# Patient Record
Sex: Male | Born: 1955
Health system: Southern US, Community
[De-identification: ages and names within clinical notes are randomized; demographics above are authoritative.]

## PROBLEM LIST (undated history)

## (undated) DIAGNOSIS — I480 Paroxysmal atrial fibrillation: Secondary | ICD-10-CM

## (undated) DIAGNOSIS — I428 Other cardiomyopathies: Secondary | ICD-10-CM

## (undated) DIAGNOSIS — I4892 Unspecified atrial flutter: Secondary | ICD-10-CM

## (undated) DIAGNOSIS — IMO0002 Reserved for concepts with insufficient information to code with codable children: Secondary | ICD-10-CM

## (undated) DIAGNOSIS — E039 Hypothyroidism, unspecified: Secondary | ICD-10-CM

## (undated) DIAGNOSIS — C099 Malignant neoplasm of tonsil, unspecified: Secondary | ICD-10-CM

## (undated) DIAGNOSIS — Z9221 Personal history of antineoplastic chemotherapy: Secondary | ICD-10-CM

## (undated) DIAGNOSIS — IMO0001 Reserved for inherently not codable concepts without codable children: Secondary | ICD-10-CM

## (undated) DIAGNOSIS — I2510 Atherosclerotic heart disease of native coronary artery without angina pectoris: Secondary | ICD-10-CM

## (undated) DIAGNOSIS — K5720 Diverticulitis of large intestine with perforation and abscess without bleeding: Secondary | ICD-10-CM

## (undated) DIAGNOSIS — I6529 Occlusion and stenosis of unspecified carotid artery: Secondary | ICD-10-CM

## (undated) DIAGNOSIS — K5792 Diverticulitis of intestine, part unspecified, without perforation or abscess without bleeding: Secondary | ICD-10-CM

## (undated) HISTORY — DX: Diverticulitis of intestine, part unspecified, without perforation or abscess without bleeding: K57.92

## (undated) HISTORY — DX: Occlusion and stenosis of unspecified carotid artery: I65.29

## (undated) HISTORY — DX: Personal history of antineoplastic chemotherapy: Z92.21

## (undated) HISTORY — DX: Hypothyroidism, unspecified: E03.9

## (undated) HISTORY — DX: Malignant neoplasm of tonsil, unspecified: C09.9

## (undated) HISTORY — DX: Reserved for inherently not codable concepts without codable children: IMO0001

## (undated) HISTORY — DX: Unspecified atrial flutter: I48.92

## (undated) HISTORY — DX: Atherosclerotic heart disease of native coronary artery without angina pectoris: I25.10

## (undated) HISTORY — DX: Reserved for concepts with insufficient information to code with codable children: IMO0002

## (undated) HISTORY — PX: LAMINECTOMY: SHX219

## (undated) HISTORY — DX: Other cardiomyopathies: I42.8

---
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
## 1999-04-04 ENCOUNTER — Encounter: Payer: Self-pay | Admitting: Neurosurgery

## 1999-04-06 ENCOUNTER — Observation Stay (HOSPITAL_COMMUNITY): Admission: RE | Admit: 1999-04-06 | Discharge: 1999-04-07 | Payer: Self-pay | Admitting: Neurosurgery

## 1999-04-06 ENCOUNTER — Encounter: Payer: Self-pay | Admitting: Neurosurgery

## 1999-04-28 ENCOUNTER — Encounter: Payer: Self-pay | Admitting: Neurosurgery

## 1999-04-28 ENCOUNTER — Ambulatory Visit (HOSPITAL_COMMUNITY): Admission: RE | Admit: 1999-04-28 | Discharge: 1999-04-28 | Payer: Self-pay | Admitting: Neurosurgery

## 1999-05-31 ENCOUNTER — Encounter: Payer: Self-pay | Admitting: Neurosurgery

## 1999-05-31 ENCOUNTER — Encounter: Admission: RE | Admit: 1999-05-31 | Discharge: 1999-05-31 | Payer: Self-pay | Admitting: Neurosurgery

## 1999-12-16 ENCOUNTER — Encounter: Payer: Self-pay | Admitting: Neurosurgery

## 1999-12-16 ENCOUNTER — Encounter: Admission: RE | Admit: 1999-12-16 | Discharge: 1999-12-16 | Payer: Self-pay | Admitting: Neurosurgery

## 2001-08-07 HISTORY — PX: NECK SURGERY: SHX720

## 2003-08-08 DIAGNOSIS — Z9221 Personal history of antineoplastic chemotherapy: Secondary | ICD-10-CM

## 2003-08-08 DIAGNOSIS — C099 Malignant neoplasm of tonsil, unspecified: Secondary | ICD-10-CM | POA: Diagnosis present

## 2003-08-08 HISTORY — DX: Personal history of antineoplastic chemotherapy: Z92.21

## 2003-08-08 HISTORY — DX: Malignant neoplasm of tonsil, unspecified: C09.9

## 2004-04-26 ENCOUNTER — Other Ambulatory Visit: Admission: RE | Admit: 2004-04-26 | Discharge: 2004-04-26 | Payer: Self-pay | Admitting: Otolaryngology

## 2004-05-02 ENCOUNTER — Ambulatory Visit (HOSPITAL_COMMUNITY): Admission: RE | Admit: 2004-05-02 | Discharge: 2004-05-02 | Payer: Self-pay | Admitting: Otolaryngology

## 2004-05-02 ENCOUNTER — Ambulatory Visit (HOSPITAL_BASED_OUTPATIENT_CLINIC_OR_DEPARTMENT_OTHER): Admission: RE | Admit: 2004-05-02 | Discharge: 2004-05-02 | Payer: Self-pay | Admitting: Otolaryngology

## 2004-05-02 ENCOUNTER — Encounter (INDEPENDENT_AMBULATORY_CARE_PROVIDER_SITE_OTHER): Payer: Self-pay | Admitting: *Deleted

## 2004-05-09 ENCOUNTER — Ambulatory Visit: Admission: RE | Admit: 2004-05-09 | Discharge: 2004-08-07 | Payer: Self-pay | Admitting: Radiation Oncology

## 2004-05-11 ENCOUNTER — Ambulatory Visit: Payer: Self-pay | Admitting: Dentistry

## 2004-05-11 ENCOUNTER — Encounter: Admission: RE | Admit: 2004-05-11 | Discharge: 2004-05-11 | Payer: Self-pay | Admitting: Dentistry

## 2004-05-16 ENCOUNTER — Ambulatory Visit: Payer: Self-pay | Admitting: Dentistry

## 2004-05-20 ENCOUNTER — Ambulatory Visit (HOSPITAL_COMMUNITY): Admission: RE | Admit: 2004-05-20 | Discharge: 2004-05-20 | Payer: Self-pay | Admitting: Radiation Oncology

## 2004-06-02 ENCOUNTER — Ambulatory Visit (HOSPITAL_COMMUNITY): Admission: RE | Admit: 2004-06-02 | Discharge: 2004-06-02 | Payer: Self-pay | Admitting: Internal Medicine

## 2004-06-08 ENCOUNTER — Ambulatory Visit: Payer: Self-pay | Admitting: Internal Medicine

## 2004-06-12 ENCOUNTER — Ambulatory Visit: Payer: Self-pay | Admitting: Internal Medicine

## 2004-07-04 ENCOUNTER — Ambulatory Visit (HOSPITAL_COMMUNITY): Admission: RE | Admit: 2004-07-04 | Discharge: 2004-07-04 | Payer: Self-pay | Admitting: Radiation Oncology

## 2004-07-04 HISTORY — PX: GASTROSTOMY TUBE PLACEMENT: SHX655

## 2004-07-19 ENCOUNTER — Ambulatory Visit: Payer: Self-pay | Admitting: Internal Medicine

## 2004-07-19 ENCOUNTER — Inpatient Hospital Stay (HOSPITAL_COMMUNITY): Admission: EM | Admit: 2004-07-19 | Discharge: 2004-07-20 | Payer: Self-pay | Admitting: Internal Medicine

## 2004-08-19 ENCOUNTER — Ambulatory Visit (HOSPITAL_COMMUNITY): Admission: RE | Admit: 2004-08-19 | Discharge: 2004-08-19 | Payer: Self-pay | Admitting: Internal Medicine

## 2004-08-22 ENCOUNTER — Ambulatory Visit: Payer: Self-pay | Admitting: Internal Medicine

## 2004-09-06 ENCOUNTER — Ambulatory Visit: Payer: Self-pay | Admitting: Dentistry

## 2004-09-13 ENCOUNTER — Ambulatory Visit (HOSPITAL_COMMUNITY): Admission: RE | Admit: 2004-09-13 | Discharge: 2004-09-13 | Payer: Self-pay | Admitting: Radiation Oncology

## 2004-09-14 ENCOUNTER — Ambulatory Visit (HOSPITAL_COMMUNITY): Admission: RE | Admit: 2004-09-14 | Discharge: 2004-09-14 | Payer: Self-pay | Admitting: Radiation Oncology

## 2004-09-20 ENCOUNTER — Ambulatory Visit: Admission: RE | Admit: 2004-09-20 | Discharge: 2004-09-20 | Payer: Self-pay | Admitting: Radiation Oncology

## 2004-09-28 ENCOUNTER — Ambulatory Visit (HOSPITAL_COMMUNITY): Admission: RE | Admit: 2004-09-28 | Discharge: 2004-09-28 | Payer: Self-pay | Admitting: Internal Medicine

## 2004-10-03 ENCOUNTER — Ambulatory Visit (HOSPITAL_COMMUNITY): Admission: RE | Admit: 2004-10-03 | Discharge: 2004-10-03 | Payer: Self-pay | Admitting: Otolaryngology

## 2004-10-03 ENCOUNTER — Ambulatory Visit (HOSPITAL_BASED_OUTPATIENT_CLINIC_OR_DEPARTMENT_OTHER): Admission: RE | Admit: 2004-10-03 | Discharge: 2004-10-03 | Payer: Self-pay | Admitting: Otolaryngology

## 2004-10-03 ENCOUNTER — Encounter (INDEPENDENT_AMBULATORY_CARE_PROVIDER_SITE_OTHER): Payer: Self-pay | Admitting: *Deleted

## 2004-10-18 ENCOUNTER — Ambulatory Visit: Admission: RE | Admit: 2004-10-18 | Discharge: 2004-10-18 | Payer: Self-pay | Admitting: Radiation Oncology

## 2004-10-21 ENCOUNTER — Ambulatory Visit (HOSPITAL_COMMUNITY): Admission: RE | Admit: 2004-10-21 | Discharge: 2004-10-21 | Payer: Self-pay | Admitting: Radiation Oncology

## 2004-10-26 ENCOUNTER — Ambulatory Visit: Admission: RE | Admit: 2004-10-26 | Discharge: 2004-10-26 | Payer: Self-pay | Admitting: Radiation Oncology

## 2004-11-30 ENCOUNTER — Ambulatory Visit: Admission: RE | Admit: 2004-11-30 | Discharge: 2004-11-30 | Payer: Self-pay | Admitting: Radiation Oncology

## 2004-12-21 ENCOUNTER — Ambulatory Visit: Payer: Self-pay | Admitting: Internal Medicine

## 2004-12-27 ENCOUNTER — Ambulatory Visit (HOSPITAL_COMMUNITY): Admission: RE | Admit: 2004-12-27 | Discharge: 2004-12-27 | Payer: Self-pay | Admitting: Internal Medicine

## 2004-12-28 ENCOUNTER — Ambulatory Visit: Admission: RE | Admit: 2004-12-28 | Discharge: 2004-12-28 | Payer: Self-pay | Admitting: Radiation Oncology

## 2004-12-29 ENCOUNTER — Ambulatory Visit (HOSPITAL_COMMUNITY): Admission: RE | Admit: 2004-12-29 | Discharge: 2004-12-29 | Payer: Self-pay | Admitting: Internal Medicine

## 2005-01-17 ENCOUNTER — Ambulatory Visit: Admission: RE | Admit: 2005-01-17 | Discharge: 2005-01-17 | Payer: Self-pay | Admitting: Radiation Oncology

## 2005-01-24 ENCOUNTER — Ambulatory Visit: Admission: RE | Admit: 2005-01-24 | Discharge: 2005-01-24 | Payer: Self-pay | Admitting: Radiation Oncology

## 2005-03-28 ENCOUNTER — Ambulatory Visit: Payer: Self-pay | Admitting: Internal Medicine

## 2005-03-29 ENCOUNTER — Ambulatory Visit (HOSPITAL_COMMUNITY): Admission: RE | Admit: 2005-03-29 | Discharge: 2005-03-29 | Payer: Self-pay | Admitting: Internal Medicine

## 2005-07-11 ENCOUNTER — Ambulatory Visit: Payer: Self-pay | Admitting: Internal Medicine

## 2005-07-12 ENCOUNTER — Ambulatory Visit (HOSPITAL_COMMUNITY): Admission: RE | Admit: 2005-07-12 | Discharge: 2005-07-12 | Payer: Self-pay | Admitting: Internal Medicine

## 2005-12-28 ENCOUNTER — Ambulatory Visit: Payer: Self-pay | Admitting: Internal Medicine

## 2006-01-03 ENCOUNTER — Ambulatory Visit (HOSPITAL_COMMUNITY): Admission: RE | Admit: 2006-01-03 | Discharge: 2006-01-03 | Payer: Self-pay | Admitting: Internal Medicine

## 2006-01-03 LAB — CBC WITH DIFFERENTIAL/PLATELET
Basophils Absolute: 0 10*3/uL (ref 0.0–0.1)
Eosinophils Absolute: 0.2 10*3/uL (ref 0.0–0.5)
HGB: 14.1 g/dL (ref 13.0–17.1)
MCV: 97.8 fL (ref 81.6–98.0)
MONO%: 12 % (ref 0.0–13.0)
NEUT#: 3 10*3/uL (ref 1.5–6.5)
RDW: 13.2 % (ref 11.2–14.6)

## 2006-01-03 LAB — COMPREHENSIVE METABOLIC PANEL
Albumin: 4.2 g/dL (ref 3.5–5.2)
Alkaline Phosphatase: 47 U/L (ref 39–117)
BUN: 14 mg/dL (ref 6–23)
CO2: 26 mEq/L (ref 19–32)
Calcium: 9 mg/dL (ref 8.4–10.5)
Chloride: 105 mEq/L (ref 96–112)
Glucose, Bld: 106 mg/dL — ABNORMAL HIGH (ref 70–99)
Potassium: 3.8 mEq/L (ref 3.5–5.3)

## 2006-06-29 ENCOUNTER — Ambulatory Visit: Payer: Self-pay | Admitting: Internal Medicine

## 2006-07-04 ENCOUNTER — Ambulatory Visit (HOSPITAL_COMMUNITY): Admission: RE | Admit: 2006-07-04 | Discharge: 2006-07-04 | Payer: Self-pay | Admitting: Internal Medicine

## 2006-07-04 LAB — CBC WITH DIFFERENTIAL/PLATELET
Basophils Absolute: 0 10*3/uL (ref 0.0–0.1)
EOS%: 5.2 % (ref 0.0–7.0)
HCT: 43.6 % (ref 38.7–49.9)
HGB: 15.3 g/dL (ref 13.0–17.1)
LYMPH%: 22.7 % (ref 14.0–48.0)
MCH: 34.4 pg — ABNORMAL HIGH (ref 28.0–33.4)
MCV: 97.8 fL (ref 81.6–98.0)
MONO%: 15.2 % — ABNORMAL HIGH (ref 0.0–13.0)
NEUT%: 56.3 % (ref 40.0–75.0)

## 2006-07-04 LAB — COMPREHENSIVE METABOLIC PANEL
AST: 22 U/L (ref 0–37)
Alkaline Phosphatase: 47 U/L (ref 39–117)
BUN: 21 mg/dL (ref 6–23)
Calcium: 9.4 mg/dL (ref 8.4–10.5)
Chloride: 105 mEq/L (ref 96–112)
Creatinine, Ser: 1.08 mg/dL (ref 0.40–1.50)

## 2006-12-28 ENCOUNTER — Ambulatory Visit: Payer: Self-pay | Admitting: Internal Medicine

## 2007-01-02 ENCOUNTER — Ambulatory Visit (HOSPITAL_COMMUNITY): Admission: RE | Admit: 2007-01-02 | Discharge: 2007-01-02 | Payer: Self-pay | Admitting: Internal Medicine

## 2007-01-02 LAB — CBC WITH DIFFERENTIAL/PLATELET
Basophils Absolute: 0 10*3/uL (ref 0.0–0.1)
Eosinophils Absolute: 0.1 10*3/uL (ref 0.0–0.5)
HGB: 14.5 g/dL (ref 13.0–17.1)
MONO#: 0.5 10*3/uL (ref 0.1–0.9)
NEUT#: 2.9 10*3/uL (ref 1.5–6.5)
RDW: 13 % (ref 11.2–14.6)
WBC: 4.5 10*3/uL (ref 4.0–10.0)
lymph#: 0.9 10*3/uL (ref 0.9–3.3)

## 2007-01-02 LAB — COMPREHENSIVE METABOLIC PANEL
Albumin: 4 g/dL (ref 3.5–5.2)
BUN: 15 mg/dL (ref 6–23)
Calcium: 9 mg/dL (ref 8.4–10.5)
Chloride: 107 mEq/L (ref 96–112)
Glucose, Bld: 93 mg/dL (ref 70–99)
Potassium: 3.9 mEq/L (ref 3.5–5.3)
Sodium: 138 mEq/L (ref 135–145)
Total Protein: 6.7 g/dL (ref 6.0–8.3)

## 2007-03-26 ENCOUNTER — Ambulatory Visit: Admission: RE | Admit: 2007-03-26 | Discharge: 2007-05-09 | Payer: Self-pay | Admitting: Radiation Oncology

## 2007-03-26 LAB — CBC WITH DIFFERENTIAL/PLATELET
Basophils Absolute: 0 10*3/uL (ref 0.0–0.1)
EOS%: 1.9 % (ref 0.0–7.0)
HGB: 15 g/dL (ref 13.0–17.1)
MCH: 34.6 pg — ABNORMAL HIGH (ref 28.0–33.4)
MONO%: 11 % (ref 0.0–13.0)
NEUT#: 3.3 10*3/uL (ref 1.5–6.5)
RBC: 4.34 10*6/uL (ref 4.20–5.71)
RDW: 13.3 % (ref 11.2–14.6)
lymph#: 1 10*3/uL (ref 0.9–3.3)

## 2007-03-26 LAB — THYROID PANEL WITH TSH - CHCC
T4, Total: 4.2 ug/dL — ABNORMAL LOW (ref 5.0–12.5)
TSH: 14.308 u[IU]/mL — ABNORMAL HIGH (ref 0.350–5.500)

## 2007-03-26 LAB — CMP AND LIVER
ALT: 17 U/L (ref 0–53)
AST: 18 U/L (ref 0–37)
Albumin: 4.4 g/dL (ref 3.5–5.2)
Alkaline Phosphatase: 50 U/L (ref 39–117)
BUN: 22 mg/dL (ref 6–23)
Calcium: 9.7 mg/dL (ref 8.4–10.5)
Chloride: 106 mEq/L (ref 96–112)
Potassium: 4.3 mEq/L (ref 3.5–5.3)
Sodium: 139 mEq/L (ref 135–145)
Total Protein: 7 g/dL (ref 6.0–8.3)

## 2007-06-06 ENCOUNTER — Ambulatory Visit: Payer: Self-pay | Admitting: Internal Medicine

## 2007-06-10 ENCOUNTER — Ambulatory Visit: Admission: RE | Admit: 2007-06-10 | Discharge: 2007-06-10 | Payer: Self-pay | Admitting: Radiation Oncology

## 2007-06-26 ENCOUNTER — Ambulatory Visit (HOSPITAL_COMMUNITY): Admission: RE | Admit: 2007-06-26 | Discharge: 2007-06-26 | Payer: Self-pay | Admitting: Internal Medicine

## 2007-06-26 LAB — CBC WITH DIFFERENTIAL/PLATELET
BASO%: 0.8 % (ref 0.0–2.0)
HCT: 43 % (ref 38.7–49.9)
HGB: 15.7 g/dL (ref 13.0–17.1)
LYMPH%: 26.6 % (ref 14.0–48.0)
MCH: 34.8 pg — ABNORMAL HIGH (ref 28.0–33.4)
MCV: 95.4 fL (ref 81.6–98.0)
NEUT#: 2.4 10*3/uL (ref 1.5–6.5)
Platelets: 304 10*3/uL (ref 145–400)
RDW: 13.1 % (ref 11.2–14.6)

## 2007-06-26 LAB — COMPREHENSIVE METABOLIC PANEL
ALT: 23 U/L (ref 0–53)
CO2: 30 mEq/L (ref 19–32)
Sodium: 140 mEq/L (ref 135–145)
Total Bilirubin: 1 mg/dL (ref 0.3–1.2)
Total Protein: 7 g/dL (ref 6.0–8.3)

## 2007-08-15 ENCOUNTER — Ambulatory Visit: Admission: RE | Admit: 2007-08-15 | Discharge: 2007-09-19 | Payer: Self-pay | Admitting: Radiation Oncology

## 2007-08-15 LAB — TSH: TSH: 12.684 u[IU]/mL — ABNORMAL HIGH (ref 0.350–5.500)

## 2007-12-12 ENCOUNTER — Ambulatory Visit: Admission: RE | Admit: 2007-12-12 | Discharge: 2007-12-12 | Payer: Self-pay | Admitting: Radiation Oncology

## 2008-04-27 ENCOUNTER — Ambulatory Visit: Admission: RE | Admit: 2008-04-27 | Discharge: 2008-04-27 | Payer: Self-pay | Admitting: Radiation Oncology

## 2008-06-29 ENCOUNTER — Ambulatory Visit: Payer: Self-pay | Admitting: Internal Medicine

## 2008-07-06 ENCOUNTER — Ambulatory Visit (HOSPITAL_COMMUNITY): Admission: RE | Admit: 2008-07-06 | Discharge: 2008-07-06 | Payer: Self-pay | Admitting: Internal Medicine

## 2008-07-06 LAB — COMPREHENSIVE METABOLIC PANEL
ALT: 25 U/L (ref 0–53)
AST: 24 U/L (ref 0–37)
Albumin: 4.1 g/dL (ref 3.5–5.2)
BUN: 21 mg/dL (ref 6–23)
Calcium: 9.3 mg/dL (ref 8.4–10.5)
Chloride: 104 mEq/L (ref 96–112)
Potassium: 4.1 mEq/L (ref 3.5–5.3)

## 2008-07-06 LAB — CBC WITH DIFFERENTIAL/PLATELET
BASO%: 0.6 % (ref 0.0–2.0)
Basophils Absolute: 0 10*3/uL (ref 0.0–0.1)
EOS%: 2.3 % (ref 0.0–7.0)
HGB: 15.8 g/dL (ref 13.0–17.1)
MCH: 34.2 pg — ABNORMAL HIGH (ref 28.0–33.4)
MONO#: 0.6 10*3/uL (ref 0.1–0.9)
RDW: 12.9 % (ref 11.2–14.6)
WBC: 3.7 10*3/uL — ABNORMAL LOW (ref 4.0–10.0)
lymph#: 1.3 10*3/uL (ref 0.9–3.3)

## 2008-08-31 ENCOUNTER — Ambulatory Visit: Admission: RE | Admit: 2008-08-31 | Discharge: 2008-08-31 | Payer: Self-pay | Admitting: Radiation Oncology

## 2008-08-31 LAB — TSH: TSH: 3.136 u[IU]/mL (ref 0.350–4.500)

## 2008-08-31 LAB — T4: T4, Total: 6.3 ug/dL (ref 5.0–12.5)

## 2009-06-29 ENCOUNTER — Ambulatory Visit: Payer: Self-pay | Admitting: Internal Medicine

## 2009-07-05 LAB — COMPREHENSIVE METABOLIC PANEL
Albumin: 3.6 g/dL (ref 3.5–5.2)
Alkaline Phosphatase: 43 U/L (ref 39–117)
BUN: 16 mg/dL (ref 6–23)
Creatinine, Ser: 0.94 mg/dL (ref 0.40–1.50)
Glucose, Bld: 91 mg/dL (ref 70–99)
Potassium: 4.1 mEq/L (ref 3.5–5.3)
Total Bilirubin: 0.7 mg/dL (ref 0.3–1.2)

## 2009-07-05 LAB — CBC WITH DIFFERENTIAL/PLATELET
Basophils Absolute: 0 10*3/uL (ref 0.0–0.1)
EOS%: 2.8 % (ref 0.0–7.0)
Eosinophils Absolute: 0.1 10*3/uL (ref 0.0–0.5)
HGB: 14.4 g/dL (ref 13.0–17.1)
LYMPH%: 24.5 % (ref 14.0–49.0)
MCH: 34.5 pg — ABNORMAL HIGH (ref 27.2–33.4)
MCV: 100.2 fL — ABNORMAL HIGH (ref 79.3–98.0)
MONO%: 12.2 % (ref 0.0–14.0)
NEUT#: 2.3 10*3/uL (ref 1.5–6.5)
NEUT%: 59.9 % (ref 39.0–75.0)
Platelets: 223 10*3/uL (ref 140–400)

## 2009-07-27 ENCOUNTER — Ambulatory Visit (HOSPITAL_COMMUNITY): Admission: RE | Admit: 2009-07-27 | Discharge: 2009-07-27 | Payer: Self-pay | Admitting: Internal Medicine

## 2009-08-05 ENCOUNTER — Ambulatory Visit: Payer: Self-pay | Admitting: Internal Medicine

## 2009-11-22 ENCOUNTER — Ambulatory Visit: Admission: RE | Admit: 2009-11-22 | Discharge: 2009-11-22 | Payer: Self-pay | Admitting: Radiation Oncology

## 2009-11-22 LAB — TSH: TSH: 1.58 u[IU]/mL (ref 0.350–4.500)

## 2010-08-27 ENCOUNTER — Encounter: Payer: Self-pay | Admitting: Internal Medicine

## 2010-08-28 ENCOUNTER — Encounter: Payer: Self-pay | Admitting: Internal Medicine

## 2010-10-24 ENCOUNTER — Ambulatory Visit: Payer: BC Managed Care – PPO | Attending: Radiation Oncology | Admitting: Radiation Oncology

## 2010-10-24 DIAGNOSIS — F17200 Nicotine dependence, unspecified, uncomplicated: Secondary | ICD-10-CM | POA: Insufficient documentation

## 2010-10-24 DIAGNOSIS — C099 Malignant neoplasm of tonsil, unspecified: Secondary | ICD-10-CM | POA: Insufficient documentation

## 2010-10-24 LAB — CBC WITH DIFFERENTIAL/PLATELET
BASO%: 0.1 % (ref 0.0–2.0)
Basophils Absolute: 0 10*3/uL (ref 0.0–0.1)
EOS%: 2.8 % (ref 0.0–7.0)
Eosinophils Absolute: 0.1 10*3/uL (ref 0.0–0.5)
HCT: 43.9 % (ref 38.4–49.9)
HGB: 15.4 g/dL (ref 13.0–17.1)
LYMPH%: 20.5 % (ref 14.0–49.0)
MCH: 35 pg — ABNORMAL HIGH (ref 27.2–33.4)
MCHC: 35.1 g/dL (ref 32.0–36.0)
MCV: 99.7 fL — ABNORMAL HIGH (ref 79.3–98.0)
MONO#: 0.5 10*3/uL (ref 0.1–0.9)
MONO%: 10.8 % (ref 0.0–14.0)
NEUT#: 3.3 10*3/uL (ref 1.5–6.5)
NEUT%: 65.8 % (ref 39.0–75.0)
Platelets: 236 10*3/uL (ref 140–400)
RBC: 4.41 10*6/uL (ref 4.20–5.82)
RDW: 12.8 % (ref 11.0–14.6)
WBC: 5 10*3/uL (ref 4.0–10.3)
lymph#: 1 10*3/uL (ref 0.9–3.3)

## 2010-10-24 LAB — COMPREHENSIVE METABOLIC PANEL
AST: 25 U/L (ref 0–37)
Alkaline Phosphatase: 54 U/L (ref 39–117)
BUN: 15 mg/dL (ref 6–23)
Glucose, Bld: 80 mg/dL (ref 70–99)
Potassium: 4.2 mEq/L (ref 3.5–5.3)
Sodium: 139 mEq/L (ref 135–145)
Total Bilirubin: 0.6 mg/dL (ref 0.3–1.2)

## 2010-10-26 ENCOUNTER — Ambulatory Visit (HOSPITAL_COMMUNITY)
Admission: RE | Admit: 2010-10-26 | Discharge: 2010-10-26 | Disposition: A | Discharge status: Home / Self Care | Payer: BC Managed Care – PPO | Source: Ambulatory Visit | Attending: Radiation Oncology | Admitting: Radiation Oncology

## 2010-10-26 ENCOUNTER — Other Ambulatory Visit: Payer: Self-pay | Admitting: Radiation Oncology

## 2010-10-26 DIAGNOSIS — C099 Malignant neoplasm of tonsil, unspecified: Secondary | ICD-10-CM | POA: Insufficient documentation

## 2010-12-23 NOTE — Op Note (Signed)
Adrian [name], SCHNICK                  ACCOUNT NO.:  [id number]   MEDICAL RECORD NO.:  [id number]          PATIENT TYPE:  AMB   LOCATION:  DSC                          FACILITY:  [facility]   PHYSICIAN:  Jefry H. Pollyann Kennedy, MD     DATE OF BIRTH:  Nov 23, 1955   DATE OF PROCEDURE:  10/03/2004  DATE OF DISCHARGE:                                 OPERATIVE REPORT   PREOPERATIVE DIAGNOSIS:  History of left tonsil carcinoma, status post  radiation therapy.   POSTOPERATIVE DIAGNOSIS:  History of left tonsil carcinoma, status post  radiation therapy.   PROCEDURE:  Direct laryngoscopy with biopsy of left tonsil.   SURGEON:  Jefry H. Pollyann Kennedy, M.D.   General endotracheal anesthesia was used, no complications.   FINDINGS:  Slight asymmetry of the tonsils, no superficial ulceration.  Slight firmness of the left tonsil.   SPECIMENS:  Multiple biopsies of left tonsil were sent for pathologic  evaluation.   No complications.   BLOOD LOSS:  None.   HISTORY:  A 55 year old gentleman who was diagnosed last year with a  carcinoma of the left tonsil.  He is approximately two months status post  completion of radiation therapy.  There is some residual activity on the PET  scan in the left tonsillar region.  There is also some asymmetry of the  tonsils on visual inspection.  The risks, benefits, alternatives,  complications of procedure were explained to the patient, who seemed to  understand and agreed to surgery.   PROCEDURE:  The patient was taken to the operating room and placed on the  operating table in supine position.  Following induction of general endotracheal anesthesia, the table was turned  and the patient was draped in a standard fashion.  A maxillary tooth  protector was used.  The anterior commissure scope was then used to view the  laryngeal, hypopharyngeal and oropharyngeal structures.  Palpation was also  used to palpate the left tonsil area.  The above-mentioned findings were  noted.   Multiple small biopsies were taken from deep within the left tonsil.  Topical adrenalin-soaked pledgets were then used for hemostasis.  The the  patient was then awakened, extubated and transferred to recovery in stable  condition.     JHR/MEDQ  D:  10/03/2004  T:  10/03/2004  Job:  [id number]

## 2010-12-23 NOTE — H&P (Signed)
Adrian [name], STATZER                  ACCOUNT NO.:  [id number]   MEDICAL RECORD NO.:  [id number]          PATIENT TYPE:  INP   LOCATION:  0253                         FACILITY:  [facility]   PHYSICIAN:  Lajuana Matte, MD  DATE OF BIRTH:  03-03-1956   DATE OF ADMISSION:  07/19/2004  DATE OF DISCHARGE:                                HISTORY & PHYSICAL   REASON FOR ADMISSION:  Neutropenic fever.   HISTORY:  Mr. Kean is a 55 year old white male diagnosed in September 2005  with a stage IVA (T2, N2b, MX) left tonsillar squamous cell carcinoma.  The  patient was started and he is currently on concurrent chemoradiation,  received a total of four weekly doses of cisplatin at 40 mg/sqm concurrent  with radiation treatment.  The patient missed the last two doses of his  chemotherapy because of significant pancytopenia.  Over the last 2 weeks, he  started to have gradual decrease in his white blood count and yesterday he  was seen in the clinic with a total white blood count of 700 and ANC of 200.  He was started on antibiotics with Levaquin, but today when he presented for  his radiation treatment, he was found to have low-grade fever of 100.1 and  significant weakness.  The patient also complained of some exudate and a  questionable pus coming around the exit site of his gastric feeding tube.  Otherwise, he denied having any significant complaint and he was admitted  for further evaluation and management.   REVIEW OF SYSTEMS:  Today, positive for fever, mild chills.  No headache,  blurring of vision or double vision.  He had no nausea, vomiting, abdominal  pain, diarrhea, constipation, melena, or hematochezia.  No dysuria,  hematuria, urgency, or increased frequency.  No chest pain, shortness of  breath, cough, or palpitations.  No musculoskeletal or neurological  abnormalities.   PAST MEDICAL HISTORY:  Significant for anterior cervical fusion at the C5-6  level.  The patient denied having  any history of diabetes mellitus,  hypertension, coronary artery disease, or stroke.   FAMILY HISTORY:  Mother diagnosed with skin cancer and emphysema.  Father  died with prostate cancer.   SOCIAL HISTORY:  He is married, has two children.  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  He has a history of smoking one pack per day  for over 20 years and the patient still sneaks a cigarette every now and  then.  He also has a history of heavy alcohol abuse in the past but now it  is occasional.  No history of drug abuse.   ALLERGIES:  No known drug allergies.   HOME MEDICATIONS:  1.  Hydrocodone p.r.n.  2.  Levaquin 500 mg p.o. daily - started only today.  3.  Compazine for nausea.   PHYSICAL EXAMINATION TODAY:  VITAL SIGNS:  His blood pressure was 95/57,  pulse 70, respiratory rate 20, temperature on admission was 98.0, oxygen  saturation 99% room air.  GENERAL:  Showed a very pleasant 55 year old white male awake, alert,  in no  acute distress.  HEENT:  Normocephalic, atraumatic.  Clear oropharynx.  NECK:  Supple.  No JVD, no thyromegaly or lymphadenopathy.  CHEST:  Clear to auscultation.  No wheeze, crackle, dullness to percussion.  CARDIOVASCULAR:  Normal S1, S2.  Regular rhythm and rate.  No murmur,  gallops, or rub.  ABDOMEN:  Soft, nontender, nondistended.  I did not notice any exudate or  pus around the feeding tube exit site.  EXTREMITIES:  Show no edema, 2+ pulses.  No cyanosis or clubbing.  NEUROLOGIC:  Shows no focal, sensory, or motor deficit.   LABORATORY DATA:  Pending.   ASSESSMENT:  This is a 55 year old white male diagnosed with stage IVA left  tonsillar carcinoma, presenting today with neutropenic fever.   PLAN:  1.  For neutropenic fever, will check blood culture x2, check urinalysis and      culture.  Will check culture of any exudate coming around the big tube      exit site.  I will check chest x-ray today.  I will continue the patient      on  Neupogen 300 mcg subcu daily.  He will be started on cefepime 2 g IV      q.12h.  The patient will be on neutropenic diet and precautions.  2.  For stage IVa left tonsillar carcinoma, will continue radiation therapy      as scheduled.  3.  For pain control, the patient will be on oxycodone p.r.n.  4.  For nutrition, he will continue on clear liquid and I will consult      nutrition for evaluation of his feeding tube supplement.     Moha   MKM/MEDQ  D:  07/19/2004  T:  07/19/2004  Job:  [id number]   cc:   Jeannett Senior. Pollyann Kennedy, MD  [phone] [place]  [place] [address]  Fax: [phone]   Billie Lade, M.D.  [address] - [facility]  [place]  [place] [address]  Fax: [phone]

## 2010-12-23 NOTE — Op Note (Signed)
NAMEDERRIEN, ANSCHUTZ                  ACCOUNT NO.:  [id number]   MEDICAL RECORD NO.:  [id number]          PATIENT TYPE:  AMB   LOCATION:  DSC                          FACILITY:  [facility]   PHYSICIAN:  Jefry H. Pollyann Kennedy, M.D.   DATE OF BIRTH:  1956-05-26   DATE OF PROCEDURE:  05/02/2004  DATE OF DISCHARGE:                                 OPERATIVE REPORT   PREOPERATIVE DIAGNOSIS:  Left tonsillar fossa and left neck mass.   POSTOPERATIVE DIAGNOSIS:  Left tonsillar fossa and left neck mass.   OPERATION PERFORMED:  1.  Direct laryngoscopy and biopsy.  2.  Esophagoscopy.   SURGEON:  Reinaldo Raddle. Lance Bosch, M.D.   ANESTHESIA:  General.   INDICATIONS FOR PROCEDURE:  The patient is a 55 year old with a history of  approximately two years of a slowly enlarging mass on the left side of the  neck.  Needle aspiration biopsy was suspicious for but not diagnostic of  squamous cell carcinoma.  Preoperative CT scan revealed a possible tonsillar  mass as did the office examination.  The risks, benefits, alternatives and  complications of the procedure were explained to the patient, who seemed to  understand and agreed to surgery.   DESCRIPTION OF PROCEDURE:  The patient was taken to the operating room and  placed on the operating table in supine position.  Following induction of  general endotracheal anesthesia, the table was turned and the patient was  draped in the standard fashion.   1 - Esophagoscopy.  A rigid cervical esophagoscope was entered into the oral  cavity through the cricopharyngeus and into the upper esophagus.  It was  slowly withdrawn while carefully inspecting the circumferential mucosa of  the esophagus.  There were no lesions noted.  2 - Direct laryngoscopy with biopsy.  Direct laryngoscopy was performed  using an anterior commissure laryngoscope.  The larynx and hypopharynx were  completely unremarkable.  The pharynx was significant for a large exophytic  mass involving the right  tonsillar fossa.  It did not appear to extend into  the base of tongue, nasopharynx  or posterior pharyngeal wall.  There was no involvement of the soft palate  either.  Multiple biopsies were taken and sent for pathologic evaluation.  This was consistent with squamous cell carcinoma on frozen section.  The  patient was then awakened, extubated and transferred to recovery in stable  condition.       JHR/MEDQ  D:  05/02/2004  T:  05/02/2004  Job:  [id number]   cc:   Reinaldo Raddle. Lance Bosch, M.D.  [facility]  [address]  [place]  [place] [address]  Fax: [phone]

## 2011-05-29 ENCOUNTER — Ambulatory Visit
Admission: RE | Admit: 2011-05-29 | Discharge: 2011-05-29 | Disposition: A | Discharge status: Home / Self Care | Payer: BC Managed Care – PPO | Source: Ambulatory Visit | Attending: Radiation Oncology | Admitting: Radiation Oncology

## 2011-05-29 DIAGNOSIS — F17200 Nicotine dependence, unspecified, uncomplicated: Secondary | ICD-10-CM | POA: Insufficient documentation

## 2011-05-29 DIAGNOSIS — C099 Malignant neoplasm of tonsil, unspecified: Secondary | ICD-10-CM | POA: Insufficient documentation

## 2011-05-29 DIAGNOSIS — E039 Hypothyroidism, unspecified: Secondary | ICD-10-CM | POA: Insufficient documentation

## 2011-11-24 ENCOUNTER — Encounter: Payer: Self-pay | Admitting: Radiation Oncology

## 2011-11-27 ENCOUNTER — Encounter: Payer: Self-pay | Admitting: Radiation Oncology

## 2011-11-27 ENCOUNTER — Ambulatory Visit (HOSPITAL_BASED_OUTPATIENT_CLINIC_OR_DEPARTMENT_OTHER)
Admission: RE | Admit: 2011-11-27 | Discharge: 2011-11-27 | Disposition: A | Discharge status: Home / Self Care | Payer: BC Managed Care – PPO | Source: Ambulatory Visit | Attending: Radiation Oncology | Admitting: Radiation Oncology

## 2011-11-27 ENCOUNTER — Ambulatory Visit
Admission: RE | Admit: 2011-11-27 | Discharge: 2011-11-27 | Disposition: A | Discharge status: Home / Self Care | Payer: BC Managed Care – PPO | Source: Ambulatory Visit | Attending: Radiation Oncology | Admitting: Radiation Oncology

## 2011-11-27 VITALS — BP 125/83 | HR 77 | Temp 97.8°F | Wt 153.5 lb

## 2011-11-27 DIAGNOSIS — C140 Malignant neoplasm of pharynx, unspecified: Secondary | ICD-10-CM

## 2011-11-27 DIAGNOSIS — C090 Malignant neoplasm of tonsillar fossa: Secondary | ICD-10-CM | POA: Insufficient documentation

## 2011-11-27 NOTE — Progress Notes (Signed)
Patient here for routine follow up post tonsillar ca. Completed treatment in 2005. Denies pain. Increased wt. Gain.No problems/concerns voiced. Takes synthroid, not sure of dosage.

## 2011-11-27 NOTE — Progress Notes (Signed)
CC:   Adrian H. Pollyann Kennedy, MD Lajuana Matte, M.D. Cindra Eves, D.D.S.  DIAGNOSIS:  Tonsillar carcinoma.  INTERVAL SINCE RADIATION THERAPY:  Seven years and 4 months.  NARRATIVE:  Adrian [name] comes in today for routine followup.  He completed definitive radiosensitizing chemoradiation therapy back in December of 2005.  The patient continues to follow up in radiation oncology for his hypothyroidism.  The patient is on Synthroid 125 mcg daily.  The patient overall seems to have improvement in his strength and stamina.  He continues to work 2 jobs.  The patient denies any odynophagia, dysphagia.  His taste is good.  The patient denies any cough or breathing problems.  The patient has any headaches, dizziness or blurred vision.  PHYSICAL EXAMINATION:  General:  This is a healthy appearing 56 year old gentleman in no acute distress.  Vital signs:  Temperature 97.8, pulse 77, respirations 20, blood pressure is 125/83.  Weight is 153 pounds which is up 3 pounds since his weighing in October of last year.  Lungs: Examination of the lungs reveals them to be clear.  Heart:  The heart has a regular rhythm and rate.  Examination of the neck and supraclavicular region reveals no evidence of adenopathy.  The axillary areas are free of adenopathy.  Examination of the oral cavity and posterior oropharynx reveals the mucosa to be moist without secondary infection or mucosal lesion.  The patient proceeded to undergo indirect mirror examination.  There were no lesions noted along the tonsillar or base of tongue region.  The patient's vocal cords moved well on exam.  Palpation along the base of tongue and tonsillar area revealed no suspicious induration.  IMPRESSION AND PLAN:  Clinically NED (no evidence of disease).  The patient will undergo TSH blood work today and follow up in 6 months.    ______________________________ Billie Lade, Ph.D., M.D. JDK/MEDQ  D:  11/27/2011  T:  11/27/2011   Job:  [id number]

## 2012-05-17 ENCOUNTER — Other Ambulatory Visit: Payer: Self-pay | Admitting: Radiation Oncology

## 2012-05-29 ENCOUNTER — Encounter: Payer: Self-pay | Admitting: Radiation Oncology

## 2012-05-30 ENCOUNTER — Ambulatory Visit
Admission: RE | Admit: 2012-05-30 | Discharge: 2012-05-30 | Disposition: A | Discharge status: Home / Self Care | Payer: BC Managed Care – PPO | Source: Ambulatory Visit | Attending: Radiation Oncology | Admitting: Radiation Oncology

## 2012-05-30 ENCOUNTER — Telehealth: Payer: Self-pay | Admitting: *Deleted

## 2012-05-30 ENCOUNTER — Encounter: Payer: Self-pay | Admitting: Radiation Oncology

## 2012-05-30 VITALS — BP 124/74 | HR 69 | Temp 97.9°F | Resp 20 | Ht 68.0 in | Wt 150.6 lb

## 2012-05-30 DIAGNOSIS — F17200 Nicotine dependence, unspecified, uncomplicated: Secondary | ICD-10-CM

## 2012-05-30 DIAGNOSIS — C090 Malignant neoplasm of tonsillar fossa: Secondary | ICD-10-CM

## 2012-05-30 DIAGNOSIS — Z923 Personal history of irradiation: Secondary | ICD-10-CM

## 2012-05-30 LAB — TSH: TSH: 2.742 u[IU]/mL (ref 0.350–4.500)

## 2012-05-30 NOTE — Progress Notes (Signed)
Patient here for follow up post completion of radiation on July 21, 2004 for tonsillar cancer.Has been doing well, no changes in health status.Continues to take synthroid 125 mcg and will need refill today.TSH drawn this morning prior to this appointment.

## 2012-05-30 NOTE — Progress Notes (Signed)
  Radiation Oncology         [phone] ________________________________  Name: Adrian [name] MRN: [id number]  Date: 05/30/2012  DOB: 1955-12-25  Follow-Up Visit Note  CC: No primary provider on file.  Serena Colonel, MD  Diagnosis:   Stage IV A squamous cell carcinoma of the left tonsil  Interval Since Last Radiation:  7 years and 10 months  Narrative:  The patient returns today for routine follow-up.  He clinically seems to be doing well at this time. He voices no new complaints. He has good energy level. He continues to work 2 jobs and is quite busy.  He denies any swallowing problems or pain with swallowing. Patient denies the ear pain.  Patient unfortunately continues to smoke tobacco products. I have cautioned him on this issue and offered smoking cessation classes.   I also discussed with him the importance of obtaining a primary care physician and to proceed with general screening studies such as colonoscopy. He does see more willing to  obtain a primary  care physician and will make referral to Dr. Edwin Cap.  He prior to this time has sought out urgent medical care issues with one of  the local urgent care centers.                         ALLERGIES:   has no known allergies.  Meds: Current Outpatient Prescriptions  Medication Sig Dispense Refill  . levothyroxine (SYNTHROID, LEVOTHROID) 125 MCG tablet Take 125 mcg by mouth daily.        Physical Findings: The patient is in no acute distress. Patient is alert and oriented.  height is 5\' 8"  (1.727 m) and weight is 150 lb 9.6 oz (68.312 kg). His oral temperature is 97.9 F (36.6 C). His blood pressure is 124/74 and his pulse is 69. His respiration is 20. .  The lungs are clear to auscultation. The heart has a regular rhythm and rate. There is no palpable cervical supraclavicular or axillary adenopathy.  The oral cavity reveals no mucosal lesion or secondary infection. Examination of the pharynx/tonsillar area and base of tongue  area shows no  lesions or suspicious induration with palpation. Patient proceeded to undergo a indirect mirror examination. The vocal cords moved well on examination. There were no mucosal lesions noted in the laryngeal area.  Lab Findings: Lab Results  Component Value Date   WBC 5.0 10/24/2010   HGB 15.4 10/24/2010   HCT 43.9 10/24/2010   MCV 99.7* 10/24/2010   PLT 236 10/24/2010    @LASTCHEM @  Radiographic Findings: No results found.  Impression:  No evidence of recurrence on clinical exam today.  Plan:  Referral to Dr. Edwin Cap for primary care issues. Patient returns for routine followup in 6 months for thyroid function studies and clinical exam. If the patient does establish a primary care physician,  I will likely defer further followup since he is almost 8 years out from his diagnosis and radiation treatments.  _____________________________________   Billie Lade, PhD, MD

## 2012-05-30 NOTE — Progress Notes (Signed)
                        [id number]           

## 2012-05-30 NOTE — Telephone Encounter (Signed)
CALLED PATIENT TO INFORM OF APPT. WITH DR. Caryl Never ON NOV. 14- ARRIVAL TIME - 10:30 AM, SPOKE WITH PATIENT AND HE IS AWARE OF THIS APPT.

## 2012-05-31 ENCOUNTER — Telehealth: Payer: Self-pay

## 2012-05-31 NOTE — Telephone Encounter (Signed)
Patient informed of TSH results of 2.742 and to continue with same dosage of synthroid as before.

## 2012-06-20 ENCOUNTER — Ambulatory Visit (INDEPENDENT_AMBULATORY_CARE_PROVIDER_SITE_OTHER): Payer: BC Managed Care – PPO | Admitting: Family Medicine

## 2012-06-20 ENCOUNTER — Encounter: Payer: Self-pay | Admitting: Family Medicine

## 2012-06-20 VITALS — BP 120/70 | HR 72 | Temp 97.6°F | Resp 12 | Ht 68.5 in | Wt 153.0 lb

## 2012-06-20 DIAGNOSIS — Z23 Encounter for immunization: Secondary | ICD-10-CM

## 2012-06-20 DIAGNOSIS — Z0000 Encounter for general adult medical examination without abnormal findings: Secondary | ICD-10-CM

## 2012-06-20 LAB — POCT URINALYSIS DIPSTICK
Bilirubin, UA: NEGATIVE
Glucose, UA: NEGATIVE
Ketones, UA: NEGATIVE
Leukocytes, UA: NEGATIVE
pH, UA: 7

## 2012-06-20 LAB — LIPID PANEL
HDL: 59.1 mg/dL (ref >39.00)
Total CHOL/HDL Ratio: 4
Triglycerides: 119 mg/dL (ref 0.0–149.0)
VLDL: 23.8 mg/dL (ref 0.0–40.0)

## 2012-06-20 LAB — HEPATIC FUNCTION PANEL
ALT: 21 U/L (ref 0–53)
AST: 22 U/L (ref 0–37)
Bilirubin, Direct: 0.1 mg/dL (ref 0.0–0.3)
Total Bilirubin: 0.7 mg/dL (ref 0.3–1.2)

## 2012-06-20 LAB — BASIC METABOLIC PANEL
CO2: 27 mEq/L (ref 19–32)
Chloride: 103 mEq/L (ref 96–112)
Creatinine, Ser: 0.8 mg/dL (ref 0.4–1.5)
Potassium: 4 mEq/L (ref 3.5–5.1)
Sodium: 138 mEq/L (ref 135–145)

## 2012-06-20 LAB — CBC WITH DIFFERENTIAL/PLATELET
Eosinophils Relative: 1.3 % (ref 0.0–5.0)
Lymphocytes Relative: 23 % (ref 12.0–46.0)
Monocytes Relative: 9.1 % (ref 3.0–12.0)
Neutrophils Relative %: 66.1 % (ref 43.0–77.0)
Platelets: 248 10*3/uL (ref 150.0–400.0)
WBC: 6 10*3/uL (ref 4.5–10.5)

## 2012-06-20 MED ORDER — TETANUS-DIPHTH-ACELL PERTUSSIS 5-2.5-18.5 LF-MCG/0.5 IM SUSP: 0.5000 mL | Freq: Once | INTRAMUSCULAR | Status: DC

## 2012-06-20 NOTE — Progress Notes (Signed)
  Subjective:    Patient ID: Adrian [name], male    DOB: [birth year], 56 y.o.   MRN: [id number]  HPI  Patient here to establish care and for complete physical. Past medical history is that he had squamous cell tonsillar cancer back in 2005 treated with radiation and chemotherapy. He still sees radiation oncologist. No evidence recurrence. He states this was stage IV cancer. He has not had any recent lymphadenopathy. He had neck surgery 2003 for cervical disc herniation. He smokes about half a pack of cigarettes per day. Is trying to transition to electronic cigarettes to quit on his own. He's never had screening colonoscopy. Has never had a complete physical in many years.  Last tetanus unknown. Declines flu vaccine. No history of Pneumovax. Patient is divorced. 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 He has 2 children.  Family history significant for father with prostate cancer.    Review of Systems  Constitutional: Negative for fever, activity change, appetite change, fatigue and unexpected weight change.  HENT: Negative for ear pain, congestion and trouble swallowing.   Eyes: Negative for pain and visual disturbance.  Respiratory: Negative for cough, shortness of breath and wheezing.   Cardiovascular: Negative for chest pain and palpitations.  Gastrointestinal: Negative for nausea, vomiting, abdominal pain, diarrhea, constipation, blood in stool, abdominal distention and rectal pain.  Genitourinary: Negative for dysuria, hematuria and testicular pain.  Musculoskeletal: Negative for joint swelling and arthralgias.  Skin: Negative for rash.  Neurological: Negative for dizziness, syncope and headaches.  Hematological: Negative for adenopathy.  Psychiatric/Behavioral: Negative for confusion and dysphoric mood.       Objective:   Physical Exam  Constitutional: He is oriented to person, place, and time. He appears well-developed and  well-nourished. No distress.  HENT:  Head: Normocephalic and atraumatic.  Right Ear: External ear normal.  Left Ear: External ear normal.  Mouth/Throat: Oropharynx is clear and moist.  Eyes: Conjunctivae normal and EOM are normal. Pupils are equal, round, and reactive to light.  Neck: Normal range of motion. Neck supple. No thyromegaly present.  Cardiovascular: Normal rate, regular rhythm and normal heart sounds.   No murmur heard. Pulmonary/Chest: No respiratory distress. He has no wheezes. He has no rales.  Abdominal: Soft. Bowel sounds are normal. He exhibits no distension and no mass. There is no tenderness. There is no rebound and no guarding.       Patient has umbilication and scar from remote history of feeding tube  Genitourinary: Rectum normal and prostate normal.  Musculoskeletal: He exhibits no edema.  Lymphadenopathy:    He has no cervical adenopathy.  Neurological: He is alert and oriented to person, place, and time. He displays normal reflexes. No cranial nerve deficit.  Skin: No rash noted.  Psychiatric: He has a normal mood and affect.          Assessment & Plan:  Complete physical. Remote history of left tonsillar cancer. This was over 8 years ago. Ongoing nicotine use. Discussed smoking cessation. Tetanus booster given. Pneumovax given. Patient declines flu vaccine. Schedule screening colonoscopy. Set up screening lab work. We'll not check TSH as this was done recently.

## 2012-06-20 NOTE — Patient Instructions (Signed)
Smoking Cessation Quitting smoking is important to your health and has many advantages. However, it is not always easy to quit since nicotine is a very addictive drug. Often times, people try 3 times or more before being able to quit. This document explains the best ways for you to prepare to quit smoking. Quitting takes hard work and a lot of effort, but you can do it. ADVANTAGES OF QUITTING SMOKING  You will live longer, feel better, and live better.  Your body will feel the impact of quitting smoking almost immediately.  Within 20 minutes, blood pressure decreases. Your pulse returns to its normal level.  After 8 hours, carbon monoxide levels in the blood return to normal. Your oxygen level increases.  After 24 hours, the chance of having a heart attack starts to decrease. Your breath, hair, and body stop smelling like smoke.  After 48 hours, damaged nerve endings begin to recover. Your sense of taste and smell improve.  After 72 hours, the body is virtually free of nicotine. Your bronchial tubes relax and breathing becomes easier.  After 2 to 12 weeks, lungs can hold more air. Exercise becomes easier and circulation improves.  The risk of having a heart attack, stroke, cancer, or lung disease is greatly reduced.  After 1 year, the risk of coronary heart disease is cut in half.  After 5 years, the risk of stroke falls to the same as a nonsmoker.  After 10 years, the risk of lung cancer is cut in half and the risk of other cancers decreases significantly.  After 15 years, the risk of coronary heart disease drops, usually to the level of a nonsmoker.  If you are pregnant, quitting smoking will improve your chances of having a healthy baby.  The people you live with, especially any children, will be healthier.  You will have extra money to spend on things other than cigarettes. QUESTIONS TO THINK ABOUT BEFORE ATTEMPTING TO QUIT You may want to talk about your answers with your  caregiver.  Why do you want to quit?  If you tried to quit in the past, what helped and what did not?  What will be the most difficult situations for you after you quit? How will you plan to handle them?  Who can help you through the tough times? Your family? Friends? A caregiver?  What pleasures do you get from smoking? What ways can you still get pleasure if you quit? Here are some questions to ask your caregiver:  How can you help me to be successful at quitting?  What medicine do you think would be best for me and how should I take it?  What should I do if I need more help?  What is smoking withdrawal like? How can I get information on withdrawal? GET READY  Set a quit date.  Change your environment by getting rid of all cigarettes, ashtrays, matches, and lighters in your home, car, or work. Do not let people smoke in your home.  Review your past attempts to quit. Think about what worked and what did not. GET SUPPORT AND ENCOURAGEMENT You have a better chance of being successful if you have help. You can get support in many ways.  Tell your family, friends, and co-workers that you are going to quit and need their support. Ask them not to smoke around you.  Get individual, group, or telephone counseling and support. Programs are available at local hospitals and health centers. Call your local health department for   information about programs in your area.  Spiritual beliefs and practices may help some smokers quit.  Download a "quit meter" on your computer to keep track of quit statistics, such as how long you have gone without smoking, cigarettes not smoked, and money saved.  Get a self-help book about quitting smoking and staying off of tobacco. LEARN NEW SKILLS AND BEHAVIORS  Distract yourself from urges to smoke. Talk to someone, go for a walk, or occupy your time with a task.  Change your normal routine. Take a different route to work. Drink tea instead of coffee.  Eat breakfast in a different place.  Reduce your stress. Take a hot bath, exercise, or read a book.  Plan something enjoyable to do every day. Reward yourself for not smoking.  Explore interactive web-based programs that specialize in helping you quit. GET MEDICINE AND USE IT CORRECTLY Medicines can help you stop smoking and decrease the urge to smoke. Combining medicine with the above behavioral methods and support can greatly increase your chances of successfully quitting smoking.  Nicotine replacement therapy helps deliver nicotine to your body without the negative effects and risks of smoking. Nicotine replacement therapy includes nicotine gum, lozenges, inhalers, nasal sprays, and skin patches. Some may be available over-the-counter and others require a prescription.  Antidepressant medicine helps people abstain from smoking, but how this works is unknown. This medicine is available by prescription.  Nicotinic receptor partial agonist medicine simulates the effect of nicotine in your brain. This medicine is available by prescription. Ask your caregiver for advice about which medicines to use and how to use them based on your health history. Your caregiver will tell you what side effects to look out for if you choose to be on a medicine or therapy. Carefully read the information on the package. Do not use any other product containing nicotine while using a nicotine replacement product.  RELAPSE OR DIFFICULT SITUATIONS Most relapses occur within the first 3 months after quitting. Do not be discouraged if you start smoking again. Remember, most people try several times before finally quitting. You may have symptoms of withdrawal because your body is used to nicotine. You may crave cigarettes, be irritable, feel very hungry, cough often, get headaches, or have difficulty concentrating. The withdrawal symptoms are only temporary. They are strongest when you first quit, but they will go away within  10 14 days. To reduce the chances of relapse, try to:  Avoid drinking alcohol. Drinking lowers your chances of successfully quitting.  Reduce the amount of caffeine you consume. Once you quit smoking, the amount of caffeine in your body increases and can give you symptoms, such as a rapid heartbeat, sweating, and anxiety.  Avoid smokers because they can make you want to smoke.  Do not let weight gain distract you. Many smokers will gain weight when they quit, usually less than 10 pounds. Eat a healthy diet and stay active. You can always lose the weight gained after you quit.  Find ways to improve your mood other than smoking. FOR MORE INFORMATION  www.smokefree.gov  Document Released: 07/18/2001 Document Revised: 01/23/2012 Document Reviewed: 11/02/2011 [employer] Patient Information 2013 [employer].  

## 2012-06-21 ENCOUNTER — Encounter: Payer: Self-pay | Admitting: Family Medicine

## 2012-06-21 ENCOUNTER — Encounter: Payer: Self-pay | Admitting: Internal Medicine

## 2012-06-21 NOTE — Progress Notes (Signed)
Quick Note:  Pt informed on personally identified VM ______ 

## 2012-08-16 ENCOUNTER — Ambulatory Visit (AMBULATORY_SURGERY_CENTER): Payer: BC Managed Care – PPO | Admitting: *Deleted

## 2012-08-16 ENCOUNTER — Encounter: Payer: Self-pay | Admitting: Internal Medicine

## 2012-08-16 VITALS — Ht 68.0 in | Wt 150.0 lb

## 2012-08-16 DIAGNOSIS — Z1211 Encounter for screening for malignant neoplasm of colon: Secondary | ICD-10-CM

## 2012-08-16 MED ORDER — MOVIPREP 100 G PO SOLR: ORAL | Status: DC

## 2012-08-26 ENCOUNTER — Encounter: Payer: Self-pay | Admitting: Internal Medicine

## 2012-08-26 ENCOUNTER — Ambulatory Visit (AMBULATORY_SURGERY_CENTER): Payer: BC Managed Care – PPO | Admitting: Internal Medicine

## 2012-08-26 VITALS — BP 131/77 | HR 65 | Temp 97.8°F | Resp 20 | Ht 68.0 in | Wt 150.0 lb

## 2012-08-26 DIAGNOSIS — Z1211 Encounter for screening for malignant neoplasm of colon: Secondary | ICD-10-CM

## 2012-08-26 DIAGNOSIS — D126 Benign neoplasm of colon, unspecified: Secondary | ICD-10-CM

## 2012-08-26 MED ORDER — SODIUM CHLORIDE 0.9 % IV SOLN: 500.0000 mL | INTRAVENOUS | Status: DC

## 2012-08-26 NOTE — Op Note (Signed)
[facility] [address] [place], [address]   COLONOSCOPY PROCEDURE REPORT  PATIENT: Adrian, [name]  MR#: [id number] BIRTHDATE: 1956/06/14 , 56  yrs. old GENDER: Male ENDOSCOPIST: Roxy Cedar, MD REFERRED MW:NUUVO Burchette, M.D. PROCEDURE DATE:  08/26/2012 PROCEDURE:   Colonoscopy with snare polypectomy    x 2 ASA CLASS:   Class II INDICATIONS:average risk screening. MEDICATIONS: MAC sedation, administered by CRNA and propofol (Diprivan) 230mg  IV  DESCRIPTION OF PROCEDURE:   After the risks benefits and alternatives of the procedure were thoroughly explained, informed consent was obtained.  A digital rectal exam revealed no abnormalities of the rectum.   The LB CF-[id number]  endoscope was introduced through the anus and advanced to the cecum, which was identified by both the appendix and ileocecal valve. No adverse events experienced.   The quality of the prep was excellent, using MoviPrep  The instrument was then slowly withdrawn as the colon was fully examined.      COLON FINDINGS: Two polyps ranging between 3-73mm in size were found in the sigmoid colon and rectum.  A polypectomy was performed with a cold snare.  The resection was complete and the polyp tissue was completely retrieved.   Moderate diverticulosis was noted The finding was in the left colon.   The colon mucosa was otherwise normal.  Retroflexed views revealed internal hemorrhoids. The time to cecum=3 minutes 46 seconds.  Withdrawal time=12 minutes 12 seconds.  The scope was withdrawn and the procedure completed. COMPLICATIONS: There were no complications.  ENDOSCOPIC IMPRESSION: 1.   Two polyps ranging between 3-54mm in size were found in the sigmoid colon and rectum; polypectomy was performed with a cold snare 2.   Moderate diverticulosis was noted in the left colon 3.   The colon mucosa was otherwise normal  RECOMMENDATIONS: 1. Repeat colonoscopy in 5 years if polyp  adenomatous; otherwise 10 years   eSigned:  Roxy Cedar, MD 08/26/2012 9:07 AM  cc: Evelena Peat, MD and The Patient   PATIENT NAME:  Adrian, [name] MR#: [id number]

## 2012-08-26 NOTE — Progress Notes (Signed)
Patient did not experience any of the following events: a burn prior to discharge; a fall within the facility; wrong site/side/patient/procedure/implant event; or a hospital transfer or hospital admission upon discharge from the facility. (G8907) Patient did not have preoperative order for IV antibiotic SSI prophylaxis. (G8918)  

## 2012-08-26 NOTE — Patient Instructions (Signed)
YOU HAD AN ENDOSCOPIC PROCEDURE TODAY AT THE [facility]: Refer to the procedure report that was given to you for any specific questions about what was found during the examination.  If the procedure report does not answer your questions, please call your gastroenterologist to clarify.  If you requested that your care partner not be given the details of your procedure findings, then the procedure report has been included in a sealed envelope for you to review at your convenience later.  YOU SHOULD EXPECT: Some feelings of bloating in the abdomen. Passage of more gas than usual.  Walking can help get rid of the air that was put into your GI tract during the procedure and reduce the bloating. If you had a lower endoscopy (such as a colonoscopy or flexible sigmoidoscopy) you may notice spotting of blood in your stool or on the toilet paper. If you underwent a bowel prep for your procedure, then you may not have a normal bowel movement for a few days.  DIET: Your first meal following the procedure should be a light meal and then it is ok to progress to your normal diet.  A half-sandwich or bowl of soup is an example of a good first meal.  Heavy or fried foods are harder to digest and may make you feel nauseous or bloated.  Likewise meals heavy in dairy and vegetables can cause extra gas to form and this can also increase the bloating.  Drink plenty of fluids but you should avoid alcoholic beverages for 24 hours.  ACTIVITY: Your care partner should take you home directly after the procedure.  You should plan to take it easy, moving slowly for the rest of the day.  You can resume normal activity the day after the procedure however you should NOT DRIVE or use heavy machinery for 24 hours (because of the sedation medicines used during the test).    SYMPTOMS TO REPORT IMMEDIATELY: A gastroenterologist can be reached at any hour.  During normal business hours, 8:30 AM to 5:00 PM Monday through Friday,  call [phone].  After hours and on weekends, please call the GI answering service at [phone] who will take a message and have the physician on call contact you.   Following lower endoscopy (colonoscopy or flexible sigmoidoscopy):  Excessive amounts of blood in the stool  Significant tenderness or worsening of abdominal pains  Swelling of the abdomen that is new, acute  Fever of 100F or higher    FOLLOW UP: If any biopsies were taken you will be contacted by phone or by letter within the next 1-3 weeks.  Call your gastroenterologist if you have not heard about the biopsies in 3 weeks.  Our staff will call the home number listed on your records the next business day following your procedure to check on you and address any questions or concerns that you may have at that time regarding the information given to you following your procedure. This is a courtesy call and so if there is no answer at the home number and we have not heard from you through the emergency physician on call, we will assume that you have returned to your regular daily activities without incident.  SIGNATURES/CONFIDENTIALITY: You and/or your care partner have signed paperwork which will be entered into your electronic medical record.  These signatures attest to the fact that that the information above on your After Visit Summary has been reviewed and is understood.  Full responsibility of the confidentiality   of this discharge information lies with you and/or your care-partner.   Information on polyps ,diverticulosis ,& high fiber diet given to you today 

## 2012-08-26 NOTE — Progress Notes (Signed)
Called to room to assist during endoscopic procedure.  Patient ID and intended procedure confirmed with present staff. Received instructions for my participation in the procedure from the performing physician.  

## 2012-08-27 ENCOUNTER — Telehealth: Payer: Self-pay | Admitting: *Deleted

## 2012-08-27 NOTE — Telephone Encounter (Signed)
  Follow up Call-  Call back number 08/26/2012  Post procedure Call Back phone  # [phone]  Permission to leave phone message Yes     Patient questions:  Do you have a fever, pain , or abdominal swelling? no Pain Score  0 *  Have you tolerated food without any problems? yes  Have you been able to return to your normal activities? yes  Do you have any questions about your discharge instructions: Diet   no Medications  no Follow up visit  no  Do you have questions or concerns about your Care? no  Actions: * If pain score is 4 or above: No action needed, pain <4.

## 2012-08-29 ENCOUNTER — Encounter: Payer: Self-pay | Admitting: Internal Medicine

## 2012-11-28 ENCOUNTER — Ambulatory Visit: Payer: BC Managed Care – PPO | Admitting: Radiation Oncology

## 2012-12-25 ENCOUNTER — Telehealth: Payer: Self-pay | Admitting: *Deleted

## 2012-12-25 NOTE — Telephone Encounter (Signed)
Adrian [name] called stating that he needs a refill on Synthroid now because he dropped some of his tablets in the sink.  He received a prescription for 90 tabs with 2 refills on 19/24/14, but only has 4 tabs left until his 02/27/13 visit.  This 7/14 visit is the date in which he would normally receive his refill.  Dr. Roselind Messier notified.

## 2012-12-26 ENCOUNTER — Telehealth: Payer: Self-pay | Admitting: *Deleted

## 2012-12-26 NOTE — Telephone Encounter (Signed)
Per Dr Roselind Messier, okay to refill Levothyroxine thru [facility]. Pt states he wants script to be sent to current address [facility] has on file. Called [phone], refilled x1 through automated process. Confirmation # [id number], cost $2.00, should be received by 01/01/13. Prescription # [id number].

## 2013-02-27 ENCOUNTER — Ambulatory Visit
Admission: RE | Admit: 2013-02-27 | Discharge: 2013-02-27 | Disposition: A | Discharge status: Home / Self Care | Payer: BC Managed Care – PPO | Source: Ambulatory Visit | Attending: Radiation Oncology | Admitting: Radiation Oncology

## 2013-02-27 ENCOUNTER — Encounter: Payer: Self-pay | Admitting: Radiation Oncology

## 2013-02-27 VITALS — BP 135/76 | HR 77 | Temp 97.9°F | Resp 20 | Wt 150.0 lb

## 2013-02-27 DIAGNOSIS — C090 Malignant neoplasm of tonsillar fossa: Secondary | ICD-10-CM

## 2013-02-27 NOTE — Progress Notes (Signed)
  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  He is doing well and without complaints. He did established contact with primary care physician. The patient will followup with Dr. Caryl Never in the fall.  The patient would like to defer her blood work until that appointment.  He denies any ear pain,  swallowing difficulties or pain with swallowing. Patient denies any breathing problems or significant cough. He denies any hemoptysis. The patient continues to smoke approximately a half a pack of cigarettes per day and I cautioned him on this issue. Patient has recently obtained a nicotine patch to help with smoking cessation.                              ALLERGIES:  has No Known Allergies.  Meds: Current Outpatient Prescriptions  Medication Sig Dispense Refill  . levothyroxine (SYNTHROID, LEVOTHROID) 125 MCG tablet TAKE 1 TABLET DAILY  90 tablet  2   No current facility-administered medications for this encounter.    Physical Findings: The patient is in no acute distress. Patient is alert and oriented.  weight is 150 lb (68.04 kg). His oral temperature is 97.9 F (36.6 C). His blood pressure is 135/76 and his pulse is 77. His respiration is 20. Marland Kitchen  No palpable cervical supraclavicular or axillary adenopathy. The lungs are clear to auscultation. The heart has a regular rhythm and rate. Summation oral cavity reveals mucosa to be moist without secondary infection or mucosal lesion. The tonsillar areas are free of mucosal lesion. Patient proceeded to undergo an indirect your examination. There were no lesions noted along the base of tongue or laryngeal  area. Patient's vocal cords moved well on examination. Palpation along the base of tongue and tonsillar region reveals no suspicious induration.  Lab Findings: Lab Results  Component Value Date   WBC 6.0 06/20/2012   HGB 15.2 06/20/2012   HCT 45.1 06/20/2012   MCV 101.1* 06/20/2012   PLT 248.0 06/20/2012      Radiographic Findings: No results found.  Impression:  No evidence of recurrence on clinical exam today  Plan:  When necessary followup in radiation oncology. As above the patient will continue to followup  with his primary care physician at this point.  _____________________________________  -----------------------------------  Billie Lade, PhD, MD

## 2013-02-27 NOTE — Progress Notes (Addendum)
Pt denies pain, fatigue, loss of appetite, swallowing/eating difficulties, dry mouth. Pt's last TSH Oct 2013. Working part time for [employer]. Has not see ENT in last year. Established PCP w/Dr Caryl Never Nov 2013. Pt continues to smoke 1/2 PPD.

## 2013-03-28 ENCOUNTER — Telehealth: Payer: Self-pay | Admitting: Family Medicine

## 2013-03-28 MED ORDER — LEVOTHYROXINE SODIUM 125 MCG PO TABS: ORAL_TABLET | ORAL | Status: DC

## 2013-03-28 NOTE — Telephone Encounter (Signed)
Pt is calling to request a refill of his levothyroxine (SYNTHROID, LEVOTHROID) 125 MCG tablet. He states that the pharmacy could not fax [place] a request, as Dr. Caryl Never has not filled this since becoming this pt's PCP. He would like a 3 month supply faxed to [facility]. Please assist.

## 2013-03-28 NOTE — Telephone Encounter (Signed)
[facility]

## 2013-06-23 ENCOUNTER — Other Ambulatory Visit: Payer: Self-pay | Admitting: Family Medicine

## 2013-07-29 ENCOUNTER — Other Ambulatory Visit: Payer: Self-pay | Admitting: Family Medicine

## 2013-08-04 ENCOUNTER — Telehealth: Payer: Self-pay | Admitting: Family Medicine

## 2013-08-04 MED ORDER — LEVOTHYROXINE SODIUM 125 MCG PO TABS: ORAL_TABLET | ORAL | Status: DC

## 2013-08-04 NOTE — Telephone Encounter (Signed)
RX sent to pharmacy  

## 2013-08-04 NOTE — Telephone Encounter (Signed)
Pt has a med check scheduled for next tueday but will be out of his levothyroxine (SYNTHROID, LEVOTHROID) 125 MCG tablet prior to then. He would like this sent to the [facility] on [address]. Please assist.

## 2013-08-12 ENCOUNTER — Telehealth: Payer: Self-pay | Admitting: Family Medicine

## 2013-08-12 ENCOUNTER — Ambulatory Visit (INDEPENDENT_AMBULATORY_CARE_PROVIDER_SITE_OTHER): Payer: BC Managed Care – PPO | Admitting: Family Medicine

## 2013-08-12 ENCOUNTER — Encounter: Payer: Self-pay | Admitting: Family Medicine

## 2013-08-12 VITALS — BP 128/80 | HR 110 | Temp 98.4°F | Wt 153.0 lb

## 2013-08-12 DIAGNOSIS — F17200 Nicotine dependence, unspecified, uncomplicated: Secondary | ICD-10-CM

## 2013-08-12 DIAGNOSIS — I739 Peripheral vascular disease, unspecified: Secondary | ICD-10-CM

## 2013-08-12 DIAGNOSIS — E039 Hypothyroidism, unspecified: Secondary | ICD-10-CM

## 2013-08-12 LAB — TSH: TSH: 2.39 u[IU]/mL (ref 0.35–5.50)

## 2013-08-12 MED ORDER — LEVOTHYROXINE SODIUM 125 MCG PO TABS: ORAL_TABLET | ORAL | Status: DC

## 2013-08-12 NOTE — Progress Notes (Signed)
   Subjective:    Patient ID: Adrian [name], male    DOB: [birth year], 58 y.o.   MRN: [id number]  HPI Patient here for items as below  Hypothyroidism. Treated with levothyroxin. Needs followup levels. Compliant with therapy. Denies any fatigue issues. No constipation or cold intolerance.  Patient has noted asymptomatic roundish" spot" thoracic back region. No drainage. No pain. This was noted incidentally several months ago. No recent changes.  Patient describes 3 prior episodes of discoloration left second toe with associated pain. Most recent episode started couple weeks ago is gradually improving. His color is improving and his pain is resolving. He does not describe any claudication-type symptoms. He is a smoker or has history of mild hyperlipidemia. Does not take any aspirin. No history of any known peripheral vascular disease. No recent chest pains.  Past Medical History  Diagnosis Date  . Radiation NOv.3,2005-Dec. 15, 2005    6810 cGy in 30 fractions  . Tonsil cancer 2005  . History of chemotherapy 2005    Cisplatin  . Thyroid disease     Hypothyroid   Past Surgical History  Procedure Laterality Date  . Laminectomy      C5/placement of steel plate  . Neck surgery  2003    replaced disk    reports that he has been smoking Cigarettes.  He has been smoking about 0.00 packs per day for the past 20 years. He has never used smokeless tobacco. He reports that he drinks about 7.2 ounces of alcohol per week. He reports that he does not use illicit drugs. family history includes Cancer in his brother; Colon cancer in his father; Prostate cancer in his father; Skin cancer in his mother. No Known Allergies    Review of Systems  Constitutional: Negative for appetite change, fatigue and unexpected weight change.  Eyes: Negative for visual disturbance.  Respiratory: Negative for cough, chest tightness and shortness of breath.   Cardiovascular: Negative for chest pain, palpitations and  leg swelling.  Neurological: Negative for dizziness, syncope, weakness, light-headedness and headaches.       Objective:   Physical Exam  Constitutional: He appears well-developed and well-nourished.  Neck: Neck supple. No thyromegaly present.  Cardiovascular: Regular rhythm.   Slightly tachycardic with rate around 100 the patient states he had substantial caffeine  Pulmonary/Chest: Effort normal and breath sounds normal. No respiratory distress. He has no wheezes. He has no rales.  Musculoskeletal: He exhibits no edema.  Unable to palpate dorsalis pedis pulses bilaterally. 1+ posterior tibial pulses bilaterally. Slightly delayed capillary refill in both feet. Left second toe reveals some minimal purplish discoloration distally. Nontender. No necrosis.  Skin:  Patient has epidermal cyst with slightly brownish to dark in discoloration and dimple near the center. Nontender. No signs of inflammation such as erythema or tenderness          Assessment & Plan:  #1 hypothyroidism. Recheck TSH #2 benign sebaceous cyst mid thoracic back. Reassurance #3 smoking history. Counseling provided. Low motivation to quit. Handout given #4 left second toe discoloration. Suspect he has some component of peripheral vascular disease. He is describing recurrent episodes of discoloration left second toe. He does not describe any classic claudication symptoms. Set up arterial Dopplers to further assess

## 2013-08-12 NOTE — Patient Instructions (Signed)
Epidermal Cyst An epidermal cyst is sometimes called a sebaceous cyst, epidermal inclusion cyst, or infundibular cyst. These cysts usually contain a substance that looks "pasty" or "cheesy" and may have a bad smell. This substance is a protein called keratin. Epidermal cysts are usually found on the face, neck, or trunk. They may also occur in the vaginal area or other parts of the genitalia of both men and women. Epidermal cysts are usually small, painless, slow-growing bumps or lumps that move freely under the skin. It is important not to try to pop them. This may cause an infection and lead to tenderness and swelling. CAUSES  Epidermal cysts may be caused by a deep penetrating injury to the skin or a plugged hair follicle, often associated with acne. SYMPTOMS  Epidermal cysts can become inflamed and cause:  Redness.  Tenderness.  Increased temperature of the skin over the bumps or lumps.  Grayish-white, bad smelling material that drains from the bump or lump. DIAGNOSIS  Epidermal cysts are easily diagnosed by your caregiver during an exam. Rarely, a tissue sample (biopsy) may be taken to rule out other conditions that may resemble epidermal cysts. TREATMENT   Epidermal cysts often get better and disappear on their own. They are rarely ever cancerous.  If a cyst becomes infected, it may become inflamed and tender. This may require opening and draining the cyst. Treatment with antibiotics may be necessary. When the infection is gone, the cyst may be removed with minor surgery.  Small, inflamed cysts can often be treated with antibiotics or by injecting steroid medicines.  Sometimes, epidermal cysts become large and bothersome. If this happens, surgical removal in your caregiver's office may be necessary. HOME CARE INSTRUCTIONS  Only take over-the-counter or prescription medicines as directed by your caregiver.  Take your antibiotics as directed. Finish them even if you start to feel  better. SEEK MEDICAL CARE IF:   Your cyst becomes tender, red, or swollen.  Your condition is not improving or is getting worse.  You have any other questions or concerns. MAKE SURE YOU:  Understand these instructions.  Will watch your condition.  Will get help right away if you are not doing well or get worse. Document Released: 06/24/2004 Document Revised: 10/16/2011 Document Reviewed: 01/30/2011 [facility] Patient Information 2014 [place], [place]. Smoking Cessation Quitting smoking is important to your health and has many advantages. However, it is not always easy to quit since nicotine is a very addictive drug. Often times, people try 3 times or more before being able to quit. This document explains the best ways for you to prepare to quit smoking. Quitting takes hard work and a lot of effort, but you can do it. ADVANTAGES OF QUITTING SMOKING  You will live longer, feel better, and live better.  Your body will feel the impact of quitting smoking almost immediately.  Within 20 minutes, blood pressure decreases. Your pulse returns to its normal level.  After 8 hours, carbon monoxide levels in the blood return to normal. Your oxygen level increases.  After 24 hours, the chance of having a heart attack starts to decrease. Your breath, hair, and body stop smelling like smoke.  After 48 hours, damaged nerve endings begin to recover. Your sense of taste and smell improve.  After 72 hours, the body is virtually free of nicotine. Your bronchial tubes relax and breathing becomes easier.  After 2 to 12 weeks, lungs can hold more air. Exercise becomes easier and circulation improves.  The risk of having  a heart attack, stroke, cancer, or lung disease is greatly reduced.  After 1 year, the risk of coronary heart disease is cut in half.  After 5 years, the risk of stroke falls to the same as a nonsmoker.  After 10 years, the risk of lung cancer is cut in half and the risk of other  cancers decreases significantly.  After 15 years, the risk of coronary heart disease drops, usually to the level of a nonsmoker.  If you are pregnant, quitting smoking will improve your chances of having a healthy baby.  The people you live with, especially any children, will be healthier.  You will have extra money to spend on things other than cigarettes. QUESTIONS TO THINK ABOUT BEFORE ATTEMPTING TO QUIT You may want to talk about your answers with your caregiver.  Why do you want to quit?  If you tried to quit in the past, what helped and what did not?  What will be the most difficult situations for you after you quit? How will you plan to handle them?  Who can help you through the tough times? Your family? Friends? A caregiver?  What pleasures do you get from smoking? What ways can you still get pleasure if you quit? Here are some questions to ask your caregiver:  How can you help me to be successful at quitting?  What medicine do you think would be best for me and how should I take it?  What should I do if I need more help?  What is smoking withdrawal like? How can I get information on withdrawal? GET READY  Set a quit date.  Change your environment by getting rid of all cigarettes, ashtrays, matches, and lighters in your home, car, or work. Do not let people smoke in your home.  Review your past attempts to quit. Think about what worked and what did not. GET SUPPORT AND ENCOURAGEMENT You have a better chance of being successful if you have help. You can get support in many ways.  Tell your family, friends, and co-workers that you are going to quit and need their support. Ask them not to smoke around you.  Get individual, group, or telephone counseling and support. Programs are available at [employer] and health centers. Call your local health department for information about programs in your area.  Spiritual beliefs and practices may help some smokers  quit.  Download a "quit meter" on your computer to keep track of quit statistics, such as how long you have gone without smoking, cigarettes not smoked, and money saved.  Get a self-help book about quitting smoking and staying off of tobacco. [place] yourself from urges to smoke. Talk to someone, go for a walk, or occupy your time with a task.  Change your normal routine. Take a different route to work. Drink tea instead of coffee. Eat breakfast in a different place.  Reduce your stress. Take a hot bath, exercise, or read a book.  Plan something enjoyable to do every day. Reward yourself for not smoking.  Explore interactive web-based programs that specialize in helping you quit. GET MEDICINE AND USE IT CORRECTLY Medicines can help you stop smoking and decrease the urge to smoke. Combining medicine with the above behavioral methods and support can greatly increase your chances of successfully quitting smoking.  Nicotine replacement therapy helps deliver nicotine to your body without the negative effects and risks of smoking. Nicotine replacement therapy includes nicotine gum, lozenges, inhalers,  nasal sprays, and skin patches. Some may be available over-the-counter and others require a prescription.  Antidepressant medicine helps people abstain from smoking, but how this works is unknown. This medicine is available by prescription.  Nicotinic receptor partial agonist medicine simulates the effect of nicotine in your brain. This medicine is available by prescription. Ask your caregiver for advice about which medicines to use and how to use them based on your health history. Your caregiver will tell you what side effects to look out for if you choose to be on a medicine or therapy. Carefully read the information on the package. Do not use any other product containing nicotine while using a nicotine replacement product.  RELAPSE OR DIFFICULT SITUATIONS Most  relapses occur within the first 3 months after quitting. Do not be discouraged if you start smoking again. Remember, most people try several times before finally quitting. You may have symptoms of withdrawal because your body is used to nicotine. You may crave cigarettes, be irritable, feel very hungry, cough often, get headaches, or have difficulty concentrating. The withdrawal symptoms are only temporary. They are strongest when you first quit, but they will go away within 10 14 days. To reduce the chances of relapse, try to:  Avoid drinking alcohol. Drinking lowers your chances of successfully quitting.  Reduce the amount of caffeine you consume. Once you quit smoking, the amount of caffeine in your body increases and can give you symptoms, such as a rapid heartbeat, sweating, and anxiety.  Avoid smokers because they can make you want to smoke.  Do not let weight gain distract you. Many smokers will gain weight when they quit, usually less than 10 pounds. Eat a healthy diet and stay active. You can always lose the weight gained after you quit.  Find ways to improve your mood other than smoking. FOR MORE INFORMATION  www.smokefree.gov  Document Released: 07/18/2001 Document Revised: 01/23/2012 Document Reviewed: 11/02/2011 [facility] Patient Information 2014 [place], [place].  Consider baby aspirin one daily

## 2013-08-12 NOTE — Telephone Encounter (Signed)
Pt has been scheduled for [place] on 08/14/13-Thursday at 4:30 pm.  I called pt and left him a message with the appointment information.

## 2013-08-12 NOTE — Progress Notes (Signed)
Pre visit review using our clinic review tool, if applicable. No additional management support is needed unless otherwise documented below in the visit note. 

## 2013-08-13 ENCOUNTER — Telehealth: Payer: Self-pay

## 2013-08-13 NOTE — Telephone Encounter (Signed)
Pt stated that you set up appointment for him to see a provider on [address]. He would like for you to give him a call back he wants to reschedule the appt.

## 2013-08-14 ENCOUNTER — Encounter (HOSPITAL_COMMUNITY): Payer: BC Managed Care – PPO

## 2013-08-14 DIAGNOSIS — R0989 Other specified symptoms and signs involving the circulatory and respiratory systems: Secondary | ICD-10-CM

## 2013-09-05 ENCOUNTER — Other Ambulatory Visit: Payer: Self-pay | Admitting: Family Medicine

## 2013-11-14 ENCOUNTER — Ambulatory Visit (HOSPITAL_COMMUNITY): Payer: BC Managed Care – PPO | Attending: Cardiology | Admitting: Cardiology

## 2013-11-14 DIAGNOSIS — L819 Disorder of pigmentation, unspecified: Secondary | ICD-10-CM

## 2013-11-14 DIAGNOSIS — R0989 Other specified symptoms and signs involving the circulatory and respiratory systems: Secondary | ICD-10-CM | POA: Insufficient documentation

## 2013-11-14 DIAGNOSIS — I739 Peripheral vascular disease, unspecified: Secondary | ICD-10-CM | POA: Insufficient documentation

## 2013-11-14 NOTE — Progress Notes (Signed)
Bilateral lower extremity doppler completed 

## 2014-06-19 ENCOUNTER — Encounter: Payer: Self-pay | Admitting: Family Medicine

## 2014-06-19 ENCOUNTER — Ambulatory Visit (INDEPENDENT_AMBULATORY_CARE_PROVIDER_SITE_OTHER): Payer: BC Managed Care – PPO | Admitting: Family Medicine

## 2014-06-19 VITALS — BP 100/64 | HR 96 | Temp 97.7°F | Ht 68.0 in | Wt 150.0 lb

## 2014-06-19 DIAGNOSIS — R319 Hematuria, unspecified: Secondary | ICD-10-CM

## 2014-06-19 DIAGNOSIS — N410 Acute prostatitis: Secondary | ICD-10-CM

## 2014-06-19 LAB — POCT URINALYSIS DIPSTICK
Glucose, UA: NEGATIVE
LEUKOCYTES UA: NEGATIVE
Nitrite, UA: POSITIVE
PH UA: 6.5
Spec Grav, UA: 1.02
UROBILINOGEN UA: 4

## 2014-06-19 MED ORDER — CIPROFLOXACIN HCL 500 MG PO TABS: 500.0000 mg | ORAL_TABLET | Freq: Two times a day (BID) | ORAL | Status: DC

## 2014-06-19 MED ORDER — TAMSULOSIN HCL 0.4 MG PO CAPS: 0.4000 mg | ORAL_CAPSULE | Freq: Two times a day (BID) | ORAL | Status: DC

## 2014-06-19 NOTE — Progress Notes (Signed)
   Subjective:    Patient ID: Adrian [name], male    DOB: January 07, 1956, 58 y.o.   MRN: [id number]  HPI Here for the onset yesterday of urgency and frequency of urination, also a lot of lower abdominal pressure. No fever or nausea. He has never had a UTI before.    Review of Systems  Constitutional: Negative.   Gastrointestinal: Positive for abdominal pain and abdominal distention. Negative for nausea and vomiting.  Genitourinary: Positive for urgency, frequency and difficulty urinating. Negative for dysuria and flank pain.       Objective:   Physical Exam  Constitutional: He appears well-developed and well-nourished.  In pain   Abdominal: Bowel sounds are normal. He exhibits no mass. There is no rebound and no guarding.  Mildly distended and tender of the pubis   Genitourinary:  Prostate is mildly swollen and tender           Assessment & Plan:  Prostatitis with an element of bladder outlet obstruction. Treat with Cipro and Flomax to help open the urinary tract. Drink water. Culture the sample.

## 2014-06-19 NOTE — Progress Notes (Signed)
Pre visit review using our clinic review tool, if applicable. No additional management support is needed unless otherwise documented below in the visit note. 

## 2014-06-21 LAB — URINE CULTURE
COLONY COUNT: NO GROWTH
Organism ID, Bacteria: NO GROWTH

## 2014-08-31 ENCOUNTER — Other Ambulatory Visit: Payer: Self-pay | Admitting: Family Medicine

## 2014-09-01 ENCOUNTER — Other Ambulatory Visit: Payer: Self-pay | Admitting: Family Medicine

## 2014-09-15 ENCOUNTER — Ambulatory Visit (INDEPENDENT_AMBULATORY_CARE_PROVIDER_SITE_OTHER): Payer: BLUE CROSS/BLUE SHIELD

## 2014-09-15 ENCOUNTER — Encounter: Payer: Self-pay | Admitting: Podiatry

## 2014-09-15 ENCOUNTER — Ambulatory Visit (INDEPENDENT_AMBULATORY_CARE_PROVIDER_SITE_OTHER): Payer: BLUE CROSS/BLUE SHIELD | Admitting: Podiatry

## 2014-09-15 VITALS — BP 121/67 | HR 139 | Resp 16 | Ht 68.0 in | Wt 150.0 lb

## 2014-09-15 DIAGNOSIS — T691XXA Chilblains, initial encounter: Secondary | ICD-10-CM

## 2014-09-15 DIAGNOSIS — M6740 Ganglion, unspecified site: Secondary | ICD-10-CM

## 2014-09-15 DIAGNOSIS — M85671 Other cyst of bone, right ankle and foot: Secondary | ICD-10-CM

## 2014-09-15 NOTE — Progress Notes (Signed)
   Subjective:    Patient ID: Adrian [name], male    DOB: 1956-06-07, 59 y.o.   MRN: [id number]  HPI Comments: Right ball of foot has a knot , that is red, swollen , gets to be real tender and painful. It has been there for years.   Foot Pain      Review of Systems  All other systems reviewed and are negative.      Objective:   Physical Exam: I have reviewed his past medical history medications allergies surgeries social history and review of systems. Pulses are palpable bilateral. Neurologic sensorium is intact per Semmes-Weinstein monofilament. Deep tendon reflexes are intact muscle strength is 5 over 5 dorsiflexion plantar flexors and inverters everters on physical musculatures intact. Orthopedic evaluation demonstrates a large nonpulsatile fluctuant mass sub-first metatarsophalangeal joint of the right foot. Findings are consistent with a bursitis or a ganglion cyst that he has had a history of throat cancer we cannot rule out cancer at this point. He also demonstrates either chilblains or Buerger's disease to the distal aspects of the toes. Radiographic evaluation does not demonstrate any osseous abnormalities in the areas of question.        Assessment & Plan:  Assessment: Soft tissue mass sub-first metatarsophalangeal joint of the right foot most consistent with ganglion or very large bursa however secondary to history of cancer we would be wrong not to MRI this lesion.  Plan: MRI of the right forefoot with and without contrast.

## 2014-09-30 ENCOUNTER — Ambulatory Visit
Admission: RE | Admit: 2014-09-30 | Discharge: 2014-09-30 | Disposition: A | Discharge status: Home / Self Care | Payer: BLUE CROSS/BLUE SHIELD | Source: Ambulatory Visit | Attending: Podiatry | Admitting: Podiatry

## 2014-09-30 DIAGNOSIS — M6740 Ganglion, unspecified site: Secondary | ICD-10-CM

## 2014-09-30 MED ORDER — GADOBENATE DIMEGLUMINE 529 MG/ML IV SOLN
7.0000 mL | Freq: Once | INTRAVENOUS | Status: AC | PRN
  Administered 2014-09-30: 7 mL via INTRAVENOUS

## 2014-10-01 ENCOUNTER — Telehealth: Payer: Self-pay | Admitting: *Deleted

## 2014-10-01 NOTE — Telephone Encounter (Signed)
I called and requested MRI disk be sent to [place].  Need to send it to [facility] for a re-read per Dr. Milinda Pointer.  I left patient a message that Dr. Milinda Pointer received the MRI results.  He wants to have it re-read so we are sending it to [facility].  He wanted to make you aware of the delay.  Please call if you have any questions.

## 2014-10-06 NOTE — Telephone Encounter (Signed)
MRI disk was sent for a re-read by Dr. Judee Clara.

## 2014-10-07 ENCOUNTER — Telehealth: Payer: Self-pay

## 2014-10-07 NOTE — Telephone Encounter (Signed)
Left message informing pt that the MRI disk was sent out on 10/01/14 to [facility] for over-read and that this process could take 2 weeks, advised to call with questions or concerns

## 2014-10-20 ENCOUNTER — Encounter: Payer: Self-pay | Admitting: Podiatry

## 2014-10-20 ENCOUNTER — Ambulatory Visit (INDEPENDENT_AMBULATORY_CARE_PROVIDER_SITE_OTHER): Payer: BLUE CROSS/BLUE SHIELD | Admitting: Podiatry

## 2014-10-20 VITALS — BP 121/89 | HR 138 | Resp 16

## 2014-10-20 DIAGNOSIS — M6740 Ganglion, unspecified site: Secondary | ICD-10-CM

## 2014-10-20 NOTE — Patient Instructions (Signed)
Pre-Operative Instructions  Congratulations, you have decided to take an important step to improving your quality of life.  You can be assured that the doctors of [facility] will be with you every step of the way.  1. Plan to be at the surgery center/hospital at least 1 (one) hour prior to your scheduled time unless otherwise directed by the surgical center/hospital staff.  You must have a responsible adult accompany you, remain during the surgery and drive you home.  Make sure you have directions to the surgical center/hospital and know how to get there on time. 2. For hospital based surgery you will need to obtain a history and physical form from your family physician within 1 month prior to the date of surgery- we will give you a form for you primary physician.  3. We make every effort to accommodate the date you request for surgery.  There are however, times where surgery dates or times have to be moved.  We will contact you as soon as possible if a change in schedule is required.   4. No Aspirin/Ibuprofen for one week before surgery.  If you are on aspirin, any non-steroidal anti-inflammatory medications (Mobic, Aleve, Ibuprofen) you should stop taking it 7 days prior to your surgery.  You make take Tylenol  For pain prior to surgery.  5. Medications- If you are taking daily heart and blood pressure medications, seizure, reflux, allergy, asthma, anxiety, pain or diabetes medications, make sure the surgery center/hospital is aware before the day of surgery so they may notify you which medications to take or avoid the day of surgery. 6. No food or drink after midnight the night before surgery unless directed otherwise by surgical center/hospital staff. 7. No alcoholic beverages 24 hours prior to surgery.  No smoking 24 hours prior to or 24 hours after surgery. 8. Wear loose pants or shorts- loose enough to fit over bandages, boots, and casts. 9. No slip on shoes, sneakers are best. 10. Bring  your boot with you to the surgery center/hospital.  Also bring crutches or a walker if your physician has prescribed it for you.  If you do not have this equipment, it will be provided for you after surgery. 11. If you have not been contracted by the surgery center/hospital by the day before your surgery, call to confirm the date and time of your surgery. 12. Leave-time from work may vary depending on the type of surgery you have.  Appropriate arrangements should be made prior to surgery with your employer. 13. Prescriptions will be provided immediately following surgery by your doctor.  Have these filled as soon as possible after surgery and take the medication as directed. 14. Remove nail polish on the operative foot. 15. Wash the night before surgery.  The night before surgery wash the foot and leg well with the antibacterial soap provided and water paying special attention to beneath the toenails and in between the toes.  Rinse thoroughly with water and dry well with a towel.  Perform this wash unless told not to do so by your physician.  Enclosed: 1 Ice pack (please put in freezer the night before surgery)   1 Hibiclens skin cleaner   Pre-op Instructions  If you have any questions regarding the instructions, do not hesitate to call our office.  [place]: [address] [place], [place] [address] [phone]  [place]: [address] [place], [place] [address] [phone]  [place]: [address]  [place], [place] [address] [phone]  Dr. Richard   Tuchman DPM, Dr. Norman Regal DPM Dr. Richard Sikora DPM, Dr. M. Todd Hyatt DPM, Dr. Kathryn Egerton DPM 

## 2014-10-20 NOTE — Progress Notes (Signed)
He presents today for surgical consult and evaluation of his MRI regarding the plantar aspect of the first metatarsophalangeal joint of the right foot. He states that he seems to have reduced in size to some degree but is still painful.  Objective: Vital signs are stable alert and oriented 3. Pulses are strongly palpable. Still has pain on palpation of the soft tissue mass sub-first metatarsophalangeal joint right foot. MRI does suggest a granuloma associated as with a foreign body reaction. There is no erythema edema cellulitis drainage or odor and no open wounds at this point in time. There does appear to be some early skin breakdown which we discussed today.  Assessment: Soft tissue mass sub-first metatarsophalangeal joint right foot.  Plan: I encouraged him to go ahead and have surgical excision of this lesion performed area he would like to wait until late spring or early summer. We went over a consent form today line by line number by another giving him ample time to ask questions  he saw fit regarding excision soft tissue mass sub-first metatarsophalangeal joint of the right foot. I answered all questions to the best of my ability in layman's terms regarding this procedure. He understood it was amenable to it and sono 3 page of the consent form. I will follow-up with him in the near future for surgical intervention. He was dispensed a cam walker today for postop.

## 2014-10-22 ENCOUNTER — Encounter: Payer: Self-pay | Admitting: Podiatry

## 2014-11-11 ENCOUNTER — Encounter: Payer: Self-pay | Admitting: Podiatry

## 2014-11-23 ENCOUNTER — Ambulatory Visit (INDEPENDENT_AMBULATORY_CARE_PROVIDER_SITE_OTHER): Payer: BLUE CROSS/BLUE SHIELD

## 2014-11-23 ENCOUNTER — Ambulatory Visit (INDEPENDENT_AMBULATORY_CARE_PROVIDER_SITE_OTHER): Payer: BLUE CROSS/BLUE SHIELD | Admitting: Family Medicine

## 2014-11-23 VITALS — BP 120/80 | HR 79 | Temp 97.9°F | Resp 18 | Ht 68.0 in | Wt 150.0 lb

## 2014-11-23 DIAGNOSIS — S61412A Laceration without foreign body of left hand, initial encounter: Secondary | ICD-10-CM | POA: Diagnosis not present

## 2014-11-23 DIAGNOSIS — M79642 Pain in left hand: Secondary | ICD-10-CM

## 2014-11-23 NOTE — Progress Notes (Signed)
[facility] [address] [place] [address] [phone]  Date:  11/23/2014   Name:  Adrian [name]   DOB:  [birth year]   MRN:  [id number]  PCP:  [name], MD    Chief Complaint: Hand Injury   History of Present Illness:  Adrian [name] is a 59 y.o. very pleasant male patient who presents with the following:  This right handed male is here today as a new patient.  He was working with a nail gun earlier today and shot a nail into his left index finger. He is not quite sure what happened, but it seems that the nail entered the soft tissue of the distal finger and then pulled out from the side.  He thought the injury looked bad enough to come in to be seen.  He is otherwise in good health, and his tetanus is UTD  He is otherwise unhurt and has no other complaints today  Patient Active Problem List   Diagnosis Date Noted  . Unspecified hypothyroidism 08/12/2013  . History of radiation therapy 05/30/2012  . Smokes tobacco daily 05/30/2012  . Cancer of tonsillar fossa 11/27/2011    Past Medical History  Diagnosis Date  . Radiation NOv.3,2005-Dec. 15, 2005    6810 cGy in 30 fractions  . Tonsil cancer 2005  . History of chemotherapy 2005    Cisplatin  . Thyroid disease     Hypothyroid    Past Surgical History  Procedure Laterality Date  . Laminectomy      C5/placement of steel plate  . Neck surgery  2003    replaced disk    History  Substance Use Topics  . Smoking status: Current Every Day Smoker -- 20 years    Types: Cigarettes  . Smokeless tobacco: Never Used     Comment: 1/2 pack per day  . Alcohol Use: 7.2 oz/week    12 Cans of beer per week     Comment: couple of beers each day    Family History  Problem Relation Age of Onset  . Prostate cancer Father   . Colon cancer Father   . Skin cancer Mother   . Cancer Brother     No Known Allergies  Medication list has been reviewed and updated.  Current Outpatient Prescriptions on  File Prior to Visit  Medication Sig Dispense Refill  . levothyroxine (SYNTHROID, LEVOTHROID) 125 MCG tablet TAKE 1 TABLET DAILY 30 tablet 11  . tamsulosin (FLOMAX) 0.4 MG CAPS capsule Take 1 capsule (0.4 mg total) by mouth 2 (two) times daily. (Patient not taking: Reported on 09/15/2014) 20 capsule 0   No current facility-administered medications on file prior to visit.    Review of Systems:  As per HPI- otherwise negative.   Physical Examination: Filed Vitals:   11/23/14 1458  BP: 120/80  Pulse: 79  Temp: 97.9 F (36.6 C)  Resp: 18   Filed Vitals:   11/23/14 1458  Height: 5\' 8"  (1.727 m)  Weight: 150 lb (68.04 kg)   Body mass index is 22.81 kg/(m^2). Ideal Body Weight: Weight in (lb) to have BMI = 25: 164.1  GEN: WDWN, NAD, Non-toxic, A & O x 3, looks well HEENT: Atraumatic, Normocephalic. Neck supple. No masses, No LAD. Ears and Nose: No external deformity. CV: RRR, No M/G/R. No JVD. No thrill. No extra heart sounds. PULM: CTA B, no wheezes, crackles, rhonchi. No retractions. No resp. distress. No accessory muscle use. EXTR: No c/c/e NEURO  Normal gait.  PSYCH: Normally interactive. Conversant. Not depressed or anxious appearing.  Calm demeanor.  Left hand: the left index finger, radial aspect shows a J shaped laceration over the distal phalnx.  Suspect that the nail entered the soft tissue and then "ripped" out laterally leaving this laceration.  He has a nodule at the PIP joint which he states is baseline.  Normal strength to flexion and extension of the finger.  Normal sensation of the finger except he notes dulled sensation of the lateral finger distal to the wound. Sensation of the pad of the finger is normal  [facility] reading (PRIMARY) by  Dr. Lorelei Pont. Left hand: no fracture noted.  Bandage index finger.    LEFT HAND - COMPLETE 3+ VIEW  COMPARISON: None.  FINDINGS: There is no evidence of fracture or dislocation. There is no evidence of arthropathy or other focal  bone abnormality. No radiopaque foreign bodies are noted.  IMPRESSION: No fracture or dislocation is noted. No radiopaque foreign body is noted.  Assessment and Plan: Laceration of left hand, initial encounter - Plan: DG Hand Complete Left  Pain of left hand  Repaired as per note by Araceli Bouche, PA-C.  Wound care instructions discussed, he will RTC for SR and sooner if any concerns  Signed Lamar Blinks, MD

## 2014-11-23 NOTE — Patient Instructions (Signed)

## 2014-11-23 NOTE — Progress Notes (Signed)
Verbal consent obtained from patient.  Digital block anesthesia with 4cc 1% lido eithout epi  Wound scrubbed with soap and water and rinsed.  Wound closed with #5 5-0 ethilon sutures, 4 simple interrupted and one corner stitch.  Wound cleansed and dressed.

## 2014-12-03 ENCOUNTER — Ambulatory Visit (INDEPENDENT_AMBULATORY_CARE_PROVIDER_SITE_OTHER): Payer: BLUE CROSS/BLUE SHIELD | Admitting: Physician Assistant

## 2014-12-03 ENCOUNTER — Encounter: Payer: Self-pay | Admitting: Physician Assistant

## 2014-12-03 VITALS — BP 110/62 | HR 138 | Temp 97.6°F | Resp 17 | Ht 68.0 in | Wt 152.4 lb

## 2014-12-03 DIAGNOSIS — Z5189 Encounter for other specified aftercare: Secondary | ICD-10-CM

## 2014-12-03 NOTE — Progress Notes (Signed)
   Subjective:    Patient ID: Adrian [name], male    DOB: 11/21/1955, 59 y.o.   MRN: [id number]  Chief Complaint  Patient presents with  . Suture / Staple Removal   HPI  RTC 10 days post suture placement left index finger. Denies fevers, chills. Denies pus draining. Pain has significantly improved.   Review of Systems See HPI.     Objective:   Physical Exam  Constitutional: He appears well-developed and well-nourished.  Non-toxic appearance. He does not have a sickly appearance. He does not appear ill. No distress.  BP 110/62 mmHg  Pulse 138  Temp(Src) 97.6 F (36.4 C) (Oral)  Resp 17  Ht 5\' 8"  (1.727 m)  Wt 152 lb 6.4 oz (69.128 kg)  BMI 23.18 kg/m2  SpO2 98%   Skin:  Incision well healed. Five sutures removed. No surrounding erythema or induration. When most medial suture removed small amount drainage expressed. No pain over area.       Assessment & Plan:   RTC 10 days post suture placement left index finger  Encounter for wound care --sutures removed, small amount drainage from wound, likely inflammatory response vs possible purulence --one steri strip place --pt instructed to rtc with fevers, chills, expanding redness, induration, pus draining  Julieta Gutting, PA-C Physician Assistant-Certified [facility]  12/03/2014 12:31 PM

## 2015-08-31 ENCOUNTER — Other Ambulatory Visit: Payer: Self-pay | Admitting: Family Medicine

## 2015-09-05 ENCOUNTER — Other Ambulatory Visit: Payer: Self-pay | Admitting: Family Medicine

## 2015-10-12 ENCOUNTER — Encounter (HOSPITAL_COMMUNITY): Payer: Self-pay | Admitting: Emergency Medicine

## 2015-10-12 ENCOUNTER — Inpatient Hospital Stay (HOSPITAL_COMMUNITY)
Admission: EM | Admit: 2015-10-12 | Discharge: 2015-10-20 | DRG: 287 | Disposition: A | Discharge status: Home / Self Care | Payer: BLUE CROSS/BLUE SHIELD | Attending: Internal Medicine | Admitting: Internal Medicine

## 2015-10-12 ENCOUNTER — Ambulatory Visit (INDEPENDENT_AMBULATORY_CARE_PROVIDER_SITE_OTHER): Payer: BLUE CROSS/BLUE SHIELD | Admitting: Emergency Medicine

## 2015-10-12 ENCOUNTER — Emergency Department (HOSPITAL_COMMUNITY): Payer: BLUE CROSS/BLUE SHIELD

## 2015-10-12 ENCOUNTER — Other Ambulatory Visit: Payer: Self-pay | Admitting: Physician Assistant

## 2015-10-12 VITALS — BP 98/65 | HR 128 | Temp 98.5°F | Resp 16 | Ht 68.0 in | Wt 145.8 lb

## 2015-10-12 DIAGNOSIS — F17210 Nicotine dependence, cigarettes, uncomplicated: Secondary | ICD-10-CM | POA: Diagnosis present

## 2015-10-12 DIAGNOSIS — I2510 Atherosclerotic heart disease of native coronary artery without angina pectoris: Secondary | ICD-10-CM | POA: Diagnosis present

## 2015-10-12 DIAGNOSIS — Z9221 Personal history of antineoplastic chemotherapy: Secondary | ICD-10-CM | POA: Diagnosis not present

## 2015-10-12 DIAGNOSIS — Z85818 Personal history of malignant neoplasm of other sites of lip, oral cavity, and pharynx: Secondary | ICD-10-CM | POA: Diagnosis not present

## 2015-10-12 DIAGNOSIS — I420 Dilated cardiomyopathy: Secondary | ICD-10-CM | POA: Diagnosis present

## 2015-10-12 DIAGNOSIS — Z79899 Other long term (current) drug therapy: Secondary | ICD-10-CM

## 2015-10-12 DIAGNOSIS — I429 Cardiomyopathy, unspecified: Secondary | ICD-10-CM

## 2015-10-12 DIAGNOSIS — I5022 Chronic systolic (congestive) heart failure: Secondary | ICD-10-CM | POA: Diagnosis present

## 2015-10-12 DIAGNOSIS — Z8042 Family history of malignant neoplasm of prostate: Secondary | ICD-10-CM | POA: Diagnosis not present

## 2015-10-12 DIAGNOSIS — K5900 Constipation, unspecified: Secondary | ICD-10-CM | POA: Diagnosis present

## 2015-10-12 DIAGNOSIS — D72829 Elevated white blood cell count, unspecified: Secondary | ICD-10-CM | POA: Diagnosis present

## 2015-10-12 DIAGNOSIS — K5792 Diverticulitis of intestine, part unspecified, without perforation or abscess without bleeding: Secondary | ICD-10-CM | POA: Diagnosis not present

## 2015-10-12 DIAGNOSIS — I428 Other cardiomyopathies: Secondary | ICD-10-CM | POA: Insufficient documentation

## 2015-10-12 DIAGNOSIS — R1084 Generalized abdominal pain: Secondary | ICD-10-CM | POA: Diagnosis not present

## 2015-10-12 DIAGNOSIS — I483 Typical atrial flutter: Secondary | ICD-10-CM | POA: Diagnosis not present

## 2015-10-12 DIAGNOSIS — I5020 Unspecified systolic (congestive) heart failure: Secondary | ICD-10-CM | POA: Diagnosis not present

## 2015-10-12 DIAGNOSIS — R911 Solitary pulmonary nodule: Secondary | ICD-10-CM | POA: Diagnosis present

## 2015-10-12 DIAGNOSIS — F1010 Alcohol abuse, uncomplicated: Secondary | ICD-10-CM | POA: Diagnosis present

## 2015-10-12 DIAGNOSIS — I513 Intracardiac thrombosis, not elsewhere classified: Secondary | ICD-10-CM | POA: Diagnosis present

## 2015-10-12 DIAGNOSIS — I4892 Unspecified atrial flutter: Secondary | ICD-10-CM

## 2015-10-12 DIAGNOSIS — I4891 Unspecified atrial fibrillation: Secondary | ICD-10-CM | POA: Diagnosis not present

## 2015-10-12 DIAGNOSIS — Z808 Family history of malignant neoplasm of other organs or systems: Secondary | ICD-10-CM

## 2015-10-12 DIAGNOSIS — I442 Atrioventricular block, complete: Secondary | ICD-10-CM | POA: Diagnosis not present

## 2015-10-12 DIAGNOSIS — E039 Hypothyroidism, unspecified: Secondary | ICD-10-CM | POA: Diagnosis present

## 2015-10-12 DIAGNOSIS — K5732 Diverticulitis of large intestine without perforation or abscess without bleeding: Secondary | ICD-10-CM | POA: Diagnosis not present

## 2015-10-12 DIAGNOSIS — R000 Tachycardia, unspecified: Secondary | ICD-10-CM | POA: Diagnosis present

## 2015-10-12 DIAGNOSIS — Z800 Family history of malignant neoplasm of digestive organs: Secondary | ICD-10-CM | POA: Diagnosis not present

## 2015-10-12 DIAGNOSIS — R0602 Shortness of breath: Secondary | ICD-10-CM | POA: Insufficient documentation

## 2015-10-12 DIAGNOSIS — R931 Abnormal findings on diagnostic imaging of heart and coronary circulation: Secondary | ICD-10-CM | POA: Diagnosis not present

## 2015-10-12 HISTORY — DX: Unspecified atrial flutter: I48.92

## 2015-10-12 LAB — HEPATIC FUNCTION PANEL
ALK PHOS: 66 U/L (ref 38–126)
ALT: 21 U/L (ref 17–63)
AST: 20 U/L (ref 15–41)
Albumin: 3.4 g/dL — ABNORMAL LOW (ref 3.5–5.0)
BILIRUBIN INDIRECT: 0.7 mg/dL (ref 0.3–0.9)
BILIRUBIN TOTAL: 0.9 mg/dL (ref 0.3–1.2)
Bilirubin, Direct: 0.2 mg/dL (ref 0.1–0.5)
Total Protein: 6.9 g/dL (ref 6.5–8.1)

## 2015-10-12 LAB — POCT URINALYSIS DIP (MANUAL ENTRY)
BILIRUBIN UA: NEGATIVE
Glucose, UA: NEGATIVE
LEUKOCYTES UA: NEGATIVE
Nitrite, UA: NEGATIVE
PH UA: 6.5
SPEC GRAV UA: 1.015
UROBILINOGEN UA: 0.2

## 2015-10-12 LAB — POC MICROSCOPIC URINALYSIS (UMFC)

## 2015-10-12 LAB — BASIC METABOLIC PANEL
ANION GAP: 9 (ref 5–15)
BUN: 12 mg/dL (ref 6–20)
CHLORIDE: 106 mmol/L (ref 101–111)
CO2: 24 mmol/L (ref 22–32)
Calcium: 9 mg/dL (ref 8.9–10.3)
Creatinine, Ser: 0.99 mg/dL (ref 0.61–1.24)
GFR calc Af Amer: >60 mL/min (ref >60)
GFR calc non Af Amer: >60 mL/min (ref >60)
GLUCOSE: 102 mg/dL — AB (ref 65–99)
POTASSIUM: 4.1 mmol/L (ref 3.5–5.1)
Sodium: 139 mmol/L (ref 135–145)

## 2015-10-12 LAB — CBC
HEMATOCRIT: 46.6 % (ref 39.0–52.0)
HEMOGLOBIN: 15.9 g/dL (ref 13.0–17.0)
MCH: 34.2 pg — AB (ref 26.0–34.0)
MCHC: 34.1 g/dL (ref 30.0–36.0)
MCV: 100.2 fL — AB (ref 78.0–100.0)
Platelets: 181 10*3/uL (ref 150–400)
RBC: 4.65 MIL/uL (ref 4.22–5.81)
RDW: 13 % (ref 11.5–15.5)
WBC: 11 10*3/uL — ABNORMAL HIGH (ref 4.0–10.5)

## 2015-10-12 LAB — TSH: TSH: 2.18 u[IU]/mL (ref 0.350–4.500)

## 2015-10-12 LAB — LIPASE, BLOOD: LIPASE: 21 U/L (ref 11–51)

## 2015-10-12 LAB — HEPARIN LEVEL (UNFRACTIONATED): Heparin Unfractionated: 0.26 IU/mL — ABNORMAL LOW (ref 0.30–0.70)

## 2015-10-12 LAB — PHOSPHORUS: PHOSPHORUS: 3.1 mg/dL (ref 2.5–4.6)

## 2015-10-12 LAB — BRAIN NATRIURETIC PEPTIDE: B Natriuretic Peptide: 115.8 pg/mL — ABNORMAL HIGH (ref 0.0–100.0)

## 2015-10-12 LAB — TROPONIN I: Troponin I: 0.03 ng/mL (ref <0.031)

## 2015-10-12 MED ORDER — HEPARIN (PORCINE) IN NACL 100-0.45 UNIT/ML-% IJ SOLN
1350.0000 [IU]/h | INTRAMUSCULAR | Status: DC
  Administered 2015-10-12 (×2): 1000 [IU]/h via INTRAVENOUS
  Administered 2015-10-13: 1150 [IU]/h via INTRAVENOUS
  Administered 2015-10-14 – 2015-10-17 (×4): 1350 [IU]/h via INTRAVENOUS
  Filled 2015-10-12 (×8): qty 250

## 2015-10-12 MED ORDER — METRONIDAZOLE IN NACL 5-0.79 MG/ML-% IV SOLN: 500.0000 mg | Freq: Three times a day (TID) | INTRAVENOUS | Status: DC

## 2015-10-12 MED ORDER — AMIODARONE HCL IN DEXTROSE 360-4.14 MG/200ML-% IV SOLN
30.0000 mg/h | INTRAVENOUS | Status: DC
  Administered 2015-10-12 – 2015-10-16 (×9): 30 mg/h via INTRAVENOUS
  Filled 2015-10-12 (×9): qty 200

## 2015-10-12 MED ORDER — TAMSULOSIN HCL 0.4 MG PO CAPS
0.4000 mg | ORAL_CAPSULE | Freq: Two times a day (BID) | ORAL | Status: DC
  Administered 2015-10-12 – 2015-10-20 (×11): 0.4 mg via ORAL
  Filled 2015-10-12 (×13): qty 1

## 2015-10-12 MED ORDER — MORPHINE SULFATE (PF) 2 MG/ML IV SOLN: 1.0000 mg | INTRAVENOUS | Status: DC | PRN

## 2015-10-12 MED ORDER — AMIODARONE HCL IN DEXTROSE 360-4.14 MG/200ML-% IV SOLN
60.0000 mg/h | INTRAVENOUS | Status: DC
  Administered 2015-10-12: 150 mg/h via INTRAVENOUS
  Administered 2015-10-12: 60 mg/h via INTRAVENOUS
  Filled 2015-10-12 (×3): qty 200

## 2015-10-12 MED ORDER — DILTIAZEM LOAD VIA INFUSION
10.0000 mg | Freq: Once | INTRAVENOUS | Status: AC
  Administered 2015-10-12: 10 mg via INTRAVENOUS
  Filled 2015-10-12: qty 10

## 2015-10-12 MED ORDER — METRONIDAZOLE IN NACL 5-0.79 MG/ML-% IV SOLN
500.0000 mg | Freq: Once | INTRAVENOUS | Status: AC
  Administered 2015-10-12: 500 mg via INTRAVENOUS
  Filled 2015-10-12: qty 100

## 2015-10-12 MED ORDER — LEVOTHYROXINE SODIUM 125 MCG PO TABS
125.0000 ug | ORAL_TABLET | Freq: Every day | ORAL | Status: DC
  Administered 2015-10-12 – 2015-10-20 (×8): 125 ug via ORAL
  Filled 2015-10-12 (×9): qty 1

## 2015-10-12 MED ORDER — SODIUM CHLORIDE 0.9 % IV SOLN
3.0000 g | Freq: Four times a day (QID) | INTRAVENOUS | Status: AC
  Administered 2015-10-12 – 2015-10-15 (×12): 3 g via INTRAVENOUS
  Filled 2015-10-12 (×13): qty 3

## 2015-10-12 MED ORDER — SODIUM CHLORIDE 0.9 % IV BOLUS (SEPSIS)
1000.0000 mL | Freq: Once | INTRAVENOUS | Status: AC
Start: 2015-10-12 — End: 2015-10-12
  Administered 2015-10-12: 1000 mL via INTRAVENOUS

## 2015-10-12 MED ORDER — METOPROLOL TARTRATE 1 MG/ML IV SOLN
2.5000 mg | INTRAVENOUS | Status: DC | PRN
  Filled 2015-10-12: qty 5

## 2015-10-12 MED ORDER — AMIODARONE LOAD VIA INFUSION
150.0000 mg | INTRAVENOUS | Status: AC | PRN
  Administered 2015-10-12 – 2015-10-13 (×3): 150 mg via INTRAVENOUS
  Filled 2015-10-12: qty 83.34

## 2015-10-12 MED ORDER — HEPARIN BOLUS VIA INFUSION
4000.0000 [IU] | Freq: Once | INTRAVENOUS | Status: AC
  Administered 2015-10-12: 4000 [IU] via INTRAVENOUS
  Filled 2015-10-12: qty 4000

## 2015-10-12 MED ORDER — SODIUM CHLORIDE 0.9% FLUSH
3.0000 mL | Freq: Two times a day (BID) | INTRAVENOUS | Status: DC
  Administered 2015-10-12 – 2015-10-19 (×8): 3 mL via INTRAVENOUS

## 2015-10-12 MED ORDER — ADENOSINE 6 MG/2ML IV SOLN
6.0000 mg | Freq: Once | INTRAVENOUS | Status: AC
  Administered 2015-10-12: 6 mg via INTRAVENOUS
  Filled 2015-10-12: qty 2

## 2015-10-12 MED ORDER — ADENOSINE 6 MG/2ML IV SOLN: 12.0000 mg | Freq: Once | INTRAVENOUS | Status: DC

## 2015-10-12 MED ORDER — AMIODARONE LOAD VIA INFUSION
150.0000 mg | Freq: Once | INTRAVENOUS | Status: AC
  Administered 2015-10-12: 150 mg via INTRAVENOUS
  Filled 2015-10-12: qty 83.34

## 2015-10-12 MED ORDER — AMIODARONE HCL 150 MG/3ML IV SOLN
150.0000 mg | INTRAVENOUS | Status: DC | PRN
Start: 2015-10-12 — End: 2015-10-12

## 2015-10-12 MED ORDER — IOHEXOL 300 MG/ML  SOLN
80.0000 mL | Freq: Once | INTRAMUSCULAR | Status: AC | PRN
  Administered 2015-10-12: 80 mL via INTRAVENOUS

## 2015-10-12 MED ORDER — CIPROFLOXACIN IN D5W 400 MG/200ML IV SOLN
400.0000 mg | Freq: Once | INTRAVENOUS | Status: AC
  Administered 2015-10-12: 400 mg via INTRAVENOUS
  Filled 2015-10-12: qty 200

## 2015-10-12 MED ORDER — OXYCODONE HCL 5 MG PO TABS
5.0000 mg | ORAL_TABLET | Freq: Four times a day (QID) | ORAL | Status: DC | PRN
  Administered 2015-10-18: 5 mg via ORAL
  Filled 2015-10-12: qty 1

## 2015-10-12 MED ORDER — DEXTROSE 5 % IV SOLN
5.0000 mg/h | INTRAVENOUS | Status: DC
  Administered 2015-10-12: 5 mg/h via INTRAVENOUS
  Filled 2015-10-12: qty 100

## 2015-10-12 MED ORDER — SODIUM CHLORIDE 0.9 % IV SOLN
Freq: Once | INTRAVENOUS | Status: AC
  Administered 2015-10-12: 100 mL/h via INTRAVENOUS

## 2015-10-12 MED ORDER — SODIUM CHLORIDE 0.9 % IV BOLUS (SEPSIS)
1000.0000 mL | Freq: Once | INTRAVENOUS | Status: AC
  Administered 2015-10-12: 1000 mL via INTRAVENOUS

## 2015-10-12 NOTE — Progress Notes (Signed)
PharmD paged because Cipro is ordered for intra abd infection but pt's Qtc is over 500. Cipro d/c'd and Unasyn ordered. KJKG, NP [facility]

## 2015-10-12 NOTE — Progress Notes (Signed)
Patient ID: Adrian [name], male   DOB: 1956/05/13, 60 y.o.   MRN: [id number]     By signing my name below, I, Zola Button, attest that this documentation has been prepared under the direction and in the presence of Arlyss Queen, MD.  Electronically Signed: Zola Button, Medical Scribe. 10/12/2015. 9:34 AM.   Chief Complaint:  Chief Complaint  Patient presents with  . Abdominal Pain  . Constipation    HPI: Adrian [name] is a 60 y.o. male who reports to [facility] today complaining of constipation that started 4 days ago. He has only had small bowel movements of diarrhea described as "dirty water" since then. The following day, he woke up with abdominal distension and abdominal cramping. Patient reports having associated chills and generalized weakness. He tried Dulcolax but without relief. Patient denies urinary symptoms. He also denies recent travel and recent antibiotics. The only new food he ate was [place] eggs 4 nights ago. He does not feel his rectum is impacted. No prior abdominal surgeries, but he did have a feeding tube placed when he was going through tonsil cancer over 10 years ago. Patient notes he has had some issues with his feet; some of his toes are discolored and he has had a bump to his left second toe intermittently for the past 4 years.  Patient works at [employer].  Past Medical History  Diagnosis Date  . Radiation NOv.3,2005-Dec. 15, 2005    6810 cGy in 30 fractions  . Tonsil cancer ([place]) 2005  . History of chemotherapy 2005    Cisplatin  . Thyroid disease     Hypothyroid   Past Surgical History  Procedure Laterality Date  . Laminectomy      C5/placement of steel plate  . Neck surgery  2003    replaced disk   Social History   Social History  . Marital Status: Married    Spouse Name: Adrian [name]  . Number of Children: Adrian [name]  . Years of Education: Adrian [name]   Social History Main Topics  . Smoking status: Current Every Day Smoker -- 20 years    Types: Cigarettes  .  Smokeless tobacco: Never Used     Comment: 1/2 pack per day  . Alcohol Use: 7.2 oz/week    12 Cans of beer per week     Comment: couple of beers each day  . Drug Use: No  . Sexual Activity: Not Asked   Other Topics Concern  . None   Social History Narrative   Family History  Problem Relation Age of Onset  . Prostate cancer Father   . Colon cancer Father   . Skin cancer Mother   . Cancer Brother    No Known Allergies Prior to Admission medications   Medication Sig Start Date End Date Taking? Authorizing Provider  docusate sodium (COLACE) 100 MG capsule Take 100 mg by mouth 2 (two) times daily.   Yes Historical Provider, MD  levothyroxine (SYNTHROID, LEVOTHROID) 125 MCG tablet TAKE 1 TABLET DAILY 08/12/13  Yes [name], MD  tamsulosin (FLOMAX) 0.4 MG CAPS capsule Take 1 capsule (0.4 mg total) by mouth 2 (two) times daily. Patient not taking: Reported on 09/15/2014 06/19/14   Laurey Morale, MD     ROS: The patient denies night sweats, unintentional weight loss, chest pain, wheezing, dyspnea on exertion, nausea, vomiting, dysuria, hematuria, melena, numbness, or tingling.   All other systems have been reviewed and were otherwise negative with the exception of those  mentioned in the HPI and as above.    PHYSICAL EXAM: Filed Vitals:   10/12/15 0924  BP: 98/65  Pulse: 128  Temp: 98.5 F (36.9 C)  Resp: 16   Body mass index is 22.17 kg/(m^2).   General: Alert, no acute distress HEENT:  Normocephalic, atraumatic, oropharynx patent. Eye: Juliette Mangle [facility] Cardiovascular: Irregular rhythm. Tachycardia present. No murmur. Respiratory: Clear to auscultation bilaterally.  No wheezes, rales, or rhonchi.  No cyanosis, no use of accessory musculature Abdominal: There appears to be some abdominal distension. Some lower abdominal tenderness, right and left.  Musculoskeletal: Gait intact. No edema, tenderness Skin: Hands and feet are cold. Bluish discoloration to the tip of the  right great toe, left great toe, and left second toe. Neurologic: Facial musculature symmetric. Psychiatric: Patient acts appropriately throughout our interaction. Lymphatic: No cervical or submandibular lymphadenopathy Genitourinary: Rectal exam reveals no impaction.   LABS:    EKG/XRAY:   Primary read interpreted by Dr. Everlene Farrier at [facility].   ASSESSMENT/PLAN: Patient presents with severe low abdominal pain. He was found to be in atrial flutter. This raises the concern of emboli to the abdomen or lower extremities. Patient put on a monitor EMS called will transport to [facility] for further evaluation.I personally performed the services described in this documentation, which was scribed in my presence. The recorded information has been reviewed and is accurate.    Gross sideeffects, risk and benefits, and alternatives of medications d/w patient. Patient is aware that all medications have potential sideeffects and we are unable to predict every sideeffect or drug-drug interaction that may occur.  Arlyss Queen MD 10/12/2015 9:34 AM

## 2015-10-12 NOTE — ED Notes (Signed)
Spoke with Cardiology, told them the pt is still having persistent HR in 130's with Amio gtt. Cardiology will place new orders.

## 2015-10-12 NOTE — Progress Notes (Addendum)
Pt HR not controlled, on amio at 60 mg/hr. Will order boluses of amio prn x 3 as needed to control HR plus PRN IV metoprolol  Rosaria Ferries, PA-C 10/12/2015 5:34 PM Beeper [phone]

## 2015-10-12 NOTE — ED Notes (Signed)
Attempted to call report to 3S 

## 2015-10-12 NOTE — Consult Note (Signed)
[facility] CARDIOLOGY CONSULT NOTE  Referring Physician: Walker Primary Cardiologist: none Reason for Consultation: Tachycardia   HPI:  Adrian [name] is a 60 y/o male with h/o tonsilar cancer (s/pe chemo and resection 2005 with brief requirements of a feeding tube), tobacco and ETOH use who is being admitted through the ER for acute diverticulitis.   Denies any h/o known heart disease. Has never had any cardiac testing. Presented to Urgent Care this am complaining of fluctuating ab pain and constipation for several days that started 4 days ago. +chills and weakness but no frank fever. No blood in stool. Pain got worse this am so went to be evaluated. At [facility] found to be in AFL and transferred to [facility] ER over concern for possible embolic phenomenon to gut. In ER CT with diffuse diverticulitis involving the mid/distal aspect of the sigmoid colon. WBC 11.0.  HR on ECG was 140s. Given diltiazem without much effect. 6mg  of adenosine given with only transient improvement. I was walking by and asked by Dr. Wyvonnia Dusky to evaluate. I performed a carotid massage and he had about 3 seconds of complete AV block with clear flutter waves. Denies CP or SOB or palpitations. He has no idea how long his heart has been out of rhythm.  Patient works at [employer]. Drinks 5 beers every night after work and smoke 1/5 ppd of cigarettes.    Review of Systems:     Cardiac Review of Systems: {Y] = yes [ ]  = no  Chest Pain [    ]  Resting SOB [   ] Exertional SOB  [  ]  Orthopnea [  ]   Pedal Edema [   ]    Palpitations [  ] Syncope  [  ]   Presyncope [   ]  General Review of Systems: [Y] = yes [  ]=no Constitional: recent weight change [  ]; anorexia [  ]; fatigue Blue.Reese  ]; nausea [  ]; night sweats Blue.Reese  ]; fever [  ]; or chills Blue.Reese  ];                                                                     Eyes : blurred vision [  ]; diplopia [   ]; vision changes [  ];  Amaurosis fugax[  ]; Resp: cough [  ];  wheezing[  ];   hemoptysis[  ];  PND [  ];  GI:  Ab pain [y]gallstones[  ], vomiting[  ];  dysphagia[  ]; melena[  ];  hematochezia [  ]; heartburn[  ];  GU: kidney stones [  ]; hematuria[  ];   dysuria [  ];  nocturia[  ]; incontinence [  ];             Skin: rash, swelling[  ];, hair loss[  ];  peripheral edema[  ];  or itching[  ]; Musculosketetal: myalgias[  ];  joint swelling[  ];  joint erythema[  ];  joint pain[ y ];  back pain[  ];  Heme/Lymph: bruising[  ];  bleeding[  ];  anemia[  ];  Neuro: TIA[  ];  headaches[  ];  stroke[  ];  vertigo[  ];  seizures[  ];  paresthesias[  ];  difficulty walking[  ];  Psych:depression[  ]; anxiety[  ];  Endocrine: diabetes[  ];  thyroid dysfunction[  ];  Other:  Past Medical History  Diagnosis Date  . Radiation NOv.3,2005-Dec. 15, 2005    6810 cGy in 30 fractions  . Tonsil cancer ([place]) 2005  . History of chemotherapy 2005    Cisplatin  . Thyroid disease     Hypothyroid     (Not in a hospital admission)   . adenosine (ADENOCARD) IV  12 mg Intravenous Once    Infusions: . ciprofloxacin 400 mg (10/12/15 1326)  . diltiazem (CARDIZEM) infusion 10 mg/hr (10/12/15 1259)  . metronidazole 500 mg (10/12/15 1325)  . sodium chloride Stopped (10/12/15 1517)    No Known Allergies  Social History   Social History  . Marital Status: Married    Spouse Name: N/A  . Number of Children: N/A  . Years of Education: N/A   Occupational History  . Not on file.   Social History Main Topics  . Smoking status: Current Every Day Smoker -- 0.50 packs/day for 20 years    Types: Cigarettes  . Smokeless tobacco: Never Used     Comment: 1/2 pack per day  . Alcohol Use: 7.2 oz/week    12 Cans of beer per week     Comment: couple of beers each day  . Drug Use: No  . Sexual Activity: Not on file   Other Topics Concern  . Not on file   Social History Narrative    Family History  Problem Relation Age of Onset  . Prostate cancer Father   . Colon cancer  Father   . Skin cancer Mother   . Cancer Brother     PHYSICAL EXAM: Filed Vitals:   10/12/15 1315 10/12/15 1330  BP: 123/87 130/89  Pulse:    Temp:    Resp:  19     Intake/Output Summary (Last 24 hours) at 10/12/15 1402 Last data filed at 10/12/15 1152  Gross per 24 hour  Intake   1000 ml  Output      0 ml  Net   1000 ml    General:  Lying in bed No respiratory difficulty HEENT: normal Neck: supple. jvp 8 Carotids 2+ bilat; no bruits. No lymphadenopathy or thryomegaly appreciated. Cor: PMI nondisplaced. Tachy regular No rubs, gallops or murmurs. Lungs: clear Abdomen: soft, tender bilateral lower quadrants. No R/G. No hepatosplenomegaly. No bruits or masses. Good bowel sounds. Extremities: no cyanosis, clubbing, rash, edema Neuro: alert & oriented x 3, cranial nerves grossly intact. moves all 4 extremities w/o difficulty. Affect pleasant.  ECG: AFL 143 with mild ST depression   Results for orders placed or performed during the hospital encounter of 10/12/15 (from the past 24 hour(s))  Basic metabolic panel     Status: Abnormal   Collection Time: 10/12/15 10:41 AM  Result Value Ref Range   Sodium 139 135 - 145 mmol/L   Potassium 4.1 3.5 - 5.1 mmol/L   Chloride 106 101 - 111 mmol/L   CO2 24 22 - 32 mmol/L   Glucose, Bld 102 (H) 65 - 99 mg/dL   BUN 12 6 - 20 mg/dL   Creatinine, Ser 0.99 0.61 - 1.24 mg/dL   Calcium 9.0 8.9 - 10.3 mg/dL   GFR calc non Af Amer >60 >60 mL/min   GFR calc Af Amer >60 >60 mL/min   Anion gap 9 5 - 15  CBC  Status: Abnormal   Collection Time: 10/12/15 10:41 AM  Result Value Ref Range   WBC 11.0 (H) 4.0 - 10.5 K/uL   RBC 4.65 4.22 - 5.81 MIL/uL   Hemoglobin 15.9 13.0 - 17.0 g/dL   HCT 46.6 39.0 - 52.0 %   MCV 100.2 (H) 78.0 - 100.0 fL   MCH 34.2 (H) 26.0 - 34.0 pg   MCHC 34.1 30.0 - 36.0 g/dL   RDW 13.0 11.5 - 15.5 %   Platelets 181 150 - 400 K/uL  Hepatic function panel     Status: Abnormal   Collection Time: 10/12/15 10:41 AM    Result Value Ref Range   Total Protein 6.9 6.5 - 8.1 g/dL   Albumin 3.4 (L) 3.5 - 5.0 g/dL   AST 20 15 - 41 U/L   ALT 21 17 - 63 U/L   Alkaline Phosphatase 66 38 - 126 U/L   Total Bilirubin 0.9 0.3 - 1.2 mg/dL   Bilirubin, Direct 0.2 0.1 - 0.5 mg/dL   Indirect Bilirubin 0.7 0.3 - 0.9 mg/dL  Lipase, blood     Status: None   Collection Time: 10/12/15 10:41 AM  Result Value Ref Range   Lipase 21 11 - 51 U/L  Troponin I     Status: None   Collection Time: 10/12/15 10:41 AM  Result Value Ref Range   Troponin I 0.03 <0.031 ng/mL  Brain natriuretic peptide     Status: Abnormal   Collection Time: 10/12/15 11:51 AM  Result Value Ref Range   B Natriuretic Peptide 115.8 (H) 0.0 - 100.0 pg/mL   Ct Abdomen Pelvis W Contrast  10/12/2015  CLINICAL DATA:  Onset of lower abdominal / upper pelvic pain for the past 4 days. History of constipation and diarrhea. History of tonsillar cancer. EXAM: CT ABDOMEN AND PELVIS WITH CONTRAST TECHNIQUE: Multidetector CT imaging of the abdomen and pelvis was performed using the standard protocol following bolus administration of intravenous contrast. CONTRAST:  32mL OMNIPAQUE IOHEXOL 300 MG/ML  SOLN COMPARISON:  PET-CT - 12/28/2014 FINDINGS: Extensive diverticulosis involving the sigmoid colon with geographic area of wall thickening, adjacent mesenteric stranding and vascular congestion within the mid/distal aspect of the sigmoid colon (representative axial image 68, series 2), compatible with an area of acute uncomplicated diverticulitis. No evidence of perforation or definable/drainable fluid collection. No evidence of enteric obstruction. No pneumoperitoneum, pneumatosis or portal venous gas. The bowel is otherwise normal in course and caliber without discrete area of wall thickening. Normal appearance of the terminal ileum and appendix. Normal hepatic contour. Punctate (approximately 0.5 cm) hypoattenuating lesion within the right lobe of the liver (image 21, series 2)  is too small to accurately characterize though favored to represent a hepatic cyst. Normal appearance of the gallbladder given degree distention. No radiopaque gallstones. No intra extrahepatic biliary duct dilatation. No ascites. There is symmetric enhancement and excretion of the bilateral kidneys. No definite renal stones in this postcontrast examination. No discrete renal lesions. No urine obstruction a perinephric stranding. Normal appearance of the bilateral adrenal glands, pancreas and spleen. Scattered minimal amount of calcified atherosclerotic plaque within a normal caliber abdominal aorta. The major branch vessels of the abdominal aorta appear patent on this non CTA examination. Scattered retroperitoneal lymph nodes are individually not enlarged by size criteria. No retroperitoneal, mesenteric, pelvic or inguinal lymphadenopathy. Limited visualization of the lower thorax demonstrates a punctate (approximately 0.5 cm) nodule within the left lower lobe (image 7, series 3) grossly unchanged since PET-CT performed 12/2004 and  thus of benign etiology. Minimal dependent subpleural ground-glass atelectasis. No focal airspace opacities. No pleural effusion. Normal heart size. Trace amount of pericardial fluid, presumably physiologic. No acute or aggressive osseous abnormalities. No acute or aggressive osseous abnormalities. Note is made of partial lumbarization of the S1 vertebral body. There is moderate multilevel lumbar spine DDD, worse at L5-S1 with disc space height loss, endplate irregularity and sclerosis. Regional soft tissues are normal. IMPRESSION: 1. Acute uncomplicated diverticulitis involving the mid/distal aspect of the sigmoid colon. No evidence of perforation or definable/drainable fluid collection. No evidence of enteric obstruction. If not recently performed, further evaluation with colonoscopy after the resolution of acute symptoms is recommended to exclude the presence of an underlying colonic  malignancy. 2. Punctate (approximately 5 mm) left lower lobe pulmonary nodule is unchanged since the 12/2004 examination and thus of benign etiology. Electronically Signed   By: Sandi Mariscal M.D.   On: 10/12/2015 13:01   Dg Chest Portable 1 View  10/12/2015  CLINICAL DATA:  Abdominal pain, no bowel movement for 4 days, abnormal EKG, tachycardia, history tonsillar cancer post chemotherapy and radiation therapy, smoker EXAM: PORTABLE CHEST 1 VIEW COMPARISON:  Portable exam 1145 hours compared to 10/26/2010 FINDINGS: Lordotic positioning. Normal heart size, mediastinal contours, and pulmonary vascularity. Prominent first costochondral junctions bilaterally. Lungs clear. No pleural effusion or pneumothorax. Bones unremarkable. IMPRESSION: No acute abnormalities. Electronically Signed   By: Lavonia Dana M.D.   On: 10/12/2015 12:11     ASSESSMENT: 1. Atrial flutter with RVR    This patients CHA2DS2-VASc Score and unadjusted Ischemic Stroke Rate (% per year) is equal to 0.2 % stroke rate/year from a score of 0  Above score calculated as 1 point each if present [CHF, HTN, DM, Vascular=MI/PAD/Aortic Plaque, Age if 65-74, or Male] Above score calculated as 2 points each if present [Age > 75, or Stroke/TIA/TE] 2. Acute diverticulitis 3. ETOH abuse ~ 5 beers per day 4. Ongoing tobacco abuse 5. H/o tonsial CA  PLAN/DISCUSSION:  He has new onset AFL (of unclear duration) in setting of acute diverticulitis. Suspect AFL related to underlying illness but also at higher risk for atrial dysrhythmias due to heavy ETOH use. He has not responded to IV diltiazem so will start IV amiodarone for rate control. Will also heparinize for now. Will order echo and TSH. Hopefully he will convert with treatment of his underlying illness. CHADSVASC = 0 so not candidate for anticoagulation as outpatient. Counseled on need to limit ETOH watch closely for DTs. We will follow along.   Shari Natt,MD 2:06 PM

## 2015-10-12 NOTE — ED Notes (Signed)
Pt in via [facility], from Dr. Perfecto Kingdom office with c/o of abd pain. HR currently in AFlutter 170's. Denies CP or dizziness, no cardiac hx.

## 2015-10-12 NOTE — Progress Notes (Signed)
  ANTICOAGULATION CONSULT NOTE - Initial Consult  Pharmacy Consult for Heparin Indication: atrial fibrillation  No Known Allergies  Patient Measurements: Height: 5\' 8"  (172.7 cm) Weight: 145 lb (65.772 kg) IBW/kg (Calculated) : 68.4  Vital Signs: Temp: 98.7 F (37.1 C) (03/07 1029) Temp Source: Oral (03/07 0924) BP: 130/89 mmHg (03/07 1330) Pulse Rate: 139 (03/07 1200)  Labs:  Recent Labs  10/12/15 1041  HGB 15.9  HCT 46.6  PLT 181  CREATININE 0.99  TROPONINI 0.03    Estimated Creatinine Clearance: 74.8 mL/min (by C-G formula based on Cr of 0.99).   Medical History: Past Medical History  Diagnosis Date  . Radiation NOv.3,2005-Dec. 15, 2005    6810 cGy in 30 fractions  . Tonsil cancer ([place]) 2005  . History of chemotherapy 2005    Cisplatin  . Thyroid disease     Hypothyroid    Assessment: 33 YOM admitted 10/12/2015  with acute diverticulitis and found to be in AF.   PMH: tonsilar cancer (s/pe chemo and resection 2005 with brief requirements of a feeding tube), tobacco and ETOH use.  No AC PTA. Pharmacy consulted to dose heparin for new AF. H/H stable, PLT 181. No bleeding noted.   Plan:   Goal of Therapy:  Heparin level 0.3-0.7 units/ml Monitor platelets by anticoagulation protocol: Yes   Plan:  Heparin 4,000 unit bolus Heparin 1,000 units/hr 6hr HL  Monitor for s/s of bleeding   Tekla Malachowski C. Lennox Grumbles, PharmD Pharmacy Resident  Pager: [phone] 10/12/2015 2:42 PM

## 2015-10-12 NOTE — ED Provider Notes (Signed)
CSN: [id number]     Arrival date & time 10/12/15  1031 History   First MD Initiated Contact with Patient 10/12/15 1056     Chief Complaint  Patient presents with  . Tachycardia     (Consider location/radiation/quality/duration/timing/severity/associated sxs/prior Treatment) HPI Comments: Patient presents from urgent care with lower abdominal pain for the past 4 days. States he is constipated. He's had nausea but no vomiting. Normal by mouth intake and urine output. Sent from urgent care with tachycardic rhythm. He does not feel any palpitations. No chest pain or shortness of breath. No fevers. No history of heart or lung problems. States he took some laxatives since then he's only had a few watery bowel movements. Last normal bowel movement was 4 days ago. Nausea but no vomiting. No previous abdominal surgeries. No history of long term constipation. No heart or lung history.  The history is provided by the patient and a relative.    Past Medical History  Diagnosis Date  . Radiation NOv.3,2005-Dec. 15, 2005    6810 cGy in 30 fractions  . 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 History of chemotherapy 2005    Cisplatin  . Thyroid disease     Hypothyroid   Past Surgical History  Procedure Laterality Date  . Laminectomy      C5/placement of steel plate  . Neck surgery  2003    replaced disk   Family History  Problem Relation Age of Onset  . Prostate cancer Father   . Colon cancer Father   . Skin cancer Mother   . Cancer Brother    Social History  Substance Use Topics  . Smoking status: Current Every Day Smoker -- 0.50 packs/day for 20 years    Types: Cigarettes  . Smokeless tobacco: Never Used     Comment: 1/2 pack per day  . Alcohol Use: 7.2 oz/week    12 Cans of beer per week     Comment: couple of beers each day    Review of Systems  Constitutional: Negative for fever, activity change and appetite change.  Eyes: Negative for visual disturbance.  Respiratory: Negative for  cough, choking, chest tightness and shortness of breath.   Cardiovascular: Negative for chest pain.  Gastrointestinal: Positive for nausea, abdominal pain and constipation. Negative for vomiting.  Genitourinary: Negative for dysuria, hematuria and testicular pain.  Musculoskeletal: Negative for myalgias and arthralgias.  Skin: Negative for pallor and rash.  A complete 10 system review of systems was obtained and all systems are negative except as noted in the HPI and PMH.      Allergies  Review of patient's allergies indicates no known allergies.  Home Medications   Prior to Admission medications   Medication Sig Start Date End Date Taking? Authorizing Provider  docusate sodium (COLACE) 100 MG capsule Take 100 mg by mouth 2 (two) times daily.   Yes Historical Provider, MD  levothyroxine (SYNTHROID, LEVOTHROID) 125 MCG tablet TAKE 1 TABLET DAILY 08/12/13  Yes [name], MD  tamsulosin (FLOMAX) 0.4 MG CAPS capsule Take 1 capsule (0.4 mg total) by mouth 2 (two) times daily. Patient not taking: Reported on 09/15/2014 06/19/14   Laurey Morale, MD   BP 117/92 mmHg  Pulse 133  Temp(Src) 98.7 F (37.1 C)  Resp 25  Ht 5\' 8"  (1.727 m)  Wt 145 lb (65.772 kg)  BMI 22.05 kg/m2  SpO2 99% Physical Exam  Constitutional: He is oriented to person, place, and time. He appears well-developed and well-nourished.  No distress.  HENT:  Head: Normocephalic and atraumatic.  Mouth/Throat: Oropharynx is clear and moist. No oropharyngeal exudate.  Eyes: Conjunctivae and EOM are normal. Pupils are equal, round, and reactive to light.  Neck: Normal range of motion. Neck supple.  No meningismus.  Cardiovascular: Normal rate, normal heart sounds and intact distal pulses.   No murmur heard. Irregular rhythm, tachycardic  Pulmonary/Chest: Effort normal and breath sounds normal. No respiratory distress.  Abdominal: Soft. There is tenderness. There is no rebound and no guarding.  Diffuse lower abdominal  tenderness  Musculoskeletal: Normal range of motion. He exhibits no edema or tenderness.  Neurological: He is alert and oriented to person, place, and time. No cranial nerve deficit. He exhibits normal muscle tone. Coordination normal.  No ataxia on finger to nose bilaterally. No pronator drift. 5/5 strength throughout. CN 2-12 intact.Equal grip strength. Sensation intact.   Skin: Skin is warm.  Psychiatric: He has a normal mood and affect. His behavior is normal.  Nursing note and vitals reviewed.   ED Course  .Cardioversion Date/Time: 10/12/2015 1:43 PM Performed by: Ezequiel Essex Authorized by: Ezequiel Essex Consent: Verbal consent obtained. Risks and benefits: risks, benefits and alternatives were discussed Consent given by: patient Patient understanding: patient states understanding of the procedure being performed Patient consent: the patient's understanding of the procedure matches consent given Procedure consent: procedure consent matches procedure scheduled Patient identity confirmed: verbally with patient and provided demographic data Time out: Immediately prior to procedure a "time out" was called to verify the correct patient, procedure, equipment, support staff and site/side marked as required. Patient sedated: no Cardioversion basis: elective Indications: failure of anti-arrhythmic medications Pre-procedure rhythm: atrial flutter Patient position: patient was placed in a supine position Chest area: chest area exposed Electrodes placed: anterior-posterior Post-procedure rhythm: atrial flutter Complications: no complications Patient tolerance: Patient tolerated the procedure well with no immediate complications Comments: Chemical conversion with adenosine, back to Afib.   (including critical care time) Labs Review Labs Reviewed  BASIC METABOLIC PANEL - Abnormal; Notable for the following:    Glucose, Bld 102 (*)    All other components within normal limits  CBC  - Abnormal; Notable for the following:    WBC 11.0 (*)    MCV 100.2 (*)    MCH 34.2 (*)    All other components within normal limits  HEPATIC FUNCTION PANEL - Abnormal; Notable for the following:    Albumin 3.4 (*)    All other components within normal limits  BRAIN NATRIURETIC PEPTIDE - Abnormal; Notable for the following:    B Natriuretic Peptide 115.8 (*)    All other components within normal limits  LIPASE, BLOOD  TROPONIN I  HEPARIN LEVEL (UNFRACTIONATED)  TSH    Imaging Review Ct Abdomen Pelvis W Contrast  10/12/2015  CLINICAL DATA:  Onset of lower abdominal / upper pelvic pain for the past 4 days. History of constipation and diarrhea. History of tonsillar cancer. EXAM: CT ABDOMEN AND PELVIS WITH CONTRAST TECHNIQUE: Multidetector CT imaging of the abdomen and pelvis was performed using the standard protocol following bolus administration of intravenous contrast. CONTRAST:  21mL OMNIPAQUE IOHEXOL 300 MG/ML  SOLN COMPARISON:  PET-CT - 12/28/2014 FINDINGS: Extensive diverticulosis involving the sigmoid colon with geographic area of wall thickening, adjacent mesenteric stranding and vascular congestion within the mid/distal aspect of the sigmoid colon (representative axial image 68, series 2), compatible with an area of acute uncomplicated diverticulitis. No evidence of perforation or definable/drainable fluid collection. No evidence of enteric  obstruction. No pneumoperitoneum, pneumatosis or portal venous gas. The bowel is otherwise normal in course and caliber without discrete area of wall thickening. Normal appearance of the terminal ileum and appendix. Normal hepatic contour. Punctate (approximately 0.5 cm) hypoattenuating lesion within the right lobe of the liver (image 21, series 2) is too small to accurately characterize though favored to represent a hepatic cyst. Normal appearance of the gallbladder given degree distention. No radiopaque gallstones. No intra extrahepatic biliary duct  dilatation. No ascites. There is symmetric enhancement and excretion of the bilateral kidneys. No definite renal stones in this postcontrast examination. No discrete renal lesions. No urine obstruction a perinephric stranding. Normal appearance of the bilateral adrenal glands, pancreas and spleen. Scattered minimal amount of calcified atherosclerotic plaque within a normal caliber abdominal aorta. The major branch vessels of the abdominal aorta appear patent on this non CTA examination. Scattered retroperitoneal lymph nodes are individually not enlarged by size criteria. No retroperitoneal, mesenteric, pelvic or inguinal lymphadenopathy. Limited visualization of the lower thorax demonstrates a punctate (approximately 0.5 cm) nodule within the left lower lobe (image 7, series 3) grossly unchanged since PET-CT performed 12/2004 and thus of benign etiology. Minimal dependent subpleural ground-glass atelectasis. No focal airspace opacities. No pleural effusion. Normal heart size. Trace amount of pericardial fluid, presumably physiologic. No acute or aggressive osseous abnormalities. No acute or aggressive osseous abnormalities. Note is made of partial lumbarization of the S1 vertebral body. There is moderate multilevel lumbar spine DDD, worse at L5-S1 with disc space height loss, endplate irregularity and sclerosis. Regional soft tissues are normal. IMPRESSION: 1. Acute uncomplicated diverticulitis involving the mid/distal aspect of the sigmoid colon. No evidence of perforation or definable/drainable fluid collection. No evidence of enteric obstruction. If not recently performed, further evaluation with colonoscopy after the resolution of acute symptoms is recommended to exclude the presence of an underlying colonic malignancy. 2. Punctate (approximately 5 mm) left lower lobe pulmonary nodule is unchanged since the 12/2004 examination and thus of benign etiology. Electronically Signed   By: Sandi Mariscal M.D.   On:  10/12/2015 13:01   Dg Chest Portable 1 View  10/12/2015  CLINICAL DATA:  Abdominal pain, no bowel movement for 4 days, abnormal EKG, tachycardia, history tonsillar cancer post chemotherapy and radiation therapy, smoker EXAM: PORTABLE CHEST 1 VIEW COMPARISON:  Portable exam 1145 hours compared to 10/26/2010 FINDINGS: Lordotic positioning. Normal heart size, mediastinal contours, and pulmonary vascularity. Prominent first costochondral junctions bilaterally. Lungs clear. No pleural effusion or pneumothorax. Bones unremarkable. IMPRESSION: No acute abnormalities. Electronically Signed   By: Lavonia Dana M.D.   On: 10/12/2015 12:11   I have personally reviewed and evaluated these images and lab results as part of my medical decision-making.   EKG Interpretation   Date/Time:  Tuesday October 12 2015 11:06:09 EST Ventricular Rate:  143 PR Interval:  97 QRS Duration: 99 QT Interval:  378 QTC Calculation: 583 R Axis:   83 Text Interpretation:  Atrial flutter with predominant 2:1 AV block Paired  ventricular premature complexes Anteroseptal infarct, age indeterminate ST  elevation, consider inferior injury Prolonged QT interval No significant  change was found Confirmed by Wyvonnia Dusky  MD, Annslee Tercero [phone]) on 10/12/2015  11:44:49 AM      MDM   Final diagnoses:  Atrial flutter with rapid ventricular response (HCC)  Diverticulitis of intestine without perforation or abscess without bleeding   Patient from urgent care with four-day history of abdominal pain. Found to have new onset narrow complex tachycardia. No chest pain,  shortness of breath.  Suspect atrial flutter. Patient started on Cardizem without much effect. He had adenosine given which shows underlying rhythm is atrial flutter. ST elevation in AvR and V1 d/w Dr. Tamala Julian who agrees not STEMI and likely rate related. Rate quickly increased again to 130s.  D/w Dr. Haroldine Laws.  He has seen patient and recommends starting amiodarone and heparin gtt.  He agrees that rate is atrial flutter. However patient already received cipro for diverticulitis which has potential to prolong QT with amiodarone.  CT shows uncomplicated diverticulitis. Cipro and flagyl started.  Admission d/w Dr. Wendee Beavers. He is aware of potential of QT prolongation with amiodarone and cipro together.  HR remains in 130s.  Patient with no chest pain, SOB, palpitations.  Unable to cardiovert as unknown how long patient has been in this rhythm.   CRITICAL CARE Performed by: Ezequiel Essex Total critical care time: 60 minutes Critical care time was exclusive of separately billable procedures and treating other patients. Critical care was necessary to treat or prevent imminent or life-threatening deterioration. Critical care was time spent personally by me on the following activities: development of treatment plan with patient and/or surrogate as well as nursing, discussions with consultants, evaluation of patient's response to treatment, examination of patient, obtaining history from patient or surrogate, ordering and performing treatments and interventions, ordering and review of laboratory studies, ordering and review of radiographic studies, pulse oximetry and re-evaluation of patient's condition.   Ezequiel Essex, MD 10/12/15 [phone]

## 2015-10-12 NOTE — Progress Notes (Signed)
Pharmacy Antibiotic Note  Adrian [name] is a 60 y.o. male admitted on 10/12/2015 with acute uncomplicated diverticulitis. The patient is afebrile however WBC is slightly elevated at 11. Pharmacy was originally consulted to start Ciprofloxacin for intra-abdominal coverage in addition to Flagyl ordered by the MD however given the patient's prolonged QTc of 583 earlier today, it was discussed with Tylene Fantasia, NP and the patient will be started on Unasyn instead.   The patient received a dose of Cipro 400 mg IV x 1 at 1330 in the [facility].   Plan: 1. Start Unasyn 3g IV every 6 hours 2. Will continue to follow renal function, culture results, LOT, and antibiotic de-escalation plans   Height: 5\' 8"  (172.7 cm) Weight: 145 lb (65.772 kg) IBW/kg (Calculated) : 68.4  Temp (24hrs), Avg:98.5 F (36.9 C), Min:98.2 F (36.8 C), Max:98.7 F (37.1 C)   Recent Labs Lab 10/12/15 1041  WBC 11.0*  CREATININE 0.99    Estimated Creatinine Clearance: 74.8 mL/min (by C-G formula based on Cr of 0.99).    No Known Allergies  Alycia Rossetti, PharmD, BCPS Clinical Pharmacist Pager: [phone] 10/12/2015 8:08 PM

## 2015-10-12 NOTE — Progress Notes (Signed)
ANTICOAGULATION CONSULT NOTE - Follow Up Consult  Pharmacy Consult for heparin Indication: atrial fibrillation  No Known Allergies  Patient Measurements: Height: 5\' 8"  (172.7 cm) Weight: 145 lb (65.772 kg) IBW/kg (Calculated) : 68.4  Vital Signs: Temp: 98.2 F (36.8 C) (03/07 1930) Temp Source: Oral (03/07 1930) BP: 118/89 mmHg (03/07 2000) Pulse Rate: 129 (03/07 2000)  Labs:  Recent Labs  10/12/15 1041 10/12/15 2041  HGB 15.9  --   HCT 46.6  --   PLT 181  --   HEPARINUNFRC  --  0.26*  CREATININE 0.99  --   TROPONINI 0.03  --     Estimated Creatinine Clearance: 74.8 mL/min (by C-G formula based on Cr of 0.99).   Medications:  Infusions:  . amiodarone 30 mg/hr (10/12/15 2037)  . heparin 1,000 Units/hr (10/12/15 1505)    Assessment: 60 y/o male admitted with abdominal pain, now on IV heparin for new onset Afib. Heparin level is subtherapeutic at 0.26 on 1000 units/hr. No bleeding noted.  Goal of Therapy:  Heparin level 0.3-0.7 units/ml Monitor platelets by anticoagulation protocol: Yes   Plan:  - Increase heparin drip to 1150 units/hr - 6 hr HL - Daily HL and [place], Pharm.D., BCPS Clinical Pharmacist Pager: [phone] 10/12/2015 9:45 PM

## 2015-10-12 NOTE — ED Notes (Signed)
Repaged Stemi - Dr Tamala Julian to Dr. Wyvonnia Dusky.

## 2015-10-12 NOTE — ED Notes (Signed)
Dr. Tamala Julian - Stemi Dr, paged to Dr. Wyvonnia Dusky by Fortino Sic @ 11:10am.

## 2015-10-12 NOTE — ED Notes (Signed)
HR resumed to SR momentarily, but pt went back up to high 130's. 2nd dose of Adenosine discontinued by Cardiologist at bedside.

## 2015-10-12 NOTE — H&P (Signed)
[facility] History and Physical  CLARON CAPPELL [id number] DOB: [birth year] DOA: 10/12/2015  Referring physician: Dr. Wyvonnia Dusky PCP: [name], MD   Chief Complaint: Abdominal discomfort  HPI: Adrian [name] is a 60 y.o. male  Occasion male who presented to the ED complaining of abdominal discomfort. Upon further evaluation patient was found to have new onset atrial flutter of which cardiology was consulted. Patient's main complaint is abdominal discomfort which is worse at his lower quadrants. Nothing he is aware of makes it better. The problem has been persistent since onset. The problem has been present for the last several days.  Upon further evaluation in the ED patient found to have elevated white blood cell count and was found to have acute uncomplicated diverticulitis.   Review of Systems:  Constitutional:  No weight loss, night sweats, Fevers, chills, fatigue.  HEENT:  No headaches, Difficulty swallowing,Tooth/dental problems,Sore throat,  No sneezing, itching, ear ache, nasal congestion, post nasal drip,  Cardio-vascular:  No chest pain, Orthopnea, PND, swelling in lower extremities, anasarca, dizziness, + palpitations  GI:  No heartburn, indigestion, + abdominal pain, nausea, vomiting, diarrhea, change in bowel habits, + loss of appetite  Resp:  No shortness of breath with exertion or at rest. No excess mucus, no productive cough, No non-productive cough, No coughing up of blood.No change in color of mucus.No wheezing.No chest wall deformity  Skin:  no rash or lesions.  GU:  no dysuria, change in color of urine, no urgency or frequency. No flank pain.  Musculoskeletal:  No joint pain or swelling. No decreased range of motion. No back pain.  Psych:  No change in mood or affect. No depression or anxiety. No memory loss.   Past Medical History  Diagnosis Date  . Radiation NOv.3,2005-Dec. 15, 2005    6810 cGy in 30 fractions  . 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  History of chemotherapy 2005    Cisplatin  . Thyroid disease     Hypothyroid   Past Surgical History  Procedure Laterality Date  . Laminectomy      C5/placement of steel plate  . Neck surgery  2003    replaced disk   Social History:  reports that he has been smoking Cigarettes.  He has a 10 pack-year smoking history. He has never used smokeless tobacco. He reports that he drinks about 7.2 oz of alcohol per week. He reports that he does not use illicit drugs.  No Known Allergies  Family History  Problem Relation Age of Onset  . Prostate cancer Father   . Colon cancer Father   . Skin cancer Mother   . Cancer Brother     Prior to Admission medications   Medication Sig Start Date End Date Taking? Authorizing Provider  docusate sodium (COLACE) 100 MG capsule Take 100 mg by mouth 2 (two) times daily.   Yes Historical Provider, MD  levothyroxine (SYNTHROID, LEVOTHROID) 125 MCG tablet TAKE 1 TABLET DAILY 08/12/13  Yes [name], MD  tamsulosin (FLOMAX) 0.4 MG CAPS capsule Take 1 capsule (0.4 mg total) by mouth 2 (two) times daily. Patient not taking: Reported on 09/15/2014 06/19/14   Laurey Morale, MD   Physical Exam: Filed Vitals:   10/12/15 1430 10/12/15 1500 10/12/15 1530 10/12/15 1545  BP:      Pulse: 138 137 137 137  Temp:      Resp: 20 19 19 17   Height:      Weight:      SpO2:  96% 95% 96% 97%    Wt Readings from Last 3 Encounters:  10/12/15 65.772 kg (145 lb)  10/12/15 66.134 kg (145 lb 12.8 oz)  12/03/14 69.128 kg (152 lb 6.4 oz)    General:  Appears calm and comfortable Eyes: PERRL, normal lids, irises & conjunctiva ENT: grossly normal hearing, lips & tongue Neck: no LAD, masses or thyromegaly Cardiovascular: irregularly irregular, no rubs Telemetry: atrial flutter Respiratory: CTA bilaterally, no w/r/r. Normal respiratory effort. Abdomen: soft, tender at LLQ on deep palpation, nd Skin: no rash or induration seen on limited exam Musculoskeletal: grossly  normal tone BUE/BLE Psychiatric: grossly normal mood and affect, speech fluent and appropriate Neurologic: grossly non-focal.          Labs on Admission:  Basic Metabolic Panel:  Recent Labs Lab 10/12/15 1041  NA 139  K 4.1  CL 106  CO2 24  GLUCOSE 102*  BUN 12  CREATININE 0.99  CALCIUM 9.0   Liver Function Tests:  Recent Labs Lab 10/12/15 1041  AST 20  ALT 21  ALKPHOS 66  BILITOT 0.9  PROT 6.9  ALBUMIN 3.4*    Recent Labs Lab 10/12/15 1041  LIPASE 21   No results for input(s): AMMONIA in the last 168 hours. CBC:  Recent Labs Lab 10/12/15 1041  WBC 11.0*  HGB 15.9  HCT 46.6  MCV 100.2*  PLT 181   Cardiac Enzymes:  Recent Labs Lab 10/12/15 1041  TROPONINI 0.03    BNP (last 3 results)  Recent Labs  10/12/15 1151  BNP 115.8*    ProBNP (last 3 results) No results for input(s): PROBNP in the last 8760 hours.  CBG: No results for input(s): GLUCAP in the last 168 hours.  Radiological Exams on Admission: Ct Abdomen Pelvis W Contrast  10/12/2015  CLINICAL DATA:  Onset of lower abdominal / upper pelvic pain for the past 4 days. History of constipation and diarrhea. History of tonsillar cancer. EXAM: CT ABDOMEN AND PELVIS WITH CONTRAST TECHNIQUE: Multidetector CT imaging of the abdomen and pelvis was performed using the standard protocol following bolus administration of intravenous contrast. CONTRAST:  15mL OMNIPAQUE IOHEXOL 300 MG/ML  SOLN COMPARISON:  PET-CT - 12/28/2014 FINDINGS: Extensive diverticulosis involving the sigmoid colon with geographic area of wall thickening, adjacent mesenteric stranding and vascular congestion within the mid/distal aspect of the sigmoid colon (representative axial image 68, series 2), compatible with an area of acute uncomplicated diverticulitis. No evidence of perforation or definable/drainable fluid collection. No evidence of enteric obstruction. No pneumoperitoneum, pneumatosis or portal venous gas. The bowel is  otherwise normal in course and caliber without discrete area of wall thickening. Normal appearance of the terminal ileum and appendix. Normal hepatic contour. Punctate (approximately 0.5 cm) hypoattenuating lesion within the right lobe of the liver (image 21, series 2) is too small to accurately characterize though favored to represent a hepatic cyst. Normal appearance of the gallbladder given degree distention. No radiopaque gallstones. No intra extrahepatic biliary duct dilatation. No ascites. There is symmetric enhancement and excretion of the bilateral kidneys. No definite renal stones in this postcontrast examination. No discrete renal lesions. No urine obstruction a perinephric stranding. Normal appearance of the bilateral adrenal glands, pancreas and spleen. Scattered minimal amount of calcified atherosclerotic plaque within a normal caliber abdominal aorta. The major branch vessels of the abdominal aorta appear patent on this non CTA examination. Scattered retroperitoneal lymph nodes are individually not enlarged by size criteria. No retroperitoneal, mesenteric, pelvic or inguinal lymphadenopathy. Limited visualization of the  lower thorax demonstrates a punctate (approximately 0.5 cm) nodule within the left lower lobe (image 7, series 3) grossly unchanged since PET-CT performed 12/2004 and thus of benign etiology. Minimal dependent subpleural ground-glass atelectasis. No focal airspace opacities. No pleural effusion. Normal heart size. Trace amount of pericardial fluid, presumably physiologic. No acute or aggressive osseous abnormalities. No acute or aggressive osseous abnormalities. Note is made of partial lumbarization of the S1 vertebral body. There is moderate multilevel lumbar spine DDD, worse at L5-S1 with disc space height loss, endplate irregularity and sclerosis. Regional soft tissues are normal. IMPRESSION: 1. Acute uncomplicated diverticulitis involving the mid/distal aspect of the sigmoid colon.  No evidence of perforation or definable/drainable fluid collection. No evidence of enteric obstruction. If not recently performed, further evaluation with colonoscopy after the resolution of acute symptoms is recommended to exclude the presence of an underlying colonic malignancy. 2. Punctate (approximately 5 mm) left lower lobe pulmonary nodule is unchanged since the 12/2004 examination and thus of benign etiology. Electronically Signed   By: Sandi Mariscal M.D.   On: 10/12/2015 13:01   Dg Chest Portable 1 View  10/12/2015  CLINICAL DATA:  Abdominal pain, no bowel movement for 4 days, abnormal EKG, tachycardia, history tonsillar cancer post chemotherapy and radiation therapy, smoker EXAM: PORTABLE CHEST 1 VIEW COMPARISON:  Portable exam 1145 hours compared to 10/26/2010 FINDINGS: Lordotic positioning. Normal heart size, mediastinal contours, and pulmonary vascularity. Prominent first costochondral junctions bilaterally. Lungs clear. No pleural effusion or pneumothorax. Bones unremarkable. IMPRESSION: No acute abnormalities. Electronically Signed   By: Lavonia Dana M.D.   On: 10/12/2015 12:11    EKG: Independently reviewed. Atrial flutter  Assessment/Plan Active Problems:   Atrial flutter with rapid ventricular response ([place]) - Cardiology on board and managing - Agree with Heparin gtt - Agree with obtaining TSH levels    Diverticulitis - cipro and flagyl - supportive therapy with opiods   Code Status: full DVT Prophylaxis: On Heparin gtt Family Communication: d/c patient directly Disposition Plan: Pending improvement in condition  Time spent: > 45 minutes  Velvet Bathe [facility] Pager [phone]

## 2015-10-13 ENCOUNTER — Inpatient Hospital Stay (HOSPITAL_COMMUNITY): Payer: BLUE CROSS/BLUE SHIELD

## 2015-10-13 ENCOUNTER — Telehealth: Payer: Self-pay

## 2015-10-13 DIAGNOSIS — I4892 Unspecified atrial flutter: Principal | ICD-10-CM

## 2015-10-13 DIAGNOSIS — K5792 Diverticulitis of intestine, part unspecified, without perforation or abscess without bleeding: Secondary | ICD-10-CM | POA: Insufficient documentation

## 2015-10-13 DIAGNOSIS — E039 Hypothyroidism, unspecified: Secondary | ICD-10-CM | POA: Insufficient documentation

## 2015-10-13 LAB — CBC
HCT: 40.4 % (ref 39.0–52.0)
HEMOGLOBIN: 13.8 g/dL (ref 13.0–17.0)
MCH: 33.9 pg (ref 26.0–34.0)
MCHC: 34.2 g/dL (ref 30.0–36.0)
MCV: 99.3 fL (ref 78.0–100.0)
PLATELETS: 167 10*3/uL (ref 150–400)
RBC: 4.07 MIL/uL — AB (ref 4.22–5.81)
RDW: 13.1 % (ref 11.5–15.5)
WBC: 7.6 10*3/uL (ref 4.0–10.5)

## 2015-10-13 LAB — BASIC METABOLIC PANEL
ANION GAP: 8 (ref 5–15)
BUN: 8 mg/dL (ref 6–20)
CALCIUM: 8.5 mg/dL — AB (ref 8.9–10.3)
CO2: 22 mmol/L (ref 22–32)
CREATININE: 0.84 mg/dL (ref 0.61–1.24)
Chloride: 109 mmol/L (ref 101–111)
Glucose, Bld: 106 mg/dL — ABNORMAL HIGH (ref 65–99)
Potassium: 3.8 mmol/L (ref 3.5–5.1)
SODIUM: 139 mmol/L (ref 135–145)

## 2015-10-13 LAB — MRSA PCR SCREENING: MRSA BY PCR: NEGATIVE

## 2015-10-13 LAB — MAGNESIUM: MAGNESIUM: 1.9 mg/dL (ref 1.7–2.4)

## 2015-10-13 LAB — HEPARIN LEVEL (UNFRACTIONATED)
HEPARIN UNFRACTIONATED: 0.22 [IU]/mL — AB (ref 0.30–0.70)
HEPARIN UNFRACTIONATED: 0.35 [IU]/mL (ref 0.30–0.70)

## 2015-10-13 MED ORDER — SODIUM CHLORIDE 0.9 % IV BOLUS (SEPSIS)
1000.0000 mL | Freq: Once | INTRAVENOUS | Status: AC
  Administered 2015-10-13: 1000 mL via INTRAVENOUS

## 2015-10-13 MED ORDER — FOLIC ACID 1 MG PO TABS
1.0000 mg | ORAL_TABLET | Freq: Every day | ORAL | Status: DC
Start: 2015-10-13 — End: 2015-10-20
  Administered 2015-10-13 – 2015-10-20 (×8): 1 mg via ORAL
  Filled 2015-10-13 (×8): qty 1

## 2015-10-13 MED ORDER — THIAMINE HCL 100 MG/ML IJ SOLN
100.0000 mg | Freq: Every day | INTRAMUSCULAR | Status: DC
  Filled 2015-10-13 (×2): qty 1
  Filled 2015-10-13 (×2): qty 2

## 2015-10-13 MED ORDER — VITAMIN B-1 100 MG PO TABS
100.0000 mg | ORAL_TABLET | Freq: Every day | ORAL | Status: DC
  Administered 2015-10-13 – 2015-10-20 (×8): 100 mg via ORAL
  Filled 2015-10-13 (×8): qty 1

## 2015-10-13 MED ORDER — SODIUM CHLORIDE 0.9 % IV SOLN
INTRAVENOUS | Status: DC
  Administered 2015-10-13 – 2015-10-15 (×3): via INTRAVENOUS

## 2015-10-13 MED ORDER — LORAZEPAM 2 MG/ML IJ SOLN: 1.0000 mg | Freq: Four times a day (QID) | INTRAMUSCULAR | Status: AC | PRN

## 2015-10-13 MED ORDER — ADULT MULTIVITAMIN W/MINERALS CH
1.0000 | ORAL_TABLET | Freq: Every day | ORAL | Status: DC
  Administered 2015-10-13 – 2015-10-20 (×8): 1 via ORAL
  Filled 2015-10-13 (×8): qty 1

## 2015-10-13 MED ORDER — LORAZEPAM 1 MG PO TABS: 1.0000 mg | ORAL_TABLET | Freq: Four times a day (QID) | ORAL | Status: AC | PRN

## 2015-10-13 NOTE — Telephone Encounter (Signed)
Should the hospital be doing this?

## 2015-10-13 NOTE — Progress Notes (Signed)
SUBJECTIVE:  No complaints  OBJECTIVE:   Vitals:   Filed Vitals:   10/13/15 0615 10/13/15 0630 10/13/15 0750 10/13/15 0800  BP: 104/68 83/56  94/63  Pulse: 122 114  121  Temp:   98.2 F (36.8 C)   TempSrc:   Oral   Resp: 17 16  17   Height:      Weight:      SpO2: 94% 96%  93%   I&O's:   Intake/Output Summary (Last 24 hours) at 10/13/15 1120 Last data filed at 10/13/15 0000  Gross per 24 hour  Intake 1195.38 ml  Output      0 ml  Net 1195.38 ml   TELEMETRY: Reviewed telemetry pt in atrial flutter with RVR:     PHYSICAL EXAM General: Well developed, well nourished, in no acute distress Head: Eyes PERRLA, No xanthomas.   Normal cephalic and atramatic  Lungs:   Clear bilaterally to auscultation and percussion. Heart:   Regular and tachy S1 S2 Pulses are 2+ & equal. Abdomen: Bowel sounds are positive, abdomen soft and non-tender without masses  Extremities:   No clubbing, cyanosis or edema.  DP +1 Neuro: Alert and oriented X 3. Psych:  Good affect, responds appropriately   LABS: Basic Metabolic Panel:  Recent Labs  10/12/15 1041 10/12/15 2041 10/13/15 0350  NA 139  --  139  K 4.1  --  3.8  CL 106  --  109  CO2 24  --  22  GLUCOSE 102*  --  106*  BUN 12  --  8  CREATININE 0.99  --  0.84  CALCIUM 9.0  --  8.5*  MG  --   --  1.9  PHOS  --  3.1  --    Liver Function Tests:  Recent Labs  10/12/15 1041  AST 20  ALT 21  ALKPHOS 66  BILITOT 0.9  PROT 6.9  ALBUMIN 3.4*    Recent Labs  10/12/15 1041  LIPASE 21   CBC:  Recent Labs  10/12/15 1041 10/13/15 0350  WBC 11.0* 7.6  HGB 15.9 13.8  HCT 46.6 40.4  MCV 100.2* 99.3  PLT 181 167   Cardiac Enzymes:  Recent Labs  10/12/15 1041  TROPONINI 0.03   BNP: Invalid input(s): POCBNP D-Dimer: No results for input(s): DDIMER in the last 72 hours. Hemoglobin A1C: No results for input(s): HGBA1C in the last 72 hours. Fasting Lipid Panel: No results for input(s): CHOL, HDL, LDLCALC, TRIG,  CHOLHDL, LDLDIRECT in the last 72 hours. Thyroid Function Tests:  Recent Labs  10/12/15 1829  TSH 2.180   Anemia Panel: No results for input(s): VITAMINB12, FOLATE, FERRITIN, TIBC, IRON, RETICCTPCT in the last 72 hours. Coag Panel:   No results found for: INR, PROTIME  RADIOLOGY: Ct Abdomen Pelvis W Contrast  10/12/2015  CLINICAL DATA:  Onset of lower abdominal / upper pelvic pain for the past 4 days. History of constipation and diarrhea. History of tonsillar cancer. EXAM: CT ABDOMEN AND PELVIS WITH CONTRAST TECHNIQUE: Multidetector CT imaging of the abdomen and pelvis was performed using the standard protocol following bolus administration of intravenous contrast. CONTRAST:  82mL OMNIPAQUE IOHEXOL 300 MG/ML  SOLN COMPARISON:  PET-CT - 12/28/2014 FINDINGS: Extensive diverticulosis involving the sigmoid colon with geographic area of wall thickening, adjacent mesenteric stranding and vascular congestion within the mid/distal aspect of the sigmoid colon (representative axial image 68, series 2), compatible with an area of acute uncomplicated diverticulitis. No evidence of perforation or definable/drainable fluid collection.  No evidence of enteric obstruction. No pneumoperitoneum, pneumatosis or portal venous gas. The bowel is otherwise normal in course and caliber without discrete area of wall thickening. Normal appearance of the terminal ileum and appendix. Normal hepatic contour. Punctate (approximately 0.5 cm) hypoattenuating lesion within the right lobe of the liver (image 21, series 2) is too small to accurately characterize though favored to represent a hepatic cyst. Normal appearance of the gallbladder given degree distention. No radiopaque gallstones. No intra extrahepatic biliary duct dilatation. No ascites. There is symmetric enhancement and excretion of the bilateral kidneys. No definite renal stones in this postcontrast examination. No discrete renal lesions. No urine obstruction a perinephric  stranding. Normal appearance of the bilateral adrenal glands, pancreas and spleen. Scattered minimal amount of calcified atherosclerotic plaque within a normal caliber abdominal aorta. The major branch vessels of the abdominal aorta appear patent on this non CTA examination. Scattered retroperitoneal lymph nodes are individually not enlarged by size criteria. No retroperitoneal, mesenteric, pelvic or inguinal lymphadenopathy. Limited visualization of the lower thorax demonstrates a punctate (approximately 0.5 cm) nodule within the left lower lobe (image 7, series 3) grossly unchanged since PET-CT performed 12/2004 and thus of benign etiology. Minimal dependent subpleural ground-glass atelectasis. No focal airspace opacities. No pleural effusion. Normal heart size. Trace amount of pericardial fluid, presumably physiologic. No acute or aggressive osseous abnormalities. No acute or aggressive osseous abnormalities. Note is made of partial lumbarization of the S1 vertebral body. There is moderate multilevel lumbar spine DDD, worse at L5-S1 with disc space height loss, endplate irregularity and sclerosis. Regional soft tissues are normal. IMPRESSION: 1. Acute uncomplicated diverticulitis involving the mid/distal aspect of the sigmoid colon. No evidence of perforation or definable/drainable fluid collection. No evidence of enteric obstruction. If not recently performed, further evaluation with colonoscopy after the resolution of acute symptoms is recommended to exclude the presence of an underlying colonic malignancy. 2. Punctate (approximately 5 mm) left lower lobe pulmonary nodule is unchanged since the 12/2004 examination and thus of benign etiology. Electronically Signed   By: Sandi Mariscal M.D.   On: 10/12/2015 13:01   Dg Chest Portable 1 View  10/12/2015  CLINICAL DATA:  Abdominal pain, no bowel movement for 4 days, abnormal EKG, tachycardia, history tonsillar cancer post chemotherapy and radiation therapy, smoker  EXAM: PORTABLE CHEST 1 VIEW COMPARISON:  Portable exam 1145 hours compared to 10/26/2010 FINDINGS: Lordotic positioning. Normal heart size, mediastinal contours, and pulmonary vascularity. Prominent first costochondral junctions bilaterally. Lungs clear. No pleural effusion or pneumothorax. Bones unremarkable. IMPRESSION: No acute abnormalities. Electronically Signed   By: Lavonia Dana M.D.   On: 10/12/2015 12:11    ASSESSMENT/PLAN: 1. New onset Atrial flutter with RVR of unknown duration.  Suspect AFL related to underlying illness but also at higher risk for atrial dysrhythmias due to heavy ETOH use.  He did not respond to IV Cardizem so now on IV Amio gtt.   This patients CHA2DS2-VASc Score and unadjusted Ischemic Stroke Rate (% per year) is equal to 0.2 % stroke rate/year from a score of 0.  Above score calculated as 1 point each if present [CHF, HTN, DM, Vascular=MI/PAD/Aortic Plaque, Age if 65-74, or Male].  Above score calculated as 2 points each if present [Age > 75, or Stroke/TIA/TE].  Not candidate for longterm anticoagulation once back in NSR. Continue on IV Heparin gtt for now.  Check 2D echo to assess LA size, LVF and valvular heart disease.  If no valvluar heart disease then convert to Xarelto  20mg  daily.    2. Acute diverticulitis per IM 3. ETOH abuse ~ 5 beers per day - counseled on need to quit and cardiac effects of alcohol abuse 4. Ongoing tobacco abuse 5. H/o Curlene Labrum, MD  10/13/2015  11:20 AM

## 2015-10-13 NOTE — Progress Notes (Signed)
0600 Pt bp is still low 80's/50's. Np notified and gave orders. Will continue to monitor

## 2015-10-13 NOTE — Telephone Encounter (Signed)
Patient wife is calling because patient needs a note for his work stating he's in the hospital right now. Please fax! Fax: [phone] Attn: [employer]  Wife's Cell: [phone]

## 2015-10-13 NOTE — Telephone Encounter (Signed)
Yes, please provide with work note. Patient was initially seen by Dr. Everlene Farrier yesterday, 10/12/2015, and sent to the hospital via EMS.

## 2015-10-13 NOTE — Progress Notes (Signed)
ANTICOAGULATION CONSULT NOTE - Follow Up Consult  Pharmacy Consult for Heparin Indication: atrial fibrillation  No Known Allergies  Patient Measurements: Height: 5\' 8"  (172.7 cm) Weight: 145 lb (65.772 kg) IBW/kg (Calculated) : 68.4 Heparin Dosing Weight: 65.8 kg  Vital Signs: Temp: 97.5 F (36.4 C) (03/08 2045) Temp Source: Oral (03/08 2045) BP: 101/70 mmHg (03/08 2045) Pulse Rate: 107 (03/08 2045)  Labs:  Recent Labs  10/12/15 1041 10/12/15 2041 10/13/15 0350 10/13/15 1340 10/13/15 2150  HGB 15.9  --  13.8  --   --   HCT 46.6  --  40.4  --   --   PLT 181  --  167  --   --   HEPARINUNFRC  --  0.26*  --  0.22* 0.35  CREATININE 0.99  --  0.84  --   --   TROPONINI 0.03  --   --   --   --     Estimated Creatinine Clearance: 88.1 mL/min (by C-G formula based on Cr of 0.84).  Assessment: 1 YOM admitted 10/12/2015  with acute diverticulitis and found to be in AF.   Repeat HL is therapeutic at 0.35 on heparin 1350 units/hr. Nurse reported that infusion had been off for close to 45 minutes prior to level being drawn but has since been restarted. No issues with bleeding noted.  Goal of Therapy:  Heparin level 0.3-0.7 units/ml Monitor platelets by anticoagulation protocol: Yes   Plan:  Continue heparin 1350 units/hr Daily HL/CBC Monitor s/sx of bleeding  Andrey Cota. Diona Foley, PharmD, [place] Clinical Pharmacist Pager [phone]  10/13/2015,10:28 PM

## 2015-10-13 NOTE — Telephone Encounter (Signed)
Ok. Note written and faxed to employer. Wife notified.

## 2015-10-13 NOTE — Progress Notes (Signed)
[facility] PROGRESS NOTE  Adrian [name] [id number] DOB: 02/18/1956 DOA: 10/12/2015  PCP: [name], MD  Brief HPI: 60 year old Caucasian male with history of hypothyroidism who does drink significant amounts of alcohol in a daily basis, presented to the emergency department with complaints of abdominal pain and discomfort. He was seen previously at the urgent care center. Patient was found to be in atrial flutter with RVR. CT scan showed acute diverticulitis. He was hospitalized for further management.  Past medical history:  Past Medical History  Diagnosis Date  . Radiation NOv.3,2005-Dec. 15, 2005    6810 cGy in 30 fractions  . 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 History of chemotherapy 2005    Cisplatin  . Thyroid disease     Hypothyroid    Consultants: Cardiology  Procedures:  Echocardiogram is pending  Antibiotics: Unasyn  Subjective: Patient denies any dizziness. No chest pain. No shortness of breath, nausea, vomiting. Continues to have some lower abdominal discomfort at times.  Objective: Vital Signs  Filed Vitals:   10/13/15 0515 10/13/15 0600 10/13/15 0615 10/13/15 0630  BP: 90/61 90/61 104/68 83/56  Pulse: 121 121 122 114  Temp:      TempSrc:      Resp: 17 13 17 16   Height:      Weight:      SpO2: 94% 94% 94% 96%    Intake/Output Summary (Last 24 hours) at 10/13/15 0743 Last data filed at 10/13/15 0000  Gross per 24 hour  Intake 1195.38 ml  Output      0 ml  Net 1195.38 ml   Filed Weights   10/12/15 1029  Weight: 65.772 kg (145 lb)    General appearance: alert, cooperative, appears stated age and no distress Resp: clear to auscultation bilaterally Cardio: S1, S2 is tachycardic. Regular. No S3, S4. No rubs, murmurs, or bruit. No pedal edema. GI: Abdomen is soft. Tenderness in the lower quadrants without any rebound, rigidity or guarding. No masses or organomegaly. Bowel sounds are present. Extremities: extremities normal, atraumatic,  no cyanosis or edema Neurologic: Awake and alert. Oriented 3. No focal neurological deficits.  Lab Results:  Basic Metabolic Panel:  Recent Labs Lab 10/12/15 1041 10/12/15 2041 10/13/15 0350  NA 139  --  139  K 4.1  --  3.8  CL 106  --  109  CO2 24  --  22  GLUCOSE 102*  --  106*  BUN 12  --  8  CREATININE 0.99  --  0.84  CALCIUM 9.0  --  8.5*  MG  --   --  1.9  PHOS  --  3.1  --    Liver Function Tests:  Recent Labs Lab 10/12/15 1041  AST 20  ALT 21  ALKPHOS 66  BILITOT 0.9  PROT 6.9  ALBUMIN 3.4*    Recent Labs Lab 10/12/15 1041  LIPASE 21   CBC:  Recent Labs Lab 10/12/15 1041 10/13/15 0350  WBC 11.0* 7.6  HGB 15.9 13.8  HCT 46.6 40.4  MCV 100.2* 99.3  PLT 181 167   Cardiac Enzymes:  Recent Labs Lab 10/12/15 1041  TROPONINI 0.03   BNP (last 3 results)  Recent Labs  10/12/15 1151  BNP 115.8*     Recent Results (from the past 240 hour(s))  MRSA PCR Screening     Status: None   Collection Time: 10/13/15  2:37 AM  Result Value Ref Range Status   MRSA by PCR NEGATIVE NEGATIVE Final  Comment:        The GeneXpert MRSA Assay (FDA approved for NASAL specimens only), is one component of a comprehensive MRSA colonization surveillance program. It is not intended to diagnose MRSA infection nor to guide or monitor treatment for MRSA infections.       Studies/Results: Ct Abdomen Pelvis W Contrast  10/12/2015  CLINICAL DATA:  Onset of lower abdominal / upper pelvic pain for the past 4 days. History of constipation and diarrhea. History of tonsillar cancer. EXAM: CT ABDOMEN AND PELVIS WITH CONTRAST TECHNIQUE: Multidetector CT imaging of the abdomen and pelvis was performed using the standard protocol following bolus administration of intravenous contrast. CONTRAST:  64mL OMNIPAQUE IOHEXOL 300 MG/ML  SOLN COMPARISON:  PET-CT - 12/28/2014 FINDINGS: Extensive diverticulosis involving the sigmoid colon with geographic area of wall thickening,  adjacent mesenteric stranding and vascular congestion within the mid/distal aspect of the sigmoid colon (representative axial image 68, series 2), compatible with an area of acute uncomplicated diverticulitis. No evidence of perforation or definable/drainable fluid collection. No evidence of enteric obstruction. No pneumoperitoneum, pneumatosis or portal venous gas. The bowel is otherwise normal in course and caliber without discrete area of wall thickening. Normal appearance of the terminal ileum and appendix. Normal hepatic contour. Punctate (approximately 0.5 cm) hypoattenuating lesion within the right lobe of the liver (image 21, series 2) is too small to accurately characterize though favored to represent a hepatic cyst. Normal appearance of the gallbladder given degree distention. No radiopaque gallstones. No intra extrahepatic biliary duct dilatation. No ascites. There is symmetric enhancement and excretion of the bilateral kidneys. No definite renal stones in this postcontrast examination. No discrete renal lesions. No urine obstruction a perinephric stranding. Normal appearance of the bilateral adrenal glands, pancreas and spleen. Scattered minimal amount of calcified atherosclerotic plaque within a normal caliber abdominal aorta. The major branch vessels of the abdominal aorta appear patent on this non CTA examination. Scattered retroperitoneal lymph nodes are individually not enlarged by size criteria. No retroperitoneal, mesenteric, pelvic or inguinal lymphadenopathy. Limited visualization of the lower thorax demonstrates a punctate (approximately 0.5 cm) nodule within the left lower lobe (image 7, series 3) grossly unchanged since PET-CT performed 12/2004 and thus of benign etiology. Minimal dependent subpleural ground-glass atelectasis. No focal airspace opacities. No pleural effusion. Normal heart size. Trace amount of pericardial fluid, presumably physiologic. No acute or aggressive osseous  abnormalities. No acute or aggressive osseous abnormalities. Note is made of partial lumbarization of the S1 vertebral body. There is moderate multilevel lumbar spine DDD, worse at L5-S1 with disc space height loss, endplate irregularity and sclerosis. Regional soft tissues are normal. IMPRESSION: 1. Acute uncomplicated diverticulitis involving the mid/distal aspect of the sigmoid colon. No evidence of perforation or definable/drainable fluid collection. No evidence of enteric obstruction. If not recently performed, further evaluation with colonoscopy after the resolution of acute symptoms is recommended to exclude the presence of an underlying colonic malignancy. 2. Punctate (approximately 5 mm) left lower lobe pulmonary nodule is unchanged since the 12/2004 examination and thus of benign etiology. Electronically Signed   By: Sandi Mariscal M.D.   On: 10/12/2015 13:01   Dg Chest Portable 1 View  10/12/2015  CLINICAL DATA:  Abdominal pain, no bowel movement for 4 days, abnormal EKG, tachycardia, history tonsillar cancer post chemotherapy and radiation therapy, smoker EXAM: PORTABLE CHEST 1 VIEW COMPARISON:  Portable exam 1145 hours compared to 10/26/2010 FINDINGS: Lordotic positioning. Normal heart size, mediastinal contours, and pulmonary vascularity. Prominent first costochondral junctions bilaterally.  Lungs clear. No pleural effusion or pneumothorax. Bones unremarkable. IMPRESSION: No acute abnormalities. Electronically Signed   By: Lavonia Dana M.D.   On: 10/12/2015 12:11    Medications:  Scheduled: . ampicillin-sulbactam (UNASYN) IV  3 g Intravenous [id number]  . folic acid  1 mg Oral Daily  . levothyroxine  125 mcg Oral QAC breakfast  . multivitamin with minerals  1 tablet Oral Daily  . sodium chloride flush  3 mL Intravenous Q12H  . tamsulosin  0.4 mg Oral BID  . thiamine  100 mg Oral Daily   Or  . thiamine  100 mg Intravenous Daily   Continuous: . sodium chloride 100 mL/hr at 10/13/15 0609  .  amiodarone 30 mg/hr (10/13/15 1019)  . heparin 1,150 Units/hr (10/13/15 0815)   [id number] **OR** LORazepam, metoprolol, morphine injection, oxyCODONE  Assessment/Plan:  Active Problems:   Atrial flutter with rapid ventricular response ([place])   Diverticulitis    New onset Atrial flutter with RVR Cardiology has been consulted. Patient is on amiodarone infusion. He was also started on IV heparin. Blood pressure noted to be low. Patient is asymptomatic. Echocardiogram is pending. TSH is normal. Long-term anticoagulation per cardiology.  Acute diverticulitis Continue Unasyn. Downgrade diet to liquids. Symptomatic treatment. Patient reports having had a colonoscopy 2 years ago which apparently was unremarkable. He can follow-up with his primary care provider to discuss this further.  Left lower lobe pulmonary nodule. Appears to be stable compared to imaging studies from more than 10 years ago and hence thought to be benign. Outpatient follow-up.  Hypothyroidism. Continue with Synthroid. He will need close monitoring of his thyroid function tests if amiodarone is used long-term.   DVT Prophylaxis: Currently on IV heparin    Code Status: Full code  Family Communication: Discussed with the patient  Disposition Plan: Await improvement in heart rate, and abdominal symptoms    LOS: 1 day   [facility] Pager [phone] 10/13/2015, 7:43 AM  If 7PM-7AM, please contact night-coverage at [email or web address], password [facility]

## 2015-10-13 NOTE — Progress Notes (Signed)
ANTICOAGULATION CONSULT NOTE - Follow Up Consult  Pharmacy Consult for Heparin Indication: atrial fibrillation  No Known Allergies  Patient Measurements: Height: 5\' 8"  (172.7 cm) Weight: 145 lb (65.772 kg) IBW/kg (Calculated) : 68.4 Heparin Dosing Weight: 65.8 kg  Vital Signs: Temp: 98.2 F (36.8 C) (03/08 0750) Temp Source: Oral (03/08 0750) BP: 94/63 mmHg (03/08 0800) Pulse Rate: 121 (03/08 0800)  Labs:  Recent Labs  10/12/15 1041 10/12/15 2041 10/13/15 0350 10/13/15 1340  HGB 15.9  --  13.8  --   HCT 46.6  --  40.4  --   PLT 181  --  167  --   HEPARINUNFRC  --  0.26*  --  0.22*  CREATININE 0.99  --  0.84  --   TROPONINI 0.03  --   --   --     Estimated Creatinine Clearance: 88.1 mL/min (by C-G formula based on Cr of 0.84).  Assessment: 35 YOM admitted 10/12/2015  with acute diverticulitis and found to be in AF.   PMH: tonsilar cancer (s/pe chemo and resection 2005 with brief requirements of a feeding tube), tobacco and ETOH use.  Anticoag: Aflutter. Heparin level  0.22 down. Hgb 15.9>13.8. - Not candidate for longterm anticoagulation once back in NSR per cards note.   Goal of Therapy:  Heparin level 0.3-0.7 units/ml Monitor platelets by anticoagulation protocol: Yes   Plan:  Increase IV heparin to 1350 units/hr Recheck HL in 6-8 hrs.   Kahlen Morais S. Alford Highland, PharmD, BCPS Clinical Staff Pharmacist Pager [phone]  Eilene Ghazi Stillinger 10/13/2015,2:24 PM

## 2015-10-13 NOTE — Progress Notes (Signed)
*  PRELIMINARY RESULTS* Echocardiogram 2D Echocardiogram has been performed.  Adrian [name] 10/13/2015, 3:53 PM

## 2015-10-13 NOTE — Progress Notes (Signed)
Pt Bp is trending low. BP is 90's/60's. Pt is also having dark concentrated urine. NP notified and gave orders. Will continue to monitor pt.

## 2015-10-14 ENCOUNTER — Encounter (HOSPITAL_COMMUNITY): Payer: Self-pay | Admitting: Cardiology

## 2015-10-14 ENCOUNTER — Other Ambulatory Visit (HOSPITAL_COMMUNITY): Payer: BLUE CROSS/BLUE SHIELD

## 2015-10-14 ENCOUNTER — Telehealth: Payer: Self-pay

## 2015-10-14 DIAGNOSIS — I420 Dilated cardiomyopathy: Secondary | ICD-10-CM

## 2015-10-14 DIAGNOSIS — I428 Other cardiomyopathies: Secondary | ICD-10-CM

## 2015-10-14 DIAGNOSIS — I5020 Unspecified systolic (congestive) heart failure: Secondary | ICD-10-CM

## 2015-10-14 HISTORY — DX: Other cardiomyopathies: I42.8

## 2015-10-14 LAB — BASIC METABOLIC PANEL
ANION GAP: 8 (ref 5–15)
BUN: 7 mg/dL (ref 6–20)
CO2: 22 mmol/L (ref 22–32)
Calcium: 8.3 mg/dL — ABNORMAL LOW (ref 8.9–10.3)
Chloride: 110 mmol/L (ref 101–111)
Creatinine, Ser: 0.84 mg/dL (ref 0.61–1.24)
GLUCOSE: 118 mg/dL — AB (ref 65–99)
POTASSIUM: 3.6 mmol/L (ref 3.5–5.1)
Sodium: 140 mmol/L (ref 135–145)

## 2015-10-14 LAB — CBC
HCT: 36.3 % — ABNORMAL LOW (ref 39.0–52.0)
HEMOGLOBIN: 13 g/dL (ref 13.0–17.0)
MCH: 35.8 pg — ABNORMAL HIGH (ref 26.0–34.0)
MCHC: 35.8 g/dL (ref 30.0–36.0)
MCV: 100 fL (ref 78.0–100.0)
PLATELETS: 156 10*3/uL (ref 150–400)
RBC: 3.63 MIL/uL — AB (ref 4.22–5.81)
RDW: 13.1 % (ref 11.5–15.5)
WBC: 5.1 10*3/uL (ref 4.0–10.5)

## 2015-10-14 LAB — ECHOCARDIOGRAM COMPLETE
Height: 68 in
WEIGHTICAEL: 2320 [oz_av]

## 2015-10-14 LAB — HEPARIN LEVEL (UNFRACTIONATED)
HEPARIN UNFRACTIONATED: 0.65 [IU]/mL (ref 0.30–0.70)
Heparin Unfractionated: 0.71 IU/mL — ABNORMAL HIGH (ref 0.30–0.70)

## 2015-10-14 MED ORDER — SODIUM CHLORIDE 0.9% FLUSH: 3.0000 mL | INTRAVENOUS | Status: DC | PRN

## 2015-10-14 MED ORDER — CARVEDILOL 3.125 MG PO TABS
3.1250 mg | ORAL_TABLET | Freq: Two times a day (BID) | ORAL | Status: DC
  Administered 2015-10-14 – 2015-10-16 (×4): 3.125 mg via ORAL
  Filled 2015-10-14 (×4): qty 1

## 2015-10-14 MED ORDER — SODIUM CHLORIDE 0.9% FLUSH
3.0000 mL | Freq: Two times a day (BID) | INTRAVENOUS | Status: DC
  Administered 2015-10-14 – 2015-10-19 (×6): 3 mL via INTRAVENOUS

## 2015-10-14 MED ORDER — SODIUM CHLORIDE 0.9 % IV SOLN: 250.0000 mL | INTRAVENOUS | Status: DC

## 2015-10-14 MED ORDER — SODIUM CHLORIDE 0.9 % IV SOLN
INTRAVENOUS | Status: DC
  Administered 2015-10-15: 500 mL via INTRAVENOUS

## 2015-10-14 NOTE — Progress Notes (Signed)
ANTICOAGULATION CONSULT NOTE - Follow Up Consult  Pharmacy Consult for Heparin Indication: atrial fibrillation  No Known Allergies  Patient Measurements: Height: 5\' 8"  (172.7 cm) Weight: 145 lb (65.772 kg) IBW/kg (Calculated) : 68.4 Heparin Dosing Weight: 66 kg  Vital Signs: Temp: 98.2 F (36.8 C) (03/09 0428) Temp Source: Oral (03/09 0428) BP: 117/81 mmHg (03/09 0428) Pulse Rate: 113 (03/09 0428)  Labs:  Recent Labs  10/12/15 1041  10/13/15 0350 10/13/15 1340 10/13/15 2150 10/14/15 0450  HGB 15.9  --  13.8  --   --  13.0  HCT 46.6  --  40.4  --   --  36.3*  PLT 181  --  167  --   --  156  HEPARINUNFRC  --   < >  --  0.22* 0.35 0.71*  CREATININE 0.99  --  0.84  --   --  0.84  TROPONINI 0.03  --   --   --   --   --   < > = values in this interval not displayed.  Estimated Creatinine Clearance: 88.1 mL/min (by C-G formula based on Cr of 0.84).  Assessment: 59 YOM on heparin for afib. Heparin level 0.71 (slightly supratherapeutic). CBC stable. No bleeding noted.   Goal of Therapy:  Heparin level 0.3-0.7 units/ml Monitor platelets by anticoagulation protocol: Yes   Plan:  Continue heparin 1350 units/hr F/u 6 hr confirmatory level to make sure not trending up further  Sherlon Handing, PharmD, BCPS Clinical pharmacist, pager [phone] 10/14/2015,6:09 AM

## 2015-10-14 NOTE — Progress Notes (Signed)
SUBJECTIVE:  No complaints  OBJECTIVE:   Vitals:   Filed Vitals:   10/14/15 0428 10/14/15 0757 10/14/15 0800 10/14/15 1119  BP: 117/81 116/80 120/83 113/81  Pulse: 113 123 123 121  Temp: 98.2 F (36.8 C) 98.4 F (36.9 C)  97.4 F (36.3 C)  TempSrc: Oral Oral  Oral  Resp: 17 15 12 17   Height:      Weight:      SpO2: 97% 95% 96% 97%   I&O's:   Intake/Output Summary (Last 24 hours) at 10/14/15 1403 Last data filed at 10/14/15 1300  Gross per 24 hour  Intake   3055 ml  Output   1000 ml  Net   2055 ml   TELEMETRY: Reviewed telemetry pt in atrial fibrillation with RVR     PHYSICAL EXAM General: Well developed, well nourished, in no acute distress Head: Eyes PERRLA, No xanthomas.   Normal cephalic and atramatic  Lungs:   Clear bilaterally to auscultation and percussion. Heart:   irreguarly irregular and tachy S1 S2 Pulses are 2+ & equal. Abdomen: Bowel sounds are positive, abdomen soft and non-tender without masses Extremities:   No clubbing, cyanosis or edema.  DP +1 Neuro: Alert and oriented X 3. Psych:  Good affect, responds appropriately   LABS: Basic Metabolic Panel:  Recent Labs  10/12/15 2041 10/13/15 0350 10/14/15 0450  NA  --  139 140  K  --  3.8 3.6  CL  --  109 110  CO2  --  22 22  GLUCOSE  --  106* 118*  BUN  --  8 7  CREATININE  --  0.84 0.84  CALCIUM  --  8.5* 8.3*  MG  --  1.9  --   PHOS 3.1  --   --    Liver Function Tests:  Recent Labs  10/12/15 1041  AST 20  ALT 21  ALKPHOS 66  BILITOT 0.9  PROT 6.9  ALBUMIN 3.4*    Recent Labs  10/12/15 1041  LIPASE 21   CBC:  Recent Labs  10/13/15 0350 10/14/15 0450  WBC 7.6 5.1  HGB 13.8 13.0  HCT 40.4 36.3*  MCV 99.3 100.0  PLT 167 156   Cardiac Enzymes:  Recent Labs  10/12/15 1041  TROPONINI 0.03   BNP: Invalid input(s): POCBNP D-Dimer: No results for input(s): DDIMER in the last 72 hours. Hemoglobin A1C: No results for input(s): HGBA1C in the last 72  hours. Fasting Lipid Panel: No results for input(s): CHOL, HDL, LDLCALC, TRIG, CHOLHDL, LDLDIRECT in the last 72 hours. Thyroid Function Tests:  Recent Labs  10/12/15 1829  TSH 2.180   Anemia Panel: No results for input(s): VITAMINB12, FOLATE, FERRITIN, TIBC, IRON, RETICCTPCT in the last 72 hours. Coag Panel:   No results found for: INR, PROTIME  RADIOLOGY: Ct Abdomen Pelvis W Contrast  10/12/2015  CLINICAL DATA:  Onset of lower abdominal / upper pelvic pain for the past 4 days. History of constipation and diarrhea. History of tonsillar cancer. EXAM: CT ABDOMEN AND PELVIS WITH CONTRAST TECHNIQUE: Multidetector CT imaging of the abdomen and pelvis was performed using the standard protocol following bolus administration of intravenous contrast. CONTRAST:  76mL OMNIPAQUE IOHEXOL 300 MG/ML  SOLN COMPARISON:  PET-CT - 12/28/2014 FINDINGS: Extensive diverticulosis involving the sigmoid colon with geographic area of wall thickening, adjacent mesenteric stranding and vascular congestion within the mid/distal aspect of the sigmoid colon (representative axial image 68, series 2), compatible with an area of acute uncomplicated diverticulitis. No  evidence of perforation or definable/drainable fluid collection. No evidence of enteric obstruction. No pneumoperitoneum, pneumatosis or portal venous gas. The bowel is otherwise normal in course and caliber without discrete area of wall thickening. Normal appearance of the terminal ileum and appendix. Normal hepatic contour. Punctate (approximately 0.5 cm) hypoattenuating lesion within the right lobe of the liver (image 21, series 2) is too small to accurately characterize though favored to represent a hepatic cyst. Normal appearance of the gallbladder given degree distention. No radiopaque gallstones. No intra extrahepatic biliary duct dilatation. No ascites. There is symmetric enhancement and excretion of the bilateral kidneys. No definite renal stones in this  postcontrast examination. No discrete renal lesions. No urine obstruction a perinephric stranding. Normal appearance of the bilateral adrenal glands, pancreas and spleen. Scattered minimal amount of calcified atherosclerotic plaque within a normal caliber abdominal aorta. The major branch vessels of the abdominal aorta appear patent on this non CTA examination. Scattered retroperitoneal lymph nodes are individually not enlarged by size criteria. No retroperitoneal, mesenteric, pelvic or inguinal lymphadenopathy. Limited visualization of the lower thorax demonstrates a punctate (approximately 0.5 cm) nodule within the left lower lobe (image 7, series 3) grossly unchanged since PET-CT performed 12/2004 and thus of benign etiology. Minimal dependent subpleural ground-glass atelectasis. No focal airspace opacities. No pleural effusion. Normal heart size. Trace amount of pericardial fluid, presumably physiologic. No acute or aggressive osseous abnormalities. No acute or aggressive osseous abnormalities. Note is made of partial lumbarization of the S1 vertebral body. There is moderate multilevel lumbar spine DDD, worse at L5-S1 with disc space height loss, endplate irregularity and sclerosis. Regional soft tissues are normal. IMPRESSION: 1. Acute uncomplicated diverticulitis involving the mid/distal aspect of the sigmoid colon. No evidence of perforation or definable/drainable fluid collection. No evidence of enteric obstruction. If not recently performed, further evaluation with colonoscopy after the resolution of acute symptoms is recommended to exclude the presence of an underlying colonic malignancy. 2. Punctate (approximately 5 mm) left lower lobe pulmonary nodule is unchanged since the 12/2004 examination and thus of benign etiology. Electronically Signed   By: Sandi Mariscal M.D.   On: 10/12/2015 13:01   Dg Chest Portable 1 View  10/12/2015  CLINICAL DATA:  Abdominal pain, no bowel movement for 4 days, abnormal EKG,  tachycardia, history tonsillar cancer post chemotherapy and radiation therapy, smoker EXAM: PORTABLE CHEST 1 VIEW COMPARISON:  Portable exam 1145 hours compared to 10/26/2010 FINDINGS: Lordotic positioning. Normal heart size, mediastinal contours, and pulmonary vascularity. Prominent first costochondral junctions bilaterally. Lungs clear. No pleural effusion or pneumothorax. Bones unremarkable. IMPRESSION: No acute abnormalities. Electronically Signed   By: Lavonia Dana M.D.   On: 10/12/2015 12:11   ASSESSMENT/PLAN: 1. New onset Atrial flutter with RVR of unknown duration. Suspect AFL related to underlying illness but also at higher risk for atrial dysrhythmias due to heavy ETOH use. He did not respond to IV Cardizem so now on IV Amio gtt.  This patients CHA2DS2-VASc Score and unadjusted Ischemic Stroke Rate (% per year) is equal to 0.2 % stroke rate/year from a score of 1. Above score calculated as 1 point each if present [CHF, HTN, DM, Vascular=MI/PAD/Aortic Plaque, Age if 65-74, or Male]. Above score calculated as 2 points each if present [Age > 75, or Stroke/TIA/TE]. . Continue on IV Heparin gtt for now.HR not adequately controlled despite IV Amio and boluses and still around 135bpm.  Will plan TEE/DCCV tomorrow.  Risks of procedure explained to patient including, esophageal perforation, bleeding, aspiration, risks of  anesthesia.  Patient understands and wishes to proceed.  Will plan to convert to Xarelto once back in NSR if no ischemia on nuclear stress test and no further invasive testing is needed.  Continue IV Amio for now for rate control and add Coreg 3.125mg  BID.  Will convert to PO Amio after DCCV. 2.  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  EF 30-35% with mild LVH and severe HK of the basal and mid anteroseptal/anterior/anterolateral and  apical anterior and lateral walls.  RV moderately dilated and moderately reduced RVF.  Add Coreg 3.125mg  BID and if BP tolerates will add on ACE I.  Will make NPO after MN for Lexiscan myoview in am to rule out ischemia.   3. Acute diverticulitis per IM 4. ETOH abuse ~ 5 beers per day - counseled on need to quit and cardiac effects of alcohol abuse 5. Ongoing tobacco abuse 6. H/o Curlene Labrum, MD  10/14/2015  2:03 PM

## 2015-10-14 NOTE — Progress Notes (Addendum)
[facility] PROGRESS NOTE  PRAISE ELSMORE [id number] DOB: 01/31/1956 DOA: 10/12/2015  PCP: [name], MD  Brief HPI: 60 year old Caucasian male with history of hypothyroidism who does drink significant amounts of alcohol in a daily basis, presented to the emergency department with complaints of abdominal pain and discomfort. He was seen previously at the urgent care center. Patient was found to be in atrial flutter with RVR. CT scan showed acute diverticulitis. He was hospitalized for further management.  Past medical history:  Past Medical History  Diagnosis Date  . Radiation NOv.3,2005-Dec. 15, 2005    6810 cGy in 30 fractions  . 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 History of chemotherapy 2005    Cisplatin  . Thyroid disease     Hypothyroid    Consultants: Cardiology  Procedures:  Echocardiogram Study Conclusions - Left ventricle: The cavity size was normal. There was mild concentric hypertrophy. Systolic function was moderately to severely reduced. The estimated ejection fraction was in the range of 30% to 35%. There is severe hypokinesis in the basal and mid anteroseptal, anterior, anterolateral and apical anterior and lateral walls. The study was not technically sufficient to allow evaluation of LV diastolic dysfunction due to atrial fibrillation. - Aortic valve: Trileaflet; normal thickness leaflets. There was no regurgitation. - Mitral valve: There was trivial regurgitation. - Right ventricle: The cavity size was normal. Wall thickness was normal. Systolic function was mildly to moderately reduced. - Right atrium: The atrium was normal in size. - Inferior vena cava: The vessel was normal in size. - Pericardium, extracardiac: There was no pericardial effusion.  Antibiotics: Unasyn  Subjective: Patient feels better. Denies any nausea, vomiting. His abdominal pain is improved. Hasn't had a bowel movement yet. He is passing flatus. Denies any chest pain or shortness  of breath.   Objective: Vital Signs  Filed Vitals:   10/14/15 0000 10/14/15 0056 10/14/15 0100 10/14/15 0428  BP: 123/94 123/94 105/81 117/81  Pulse: 119 116 119 113  Temp:  98.4 F (36.9 C)  98.2 F (36.8 C)  TempSrc:  Oral  Oral  Resp: 20 15 16 17   Height:      Weight:      SpO2: 94% 96% 94% 97%    Intake/Output Summary (Last 24 hours) at 10/14/15 0746 Last data filed at 10/14/15 0429  Gross per 24 hour  Intake 1011.4 ml  Output   1350 ml  Net -338.6 ml   Filed Weights   10/12/15 1029  Weight: 65.772 kg (145 lb)    General appearance: alert, cooperative, appears stated age and no distress Resp: clear to auscultation bilaterally Cardio: S1, S2 is tachycardic. Regular. No S3, S4. No rubs, murmurs, or bruit. No pedal edema. GI: Abdomen is soft. Less tender compared to yesterday. No masses or organomegaly. Bowel sounds are present. Extremities: extremities normal, atraumatic, no cyanosis or edema Neurologic: Awake and alert. Oriented 3. No focal neurological deficits.  Lab Results:  Basic Metabolic Panel:  Recent Labs Lab 10/12/15 1041 10/12/15 2041 10/13/15 0350 10/14/15 0450  NA 139  --  139 140  K 4.1  --  3.8 3.6  CL 106  --  109 110  CO2 24  --  22 22  GLUCOSE 102*  --  106* 118*  BUN 12  --  8 7  CREATININE 0.99  --  0.84 0.84  CALCIUM 9.0  --  8.5* 8.3*  MG  --   --  1.9  --   PHOS  --  3.1  --   --    Liver Function Tests:  Recent Labs Lab 10/12/15 1041  AST 20  ALT 21  ALKPHOS 66  BILITOT 0.9  PROT 6.9  ALBUMIN 3.4*    Recent Labs Lab 10/12/15 1041  LIPASE 21   CBC:  Recent Labs Lab 10/12/15 1041 10/13/15 0350 10/14/15 0450  WBC 11.0* 7.6 5.1  HGB 15.9 13.8 13.0  HCT 46.6 40.4 36.3*  MCV 100.2* 99.3 100.0  PLT 181 167 156   Cardiac Enzymes:  Recent Labs Lab 10/12/15 1041  TROPONINI 0.03   BNP (last 3 results)  Recent Labs  10/12/15 1151  BNP 115.8*     Recent Results (from the past 240 hour(s))  MRSA  PCR Screening     Status: None   Collection Time: 10/13/15  2:37 AM  Result Value Ref Range Status   MRSA by PCR NEGATIVE NEGATIVE Final    Comment:        The GeneXpert MRSA Assay (FDA approved for NASAL specimens only), is one component of a comprehensive MRSA colonization surveillance program. It is not intended to diagnose MRSA infection nor to guide or monitor treatment for MRSA infections.       Studies/Results: Ct Abdomen Pelvis W Contrast  10/12/2015  CLINICAL DATA:  Onset of lower abdominal / upper pelvic pain for the past 4 days. History of constipation and diarrhea. History of tonsillar cancer. EXAM: CT ABDOMEN AND PELVIS WITH CONTRAST TECHNIQUE: Multidetector CT imaging of the abdomen and pelvis was performed using the standard protocol following bolus administration of intravenous contrast. CONTRAST:  108mL OMNIPAQUE IOHEXOL 300 MG/ML  SOLN COMPARISON:  PET-CT - 12/28/2014 FINDINGS: Extensive diverticulosis involving the sigmoid colon with geographic area of wall thickening, adjacent mesenteric stranding and vascular congestion within the mid/distal aspect of the sigmoid colon (representative axial image 68, series 2), compatible with an area of acute uncomplicated diverticulitis. No evidence of perforation or definable/drainable fluid collection. No evidence of enteric obstruction. No pneumoperitoneum, pneumatosis or portal venous gas. The bowel is otherwise normal in course and caliber without discrete area of wall thickening. Normal appearance of the terminal ileum and appendix. Normal hepatic contour. Punctate (approximately 0.5 cm) hypoattenuating lesion within the right lobe of the liver (image 21, series 2) is too small to accurately characterize though favored to represent a hepatic cyst. Normal appearance of the gallbladder given degree distention. No radiopaque gallstones. No intra extrahepatic biliary duct dilatation. No ascites. There is symmetric enhancement and excretion  of the bilateral kidneys. No definite renal stones in this postcontrast examination. No discrete renal lesions. No urine obstruction a perinephric stranding. Normal appearance of the bilateral adrenal glands, pancreas and spleen. Scattered minimal amount of calcified atherosclerotic plaque within a normal caliber abdominal aorta. The major branch vessels of the abdominal aorta appear patent on this non CTA examination. Scattered retroperitoneal lymph nodes are individually not enlarged by size criteria. No retroperitoneal, mesenteric, pelvic or inguinal lymphadenopathy. Limited visualization of the lower thorax demonstrates a punctate (approximately 0.5 cm) nodule within the left lower lobe (image 7, series 3) grossly unchanged since PET-CT performed 12/2004 and thus of benign etiology. Minimal dependent subpleural ground-glass atelectasis. No focal airspace opacities. No pleural effusion. Normal heart size. Trace amount of pericardial fluid, presumably physiologic. No acute or aggressive osseous abnormalities. No acute or aggressive osseous abnormalities. Note is made of partial lumbarization of the S1 vertebral body. There is moderate multilevel lumbar spine DDD, worse at L5-S1 with disc space height  loss, endplate irregularity and sclerosis. Regional soft tissues are normal. IMPRESSION: 1. Acute uncomplicated diverticulitis involving the mid/distal aspect of the sigmoid colon. No evidence of perforation or definable/drainable fluid collection. No evidence of enteric obstruction. If not recently performed, further evaluation with colonoscopy after the resolution of acute symptoms is recommended to exclude the presence of an underlying colonic malignancy. 2. Punctate (approximately 5 mm) left lower lobe pulmonary nodule is unchanged since the 12/2004 examination and thus of benign etiology. Electronically Signed   By: Sandi Mariscal M.D.   On: 10/12/2015 13:01   Dg Chest Portable 1 View  10/12/2015  CLINICAL DATA:   Abdominal pain, no bowel movement for 4 days, abnormal EKG, tachycardia, history tonsillar cancer post chemotherapy and radiation therapy, smoker EXAM: PORTABLE CHEST 1 VIEW COMPARISON:  Portable exam 1145 hours compared to 10/26/2010 FINDINGS: Lordotic positioning. Normal heart size, mediastinal contours, and pulmonary vascularity. Prominent first costochondral junctions bilaterally. Lungs clear. No pleural effusion or pneumothorax. Bones unremarkable. IMPRESSION: No acute abnormalities. Electronically Signed   By: Lavonia Dana M.D.   On: 10/12/2015 12:11    Medications:  Scheduled: . ampicillin-sulbactam (UNASYN) IV  3 g Intravenous [id number]  . folic acid  1 mg Oral Daily  . levothyroxine  125 mcg Oral QAC breakfast  . multivitamin with minerals  1 tablet Oral Daily  . sodium chloride flush  3 mL Intravenous Q12H  . tamsulosin  0.4 mg Oral BID  . thiamine  100 mg Oral Daily   Or  . thiamine  100 mg Intravenous Daily   Continuous: . sodium chloride 100 mL/hr at 10/14/15 0132  . amiodarone 30 mg/hr (10/14/15 0431)  . heparin 1,350 Units/hr (10/14/15 0133)   [id number] **OR** LORazepam, metoprolol, morphine injection, oxyCODONE  Assessment/Plan:  Active Problems:   Atrial flutter with rapid ventricular response (HCC)   Diverticulitis   Diverticulitis of intestine without perforation or abscess without bleeding   Thyroid activity decreased    New onset Atrial flutter with RVR Cardiology is following. Patient remains on amiodarone infusion. Patient is also on IV heparin. Echocardiogram report reviewed. Patient is noted to have reduced systolic function. Cardiology to weigh in. TSH is normal. Long-term anticoagulation per cardiology.   Acute diverticulitis Patient is slowly improving. Continue Unasyn. Patient tolerating clear liquids. Okay to advance to soft diet. Patient reports having had a colonoscopy 2 years ago which apparently was unremarkable. He can follow-up with his primary  care provider to discuss this further.  Systolic CHF, likely chronic Appears to be well compensated. Cutback on IV fluids. Management per cardiology. Consider ACE inhibitor.  Left lower lobe pulmonary nodule. Appears to be stable compared to imaging studies from more than 10 years ago and hence thought to be benign. Outpatient follow-up.  Hypothyroidism. Continue with Synthroid. He will need close monitoring of his thyroid function tests if amiodarone is used long-term.   Alcohol abuse Patient was counseled. Continue CIWA protocol.  DVT Prophylaxis: Currently on IV heparin    Code Status: Full code  Family Communication: Discussed with the patient  Disposition Plan: Await improvement in heart rate, and abdominal symptoms. He'll remain in step down for now.    LOS: 2 days   [facility] Pager [phone] 10/14/2015, 7:46 AM  If 7PM-7AM, please contact night-coverage at [email or web address], password [facility]

## 2015-10-14 NOTE — Progress Notes (Signed)
ANTICOAGULATION CONSULT NOTE - Follow Up Consult  Pharmacy Consult for Heparin Indication: atrial fibrillation  No Known Allergies  Patient Measurements: Height: 5\' 8"  (172.7 cm) Weight: 145 lb (65.772 kg) IBW/kg (Calculated) : 68.4 Heparin Dosing Weight: 66kg  Vital Signs: Temp: 97.4 F (36.3 C) (03/09 1119) Temp Source: Oral (03/09 1119) BP: 113/81 mmHg (03/09 1119) Pulse Rate: 121 (03/09 1119)  Labs:  Recent Labs  10/12/15 1041  10/13/15 0350  10/13/15 2150 10/14/15 0450 10/14/15 1251  HGB 15.9  --  13.8  --   --  13.0  --   HCT 46.6  --  40.4  --   --  36.3*  --   PLT 181  --  167  --   --  156  --   HEPARINUNFRC  --   < >  --   < > 0.35 0.71* 0.65  CREATININE 0.99  --  0.84  --   --  0.84  --   TROPONINI 0.03  --   --   --   --   --   --   < > = values in this interval not displayed.  Estimated Creatinine Clearance: 88.1 mL/min (by C-G formula based on Cr of 0.84).   Assessment: 31 YOM admitted 10/12/2015  with acute diverticulitis and found to be in AF.   Anticoag: Aflutter. Heparin level  0.22 >0.35>0.71>0.65. Hgb 15.9>13. - Not candidate for longterm anticoagulation once back in NSR per cards note.  Goal of Therapy:  Heparin level 0.3-0.7 units/ml Monitor platelets by anticoagulation protocol: Yes   Plan:  Continue IV heparin at 1350 units/hr Daily HL and CBC    Adrian [name], PharmD, BCPS Clinical Staff Pharmacist Pager [phone]  Adrian [name] 10/14/2015,1:27 PM

## 2015-10-14 NOTE — Telephone Encounter (Signed)
Patient needs Short Term Disability completed for his employer due to being out of work 5 days straight, I have completed what I could from [place] notes and highlighted the areas that need to be completed, please fill out and return to the [facility] box at the 102 checkout desk within 5-7 business days. I will place in your box on 10/14/15 in a purple FMLA folder.Thank you!

## 2015-10-15 ENCOUNTER — Inpatient Hospital Stay (HOSPITAL_COMMUNITY): Payer: BLUE CROSS/BLUE SHIELD | Admitting: Anesthesiology

## 2015-10-15 ENCOUNTER — Encounter (HOSPITAL_COMMUNITY): Payer: Self-pay | Admitting: Certified Registered Nurse Anesthetist

## 2015-10-15 ENCOUNTER — Ambulatory Visit (HOSPITAL_COMMUNITY): Payer: BLUE CROSS/BLUE SHIELD

## 2015-10-15 ENCOUNTER — Encounter (HOSPITAL_COMMUNITY)
Admission: EM | Disposition: A | Discharge status: Home / Self Care | Payer: Self-pay | Source: Home / Self Care | Attending: Internal Medicine

## 2015-10-15 DIAGNOSIS — I4891 Unspecified atrial fibrillation: Secondary | ICD-10-CM

## 2015-10-15 HISTORY — PX: TEE WITHOUT CARDIOVERSION: SHX5443

## 2015-10-15 LAB — BASIC METABOLIC PANEL
Anion gap: 8 (ref 5–15)
BUN: 8 mg/dL (ref 6–20)
CALCIUM: 8.6 mg/dL — AB (ref 8.9–10.3)
CO2: 23 mmol/L (ref 22–32)
CREATININE: 0.89 mg/dL (ref 0.61–1.24)
Chloride: 111 mmol/L (ref 101–111)
Glucose, Bld: 108 mg/dL — ABNORMAL HIGH (ref 65–99)
Potassium: 3.8 mmol/L (ref 3.5–5.1)
SODIUM: 142 mmol/L (ref 135–145)

## 2015-10-15 LAB — CBC
HCT: 36.5 % — ABNORMAL LOW (ref 39.0–52.0)
Hemoglobin: 12.2 g/dL — ABNORMAL LOW (ref 13.0–17.0)
MCH: 33.4 pg (ref 26.0–34.0)
MCHC: 33.4 g/dL (ref 30.0–36.0)
MCV: 100 fL (ref 78.0–100.0)
Platelets: 158 10*3/uL (ref 150–400)
RBC: 3.65 MIL/uL — ABNORMAL LOW (ref 4.22–5.81)
RDW: 13.2 % (ref 11.5–15.5)
WBC: 5.1 10*3/uL (ref 4.0–10.5)

## 2015-10-15 LAB — HEPARIN LEVEL (UNFRACTIONATED): HEPARIN UNFRACTIONATED: 0.58 [IU]/mL (ref 0.30–0.70)

## 2015-10-15 SURGERY — ECHOCARDIOGRAM, TRANSESOPHAGEAL
Anesthesia: Monitor Anesthesia Care

## 2015-10-15 MED ORDER — LIDOCAINE HCL (CARDIAC) 20 MG/ML IV SOLN
INTRAVENOUS | Status: DC | PRN
  Administered 2015-10-15: 20 mg via INTRATRACHEAL
  Administered 2015-10-15: 60 mg via INTRATRACHEAL

## 2015-10-15 MED ORDER — PHENYLEPHRINE HCL 10 MG/ML IJ SOLN
INTRAMUSCULAR | Status: DC | PRN
  Administered 2015-10-15 (×2): 40 ug via INTRAVENOUS

## 2015-10-15 MED ORDER — AMOXICILLIN-POT CLAVULANATE 875-125 MG PO TABS
1.0000 | ORAL_TABLET | Freq: Two times a day (BID) | ORAL | Status: DC
  Administered 2015-10-15 – 2015-10-20 (×10): 1 via ORAL
  Filled 2015-10-15 (×14): qty 1

## 2015-10-15 MED ORDER — PROPOFOL 10 MG/ML IV BOLUS
INTRAVENOUS | Status: DC | PRN
  Administered 2015-10-15: 20 mg via INTRAVENOUS
  Administered 2015-10-15 (×2): 10 mg via INTRAVENOUS
  Administered 2015-10-15 (×3): 20 mg via INTRAVENOUS
  Administered 2015-10-15: 30 mg via INTRAVENOUS
  Administered 2015-10-15: 20 mg via INTRAVENOUS

## 2015-10-15 MED ORDER — BUTAMBEN-TETRACAINE-BENZOCAINE 2-2-14 % EX AERO
INHALATION_SPRAY | CUTANEOUS | Status: DC | PRN
  Administered 2015-10-15: 2 via TOPICAL

## 2015-10-15 NOTE — H&P (View-Only) (Signed)
SUBJECTIVE:  No complaints  OBJECTIVE:   Vitals:   Filed Vitals:   10/14/15 2200 10/14/15 2300 10/15/15 0000 10/15/15 0400  BP: 97/64 107/66 106/86 115/70  Pulse: 79 78 109 80  Temp:   97.4 F (36.3 C) 98.3 F (36.8 C)  TempSrc:   Oral Oral  Resp: 19 19 19 15   Height:      Weight:      SpO2: 96% 92% 99% 95%   I&O's:   Intake/Output Summary (Last 24 hours) at 10/15/15 1016 Last data filed at 10/15/15 0600  Gross per 24 hour  Intake 1265.7 ml  Output      2 ml  Net 1263.7 ml   TELEMETRY: Reviewed telemetry pt in Atrial flutter at 90-110bpm     PHYSICAL EXAM General: Well developed, well nourished, in no acute distress Head: Eyes PERRLA, No xanthomas.   Normal cephalic and atramatic  Lungs:   Clear bilaterally to auscultation and percussion. Heart:   HRRR tachy S1 S2 Pulses are 2+ & equal. Abdomen: Bowel sounds are positive, abdomen soft and non-tender without masses Extremities:   No clubbing, cyanosis or edema.  DP +1 Neuro: Alert and oriented X 3. Psych:  Good affect, responds appropriately   LABS: Basic Metabolic Panel:  Recent Labs  10/12/15 2041 10/13/15 0350 10/14/15 0450 10/15/15 0520  NA  --  139 140 142  K  --  3.8 3.6 3.8  CL  --  109 110 111  CO2  --  22 22 23   GLUCOSE  --  106* 118* 108*  BUN  --  8 7 8   CREATININE  --  0.84 0.84 0.89  CALCIUM  --  8.5* 8.3* 8.6*  MG  --  1.9  --   --   PHOS 3.1  --   --   --    Liver Function Tests:  Recent Labs  10/12/15 1041  AST 20  ALT 21  ALKPHOS 66  BILITOT 0.9  PROT 6.9  ALBUMIN 3.4*    Recent Labs  10/12/15 1041  LIPASE 21   CBC:  Recent Labs  10/14/15 0450 10/15/15 0520  WBC 5.1 5.1  HGB 13.0 12.2*  HCT 36.3* 36.5*  MCV 100.0 100.0  PLT 156 158   Cardiac Enzymes:  Recent Labs  10/12/15 1041  TROPONINI 0.03   BNP: Invalid input(s): POCBNP D-Dimer: No results for input(s): DDIMER in the last 72 hours. Hemoglobin A1C: No results for input(s): HGBA1C in the last  72 hours. Fasting Lipid Panel: No results for input(s): CHOL, HDL, LDLCALC, TRIG, CHOLHDL, LDLDIRECT in the last 72 hours. Thyroid Function Tests:  Recent Labs  10/12/15 1829  TSH 2.180   Anemia Panel: No results for input(s): VITAMINB12, FOLATE, FERRITIN, TIBC, IRON, RETICCTPCT in the last 72 hours. Coag Panel:   No results found for: INR, PROTIME  RADIOLOGY: Ct Abdomen Pelvis W Contrast  10/12/2015  CLINICAL DATA:  Onset of lower abdominal / upper pelvic pain for the past 4 days. History of constipation and diarrhea. History of tonsillar cancer. EXAM: CT ABDOMEN AND PELVIS WITH CONTRAST TECHNIQUE: Multidetector CT imaging of the abdomen and pelvis was performed using the standard protocol following bolus administration of intravenous contrast. CONTRAST:  31mL OMNIPAQUE IOHEXOL 300 MG/ML  SOLN COMPARISON:  PET-CT - 12/28/2014 FINDINGS: Extensive diverticulosis involving the sigmoid colon with geographic area of wall thickening, adjacent mesenteric stranding and vascular congestion within the mid/distal aspect of the sigmoid colon (representative axial image 68, series  2), compatible with an area of acute uncomplicated diverticulitis. No evidence of perforation or definable/drainable fluid collection. No evidence of enteric obstruction. No pneumoperitoneum, pneumatosis or portal venous gas. The bowel is otherwise normal in course and caliber without discrete area of wall thickening. Normal appearance of the terminal ileum and appendix. Normal hepatic contour. Punctate (approximately 0.5 cm) hypoattenuating lesion within the right lobe of the liver (image 21, series 2) is too small to accurately characterize though favored to represent a hepatic cyst. Normal appearance of the gallbladder given degree distention. No radiopaque gallstones. No intra extrahepatic biliary duct dilatation. No ascites. There is symmetric enhancement and excretion of the bilateral kidneys. No definite renal stones in this  postcontrast examination. No discrete renal lesions. No urine obstruction a perinephric stranding. Normal appearance of the bilateral adrenal glands, pancreas and spleen. Scattered minimal amount of calcified atherosclerotic plaque within a normal caliber abdominal aorta. The major branch vessels of the abdominal aorta appear patent on this non CTA examination. Scattered retroperitoneal lymph nodes are individually not enlarged by size criteria. No retroperitoneal, mesenteric, pelvic or inguinal lymphadenopathy. Limited visualization of the lower thorax demonstrates a punctate (approximately 0.5 cm) nodule within the left lower lobe (image 7, series 3) grossly unchanged since PET-CT performed 12/2004 and thus of benign etiology. Minimal dependent subpleural ground-glass atelectasis. No focal airspace opacities. No pleural effusion. Normal heart size. Trace amount of pericardial fluid, presumably physiologic. No acute or aggressive osseous abnormalities. No acute or aggressive osseous abnormalities. Note is made of partial lumbarization of the S1 vertebral body. There is moderate multilevel lumbar spine DDD, worse at L5-S1 with disc space height loss, endplate irregularity and sclerosis. Regional soft tissues are normal. IMPRESSION: 1. Acute uncomplicated diverticulitis involving the mid/distal aspect of the sigmoid colon. No evidence of perforation or definable/drainable fluid collection. No evidence of enteric obstruction. If not recently performed, further evaluation with colonoscopy after the resolution of acute symptoms is recommended to exclude the presence of an underlying colonic malignancy. 2. Punctate (approximately 5 mm) left lower lobe pulmonary nodule is unchanged since the 12/2004 examination and thus of benign etiology. Electronically Signed   By: Sandi Mariscal M.D.   On: 10/12/2015 13:01   Dg Chest Portable 1 View  10/12/2015  CLINICAL DATA:  Abdominal pain, no bowel movement for 4 days, abnormal EKG,  tachycardia, history tonsillar cancer post chemotherapy and radiation therapy, smoker EXAM: PORTABLE CHEST 1 VIEW COMPARISON:  Portable exam 1145 hours compared to 10/26/2010 FINDINGS: Lordotic positioning. Normal heart size, mediastinal contours, and pulmonary vascularity. Prominent first costochondral junctions bilaterally. Lungs clear. No pleural effusion or pneumothorax. Bones unremarkable. IMPRESSION: No acute abnormalities. Electronically Signed   By: Lavonia Dana M.D.   On: 10/12/2015 12:11    ASSESSMENT/PLAN: 1. New onset Atrial flutter with RVR of unknown duration. Suspect AFL related to underlying illness but also at higher risk for atrial dysrhythmias due to heavy ETOH use. He did not respond to IV Cardizem so now on IV Amio gtt.  This patients CHA2DS2-VASc Score and unadjusted Ischemic Stroke Rate (% per year) is equal to 0.2 % stroke rate/year from a score of 1. Above score calculated as 1 point each if present [CHF, HTN, DM, Vascular=MI/PAD/Aortic Plaque, Age if 65-74, or Male]. Above score calculated as 2 points each if present [Age > 75, or Stroke/TIA/TE]. . Continue on IV Heparin gtt for now.HR improved on BB but not adequately controlled when sitting up moving around despite IV Amio. Will plan TEE/DCCV today.  Will plan to convert to Xarelto once back in NSR.  Continue IV Amio for now for rate control and Coreg 3.125mg  BID. Will convert to PO Amio after DCCV.  2. 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 EF 30-35% with mild LVH and severe HK of the basal and mid anteroseptal/anterior/anterolateral and apical anterior and lateral walls. RV moderately dilated and moderately reduced RVF. Continue Coreg 3.125mg  BID and if BP tolerates will add on ACE I. Will get outpt nuclear stress test once he has recovered from his  diverticulitis.   3. Acute diverticulitis per IM 4. ETOH abuse ~ 5 beers per day - counseled on need to quit and cardiac effects of alcohol abuse 5. Ongoing tobacco abuse 6. H/o Curlene Labrum, MD  10/15/2015  10:16 AM

## 2015-10-15 NOTE — Care Management Note (Signed)
Case Management Note  Patient Details  Name: Adrian [name] MRN: [id number] Date of Birth: Feb 09, 1956  Subjective/Objective:     Patient is from home alone, pta indep, patient for cardioversion and TEE.  NCM will cont to follow for dc needs.               Action/Plan:   Expected Discharge Date:                  Expected Discharge Plan:  Home/Self Care  In-House Referral:     Discharge planning Services  CM Consult  Post Acute Care Choice:    Choice offered to:     DME Arranged:    DME Agency:     HH Arranged:    [place] Agency:     Status of Service:  Completed, signed off  Medicare Important Message Given:    Date Medicare IM Given:    Medicare IM give by:    Date Additional Medicare IM Given:    Additional Medicare Important Message give by:     If discussed at [place] of Stay Meetings, dates discussed:    Additional Comments:  Zenon Mayo, RN 10/15/2015, 3:19 PM

## 2015-10-15 NOTE — Progress Notes (Signed)
ANTICOAGULATION CONSULT NOTE - Follow Up Consult  Pharmacy Consult for Heparin Indication: atrial fibrillation  No Known Allergies  Patient Measurements: Height: 5\' 8"  (172.7 cm) Weight: 145 lb (65.772 kg) IBW/kg (Calculated) : 68.4 Heparin Dosing Weight: 66kg  Vital Signs: Temp: 98.3 F (36.8 C) (03/10 0400) Temp Source: Oral (03/10 0400) BP: 115/70 mmHg (03/10 0400) Pulse Rate: 80 (03/10 0400)  Labs:  Recent Labs  10/12/15 1041  10/13/15 0350  10/14/15 0450 10/14/15 1251 10/15/15 0520  HGB 15.9  --  13.8  --  13.0  --  12.2*  HCT 46.6  --  40.4  --  36.3*  --  36.5*  PLT 181  --  167  --  156  --  158  HEPARINUNFRC  --   < >  --   < > 0.71* 0.65 0.58  CREATININE 0.99  --  0.84  --  0.84  --  0.89  TROPONINI 0.03  --   --   --   --   --   --   < > = values in this interval not displayed.  Estimated Creatinine Clearance: 83.2 mL/min (by C-G formula based on Cr of 0.89).   Assessment: 60 yo M admitted 10/12/2015  with acute diverticulitis and found to be in AF.   Heparin level remains therapeutic on 1350 units/hr.  Noted plans for TEE/DCCV today.  Goal of Therapy:  Heparin level 0.3-0.7 units/ml Monitor platelets by anticoagulation protocol: Yes   Plan:  Continue IV heparin at 1350 units/hr Follow up plans for ocal anticoag with Xarelto? Daily heparin level and CBC  Breanna Shorkey, Pharm.D., BCPS Clinical Pharmacist Pager [phone] 10/15/2015 10:34 AM

## 2015-10-15 NOTE — Progress Notes (Signed)
[facility] PROGRESS NOTE  KALVEN ARGENT [id number] DOB: 06-26-1956 DOA: 10/12/2015  PCP: [name], MD  Brief HPI: 60 year old Caucasian male with history of hypothyroidism who does drink significant amounts of alcohol in a daily basis, presented to the emergency department with complaints of abdominal pain and discomfort. He was seen previously at the urgent care center. Patient was found to be in atrial flutter with RVR. CT scan showed acute diverticulitis. He was hospitalized for further management.  Past medical history:  Past Medical History  Diagnosis Date  . Radiation NOv.3,2005-Dec. 15, 2005    6810 cGy in 30 fractions  . 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 History of chemotherapy 2005    Cisplatin  . Thyroid disease     Hypothyroid  . DCM (dilated cardiomyopathy) ([place]) 10/14/2015    Consultants: Cardiology  Procedures:  Echocardiogram Study Conclusions - Left ventricle: The cavity size was normal. There was mild concentric hypertrophy. Systolic function was moderately to severely reduced. The estimated ejection fraction was in the range of 30% to 35%. There is severe hypokinesis in the basal and mid anteroseptal, anterior, anterolateral and apical anterior and lateral walls. The study was not technically sufficient to allow evaluation of LV diastolic dysfunction due to atrial fibrillation. - Aortic valve: Trileaflet; normal thickness leaflets. There was no regurgitation. - Mitral valve: There was trivial regurgitation. - Right ventricle: The cavity size was normal. Wall thickness was normal. Systolic function was mildly to moderately reduced. - Right atrium: The atrium was normal in size. - Inferior vena cava: The vessel was normal in size. - Pericardium, extracardiac: There was no pericardial effusion.  Antibiotics: Unasyn  Subjective: Patient denies any chest pain or shortness of breath. Abdominal pain is improved. Denies any nausea or vomiting. Having some  loose stools at times.   Objective: Vital Signs  Filed Vitals:   10/14/15 2200 10/14/15 2300 10/15/15 0000 10/15/15 0400  BP: 97/64 107/66 106/86 115/70  Pulse: 79 78 109 80  Temp:   97.4 F (36.3 C) 98.3 F (36.8 C)  TempSrc:   Oral Oral  Resp: 19 19 19 15   Height:      Weight:      SpO2: 96% 92% 99% 95%    Intake/Output Summary (Last 24 hours) at 10/15/15 0804 Last data filed at 10/15/15 0600  Gross per 24 hour  Intake 1365.7 ml  Output      2 ml  Net 1363.7 ml   Filed Weights   10/12/15 1029  Weight: 65.772 kg (145 lb)    General appearance: alert, cooperative, appears stated age and no distress Resp: clear to auscultation bilaterally Cardio: S1, S2 is tachycardic. Regular. No S3, S4. No rubs, murmurs, or bruit. No pedal edema. GI: Abdomen is soft. Less tender. No masses or organomegaly. Bowel sounds are present. Extremities: extremities normal, atraumatic, no cyanosis or edema Neurologic: Awake and alert. Oriented 3. No focal neurological deficits.  Lab Results:  Basic Metabolic Panel:  Recent Labs Lab 10/12/15 1041 10/12/15 2041 10/13/15 0350 10/14/15 0450 10/15/15 0520  NA 139  --  139 140 142  K 4.1  --  3.8 3.6 3.8  CL 106  --  109 110 111  CO2 24  --  22 22 23   GLUCOSE 102*  --  106* 118* 108*  BUN 12  --  8 7 8   CREATININE 0.99  --  0.84 0.84 0.89  CALCIUM 9.0  --  8.5* 8.3* 8.6*  MG  --   --  1.9  --   --   PHOS  --  3.1  --   --   --    Liver Function Tests:  Recent Labs Lab 10/12/15 1041  AST 20  ALT 21  ALKPHOS 66  BILITOT 0.9  PROT 6.9  ALBUMIN 3.4*    Recent Labs Lab 10/12/15 1041  LIPASE 21   CBC:  Recent Labs Lab 10/12/15 1041 10/13/15 0350 10/14/15 0450 10/15/15 0520  WBC 11.0* 7.6 5.1 5.1  HGB 15.9 13.8 13.0 12.2*  HCT 46.6 40.4 36.3* 36.5*  MCV 100.2* 99.3 100.0 100.0  PLT 181 167 156 158   Cardiac Enzymes:  Recent Labs Lab 10/12/15 1041  TROPONINI 0.03   BNP (last 3 results)  Recent Labs   10/12/15 1151  BNP 115.8*     Recent Results (from the past 240 hour(s))  MRSA PCR Screening     Status: None   Collection Time: 10/13/15  2:37 AM  Result Value Ref Range Status   MRSA by PCR NEGATIVE NEGATIVE Final    Comment:        The GeneXpert MRSA Assay (FDA approved for NASAL specimens only), is one component of a comprehensive MRSA colonization surveillance program. It is not intended to diagnose MRSA infection nor to guide or monitor treatment for MRSA infections.       Studies/Results: No results found.  Medications:  Scheduled: . ampicillin-sulbactam (UNASYN) IV  3 g Intravenous Q6H  . carvedilol  3.125 mg Oral BID WC  . folic acid  1 mg Oral Daily  . levothyroxine  125 mcg Oral QAC breakfast  . multivitamin with minerals  1 tablet Oral Daily  . sodium chloride flush  3 mL Intravenous Q12H  . sodium chloride flush  3 mL Intravenous Q12H  . tamsulosin  0.4 mg Oral BID  . thiamine  100 mg Oral Daily   Or  . thiamine  100 mg Intravenous Daily   Continuous: . sodium chloride 10 mL/hr at 10/14/15 0756  . sodium chloride    . sodium chloride    . amiodarone 30 mg/hr (10/15/15 0600)  . heparin 1,350 Units/hr (10/15/15 0600)   [id number] **OR** LORazepam, metoprolol, morphine injection, oxyCODONE, sodium chloride flush  Assessment/Plan:  Active Problems:   Atrial flutter with rapid ventricular response (HCC)   Diverticulitis   Diverticulitis of intestine without perforation or abscess without bleeding   Thyroid activity decreased   DCM (dilated cardiomyopathy) ([place])    New onset Atrial flutter with RVR Patient remains in atrial flutter. Cardiology is following. Patient remains on amiodarone infusion. Patient is also on IV heparin. Echocardiogram report reviewed. Patient is noted to have reduced systolic function. Cardiology to do ischemic workup as outpatient. TSH is normal. Long-term anticoagulation per cardiology.   Acute  diverticulitis Patient is slowly improving. He is tolerating his diet. Change to oral antibiotics tonight. Patient reports having had a colonoscopy 2 years ago which apparently was unremarkable. He can follow-up with his primary care provider to discuss this further.  Systolic CHF, likely chronic Appears to be well compensated. Cutback on IV fluids. Management per cardiology. Has been started on Coreg. ACE inhibitor to be considered if the patient's blood pressure tolerates.   Left lower lobe pulmonary nodule. Appears to be stable compared to imaging studies from more than 10 years ago and hence thought to be benign. Outpatient follow-up.  Hypothyroidism. Continue with Synthroid. He will need close monitoring of his thyroid function tests if amiodarone is used  long-term.   Alcohol abuse Patient was counseled. Continue CIWA protocol.  DVT Prophylaxis: Currently on IV heparin    Code Status: Full code  Family Communication: Discussed with the patient  Disposition Plan: Await improvement in heart rate, and abdominal symptoms. He'll remain in step down for now.    LOS: 3 days   [facility] Pager [phone] 10/15/2015, 8:04 AM  If 7PM-7AM, please contact night-coverage at [email or web address], password [facility]

## 2015-10-15 NOTE — Anesthesia Postprocedure Evaluation (Signed)
Anesthesia Post Note  Patient: Adrian [name]  Procedure(s) Performed: Procedure(s) (LRB): TRANSESOPHAGEAL ECHOCARDIOGRAM (TEE) (N/A)  Patient location during evaluation: PACU Anesthesia Type: MAC Level of consciousness: awake and alert Pain management: pain level controlled Vital Signs Assessment: post-procedure vital signs reviewed and stable Respiratory status: spontaneous breathing, nonlabored ventilation, respiratory function stable and patient connected to nasal cannula oxygen Cardiovascular status: stable and blood pressure returned to baseline Anesthetic complications: no    Last Vitals:  Filed Vitals:   10/15/15 1113 10/15/15 1227  BP: 145/107 110/80  Pulse: 89 87  Temp: 36.7 C 36.4 C  Resp: 20 17    Last Pain:  Filed Vitals:   10/15/15 1237  PainSc: 0-No pain                 Effie Berkshire

## 2015-10-15 NOTE — CV Procedure (Signed)
Procedure: TEE  Indication: Atrial flutter.  Sedation: Per anesthesiology.  Findings: Please see echo section for full report.  Normal LV size with diffuse hypokinesis, EF 30-35%.  Normal RV size with mildly decreased systolic function.  Trivial TR, trivial MR, trivial PI.  Trileaflet aortic valve with no stenosis or significant regurgitation.  Mild left atrial enlargement.  There was "sludge" in the left atrial appendage => more formed than smoke.  Normal right atrium.  Normal caliber aorta with minimal plaque.   Impression: 1. LV EF 30-35%, diffuse hypokinesis.  2. "Sludge" in LA appendage.  More formed than smoke.  Discussed images with Dr [name], will hold off cardioversion.  Would re-attempt after 4 weeks of therapeutic anticoagulation.   Loralie Champagne 10/15/2015

## 2015-10-15 NOTE — Progress Notes (Signed)
SUBJECTIVE:  No complaints  OBJECTIVE:   Vitals:   Filed Vitals:   10/14/15 2200 10/14/15 2300 10/15/15 0000 10/15/15 0400  BP: 97/64 107/66 106/86 115/70  Pulse: 79 78 109 80  Temp:   97.4 F (36.3 C) 98.3 F (36.8 C)  TempSrc:   Oral Oral  Resp: 19 19 19 15   Height:      Weight:      SpO2: 96% 92% 99% 95%   I&O's:   Intake/Output Summary (Last 24 hours) at 10/15/15 1016 Last data filed at 10/15/15 0600  Gross per 24 hour  Intake 1265.7 ml  Output      2 ml  Net 1263.7 ml   TELEMETRY: Reviewed telemetry pt in Atrial flutter at 90-110bpm     PHYSICAL EXAM General: Well developed, well nourished, in no acute distress Head: Eyes PERRLA, No xanthomas.   Normal cephalic and atramatic  Lungs:   Clear bilaterally to auscultation and percussion. Heart:   HRRR tachy S1 S2 Pulses are 2+ & equal. Abdomen: Bowel sounds are positive, abdomen soft and non-tender without masses Extremities:   No clubbing, cyanosis or edema.  DP +1 Neuro: Alert and oriented X 3. Psych:  Good affect, responds appropriately   LABS: Basic Metabolic Panel:  Recent Labs  10/12/15 2041 10/13/15 0350 10/14/15 0450 10/15/15 0520  NA  --  139 140 142  K  --  3.8 3.6 3.8  CL  --  109 110 111  CO2  --  22 22 23   GLUCOSE  --  106* 118* 108*  BUN  --  8 7 8   CREATININE  --  0.84 0.84 0.89  CALCIUM  --  8.5* 8.3* 8.6*  MG  --  1.9  --   --   PHOS 3.1  --   --   --    Liver Function Tests:  Recent Labs  10/12/15 1041  AST 20  ALT 21  ALKPHOS 66  BILITOT 0.9  PROT 6.9  ALBUMIN 3.4*    Recent Labs  10/12/15 1041  LIPASE 21   CBC:  Recent Labs  10/14/15 0450 10/15/15 0520  WBC 5.1 5.1  HGB 13.0 12.2*  HCT 36.3* 36.5*  MCV 100.0 100.0  PLT 156 158   Cardiac Enzymes:  Recent Labs  10/12/15 1041  TROPONINI 0.03   BNP: Invalid input(s): POCBNP D-Dimer: No results for input(s): DDIMER in the last 72 hours. Hemoglobin A1C: No results for input(s): HGBA1C in the last  72 hours. Fasting Lipid Panel: No results for input(s): CHOL, HDL, LDLCALC, TRIG, CHOLHDL, LDLDIRECT in the last 72 hours. Thyroid Function Tests:  Recent Labs  10/12/15 1829  TSH 2.180   Anemia Panel: No results for input(s): VITAMINB12, FOLATE, FERRITIN, TIBC, IRON, RETICCTPCT in the last 72 hours. Coag Panel:   No results found for: INR, PROTIME  RADIOLOGY: Ct Abdomen Pelvis W Contrast  10/12/2015  CLINICAL DATA:  Onset of lower abdominal / upper pelvic pain for the past 4 days. History of constipation and diarrhea. History of tonsillar cancer. EXAM: CT ABDOMEN AND PELVIS WITH CONTRAST TECHNIQUE: Multidetector CT imaging of the abdomen and pelvis was performed using the standard protocol following bolus administration of intravenous contrast. CONTRAST:  56mL OMNIPAQUE IOHEXOL 300 MG/ML  SOLN COMPARISON:  PET-CT - 12/28/2014 FINDINGS: Extensive diverticulosis involving the sigmoid colon with geographic area of wall thickening, adjacent mesenteric stranding and vascular congestion within the mid/distal aspect of the sigmoid colon (representative axial image 68, series  2), compatible with an area of acute uncomplicated diverticulitis. No evidence of perforation or definable/drainable fluid collection. No evidence of enteric obstruction. No pneumoperitoneum, pneumatosis or portal venous gas. The bowel is otherwise normal in course and caliber without discrete area of wall thickening. Normal appearance of the terminal ileum and appendix. Normal hepatic contour. Punctate (approximately 0.5 cm) hypoattenuating lesion within the right lobe of the liver (image 21, series 2) is too small to accurately characterize though favored to represent a hepatic cyst. Normal appearance of the gallbladder given degree distention. No radiopaque gallstones. No intra extrahepatic biliary duct dilatation. No ascites. There is symmetric enhancement and excretion of the bilateral kidneys. No definite renal stones in this  postcontrast examination. No discrete renal lesions. No urine obstruction a perinephric stranding. Normal appearance of the bilateral adrenal glands, pancreas and spleen. Scattered minimal amount of calcified atherosclerotic plaque within a normal caliber abdominal aorta. The major branch vessels of the abdominal aorta appear patent on this non CTA examination. Scattered retroperitoneal lymph nodes are individually not enlarged by size criteria. No retroperitoneal, mesenteric, pelvic or inguinal lymphadenopathy. Limited visualization of the lower thorax demonstrates a punctate (approximately 0.5 cm) nodule within the left lower lobe (image 7, series 3) grossly unchanged since PET-CT performed 12/2004 and thus of benign etiology. Minimal dependent subpleural ground-glass atelectasis. No focal airspace opacities. No pleural effusion. Normal heart size. Trace amount of pericardial fluid, presumably physiologic. No acute or aggressive osseous abnormalities. No acute or aggressive osseous abnormalities. Note is made of partial lumbarization of the S1 vertebral body. There is moderate multilevel lumbar spine DDD, worse at L5-S1 with disc space height loss, endplate irregularity and sclerosis. Regional soft tissues are normal. IMPRESSION: 1. Acute uncomplicated diverticulitis involving the mid/distal aspect of the sigmoid colon. No evidence of perforation or definable/drainable fluid collection. No evidence of enteric obstruction. If not recently performed, further evaluation with colonoscopy after the resolution of acute symptoms is recommended to exclude the presence of an underlying colonic malignancy. 2. Punctate (approximately 5 mm) left lower lobe pulmonary nodule is unchanged since the 12/2004 examination and thus of benign etiology. Electronically Signed   By: Sandi Mariscal M.D.   On: 10/12/2015 13:01   Dg Chest Portable 1 View  10/12/2015  CLINICAL DATA:  Abdominal pain, no bowel movement for 4 days, abnormal EKG,  tachycardia, history tonsillar cancer post chemotherapy and radiation therapy, smoker EXAM: PORTABLE CHEST 1 VIEW COMPARISON:  Portable exam 1145 hours compared to 10/26/2010 FINDINGS: Lordotic positioning. Normal heart size, mediastinal contours, and pulmonary vascularity. Prominent first costochondral junctions bilaterally. Lungs clear. No pleural effusion or pneumothorax. Bones unremarkable. IMPRESSION: No acute abnormalities. Electronically Signed   By: Lavonia Dana M.D.   On: 10/12/2015 12:11    ASSESSMENT/PLAN: 1. New onset Atrial flutter with RVR of unknown duration. Suspect AFL related to underlying illness but also at higher risk for atrial dysrhythmias due to heavy ETOH use. He did not respond to IV Cardizem so now on IV Amio gtt.  This patients CHA2DS2-VASc Score and unadjusted Ischemic Stroke Rate (% per year) is equal to 0.2 % stroke rate/year from a score of 1. Above score calculated as 1 point each if present [CHF, HTN, DM, Vascular=MI/PAD/Aortic Plaque, Age if 65-74, or Male]. Above score calculated as 2 points each if present [Age > 75, or Stroke/TIA/TE]. . Continue on IV Heparin gtt for now.HR improved on BB but not adequately controlled when sitting up moving around despite IV Amio. Will plan TEE/DCCV today.  Will plan to convert to Xarelto once back in NSR.  Continue IV Amio for now for rate control and Coreg 3.125mg  BID. Will convert to PO Amio after DCCV.  2. 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 EF 30-35% with mild LVH and severe HK of the basal and mid anteroseptal/anterior/anterolateral and apical anterior and lateral walls. RV moderately dilated and moderately reduced RVF. Continue Coreg 3.125mg  BID and if BP tolerates will add on ACE I. Will get outpt nuclear stress test once he has recovered from his  diverticulitis.   3. Acute diverticulitis per IM 4. ETOH abuse ~ 5 beers per day - counseled on need to quit and cardiac effects of alcohol abuse 5. Ongoing tobacco abuse 6. H/o Curlene Labrum, MD  10/15/2015  10:16 AM

## 2015-10-15 NOTE — Anesthesia Preprocedure Evaluation (Addendum)
Anesthesia Evaluation  Patient identified by MRN, date of birth, ID band Patient awake    Reviewed: Allergy & Precautions, NPO status , Patient's Chart, lab work & pertinent test results  Airway Mallampati: II  TM Distance: >3 FB Neck ROM: Full    Dental  (+) Teeth Intact, Dental Advisory Given   Pulmonary Current Smoker,    breath sounds clear to auscultation       Cardiovascular negative cardio ROS   Rhythm:Irregular Rate:Abnormal     Neuro/Psych negative neurological ROS  negative psych ROS   GI/Hepatic negative GI ROS, Neg liver ROS,   Endo/Other  Hypothyroidism   Renal/GU negative Renal ROS  negative genitourinary   Musculoskeletal negative musculoskeletal ROS (+)   Abdominal   Peds negative pediatric ROS (+)  Hematology negative hematology ROS (+)   Anesthesia Other Findings   Reproductive/Obstetrics negative OB ROS                           Lab Results  Component Value Date   WBC 5.1 10/15/2015   HGB 12.2* 10/15/2015   HCT 36.5* 10/15/2015   MCV 100.0 10/15/2015   PLT 158 10/15/2015   Lab Results  Component Value Date   CREATININE 0.89 10/15/2015   BUN 8 10/15/2015   NA 142 10/15/2015   K 3.8 10/15/2015   CL 111 10/15/2015   CO2 23 10/15/2015   No results found for: INR, PROTIME  10/2015 EKG: atrial fibrillation.  10/2015 Echo - Left ventricle: The cavity size was normal. There was mild concentric hypertrophy. Systolic function was moderately to severely reduced. The estimated ejection fraction was in the range of 30% to 35%. There is severe hypokinesis in the basal and mid anteroseptal, anterior, anterolateral and apical anterior and lateral walls. The study was not technically sufficient to allow evaluation of LV diastolic dysfunction due to atrial fibrillation. - Aortic valve: Trileaflet; normal thickness leaflets. There was no regurgitation. - Mitral valve:  There was trivial regurgitation. - Right ventricle: The cavity size was normal. Wall thickness was normal. Systolic function was mildly to moderately reduced. - Right atrium: The atrium was normal in size. - Inferior vena cava: The vessel was normal in size. - Pericardium, extracardiac: There was no pericardial effusion.     Anesthesia Physical Anesthesia Plan  ASA: III  Anesthesia Plan: MAC   Post-op Pain Management:    Induction: Intravenous  Airway Management Planned: Natural Airway and Nasal Cannula  Additional Equipment:   Intra-op Plan:   Post-operative Plan:   Informed Consent: I have reviewed the patients History and Physical, chart, labs and discussed the procedure including the risks, benefits and alternatives for the proposed anesthesia with the patient or authorized representative who has indicated his/her understanding and acceptance.     Plan Discussed with: CRNA  Anesthesia Plan Comments:         Anesthesia Quick Evaluation

## 2015-10-15 NOTE — Transfer of Care (Signed)
Immediate Anesthesia Transfer of Care Note  Patient: Adrian [name]  Procedure(s) Performed: Procedure(s): TRANSESOPHAGEAL ECHOCARDIOGRAM (TEE) (N/A)  Patient Location: Endoscopy Unit  Anesthesia Type:MAC  Level of Consciousness: awake, alert  and oriented  Airway & Oxygen Therapy: Patient Spontanous Breathing and Patient connected to nasal cannula oxygen  Post-op Assessment: Report given to RN and Post -op Vital signs reviewed and stable  Post vital signs: Reviewed and stable  Last Vitals:  Filed Vitals:   10/15/15 1100 10/15/15 1113  BP: 138/110 145/107  Pulse: 127 89  Temp:  36.7 C  Resp: 27 20    Complications: No apparent anesthesia complications

## 2015-10-15 NOTE — Progress Notes (Signed)
Echocardiogram Echocardiogram Transesophageal has been performed.  Adrian [name] 10/15/2015, 12:37 PM

## 2015-10-15 NOTE — Interval H&P Note (Signed)
History and Physical Interval Note:  10/15/2015 12:00 PM  Adrian [name]  has presented today for surgery, with the diagnosis of AFIB  The various methods of treatment have been discussed with the patient and family. After consideration of risks, benefits and other options for treatment, the patient has consented to  Procedure(s): TRANSESOPHAGEAL ECHOCARDIOGRAM (TEE) (N/A) CARDIOVERSION (N/A) as a surgical intervention .  The patient's history has been reviewed, patient examined, no change in status, stable for surgery.  I have reviewed the patient's chart and labs.  Questions were answered to the patient's satisfaction.     Sandara Tyree [employer]

## 2015-10-16 ENCOUNTER — Encounter (HOSPITAL_COMMUNITY): Payer: Self-pay | Admitting: Cardiology

## 2015-10-16 LAB — CBC
HCT: 38.4 % — ABNORMAL LOW (ref 39.0–52.0)
Hemoglobin: 12.7 g/dL — ABNORMAL LOW (ref 13.0–17.0)
MCH: 33.5 pg (ref 26.0–34.0)
MCHC: 33.1 g/dL (ref 30.0–36.0)
MCV: 101.3 fL — AB (ref 78.0–100.0)
PLATELETS: 163 10*3/uL (ref 150–400)
RBC: 3.79 MIL/uL — AB (ref 4.22–5.81)
RDW: 13.1 % (ref 11.5–15.5)
WBC: 4.9 10*3/uL (ref 4.0–10.5)

## 2015-10-16 LAB — BASIC METABOLIC PANEL
Anion gap: 8 (ref 5–15)
BUN: 9 mg/dL (ref 6–20)
CHLORIDE: 108 mmol/L (ref 101–111)
CO2: 26 mmol/L (ref 22–32)
CREATININE: 0.94 mg/dL (ref 0.61–1.24)
Calcium: 8.6 mg/dL — ABNORMAL LOW (ref 8.9–10.3)
GFR calc Af Amer: >60 mL/min (ref >60)
GLUCOSE: 100 mg/dL — AB (ref 65–99)
POTASSIUM: 3.7 mmol/L (ref 3.5–5.1)
Sodium: 142 mmol/L (ref 135–145)

## 2015-10-16 LAB — HEPARIN LEVEL (UNFRACTIONATED): Heparin Unfractionated: 0.62 IU/mL (ref 0.30–0.70)

## 2015-10-16 MED ORDER — POTASSIUM CHLORIDE CRYS ER 20 MEQ PO TBCR
40.0000 meq | EXTENDED_RELEASE_TABLET | Freq: Once | ORAL | Status: AC
  Administered 2015-10-16: 40 meq via ORAL
  Filled 2015-10-16: qty 2

## 2015-10-16 MED ORDER — CARVEDILOL 6.25 MG PO TABS
6.2500 mg | ORAL_TABLET | Freq: Two times a day (BID) | ORAL | Status: DC
  Administered 2015-10-16 – 2015-10-17 (×3): 6.25 mg via ORAL
  Filled 2015-10-16 (×3): qty 1

## 2015-10-16 NOTE — Progress Notes (Signed)
ANTICOAGULATION CONSULT NOTE - Follow Up Consult  Pharmacy Consult for Heparin Indication: atrial fibrillation  Not on File  Patient Measurements: Height: 5\' 8"  (172.7 cm) Weight: 145 lb (65.772 kg) IBW/kg (Calculated) : 68.4 Heparin Dosing Weight: 66kg  Vital Signs: Temp: 98 F (36.7 C) (03/11 0800) Temp Source: Oral (03/11 0800) BP: 129/74 mmHg (03/11 0800) Pulse Rate: 77 (03/11 0800)  Labs:  Recent Labs  10/14/15 0450 10/14/15 1251 10/15/15 0520 10/16/15 0459  HGB 13.0  --  12.2* 12.7*  HCT 36.3*  --  36.5* 38.4*  PLT 156  --  158 163  HEPARINUNFRC 0.71* 0.65 0.58 0.62  CREATININE 0.84  --  0.89 0.94    Estimated Creatinine Clearance: 78.8 mL/min (by C-G formula based on Cr of 0.94).   Assessment: 60 yo M admitted 10/12/2015 with acute diverticulitis and found to be in AF. Planned TEE/DCCV yesterday, however TEE showed sludge in LAA and cardioversion aborted.  Heparin level therapeutic at 0.62 on 1350 units/hr. hgb stable, plt wnl. No noted bleeding.   Goal of Therapy:  Heparin level 0.3-0.7 units/ml Monitor platelets by anticoagulation protocol: Yes   Plan:  Continue heparin 1350 units/hr Daily heparin level and CBC Monitor s/sx bleeding F/u transition to oral anticoagulation    Heloise Ochoa, Pharm.D., BCPS PGY2 Cardiology Pharmacy Resident Pager: [phone]  10/16/2015 9:28 AM

## 2015-10-16 NOTE — Progress Notes (Signed)
[facility] PROGRESS NOTE  KLAYTEN YOSHIMOTO [id number] DOB: 07-03-1956 DOA: 10/12/2015  PCP: [name], MD  Brief HPI: 60 year old Caucasian male with history of hypothyroidism who does drink significant amounts of alcohol in a daily basis, presented to the emergency department with complaints of abdominal pain and discomfort. He was seen previously at the urgent care center. Patient was found to be in atrial flutter with RVR. CT scan showed acute diverticulitis. He was hospitalized for further management.  Past medical history:  Past Medical History  Diagnosis Date  . Radiation NOv.3,2005-Dec. 15, 2005    6810 cGy in 30 fractions  . 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 History of chemotherapy 2005    Cisplatin  . Thyroid disease     Hypothyroid  . DCM (dilated cardiomyopathy) ([place]) 10/14/2015    Consultants: Cardiology  Procedures:  Echocardiogram Study Conclusions - Left ventricle: The cavity size was normal. There was mild concentric hypertrophy. Systolic function was moderately to severely reduced. The estimated ejection fraction was in the range of 30% to 35%. There is severe hypokinesis in the basal and mid anteroseptal, anterior, anterolateral and apical anterior and lateral walls. The study was not technically sufficient to allow evaluation of LV diastolic dysfunction due to atrial fibrillation. - Aortic valve: Trileaflet; normal thickness leaflets. There was no regurgitation. - Mitral valve: There was trivial regurgitation. - Right ventricle: The cavity size was normal. Wall thickness was normal. Systolic function was mildly to moderately reduced. - Right atrium: The atrium was normal in size. - Inferior vena cava: The vessel was normal in size. - Pericardium, extracardiac: There was no pericardial effusion.  Antibiotics: Unasyn was changed to Augmentin on 3/10  Subjective: Patient feels well. Occasional loose stools. Denies any chest pain or shortness of breath.  Abdominal pain is improved. Denies any nausea or vomiting.   Objective: Vital Signs  Filed Vitals:   10/15/15 1800 10/15/15 1900 10/15/15 2349 10/16/15 0425  BP: 138/87  113/76 116/68  Pulse: 91  79 76  Temp:  98.8 F (37.1 C) 97.9 F (36.6 C) 98 F (36.7 C)  TempSrc:  Oral Oral Oral  Resp: 20  17 15   Height:      Weight:      SpO2: 100%  94% 96%    Intake/Output Summary (Last 24 hours) at 10/16/15 0758 Last data filed at 10/16/15 0600  Gross per 24 hour  Intake 1148.47 ml  Output      0 ml  Net 1148.47 ml   Filed Weights   10/12/15 1029 10/15/15 1113  Weight: 65.772 kg (145 lb) 65.772 kg (145 lb)    General appearance: alert, cooperative, appears stated age and no distress Resp: clear to auscultation bilaterally Cardio: S1, S2 is tachycardic. Regular. No S3, S4. No rubs, murmurs, or bruit. No pedal edema. GI: Abdomen remains soft. Less tender. No masses or organomegaly. Bowel sounds are present. Extremities: extremities normal, atraumatic, no cyanosis or edema Neurologic: Awake and alert. Oriented 3. No focal neurological deficits.  Lab Results:  Basic Metabolic Panel:  Recent Labs Lab 10/12/15 1041 10/12/15 2041 10/13/15 0350 10/14/15 0450 10/15/15 0520 10/16/15 0459  NA 139  --  139 140 142 142  K 4.1  --  3.8 3.6 3.8 3.7  CL 106  --  109 110 111 108  CO2 24  --  22 22 23 26   GLUCOSE 102*  --  106* 118* 108* 100*  BUN 12  --  8 7 8  9  CREATININE 0.99  --  0.84 0.84 0.89 0.94  CALCIUM 9.0  --  8.5* 8.3* 8.6* 8.6*  MG  --   --  1.9  --   --   --   PHOS  --  3.1  --   --   --   --    Liver Function Tests:  Recent Labs Lab 10/12/15 1041  AST 20  ALT 21  ALKPHOS 66  BILITOT 0.9  PROT 6.9  ALBUMIN 3.4*    Recent Labs Lab 10/12/15 1041  LIPASE 21   CBC:  Recent Labs Lab 10/12/15 1041 10/13/15 0350 10/14/15 0450 10/15/15 0520 10/16/15 0459  WBC 11.0* 7.6 5.1 5.1 4.9  HGB 15.9 13.8 13.0 12.2* 12.7*  HCT 46.6 40.4 36.3* 36.5* 38.4*    MCV 100.2* 99.3 100.0 100.0 101.3*  PLT 181 167 156 158 163   Cardiac Enzymes:  Recent Labs Lab 10/12/15 1041  TROPONINI 0.03   BNP (last 3 results)  Recent Labs  10/12/15 1151  BNP 115.8*     Recent Results (from the past 240 hour(s))  MRSA PCR Screening     Status: None   Collection Time: 10/13/15  2:37 AM  Result Value Ref Range Status   MRSA by PCR NEGATIVE NEGATIVE Final    Comment:        The GeneXpert MRSA Assay (FDA approved for NASAL specimens only), is one component of a comprehensive MRSA colonization surveillance program. It is not intended to diagnose MRSA infection nor to guide or monitor treatment for MRSA infections.       Studies/Results: No results found.  Medications:  Scheduled: . amoxicillin-clavulanate  1 tablet Oral Q12H  . carvedilol  3.125 mg Oral BID WC  . folic acid  1 mg Oral Daily  . levothyroxine  125 mcg Oral QAC breakfast  . multivitamin with minerals  1 tablet Oral Daily  . potassium chloride  40 mEq Oral Once  . sodium chloride flush  3 mL Intravenous Q12H  . sodium chloride flush  3 mL Intravenous Q12H  . tamsulosin  0.4 mg Oral BID  . thiamine  100 mg Oral Daily   Or  . thiamine  100 mg Intravenous Daily   Continuous: . sodium chloride 10 mL/hr at 10/15/15 1512  . sodium chloride    . amiodarone 30 mg/hr (10/16/15 0600)  . heparin 1,350 Units/hr (10/16/15 0600)   [id number], morphine injection, oxyCODONE, sodium chloride flush  Assessment/Plan:  Active Problems:   Atrial flutter with rapid ventricular response (HCC)   Diverticulitis   Diverticulitis of intestine without perforation or abscess without bleeding   Thyroid activity decreased   DCM (dilated cardiomyopathy) ([place])    New onset Atrial flutter with RVR Patient remains in atrial flutter. Cardiology is following. Patient remains on amiodarone infusion. Patient is also on IV heparin. Echocardiogram report reviewed. TEE was done 3/10 revealing EF  of 30-35% and "sludge" in LA appendage. So cardioversion was not attempted. This will be tried after 4 weeks of anticoagulation. Patient is noted to have reduced systolic function. Cardiology to do ischemic workup as outpatient. TSH is normal. Long-term anticoagulation and rate/rhythm control per cardiology.   Acute diverticulitis Patient is improving. He is tolerating his diet. Changed to oral antibiotics. Patient reports having had a colonoscopy 2 years ago which apparently was unremarkable. He can follow-up with his primary care provider to discuss this further.  Systolic CHF, likely chronic Appears to be well compensated. Cutback on IV fluids.  Management per cardiology. Has been started on Coreg. ACE inhibitor to be considered if the patient's blood pressure tolerates.   Left lower lobe pulmonary nodule. Appears to be stable compared to imaging studies from more than 10 years ago and hence thought to be benign. Outpatient follow-up.  Hypothyroidism. Continue with Synthroid. He will need close monitoring of his thyroid function tests if amiodarone is used long-term.   Alcohol abuse Patient was counseled. Continue CIWA protocol.  DVT Prophylaxis: Currently on IV heparin    Code Status: Full code  Family Communication: Discussed with the patient  Disposition Plan: Await improvement in heart rate, and abdominal symptoms. He'll remain in step down for now.    LOS: 4 days   [facility] Pager [phone] 10/16/2015, 7:58 AM  If 7PM-7AM, please contact night-coverage at [email or web address], password [facility]

## 2015-10-16 NOTE — Progress Notes (Signed)
SUBJECTIVE:  No complaints  OBJECTIVE:   Vitals:   Filed Vitals:   10/15/15 2349 10/16/15 0425 10/16/15 0800 10/16/15 1128  BP: 113/76 116/68 129/74 117/90  Pulse: 79 76 77 77  Temp: 97.9 F (36.6 C) 98 F (36.7 C) 98 F (36.7 C) 98 F (36.7 C)  TempSrc: Oral Oral Oral Oral  Resp: 17 15 17 21   Height:      Weight:      SpO2: 94% 96% 93% 100%   I&O's:   Intake/Output Summary (Last 24 hours) at 10/16/15 1316 Last data filed at 10/16/15 1200  Gross per 24 hour  Intake 783.24 ml  Output      0 ml  Net 783.24 ml   TELEMETRY: Reviewed telemetry pt in atrial flutter with CVR:     PHYSICAL EXAM General: Well developed, well nourished, in no acute distress Head: Eyes PERRLA, No xanthomas.   Normal cephalic and atramatic  Lungs:   Clear bilaterally to auscultation and percussion. Heart:   HRRR S1 S2 Pulses are 2+ & equal. Abdomen: Bowel sounds are positive, abdomen soft and non-tender without masses Neuro: Alert and oriented X 3. Psych:  Good affect, responds appropriately   LABS: Basic Metabolic Panel:  Recent Labs  10/15/15 0520 10/16/15 0459  NA 142 142  K 3.8 3.7  CL 111 108  CO2 23 26  GLUCOSE 108* 100*  BUN 8 9  CREATININE 0.89 0.94  CALCIUM 8.6* 8.6*   Liver Function Tests: No results for input(s): AST, ALT, ALKPHOS, BILITOT, PROT, ALBUMIN in the last 72 hours. No results for input(s): LIPASE, AMYLASE in the last 72 hours. CBC:  Recent Labs  10/15/15 0520 10/16/15 0459  WBC 5.1 4.9  HGB 12.2* 12.7*  HCT 36.5* 38.4*  MCV 100.0 101.3*  PLT 158 163   Cardiac Enzymes: No results for input(s): CKTOTAL, CKMB, CKMBINDEX, TROPONINI in the last 72 hours. BNP: Invalid input(s): POCBNP D-Dimer: No results for input(s): DDIMER in the last 72 hours. Hemoglobin A1C: No results for input(s): HGBA1C in the last 72 hours. Fasting Lipid Panel: No results for input(s): CHOL, HDL, LDLCALC, TRIG, CHOLHDL, LDLDIRECT in the last 72 hours. Thyroid Function  Tests: No results for input(s): TSH, T4TOTAL, T3FREE, THYROIDAB in the last 72 hours.  Invalid input(s): FREET3 Anemia Panel: No results for input(s): VITAMINB12, FOLATE, FERRITIN, TIBC, IRON, RETICCTPCT in the last 72 hours. Coag Panel:   No results found for: INR, PROTIME  RADIOLOGY: Ct Abdomen Pelvis W Contrast  10/12/2015  CLINICAL DATA:  Onset of lower abdominal / upper pelvic pain for the past 4 days. History of constipation and diarrhea. History of tonsillar cancer. EXAM: CT ABDOMEN AND PELVIS WITH CONTRAST TECHNIQUE: Multidetector CT imaging of the abdomen and pelvis was performed using the standard protocol following bolus administration of intravenous contrast. CONTRAST:  30mL OMNIPAQUE IOHEXOL 300 MG/ML  SOLN COMPARISON:  PET-CT - 12/28/2014 FINDINGS: Extensive diverticulosis involving the sigmoid colon with geographic area of wall thickening, adjacent mesenteric stranding and vascular congestion within the mid/distal aspect of the sigmoid colon (representative axial image 68, series 2), compatible with an area of acute uncomplicated diverticulitis. No evidence of perforation or definable/drainable fluid collection. No evidence of enteric obstruction. No pneumoperitoneum, pneumatosis or portal venous gas. The bowel is otherwise normal in course and caliber without discrete area of wall thickening. Normal appearance of the terminal ileum and appendix. Normal hepatic contour. Punctate (approximately 0.5 cm) hypoattenuating lesion within the right lobe of the liver (image  21, series 2) is too small to accurately characterize though favored to represent a hepatic cyst. Normal appearance of the gallbladder given degree distention. No radiopaque gallstones. No intra extrahepatic biliary duct dilatation. No ascites. There is symmetric enhancement and excretion of the bilateral kidneys. No definite renal stones in this postcontrast examination. No discrete renal lesions. No urine obstruction a  perinephric stranding. Normal appearance of the bilateral adrenal glands, pancreas and spleen. Scattered minimal amount of calcified atherosclerotic plaque within a normal caliber abdominal aorta. The major branch vessels of the abdominal aorta appear patent on this non CTA examination. Scattered retroperitoneal lymph nodes are individually not enlarged by size criteria. No retroperitoneal, mesenteric, pelvic or inguinal lymphadenopathy. Limited visualization of the lower thorax demonstrates a punctate (approximately 0.5 cm) nodule within the left lower lobe (image 7, series 3) grossly unchanged since PET-CT performed 12/2004 and thus of benign etiology. Minimal dependent subpleural ground-glass atelectasis. No focal airspace opacities. No pleural effusion. Normal heart size. Trace amount of pericardial fluid, presumably physiologic. No acute or aggressive osseous abnormalities. No acute or aggressive osseous abnormalities. Note is made of partial lumbarization of the S1 vertebral body. There is moderate multilevel lumbar spine DDD, worse at L5-S1 with disc space height loss, endplate irregularity and sclerosis. Regional soft tissues are normal. IMPRESSION: 1. Acute uncomplicated diverticulitis involving the mid/distal aspect of the sigmoid colon. No evidence of perforation or definable/drainable fluid collection. No evidence of enteric obstruction. If not recently performed, further evaluation with colonoscopy after the resolution of acute symptoms is recommended to exclude the presence of an underlying colonic malignancy. 2. Punctate (approximately 5 mm) left lower lobe pulmonary nodule is unchanged since the 12/2004 examination and thus of benign etiology. Electronically Signed   By: Sandi Mariscal M.D.   On: 10/12/2015 13:01   Dg Chest Portable 1 View  10/12/2015  CLINICAL DATA:  Abdominal pain, no bowel movement for 4 days, abnormal EKG, tachycardia, history tonsillar cancer post chemotherapy and radiation  therapy, smoker EXAM: PORTABLE CHEST 1 VIEW COMPARISON:  Portable exam 1145 hours compared to 10/26/2010 FINDINGS: Lordotic positioning. Normal heart size, mediastinal contours, and pulmonary vascularity. Prominent first costochondral junctions bilaterally. Lungs clear. No pleural effusion or pneumothorax. Bones unremarkable. IMPRESSION: No acute abnormalities. Electronically Signed   By: Lavonia Dana M.D.   On: 10/12/2015 12:11    ASSESSMENT/PLAN: 1. New onset Atrial flutter with RVR of unknown duration. Suspect AFL related to underlying illness but also at higher risk for atrial dysrhythmias due to heavy ETOH use. He did not respond to IV Cardizem so now on IV Amio gtt.  This patients CHA2DS2-VASc Score and unadjusted Ischemic Stroke Rate (% per year) is equal to 0.2 % stroke rate/year from a score of 1. Above score calculated as 1 point each if present [CHF, HTN, DM, Vascular=MI/PAD/Aortic Plaque, Age if 65-74, or Male]. Above score calculated as 2 points each if present [Age > 75, or Stroke/TIA/TE]. TEE yesterday showed probable LA thrombus.  No cardioversion done.  Remains rate controlled on BB and IV Amio.  Will stop IV Amio to avoid conversion to NSR.  Will increase coreg to 6.25mg  BID for rate control since stopping Amio.  If no ischemia on nuclear stress test tomorrow will change IV Heparin to Xarelto 20mg  daily.  Will need 3-4 weeks of anticoagulation and then outpt DCCV.    2. Dilated Cardiomyopathy most likely secondary to alcoholic cardiomyopathy and possible tachycardia induced CM but given multiple wall motion abnormalities, need to consider ischemic  in origin. EF 30-35% with mild LVH and severe HK of the basal and mid anteroseptal/anterior/anterolateral and apical anterior and lateral walls. RV moderately dilated and moderately reduced RVF. Will make NPO after MN for Lexiscan myoview in am to rule out ischemia.   3. Acute diverticulitis per IM 4. ETOH abuse ~ 5 beers per day -  counseled on need to quit and cardiac effects of alcohol abuse 5. Ongoing tobacco abuse 6. H/o tonsial CA   Sueanne Margarita, MD  10/16/2015  1:16 PM

## 2015-10-17 ENCOUNTER — Inpatient Hospital Stay (HOSPITAL_COMMUNITY): Payer: BLUE CROSS/BLUE SHIELD

## 2015-10-17 DIAGNOSIS — I429 Cardiomyopathy, unspecified: Secondary | ICD-10-CM

## 2015-10-17 LAB — NM MYOCAR MULTI W/SPECT W/WALL MOTION / EF
CHL CUP MPHR: 161 {beats}/min
CSEPHR: 77 %
CSEPPHR: 125 {beats}/min
Estimated workload: 1 METS
Exercise duration (min): 7 min
Exercise duration (sec): 22 s
Rest HR: 106 {beats}/min

## 2015-10-17 LAB — BASIC METABOLIC PANEL
ANION GAP: 9 (ref 5–15)
BUN: 8 mg/dL (ref 6–20)
CALCIUM: 8.4 mg/dL — AB (ref 8.9–10.3)
CO2: 25 mmol/L (ref 22–32)
CREATININE: 0.79 mg/dL (ref 0.61–1.24)
Chloride: 106 mmol/L (ref 101–111)
Glucose, Bld: 100 mg/dL — ABNORMAL HIGH (ref 65–99)
Potassium: 4 mmol/L (ref 3.5–5.1)
Sodium: 140 mmol/L (ref 135–145)

## 2015-10-17 LAB — CBC
HEMATOCRIT: 36.2 % — AB (ref 39.0–52.0)
Hemoglobin: 12 g/dL — ABNORMAL LOW (ref 13.0–17.0)
MCH: 33.5 pg (ref 26.0–34.0)
MCHC: 33.1 g/dL (ref 30.0–36.0)
MCV: 101.1 fL — ABNORMAL HIGH (ref 78.0–100.0)
PLATELETS: 171 10*3/uL (ref 150–400)
RBC: 3.58 MIL/uL — ABNORMAL LOW (ref 4.22–5.81)
RDW: 13.2 % (ref 11.5–15.5)
WBC: 4.7 10*3/uL (ref 4.0–10.5)

## 2015-10-17 LAB — HEPARIN LEVEL (UNFRACTIONATED)
HEPARIN UNFRACTIONATED: 0.7 [IU]/mL (ref 0.30–0.70)
Heparin Unfractionated: 0.56 IU/mL (ref 0.30–0.70)

## 2015-10-17 MED ORDER — REGADENOSON 0.4 MG/5ML IV SOLN
INTRAVENOUS | Status: AC
  Administered 2015-10-17: 0.4 mg via INTRAVENOUS
  Filled 2015-10-17: qty 5

## 2015-10-17 MED ORDER — SODIUM CHLORIDE 0.9% FLUSH: 3.0000 mL | INTRAVENOUS | Status: DC | PRN

## 2015-10-17 MED ORDER — SODIUM CHLORIDE 0.9 % IV SOLN: 250.0000 mL | INTRAVENOUS | Status: DC | PRN

## 2015-10-17 MED ORDER — TECHNETIUM TC 99M SESTAMIBI GENERIC - CARDIOLITE
30.0000 | Freq: Once | INTRAVENOUS | Status: AC | PRN
  Administered 2015-10-17: 30 via INTRAVENOUS

## 2015-10-17 MED ORDER — TECHNETIUM TC 99M SESTAMIBI GENERIC - CARDIOLITE
10.0000 | Freq: Once | INTRAVENOUS | Status: AC | PRN
  Administered 2015-10-17: 10 via INTRAVENOUS

## 2015-10-17 MED ORDER — HEPARIN (PORCINE) IN NACL 100-0.45 UNIT/ML-% IJ SOLN
1250.0000 [IU]/h | INTRAMUSCULAR | Status: DC
  Filled 2015-10-17: qty 250

## 2015-10-17 MED ORDER — REGADENOSON 0.4 MG/5ML IV SOLN
0.4000 mg | Freq: Once | INTRAVENOUS | Status: AC
  Administered 2015-10-17: 0.4 mg via INTRAVENOUS
  Filled 2015-10-17: qty 5

## 2015-10-17 MED ORDER — ASPIRIN 81 MG PO CHEW
81.0000 mg | CHEWABLE_TABLET | ORAL | Status: AC
  Administered 2015-10-18: 81 mg via ORAL
  Filled 2015-10-17: qty 1

## 2015-10-17 MED ORDER — LISINOPRIL 5 MG PO TABS
2.5000 mg | ORAL_TABLET | Freq: Every day | ORAL | Status: DC
  Administered 2015-10-17 – 2015-10-20 (×4): 2.5 mg via ORAL
  Filled 2015-10-17 (×4): qty 1

## 2015-10-17 MED ORDER — SODIUM CHLORIDE 0.9% FLUSH
3.0000 mL | Freq: Two times a day (BID) | INTRAVENOUS | Status: DC
  Administered 2015-10-18: 3 mL via INTRAVENOUS

## 2015-10-17 MED ORDER — SODIUM CHLORIDE 0.9 % IV SOLN: INTRAVENOUS | Status: DC

## 2015-10-17 NOTE — Progress Notes (Signed)
This note also relates to the following rows which could not be included: Pulse Rate - Cannot attach notes to unvalidated device data ECG Heart Rate - Cannot attach notes to unvalidated device data Resp - Cannot attach notes to unvalidated device data SpO2 - Cannot attach notes to unvalidated device data   5 mins, Rhonda PA at bedside, pt continues to deny SOB/CP.

## 2015-10-17 NOTE — Progress Notes (Signed)
[facility] PROGRESS NOTE  Adrian [name] [id number] DOB: 1956-07-11 DOA: 10/12/2015  PCP: [name], MD  Brief HPI: 60 year old Caucasian male with history of hypothyroidism who does drink significant amounts of alcohol in a daily basis, presented to the emergency department with complaints of abdominal pain and discomfort. He was seen previously at the urgent care center. Patient was found to be in atrial flutter with RVR. CT scan showed acute diverticulitis. He was hospitalized for further management.  Past medical history:  Past Medical History  Diagnosis Date  . Radiation NOv.3,2005-Dec. 15, 2005    6810 cGy in 30 fractions  . 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 History of chemotherapy 2005    Cisplatin  . Thyroid disease     Hypothyroid  . DCM (dilated cardiomyopathy) ([place]) 10/14/2015    Consultants: Cardiology  Procedures:  Echocardiogram Study Conclusions - Left ventricle: The cavity size was normal. There was mild concentric hypertrophy. Systolic function was moderately to severely reduced. The estimated ejection fraction was in the range of 30% to 35%. There is severe hypokinesis in the basal and mid anteroseptal, anterior, anterolateral and apical anterior and lateral walls. The study was not technically sufficient to allow evaluation of LV diastolic dysfunction due to atrial fibrillation. - Aortic valve: Trileaflet; normal thickness leaflets. There was no regurgitation. - Mitral valve: There was trivial regurgitation. - Right ventricle: The cavity size was normal. Wall thickness was normal. Systolic function was mildly to moderately reduced. - Right atrium: The atrium was normal in size. - Inferior vena cava: The vessel was normal in size. - Pericardium, extracardiac: There was no pericardial effusion.  Nuclear stress test Results pending  Antibiotics: Unasyn was changed to Augmentin on 3/10  Subjective: Patient continues to feel well. Had a regular bowel  movement yesterday. Denies any chest pain or shortness of breath. Abdominal pain has almost resolved. Denies any nausea or vomiting.   Objective: Vital Signs  Filed Vitals:   10/16/15 2342 10/17/15 0000 10/17/15 0339 10/17/15 0732  BP: 105/74 99/72 117/77 126/79  Pulse: 72 71 71 99  Temp: 98.1 F (36.7 C)  98.3 F (36.8 C) 98 F (36.7 C)  TempSrc: Oral  Oral Oral  Resp: 18 16 18 18   Height:      Weight:      SpO2: 95% 93% 95% 95%    Intake/Output Summary (Last 24 hours) at 10/17/15 0753 Last data filed at 10/17/15 0600  Gross per 24 hour  Intake 744.98 ml  Output    200 ml  Net 544.98 ml   Filed Weights   10/12/15 1029 10/15/15 1113  Weight: 65.772 kg (145 lb) 65.772 kg (145 lb)    General appearance: alert, cooperative, appears stated age and no distress Resp: clear to auscultation bilaterally Cardio: S1, S2 is tachycardic. Regular. No S3, S4. No rubs, murmurs, or bruit. No pedal edema. GI: Abdomen remains soft. Non-tender. No masses or organomegaly. Bowel sounds are present. Extremities: extremities normal, atraumatic, no cyanosis or edema Neurologic: Awake and alert. Oriented 3. No focal neurological deficits.  Lab Results:  Basic Metabolic Panel:  Recent Labs Lab 10/12/15 2041 10/13/15 0350 10/14/15 0450 10/15/15 0520 10/16/15 0459 10/17/15 0533  NA  --  139 140 142 142 140  K  --  3.8 3.6 3.8 3.7 4.0  CL  --  109 110 111 108 106  CO2  --  22 22 23 26 25   GLUCOSE  --  106* 118* 108* 100* 100*  BUN  --  8 7 8 9 8   CREATININE  --  0.84 0.84 0.89 0.94 0.79  CALCIUM  --  8.5* 8.3* 8.6* 8.6* 8.4*  MG  --  1.9  --   --   --   --   PHOS 3.1  --   --   --   --   --    Liver Function Tests:  Recent Labs Lab 10/12/15 1041  AST 20  ALT 21  ALKPHOS 66  BILITOT 0.9  PROT 6.9  ALBUMIN 3.4*    Recent Labs Lab 10/12/15 1041  LIPASE 21   CBC:  Recent Labs Lab 10/13/15 0350 10/14/15 0450 10/15/15 0520 10/16/15 0459 10/17/15 0533  WBC 7.6 5.1  5.1 4.9 4.7  HGB 13.8 13.0 12.2* 12.7* 12.0*  HCT 40.4 36.3* 36.5* 38.4* 36.2*  MCV 99.3 100.0 100.0 101.3* 101.1*  PLT 167 156 158 163 171   Cardiac Enzymes:  Recent Labs Lab 10/12/15 1041  TROPONINI 0.03   BNP (last 3 results)  Recent Labs  10/12/15 1151  BNP 115.8*     Recent Results (from the past 240 hour(s))  MRSA PCR Screening     Status: None   Collection Time: 10/13/15  2:37 AM  Result Value Ref Range Status   MRSA by PCR NEGATIVE NEGATIVE Final    Comment:        The GeneXpert MRSA Assay (FDA approved for NASAL specimens only), is one component of a comprehensive MRSA colonization surveillance program. It is not intended to diagnose MRSA infection nor to guide or monitor treatment for MRSA infections.       Studies/Results: No results found.  Medications:  Scheduled: . amoxicillin-clavulanate  1 tablet Oral Q12H  . carvedilol  6.25 mg Oral BID WC  . folic acid  1 mg Oral Daily  . levothyroxine  125 mcg Oral QAC breakfast  . multivitamin with minerals  1 tablet Oral Daily  . sodium chloride flush  3 mL Intravenous Q12H  . sodium chloride flush  3 mL Intravenous Q12H  . tamsulosin  0.4 mg Oral BID  . thiamine  100 mg Oral Daily   Or  . thiamine  100 mg Intravenous Daily   Continuous: . sodium chloride 10 mL/hr at 10/17/15 0400  . sodium chloride    . heparin 1,350 Units/hr (10/17/15 0731)   [id number], morphine injection, oxyCODONE, sodium chloride flush  Assessment/Plan:  Active Problems:   Atrial flutter with rapid ventricular response (HCC)   Diverticulitis   Diverticulitis of intestine without perforation or abscess without bleeding   Thyroid activity decreased   DCM (dilated cardiomyopathy) ([place])    New onset Atrial flutter with RVR Patient remains in atrial flutter. He was taken off of amiodarone yesterday. Heart rate remains between 90-110 and increases rapidly with minimal exertion. Patient is currently on carvedilol. He  is also on intravenous heparin. Cardiology is following and to consider further titration of carvedilol or additional agents to control heart rate. Echocardiogram report reviewed. TEE was done 3/10 revealing EF of 30-35% and "sludge" in LA appendage. So cardioversion was not attempted. This will be tried after 4 weeks of anticoagulation. Patient is noted to have reduced systolic function. Stress test to be done today. TSH is normal. Long-term anticoagulation and rate/rhythm control per cardiology.   Acute diverticulitis Patient has improved. Diverticulitis is responding to current treatment. Continue Augmentin. Patient reports having had a colonoscopy 2 years ago which apparently was unremarkable. He can follow-up  with his primary care provider to discuss this further.  Systolic CHF, likely chronic Appears to be well compensated. Management per cardiology. Has been started on Coreg. ACE inhibitor to be considered if the patient's blood pressure tolerates.   Left lower lobe pulmonary nodule. Appears to be stable compared to imaging studies from more than 10 years ago and hence thought to be benign. Outpatient follow-up.  Hypothyroidism. Continue with Synthroid. He will need close monitoring of his thyroid function tests if amiodarone is used long-term.   Alcohol abuse Patient was counseled. Continue CIWA protocol.  DVT Prophylaxis: Currently on IV heparin    Code Status: Full code  Family Communication: Discussed with the patient  Disposition Plan: Await improvement in heart rate. Anticipate discharge in 1-2 days.    LOS: 5 days   [facility] Pager [phone] 10/17/2015, 7:53 AM  If 7PM-7AM, please contact night-coverage at [email or web address], password [facility]

## 2015-10-17 NOTE — Progress Notes (Signed)
ANTICOAGULATION CONSULT NOTE - Follow Up Consult  Pharmacy Consult for Heparin Indication: atrial fibrillation  Not on File  Patient Measurements: Height: 5\' 8"  (172.7 cm) Weight: 145 lb (65.772 kg) IBW/kg (Calculated) : 68.4 Heparin Dosing Weight: 66kg  Vital Signs: Temp: 98 F (36.7 C) (03/12 0732) Temp Source: Oral (03/12 0732) BP: 126/79 mmHg (03/12 0732) Pulse Rate: 99 (03/12 0732)  Labs:  Recent Labs  10/15/15 0520 10/16/15 0459 10/17/15 0533  HGB 12.2* 12.7* 12.0*  HCT 36.5* 38.4* 36.2*  PLT 158 163 171  HEPARINUNFRC 0.58 0.62 0.70  CREATININE 0.89 0.94 0.79    Estimated Creatinine Clearance: 92.5 mL/min (by C-G formula based on Cr of 0.79).   Assessment: 60 yo M admitted 10/12/2015 with acute diverticulitis and found to be in AF. Planned TEE/DCCV on 3/10, however TEE showed sludging in LAA and cardioversion aborted.  Heparin level therapeutic at 0.7 on 1350 units/hr. However at high end of therapeutic range. Hgb stable, plt wnl. No noted bleeding. Will adjust slightly to maintain therapeutic range. Heparin to be transitioned to oral anticoagulation pending stress test results.  Goal of Therapy:  Heparin level 0.3-0.7 units/ml Monitor platelets by anticoagulation protocol: Yes   Plan:  Decrease to heparin 1250 units/hr 1430 HL Daily heparin level and CBC Monitor s/sx bleeding F/u stress test results and transition to rivaroxaban     Heloise Ochoa, Pharm.D., BCPS PGY2 Cardiology Pharmacy Resident Pager: [phone]  10/17/2015 8:05 AM

## 2015-10-17 NOTE — Progress Notes (Signed)
Patient Name: Adrian [name] Date of Encounter: 10/17/2015  Active Problems:   Atrial flutter with rapid ventricular response ([place])   Diverticulitis   Diverticulitis of intestine without perforation or abscess without bleeding   Thyroid activity decreased   DCM (dilated cardiomyopathy) [facility])   Primary Cardiologist: Dr [name]  Patient Profile: 60 y/o male with h/o tonsilar cancer (s/pe chemo and resection 2005 with brief requirements of a feeding tube), tobacco and ETOH use who is being admitted through the ER for acute diverticulitis. Seen by cards for rapid aflutter, s/p TEE w/ ?clot, no DCCV.   SUBJECTIVE: No chest pain, breathing OK, GI issues a little better.  OBJECTIVE Filed Vitals:   10/16/15 2342 10/17/15 0000 10/17/15 0339 10/17/15 0732  BP: 105/74 99/72 117/77 126/79  Pulse: 72 71 71 99  Temp: 98.1 F (36.7 C)  98.3 F (36.8 C) 98 F (36.7 C)  TempSrc: Oral  Oral Oral  Resp: 18 16 18 18   Height:      Weight:      SpO2: 95% 93% 95% 95%    Intake/Output Summary (Last 24 hours) at 10/17/15 0956 Last data filed at 10/17/15 0600  Gross per 24 hour  Intake  683.6 ml  Output    200 ml  Net  483.6 ml   Filed Weights   10/12/15 1029 10/15/15 1113  Weight: 145 lb (65.772 kg) 145 lb (65.772 kg)    PHYSICAL EXAM General: Well developed, well nourished, male in no acute distress. Head: Normocephalic, atraumatic.  Neck: Supple without bruits, JVD not elevated. Lungs:  Resp regular and unlabored, few rales bases. Heart: Rapid, slightly irregular R&R, S1, S2, no S3, S4, or murmur; no rub. Abdomen: Soft, non-tender, non-distended, BS + x 4.  Extremities: No clubbing, cyanosis, edema.  Neuro: Alert and oriented X 3. Moves all extremities spontaneously. Psych: Normal affect.  LABS: CBC: Recent Labs  10/16/15 0459 10/17/15 0533  WBC 4.9 4.7  HGB 12.7* 12.0*  HCT 38.4* 36.2*  MCV 101.3* 101.1*  PLT 163 [id number]   Basic Metabolic Panel: Recent Labs   10/16/15 0459 10/17/15 0533  NA 142 140  K 3.7 4.0  CL 108 106  CO2 26 25  GLUCOSE 100* 100*  BUN 9 8  CREATININE 0.94 0.79  CALCIUM 8.6* 8.4*   Lab Results  Component Value Date   ALT 21 10/12/2015   AST 20 10/12/2015   ALKPHOS 66 10/12/2015   BILITOT 0.9 10/12/2015   BNP:  B NATRIURETIC PEPTIDE  Date/Time Value Ref Range Status  10/12/2015 11:51 AM 115.8* 0.0 - 100.0 pg/mL Final   Lab Results  Component Value Date   TSH 2.180 10/12/2015    TELE:   Atrial flutter, rapid VR     Current Medications:  . amoxicillin-clavulanate  1 tablet Oral Q12H  . carvedilol  6.25 mg Oral BID WC  . folic acid  1 mg Oral Daily  . levothyroxine  125 mcg Oral QAC breakfast  . multivitamin with minerals  1 tablet Oral Daily  . regadenoson      . regadenoson  0.4 mg Intravenous Once  . sodium chloride flush  3 mL Intravenous Q12H  . sodium chloride flush  3 mL Intravenous Q12H  . tamsulosin  0.4 mg Oral BID  . thiamine  100 mg Oral Daily   Or  . thiamine  100 mg Intravenous Daily   . sodium chloride 10 mL/hr at 10/17/15 0400  . sodium chloride    .  heparin      ASSESSMENT AND PLAN: Active Problems:   Atrial flutter with rapid ventricular response (HCC) - continue Coreg, it was increased to 6.25 mg bid 03/12 - may need to increase further in am if still fast w/ minimal activity - MD advise on adding low-dose Verapamil (no real response to Cardizem)    Anticoagulation - CHADS2VASC=1 (HTN) - currently on heparin, can change to Xarelto if MV OK    DCM, EF 30-35% by echo, +WMA - For MV today to assess for ischemia - not on ASA due to anticoag need - on BB - BUN/Cr OK, will add low-dose lisinopril  Otherwise, per IM   Diverticulitis   Diverticulitis of intestine without perforation or abscess without bleeding   Thyroid activity decreased   DCM (dilated cardiomyopathy) ([place])   Signed, Rosaria Ferries , PA-C 9:56 AM 10/17/2015

## 2015-10-17 NOTE — Progress Notes (Signed)
Lexiscan MV performed, pt tol well, 1 day study, [place] to read.  Adrian [name], Hershal Coria 10/17/2015 10:15 AM Beeper [phone]

## 2015-10-17 NOTE — Progress Notes (Signed)
ANTICOAGULATION CONSULT NOTE - Follow Up Consult  Pharmacy Consult for Heparin Indication: atrial fibrillation  Not on File  Patient Measurements: Height: 5\' 8"  (172.7 cm) Weight: 145 lb (65.772 kg) IBW/kg (Calculated) : 68.4 Heparin Dosing Weight: 66kg  Vital Signs: Temp: 97.9 F (36.6 C) (03/12 1634) Temp Source: Oral (03/12 0732) BP: 132/91 mmHg (03/12 1137) Pulse Rate: 122 (03/12 1005)  Labs:  Recent Labs  10/15/15 0520 10/16/15 0459 10/17/15 0533 10/17/15 1509  HGB 12.2* 12.7* 12.0*  --   HCT 36.5* 38.4* 36.2*  --   PLT 158 163 171  --   HEPARINUNFRC 0.58 0.62 0.70 0.56  CREATININE 0.89 0.94 0.79  --     Estimated Creatinine Clearance: 92.5 mL/min (by C-G formula based on Cr of 0.79).   Assessment: 60 yo M admitted 10/12/2015 with acute diverticulitis and found to be in AF. Planned TEE/DCCV on 3/10, however TEE showed sludging in LAA and cardioversion aborted.  Heparin to be transitioned to oral anticoagulation pending stress test results.  Evening Heparin level therapeutic at 0.56 after rate decreased to 1250 units/hr.  No bleeding documented.   Goal of Therapy:  Heparin level 0.3-0.7 units/ml Monitor platelets by anticoagulation protocol: Yes   Plan:  - continue heparin at 1250 units/hr - Daily heparin level and CBC - Monitor s/sx bleeding - F/u stress test results and transition to oral [facility]   Dia Sitter, PharmD, BCPS 10/17/2015 4:43 PM

## 2015-10-17 NOTE — Progress Notes (Addendum)
This note also relates to the following rows which could not be included: Pulse Rate - Cannot attach notes to unvalidated device data ECG Heart Rate - Cannot attach notes to unvalidated device data Resp - Cannot attach notes to unvalidated device data SpO2 - Cannot attach notes to unvalidated device data   7 mins, Rhonda PA at bedside, pt continues to deny SOB/CP. Test ended.

## 2015-10-17 NOTE — Progress Notes (Signed)
This note also relates to the following rows which could not be included: Pulse Rate - Cannot attach notes to unvalidated device data ECG Heart Rate - Cannot attach notes to unvalidated device data Resp - Cannot attach notes to unvalidated device data SpO2 - Cannot attach notes to unvalidated device data   3 mins, Rhonda PA at bedside, pt reports SOB has subsided, continues to deny CP

## 2015-10-17 NOTE — Progress Notes (Signed)
This note also relates to the following rows which could not be included: Pulse Rate - Cannot attach notes to unvalidated device data ECG Heart Rate - Cannot attach notes to unvalidated device data Resp - Cannot attach notes to unvalidated device data SpO2 - Cannot attach notes to unvalidated device data   1 min, Rhonda PA at bedside, pt reports SOB, pt denies CP.

## 2015-10-18 ENCOUNTER — Encounter (HOSPITAL_COMMUNITY)
Admission: EM | Disposition: A | Discharge status: Home / Self Care | Payer: Self-pay | Source: Home / Self Care | Attending: Internal Medicine

## 2015-10-18 DIAGNOSIS — R931 Abnormal findings on diagnostic imaging of heart and coronary circulation: Secondary | ICD-10-CM

## 2015-10-18 DIAGNOSIS — I429 Cardiomyopathy, unspecified: Secondary | ICD-10-CM

## 2015-10-18 DIAGNOSIS — I428 Other cardiomyopathies: Secondary | ICD-10-CM | POA: Insufficient documentation

## 2015-10-18 HISTORY — PX: CARDIAC CATHETERIZATION: SHX172

## 2015-10-18 LAB — CBC
HCT: 37.8 % — ABNORMAL LOW (ref 39.0–52.0)
Hemoglobin: 12.7 g/dL — ABNORMAL LOW (ref 13.0–17.0)
MCH: 33.8 pg (ref 26.0–34.0)
MCHC: 33.6 g/dL (ref 30.0–36.0)
MCV: 100.5 fL — AB (ref 78.0–100.0)
PLATELETS: 218 10*3/uL (ref 150–400)
RBC: 3.76 MIL/uL — AB (ref 4.22–5.81)
RDW: 12.9 % (ref 11.5–15.5)
WBC: 4.7 10*3/uL (ref 4.0–10.5)

## 2015-10-18 LAB — PROTIME-INR
INR: 1.03 (ref 0.00–1.49)
PROTHROMBIN TIME: 13.7 s (ref 11.6–15.2)

## 2015-10-18 LAB — HEPARIN LEVEL (UNFRACTIONATED): Heparin Unfractionated: 0.47 IU/mL (ref 0.30–0.70)

## 2015-10-18 LAB — POCT ACTIVATED CLOTTING TIME: Activated Clotting Time: 126 seconds

## 2015-10-18 SURGERY — LEFT HEART CATH AND CORONARY ANGIOGRAPHY
Anesthesia: LOCAL

## 2015-10-18 MED ORDER — FENTANYL CITRATE (PF) 100 MCG/2ML IJ SOLN
INTRAMUSCULAR | Status: AC
  Filled 2015-10-18: qty 2

## 2015-10-18 MED ORDER — METOPROLOL TARTRATE 1 MG/ML IV SOLN
INTRAVENOUS | Status: AC
  Filled 2015-10-18: qty 5

## 2015-10-18 MED ORDER — CARVEDILOL 12.5 MG PO TABS
12.5000 mg | ORAL_TABLET | Freq: Two times a day (BID) | ORAL | Status: DC
  Administered 2015-10-18 – 2015-10-19 (×2): 12.5 mg via ORAL
  Filled 2015-10-18 (×2): qty 1

## 2015-10-18 MED ORDER — FENTANYL CITRATE (PF) 100 MCG/2ML IJ SOLN
INTRAMUSCULAR | Status: DC | PRN
  Administered 2015-10-18: 25 ug via INTRAVENOUS

## 2015-10-18 MED ORDER — HEPARIN (PORCINE) IN NACL 2-0.9 UNIT/ML-% IJ SOLN
INTRAMUSCULAR | Status: AC
  Filled 2015-10-18: qty 1500

## 2015-10-18 MED ORDER — METOPROLOL TARTRATE 1 MG/ML IV SOLN
INTRAVENOUS | Status: DC | PRN
  Administered 2015-10-18 (×2): 5 mg via INTRAVENOUS

## 2015-10-18 MED ORDER — SODIUM CHLORIDE 0.9% FLUSH: 3.0000 mL | INTRAVENOUS | Status: DC | PRN

## 2015-10-18 MED ORDER — LIDOCAINE HCL (PF) 1 % IJ SOLN
INTRAMUSCULAR | Status: AC
  Filled 2015-10-18: qty 30

## 2015-10-18 MED ORDER — SODIUM CHLORIDE 0.9% FLUSH: 3.0000 mL | Freq: Two times a day (BID) | INTRAVENOUS | Status: DC

## 2015-10-18 MED ORDER — IOHEXOL 350 MG/ML SOLN
INTRAVENOUS | Status: DC | PRN
  Administered 2015-10-18: 50 mL via INTRA_ARTERIAL

## 2015-10-18 MED ORDER — HYDRALAZINE HCL 20 MG/ML IJ SOLN
20.0000 mg | Freq: Once | INTRAMUSCULAR | Status: AC
  Administered 2015-10-18: 10 mg via INTRAVENOUS

## 2015-10-18 MED ORDER — NITROGLYCERIN 1 MG/10 ML FOR IR/CATH LAB
INTRA_ARTERIAL | Status: DC | PRN
  Administered 2015-10-18: 15:00:00

## 2015-10-18 MED ORDER — SODIUM CHLORIDE 0.9 % IV SOLN: 250.0000 mL | INTRAVENOUS | Status: DC | PRN

## 2015-10-18 MED ORDER — HEPARIN (PORCINE) IN NACL 100-0.45 UNIT/ML-% IJ SOLN
1250.0000 [IU]/h | INTRAMUSCULAR | Status: DC
  Administered 2015-10-18: 23:00:00 1250 [IU]/h via INTRAVENOUS
  Filled 2015-10-18: qty 250

## 2015-10-18 MED ORDER — MIDAZOLAM HCL 2 MG/2ML IJ SOLN
INTRAMUSCULAR | Status: AC
  Filled 2015-10-18: qty 2

## 2015-10-18 MED ORDER — VERAPAMIL HCL 2.5 MG/ML IV SOLN
INTRAVENOUS | Status: AC
  Filled 2015-10-18: qty 2

## 2015-10-18 MED ORDER — SODIUM CHLORIDE 0.9 % IV SOLN
INTRAVENOUS | Status: AC
Start: 2015-10-18 — End: 2015-10-18

## 2015-10-18 MED ORDER — MIDAZOLAM HCL 2 MG/2ML IJ SOLN
INTRAMUSCULAR | Status: DC | PRN
  Administered 2015-10-18: 1 mg via INTRAVENOUS

## 2015-10-18 MED ORDER — HYDRALAZINE HCL 20 MG/ML IJ SOLN
INTRAMUSCULAR | Status: AC
  Filled 2015-10-18: qty 1

## 2015-10-18 MED ORDER — ATORVASTATIN CALCIUM 80 MG PO TABS
80.0000 mg | ORAL_TABLET | Freq: Every day | ORAL | Status: DC
  Administered 2015-10-18 – 2015-10-19 (×2): 80 mg via ORAL
  Filled 2015-10-18 (×2): qty 1

## 2015-10-18 MED ORDER — LIDOCAINE HCL (PF) 1 % IJ SOLN
INTRAMUSCULAR | Status: DC | PRN
  Administered 2015-10-18: 18 mL

## 2015-10-18 SURGICAL SUPPLY — 9 items
CATH INFINITI 5FR MULTPACK ANG (CATHETERS) ×1 IMPLANT
GLIDESHEATH SLEND SS 6F .021 (SHEATH) IMPLANT
KIT HEART LEFT (KITS) ×2 IMPLANT
PACK CARDIAC CATHETERIZATION (CUSTOM PROCEDURE TRAY) ×2 IMPLANT
SHEATH PINNACLE 5F 10CM (SHEATH) ×1 IMPLANT
TRANSDUCER W/STOPCOCK (MISCELLANEOUS) ×2 IMPLANT
TUBING CIL FLEX 10 FLL-RA (TUBING) ×1 IMPLANT
WIRE EMERALD 3MM-J .035X150CM (WIRE) ×1 IMPLANT
WIRE SAFE-T 1.5MM-J .035X260CM (WIRE) ×1 IMPLANT

## 2015-10-18 NOTE — Telephone Encounter (Signed)
Paperwork completed and faxed on 10/18/15 to patients wife.

## 2015-10-18 NOTE — Progress Notes (Signed)
1630:  HR 110   BP  116/79   97%  16RR

## 2015-10-18 NOTE — Progress Notes (Signed)
1545: HR 114  140/101  15 RR   98%                        1600:   HR 109   122/84   RR 15   97%

## 2015-10-18 NOTE — Progress Notes (Signed)
Site area: rt groin Site Prior to Removal:  Level 0 Pressure Applied For: 20 minutes Manual:   yes Patient Status During Pull:  stable Post Pull Site:  Level  0 Post Pull Instructions Given:  yes Post Pull Pulses Present:  Yes   Dressing Applied:  tegaderm Bedrest begins @ [id number] Comments:

## 2015-10-18 NOTE — Progress Notes (Signed)
[facility] PROGRESS NOTE  Adrian [name] [id number] DOB: [birth year] DOA: 10/12/2015  PCP: [name], MD  Brief HPI: 60 year old Caucasian male with history of hypothyroidism who does drink significant amounts of alcohol in a daily basis, presented to the emergency department with complaints of abdominal pain and discomfort. He was seen previously at the urgent care center. Patient was found to be in atrial flutter with RVR. CT scan showed acute diverticulitis. He was hospitalized for further management. Patient was placed on an amiodarone infusion. He was seen by cardiology. He was started on IV heparin. He underwent a stress test which revealed reversible defect in the inferior apex area. Plan is for cardiac catheterization.  Past medical history:  Past Medical History  Diagnosis Date  . Radiation NOv.3,2005-Dec. 15, 2005    6810 cGy in 30 fractions  . Tonsil cancer ([place]) 2005  . History of chemotherapy 2005    Cisplatin  . Thyroid disease     Hypothyroid  . DCM (dilated cardiomyopathy) ([place]) 10/14/2015    Consultants: Cardiology  Procedures:  Echocardiogram Study Conclusions - Left ventricle: The cavity size was normal. There was mild concentric hypertrophy. Systolic function was moderately to severely reduced. The estimated ejection fraction was in the range of 30% to 35%. There is severe hypokinesis in the basal and mid anteroseptal, anterior, anterolateral and apical anterior and lateral walls. The study was not technically sufficient to allow evaluation of LV diastolic dysfunction due to atrial fibrillation. - Aortic valve: Trileaflet; normal thickness leaflets. There was no regurgitation. - Mitral valve: There was trivial regurgitation. - Right ventricle: The cavity size was normal. Wall thickness was normal. Systolic function was mildly to moderately reduced. - Right atrium: The atrium was normal in size. - Inferior vena cava: The vessel was normal in size. -  Pericardium, extracardiac: There was no pericardial effusion.  Nuclear stress test IMPRESSION: 1. Moderate sized, moderate severity reversible defect along the inferior apex. 2. Normal left ventricular wall motion. 3. Left ventricular ejection fraction 37% 4. Intermediate-risk stress test findings.  Antibiotics: Unasyn was changed to Augmentin on 3/10  Subjective: Patient denies any complaints. He is feeling a little apprehensive regarding his cardiac catheterization. No chest pain, shortness of breath. He is having regular bowel movements. Denies any nausea, vomiting or abdominal pain.   Objective: Vital Signs  Filed Vitals:   10/17/15 2340 10/18/15 0332 10/18/15 0335 10/18/15 0600  BP: 123/75  111/73   Pulse: 114  117   Temp:  97.9 F (36.6 C)    TempSrc:  Oral    Resp: 12  15   Height:      Weight:    70.761 kg (156 lb)  SpO2: 97%  94%     Intake/Output Summary (Last 24 hours) at 10/18/15 0731 Last data filed at 10/18/15 0600  Gross per 24 hour  Intake 660.66 ml  Output   1626 ml  Net -965.34 ml   Filed Weights   10/12/15 1029 10/15/15 1113 10/18/15 0600  Weight: 65.772 kg (145 lb) 65.772 kg (145 lb) 70.761 kg (156 lb)    General appearance: alert, cooperative, appears stated age and no distress Resp: clear to auscultation bilaterally Cardio: S1, S2 is tachycardic. Regular. No S3, S4. No rubs, murmurs, or bruit. No pedal edema. GI: Abdomen remains soft. Non-tender. No masses or organomegaly. Bowel sounds are present. Neurologic: Awake and alert. Oriented 3. No focal neurological deficits.  Lab Results:  Basic Metabolic Panel:  Recent Labs Lab 10/12/15  2041 10/13/15 0350 10/14/15 0450 10/15/15 0520 10/16/15 0459 10/17/15 0533  NA  --  139 140 142 142 140  K  --  3.8 3.6 3.8 3.7 4.0  CL  --  109 110 111 108 106  CO2  --  22 22 23 26 25   GLUCOSE  --  106* 118* 108* 100* 100*  BUN  --  8 7 8 9 8   CREATININE  --  0.84 0.84 0.89 0.94 0.79  CALCIUM  --   8.5* 8.3* 8.6* 8.6* 8.4*  MG  --  1.9  --   --   --   --   PHOS 3.1  --   --   --   --   --    Liver Function Tests:  Recent Labs Lab 10/12/15 1041  AST 20  ALT 21  ALKPHOS 66  BILITOT 0.9  PROT 6.9  ALBUMIN 3.4*    Recent Labs Lab 10/12/15 1041  LIPASE 21   CBC:  Recent Labs Lab 10/14/15 0450 10/15/15 0520 10/16/15 0459 10/17/15 0533 10/18/15 0049  WBC 5.1 5.1 4.9 4.7 4.7  HGB 13.0 12.2* 12.7* 12.0* 12.7*  HCT 36.3* 36.5* 38.4* 36.2* 37.8*  MCV 100.0 100.0 101.3* 101.1* 100.5*  PLT 156 158 163 171 218   Cardiac Enzymes:  Recent Labs Lab 10/12/15 1041  TROPONINI 0.03   BNP (last 3 results)  Recent Labs  10/12/15 1151  BNP 115.8*     Recent Results (from the past 240 hour(s))  MRSA PCR Screening     Status: None   Collection Time: 10/13/15  2:37 AM  Result Value Ref Range Status   MRSA by PCR NEGATIVE NEGATIVE Final    Comment:        The GeneXpert MRSA Assay (FDA approved for NASAL specimens only), is one component of a comprehensive MRSA colonization surveillance program. It is not intended to diagnose MRSA infection nor to guide or monitor treatment for MRSA infections.       Studies/Results: Nm Myocar Multi W/spect W/wall Motion / Ef  10/17/2015  CLINICAL DATA:  Atrial fibrillation, tachycardia, cardiomyopathy EXAM: MYOCARDIAL IMAGING WITH SPECT (REST AND PHARMACOLOGIC-STRESS) GATED LEFT VENTRICULAR WALL MOTION STUDY LEFT VENTRICULAR EJECTION FRACTION TECHNIQUE: Standard myocardial SPECT imaging was performed after resting intravenous injection of 10 mCi Tc-22m sestamibi. Subsequently, intravenous infusion of Lexiscan was performed under the supervision of the Cardiology staff. At peak effect of the drug, 30 mCi Tc-67m sestamibi was injected intravenously and standard myocardial SPECT imaging was performed. Quantitative gated imaging was also performed to evaluate left ventricular wall motion, and estimate left ventricular ejection  fraction. COMPARISON:  None. FINDINGS: Perfusion: Moderate sized, moderate severity reversible defect along the inferior apex. Wall Motion: Normal left ventricular wall motion. No left ventricular dilation. Left Ventricular Ejection Fraction: 37 % End diastolic volume 95 ml End systolic volume 59 ml IMPRESSION: 1. Moderate sized, moderate severity reversible defect along the inferior apex. 2. Normal left ventricular wall motion. 3. Left ventricular ejection fraction 37% 4. Intermediate-risk stress test findings*. *2012 Appropriate Use Criteria for Coronary Revascularization Focused Update: J Am Coll Cardiol. [id number]. http://content.airportbarriers.com.aspx?articleid=1201161 These results will be called to the ordering clinician or representative by the Radiologist Assistant, and communication documented in the PACS or zVision Dashboard. Electronically Signed   By: Julian Hy M.D.   On: 10/17/2015 12:19    Medications:  Scheduled: . amoxicillin-clavulanate  1 tablet Oral Q12H  . carvedilol  12.5 mg Oral BID WC  . folic  acid  1 mg Oral Daily  . levothyroxine  125 mcg Oral QAC breakfast  . lisinopril  2.5 mg Oral Daily  . multivitamin with minerals  1 tablet Oral Daily  . sodium chloride flush  3 mL Intravenous Q12H  . sodium chloride flush  3 mL Intravenous Q12H  . sodium chloride flush  3 mL Intravenous Q12H  . tamsulosin  0.4 mg Oral BID  . thiamine  100 mg Oral Daily   Or  . thiamine  100 mg Intravenous Daily   Continuous: . sodium chloride 10 mL/hr at 10/18/15 0600  . sodium chloride    . sodium chloride    . heparin 1,250 Units/hr (10/18/15 0600)   [id number] chloride, metoprolol, morphine injection, oxyCODONE, sodium chloride flush, sodium chloride flush  Assessment/Plan:  Active Problems:   Atrial flutter with rapid ventricular response (HCC)   Diverticulitis   Diverticulitis of intestine without perforation or abscess without bleeding   Thyroid activity  decreased   DCM (dilated cardiomyopathy) ([place])    New onset Atrial flutter with RVR Patient continues to remain in atrial flutter. Amiodarone was discontinued 3/11. Dose of carvedilol has been increased. Heart rate remains between 100 to 1:30. Patient is asymptomatic. He also remains on intravenous heparin. Cardiology is following closely. Cardioversion cannot be done due to presence of "sludge" in the left atrial appendage. Plan is for continuing anticoagulation for 4 weeks and reattempting cardioversion at that time. Echocardiogram shows reduced systolic function. TSH is normal. Long-term anticoagulation and rate/rhythm control per cardiology.   Systolic CHF, likely chronic Appears to be well compensated. Management per cardiology. Has been started on Coreg and ACE inhibitor. Patient underwent stress test yesterday which are showing reversible defect in the inferior apex. Plan is for cardiac catheterization today.  Acute diverticulitis Patient has improved. Diverticulitis is responding to current treatment. Continue Augmentin. Patient reports having had a colonoscopy 2 years ago which apparently was unremarkable. He can follow-up with his primary care provider to discuss this further.  Left lower lobe pulmonary nodule. Appears to be stable compared to imaging studies from more than 10 years ago and hence thought to be benign. Outpatient follow-up.  Hypothyroidism. Continue with Synthroid. He will need close monitoring of his thyroid function tests if amiodarone is used long-term.   Alcohol abuse Patient was counseled. He was placed on CIWA protocol. No signs or symptoms of withdrawal.  DVT Prophylaxis: Currently on IV heparin    Code Status: Full code  Family Communication: Discussed with the patient  Disposition Plan: Await improvement in heart rate.     LOS: 6 days   [facility] Pager [phone] 10/18/2015, 7:31 AM  If 7PM-7AM, please contact  night-coverage at [email or web address], password [facility]

## 2015-10-18 NOTE — Progress Notes (Signed)
ANTICOAGULATION CONSULT NOTE - Follow Up Consult  Pharmacy Consult for Heparin Indication: atrial fibrillation  Not on File  Patient Measurements: Height: 5\' 8"  (172.7 cm) Weight: 156 lb (70.761 kg) IBW/kg (Calculated) : 68.4 Heparin Dosing Weight: 66kg  Vital Signs: Temp: 97.6 F (36.4 C) (03/13 1206) Temp Source: Oral (03/13 1206) BP: 144/99 mmHg (03/13 1530) Pulse Rate: 114 (03/13 1530)  Labs:  Recent Labs  10/16/15 0459 10/17/15 0533 10/17/15 1509 10/18/15 0049  HGB 12.7* 12.0*  --  12.7*  HCT 38.4* 36.2*  --  37.8*  PLT 163 171  --  218  LABPROT  --   --   --  13.7  INR  --   --   --  1.03  HEPARINUNFRC 0.62 0.70 0.56 0.47  CREATININE 0.94 0.79  --   --     Estimated Creatinine Clearance: 96.2 mL/min (by C-G formula based on Cr of 0.79).   Assessment: 60 yo M admitted 10/12/2015 with acute diverticulitis and found to be in AF. Planned TEE/DCCV on 3/10, however TEE showed sludging in LAA and cardioversion aborted.  S/p LHC today. Pharmacy consulted to resume IV heparin 8 hours post sheath removal for Afib. Not candidate for longterm anticoagulation once back in NSR per cards note.   Goal of Therapy:  Heparin level 0.3-0.7 units/ml Monitor platelets by anticoagulation protocol: Yes   Plan:  Resume heparin gtt at 1250 units/hr to start at 2300 today. No bolus  Monitor daily HL, CBC, s/s of bleed Follow up transition to [place], PharmD., BCPS Clinical Pharmacist Pager [phone]

## 2015-10-18 NOTE — H&P (View-Only) (Signed)
Patient Name: Adrian [name] Date of Encounter: 10/17/2015  Active Problems:   Atrial flutter with rapid ventricular response ([place])   Diverticulitis   Diverticulitis of intestine without perforation or abscess without bleeding   Thyroid activity decreased   DCM (dilated cardiomyopathy) [facility])   Primary Cardiologist: Dr [name]  Patient Profile: 60 y/o male with h/o tonsilar cancer (s/pe chemo and resection 2005 with brief requirements of a feeding tube), tobacco and ETOH use who is being admitted through the ER for acute diverticulitis. Seen by cards for rapid aflutter, s/p TEE w/ ?clot, no DCCV.   SUBJECTIVE: No chest pain, breathing OK, GI issues a little better.  OBJECTIVE Filed Vitals:   10/16/15 2342 10/17/15 0000 10/17/15 0339 10/17/15 0732  BP: 105/74 99/72 117/77 126/79  Pulse: 72 71 71 99  Temp: 98.1 F (36.7 C)  98.3 F (36.8 C) 98 F (36.7 C)  TempSrc: Oral  Oral Oral  Resp: 18 16 18 18   Height:      Weight:      SpO2: 95% 93% 95% 95%    Intake/Output Summary (Last 24 hours) at 10/17/15 0956 Last data filed at 10/17/15 0600  Gross per 24 hour  Intake  683.6 ml  Output    200 ml  Net  483.6 ml   Filed Weights   10/12/15 1029 10/15/15 1113  Weight: 145 lb (65.772 kg) 145 lb (65.772 kg)    PHYSICAL EXAM General: Well developed, well nourished, male in no acute distress. Head: Normocephalic, atraumatic.  Neck: Supple without bruits, JVD not elevated. Lungs:  Resp regular and unlabored, few rales bases. Heart: Rapid, slightly irregular R&R, S1, S2, no S3, S4, or murmur; no rub. Abdomen: Soft, non-tender, non-distended, BS + x 4.  Extremities: No clubbing, cyanosis, edema.  Neuro: Alert and oriented X 3. Moves all extremities spontaneously. Psych: Normal affect.  LABS: CBC: Recent Labs  10/16/15 0459 10/17/15 0533  WBC 4.9 4.7  HGB 12.7* 12.0*  HCT 38.4* 36.2*  MCV 101.3* 101.1*  PLT 163 [id number]   Basic Metabolic Panel: Recent Labs   10/16/15 0459 10/17/15 0533  NA 142 140  K 3.7 4.0  CL 108 106  CO2 26 25  GLUCOSE 100* 100*  BUN 9 8  CREATININE 0.94 0.79  CALCIUM 8.6* 8.4*   Lab Results  Component Value Date   ALT 21 10/12/2015   AST 20 10/12/2015   ALKPHOS 66 10/12/2015   BILITOT 0.9 10/12/2015   BNP:  B NATRIURETIC PEPTIDE  Date/Time Value Ref Range Status  10/12/2015 11:51 AM 115.8* 0.0 - 100.0 pg/mL Final   Lab Results  Component Value Date   TSH 2.180 10/12/2015    TELE:   Atrial flutter, rapid VR     Current Medications:  . amoxicillin-clavulanate  1 tablet Oral Q12H  . carvedilol  6.25 mg Oral BID WC  . folic acid  1 mg Oral Daily  . levothyroxine  125 mcg Oral QAC breakfast  . multivitamin with minerals  1 tablet Oral Daily  . regadenoson      . regadenoson  0.4 mg Intravenous Once  . sodium chloride flush  3 mL Intravenous Q12H  . sodium chloride flush  3 mL Intravenous Q12H  . tamsulosin  0.4 mg Oral BID  . thiamine  100 mg Oral Daily   Or  . thiamine  100 mg Intravenous Daily   . sodium chloride 10 mL/hr at 10/17/15 0400  . sodium chloride    .  heparin      ASSESSMENT AND PLAN: Active Problems:   Atrial flutter with rapid ventricular response (HCC) - continue Coreg, it was increased to 6.25 mg bid 03/12 - may need to increase further in am if still fast w/ minimal activity - MD advise on adding low-dose Verapamil (no real response to Cardizem)    Anticoagulation - CHADS2VASC=1 (HTN) - currently on heparin, can change to Xarelto if MV OK    DCM, EF 30-35% by echo, +WMA - For MV today to assess for ischemia - not on ASA due to anticoag need - on BB - BUN/Cr OK, will add low-dose lisinopril  Otherwise, per IM   Diverticulitis   Diverticulitis of intestine without perforation or abscess without bleeding   Thyroid activity decreased   DCM (dilated cardiomyopathy) ([place])   Signed, Rosaria Ferries , PA-C 9:56 AM 10/17/2015

## 2015-10-18 NOTE — Interval H&P Note (Signed)
History and Physical Interval Note:  10/18/2015 2:16 PM  Adrian [name]  has presented today for cardiac cath with the diagnosis of cardiomyopathy/abnormal stress test. The various methods of treatment have been discussed with the patient and family. After consideration of risks, benefits and other options for treatment, the patient has consented to  Procedure(s): Left Heart Cath and Coronary Angiography (N/A) as a surgical intervention .  The patient's history has been reviewed, patient examined, no change in status, stable for surgery.  I have reviewed the patient's chart and labs.  Questions were answered to the patient's satisfaction.     MCALHANY,CHRISTOPHER

## 2015-10-18 NOTE — Progress Notes (Signed)
ANTICOAGULATION CONSULT NOTE - Follow Up Consult  Pharmacy Consult for Heparin Indication: atrial fibrillation  Not on File  Patient Measurements: Height: 5\' 8"  (172.7 cm) Weight: 156 lb (70.761 kg) IBW/kg (Calculated) : 68.4 Heparin Dosing Weight: 66kg  Vital Signs: Temp: 97.9 F (36.6 C) (03/13 0332) Temp Source: Oral (03/13 0332) BP: 111/73 mmHg (03/13 0335) Pulse Rate: 117 (03/13 0335)  Labs:  Recent Labs  10/16/15 0459 10/17/15 0533 10/17/15 1509 10/18/15 0049  HGB 12.7* 12.0*  --  12.7*  HCT 38.4* 36.2*  --  37.8*  PLT 163 171  --  218  LABPROT  --   --   --  13.7  INR  --   --   --  1.03  HEPARINUNFRC 0.62 0.70 0.56 0.47  CREATININE 0.94 0.79  --   --     Estimated Creatinine Clearance: 96.2 mL/min (by C-G formula based on Cr of 0.79).   Assessment: 60 yo M admitted 10/12/2015 with acute diverticulitis and found to be in AF. Planned TEE/DCCV on 3/10, however TEE showed sludging in LAA and cardioversion aborted.  On heparin gtt for Aflutter. Last HL remains therapeutic at 0.47. Hgb stable at 12.7, plts wnl. No s/s of bleed. Not candidate for longterm anticoagulation once back in NSR per cards note, but still in Afib. Will plan to start Xarelto pending stress test results.  Goal of Therapy:  Heparin level 0.3-0.7 units/ml Monitor platelets by anticoagulation protocol: Yes   Plan:  Continue heparin gtt at 1250 units/hr Monitor daily HL, CBC, s/s of bleed Follow up transition to [place], PharmD, [place] Pharmacist Pager [phone] 10/18/2015 7:46 AM

## 2015-10-19 ENCOUNTER — Inpatient Hospital Stay (HOSPITAL_COMMUNITY): Payer: BLUE CROSS/BLUE SHIELD

## 2015-10-19 ENCOUNTER — Encounter (HOSPITAL_COMMUNITY): Payer: Self-pay | Admitting: Cardiovascular Disease

## 2015-10-19 LAB — BASIC METABOLIC PANEL
ANION GAP: 11 (ref 5–15)
BUN: 7 mg/dL (ref 6–20)
CALCIUM: 8.9 mg/dL (ref 8.9–10.3)
CO2: 27 mmol/L (ref 22–32)
CREATININE: 0.83 mg/dL (ref 0.61–1.24)
Chloride: 104 mmol/L (ref 101–111)
GFR calc non Af Amer: >60 mL/min (ref >60)
Glucose, Bld: 93 mg/dL (ref 65–99)
Potassium: 3.9 mmol/L (ref 3.5–5.1)
SODIUM: 142 mmol/L (ref 135–145)

## 2015-10-19 LAB — CBC
HEMATOCRIT: 39.9 % (ref 39.0–52.0)
HEMOGLOBIN: 13.3 g/dL (ref 13.0–17.0)
MCH: 33.4 pg (ref 26.0–34.0)
MCHC: 33.3 g/dL (ref 30.0–36.0)
MCV: 100.3 fL — ABNORMAL HIGH (ref 78.0–100.0)
Platelets: 269 10*3/uL (ref 150–400)
RBC: 3.98 MIL/uL — ABNORMAL LOW (ref 4.22–5.81)
RDW: 12.9 % (ref 11.5–15.5)
WBC: 6.2 10*3/uL (ref 4.0–10.5)

## 2015-10-19 LAB — HEPARIN LEVEL (UNFRACTIONATED): HEPARIN UNFRACTIONATED: 0.25 [IU]/mL — AB (ref 0.30–0.70)

## 2015-10-19 MED ORDER — METOPROLOL SUCCINATE ER 50 MG PO TB24
50.0000 mg | ORAL_TABLET | Freq: Two times a day (BID) | ORAL | Status: DC
  Administered 2015-10-19 – 2015-10-20 (×3): 50 mg via ORAL
  Filled 2015-10-19 (×3): qty 1

## 2015-10-19 MED ORDER — APIXABAN 5 MG PO TABS: 5.0000 mg | ORAL_TABLET | Freq: Two times a day (BID) | ORAL | Status: DC

## 2015-10-19 MED ORDER — APIXABAN 5 MG PO TABS
10.0000 mg | ORAL_TABLET | Freq: Two times a day (BID) | ORAL | Status: DC
  Administered 2015-10-19 – 2015-10-20 (×3): 10 mg via ORAL
  Filled 2015-10-19 (×2): qty 2

## 2015-10-19 MED ORDER — OFF THE BEAT BOOK
Freq: Once | Status: DC
  Filled 2015-10-19: qty 1

## 2015-10-19 MED ORDER — APIXABAN 5 MG PO TABS
5.0000 mg | ORAL_TABLET | Freq: Two times a day (BID) | ORAL | Status: DC
  Filled 2015-10-19 (×2): qty 1

## 2015-10-19 MED FILL — Verapamil HCl IV Soln 2.5 MG/ML: INTRAVENOUS | Qty: 2 | Status: AC

## 2015-10-19 NOTE — Progress Notes (Signed)
ANTICOAGULATION CONSULT NOTE - Follow Up Consult  Pharmacy Consult for apixaban Indication: atrial fibrillation and LAA thrombus  Not on File  Patient Measurements: Height: 5\' 8"  (172.7 cm) Weight: 153 lb 10.6 oz (69.7 kg) IBW/kg (Calculated) : 68.4 Heparin Dosing Weight: 66kg  Vital Signs: Temp: 98 F (36.7 C) (03/14 0750) Temp Source: Oral (03/14 0750) BP: 127/88 mmHg (03/14 0750) Pulse Rate: 124 (03/14 0750)  Labs:  Recent Labs  10/17/15 0533 10/17/15 1509 10/18/15 0049 10/19/15 0403 10/19/15 0538  HGB 12.0*  --  12.7* 13.3  --   HCT 36.2*  --  37.8* 39.9  --   PLT 171  --  218 269  --   LABPROT  --   --  13.7  --   --   INR  --   --  1.03  --   --   HEPARINUNFRC 0.70 0.56 0.47  --  0.25*  CREATININE 0.79  --   --  0.83  --     Estimated Creatinine Clearance: 92.7 mL/min (by C-G formula based on Cr of 0.83).   Assessment: 57 yom s/p cardiac cath with aflutter with LAA thrombus initially started on heparin but now transitioning to apixaban. CBC is WNL and pt has good renal function.   Goal of Therapy:  Heparin level 0.3-0.7 units/ml Monitor platelets by anticoagulation protocol: Yes   Plan:  - Apixaban 10mg  PO BID x 7 days then 5mg  PO BID - F/u renal fxn, S&S of bleeding - Will plan to educate pt prior to discharge  * Pharmacy will sign-off and only follow peripherally as no dose adjustments are anticipated. Thank you for the consult!  Salome Arnt, PharmD, BCPS Pager # [phone] 10/19/2015 10:41 AM

## 2015-10-19 NOTE — Discharge Instructions (Addendum)
Information on my medicine - ELIQUIS (apixaban)  This medication education was reviewed with me or my healthcare representative as part of my discharge preparation.  The pharmacist that spoke with me during my hospital stay was:  Rumbarger, Rande Lawman, [facility]  Why was Eliquis prescribed for you? Eliquis was prescribed to treat atrial fibrillation and blood clots that have been found in your heart and to reduce the risk of them occurring again.  What do You need to know about Eliquis ? The starting dose is 10 mg (two 5 mg tablets) taken TWICE daily for the FIRST SEVEN (7) DAYS, then on (enter date)  10/26/15  the dose is reduced to ONE 5 mg tablet taken TWICE daily.  Eliquis may be taken with or without food.   Try to take the dose about the same time in the morning and in the evening. If you have difficulty swallowing the tablet whole please discuss with your pharmacist how to take the medication safely.  Take Eliquis exactly as prescribed and DO NOT stop taking Eliquis without talking to the doctor who prescribed the medication.  Stopping may increase your risk of developing a new blood clot.  Refill your prescription before you run out.  After discharge, you should have regular check-up appointments with your healthcare provider that is prescribing your Eliquis.    What do you do if you miss a dose? If a dose of ELIQUIS is not taken at the scheduled time, take it as soon as possible on the same day and twice-daily administration should be resumed. The dose should not be doubled to make up for a missed dose.  Important Safety Information A possible side effect of Eliquis is bleeding. You should call your healthcare provider right away if you experience any of the following: ? Bleeding from an injury or your nose that does not stop. ? Unusual colored urine (red or dark brown) or unusual colored stools (red or black). ? Unusual bruising for unknown reasons. ? A serious fall or if you  hit your head (even if there is no bleeding).  Some medicines may interact with Eliquis and might increase your risk of bleeding or clotting while on Eliquis. To help avoid this, consult your healthcare provider or pharmacist prior to using any new prescription or non-prescription medications, including herbals, vitamins, non-steroidal anti-inflammatory drugs (NSAIDs) and supplements.  This website has more information on Eliquis (apixaban): http://www.eliquis.com/eliquis/home

## 2015-10-19 NOTE — Progress Notes (Signed)
[facility] PROGRESS NOTE  Adrian [name] [id number] DOB: Jun 22, 1956 DOA: 10/12/2015  PCP: [name], MD  Brief HPI: 60 year old Caucasian male with history of hypothyroidism who does drink significant amounts of alcohol in a daily basis, presented to the emergency department with complaints of abdominal pain and discomfort. He was seen previously at the urgent care center. Patient was found to be in atrial flutter with RVR. CT scan showed acute diverticulitis. He was hospitalized for further management. Patient was placed on an amiodarone infusion. He was seen by cardiology. He was started on IV heparin. He underwent a stress test which revealed reversible defect in the inferior apex area. Cardiac catheterization did not show any significant obstructive disease.  Past medical history:  Past Medical History  Diagnosis Date  . Radiation NOv.3,2005-Dec. 15, 2005    6810 cGy in 30 fractions  . 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 History of chemotherapy 2005    Cisplatin  . Thyroid disease     Hypothyroid  . DCM (dilated cardiomyopathy) ([place]) 10/14/2015    Consultants: Cardiology  Procedures:  Echocardiogram Study Conclusions - Left ventricle: The cavity size was normal. There was mild concentric hypertrophy. Systolic function was moderately to severely reduced. The estimated ejection fraction was in the range of 30% to 35%. There is severe hypokinesis in the basal and mid anteroseptal, anterior, anterolateral and apical anterior and lateral walls. The study was not technically sufficient to allow evaluation of LV diastolic dysfunction due to atrial fibrillation. - Aortic valve: Trileaflet; normal thickness leaflets. There was no regurgitation. - Mitral valve: There was trivial regurgitation. - Right ventricle: The cavity size was normal. Wall thickness was normal. Systolic function was mildly to moderately reduced. - Right atrium: The atrium was normal in size. - Inferior vena  cava: The vessel was normal in size. - Pericardium, extracardiac: There was no pericardial effusion.  Nuclear stress test IMPRESSION: 1. Moderate sized, moderate severity reversible defect along the inferior apex. 2. Normal left ventricular wall motion. 3. Left ventricular ejection fraction 37% 4. Intermediate-risk stress test findings.  Cardiac catheterization on March 13 Mild nonobstructive CAD noted.   Antibiotics: Unasyn was changed to Augmentin on 3/10  Subjective: Patient denies any complaints. He is frustrated by lack of improvement in his heart rate. Denies any shortness of breath or chest pain. Having regular bowel movements. Denies any abdominal pain.   Objective: Vital Signs  Filed Vitals:   10/18/15 2016 10/19/15 0357 10/19/15 0750 10/19/15 1113  BP: 115/86 127/86 127/88 113/84  Pulse: 112 123 124 120  Temp: 98 F (36.7 C) 98.8 F (37.1 C) 98 F (36.7 C) 98.3 F (36.8 C)  TempSrc: Oral Oral Oral Oral  Resp: 17 24 19 17   Height:      Weight:  69.7 kg (153 lb 10.6 oz)    SpO2: 100% 93% 98% 95%    Intake/Output Summary (Last 24 hours) at 10/19/15 1307 Last data filed at 10/19/15 0949  Gross per 24 hour  Intake 741.25 ml  Output   1750 ml  Net -1008.75 ml   Filed Weights   10/15/15 1113 10/18/15 0600 10/19/15 0357  Weight: 65.772 kg (145 lb) 70.761 kg (156 lb) 69.7 kg (153 lb 10.6 oz)    General appearance: alert, cooperative, appears stated age and no distress Resp: clear to auscultation bilaterally Cardio: S1, S2 remains tachycardic. Regular. No S3, S4. No rubs, murmurs, or bruit. No pedal edema. GI: Abdomen remains soft. Non-tender. No masses or organomegaly.  Bowel sounds are present. Neurologic: Awake and alert. Oriented 3. No focal neurological deficits.  Lab Results:  Basic Metabolic Panel:  Recent Labs Lab 10/12/15 2041  10/13/15 0350 10/14/15 0450 10/15/15 0520 10/16/15 0459 10/17/15 0533 10/19/15 0403  NA  --   < > 139 140 142  142 140 142  K  --   < > 3.8 3.6 3.8 3.7 4.0 3.9  CL  --   < > 109 110 111 108 106 104  CO2  --   < > 22 22 23 26 25 27   GLUCOSE  --   < > 106* 118* 108* 100* 100* 93  BUN  --   < > 8 7 8 9 8 7   CREATININE  --   < > 0.84 0.84 0.89 0.94 0.79 0.83  CALCIUM  --   < > 8.5* 8.3* 8.6* 8.6* 8.4* 8.9  MG  --   --  1.9  --   --   --   --   --   PHOS 3.1  --   --   --   --   --   --   --   < > = values in this interval not displayed. CBC:  Recent Labs Lab 10/15/15 0520 10/16/15 0459 10/17/15 0533 10/18/15 0049 10/19/15 0403  WBC 5.1 4.9 4.7 4.7 6.2  HGB 12.2* 12.7* 12.0* 12.7* 13.3  HCT 36.5* 38.4* 36.2* 37.8* 39.9  MCV 100.0 101.3* 101.1* 100.5* 100.3*  PLT 158 163 171 218 269   BNP (last 3 results)  Recent Labs  10/12/15 1151  BNP 115.8*     Recent Results (from the past 240 hour(s))  MRSA PCR Screening     Status: None   Collection Time: 10/13/15  2:37 AM  Result Value Ref Range Status   MRSA by PCR NEGATIVE NEGATIVE Final    Comment:        The GeneXpert MRSA Assay (FDA approved for NASAL specimens only), is one component of a comprehensive MRSA colonization surveillance program. It is not intended to diagnose MRSA infection nor to guide or monitor treatment for MRSA infections.       Studies/Results: Dg Chest 2 View  10/19/2015  CLINICAL DATA:  Shortness of breath. Tachycardia. Atrial fibrillation. EXAM: CHEST  2 VIEW COMPARISON:  10/12/2015 FINDINGS: Heart size is within normal limits. New linear opacity in right perihilar region is consistent with subsegmental atelectasis. Tiny bilateral pleural effusions noted. No evidence of pulmonary consolidation or edema. IMPRESSION: New mild right perihilar subsegmental atelectasis and tiny bilateral pleural effusions. Electronically Signed   By: Earle Gell M.D.   On: 10/19/2015 11:43    Medications:  Scheduled: . amoxicillin-clavulanate  1 tablet Oral Q12H  . apixaban  10 mg Oral BID   Followed by  . [START ON  10/26/2015] apixaban  5 mg Oral BID  . atorvastatin  80 mg Oral q1800  . folic acid  1 mg Oral Daily  . levothyroxine  125 mcg Oral QAC breakfast  . lisinopril  2.5 mg Oral Daily  . metoprolol succinate  50 mg Oral BID  . multivitamin with minerals  1 tablet Oral Daily  . off the beat book   Does not apply Once  . sodium chloride flush  3 mL Intravenous Q12H  . sodium chloride flush  3 mL Intravenous Q12H  . sodium chloride flush  3 mL Intravenous Q12H  . tamsulosin  0.4 mg Oral BID  . thiamine  100  mg Oral Daily   Continuous: . sodium chloride 10 mL/hr at 10/18/15 0600  . sodium chloride     [id number] chloride, metoprolol, morphine injection, oxyCODONE, sodium chloride flush, sodium chloride flush  Assessment/Plan:  Active Problems:   Atrial flutter with rapid ventricular response (HCC)   Diverticulitis   Diverticulitis of intestine without perforation or abscess without bleeding   Thyroid activity decreased   DCM (dilated cardiomyopathy) ([place])   Cardiomyopathy ([place])    New onset Atrial flutter with RVR Patient continues to remain in atrial flutter. Heart rate is poorly controlled. Amiodarone was discontinued 3/11. Dose of carvedilol was increased. Heart rate remains between 120 to 130. Patient is asymptomatic. He also remains on intravenous heparin. Cardiology is following and managing. Cardioversion could not be done due to presence of "sludge" in the left atrial appendage. Plan is for continuing anticoagulation for 4 weeks and reattempting cardioversion at that time. Echocardiogram shows reduced systolic function. TSH is normal. Long-term anticoagulation and rate/rhythm control per cardiology.   Systolic CHF, likely chronic Appears to be well compensated. Management per cardiology. Has been started on Coreg and ACE inhibitor. Patient underwent stress test which showed reversible defect in the inferior apex. Patient underwent cardiac catheterization which did not show any  significant CAD. This is nonischemic cardiomyopathy. Further management per cardiology.  Acute diverticulitis Patient has improved. Diverticulitis is responding to current treatment. Continue Augmentin. Patient reports having had a colonoscopy 2 years ago which apparently was unremarkable. He can follow-up with his primary care provider to discuss this further.  Left lower lobe pulmonary nodule. Appears to be stable compared to imaging studies from more than 10 years ago and hence thought to be benign. Outpatient follow-up.  Hypothyroidism. Continue with Synthroid. TSH was normal.  Alcohol abuse Patient was counseled. He was placed on CIWA protocol. No signs or symptoms of withdrawal.  DVT Prophylaxis: Currently on IV heparin    Code Status: Full code  Family Communication: Discussed with the patient  Disposition Plan: Await improvement in heart rate.     LOS: 7 days   [place] [facility] Pager [phone] 10/19/2015, 1:07 PM  If 7PM-7AM, please contact night-coverage at [email or web address], password [facility]

## 2015-10-19 NOTE — Progress Notes (Signed)
ANTICOAGULATION CONSULT NOTE - Follow Up Consult  Pharmacy Consult for heparin Indication: atrial fibrillation  Labs:  Recent Labs  10/17/15 0533 10/17/15 1509 10/18/15 0049 10/19/15 0403 10/19/15 0538  HGB 12.0*  --  12.7* 13.3  --   HCT 36.2*  --  37.8* 39.9  --   PLT 171  --  218 269  --   LABPROT  --   --  13.7  --   --   INR  --   --  1.03  --   --   HEPARINUNFRC 0.70 0.56 0.47  --  0.25*  CREATININE 0.79  --   --  0.83  --      Assessment/Plan: 59yo male subtherapeutic on heparin after resumed post-cath though had previously been therapeutic at this rate and may need more time to accumulate.  Will continue gtt at current rate and check additional level vs transition back to home PO anticoag.   Wynona Neat, PharmD, BCPS  10/19/2015,6:47 AM

## 2015-10-19 NOTE — Care Management Note (Signed)
Case Management Note  Patient Details  Name: MOUSTAPHA PALKO MRN: [id number] Date of Birth: [birth year]  Subjective/Objective:   Patient is indep, NCM gave patient 30 day savings card and $10 co pay savings card.  Patient will go to [facility] on [address] to get 30 day free trial.                   Action/Plan:   Expected Discharge Date:                  Expected Discharge Plan:  Home/Self Care  In-House Referral:     Discharge planning Services  CM Consult  Post Acute Care Choice:    Choice offered to:     DME Arranged:    DME Agency:     HH Arranged:    [place] Agency:     Status of Service:  Completed, signed off  Medicare Important Message Given:    Date Medicare IM Given:    Medicare IM give by:    Date Additional Medicare IM Given:    Additional Medicare Important Message give by:     If discussed at [place] of Stay Meetings, dates discussed:    Additional Comments:  Zenon Mayo, RN 10/19/2015, 3:48 PM

## 2015-10-19 NOTE — Progress Notes (Addendum)
Patient Name: Adrian [name] Date of Encounter: 10/19/2015  Active Problems:   Atrial flutter with rapid ventricular response ([place])   Diverticulitis   Diverticulitis of intestine without perforation or abscess without bleeding   Thyroid activity decreased   DCM (dilated cardiomyopathy) ([place])   Cardiomyopathy [facility])  Primary Cardiologist: Dr [name]  Patient Profile: 60 y/o male with h/o tonsilar cancer (s/pe chemo and resection 2005 with brief requirements of a feeding tube), tobacco and ETOH admitted 03/07 for acute diverticulitis. Seen by cards for rapid aflutter, s/p TEE w/ ?clot, no DCCV. Amio d/c'd so pt would not convert. DCM by echo>>MV abnl>>cath w/ minimal dz, NICM.  SUBJECTIVE: No chest pain, no SOB, not really aware of the atrial flutter. Wants to know if he can exercise  OBJECTIVE Filed Vitals:   10/18/15 1900 10/18/15 2016 10/19/15 0357 10/19/15 0750  BP: 126/97 115/86 127/86 127/88  Pulse: 120 112 123 124  Temp:  98 F (36.7 C) 98.8 F (37.1 C) 98 F (36.7 C)  TempSrc:  Oral Oral Oral  Resp: 18 17 24 19   Height:      Weight:   153 lb 10.6 oz (69.7 kg)   SpO2: 96% 100% 93% 98%    Intake/Output Summary (Last 24 hours) at 10/19/15 0754 Last data filed at 10/19/15 0401  Gross per 24 hour  Intake 541.88 ml  Output   3050 ml  Net -2508.12 ml   Filed Weights   10/15/15 1113 10/18/15 0600 10/19/15 0357  Weight: 145 lb (65.772 kg) 156 lb (70.761 kg) 153 lb 10.6 oz (69.7 kg)    PHYSICAL EXAM General: Well developed, well nourished, male in no acute distress. Head: Normocephalic, atraumatic.  Neck: Supple without bruits, JVD not elevated. Lungs:  Resp regular and unlabored, decreased BS R base. Heart: RRR, S1, S2, no S3, S4, or murmur; no rub. Abdomen: Soft, non-tender, non-distended, BS + x 4.  Extremities: No clubbing, cyanosis, edema.  Neuro: Alert and oriented X 3. Moves all extremities spontaneously. Psych: Normal affect.  LABS: CBC: Recent Labs  10/18/15 0049 10/19/15 0403  WBC 4.7 6.2  HGB 12.7* 13.3  HCT 37.8* 39.9  MCV 100.5* 100.3*  PLT 218 269   INR: Recent Labs  10/18/15 0049  INR [id number]   Basic Metabolic Panel: Recent Labs  10/17/15 0533 10/19/15 0403  NA 140 142  K 4.0 3.9  CL 106 104  CO2 25 27  GLUCOSE 100* 93  BUN 8 7  CREATININE 0.79 0.83  CALCIUM 8.4* 8.9   BNP:  B NATRIURETIC PEPTIDE  Date/Time Value Ref Range Status  10/12/2015 11:51 AM 115.8* 0.0 - 100.0 pg/mL Final   TELE: Atrial flutter, generally RVR    Radiology/Studies: Nm Myocar Multi W/spect W/wall Motion / Ef 10/17/2015  CLINICAL DATA:  Atrial fibrillation, tachycardia, cardiomyopathy EXAM: MYOCARDIAL IMAGING WITH SPECT (REST AND PHARMACOLOGIC-STRESS) GATED LEFT VENTRICULAR WALL MOTION STUDY LEFT VENTRICULAR EJECTION FRACTION TECHNIQUE: Standard myocardial SPECT imaging was performed after resting intravenous injection of 10 mCi Tc-110m sestamibi. Subsequently, intravenous infusion of Lexiscan was performed under the supervision of the Cardiology staff. At peak effect of the drug, 30 mCi Tc-60m sestamibi was injected intravenously and standard myocardial SPECT imaging was performed. Quantitative gated imaging was also performed to evaluate left ventricular wall motion, and estimate left ventricular ejection fraction. COMPARISON:  None. FINDINGS: Perfusion: Moderate sized, moderate severity reversible defect along the inferior apex. Wall Motion: Normal left ventricular wall motion. No left ventricular dilation. Left  Ventricular Ejection Fraction: 37 % End diastolic volume 95 ml End systolic volume 59 ml IMPRESSION: 1. Moderate sized, moderate severity reversible defect along the inferior apex. 2. Normal left ventricular wall motion. 3. Left ventricular ejection fraction 37% 4. Intermediate-risk stress test findings*. *2012 Appropriate Use Criteria for Coronary Revascularization Focused Update: J Am Coll Cardiol. [id number].  http://content.airportbarriers.com.aspx?articleid=1201161 These results will be called to the ordering clinician or representative by the Radiologist Assistant, and communication documented in the PACS or zVision Dashboard. Electronically Signed   By: Julian Hy M.D.   On: 10/17/2015 12:19     Current Medications:  . amoxicillin-clavulanate  1 tablet Oral Q12H  . atorvastatin  80 mg Oral q1800  . carvedilol  12.5 mg Oral BID WC  . folic acid  1 mg Oral Daily  . levothyroxine  125 mcg Oral QAC breakfast  . lisinopril  2.5 mg Oral Daily  . multivitamin with minerals  1 tablet Oral Daily  . off the beat book   Does not apply Once  . sodium chloride flush  3 mL Intravenous Q12H  . sodium chloride flush  3 mL Intravenous Q12H  . sodium chloride flush  3 mL Intravenous Q12H  . tamsulosin  0.4 mg Oral BID  . thiamine  100 mg Oral Daily   Or  . thiamine  100 mg Intravenous Daily   . sodium chloride 10 mL/hr at 10/18/15 0600  . sodium chloride    . heparin 1,250 Units/hr (10/18/15 2300)    ASSESSMENT AND PLAN: Active Problems:   Atrial flutter with rapid ventricular response (HCC) - possible LAA thrombus so no DCCV, amio d/c'd to prevent cardioversion - rate control with Coreg 12.5 mg bid is poor.  - MD advise on changing to Toprol XL 50 mg bid    Anticoagulation - currently on heparin for procedure - would be a candidate for NOAC.  - MD advise on Xarelto vs Eliquis - CHADS2VASC=1    Decreased BS R base - recheck CXR  Otherwise, per IM, pt OK for d/c from a cardiac standpoint once above issues finalized.   Diverticulitis   Diverticulitis of intestine without perforation or abscess without bleeding   Thyroid activity decreased   DCM (dilated cardiomyopathy) ([place])   Cardiomyopathy (HCC)   Signed, Barrett, Suanne Marker , PA-C 7:54 AM 10/19/2015  The patient was seen, examined and discussed with Rosaria Ferries, PA-C and I agree with the above.   60 year old male with  non-ischemic CMP, etoh abuse, mild non-obstructive CAD on a cath yesterday, a-flutter with RVR --> LAA thrombus, we will start on Eliquis and aim for rate control. Switch carvedilol to metoprolol 50 mg po BID.  Anticipated discharge tomorrow if HR is controlled.   Dorothy Spark 10/19/2015

## 2015-10-20 ENCOUNTER — Telehealth: Payer: Self-pay | Admitting: Physician Assistant

## 2015-10-20 DIAGNOSIS — E039 Hypothyroidism, unspecified: Secondary | ICD-10-CM

## 2015-10-20 DIAGNOSIS — K5792 Diverticulitis of intestine, part unspecified, without perforation or abscess without bleeding: Secondary | ICD-10-CM

## 2015-10-20 DIAGNOSIS — R0602 Shortness of breath: Secondary | ICD-10-CM

## 2015-10-20 LAB — CBC
HEMATOCRIT: 38.8 % — AB (ref 39.0–52.0)
HEMOGLOBIN: 13 g/dL (ref 13.0–17.0)
MCH: 33.6 pg (ref 26.0–34.0)
MCHC: 33.5 g/dL (ref 30.0–36.0)
MCV: 100.3 fL — AB (ref 78.0–100.0)
PLATELETS: 277 10*3/uL (ref 150–400)
RBC: 3.87 MIL/uL — AB (ref 4.22–5.81)
RDW: 13.2 % (ref 11.5–15.5)
WBC: 5.9 10*3/uL (ref 4.0–10.5)

## 2015-10-20 MED ORDER — DIGOXIN 125 MCG PO TABS: 0.1250 mg | ORAL_TABLET | Freq: Every day | ORAL | Status: DC

## 2015-10-20 MED ORDER — APIXABAN 5 MG PO TABS: ORAL_TABLET | ORAL | Status: DC

## 2015-10-20 MED ORDER — METOPROLOL SUCCINATE ER 50 MG PO TB24: 75.0000 mg | ORAL_TABLET | Freq: Two times a day (BID) | ORAL | Status: DC

## 2015-10-20 MED ORDER — LISINOPRIL 2.5 MG PO TABS: 2.5000 mg | ORAL_TABLET | Freq: Every day | ORAL | Status: DC

## 2015-10-20 MED ORDER — DIGOXIN 125 MCG PO TABS
0.1250 mg | ORAL_TABLET | Freq: Every day | ORAL | Status: DC
  Administered 2015-10-20: 11:00:00 0.125 mg via ORAL
  Filled 2015-10-20: qty 1

## 2015-10-20 MED ORDER — APIXABAN 5 MG PO TABS: 5.0000 mg | ORAL_TABLET | Freq: Two times a day (BID) | ORAL | Status: DC

## 2015-10-20 MED ORDER — METOPROLOL SUCCINATE ER 25 MG PO TB24: 75.0000 mg | ORAL_TABLET | Freq: Two times a day (BID) | ORAL | Status: DC

## 2015-10-20 NOTE — Telephone Encounter (Signed)
TCM phone call .Marland Kitchen Appt is on 11/01/15 at 12:10pm w/ Richardson Dopp at the [facility]

## 2015-10-20 NOTE — Care Management Note (Signed)
Case Management Note  Patient Details  Name: Adrian [name] MRN: [id number] Date of Birth: 01-09-1956  Subjective/Objective:      Atrial flutter with rapid ventricular response              Action/Plan: NCM spoke to pt. Has 30 day free trial and copay card for Eliquis. States his ex-wife will be at home to assist. He can afford his medication as at home. Elilquis-pt's copay will be $5. Pt can pick up at [facility] on [facility] [phone]. They have 5 mg in stock- prior auth not required.  PCP Adrian Post MD  Expected Discharge Date:  10/20/2015               Expected Discharge Plan:  Home/Self Care  In-House Referral:  NA  Discharge planning Services  CM Consult  [name] Acute Care Choice:  NA Choice offered to:  NA  DME Arranged:  N/A DME Agency:  NA  HH Arranged:  NA HH Agency:  NA  Status of Service:  Completed, signed off  Medicare Important Message Given:    Date Medicare IM Given:    Medicare IM give by:    Date Additional Medicare IM Given:    Additional Medicare Important Message give by:     If discussed at [place] of Stay Meetings, dates discussed:    Additional Comments:  Erenest Rasher, RN 10/20/2015, 1:30 PM

## 2015-10-20 NOTE — Progress Notes (Signed)
Patient Name: Adrian [name] Date of Encounter: 10/20/2015   Primary Cardiologist: Dr [name]  Patient Profile: 60 y/o male with h/o tonsilar cancer (s/pe chemo and resection 2005 with brief requirements of a feeding tube), tobacco and ETOH admitted 03/07 for acute diverticulitis. Seen by cards for rapid aflutter, s/p TEE w/ ?clot, no DCCV. Amio d/c'd so pt would not convert. DCM by echo>>MV abnl>>cath w/ minimal dz, NICM.  SUBJECTIVE  Feeling well. No chest pain, sob or palpitations. Wants to go home.   CURRENT MEDS . amoxicillin-clavulanate  1 tablet Oral Q12H  . apixaban  10 mg Oral BID   Followed by  . [START ON 10/26/2015] apixaban  5 mg Oral BID  . atorvastatin  80 mg Oral q1800  . folic acid  1 mg Oral Daily  . levothyroxine  125 mcg Oral QAC breakfast  . lisinopril  2.5 mg Oral Daily  . metoprolol succinate  50 mg Oral BID  . multivitamin with minerals  1 tablet Oral Daily  . off the beat book   Does not apply Once  . sodium chloride flush  3 mL Intravenous Q12H  . sodium chloride flush  3 mL Intravenous Q12H  . sodium chloride flush  3 mL Intravenous Q12H  . tamsulosin  0.4 mg Oral BID  . thiamine  100 mg Oral Daily    OBJECTIVE  Filed Vitals:   10/19/15 2317 10/20/15 0000 10/20/15 0435 10/20/15 0520  BP:   115/79   Pulse: 108  121 96  Temp:   98.8 F (37.1 C)   TempSrc:   Oral   Resp:  15 14   Height:      Weight:   153 lb (69.4 kg)   SpO2:   96%     Intake/Output Summary (Last 24 hours) at 10/20/15 0839 Last data filed at 10/19/15 2230  Gross per 24 hour  Intake    960 ml  Output      0 ml  Net    960 ml   Filed Weights   10/18/15 0600 10/19/15 0357 10/20/15 0435  Weight: 156 lb (70.761 kg) 153 lb 10.6 oz (69.7 kg) 153 lb (69.4 kg)    PHYSICAL EXAM  General: Pleasant, NAD. Neuro: Alert and oriented X 3. Moves all extremities spontaneously. Psych: Normal affect. HEENT:  Normal  Neck: Supple without bruits or JVD. Lungs:  Resp regular and  unlabored, CTA. Heart: RRR no s3, s4, or murmurs. Abdomen: Soft, non-tender, non-distended, BS + x 4.  Extremities: No clubbing, cyanosis or edema. DP/PT/Radials 2+ and equal bilaterally.  Accessory Clinical Findings  CBC  Recent Labs  10/19/15 0403 10/20/15 0330  WBC 6.2 5.9  HGB 13.3 13.0  HCT 39.9 38.8*  MCV 100.3* 100.3*  PLT 269 [id number]   Basic Metabolic Panel  Recent Labs  10/19/15 0403  NA 142  K 3.9  CL 104  CO2 27  GLUCOSE 93  BUN 7  CREATININE 0.83  CALCIUM 8.9    TELE  Aflutter with rate of 110-120s  Radiology/Studies  Dg Chest 2 View  10/19/2015  CLINICAL DATA:  Shortness of breath. Tachycardia. Atrial fibrillation. EXAM: CHEST  2 VIEW COMPARISON:  10/12/2015 FINDINGS: Heart size is within normal limits. New linear opacity in right perihilar region is consistent with subsegmental atelectasis. Tiny bilateral pleural effusions noted. No evidence of pulmonary consolidation or edema. IMPRESSION: New mild right perihilar subsegmental atelectasis and tiny bilateral pleural effusions. Electronically Signed   By: Jenny Reichmann  Kris Hartmann M.D.   On: 10/19/2015 11:43   Ct Abdomen Pelvis W Contrast  10/12/2015  CLINICAL DATA:  Onset of lower abdominal / upper pelvic pain for the past 4 days. History of constipation and diarrhea. History of tonsillar cancer. EXAM: CT ABDOMEN AND PELVIS WITH CONTRAST TECHNIQUE: Multidetector CT imaging of the abdomen and pelvis was performed using the standard protocol following bolus administration of intravenous contrast. CONTRAST:  48mL OMNIPAQUE IOHEXOL 300 MG/ML  SOLN COMPARISON:  PET-CT - 12/28/2014 FINDINGS: Extensive diverticulosis involving the sigmoid colon with geographic area of wall thickening, adjacent mesenteric stranding and vascular congestion within the mid/distal aspect of the sigmoid colon (representative axial image 68, series 2), compatible with an area of acute uncomplicated diverticulitis. No evidence of perforation or  definable/drainable fluid collection. No evidence of enteric obstruction. No pneumoperitoneum, pneumatosis or portal venous gas. The bowel is otherwise normal in course and caliber without discrete area of wall thickening. Normal appearance of the terminal ileum and appendix. Normal hepatic contour. Punctate (approximately 0.5 cm) hypoattenuating lesion within the right lobe of the liver (image 21, series 2) is too small to accurately characterize though favored to represent a hepatic cyst. Normal appearance of the gallbladder given degree distention. No radiopaque gallstones. No intra extrahepatic biliary duct dilatation. No ascites. There is symmetric enhancement and excretion of the bilateral kidneys. No definite renal stones in this postcontrast examination. No discrete renal lesions. No urine obstruction a perinephric stranding. Normal appearance of the bilateral adrenal glands, pancreas and spleen. Scattered minimal amount of calcified atherosclerotic plaque within a normal caliber abdominal aorta. The major branch vessels of the abdominal aorta appear patent on this non CTA examination. Scattered retroperitoneal lymph nodes are individually not enlarged by size criteria. No retroperitoneal, mesenteric, pelvic or inguinal lymphadenopathy. Limited visualization of the lower thorax demonstrates a punctate (approximately 0.5 cm) nodule within the left lower lobe (image 7, series 3) grossly unchanged since PET-CT performed 12/2004 and thus of benign etiology. Minimal dependent subpleural ground-glass atelectasis. No focal airspace opacities. No pleural effusion. Normal heart size. Trace amount of pericardial fluid, presumably physiologic. No acute or aggressive osseous abnormalities. No acute or aggressive osseous abnormalities. Note is made of partial lumbarization of the S1 vertebral body. There is moderate multilevel lumbar spine DDD, worse at L5-S1 with disc space height loss, endplate irregularity and  sclerosis. Regional soft tissues are normal. IMPRESSION: 1. Acute uncomplicated diverticulitis involving the mid/distal aspect of the sigmoid colon. No evidence of perforation or definable/drainable fluid collection. No evidence of enteric obstruction. If not recently performed, further evaluation with colonoscopy after the resolution of acute symptoms is recommended to exclude the presence of an underlying colonic malignancy. 2. Punctate (approximately 5 mm) left lower lobe pulmonary nodule is unchanged since the 12/2004 examination and thus of benign etiology. Electronically Signed   By: Sandi Mariscal M.D.   On: 10/12/2015 13:01   Nm Myocar Multi W/spect W/wall Motion / Ef  10/17/2015  CLINICAL DATA:  Atrial fibrillation, tachycardia, cardiomyopathy EXAM: MYOCARDIAL IMAGING WITH SPECT (REST AND PHARMACOLOGIC-STRESS) GATED LEFT VENTRICULAR WALL MOTION STUDY LEFT VENTRICULAR EJECTION FRACTION TECHNIQUE: Standard myocardial SPECT imaging was performed after resting intravenous injection of 10 mCi Tc-53m sestamibi. Subsequently, intravenous infusion of Lexiscan was performed under the supervision of the Cardiology staff. At peak effect of the drug, 30 mCi Tc-37m sestamibi was injected intravenously and standard myocardial SPECT imaging was performed. Quantitative gated imaging was also performed to evaluate left ventricular wall motion, and estimate left ventricular ejection fraction.  COMPARISON:  None. FINDINGS: Perfusion: Moderate sized, moderate severity reversible defect along the inferior apex. Wall Motion: Normal left ventricular wall motion. No left ventricular dilation. Left Ventricular Ejection Fraction: 37 % End diastolic volume 95 ml End systolic volume 59 ml IMPRESSION: 1. Moderate sized, moderate severity reversible defect along the inferior apex. 2. Normal left ventricular wall motion. 3. Left ventricular ejection fraction 37% 4. Intermediate-risk stress test findings*. *2012 Appropriate Use Criteria  for Coronary Revascularization Focused Update: J Am Coll Cardiol. [id number]. http://content.[email or web address].com.aspx?articleid=[email or web address] These results will be called to the ordering clinician or representative by the Radiologist Assistant, and communication documented in the PACS or zVision Dashboard. Electronically Signed   By: Julian Hy M.D.   On: 10/17/2015 12:19   Dg Chest Portable 1 View  10/12/2015  CLINICAL DATA:  Abdominal pain, no bowel movement for 4 days, abnormal EKG, tachycardia, history tonsillar cancer post chemotherapy and radiation therapy, smoker EXAM: PORTABLE CHEST 1 VIEW COMPARISON:  Portable exam 1145 hours compared to 10/26/2010 FINDINGS: Lordotic positioning. Normal heart size, mediastinal contours, and pulmonary vascularity. Prominent first costochondral junctions bilaterally. Lungs clear. No pleural effusion or pneumothorax. Bones unremarkable. IMPRESSION: No acute abnormalities. Electronically Signed   By: Lavonia Dana M.D.   On: 10/12/2015 12:11    ASSESSMENT AND PLAN Active Problems:   Atrial flutter with rapid ventricular response (HCC)   Diverticulitis   Diverticulitis of intestine without perforation or abscess without bleeding   Thyroid activity decreased   DCM (dilated cardiomyopathy) (HCC)   Cardiomyopathy (HCC)   Plan: TEE showed LAA thrombus so no DCCV, amio d/c'd to prevent cardioversion. Coreg switched to Toprol XL 50mg  BID. Rate still in 110s. Started Eliquis 10mg  BID x 7 days then 5mg  BID. Will need repeat TEE in 4-6 weeks. Lungs clear. CXR showed mild pleural effusion. Euvolemic. Ambulate later today to monitor HR response. If stable, likely discharge.   Signed, Leanor Kail PA-C Pager [phone]   The patient was seen, examined and discussed with Bhagat,Bhavinkumar PA-C and I agree with the above.   60 year old male with non-ischemic CMP, etoh abuse, mild non-obstructive CAD on a cath yesterday, a-flutter with RVR --> LAA  thrombus, we will start on Eliquis and aim for rate control. Switched carvedilol to metoprolol 50 mg po BID, HR still elevated, we will increase to 75 mg po BID.  Anticipated discharge later today if HR around 100 BPM, BP is soft. If additional HR control is required we would consider adding digoxin 0.125 mg po daily.  Dorothy Spark 10/20/2015

## 2015-10-20 NOTE — Discharge Summary (Addendum)
Physician Discharge Summary  Adrian [name] [id number] DOB: [birth year] DOA: 10/12/2015  PCP: [name], MD  Admit date: 10/12/2015 Discharge date: 10/20/2015  Time spent: 40 minutes  Recommendations for Outpatient Follow-up:  1. Follow-up with cardiology on 11/01/2015   Discharge Diagnoses:  Active Problems:   Atrial flutter with rapid ventricular response (HCC)   Diverticulitis   Diverticulitis of intestine without perforation or abscess without bleeding   Thyroid activity decreased   DCM (dilated cardiomyopathy) ([place])   Cardiomyopathy ([place])   Discharge Condition: Stable  Diet recommendation: Heart healthy  Filed Weights   10/18/15 0600 10/19/15 0357 10/20/15 0435  Weight: 70.761 kg (156 lb) 69.7 kg (153 lb 10.6 oz) 69.4 kg (153 lb)    History of present illness:  Adrian [name] is a 60 y.o. male  Occasion male who presented to the ED complaining of abdominal discomfort. Upon further evaluation patient was found to have new onset atrial flutter of which cardiology was consulted. Patient's main complaint is abdominal discomfort which is worse at his lower quadrants. Nothing he is aware of makes it better. The problem has been persistent since onset. The problem has been present for the last several days.  Upon further evaluation in the ED patient found to have elevated white blood cell count and was found to have acute uncomplicated diverticulitis.   Hospital Course:   New onset Atrial flutter with RVR Patient continues to remain in atrial flutter. Heart rate is not well controlled. Amiodarone was discontinued 3/11 because of LAA sludge, does not want to convert to sinus. Cardioversion could not be done due to presence of "sludge" in the left atrial appendage.  Plan is for continuing anticoagulation for 4 weeks and reattempt cardioversion. Echocardiogram shows reduced systolic function. TSH is normal.  CHA2DS2-VAScof 2 for CHF, discharged on Eliquis. Per cardiology  Toprol-XL 75 mg twice a day, digoxin 0.125 daily, heart rate is still around 110s.  LAA sludge Per TEE done on 10/15/2015 by Dr. Aundra Dubin, sludge in the LAA more formed than smoke. Cardiology recommended Eliquis 10 mg twice a day for 7 days (and on 3/21) then 5 mg twice a day.  Systolic CHF, likely chronic Appears to be well compensated. Management per cardiology. Has been started on Toprol-XL and lisinopril Patient underwent stress test which showed reversible defect in the inferior apex.  Patient underwent cardiac catheterization which did not show any significant CAD.  This is nonischemic cardiomyopathy. Discharge and Toprol-XL, lisinopril and digoxin.  Acute diverticulitis Patient has improved. Diverticulitis seems to be resolved after treated with antibiotics for over a week. Patient reports having had a colonoscopy 2 years ago which apparently was unremarkable.  Treated with antibiotics for one week (Augmentin), on discharge no antibiotics prescribed.  Left lower lobe pulmonary nodule. Appears to be stable compared to imaging studies from more than 10 years ago and hence thought to be benign. Outpatient follow-up.  Hypothyroidism. Continue with Synthroid. TSH was normal at 2.180.  Alcohol abuse Patient was counseled. He was placed on CIWA protocol. No signs or symptoms of withdrawal.   Procedures: Echocardiogram Study Conclusions - Left ventricle: The cavity size was normal. There was mild concentric hypertrophy. Systolic function was moderately to severely reduced. The estimated ejection fraction was in the range of 30% to 35%. There is severe hypokinesis in the basal and mid anteroseptal, anterior, anterolateral and apical anterior and lateral walls. The study was not technically sufficient to allow evaluation of LV diastolic dysfunction due to atrial fibrillation. -  Aortic valve: Trileaflet; normal thickness leaflets. There was no regurgitation. - Mitral valve: There was  trivial regurgitation. - Right ventricle: The cavity size was normal. Wall thickness was normal. Systolic function was mildly to moderately reduced. - Right atrium: The atrium was normal in size. - Inferior vena cava: The vessel was normal in size. - Pericardium, extracardiac: There was no pericardial effusion.  Nuclear stress test IMPRESSION: 1. Moderate sized, moderate severity reversible defect along the inferior apex. 2. Normal left ventricular wall motion. 3. Left ventricular ejection fraction 37% 4. Intermediate-risk stress test findings.  Cardiac catheterization on March 13 Mild nonobstructive CAD noted.  Consultations:  Cardiology  Discharge Exam: Filed Vitals:   10/20/15 0520 10/20/15 0808  BP:  116/84  Pulse: 96 112  Temp:  98.4 F (36.9 C)  Resp:  16   General: Alert and awake, oriented x3, not in any acute distress. HEENT: anicteric sclera, pupils reactive to light and accommodation, EOMI CVS: S1-S2 clear, no murmur rubs or gallops Chest: clear to auscultation bilaterally, no wheezing, rales or rhonchi Abdomen: soft nontender, nondistended, normal bowel sounds, no organomegaly Extremities: no cyanosis, clubbing or edema noted bilaterally Neuro: Cranial nerves II-XII intact, no focal neurological deficits  Discharge Instructions   Discharge Instructions    Diet - low sodium heart healthy    Complete by:  As directed      Increase activity slowly    Complete by:  As directed           Current Discharge Medication List    START taking these medications   Details  apixaban (ELIQUIS) 5 MG TABS tablet Take 1 tablet (5 mg total) by mouth 2 (two) times daily. Qty: 60 tablet, Refills: 0    digoxin (LANOXIN) 0.125 MG tablet Take 1 tablet (0.125 mg total) by mouth daily. Qty: 30 tablet, Refills: 0    lisinopril (PRINIVIL,ZESTRIL) 2.5 MG tablet Take 1 tablet (2.5 mg total) by mouth daily. Qty: 30 tablet, Refills: 0    metoprolol succinate (TOPROL-XL) 25 MG  24 hr tablet Take 3 tablets (75 mg total) by mouth 2 (two) times daily. Qty: 360 tablet, Refills: 0      CONTINUE these medications which have NOT CHANGED   Details  docusate sodium (COLACE) 100 MG capsule Take 100 mg by mouth 2 (two) times daily.    levothyroxine (SYNTHROID, LEVOTHROID) 125 MCG tablet TAKE 1 TABLET DAILY Qty: 30 tablet, Refills: 11    tamsulosin (FLOMAX) 0.4 MG CAPS capsule Take 1 capsule (0.4 mg total) by mouth 2 (two) times daily. Qty: 20 capsule, Refills: 0       Not on File Follow-up Information    Follow up with Richardson Dopp, PA-C. Go on 11/01/2015.   Specialties:  Physician Assistant, Radiology, Interventional Cardiology   Why:  @12 :10 for TCM   Contact information:   [address] [place] [address] [phone]        The results of significant diagnostics from this hospitalization (including imaging, microbiology, ancillary and laboratory) are listed below for reference.    Significant Diagnostic Studies: Dg Chest 2 View  10/19/2015  CLINICAL DATA:  Shortness of breath. Tachycardia. Atrial fibrillation. EXAM: CHEST  2 VIEW COMPARISON:  10/12/2015 FINDINGS: Heart size is within normal limits. New linear opacity in right perihilar region is consistent with subsegmental atelectasis. Tiny bilateral pleural effusions noted. No evidence of pulmonary consolidation or edema. IMPRESSION: New mild right perihilar subsegmental atelectasis and tiny bilateral pleural effusions. Electronically Signed  By: Earle Gell M.D.   On: 10/19/2015 11:43   Ct Abdomen Pelvis W Contrast  10/12/2015  CLINICAL DATA:  Onset of lower abdominal / upper pelvic pain for the past 4 days. History of constipation and diarrhea. History of tonsillar cancer. EXAM: CT ABDOMEN AND PELVIS WITH CONTRAST TECHNIQUE: Multidetector CT imaging of the abdomen and pelvis was performed using the standard protocol following bolus administration of intravenous contrast. CONTRAST:  42mL  OMNIPAQUE IOHEXOL 300 MG/ML  SOLN COMPARISON:  PET-CT - 12/28/2014 FINDINGS: Extensive diverticulosis involving the sigmoid colon with geographic area of wall thickening, adjacent mesenteric stranding and vascular congestion within the mid/distal aspect of the sigmoid colon (representative axial image 68, series 2), compatible with an area of acute uncomplicated diverticulitis. No evidence of perforation or definable/drainable fluid collection. No evidence of enteric obstruction. No pneumoperitoneum, pneumatosis or portal venous gas. The bowel is otherwise normal in course and caliber without discrete area of wall thickening. Normal appearance of the terminal ileum and appendix. Normal hepatic contour. Punctate (approximately 0.5 cm) hypoattenuating lesion within the right lobe of the liver (image 21, series 2) is too small to accurately characterize though favored to represent a hepatic cyst. Normal appearance of the gallbladder given degree distention. No radiopaque gallstones. No intra extrahepatic biliary duct dilatation. No ascites. There is symmetric enhancement and excretion of the bilateral kidneys. No definite renal stones in this postcontrast examination. No discrete renal lesions. No urine obstruction a perinephric stranding. Normal appearance of the bilateral adrenal glands, pancreas and spleen. Scattered minimal amount of calcified atherosclerotic plaque within a normal caliber abdominal aorta. The major branch vessels of the abdominal aorta appear patent on this non CTA examination. Scattered retroperitoneal lymph nodes are individually not enlarged by size criteria. No retroperitoneal, mesenteric, pelvic or inguinal lymphadenopathy. Limited visualization of the lower thorax demonstrates a punctate (approximately 0.5 cm) nodule within the left lower lobe (image 7, series 3) grossly unchanged since PET-CT performed 12/2004 and thus of benign etiology. Minimal dependent subpleural ground-glass  atelectasis. No focal airspace opacities. No pleural effusion. Normal heart size. Trace amount of pericardial fluid, presumably physiologic. No acute or aggressive osseous abnormalities. No acute or aggressive osseous abnormalities. Note is made of partial lumbarization of the S1 vertebral body. There is moderate multilevel lumbar spine DDD, worse at L5-S1 with disc space height loss, endplate irregularity and sclerosis. Regional soft tissues are normal. IMPRESSION: 1. Acute uncomplicated diverticulitis involving the mid/distal aspect of the sigmoid colon. No evidence of perforation or definable/drainable fluid collection. No evidence of enteric obstruction. If not recently performed, further evaluation with colonoscopy after the resolution of acute symptoms is recommended to exclude the presence of an underlying colonic malignancy. 2. Punctate (approximately 5 mm) left lower lobe pulmonary nodule is unchanged since the 12/2004 examination and thus of benign etiology. Electronically Signed   By: Sandi Mariscal M.D.   On: 10/12/2015 13:01   Nm Myocar Multi W/spect W/wall Motion / Ef  10/17/2015  CLINICAL DATA:  Atrial fibrillation, tachycardia, cardiomyopathy EXAM: MYOCARDIAL IMAGING WITH SPECT (REST AND PHARMACOLOGIC-STRESS) GATED LEFT VENTRICULAR WALL MOTION STUDY LEFT VENTRICULAR EJECTION FRACTION TECHNIQUE: Standard myocardial SPECT imaging was performed after resting intravenous injection of 10 mCi Tc-12m sestamibi. Subsequently, intravenous infusion of Lexiscan was performed under the supervision of the Cardiology staff. At peak effect of the drug, 30 mCi Tc-49m sestamibi was injected intravenously and standard myocardial SPECT imaging was performed. Quantitative gated imaging was also performed to evaluate left ventricular wall motion, and estimate left  ventricular ejection fraction. COMPARISON:  None. FINDINGS: Perfusion: Moderate sized, moderate severity reversible defect along the inferior apex. Wall  Motion: Normal left ventricular wall motion. No left ventricular dilation. Left Ventricular Ejection Fraction: 37 % End diastolic volume 95 ml End systolic volume 59 ml IMPRESSION: 1. Moderate sized, moderate severity reversible defect along the inferior apex. 2. Normal left ventricular wall motion. 3. Left ventricular ejection fraction 37% 4. Intermediate-risk stress test findings*. *2012 Appropriate Use Criteria for Coronary Revascularization Focused Update: J Am Coll Cardiol. [id number]. http://content.[email or web address].aspx?articleid=[email or web address] These results will be called to the ordering clinician or representative by the Radiologist Assistant, and communication documented in the PACS or zVision Dashboard. Electronically Signed   By: Julian Hy M.D.   On: 10/17/2015 12:19   Dg Chest Portable 1 View  10/12/2015  CLINICAL DATA:  Abdominal pain, no bowel movement for 4 days, abnormal EKG, tachycardia, history tonsillar cancer post chemotherapy and radiation therapy, smoker EXAM: PORTABLE CHEST 1 VIEW COMPARISON:  Portable exam 1145 hours compared to 10/26/2010 FINDINGS: Lordotic positioning. Normal heart size, mediastinal contours, and pulmonary vascularity. Prominent first costochondral junctions bilaterally. Lungs clear. No pleural effusion or pneumothorax. Bones unremarkable. IMPRESSION: No acute abnormalities. Electronically Signed   By: Lavonia Dana M.D.   On: 10/12/2015 12:11    Microbiology: Recent Results (from the past 240 hour(s))  MRSA PCR Screening     Status: None   Collection Time: 10/13/15  2:37 AM  Result Value Ref Range Status   MRSA by PCR NEGATIVE NEGATIVE Final    Comment:        The GeneXpert MRSA Assay (FDA approved for NASAL specimens only), is one component of a comprehensive MRSA colonization surveillance program. It is not intended to diagnose MRSA infection nor to guide or monitor treatment for MRSA infections.      Labs: Basic Metabolic  Panel:  Recent Labs Lab 10/14/15 0450 10/15/15 0520 10/16/15 0459 10/17/15 0533 10/19/15 0403  NA 140 142 142 140 142  K 3.6 3.8 3.7 4.0 3.9  CL 110 111 108 106 104  CO2 22 23 26 25 27   GLUCOSE 118* 108* 100* 100* 93  BUN 7 8 9 8 7   CREATININE 0.84 0.89 0.94 0.79 0.83  CALCIUM 8.3* 8.6* 8.6* 8.4* 8.9   Liver Function Tests: No results for input(s): AST, ALT, ALKPHOS, BILITOT, PROT, ALBUMIN in the last 168 hours. No results for input(s): LIPASE, AMYLASE in the last 168 hours. No results for input(s): AMMONIA in the last 168 hours. CBC:  Recent Labs Lab 10/16/15 0459 10/17/15 0533 10/18/15 0049 10/19/15 0403 10/20/15 0330  WBC 4.9 4.7 4.7 6.2 5.9  HGB 12.7* 12.0* 12.7* 13.3 13.0  HCT 38.4* 36.2* 37.8* 39.9 38.8*  MCV 101.3* 101.1* 100.5* 100.3* 100.3*  PLT 163 171 218 269 277   Cardiac Enzymes: No results for input(s): CKTOTAL, CKMB, CKMBINDEX, TROPONINI in the last 168 hours. BNP: BNP (last 3 results)  Recent Labs  10/12/15 1151  BNP 115.8*    ProBNP (last 3 results) No results for input(s): PROBNP in the last 8760 hours.  CBG: No results for input(s): GLUCAP in the last 168 hours.     Signed:  Birdie Hopes MD.  [facility] 10/20/2015, 11:02 AM

## 2015-10-21 NOTE — Telephone Encounter (Signed)
Patient contacted regarding discharge from [facility] on October 20, 2015.  Patient understands to follow up with provider Richardson Dopp, PA-C on November 01, 2015 at 12:10pm at [facility]. Patient understands discharge instructions? yes Patient understands medications and regiment? yes Patient understands to bring all medications to this visit? yes

## 2015-10-31 NOTE — Progress Notes (Signed)
Cardiology Office Note:    Date:  11/01/2015   ID:  SEN HARPENAU, DOB [birth year], MRN [id number]  PCP:  Kristian Covey, MD  Cardiologist:  Dr. Armanda Magic   Electrophysiologist:  n/a  Chief Complaint  Patient presents with  . Hospitalization Follow-up    AFlutter; Systolic CHF    History of Present Illness:     Adrian [name] is a 60 y.o. male with a hx of Tonsillar CA status post chemotherapy and resection in 2005, tobacco and alcohol abuse. He was admitted 3/7-3/15 with new onset atrial flutter in the setting of acute diverticulitis. He was placed on heparin for anticoagulation and amiodarone for rate control. His heart rate was difficult to control. Echocardiogram demonstrated reduced LV function with an EF of 30-35%. Therefore, he was set up for TEE guided cardioversion. TEE demonstrated probable left atrial appendage clot. Plan was for 3-4 weeks of anticoagulation before reattempting cardioversion. Inpatient Myoview was performed and demonstrated inferior ischemia. LHC demonstrated minimal plaque.  He was discharged on apixaban, metoprolol succinate 75 mg twice a day, digoxin 0.125 mg daily.  CHADS2-VASc=1 (CHF).     He returns for follow-up.  Here alone.  Overall doing well.  He was s/w tired but now feels ok. He did have some R scapular pain at DC that was positional and worse with coughing. This improved and is now resolved.  He denies chest pain.  He notes DOE with mild to mod activities.  He denies orthopnea, PND, edema. No syncope.     Past Medical History  Diagnosis Date  . Radiation NOv.3,2005-Dec. 15, 2005    6810 cGy in 30 fractions  . Tonsil cancer (HCC) 2005  . History of chemotherapy 2005    Cisplatin  . Thyroid disease     Hypothyroid  . NICM (nonischemic cardiomyopathy) (HCC) 10/14/2015    Tachy mediated? a. Echo 3/17 - Mild concentric LVH, EF 30-35%, anteroseptal, anterior, anterolateral, apical anterior, lateral hypokinesis, trivial MR, mild to moderately  reduced RVSF; b. LHC 3/17 - mRCA 20%  . Atrial flutter (HCC)     a. TEE 3/17 with ? LAA clot  . History of nuclear stress test     a. Myoview 3/17 - EF 37%, reversible defect inferior apex, intermediate risk findings     Past Surgical History  Procedure Laterality Date  . Laminectomy      C5/placement of steel plate  . Neck surgery  2003    replaced disk  . Tee without cardioversion N/A 10/15/2015    Procedure: TRANSESOPHAGEAL ECHOCARDIOGRAM (TEE);  Surgeon: Laurey Morale, MD;  Location: [facility] ENDOSCOPY;  Service: Cardiovascular;  Laterality: N/A;  . Cardiac catheterization N/A 10/18/2015    Procedure: Left Heart Cath and Coronary Angiography;  Surgeon: Kathleene Hazel, MD;  Location: [facility] INVASIVE CV LAB;  Service: Cardiovascular;  Laterality: N/A;    Current Medications: Outpatient Prescriptions Prior to Visit  Medication Sig Dispense Refill  . digoxin (LANOXIN) 0.125 MG tablet Take 1 tablet (0.125 mg total) by mouth daily. 30 tablet 0  . docusate sodium (COLACE) 100 MG capsule Take 100 mg by mouth 2 (two) times daily.    Marland Kitchen levothyroxine (SYNTHROID, LEVOTHROID) 125 MCG tablet TAKE 1 TABLET DAILY 30 tablet 11  . lisinopril (PRINIVIL,ZESTRIL) 2.5 MG tablet Take 1 tablet (2.5 mg total) by mouth daily. 30 tablet 0  . apixaban (ELIQUIS) 5 MG TABS tablet Take 2 tablets 2 mg for 6 more days till 10/26/2015, then take 1 tablet twice  a day. 60 tablet 0  . metoprolol succinate (TOPROL-XL) 25 MG 24 hr tablet Take 3 tablets (75 mg total) by mouth 2 (two) times daily. 360 tablet 0  . tamsulosin (FLOMAX) 0.4 MG CAPS capsule Take 1 capsule (0.4 mg total) by mouth 2 (two) times daily. (Patient not taking: Reported on 09/15/2014) 20 capsule 0   No facility-administered medications prior to visit.     Allergies:   Review of patient's allergies indicates not on file.   Social History   Social History  . Marital Status: Married    Spouse Name: N/A  . Number of Children: N/A  . Years of  Education: N/A   Social History Main Topics  . Smoking status: Current Every Day Smoker -- 0.50 packs/day for 20 years    Types: Cigarettes  . Smokeless tobacco: Never Used     Comment: 1/2 pack per day  . Alcohol Use: 7.2 oz/week    12 Cans of beer per week     Comment: couple of beers each day  . Drug Use: No  . Sexual Activity: Not Asked   Other Topics Concern  . None   Social History Narrative     Family History:  The patient's family history includes Cancer in his brother; Colon cancer in his father; Prostate cancer in his father; Skin cancer in his mother.   ROS:   Please see the history of present illness.    Review of Systems  Cardiovascular: Positive for irregular heartbeat.  All other systems reviewed and are negative.   Physical Exam:    VS:  BP 96/62 mmHg  Pulse 102  Ht 5\' 8"  (1.727 m)  Wt 146 lb 12.8 oz (66.588 kg)  BMI 22.33 kg/m2   GEN: Well nourished, well developed, in no acute distress HEENT: normal Neck: no JVD, no masses Cardiac: Normal S1/S2, irreg irreg rhythm; no murmurs, rubs, or gallops, no edema;  R groin without hematoma or bruit  Respiratory:  clear to auscultation bilaterally; no wheezing, rhonchi or rales GI: soft, nontender, nondistended MS: no deformity or atrophy Skin: warm and dry Neuro: No focal deficits  Psych: Alert and oriented x 3, normal affect  Wt Readings from Last 3 Encounters:  11/01/15 146 lb 12.8 oz (66.588 kg)  10/20/15 153 lb (69.4 kg)  10/12/15 145 lb 12.8 oz (66.134 kg)      Studies/Labs Reviewed:     EKG:  EKG is  ordered today.  The ekg ordered today demonstrates Atrial flutter with variable AV block, HR 103, no significant change when compared to prior tracings  Recent Labs: 10/12/2015: ALT 21; B Natriuretic Peptide 115.8*; TSH 2.180 10/13/2015: Magnesium 1.9 10/19/2015: BUN 7; Creatinine, Ser 0.83; Potassium 3.9; Sodium 142 10/20/2015: Hemoglobin 13.0; Platelets 277   Recent Lipid Panel    Component Value  Date/Time   CHOL 221* 06/20/2012 1148   TRIG 119.0 06/20/2012 1148   HDL 59.10 06/20/2012 1148   CHOLHDL 4 06/20/2012 1148   VLDL 23.8 06/20/2012 1148   LDLDIRECT 143.3 06/20/2012 1148    Additional studies/ records that were reviewed today include:   LHC 10/18/15 LAD okay RI okay LCx okay RCA mid 20% 1. Mild non-obstructive CAD 2. Atrial flutter with RVR 3. Cardiomyopathy is non-ischemic, likely rate related.  Recommendations: Start statin for mild CAD. Titrate beta blocker for better HR control. Resume heparin drip 8 hours post sheath pull. Further plans for rate control, anti-coagulation per primary team.   Myoview 10/17/15 IMPRESSION: 1. Moderate  sized, moderate severity reversible defect along the inferior apex. 2. Normal left ventricular wall motion. 3. Left ventricular ejection fraction 37% 4. Intermediate-risk stress test findings*.  TEE 10/15/15 EF 30-35%, diffuse HK, mild LAE, sludge noted in LAA (? Clot), mildly reduced RVSF  Echo 10/13/15 Mild concentric LVH, EF 30-35%, anteroseptal, anterior, anterolateral, apical anterior, lateral hypokinesis, trivial MR, mild to moderately reduced RVSF    ASSESSMENT:     1. Atrial flutter, unspecified type (HCC)   2. NICM (nonischemic cardiomyopathy) (HCC)   3. Chronic systolic CHF (congestive heart failure) (HCC)   4. Smokes tobacco daily     PLAN:     In order of problems listed above:  1. AFlutter - He remains in AFlutter.  HR is still > 100.  He has been on Apixaban without interruption since DC. He will need repeat TEE +/- DCCV after 4 weeks of uninterrupted anticoagulation.    -  Increase Toprol-XL to 100 mg in A and 75 mg in P.  -  Continue Apixaban, Digoxin  -  FU in 3-4 weeks.  Schedule TEE if still in AFlutter at that time.     -  Check BMET, CBC, Dig level at FU   2. NICM - No CAD at [facility].  Continue rate control for AFlutter.  Avoid ETOH.  Plan repeat Echo after he is back in NSR.  If EF remains < 35%, refer to  EP for ?ICD.   3. Chronic Systolic CHF - NYHA 2b.  He works at [employer]. He job is labor intensive.  I have completed FMLA paperwork to keep him out at least until his DCCV can be done.  I have also given him a note to allow him to return sooner if he can be given sedentary/light duty work.  Continue beta-blocker, ACE inhibitor.  Hold ACE if his BP runs too low with the increase in beta-blocker.   4. Tobacco abuse - He is trying to quit.    Medication Adjustments/Labs and Tests Ordered: Current medicines are reviewed at length with the patient today.  Concerns regarding medicines are outlined above.  Medication changes, Labs and Tests ordered today are outlined in the Patient Instructions noted below. Patient Instructions  Medication Instructions:  Your physician has recommended you make the following change in your medication: 1. INCREASE TORPOL TO 100 MG IN THE MORNING AND 75 MG IN THE PM   Labwork: BMET, CBC W/DIFF, DIGOXIN LEVEL (HOLD DIGOXIN THE MORNING OF LAB) THE SAME DAY AS YOUR FOLLOW UP APPT;   Testing/Procedures: NONE  Follow-Up: 11/22/15 @ 10:30 WITH Floraine Buechler, PAC  Any Other Special Instructions Will Be Listed Below (If Applicable).  If you need a refill on your cardiac medications before your next appointment, please call your pharmacy.    Signed, Tereso Newcomer, PA-C  11/01/2015 1:15 PM    [facility] [address] [place], [place], [place]  [address] Phone: [phone]; Fax: [phone]

## 2015-11-01 ENCOUNTER — Ambulatory Visit (INDEPENDENT_AMBULATORY_CARE_PROVIDER_SITE_OTHER): Payer: BLUE CROSS/BLUE SHIELD | Admitting: Physician Assistant

## 2015-11-01 ENCOUNTER — Encounter: Payer: Self-pay | Admitting: Physician Assistant

## 2015-11-01 VITALS — BP 96/62 | HR 102 | Ht 68.0 in | Wt 146.8 lb

## 2015-11-01 DIAGNOSIS — Z720 Tobacco use: Secondary | ICD-10-CM | POA: Diagnosis not present

## 2015-11-01 DIAGNOSIS — F17200 Nicotine dependence, unspecified, uncomplicated: Secondary | ICD-10-CM

## 2015-11-01 DIAGNOSIS — I5022 Chronic systolic (congestive) heart failure: Secondary | ICD-10-CM | POA: Insufficient documentation

## 2015-11-01 DIAGNOSIS — I4892 Unspecified atrial flutter: Secondary | ICD-10-CM

## 2015-11-01 DIAGNOSIS — I428 Other cardiomyopathies: Secondary | ICD-10-CM

## 2015-11-01 DIAGNOSIS — I429 Cardiomyopathy, unspecified: Secondary | ICD-10-CM | POA: Diagnosis not present

## 2015-11-01 MED ORDER — APIXABAN 5 MG PO TABS: 5.0000 mg | ORAL_TABLET | Freq: Two times a day (BID) | ORAL | Status: DC

## 2015-11-01 MED ORDER — METOPROLOL SUCCINATE ER 25 MG PO TB24: ORAL_TABLET | ORAL | Status: DC

## 2015-11-01 NOTE — Patient Instructions (Addendum)
Medication Instructions:  Your physician has recommended you make the following change in your medication: 1. INCREASE TORPOL TO 100 MG IN THE MORNING AND 75 MG IN THE PM   Labwork: BMET, CBC W/DIFF, DIGOXIN LEVEL (HOLD DIGOXIN THE MORNING OF LAB) THE SAME DAY AS YOUR FOLLOW UP APPT;   Testing/Procedures: NONE  Follow-Up: 11/22/15 @ 10:30 WITH SCOTT WEAVER, PAC  Any Other Special Instructions Will Be Listed Below (If Applicable).  If you need a refill on your cardiac medications before your next appointment, please call your pharmacy.

## 2015-11-09 ENCOUNTER — Telehealth: Payer: Self-pay | Admitting: Family Medicine

## 2015-11-09 MED ORDER — LEVOTHYROXINE SODIUM 125 MCG PO TABS: ORAL_TABLET | ORAL | Status: DC

## 2015-11-09 NOTE — Telephone Encounter (Signed)
Medication sent in for patient. 

## 2015-11-09 NOTE — Telephone Encounter (Signed)
Adrian [name] from [place] [phone]) [name] [id number] The patient is requesting a refill for levothyroxine (SYNTHROID, LEVOTHROID) 125 MCG tablet at [facility] but they don't have this medication on their record and [facility] can't transfer it because it is inactive.

## 2015-11-10 NOTE — Telephone Encounter (Signed)
Pharmacy did receive medication

## 2015-11-17 ENCOUNTER — Other Ambulatory Visit: Payer: Self-pay | Admitting: *Deleted

## 2015-11-17 MED ORDER — LISINOPRIL 2.5 MG PO TABS: 2.5000 mg | ORAL_TABLET | Freq: Every day | ORAL | Status: DC

## 2015-11-17 MED ORDER — DIGOXIN 125 MCG PO TABS: 0.1250 mg | ORAL_TABLET | Freq: Every day | ORAL | Status: DC

## 2015-11-21 NOTE — Progress Notes (Signed)
Cardiology Office Note:    Date:  11/22/2015   ID:  Adrian [name], DOB [birth year], MRN [id number]  PCP:  [name], MD  Cardiologist:  Dr. Fransico Him   Electrophysiologist:  n/a  Chief Complaint  Patient presents with  . Atrial Flutter    follow up - poss arrange TEE-DCCV     History of Present Illness:     Adrian [name] is a 60 y.o. male with a hx of Tonsillar CA status post chemotherapy and resection in 2005, tobacco and alcohol abuse. He was admitted 3/17 with new onset atrial flutter in the setting of acute diverticulitis. He was placed on heparin for anticoagulation and amiodarone for rate control. His heart rate was difficult to control. Echocardiogram demonstrated reduced LV function with an EF of 30-35%. Therefore, he was set up for TEE guided cardioversion. TEE demonstrated probable left atrial appendage clot. Plan was for 3-4 weeks of uninterrupted anticoagulation before reattempting cardioversion. Inpatient Myoview was performed and demonstrated inferior ischemia. LHC demonstrated minimal plaque.  He was discharged on apixaban, metoprolol succinate 75 mg twice a day, digoxin 0.125 mg daily.  CHADS2-VASc=1 (CHF).     Last seen 11/01/15. Heart rate remained elevated. His beta blocker was further adjusted. He returns for follow-up with an eye towards TEE guided cardioversion if he remains in atrial flutter.  He is overall stable.  Feels dizzy at times.  Also notes DOE at times.  He denies syncope, orthopnea, PND, edema. No cough or wheezing.  No bleeding issues.    Past Medical History  Diagnosis Date  . Radiation NOv.3,2005-Dec. 15, 2005    6810 cGy in 30 fractions  . Tonsil cancer ([place]) 2005  . History of chemotherapy 2005    Cisplatin  . Thyroid disease     Hypothyroid  . NICM (nonischemic cardiomyopathy) ([place]) 10/14/2015    Tachy mediated? a. Echo 3/17 - Mild concentric LVH, EF 30-35%, anteroseptal, anterior, anterolateral, apical anterior, lateral hypokinesis,  trivial MR, mild to moderately reduced RVSF; b. LHC 3/17 - mRCA 20%  . Atrial flutter ([place])     a. TEE 3/17 with ? LAA clot  . History of nuclear stress test     a. Myoview 3/17 - EF 37%, reversible defect inferior apex, intermediate risk findings     Past Surgical History  Procedure Laterality Date  . Laminectomy      C5/placement of steel plate  . Neck surgery  2003    replaced disk  . Tee without cardioversion N/A 10/15/2015    Procedure: TRANSESOPHAGEAL ECHOCARDIOGRAM (TEE);  Surgeon: Larey Dresser, MD;  Location: [facility];  Service: Cardiovascular;  Laterality: N/A;  . Cardiac catheterization N/A 10/18/2015    Procedure: Left Heart Cath and Coronary Angiography;  Surgeon: Burnell Blanks, MD;  Location: [facility] CV LAB;  Service: Cardiovascular;  Laterality: N/A;    Current Medications: Outpatient Prescriptions Prior to Visit  Medication Sig Dispense Refill  . apixaban (ELIQUIS) 5 MG TABS tablet Take 1 tablet (5 mg total) by mouth 2 (two) times daily. 60 tablet 11  . digoxin (LANOXIN) 0.125 MG tablet Take 1 tablet (0.125 mg total) by mouth daily. 90 tablet 3  . docusate sodium (COLACE) 100 MG capsule Take 100 mg by mouth 2 (two) times daily.    Marland Kitchen levothyroxine (SYNTHROID, LEVOTHROID) 125 MCG tablet TAKE 1 TABLET DAILY 30 tablet 11  . lisinopril (PRINIVIL,ZESTRIL) 2.5 MG tablet Take 1 tablet (2.5 mg total) by mouth daily. 90 tablet 3  .  metoprolol succinate (TOPROL-XL) 25 MG 24 hr tablet Take 4 tablets (100 mg) in the morning and 3 tablets (75 mg) in the evening. (Patient taking differently: Take 3 tablets (75 mg) in the morning and 3 tablets (75 mg) in the evening.) 210 tablet 11   No facility-administered medications prior to visit.     Allergies:   Review of patient's allergies indicates not on file.   Social History   Social History  . Marital Status: Married    Spouse Name: N/A  . Number of Children: N/A  . Years of Education: N/A   Social History Main  Topics  . Smoking status: Current Every Day Smoker -- 0.50 packs/day for 20 years    Types: Cigarettes  . Smokeless tobacco: Never Used     Comment: 1/2 pack per day  . Alcohol Use: 7.2 oz/week    12 Cans of beer per week     Comment: couple of beers each day  . Drug Use: No  . Sexual Activity: Not Asked   Other Topics Concern  . None   Social History Narrative     Family History:  The patient's family history includes Cancer in his brother; Colon cancer in his father; Prostate cancer in his father; Skin cancer in his mother.   ROS:   Please see the history of present illness.    Review of Systems  Neurological: Positive for dizziness.  All other systems reviewed and are negative.   Physical Exam:    VS:  BP 96/62 mmHg  Pulse 130  Ht 5\' 8"  (1.727 m)  Wt 145 lb 6.4 oz (65.953 kg)  BMI 22.11 kg/m2   GEN: Well nourished, well developed, in no acute distress HEENT: normal Neck: no JVD, no masses Cardiac: Normal S1/S2, irreg irreg rhythm; no murmurs, rubs, or gallops, no edema;  Respiratory:  clear to auscultation bilaterally; no wheezing, rhonchi or rales GI: soft, nontender, nondistended MS: no deformity or atrophy Skin: warm and dry Neuro: No focal deficits  Psych: Alert and oriented x 3, normal affect  Wt Readings from Last 3 Encounters:  11/22/15 145 lb 6.4 oz (65.953 kg)  11/01/15 146 lb 12.8 oz (66.588 kg)  10/20/15 153 lb (69.4 kg)      Studies/Labs Reviewed:     EKG:  EKG is  ordered today.  The ekg ordered today demonstrates atypical Atrial flutter, HR 130  Recent Labs: 10/12/2015: ALT 21; B Natriuretic Peptide 115.8*; TSH 2.180 10/13/2015: Magnesium 1.9 10/19/2015: BUN 7; Creatinine, Ser 0.83; Potassium 3.9; Sodium 142 10/20/2015: Hemoglobin 13.0; Platelets 277   Recent Lipid Panel    Component Value Date/Time   CHOL 221* 06/20/2012 1148   TRIG 119.0 06/20/2012 1148   HDL 59.10 06/20/2012 1148   CHOLHDL 4 06/20/2012 1148   VLDL 23.8 06/20/2012 1148     LDLDIRECT 143.3 06/20/2012 1148    Additional studies/ records that were reviewed today include:   LHC 10/18/15 LAD okay RI okay LCx okay RCA mid 20% 1. Mild non-obstructive CAD 2. Atrial flutter with RVR 3. Cardiomyopathy is non-ischemic, likely rate related.  Recommendations: Start statin for mild CAD. Titrate beta blocker for better HR control. Resume heparin drip 8 hours post sheath pull. Further plans for rate control, anti-coagulation per primary team.   Myoview 10/17/15 IMPRESSION: 1. Moderate sized, moderate severity reversible defect along the inferior apex. 2. Normal left ventricular wall motion. 3. Left ventricular ejection fraction 37% 4. Intermediate-risk stress test findings*.  TEE 10/15/15 EF 30-35%,  diffuse HK, mild LAE, sludge noted in LAA (? Clot), mildly reduced RVSF  Echo 10/13/15 Mild concentric LVH, EF 30-35%, anteroseptal, anterior, anterolateral, apical anterior, lateral hypokinesis, trivial MR, mild to moderately reduced RVSF    ASSESSMENT:     1. Atrial flutter, unspecified type ([place])   2. NICM (nonischemic cardiomyopathy) ([place])   3. Chronic systolic CHF (congestive heart failure) (HCC)   4. Smokes tobacco daily     PLAN:     In order of problems listed above:  1. AFlutter - He remains in AFlutter.  HR is uncontrolled.  He has been on Apixaban without interruption since DC (> 4 weeks).  BP is running low and he is somewhat symptomatic.   -  Arrange TEE-DCCV this week  -  Continue current regimen except hold Lisinopril.  Hold Digoxin on day of DCCV.  -  Consider Amiodarone if he does not return to NSR with DCCV.  -  Check CBC, BMET today.   2. NICM - No CAD at [facility].   Avoid ETOH.  Plan repeat Echo after he is back in NSR.  If EF remains < 35%, refer to EP for ?ICD.   3. Chronic Systolic CHF - NYHA 2b.  He works at [employer]. His job is labor intensive.  I have completed FMLA paperwork previously to keep him out at least until his DCCV could be done.  I  have provided him with a letter again today to cover him until he can FU after his DCCV.   His BP is running low as noted.  Hold Lisinopril for now.  Continue beta blocker.   4. Tobacco abuse - He is trying to quit.    Medication Adjustments/Labs and Tests Ordered: Current medicines are reviewed at length with the patient today.  Concerns regarding medicines are outlined above.  Medication changes, Labs and Tests ordered today are outlined in the Patient Instructions noted below. Patient Instructions  Medication Instructions:  Your physician has recommended you make the following change in your medication: HOLD LISINOPRIL UNTIL FURTHER ADVISED BY CARDIOLOGY Labwork: TODAY BMET, CBC W/DIFF Testing/Procedures: Your physician has requested that you have a TEE/Cardioversion. During a TEE, sound waves are used to create images of your heart. It provides your doctor with information about the size and shape of your heart and how well your heart's chambers and valves are working. In this test, a transducer is attached to the end of a flexible tube that is guided down you throat and into your esophagus (the tube leading from your mouth to your stomach) to get a more detailed image of your heart. Once the TEE has determined that a blood clot is not present, the cardioversion begins. Electrical Cardioversion uses a jolt of electricity to your heart either through paddles or wired patches attached to your chest. This is a controlled, usually prescheduled, procedure. This procedure is done at the hospital and you are not awake during the procedure. You usually go home the day of the procedure. Please see the instruction sheet given to you today for more information. Follow-Up: DR. [name] 12/06/15 @ 10:45   Any Other Special Instructions Will Be Listed Below (If Applicable). If you need a refill on your cardiac medications before your next appointment, please call your pharmacy.     Signed, Richardson Dopp,  PA-C  11/22/2015 11:50 AM    [facility], [place], [place]  [address] Phone: [phone]; Fax: [phone]

## 2015-11-22 ENCOUNTER — Ambulatory Visit (INDEPENDENT_AMBULATORY_CARE_PROVIDER_SITE_OTHER): Payer: BLUE CROSS/BLUE SHIELD | Admitting: Physician Assistant

## 2015-11-22 ENCOUNTER — Encounter: Payer: Self-pay | Admitting: Physician Assistant

## 2015-11-22 ENCOUNTER — Telehealth: Payer: Self-pay | Admitting: *Deleted

## 2015-11-22 ENCOUNTER — Encounter: Payer: Self-pay | Admitting: *Deleted

## 2015-11-22 VITALS — BP 96/62 | HR 130 | Ht 68.0 in | Wt 145.4 lb

## 2015-11-22 DIAGNOSIS — I429 Cardiomyopathy, unspecified: Secondary | ICD-10-CM

## 2015-11-22 DIAGNOSIS — I4892 Unspecified atrial flutter: Secondary | ICD-10-CM

## 2015-11-22 DIAGNOSIS — I5022 Chronic systolic (congestive) heart failure: Secondary | ICD-10-CM

## 2015-11-22 DIAGNOSIS — Z720 Tobacco use: Secondary | ICD-10-CM | POA: Diagnosis not present

## 2015-11-22 DIAGNOSIS — I428 Other cardiomyopathies: Secondary | ICD-10-CM

## 2015-11-22 DIAGNOSIS — F17200 Nicotine dependence, unspecified, uncomplicated: Secondary | ICD-10-CM

## 2015-11-22 LAB — CBC WITH DIFFERENTIAL/PLATELET
BASOS PCT: 1 %
Basophils Absolute: 65 cells/uL (ref 0–200)
Eosinophils Absolute: 130 cells/uL (ref 15–500)
Eosinophils Relative: 2 %
HEMATOCRIT: 43.5 % (ref 38.5–50.0)
HEMOGLOBIN: 15 g/dL (ref 13.2–17.1)
LYMPHS ABS: 1885 {cells}/uL (ref 850–3900)
Lymphocytes Relative: 29 %
MCH: 33.6 pg — ABNORMAL HIGH (ref 27.0–33.0)
MCHC: 34.5 g/dL (ref 32.0–36.0)
MCV: 97.5 fL (ref 80.0–100.0)
MONO ABS: 780 {cells}/uL (ref 200–950)
MPV: 10.1 fL (ref 7.5–12.5)
Monocytes Relative: 12 %
NEUTROS ABS: 3640 {cells}/uL (ref 1500–7800)
Neutrophils Relative %: 56 %
Platelets: 228 10*3/uL (ref 140–400)
RBC: 4.46 MIL/uL (ref 4.20–5.80)
RDW: 13.4 % (ref 11.0–15.0)
WBC: 6.5 10*3/uL (ref 3.8–10.8)

## 2015-11-22 LAB — BASIC METABOLIC PANEL
BUN: 12 mg/dL (ref 7–25)
CO2: 28 mmol/L (ref 20–31)
Calcium: 9 mg/dL (ref 8.6–10.3)
Chloride: 103 mmol/L (ref 98–110)
Creat: 0.88 mg/dL (ref 0.70–1.33)
Glucose, Bld: 87 mg/dL (ref 65–99)
POTASSIUM: 4.6 mmol/L (ref 3.5–5.3)
SODIUM: 137 mmol/L (ref 135–146)

## 2015-11-22 NOTE — Telephone Encounter (Signed)
Phone rings once then goes to busy signal. I will try again tomorrow.

## 2015-11-22 NOTE — Patient Instructions (Addendum)
Medication Instructions:  Your physician has recommended you make the following change in your medication: HOLD LISINOPRIL UNTIL FURTHER ADVISED BY CARDIOLOGY Labwork: TODAY BMET, CBC W/DIFF Testing/Procedures: Your physician has requested that you have a TEE/Cardioversion. During a TEE, sound waves are used to create images of your heart. It provides your doctor with information about the size and shape of your heart and how well your heart's chambers and valves are working. In this test, a transducer is attached to the end of a flexible tube that is guided down you throat and into your esophagus (the tube leading from your mouth to your stomach) to get a more detailed image of your heart. Once the TEE has determined that a blood clot is not present, the cardioversion begins. Electrical Cardioversion uses a jolt of electricity to your heart either through paddles or wired patches attached to your chest. This is a controlled, usually prescheduled, procedure. This procedure is done at the hospital and you are not awake during the procedure. You usually go home the day of the procedure. Please see the instruction sheet given to you today for more information. Follow-Up: DR. [name] 12/06/15 @ 10:45   Any Other Special Instructions Will Be Listed Below (If Applicable). If you need a refill on your cardiac medications before your next appointment, please call your pharmacy.

## 2015-11-23 NOTE — Telephone Encounter (Signed)
Pt notified lab work good. Pt verified his arrival time for his procedure 4/19 of 7:30 am for procedure 9 am.

## 2015-11-24 ENCOUNTER — Ambulatory Visit (HOSPITAL_COMMUNITY): Payer: BLUE CROSS/BLUE SHIELD | Admitting: Certified Registered Nurse Anesthetist

## 2015-11-24 ENCOUNTER — Ambulatory Visit (HOSPITAL_BASED_OUTPATIENT_CLINIC_OR_DEPARTMENT_OTHER): Payer: BLUE CROSS/BLUE SHIELD

## 2015-11-24 ENCOUNTER — Encounter (HOSPITAL_COMMUNITY): Payer: Self-pay | Admitting: *Deleted

## 2015-11-24 ENCOUNTER — Ambulatory Visit (HOSPITAL_COMMUNITY)
Admission: RE | Admit: 2015-11-24 | Discharge: 2015-11-24 | Disposition: A | Discharge status: Home / Self Care | Payer: BLUE CROSS/BLUE SHIELD | Source: Ambulatory Visit | Attending: Cardiovascular Disease | Admitting: Cardiovascular Disease

## 2015-11-24 ENCOUNTER — Encounter (HOSPITAL_COMMUNITY)
Admission: RE | Disposition: A | Discharge status: Home / Self Care | Payer: Self-pay | Source: Ambulatory Visit | Attending: Cardiovascular Disease

## 2015-11-24 DIAGNOSIS — I4892 Unspecified atrial flutter: Secondary | ICD-10-CM

## 2015-11-24 DIAGNOSIS — Z9221 Personal history of antineoplastic chemotherapy: Secondary | ICD-10-CM | POA: Insufficient documentation

## 2015-11-24 DIAGNOSIS — Z7901 Long term (current) use of anticoagulants: Secondary | ICD-10-CM | POA: Diagnosis not present

## 2015-11-24 DIAGNOSIS — E039 Hypothyroidism, unspecified: Secondary | ICD-10-CM | POA: Diagnosis not present

## 2015-11-24 DIAGNOSIS — F17210 Nicotine dependence, cigarettes, uncomplicated: Secondary | ICD-10-CM | POA: Diagnosis not present

## 2015-11-24 DIAGNOSIS — I4891 Unspecified atrial fibrillation: Secondary | ICD-10-CM | POA: Insufficient documentation

## 2015-11-24 DIAGNOSIS — I429 Cardiomyopathy, unspecified: Secondary | ICD-10-CM | POA: Diagnosis not present

## 2015-11-24 DIAGNOSIS — Z85818 Personal history of malignant neoplasm of other sites of lip, oral cavity, and pharynx: Secondary | ICD-10-CM | POA: Insufficient documentation

## 2015-11-24 DIAGNOSIS — I5022 Chronic systolic (congestive) heart failure: Secondary | ICD-10-CM | POA: Diagnosis not present

## 2015-11-24 HISTORY — PX: TEE WITHOUT CARDIOVERSION: SHX5443

## 2015-11-24 HISTORY — PX: CARDIOVERSION: SHX1299

## 2015-11-24 SURGERY — ECHOCARDIOGRAM, TRANSESOPHAGEAL
Anesthesia: Monitor Anesthesia Care

## 2015-11-24 MED ORDER — SODIUM CHLORIDE 0.9 % IV SOLN
INTRAVENOUS | Status: DC
  Administered 2015-11-24: 500 mL via INTRAVENOUS

## 2015-11-24 MED ORDER — LIDOCAINE HCL (CARDIAC) 20 MG/ML IV SOLN
INTRAVENOUS | Status: DC | PRN
  Administered 2015-11-24: 40 mg via INTRATRACHEAL

## 2015-11-24 MED ORDER — ONDANSETRON HCL 4 MG/2ML IJ SOLN: 4.0000 mg | Freq: Once | INTRAMUSCULAR | Status: DC | PRN

## 2015-11-24 MED ORDER — BUTAMBEN-TETRACAINE-BENZOCAINE 2-2-14 % EX AERO
INHALATION_SPRAY | CUTANEOUS | Status: DC | PRN
  Administered 2015-11-24: 2 via TOPICAL

## 2015-11-24 MED ORDER — SODIUM CHLORIDE 0.9% FLUSH: 3.0000 mL | Freq: Two times a day (BID) | INTRAVENOUS | Status: DC

## 2015-11-24 MED ORDER — PROPOFOL 500 MG/50ML IV EMUL
INTRAVENOUS | Status: DC | PRN
  Administered 2015-11-24: 75 ug/kg/min via INTRAVENOUS

## 2015-11-24 MED ORDER — SODIUM CHLORIDE 0.9% FLUSH: 3.0000 mL | INTRAVENOUS | Status: DC | PRN

## 2015-11-24 MED ORDER — SODIUM CHLORIDE 0.9 % IV SOLN: 250.0000 mL | INTRAVENOUS | Status: DC

## 2015-11-24 MED ORDER — HYDROMORPHONE HCL 1 MG/ML IJ SOLN: 0.5000 mg | INTRAMUSCULAR | Status: DC | PRN

## 2015-11-24 NOTE — CV Procedure (Signed)
See full note in Epic TEE/DCC: Anesthesia Propofol No LAA thrombus DCC x 1 120 J converted from atrial flutter rate 130 to NSR rate 80 On Rx Eliquis over 4 weeks No immediate neurologic sequelae  During this procedure the patient is administered propofol drip to achieve and maintain moderate conscious sedation.  The patient's heart rate, blood pressure, and oxygen saturation are monitored continuously during the procedure. The period of conscious sedation is 25 minutes, of which I was present face-to-face 100% of this time.

## 2015-11-24 NOTE — Anesthesia Postprocedure Evaluation (Signed)
Anesthesia Post Note  Patient: Adrian [name]  Procedure(s) Performed: Procedure(s) (LRB): TRANSESOPHAGEAL ECHOCARDIOGRAM (TEE) (N/A) CARDIOVERSION (N/A)  Patient location during evaluation: Endoscopy Anesthesia Type: MAC and General Level of consciousness: awake and alert and oriented Pain management: pain level controlled Vital Signs Assessment: post-procedure vital signs reviewed and stable Respiratory status: spontaneous breathing Cardiovascular status: blood pressure returned to baseline and stable Postop Assessment: no headache, no backache and no signs of nausea or vomiting Anesthetic complications: no    Last Vitals:  Filed Vitals:   11/24/15 0805 11/24/15 0910  BP: 105/68 82/51  Pulse: 128 62  Temp: 36.4 C   Resp: 15 19    Last Pain: There were no vitals filed for this visit.               [place] Pink

## 2015-11-24 NOTE — Anesthesia Preprocedure Evaluation (Signed)
Anesthesia Evaluation  Patient identified by MRN, date of birth, ID band Patient awake    Reviewed: Allergy & Precautions, NPO status , Patient's Chart, lab work & pertinent test results  Airway Mallampati: I  TM Distance: >3 FB     Dental   Pulmonary Current Smoker,     + decreased breath sounds      Cardiovascular +CHF  + dysrhythmias Atrial Fibrillation  Rhythm:Irregular Rate:Abnormal     Neuro/Psych    GI/Hepatic   Endo/Other  Hypothyroidism   Renal/GU      Musculoskeletal   Abdominal   Peds  Hematology   Anesthesia Other Findings   Reproductive/Obstetrics                             Anesthesia Physical Anesthesia Plan  ASA: III  Anesthesia Plan: General and MAC   Post-op Pain Management:    Induction: Intravenous  Airway Management Planned: Mask  Additional Equipment:   Intra-op Plan:   Post-operative Plan:   Informed Consent: I have reviewed the patients History and Physical, chart, labs and discussed the procedure including the risks, benefits and alternatives for the proposed anesthesia with the patient or authorized representative who has indicated his/her understanding and acceptance.     Plan Discussed with: CRNA, Anesthesiologist and Surgeon  Anesthesia Plan Comments:         Anesthesia Quick Evaluation

## 2015-11-24 NOTE — Transfer of Care (Signed)
Immediate Anesthesia Transfer of Care Note  Patient: Adrian [name]  Procedure(s) Performed: Procedure(s): TRANSESOPHAGEAL ECHOCARDIOGRAM (TEE) (N/A) CARDIOVERSION (N/A)  Patient Location: PACU  Anesthesia Type:MAC and General  Level of Consciousness: awake, alert  and oriented  Airway & Oxygen Therapy: Patient Spontanous Breathing  Post-op Assessment: Report given to RN and Post -op Vital signs reviewed and stable  Post vital signs: Reviewed and stable  Last Vitals:  Filed Vitals:   11/24/15 0805 11/24/15 0910  BP: 105/68 82/51  Pulse: 128 62  Temp: 36.4 C   Resp: 15 19    Complications: No apparent anesthesia complications

## 2015-11-24 NOTE — H&P (View-Only) (Signed)
Cardiology Office Note:    Date:  11/22/2015   ID:  Adrian [name], DOB 25-Aug-1955, MRN [id number]  PCP:  [name], MD  Cardiologist:  Dr. Fransico Him   Electrophysiologist:  n/a  Chief Complaint  Patient presents with  . Atrial Flutter    follow up - poss arrange TEE-DCCV     History of Present Illness:     Adrian [name] is a 60 y.o. male with a hx of Tonsillar CA status post chemotherapy and resection in 2005, tobacco and alcohol abuse. He was admitted 3/17 with new onset atrial flutter in the setting of acute diverticulitis. He was placed on heparin for anticoagulation and amiodarone for rate control. His heart rate was difficult to control. Echocardiogram demonstrated reduced LV function with an EF of 30-35%. Therefore, he was set up for TEE guided cardioversion. TEE demonstrated probable left atrial appendage clot. Plan was for 3-4 weeks of uninterrupted anticoagulation before reattempting cardioversion. Inpatient Myoview was performed and demonstrated inferior ischemia. LHC demonstrated minimal plaque.  He was discharged on apixaban, metoprolol succinate 75 mg twice a day, digoxin 0.125 mg daily.  CHADS2-VASc=1 (CHF).     Last seen 11/01/15. Heart rate remained elevated. His beta blocker was further adjusted. He returns for follow-up with an eye towards TEE guided cardioversion if he remains in atrial flutter.  He is overall stable.  Feels dizzy at times.  Also notes DOE at times.  He denies syncope, orthopnea, PND, edema. No cough or wheezing.  No bleeding issues.    Past Medical History  Diagnosis Date  . Radiation NOv.3,2005-Dec. 15, 2005    6810 cGy in 30 fractions  . Tonsil cancer ([place]) 2005  . History of chemotherapy 2005    Cisplatin  . Thyroid disease     Hypothyroid  . NICM (nonischemic cardiomyopathy) ([place]) 10/14/2015    Tachy mediated? a. Echo 3/17 - Mild concentric LVH, EF 30-35%, anteroseptal, anterior, anterolateral, apical anterior, lateral hypokinesis,  trivial MR, mild to moderately reduced RVSF; b. LHC 3/17 - mRCA 20%  . Atrial flutter ([place])     a. TEE 3/17 with ? LAA clot  . History of nuclear stress test     a. Myoview 3/17 - EF 37%, reversible defect inferior apex, intermediate risk findings     Past Surgical History  Procedure Laterality Date  . Laminectomy      C5/placement of steel plate  . Neck surgery  2003    replaced disk  . Tee without cardioversion N/A 10/15/2015    Procedure: TRANSESOPHAGEAL ECHOCARDIOGRAM (TEE);  Surgeon: Larey Dresser, MD;  Location: [facility];  Service: Cardiovascular;  Laterality: N/A;  . Cardiac catheterization N/A 10/18/2015    Procedure: Left Heart Cath and Coronary Angiography;  Surgeon: Burnell Blanks, MD;  Location: [facility];  Service: Cardiovascular;  Laterality: N/A;    Current Medications: Outpatient Prescriptions Prior to Visit  Medication Sig Dispense Refill  . apixaban (ELIQUIS) 5 MG TABS tablet Take 1 tablet (5 mg total) by mouth 2 (two) times daily. 60 tablet 11  . digoxin (LANOXIN) 0.125 MG tablet Take 1 tablet (0.125 mg total) by mouth daily. 90 tablet 3  . docusate sodium (COLACE) 100 MG capsule Take 100 mg by mouth 2 (two) times daily.    Marland Kitchen levothyroxine (SYNTHROID, LEVOTHROID) 125 MCG tablet TAKE 1 TABLET DAILY 30 tablet 11  . lisinopril (PRINIVIL,ZESTRIL) 2.5 MG tablet Take 1 tablet (2.5 mg total) by mouth daily. 90 tablet 3  .  metoprolol succinate (TOPROL-XL) 25 MG 24 hr tablet Take 4 tablets (100 mg) in the morning and 3 tablets (75 mg) in the evening. (Patient taking differently: Take 3 tablets (75 mg) in the morning and 3 tablets (75 mg) in the evening.) 210 tablet 11   No facility-administered medications prior to visit.     Allergies:   Review of patient's allergies indicates not on file.   Social History   Social History  . Marital Status: Married    Spouse Name: N/A  . Number of Children: N/A  . Years of Education: N/A   Social History Main  Topics  . Smoking status: Current Every Day Smoker -- 0.50 packs/day for 20 years    Types: Cigarettes  . Smokeless tobacco: Never Used     Comment: 1/2 pack per day  . Alcohol Use: 7.2 oz/week    12 Cans of beer per week     Comment: couple of beers each day  . Drug Use: No  . Sexual Activity: Not Asked   Other Topics Concern  . None   Social History Narrative     Family History:  The patient's family history includes Cancer in his brother; Colon cancer in his father; Prostate cancer in his father; Skin cancer in his mother.   ROS:   Please see the history of present illness.    Review of Systems  Neurological: Positive for dizziness.  All other systems reviewed and are negative.   Physical Exam:    VS:  BP 96/62 mmHg  Pulse 130  Ht 5\' 8"  (1.727 m)  Wt 145 lb 6.4 oz (65.953 kg)  BMI 22.11 kg/m2   GEN: Well nourished, well developed, in no acute distress HEENT: normal Neck: no JVD, no masses Cardiac: Normal S1/S2, irreg irreg rhythm; no murmurs, rubs, or gallops, no edema;  Respiratory:  clear to auscultation bilaterally; no wheezing, rhonchi or rales GI: soft, nontender, nondistended MS: no deformity or atrophy Skin: warm and dry Neuro: No focal deficits  Psych: Alert and oriented x 3, normal affect  Wt Readings from Last 3 Encounters:  11/22/15 145 lb 6.4 oz (65.953 kg)  11/01/15 146 lb 12.8 oz (66.588 kg)  10/20/15 153 lb (69.4 kg)      Studies/Labs Reviewed:     EKG:  EKG is  ordered today.  The ekg ordered today demonstrates atypical Atrial flutter, HR 130  Recent Labs: 10/12/2015: ALT 21; B Natriuretic Peptide 115.8*; TSH 2.180 10/13/2015: Magnesium 1.9 10/19/2015: BUN 7; Creatinine, Ser 0.83; Potassium 3.9; Sodium 142 10/20/2015: Hemoglobin 13.0; Platelets 277   Recent Lipid Panel    Component Value Date/Time   CHOL 221* 06/20/2012 1148   TRIG 119.0 06/20/2012 1148   HDL 59.10 06/20/2012 1148   CHOLHDL 4 06/20/2012 1148   VLDL 23.8 06/20/2012 1148     LDLDIRECT 143.3 06/20/2012 1148    Additional studies/ records that were reviewed today include:   LHC 10/18/15 LAD okay RI okay LCx okay RCA mid 20% 1. Mild non-obstructive CAD 2. Atrial flutter with RVR 3. Cardiomyopathy is non-ischemic, likely rate related.  Recommendations: Start statin for mild CAD. Titrate beta blocker for better HR control. Resume heparin drip 8 hours post sheath pull. Further plans for rate control, anti-coagulation per primary team.   Myoview 10/17/15 IMPRESSION: 1. Moderate sized, moderate severity reversible defect along the inferior apex. 2. Normal left ventricular wall motion. 3. Left ventricular ejection fraction 37% 4. Intermediate-risk stress test findings*.  TEE 10/15/15 EF 30-35%,  diffuse HK, mild LAE, sludge noted in LAA (? Clot), mildly reduced RVSF  Echo 10/13/15 Mild concentric LVH, EF 30-35%, anteroseptal, anterior, anterolateral, apical anterior, lateral hypokinesis, trivial MR, mild to moderately reduced RVSF    ASSESSMENT:     1. Atrial flutter, unspecified type ([place])   2. NICM (nonischemic cardiomyopathy) ([place])   3. Chronic systolic CHF (congestive heart failure) ([facility])   4. Smokes tobacco daily     PLAN:     In order of problems listed above:  1. AFlutter - He remains in AFlutter.  HR is uncontrolled.  He has been on Apixaban without interruption since DC (> 4 weeks).  BP is running low and he is somewhat symptomatic.   -  Arrange TEE-DCCV this week  -  Continue current regimen except hold Lisinopril.  Hold Digoxin on day of DCCV.  -  Consider Amiodarone if he does not return to NSR with DCCV.  -  Check CBC, BMET today.   2. NICM - No CAD at [facility].   Avoid ETOH.  Plan repeat Echo after he is back in NSR.  If EF remains < 35%, refer to EP for ?ICD.   3. Chronic Systolic CHF - NYHA 2b.  He works at [employer]. His job is labor intensive.  I have completed FMLA paperwork previously to keep him out at least until his DCCV could be done.  I  have provided him with a letter again today to cover him until he can FU after his DCCV.   His BP is running low as noted.  Hold Lisinopril for now.  Continue beta blocker.   4. Tobacco abuse - He is trying to quit.    Medication Adjustments/Labs and Tests Ordered: Current medicines are reviewed at length with the patient today.  Concerns regarding medicines are outlined above.  Medication changes, Labs and Tests ordered today are outlined in the Patient Instructions noted below. Patient Instructions  Medication Instructions:  Your physician has recommended you make the following change in your medication: HOLD LISINOPRIL UNTIL FURTHER ADVISED BY CARDIOLOGY Labwork: TODAY BMET, CBC W/DIFF Testing/Procedures: Your physician has requested that you have a TEE/Cardioversion. During a TEE, sound waves are used to create images of your heart. It provides your doctor with information about the size and shape of your heart and how well your heart's chambers and valves are working. In this test, a transducer is attached to the end of a flexible tube that is guided down you throat and into your esophagus (the tube leading from your mouth to your stomach) to get a more detailed image of your heart. Once the TEE has determined that a blood clot is not present, the cardioversion begins. Electrical Cardioversion uses a jolt of electricity to your heart either through paddles or wired patches attached to your chest. This is a controlled, usually prescheduled, procedure. This procedure is done at the hospital and you are not awake during the procedure. You usually go home the day of the procedure. Please see the instruction sheet given to you today for more information. Follow-Up: DR. [name] 12/06/15 @ 10:45   Any Other Special Instructions Will Be Listed Below (If Applicable). If you need a refill on your cardiac medications before your next appointment, please call your pharmacy.     Signed, Richardson Dopp,  PA-C  11/22/2015 11:50 AM    [address], [place], [place]  [address] Phone: [phone]; Fax: [phone]

## 2015-11-24 NOTE — Discharge Instructions (Signed)
Electrical Cardioversion, Care After °Refer to this sheet in the next few weeks. These instructions provide you with information on caring for yourself after your procedure. Your health care provider may also give you more specific instructions. Your treatment has been planned according to current medical practices, but problems sometimes occur. Call your health care provider if you have any problems or questions after your procedure. °WHAT TO EXPECT AFTER THE PROCEDURE °After your procedure, it is typical to have the following sensations: °· Some redness on the skin where the shocks were delivered. If this is tender, a sunburn lotion or hydrocortisone cream may help. °· Possible return of an abnormal heart rhythm within hours or days after the procedure. °HOME CARE INSTRUCTIONS °· Take medicines only as directed by your health care provider. Be sure you understand how and when to take your medicine. °· Learn how to feel your pulse and check it often. °· Limit your activity for 48 hours after the procedure or as directed by your health care provider. °· Avoid or minimize caffeine and other stimulants as directed by your health care provider. °SEEK MEDICAL CARE IF: °· You feel like your heart is beating too fast or your pulse is not regular. °· You have any questions about your medicines. °· You have bleeding that will not stop. °SEEK IMMEDIATE MEDICAL CARE IF: °· You are dizzy or feel faint. °· It is hard to breathe or you feel short of breath. °· There is a change in discomfort in your chest. °· Your speech is slurred or you have trouble moving an arm or leg on one side of your body. °· You get a serious muscle cramp that does not go away. °· Your fingers or toes turn cold or blue. °  °This information is not intended to replace advice given to you by your health care provider. Make sure you discuss any questions you have with your health care provider. °  °Document Released: 05/14/2013 Document Revised: 08/14/2014  Document Reviewed: 05/14/2013 °Elsevier Interactive Patient Education ©2016 [employer] °Transesophageal Echocardiogram °Transesophageal echocardiography (TEE) is a special type of test that produces images of the heart by using sound waves (echocardiogram). This type of echocardiography can obtain better images of the heart than standard echocardiography. TEE is done by passing a flexible tube down the esophagus. The heart is located in front of the esophagus. Because the heart and esophagus are close to one another, your health care provider can take very clear, detailed pictures of the heart via ultrasound waves. °TEE may be done: °· If your health care provider needs more information based on standard echocardiography findings. °· If you had a stroke. This might have happened because a clot formed in your heart. TEE can visualize different areas of the heart and check for clots. °· To check valve anatomy and function. °· To check for infection on the inside of your heart (endocarditis). °· To evaluate the dividing wall (septum) of the heart and presence of a hole that did not close after birth (patent foramen ovale or atrial septal defect). °· To help diagnose a tear in the wall of the aorta (aortic dissection). °· During cardiac valve surgery. This allows the surgeon to assess the valve repair before closing the chest. °· During a variety of other cardiac procedures to guide positioning of catheters. °· Sometimes before a cardioversion, which is a shock to convert heart rhythm back to normal. °LET YOUR HEALTH CARE PROVIDER KNOW ABOUT:  °· Any allergies you   have. °· All medicines you are taking, including vitamins, herbs, eye drops, creams, and over-the-counter medicines. °· Previous problems you or members of your family have had with the use of anesthetics. °· Any blood disorders you have. °· Previous surgeries you have had. °· Medical conditions you have. °· Swallowing difficulties. °· An esophageal  obstruction. °RISKS AND COMPLICATIONS  °Generally, TEE is a safe procedure. However, as with any procedure, complications can occur. Possible complications include an esophageal tear (rupture). °BEFORE THE PROCEDURE  °· Do not eat or drink for 6 hours before the procedure or as directed by your health care provider. °· Arrange for someone to drive you home after the procedure. Do not drive yourself home. During the procedure, you will be given medicines that can continue to make you feel drowsy and can impair your reflexes. °· An IV access tube will be started in the arm. °PROCEDURE  °· A medicine to help you relax (sedative) will be given through the IV access tube. °· A medicine may be sprayed or gargled to numb the back of the throat. °· Your blood pressure, heart rate, and breathing (vital signs) will be monitored during the procedure. °· The TEE probe is a long, flexible tube. The tip of the probe is placed into the back of the mouth, and you will be asked to swallow. This helps to pass the tip of the probe into the esophagus. Once the tip of the probe is in the correct area, your health care provider can take pictures of the heart. °· TEE is usually not a painful procedure. You may feel the probe press against the back of the throat. The probe does not enter the trachea and does not affect your breathing. °AFTER THE PROCEDURE  °· You will be in bed, resting, until you have fully returned to consciousness. °· When you first awaken, your throat may feel slightly sore and will probably still feel numb. This will improve slowly over time. °· You will not be allowed to eat or drink until it is clear that the numbness has improved. °· Once you have been able to drink, urinate, and sit on the edge of the bed without feeling sick to your stomach (nausea) or dizzy, you may be cleared to go home. °· You should have a friend or family member with you for the next 24 hours after your procedure. °  °This information is not  intended to replace advice given to you by your health care provider. Make sure you discuss any questions you have with your health care provider. °  °Document Released: 10/14/2002 Document Revised: 07/29/2013 Document Reviewed: 01/23/2013 °Elsevier Interactive Patient Education ©2016 Elsevier Inc. ° °

## 2015-11-24 NOTE — Interval H&P Note (Signed)
History and Physical Interval Note:  11/24/2015 7:49 AM  Adrian [name]  has presented today for surgery, with the diagnosis of A FLUTTER   The various methods of treatment have been discussed with the patient and family. After consideration of risks, benefits and other options for treatment, the patient has consented to  Procedure(s): TRANSESOPHAGEAL ECHOCARDIOGRAM (TEE) (N/A) CARDIOVERSION (N/A) as a surgical intervention .  The patient's history has been reviewed, patient examined, no change in status, stable for surgery.  I have reviewed the patient's chart and labs.  Questions were answered to the patient's satisfaction.     Jenkins Rouge

## 2015-11-24 NOTE — Progress Notes (Signed)
  Echocardiogram Echocardiogram Transesophageal has been performed.  Bobbye Charleston 11/24/2015, 9:05 AM

## 2015-11-25 ENCOUNTER — Encounter (HOSPITAL_COMMUNITY): Payer: Self-pay | Admitting: Cardiovascular Disease

## 2015-12-06 ENCOUNTER — Ambulatory Visit (INDEPENDENT_AMBULATORY_CARE_PROVIDER_SITE_OTHER): Payer: BLUE CROSS/BLUE SHIELD | Admitting: Cardiology

## 2015-12-06 ENCOUNTER — Encounter: Payer: Self-pay | Admitting: Cardiology

## 2015-12-06 VITALS — BP 122/62 | HR 72 | Ht 68.0 in | Wt 144.4 lb

## 2015-12-06 DIAGNOSIS — I5022 Chronic systolic (congestive) heart failure: Secondary | ICD-10-CM | POA: Diagnosis not present

## 2015-12-06 DIAGNOSIS — I428 Other cardiomyopathies: Secondary | ICD-10-CM

## 2015-12-06 DIAGNOSIS — I4892 Unspecified atrial flutter: Secondary | ICD-10-CM | POA: Diagnosis not present

## 2015-12-06 DIAGNOSIS — I429 Cardiomyopathy, unspecified: Secondary | ICD-10-CM | POA: Diagnosis not present

## 2015-12-06 DIAGNOSIS — I2510 Atherosclerotic heart disease of native coronary artery without angina pectoris: Secondary | ICD-10-CM

## 2015-12-06 HISTORY — DX: Atherosclerotic heart disease of native coronary artery without angina pectoris: I25.10

## 2015-12-06 MED ORDER — NEBIVOLOL HCL 10 MG PO TABS: 10.0000 mg | ORAL_TABLET | Freq: Every day | ORAL | Status: DC

## 2015-12-06 NOTE — Patient Instructions (Signed)
Medication Instructions:  1) STOP DIGOXIN 2) STOP TOPROL 3) START BYSTOLIC 10 mg daily  Labwork: None  Testing/Procedures: Your physician has requested that you have an echocardiogram in 2 months. Echocardiography is a painless test that uses sound waves to create images of your heart. It provides your doctor with information about the size and shape of your heart and how well your heart's chambers and valves are working. This procedure takes approximately one hour. There are no restrictions for this procedure.   Follow-Up: Your physician recommends that you schedule a follow-up appointment in 2 weeks with a PA or NP.  Your physician wants you to follow-up in: 6 months with Dr. [name]. You will receive a reminder letter in the mail two months in advance. If you don't receive a letter, please call our office to schedule the follow-up appointment.   Any Other Special Instructions Will Be Listed Below (If Applicable).     If you need a refill on your cardiac medications before your next appointment, please call your pharmacy.

## 2015-12-06 NOTE — Progress Notes (Signed)
Cardiology Office Note    Date:  12/06/2015   ID:  [name], DOB [birth year], MRN [id number]  PCP:  Adrian Post, MD  Cardiologist:  Sueanne Margarita, MD   Chief Complaint  Patient presents with  . Congestive Heart Failure  . Cardiomyopathy  . Atrial Flutter    History of Present Illness:  Adrian [name] is a 60 y.o. male with a hx of Tonsillar CA status [name] chemotherapy and resection in 2005, tobacco and alcohol abuse. He was admitted 3/17 with new onset atrial flutter in the setting of acute diverticulitis. He was placed on heparin for anticoagulation and amiodarone for rate control. His heart rate was difficult to control. Echocardiogram demonstrated reduced LV function with an EF of 30-35%. Therefore, he was set up for TEE guided cardioversion. TEE demonstrated probable left atrial appendage clot. Plan was for 3-4 weeks of uninterrupted anticoagulation before reattempting cardioversion. Inpatient Myoview was performed and demonstrated inferior ischemia. LHC demonstrated minimal plaque. He was discharged on apixaban, metoprolol succinate 75 mg twice a day, digoxin 0.125 mg daily. CHADS2-VASc=2 (CHF/CAD).   Seen 11/01/15. Heart rate remained elevated. His beta blocker was further adjusted. He returned for follow-up with extender 4/17 with an eye towards TEE guided cardioversion if he remained in atrial flutter. He underwent TEE/DCCV on 4/19 and is now back for followup.  He denies chest pain, LE edema, dizziness, palpitations or syncope.  He is feeling better since the DCCV but still has some fatigue and DOE when mowing the yard.      Past Medical History  Diagnosis Date  . Radiation NOv.3,2005-Dec. 15, 2005    6810 cGy in 30 fractions  . Tonsil cancer (Adrian [name]) 2005  . History of chemotherapy 2005    Cisplatin  . Thyroid disease     Hypothyroid  . NICM (nonischemic cardiomyopathy) ([place]) 10/14/2015    Tachy mediated? a. Echo 3/17 - Mild concentric LVH, EF 30-35%, anteroseptal,  anterior, anterolateral, apical anterior, lateral hypokinesis, trivial MR, mild to moderately reduced RVSF; b. LHC 3/17 - mRCA 20%  . Atrial flutter (Adrian [name])     a. TEE 3/17 with ? LAA clot, s/p TEE/DCCV 11/24/2015  . History of nuclear stress test     a. Myoview 3/17 - EF 37%, reversible defect inferior apex, intermediate risk findings   . CAD (coronary artery disease), native coronary artery 12/06/2015    20% mid RCA by cath 10/2015    Past Surgical History  Procedure Laterality Date  . Laminectomy      C5/placement of steel plate  . Neck surgery  2003    replaced disk  . Tee without cardioversion N/A 10/15/2015    Procedure: TRANSESOPHAGEAL ECHOCARDIOGRAM (TEE);  Surgeon: Larey Dresser, MD;  Location: [place];  Service: Cardiovascular;  Laterality: N/A;  . Cardiac catheterization N/A 10/18/2015    Procedure: Left Heart Cath and Coronary Angiography;  Surgeon: Burnell Blanks, MD;  Location: [place] [facility];  Service: Cardiovascular;  Laterality: N/A;  . Tee without cardioversion N/A 11/24/2015    Procedure: TRANSESOPHAGEAL ECHOCARDIOGRAM (TEE);  Surgeon: Josue Hector, MD;  Location: [facility] ENDOSCOPY;  Service: Cardiovascular;  Laterality: N/A;  . Cardioversion N/A 11/24/2015    Procedure: CARDIOVERSION;  Surgeon: Josue Hector, MD;  Location: [facility] ENDOSCOPY;  Service: Cardiovascular;  Laterality: N/A;    Current Medications: Outpatient Prescriptions Prior to Visit  Medication Sig Dispense Refill  . apixaban (ELIQUIS) 5 MG TABS tablet Take 1 tablet (5 mg total) by  mouth 2 (two) times daily. 60 tablet 11  . digoxin (LANOXIN) 0.125 MG tablet Take 1 tablet (0.125 mg total) by mouth daily. 90 tablet 3  . docusate sodium (COLACE) 100 MG capsule Take 100 mg by mouth 2 (two) times daily.    Marland Kitchen lisinopril (PRINIVIL,ZESTRIL) 2.5 MG tablet Take 1 tablet (2.5 mg total) by mouth daily. 90 tablet 3  . levothyroxine (SYNTHROID, LEVOTHROID) 125 MCG tablet TAKE 1 TABLET DAILY 30 tablet 11  .  metoprolol succinate (TOPROL-XL) 25 MG 24 hr tablet Take 4 tablets (100 mg) in the morning and 3 tablets (75 mg) in the evening. (Patient taking differently: Take 3 tablets (75 mg) in the morning and 3 tablets (75 mg) in the evening.) 210 tablet 11   No facility-administered medications prior to visit.     Allergies:   Review of patient's allergies indicates not on file.   Social History   Social History  . Marital Status: Married    Spouse Name: N/A  . Number of Children: N/A  . Years of Education: N/A   Social History Main Topics  . Smoking status: Current Every Day Smoker -- 0.50 packs/day for 20 years    Types: Cigarettes  . Smokeless tobacco: Never Used     Comment: 1/2 pack per day  . Alcohol Use: 7.2 oz/week    12 Cans of beer per week     Comment: couple of beers each day  . Drug Use: No  . Sexual Activity: Not Asked   Other Topics Concern  . None   Social History Narrative     Family History:  The patient's family history includes Cancer in his brother; Colon cancer in his father; Prostate cancer in his father; Skin cancer in his mother.   ROS:   Please see the history of present illness.    ROS All other systems reviewed and are negative.   PHYSICAL EXAM:   VS:  BP 122/62 mmHg  Pulse 72  Ht 5\' 8"  (1.727 m)  Wt 144 lb 6.4 oz (65.499 kg)  BMI 21.96 kg/m2   GEN: Well nourished, well developed, in no acute distress HEENT: normal Neck: no JVD, carotid bruits, or masses Cardiac: RRR; no murmurs, rubs, or gallops,no edema.  Intact distal pulses bilaterally.  Respiratory:  clear to auscultation bilaterally, normal work of breathing GI: soft, nontender, nondistended, + BS MS: no deformity or atrophy Skin: warm and dry, no rash Neuro:  Alert and Oriented x 3, Strength and sensation are intact Psych: euthymic mood, full affect  Wt Readings from Last 3 Encounters:  12/06/15 144 lb 6.4 oz (65.499 kg)  11/22/15 145 lb 6.4 oz (65.953 kg)  11/01/15 146 lb 12.8 oz  (66.588 kg)      Studies/Labs Reviewed:   EKG:  EKG is ordered today showing NSR at 62bpm with no ST changes and rSR' in V1   Recent Labs: 10/12/2015: ALT 21; B Natriuretic Peptide 115.8*; TSH 2.180 10/13/2015: Magnesium 1.9 11/22/2015: BUN 12; Creat 0.88; Hemoglobin 15.0; Platelets 228; Potassium 4.6; Sodium 137   Lipid Panel    Component Value Date/Time   CHOL 221* 06/20/2012 1148   TRIG 119.0 06/20/2012 1148   HDL 59.10 06/20/2012 1148   CHOLHDL 4 06/20/2012 1148   VLDL 23.8 06/20/2012 1148   LDLDIRECT 143.3 06/20/2012 1148    Additional studies/ records that were reviewed today include:  Hospital notes and procedure for TEE/DCCV    ASSESSMENT:    1. Chronic systolic CHF (congestive  heart failure) ([place])   2. NICM (nonischemic cardiomyopathy) ([place])   3. Atrial flutter, unspecified type ([place])   4. Coronary artery disease involving native coronary artery of native heart without angina pectoris      PLAN:  In order of problems listed above:  1. Chronic systolic CHF - appears euvolemic on exam.  Continue Toprol and ACE. 2. NIDCM with EF 30-35% by recent TEE- presumed tachycardia mediated with cath showing nonobstructive CAD.  I will check a 2D echo in 2 months to reassess LVF after resuming NSR. 3. Paroxysmal atrial flutter with LA thrombus s/p recent TEE/DCCV to NSR.  Continue Apixaban.  He is having some erectile dysfunction problems on Toprol so I will change his Toprol to Bystolic 10mg  daily to see if this helps.  Would avoid CCB in setting of LV dysfunction but if his EF returns to normal in 2 months then we can change to a CCB.   I instructed him to stop his digoxin.  I will have him followup with PA in 2 weeks to make sure he is tolerating the med changes.    Medication Adjustments/Labs and Tests Ordered: Current medicines are reviewed at length with the patient today.  Concerns regarding medicines are outlined above.  Medication changes, Labs and Tests ordered today  are listed in the Patient Instructions below.   Lurena Nida, MD  12/06/2015 11:30 AM    [facility] [address], [place], [place]  [address] Phone: [phone]; Fax: [phone]

## 2015-12-16 ENCOUNTER — Ambulatory Visit (INDEPENDENT_AMBULATORY_CARE_PROVIDER_SITE_OTHER): Payer: BLUE CROSS/BLUE SHIELD | Admitting: Nurse Practitioner

## 2015-12-16 ENCOUNTER — Encounter: Payer: Self-pay | Admitting: Nurse Practitioner

## 2015-12-16 VITALS — BP 120/72 | HR 60 | Ht 68.0 in | Wt 147.0 lb

## 2015-12-16 DIAGNOSIS — I429 Cardiomyopathy, unspecified: Secondary | ICD-10-CM | POA: Diagnosis not present

## 2015-12-16 DIAGNOSIS — I4892 Unspecified atrial flutter: Secondary | ICD-10-CM

## 2015-12-16 DIAGNOSIS — I428 Other cardiomyopathies: Secondary | ICD-10-CM

## 2015-12-16 NOTE — Patient Instructions (Signed)
Medication Instructions:  Your physician recommends that you continue on your current medications as directed. Please refer to the Current Medication list given to you today.   Labwork: None ordered  Testing/Procedures: None ordered  Follow-Up: Your physician recommends that you schedule a follow-up appointment in: [place]  Any Other Special Instructions Will Be Listed Below (If Applicable).   If you need a refill on your cardiac medications before your next appointment, please call your pharmacy.

## 2015-12-16 NOTE — Progress Notes (Signed)
Office Visit    Patient Name: Adrian [name] Date of Encounter: 12/16/2015  Primary Care Provider:  [name], MD Primary Cardiologist:  T. [name], MD  Chief Complaint     60 year old male with a history of paroxysmal atrial flutter and nonischemic myopathy who presents for follow-up.  Past Medical History    Past Medical History  Diagnosis Date  . Radiation NOv.3,2005-Dec. 15, 2005    6810 cGy in 30 fractions  . Tonsil cancer ([place]) 2005  . History of chemotherapy 2005    Cisplatin  . Hypothyroidism   . NICM (nonischemic cardiomyopathy) ([place]) 10/14/2015    a. Tachy mediated?;  b. Echo 3/17 - Mild concentric LVH, EF 30-35%, anteroseptal, anterior, anterolateral, apical anterior, lateral hypokinesis, trivial MR, mild to moderately reduced RVSF; c. LHC 3/17 - mRCA 20%  . Paroxysmal atrial flutter ([place])     a. TEE 3/17 with ? LAA clot-->s/p TEE/DCCV 11/24/2015  . CAD (coronary artery disease), native coronary artery 12/06/2015    a. 10/2015 MV: EF  37%, reversible defect inferior apex, intermediate risk findings; b. 10/2015 Cath: 20% mid RCA.   Past Surgical History  Procedure Laterality Date  . Laminectomy      C5/placement of steel plate  . Neck surgery  2003    replaced disk  . Tee without cardioversion N/A 10/15/2015    Procedure: TRANSESOPHAGEAL ECHOCARDIOGRAM (TEE);  Surgeon: Larey Dresser, MD;  Location: [place];  Service: Cardiovascular;  Laterality: N/A;  . Cardiac catheterization N/A 10/18/2015    Procedure: Left Heart Cath and Coronary Angiography;  Surgeon: Burnell Blanks, MD;  Location: [place] [facility];  Service: Cardiovascular;  Laterality: N/A;  . Tee without cardioversion N/A 11/24/2015    Procedure: TRANSESOPHAGEAL ECHOCARDIOGRAM (TEE);  Surgeon: Josue Hector, MD;  Location: [place];  Service: Cardiovascular;  Laterality: N/A;  . Cardioversion N/A 11/24/2015    Procedure: CARDIOVERSION;  Surgeon: Josue Hector, MD;  Location: [facility]  ENDOSCOPY;  Service: Cardiovascular;  Laterality: N/A;    Allergies  Not on File  History of Present Illness     60 year old male with history of tonsillar cancer status post chemotherapy and resection 2005 along with tobacco and alcohol abuse who was admitted to [facility] in March 2017 with new onset atrial flutter in the setting of acute diverticulitis. He was found to have an EF of 30-35%. He underwent TEE which showed probable left atrial appendage clot. He  Also underwent stress testing, which was abnormal, leading to a catheterization which showed minimal nonobstructive RCA disease. In the setting of left atrial appendage clot, he was placed on eliquis and beta blocker therapy. He subsequently followed up in late March and again in mid April and then underwent TEE and cardioversion on April 19. He has since been maintaining sinus rhythm. He has been feeling very well without chest pain, palpitations , PND, orthopnea, dizziness, seen to be, edema, or early satiety. He is eager to get back to work. When he was last seen on May 1, he complained of erectile dysfunction , which was attributed to Toprol therapy. He was switched by systolic and since then has been feeling better. He intermittently notes lightheadedness when he goes from a stooping to a standing position but says overall this is tolerable and controllable by getting up more slowly.  Home Medications    Prior to Admission medications   Medication Sig Start Date End Date Taking? Authorizing Provider  apixaban (ELIQUIS) 5 MG  TABS tablet Take 1 tablet (5 mg total) by mouth 2 (two) times daily. 11/01/15  Yes Scott T Kathlen Mody, PA-C  levothyroxine (SYNTHROID, LEVOTHROID) 125 MCG tablet Take 125 mcg by mouth daily before breakfast.   Yes Historical Provider, MD  nebivolol (BYSTOLIC) 10 MG tablet Take 1 tablet (10 mg total) by mouth daily. 12/06/15  Yes Sueanne Margarita, MD    Review of Systems     as above, overall doing well. Occasional  orthostatic lightheadedness.  He denies chest pain, palpitations, dyspnea, pnd, orthopnea, n, v, syncope, edema, weight gain, or early satiety.   All other systems reviewed and are otherwise negative except as noted above.  Physical Exam    VS:  BP 120/72 mmHg  Pulse 60  Ht 5\' 8"  (1.727 m)  Wt 147 lb (66.679 kg)  BMI 22.36 kg/m2 , BMI Body mass index is 22.36 kg/(m^2). GEN: Well nourished, well developed, in no acute distress. HEENT: normal. Neck: Supple, no JVD, carotid bruits, or masses. Cardiac: RRR, no murmurs, rubs, or gallops. No clubbing, cyanosis, edema.  Radials/DP/PT 2+ and equal bilaterally.  Respiratory:  Respirations regular and unlabored, clear to auscultation bilaterally. GI: Soft, nontender, nondistended, BS + x 4. MS: no deformity or atrophy. Skin: warm and dry, no rash. Neuro:  Strength and sensation are intact. Psych: Normal affect.  Accessory Clinical Findings    ECG -  Regular sinus rhythm, 60, no acute ST or T changes.  Assessment & Plan    1.   Paroxysmal atrial flutter: patient is maintaining sinus rhythm and now tolerating by systolic therapy. He has had no further erectile dysfunction. He occasionally has mild orthostasis but says it is rare and he can control it by getting up more slowly. His blood pressure and heart rate are stable today. He remains anticoagulated on eliquis therapy.   2. Nonischemic cardiopathy: EF 30-35% , presumed to be tachycardia mediated. He is scheduled for follow-up echocardiogram in July to assess for recovery of LV function in sinus rhythm. If EF remains below 35%, he will require EP referral for discussion regarding ICD candidacy. He remains on beta blocker therapy. He was previously on lisinopril but with orthostatic lightheadedness, did not tolerate.   3. Disposition: Follow-up echocardiogram in July as scheduled. Follow-up with Dr. [name] in 6 months as scheduled.   Murray Hodgkins, NP 12/16/2015, 8:59 AM

## 2016-02-14 ENCOUNTER — Other Ambulatory Visit: Payer: Self-pay | Admitting: Cardiology

## 2016-02-14 ENCOUNTER — Other Ambulatory Visit: Payer: Self-pay

## 2016-02-14 ENCOUNTER — Ambulatory Visit (HOSPITAL_COMMUNITY): Payer: BLUE CROSS/BLUE SHIELD | Attending: Cardiology

## 2016-02-14 DIAGNOSIS — Z720 Tobacco use: Secondary | ICD-10-CM | POA: Diagnosis not present

## 2016-02-14 DIAGNOSIS — I428 Other cardiomyopathies: Secondary | ICD-10-CM | POA: Insufficient documentation

## 2016-02-14 DIAGNOSIS — I429 Cardiomyopathy, unspecified: Secondary | ICD-10-CM

## 2016-02-14 DIAGNOSIS — I5022 Chronic systolic (congestive) heart failure: Secondary | ICD-10-CM | POA: Diagnosis not present

## 2016-02-14 DIAGNOSIS — I2510 Atherosclerotic heart disease of native coronary artery without angina pectoris: Secondary | ICD-10-CM | POA: Insufficient documentation

## 2016-02-18 ENCOUNTER — Telehealth: Payer: Self-pay | Admitting: Cardiology

## 2016-02-18 NOTE — Telephone Encounter (Signed)
F/u  Pt returning RN phone call- Echo results. Please call back and discuss.

## 2016-02-18 NOTE — Telephone Encounter (Signed)
Received automated message that person is not available.  Will try again later.

## 2016-02-22 NOTE — Telephone Encounter (Signed)
-----   Message from Sueanne Margarita, MD sent at 02/14/2016  4:59 PM EDT ----- Normal echo

## 2016-02-22 NOTE — Telephone Encounter (Signed)
Informed patient of results and verbal understanding expressed.   Bystolic samples and coupon card placed at the front desk for patient pick-up per request. He was grateful for assistance.

## 2016-04-24 ENCOUNTER — Encounter: Payer: Self-pay | Admitting: Family Medicine

## 2016-04-24 ENCOUNTER — Ambulatory Visit (INDEPENDENT_AMBULATORY_CARE_PROVIDER_SITE_OTHER): Payer: BLUE CROSS/BLUE SHIELD | Admitting: Family Medicine

## 2016-04-24 VITALS — BP 110/80 | HR 65 | Temp 98.1°F | Ht 68.0 in | Wt 151.0 lb

## 2016-04-24 DIAGNOSIS — Z113 Encounter for screening for infections with a predominantly sexual mode of transmission: Secondary | ICD-10-CM

## 2016-04-24 LAB — HEPATITIS B SURFACE ANTIGEN: Hepatitis B Surface Ag: NEGATIVE

## 2016-04-24 MED ORDER — IMIQUIMOD 5 % EX CREA
TOPICAL_CREAM | CUTANEOUS | 0 refills | Status: DC
Start: 2016-04-24 — End: 2017-01-23

## 2016-04-24 NOTE — Progress Notes (Signed)
Subjective:     Patient ID: Adrian [name], male   DOB: 1956-01-09, 60 y.o.   MRN: [id number]  HPI Patient here requesting STD screening. He is no history of STD and has no symptoms whatsoever currently. He has had 3 different sexual partners in recent months and basically wants to have reassurance that he has nothing asymptomatic. He has not had any dysuria. He does have a long-standing history of condylomatous type lesion left shaft of penis which has been present for several years. He does not have any known history of HPV. No history of herpes. He has not had any vesicular lesions. No adenopathy. He is not sure of his been aiming eyes for hepatitis B in the past but is doubtful  Past Medical History:  Diagnosis Date  . CAD (coronary artery disease), native coronary artery 12/06/2015   a. 10/2015 MV: EF  37%, reversible defect inferior apex, intermediate risk findings; b. 10/2015 Cath: 20% mid RCA.  Marland Kitchen History of chemotherapy 2005   Cisplatin  . Hypothyroidism   . NICM (nonischemic cardiomyopathy) ([place]) 10/14/2015   a. Tachy mediated?;  b. Echo 3/17 - Mild concentric LVH, EF 30-35%, anteroseptal, anterior, anterolateral, apical anterior, lateral hypokinesis, trivial MR, mild to moderately reduced RVSF; c. LHC 3/17 - mRCA 20%  . Paroxysmal atrial flutter ([place])    a. TEE 3/17 with ? LAA clot-->s/p TEE/DCCV 11/24/2015  . Radiation NOv.3,2005-Dec. 15, 2005   6810 cGy in 30 fractions  . Tonsil cancer ([place]) 2005   Past Surgical History:  Procedure Laterality Date  . CARDIAC CATHETERIZATION N/A 10/18/2015   Procedure: Left Heart Cath and Coronary Angiography;  Surgeon: Burnell Blanks, MD;  Location: [place] [facility];  Service: Cardiovascular;  Laterality: N/A;  . CARDIOVERSION N/A 11/24/2015   Procedure: CARDIOVERSION;  Surgeon: Josue Hector, MD;  Location: [facility] ENDOSCOPY;  Service: Cardiovascular;  Laterality: N/A;  . LAMINECTOMY     C5/placement of steel plate  . NECK SURGERY  2003   replaced disk  . TEE WITHOUT CARDIOVERSION N/A 10/15/2015   Procedure: TRANSESOPHAGEAL ECHOCARDIOGRAM (TEE);  Surgeon: Larey Dresser, MD;  Location: [place];  Service: Cardiovascular;  Laterality: N/A;  . TEE WITHOUT CARDIOVERSION N/A 11/24/2015   Procedure: TRANSESOPHAGEAL ECHOCARDIOGRAM (TEE);  Surgeon: Josue Hector, MD;  Location: [facility] ENDOSCOPY;  Service: Cardiovascular;  Laterality: N/A;    reports that he has been smoking Cigarettes.  He has a 10.00 pack-year smoking history. He has never used smokeless tobacco. He reports that he drinks about 7.2 oz of alcohol per week . He reports that he does not use drugs. family history includes Cancer in his brother; Colon cancer in his father; Prostate cancer in his father; Skin cancer in his mother. Not on File   Review of Systems  Constitutional: Negative for chills and fatigue.  Skin: Negative for rash.  Hematological: Negative for adenopathy.       Objective:   Physical Exam  Constitutional: He appears well-developed and well-nourished.  Cardiovascular: Normal rate and regular rhythm.   Pulmonary/Chest: Effort normal and breath sounds normal. No respiratory distress. He has no wheezes. He has no rales.  Skin:  Patient solitary condylomatous type lesion left shaft of penis no other skin lesions noted       Assessment:     STD screening    Plan:     -Check further labs with HIV, RPR, hepatitis B surface antigen, GC and Chlamydia screen -Discussed possible trial of Aldara cream to  penile lesions 3 times weekly with prescription written and reviewed potential side effects -STD prevention discussed-with barrior protection  [name] MD [facility]

## 2016-04-24 NOTE — Progress Notes (Signed)
Pre visit review using our clinic review tool, if applicable. No additional management support is needed unless otherwise documented below in the visit note. 

## 2016-04-25 LAB — GC/CHLAMYDIA PROBE AMP
CT PROBE, AMP APTIMA: NOT DETECTED
GC PROBE AMP APTIMA: NOT DETECTED

## 2016-04-25 LAB — HIV ANTIBODY (ROUTINE TESTING W REFLEX): HIV: NONREACTIVE

## 2016-04-25 LAB — RPR

## 2016-06-14 ENCOUNTER — Telehealth: Payer: Self-pay | Admitting: Cardiology

## 2016-06-14 NOTE — Telephone Encounter (Signed)
Called pt and left a message informing pt that I sent in a 90 day supply of his medication for Eliquis 5 mg tablet and Bystolic 10 mg tablet to his pharmacy as requested and informed him that he would have to get his levothyroxine 0.125 mg tablet refill with PCP and if he has any other problems, questions or concerns to call our office.

## 2016-06-14 NOTE — Telephone Encounter (Signed)
°*  STAT* If patient is at the pharmacy, call can be transferred to refill team.   1. Which medications need to be refilled? (please list name of each medication and dose if known) Eliquis 5mg  , Bystolic 10mg  and Levothyaroxine .125mg s(56mcg's)  2. Which pharmacy/location (including street and city if local pharmacy) is medication to be sent to?[facility] on [address]   3. Do they need a 30 day or 90 day supply? [place]

## 2016-11-08 ENCOUNTER — Other Ambulatory Visit: Payer: Self-pay | Admitting: Family Medicine

## 2016-12-05 ENCOUNTER — Other Ambulatory Visit: Payer: Self-pay | Admitting: Cardiology

## 2017-01-03 ENCOUNTER — Other Ambulatory Visit: Payer: Self-pay | Admitting: Cardiology

## 2017-01-15 ENCOUNTER — Telehealth: Payer: Self-pay | Admitting: Cardiology

## 2017-01-15 MED ORDER — NEBIVOLOL HCL 10 MG PO TABS: 10.0000 mg | ORAL_TABLET | Freq: Every day | ORAL | 0 refills | Status: DC

## 2017-01-15 NOTE — Telephone Encounter (Signed)
New message     *STAT* If patient is at the pharmacy, call can be transferred to refill team.   1. Which medications need to be refilled? (please list name of each medication and dose if known) bystolic 10 mg  2. Which pharmacy/location (including street and city if local pharmacy) is medication to be sent to? [place] and [facility]  3. Do they need a 30 day or 90 day supply? [place]

## 2017-01-15 NOTE — Telephone Encounter (Signed)
Patient has appointment 6/19. 30 day Rx called in. Patient will get new Rx at [place].

## 2017-01-23 ENCOUNTER — Ambulatory Visit (INDEPENDENT_AMBULATORY_CARE_PROVIDER_SITE_OTHER): Payer: BLUE CROSS/BLUE SHIELD | Admitting: Physician Assistant

## 2017-01-23 ENCOUNTER — Encounter: Payer: Self-pay | Admitting: Physician Assistant

## 2017-01-23 ENCOUNTER — Encounter (INDEPENDENT_AMBULATORY_CARE_PROVIDER_SITE_OTHER): Payer: Self-pay

## 2017-01-23 VITALS — BP 120/60 | HR 64 | Ht 68.0 in | Wt 145.0 lb

## 2017-01-23 DIAGNOSIS — I2510 Atherosclerotic heart disease of native coronary artery without angina pectoris: Secondary | ICD-10-CM | POA: Diagnosis not present

## 2017-01-23 DIAGNOSIS — E039 Hypothyroidism, unspecified: Secondary | ICD-10-CM | POA: Diagnosis not present

## 2017-01-23 DIAGNOSIS — I428 Other cardiomyopathies: Secondary | ICD-10-CM

## 2017-01-23 DIAGNOSIS — I4892 Unspecified atrial flutter: Secondary | ICD-10-CM

## 2017-01-23 DIAGNOSIS — F17200 Nicotine dependence, unspecified, uncomplicated: Secondary | ICD-10-CM

## 2017-01-23 DIAGNOSIS — Z720 Tobacco use: Secondary | ICD-10-CM | POA: Diagnosis not present

## 2017-01-23 MED ORDER — NEBIVOLOL HCL 10 MG PO TABS: 10.0000 mg | ORAL_TABLET | Freq: Every day | ORAL | 3 refills | Status: DC

## 2017-01-23 MED ORDER — APIXABAN 5 MG PO TABS: 5.0000 mg | ORAL_TABLET | Freq: Two times a day (BID) | ORAL | 3 refills | Status: DC

## 2017-01-23 NOTE — Patient Instructions (Signed)
Medication Instructions:  Your physician recommends that you continue on your current medications as directed. Please refer to the Current Medication list given to you today.  Labwork: Please have fasting lab work TODAY (CMET, CBC, TSH, Lipids)  Testing/Procedures: NONE  Follow-Up: Your physician wants you to follow-up in: 1 year with Dr. [name]. You will receive a reminder letter in the mail two months in advance. If you don't receive a letter, please call our office to schedule the follow-up appointment.   Any Other Special Instructions Will Be Listed Below (If Applicable).     If you need a refill on your cardiac medications before your next appointment, please call your pharmacy.

## 2017-01-23 NOTE — Progress Notes (Signed)
Cardiology Office Note    Date:  01/23/2017   ID:  Adrian [name], DOB 1956-02-09, MRN [id number]  PCP:  [name], MD  Cardiologist: Dr. [name]  Chief Complaint  Patient presents with  . Follow-up    History of Present Illness:  Adrian [name] is a 61 y.o. male with history of atrial flutter in the setting of acute diverticulitis 10/2015. Also found to have a nonischemic cardiomyopathy LVEF 30-35%. TEE showed probable left atrial appendage clot. He had an abnormal stress test followed by cardiac catheterization which showed minimal nonobstructive RCA disease. He was placed on Eliquis and beta blocker. He underwent TEE guided cardioversion 11/24/2015. Patient was last seen by Jorja Loa, [facility] 12/2015 and was doing well. He was having a mild orthostasis but patient controlled. Patient was supposed to have follow-up 2-D echo in July 2017 Which showed normal LV EF 55-60% with no wall motion abnormalities.  Patient comes in today for yearly follow-up. He denies any chest pain, palpitations, dyspnea, dyspnea on exertion, dizziness, or presyncope. He never felt when he was in atrial flutter but doesn't think he had. He does not get any regular exercise outside of work lifting boxes at [employer]. He does know cardio. He continues to smoke at least a half a pack of cigarettes daily. He hasn't had blood work in over a year.  Past Medical History:  Diagnosis Date  . CAD (coronary artery disease), native coronary artery 12/06/2015   a. 10/2015 MV: EF  37%, reversible defect inferior apex, intermediate risk findings; b. 10/2015 Cath: 20% mid RCA.  Marland Kitchen History of chemotherapy 2005   Cisplatin  . Hypothyroidism   . NICM (nonischemic cardiomyopathy) ([place]) 10/14/2015   a. Tachy mediated?;  b. Echo 3/17 - Mild concentric LVH, EF 30-35%, anteroseptal, anterior, anterolateral, apical anterior, lateral hypokinesis, trivial MR, mild to moderately reduced RVSF; c. LHC 3/17 - mRCA 20%  . Paroxysmal atrial flutter ([place])     a. TEE 3/17 with ? LAA clot-->s/p TEE/DCCV 11/24/2015  . Radiation NOv.3,2005-Dec. 15, 2005   6810 cGy in 30 fractions  . 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 CARDIAC CATHETERIZATION N/A 10/18/2015   Procedure: Left Heart Cath and Coronary Angiography;  Surgeon: Burnell Blanks, MD;  Location: [place] CV LAB;  Service: Cardiovascular;  Laterality: N/A;  . CARDIOVERSION N/A 11/24/2015   Procedure: CARDIOVERSION;  Surgeon: Josue Hector, MD;  Location: [facility] ENDOSCOPY;  Service: Cardiovascular;  Laterality: N/A;  . LAMINECTOMY     C5/placement of steel plate  . NECK SURGERY  2003   replaced disk  . TEE WITHOUT CARDIOVERSION N/A 10/15/2015   Procedure: TRANSESOPHAGEAL ECHOCARDIOGRAM (TEE);  Surgeon: Larey Dresser, MD;  Location: [place];  Service: Cardiovascular;  Laterality: N/A;  . TEE WITHOUT CARDIOVERSION N/A 11/24/2015   Procedure: TRANSESOPHAGEAL ECHOCARDIOGRAM (TEE);  Surgeon: Josue Hector, MD;  Location: [facility] ENDOSCOPY;  Service: Cardiovascular;  Laterality: N/A;    Current Medications: Outpatient Medications Prior to Visit  Medication Sig Dispense Refill  . levothyroxine (SYNTHROID, LEVOTHROID) 125 MCG tablet Take 125 mcg by mouth daily before breakfast.    . apixaban (ELIQUIS) 5 MG TABS tablet Take 1 tablet (5 mg total) by mouth 2 (two) times daily. 60 tablet 11  . nebivolol (BYSTOLIC) 10 MG tablet Take 1 tablet (10 mg total) by mouth daily. 30 tablet 0  . imiquimod (ALDARA) 5 % cream Apply topically 3 (three) times  a week. (Patient not taking: Reported on 01/23/2017) 12 each 0  . levothyroxine (SYNTHROID, LEVOTHROID) 125 MCG tablet TAKE 1 TABLET BY MOUTH DAILY (Patient not taking: Reported on 01/23/2017) 90 tablet 0   No facility-administered medications prior to visit.      Allergies:   Patient has no allergy information on record.   Social History   Social History  . Marital status: Married    Spouse name: N/A    . Number of children: N/A  . Years of education: N/A   Social History Main Topics  . Smoking status: Current Every Day Smoker    Packs/day: 0.50    Years: 20.00    Types: Cigarettes  . Smokeless tobacco: Never Used     Comment: 1/2 pack per day  . Alcohol use 7.2 oz/week    12 Cans of beer per week     Comment: couple of beers each day  . Drug use: No  . Sexual activity: Not Asked   Other Topics Concern  . None   Social History Narrative  . None     Family History:  The patient's family history includes Cancer in his brother; Colon cancer in his father; Prostate cancer in his father; Skin cancer in his mother.   ROS:   Please see the history of present illness.    Review of Systems  Constitution: Negative.  HENT: Negative.   Cardiovascular: Negative.   Respiratory: Negative.   Endocrine: Negative.   Hematologic/Lymphatic: Negative.   Musculoskeletal: Negative.   Gastrointestinal: Negative.   Genitourinary: Negative.   Neurological: Negative.    All other systems reviewed and are negative.   PHYSICAL EXAM:   VS:  BP 120/60   Pulse 64   Ht 5\' 8"  (1.727 m)   Wt 145 lb (65.8 kg)   SpO2 98%   BMI 22.05 kg/m   Physical Exam  GEN: Thin, in no acute distress  Neck: no JVD, carotid bruits, or masses Cardiac:RRR; no murmurs, rubs, or gallops  Respiratory:  Decreased breath sounds with inspiratory and expiratory wheezing GI: soft, nontender, nondistended, + BS Ext: without cyanosis, clubbing, or edema, Good distal pulses bilaterally Neuro:  Alert and Oriented x 3 Psych: euthymic mood, full affect  Wt Readings from Last 3 Encounters:  01/23/17 145 lb (65.8 kg)  04/24/16 151 lb (68.5 kg)  12/16/15 147 lb (66.7 kg)      Studies/Labs Reviewed:   EKG:  EKG is ordered today.  The ekg ordered today demonstrates normal sinus rhythm, normal EKG  Recent Labs: No results found for requested labs within last 8760 hours.   Lipid Panel    Component Value Date/Time    CHOL 221 (H) 06/20/2012 1148   TRIG 119.0 06/20/2012 1148   HDL 59.10 06/20/2012 1148   CHOLHDL 4 06/20/2012 1148   VLDL 23.8 06/20/2012 1148   LDLDIRECT 143.3 06/20/2012 1148    Additional studies/ records that were reviewed today include:   Limited 2-D echo 02/14/16  Study Conclusions   - Left ventricle: The cavity size was normal. Systolic function was   normal. The estimated ejection fraction was in the range of 55%   to 60%. Wall motion was normal; there were no regional wall   motion abnormalities.     ASSESSMENT:    1. Paroxysmal atrial flutter ([place])   2. Coronary artery disease involving native coronary artery of native heart without angina pectoris   3. NICM (nonischemic cardiomyopathy) ([place])   4. Hypothyroidism, unspecified  type   5. Smokes tobacco daily      PLAN:  In order of problems listed above:  PAF in the setting of diverticulitis with left atrial clot 10/2015 underwent TEE guided cardioversion 11/2015. No recurrence. Maintained on Eliquis and bystolic. Follow-up with Dr. [name] in one year.  CAD with nonobstructive disease on cardiac cath 2017. Has not had labs checked in over a year. Fasting lipid panel was in 2013. We'll check today.  Nonischemic cardiomyopathy ejection fraction 55-60% with no wall motion abnormalities on limited echo 02/2016. No symptoms of heart failure. Recommend regular exercise.  Hypothyroidism made by primary care. We'll check TSH as he hasn't had it done a long time.  Tobacco abuse smoking cessation discussed with patient. He is wheezing on exam today.    Medication Adjustments/Labs and Tests Ordered: Current medicines are reviewed at length with the patient today.  Concerns regarding medicines are outlined above.  Medication changes, Labs and Tests ordered today are listed in the Patient Instructions below. Patient Instructions  Medication Instructions:  Your physician recommends that you continue on your current medications  as directed. Please refer to the Current Medication list given to you today.  Labwork: Please have fasting lab work TODAY (CMET, CBC, TSH, Lipids)  Testing/Procedures: NONE  Follow-Up: Your physician wants you to follow-up in: 1 year with Dr. [name]. You will receive a reminder letter in the mail two months in advance. If you don't receive a letter, please call our office to schedule the follow-up appointment.   Any Other Special Instructions Will Be Listed Below (If Applicable).     If you need a refill on your cardiac medications before your next appointment, please call your pharmacy.      Signed, Ermalinda Barrios, PA-C  01/23/2017 8:25 AM    [facility] [place], [place], [place]  [address] Phone: [phone]; Fax: [phone]

## 2017-01-24 ENCOUNTER — Telehealth: Payer: Self-pay | Admitting: *Deleted

## 2017-01-24 DIAGNOSIS — Z79899 Other long term (current) drug therapy: Secondary | ICD-10-CM

## 2017-01-24 LAB — CBC
HEMATOCRIT: 45.9 % (ref 37.5–51.0)
Hemoglobin: 15.4 g/dL (ref 13.0–17.7)
MCH: 31.6 pg (ref 26.6–33.0)
MCHC: 33.6 g/dL (ref 31.5–35.7)
MCV: 94 fL (ref 79–97)
Platelets: 214 10*3/uL (ref 150–379)
RBC: 4.88 x10E6/uL (ref 4.14–5.80)
RDW: 14.5 % (ref 12.3–15.4)
WBC: 4.1 10*3/uL (ref 3.4–10.8)

## 2017-01-24 LAB — LIPID PANEL
Chol/HDL Ratio: 4.4 ratio (ref 0.0–5.0)
Cholesterol, Total: 228 mg/dL — ABNORMAL HIGH (ref 100–199)
HDL: 52 mg/dL (ref >39)
LDL Calculated: 128 mg/dL — ABNORMAL HIGH (ref 0–99)
TRIGLYCERIDES: 241 mg/dL — AB (ref 0–149)
VLDL CHOLESTEROL CAL: 48 mg/dL — AB (ref 5–40)

## 2017-01-24 LAB — COMPREHENSIVE METABOLIC PANEL
A/G RATIO: 1.6 (ref 1.2–2.2)
ALBUMIN: 4.3 g/dL (ref 3.6–4.8)
ALT: 18 IU/L (ref 0–44)
AST: 26 IU/L (ref 0–40)
Alkaline Phosphatase: 72 IU/L (ref 39–117)
BUN / CREAT RATIO: 7 — AB (ref 10–24)
BUN: 6 mg/dL — ABNORMAL LOW (ref 8–27)
Bilirubin Total: 0.4 mg/dL (ref 0.0–1.2)
CALCIUM: 9.4 mg/dL (ref 8.6–10.2)
CO2: 21 mmol/L (ref 20–29)
Chloride: 96 mmol/L (ref 96–106)
Creatinine, Ser: 0.86 mg/dL (ref 0.76–1.27)
GFR, EST AFRICAN AMERICAN: 108 mL/min/{1.73_m2} (ref >59)
GFR, EST NON AFRICAN AMERICAN: 94 mL/min/{1.73_m2} (ref >59)
GLOBULIN, TOTAL: 2.7 g/dL (ref 1.5–4.5)
Glucose: 91 mg/dL (ref 65–99)
POTASSIUM: 4.3 mmol/L (ref 3.5–5.2)
SODIUM: 134 mmol/L (ref 134–144)
TOTAL PROTEIN: 7 g/dL (ref 6.0–8.5)

## 2017-01-24 LAB — TSH: TSH: 2.5 u[IU]/mL (ref 0.450–4.500)

## 2017-01-24 MED ORDER — ATORVASTATIN CALCIUM 40 MG PO TABS: 40.0000 mg | ORAL_TABLET | Freq: Every day | ORAL | 3 refills | Status: DC

## 2017-01-24 NOTE — Telephone Encounter (Signed)
-----   Message from Imogene Burn, PA-C sent at 01/24/2017  7:53 AM EDT ----- Cholesterol triglycerides and LDL all high. Recommend Lipitor 20 mg once daily. Recheck lipids and lft's in 6 weeks. All other blood works stable but seems a little dehydrated. Drink more water especially in the heat.

## 2017-02-05 ENCOUNTER — Other Ambulatory Visit: Payer: Self-pay | Admitting: Family Medicine

## 2017-03-09 ENCOUNTER — Other Ambulatory Visit: Payer: BLUE CROSS/BLUE SHIELD | Admitting: *Deleted

## 2017-03-09 DIAGNOSIS — Z79899 Other long term (current) drug therapy: Secondary | ICD-10-CM

## 2017-03-09 LAB — LIPID PANEL
CHOL/HDL RATIO: 2.6 ratio (ref 0.0–5.0)
Cholesterol, Total: 159 mg/dL (ref 100–199)
HDL: 61 mg/dL (ref >39)
LDL CALC: 66 mg/dL (ref 0–99)
Triglycerides: 160 mg/dL — ABNORMAL HIGH (ref 0–149)
VLDL Cholesterol Cal: 32 mg/dL (ref 5–40)

## 2017-03-09 LAB — HEPATIC FUNCTION PANEL
ALT: 19 IU/L (ref 0–44)
AST: 23 IU/L (ref 0–40)
Albumin: 4.4 g/dL (ref 3.6–4.8)
Alkaline Phosphatase: 84 IU/L (ref 39–117)
BILIRUBIN TOTAL: 0.4 mg/dL (ref 0.0–1.2)
BILIRUBIN, DIRECT: 0.17 mg/dL (ref 0.00–0.40)
TOTAL PROTEIN: 7.1 g/dL (ref 6.0–8.5)

## 2017-05-11 ENCOUNTER — Other Ambulatory Visit: Payer: Self-pay | Admitting: Family Medicine

## 2017-10-03 ENCOUNTER — Encounter: Payer: Self-pay | Admitting: Internal Medicine

## 2017-11-26 ENCOUNTER — Telehealth: Payer: Self-pay | Admitting: Family Medicine

## 2017-11-27 ENCOUNTER — Other Ambulatory Visit: Payer: Self-pay

## 2017-11-27 MED ORDER — LEVOTHYROXINE SODIUM 125 MCG PO TABS: 125.0000 ug | ORAL_TABLET | Freq: Every day | ORAL | 0 refills | Status: DC

## 2017-11-27 NOTE — Telephone Encounter (Signed)
Called patient and left a message on voice machine to call us back as he stated to not leave any messages on his answering machine. Wanted to let him know that 15 tablets of the Levothyroxine 125 mcg have been sent to [place] until his visit with [place].

## 2017-11-27 NOTE — Telephone Encounter (Signed)
Yes.  Refill until then.  Thanks.

## 2017-11-27 NOTE — Telephone Encounter (Signed)
Please see message.  Please advise. 

## 2017-11-27 NOTE — Telephone Encounter (Signed)
Pt has appt on 12/05/17, he is asking if this cn be filled until then? [facility] on [address]

## 2017-12-05 ENCOUNTER — Encounter: Payer: Self-pay | Admitting: Family Medicine

## 2017-12-05 ENCOUNTER — Ambulatory Visit: Payer: BLUE CROSS/BLUE SHIELD | Admitting: Family Medicine

## 2017-12-05 ENCOUNTER — Other Ambulatory Visit: Payer: BLUE CROSS/BLUE SHIELD

## 2017-12-05 VITALS — BP 98/64 | HR 106 | Temp 97.8°F | Wt 145.6 lb

## 2017-12-05 DIAGNOSIS — E785 Hyperlipidemia, unspecified: Secondary | ICD-10-CM | POA: Diagnosis not present

## 2017-12-05 DIAGNOSIS — I4892 Unspecified atrial flutter: Secondary | ICD-10-CM | POA: Diagnosis not present

## 2017-12-05 DIAGNOSIS — D729 Disorder of white blood cells, unspecified: Secondary | ICD-10-CM

## 2017-12-05 DIAGNOSIS — I5022 Chronic systolic (congestive) heart failure: Secondary | ICD-10-CM | POA: Diagnosis not present

## 2017-12-05 DIAGNOSIS — E039 Hypothyroidism, unspecified: Secondary | ICD-10-CM | POA: Diagnosis not present

## 2017-12-05 LAB — CBC WITH DIFFERENTIAL/PLATELET
Basophils Absolute: 0 10*3/uL (ref 0.0–0.1)
Basophils Relative: 0.5 % (ref 0.0–3.0)
EOS ABS: 0 10*3/uL (ref 0.0–0.7)
Eosinophils Relative: 0.8 % (ref 0.0–5.0)
HEMATOCRIT: 43.4 % (ref 39.0–52.0)
Hemoglobin: 14.9 g/dL (ref 13.0–17.0)
LYMPHS ABS: 3.6 10*3/uL (ref 0.7–4.0)
LYMPHS PCT: 82.5 % — AB (ref 12.0–46.0)
MCHC: 34.3 g/dL (ref 30.0–36.0)
MCV: 96.1 fl (ref 78.0–100.0)
MONO ABS: 0.6 10*3/uL (ref 0.1–1.0)
Monocytes Relative: 13.8 % — ABNORMAL HIGH (ref 3.0–12.0)
NEUTROS ABS: 0.1 10*3/uL — AB (ref 1.4–7.7)
NEUTROS PCT: 2.4 % — AB (ref 43.0–77.0)
PLATELETS: 170 10*3/uL (ref 150.0–400.0)
RBC: 4.51 Mil/uL (ref 4.22–5.81)
RDW: 13.9 % (ref 11.5–15.5)
WBC: 4.4 10*3/uL (ref 4.0–10.5)

## 2017-12-05 LAB — LIPID PANEL
CHOLESTEROL: 192 mg/dL (ref 0–200)
HDL: 50.9 mg/dL (ref >39.00)
LDL Cholesterol: 115 mg/dL — ABNORMAL HIGH (ref 0–99)
NonHDL: 140.93
Total CHOL/HDL Ratio: 4
Triglycerides: 130 mg/dL (ref 0.0–149.0)
VLDL: 26 mg/dL (ref 0.0–40.0)

## 2017-12-05 LAB — COMPREHENSIVE METABOLIC PANEL
ALBUMIN: 4.1 g/dL (ref 3.5–5.2)
ALT: 23 U/L (ref 0–53)
AST: 32 U/L (ref 0–37)
Alkaline Phosphatase: 68 U/L (ref 39–117)
BILIRUBIN TOTAL: 0.5 mg/dL (ref 0.2–1.2)
BUN: 8 mg/dL (ref 6–23)
CALCIUM: 9 mg/dL (ref 8.4–10.5)
CO2: 27 mEq/L (ref 19–32)
CREATININE: 0.82 mg/dL (ref 0.40–1.50)
Chloride: 100 mEq/L (ref 96–112)
GFR: 101.17 mL/min (ref >60.00)
Glucose, Bld: 74 mg/dL (ref 70–99)
Potassium: 4 mEq/L (ref 3.5–5.1)
Sodium: 137 mEq/L (ref 135–145)
TOTAL PROTEIN: 7.1 g/dL (ref 6.0–8.3)

## 2017-12-05 LAB — TSH: TSH: 2.82 u[IU]/mL (ref 0.35–4.50)

## 2017-12-05 MED ORDER — LEVOTHYROXINE SODIUM 125 MCG PO TABS: 125.0000 ug | ORAL_TABLET | Freq: Every day | ORAL | 3 refills | Status: DC

## 2017-12-05 NOTE — Patient Instructions (Signed)
Try to quit smoking  Really need to quit alcohol- with binging can weaken the heart.

## 2017-12-05 NOTE — Progress Notes (Signed)
Subjective:     Patient ID: Adrian [name], male   DOB: 09-01-1955, 62 y.o.   MRN: [id number]  HPI Patient seen for medical follow-up. Poor compliance with follow-up. He has hypothyroidism treated with levothyroxine. He had thyroid test done last August which was stable. Compliant with therapy.  He has history of atrial fibrillation. He is currently on eliquis and also takes [employer].  He's had previous echocardiogram with ejection fraction 30-35%. He had cardiac cath which showed 20% RCA lesion. He unfortunately still smokes half pack cigarettes per day. Also frequently drinks up to 6 beers per day.  He was advised by cardiology previously discontinue and he does realize this could worsen his cardiomyopathy.  Works at [employer] and job is fairly physical involving lots of lifting he states he's had no recent dyspnea whatsoever. No performed edema issues. No chest pain  Past Medical History:  Diagnosis Date  . CAD (coronary artery disease), native coronary artery 12/06/2015   a. 10/2015 MV: EF  37%, reversible defect inferior apex, intermediate risk findings; b. 10/2015 Cath: 20% mid RCA.  Marland Kitchen History of chemotherapy 2005   Cisplatin  . Hypothyroidism   . NICM (nonischemic cardiomyopathy) ([place]) 10/14/2015   a. Tachy mediated?;  b. Echo 3/17 - Mild concentric LVH, EF 30-35%, anteroseptal, anterior, anterolateral, apical anterior, lateral hypokinesis, trivial MR, mild to moderately reduced RVSF; c. LHC 3/17 - mRCA 20%  . Paroxysmal atrial flutter ([place])    a. TEE 3/17 with ? LAA clot-->s/p TEE/DCCV 11/24/2015  . Radiation NOv.3,2005-Dec. 15, 2005   6810 cGy in 30 fractions  . 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 CARDIAC CATHETERIZATION N/A 10/18/2015   Procedure: Left Heart Cath and Coronary Angiography;  Surgeon: Burnell Blanks, MD;  Location: [place] CV LAB;  Service: Cardiovascular;  Laterality: N/A;  . CARDIOVERSION N/A 11/24/2015   Procedure:  CARDIOVERSION;  Surgeon: Josue Hector, MD;  Location: [facility] ENDOSCOPY;  Service: Cardiovascular;  Laterality: N/A;  . LAMINECTOMY     C5/placement of steel plate  . NECK SURGERY  2003   replaced disk  . TEE WITHOUT CARDIOVERSION N/A 10/15/2015   Procedure: TRANSESOPHAGEAL ECHOCARDIOGRAM (TEE);  Surgeon: Larey Dresser, MD;  Location: [place];  Service: Cardiovascular;  Laterality: N/A;  . TEE WITHOUT CARDIOVERSION N/A 11/24/2015   Procedure: TRANSESOPHAGEAL ECHOCARDIOGRAM (TEE);  Surgeon: Josue Hector, MD;  Location: [facility] ENDOSCOPY;  Service: Cardiovascular;  Laterality: N/A;    reports that he has been smoking cigarettes.  He has a 10.00 pack-year smoking history. He has never used smokeless tobacco. He reports that he drinks about 7.2 oz of alcohol per week. He reports that he does not use drugs. family history includes Cancer in his brother; Colon cancer in his father; Prostate cancer in his father; Skin cancer in his mother. Not on File   Review of Systems  Constitutional: Negative for fatigue.  Eyes: Negative for visual disturbance.  Respiratory: Negative for cough, chest tightness and shortness of breath.   Cardiovascular: Negative for chest pain, palpitations and leg swelling.  Neurological: Negative for dizziness, syncope, weakness, light-headedness and headaches.       Objective:   Physical Exam  Constitutional: He is oriented to person, place, and time. He appears well-developed and well-nourished.  HENT:  Right Ear: External ear normal.  Left Ear: External ear normal.  Mouth/Throat: Oropharynx is clear and moist.  Eyes: Pupils are equal, round, and reactive to light.  Neck: Neck supple. No thyromegaly present.  Cardiovascular: Normal rate and regular rhythm.  Pulmonary/Chest: Effort normal and breath sounds normal. No respiratory distress. He has no wheezes. He has no rales.  Musculoskeletal: He exhibits no edema.  Neurological: He is alert and oriented to person,  place, and time.       Assessment:     #1 hypothyroidism  #2 history of atrial fibrillation. Appears to be in sinus rhythm today. Past history of alcohol binging which may be contributing to increased risk  #3 history of minimal CAD by previous cardiac catheterization  #4 hyperlipidemia with initiation of Lipitor last summer and no follow-up since then  #5 systolic dysfunction- currently asymptomatic.    Plan:     -Strongly advised to stop smoking. Current motivation low -Also advised that he quit drinking completely though motivation is questionable -Obtain further labs with comprehensive metabolic panel, CBC, TSH, lipid panel -He is advised to schedule follow-up with cardiology soon  [name] MD [facility]

## 2017-12-06 LAB — PATHOLOGIST SMEAR REVIEW

## 2018-01-30 ENCOUNTER — Other Ambulatory Visit: Payer: Self-pay | Admitting: Physician Assistant

## 2018-01-30 NOTE — Telephone Encounter (Addendum)
Eliquis 5mg  refill request received; pt is 62 yrs old, wt-66kg, Crea-0.82 on 12/05/17, last seen by Estella Husk on 01/23/17 & has a recall in the system but no appt scheduled.  LMOVM for the pt to callback and schedule an appt for Cardiology as pt is overdue and was due 6 months after being seen in 01/2017.  02/18/18-refill was sent in on 02/08/18 by PharmD Georgina Peer for 3 month supply & a refill will deny this refill. Pt does not have an appt set at this time & has not called back to schedule.

## 2018-01-31 ENCOUNTER — Other Ambulatory Visit: Payer: Self-pay | Admitting: Physician Assistant

## 2018-02-08 ENCOUNTER — Other Ambulatory Visit: Payer: Self-pay | Admitting: *Deleted

## 2018-02-08 MED ORDER — APIXABAN 5 MG PO TABS: 5.0000 mg | ORAL_TABLET | Freq: Two times a day (BID) | ORAL | 1 refills | Status: DC

## 2018-02-22 ENCOUNTER — Other Ambulatory Visit: Payer: Self-pay | Admitting: Cardiology

## 2018-02-22 MED ORDER — NEBIVOLOL HCL 10 MG PO TABS: 10.0000 mg | ORAL_TABLET | Freq: Every day | ORAL | 0 refills | Status: DC

## 2018-02-22 NOTE — Telephone Encounter (Signed)
Pt's medication was sent to pt's pharmacy as requested. Confirmation received.  °

## 2018-02-22 NOTE — Telephone Encounter (Signed)
New Message    *STAT* If patient is at the pharmacy, call can be transferred to refill team.   1. Which medications need to be refilled? (please list name of each medication and dose if known) nebivolol (BYSTOLIC) 10 MG tablet  2. Which pharmacy/location (including street and city if local pharmacy) is medication to be sent to? [id number] - [place], [place].  3. Do they need a 30 day or 90 day supply? [place]

## 2018-03-01 ENCOUNTER — Other Ambulatory Visit: Payer: Self-pay | Admitting: Physician Assistant

## 2018-05-03 ENCOUNTER — Ambulatory Visit: Payer: BLUE CROSS/BLUE SHIELD | Admitting: Cardiology

## 2018-05-06 ENCOUNTER — Encounter: Payer: Self-pay | Admitting: Cardiology

## 2018-05-06 ENCOUNTER — Ambulatory Visit (INDEPENDENT_AMBULATORY_CARE_PROVIDER_SITE_OTHER): Payer: BLUE CROSS/BLUE SHIELD | Admitting: Cardiology

## 2018-05-06 ENCOUNTER — Encounter (INDEPENDENT_AMBULATORY_CARE_PROVIDER_SITE_OTHER): Payer: Self-pay

## 2018-05-06 VITALS — BP 134/84 | HR 58 | Ht 68.0 in | Wt 134.4 lb

## 2018-05-06 DIAGNOSIS — I2510 Atherosclerotic heart disease of native coronary artery without angina pectoris: Secondary | ICD-10-CM | POA: Diagnosis not present

## 2018-05-06 DIAGNOSIS — I4892 Unspecified atrial flutter: Secondary | ICD-10-CM | POA: Diagnosis not present

## 2018-05-06 DIAGNOSIS — I428 Other cardiomyopathies: Secondary | ICD-10-CM | POA: Diagnosis not present

## 2018-05-06 NOTE — Progress Notes (Signed)
Cardiology Office Note:    Date:  05/06/2018   ID:  Adrian [name], DOB 14-Sep-1955, MRN [id number]  PCP:  [name], MD  Cardiologist:  No primary care provider on file.    Referring MD: [name], MD   Chief Complaint  Patient presents with  . Atrial Flutter  . Cardiomyopathy    History of Present Illness:    Adrian [name] is a 62 y.o. male with a hx of atrial flutter in the setting of acute diverticulitis 10/2015. Also found to have a nonischemic cardiomyopathy LVEF 30-35%. TEE showed probable left atrial appendage clot. He had an abnormal stress test followed by cardiac catheterization which showed minimal nonobstructive RCA disease. He was placed on Eliquis and beta blocker. He underwent TEE guided cardioversion 11/24/2015. Patient was last seen by Jorja Loa, [facility] 12/2015 and was doing well. He was having a mild orthostasis but patient controlled. Patient was supposed to have follow-up 2-D echo in July 2017 Which showed normal LV EF 55-60% with no wall motion abnormalities.  Past Medical History:  Diagnosis Date  . CAD (coronary artery disease), native coronary artery 12/06/2015   a. 10/2015 MV: EF  37%, reversible defect inferior apex, intermediate risk findings; b. 10/2015 Cath: 20% mid RCA.  Marland Kitchen History of chemotherapy 2005   Cisplatin  . Hypothyroidism   . NICM (nonischemic cardiomyopathy) ([place]) 10/14/2015   a. Tachy mediated?;  b. Echo 3/17 - Mild concentric LVH, EF 30-35%, anteroseptal, anterior, anterolateral, apical anterior, lateral hypokinesis, trivial MR, mild to moderately reduced RVSF; c. LHC 3/17 - mRCA 20%  . Paroxysmal atrial flutter ([employer])    a. TEE 3/17 with ? LAA clot-->s/p TEE/DCCV 11/24/2015  . Radiation NOv.3,2005-Dec. 15, 2005   6810 cGy in 30 fractions  . 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 CARDIAC CATHETERIZATION N/A 10/18/2015   Procedure: Left Heart Cath and Coronary Angiography;  Surgeon:  Burnell Blanks, MD;  Location: [place] [facility];  Service: Cardiovascular;  Laterality: N/A;  . CARDIOVERSION N/A 11/24/2015   Procedure: CARDIOVERSION;  Surgeon: Josue Hector, MD;  Location: [facility] ENDOSCOPY;  Service: Cardiovascular;  Laterality: N/A;  . LAMINECTOMY     C5/placement of steel plate  . NECK SURGERY  2003   replaced disk  . TEE WITHOUT CARDIOVERSION N/A 10/15/2015   Procedure: TRANSESOPHAGEAL ECHOCARDIOGRAM (TEE);  Surgeon: Larey Dresser, MD;  Location: [place];  Service: Cardiovascular;  Laterality: N/A;  . TEE WITHOUT CARDIOVERSION N/A 11/24/2015   Procedure: TRANSESOPHAGEAL ECHOCARDIOGRAM (TEE);  Surgeon: Josue Hector, MD;  Location: [facility] ENDOSCOPY;  Service: Cardiovascular;  Laterality: N/A;    Current Medications: Current Meds  Medication Sig  . apixaban (ELIQUIS) 5 MG TABS tablet Take 1 tablet (5 mg total) by mouth 2 (two) times daily.  Marland Kitchen atorvastatin (LIPITOR) 40 MG tablet Take 1 tablet (40 mg total) by mouth daily. Please keep upcoming appt in September for future refills. Thank you  . levothyroxine (SYNTHROID, LEVOTHROID) 125 MCG tablet Take 1 tablet (125 mcg total) by mouth daily.  . nebivolol (BYSTOLIC) 10 MG tablet Take 1 tablet (10 mg total) by mouth daily. Please keep upcoming appt in September before anymore refills. Thank you     Allergies:   Patient has no allergy information on record.   Social History   Socioeconomic History  . Marital status: Married    Spouse name: Not on file  . Number of children:  Not on file  . Years of education: Not on file  . Highest education level: Not on file  Occupational History  . Not on file  Social Needs  . Financial resource strain: Not on file  . Food insecurity:    Worry: Not on file    Inability: Not on file  . Transportation needs:    Medical: Not on file    Non-medical: Not on file  Tobacco Use  . Smoking status: Current Every Day Smoker    Packs/day: 0.50    Years: 20.00    Pack years:  10.00    Types: Cigarettes  . Smokeless tobacco: Never Used  . Tobacco comment: 1/2 pack per day  Substance and Sexual Activity  . Alcohol use: Yes    Alcohol/week: 12.0 standard drinks    Types: 12 Cans of beer per week    Comment: couple of beers each day  . Drug use: No  . Sexual activity: Not on file  Lifestyle  . Physical activity:    Days per week: Not on file    Minutes per session: Not on file  . Stress: Not on file  Relationships  . Social connections:    Talks on phone: Not on file    Gets together: Not on file    Attends religious service: Not on file    Active member of club or organization: Not on file    Attends meetings of clubs or organizations: Not on file    Relationship status: Not on file  Other Topics Concern  . Not on file  Social History Narrative  . Not on file     Family History: The patient's family history includes Cancer in his brother; Colon cancer in his father; Prostate cancer in his father; Skin cancer in his mother. none ROS:   Please see the history of present illness.    ROS  All other systems reviewed and negative.   EKGs/Labs/Other Studies Reviewed:    The following studies were reviewed today:   EKG:  EKG is  ordered today.  The ekg ordered today demonstrates sinus bradycardia 58 bpm with no ST changes.  Recent Labs: 12/05/2017: ALT 23; BUN 8; Creatinine, Ser 0.82; Hemoglobin 14.9; Platelets 170.0; Potassium 4.0; Sodium 137; TSH 2.82   Recent Lipid Panel    Component Value Date/Time   CHOL 192 12/05/2017 0931   CHOL 159 03/09/2017 1032   TRIG 130.0 12/05/2017 0931   HDL 50.90 12/05/2017 0931   HDL 61 03/09/2017 1032   CHOLHDL 4 12/05/2017 0931   VLDL 26.0 12/05/2017 0931   LDLCALC 115 (H) 12/05/2017 0931   LDLCALC 66 03/09/2017 1032   LDLDIRECT 143.3 06/20/2012 1148    Physical Exam:    VS:  BP 134/84   Pulse (!) 58   Ht 5\' 8"  (1.727 m)   Wt 134 lb 6.4 oz (61 kg)   SpO2 97%   BMI 20.44 kg/m     Wt Readings  from Last 3 Encounters:  05/06/18 134 lb 6.4 oz (61 kg)  12/05/17 145 lb 9.6 oz (66 kg)  01/23/17 145 lb (65.8 kg)     GEN:  Well nourished, well developed in no acute distress HEENT: Normal NECK: No JVD; No carotid bruits LYMPHATICS: No lymphadenopathy CARDIAC: RRR, no murmurs, rubs, gallops RESPIRATORY:  Clear to auscultation without rales, wheezing or rhonchi  ABDOMEN: Soft, non-tender, non-distended MUSCULOSKELETAL:  No edema; No deformity  SKIN: Warm and dry NEUROLOGIC:  Alert and oriented  x 3 PSYCHIATRIC:  Normal affect   ASSESSMENT:    1. Atrial flutter, unspecified type ([place])   2. NICM (nonischemic cardiomyopathy) ([place])   3. Coronary artery disease involving native coronary artery of native heart without angina pectoris    PLAN:    In order of problems listed above:  1.  Paroxysmal atrial flutter -this was in the setting of acute diverticulitis with left atrial clot in March 2017 status post TEE cardioversion in April 2017.  He has not had any further episodes of atrial flutter.  He will continue on Eliquis 5 mg twice daily and Bystolic 10 mg daily.  His creatinine was 0.82 potassium 4 on 12/05/2017.  Hemoglobin was stable at 14.9.  2.  Nonischemic dilated cardiomyopathy -EF normalized to 55 to 60% on echo 02/2016.  3.  Nonobstructive ASCAD by cardiac cath 2017.  He denies any anginal symptoms.  He will continue on statin and Bystolic.  He is not on aspirin due to DOAC.   Medication Adjustments/Labs and Tests Ordered: Current medicines are reviewed at length with the patient today.  Concerns regarding medicines are outlined above.  No orders of the defined types were placed in this encounter.  No orders of the defined types were placed in this encounter.   Signed, Fransico Him, MD  05/06/2018 11:07 AM    [place]

## 2018-05-06 NOTE — Patient Instructions (Signed)
Medication Instructions:  Your physician recommends that you continue on your current medications as directed. Please refer to the Current Medication list given to you today.  Follow-Up: Your physician wants you to follow-up in: 6 month with PA. You will receive a reminder letter in the mail two months in advance. If you don't receive a letter, please call our office to schedule the follow-up appointment.  Your physician wants you to follow-up in: 1 year with Dr. [name]. You will receive a reminder letter in the mail two months in advance. If you don't receive a letter, please call our office to schedule the follow-up appointment.  If you need a refill on your cardiac medications before your next appointment, please call your pharmacy.

## 2018-05-21 ENCOUNTER — Other Ambulatory Visit: Payer: Self-pay | Admitting: Cardiology

## 2018-09-23 ENCOUNTER — Other Ambulatory Visit: Payer: Self-pay

## 2018-09-23 ENCOUNTER — Ambulatory Visit: Payer: Self-pay

## 2018-09-23 ENCOUNTER — Inpatient Hospital Stay (HOSPITAL_COMMUNITY)
Admission: EM | Admit: 2018-09-23 | Discharge: 2018-09-26 | DRG: 392 | Disposition: A | Discharge status: Home / Self Care | Payer: BLUE CROSS/BLUE SHIELD | Attending: Family Medicine | Admitting: Family Medicine

## 2018-09-23 ENCOUNTER — Emergency Department (HOSPITAL_COMMUNITY): Payer: BLUE CROSS/BLUE SHIELD

## 2018-09-23 ENCOUNTER — Encounter (HOSPITAL_COMMUNITY): Payer: Self-pay | Admitting: Radiology

## 2018-09-23 DIAGNOSIS — I428 Other cardiomyopathies: Secondary | ICD-10-CM | POA: Diagnosis present

## 2018-09-23 DIAGNOSIS — Z85818 Personal history of malignant neoplasm of other sites of lip, oral cavity, and pharynx: Secondary | ICD-10-CM

## 2018-09-23 DIAGNOSIS — K5792 Diverticulitis of intestine, part unspecified, without perforation or abscess without bleeding: Secondary | ICD-10-CM | POA: Diagnosis present

## 2018-09-23 DIAGNOSIS — K5900 Constipation, unspecified: Secondary | ICD-10-CM | POA: Diagnosis present

## 2018-09-23 DIAGNOSIS — I4892 Unspecified atrial flutter: Secondary | ICD-10-CM | POA: Diagnosis present

## 2018-09-23 DIAGNOSIS — Z808 Family history of malignant neoplasm of other organs or systems: Secondary | ICD-10-CM

## 2018-09-23 DIAGNOSIS — I2510 Atherosclerotic heart disease of native coronary artery without angina pectoris: Secondary | ICD-10-CM | POA: Diagnosis present

## 2018-09-23 DIAGNOSIS — I4891 Unspecified atrial fibrillation: Secondary | ICD-10-CM | POA: Diagnosis not present

## 2018-09-23 DIAGNOSIS — Z8042 Family history of malignant neoplasm of prostate: Secondary | ICD-10-CM | POA: Diagnosis not present

## 2018-09-23 DIAGNOSIS — Z800 Family history of malignant neoplasm of digestive organs: Secondary | ICD-10-CM

## 2018-09-23 DIAGNOSIS — Z8719 Personal history of other diseases of the digestive system: Secondary | ICD-10-CM | POA: Diagnosis not present

## 2018-09-23 DIAGNOSIS — Z7901 Long term (current) use of anticoagulants: Secondary | ICD-10-CM

## 2018-09-23 DIAGNOSIS — Z923 Personal history of irradiation: Secondary | ICD-10-CM | POA: Diagnosis not present

## 2018-09-23 DIAGNOSIS — K5720 Diverticulitis of large intestine with perforation and abscess without bleeding: Secondary | ICD-10-CM | POA: Diagnosis not present

## 2018-09-23 DIAGNOSIS — E039 Hypothyroidism, unspecified: Secondary | ICD-10-CM | POA: Diagnosis present

## 2018-09-23 DIAGNOSIS — I480 Paroxysmal atrial fibrillation: Secondary | ICD-10-CM | POA: Diagnosis present

## 2018-09-23 DIAGNOSIS — F17210 Nicotine dependence, cigarettes, uncomplicated: Secondary | ICD-10-CM | POA: Diagnosis present

## 2018-09-23 DIAGNOSIS — Z9221 Personal history of antineoplastic chemotherapy: Secondary | ICD-10-CM | POA: Diagnosis not present

## 2018-09-23 LAB — CBC
HCT: 46.1 % (ref 39.0–52.0)
HCT: 42 % (ref 39.0–52.0)
Hemoglobin: 15.2 g/dL (ref 13.0–17.0)
Hemoglobin: 14.2 g/dL (ref 13.0–17.0)
MCH: 31.1 pg (ref 26.0–34.0)
MCH: 31.2 pg (ref 26.0–34.0)
MCHC: 33 g/dL (ref 30.0–36.0)
MCHC: 33.8 g/dL (ref 30.0–36.0)
MCV: 94.3 fL (ref 80.0–100.0)
MCV: 92.3 fL (ref 80.0–100.0)
Platelets: 166 10*3/uL (ref 150–400)
Platelets: 158 10*3/uL (ref 150–400)
RBC: 4.89 MIL/uL (ref 4.22–5.81)
RBC: 4.55 MIL/uL (ref 4.22–5.81)
RDW: 13.2 % (ref 11.5–15.5)
RDW: 13.1 % (ref 11.5–15.5)
WBC: 5.4 10*3/uL (ref 4.0–10.5)
WBC: 4.9 10*3/uL (ref 4.0–10.5)
nRBC: 0 % (ref 0.0–0.2)
nRBC: 0 % (ref 0.0–0.2)

## 2018-09-23 LAB — COMPREHENSIVE METABOLIC PANEL
ALK PHOS: 61 U/L (ref 38–126)
ALT: 12 U/L (ref 0–44)
AST: 18 U/L (ref 15–41)
Albumin: 3.3 g/dL — ABNORMAL LOW (ref 3.5–5.0)
Anion gap: 11 (ref 5–15)
BUN: 10 mg/dL (ref 8–23)
CALCIUM: 8.7 mg/dL — AB (ref 8.9–10.3)
CO2: 29 mmol/L (ref 22–32)
Chloride: 95 mmol/L — ABNORMAL LOW (ref 98–111)
Creatinine, Ser: 0.99 mg/dL (ref 0.61–1.24)
GFR calc Af Amer: >60 mL/min (ref >60)
GFR calc non Af Amer: >60 mL/min (ref >60)
Glucose, Bld: 110 mg/dL — ABNORMAL HIGH (ref 70–99)
Potassium: 3.8 mmol/L (ref 3.5–5.1)
Sodium: 135 mmol/L (ref 135–145)
Total Bilirubin: 0.7 mg/dL (ref 0.3–1.2)
Total Protein: 7.3 g/dL (ref 6.5–8.1)

## 2018-09-23 LAB — CREATININE, SERUM
Creatinine, Ser: 0.86 mg/dL (ref 0.61–1.24)
GFR calc Af Amer: >60 mL/min (ref >60)
GFR calc non Af Amer: >60 mL/min (ref >60)

## 2018-09-23 LAB — URINALYSIS, ROUTINE W REFLEX MICROSCOPIC
Bacteria, UA: NONE SEEN
Bilirubin Urine: NEGATIVE
Glucose, UA: NEGATIVE mg/dL
Ketones, ur: 80 mg/dL — AB
Leukocytes,Ua: NEGATIVE
Nitrite: NEGATIVE
Protein, ur: 30 mg/dL — AB
SPECIFIC GRAVITY, URINE: 1.034 — AB (ref 1.005–1.030)
pH: 8 (ref 5.0–8.0)

## 2018-09-23 LAB — LIPASE, BLOOD: Lipase: 19 U/L (ref 11–51)

## 2018-09-23 MED ORDER — PIPERACILLIN-TAZOBACTAM 3.375 G IVPB 30 MIN
3.3750 g | Freq: Once | INTRAVENOUS | Status: AC
  Administered 2018-09-23: 3.375 g via INTRAVENOUS
  Filled 2018-09-23: qty 50

## 2018-09-23 MED ORDER — ONDANSETRON HCL 4 MG/2ML IJ SOLN: 4.0000 mg | Freq: Four times a day (QID) | INTRAMUSCULAR | Status: DC | PRN

## 2018-09-23 MED ORDER — SODIUM CHLORIDE 0.9% FLUSH: 3.0000 mL | Freq: Once | INTRAVENOUS | Status: DC

## 2018-09-23 MED ORDER — ONDANSETRON HCL 4 MG PO TABS: 4.0000 mg | ORAL_TABLET | Freq: Four times a day (QID) | ORAL | Status: DC | PRN

## 2018-09-23 MED ORDER — PIPERACILLIN-TAZOBACTAM 3.375 G IVPB
3.3750 g | Freq: Three times a day (TID) | INTRAVENOUS | Status: DC
  Administered 2018-09-23 – 2018-09-26 (×8): 3.375 g via INTRAVENOUS
  Filled 2018-09-23 (×8): qty 50

## 2018-09-23 MED ORDER — LEVOTHYROXINE SODIUM 25 MCG PO TABS
125.0000 ug | ORAL_TABLET | Freq: Every day | ORAL | Status: DC
  Administered 2018-09-24 – 2018-09-26 (×3): 125 ug via ORAL
  Filled 2018-09-23 (×4): qty 1

## 2018-09-23 MED ORDER — MORPHINE SULFATE (PF) 2 MG/ML IV SOLN
2.0000 mg | INTRAVENOUS | Status: DC | PRN
  Administered 2018-09-23: 2 mg via INTRAVENOUS
  Administered 2018-09-23: 4 mg via INTRAVENOUS
  Filled 2018-09-23: qty 2
  Filled 2018-09-23: qty 1

## 2018-09-23 MED ORDER — ONDANSETRON HCL 4 MG/2ML IJ SOLN
4.0000 mg | Freq: Four times a day (QID) | INTRAMUSCULAR | Status: DC | PRN
  Administered 2018-09-23: 4 mg via INTRAVENOUS
  Filled 2018-09-23: qty 2

## 2018-09-23 MED ORDER — MORPHINE SULFATE (PF) 2 MG/ML IV SOLN
2.0000 mg | INTRAVENOUS | Status: DC | PRN
  Administered 2018-09-23 – 2018-09-24 (×2): 2 mg via INTRAVENOUS
  Filled 2018-09-23 (×2): qty 1

## 2018-09-23 MED ORDER — ENOXAPARIN SODIUM 40 MG/0.4ML ~~LOC~~ SOLN
40.0000 mg | SUBCUTANEOUS | Status: DC
  Administered 2018-09-23: 40 mg via SUBCUTANEOUS
  Filled 2018-09-23: qty 0.4

## 2018-09-23 MED ORDER — NEBIVOLOL HCL 10 MG PO TABS
10.0000 mg | ORAL_TABLET | Freq: Every day | ORAL | Status: DC
  Administered 2018-09-24 – 2018-09-26 (×2): 10 mg via ORAL
  Filled 2018-09-23 (×3): qty 1

## 2018-09-23 MED ORDER — SODIUM CHLORIDE 0.9 % IV BOLUS
1000.0000 mL | Freq: Once | INTRAVENOUS | Status: AC
  Administered 2018-09-23: 1000 mL via INTRAVENOUS

## 2018-09-23 MED ORDER — POTASSIUM CHLORIDE IN NACL 20-0.9 MEQ/L-% IV SOLN
INTRAVENOUS | Status: DC
  Administered 2018-09-23 – 2018-09-26 (×7): via INTRAVENOUS
  Filled 2018-09-23 (×9): qty 1000

## 2018-09-23 MED ORDER — IOHEXOL 300 MG/ML  SOLN
100.0000 mL | Freq: Once | INTRAMUSCULAR | Status: AC | PRN
  Administered 2018-09-23: 100 mL via INTRAVENOUS

## 2018-09-23 NOTE — Telephone Encounter (Signed)
Incoming  Call from  Patient  With  Complaint of  Lower central  Abdominal  Pain.  Occasionally  Radiating to  The  Back.  Onset was  Tuesday  Orf  Wednesday  Last  Week.  Rates  The  Pain  As  Severe that  Comes  And  Goes.  Has tried  Antacids with  Some  Relief.  Patient  States  He  Is  Constipated  And  Urine  Out  Has  Decreased with  Less  Power stream.  Per  Protocol  Recommended that  Patient  Got to  ED  For  Evaluation.  Patient  Did not  Wish  To  Go to ED .  Recommended Urgent  Care.  Patient  States that  He  Will go to  Urgent  Care  On  [employer].    Reason for Disposition . [1] SEVERE pain AND [2] age > 46  Answer Assessment - Initial Assessment Questions 1. LOCATION: "Where does it hurt?"      Lower abd pain  Central  liked 2. RADIATION: "Does the pain shoot anywhere else?" (e.g., chest, back)     Lower  back 3. ONSET: "When did the pain begin?" (Minutes, hours or days ago)      Tuesday  or Wednesday  Last  week 4. SUDDEN: "Gradual or sudden onset?"       gradual 5. PATTERN "Does the pain come and go, or is it constant?"    - If constant: "Is it getting better, staying the same, or worsening?"      (Note: Constant means the pain never goes away completely; most serious pain is constant and it progresses)     - If intermittent: "How long does it last?" "Do you have pain now?"     (Note: Intermittent means the pain goes away completely between bouts)     Severe pain  Comes  And  goes 6. SEVERITY: "How bad is the pain?"  (e.g., Scale 1-10; mild, moderate, or severe)    - MILD (1-3): doesn't interfere with normal activities, abdomen soft and not tender to touch     - MODERATE (4-7): interferes with normal activities or awakens from sleep, tender to touch     - SEVERE (8-10): excruciating pain, doubled over, unable to do any normal activities       severe 7. RECURRENT SYMPTOM: "Have you ever had this type of abdominal pain before?" If so, ask: "When was the last time?" and  "What happened that time?"     no 8. CAUSE: "What do you think is causing the abdominal pain?"      no 9. RELIEVING/AGGRAVATING FACTORS: "What makes it better or worse?" (e.g., movement, antacids, bowel movement)     antacids10. OTHER SYMPTOMS: "Has there been any vomiting, diarrhea, constipation, or urine problems?"       Constipation unable to have  A  stonge n stream  Protocols used: ABDOMINAL PAIN - MALE-A-AH

## 2018-09-23 NOTE — ED Triage Notes (Signed)
Pt reports lower abdominal pain and constipation with minimal urine output. Pt also reports chills. Pt reports symptoms for several days.

## 2018-09-23 NOTE — Telephone Encounter (Signed)
It sounds from note that he decided to go to Urgent Care?  He definitely needs to be evaluated urgently based on symptoms.

## 2018-09-23 NOTE — H&P (Signed)
History and Physical:    Adrian [name]   NMM:[id number] DOB: [birth year] DOA: 09/23/2018  Referring MD/provider: Dr Billy Fischer PCP: [name], MD   Patient coming from: Home  Chief Complaint: Abdominal pain  History of Present Illness:   Adrian [name] is an 63 y.o. male with past medical history significant for atrial fibrillation, coronary artery disease and previous history of diverticulitis who was in his usual state of good health until 5-6 days prior to admission when he noted onset of lower abdominal pain. Patient also had nonbloody diarrhea during this time and attributed the pain to the diarrhea. He continued to go to work. Patient did note worsening abdominal pain over the past 5 days and although he did work yesterday he said he was unable to sleep for the past 2 nights secondary to pain. Patient now presents for evaluation.  Patient denies frank fevers however does admit to feeling "cold all day today I don't get it". She does admit to nausea but denies any vomiting. He does have diarrhea 2-3 times a day as noted previously. No chest pain shortness of breath or cough. No dizziness syncope or presyncope.   ED Course:  The patient underwent a CT scan which showed acute sigmoid diverticulitis with an intramural abscess of 2.6 cm without free air. Patient was seen by general surgery and started on Zosyn. He is now admitted [facility] for medical management.  ROS:   ROS   Review of Systems: General: No fever, chills, weight changes Skin: No rashes, lesions, wounds Eyes: no discharge, redness, pain HENT: no ear pain, hearing loss, drainage, tinnitus Endocrine: no heat/cold intolerance, no polyuria Respiratory: no cough, shortness of breath, hemoptysis Cardiovascular: No palpitations, chest pain GU: No dysuria, increased frequency CNS: No numbness, dizziness, headache Musculoskeletal: No back pain, joint pain Blood/lymphatics: No easy bruising,  bleeding Mood/affect: No anxiety/depression    Past Medical History:   Past Medical History:  Diagnosis Date  . CAD (coronary artery disease), native coronary artery 12/06/2015   a. 10/2015 MV: EF  37%, reversible defect inferior apex, intermediate risk findings; b. 10/2015 Cath: 20% mid RCA.  Marland Kitchen History of chemotherapy 2005   Cisplatin  . Hypothyroidism   . NICM (nonischemic cardiomyopathy) ([place]) 10/14/2015   a. Tachy mediated?;  b. Echo 3/17 - Mild concentric LVH, EF 30-35%, anteroseptal, anterior, anterolateral, apical anterior, lateral hypokinesis, trivial MR, mild to moderately reduced RVSF; c. LHC 3/17 - mRCA 20%  . Paroxysmal atrial flutter ([place])    a. TEE 3/17 with ? LAA clot-->s/p TEE/DCCV 11/24/2015  . Radiation NOv.3,2005-Dec. 15, 2005   6810 cGy in 30 fractions  . Tonsil cancer ([place]) 2005    Past Surgical History:   Past Surgical History:  Procedure Laterality Date  . CARDIAC CATHETERIZATION N/A 10/18/2015   Procedure: Left Heart Cath and Coronary Angiography;  Surgeon: Burnell Blanks, MD;  Location: [facility];  Service: Cardiovascular;  Laterality: N/A;  . CARDIOVERSION N/A 11/24/2015   Procedure: CARDIOVERSION;  Surgeon: Josue Hector, MD;  Location: [facility] ENDOSCOPY;  Service: Cardiovascular;  Laterality: N/A;  . LAMINECTOMY     C5/placement of steel plate  . NECK SURGERY  2003   replaced disk  . TEE WITHOUT CARDIOVERSION N/A 10/15/2015   Procedure: TRANSESOPHAGEAL ECHOCARDIOGRAM (TEE);  Surgeon: Larey Dresser, MD;  Location: [facility];  Service: Cardiovascular;  Laterality: N/A;  . TEE WITHOUT CARDIOVERSION N/A 11/24/2015   Procedure: TRANSESOPHAGEAL ECHOCARDIOGRAM (TEE);  Surgeon:  Josue Hector, MD;  Location[facility];  Service: Cardiovascular;  Laterality: N/A;    Social History:   Social History   Socioeconomic History  . Marital status: Married    Spouse name: Not on file  . Number of children: Not on file  . Years of education: Not on  file  . Highest education level: Not on file  Occupational History  . Not on file  Social Needs  . Financial resource strain: Not on file  . Food insecurity:    Worry: Not on file    Inability: Not on file  . Transportation needs:    Medical: Not on file    Non-medical: Not on file  Tobacco Use  . Smoking status: Current Every Day Smoker    Packs/day: 0.50    Years: 20.00    Pack years: 10.00    Types: Cigarettes  . Smokeless tobacco: Never Used  . Tobacco comment: 1/2 pack per day  Substance and Sexual Activity  . Alcohol use: Yes    Alcohol/week: 12.0 standard drinks    Types: 12 Cans of beer per week    Comment: couple of beers each day  . Drug use: No  . Sexual activity: Not on file  Lifestyle  . Physical activity:    Days per week: Not on file    Minutes per session: Not on file  . Stress: Not on file  Relationships  . Social connections:    Talks on phone: Not on file    Gets together: Not on file    Attends religious service: Not on file    Active member of club or organization: Not on file    Attends meetings of clubs or organizations: Not on file    Relationship status: Not on file  . Intimate partner violence:    Fear of current or ex partner: Not on file    Emotionally abused: Not on file    Physically abused: Not on file    Forced sexual activity: Not on file  Other Topics Concern  . Not on file  Social History Narrative  . Not on file    Allergies   Patient has no known allergies.  Family history:   Family History  Problem Relation Age of Onset  . Prostate cancer Father   . Colon cancer Father   . Skin cancer Mother   . Cancer Brother     Current Medications:   Prior to Admission medications   Medication Sig Start Date End Date Taking? Authorizing Provider  apixaban (ELIQUIS) 5 MG TABS tablet Take 1 tablet (5 mg total) by mouth 2 (two) times daily. 02/08/18  Yes Turner, Eber Hong, MD  atorvastatin (LIPITOR) 40 MG tablet Take 1 tablet (40  mg total) by mouth daily. Please keep upcoming appt in September for future refills. Thank you Patient taking differently: Take 40 mg by mouth daily.  03/01/18  Yes Imogene Burn, PA-C  levothyroxine (SYNTHROID, LEVOTHROID) 125 MCG tablet Take 1 tablet (125 mcg total) by mouth daily. 12/05/17  Yes Burchette, Alinda Sierras, MD  nebivolol (BYSTOLIC) 10 MG tablet Take 1 tablet (10 mg total) by mouth daily. 05/21/18  Yes Turner, Eber Hong, MD  OVER THE COUNTER MEDICATION Take 1 tablet by mouth as needed (for cold/pain). Alka seltzer   Yes [provider]    Physical Exam:   Vitals:   09/23/18 1332 09/23/18 1345 09/23/18 1400 09/23/18 1430  BP: (!) 158/72 (!) 150/72 (!) 164/88 Marland Kitchen)  143/106  Pulse: 93 89 91 88  Resp:      Temp:      TempSrc:      SpO2: 97% 97% 97% 95%     Physical Exam: Blood pressure (!) 143/106, pulse 88, temperature (!) 97.5 F (36.4 C), temperature source Oral, resp. rate 18, SpO2 95 %. Gen: relatively well-appearing man with ready complexion lying flat in bed in no acute distress. Eyes: Sclerae anicteric. Conjunctiva mildly injected. Neck: Supple, no jugular venous distention. Chest: Moderately good air entry bilaterally with no adventitious sounds.  CV: Distant, regular, no audible murmurs. Abdomen:patient does have bowel sounds which are normoactive. His abdomen is soft and nondistended. He does have moderate to severe tenderness to light palpation in his suprapubic and left lower quadrant area.he does have some focal rebound at that area as well. Extremities: No edema.  Skin: Warm and dry. No rashes, lesions or wounds. Neuro: Alert and oriented times 3; grossly nonfocal. Psych: Patient is cooperative, logical and coherent with appropriate mood and affect.  Data Review:    Labs: Basic Metabolic Panel: Recent Labs  Lab 09/23/18 0948  NA 135  K 3.8  CL 95*  CO2 29  GLUCOSE 110*  BUN 10  CREATININE 0.99  CALCIUM 8.7*   Liver Function Tests: Recent  Labs  Lab 09/23/18 0948  AST 18  ALT 12  ALKPHOS 61  BILITOT 0.7  PROT 7.3  ALBUMIN 3.3*   Recent Labs  Lab 09/23/18 0948  LIPASE 19   No results for input(s): AMMONIA in the last 168 hours. CBC: Recent Labs  Lab 09/23/18 0948  WBC 5.4  HGB 15.2  HCT 46.1  MCV 94.3  PLT 166   Cardiac Enzymes: No results for input(s): CKTOTAL, CKMB, CKMBINDEX, TROPONINI in the last 168 hours.  BNP (last 3 results) No results for input(s): PROBNP in the last 8760 hours. CBG: No results for input(s): GLUCAP in the last 168 hours.  Urinalysis    Component Value Date/Time   COLORURINE YELLOW 09/23/2018 1324   APPEARANCEUR CLEAR 09/23/2018 1324   LABSPEC 1.034 (H) 09/23/2018 1324   PHURINE 8.0 09/23/2018 1324   GLUCOSEU NEGATIVE 09/23/2018 1324   HGBUR SMALL (A) 09/23/2018 1324   BILIRUBINUR NEGATIVE 09/23/2018 1324   BILIRUBINUR small (A) 10/12/2015 1003   BILIRUBINUR 2+ 06/19/2014 0949   KETONESUR 80 (A) 09/23/2018 1324   PROTEINUR 30 (A) 09/23/2018 1324   UROBILINOGEN 0.2 10/12/2015 1003   NITRITE NEGATIVE 09/23/2018 1324   LEUKOCYTESUR NEGATIVE 09/23/2018 1324      Radiographic Studies: Ct Abdomen Pelvis W Contrast  Result Date: 09/23/2018 CLINICAL DATA:  Pt c/o extreme pain in lower abdomin and not being able to urinate x 1 week EXAM: CT ABDOMEN AND PELVIS WITH CONTRAST TECHNIQUE: Multidetector CT imaging of the abdomen and pelvis was performed using the standard protocol following bolus administration of intravenous contrast. CONTRAST:  157mL OMNIPAQUE IOHEXOL 300 MG/ML  SOLN COMPARISON:  10/12/2015. FINDINGS: Lower chest: Stable benign 4 mm left lower lobe nodule, unchanged from prior CT. No acute findings. No new lung base nodules. Hepatobiliary: Subcentimeter low-density lesion, segment 2 tiny low-density segment 6. Low-density 4-5 mm lesion seen in segment 7 on the prior CT is not visualized. Current visualized lesions are too small to characterize but cysts are likely.  No other liver abnormality. Normal gallbladder. No bile duct dilation. Pancreas: Unremarkable. No pancreatic ductal dilatation or surrounding inflammatory changes. Spleen: Normal in size without focal abnormality. Adrenals/Urinary Tract: No adrenal masses.  Kidneys normal in size, orientation and position. No convincing renal stones. No masses. No hydronephrosis. Normal ureters. Bladder is unremarkable. Stomach/Bowel: There is irregular thickening and adjacent inflammation along the mid to lower sigmoid colon, with there are several diverticula, consistent with acute diverticulitis. There is an apparent intramural fluid collection containing nondependent air, measuring 2.6 x 1.5 x 2.1 cm. No extraluminal air. Inflammatory changes abut the posterosuperior bladder. Remainder of the colon is normal in caliber. No additional wall thickening or inflammation. Stomach is unremarkable. Small bowel is normal in caliber with no wall thickening or inflammation. Normal appendix visualized. Vascular/Lymphatic: Mild aortic atherosclerosis. No enlarged lymph nodes. Reproductive: Mild prostate enlargement, 4.3 cm in greatest transverse dimension, stable from the prior CT. Other: No ascites. No free intraperitoneal air. No abdominal wall hernia. Musculoskeletal: No fracture or acute finding. No osteoblastic or osteolytic lesions. IMPRESSION: 1. Acute sigmoid diverticulitis associated with an intramural abscess, 2.6 cm in greatest dimension. No extraluminal or free air. This portion of the colon was involved by diverticulitis on the prior CT. 2. No other acute abnormality within the abdomen or pelvis. 3. Mild aortic atherosclerosis. Electronically Signed   By: Lajean Manes M.D.   On: 09/23/2018 13:21    EKG: Independently reviewed. Sinus rhythm at 80. Normal intervals. Left axis deviation at -40. Increased voltage in V3 V4 of uncertain significance.   Assessment/Plan:   Principal Problem:   Diverticulitis Active  Problems:   Hypothyroidism   Atrial flutter (HCC)   CAD (coronary artery disease), native coronary artery   Diverticulitis of large intestine with abscess   DIVERTICULITIS Patient with 2.3 cm abscess and sigmoid diverticulitis without free air Patient is hemodynamically stable Will treat conservatively with Zosyn and IV fluids and pain management per surgery recommendations No indication for IR drainage at present per surgery  ATRIAL FIBRILLATION/FLUTTER Continue Bystolic or rate control if patient were to revert to A. Fib Will hold Eliquis for now without heparin bridge given perforation and low risk of CVA in the next couple of days Can start heparin bridge if oriented in the morning  CAD Hold aspirin as patient may need surgery Continue beta blocker eye stomach, hold atorvastatin for now  HYPOTHYROIDISM Continue Synthroid    Other information:   DVT prophylaxis: Lovenox ordered. Code Status: Full code. Family Communication:  Disposition Plan: home Consults called: general surgery Admission status: inpatient  The medical decision making is of moderate complexity, therefore this is a level 2 visit.  Dewaine Oats Tublu Chatterjee [facility]  If 7PM-7AM, please contact night-coverage [email or web address] Password [facility] 09/23/2018, 2:36 PM

## 2018-09-23 NOTE — Progress Notes (Signed)
Pharmacy Antibiotic Note  Adrian [name] is a 63 y.o. male admitted on 09/23/2018 with intra-abdominal infection.  Pharmacy has been consulted for Zosyn dosing.  Plan: Start Zosyn 3.375 gm IV q8h (4 hour infusion) Monitor clinical picture, renal function F/U C&S, abx deescalation / LOT     Temp (24hrs), Avg:97.5 F (36.4 C), Min:97.5 F (36.4 C), Max:97.5 F (36.4 C)  Recent Labs  Lab 09/23/18 0948  WBC 5.4  CREATININE 0.99    CrCl cannot be calculated (Unknown ideal weight.).    No Known Allergies  Thank you for allowing pharmacy to be a part of this patient's care.  Adrian [name] 09/23/2018 3:07 PM

## 2018-09-23 NOTE — ED Provider Notes (Signed)
[facility] EMERGENCY DEPARTMENT Provider Note   CSN: [id number] Arrival date & time: 09/23/18  8786     History   Chief Complaint Chief Complaint  Patient presents with  . Abdominal Pain    HPI Adrian [name] is a 63 y.o. male.  HPI   Presents with lower abdominal pain, constipation, difficulty urinating, Started Tuesday-Wednesday last week, and has been worsening, don't want to eat, cannot sleep, then got up and tried to urinate and had BM Feels like cramps, bloating, soreness, but sometimes will have episodes of more severe pain Chills, fatigue Mild nausea, no vomiting Cold all winter, coughing up white mucus, comes and goes, then it went away, no current cough  Diarrhea Tues or Wednesday then has had very little Passing some gas but little  Reports urinating but not able to empty, feels like still needs to urinate  Past Medical History:  Diagnosis Date  . CAD (coronary artery disease), native coronary artery 12/06/2015   a. 10/2015 MV: EF  37%, reversible defect inferior apex, intermediate risk findings; b. 10/2015 Cath: 20% mid RCA.  Marland Kitchen History of chemotherapy 2005   Cisplatin  . Hypothyroidism   . NICM (nonischemic cardiomyopathy) ([place]) 10/14/2015   a. Tachy mediated?;  b. Echo 3/17 - Mild concentric LVH, EF 30-35%, anteroseptal, anterior, anterolateral, apical anterior, lateral hypokinesis, trivial MR, mild to moderately reduced RVSF; c. LHC 3/17 - mRCA 20%  . Paroxysmal atrial flutter ([place])    a. TEE 3/17 with ? LAA clot-->s/p TEE/DCCV 11/24/2015  . Radiation NOv.3,2005-Dec. 15, 2005   6810 cGy in 30 fractions  . Tonsil cancer [facility]) 2005    Patient Active Problem List   Diagnosis Date Noted  . Diverticulitis of large intestine with abscess 09/23/2018  . Dyslipidemia 12/05/2017  . CAD (coronary artery disease), native coronary artery 12/06/2015  . SOB (shortness of breath)   . NICM (nonischemic cardiomyopathy) ([place])   . Diverticulitis of intestine  without perforation or abscess without bleeding   . Thyroid activity decreased   . Atrial flutter ([place]) 10/12/2015  . Diverticulitis 10/12/2015  . Hypothyroidism 08/12/2013  . History of radiation therapy 05/30/2012  . Smokes tobacco daily 05/30/2012  . Cancer of tonsillar fossa ([place]) 11/27/2011    Past Surgical History:  Procedure Laterality Date  . CARDIAC CATHETERIZATION N/A 10/18/2015   Procedure: Left Heart Cath and Coronary Angiography;  Surgeon: Burnell Blanks, MD;  Location: [place] CV LAB;  Service: Cardiovascular;  Laterality: N/A;  . CARDIOVERSION N/A 11/24/2015   Procedure: CARDIOVERSION;  Surgeon: Josue Hector, MD;  Location: [facility] ENDOSCOPY;  Service: Cardiovascular;  Laterality: N/A;  . LAMINECTOMY     C5/placement of steel plate  . NECK SURGERY  2003   replaced disk  . TEE WITHOUT CARDIOVERSION N/A 10/15/2015   Procedure: TRANSESOPHAGEAL ECHOCARDIOGRAM (TEE);  Surgeon: Larey Dresser, MD;  Location: [facility];  Service: Cardiovascular;  Laterality: N/A;  . TEE WITHOUT CARDIOVERSION N/A 11/24/2015   Procedure: TRANSESOPHAGEAL ECHOCARDIOGRAM (TEE);  Surgeon: Josue Hector, MD;  Location: [facility] ENDOSCOPY;  Service: Cardiovascular;  Laterality: N/A;        Home Medications    Prior to Admission medications   Medication Sig Start Date End Date Taking? Authorizing Provider  apixaban (ELIQUIS) 5 MG TABS tablet Take 1 tablet (5 mg total) by mouth 2 (two) times daily. 02/08/18  Yes Turner, Eber Hong, MD  atorvastatin (LIPITOR) 40 MG tablet Take 1 tablet (40 mg total) by mouth daily.  Please keep upcoming appt in September for future refills. Thank you Patient taking differently: Take 40 mg by mouth daily.  03/01/18  Yes Imogene Burn, PA-C  levothyroxine (SYNTHROID, LEVOTHROID) 125 MCG tablet Take 1 tablet (125 mcg total) by mouth daily. 12/05/17  Yes Burchette, Alinda Sierras, MD  nebivolol (BYSTOLIC) 10 MG tablet Take 1 tablet (10 mg total) by mouth daily. 05/21/18  Yes  Turner, Eber Hong, MD  OVER THE COUNTER MEDICATION Take 1 tablet by mouth as needed (for cold/pain). Alka seltzer   Yes [provider]    Family History Family History  Problem Relation Age of Onset  . Prostate cancer Father   . Colon cancer Father   . Skin cancer Mother   . Cancer Brother     Social History Social History   Tobacco Use  . Smoking status: Current Every Day Smoker    Packs/day: 0.50    Years: 20.00    Pack years: 10.00    Types: Cigarettes  . Smokeless tobacco: Never Used  . Tobacco comment: 1/2 pack per day  Substance Use Topics  . Alcohol use: Yes    Alcohol/week: 12.0 standard drinks    Types: 12 Cans of beer per week    Comment: couple of beers each day  . Drug use: No     Allergies   Patient has no known allergies.   Review of Systems Review of Systems  Constitutional: Negative for fever.  HENT: Negative for sore throat.   Eyes: Negative for visual disturbance.  Respiratory: Negative for shortness of breath.   Cardiovascular: Negative for chest pain.  Gastrointestinal: Positive for abdominal pain, constipation and nausea. Negative for diarrhea and vomiting.  Genitourinary: Positive for difficulty urinating and dysuria.  Musculoskeletal: Negative for back pain and neck stiffness.  Skin: Negative for rash.  Neurological: Negative for syncope and headaches.     Physical Exam Updated Vital Signs BP 117/62 (BP Location: Left Arm)   Pulse 80   Temp 99.9 F (37.7 C) (Oral)   Resp 16   SpO2 93%   Physical Exam Vitals signs and nursing note reviewed.  Constitutional:      General: He is not in acute distress.    Appearance: He is well-developed. He is not diaphoretic.  HENT:     Head: Normocephalic and atraumatic.  Eyes:     Conjunctiva/sclera: Conjunctivae normal.  Neck:     Musculoskeletal: Normal range of motion.  Cardiovascular:     Rate and Rhythm: Normal rate and regular rhythm.     Heart sounds: Normal heart sounds.  No murmur. No friction rub. No gallop.   Pulmonary:     Effort: Pulmonary effort is normal. No respiratory distress.     Breath sounds: Normal breath sounds. No wheezing or rales.  Abdominal:     General: There is no distension.     Palpations: Abdomen is soft.     Tenderness: There is abdominal tenderness in the suprapubic area and left lower quadrant. There is no guarding.  Skin:    General: Skin is warm and dry.  Neurological:     Mental Status: He is alert and oriented to person, place, and time.      ED Treatments / Results  Labs (all labs ordered are listed, but only abnormal results are displayed) Labs Reviewed  COMPREHENSIVE METABOLIC PANEL - Abnormal; Notable for the following components:      Result Value   Chloride 95 (*)    Glucose,  Bld 110 (*)    Calcium 8.7 (*)    Albumin 3.3 (*)    All other components within normal limits  URINALYSIS, ROUTINE W REFLEX MICROSCOPIC - Abnormal; Notable for the following components:   Specific Gravity, Urine 1.034 (*)    Hgb urine dipstick SMALL (*)    Ketones, ur 80 (*)    Protein, ur 30 (*)    All other components within normal limits  CULTURE, BLOOD (ROUTINE X 2)  CULTURE, BLOOD (ROUTINE X 2)  LIPASE, BLOOD  CBC  CBC  CREATININE, SERUM  HIV ANTIBODY (ROUTINE TESTING W REFLEX)  COMPREHENSIVE METABOLIC PANEL  CBC    EKG None  Radiology Ct Abdomen Pelvis W Contrast  Result Date: 09/23/2018 CLINICAL DATA:  Pt c/o extreme pain in lower abdomin and not being able to urinate x 1 week EXAM: CT ABDOMEN AND PELVIS WITH CONTRAST TECHNIQUE: Multidetector CT imaging of the abdomen and pelvis was performed using the standard protocol following bolus administration of intravenous contrast. CONTRAST:  124mL OMNIPAQUE IOHEXOL 300 MG/ML  SOLN COMPARISON:  10/12/2015. FINDINGS: Lower chest: Stable benign 4 mm left lower lobe nodule, unchanged from prior CT. No acute findings. No new lung base nodules. Hepatobiliary: Subcentimeter  low-density lesion, segment 2 tiny low-density segment 6. Low-density 4-5 mm lesion seen in segment 7 on the prior CT is not visualized. Current visualized lesions are too small to characterize but cysts are likely. No other liver abnormality. Normal gallbladder. No bile duct dilation. Pancreas: Unremarkable. No pancreatic ductal dilatation or surrounding inflammatory changes. Spleen: Normal in size without focal abnormality. Adrenals/Urinary Tract: No adrenal masses. Kidneys normal in size, orientation and position. No convincing renal stones. No masses. No hydronephrosis. Normal ureters. Bladder is unremarkable. Stomach/Bowel: There is irregular thickening and adjacent inflammation along the mid to lower sigmoid colon, with there are several diverticula, consistent with acute diverticulitis. There is an apparent intramural fluid collection containing nondependent air, measuring 2.6 x 1.5 x 2.1 cm. No extraluminal air. Inflammatory changes abut the posterosuperior bladder. Remainder of the colon is normal in caliber. No additional wall thickening or inflammation. Stomach is unremarkable. Small bowel is normal in caliber with no wall thickening or inflammation. Normal appendix visualized. Vascular/Lymphatic: Mild aortic atherosclerosis. No enlarged lymph nodes. Reproductive: Mild prostate enlargement, 4.3 cm in greatest transverse dimension, stable from the prior CT. Other: No ascites. No free intraperitoneal air. No abdominal wall hernia. Musculoskeletal: No fracture or acute finding. No osteoblastic or osteolytic lesions. IMPRESSION: 1. Acute sigmoid diverticulitis associated with an intramural abscess, 2.6 cm in greatest dimension. No extraluminal or free air. This portion of the colon was involved by diverticulitis on the prior CT. 2. No other acute abnormality within the abdomen or pelvis. 3. Mild aortic atherosclerosis. Electronically Signed   By: Lajean Manes M.D.   On: 09/23/2018 13:21     Procedures Procedures (including critical care time)  Medications Ordered in ED Medications  sodium chloride flush (NS) 0.9 % injection 3 mL (3 mLs Intravenous Not Given 09/23/18 1101)  morphine 2 MG/ML injection 2-4 mg (4 mg Intravenous Given 09/23/18 1411)  ondansetron (ZOFRAN) injection 4 mg (4 mg Intravenous Given 09/23/18 1411)  nebivolol (BYSTOLIC) tablet 10 mg (10 mg Oral Not Given 09/23/18 1542)  levothyroxine (SYNTHROID, LEVOTHROID) tablet 125 mcg (125 mcg Oral Not Given 09/23/18 1542)  enoxaparin (LOVENOX) injection 40 mg (has no administration in time range)  0.9 % NaCl with KCl 20 mEq/ L  infusion ( Intravenous New Bag/Given 09/23/18 1538)  ondansetron (ZOFRAN) tablet 4 mg (has no administration in time range)    Or  ondansetron (ZOFRAN) injection 4 mg (has no administration in time range)  morphine 2 MG/ML injection 2 mg (2 mg Intravenous Given 09/23/18 1855)  piperacillin-tazobactam (ZOSYN) IVPB 3.375 g (has no administration in time range)  sodium chloride 0.9 % bolus 1,000 mL (0 mLs Intravenous Stopped 09/23/18 1335)  iohexol (OMNIPAQUE) 300 MG/ML solution 100 mL (100 mLs Intravenous Contrast Given 09/23/18 1249)  piperacillin-tazobactam (ZOSYN) IVPB 3.375 g (0 g Intravenous Stopped 09/23/18 1435)     Initial Impression / Assessment and Plan / ED Course  I have reviewed the triage vital signs and the nursing notes.  Pertinent labs & imaging results that were available during my care of the patient were reviewed by me and considered in my medical decision making (see chart for details).     63yo male with history of CAD, nonischemic cardiomyopathy, paroxysmal atrial flutter, tonsil cancer, who presents with concern for lower abdominal pain, difficulty urinating and constipation. DDx includes diverticulitis, prostatitis, UTI, constipation, SBO.   CT abdomen pelvis shows diverticulitis with abscess.  Given zosyn.  General Surgery consulted. Hospitalist admitting patient.    Final Clinical [name](s) / ED Diagnoses   Final diagnoses:  Diverticulitis of large intestine with abscess without bleeding    ED Discharge Orders    None       Gareth Morgan, MD 09/23/18 2107

## 2018-09-23 NOTE — Telephone Encounter (Signed)
Please see messages.

## 2018-09-23 NOTE — ED Notes (Signed)
173mL post void residual

## 2018-09-23 NOTE — Progress Notes (Signed)
Received pt from ED. Patient alert and oriented x4. Patient self ambulated from wheelchair to bed. Oriented to call bell/bed controls. Will continue to monitor.

## 2018-09-23 NOTE — ED Notes (Signed)
Pt back from CT

## 2018-09-23 NOTE — Telephone Encounter (Signed)
Will send to Dr Elease Hashimoto for any further rec's

## 2018-09-23 NOTE — Consult Note (Signed)
[facility] Surgery Consult/Admission Note  Adrian [name] 1956-05-29  [id number].    Requesting MD: Dr. Billy Fischer Chief Complaint/Reason for Consult: diverticulitis   HPI:   Pt is a 63 yo male with a hx of tonsil cancer s/p chemo and radiation, a fib on Eliquis, CAD, smoker who presented to the ED with abdominal pain. Pt states pain started 6 days ago. Five days ago pt had diarrhea but no real BM's since. Pain progressively worsened to severe, constant, non radiating in the LLQ/suprapubic region, worse with passing flatus and associated nausea and chills. No fevers or vomiting. No blood in stools. Pt has a hx of G tube but no other abdominal surgeries. Last colonoscopy was 2014 where some polyps were removed.   CT scan showed Acute sigmoid diverticulitis associated with an intramural abscess, 2.6 cm in greatest dimension. No extraluminal or free air. This portion of the colon was involved by diverticulitis on the prior CT in 2017 Labs: WBC 5.4  ROS:  Review of Systems  Constitutional: Positive for chills. Negative for diaphoresis and fever.  HENT: Negative for sore throat.   Respiratory: Negative for cough and shortness of breath.   Cardiovascular: Negative for chest pain.  Gastrointestinal: Positive for abdominal pain and nausea. Negative for blood in stool, constipation, diarrhea and vomiting.  Genitourinary: Negative for dysuria.  Skin: Negative for rash.  Neurological: Negative for dizziness and loss of consciousness.  All other systems reviewed and are negative.    Family History  Problem Relation Age of Onset  . Prostate cancer Father   . Colon cancer Father   . Skin cancer Mother   . Cancer Brother     Past Medical History:  Diagnosis Date  . CAD (coronary artery disease), native coronary artery 12/06/2015   a. 10/2015 MV: EF  37%, reversible defect inferior apex, intermediate risk findings; b. 10/2015 Cath: 20% mid RCA.  Marland Kitchen History of chemotherapy 2005   Cisplatin   . Hypothyroidism   . NICM (nonischemic cardiomyopathy) ([place]) 10/14/2015   a. Tachy mediated?;  b. Echo 3/17 - Mild concentric LVH, EF 30-35%, anteroseptal, anterior, anterolateral, apical anterior, lateral hypokinesis, trivial MR, mild to moderately reduced RVSF; c. LHC 3/17 - mRCA 20%  . Paroxysmal atrial flutter ([place])    a. TEE 3/17 with ? LAA clot-->s/p TEE/DCCV 11/24/2015  . Radiation NOv.3,2005-Dec. 15, 2005   6810 cGy in 30 fractions  . Tonsil cancer ([place]) 2005    Past Surgical History:  Procedure Laterality Date  . CARDIAC CATHETERIZATION N/A 10/18/2015   Procedure: Left Heart Cath and Coronary Angiography;  Surgeon: Burnell Blanks, MD;  Location: [place] CV LAB;  Service: Cardiovascular;  Laterality: N/A;  . CARDIOVERSION N/A 11/24/2015   Procedure: CARDIOVERSION;  Surgeon: Josue Hector, MD;  Location: [facility] ENDOSCOPY;  Service: Cardiovascular;  Laterality: N/A;  . LAMINECTOMY     C5/placement of steel plate  . NECK SURGERY  2003   replaced disk  . TEE WITHOUT CARDIOVERSION N/A 10/15/2015   Procedure: TRANSESOPHAGEAL ECHOCARDIOGRAM (TEE);  Surgeon: Larey Dresser, MD;  Location: [place];  Service: Cardiovascular;  Laterality: N/A;  . TEE WITHOUT CARDIOVERSION N/A 11/24/2015   Procedure: TRANSESOPHAGEAL ECHOCARDIOGRAM (TEE);  Surgeon: Josue Hector, MD;  Location: [facility] ENDOSCOPY;  Service: Cardiovascular;  Laterality: N/A;    Social History:  reports that he has been smoking cigarettes. He has a 10.00 pack-year smoking history. He has never used smokeless tobacco. He reports current alcohol use of about 12.0 standard  drinks of alcohol per week. He reports that he does not use drugs.  Allergies: No Known Allergies  (Not in a hospital admission)   Blood pressure (!) 150/72, pulse 89, temperature (!) 97.5 F (36.4 C), temperature source Oral, resp. rate 18, SpO2 97 %.  Physical Exam Vitals signs and nursing note reviewed.  Constitutional:      General: He is not  in acute distress.    Appearance: Normal appearance. He is not diaphoretic.  HENT:     Head: Normocephalic and atraumatic.     Nose: Nose normal.     Mouth/Throat:     Lips: Pink.     Mouth: Mucous membranes are moist.     Pharynx: Oropharynx is clear.  Eyes:     General: No scleral icterus.       Right eye: No discharge.        Left eye: No discharge.     Conjunctiva/sclera: Conjunctivae normal.     Comments: Pupils equal and round  Neck:     Musculoskeletal: Normal range of motion and neck supple.     Thyroid: No thyromegaly.  Cardiovascular:     Rate and Rhythm: Normal rate and regular rhythm.     Pulses:          Radial pulses are 2+ on the right side and 2+ on the left side.       Posterior tibial pulses are 2+ on the right side and 2+ on the left side.     Heart sounds: Normal heart sounds. No murmur.  Pulmonary:     Effort: Pulmonary effort is normal. No respiratory distress.     Breath sounds: Normal breath sounds. No wheezing, rhonchi or rales.  Abdominal:     General: Bowel sounds are normal. There is no distension.     Palpations: Abdomen is soft. Abdomen is not rigid.     Tenderness: There is abdominal tenderness in the suprapubic area and left lower quadrant. There is no guarding or rebound.  Musculoskeletal: Normal range of motion.        General: No tenderness or deformity.  Skin:    General: Skin is warm and dry.     Findings: No rash.  Neurological:     Mental Status: He is alert and oriented to person, place, and time.  Psychiatric:        Mood and Affect: Mood normal.        Behavior: Behavior normal.     Results for orders placed or performed during the hospital encounter of 09/23/18 (from the past 48 hour(s))  Lipase, blood     Status: None   Collection Time: 09/23/18  9:48 AM  Result Value Ref Range   Lipase 19 11 - 51 U/L    Comment: Performed at [facility], [address] [place], [place] [address]  Comprehensive metabolic panel      Status: Abnormal   Collection Time: 09/23/18  9:48 AM  Result Value Ref Range   Sodium 135 135 - 145 mmol/L   Potassium 3.8 3.5 - 5.1 mmol/L   Chloride 95 (L) 98 - 111 mmol/L   CO2 29 22 - 32 mmol/L   Glucose, Bld 110 (H) 70 - 99 mg/dL   BUN 10 8 - 23 mg/dL   Creatinine, Ser 0.99 0.61 - 1.24 mg/dL   Calcium 8.7 (L) 8.9 - 10.3 mg/dL   Total Protein 7.3 6.5 - 8.1 g/dL   Albumin 3.3 (L) 3.5 -  5.0 g/dL   AST 18 15 - 41 U/L   ALT 12 0 - 44 U/L   Alkaline Phosphatase 61 38 - 126 U/L   Total Bilirubin 0.7 0.3 - 1.2 mg/dL   GFR calc non Af Amer >60 >60 mL/min   GFR calc Af Amer >60 >60 mL/min   Anion gap 11 5 - 15    Comment: Performed at [facility] [address], [place], [place] [address]  CBC     Status: None   Collection Time: 09/23/18  9:48 AM  Result Value Ref Range   WBC 5.4 4.0 - 10.5 K/uL   RBC 4.89 4.22 - 5.81 MIL/uL   Hemoglobin 15.2 13.0 - 17.0 g/dL   HCT 46.1 39.0 - 52.0 %   MCV 94.3 80.0 - 100.0 fL   MCH 31.1 26.0 - 34.0 pg   MCHC 33.0 30.0 - 36.0 g/dL   RDW 13.2 11.5 - 15.5 %   Platelets 166 150 - 400 K/uL   nRBC 0.0 0.0 - 0.2 %    Comment: Performed at [facility], [place] [address], [place], [place] [address]  Urinalysis, Routine w reflex microscopic     Status: Abnormal   Collection Time: 09/23/18  1:24 PM  Result Value Ref Range   Color, Urine YELLOW YELLOW   APPearance CLEAR CLEAR   Specific Gravity, Urine 1.034 (H) 1.005 - 1.030   pH 8.0 5.0 - 8.0   Glucose, UA NEGATIVE NEGATIVE mg/dL   Hgb urine dipstick SMALL (A) NEGATIVE   Bilirubin Urine NEGATIVE NEGATIVE   Ketones, ur 80 (A) NEGATIVE mg/dL   Protein, ur 30 (A) NEGATIVE mg/dL   Nitrite NEGATIVE NEGATIVE   Leukocytes,Ua NEGATIVE NEGATIVE   RBC / HPF 21-50 0 - 5 RBC/hpf   WBC, UA 0-5 0 - 5 WBC/hpf   Bacteria, UA NONE SEEN NONE SEEN    Comment: Performed at [facility] [address] [place], [place] [address]   Ct Abdomen Pelvis W Contrast  Result Date: 09/23/2018 CLINICAL  DATA:  Pt c/o extreme pain in lower abdomin and not being able to urinate x 1 week EXAM: CT ABDOMEN AND PELVIS WITH CONTRAST TECHNIQUE: Multidetector CT imaging of the abdomen and pelvis was performed using the standard protocol following bolus administration of intravenous contrast. CONTRAST:  185mL OMNIPAQUE IOHEXOL 300 MG/ML  SOLN COMPARISON:  10/12/2015. FINDINGS: Lower chest: Stable benign 4 mm left lower lobe nodule, unchanged from prior CT. No acute findings. No new lung base nodules. Hepatobiliary: Subcentimeter low-density lesion, segment 2 tiny low-density segment 6. Low-density 4-5 mm lesion seen in segment 7 on the prior CT is not visualized. Current visualized lesions are too small to characterize but cysts are likely. No other liver abnormality. Normal gallbladder. No bile duct dilation. Pancreas: Unremarkable. No pancreatic ductal dilatation or surrounding inflammatory changes. Spleen: Normal in size without focal abnormality. Adrenals/Urinary Tract: No adrenal masses. Kidneys normal in size, orientation and position. No convincing renal stones. No masses. No hydronephrosis. Normal ureters. Bladder is unremarkable. Stomach/Bowel: There is irregular thickening and adjacent inflammation along the mid to lower sigmoid colon, with there are several diverticula, consistent with acute diverticulitis. There is an apparent intramural fluid collection containing nondependent air, measuring 2.6 x 1.5 x 2.1 cm. No extraluminal air. Inflammatory changes abut the posterosuperior bladder. Remainder of the colon is normal in caliber. No additional wall thickening or inflammation. Stomach is unremarkable. Small bowel is normal in caliber with no  wall thickening or inflammation. Normal appendix visualized. Vascular/Lymphatic: Mild aortic atherosclerosis. No enlarged lymph nodes. Reproductive: Mild prostate enlargement, 4.3 cm in greatest transverse dimension, stable from the prior CT. Other: No ascites. No free  intraperitoneal air. No abdominal wall hernia. Musculoskeletal: No fracture or acute finding. No osteoblastic or osteolytic lesions. IMPRESSION: 1. Acute sigmoid diverticulitis associated with an intramural abscess, 2.6 cm in greatest dimension. No extraluminal or free air. This portion of the colon was involved by diverticulitis on the prior CT. 2. No other acute abnormality within the abdomen or pelvis. 3. Mild aortic atherosclerosis. Electronically Signed   By: Lajean Manes M.D.   On: 09/23/2018 13:21      Assessment/Plan Active Problems:   * No active hospital problems. *  Hx of tonsil cancer S/P radiation and chemo Hx of G tube Hx of colon polyps A fib on Eliquis Tobacco abuse - discussed cessation   Diverticulitis with intramural abscess - 2nd episode, 1st in 2017 - admit for IV abx, bowel rest, IVF - hopefully this will resolve without need for surgical intervention  FEN: NPO, ice chips, IVF VTE: SCD's, heparin okay please hold Eliquis in event pt needs surgery ID: Zosyn Foley: none Follow up: TBD, will need colonoscopy in 6-8 weeks  Thank you for the consult. We will follow.   Kalman Drape, [facility] : [facility] Surgery 09/23/2018, 2:13 PM Pager: [phone] Consults: [phone] Mon-Fri 7:00 am-4:30 pm Sat-Sun 7:00 am-11:30 am

## 2018-09-23 NOTE — ED Notes (Signed)
ED Provider at bedside. 

## 2018-09-24 DIAGNOSIS — K5792 Diverticulitis of intestine, part unspecified, without perforation or abscess without bleeding: Secondary | ICD-10-CM

## 2018-09-24 LAB — COMPREHENSIVE METABOLIC PANEL
ALT: 10 U/L (ref 0–44)
AST: 14 U/L — AB (ref 15–41)
Albumin: 2.5 g/dL — ABNORMAL LOW (ref 3.5–5.0)
Alkaline Phosphatase: 51 U/L (ref 38–126)
Anion gap: 8 (ref 5–15)
BUN: 10 mg/dL (ref 8–23)
CO2: 27 mmol/L (ref 22–32)
Calcium: 8.2 mg/dL — ABNORMAL LOW (ref 8.9–10.3)
Chloride: 104 mmol/L (ref 98–111)
Creatinine, Ser: 1.07 mg/dL (ref 0.61–1.24)
GFR calc Af Amer: >60 mL/min (ref >60)
GFR calc non Af Amer: >60 mL/min (ref >60)
Glucose, Bld: 87 mg/dL (ref 70–99)
Potassium: 4 mmol/L (ref 3.5–5.1)
Sodium: 139 mmol/L (ref 135–145)
Total Bilirubin: 1 mg/dL (ref 0.3–1.2)
Total Protein: 5.9 g/dL — ABNORMAL LOW (ref 6.5–8.1)

## 2018-09-24 LAB — CBC
HCT: 38.1 % — ABNORMAL LOW (ref 39.0–52.0)
Hemoglobin: 13.1 g/dL (ref 13.0–17.0)
MCH: 32.2 pg (ref 26.0–34.0)
MCHC: 34.4 g/dL (ref 30.0–36.0)
MCV: 93.6 fL (ref 80.0–100.0)
NRBC: 0 % (ref 0.0–0.2)
Platelets: 142 10*3/uL — ABNORMAL LOW (ref 150–400)
RBC: 4.07 MIL/uL — ABNORMAL LOW (ref 4.22–5.81)
RDW: 13.3 % (ref 11.5–15.5)
WBC: 6.9 10*3/uL (ref 4.0–10.5)

## 2018-09-24 LAB — HIV ANTIBODY (ROUTINE TESTING W REFLEX): HIV Screen 4th Generation wRfx: NONREACTIVE

## 2018-09-24 LAB — APTT: aPTT: 47 seconds — ABNORMAL HIGH (ref 24–36)

## 2018-09-24 LAB — HEPARIN LEVEL (UNFRACTIONATED): Heparin Unfractionated: 0.27 IU/mL — ABNORMAL LOW (ref 0.30–0.70)

## 2018-09-24 MED ORDER — HEPARIN (PORCINE) 25000 UT/250ML-% IV SOLN
1150.0000 [IU]/h | INTRAVENOUS | Status: DC
  Administered 2018-09-24: 850 [IU]/h via INTRAVENOUS
  Administered 2018-09-25: 1150 [IU]/h via INTRAVENOUS
  Filled 2018-09-24 (×3): qty 250

## 2018-09-24 MED ORDER — HEPARIN BOLUS VIA INFUSION
1000.0000 [IU] | Freq: Once | INTRAVENOUS | Status: AC
  Administered 2018-09-24: 1000 [IU] via INTRAVENOUS
  Filled 2018-09-24: qty 1000

## 2018-09-24 MED ORDER — ACETAMINOPHEN 325 MG PO TABS
650.0000 mg | ORAL_TABLET | Freq: Four times a day (QID) | ORAL | Status: DC | PRN
  Administered 2018-09-24: 650 mg via ORAL
  Filled 2018-09-24: qty 2

## 2018-09-24 MED ORDER — ACETAMINOPHEN 325 MG PO TABS: 650.0000 mg | ORAL_TABLET | Freq: Three times a day (TID) | ORAL | Status: DC | PRN

## 2018-09-24 NOTE — Progress Notes (Signed)
ANTICOAGULATION CONSULT NOTE - Initial Consult  Pharmacy Consult:  Heparin Indication: atrial fibrillation  No Known Allergies  Patient Measurements: Weight: 134 lb (60.8 kg) Heparin Dosing Weight: 60 kg  Vital Signs: Temp: 100.4 F (38 C) (02/18 1615) Temp Source: Oral (02/18 1615) BP: 143/72 (02/18 1615) Pulse Rate: 70 (02/18 1615)  Labs: Recent Labs    09/23/18 0948 09/23/18 1531 09/24/18 0153 09/24/18 1907  HGB 15.2 14.2 13.1  --   HCT 46.1 42.0 38.1*  --   PLT 166 158 142*  --   APTT  --   --   --  47*  HEPARINUNFRC  --   --   --  0.27*  CREATININE 0.99 0.86 1.07  --     Estimated Creatinine Clearance: 61.6 mL/min (by C-G formula based on SCr of 1.07 mg/dL).   Medical History: Past Medical History:  Diagnosis Date  . CAD (coronary artery disease), native coronary artery 12/06/2015   a. 10/2015 MV: EF  37%, reversible defect inferior apex, intermediate risk findings; b. 10/2015 Cath: 20% mid RCA.  Marland Kitchen History of chemotherapy 2005   Cisplatin  . Hypothyroidism   . NICM (nonischemic cardiomyopathy) ([place]) 10/14/2015   a. Tachy mediated?;  b. Echo 3/17 - Mild concentric LVH, EF 30-35%, anteroseptal, anterior, anterolateral, apical anterior, lateral hypokinesis, trivial MR, mild to moderately reduced RVSF; c. LHC 3/17 - mRCA 20%  . Paroxysmal atrial flutter ([place])    a. TEE 3/17 with ? LAA clot-->s/p TEE/DCCV 11/24/2015  . Radiation NOv.3,2005-Dec. 15, 2005   6810 cGy in 30 fractions  . Tonsil cancer ([place]) 2005     Assessment: 30 YOM presented with abdominal pain and may require surgery.  Pharmacy has been consulted to dose IV heparin for history of Afib while Eliquis is on hold (last dose 2/16 PM).  No bleeding reported.  Initial heparin level 0.27 and APTT 47 sec which seems to correlate  Goal of Therapy:  Heparin level 0.3-0.7 units/ml Monitor platelets by anticoagulation protocol: Yes   Plan:  Bolus with 1000 units IV x 1 Increase Heparin gtt to 1000  units/hr Check 6 hr HL Daily heparin level and CBC  Eman Rynders A. Levada Dy, PharmD, [place] Please utilize Amion for appropriate phone number to reach the unit pharmacist ([place])    09/24/2018, 8:00 PM

## 2018-09-24 NOTE — Progress Notes (Signed)
ANTICOAGULATION CONSULT NOTE - Initial Consult  Pharmacy Consult:  Heparin Indication: atrial fibrillation  No Known Allergies  Patient Measurements: Weight: 134 lb (60.8 kg) Heparin Dosing Weight: 60 kg  Vital Signs: Temp: 99.5 F (37.5 C) (02/18 0422) Temp Source: Oral (02/18 0422) BP: 133/69 (02/18 0422) Pulse Rate: 72 (02/18 0422)  Labs: Recent Labs    09/23/18 0948 09/23/18 1531 09/24/18 0153  HGB 15.2 14.2 13.1  HCT 46.1 42.0 38.1*  PLT 166 158 142*  CREATININE 0.99 0.86 1.07    Estimated Creatinine Clearance: 61.6 mL/min (by C-G formula based on SCr of 1.07 mg/dL).   Medical History: Past Medical History:  Diagnosis Date  . CAD (coronary artery disease), native coronary artery 12/06/2015   a. 10/2015 MV: EF  37%, reversible defect inferior apex, intermediate risk findings; b. 10/2015 Cath: 20% mid RCA.  Marland Kitchen History of chemotherapy 2005   Cisplatin  . Hypothyroidism   . NICM (nonischemic cardiomyopathy) ([place]) 10/14/2015   a. Tachy mediated?;  b. Echo 3/17 - Mild concentric LVH, EF 30-35%, anteroseptal, anterior, anterolateral, apical anterior, lateral hypokinesis, trivial MR, mild to moderately reduced RVSF; c. LHC 3/17 - mRCA 20%  . Paroxysmal atrial flutter ([place])    a. TEE 3/17 with ? LAA clot-->s/p TEE/DCCV 11/24/2015  . Radiation NOv.3,2005-Dec. 15, 2005   6810 cGy in 30 fractions  . Tonsil cancer ([place]) 2005     Assessment: 21 YOM presented with abdominal pain and may require surgery.  Pharmacy has been consulted to dose IV heparin for history of Afib while Eliquis is on hold (last dose 2/16 PM).  No bleeding reported.  Goal of Therapy:  Heparin level 0.3-0.7 units/ml Monitor platelets by anticoagulation protocol: Yes   Plan:  Heparin gtt at 850 units/hr Check 6 hr aPTT Daily heparin level, aPTT and CBC   Shirlyn Savin D. Mina Marble, PharmD, BCPS, [place] 09/24/2018, 12:25 PM

## 2018-09-24 NOTE — Progress Notes (Signed)
PROGRESS NOTE    Adrian [name]  UXL:[id number] DOB: 01/17/1956 DOA: 09/23/2018 PCP: Kristian Covey, MD   Brief Narrative:  63 year old with history of atrial fibrillation, coronary artery disease previous history of diverticulitis came to the hospital due to complaints of lower abdominal pain.  He was found to have acute sigmoid diverticulitis with intramural abscess about 2.5 cm.  General surgery was consulted who recommended medical management.  Patient was started on IV antibiotics.   Assessment & Plan:   Principal Problem:   Diverticulitis Active Problems:   Hypothyroidism   Atrial flutter (HCC)   CAD (coronary artery disease), native coronary artery   Diverticulitis of large intestine with abscess  Acute sigmoid diverticulitis with abscess, 2.3 cm - No free air noted.  Currently hemodynamically stable.  We will continue IV Zosyn at this time - Diet as tolerated, antiemetics, pain control, IV fluids -Monitor urine output -General surgery following. -Last colonoscopy 2014 which showed polyps and diverticulosis.  He is due for another colonoscopy, would benefit from one in 6-8 weeks after this episode has resolved  History of paroxysmal atrial fibrillation -Currently rate controlled.  On home he is on Eliquis.  Will use heparin drip for now in case if he ends up requiring surgery.  History of coronary artery disease -Currently patient is chest pain-free.  Continue home regimen at this time.  Hypothyroidism -Continue Synthroid  DVT prophylaxis: Heparin drip Code Status: Full code Family Communication: None at bedside Disposition Plan: To be determined  Consultants:   General surgery  Procedures:   None  Antimicrobials:   IV Zosyn day 2   Subjective: States he still has lower abdominal discomfort but slightly improved from yesterday.  Off-and-on still feels nauseous.  Review of Systems Otherwise negative except as per HPI, including: General: Denies  fever, chills, night sweats or unintended weight loss. Resp: Denies cough, wheezing, shortness of breath. Cardiac: Denies chest pain, palpitations, orthopnea, paroxysmal nocturnal dyspnea. GI: Denies  vomiting, diarrhea or constipation GU: Denies dysuria, frequency, hesitancy or incontinence MS: Denies muscle aches, joint pain or swelling Neuro: Denies headache, neurologic deficits (focal weakness, numbness, tingling), abnormal gait Psych: Denies anxiety, depression, SI/HI/AVH Skin: Denies new rashes or lesions ID: Denies sick contacts, exotic exposures, travel  Objective: Vitals:   09/23/18 1445 09/23/18 1530 09/23/18 2204 09/24/18 0422  BP: 133/71 117/62 129/64 133/69  Pulse: 84 80 75 72  Resp: (!) 23 16 16 17   Temp:  99.9 F (37.7 C) 99.7 F (37.6 C) 99.5 F (37.5 C)  TempSrc:  Oral Oral Oral  SpO2: 93% 93% 96% 94%    Intake/Output Summary (Last 24 hours) at 09/24/2018 1123 Last data filed at 09/24/2018 0656 Gross per 24 hour  Intake 1957.97 ml  Output 825 ml  Net 1132.97 ml   There were no vitals filed for this visit.  Examination:  General exam: Appears calm and comfortable  Respiratory system: Clear to auscultation. Respiratory effort normal. Cardiovascular system: S1 & S2 heard, RRR. No JVD, murmurs, rubs, gallops or clicks. No pedal edema. Gastrointestinal system: Abdomen is nondistended, soft and nontender. No organomegaly or masses felt. Normal bowel sounds heard. Central nervous system: Alert and oriented. No focal neurological deficits. Extremities: Symmetric 5 x 5 power. Skin: No rashes, lesions or ulcers Psychiatry: Judgement and insight appear normal. Mood & affect appropriate.     Data Reviewed:   CBC: Recent Labs  Lab 09/23/18 0948 09/23/18 1531 09/24/18 0153  WBC 5.4 4.9 6.9  HGB 15.2 14.2  13.1  HCT 46.1 42.0 38.1*  MCV 94.3 92.3 93.6  PLT 166 158 142*   Basic Metabolic Panel: Recent Labs  Lab 09/23/18 0948 09/23/18 1531 09/24/18 0153    NA 135  --  139  K 3.8  --  4.0  CL 95*  --  104  CO2 29  --  27  GLUCOSE 110*  --  87  BUN 10  --  10  CREATININE 0.99 0.86 1.07  CALCIUM 8.7*  --  8.2*   GFR: CrCl cannot be calculated (Unknown ideal weight.). Liver Function Tests: Recent Labs  Lab 09/23/18 0948 09/24/18 0153  AST 18 14*  ALT 12 10  ALKPHOS 61 51  BILITOT 0.7 1.0  PROT 7.3 5.9*  ALBUMIN 3.3* 2.5*   Recent Labs  Lab 09/23/18 0948  LIPASE 19   No results for input(s): AMMONIA in the last 168 hours. Coagulation Profile: No results for input(s): INR, PROTIME in the last 168 hours. Cardiac Enzymes: No results for input(s): CKTOTAL, CKMB, CKMBINDEX, TROPONINI in the last 168 hours. BNP (last 3 results) No results for input(s): PROBNP in the last 8760 hours. HbA1C: No results for input(s): HGBA1C in the last 72 hours. CBG: No results for input(s): GLUCAP in the last 168 hours. Lipid Profile: No results for input(s): CHOL, HDL, LDLCALC, TRIG, CHOLHDL, LDLDIRECT in the last 72 hours. Thyroid Function Tests: No results for input(s): TSH, T4TOTAL, FREET4, T3FREE, THYROIDAB in the last 72 hours. Anemia Panel: No results for input(s): VITAMINB12, FOLATE, FERRITIN, TIBC, IRON, RETICCTPCT in the last 72 hours. Sepsis Labs: No results for input(s): PROCALCITON, LATICACIDVEN in the last 168 hours.  Recent Results (from the past 240 hour(s))  Blood culture (routine x 2)     Status: None (Preliminary result)   Collection Time: 09/23/18  1:54 PM  Result Value Ref Range Status   Specimen Description BLOOD RIGHT FOREARM  Final   Special Requests   Final    BOTTLES DRAWN AEROBIC AND ANAEROBIC Blood Culture adequate volume   Culture   Final    NO GROWTH < 24 HOURS Performed at [facility], [address], [place], [place] [address]    Report Status PENDING  Incomplete  Blood culture (routine x 2)     Status: None (Preliminary result)   Collection Time: 09/23/18  2:03 PM  Result Value Ref Range Status    Specimen Description BLOOD LEFT FOREARM  Final   Special Requests   Final    BOTTLES DRAWN AEROBIC AND ANAEROBIC Blood Culture results may not be optimal due to an excessive volume of blood received in culture bottles   Culture   Final    NO GROWTH < 24 HOURS Performed at [facility], [address], [place], [place] [address]    Report Status PENDING  Incomplete         Radiology Studies: Ct Abdomen Pelvis W Contrast  Result Date: 09/23/2018 CLINICAL DATA:  Pt c/o extreme pain in lower abdomin and not being able to urinate x 1 week EXAM: CT ABDOMEN AND PELVIS WITH CONTRAST TECHNIQUE: Multidetector CT imaging of the abdomen and pelvis was performed using the standard protocol following bolus administration of intravenous contrast. CONTRAST:  OMNIPAQUE IOHEXOL 300 MG/ML  SOLN COMPARISON:  10/12/2015. FINDINGS: Lower chest: Stable benign 4 mm left lower lobe nodule, unchanged from prior CT. No acute findings. No new lung base nodules. Hepatobiliary: Subcentimeter low-density lesion, segment 2 tiny low-density segment 6. Low-density  4-5 mm lesion seen in segment 7 on the prior CT is not visualized. Current visualized lesions are too small to characterize but cysts are likely. No other liver abnormality. Normal gallbladder. No bile duct dilation. Pancreas: Unremarkable. No pancreatic ductal dilatation or surrounding inflammatory changes. Spleen: Normal in size without focal abnormality. Adrenals/Urinary Tract: No adrenal masses. Kidneys normal in size, orientation and position. No convincing renal stones. No masses. No hydronephrosis. Normal ureters. Bladder is unremarkable. Stomach/Bowel: There is irregular thickening and adjacent inflammation along the mid to lower sigmoid colon, with there are several diverticula, consistent with acute diverticulitis. There is an apparent intramural fluid collection containing nondependent air, measuring 2.6 x 1.5 x 2.1 cm. No extraluminal air.  Inflammatory changes abut the posterosuperior bladder. Remainder of the colon is normal in caliber. No additional wall thickening or inflammation. Stomach is unremarkable. Small bowel is normal in caliber with no wall thickening or inflammation. Normal appendix visualized. Vascular/Lymphatic: Mild aortic atherosclerosis. No enlarged lymph nodes. Reproductive: Mild prostate enlargement, 4.3 cm in greatest transverse dimension, stable from the prior CT. Other: No ascites. No free intraperitoneal air. No abdominal wall hernia. Musculoskeletal: No fracture or acute finding. No osteoblastic or osteolytic lesions. IMPRESSION: 1. Acute sigmoid diverticulitis associated with an intramural abscess, 2.6 cm in greatest dimension. No extraluminal or free air. This portion of the colon was involved by diverticulitis on the prior CT. 2. No other acute abnormality within the abdomen or pelvis. 3. Mild aortic atherosclerosis. Electronically Signed   By: Amie Portland M.D.   On: 09/23/2018 13:21        Scheduled Meds: . enoxaparin (LOVENOX) injection  40 mg Subcutaneous Q24H  . levothyroxine  125 mcg Oral Daily  . nebivolol  10 mg Oral Daily  . sodium chloride flush  3 mL Intravenous Once   Continuous Infusions: . 0.9 % NaCl with KCl 20 mEq / L 125 mL/hr at 09/23/18 2304  . piperacillin-tazobactam (ZOSYN)  IV 3.375 g (09/24/18 0551)     LOS: 1 day   Time spent= 35 mins    Nekesha Font Joline Maxcy, MD [facility]  If 7PM-7AM, please contact night-coverage [email or web address] 09/24/2018, 11:23 AM

## 2018-09-24 NOTE — Progress Notes (Signed)
[facility] [place] [facility]/Trauma Progress Note      Assessment/Plan Hx of tonsil cancer S/P radiation and chemo Hx of G tube Hx of colon polyps A fib on Eliquis Tobacco abuse - discussed cessation   Diverticulitis with intramural abscess - 2nd episode, 1st in 2017 - hopefully this will resolve without need for surgical intervention - pain improved, PO pain medicine   FEN: CLD VTE: SCD's, heparin okay please hold Eliquis in event pt needs surgery ID: Zosyn Foley: none Follow up: TBD, will need colonoscopy in 6-8 weeks   LOS: 1 day    Subjective: CC: diverticulitis   Pt is feeling better today. Less pain. No issues overnight. Urinating better.   Objective: Vital signs in last 24 hours: Temp:  [97.5 F (36.4 C)-99.9 F (37.7 C)] 99.5 F (37.5 C) (02/18 0422) Pulse Rate:  [72-94] 72 (02/18 0422) Resp:  [16-23] 17 (02/18 0422) BP: (101-164)/(62-106) 133/69 (02/18 0422) SpO2:  [93 %-100 %] 94 % (02/18 0422) Last BM Date: 09/18/18  Intake/Output from previous day: 02/17 0701 - 02/18 0700 In: 1958 [I.V.:913.1; IV Piggyback:1044.9] Out: 825 [Urine:825] Intake/Output this shift: No intake/output data recorded.  PE: Gen:  Alert, NAD, pleasant, cooperative Pulm:  Rate and effort normal Abd: Soft, ND, +BS, mild TTP in LLQ and suprapubic region, no guarding, no peritonitis  Skin: no rashes noted, warm and dry   Anti-infectives: Anti-infectives (From admission, onward)   Start     Dose/Rate Route Frequency Ordered Stop   09/23/18 2200  piperacillin-tazobactam (ZOSYN) IVPB 3.375 g     3.375 g 12.5 mL/hr over 240 Minutes Intravenous Every 8 hours 09/23/18 1507     09/23/18 1345  piperacillin-tazobactam (ZOSYN) IVPB 3.375 g     3.375 g 100 mL/hr over 30 Minutes Intravenous  Once 09/23/18 1334 09/23/18 1435      Lab Results:  Recent Labs    09/23/18 1531 09/24/18 0153  WBC 4.9 6.9  HGB 14.2 13.1  HCT 42.0 38.1*  PLT 158 142*   BMET Recent Labs     09/23/18 0948 09/23/18 1531 09/24/18 0153  NA 135  --  139  K 3.8  --  4.0  CL 95*  --  104  CO2 29  --  27  GLUCOSE 110*  --  87  BUN 10  --  10  CREATININE 0.99 0.86 1.07  CALCIUM 8.7*  --  8.2*   PT/INR No results for input(s): LABPROT, INR in the last 72 hours. CMP     Component Value Date/Time   NA 139 09/24/2018 0153   NA 134 01/23/2017 0826   K 4.0 09/24/2018 0153   CL 104 09/24/2018 0153   CO2 27 09/24/2018 0153   GLUCOSE 87 09/24/2018 0153   BUN 10 09/24/2018 0153   BUN 6 (L) 01/23/2017 0826   CREATININE 1.07 09/24/2018 0153   CREATININE 0.88 11/22/2015 1137   CALCIUM 8.2 (L) 09/24/2018 0153   PROT 5.9 (L) 09/24/2018 0153   PROT 7.1 03/09/2017 1032   ALBUMIN 2.5 (L) 09/24/2018 0153   ALBUMIN 4.4 03/09/2017 1032   AST 14 (L) 09/24/2018 0153   ALT 10 09/24/2018 0153   ALKPHOS 51 09/24/2018 0153   BILITOT 1.0 09/24/2018 0153   BILITOT 0.4 03/09/2017 1032   GFRNONAA >60 09/24/2018 0153   GFRAA >60 09/24/2018 0153   Lipase     Component Value Date/Time   LIPASE 19 09/23/2018 0948    Studies/Results: Ct Abdomen Pelvis W Contrast  Result Date:  09/23/2018 CLINICAL DATA:  Pt c/o extreme pain in lower abdomin and not being able to urinate x 1 week EXAM: CT ABDOMEN AND PELVIS WITH CONTRAST TECHNIQUE: Multidetector CT imaging of the abdomen and pelvis was performed using the standard protocol following bolus administration of intravenous contrast. CONTRAST:  143mL OMNIPAQUE IOHEXOL 300 MG/ML  SOLN COMPARISON:  10/12/2015. FINDINGS: Lower chest: Stable benign 4 mm left lower lobe nodule, unchanged from prior CT. No acute findings. No new lung base nodules. Hepatobiliary: Subcentimeter low-density lesion, segment 2 tiny low-density segment 6. Low-density 4-5 mm lesion seen in segment 7 on the prior CT is not visualized. Current visualized lesions are too small to characterize but cysts are likely. No other liver abnormality. Normal gallbladder. No bile duct dilation.  Pancreas: Unremarkable. No pancreatic ductal dilatation or surrounding inflammatory changes. Spleen: Normal in size without focal abnormality. Adrenals/Urinary Tract: No adrenal masses. Kidneys normal in size, orientation and position. No convincing renal stones. No masses. No hydronephrosis. Normal ureters. Bladder is unremarkable. Stomach/Bowel: There is irregular thickening and adjacent inflammation along the mid to lower sigmoid colon, with there are several diverticula, consistent with acute diverticulitis. There is an apparent intramural fluid collection containing nondependent air, measuring 2.6 x 1.5 x 2.1 cm. No extraluminal air. Inflammatory changes abut the posterosuperior bladder. Remainder of the colon is normal in caliber. No additional wall thickening or inflammation. Stomach is unremarkable. Small bowel is normal in caliber with no wall thickening or inflammation. Normal appendix visualized. Vascular/Lymphatic: Mild aortic atherosclerosis. No enlarged lymph nodes. Reproductive: Mild prostate enlargement, 4.3 cm in greatest transverse dimension, stable from the prior CT. Other: No ascites. No free intraperitoneal air. No abdominal wall hernia. Musculoskeletal: No fracture or acute finding. No osteoblastic or osteolytic lesions. IMPRESSION: 1. Acute sigmoid diverticulitis associated with an intramural abscess, 2.6 cm in greatest dimension. No extraluminal or free air. This portion of the colon was involved by diverticulitis on the prior CT. 2. No other acute abnormality within the abdomen or pelvis. 3. Mild aortic atherosclerosis. Electronically Signed   By: Lajean Manes M.D.   On: 09/23/2018 13:21      Kalman Drape , [facility] 09/24/2018, 9:17 AM  Pager: [phone] Mon-Wed, Friday 7:00am-4:30pm Thurs 7am-11:30am  Consults: [phone]

## 2018-09-25 ENCOUNTER — Encounter (HOSPITAL_COMMUNITY): Payer: Self-pay

## 2018-09-25 ENCOUNTER — Other Ambulatory Visit: Payer: Self-pay

## 2018-09-25 LAB — CBC
HCT: 34.5 % — ABNORMAL LOW (ref 39.0–52.0)
Hemoglobin: 11.7 g/dL — ABNORMAL LOW (ref 13.0–17.0)
MCH: 31.6 pg (ref 26.0–34.0)
MCHC: 33.9 g/dL (ref 30.0–36.0)
MCV: 93.2 fL (ref 80.0–100.0)
Platelets: 143 10*3/uL — ABNORMAL LOW (ref 150–400)
RBC: 3.7 MIL/uL — ABNORMAL LOW (ref 4.22–5.81)
RDW: 13.3 % (ref 11.5–15.5)
WBC: 6 10*3/uL (ref 4.0–10.5)
nRBC: 0 % (ref 0.0–0.2)

## 2018-09-25 LAB — HEPARIN LEVEL (UNFRACTIONATED)
Heparin Unfractionated: 0.33 IU/mL (ref 0.30–0.70)
Heparin Unfractionated: 0.2 IU/mL — ABNORMAL LOW (ref 0.30–0.70)
Heparin Unfractionated: 0.37 IU/mL (ref 0.30–0.70)

## 2018-09-25 NOTE — Progress Notes (Signed)
[facility]/Trauma Progress Note      Assessment/Plan Hx of tonsil cancer S/P radiation and chemo Hx of G tube Hx of colon polyps A fib on Eliquis Tobacco abuse - discussed cessation   Diverticulitis with intramural abscess - 2nd episode, 1st in 2017 - hopefully this will resolve without need for surgical intervention - pain improved, PO pain medicine  - Advance diet to fulls  FEN:FLD VTE: SCD's,heparin okay please hold Eliquis in event pt needs surgery HE:NIDPO Foley:none Follow up:TBD, will need colonoscopy in 6-8 weeks with Dr. Henrene Pastor   LOS: 2 days    Subjective: CC: abdominal pain  Pain is improving. He is tolerating clears. No BM. Having flatus. Urinating better. No nausea or vomiting.   Objective: Vital signs in last 24 hours: Temp:  [98.6 F (37 C)-100.4 F (38 C)] 98.9 F (37.2 C) (02/19 0539) Pulse Rate:  [57-70] 57 (02/19 0539) Resp:  [16-18] 16 (02/19 0539) BP: (117-143)/(71-75) 140/75 (02/19 0539) SpO2:  [97 %-100 %] 97 % (02/19 0539) Weight:  [60.8 kg] 60.8 kg (02/18 1100) Last BM Date: 09/18/18  Intake/Output from previous day: 02/18 0701 - 02/19 0700 In: 3287.3 [I.V.:3102.1; IV Piggyback:185.2] Out: 800 [Urine:800] Intake/Output this shift: No intake/output data recorded.  PE: Gen:  Alert, NAD, pleasant, cooperative Pulm:  Rate and effort normal Abd: Soft, ND, +BS, mild TTP in suprapubic region, no guarding, no peritonitis  Skin: no rashes noted, warm and dry  Anti-infectives: Anti-infectives (From admission, onward)   Start     Dose/Rate Route Frequency Ordered Stop   09/23/18 2200  piperacillin-tazobactam (ZOSYN) IVPB 3.375 g     3.375 g 12.5 mL/hr over 240 Minutes Intravenous Every 8 hours 09/23/18 1507     09/23/18 1345  piperacillin-tazobactam (ZOSYN) IVPB 3.375 g     3.375 g 100 mL/hr over 30 Minutes Intravenous  Once 09/23/18 1334 09/23/18 1435      Lab Results:  Recent Labs    09/24/18 0153  09/25/18 0142  WBC 6.9 6.0  HGB 13.1 11.7*  HCT 38.1* 34.5*  PLT 142* 143*   BMET Recent Labs    09/23/18 0948 09/23/18 1531 09/24/18 0153  NA 135  --  139  K 3.8  --  4.0  CL 95*  --  104  CO2 29  --  27  GLUCOSE 110*  --  87  BUN 10  --  10  CREATININE 0.99 0.86 1.07  CALCIUM 8.7*  --  8.2*   PT/INR No results for input(s): LABPROT, INR in the last 72 hours. CMP     Component Value Date/Time   NA 139 09/24/2018 0153   NA 134 01/23/2017 0826   K 4.0 09/24/2018 0153   CL 104 09/24/2018 0153   CO2 27 09/24/2018 0153   GLUCOSE 87 09/24/2018 0153   BUN 10 09/24/2018 0153   BUN 6 (L) 01/23/2017 0826   CREATININE 1.07 09/24/2018 0153   CREATININE 0.88 11/22/2015 1137   CALCIUM 8.2 (L) 09/24/2018 0153   PROT 5.9 (L) 09/24/2018 0153   PROT 7.1 03/09/2017 1032   ALBUMIN 2.5 (L) 09/24/2018 0153   ALBUMIN 4.4 03/09/2017 1032   AST 14 (L) 09/24/2018 0153   ALT 10 09/24/2018 0153   ALKPHOS 51 09/24/2018 0153   BILITOT 1.0 09/24/2018 0153   BILITOT 0.4 03/09/2017 1032   GFRNONAA >60 09/24/2018 0153   GFRAA >60 09/24/2018 0153   Lipase     Component Value Date/Time   LIPASE 19 09/23/2018  0948    Studies/Results: Ct Abdomen Pelvis W Contrast  Result Date: 09/23/2018 CLINICAL DATA:  Pt c/o extreme pain in lower abdomin and not being able to urinate x 1 week EXAM: CT ABDOMEN AND PELVIS WITH CONTRAST TECHNIQUE: Multidetector CT imaging of the abdomen and pelvis was performed using the standard protocol following bolus administration of intravenous contrast. CONTRAST:  148mL OMNIPAQUE IOHEXOL 300 MG/ML  SOLN COMPARISON:  10/12/2015. FINDINGS: Lower chest: Stable benign 4 mm left lower lobe nodule, unchanged from prior CT. No acute findings. No new lung base nodules. Hepatobiliary: Subcentimeter low-density lesion, segment 2 tiny low-density segment 6. Low-density 4-5 mm lesion seen in segment 7 on the prior CT is not visualized. Current visualized lesions are too small to  characterize but cysts are likely. No other liver abnormality. Normal gallbladder. No bile duct dilation. Pancreas: Unremarkable. No pancreatic ductal dilatation or surrounding inflammatory changes. Spleen: Normal in size without focal abnormality. Adrenals/Urinary Tract: No adrenal masses. Kidneys normal in size, orientation and position. No convincing renal stones. No masses. No hydronephrosis. Normal ureters. Bladder is unremarkable. Stomach/Bowel: There is irregular thickening and adjacent inflammation along the mid to lower sigmoid colon, with there are several diverticula, consistent with acute diverticulitis. There is an apparent intramural fluid collection containing nondependent air, measuring 2.6 x 1.5 x 2.1 cm. No extraluminal air. Inflammatory changes abut the posterosuperior bladder. Remainder of the colon is normal in caliber. No additional wall thickening or inflammation. Stomach is unremarkable. Small bowel is normal in caliber with no wall thickening or inflammation. Normal appendix visualized. Vascular/Lymphatic: Mild aortic atherosclerosis. No enlarged lymph nodes. Reproductive: Mild prostate enlargement, 4.3 cm in greatest transverse dimension, stable from the prior CT. Other: No ascites. No free intraperitoneal air. No abdominal wall hernia. Musculoskeletal: No fracture or acute finding. No osteoblastic or osteolytic lesions. IMPRESSION: 1. Acute sigmoid diverticulitis associated with an intramural abscess, 2.6 cm in greatest dimension. No extraluminal or free air. This portion of the colon was involved by diverticulitis on the prior CT. 2. No other acute abnormality within the abdomen or pelvis. 3. Mild aortic atherosclerosis. Electronically Signed   By: Lajean Manes M.D.   On: 09/23/2018 13:21      Kalman Drape , [facility] 09/25/2018, 9:18 AM  Pager: [phone] Mon-Wed, Friday 7:00am-4:30pm Thurs 7am-11:30am  Consults: [phone]

## 2018-09-25 NOTE — Progress Notes (Signed)
ANTICOAGULATION CONSULT NOTE   Pharmacy Consult:  Heparin Indication: atrial fibrillation  No Known Allergies  Patient Measurements: Height: 5\' 8"  (172.7 cm) Weight: 134 lb (60.8 kg) IBW/kg (Calculated) : 68.4 Heparin Dosing Weight: 61 kg  Vital Signs: Temp: 98.5 F (36.9 C) (02/19 1331) Temp Source: Oral (02/19 1331) BP: 124/72 (02/19 1331) Pulse Rate: 58 (02/19 1331)  Labs: Recent Labs    09/23/18 0948 09/23/18 1531 09/24/18 0153 09/24/18 1907 09/25/18 0142 09/25/18 1319  HGB 15.2 14.2 13.1  --  11.7*  --   HCT 46.1 42.0 38.1*  --  34.5*  --   PLT 166 158 142*  --  143*  --   APTT  --   --   --  47*  --   --   HEPARINUNFRC  --   --   --  0.27* 0.33 0.20*  CREATININE 0.99 0.86 1.07  --   --   --     Estimated Creatinine Clearance: 61.6 mL/min (by C-G formula based on SCr of 1.07 mg/dL).   Assessment: 75 YOM presented with abdominal pain and may require surgery. Pharmacy has been consulted to dose IV heparin for history of Afib while Eliquis is on hold (last dose 2/16 PM).  Heparin level this afternoon is subtherapeutic at 0.2. No infusion issues or s/sx of bleeding noted per RN.  Goal of Therapy:  Heparin level 0.3-0.7 units/ml Monitor platelets by anticoagulation protocol: Yes   Plan:  Increase Heparin gtt to 1150 units/hr Check 6 hour heparin level Daily heparin level and CBC  Jackson Latino, PharmD PGY1 Pharmacy Resident Phone [phone] 09/25/2018     2:39 PM

## 2018-09-25 NOTE — Progress Notes (Signed)
PROGRESS NOTE    BURR SOFFER  PRF:[id number] DOB: [birth year] DOA: 09/23/2018 PCP: [name], MD   Brief Narrative: Adrian [name] is a 63 y.o. male with history of atrial fibrillation, coronary artery disease previous history of diverticulitis. Patient presented with abdominal pain and found to have diverticulitis with evidence of an abscess. Started on IV antibiotics.   Assessment & Plan:   Principal Problem:   Diverticulitis Active Problems:   Hypothyroidism   Atrial flutter (HCC)   CAD (coronary artery disease), native coronary artery   Diverticulitis of large intestine with abscess   Diverticulitis with abscess Acute sigmoid diverticulitis. Started on IV Zosyn. General surgery consulted -General surgery recommendations: advancing diet  History of paroxysmal atrial fibrillation -Continue heparin drip while inpatient  CAD Stable  Paroxysmal atrial fibrillation On Eliquis as an outpatient -Continue bisoprolol and Heparin for now  Hypothyroidism -Continue Synthroid   DVT prophylaxis: Heparin drip Code Status:   Code Status: Full Code Family Communication: None at bedside Disposition Plan: Discharge possibly in 24 hours pending general surgery recommendations   Consultants:   General surgery  Procedures:   None  Antimicrobials:  Zosyn    Subjective: Pain improved but still present.   Objective: Vitals:   09/24/18 2127 09/25/18 0244 09/25/18 0539 09/25/18 1331  BP: 117/71  140/75 124/72  Pulse: (!) 58  (!) 57 (!) 58  Resp: 18  16   Temp: 98.6 F (37 C)  98.9 F (37.2 C) 98.5 F (36.9 C)  TempSrc: Oral  Oral Oral  SpO2: 100%  97% 100%  Weight:      Height:  5\' 8"  (1.727 m)      Intake/Output Summary (Last 24 hours) at 09/25/2018 1436 Last data filed at 09/25/2018 0542 Gross per 24 hour  Intake 3287.29 ml  Output 800 ml  Net 2487.29 ml   Filed Weights   09/24/18 1100  Weight: 60.8 kg    Examination:  General exam: Appears  calm and comfortable Respiratory system: Clear to auscultation. Respiratory effort normal. Cardiovascular system: S1 & S2 heard, RRR. No murmurs, rubs, gallops or clicks. Gastrointestinal system: Abdomen is nondistended, soft and with mostly suprapubic tenderness. No organomegaly or masses felt. Normal bowel sounds heard. Central nervous system: Alert and oriented. No focal neurological deficits. Extremities: No edema. No calf tenderness Skin: No cyanosis. No rashes Psychiatry: Judgement and insight appear normal. Mood & affect appropriate.     Data Reviewed: I have personally reviewed following labs and imaging studies  CBC: Recent Labs  Lab 09/23/18 0948 09/23/18 1531 09/24/18 0153 09/25/18 0142  WBC 5.4 4.9 6.9 6.0  HGB 15.2 14.2 13.1 11.7*  HCT 46.1 42.0 38.1* 34.5*  MCV 94.3 92.3 93.6 93.2  PLT 166 158 142* 935*   Basic Metabolic Panel: Recent Labs  Lab 09/23/18 0948 09/23/18 1531 09/24/18 0153  NA 135  --  139  K 3.8  --  4.0  CL 95*  --  104  CO2 29  --  27  GLUCOSE 110*  --  87  BUN 10  --  10  CREATININE 0.99 0.86 1.07  CALCIUM 8.7*  --  8.2*   GFR: Estimated Creatinine Clearance: 61.6 mL/min (by C-G formula based on SCr of 1.07 mg/dL). Liver Function Tests: Recent Labs  Lab 09/23/18 0948 09/24/18 0153  AST 18 14*  ALT 12 10  ALKPHOS 61 51  BILITOT 0.7 1.0  PROT 7.3 5.9*  ALBUMIN 3.3* 2.5*   Recent Labs  Lab 09/23/18 0948  LIPASE 19    Recent Results (from the past 240 hour(s))  Blood culture (routine x 2)     Status: None (Preliminary result)   Collection Time: 09/23/18  1:54 PM  Result Value Ref Range Status   Specimen Description BLOOD RIGHT FOREARM  Final   Special Requests   Final    BOTTLES DRAWN AEROBIC AND ANAEROBIC Blood Culture adequate volume   Culture   Final    NO GROWTH 2 DAYS Performed at [facility], [place] [address] [place], [place] [address]    Report Status PENDING  Incomplete  Blood culture (routine x 2)      Status: None (Preliminary result)   Collection Time: 09/23/18  2:03 PM  Result Value Ref Range Status   Specimen Description BLOOD LEFT FOREARM  Final   Special Requests   Final    BOTTLES DRAWN AEROBIC AND ANAEROBIC Blood Culture results may not be optimal due to an excessive volume of blood received in culture bottles   Culture   Final    NO GROWTH 2 DAYS Performed at [facility], [place] [address] [place], [place] [address]    Report Status PENDING  Incomplete         Radiology Studies: No results found.      Scheduled Meds: . levothyroxine  125 mcg Oral Daily  . nebivolol  10 mg Oral Daily  . sodium chloride flush  3 mL Intravenous Once   Continuous Infusions: . 0.9 % NaCl with KCl 20 mEq / L 125 mL/hr at 09/25/18 1311  . heparin 1,000 Units/hr (09/25/18 0400)  . piperacillin-tazobactam (ZOSYN)  IV 3.375 g (09/25/18 1310)     LOS: 2 days     Cordelia Poche, MD [facility] 09/25/2018, 2:36 PM  If 7PM-7AM, please contact night-coverage [email or web address]

## 2018-09-25 NOTE — Progress Notes (Signed)
ANTICOAGULATION CONSULT NOTE   Pharmacy Consult:  Heparin Indication: atrial fibrillation  No Known Allergies  Patient Measurements: Height: 5\' 8"  (172.7 cm) Weight: 134 lb (60.8 kg) IBW/kg (Calculated) : 68.4 Heparin Dosing Weight: 60 kg  Vital Signs: Temp: 98.6 F (37 C) (02/19 2054) Temp Source: Oral (02/19 2054) BP: 138/79 (02/19 2054) Pulse Rate: 57 (02/19 2054)  Labs: Recent Labs    09/23/18 0948 09/23/18 1531 09/24/18 0153  09/24/18 1907 09/25/18 0142 09/25/18 1319 09/25/18 2151  HGB 15.2 14.2 13.1  --   --  11.7*  --   --   HCT 46.1 42.0 38.1*  --   --  34.5*  --   --   PLT 166 158 142*  --   --  143*  --   --   APTT  --   --   --   --  47*  --   --   --   HEPARINUNFRC  --   --   --    < > 0.27* 0.33 0.20* 0.37  CREATININE 0.99 0.86 1.07  --   --   --   --   --    < > = values in this interval not displayed.    Estimated Creatinine Clearance: 61.6 mL/min (by C-G formula based on SCr of 1.07 mg/dL).   Assessment: 83 YOM presented with abdominal pain and may require surgery.  Pharmacy has been consulted to dose IV heparin for history of Afib while Eliquis is on hold (last dose 2/16 PM).  No bleeding reported  Heparin came back therapeutic again tonight. We will f/u with level in AM.   Goal of Therapy:  Heparin level 0.3-0.7 units/ml Monitor platelets by anticoagulation protocol: Yes   Plan:  Continue Heparin gtt at 1150 units/hr Daily heparin level and CBC  Onnie Boer, PharmD, BCIDP, AAHIVP, CPP Infectious Disease Pharmacist 09/25/2018 10:20 PM

## 2018-09-25 NOTE — Progress Notes (Signed)
ANTICOAGULATION CONSULT NOTE   Pharmacy Consult:  Heparin Indication: atrial fibrillation  No Known Allergies  Patient Measurements: Weight: 134 lb (60.8 kg) Heparin Dosing Weight: 60 kg  Vital Signs: Temp: 98.6 F (37 C) (02/18 2127) Temp Source: Oral (02/18 2127) BP: 117/71 (02/18 2127) Pulse Rate: 58 (02/18 2127)  Labs: Recent Labs    09/23/18 0948 09/23/18 1531 09/24/18 0153 09/24/18 1907 09/25/18 0142  HGB 15.2 14.2 13.1  --  11.7*  HCT 46.1 42.0 38.1*  --  34.5*  PLT 166 158 142*  --  143*  APTT  --   --   --  47*  --   HEPARINUNFRC  --   --   --  0.27* 0.33  CREATININE 0.99 0.86 1.07  --   --     Estimated Creatinine Clearance: 61.6 mL/min (by C-G formula based on SCr of 1.07 mg/dL).   Assessment: 79 YOM presented with abdominal pain and may require surgery.  Pharmacy has been consulted to dose IV heparin for history of Afib while Eliquis is on hold (last dose 2/16 PM).  No bleeding reported Heparin level 0.33 units/ml  Goal of Therapy:  Heparin level 0.3-0.7 units/ml Monitor platelets by anticoagulation protocol: Yes   Plan:  Continue Heparin gtt at 1000 units/hr Daily heparin level and CBC  Excell Seltzer, PharmD Clinical Pharmacist  09/25/2018, 2:33 AM

## 2018-09-26 DIAGNOSIS — I4891 Unspecified atrial fibrillation: Secondary | ICD-10-CM

## 2018-09-26 LAB — CBC
HCT: 36.7 % — ABNORMAL LOW (ref 39.0–52.0)
HEMOGLOBIN: 12.2 g/dL — AB (ref 13.0–17.0)
MCH: 31.3 pg (ref 26.0–34.0)
MCHC: 33.2 g/dL (ref 30.0–36.0)
MCV: 94.1 fL (ref 80.0–100.0)
Platelets: 151 10*3/uL (ref 150–400)
RBC: 3.9 MIL/uL — ABNORMAL LOW (ref 4.22–5.81)
RDW: 13.4 % (ref 11.5–15.5)
WBC: 5.2 10*3/uL (ref 4.0–10.5)
nRBC: 0 % (ref 0.0–0.2)

## 2018-09-26 LAB — HEPARIN LEVEL (UNFRACTIONATED): Heparin Unfractionated: 0.31 IU/mL (ref 0.30–0.70)

## 2018-09-26 MED ORDER — APIXABAN 5 MG PO TABS
5.0000 mg | ORAL_TABLET | Freq: Two times a day (BID) | ORAL | Status: DC
  Administered 2018-09-26: 5 mg via ORAL
  Filled 2018-09-26: qty 1

## 2018-09-26 MED ORDER — AMOXICILLIN-POT CLAVULANATE 875-125 MG PO TABS: 1.0000 | ORAL_TABLET | Freq: Three times a day (TID) | ORAL | 0 refills | Status: AC

## 2018-09-26 MED ORDER — AMOXICILLIN-POT CLAVULANATE 875-125 MG PO TABS
1.0000 | ORAL_TABLET | Freq: Three times a day (TID) | ORAL | Status: DC
  Administered 2018-09-26: 1 via ORAL
  Filled 2018-09-26: qty 1

## 2018-09-26 NOTE — Discharge Instructions (Addendum)
Diverticulitis  Diverticulitis is infection or inflammation of small pouches (diverticula) in the colon that form due to a condition called diverticulosis. Diverticula can trap stool (feces) and bacteria, causing infection and inflammation. Diverticulitis may cause severe stomach pain and diarrhea. It may lead to tissue damage in the colon that causes bleeding. The diverticula may also burst (rupture) and cause infected stool to enter other areas of the abdomen. Complications of diverticulitis can include:  Bleeding.  Severe infection.  Severe pain.  Rupture (perforation) of the colon.  Blockage (obstruction) of the colon. What are the causes? This condition is caused by stool becoming trapped in the diverticula, which allows bacteria to grow in the diverticula. This leads to inflammation and infection. What increases the risk? You are more likely to develop this condition if:  You have diverticulosis. The risk for diverticulosis increases if: ? You are overweight or obese. ? You use tobacco products. ? You do not get enough exercise.  You eat a diet that does not include enough fiber. High-fiber foods include fruits, vegetables, beans, nuts, and whole grains. What are the signs or symptoms? Symptoms of this condition may include:  Pain and tenderness in the abdomen. The pain is normally located on the left side of the abdomen, but it may occur in other areas.  Fever and chills.  Bloating.  Cramping.  Nausea.  Vomiting.  Changes in bowel routines.  Blood in your stool. How is this diagnosed? This condition is diagnosed based on:  Your medical history.  A physical exam.  Tests to make sure there is nothing else causing your condition. These tests may include: ? Blood tests. ? Urine tests. ? Imaging tests of the abdomen, including X-rays, ultrasounds, MRIs, or CT scans. How is this treated? Most cases of this condition are mild and can be treated at home.  Treatment may include:  Taking over-the-counter pain medicines.  Following a clear liquid diet.  Taking antibiotic medicines by mouth.  Rest. More severe cases may need to be treated at a hospital. Treatment may include:  Not eating or drinking.  Taking prescription pain medicine.  Receiving antibiotic medicines through an IV tube.  Receiving fluids and nutrition through an IV tube.  Surgery. When your condition is under control, your health care provider may recommend that you have a colonoscopy. This is an exam to look at the entire large intestine. During the exam, a lubricated, bendable tube is inserted into the anus and then passed into the rectum, colon, and other parts of the large intestine. A colonoscopy can show how severe your diverticula are and whether something else may be causing your symptoms. Follow these instructions at home: Medicines  Take over-the-counter and prescription medicines only as told by your health care provider. These include fiber supplements, probiotics, and stool softeners.  If you were prescribed an antibiotic medicine, take it as told by your health care provider. Do not stop taking the antibiotic even if you start to feel better.  Do not drive or use heavy machinery while taking prescription pain medicine. General instructions   Follow a full liquid diet or another diet as directed by your health care provider. After your symptoms improve, your health care provider may tell you to change your diet. He or she may recommend that you eat a diet that contains at least 25 g (25 grams) of fiber daily. Fiber makes it easier to pass stool. Healthy sources of fiber include: ? Berries. One cup contains 4-8 grams of  fiber. ? Beans or lentils. One half cup contains 5-8 grams of fiber. ? Green vegetables. One cup contains 4 grams of fiber.  Exercise for at least 30 minutes, 3 times each week. You should exercise hard enough to raise your heart rate and  break a sweat.  Keep all follow-up visits as told by your health care provider. This is important. You may need a colonoscopy. Contact a health care provider if:  Your pain does not improve.  You have a hard time drinking or eating food.  Your bowel movements do not return to normal. Get help right away if:  Your pain gets worse.  Your symptoms do not get better with treatment.  Your symptoms suddenly get worse.  You have a fever.  You vomit more than one time.  You have stools that are bloody, black, or tarry. Summary  Diverticulitis is infection or inflammation of small pouches (diverticula) in the colon that form due to a condition called diverticulosis. Diverticula can trap stool (feces) and bacteria, causing infection and inflammation.  You are at higher risk for this condition if you have diverticulosis and you eat a diet that does not include enough fiber.  Most cases of this condition are mild and can be treated at home. More severe cases may need to be treated at a hospital.  When your condition is under control, your health care provider may recommend that you have an exam called a colonoscopy. This exam can show how severe your diverticula are and whether something else may be causing your symptoms. This information is not intended to replace advice given to you by your health care provider. Make sure you discuss any questions you have with your health care provider. Document Released: 05/03/2005 Document Revised: 08/26/2016 Document Reviewed: 08/26/2016 Elsevier Interactive Patient Education  2019 [employer] Follow for 3-4 weeks after discharge then eat a high fiber diet Fiber is found in fruits, vegetables, whole grains, and beans. Eating a diet low in fiber helps to reduce how often you have bowel movements and how much you produce during a bowel movement. A low-fiber eating plan may help your digestive system heal if:  You have  certain conditions, such as Crohn's disease or diverticulitis.  You recently had radiation therapy on your pelvis or bowel.  You recently had intestinal surgery.  You have a new surgical opening in your abdomen (colostomy or ileostomy).  Your intestine is narrowed (stricture). Your health care provider will determine how long you need to stay on this diet. Your health care provider may recommend that you work with a diet and nutrition specialist (dietitian). What are tips for following this plan? General guidelines  Follow recommendations from your dietitian about how much fiber you should have each day.  Most people on this eating plan should try to eat less than 10 grams (g) of fiber each day. Your daily fiber goal is _________________ g.  Take vitamin and mineral supplements as told by your health care provider or dietitian. Chewable or liquid forms are best when on this eating plan. Reading food labels  Check food labels for the amount of dietary fiber.  Choose foods that have less than 2 grams of fiber in one serving. Cooking  Use white flour and other allowed grains for baking and cooking.  Cook meat using methods that keep it tender, such as braising or poaching.  Cook eggs until the yolk is completely solid.  Cook with healthy  oils, such as olive oil or canola oil. Meal planning   Eat 5-6 small meals throughout the day instead of 3 large meals.  If you are lactose intolerant: ? Choose low-lactose dairy foods. ? Do not eat dairy foods, if told by your dietitian.  Limit fat and oils to less than 8 teaspoons a day.  Eat small portions of desserts. What foods are allowed? The items listed below may not be a complete list. Talk with your dietitian about what dietary choices are best for you. Grains All bread and crackers made with white flour. Waffles, pancakes, and [place] toast. Bagels. Pretzels. Melba toast, zwieback, and matzoh. Cooked and dried cereals that do  not contain whole grains, added fiber, seeds, or dried fruit. CornmealDomenick Gong. Hot and cold cereals made with refined corn, wheat, rice, or oats. Plain pasta and noodles. White rice. Vegetables Well-cooked or canned vegetables without skin, seeds, or stems. Cooked potatoes without skins. Vegetable juice. Fruits Soft-cooked or canned fruits without skin and seeds. Peeled ripe banana. Applesauce. Fruit juice without pulp. Meats and other protein foods Ground meat. Tender cuts of meat or poultry. Eggs. Fish, seafood, and shellfish. Smooth nut butters. Tofu. Dairy All milk products and drinks. Lactose-free milks, including rice, soy, and almond milks. Yogurt without fruit, nuts, chocolate, or granola mix-ins. Sour cream. Cottage cheese. Cheese. Beverages Decaf coffee. Fruit and vegetable juices or smoothies (in small amounts, with no pulp or skins, and with fruits from allowed list). Sports drinks. Herbal tea. Fats and oils Olive oil, canola oil, sunflower oil, flaxseed oil, and grapeseed oil. Mayonnaise. Cream cheese. Margarine. Butter. Sweets and desserts Plain cakes and cookies. Cream pies and pies made with allowed fruits. Pudding. Custard. Fruit gelatin. Sherbet. Popsicles. Ice cream without nuts. Plain hard candy. Honey. Jelly. Molasses. Syrups, including chocolate syrup. Chocolate. Marshmallows. Gumdrops. Seasoning and other foods Bouillon. Broth. Cream soups made from allowed foods. Strained soup. Casseroles made with allowed foods. Ketchup. Mild mustard. Mild salad dressings. Plain gravies. Vinegar. Spices in moderation. Salt. Sugar. What foods are not allowed? The items listed below may not be a complete list. Talk with your dietitian about what dietary choices are best for you. Grains Whole wheat and whole grain breads and crackers. Multigrain breads and crackers. Rye bread. Whole grain or multigrain cereals. Cereals with nuts, raisins, or coconut. Bran. Coarse wheat cereals. Granola.  High-fiber cereals. Cornmeal or corn bread. Whole grain pasta. Wild or brown rice. Quinoa. Popcorn. Buckwheat. Wheat germ. Vegetables Potato skins. Raw or undercooked vegetables. All beans and bean sprouts. Cooked greens. Corn. Peas. Cabbage. Beets. Broccoli. Brussels sprouts. Cauliflower. Mushrooms. Onions. Peppers. Parsnips. Okra. Sauerkraut. Fruit Raw or dried fruit. Berries. Fruit juice with pulp. Prune juice. Meats and other protein foods Tough, fibrous meats with gristle. Fatty meat. Poultry with skin. Fried meat, Sales executive, or fish. Deli or lunch meats. Sausage, bacon, and hot dogs. Nuts and chunky nut butter. Dried peas, beans, and lentils. Dairy Yogurt with fruit, nuts, chocolate, or granola mix-ins. Beverages Caffeinated coffee and teas. Fats and oils Avocado. Coconut. Sweets and desserts Desserts, cookies, or candies that contain nuts or coconut. Dried fruit. Jams and preserves with seeds. Marmalade. Any dessert made with fruits or grains that are not allowed. Seasoning and other foods Corn tortilla chips. Soups made with vegetables or grains that are not allowed. Relish. Horseradish. Angie Fava. Olives. Summary  Most people on a low-fiber eating plan should eat less than 10 grams of fiber a day. Follow recommendations from your dietitian about how  much fiber you should have each day.  Always check food labels to see the dietary fiber content of packaged foods. In general, a low-fiber food will have fewer than 2 grams of fiber per serving.  In general, try to avoid whole grains, raw fruits and vegetables, dried fruit, tough cuts of meat, nuts, and seeds.  Take a vitamin and mineral supplement as told by your health care provider or dietitian. This information is not intended to replace advice given to you by your health care provider. Make sure you discuss any questions you have with your health care provider. Document Released: 01/13/2002 Document Revised: 09/26/2016 Document  Reviewed: 09/26/2016 Elsevier Interactive Patient Education  2019 [place] on my medicine - ELIQUIS (apixaban)  This medication education was reviewed with me or my healthcare representative as part of my discharge preparation.    Why was Eliquis prescribed for you? Eliquis was prescribed for you to reduce the risk of a blood clot forming that can cause a stroke if you have a medical condition called atrial fibrillation (a type of irregular heartbeat).  What do You need to know about Eliquis ? Take your Eliquis TWICE DAILY - one tablet in the morning and one tablet in the evening with or without food. If you have difficulty swallowing the tablet whole please discuss with your pharmacist how to take the medication safely.  Take Eliquis exactly as prescribed by your doctor and DO NOT stop taking Eliquis without talking to the doctor who prescribed the medication.  Stopping may increase your risk of developing a stroke.  Refill your prescription before you run out.  After discharge, you should have regular check-up appointments with your healthcare provider that is prescribing your Eliquis.  In the future your dose may need to be changed if your kidney function or weight changes by a significant amount or as you get older.  What do you do if you miss a dose? If you miss a dose, take it as soon as you remember on the same day and resume taking twice daily.  Do not take more than one dose of ELIQUIS at the same time to make up a missed dose.  Important Safety Information A possible side effect of Eliquis is bleeding. You should call your healthcare provider right away if you experience any of the following: ? Bleeding from an injury or your nose that does not stop. ? Unusual colored urine (red or dark brown) or unusual colored stools (red or black). ? Unusual bruising for unknown reasons. ? A serious fall or if you hit your head (even if there is no  bleeding).  Some medicines may interact with Eliquis and might increase your risk of bleeding or clotting while on Eliquis. To help avoid this, consult your healthcare provider or pharmacist prior to using any new prescription or non-prescription medications, including herbals, vitamins, non-steroidal anti-inflammatory drugs (NSAIDs) and supplements.  This website has more information on Eliquis (apixaban): http://[email or web address].[email or web address]

## 2018-09-26 NOTE — Discharge Summary (Signed)
Physician Discharge Summary  TAWFIQ FAVILA GNO:[id number] DOB: [birth year] DOA: 09/23/2018  PCP: [name], MD  Admit date: 09/23/2018 Discharge date: 09/26/2018  Admitted From: Home Disposition: Home  Recommendations for Outpatient Follow-up:  1. Follow up with PCP in 1 week 2. Please obtain BMP/CBC in one week 3. Follow up with GI in 6 weeks 4. Follow-up with General surgery in 8 weeks 5. Please follow up on the following pending results: Blood cultures (final result)  Home Health: None Equipment/Devices: None  Discharge Condition: Stable CODE STATUS: Full code Diet recommendation: Heart healthy   Brief/Interim Summary:  Admission HPI written by Vashti Hey, MD   History of Present Illness:  Adrian [name] is an 63 y.o. male with past medical history significant for atrial fibrillation, coronary artery disease and previous history of diverticulitis who was in his usual state of good health until 5-6 days prior to admission when he noted onset of lower abdominal pain. Patient also had nonbloody diarrhea during this time and attributed the pain to the diarrhea. He continued to go to work. Patient did note worsening abdominal pain over the past 5 days and although he did work yesterday he said he was unable to sleep for the past 2 nights secondary to pain. Patient now presents for evaluation.  Patient denies frank fevers however does admit to feeling "cold all day today I don't get it". She does admit to nausea but denies any vomiting. He does have diarrhea 2-3 times a day as noted previously. No chest pain shortness of breath or cough. No dizziness syncope or presyncope.   ED Course:  The patient underwent a CT scan which showed acute sigmoid diverticulitis with an intramural abscess of 2.6 cm without free air. Patient was seen by general surgery and started on Zosyn. He is now admitted [facility] for medical management.   Hospital  course:  Diverticulitis with abscess Acute sigmoid diverticulitis. Started on IV Zosyn. General surgery consulted on admission. Recommendations for continued antibiotics and conservative management. patient improved and tolerated advancement of diet. Patient to follow-up with GI and general surgery as an outpatient. Discharge on Augmentin for total of 14 day treatment  History of paroxysmal atrial fibrillation Continue heparin drip while inpatient  CAD Stable  Paroxysmal atrial fibrillation On Eliquis as an outpatient. Heparin while inpatient. Continue bisoprolol and Eliquis.  Hypothyroidism Continue Synthroid  Discharge Diagnoses:  Principal Problem:   Diverticulitis Active Problems:   Hypothyroidism   Atrial flutter (HCC)   CAD (coronary artery disease), native coronary artery   Diverticulitis of large intestine with abscess    Discharge Instructions   Allergies as of 09/26/2018   No Known Allergies     Medication List    TAKE these medications   amoxicillin-clavulanate 875-125 MG tablet Commonly known as:  AUGMENTIN Take 1 tablet by mouth every 8 (eight) hours for 11 days.   apixaban 5 MG Tabs tablet Commonly known as:  ELIQUIS Take 1 tablet (5 mg total) by mouth 2 (two) times daily.   atorvastatin 40 MG tablet Commonly known as:  LIPITOR Take 1 tablet (40 mg total) by mouth daily. Please keep upcoming appt in September for future refills. Thank you What changed:  additional instructions   levothyroxine 125 MCG tablet Commonly known as:  SYNTHROID, LEVOTHROID Take 1 tablet (125 mcg total) by mouth daily.   nebivolol 10 MG tablet Commonly known as:  BYSTOLIC Take 1 tablet (10 mg total) by mouth daily.  OVER THE COUNTER MEDICATION Take 1 tablet by mouth as needed (for cold/pain). Alka seltzer      Follow-up Information    Irene Shipper, MD. Schedule an appointment as soon as possible for a visit in 6 week(s).   Specialty:  Gastroenterology Why:   you need a colonoscopy in 6-8 weeks after discharge Contact information: [address] [address] [phone]        Ralene Ok, MD. Schedule an appointment as soon as possible for a visit in 8 week(s).   Specialty:  General Surgery Why:  after colonoscopy to discuss possible elective colon resection  Contact information: [address] [place] [address] [phone]        [name], MD. Schedule an appointment as soon as possible for a visit in 1 week(s).   Specialty:  Family Medicine Contact information: [place] [address] [phone]          No Known Allergies  Consultations:  General surgery   Procedures/Studies: Ct Abdomen Pelvis W Contrast  Result Date: 09/23/2018 CLINICAL DATA:  Pt c/o extreme pain in lower abdomin and not being able to urinate x 1 week EXAM: CT ABDOMEN AND PELVIS WITH CONTRAST TECHNIQUE: Multidetector CT imaging of the abdomen and pelvis was performed using the standard protocol following bolus administration of intravenous contrast. CONTRAST:  124mL OMNIPAQUE IOHEXOL 300 MG/ML  SOLN COMPARISON:  10/12/2015. FINDINGS: Lower chest: Stable benign 4 mm left lower lobe nodule, unchanged from prior CT. No acute findings. No new lung base nodules. Hepatobiliary: Subcentimeter low-density lesion, segment 2 tiny low-density segment 6. Low-density 4-5 mm lesion seen in segment 7 on the prior CT is not visualized. Current visualized lesions are too small to characterize but cysts are likely. No other liver abnormality. Normal gallbladder. No bile duct dilation. Pancreas: Unremarkable. No pancreatic ductal dilatation or surrounding inflammatory changes. Spleen: Normal in size without focal abnormality. Adrenals/Urinary Tract: No adrenal masses. Kidneys normal in size, orientation and position. No convincing renal stones. No masses. No hydronephrosis. Normal ureters. Bladder is unremarkable.  Stomach/Bowel: There is irregular thickening and adjacent inflammation along the mid to lower sigmoid colon, with there are several diverticula, consistent with acute diverticulitis. There is an apparent intramural fluid collection containing nondependent air, measuring 2.6 x 1.5 x 2.1 cm. No extraluminal air. Inflammatory changes abut the posterosuperior bladder. Remainder of the colon is normal in caliber. No additional wall thickening or inflammation. Stomach is unremarkable. Small bowel is normal in caliber with no wall thickening or inflammation. Normal appendix visualized. Vascular/Lymphatic: Mild aortic atherosclerosis. No enlarged lymph nodes. Reproductive: Mild prostate enlargement, 4.3 cm in greatest transverse dimension, stable from the prior CT. Other: No ascites. No free intraperitoneal air. No abdominal wall hernia. Musculoskeletal: No fracture or acute finding. No osteoblastic or osteolytic lesions. IMPRESSION: 1. Acute sigmoid diverticulitis associated with an intramural abscess, 2.6 cm in greatest dimension. No extraluminal or free air. This portion of the colon was involved by diverticulitis on the prior CT. 2. No other acute abnormality within the abdomen or pelvis. 3. Mild aortic atherosclerosis. Electronically Signed   By: Lajean Manes M.D.   On: 09/23/2018 13:21      Subjective: Abdominal pain improved. Small bowel movement today.  Discharge Exam: Vitals:   09/25/18 2054 09/26/18 0627  BP: 138/79 132/76  Pulse: (!) 57 (!) 56  Resp: 18 18  Temp: 98.6 F (37 C) 98.7 F (37.1 C)  SpO2: 99%  95%   Vitals:   09/25/18 0539 09/25/18 1331 09/25/18 2054 09/26/18 0627  BP: 140/75 124/72 138/79 132/76  Pulse: (!) 57 (!) 58 (!) 57 (!) 56  Resp: 16  18 18   Temp: 98.9 F (37.2 C) 98.5 F (36.9 C) 98.6 F (37 C) 98.7 F (37.1 C)  TempSrc: Oral Oral Oral Oral  SpO2: 97% 100% 99% 95%  Weight:      Height:        General: Pt is alert, awake, not in acute  distress Cardiovascular: RRR, S1/S2 +, no rubs, no gallops Respiratory: CTA bilaterally, no wheezing, no rhonchi Abdominal: Soft, NT, ND, bowel sounds + Extremities: no edema, no cyanosis    The results of significant diagnostics from this hospitalization (including imaging, microbiology, ancillary and laboratory) are listed below for reference.     Microbiology: Recent Results (from the past 240 hour(s))  Blood culture (routine x 2)     Status: None (Preliminary result)   Collection Time: 09/23/18  1:54 PM  Result Value Ref Range Status   Specimen Description BLOOD RIGHT FOREARM  Final   Special Requests   Final    BOTTLES DRAWN AEROBIC AND ANAEROBIC Blood Culture adequate volume   Culture   Final    NO GROWTH 3 DAYS Performed at [facility], [address], [place], [place] [address]    Report Status PENDING  Incomplete  Blood culture (routine x 2)     Status: None (Preliminary result)   Collection Time: 09/23/18  2:03 PM  Result Value Ref Range Status   Specimen Description BLOOD LEFT FOREARM  Final   Special Requests   Final    BOTTLES DRAWN AEROBIC AND ANAEROBIC Blood Culture results may not be optimal due to an excessive volume of blood received in culture bottles   Culture   Final    NO GROWTH 3 DAYS Performed at [facility], [place] [address] [place], [place] [address]    Report Status PENDING  Incomplete     Labs: BNP (last 3 results) No results for input(s): BNP in the last 8760 hours. Basic Metabolic Panel: Recent Labs  Lab 09/23/18 0948 09/23/18 1531 09/24/18 0153  NA 135  --  139  K 3.8  --  4.0  CL 95*  --  104  CO2 29  --  27  GLUCOSE 110*  --  87  BUN 10  --  10  CREATININE 0.99 0.86 1.07  CALCIUM 8.7*  --  8.2*   Liver Function Tests: Recent Labs  Lab 09/23/18 0948 09/24/18 0153  AST 18 14*  ALT 12 10  ALKPHOS 61 51  BILITOT 0.7 1.0  PROT 7.3 5.9*  ALBUMIN 3.3* 2.5*   Recent Labs  Lab 09/23/18 0948  LIPASE 19   No  results for input(s): AMMONIA in the last 168 hours. CBC: Recent Labs  Lab 09/23/18 0948 09/23/18 1531 09/24/18 0153 09/25/18 0142 09/26/18 0232  WBC 5.4 4.9 6.9 6.0 5.2  HGB 15.2 14.2 13.1 11.7* 12.2*  HCT 46.1 42.0 38.1* 34.5* 36.7*  MCV 94.3 92.3 93.6 93.2 94.1  PLT 166 158 142* 143* 151   Cardiac Enzymes: No results for input(s): CKTOTAL, CKMB, CKMBINDEX, TROPONINI in the last 168 hours. BNP: Invalid input(s): POCBNP CBG: No results for input(s): GLUCAP in the last 168 hours. D-Dimer No results for input(s): DDIMER in the last 72 hours. Hgb A1c No results for input(s): HGBA1C in the last 72 hours. Lipid Profile  No results for input(s): CHOL, HDL, LDLCALC, TRIG, CHOLHDL, LDLDIRECT in the last 72 hours. Thyroid function studies No results for input(s): TSH, T4TOTAL, T3FREE, THYROIDAB in the last 72 hours.  Invalid input(s): FREET3 Anemia work up No results for input(s): VITAMINB12, FOLATE, FERRITIN, TIBC, IRON, RETICCTPCT in the last 72 hours. Urinalysis    Component Value Date/Time   COLORURINE YELLOW 09/23/2018 1324   APPEARANCEUR CLEAR 09/23/2018 1324   LABSPEC 1.034 (H) 09/23/2018 1324   PHURINE 8.0 09/23/2018 1324   GLUCOSEU NEGATIVE 09/23/2018 1324   HGBUR SMALL (A) 09/23/2018 1324   BILIRUBINUR NEGATIVE 09/23/2018 1324   BILIRUBINUR small (A) 10/12/2015 1003   BILIRUBINUR 2+ 06/19/2014 0949   KETONESUR 80 (A) 09/23/2018 1324   PROTEINUR 30 (A) 09/23/2018 1324   UROBILINOGEN 0.2 10/12/2015 1003   NITRITE NEGATIVE 09/23/2018 1324   LEUKOCYTESUR NEGATIVE 09/23/2018 1324   Sepsis Labs Invalid input(s): PROCALCITONIN,  WBC,  LACTICIDVEN Microbiology Recent Results (from the past 240 hour(s))  Blood culture (routine x 2)     Status: None (Preliminary result)   Collection Time: 09/23/18  1:54 PM  Result Value Ref Range Status   Specimen Description BLOOD RIGHT FOREARM  Final   Special Requests   Final    BOTTLES DRAWN AEROBIC AND ANAEROBIC Blood Culture  adequate volume   Culture   Final    NO GROWTH 3 DAYS Performed at [facility], [place] [address], [place], [place] [address]    Report Status PENDING  Incomplete  Blood culture (routine x 2)     Status: None (Preliminary result)   Collection Time: 09/23/18  2:03 PM  Result Value Ref Range Status   Specimen Description BLOOD LEFT FOREARM  Final   Special Requests   Final    BOTTLES DRAWN AEROBIC AND ANAEROBIC Blood Culture results may not be optimal due to an excessive volume of blood received in culture bottles   Culture   Final    NO GROWTH 3 DAYS Performed at [facility], [place] [address] [place], [place] [address]    Report Status PENDING  Incomplete     SIGNED:   Cordelia Poche, MD [facility] 09/26/2018, 12:08 PM

## 2018-09-26 NOTE — Progress Notes (Signed)
[facility]/Trauma Progress Note      Assessment/Plan Hx of tonsil cancer S/P radiation and chemo Hx of G tube Hx of colon polyps A fib on Eliquis Tobacco abuse - discussed cessation   Diverticulitis with intramural abscess - 2nd episode, 1st in 2017 - hopefully this will resolve without need for surgical intervention - pain improved, PO pain medicine - Advance diet   YHC:WCBJ diet VTE: SCD's,okay to restart eliquis SE:GBTDV Foley:none Follow up:TBD, will need colonoscopy in 6-8 weeks with Dr. Henrene Pastor  Plan: pt okay for discharge from a surgical standpoint if he tolerates soft diet today. He will need 14 days total of abx. He will need a colonoscopy in 6-8 weeks. Follow up with our office after that.    LOS: 3 days    Subjective: CC: diverticulitis   Very mild, intermittent, abdominal pain. No nausea or vomiting, fever or chills. No BM. Having flatus. Feels bloated. Feels better overall.   Objective: Vital signs in last 24 hours: Temp:  [98.5 F (36.9 C)-98.7 F (37.1 C)] 98.7 F (37.1 C) (02/20 0627) Pulse Rate:  [56-58] 56 (02/20 0627) Resp:  [18] 18 (02/20 0627) BP: (124-138)/(72-79) 132/76 (02/20 0627) SpO2:  [95 %-100 %] 95 % (02/20 0627) Last BM Date: 09/18/18  Intake/Output from previous day: 02/19 0701 - 02/20 0700 In: -  Out: 1900 [Urine:1900] Intake/Output this shift: No intake/output data recorded.  PE: Gen: Alert, NAD, pleasant, cooperative Pulm:Rate andeffort normal Abd: Soft, ND, +BS,no TTP, no peritonitis Skin: no rashes noted, warm and dry   Anti-infectives: Anti-infectives (From admission, onward)   Start     Dose/Rate Route Frequency Ordered Stop   09/23/18 2200  piperacillin-tazobactam (ZOSYN) IVPB 3.375 g     3.375 g 12.5 mL/hr over 240 Minutes Intravenous Every 8 hours 09/23/18 1507     09/23/18 1345  piperacillin-tazobactam (ZOSYN) IVPB 3.375 g     3.375 g 100 mL/hr over 30 Minutes Intravenous  Once  09/23/18 1334 09/23/18 1435      Lab Results:  Recent Labs    09/25/18 0142 09/26/18 0232  WBC 6.0 5.2  HGB 11.7* 12.2*  HCT 34.5* 36.7*  PLT 143* 151   BMET Recent Labs    09/23/18 0948 09/23/18 1531 09/24/18 0153  NA 135  --  139  K 3.8  --  4.0  CL 95*  --  104  CO2 29  --  27  GLUCOSE 110*  --  87  BUN 10  --  10  CREATININE 0.99 0.86 1.07  CALCIUM 8.7*  --  8.2*   PT/INR No results for input(s): LABPROT, INR in the last 72 hours. CMP     Component Value Date/Time   NA 139 09/24/2018 0153   NA 134 01/23/2017 0826   K 4.0 09/24/2018 0153   CL 104 09/24/2018 0153   CO2 27 09/24/2018 0153   GLUCOSE 87 09/24/2018 0153   BUN 10 09/24/2018 0153   BUN 6 (L) 01/23/2017 0826   CREATININE 1.07 09/24/2018 0153   CREATININE 0.88 11/22/2015 1137   CALCIUM 8.2 (L) 09/24/2018 0153   PROT 5.9 (L) 09/24/2018 0153   PROT 7.1 03/09/2017 1032   ALBUMIN 2.5 (L) 09/24/2018 0153   ALBUMIN 4.4 03/09/2017 1032   AST 14 (L) 09/24/2018 0153   ALT 10 09/24/2018 0153   ALKPHOS 51 09/24/2018 0153   BILITOT 1.0 09/24/2018 0153   BILITOT 0.4 03/09/2017 1032   GFRNONAA >60 09/24/2018 0153   GFRAA >60  09/24/2018 0153   Lipase     Component Value Date/Time   LIPASE 19 09/23/2018 0948    Studies/Results: No results found.    Kalman Drape , [facility] Surgery 09/26/2018, 8:48 AM  Pager: [phone] Mon-Wed, Friday 7:00am-4:30pm Thurs 7am-11:30am  Consults: [phone]

## 2018-09-28 LAB — CULTURE, BLOOD (ROUTINE X 2)
Culture: NO GROWTH
Culture: NO GROWTH
Special Requests: ADEQUATE

## 2018-11-20 ENCOUNTER — Ambulatory Visit (INDEPENDENT_AMBULATORY_CARE_PROVIDER_SITE_OTHER): Payer: BLUE CROSS/BLUE SHIELD | Admitting: Family Medicine

## 2018-11-20 ENCOUNTER — Other Ambulatory Visit: Payer: Self-pay

## 2018-11-20 DIAGNOSIS — R300 Dysuria: Secondary | ICD-10-CM | POA: Diagnosis not present

## 2018-11-20 LAB — POCT URINALYSIS DIPSTICK
Bilirubin, UA: NEGATIVE
Glucose, UA: NEGATIVE
Ketones, UA: NEGATIVE
Leukocytes, UA: NEGATIVE
Nitrite, UA: NEGATIVE
Protein, UA: POSITIVE — AB
Spec Grav, UA: 1.01 (ref 1.010–1.025)
Urobilinogen, UA: 0.2 E.U./dL
pH, UA: 7 (ref 5.0–8.0)

## 2018-11-20 LAB — URINALYSIS, MICROSCOPIC ONLY

## 2018-11-20 NOTE — Addendum Note (Signed)
Addended by: Gwynne Edinger on: 11/20/2018 09:38 AM   Modules accepted: Orders

## 2018-11-20 NOTE — Progress Notes (Signed)
Patient ID: Adrian [name], male   DOB: 1956/03/02, 63 y.o.   MRN: [id number]  Virtual Visit via Video Note  I connected with Adrian [name] on 11/20/18 at  8:30 AM EDT by a video enabled telemedicine application and verified that I am speaking with the correct person using two identifiers.  Location patient: home Location provider:work or home office Persons participating in the virtual visit: patient, provider  I discussed the limitations of evaluation and management by telemedicine and the availability of in person appointments. The patient expressed understanding and agreed to proceed.   HPI:  Patient called with about 3-week history of some burning with urination.  He states he is getting up about 3 times per night which is not typical for him.  He is also having fairly low volume when he urinates.  No history of BPH.  No history of UTI.  Symptoms started about 3 weeks ago.  Has had some nonspecific fatigue and some occasional chills but no documented fever.  No penile discharge.  Monogamous.  Patient did relate flareup with diverticulitis back in early February and was treated with antibiotics and those symptoms have improved.  He had a friend who had some amoxicillin about a week ago when he took that for 7 days with no improvement in symptoms.  No history of STD.  Denies any nausea or vomiting.  No flank pain.   ROS: See pertinent positives and negatives per HPI.  Past Medical History:  Diagnosis Date  . CAD (coronary artery disease), native coronary artery 12/06/2015   a. 10/2015 MV: EF  37%, reversible defect inferior apex, intermediate risk findings; b. 10/2015 Cath: 20% mid RCA.  Marland Kitchen History of chemotherapy 2005   Cisplatin  . Hypothyroidism   . NICM (nonischemic cardiomyopathy) ([place]) 10/14/2015   a. Tachy mediated?;  b. Echo 3/17 - Mild concentric LVH, EF 30-35%, anteroseptal, anterior, anterolateral, apical anterior, lateral hypokinesis, trivial MR, mild to moderately reduced RVSF; c. LHC  3/17 - mRCA 20%  . Paroxysmal atrial flutter ([place])    a. TEE 3/17 with ? LAA clot-->s/p TEE/DCCV 11/24/2015  . Radiation NOv.3,2005-Dec. 15, 2005   6810 cGy in 30 fractions  . Tonsil cancer ([place]) 2005    Past Surgical History:  Procedure Laterality Date  . CARDIAC CATHETERIZATION N/A 10/18/2015   Procedure: Left Heart Cath and Coronary Angiography;  Surgeon: Burnell Blanks, MD;  Location: [facility];  Service: Cardiovascular;  Laterality: N/A;  . CARDIOVERSION N/A 11/24/2015   Procedure: CARDIOVERSION;  Surgeon: Josue Hector, MD;  Location: [facility] ENDOSCOPY;  Service: Cardiovascular;  Laterality: N/A;  . LAMINECTOMY     C5/placement of steel plate  . NECK SURGERY  2003   replaced disk  . TEE WITHOUT CARDIOVERSION N/A 10/15/2015   Procedure: TRANSESOPHAGEAL ECHOCARDIOGRAM (TEE);  Surgeon: Larey Dresser, MD;  Location: [facility];  Service: Cardiovascular;  Laterality: N/A;  . TEE WITHOUT CARDIOVERSION N/A 11/24/2015   Procedure: TRANSESOPHAGEAL ECHOCARDIOGRAM (TEE);  Surgeon: Josue Hector, MD;  Location: [facility] ENDOSCOPY;  Service: Cardiovascular;  Laterality: N/A;    Family History  Problem Relation Age of Onset  . Prostate cancer Father   . Colon cancer Father   . Skin cancer Mother   . Cancer Brother     SOCIAL HX: History of smoking.   Current Outpatient Medications:  .  apixaban (ELIQUIS) 5 MG TABS tablet, Take 1 tablet (5 mg total) by mouth 2 (two) times daily., Disp: 180 tablet, Rfl: 1 .  atorvastatin (LIPITOR) 40 MG tablet, Take 1 tablet (40 mg total) by mouth daily. Please keep upcoming appt in September for future refills. Thank you (Patient taking differently: Take 40 mg by mouth daily. ), Disp: 30 tablet, Rfl: 1 .  levothyroxine (SYNTHROID, LEVOTHROID) 125 MCG tablet, Take 1 tablet (125 mcg total) by mouth daily., Disp: 90 tablet, Rfl: 3 .  nebivolol (BYSTOLIC) 10 MG tablet, Take 1 tablet (10 mg total) by mouth daily., Disp: 90 tablet, Rfl: 3 .  OVER THE  COUNTER MEDICATION, Take 1 tablet by mouth as needed (for cold/pain). Alka seltzer, Disp: , Rfl:   EXAM:  VITALS per patient if applicable:  GENERAL: alert, oriented, appears well and in no acute distress  HEENT: atraumatic, conjunttiva clear, no obvious abnormalities on inspection of external nose and ears  NECK: normal movements of the head and neck  LUNGS: on inspection no signs of respiratory distress, breathing rate appears normal, no obvious gross SOB, gasping or wheezing  CV: no obvious cyanosis  MS: moves all visible extremities without noticeable abnormality  PSYCH/NEURO: pleasant and cooperative, no obvious depression or anxiety, speech and thought processing grossly intact  ASSESSMENT AND PLAN:  Discussed the following assessment and plan:  Dysuria -rule out UTI  -Patient will swing by later this morning for urinalysis and culture if indicated -Drink plenty of fluids     I discussed the assessment and treatment plan with the patient. The patient was provided an opportunity to ask questions and all were answered. The patient agreed with the plan and demonstrated an understanding of the instructions.   The patient was advised to call back or seek an in-person evaluation if the symptoms worsen or if the condition fails to improve as anticipated.  Carolann Littler, MD

## 2018-11-20 NOTE — Addendum Note (Signed)
Addended by: Anibal Henderson on: 11/20/2018 03:07 PM   Modules accepted: Orders

## 2018-11-21 LAB — URINE CULTURE
MICRO NUMBER:: 397530
Result:: NO GROWTH
SPECIMEN QUALITY:: ADEQUATE

## 2018-11-26 ENCOUNTER — Other Ambulatory Visit: Payer: Self-pay

## 2018-11-26 MED ORDER — CIPROFLOXACIN HCL 500 MG PO TABS: 500.0000 mg | ORAL_TABLET | Freq: Two times a day (BID) | ORAL | 0 refills | Status: DC

## 2018-11-29 ENCOUNTER — Ambulatory Visit: Payer: Self-pay | Admitting: *Deleted

## 2018-11-29 ENCOUNTER — Telehealth: Payer: Self-pay | Admitting: Family Medicine

## 2018-11-29 ENCOUNTER — Inpatient Hospital Stay (HOSPITAL_COMMUNITY): Payer: BLUE CROSS/BLUE SHIELD

## 2018-11-29 ENCOUNTER — Inpatient Hospital Stay (HOSPITAL_COMMUNITY)
Admission: EM | Admit: 2018-11-29 | Discharge: 2018-12-04 | DRG: 392 | Disposition: A | Discharge status: Home / Self Care | Payer: BLUE CROSS/BLUE SHIELD | Source: Ambulatory Visit | Attending: Internal Medicine | Admitting: Internal Medicine

## 2018-11-29 ENCOUNTER — Other Ambulatory Visit: Payer: Self-pay

## 2018-11-29 ENCOUNTER — Encounter (HOSPITAL_COMMUNITY): Payer: Self-pay | Admitting: Emergency Medicine

## 2018-11-29 DIAGNOSIS — Z923 Personal history of irradiation: Secondary | ICD-10-CM | POA: Diagnosis not present

## 2018-11-29 DIAGNOSIS — Z85818 Personal history of malignant neoplasm of other sites of lip, oral cavity, and pharynx: Secondary | ICD-10-CM

## 2018-11-29 DIAGNOSIS — Z808 Family history of malignant neoplasm of other organs or systems: Secondary | ICD-10-CM | POA: Diagnosis not present

## 2018-11-29 DIAGNOSIS — E871 Hypo-osmolality and hyponatremia: Secondary | ICD-10-CM | POA: Diagnosis present

## 2018-11-29 DIAGNOSIS — R8271 Bacteriuria: Secondary | ICD-10-CM | POA: Diagnosis not present

## 2018-11-29 DIAGNOSIS — E039 Hypothyroidism, unspecified: Secondary | ICD-10-CM | POA: Diagnosis present

## 2018-11-29 DIAGNOSIS — Z79899 Other long term (current) drug therapy: Secondary | ICD-10-CM

## 2018-11-29 DIAGNOSIS — Z79890 Hormone replacement therapy (postmenopausal): Secondary | ICD-10-CM

## 2018-11-29 DIAGNOSIS — I428 Other cardiomyopathies: Secondary | ICD-10-CM | POA: Diagnosis present

## 2018-11-29 DIAGNOSIS — F17210 Nicotine dependence, cigarettes, uncomplicated: Secondary | ICD-10-CM | POA: Diagnosis present

## 2018-11-29 DIAGNOSIS — Z800 Family history of malignant neoplasm of digestive organs: Secondary | ICD-10-CM | POA: Diagnosis not present

## 2018-11-29 DIAGNOSIS — Z7901 Long term (current) use of anticoagulants: Secondary | ICD-10-CM

## 2018-11-29 DIAGNOSIS — I480 Paroxysmal atrial fibrillation: Secondary | ICD-10-CM

## 2018-11-29 DIAGNOSIS — B9620 Unspecified Escherichia coli [E. coli] as the cause of diseases classified elsewhere: Secondary | ICD-10-CM | POA: Diagnosis present

## 2018-11-29 DIAGNOSIS — K5720 Diverticulitis of large intestine with perforation and abscess without bleeding: Secondary | ICD-10-CM | POA: Diagnosis present

## 2018-11-29 DIAGNOSIS — K5730 Diverticulosis of large intestine without perforation or abscess without bleeding: Secondary | ICD-10-CM | POA: Diagnosis present

## 2018-11-29 DIAGNOSIS — Z8042 Family history of malignant neoplasm of prostate: Secondary | ICD-10-CM | POA: Diagnosis not present

## 2018-11-29 DIAGNOSIS — N322 Vesical fistula, not elsewhere classified: Secondary | ICD-10-CM | POA: Diagnosis present

## 2018-11-29 DIAGNOSIS — I2510 Atherosclerotic heart disease of native coronary artery without angina pectoris: Secondary | ICD-10-CM | POA: Diagnosis present

## 2018-11-29 DIAGNOSIS — Z9221 Personal history of antineoplastic chemotherapy: Secondary | ICD-10-CM

## 2018-11-29 DIAGNOSIS — I482 Chronic atrial fibrillation, unspecified: Secondary | ICD-10-CM

## 2018-11-29 DIAGNOSIS — N390 Urinary tract infection, site not specified: Secondary | ICD-10-CM

## 2018-11-29 HISTORY — DX: Paroxysmal atrial fibrillation: I48.0

## 2018-11-29 LAB — CBC WITH DIFFERENTIAL/PLATELET
Abs Immature Granulocytes: 0.04 10*3/uL (ref 0.00–0.07)
Basophils Absolute: 0 10*3/uL (ref 0.0–0.1)
Basophils Relative: 1 %
Eosinophils Absolute: 0.1 10*3/uL (ref 0.0–0.5)
Eosinophils Relative: 1 %
HCT: 38.8 % — ABNORMAL LOW (ref 39.0–52.0)
Hemoglobin: 13.4 g/dL (ref 13.0–17.0)
Immature Granulocytes: 1 %
Lymphocytes Relative: 35 %
Lymphs Abs: 2.1 10*3/uL (ref 0.7–4.0)
MCH: 30.7 pg (ref 26.0–34.0)
MCHC: 34.5 g/dL (ref 30.0–36.0)
MCV: 88.8 fL (ref 80.0–100.0)
Monocytes Absolute: 1.3 10*3/uL — ABNORMAL HIGH (ref 0.1–1.0)
Monocytes Relative: 22 %
Neutro Abs: 2.4 10*3/uL (ref 1.7–7.7)
Neutrophils Relative %: 40 %
Platelets: 238 10*3/uL (ref 150–400)
RBC: 4.37 MIL/uL (ref 4.22–5.81)
RDW: 13.6 % (ref 11.5–15.5)
WBC Morphology: INCREASED
WBC: 6 10*3/uL (ref 4.0–10.5)
nRBC: 0 % (ref 0.0–0.2)

## 2018-11-29 LAB — COMPREHENSIVE METABOLIC PANEL
ALT: 13 U/L (ref 0–44)
AST: 13 U/L — ABNORMAL LOW (ref 15–41)
Albumin: 3 g/dL — ABNORMAL LOW (ref 3.5–5.0)
Alkaline Phosphatase: 73 U/L (ref 38–126)
Anion gap: 11 (ref 5–15)
BUN: 10 mg/dL (ref 8–23)
CO2: 23 mmol/L (ref 22–32)
Calcium: 8.6 mg/dL — ABNORMAL LOW (ref 8.9–10.3)
Chloride: 96 mmol/L — ABNORMAL LOW (ref 98–111)
Creatinine, Ser: 0.68 mg/dL (ref 0.61–1.24)
GFR calc Af Amer: >60 mL/min (ref >60)
GFR calc non Af Amer: >60 mL/min (ref >60)
Glucose, Bld: 111 mg/dL — ABNORMAL HIGH (ref 70–99)
Potassium: 3.9 mmol/L (ref 3.5–5.1)
Sodium: 130 mmol/L — ABNORMAL LOW (ref 135–145)
Total Bilirubin: 0.6 mg/dL (ref 0.3–1.2)
Total Protein: 7.5 g/dL (ref 6.5–8.1)

## 2018-11-29 LAB — URINALYSIS, ROUTINE W REFLEX MICROSCOPIC
Bilirubin Urine: NEGATIVE
Glucose, UA: NEGATIVE mg/dL
Ketones, ur: 5 mg/dL — AB
Nitrite: NEGATIVE
Protein, ur: 100 mg/dL — AB
Specific Gravity, Urine: 1.02 (ref 1.005–1.030)
WBC, UA: >50 WBC/hpf — ABNORMAL HIGH (ref 0–5)
pH: 6 (ref 5.0–8.0)

## 2018-11-29 LAB — PROTIME-INR
INR: 1.1 (ref 0.8–1.2)
Prothrombin Time: 13.9 seconds (ref 11.4–15.2)

## 2018-11-29 LAB — LACTIC ACID, PLASMA: Lactic Acid, Venous: 0.9 mmol/L (ref 0.5–1.9)

## 2018-11-29 LAB — LIPASE, BLOOD: Lipase: 18 U/L (ref 11–51)

## 2018-11-29 MED ORDER — PIPERACILLIN-TAZOBACTAM 3.375 G IVPB
3.3750 g | Freq: Three times a day (TID) | INTRAVENOUS | Status: DC
  Administered 2018-11-29 – 2018-12-03 (×11): 3.375 g via INTRAVENOUS
  Filled 2018-11-29 (×11): qty 50

## 2018-11-29 MED ORDER — SODIUM CHLORIDE 0.9 % IV BOLUS
1000.0000 mL | Freq: Once | INTRAVENOUS | Status: AC
  Administered 2018-11-29: 1000 mL via INTRAVENOUS

## 2018-11-29 MED ORDER — ONDANSETRON HCL 4 MG/2ML IJ SOLN
4.0000 mg | Freq: Once | INTRAMUSCULAR | Status: AC
  Administered 2018-11-29: 4 mg via INTRAVENOUS
  Filled 2018-11-29: qty 2

## 2018-11-29 MED ORDER — MORPHINE SULFATE (PF) 2 MG/ML IV SOLN: 2.0000 mg | INTRAVENOUS | Status: DC | PRN

## 2018-11-29 MED ORDER — SODIUM CHLORIDE 0.9 % IV SOLN
INTRAVENOUS | Status: AC
  Administered 2018-11-29 – 2018-11-30 (×2): via INTRAVENOUS

## 2018-11-29 MED ORDER — MORPHINE SULFATE (PF) 4 MG/ML IV SOLN
4.0000 mg | Freq: Once | INTRAVENOUS | Status: AC
  Administered 2018-11-29: 4 mg via INTRAVENOUS
  Filled 2018-11-29: qty 1

## 2018-11-29 MED ORDER — ACETAMINOPHEN 325 MG PO TABS
650.0000 mg | ORAL_TABLET | Freq: Four times a day (QID) | ORAL | Status: DC | PRN
  Administered 2018-11-30: 650 mg via ORAL
  Filled 2018-11-29: qty 2

## 2018-11-29 MED ORDER — ACETAMINOPHEN 650 MG RE SUPP: 650.0000 mg | Freq: Four times a day (QID) | RECTAL | Status: DC | PRN

## 2018-11-29 MED ORDER — SODIUM CHLORIDE 0.9 % IV SOLN
INTRAVENOUS | Status: DC | PRN
  Administered 2018-11-29: 250 mL via INTRAVENOUS

## 2018-11-29 MED ORDER — IOHEXOL 300 MG/ML  SOLN
100.0000 mL | Freq: Once | INTRAMUSCULAR | Status: AC | PRN
  Administered 2018-11-29: 100 mL via INTRAVENOUS

## 2018-11-29 MED ORDER — SODIUM CHLORIDE (PF) 0.9 % IJ SOLN
INTRAMUSCULAR | Status: AC
  Filled 2018-11-29: qty 50

## 2018-11-29 NOTE — ED Triage Notes (Signed)
Pt reports that he was seen at Urology office and was sent to ED for surgery on bladder and bowels.

## 2018-11-29 NOTE — Telephone Encounter (Signed)
Copied from [place] [id number]. Topic: Quick Communication - See Telephone Encounter >> Nov 29, 2018  9:45 AM Robina Ade, Helene Kelp D wrote: CRM for notification. See Telephone encounter for: 11/29/18. Patient called and would like a call back as soon as possible. Patient said that his pain is worse when he has to urinate. Please call patient back, thanks.

## 2018-11-29 NOTE — ED Notes (Signed)
ED TO INPATIENT HANDOFF REPORT  ED Nurse Name and Phone #: [phone]  S Name/Age/Gender Adrian [name] 63 y.o. male Room/Bed: WA02/WA02  Code Status   Code Status: Prior  Home/SNF/Other Home Patient oriented to: self, place, time and situation Is this baseline? Yes   Triage Complete: Triage complete  Chief Complaint Blood in Urine  Triage Note Pt reports that he was seen at Urology office and was sent to ED for surgery on bladder and bowels.    Allergies No Known Allergies  Level of Care/Admitting Diagnosis ED Disposition    ED Disposition Condition Comment   Admit  Hospital Area: [place] [[id number]]  Level of Care: Med-Surg [16]  Covid Evaluation: N/A  Diagnosis: Abscess of sigmoid colon due to diverticulitis [[id number]]  Admitting Physician: Shela Leff [[id number]]  Attending Physician: Shela Leff [[id number]]  Estimated length of stay: past midnight tomorrow  Certification:: I certify this patient will need inpatient services for at least 2 midnights  PT Class (Do Not Modify): Inpatient [101]  PT Acc Code (Do Not Modify): Private [1]       B Medical/Surgery History Past Medical History:  Diagnosis Date  . CAD (coronary artery disease), native coronary artery 12/06/2015   a. 10/2015 MV: EF  37%, reversible defect inferior apex, intermediate risk findings; b. 10/2015 Cath: 20% mid RCA.  Marland Kitchen History of chemotherapy 2005   Cisplatin  . Hypothyroidism   . NICM (nonischemic cardiomyopathy) ([place]) 10/14/2015   a. Tachy mediated?;  b. Echo 3/17 - Mild concentric LVH, EF 30-35%, anteroseptal, anterior, anterolateral, apical anterior, lateral hypokinesis, trivial MR, mild to moderately reduced RVSF; c. LHC 3/17 - mRCA 20%  . Paroxysmal atrial flutter ([place])    a. TEE 3/17 with ? LAA clot-->s/p TEE/DCCV 11/24/2015  . Radiation NOv.3,2005-Dec. 15, 2005   6810 cGy in 30 fractions  . Tonsil cancer ([place]) 2005   Past Surgical History:  Procedure  Laterality Date  . CARDIAC CATHETERIZATION N/A 10/18/2015   Procedure: Left Heart Cath and Coronary Angiography;  Surgeon: Burnell Blanks, MD;  Location: [place] [facility];  Service: Cardiovascular;  Laterality: N/A;  . CARDIOVERSION N/A 11/24/2015   Procedure: CARDIOVERSION;  Surgeon: Josue Hector, MD;  Location: [facility] ENDOSCOPY;  Service: Cardiovascular;  Laterality: N/A;  . LAMINECTOMY     C5/placement of steel plate  . NECK SURGERY  2003   replaced disk  . TEE WITHOUT CARDIOVERSION N/A 10/15/2015   Procedure: TRANSESOPHAGEAL ECHOCARDIOGRAM (TEE);  Surgeon: Larey Dresser, MD;  Location: [place];  Service: Cardiovascular;  Laterality: N/A;  . TEE WITHOUT CARDIOVERSION N/A 11/24/2015   Procedure: TRANSESOPHAGEAL ECHOCARDIOGRAM (TEE);  Surgeon: Josue Hector, MD;  Location: [facility] ENDOSCOPY;  Service: Cardiovascular;  Laterality: N/A;     A IV Location/Drains/Wounds Patient Lines/Drains/Airways Status   Active Line/Drains/Airways    Name:   Placement date:   Placement time:   Site:   Days:   Peripheral IV 11/29/18 Right Forearm   11/29/18    1720    Forearm   less than 1          Intake/Output Last 24 hours No intake or output data in the 24 hours ending 11/29/18 1948  Labs/Imaging Results for orders placed or performed during the hospital encounter of 11/29/18 (from the past 48 hour(s))  CBC with Differential     Status: Abnormal   Collection Time: 11/29/18  4:47 PM  Result Value Ref Range   WBC 6.0 4.0 - 10.5  K/uL    Comment: WHITE COUNT CONFIRMED ON SMEAR   RBC 4.37 4.22 - 5.81 MIL/uL   Hemoglobin 13.4 13.0 - 17.0 g/dL   HCT 38.8 (L) 39.0 - 52.0 %   MCV 88.8 80.0 - 100.0 fL   MCH 30.7 26.0 - 34.0 pg   MCHC 34.5 30.0 - 36.0 g/dL   RDW 13.6 11.5 - 15.5 %   Platelets 238 150 - 400 K/uL   nRBC 0.0 0.0 - 0.2 %   Neutrophils Relative % 40 %   Neutro Abs 2.4 1.7 - 7.7 K/uL   Lymphocytes Relative 35 %   Lymphs Abs 2.1 0.7 - 4.0 K/uL   Monocytes Relative 22 %    Monocytes Absolute 1.3 (H) 0.1 - 1.0 K/uL   Eosinophils Relative 1 %   Eosinophils Absolute 0.1 0.0 - 0.5 K/uL   Basophils Relative 1 %   Basophils Absolute 0.0 0.0 - 0.1 K/uL   WBC Morphology INCREASED BANDS (>20% BANDS)     Comment: See Note TOXIC GRANULATION VACUOLATED NEUTROPHILS ATYPICAL LYMPHOCYTES PRESENT    Immature Granulocytes 1 %   Abs Immature Granulocytes 0.04 0.00 - 0.07 K/uL    Comment: Performed at [facility], [place] [address], [place], [place] [address]  Comprehensive metabolic panel     Status: Abnormal   Collection Time: 11/29/18  4:47 PM  Result Value Ref Range   Sodium 130 (L) 135 - 145 mmol/L   Potassium 3.9 3.5 - 5.1 mmol/L   Chloride 96 (L) 98 - 111 mmol/L   CO2 23 22 - 32 mmol/L   Glucose, Bld 111 (H) 70 - 99 mg/dL   BUN 10 8 - 23 mg/dL   Creatinine, Ser 0.68 0.61 - 1.24 mg/dL   Calcium 8.6 (L) 8.9 - 10.3 mg/dL   Total Protein 7.5 6.5 - 8.1 g/dL   Albumin 3.0 (L) 3.5 - 5.0 g/dL   AST 13 (L) 15 - 41 U/L   ALT 13 0 - 44 U/L   Alkaline Phosphatase 73 38 - 126 U/L   Total Bilirubin 0.6 0.3 - 1.2 mg/dL   GFR calc non Af Amer >60 >60 mL/min   GFR calc Af Amer >60 >60 mL/min   Anion gap 11 5 - 15    Comment: Performed at [facility], [place] [address] [place], [place] [address]  Lipase, blood     Status: None   Collection Time: 11/29/18  4:47 PM  Result Value Ref Range   Lipase 18 11 - 51 U/L    Comment: Performed at [facility], [place] [address] [place], [place] [address]  Urinalysis, Routine w reflex microscopic     Status: Abnormal   Collection Time: 11/29/18  4:47 PM  Result Value Ref Range   Color, Urine YELLOW YELLOW   APPearance TURBID (A) CLEAR   Specific Gravity, Urine 1.020 1.005 - 1.030   pH 6.0 5.0 - 8.0   Glucose, UA NEGATIVE NEGATIVE mg/dL   Hgb urine dipstick MODERATE (A) NEGATIVE   Bilirubin Urine NEGATIVE NEGATIVE   Ketones, ur 5 (A) NEGATIVE mg/dL   Protein, ur 100 (A)  NEGATIVE mg/dL   Nitrite NEGATIVE NEGATIVE   Leukocytes,Ua MODERATE (A) NEGATIVE   RBC / HPF 11-20 0 - 5 RBC/hpf   WBC, UA >50 (H) 0 - 5 WBC/hpf   Bacteria, UA MANY (A) NONE SEEN   WBC Clumps PRESENT    Mucus PRESENT     Comment: Performed  at [facility], [place] [address] [place], [place] [address]   No results found.  Pending Labs Unresulted Labs (From admission, onward)    Start     Ordered   11/29/18 1933  Protime-INR  Once,   STAT     11/29/18 1932          Vitals/Pain Today's Vitals   11/29/18 1800 11/29/18 1830 11/29/18 1900 11/29/18 1930  BP: (!) 100/57 (!) 99/56 (!) 121/92 113/78  Pulse: 73 73 74 75  Resp:    16  Temp:      TempSrc:      SpO2: 96% 97% 98% 100%    Isolation Precautions No active isolations  Medications Medications  morphine 4 MG/ML injection 4 mg (4 mg Intravenous Given 11/29/18 1720)  ondansetron (ZOFRAN) injection 4 mg (4 mg Intravenous Given 11/29/18 1720)  sodium chloride 0.9 % bolus 1,000 mL (1,000 mLs Intravenous New Bag/Given 11/29/18 1722)    Mobility walks Low fall risk   Focused Assessments    R Recommendations: See Admitting Provider Note  Report given to:   Additional Notes: pt has generalized abdominal pain possible abscess diverticulitis, surgery requesting further ct to rule out fistula to bladder area. Pt is alert and oriented, pain improved, urinary frequency

## 2018-11-29 NOTE — Progress Notes (Signed)
Patient ID: Adrian [name], male   DOB: 1956-06-01, 63 y.o.   MRN: [id number]   General surgery will be seeing the patient.  Hopefully IR can place a perc drain.

## 2018-11-29 NOTE — Telephone Encounter (Signed)
Patient and his recent wife called and stated that patient is in such severe pain that he can hardly move around and he can only produce small amounts of urine and can only lay down and get up to urinate. Patient has been on antibiotic and stated that his symptoms are much worse. I advised to them that patient needs to go to the ER. Patients recent wife stated that he is not able to go to the ER because he is not able to afford the visit. I advised for them to see if they can work out a payment plan with them. Patient verbalized an understanding.

## 2018-11-29 NOTE — H&P (Signed)
History and Physical    Adrian [name] WGN:[id number] DOB: 10/09/1955 DOA: 11/29/2018  PCP: Adrian Post, MD  Chief Complaint: Sent from urology for surgery  HPI: Adrian [name] is a 63 y.o. male with medical history significant of CAD, hypothyroidism, paroxysmal atrial fibrillation, history of tonsillar cancer being sent to the hospital from his urologist for surgery.  Patient was admitted from September 23, 2020 October 04, 2018 for acute sigmoid diverticulitis with abscess.  He was treated conservatively with antibiotics-IV Zosyn during his hospitalization and discharged home on a 14-day course of Augmentin.   Patient reports 3-week history of dysuria, urinary frequency, and urgency.  States symptoms have been getting progressively worse.  He is having excruciating pain every time he urinates and is noticing blood in his urine.  States his primary care doctor checked his urine and recently treated him for a UTI with an antibiotic but he continues to have symptoms.  States he was seen by his urologist today and they did a CT scan.  His urologist told him that he had a fistula in his bladder and he had to come into the hospital.  He has been having fevers and chills.  Also complaining of generalized abdominal pain.  Denies any nausea, vomiting, or diarrhea.  Denies any shortness of breath, cough, or chest pain.  Review of Systems: As per HPI otherwise 10 point review of systems negative.  Past Medical History:  Diagnosis Date  . CAD (coronary artery disease), native coronary artery 12/06/2015   a. 10/2015 MV: EF  37%, reversible defect inferior apex, intermediate risk findings; b. 10/2015 Cath: 20% mid RCA.  Marland Kitchen History of chemotherapy 2005   Cisplatin  . Hypothyroidism   . NICM (nonischemic cardiomyopathy) ([place]) 10/14/2015   a. Tachy mediated?;  b. Echo 3/17 - Mild concentric LVH, EF 30-35%, anteroseptal, anterior, anterolateral, apical anterior, lateral hypokinesis, trivial MR, mild to moderately  reduced RVSF; c. LHC 3/17 - mRCA 20%  . Paroxysmal atrial flutter ([place])    a. TEE 3/17 with ? LAA clot-->s/p TEE/DCCV 11/24/2015  . Radiation NOv.3,2005-Dec. 15, 2005   6810 cGy in 30 fractions  . Tonsil cancer ([place]) 2005    Past Surgical History:  Procedure Laterality Date  . CARDIAC CATHETERIZATION N/A 10/18/2015   Procedure: Left Heart Cath and Coronary Angiography;  Surgeon: Burnell Blanks, MD;  Location: [place] [facility];  Service: Cardiovascular;  Laterality: N/A;  . CARDIOVERSION N/A 11/24/2015   Procedure: CARDIOVERSION;  Surgeon: Josue Hector, MD;  Location: [facility] ENDOSCOPY;  Service: Cardiovascular;  Laterality: N/A;  . LAMINECTOMY     C5/placement of steel plate  . NECK SURGERY  2003   replaced disk  . TEE WITHOUT CARDIOVERSION N/A 10/15/2015   Procedure: TRANSESOPHAGEAL ECHOCARDIOGRAM (TEE);  Surgeon: Larey Dresser, MD;  Location: [place];  Service: Cardiovascular;  Laterality: N/A;  . TEE WITHOUT CARDIOVERSION N/A 11/24/2015   Procedure: TRANSESOPHAGEAL ECHOCARDIOGRAM (TEE);  Surgeon: Josue Hector, MD;  Location: [facility] ENDOSCOPY;  Service: Cardiovascular;  Laterality: N/A;     reports that he has been smoking cigarettes. He has a 10.00 pack-year smoking history. He has never used smokeless tobacco. He reports current alcohol use of about 12.0 standard drinks of alcohol per week. He reports that he does not use drugs.  No Known Allergies  Family History  Problem Relation Age of Onset  . Prostate cancer Father   . Colon cancer Father   . Skin cancer Mother   .  Cancer Brother     Prior to Admission medications   Medication Sig Start Date End Date Taking? Authorizing Provider  apixaban (ELIQUIS) 5 MG TABS tablet Take 1 tablet (5 mg total) by mouth 2 (two) times daily. 02/08/18   Sueanne Margarita, MD  atorvastatin (LIPITOR) 40 MG tablet Take 1 tablet (40 mg total) by mouth daily. Please keep upcoming appt in September for future refills. Thank you Patient taking  differently: Take 40 mg by mouth daily.  03/01/18   Imogene Burn, PA-C  ciprofloxacin (CIPRO) 500 MG tablet Take 1 tablet (500 mg total) by mouth 2 (two) times daily for 7 days. 11/26/18 12/03/18  Burchette, Alinda Sierras, MD  levothyroxine (SYNTHROID, LEVOTHROID) 125 MCG tablet Take 1 tablet (125 mcg total) by mouth daily. 12/05/17   Burchette, Alinda Sierras, MD  nebivolol (BYSTOLIC) 10 MG tablet Take 1 tablet (10 mg total) by mouth daily. 05/21/18   Sueanne Margarita, MD  OVER THE COUNTER MEDICATION Take 1 tablet by mouth as needed (for cold/pain). Alka seltzer    [provider]    Physical Exam: Vitals:   11/29/18 1800 11/29/18 1830 11/29/18 1900 11/29/18 1930  BP: (!) 100/57 (!) 99/56 (!) 121/92 113/78  Pulse: 73 73 74 75  Resp:    16  Temp:      TempSrc:      SpO2: 96% 97% 98% 100%    Physical Exam  Constitutional: He is oriented to person, place, and time. No distress.  HENT:  Head: Normocephalic.  Dry mucous membranes  Eyes: Right eye exhibits no discharge. Left eye exhibits no discharge.  Neck: Neck supple.  Cardiovascular: Normal rate, regular rhythm and intact distal pulses.  Pulmonary/Chest: Effort normal and breath sounds normal. No respiratory distress.  Abdominal: Soft. Bowel sounds are normal. He exhibits no distension. There is abdominal tenderness. There is guarding. There is no rebound.  Generalized tenderness to palpation with guarding  Musculoskeletal:        General: No edema.  Neurological: He is alert and oriented to person, place, and time.  Skin: Skin is warm and dry. He is not diaphoretic.     Labs on Admission: I have personally reviewed following labs and imaging studies  CBC: Recent Labs  Lab 11/29/18 1647  WBC 6.0  NEUTROABS 2.4  HGB 13.4  HCT 38.8*  MCV 88.8  PLT 865   Basic Metabolic Panel: Recent Labs  Lab 11/29/18 1647  NA 130*  K 3.9  CL 96*  CO2 23  GLUCOSE 111*  BUN 10  CREATININE 0.68  CALCIUM 8.6*   GFR: CrCl cannot be  calculated (Unknown ideal weight.). Liver Function Tests: Recent Labs  Lab 11/29/18 1647  AST 13*  ALT 13  ALKPHOS 73  BILITOT 0.6  PROT 7.5  ALBUMIN 3.0*   Recent Labs  Lab 11/29/18 1647  LIPASE 18   No results for input(s): AMMONIA in the last 168 hours. Coagulation Profile: No results for input(s): INR, PROTIME in the last 168 hours. Cardiac Enzymes: No results for input(s): CKTOTAL, CKMB, CKMBINDEX, TROPONINI in the last 168 hours. BNP (last 3 results) No results for input(s): PROBNP in the last 8760 hours. HbA1C: No results for input(s): HGBA1C in the last 72 hours. CBG: No results for input(s): GLUCAP in the last 168 hours. Lipid Profile: No results for input(s): CHOL, HDL, LDLCALC, TRIG, CHOLHDL, LDLDIRECT in the last 72 hours. Thyroid Function Tests: No results for input(s): TSH, T4TOTAL, FREET4, T3FREE, THYROIDAB in  the last 72 hours. Anemia Panel: No results for input(s): VITAMINB12, FOLATE, FERRITIN, TIBC, IRON, RETICCTPCT in the last 72 hours. Urine analysis:    Component Value Date/Time   COLORURINE YELLOW 11/29/2018 1647   APPEARANCEUR TURBID (A) 11/29/2018 1647   LABSPEC 1.020 11/29/2018 1647   PHURINE 6.0 11/29/2018 1647   GLUCOSEU NEGATIVE 11/29/2018 1647   HGBUR MODERATE (A) 11/29/2018 1647   BILIRUBINUR NEGATIVE 11/29/2018 1647   BILIRUBINUR neg 11/20/2018 0942   KETONESUR 5 (A) 11/29/2018 1647   PROTEINUR 100 (A) 11/29/2018 1647   UROBILINOGEN 0.2 11/20/2018 0942   NITRITE NEGATIVE 11/29/2018 1647   LEUKOCYTESUR MODERATE (A) 11/29/2018 1647    Radiological Exams on Admission: No results found.  Assessment/Plan Principal Problem:   Abscess of sigmoid colon due to diverticulitis Active Problems:   UTI (urinary tract infection)   Hyponatremia   AF (paroxysmal atrial fibrillation) (HCC)  Acute sigmoid diverticulitis complicated by an abscess Afebrile.  Blood pressure soft, now improved after 1 L IV fluid bolus.  Not tachycardic or  tachypneic.  No leukocytosis.  CT showing acute sigmoid diverticulitis associated with an intramural abscess, 2.6 cm in greatest dimension.  No extraluminal or free air.  This portion of the colon was involved by diverticulitis on the prior CT.  No other acute abnormality within the abdomen or pelvis. -Patient will need drainage of the abscess. ED provider discussed the case with Dr. Louis Meckel from urology who recommended general surgery intervention.  Discussed case with general surgery who requested medicine admission and will be available for consultation.  No recommendations at this time.  Discussed case with interventional radiology Dr. Kathlene Cote, who reviewed the CT and requested abd/pelvis CT with contrast media , delay enhancement to the pelvis/bladder region to assess for suspect bladder fistula. -IV fluid hydration -Zosyn -IV morphine 2 mg every 4 hours as needed for pain -Keep n.p.o. -CT abdomen pelvis with contrast -Check lactic acid level  UTI Afebrile.  Blood pressure soft, now improved after 1 L IV fluid bolus.  Not tachycardic or tachypneic.  No leukocytosis.  UA with moderate amount of leukocytes, 11-20 RBCs, greater than 50 WBCs, and many bacteria.   -IV Zosyn as above -Urine culture  Mild hyponatremia Sodium 130. -IV fluid hydration -Continue to monitor BMP  Paroxysmal atrial fibrillation -Will hold Eliquis at this time, pending drainage of diverticular abscess.   Unable to safely order home medications at this time as pharmacy medication reconciliation is pending.  DVT prophylaxis: SCDs.  Pending drainage of diverticular abscess. Code Status: Full code Family Communication: Family updated over the phone. Disposition Plan: Anticipate discharge after clinical improvement. Consults called: Urology (Dr. Louis Meckel), general surgery (Dr. Ninfa Linden), interventional radiology Admission status: It is my clinical opinion that admission to INPATIENT is reasonable and necessary in this  63 y.o. male . presenting with acute diverticulitis complicated by abscess, UTI, suspicion for bladder fistula . Workup and treatment include IV antibiotic, IV pain medication, IV fluid hydration.  Pending IR evaluation in the morning for possible drainage of diverticular abscess.  Given the aforementioned, the predictability of an adverse outcome is felt to be significant. I expect that the patient will require at least 2 midnights in the hospital to treat this condition.   This chart was dictated using voice recognition software.  Despite best efforts to proofread, errors can occur which can change the documentation meaning.  Shela Leff MD [facility] Pager [phone]  If 7PM-7AM, please contact night-coverage [email or web address] Password [facility]  11/29/2018, 8:33  PM

## 2018-11-29 NOTE — ED Provider Notes (Signed)
[facility] DEPT Provider Note   CSN: [id number] Arrival date & time: 11/29/18  1439    History   Chief Complaint Chief Complaint  Patient presents with  . sent from Urology for surgery    HPI Adrian [name] is a 63 y.o. male.     The history is provided by the patient and medical records. No language interpreter was used.     63 year old male recently admitted to the hospital for acute sigmoid diverticulitis with abscess on 2/17 discharged on 2/20 presenting today for worsening abdominal pain.  During hospitalization patient was treated with IV Zosyn and conservative management.  He was subsequently discharged on Augmentin for a 14-day course.  Patient is that he felt better however for the past week and a half he has had pain to his suprapubic region.  Pain is sharp burning throbbing, with increasing pain when he urinates.  He endorsed polyuria urea and polydipsia and today he also noticed that his urine has blood in it.  His abdominal pain has been waxing waning but sometimes severe.  He is having difficulty sleeping with it.  He endorsed chills.  He denies fever, nausea, vomiting, diarrhea, constipation, pain in his chest or trouble breathing.  He denies any back pain.  He was seen by Dr. Alyson Ingles today for his symptom, and was told that he will probably need surgery but encourage patient to come to ER for further evaluation.  Patient did mention he was seen by his PCP approximately 2 weeks ago with complaints of burning urination.  States that he had a urinalysis and was told that was normal.  Past Medical History:  Diagnosis Date  . CAD (coronary artery disease), native coronary artery 12/06/2015   a. 10/2015 MV: EF  37%, reversible defect inferior apex, intermediate risk findings; b. 10/2015 Cath: 20% mid RCA.  Marland Kitchen History of chemotherapy 2005   Cisplatin  . Hypothyroidism   . NICM (nonischemic cardiomyopathy) ([place]) 10/14/2015   a. Tachy mediated?;  b. Echo  3/17 - Mild concentric LVH, EF 30-35%, anteroseptal, anterior, anterolateral, apical anterior, lateral hypokinesis, trivial MR, mild to moderately reduced RVSF; c. LHC 3/17 - mRCA 20%  . Paroxysmal atrial flutter ([place])    a. TEE 3/17 with ? LAA clot-->s/p TEE/DCCV 11/24/2015  . Radiation NOv.3,2005-Dec. 15, 2005   6810 cGy in 30 fractions  . 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 Diverticulitis of large intestine with abscess 09/23/2018  . Dyslipidemia 12/05/2017  . CAD (coronary artery disease), native coronary artery 12/06/2015  . SOB (shortness of breath)   . NICM (nonischemic cardiomyopathy) ([place])   . Diverticulitis of intestine without perforation or abscess without bleeding   . Thyroid activity decreased   . Atrial flutter ([place]) 10/12/2015  . Diverticulitis 10/12/2015  . Hypothyroidism 08/12/2013  . History of radiation therapy 05/30/2012  . Smokes tobacco daily 05/30/2012  . Cancer of tonsillar fossa ([place]) 11/27/2011    Past Surgical History:  Procedure Laterality Date  . CARDIAC CATHETERIZATION N/A 10/18/2015   Procedure: Left Heart Cath and Coronary Angiography;  Surgeon: Burnell Blanks, MD;  Location: [facility] CV LAB;  Service: Cardiovascular;  Laterality: N/A;  . CARDIOVERSION N/A 11/24/2015   Procedure: CARDIOVERSION;  Surgeon: Josue Hector, MD;  Location: [facility] ENDOSCOPY;  Service: Cardiovascular;  Laterality: N/A;  . LAMINECTOMY     C5/placement of steel plate  . NECK SURGERY  2003  replaced disk  . TEE WITHOUT CARDIOVERSION N/A 10/15/2015   Procedure: TRANSESOPHAGEAL ECHOCARDIOGRAM (TEE);  Surgeon: Larey Dresser, MD;  Location: [facility];  Service: Cardiovascular;  Laterality: N/A;  . TEE WITHOUT CARDIOVERSION N/A 11/24/2015   Procedure: TRANSESOPHAGEAL ECHOCARDIOGRAM (TEE);  Surgeon: Josue Hector, MD;  Location: [facility] ENDOSCOPY;  Service: Cardiovascular;  Laterality: N/A;        Home Medications     Prior to Admission medications   Medication Sig Start Date End Date Taking? Authorizing Provider  apixaban (ELIQUIS) 5 MG TABS tablet Take 1 tablet (5 mg total) by mouth 2 (two) times daily. 02/08/18   Sueanne Margarita, MD  atorvastatin (LIPITOR) 40 MG tablet Take 1 tablet (40 mg total) by mouth daily. Please keep upcoming appt in September for future refills. Thank you Patient taking differently: Take 40 mg by mouth daily.  03/01/18   Imogene Burn, PA-C  ciprofloxacin (CIPRO) 500 MG tablet Take 1 tablet (500 mg total) by mouth 2 (two) times daily for 7 days. 11/26/18 12/03/18  Burchette, Alinda Sierras, MD  levothyroxine (SYNTHROID, LEVOTHROID) 125 MCG tablet Take 1 tablet (125 mcg total) by mouth daily. 12/05/17   Burchette, Alinda Sierras, MD  nebivolol (BYSTOLIC) 10 MG tablet Take 1 tablet (10 mg total) by mouth daily. 05/21/18   Sueanne Margarita, MD  OVER THE COUNTER MEDICATION Take 1 tablet by mouth as needed (for cold/pain). Alka seltzer    [provider]    Family History Family History  Problem Relation Age of Onset  . Prostate cancer Father   . Colon cancer Father   . Skin cancer Mother   . Cancer Brother     Social History Social History   Tobacco Use  . Smoking status: Current Every Day Smoker    Packs/day: 0.50    Years: 20.00    Pack years: 10.00    Types: Cigarettes  . Smokeless tobacco: Never Used  . Tobacco comment: 1/2 pack per day  Substance Use Topics  . Alcohol use: Yes    Alcohol/week: 12.0 standard drinks    Types: 12 Cans of beer per week    Comment: couple of beers each day  . Drug use: No     Allergies   Patient has no known allergies.   Review of Systems Review of Systems  All other systems reviewed and are negative.    Physical Exam Updated Vital Signs BP 123/71 (BP Location: Left Arm)   Pulse 80   Temp 98.5 F (36.9 C) (Oral)   Resp 18   SpO2 100%   Physical Exam Vitals signs and nursing note reviewed.  Constitutional:      General:  He is not in acute distress.    Appearance: He is well-developed.  HENT:     Head: Atraumatic.  Eyes:     Conjunctiva/sclera: Conjunctivae normal.  Neck:     Musculoskeletal: Neck supple.  Cardiovascular:     Rate and Rhythm: Rhythm irregular.     Pulses: Normal pulses.     Heart sounds: Normal heart sounds.  Pulmonary:     Effort: Pulmonary effort is normal.     Breath sounds: Normal breath sounds.  Abdominal:     Tenderness: There is abdominal tenderness (Tenderness to suprapubic region on palpation with guarding but without rebound tenderness.). There is no right CVA tenderness or left CVA tenderness.  Genitourinary:    Penis: Normal.      Scrotum/Testes: Normal.  Skin:  General: Skin is warm.     Findings: No rash.  Neurological:     Mental Status: He is alert and oriented to person, place, and time.      ED Treatments / Results  Labs (all labs ordered are listed, but only abnormal results are displayed) Labs Reviewed  CBC WITH DIFFERENTIAL/PLATELET - Abnormal; Notable for the following components:      Result Value   HCT 38.8 (*)    Monocytes Absolute 1.3 (*)    All other components within normal limits  COMPREHENSIVE METABOLIC PANEL - Abnormal; Notable for the following components:   Sodium 130 (*)    Chloride 96 (*)    Glucose, Bld 111 (*)    Calcium 8.6 (*)    Albumin 3.0 (*)    AST 13 (*)    All other components within normal limits  URINALYSIS, ROUTINE W REFLEX MICROSCOPIC - Abnormal; Notable for the following components:   APPearance TURBID (*)    Hgb urine dipstick MODERATE (*)    Ketones, ur 5 (*)    Protein, ur 100 (*)    Leukocytes,Ua MODERATE (*)    WBC, UA >50 (*)    Bacteria, UA MANY (*)    All other components within normal limits  LIPASE, BLOOD  PROTIME-INR    EKG None  Radiology No results found.    Procedures Procedures (including critical care time)  Medications Ordered in ED Medications  morphine 4 MG/ML injection 4 mg  (4 mg Intravenous Given 11/29/18 1720)  ondansetron (ZOFRAN) injection 4 mg (4 mg Intravenous Given 11/29/18 1720)  sodium chloride 0.9 % bolus 1,000 mL (1,000 mLs Intravenous New Bag/Given 11/29/18 1722)     Initial Impression / Assessment and Plan / ED Course  I have reviewed the triage vital signs and the nursing notes.  Pertinent labs & imaging results that were available during my care of the patient were reviewed by me and considered in my medical decision making (see chart for details).        BP 123/71 (BP Location: Left Arm)   Pulse 80   Temp 98.5 F (36.9 C) (Oral)   Resp 18   SpO2 100%    Final Clinical [name](s) / ED Diagnoses   Final diagnoses:  Abscess of sigmoid colon due to diverticulitis  Lower urinary tract infectious disease    ED Discharge Orders    None     4:09 PM Patient here with dysuria and blood in urine suggestive of UTI.  He also report having been diagnosed with diverticulitis with abscess 2 months ago when he was hospitalized and received IV antibiotic.  He does have tenderness to suprapubic region on palpation but no CVA tenderness.  He is currently afebrile, vital signs stable.  Work-up initiated. Will check UA and will obtain abd/pelvis CT for further evaluation.   Pain medication given along with IVF.   Pt's dysuria and lower abd pain is concerning for potential bladder abscess and fistula formation due to previous sigmoid diverticulitis with abscess 2 months prior.  Will consult urology.   5:45 PM Patient had an abdominal pelvis CT scan today.  Impression include interval worsening of moderate to severe sigmoid diverticulitis, with new 6.2 cm diverticular abscess in the central pelvis.  No evidence of urolithiasis or hydronephrosis.  I did discuss this finding with urologist, Dr. Louis Meckel who recommend general surgery intervention as the abscess will likely need to be drained.  He also encourage antibiotic to cover for superimposed  UTI.   6:24 PM Appreciate consultation from General Surgery Dr. Ninfa Linden who request medicine admission and he will be available for consultation.   7:43 PM I have consulted IR specialist Dr. Kathlene Cote who have reviewed the CT and request abd/pelvis CT with contrast media , delay enhancement to the pelvis/bladder region to assess for suspect bladder fistula.  Pt should be NPO at midnight.  I have consulted [facility] DR. Rathore who agrees to see and admit pt for further care.  Will order CT scan.  Pt is currently comfortable with pain medication.     Domenic Moras, PA-C 11/29/18 1946    Virgel Manifold, MD 11/29/18 2256

## 2018-11-29 NOTE — Telephone Encounter (Signed)
Patient is reporting significant pain- is having significant pain and going small amounts. Patient reports pain is no better on antibiotics. Call to office due to patient's detortion and wife's concern. Jinny Blossom took call.

## 2018-11-29 NOTE — ED Notes (Signed)
Pt is in ct, will transport to floor on arrival back to treatment room

## 2018-11-29 NOTE — ED Notes (Signed)
Admitting provider at bedside. CT tech aware pt has bed, will get pt for ct prior to transport to the floor.

## 2018-11-30 ENCOUNTER — Inpatient Hospital Stay (HOSPITAL_COMMUNITY): Payer: BLUE CROSS/BLUE SHIELD

## 2018-11-30 LAB — BASIC METABOLIC PANEL
Anion gap: 8 (ref 5–15)
BUN: 9 mg/dL (ref 8–23)
CO2: 23 mmol/L (ref 22–32)
Calcium: 8.2 mg/dL — ABNORMAL LOW (ref 8.9–10.3)
Chloride: 103 mmol/L (ref 98–111)
Creatinine, Ser: 0.73 mg/dL (ref 0.61–1.24)
GFR calc Af Amer: >60 mL/min (ref >60)
GFR calc non Af Amer: >60 mL/min (ref >60)
Glucose, Bld: 102 mg/dL — ABNORMAL HIGH (ref 70–99)
Potassium: 3.9 mmol/L (ref 3.5–5.1)
Sodium: 134 mmol/L — ABNORMAL LOW (ref 135–145)

## 2018-11-30 MED ORDER — MIDAZOLAM HCL 2 MG/2ML IJ SOLN
INTRAMUSCULAR | Status: AC | PRN
  Administered 2018-11-30 (×2): 1 mg via INTRAVENOUS

## 2018-11-30 MED ORDER — FENTANYL CITRATE (PF) 100 MCG/2ML IJ SOLN
INTRAMUSCULAR | Status: AC | PRN
  Administered 2018-11-30 (×2): 50 ug via INTRAVENOUS

## 2018-11-30 MED ORDER — PHENAZOPYRIDINE HCL 100 MG PO TABS
100.0000 mg | ORAL_TABLET | Freq: Three times a day (TID) | ORAL | Status: DC
  Administered 2018-11-30 – 2018-12-03 (×8): 100 mg via ORAL
  Filled 2018-11-30 (×10): qty 1

## 2018-11-30 MED ORDER — SODIUM CHLORIDE 0.9 % IV SOLN
INTRAVENOUS | Status: DC
  Administered 2018-11-30: 15:00:00 via INTRAVENOUS

## 2018-11-30 MED ORDER — FENTANYL CITRATE (PF) 100 MCG/2ML IJ SOLN
INTRAMUSCULAR | Status: AC
  Filled 2018-11-30: qty 4

## 2018-11-30 MED ORDER — SODIUM CHLORIDE 0.9% FLUSH
10.0000 mL | Freq: Three times a day (TID) | INTRAVENOUS | Status: DC
  Administered 2018-11-30 – 2018-12-04 (×9): 10 mL

## 2018-11-30 MED ORDER — ONDANSETRON HCL 4 MG/2ML IJ SOLN: 4.0000 mg | Freq: Four times a day (QID) | INTRAMUSCULAR | Status: DC | PRN

## 2018-11-30 MED ORDER — MIDAZOLAM HCL 2 MG/2ML IJ SOLN
INTRAMUSCULAR | Status: AC
  Filled 2018-11-30: qty 4

## 2018-11-30 NOTE — Consult Note (Signed)
Reason for Consult:diverticular abscess Referring Physician: Dr. Salomon [name] is an 63 y.o. male.  HPI: This is a 63 year old gentleman with a history of head neck cancer status post chemotherapy and radiation, a fib on Eliquis, coronary artery disease, and history of smoking who was admitted back in February with a intramural diverticular abscess.  He was treated conservatively with IV antibiotics and finally improved.  He had not followed up with surgery as an outpatient secondary to the coronavirus outbreak.  He is now been having dysuria, urinary frequency, urinary urgency, and now colored urine.  He saw his primary care physicians and urology.  A CT scan was ordered and then he was sent to the emergency department after the scan demonstrated a large pelvic abscess measuring over 6 cm.  Currently, he reports low abdominal pain and continued dysuria.  He has moved his bowels.  He denies fevers or chills.  Past Medical History:  Diagnosis Date  . CAD (coronary artery disease), native coronary artery 12/06/2015   a. 10/2015 MV: EF  37%, reversible defect inferior apex, intermediate risk findings; b. 10/2015 Cath: 20% mid RCA.  Marland Kitchen History of chemotherapy 2005   Cisplatin  . Hypothyroidism   . NICM (nonischemic cardiomyopathy) ([place]) 10/14/2015   a. Tachy mediated?;  b. Echo 3/17 - Mild concentric LVH, EF 30-35%, anteroseptal, anterior, anterolateral, apical anterior, lateral hypokinesis, trivial MR, mild to moderately reduced RVSF; c. LHC 3/17 - mRCA 20%  . Paroxysmal atrial flutter ([place])    a. TEE 3/17 with ? LAA clot-->s/p TEE/DCCV 11/24/2015  . Radiation NOv.3,2005-Dec. 15, 2005   6810 cGy in 30 fractions  . Tonsil cancer ([place]) 2005    Past Surgical History:  Procedure Laterality Date  . CARDIAC CATHETERIZATION N/A 10/18/2015   Procedure: Left Heart Cath and Coronary Angiography;  Surgeon: Burnell Blanks, MD;  Location: [facility] CV LAB;  Service: Cardiovascular;   Laterality: N/A;  . CARDIOVERSION N/A 11/24/2015   Procedure: CARDIOVERSION;  Surgeon: Josue Hector, MD;  Location: [facility] ENDOSCOPY;  Service: Cardiovascular;  Laterality: N/A;  . LAMINECTOMY     C5/placement of steel plate  . NECK SURGERY  2003   replaced disk  . TEE WITHOUT CARDIOVERSION N/A 10/15/2015   Procedure: TRANSESOPHAGEAL ECHOCARDIOGRAM (TEE);  Surgeon: Larey Dresser, MD;  Location: [facility];  Service: Cardiovascular;  Laterality: N/A;  . TEE WITHOUT CARDIOVERSION N/A 11/24/2015   Procedure: TRANSESOPHAGEAL ECHOCARDIOGRAM (TEE);  Surgeon: Josue Hector, MD;  Location: [facility] ENDOSCOPY;  Service: Cardiovascular;  Laterality: N/A;    Family History  Problem Relation Age of Onset  . Prostate cancer Father   . Colon cancer Father   . Skin cancer Mother   . Cancer Brother     Social History:  reports that he has been smoking cigarettes. He has a 10.00 pack-year smoking history. He has never used smokeless tobacco. He reports current alcohol use of about 12.0 standard drinks of alcohol per week. He reports that he does not use drugs.  Allergies: No Known Allergies  Medications: I have reviewed the patient's current medications.  Results for orders placed or performed during the hospital encounter of 11/29/18 (from the past 48 hour(s))  CBC with Differential     Status: Abnormal   Collection Time: 11/29/18  4:47 PM  Result Value Ref Range   WBC 6.0 4.0 - 10.5 K/uL    Comment: WHITE COUNT CONFIRMED ON SMEAR   RBC 4.37 4.22 - 5.81 MIL/uL  Hemoglobin 13.4 13.0 - 17.0 g/dL   HCT 38.8 (L) 39.0 - 52.0 %   MCV 88.8 80.0 - 100.0 fL   MCH 30.7 26.0 - 34.0 pg   MCHC 34.5 30.0 - 36.0 g/dL   RDW 13.6 11.5 - 15.5 %   Platelets 238 150 - 400 K/uL   nRBC 0.0 0.0 - 0.2 %   Neutrophils Relative % 40 %   Neutro Abs 2.4 1.7 - 7.7 K/uL   Lymphocytes Relative 35 %   Lymphs Abs 2.1 0.7 - 4.0 K/uL   Monocytes Relative 22 %   Monocytes Absolute 1.3 (H) 0.1 - 1.0 K/uL   Eosinophils  Relative 1 %   Eosinophils Absolute 0.1 0.0 - 0.5 K/uL   Basophils Relative 1 %   Basophils Absolute 0.0 0.0 - 0.1 K/uL   WBC Morphology INCREASED BANDS (>20% BANDS)     Comment: See Note TOXIC GRANULATION VACUOLATED NEUTROPHILS ATYPICAL LYMPHOCYTES PRESENT    Immature Granulocytes 1 %   Abs Immature Granulocytes 0.04 0.00 - 0.07 K/uL    Comment: Performed at [facility], [place] [address] [place], [place] [address]  Comprehensive metabolic panel     Status: Abnormal   Collection Time: 11/29/18  4:47 PM  Result Value Ref Range   Sodium 130 (L) 135 - 145 mmol/L   Potassium 3.9 3.5 - 5.1 mmol/L   Chloride 96 (L) 98 - 111 mmol/L   CO2 23 22 - 32 mmol/L   Glucose, Bld 111 (H) 70 - 99 mg/dL   BUN 10 8 - 23 mg/dL   Creatinine, Ser 0.68 0.61 - 1.24 mg/dL   Calcium 8.6 (L) 8.9 - 10.3 mg/dL   Total Protein 7.5 6.5 - 8.1 g/dL   Albumin 3.0 (L) 3.5 - 5.0 g/dL   AST 13 (L) 15 - 41 U/L   ALT 13 0 - 44 U/L   Alkaline Phosphatase 73 38 - 126 U/L   Total Bilirubin 0.6 0.3 - 1.2 mg/dL   GFR calc non Af Amer >60 >60 mL/min   GFR calc Af Amer >60 >60 mL/min   Anion gap 11 5 - 15    Comment: Performed at [facility], [place] [address], [place], [place] [address]  Lipase, blood     Status: None   Collection Time: 11/29/18  4:47 PM  Result Value Ref Range   Lipase 18 11 - 51 U/L    Comment: Performed at [facility], [place] [address] [place], [place] [address]  Urinalysis, Routine w reflex microscopic     Status: Abnormal   Collection Time: 11/29/18  4:47 PM  Result Value Ref Range   Color, Urine YELLOW YELLOW   APPearance TURBID (A) CLEAR   Specific Gravity, Urine 1.020 1.005 - 1.030   pH 6.0 5.0 - 8.0   Glucose, UA NEGATIVE NEGATIVE mg/dL   Hgb urine dipstick MODERATE (A) NEGATIVE   Bilirubin Urine NEGATIVE NEGATIVE   Ketones, ur 5 (A) NEGATIVE mg/dL   Protein, ur 100 (A) NEGATIVE mg/dL   Nitrite NEGATIVE NEGATIVE   Leukocytes,Ua  MODERATE (A) NEGATIVE   RBC / HPF 11-20 0 - 5 RBC/hpf   WBC, UA >50 (H) 0 - 5 WBC/hpf   Bacteria, UA MANY (A) NONE SEEN   WBC Clumps PRESENT    Mucus PRESENT     Comment: Performed at [facility], [place] [address] [place], [place] [address]  Protime-INR     Status: None  Collection Time: 11/29/18  9:09 PM  Result Value Ref Range   Prothrombin Time 13.9 11.4 - 15.2 seconds   INR 1.1 0.8 - 1.2    Comment: (NOTE) INR goal varies based on device and disease states. Performed at [facility], [place] [address], [place], [place] [address]   Lactic acid, plasma     Status: None   Collection Time: 11/29/18  9:09 PM  Result Value Ref Range   Lactic Acid, Venous 0.9 0.5 - 1.9 mmol/L    Comment: Performed at [facility], [place] [address], [place], [place] [address]  Basic metabolic panel     Status: Abnormal   Collection Time: 11/30/18  3:59 AM  Result Value Ref Range   Sodium 134 (L) 135 - 145 mmol/L   Potassium 3.9 3.5 - 5.1 mmol/L   Chloride 103 98 - 111 mmol/L   CO2 23 22 - 32 mmol/L   Glucose, Bld 102 (H) 70 - 99 mg/dL   BUN 9 8 - 23 mg/dL   Creatinine, Ser 0.73 0.61 - 1.24 mg/dL   Calcium 8.2 (L) 8.9 - 10.3 mg/dL   GFR calc non Af Amer >60 >60 mL/min   GFR calc Af Amer >60 >60 mL/min   Anion gap 8 5 - 15    Comment: Performed at [facility], [place] [address] [place], [place] [address]    Ct Abdomen Pelvis W Contrast  Result Date: 11/29/2018 CLINICAL DATA:  Recent sigmoid diverticulitis and abscess. Recurrent suprapubic pain. EXAM: CT ABDOMEN AND PELVIS WITH CONTRAST TECHNIQUE: Multidetector CT imaging of the abdomen and pelvis was performed using the standard protocol following bolus administration of intravenous contrast. CONTRAST:  17mL OMNIPAQUE IOHEXOL 300 MG/ML  SOLN COMPARISON:  Earlier today at [facility] FINDINGS: Lower chest: 6 mm nodule in the left lower lobe. No effusions. Heart  is normal size. Hepatobiliary: No focal hepatic abnormality. Gallbladder unremarkable. Pancreas: No focal abnormality or ductal dilatation. Spleen: No focal abnormality.  Normal size. Adrenals/Urinary Tract: No adrenal abnormality. No focal renal abnormality. No stones or hydronephrosis. Urinary bladder is unremarkable. Mass effect from the fluid collection along the superior bladder wall. No fistulous communication noted. Stomach/Bowel: There Is sigmoid diverticulosis. Wall thickening noted in the mid sigmoid colon. Fluid collection noted likely within the wall of the sigmoid colon measuring up to 2.3 cm. There is adjacent fluid collection along the superior bladder wall measuring 5.7 x 4.6 cm. No evidence of bowel obstruction. Vascular/Lymphatic: Aortic atherosclerosis. No enlarged abdominal or pelvic lymph nodes. Reproductive: Mildly prominent prostate. Other: No free fluid or free air. Musculoskeletal: No acute bony abnormality. IMPRESSION: Changes of sigmoid diverticulosis and diverticulitis. Probable intramural abscess within the mid sigmoid colon. Adjacent central pelvic fluid collection along the superior wall of the bladder measures 5.6 x 4.6 cm compatible with abscess. Mass effect on the bladder. No visible fistula. Electronically Signed   By: Rolm Baptise M.D.   On: 11/29/2018 20:40    Review of Systems  Constitutional: Negative for chills and fever.  Respiratory: Negative for cough, sputum production and shortness of breath.   Cardiovascular: Negative for chest pain.  Gastrointestinal: Positive for abdominal pain. Negative for nausea and vomiting.  Genitourinary: Positive for dysuria, frequency and urgency.  All other systems reviewed and are negative.  Blood pressure (!) 138/54, pulse (!) 52, temperature 97.7 F (36.5 C), temperature source Oral, resp. rate 17, height 5\' 8"  (1.727 m), weight 57.7 kg, SpO2 99 %.  Physical Exam  Constitutional: He is oriented to person, place, and time. He  appears well-developed and well-nourished. No distress.  HENT:  Head: Normocephalic and atraumatic.  Right Ear: External ear normal.  Left Ear: External ear normal.  Nose: Nose normal.  Mouth/Throat: Oropharynx is clear and moist. No oropharyngeal exudate.  Eyes: Pupils are equal, round, and reactive to light. Right eye exhibits no discharge. Left eye exhibits no discharge. No scleral icterus.  Neck: Normal range of motion. No tracheal deviation present.  Cardiovascular: Normal rate, normal heart sounds and intact distal pulses.  No murmur heard. Respiratory: Effort normal and breath sounds normal. No respiratory distress. He has no wheezes.  GI: Soft. There is abdominal tenderness. There is guarding.  There is suprapubic abdominal tenderness with guarding the rest of the abdomen is soft and nontender  Musculoskeletal: Normal range of motion.        General: No edema.  Lymphadenopathy:    He has no cervical adenopathy.  Neurological: He is alert and oriented to person, place, and time.  Skin: Skin is warm and dry. He is not diaphoretic. No erythema.  Psychiatric: His behavior is normal. Judgment normal.    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  I discussed the diagnosis with the patient in detail.  If IR is unable to place a drain and his current condition does not improve, he would require surgery with resection and a probable colostomy.  Because of his Eliquis, the drain may not be able to be attempted for the next 24 to 48 hours depending on radiology's opinion.  He will remain on antibiotics and n.p.o. at this point.  We will follow him closely.  Coralie Keens 11/30/2018, 8:43 AM

## 2018-11-30 NOTE — Procedures (Signed)
Interventional Radiology Procedure Note  Procedure: CT Guided Drainage of Diverticular Abscess  Complications: None  Estimated Blood Loss: < 10 mL  Findings: 12 Fr drain placed in diverticular abscess with return of purulent fluid. Fluid sample sent for culture analysis. Drain attached to suction bulb drainage.  Will follow.  Venetia Night. Kathlene Cote, M.D Pager:  [phone]

## 2018-11-30 NOTE — Progress Notes (Signed)
PROGRESS NOTE    Adrian [name]  EPP:[id number] DOB: [birth year] DOA: 11/29/2018 PCP: [name], MD     Brief Narrative:  Adrian [name] is a 63 y.o. male with medical history significant of CAD, hypothyroidism, paroxysmal atrial fibrillation, history of tonsillar cancer being sent to the hospital from his urologist for surgery.  Patient was admitted from September 23, 2020 October 04, 2018 for acute sigmoid diverticulitis with abscess.  He was treated conservatively with IV Zosyn during his hospitalization and discharged home on a 14-day course of Augmentin.   Patient reports 3-week history of dysuria, urinary frequency, and urgency.  States symptoms have been getting progressively worse.  He is having excruciating pain every time he urinates and is noticing blood in his urine.  States his primary care doctor checked his urine and recently treated him for a UTI with an antibiotic but he continues to have symptoms.  States he was seen by his urologist and they did a CT scan.  His urologist told him that he had a fistula in his bladder and he had to come into the hospital.  He has been having fevers and chills.  Also complaining of generalized abdominal pain.  Denies any nausea, vomiting, or diarrhea.  Denies any shortness of breath, cough, or chest pain.  New events last 24 hours / Subjective: Continues to have dysuria as well as hematuria.  Assessment & Plan:   Principal Problem:   Abscess of sigmoid colon due to diverticulitis Active Problems:   UTI (urinary tract infection)   Hyponatremia   AF (paroxysmal atrial fibrillation) (HCC)   Acute sigmoid diverticulitis with abscess -CT A/P: Changes of sigmoid diverticulosis and diverticulitis. Probable intramural abscess within the mid sigmoid colon. Adjacent central pelvic fluid collection along the superior wall of the bladder measures 5.6 x 4.6 cm compatible with abscess. Mass effect on the bladder. No visible fistula. -General  surgery following -IR consulted for abscess drainage -Continue IV Zosyn -Pain control  Urinary tract infection present on admission -UA showed many bacteria, greater than 50 WBC, moderate leukocytes -Urine culture pending -IV Zosyn as above  Paroxysmal atrial fibrillation -Eliquis on hold due to pending procedure   DVT prophylaxis: Eliquis on hold pending procedure, SCD Code Status: Full Family Communication: None Disposition Plan: IR for perc drain of abscess   Consultants:   IR  General Surgery  Procedures:   None   Antimicrobials:  Anti-infectives (From admission, onward)   Start     Dose/Rate Route Frequency Ordered Stop   11/29/18 2100  piperacillin-tazobactam (ZOSYN) IVPB 3.375 g     3.375 g 12.5 mL/hr over 240 Minutes Intravenous Every 8 hours 11/29/18 2030          Objective: Vitals:   11/29/18 2054 11/29/18 2113 11/30/18 0509 11/30/18 0514  BP: (!) 115/59  113/64 (!) 138/54  Pulse: 67  63 (!) 52  Resp: 16  17   Temp: 99.7 F (37.6 C)  98.8 F (37.1 C) 97.7 F (36.5 C)  TempSrc: Oral  Oral Oral  SpO2: 98%  98% 99%  Weight:  57.7 kg    Height:  5\' 8"  (1.727 m)      Intake/Output Summary (Last 24 hours) at 11/30/2018 1052 Last data filed at 11/30/2018 1000 Gross per 24 hour  Intake 1193.84 ml  Output 820 ml  Net 373.84 ml   Filed Weights   11/29/18 2113  Weight: 57.7 kg    Examination:  General exam: Appears calm and  uncomfortable  Respiratory system: Clear to auscultation. Respiratory effort normal. Cardiovascular system: S1 & S2 heard, RRR. No JVD, murmurs, rubs, gallops or clicks. No pedal edema. Gastrointestinal system: Abdomen is nondistended, soft and TTP generalized. No organomegaly or masses felt. Normal bowel sounds heard. Central nervous system: Alert and oriented. No focal neurological deficits. Extremities: Symmetric 5 x 5 power. Skin: No rashes, lesions or ulcers Psychiatry: Judgement and insight appear normal. Mood &  affect appropriate.   Data Reviewed: I have personally reviewed following labs and imaging studies  CBC: Recent Labs  Lab 11/29/18 1647  WBC 6.0  NEUTROABS 2.4  HGB 13.4  HCT 38.8*  MCV 88.8  PLT 657   Basic Metabolic Panel: Recent Labs  Lab 11/29/18 1647 11/30/18 0359  NA 130* 134*  K 3.9 3.9  CL 96* 103  CO2 23 23  GLUCOSE 111* 102*  BUN 10 9  CREATININE 0.68 0.73  CALCIUM 8.6* 8.2*   GFR: Estimated Creatinine Clearance: 77.1 mL/min (by C-G formula based on SCr of 0.73 mg/dL). Liver Function Tests: Recent Labs  Lab 11/29/18 1647  AST 13*  ALT 13  ALKPHOS 73  BILITOT 0.6  PROT 7.5  ALBUMIN 3.0*   Recent Labs  Lab 11/29/18 1647  LIPASE 18   No results for input(s): AMMONIA in the last 168 hours. Coagulation Profile: Recent Labs  Lab 11/29/18 2109  INR 1.1   Cardiac Enzymes: No results for input(s): CKTOTAL, CKMB, CKMBINDEX, TROPONINI in the last 168 hours. BNP (last 3 results) No results for input(s): PROBNP in the last 8760 hours. HbA1C: No results for input(s): HGBA1C in the last 72 hours. CBG: No results for input(s): GLUCAP in the last 168 hours. Lipid Profile: No results for input(s): CHOL, HDL, LDLCALC, TRIG, CHOLHDL, LDLDIRECT in the last 72 hours. Thyroid Function Tests: No results for input(s): TSH, T4TOTAL, FREET4, T3FREE, THYROIDAB in the last 72 hours. Anemia Panel: No results for input(s): VITAMINB12, FOLATE, FERRITIN, TIBC, IRON, RETICCTPCT in the last 72 hours. Sepsis Labs: Recent Labs  Lab 11/29/18 2109  LATICACIDVEN 0.9    Recent Results (from the past 240 hour(s))  Urine Culture     Status: None   Collection Time: 11/20/18  3:08 PM  Result Value Ref Range Status   MICRO NUMBER: [id number]  Final   SPECIMEN QUALITY: Adequate  Final   Sample Source NOT GIVEN  Final   STATUS: FINAL  Final   Result: No Growth  Final       Radiology Studies: Ct Abdomen Pelvis W Contrast  Result Date: 11/29/2018 CLINICAL DATA:   Recent sigmoid diverticulitis and abscess. Recurrent suprapubic pain. EXAM: CT ABDOMEN AND PELVIS WITH CONTRAST TECHNIQUE: Multidetector CT imaging of the abdomen and pelvis was performed using the standard protocol following bolus administration of intravenous contrast. CONTRAST:  154mL OMNIPAQUE IOHEXOL 300 MG/ML  SOLN COMPARISON:  Earlier today at [facility] FINDINGS: Lower chest: 6 mm nodule in the left lower lobe. No effusions. Heart is normal size. Hepatobiliary: No focal hepatic abnormality. Gallbladder unremarkable. Pancreas: No focal abnormality or ductal dilatation. Spleen: No focal abnormality.  Normal size. Adrenals/Urinary Tract: No adrenal abnormality. No focal renal abnormality. No stones or hydronephrosis. Urinary bladder is unremarkable. Mass effect from the fluid collection along the superior bladder wall. No fistulous communication noted. Stomach/Bowel: There Is sigmoid diverticulosis. Wall thickening noted in the mid sigmoid colon. Fluid collection noted likely within the wall of the sigmoid colon measuring up to 2.3 cm. There is adjacent fluid  collection along the superior bladder wall measuring 5.7 x 4.6 cm. No evidence of bowel obstruction. Vascular/Lymphatic: Aortic atherosclerosis. No enlarged abdominal or pelvic lymph nodes. Reproductive: Mildly prominent prostate. Other: No free fluid or free air. Musculoskeletal: No acute bony abnormality. IMPRESSION: Changes of sigmoid diverticulosis and diverticulitis. Probable intramural abscess within the mid sigmoid colon. Adjacent central pelvic fluid collection along the superior wall of the bladder measures 5.6 x 4.6 cm compatible with abscess. Mass effect on the bladder. No visible fistula. Electronically Signed   By: Rolm Baptise M.D.   On: 11/29/2018 20:40      Scheduled Meds: Continuous Infusions: . sodium chloride Stopped (11/30/18 0504)  . piperacillin-tazobactam (ZOSYN)  IV 12.5 mL/hr at 11/30/18 0600     LOS: 1  day    Time spent: 35 minutes   [name], DO [facility] [email or web address] 11/30/2018, 10:52 AM

## 2018-11-30 NOTE — Consult Note (Signed)
Chief Complaint: Patient was seen in consultation today for pelvic abscess aspiration/drain placement.  Referring Physician(s): Dr. [name]  Supervising Physician: Aletta Edouard  Patient Status: [facility] - In-pt  History of Present Illness: Adrian [name] is a 63 y.o. male with a past medical history significant for hypothyroidism, CAD, paroxysmal a.fib currently on Eliquis, history of sigmoid diverticulitis with abscess treated with IV abx (09/1718 - 09/26/18) and history of tonsillar cancer s/p chemotherapy and radiation who presented to [facility] ED on 11/29/18 with complaints of urinary retention, dysuria, hematuria, chills pelvic pain and abdominal pain. He was previously seen by urology for similar complaints and had called their office earlier that day due to worsening of pain with new onset of hematuria and urinary retention, he was advised to go to the ED for further evaluation. In the ED he was found to be afebrile with WBC 6.0, UA (+) for bacteria, leukocytes and >50 WBC/hpf - urine culture pending. A CT abdomen and pelvis with contrast was performed which showed a probable intramural abscess within the mid sigmoid colon as well as adjacent central pelvic fluid collection along the superior wall of the bladder measuring 5.6 x 4.6 cm compatible with abscess and mass effect of the bladder. Urology was consulted who recommended general surgery consultation. He was seen by Dr. Ninfa Linden for consultation who recommends percutaneous abscess drainage in IR if possible. IR has been consulted for possible pelvic abscess aspiration and drain placement.   Patient reports that he feels ok today, still having some lower abdominal pain but this has improved since yesterday. He is wondering when he can eat as he hasn't eaten anything in 3 days. He is hopeful this procedure will help him feel better and that he can go home soon. He states understanding of requested procedure and wishes to proceed.  Past  Medical History:  Diagnosis Date   CAD (coronary artery disease), native coronary artery 12/06/2015   a. 10/2015 MV: EF  37%, reversible defect inferior apex, intermediate risk findings; b. 10/2015 Cath: 20% mid RCA.   History of chemotherapy 2005   Cisplatin   Hypothyroidism    NICM (nonischemic cardiomyopathy) ([place]) 10/14/2015   a. Tachy mediated?;  b. Echo 3/17 - Mild concentric LVH, EF 30-35%, anteroseptal, anterior, anterolateral, apical anterior, lateral hypokinesis, trivial MR, mild to moderately reduced RVSF; c. LHC 3/17 - mRCA 20%   Paroxysmal atrial flutter ([place])    a. TEE 3/17 with ? LAA clot-->s/p TEE/DCCV 11/24/2015   Radiation NOv.3,2005-Dec. 15, 2005   6810 cGy in 30 fractions   Tonsil cancer ([place]) 2005    Past Surgical History:  Procedure Laterality Date   CARDIAC CATHETERIZATION N/A 10/18/2015   Procedure: Left Heart Cath and Coronary Angiography;  Surgeon: Burnell Blanks, MD;  Location: [place] CV LAB;  Service: Cardiovascular;  Laterality: N/A;   CARDIOVERSION N/A 11/24/2015   Procedure: CARDIOVERSION;  Surgeon: Josue Hector, MD;  Location[facility] ENDOSCOPY;  Service: Cardiovascular;  Laterality: N/A;   LAMINECTOMY     C5/placement of steel plate   NECK SURGERY  2003   replaced disk   TEE WITHOUT CARDIOVERSION N/A 10/15/2015   Procedure: TRANSESOPHAGEAL ECHOCARDIOGRAM (TEE);  Surgeon: Larey Dresser, MD;  Location: [place];  Service: Cardiovascular;  Laterality: N/A;   TEE WITHOUT CARDIOVERSION N/A 11/24/2015   Procedure: TRANSESOPHAGEAL ECHOCARDIOGRAM (TEE);  Surgeon: Josue Hector, MD;  Location: [facility] ENDOSCOPY;  Service: Cardiovascular;  Laterality: N/A;    Allergies: Patient has no known  allergies.  Medications: Prior to Admission medications   Medication Sig Start Date End Date Taking? Authorizing Provider  apixaban (ELIQUIS) 5 MG TABS tablet Take 1 tablet (5 mg total) by mouth 2 (two) times daily. 02/08/18  Yes Turner, Eber Hong, MD    ciprofloxacin (CIPRO) 500 MG tablet Take 1 tablet (500 mg total) by mouth 2 (two) times daily for 7 days. 11/26/18 12/03/18 Yes Burchette, Alinda Sierras, MD  levothyroxine (SYNTHROID, LEVOTHROID) 125 MCG tablet Take 1 tablet (125 mcg total) by mouth daily. 12/05/17  Yes Burchette, Alinda Sierras, MD  nebivolol (BYSTOLIC) 10 MG tablet Take 1 tablet (10 mg total) by mouth daily. 05/21/18  Yes Turner, Eber Hong, MD  OVER THE COUNTER MEDICATION Take 1 tablet by mouth as needed (for cold/pain). Alka seltzer   Yes [provider]  atorvastatin (LIPITOR) 40 MG tablet Take 1 tablet (40 mg total) by mouth daily. Please keep upcoming appt in September for future refills. Thank you Patient taking differently: Take 40 mg by mouth daily.  03/01/18   Imogene Burn, PA-C     Family History  Problem Relation Age of Onset   Prostate cancer Father    Colon cancer Father    Skin cancer Mother    Cancer Brother     Social History   Socioeconomic History   Marital status: Married    Spouse name: Not on file   Number of children: Not on file   Years of education: Not on file   Highest education level: Not on file  Occupational History   Not on file  Social Needs   Financial resource strain: Not on file   Food insecurity:    Worry: Not on file    Inability: Not on file   Transportation needs:    Medical: Not on file    Non-medical: Not on file  Tobacco Use   Smoking status: Current Every Day Smoker    Packs/day: 0.50    Years: 20.00    Pack years: 10.00    Types: Cigarettes   Smokeless tobacco: Never Used   Tobacco comment: 1/2 pack per day  Substance and Sexual Activity   Alcohol use: Yes    Alcohol/week: 12.0 standard drinks    Types: 12 Cans of beer per week    Comment: couple of beers each day   Drug use: No   Sexual activity: Not on file  Lifestyle   Physical activity:    Days per week: Not on file    Minutes per session: Not on file   Stress: Not on file   Relationships   Social connections:    Talks on phone: Not on file    Gets together: Not on file    Attends religious service: Not on file    Active member of club or organization: Not on file    Attends meetings of clubs or organizations: Not on file    Relationship status: Not on file  Other Topics Concern   Not on file  Social History Narrative   Not on file     Review of Systems: A 12 point ROS discussed and pertinent positives are indicated in the HPI above.  All other systems are negative.  Review of Systems  Constitutional: Positive for chills (intremittent; none currently). Negative for fever.  Respiratory: Negative for cough and shortness of breath.   Cardiovascular: Negative for chest pain.  Gastrointestinal: Positive for abdominal pain (lower). Negative for nausea and vomiting.  Neurological: Negative  for dizziness, syncope and headaches.    Vital Signs: BP (!) 138/54 (BP Location: Right Arm)    Pulse (!) 52    Temp 97.7 F (36.5 C) (Oral)    Resp 17    Ht 5\' 8"  (1.727 m)    Wt 127 lb 5 oz (57.7 kg)    SpO2 99%    BMI 19.36 kg/m   Physical Exam Vitals signs and nursing note reviewed.  Constitutional:      General: He is not in acute distress.    Appearance: Normal appearance.  HENT:     Head: Normocephalic.  Cardiovascular:     Rate and Rhythm: Normal rate and regular rhythm.  Pulmonary:     Effort: Pulmonary effort is normal.     Breath sounds: Normal breath sounds.  Abdominal:     General: There is no distension.     Palpations: Abdomen is soft.     Tenderness: There is abdominal tenderness (LLQ and RLQ).  Skin:    General: Skin is warm and dry.  Neurological:     Mental Status: He is alert and oriented to person, place, and time.  Psychiatric:        Mood and Affect: Mood normal.        Behavior: Behavior normal.        Thought Content: Thought content normal.        Judgment: Judgment normal.      MD Evaluation Airway: WNL Heart:  WNL Abdomen: WNL Chest/ Lungs: WNL ASA  Classification: 3 Mallampati/Airway Score: Two   Imaging: Ct Abdomen Pelvis W Contrast  Result Date: 11/29/2018 CLINICAL DATA:  Recent sigmoid diverticulitis and abscess. Recurrent suprapubic pain. EXAM: CT ABDOMEN AND PELVIS WITH CONTRAST TECHNIQUE: Multidetector CT imaging of the abdomen and pelvis was performed using the standard protocol following bolus administration of intravenous contrast. CONTRAST:  148mL OMNIPAQUE IOHEXOL 300 MG/ML  SOLN COMPARISON:  Earlier today at [facility] specialists FINDINGS: Lower chest: 6 mm nodule in the left lower lobe. No effusions. Heart is normal size. Hepatobiliary: No focal hepatic abnormality. Gallbladder unremarkable. Pancreas: No focal abnormality or ductal dilatation. Spleen: No focal abnormality.  Normal size. Adrenals/Urinary Tract: No adrenal abnormality. No focal renal abnormality. No stones or hydronephrosis. Urinary bladder is unremarkable. Mass effect from the fluid collection along the superior bladder wall. No fistulous communication noted. Stomach/Bowel: There Is sigmoid diverticulosis. Wall thickening noted in the mid sigmoid colon. Fluid collection noted likely within the wall of the sigmoid colon measuring up to 2.3 cm. There is adjacent fluid collection along the superior bladder wall measuring 5.7 x 4.6 cm. No evidence of bowel obstruction. Vascular/Lymphatic: Aortic atherosclerosis. No enlarged abdominal or pelvic lymph nodes. Reproductive: Mildly prominent prostate. Other: No free fluid or free air. Musculoskeletal: No acute bony abnormality. IMPRESSION: Changes of sigmoid diverticulosis and diverticulitis. Probable intramural abscess within the mid sigmoid colon. Adjacent central pelvic fluid collection along the superior wall of the bladder measures 5.6 x 4.6 cm compatible with abscess. Mass effect on the bladder. No visible fistula. Electronically Signed   By: Rolm Baptise M.D.   On: 11/29/2018  20:40    Labs:  CBC: Recent Labs    09/24/18 0153 09/25/18 0142 09/26/18 0232 11/29/18 1647  WBC 6.9 6.0 5.2 6.0  HGB 13.1 11.7* 12.2* 13.4  HCT 38.1* 34.5* 36.7* 38.8*  PLT 142* 143* 151 238    COAGS: Recent Labs    09/24/18 1907 11/29/18 2109  INR  --  1.1  APTT 47*  --     BMP: Recent Labs    09/23/18 0948 09/23/18 1531 09/24/18 0153 11/29/18 1647 11/30/18 0359  NA 135  --  139 130* 134*  K 3.8  --  4.0 3.9 3.9  CL 95*  --  104 96* 103  CO2 29  --  27 23 23   GLUCOSE 110*  --  87 111* 102*  BUN 10  --  10 10 9   CALCIUM 8.7*  --  8.2* 8.6* 8.2*  CREATININE 0.99 0.86 1.07 0.68 0.73  GFRNONAA >60 >60 >60 >60 >60  GFRAA >60 >60 >60 >60 >60    LIVER FUNCTION TESTS: Recent Labs    12/05/17 0931 09/23/18 0948 09/24/18 0153 11/29/18 1647  BILITOT 0.5 0.7 1.0 0.6  AST 32 18 14* 13*  ALT 23 12 10 13   ALKPHOS 68 61 51 73  PROT 7.1 7.3 5.9* 7.5  ALBUMIN 4.1 3.3* 2.5* 3.0*    TUMOR MARKERS: No results for input(s): AFPTM, CEA, CA199, CHROMGRNA in the last 8760 hours.  Assessment and Plan:  63 y/o M with previous history of sigmoid diverticulitis with abscess treated with PO abx and conservative measures in February of this year who presented to [facility] ED yesterday with complaints of urinary retention, dysuria, hematuria and abdominal/pelvic pain. CT showed a probable intramural abscess within the mid sigmoid colon as well as adjacent central pelvic fluid collection along the superior wall of the bladder measuring 5.6 x 4.6 cm compatible with abscess and mass effect of the bladder. Request has been made to IR for possible aspiration and drain placement - patient has been reviewed by Dr. Kathlene Cote who agrees to procedure.   Patient has been NPO since midnight, he does take Eliquis at home for paroxysmal a.fib with last dose 4/23. Afebrile, WBC 6.0, hgb 13.4, plt 238, INR 1.1.  Risks and benefits discussed with the patient including bleeding, infection, damage to  adjacent structures, bowel perforation/fistula connection, and sepsis.  All of the patient's questions were answered, patient is agreeable to proceed.  Consent signed and in chart.  Thank you for this interesting consult.  I greatly enjoyed meeting Adrian [name] and look forward to participating in their care.  A copy of this report was sent to the requesting provider on this date.  Electronically Signed: Joaquim Nam, PA-C 11/30/2018, 9:22 AM   I spent a total of 40 Minutes in face to face in clinical consultation, greater than 50% of which was counseling/coordinating care for abdominal abscess drain placement.

## 2018-12-01 LAB — URINE CULTURE: Culture: NO GROWTH

## 2018-12-01 LAB — CBC
HCT: 35.5 % — ABNORMAL LOW (ref 39.0–52.0)
Hemoglobin: 11.8 g/dL — ABNORMAL LOW (ref 13.0–17.0)
MCH: 30.6 pg (ref 26.0–34.0)
MCHC: 33.2 g/dL (ref 30.0–36.0)
MCV: 92.2 fL (ref 80.0–100.0)
Platelets: 213 10*3/uL (ref 150–400)
RBC: 3.85 MIL/uL — ABNORMAL LOW (ref 4.22–5.81)
RDW: 14.2 % (ref 11.5–15.5)
WBC: 5 10*3/uL (ref 4.0–10.5)
nRBC: 0 % (ref 0.0–0.2)

## 2018-12-01 LAB — BASIC METABOLIC PANEL
Anion gap: 8 (ref 5–15)
BUN: 10 mg/dL (ref 8–23)
CO2: 24 mmol/L (ref 22–32)
Calcium: 8 mg/dL — ABNORMAL LOW (ref 8.9–10.3)
Chloride: 103 mmol/L (ref 98–111)
Creatinine, Ser: 0.73 mg/dL (ref 0.61–1.24)
GFR calc Af Amer: >60 mL/min (ref >60)
GFR calc non Af Amer: >60 mL/min (ref >60)
Glucose, Bld: 88 mg/dL (ref 70–99)
Potassium: 3.6 mmol/L (ref 3.5–5.1)
Sodium: 135 mmol/L (ref 135–145)

## 2018-12-01 MED ORDER — ADULT MULTIVITAMIN W/MINERALS CH
1.0000 | ORAL_TABLET | Freq: Every day | ORAL | Status: DC
  Administered 2018-12-02 – 2018-12-04 (×3): 1 via ORAL
  Filled 2018-12-01 (×3): qty 1

## 2018-12-01 MED ORDER — PRO-STAT SUGAR FREE PO LIQD
30.0000 mL | Freq: Every day | ORAL | Status: DC
  Administered 2018-12-02 – 2018-12-03 (×2): 30 mL via ORAL
  Filled 2018-12-01 (×2): qty 30

## 2018-12-01 MED ORDER — BOOST / RESOURCE BREEZE PO LIQD CUSTOM
1.0000 | Freq: Two times a day (BID) | ORAL | Status: DC
  Administered 2018-12-01 – 2018-12-04 (×5): 1 via ORAL

## 2018-12-01 NOTE — Progress Notes (Signed)
Subjective/Chief Complaint: Feels much better after drain placement Passed a little flatus. Urination improved   Objective: Vital signs in last 24 hours: Temp:  [98 F (36.7 C)-98.3 F (36.8 C)] 98.2 F (36.8 C) (04/26 0530) Pulse Rate:  [51-57] 55 (04/26 0530) Resp:  [10-18] 16 (04/26 0530) BP: (100-137)/(59-75) 137/75 (04/26 0530) SpO2:  [95 %-100 %] 99 % (04/26 0530) Last BM Date: 11/28/18  Intake/Output from previous day: 04/25 0701 - 04/26 0700 In: 973.1 [I.V.:865.1; IV Piggyback:100] Out: 605 [Urine:500; Drains:105] Intake/Output this shift: Total I/O In: -  Out: 180 [Urine:150; Drains:30]  Exam: Looks much better than yesterday Abdomen soft, much less tender suprapubic Drain purulent  Lab Results:  Recent Labs    11/29/18 1647 12/01/18 0320  WBC 6.0 5.0  HGB 13.4 11.8*  HCT 38.8* 35.5*  PLT 238 213   BMET Recent Labs    11/30/18 0359 12/01/18 0320  NA 134* 135  K 3.9 3.6  CL 103 103  CO2 23 24  GLUCOSE 102* 88  BUN 9 10  CREATININE 0.73 0.73  CALCIUM 8.2* 8.0*   PT/INR Recent Labs    11/29/18 2109  LABPROT 13.9  INR 1.1   ABG No results for input(s): PHART, HCO3 in the last 72 hours.  Invalid input(s): PCO2, PO2  Studies/Results: Ct Abdomen Pelvis W Contrast  Result Date: 11/29/2018 CLINICAL DATA:  Recent sigmoid diverticulitis and abscess. Recurrent suprapubic pain. EXAM: CT ABDOMEN AND PELVIS WITH CONTRAST TECHNIQUE: Multidetector CT imaging of the abdomen and pelvis was performed using the standard protocol following bolus administration of intravenous contrast. CONTRAST:  127mL OMNIPAQUE IOHEXOL 300 MG/ML  SOLN COMPARISON:  Earlier today at [facility] FINDINGS: Lower chest: 6 mm nodule in the left lower lobe. No effusions. Heart is normal size. Hepatobiliary: No focal hepatic abnormality. Gallbladder unremarkable. Pancreas: No focal abnormality or ductal dilatation. Spleen: No focal abnormality.  Normal size.  Adrenals/Urinary Tract: No adrenal abnormality. No focal renal abnormality. No stones or hydronephrosis. Urinary bladder is unremarkable. Mass effect from the fluid collection along the superior bladder wall. No fistulous communication noted. Stomach/Bowel: There Is sigmoid diverticulosis. Wall thickening noted in the mid sigmoid colon. Fluid collection noted likely within the wall of the sigmoid colon measuring up to 2.3 cm. There is adjacent fluid collection along the superior bladder wall measuring 5.7 x 4.6 cm. No evidence of bowel obstruction. Vascular/Lymphatic: Aortic atherosclerosis. No enlarged abdominal or pelvic lymph nodes. Reproductive: Mildly prominent prostate. Other: No free fluid or free air. Musculoskeletal: No acute bony abnormality. IMPRESSION: Changes of sigmoid diverticulosis and diverticulitis. Probable intramural abscess within the mid sigmoid colon. Adjacent central pelvic fluid collection along the superior wall of the bladder measures 5.6 x 4.6 cm compatible with abscess. Mass effect on the bladder. No visible fistula. Electronically Signed   By: Rolm Baptise M.D.   On: 11/29/2018 20:40   Ct Image Guided Drainage By Percutaneous Catheter  Result Date: 11/30/2018 CLINICAL DATA:  Diverticulitis of the sigmoid colon with diverticular abscess. EXAM: CT GUIDED CATHETER DRAINAGE OF PERITONEAL ABSCESS ANESTHESIA/SEDATION: 2.0 mg IV Versed 100 mcg IV Fentanyl Total Moderate Sedation Time:  13 minutes The patient's level of consciousness and physiologic status were continuously monitored during the procedure by Radiology nursing. PROCEDURE: The procedure, risks, benefits, and alternatives were explained to the patient. Questions regarding the procedure were encouraged and answered. The patient understands and consents to the procedure. A time out was performed prior to initiating the procedure. CT was performed  of the pelvis in a supine position. The left lower abdominal wall was prepped with  chlorhexidine in a sterile fashion, and a sterile drape was applied covering the operative field. A sterile gown and sterile gloves were used for the procedure. Local anesthesia was provided with 1% Lidocaine. Under CT guidance, a 18 gauge trocar needle was advanced into a sigmoid diverticular abscess. Aspiration of fluid was performed. A fluid sample was sent for culture analysis. A guidewire was advanced through the needle and the needle removed. The tract was dilated and a 12 French percutaneous drain placed. Drainage catheter position was confirmed by CT. The drain was flushed and connected to a suction bulb. The drainage catheter was secured at the skin with a Prolene retention suture and StatLock device. COMPLICATIONS: None FINDINGS: Aspiration at the level of the diverticular abscess situated between the dome of the bladder and the sigmoid colon yielded purulent fluid. After placement of the drain, there is good return of thick, purulent fluid. IMPRESSION: CT-guided percutaneous catheter drainage of sigmoid diverticular abscess. A fluid sample was sent for culture analysis. A 12 French drainage catheter was placed and attached to suction bulb drainage. Electronically Signed   By: Aletta Edouard M.D.   On: 11/30/2018 15:24    Anti-infectives: Anti-infectives (From admission, onward)   Start     Dose/Rate Route Frequency Ordered Stop   11/29/18 2100  piperacillin-tazobactam (ZOSYN) IVPB 3.375 g     3.375 g 12.5 mL/hr over 240 Minutes Intravenous Every 8 hours 11/29/18 2030        Assessment/Plan: Diverticulitis with abscess and colovesicle fistula  S/p IR drain.  Improved.  Cultures pending Keep on clear liquids today Continue IV antibiotics Hopefully, he will continue to improve and may avoid the need for a colostomy  LOS: 2 days    Coralie Keens 12/01/2018

## 2018-12-01 NOTE — Progress Notes (Signed)
PROGRESS NOTE    Adrian [name]  JJO:[id number] DOB: Apr 23, 1956 DOA: 11/29/2018 PCP: [name], MD     Brief Narrative:  Adrian [name] is a 63 y.o. male with medical history significant of CAD, hypothyroidism, paroxysmal atrial fibrillation, history of tonsillar cancer being sent to the hospital from his urologist for surgery.  Patient was admitted from September 23, 2020 October 04, 2018 for acute sigmoid diverticulitis with abscess.  He was treated conservatively with IV Zosyn during his hospitalization and discharged home on a 14-day course of Augmentin.   Patient reports 3-week history of dysuria, urinary frequency, and urgency.  States symptoms have been getting progressively worse.  He is having excruciating pain every time he urinates and is noticing blood in his urine.  States his primary care doctor checked his urine and recently treated him for a UTI with an antibiotic but he continues to have symptoms.  States he was seen by his urologist and they did a CT scan.  His urologist told him that he had a fistula in his bladder and he had to come into the hospital.  He has been having fevers and chills.  Also complaining of generalized abdominal pain.  Denies any nausea, vomiting, or diarrhea.  Denies any shortness of breath, cough, or chest pain.  He underwent CT-guided drain placement in the abscess on 4/25.  He remains on IV Zosyn.  New events last 24 hours / Subjective: Feeling better since drain placement.  Still has some lower abdominal pain, tolerating clear liquids and having an appetite.  No nausea or vomiting.  Still has some dysuria.  Assessment & Plan:   Principal Problem:   Abscess of sigmoid colon due to diverticulitis Active Problems:   UTI (urinary tract infection)   Hyponatremia   AF (paroxysmal atrial fibrillation) (HCC)   Acute sigmoid diverticulitis with abscess -CT A/P: Changes of sigmoid diverticulosis and diverticulitis. Probable intramural abscess within  the mid sigmoid colon. Adjacent central pelvic fluid collection along the superior wall of the bladder measures 5.6 x 4.6 cm compatible with abscess. Mass effect on the bladder. No visible fistula. -General surgery following -Status post IR drain of the abscess on 4/25 -culture pending -Continue IV Zosyn -Pain control -Clear liquid diet per general surgery  Bacteriuria -UA showed many bacteria, greater than 50 WBC, moderate leukocytes -Urine culture negative -Pyridium for dysuria  Paroxysmal atrial fibrillation -Continue to hold Eliquis, hopefully he will not require surgical intervention with improvement    DVT prophylaxis: Eliquis on hold, SCD Code Status: Full Family Communication: None Disposition Plan: Continue to monitor on IV antibiotics for improvement    Consultants:   IR  General Surgery  Procedures:   S/p CT guided drainage diverticular abscess 4/25   Antimicrobials:  Anti-infectives (From admission, onward)   Start     Dose/Rate Route Frequency Ordered Stop   11/29/18 2100  piperacillin-tazobactam (ZOSYN) IVPB 3.375 g     3.375 g 12.5 mL/hr over 240 Minutes Intravenous Every 8 hours 11/29/18 2030         Objective: Vitals:   11/30/18 1320 11/30/18 1339 11/30/18 2206 12/01/18 0530  BP: (!) 114/59 100/62 123/70 137/75  Pulse: (!) 55 (!) 51 (!) 57 (!) 55  Resp: 16 14 16 16   Temp:  98 F (36.7 C) 98.3 F (36.8 C) 98.2 F (36.8 C)  TempSrc:  Oral Oral Oral  SpO2: 95% 100% 96% 99%  Weight:      Height:  Intake/Output Summary (Last 24 hours) at 12/01/2018 0911 Last data filed at 12/01/2018 0852 Gross per 24 hour  Intake 973.13 ml  Output 785 ml  Net 188.13 ml   Filed Weights   11/29/18 2113  Weight: 57.7 kg    Examination: General exam: Appears calm and comfortable  Respiratory system: Clear to auscultation. Respiratory effort normal. Cardiovascular system: S1 & S2 heard, RRR. No JVD, murmurs, rubs, gallops or clicks. No pedal  edema. Gastrointestinal system: Abdomen is nondistended, soft and TTP lower abdomen. +abscess drainage with serosanguinous fluid  Central nervous system: Alert and oriented. No focal neurological deficits. Extremities: Symmetric 5 x 5 power. Skin: No rashes, lesions or ulcers Psychiatry: Judgement and insight appear normal. Mood & affect appropriate.    Data Reviewed: I have personally reviewed following labs and imaging studies  CBC: Recent Labs  Lab 11/29/18 1647 12/01/18 0320  WBC 6.0 5.0  NEUTROABS 2.4  --   HGB 13.4 11.8*  HCT 38.8* 35.5*  MCV 88.8 92.2  PLT 238 397   Basic Metabolic Panel: Recent Labs  Lab 11/29/18 1647 11/30/18 0359 12/01/18 0320  NA 130* 134* 135  K 3.9 3.9 3.6  CL 96* 103 103  CO2 23 23 24   GLUCOSE 111* 102* 88  BUN 10 9 10   CREATININE 0.68 0.73 0.73  CALCIUM 8.6* 8.2* 8.0*   GFR: Estimated Creatinine Clearance: 77.1 mL/min (by C-G formula based on SCr of 0.73 mg/dL). Liver Function Tests: Recent Labs  Lab 11/29/18 1647  AST 13*  ALT 13  ALKPHOS 73  BILITOT 0.6  PROT 7.5  ALBUMIN 3.0*   Recent Labs  Lab 11/29/18 1647  LIPASE 18   No results for input(s): AMMONIA in the last 168 hours. Coagulation Profile: Recent Labs  Lab 11/29/18 2109  INR 1.1   Cardiac Enzymes: No results for input(s): CKTOTAL, CKMB, CKMBINDEX, TROPONINI in the last 168 hours. BNP (last 3 results) No results for input(s): PROBNP in the last 8760 hours. HbA1C: No results for input(s): HGBA1C in the last 72 hours. CBG: No results for input(s): GLUCAP in the last 168 hours. Lipid Profile: No results for input(s): CHOL, HDL, LDLCALC, TRIG, CHOLHDL, LDLDIRECT in the last 72 hours. Thyroid Function Tests: No results for input(s): TSH, T4TOTAL, FREET4, T3FREE, THYROIDAB in the last 72 hours. Anemia Panel: No results for input(s): VITAMINB12, FOLATE, FERRITIN, TIBC, IRON, RETICCTPCT in the last 72 hours. Sepsis Labs: Recent Labs  Lab 11/29/18 2109    LATICACIDVEN 0.9    Recent Results (from the past 240 hour(s))  Culture, Urine     Status: None   Collection Time: 11/29/18  4:47 PM  Result Value Ref Range Status   Specimen Description   Final    Urine Performed at [facility] [address], [place], [place] [address]    Special Requests   Final    NONE Performed at [facility], [place] [address] [place], [place] [address]    Culture   Final    NO GROWTH Performed at [facility], [place] [address] [place],  [address]    Report Status 12/01/2018 FINAL  Final       Radiology Studies: Ct Abdomen Pelvis W Contrast  Result Date: 11/29/2018 CLINICAL DATA:  Recent sigmoid diverticulitis and abscess. Recurrent suprapubic pain. EXAM: CT ABDOMEN AND PELVIS WITH CONTRAST TECHNIQUE: Multidetector CT imaging of the abdomen and pelvis was performed using the standard protocol following bolus administration of intravenous contrast. CONTRAST:  128mL OMNIPAQUE IOHEXOL 300 MG/ML  SOLN COMPARISON:  Earlier today at [facility] FINDINGS: Lower chest: 6 mm nodule in the left lower lobe. No effusions. Heart is normal size. Hepatobiliary: No focal hepatic abnormality. Gallbladder unremarkable. Pancreas: No focal abnormality or ductal dilatation. Spleen: No focal abnormality.  Normal size. Adrenals/Urinary Tract: No adrenal abnormality. No focal renal abnormality. No stones or hydronephrosis. Urinary bladder is unremarkable. Mass effect from the fluid collection along the superior bladder wall. No fistulous communication noted. Stomach/Bowel: There Is sigmoid diverticulosis. Wall thickening noted in the mid sigmoid colon. Fluid collection noted likely within the wall of the sigmoid colon measuring up to 2.3 cm. There is adjacent fluid collection along the superior bladder wall measuring 5.7 x 4.6 cm. No evidence of bowel obstruction. Vascular/Lymphatic: Aortic atherosclerosis. No enlarged  abdominal or pelvic lymph nodes. Reproductive: Mildly prominent prostate. Other: No free fluid or free air. Musculoskeletal: No acute bony abnormality. IMPRESSION: Changes of sigmoid diverticulosis and diverticulitis. Probable intramural abscess within the mid sigmoid colon. Adjacent central pelvic fluid collection along the superior wall of the bladder measures 5.6 x 4.6 cm compatible with abscess. Mass effect on the bladder. No visible fistula. Electronically Signed   By: Rolm Baptise M.D.   On: 11/29/2018 20:40   Ct Image Guided Drainage By Percutaneous Catheter  Result Date: 11/30/2018 CLINICAL DATA:  Diverticulitis of the sigmoid colon with diverticular abscess. EXAM: CT GUIDED CATHETER DRAINAGE OF PERITONEAL ABSCESS ANESTHESIA/SEDATION: 2.0 mg IV Versed 100 mcg IV Fentanyl Total Moderate Sedation Time:  13 minutes The patient's level of consciousness and physiologic status were continuously monitored during the procedure by Radiology nursing. PROCEDURE: The procedure, risks, benefits, and alternatives were explained to the patient. Questions regarding the procedure were encouraged and answered. The patient understands and consents to the procedure. A time out was performed prior to initiating the procedure. CT was performed of the pelvis in a supine position. The left lower abdominal wall was prepped with chlorhexidine in a sterile fashion, and a sterile drape was applied covering the operative field. A sterile gown and sterile gloves were used for the procedure. Local anesthesia was provided with 1% Lidocaine. Under CT guidance, a 18 gauge trocar needle was advanced into a sigmoid diverticular abscess. Aspiration of fluid was performed. A fluid sample was sent for culture analysis. A guidewire was advanced through the needle and the needle removed. The tract was dilated and a 12 French percutaneous drain placed. Drainage catheter position was confirmed by CT. The drain was flushed and connected to a  suction bulb. The drainage catheter was secured at the skin with a Prolene retention suture and StatLock device. COMPLICATIONS: None FINDINGS: Aspiration at the level of the diverticular abscess situated between the dome of the bladder and the sigmoid colon yielded purulent fluid. After placement of the drain, there is good return of thick, purulent fluid. IMPRESSION: CT-guided percutaneous catheter drainage of sigmoid diverticular abscess. A fluid sample was sent for culture analysis. A 12 French drainage catheter was placed and attached to suction bulb drainage. Electronically Signed   By: Aletta Edouard M.D.   On: 11/30/2018 15:24      Scheduled Meds:  phenazopyridine  100 mg Oral TID WC   sodium chloride flush  10 mL Intracatheter Q8H   Continuous Infusions:  sodium chloride Stopped (11/30/18 0504)   sodium chloride 75 mL/hr at 12/01/18 0200   piperacillin-tazobactam (ZOSYN)  IV 3.375 g (12/01/18 0423)     LOS: 2 days  Time spent: 25 minutes   [name], DO [facility] [email or web address] 12/01/2018, 9:11 AM

## 2018-12-01 NOTE — Progress Notes (Signed)
Initial Nutrition Assessment  RD working remotely.   DOCUMENTATION CODES:   (unable to assess for malnutrition at this time.)  INTERVENTION:  - will order boost breeze BID, each supplement provides 250 kcal and 9 grams of protein. - Will order 30 ml prostat once/day, each supplement provides 100 kcal and 15 grams of protein. - will order daily multivitamin with minerals. - continue to encourage PO intakes.  - diet advancement as medically feasible.   NUTRITION DIAGNOSIS:   Increased nutrient needs related to acute illness as evidenced by estimated needs.  GOAL:   Patient will meet greater than or equal to 90% of their needs  MONITOR:   PO intake, Supplement acceptance, Diet advancement, Labs, Weight trends, I & O's  REASON FOR ASSESSMENT:   Malnutrition Screening Tool  ASSESSMENT:   63 y.o. male with medical history significant of CAD, hypothyroidism, atrial fibrillation, and hx of tonsillar cancer. Patient was sent to the hospital from Urology office for surgery. Patient had been admitted 2/17-2/28 for acute sigmoid diverticulitis with abscess.  He was treated conservatively with IV Zosyn during his hospitalization and discharged home on a 14-day course of Augmentin. He now reports a 3-week history of dysuria, urinary frequency, and urinary urgency; reports symptoms have progressively worsened.  He is having excruciating pain every time he urinates and is noticing blood in his urine. He denied any N/V/D PTA. He underwent CT-guided drain placement in the abscess on 4/25.  BMI indicates normal weight. Diet advanced from NPO to CLD yesterday at 3:30 PM with no PO intakes documented since that time. Patient reports ongoing abdominal pain/pressure for 3 weeks PTA. Due to pain, patient was disinterested in eating and had a poor appetite during that time. He does not feel he had any nausea 2/2 severe abdominal pain, no vomiting PTA.   Per chart review, current weight is 127 lb and  weight on 09/24/18 was 134 lb. This indicates 7 lb weight loss (5% body weight) in the past 2 months; not significant for time frame.   Per Dr. Jeannine Kitten note this AM: acute sigmoid diverticulitis with abscess s/p IR drainage 4/25, bacteriuria.   Per Dr. Trevor Mace note this AM: patient has been passing flatus and urination has improved following drainage of abscess. Plan to keep on CLD today. Hopeful for ability to avoid colostomy.    Medications reviewed. Labs reviewed; Ca: 8 mg/dl.     NUTRITION - FOCUSED PHYSICAL EXAM:  unable to complete at this time.   Diet Order:   Diet Order            Diet clear liquid Room service appropriate? Yes; Fluid consistency: Thin  Diet effective now              EDUCATION NEEDS:   Not appropriate for education at this time  Skin:  Skin Assessment: Reviewed RN Assessment  Last BM:  4/23  Height:   Ht Readings from Last 1 Encounters:  11/29/18 5\' 8"  (1.727 m)    Weight:   Wt Readings from Last 1 Encounters:  11/29/18 57.7 kg    Ideal Body Weight:  70 kg  BMI:  Body mass index is 19.36 kg/m.  Estimated Nutritional Needs:   Kcal:  1750-2020 kcal  Protein:  85-100 grams  Fluid:  >/= 2 L/day     Jarome Matin, MS, RD, LDN, [facility] Inpatient Clinical Dietitian Pager # [phone] After hours/weekend pager # [phone]

## 2018-12-02 DIAGNOSIS — Z7901 Long term (current) use of anticoagulants: Secondary | ICD-10-CM

## 2018-12-02 DIAGNOSIS — I482 Chronic atrial fibrillation, unspecified: Secondary | ICD-10-CM

## 2018-12-02 LAB — BASIC METABOLIC PANEL
Anion gap: 8 (ref 5–15)
BUN: 6 mg/dL — ABNORMAL LOW (ref 8–23)
CO2: 25 mmol/L (ref 22–32)
Calcium: 8.2 mg/dL — ABNORMAL LOW (ref 8.9–10.3)
Chloride: 102 mmol/L (ref 98–111)
Creatinine, Ser: 0.66 mg/dL (ref 0.61–1.24)
GFR calc Af Amer: >60 mL/min (ref >60)
GFR calc non Af Amer: >60 mL/min (ref >60)
Glucose, Bld: 98 mg/dL (ref 70–99)
Potassium: 3.6 mmol/L (ref 3.5–5.1)
Sodium: 135 mmol/L (ref 135–145)

## 2018-12-02 LAB — CBC
HCT: 36.2 % — ABNORMAL LOW (ref 39.0–52.0)
Hemoglobin: 12.3 g/dL — ABNORMAL LOW (ref 13.0–17.0)
MCH: 30.8 pg (ref 26.0–34.0)
MCHC: 34 g/dL (ref 30.0–36.0)
MCV: 90.5 fL (ref 80.0–100.0)
Platelets: 253 10*3/uL (ref 150–400)
RBC: 4 MIL/uL — ABNORMAL LOW (ref 4.22–5.81)
RDW: 13.9 % (ref 11.5–15.5)
WBC: 4.5 10*3/uL (ref 4.0–10.5)
nRBC: 0 % (ref 0.0–0.2)

## 2018-12-02 MED ORDER — NEBIVOLOL HCL 10 MG PO TABS
10.0000 mg | ORAL_TABLET | Freq: Every day | ORAL | Status: DC
  Administered 2018-12-03 – 2018-12-04 (×2): 10 mg via ORAL
  Filled 2018-12-02 (×3): qty 1

## 2018-12-02 MED ORDER — LEVOTHYROXINE SODIUM 25 MCG PO TABS
125.0000 ug | ORAL_TABLET | Freq: Every day | ORAL | Status: DC
  Administered 2018-12-02 – 2018-12-04 (×3): 125 ug via ORAL
  Filled 2018-12-02 (×3): qty 1

## 2018-12-02 MED ORDER — ATORVASTATIN CALCIUM 40 MG PO TABS
40.0000 mg | ORAL_TABLET | Freq: Every day | ORAL | Status: DC
  Administered 2018-12-02 – 2018-12-04 (×3): 40 mg via ORAL
  Filled 2018-12-02 (×3): qty 1

## 2018-12-02 NOTE — Progress Notes (Signed)
Called MD in reference to BP is 99/52, pulse is 52. Order given to hold bp med for now.

## 2018-12-02 NOTE — Progress Notes (Signed)
Patient ID: Adrian [name], male   DOB: 1956-07-23, 63 y.o.   MRN: [id number] IR f/u note via telephone with nurse: Status post left lower quadrant diverticular abscess drainage on 11/30/18; pt stable, afebrile; WBC nl; creat nl, hgb stable; drain fluid cx pend; output 355 cc; drain flushes without difficulty; cont current tx; obtain f/u CT within 1 week of placement; other plans as per CCS/TRH.

## 2018-12-02 NOTE — Progress Notes (Signed)
PROGRESS NOTE    [name]  OFB:[id number] DOB: 22-May-1956 DOA: 11/29/2018 PCP: [name], MD     Brief Narrative:  Adrian [name] is a 63 y.o. male with medical history significant of CAD, hypothyroidism, paroxysmal atrial fibrillation, history of tonsillar cancer being sent to the hospital from his urologist for surgery.  Patient was admitted from September 23, 2020 October 04, 2018 for acute sigmoid diverticulitis with abscess.  He was treated conservatively with IV Zosyn during his hospitalization and discharged home on a 14-day course of Augmentin.   Patient reports 3-week history of dysuria, urinary frequency, and urgency.  States symptoms have been getting progressively worse.  He is having excruciating pain every time he urinates and is noticing blood in his urine.  States his primary care doctor checked his urine and recently treated him for a UTI with an antibiotic but he continues to have symptoms.  States he was seen by his urologist and they did a CT scan.  His urologist told him that he had a fistula in his bladder and he had to come into the hospital.  He has been having fevers and chills.  Also complaining of generalized abdominal pain.  Denies any nausea, vomiting, or diarrhea.  Denies any shortness of breath, cough, or chest pain.  He underwent CT-guided drain placement in the abscess on 4/25.  He remains on IV Zosyn.  New events last 24 hours / Subjective: Wants to go home, wants to advance his diet. Pain has improved. Ambulating in hall   Assessment & Plan:   Principal Problem:   Diverticulitis of large intestine with abscess Active Problems:   NICM (nonischemic cardiomyopathy) (HCC)   CAD (coronary artery disease), native coronary artery   Abscess of sigmoid colon due to diverticulitis   UTI (urinary tract infection)   Hyponatremia   AF (paroxysmal atrial fibrillation) (HCC)   Chronic atrial fibrillation   Current use of long term anticoagulation   Acute  sigmoid diverticulitis with abscess -CT A/P: Changes of sigmoid diverticulosis and diverticulitis. Probable intramural abscess within the mid sigmoid colon. Adjacent central pelvic fluid collection along the superior wall of the bladder measures 5.6 x 4.6 cm compatible with abscess. Mass effect on the bladder. No visible fistula. -General surgery following -Status post IR drain of the abscess on 4/25 -culture pending -Continue IV Zosyn -Pain control  Bacteriuria -UA showed many bacteria, greater than 50 WBC, moderate leukocytes -Urine culture negative -Pyridium for dysuria  Paroxysmal atrial fibrillation -Continue to hold Eliquis until cleared by surgery team to resume    DVT prophylaxis: Eliquis on hold, SCD Code Status: Full Family Communication: None Disposition Plan: Continue to monitor on IV antibiotics for improvement    Consultants:   IR  General Surgery  Procedures:   S/p CT guided drainage diverticular abscess 4/25   Antimicrobials:  Anti-infectives (From admission, onward)   Start     Dose/Rate Route Frequency Ordered Stop   11/29/18 2100  piperacillin-tazobactam (ZOSYN) IVPB 3.375 g     3.375 g 12.5 mL/hr over 240 Minutes Intravenous Every 8 hours 11/29/18 2030         Objective: Vitals:   12/01/18 0530 12/01/18 1452 12/01/18 2150 12/02/18 0446  BP: 137/75 (!) 143/69 129/64 126/62  Pulse: (!) 55 (!) 58 (!) 54 (!) 54  Resp: 16 16 16 16   Temp: 98.2 F (36.8 C) 98.2 F (36.8 C) 98.1 F (36.7 C) 98.6 F (37 C)  TempSrc: Oral Oral Oral Oral  SpO2: 99% 100% 96% 97%  Weight:      Height:        Intake/Output Summary (Last 24 hours) at 12/02/2018 1235 Last data filed at 12/02/2018 1610 Gross per 24 hour  Intake 660 ml  Output 1875 ml  Net -1215 ml   Filed Weights   11/29/18 2113  Weight: 57.7 kg    Examination: General exam: Appears calm and comfortable  Respiratory system: Clear to auscultation. Respiratory effort normal. Cardiovascular  system: S1 & S2 heard, RRR. No JVD, murmurs, rubs, gallops or clicks. No pedal edema. Gastrointestinal system: Abdomen is nondistended, soft and TTP pelvic, +drain in place with light serosanguinous fluid  Central nervous system: Alert and oriented. No focal neurological deficits. Extremities: Symmetric 5 x 5 power. Skin: No rashes, lesions or ulcers Psychiatry: Judgement and insight appear normal. Mood & affect appropriate.    Data Reviewed: I have personally reviewed following labs and imaging studies  CBC: Recent Labs  Lab 11/29/18 1647 12/01/18 0320 12/02/18 0347  WBC 6.0 5.0 4.5  NEUTROABS 2.4  --   --   HGB 13.4 11.8* 12.3*  HCT 38.8* 35.5* 36.2*  MCV 88.8 92.2 90.5  PLT 238 213 960   Basic Metabolic Panel: Recent Labs  Lab 11/29/18 1647 11/30/18 0359 12/01/18 0320 12/02/18 0347  NA 130* 134* 135 135  K 3.9 3.9 3.6 3.6  CL 96* 103 103 102  CO2 23 23 24 25   GLUCOSE 111* 102* 88 98  BUN 10 9 10  6*  CREATININE 0.68 0.73 0.73 0.66  CALCIUM 8.6* 8.2* 8.0* 8.2*   GFR: Estimated Creatinine Clearance: 77.1 mL/min (by C-G formula based on SCr of 0.66 mg/dL). Liver Function Tests: Recent Labs  Lab 11/29/18 1647  AST 13*  ALT 13  ALKPHOS 73  BILITOT 0.6  PROT 7.5  ALBUMIN 3.0*   Recent Labs  Lab 11/29/18 1647  LIPASE 18   No results for input(s): AMMONIA in the last 168 hours. Coagulation Profile: Recent Labs  Lab 11/29/18 2109  INR 1.1   Cardiac Enzymes: No results for input(s): CKTOTAL, CKMB, CKMBINDEX, TROPONINI in the last 168 hours. BNP (last 3 results) No results for input(s): PROBNP in the last 8760 hours. HbA1C: No results for input(s): HGBA1C in the last 72 hours. CBG: No results for input(s): GLUCAP in the last 168 hours. Lipid Profile: No results for input(s): CHOL, HDL, LDLCALC, TRIG, CHOLHDL, LDLDIRECT in the last 72 hours. Thyroid Function Tests: No results for input(s): TSH, T4TOTAL, FREET4, T3FREE, THYROIDAB in the last 72 hours.  Anemia Panel: No results for input(s): VITAMINB12, FOLATE, FERRITIN, TIBC, IRON, RETICCTPCT in the last 72 hours. Sepsis Labs: Recent Labs  Lab 11/29/18 2109  LATICACIDVEN 0.9    Recent Results (from the past 240 hour(s))  Culture, Urine     Status: None   Collection Time: 11/29/18  4:47 PM  Result Value Ref Range Status   Specimen Description   Final    Urine Performed at [facility] [address] [place], [place] [address]    Special Requests   Final    NONE Performed at [facility], [place] [address], [place], [place] [address]    Culture   Final    NO GROWTH Performed at [facility], [place] [address], [place],  [address]    Report Status 12/01/2018 FINAL  Final  Aerobic/Anaerobic Culture (surgical/deep wound)     Status: None (Preliminary result)   Collection Time:  11/30/18  1:16 PM  Result Value Ref Range Status   Specimen Description   Final    ABSCESS Performed at [facility] [address], [place], [place] [address]    Special Requests   Final    Normal Performed at [facility], [place] [address] [place], [place] [address]    Gram Stain   Final    ABUNDANT WBC PRESENT, PREDOMINANTLY PMN MODERATE GRAM POSITIVE COCCI    Culture   Final    FEW GRAM NEGATIVE RODS CULTURE REINCUBATED FOR BETTER GROWTH Performed at [facility], [place] [address], [place], [place] [address]    Report Status PENDING  Incomplete       Radiology Studies: Ct Image Guided Drainage By Percutaneous Catheter  Result Date: 11/30/2018 CLINICAL DATA:  Diverticulitis of the sigmoid colon with diverticular abscess. EXAM: CT GUIDED CATHETER DRAINAGE OF PERITONEAL ABSCESS ANESTHESIA/SEDATION: 2.0 mg IV Versed 100 mcg IV Fentanyl Total Moderate Sedation Time:  13 minutes The patient's level of consciousness and physiologic status were continuously monitored during the procedure by Radiology nursing.  PROCEDURE: The procedure, risks, benefits, and alternatives were explained to the patient. Questions regarding the procedure were encouraged and answered. The patient understands and consents to the procedure. A time out was performed prior to initiating the procedure. CT was performed of the pelvis in a supine position. The left lower abdominal wall was prepped with chlorhexidine in a sterile fashion, and a sterile drape was applied covering the operative field. A sterile gown and sterile gloves were used for the procedure. Local anesthesia was provided with 1% Lidocaine. Under CT guidance, a 18 gauge trocar needle was advanced into a sigmoid diverticular abscess. Aspiration of fluid was performed. A fluid sample was sent for culture analysis. A guidewire was advanced through the needle and the needle removed. The tract was dilated and a 12 French percutaneous drain placed. Drainage catheter position was confirmed by CT. The drain was flushed and connected to a suction bulb. The drainage catheter was secured at the skin with a Prolene retention suture and StatLock device. COMPLICATIONS: None FINDINGS: Aspiration at the level of the diverticular abscess situated between the dome of the bladder and the sigmoid colon yielded purulent fluid. After placement of the drain, there is good return of thick, purulent fluid. IMPRESSION: CT-guided percutaneous catheter drainage of sigmoid diverticular abscess. A fluid sample was sent for culture analysis. A 12 French drainage catheter was placed and attached to suction bulb drainage. Electronically Signed   By: Aletta Edouard M.D.   On: 11/30/2018 15:24      Scheduled Meds: . atorvastatin  40 mg Oral Daily  . feeding supplement  1 Container Oral BID BM  . feeding supplement (PRO-STAT SUGAR FREE 64)  30 mL Oral Daily  . levothyroxine  125 mcg Oral Daily  . multivitamin with minerals  1 tablet Oral Daily  . nebivolol  10 mg Oral Daily  . phenazopyridine  100 mg Oral  TID WC  . sodium chloride flush  10 mL Intracatheter Q8H   Continuous Infusions: . sodium chloride Stopped (11/30/18 0504)  . piperacillin-tazobactam (ZOSYN)  IV 3.375 g (12/02/18 5009)     LOS: 3 days    Time spent: 25 minutes   [name], DO [facility] [email or web address] 12/02/2018, 12:35 PM

## 2018-12-02 NOTE — Progress Notes (Addendum)
Adrian [name] [id number] 05/16/1956  CARE TEAM:  PCP: Adrian Post, [name]  Outpatient Care Team: Patient Care Team: Adrian Post, [name] as PCP - General (Family Medicine)  Inpatient Treatment Team: Treatment Team: Attending Provider: [name], DO; Consulting Physician: Adrian Pace Md, [name]; Registered Nurse: Adrian Meuse, RN; Technician: Adrian [name], NT; Rounding Team: Adrian Das, [name]; Registered Nurse: Adrian Meckel, RN; Rounding Team: Adrian [name] Radiology, [name]; Technician: Adrian [name], [place], [place]; Case Manager: Adrian Rider, RN   Problem List:   Principal Problem:   Diverticulitis of large intestine with abscess Active Problems:   NICM (nonischemic cardiomyopathy) ([place])   CAD (coronary artery disease), native coronary artery   Abscess of sigmoid colon due to diverticulitis   UTI (urinary tract infection)   Hyponatremia   AF (paroxysmal atrial fibrillation) ([place])   Chronic atrial fibrillation   Current use of long term anticoagulation      * No surgery found *      Assessment  Diverticular abscess improved with percutaneous drainage  [facility] Stay = 3 days)  Plan:  -He is not ready for discharge today despite [name] wishing it so.  -Continue IV antibiotics.  Follow-up on cultures.  Suspect he will need 7-10-day course of antibiotics total.  Can convert to oral upon discharge.  He was on Augmentin last time, so perhaps switch to Cipro/Flagyl unless culture shows something different that would not be covered.  -Gradually advance diet.  Full/dysphagia 1 now.  Perhaps advance to soft diet soon.  -Drain care.  Most likely will need to go home with the drain with follow-up outpatient drain study.  High risk for developing colovesical fistula although no evidence yet clinically despite the massive abscess severely pushing on his bladder.  Clinically better.  Given this complex attack that required readmission, I think at some point he  would benefit from segmental colectomy to remove the problem area.  Hopefully including this down with drainage and antibiotics, we can convert this to a 1 stage minimally invasive procedure.  Robotic sigmoid colectomy and possible colovesical fistula repair in 6 weeks.    Cardiomyopathy and atrial fibrillation on chronic anticoagulation.  Would require cardiac clearance.  I believe Dr. Fransico [name] is his usual cardiologist.  He is overdue for colonoscopy.  Would want to make sure that is done preoperatively to rule out evidence of cancer causing the abscess and perforation.  Make sure there is no other endoluminal problems in his colon.  Reasonable to try and coordinate so that he gets colonoscopy the day before surgery, therefore only having to deal with one bowel prep.  -VTE prophylaxis- SCDs, etc -mobilize as tolerated to help recovery  D/C patient from hospital when patient meets criteria (anticipate in 1-3 day(s)):  Tolerating oral intake well Ambulating well Adequate pain control without IV medications Urinating  Having flatus Disposition planning in place   30 minutes spent in review, evaluation, examination, counseling, and coordination of care.  More than 50% of that time was spent in counseling.  12/02/2018    Subjective: (Chief complaint)  Feels much better although feels tired.  Very appreciative care.  Tolerating liquids.  Wants to eat  Wants to go home.  Objective:  Vital signs:  Vitals:   12/01/18 0530 12/01/18 1452 12/01/18 2150 12/02/18 0446  BP: 137/75 (!) 143/69 129/64 126/62  Pulse: (!) 55 (!) 58 (!) 54 (!) 54  Resp: 16 16 16 16   Temp: 98.2 F (36.8 C) 98.2 F (  36.8 C) 98.1 F (36.7 C) 98.6 F (37 C)  TempSrc: Oral Oral Oral Oral  SpO2: 99% 100% 96% 97%  Weight:      Height:        Last BM Date: 12/01/18  Intake/Output   Yesterday:  04/26 0701 - 04/27 0700 In: 31 [P.O.:360; I.V.:10; IV Piggyback:50] Out: 1905 [Urine:1550;  Drains:355] This shift:  Total I/O In: 240 [P.O.:240] Out: 150 [Urine:150]  Bowel function:  Flatus: YES  BM:  No  Drain: Light reddish-brown.   Physical Exam:  General: Pt awake/alert/oriented x4 in no acute distress.  Thin but not cachectic Eyes: PERRL, normal EOM.  Sclera clear.  No icterus Neuro: CN II-XII intact w/o focal sensory/motor deficits. Lymph: No head/neck/groin lymphadenopathy Psych:  No delerium/psychosis/paranoia HENT: Normocephalic, Mucus membranes moist.  No thrush Neck: Supple, No tracheal deviation Chest: No chest wall pain w good excursion CV:  Pulses intact.  Regular rhythm MS: Normal AROM mjr joints.  No obvious deformity  Abdomen: Soft.  Nondistended.  Tenderness at suprapubic region - mild.  No evidence of peritonitis.  No incarcerated hernias.  Ext:  No deformity.  No mjr edema.  No cyanosis Skin: No petechiae / purpura  Results:   Labs: Results for orders placed or performed during the hospital encounter of 11/29/18 (from the past 48 hour(s))  Aerobic/Anaerobic Culture (surgical/deep wound)     Status: None (Preliminary result)   Collection Time: 11/30/18  1:16 PM  Result Value Ref Range   Specimen Description      ABSCESS Performed at [facility] [address] [place], [place] [address]    Special Requests      Normal Performed at [facility], [place] [address], [place], [place] [address]    Gram Stain      ABUNDANT WBC PRESENT, PREDOMINANTLY PMN MODERATE GRAM POSITIVE COCCI    Culture      CULTURE REINCUBATED FOR BETTER GROWTH Performed at [facility], [place] [address], [place], [place] [address]    Report Status PENDING   CBC     Status: Abnormal   Collection Time: 12/01/18  3:20 AM  Result Value Ref Range   WBC 5.0 4.0 - 10.5 K/uL   RBC 3.85 (L) 4.22 - 5.81 MIL/uL   Hemoglobin 11.8 (L) 13.0 - 17.0 g/dL   HCT 35.5 (L) 39.0 - 52.0 %   MCV 92.2 80.0 - 100.0 fL   MCH 30.6 26.0 - 34.0  pg   MCHC 33.2 30.0 - 36.0 g/dL   RDW 14.2 11.5 - 15.5 %   Platelets 213 150 - 400 K/uL   nRBC 0.0 0.0 - 0.2 %    Comment: Performed at [facility], [place] [address], [place],  [address]  Basic metabolic panel     Status: Abnormal   Collection Time: 12/01/18  3:20 AM  Result Value Ref Range   Sodium 135 135 - 145 mmol/L   Potassium 3.6 3.5 - 5.1 mmol/L   Chloride 103 98 - 111 mmol/L   CO2 24 22 - 32 mmol/L   Glucose, Bld 88 70 - 99 mg/dL   BUN 10 8 - 23 mg/dL   Creatinine, Ser 0.73 0.61 - 1.24 mg/dL   Calcium 8.0 (L) 8.9 - 10.3 mg/dL   GFR calc non Af Amer >60 >60 mL/min   GFR calc Af Amer >60 >60 mL/min   Anion gap 8 5 - 15    Comment: Performed at  [facility], [place] [address] [place], [place] [address]  CBC     Status: Abnormal   Collection Time: 12/02/18  3:47 AM  Result Value Ref Range   WBC 4.5 4.0 - 10.5 K/uL   RBC 4.00 (L) 4.22 - 5.81 MIL/uL   Hemoglobin 12.3 (L) 13.0 - 17.0 g/dL   HCT 36.2 (L) 39.0 - 52.0 %   MCV 90.5 80.0 - 100.0 fL   MCH 30.8 26.0 - 34.0 pg   MCHC 34.0 30.0 - 36.0 g/dL   RDW 13.9 11.5 - 15.5 %   Platelets 253 150 - 400 K/uL   nRBC 0.0 0.0 - 0.2 %    Comment: Performed at [facility], [place] [address] [place], [place] [address]  Basic metabolic panel     Status: Abnormal   Collection Time: 12/02/18  3:47 AM  Result Value Ref Range   Sodium 135 135 - 145 mmol/L   Potassium 3.6 3.5 - 5.1 mmol/L   Chloride 102 98 - 111 mmol/L   CO2 25 22 - 32 mmol/L   Glucose, Bld 98 70 - 99 mg/dL   BUN 6 (L) 8 - 23 mg/dL   Creatinine, Ser 0.66 0.61 - 1.24 mg/dL   Calcium 8.2 (L) 8.9 - 10.3 mg/dL   GFR calc non Af Amer >60 >60 mL/min   GFR calc Af Amer >60 >60 mL/min   Anion gap 8 5 - 15    Comment: Performed at [facility], [place] [address] [place],  [address]    Imaging / Studies: Ct Image Guided Drainage By Percutaneous Catheter  Result Date: 11/30/2018 CLINICAL  DATA:  Diverticulitis of the sigmoid colon with diverticular abscess. EXAM: CT GUIDED CATHETER DRAINAGE OF PERITONEAL ABSCESS ANESTHESIA/SEDATION: 2.0 mg IV Versed 100 mcg IV Fentanyl Total Moderate Sedation Time:  13 minutes The patient's level of consciousness and physiologic status were continuously monitored during the procedure by Radiology nursing. PROCEDURE: The procedure, risks, benefits, and alternatives were explained to the patient. Questions regarding the procedure were encouraged and answered. The patient understands and consents to the procedure. A time out was performed prior to initiating the procedure. CT was performed of the pelvis in a supine position. The left lower abdominal wall was prepped with chlorhexidine in a sterile fashion, and a sterile drape was applied covering the operative field. A sterile gown and sterile gloves were used for the procedure. Local anesthesia was provided with 1% Lidocaine. Under CT guidance, a 18 gauge trocar needle was advanced into a sigmoid diverticular abscess. Aspiration of fluid was performed. A fluid sample was sent for culture analysis. A guidewire was advanced through the needle and the needle removed. The tract was dilated and a 12 French percutaneous drain placed. Drainage catheter position was confirmed by CT. The drain was flushed and connected to a suction bulb. The drainage catheter was secured at the skin with a Prolene retention suture and StatLock device. COMPLICATIONS: None FINDINGS: Aspiration at the level of the diverticular abscess situated between the dome of the bladder and the sigmoid colon yielded purulent fluid. After placement of the drain, there is good return of thick, purulent fluid. IMPRESSION: CT-guided percutaneous catheter drainage of sigmoid diverticular abscess. A fluid sample was sent for culture analysis. A 12 French drainage catheter was placed and attached to suction bulb drainage. Electronically Signed   By: Aletta Edouard  M.D.   On: 11/30/2018 15:24    Medications / Allergies: per chart  Antibiotics: Anti-infectives (From admission, onward)   Start     Dose/Rate Route Frequency Ordered Stop   11/29/18 2100  piperacillin-tazobactam (ZOSYN) IVPB 3.375 g     3.375 g 12.5 mL/hr over 240 Minutes Intravenous Every 8 hours 11/29/18 2030          Note: Portions of this report may have been transcribed using voice recognition software. Every effort was made to ensure accuracy; however, inadvertent computerized transcription errors may be present.   Any transcriptional errors that result from this process are unintentional.     [name], [name], FACS, MASCRS Gastrointestinal and Minimally Invasive Surgery    [address] [place], [place] [address] [phone] Main / Paging [phone] Fax

## 2018-12-03 ENCOUNTER — Telehealth: Payer: Self-pay | Admitting: *Deleted

## 2018-12-03 LAB — CBC
HCT: 37.2 % — ABNORMAL LOW (ref 39.0–52.0)
Hemoglobin: 12.4 g/dL — ABNORMAL LOW (ref 13.0–17.0)
MCH: 30.5 pg (ref 26.0–34.0)
MCHC: 33.3 g/dL (ref 30.0–36.0)
MCV: 91.4 fL (ref 80.0–100.0)
Platelets: 254 10*3/uL (ref 150–400)
RBC: 4.07 MIL/uL — ABNORMAL LOW (ref 4.22–5.81)
RDW: 13.9 % (ref 11.5–15.5)
WBC: 4.5 10*3/uL (ref 4.0–10.5)
nRBC: 0 % (ref 0.0–0.2)

## 2018-12-03 LAB — BASIC METABOLIC PANEL
Anion gap: 7 (ref 5–15)
BUN: 7 mg/dL — ABNORMAL LOW (ref 8–23)
CO2: 25 mmol/L (ref 22–32)
Calcium: 8.3 mg/dL — ABNORMAL LOW (ref 8.9–10.3)
Chloride: 104 mmol/L (ref 98–111)
Creatinine, Ser: 0.73 mg/dL (ref 0.61–1.24)
GFR calc Af Amer: >60 mL/min (ref >60)
GFR calc non Af Amer: >60 mL/min (ref >60)
Glucose, Bld: 98 mg/dL (ref 70–99)
Potassium: 3.9 mmol/L (ref 3.5–5.1)
Sodium: 136 mmol/L (ref 135–145)

## 2018-12-03 MED ORDER — CIPROFLOXACIN HCL 500 MG PO TABS
500.0000 mg | ORAL_TABLET | Freq: Two times a day (BID) | ORAL | Status: DC
  Administered 2018-12-03 – 2018-12-04 (×2): 500 mg via ORAL
  Filled 2018-12-03 (×2): qty 1

## 2018-12-03 MED ORDER — METRONIDAZOLE 500 MG PO TABS
500.0000 mg | ORAL_TABLET | Freq: Three times a day (TID) | ORAL | Status: DC
  Administered 2018-12-03 – 2018-12-04 (×4): 500 mg via ORAL
  Filled 2018-12-03 (×4): qty 1

## 2018-12-03 MED ORDER — APIXABAN 5 MG PO TABS
5.0000 mg | ORAL_TABLET | Freq: Two times a day (BID) | ORAL | Status: DC
  Administered 2018-12-03 – 2018-12-04 (×3): 5 mg via ORAL
  Filled 2018-12-03 (×3): qty 1

## 2018-12-03 NOTE — Progress Notes (Signed)
PROGRESS NOTE    Adrian [name]  CNO:[id number] DOB: 02-03-1956 DOA: 11/29/2018 PCP: [name], MD     Brief Narrative:  Adrian [name] is a 63 y.o. male with medical history significant of CAD, hypothyroidism, paroxysmal atrial fibrillation, history of tonsillar cancer being sent to the hospital from his urologist for surgery.  Patient was admitted from September 23, 2020 October 04, 2018 for acute sigmoid diverticulitis with abscess.  He was treated conservatively with IV Zosyn during his hospitalization and discharged home on a 14-day course of Augmentin.   Patient reports 3-week history of dysuria, urinary frequency, and urgency.  States symptoms have been getting progressively worse.  He is having excruciating pain every time he urinates and is noticing blood in his urine.  States his primary care doctor checked his urine and recently treated him for a UTI with an antibiotic but he continues to have symptoms.  States he was seen by his urologist and they did a CT scan.  His urologist told him that he had a fistula in his bladder and he had to come into the hospital.  He has been having fevers and chills.  Also complaining of generalized abdominal pain.  Denies any nausea, vomiting, or diarrhea.  Denies any shortness of breath, cough, or chest pain.  He underwent CT-guided drain placement in the abscess on 4/25.  He remains on IV Zosyn.  New events last 24 hours / Subjective: Doing well on current dysphagia diet. Pain well controlled. Eager to go home.   Assessment & Plan:   Principal Problem:   Diverticulitis of large intestine with abscess Active Problems:   NICM (nonischemic cardiomyopathy) (HCC)   CAD (coronary artery disease), native coronary artery   Abscess of sigmoid colon due to diverticulitis   UTI (urinary tract infection)   Hyponatremia   AF (paroxysmal atrial fibrillation) (HCC)   Chronic atrial fibrillation   Current use of long term anticoagulation   Acute sigmoid  diverticulitis with abscess -CT A/P: Changes of sigmoid diverticulosis and diverticulitis. Probable intramural abscess within the mid sigmoid colon. Adjacent central pelvic fluid collection along the superior wall of the bladder measures 5.6 x 4.6 cm compatible with abscess. Mass effect on the bladder. No visible fistula. -General surgery following -Status post IR drain of the abscess on 4/25 -culture shows E Coli  -IV Zosyn --> Cipro/flagyl  -Pain control  Bacteriuria -UA showed many bacteria, greater than 50 WBC, moderate leukocytes -Urine culture negative  Paroxysmal atrial fibrillation -Resume eliquis    DVT prophylaxis: Eliquis  Code Status: Full Family Communication: None Disposition Plan: Home when cleared by general surgery    Consultants:   IR  General Surgery  Procedures:   S/p CT guided drainage diverticular abscess 4/25   Antimicrobials:  Anti-infectives (From admission, onward)   Start     Dose/Rate Route Frequency Ordered Stop   12/03/18 1600  ciprofloxacin (CIPRO) tablet 500 mg     500 mg Oral 2 times daily 12/03/18 0856     12/03/18 1400  metroNIDAZOLE (FLAGYL) tablet 500 mg     500 mg Oral Every 8 hours 12/03/18 0856 12/09/18 1359   11/29/18 2100  piperacillin-tazobactam (ZOSYN) IVPB 3.375 g  Status:  Discontinued     3.375 g 12.5 mL/hr over 240 Minutes Intravenous Every 8 hours 11/29/18 2030 12/03/18 0856       Objective: Vitals:   12/02/18 1402 12/02/18 2102 12/03/18 0513 12/03/18 0522  BP:  (!) 101/56 119/67  Pulse:  (!) 56 (!) 50 (!) 58  Resp:  18 14   Temp:  97.9 F (36.6 C) 97.9 F (36.6 C)   TempSrc:  Oral Oral   SpO2: 99% 98% 96%   Weight:      Height:        Intake/Output Summary (Last 24 hours) at 12/03/2018 1211 Last data filed at 12/03/2018 1113 Gross per 24 hour  Intake 1382.92 ml  Output 1730 ml  Net -347.08 ml   Filed Weights   11/29/18 2113  Weight: 57.7 kg    Examination: General exam: Appears calm and  comfortable  Respiratory system: Clear to auscultation. Respiratory effort normal. Cardiovascular system: S1 & S2 heard, RRR. No JVD, murmurs, rubs, gallops or clicks. No pedal edema. Gastrointestinal system: Abdomen is nondistended, soft and nontender. No organomegaly or masses felt. Normal bowel sounds heard. +drain in place  Central nervous system: Alert and oriented. No focal neurological deficits. Extremities: Symmetric 5 x 5 power. Skin: No rashes, lesions or ulcers Psychiatry: Judgement and insight appear normal. Mood & affect appropriate.    Data Reviewed: I have personally reviewed following labs and imaging studies  CBC: Recent Labs  Lab 11/29/18 1647 12/01/18 0320 12/02/18 0347 12/03/18 0429  WBC 6.0 5.0 4.5 4.5  NEUTROABS 2.4  --   --   --   HGB 13.4 11.8* 12.3* 12.4*  HCT 38.8* 35.5* 36.2* 37.2*  MCV 88.8 92.2 90.5 91.4  PLT 238 213 253 865   Basic Metabolic Panel: Recent Labs  Lab 11/29/18 1647 11/30/18 0359 12/01/18 0320 12/02/18 0347 12/03/18 0429  NA 130* 134* 135 135 136  K 3.9 3.9 3.6 3.6 3.9  CL 96* 103 103 102 104  CO2 23 23 24 25 25   GLUCOSE 111* 102* 88 98 98  BUN 10 9 10  6* 7*  CREATININE 0.68 0.73 0.73 0.66 0.73  CALCIUM 8.6* 8.2* 8.0* 8.2* 8.3*   GFR: Estimated Creatinine Clearance: 77.1 mL/min (by C-G formula based on SCr of 0.73 mg/dL). Liver Function Tests: Recent Labs  Lab 11/29/18 1647  AST 13*  ALT 13  ALKPHOS 73  BILITOT 0.6  PROT 7.5  ALBUMIN 3.0*   Recent Labs  Lab 11/29/18 1647  LIPASE 18   No results for input(s): AMMONIA in the last 168 hours. Coagulation Profile: Recent Labs  Lab 11/29/18 2109  INR 1.1   Cardiac Enzymes: No results for input(s): CKTOTAL, CKMB, CKMBINDEX, TROPONINI in the last 168 hours. BNP (last 3 results) No results for input(s): PROBNP in the last 8760 hours. HbA1C: No results for input(s): HGBA1C in the last 72 hours. CBG: No results for input(s): GLUCAP in the last 168 hours. Lipid  Profile: No results for input(s): CHOL, HDL, LDLCALC, TRIG, CHOLHDL, LDLDIRECT in the last 72 hours. Thyroid Function Tests: No results for input(s): TSH, T4TOTAL, FREET4, T3FREE, THYROIDAB in the last 72 hours. Anemia Panel: No results for input(s): VITAMINB12, FOLATE, FERRITIN, TIBC, IRON, RETICCTPCT in the last 72 hours. Sepsis Labs: Recent Labs  Lab 11/29/18 2109  LATICACIDVEN 0.9    Recent Results (from the past 240 hour(s))  Culture, Urine     Status: None   Collection Time: 11/29/18  4:47 PM  Result Value Ref Range Status   Specimen Description   Final    Urine Performed at [facility] [address] [place], [place] [address]    Special Requests   Final    NONE Performed at [facility], [address] [place], [place] [address]    Culture   Final    NO GROWTH Performed at [facility], [place] [address], [place], [place] [address]    Report Status 12/01/2018 FINAL  Final  Aerobic/Anaerobic Culture (surgical/deep wound)     Status: None (Preliminary result)   Collection Time: 11/30/18  1:16 PM  Result Value Ref Range Status   Specimen Description   Final    ABSCESS Performed at [facility] [address] [place], [place] [address]    Special Requests   Final    Normal Performed at [facility], [place] [address] [place], [place] [address]    Gram Stain   Final    ABUNDANT WBC PRESENT, PREDOMINANTLY PMN MODERATE GRAM POSITIVE COCCI    Culture   Final    FEW ESCHERICHIA COLI CULTURE REINCUBATED FOR BETTER GROWTH Performed at [facility], [place] [address] [place], [place] [address]    Report Status PENDING  Incomplete   Organism ID, Bacteria ESCHERICHIA COLI  Final      Susceptibility   Escherichia coli - MIC*    AMPICILLIN <=2 SENSITIVE Sensitive     CEFAZOLIN <=4 SENSITIVE Sensitive     CEFEPIME <=1 SENSITIVE Sensitive     CEFTAZIDIME <=1 SENSITIVE Sensitive      CEFTRIAXONE <=1 SENSITIVE Sensitive     CIPROFLOXACIN <=0.25 SENSITIVE Sensitive     GENTAMICIN <=1 SENSITIVE Sensitive     IMIPENEM <=0.25 SENSITIVE Sensitive     TRIMETH/SULFA <=20 SENSITIVE Sensitive     AMPICILLIN/SULBACTAM <=2 SENSITIVE Sensitive     PIP/TAZO <=4 SENSITIVE Sensitive     Extended ESBL NEGATIVE Sensitive     * FEW ESCHERICHIA COLI       Radiology Studies: No results found.    Scheduled Meds: . apixaban  5 mg Oral BID  . atorvastatin  40 mg Oral Daily  . ciprofloxacin  500 mg Oral BID  . feeding supplement  1 Container Oral BID BM  . feeding supplement (PRO-STAT SUGAR FREE 64)  30 mL Oral Daily  . levothyroxine  125 mcg Oral Daily  . metroNIDAZOLE  500 mg Oral Q8H  . multivitamin with minerals  1 tablet Oral Daily  . nebivolol  10 mg Oral Daily  . phenazopyridine  100 mg Oral TID WC  . sodium chloride flush  10 mL Intracatheter Q8H   Continuous Infusions: . sodium chloride Stopped (11/30/18 0504)     LOS: 4 days    Time spent: 25 minutes   [name], DO [facility] [email or web address] 12/03/2018, 12:11 PM

## 2018-12-03 NOTE — Telephone Encounter (Signed)
Pt takes Eliquis for afib with CHADS2VASc score of 2 (CHF with normalization of EF in 2017, CAD). Renal function is normal. Ok to hold Eliquis for 2 days prior to procedure.

## 2018-12-03 NOTE — Telephone Encounter (Signed)
Can you please comment on anticoagulation?

## 2018-12-03 NOTE — Telephone Encounter (Signed)
   Adrian [name] [facility] Pre-operative Risk Assessment    Request for surgical clearance:  1. What type of surgery is being performed? COLONOSCOPY AND  ROBOTIC SIGMOID COLECTOMY AND POSSBLE COLOVESICAL FISTULA REPAIR 6 -8 WEEKS  2. When is this surgery scheduled? 6- 8 WEEKS   3. What type of clearance is required (medical clearance vs. Pharmacy clearance to hold med vs. Both)? BOTH  4. Are there any medications that need to be held prior to surgery and how long? ELIQUIS  5. Practice name and name of physician performing surgery?  DR Michael Boston  What is your office phone number [phone]  7.   What is your office fax number [phone]   8.   Anesthesia type (None, local, MAC, general) ? CHOICE   Devra Dopp 12/03/2018, 1:51 PM  _________________________________________________________________   (provider comments below)

## 2018-12-03 NOTE — Progress Notes (Addendum)
CC:  abdominal pain  Subjective: Pt tolerating first solid food, no pain except at the site of the IR drain.  He has had pain with voiding but it is better with Pyridium.  He feels good and is anxious to go home.    Objective: Vital signs in last 24 hours: Temp:  [97.9 F (36.6 C)-98.5 F (36.9 C)] 97.9 F (36.6 C) (04/28 0513) Pulse Rate:  [50-58] 58 (04/28 0522) Resp:  [14-18] 14 (04/28 0513) BP: (99-119)/(52-67) 119/67 (04/28 0513) SpO2:  [96 %-100 %] 96 % (04/28 0513) Last BM Date: 12/02/18 957 PO - D1 diet400 IV 1660 urine Drain 60 Stool x 0 Afebrile, VSS Labs OK WBC 4.5 Urine culture:  No growth Specimen Description ABSCESS  Performed at [facility], [place] [address] [place], [place] [address]   Special Requests Normal  Performed at [facility], [place] [address] [place], [place] [address]   Gram Stain ABUNDANT WBC PRESENT, PREDOMINANTLY PMN  MODERATE GRAM POSITIVE COCCI   Culture FEW GRAM NEGATIVE RODS  CULTURE REINCUBATED FOR BETTER GROWTH     Intake/Output from previous day: 04/27 0701 - 04/28 0700 In: 1372.7 [P.O.:957; I.V.:220.6; IV Piggyback:175.2] Out: 1720 [Urine:1660; Drains:60] Intake/Output this shift: Total I/O In: 240 [P.O.:240] Out: -   General appearance: alert, cooperative and no distress Resp: clear to auscultation bilaterally GI: soft nontender this AM.  Drain is clear serous fluid  Lab Results:  Recent Labs    12/02/18 0347 12/03/18 0429  WBC 4.5 4.5  HGB 12.3* 12.4*  HCT 36.2* 37.2*  PLT 253 254    BMET Recent Labs    12/02/18 0347 12/03/18 0429  NA 135 136  K 3.6 3.9  CL 102 104  CO2 25 25  GLUCOSE 98 98  BUN 6* 7*  CREATININE 0.66 0.73  CALCIUM 8.2* 8.3*   PT/INR No results for input(s): LABPROT, INR in the last 72 hours.  Recent Labs  Lab 11/29/18 1647  AST 13*  ALT 13  ALKPHOS 73  BILITOT 0.6  PROT 7.5  ALBUMIN 3.0*     Lipase     Component Value Date/Time    LIPASE 18 11/29/2018 1647     Medications: . atorvastatin  40 mg Oral Daily  . feeding supplement  1 Container Oral BID BM  . feeding supplement (PRO-STAT SUGAR FREE 64)  30 mL Oral Daily  . levothyroxine  125 mcg Oral Daily  . multivitamin with minerals  1 tablet Oral Daily  . nebivolol  10 mg Oral Daily  . phenazopyridine  100 mg Oral TID WC  . sodium chloride flush  10 mL Intracatheter Q8H    Assessment/Plan UTI - culture show no growth - on pyridium pain has resolved PAF on Eliquis -currently on hold  Diverticulitis with abscess and possible colovesicular fistula IR drain placement 11/30/2018 Dr. Aletta Edouard  FEN: N.p.o./IV fluids ID: Zosyn 4/24 -4/27; 4/28:  Start Cipro/Flagyl x 6 day total 10 days Rx DVT: He can be on Lovenox from our standpoint Follow-up: Dr. Michael Boston    Plan: switch to Cipro/Flagyl.  He needs a full 10 day course of antibiotics.  He will need to have his repeat CT at the drain clinic.  Then follow up with Dr. Johney Maine. Follow up appointment is in the AVS for Dr. Johney Maine.  OK to restart Elquis. He needs to see cardiology and colonoscopy before he has surgery.       LOS: 4 days  Earnstine Regal 12/03/2018 [phone]

## 2018-12-03 NOTE — Telephone Encounter (Signed)
Left voice mail to call back 

## 2018-12-03 NOTE — Progress Notes (Signed)
Referring Physician(s): Choi,J  Supervising Physician: Jacqulynn Cadet  Patient Status:  Adrian [name] - In-pt  Chief Complaint: Abdominal pain/abscess  Subjective: Pt feeling better since abd drain placed; still has some soreness at drain site   Allergies: Patient has no known allergies.  Medications: Prior to Admission medications   Medication Sig Start Date End Date Taking? Authorizing Provider  apixaban (ELIQUIS) 5 MG TABS tablet Take 1 tablet (5 mg total) by mouth 2 (two) times daily. 02/08/18  Yes Turner, Eber Hong, MD  levothyroxine (SYNTHROID, LEVOTHROID) 125 MCG tablet Take 1 tablet (125 mcg total) by mouth daily. 12/05/17  Yes Burchette, Alinda Sierras, MD  nebivolol (BYSTOLIC) 10 MG tablet Take 1 tablet (10 mg total) by mouth daily. 05/21/18  Yes Turner, Eber Hong, MD  OVER THE COUNTER MEDICATION Take 1 tablet by mouth as needed (for cold/pain). Alka seltzer   Yes [provider]  atorvastatin (LIPITOR) 40 MG tablet Take 1 tablet (40 mg total) by mouth daily. Please keep upcoming appt in September for future refills. Thank you Patient taking differently: Take 40 mg by mouth daily.  03/01/18   Imogene Burn, PA-C     Vital Signs: BP 119/67 (BP Location: Left Arm)   Pulse (!) 58 Comment: pt asymptomatic, resting   Temp 97.9 F (36.6 C) (Oral)   Resp 14   Ht 5\' 8"  (1.727 m)   Wt 127 lb 5 oz (57.7 kg)   SpO2 96%   BMI 19.36 kg/m   Physical Exam LLQ drain intact, insertion site clean and dry, mildly tender, output 80 cc blood-tinged fluid  Imaging: Ct Abdomen Pelvis W Contrast  Result Date: 11/29/2018 CLINICAL DATA:  Recent sigmoid diverticulitis and abscess. Recurrent suprapubic pain. EXAM: CT ABDOMEN AND PELVIS WITH CONTRAST TECHNIQUE: Multidetector CT imaging of the abdomen and pelvis was performed using the standard protocol following bolus administration of intravenous contrast. CONTRAST:  159mL OMNIPAQUE IOHEXOL 300 MG/ML  SOLN COMPARISON:  Earlier today at  [facility] FINDINGS: Lower chest: 6 mm nodule in the left lower lobe. No effusions. Heart is normal size. Hepatobiliary: No focal hepatic abnormality. Gallbladder unremarkable. Pancreas: No focal abnormality or ductal dilatation. Spleen: No focal abnormality.  Normal size. Adrenals/Urinary Tract: No adrenal abnormality. No focal renal abnormality. No stones or hydronephrosis. Urinary bladder is unremarkable. Mass effect from the fluid collection along the superior bladder wall. No fistulous communication noted. Stomach/Bowel: There Is sigmoid diverticulosis. Wall thickening noted in the mid sigmoid colon. Fluid collection noted likely within the wall of the sigmoid colon measuring up to 2.3 cm. There is adjacent fluid collection along the superior bladder wall measuring 5.7 x 4.6 cm. No evidence of bowel obstruction. Vascular/Lymphatic: Aortic atherosclerosis. No enlarged abdominal or pelvic lymph nodes. Reproductive: Mildly prominent prostate. Other: No free fluid or free air. Musculoskeletal: No acute bony abnormality. IMPRESSION: Changes of sigmoid diverticulosis and diverticulitis. Probable intramural abscess within the mid sigmoid colon. Adjacent central pelvic fluid collection along the superior wall of the bladder measures 5.6 x 4.6 cm compatible with abscess. Mass effect on the bladder. No visible fistula. Electronically Signed   By: Rolm Baptise M.D.   On: 11/29/2018 20:40   Ct Image Guided Drainage By Percutaneous Catheter  Result Date: 11/30/2018 CLINICAL DATA:  Diverticulitis of the sigmoid colon with diverticular abscess. EXAM: CT GUIDED CATHETER DRAINAGE OF PERITONEAL ABSCESS ANESTHESIA/SEDATION: 2.0 mg IV Versed 100 mcg IV Fentanyl Total Moderate Sedation Time:  13 minutes The patient's level of consciousness and  physiologic status were continuously monitored during the procedure by Radiology nursing. PROCEDURE: The procedure, risks, benefits, and alternatives were explained to  the patient. Questions regarding the procedure were encouraged and answered. The patient understands and consents to the procedure. A time out was performed prior to initiating the procedure. CT was performed of the pelvis in a supine position. The left lower abdominal wall was prepped with chlorhexidine in a sterile fashion, and a sterile drape was applied covering the operative field. A sterile gown and sterile gloves were used for the procedure. Local anesthesia was provided with 1% Lidocaine. Under CT guidance, a 18 gauge trocar needle was advanced into a sigmoid diverticular abscess. Aspiration of fluid was performed. A fluid sample was sent for culture analysis. A guidewire was advanced through the needle and the needle removed. The tract was dilated and a 12 French percutaneous drain placed. Drainage catheter position was confirmed by CT. The drain was flushed and connected to a suction bulb. The drainage catheter was secured at the skin with a Prolene retention suture and StatLock device. COMPLICATIONS: None FINDINGS: Aspiration at the level of the diverticular abscess situated between the dome of the bladder and the sigmoid colon yielded purulent fluid. After placement of the drain, there is good return of thick, purulent fluid. IMPRESSION: CT-guided percutaneous catheter drainage of sigmoid diverticular abscess. A fluid sample was sent for culture analysis. A 12 French drainage catheter was placed and attached to suction bulb drainage. Electronically Signed   By: Aletta Edouard M.D.   On: 11/30/2018 15:24    Labs:  CBC: Recent Labs    11/29/18 1647 12/01/18 0320 12/02/18 0347 12/03/18 0429  WBC 6.0 5.0 4.5 4.5  HGB 13.4 11.8* 12.3* 12.4*  HCT 38.8* 35.5* 36.2* 37.2*  PLT 238 213 253 254    COAGS: Recent Labs    09/24/18 1907 11/29/18 2109  INR  --  1.1  APTT 47*  --     BMP: Recent Labs    11/30/18 0359 12/01/18 0320 12/02/18 0347 12/03/18 0429  NA 134* 135 135 136  K  3.9 3.6 3.6 3.9  CL 103 103 102 104  CO2 23 24 25 25   GLUCOSE 102* 88 98 98  BUN 9 10 6* 7*  CALCIUM 8.2* 8.0* 8.2* 8.3*  CREATININE 0.73 0.73 0.66 0.73  GFRNONAA >60 >60 >60 >60  GFRAA >60 >60 >60 >60    LIVER FUNCTION TESTS: Recent Labs    12/05/17 0931 09/23/18 0948 09/24/18 0153 11/29/18 1647  BILITOT 0.5 0.7 1.0 0.6  AST 32 18 14* 13*  ALT 23 12 10 13   ALKPHOS 68 61 51 73  PROT 7.1 7.3 5.9* 7.5  ALBUMIN 4.1 3.3* 2.5* 3.0*    Assessment and Plan: S/p diverticular abscess drainage 11/30/18; afebrile; WBC/creat nl ; hgb stable; drain fluid cx- few E coli; cont drain irrigation/output monitoring; will schedule for f/u CT/poss inj at drain clinic in 1-2 weeks; as OP flush drain once daily with 5 cc sterile NS, record output and change dressing every 1-2 days; pt given script for saline flushes; above d/w pt   Electronically Signed: D. Rowe Robert, PA-C 12/03/2018, 11:13 AM   I spent a total of 15 minutes at the the patient's bedside AND on the patient's hospital floor or unit, greater than 50% of which was counseling/coordinating care for abdominal/pelvic abscess drain    Patient ID: Adrian [name], male   DOB: 17-Feb-1956, 63 y.o.   MRN: [id number]

## 2018-12-04 ENCOUNTER — Other Ambulatory Visit: Payer: Self-pay | Admitting: Surgery

## 2018-12-04 DIAGNOSIS — I482 Chronic atrial fibrillation, unspecified: Secondary | ICD-10-CM

## 2018-12-04 DIAGNOSIS — R8271 Bacteriuria: Secondary | ICD-10-CM

## 2018-12-04 DIAGNOSIS — K5720 Diverticulitis of large intestine with perforation and abscess without bleeding: Secondary | ICD-10-CM

## 2018-12-04 LAB — CBC
HCT: 38.2 % — ABNORMAL LOW (ref 39.0–52.0)
Hemoglobin: 12.5 g/dL — ABNORMAL LOW (ref 13.0–17.0)
MCH: 30.4 pg (ref 26.0–34.0)
MCHC: 32.7 g/dL (ref 30.0–36.0)
MCV: 92.9 fL (ref 80.0–100.0)
Platelets: 278 10*3/uL (ref 150–400)
RBC: 4.11 MIL/uL — ABNORMAL LOW (ref 4.22–5.81)
RDW: 14.1 % (ref 11.5–15.5)
WBC: 5.1 10*3/uL (ref 4.0–10.5)
nRBC: 0 % (ref 0.0–0.2)

## 2018-12-04 LAB — BASIC METABOLIC PANEL
Anion gap: 7 (ref 5–15)
BUN: 16 mg/dL (ref 8–23)
CO2: 26 mmol/L (ref 22–32)
Calcium: 8.3 mg/dL — ABNORMAL LOW (ref 8.9–10.3)
Chloride: 103 mmol/L (ref 98–111)
Creatinine, Ser: 0.68 mg/dL (ref 0.61–1.24)
GFR calc Af Amer: >60 mL/min (ref >60)
GFR calc non Af Amer: >60 mL/min (ref >60)
Glucose, Bld: 102 mg/dL — ABNORMAL HIGH (ref 70–99)
Potassium: 4.1 mmol/L (ref 3.5–5.1)
Sodium: 136 mmol/L (ref 135–145)

## 2018-12-04 MED ORDER — ADULT MULTIVITAMIN W/MINERALS CH: 1.0000 | ORAL_TABLET | Freq: Every day | ORAL | Status: DC

## 2018-12-04 MED ORDER — METRONIDAZOLE 500 MG PO TABS: 500.0000 mg | ORAL_TABLET | Freq: Three times a day (TID) | ORAL | 0 refills | Status: AC

## 2018-12-04 MED ORDER — CIPROFLOXACIN HCL 500 MG PO TABS: 500.0000 mg | ORAL_TABLET | Freq: Two times a day (BID) | ORAL | 0 refills | Status: AC

## 2018-12-04 MED ORDER — PRO-STAT SUGAR FREE PO LIQD: 30.0000 mL | Freq: Every day | ORAL | 0 refills | Status: DC

## 2018-12-04 MED ORDER — ACETAMINOPHEN 325 MG PO TABS: 650.0000 mg | ORAL_TABLET | Freq: Four times a day (QID) | ORAL | Status: DC | PRN

## 2018-12-04 NOTE — Discharge Summary (Signed)
Physician Discharge Summary  ADON GEHLHAUSEN GOT:[id number] DOB: July 25, 1956 DOA: 11/29/2018  PCP: [name], MD  Admit date: 11/29/2018 Discharge date: 12/04/2018  Time spent: 50 minutes  Recommendations for Outpatient Follow-up:  1. Follow-up with Dr. Johney Maine, general surgery on 12/16/2018. 2. Follow-up with [name], MD in 2 to 3 weeks. 3. Follow-up with Rowe Robert, PA, interventional radiology and drain clinic in 1 to 2 weeks.  Office will call with appointment time.   Discharge Diagnoses:  Principal Problem:   Diverticulitis of large intestine with abscess Active Problems:   NICM (nonischemic cardiomyopathy) (HCC)   CAD (coronary artery disease), native coronary artery   Abscess of sigmoid colon due to diverticulitis   UTI (urinary tract infection)   Hyponatremia   AF (paroxysmal atrial fibrillation) (HCC)   Chronic atrial fibrillation   Current use of long term anticoagulation   Discharge Condition: Stable and improved.  Diet recommendation: Heart healthy.  Filed Weights   11/29/18 2113  Weight: 57.7 kg    History of present illness:  Per Dr Martyn Malay Adrian [name] is a 63 y.o. male with medical history significant of CAD, hypothyroidism, paroxysmal atrial fibrillation, history of tonsillar cancer being sent to the hospital from his urologist for surgery.  Patient was admitted from September 23, 2020 October 04, 2018 for acute sigmoid diverticulitis with abscess.  He was treated conservatively with antibiotics-IV Zosyn during his hospitalization and discharged home on a 14-day course of Augmentin.   Patient reports 3-week history of dysuria, urinary frequency, and urgency.  Stated symptoms have been getting progressively worse.  He was having excruciating pain every time he urinates and is noticing blood in his urine.  Stated his primary care doctor checked his urine and recently treated him for a UTI with an antibiotic but he continued to have symptoms.  Stated  he was seen by his urologist on day of admission, and they did a CT scan.  His urologist told him that he had a fistula in his bladder and he had to come into the hospital.  He has been having fevers and chills.  Also complaining of generalized abdominal pain.  Denies any nausea, vomiting, or diarrhea.  Denies any shortness of breath, cough, or chest pain.  Hospital Course:  Acute sigmoid diverticulitis with abscess -CT A/P: Changes of sigmoid diverticulosis and diverticulitis. Probable intramural abscess within the mid sigmoid colon. Adjacent central pelvic fluid collection along the superior wall of the bladder measures 5.6 x 4.6 cm compatible with abscess. Mass effect on the bladder. No visible fistula. -General surgery was consulted and followed the patient throughout the hospitalization.  Patient was also seen by IR.  -Status post IR drain of the abscess on 4/25 -culture showed E Coli  -Patient initially placed on IV Zosyn and subsequently transitioned to oral ciprofloxacin and Flagyl which he tolerated.  Patient was placed on clear liquids and diet advanced which he tolerated.  Patient improved clinically.  Patient's pain was controlled.  Patient will be discharged home on 7 more days of oral ciprofloxacin and Flagyl as per general surgery recommendations.  Patient will follow-up in the outpatient drain clinic and will need follow-up outpatient drain study done.  Patient will follow-up with general surgery in the outpatient setting.  Bacteriuria -UA showed many bacteria, greater than 50 WBC, moderate leukocytes -Urine culture negative  Paroxysmal atrial fibrillation -Remained rate controlled during the hospitalization.  Once drain was placed and patient remained stable Eliquis was resumed.  Patient had  no bleeding.  Outpatient follow-up.    Procedures:  S/p CT guided drainage diverticular abscess 4/25    Consultations:  IR  General Surgery  Discharge Exam: Vitals:    12/04/18 0520 12/04/18 0526  BP: 130/83   Pulse: (!) 50 (!) 54  Resp: 16   Temp: (!) 97.5 F (36.4 C)   SpO2: 98%     General: NAD Cardiovascular: RRR Respiratory: CTAB  Discharge Instructions   Discharge Instructions    Diet - low sodium heart healthy   Complete by:  As directed    Increase activity slowly   Complete by:  As directed      Allergies as of 12/04/2018   No Known Allergies     Medication List    TAKE these medications   acetaminophen 325 MG tablet Commonly known as:  TYLENOL Take 2 tablets (650 mg total) by mouth every 6 (six) hours as needed for mild pain (or Fever >/= 101).   apixaban 5 MG Tabs tablet Commonly known as:  ELIQUIS Take 1 tablet (5 mg total) by mouth 2 (two) times daily.   atorvastatin 40 MG tablet Commonly known as:  LIPITOR Take 1 tablet (40 mg total) by mouth daily. Please keep upcoming appt in September for future refills. Thank you What changed:  additional instructions   ciprofloxacin 500 MG tablet Commonly known as:  CIPRO Take 1 tablet (500 mg total) by mouth 2 (two) times daily for 7 days.   feeding supplement (PRO-STAT SUGAR FREE 64) Liqd Take 30 mLs by mouth daily.   levothyroxine 125 MCG tablet Commonly known as:  SYNTHROID Take 1 tablet (125 mcg total) by mouth daily.   metroNIDAZOLE 500 MG tablet Commonly known as:  FLAGYL Take 1 tablet (500 mg total) by mouth every 8 (eight) hours for 7 days.   multivitamin with minerals Tabs tablet Take 1 tablet by mouth daily. Start taking on:  December 05, 2018   nebivolol 10 MG tablet Commonly known as:  Bystolic Take 1 tablet (10 mg total) by mouth daily.   OVER THE COUNTER MEDICATION Take 1 tablet by mouth as needed (for cold/pain). Alka seltzer      No Known Allergies Follow-up Information    Michael Boston, MD Follow up on 12/16/2018.   Specialty:  General Surgery Why:  Your appointment is at 1:30PM.  Be at the office 30 minutes early for check in.  Bring photo  ID and insurance information.   Contact information: [address] [place] [address] [phone]        [name], MD. Schedule an appointment as soon as possible for a visit.   Specialty:  Family Medicine Why:  f/u in 2-3 weeks. Contact information: [place] [address] [phone]        Nicki Reaper, PA-C Follow up.   Specialty:  Radiology Why:  f/u in drain clinic in 1-2 weeks. office will call with appointment time. Contact information: [place] [address] [phone]            The results of significant diagnostics from this hospitalization (including imaging, microbiology, ancillary and laboratory) are listed below for reference.    Significant Diagnostic Studies: Ct Abdomen Pelvis W Contrast  Result Date: 11/29/2018 CLINICAL DATA:  Recent sigmoid diverticulitis and abscess. Recurrent suprapubic pain. EXAM: CT ABDOMEN AND PELVIS WITH CONTRAST TECHNIQUE: Multidetector CT imaging of the abdomen and pelvis was performed using the  standard protocol following bolus administration of intravenous contrast. CONTRAST:  12mL OMNIPAQUE IOHEXOL 300 MG/ML  SOLN COMPARISON:  Earlier today at [facility] FINDINGS: Lower chest: 6 mm nodule in the left lower lobe. No effusions. Heart is normal size. Hepatobiliary: No focal hepatic abnormality. Gallbladder unremarkable. Pancreas: No focal abnormality or ductal dilatation. Spleen: No focal abnormality.  Normal size. Adrenals/Urinary Tract: No adrenal abnormality. No focal renal abnormality. No stones or hydronephrosis. Urinary bladder is unremarkable. Mass effect from the fluid collection along the superior bladder wall. No fistulous communication noted. Stomach/Bowel: There Is sigmoid diverticulosis. Wall thickening noted in the mid sigmoid colon. Fluid collection noted likely within the wall of the sigmoid colon measuring up to  2.3 cm. There is adjacent fluid collection along the superior bladder wall measuring 5.7 x 4.6 cm. No evidence of bowel obstruction. Vascular/Lymphatic: Aortic atherosclerosis. No enlarged abdominal or pelvic lymph nodes. Reproductive: Mildly prominent prostate. Other: No free fluid or free air. Musculoskeletal: No acute bony abnormality. IMPRESSION: Changes of sigmoid diverticulosis and diverticulitis. Probable intramural abscess within the mid sigmoid colon. Adjacent central pelvic fluid collection along the superior wall of the bladder measures 5.6 x 4.6 cm compatible with abscess. Mass effect on the bladder. No visible fistula. Electronically Signed   By: Rolm Baptise M.D.   On: 11/29/2018 20:40   Ct Image Guided Drainage By Percutaneous Catheter  Result Date: 11/30/2018 CLINICAL DATA:  Diverticulitis of the sigmoid colon with diverticular abscess. EXAM: CT GUIDED CATHETER DRAINAGE OF PERITONEAL ABSCESS ANESTHESIA/SEDATION: 2.0 mg IV Versed 100 mcg IV Fentanyl Total Moderate Sedation Time:  13 minutes The patient's level of consciousness and physiologic status were continuously monitored during the procedure by Radiology nursing. PROCEDURE: The procedure, risks, benefits, and alternatives were explained to the patient. Questions regarding the procedure were encouraged and answered. The patient understands and consents to the procedure. A time out was performed prior to initiating the procedure. CT was performed of the pelvis in a supine position. The left lower abdominal wall was prepped with chlorhexidine in a sterile fashion, and a sterile drape was applied covering the operative field. A sterile gown and sterile gloves were used for the procedure. Local anesthesia was provided with 1% Lidocaine. Under CT guidance, a 18 gauge trocar needle was advanced into a sigmoid diverticular abscess. Aspiration of fluid was performed. A fluid sample was sent for culture analysis. A guidewire was advanced through the  needle and the needle removed. The tract was dilated and a 12 French percutaneous drain placed. Drainage catheter position was confirmed by CT. The drain was flushed and connected to a suction bulb. The drainage catheter was secured at the skin with a Prolene retention suture and StatLock device. COMPLICATIONS: None FINDINGS: Aspiration at the level of the diverticular abscess situated between the dome of the bladder and the sigmoid colon yielded purulent fluid. After placement of the drain, there is good return of thick, purulent fluid. IMPRESSION: CT-guided percutaneous catheter drainage of sigmoid diverticular abscess. A fluid sample was sent for culture analysis. A 12 French drainage catheter was placed and attached to suction bulb drainage. Electronically Signed   By: Aletta Edouard M.D.   On: 11/30/2018 15:24    Microbiology: Recent Results (from the past 240 hour(s))  Culture, Urine     Status: None   Collection Time: 11/29/18  4:47 PM  Result Value Ref Range Status   Specimen Description   Final    Urine Performed at [facility], [address] [place], [place] [address]    Special Requests   Final    NONE Performed at [facility], [place] [address], [place], [place] [address]    Culture   Final    NO GROWTH Performed at [facility], [place] [address] [place], [place] [address]    Report Status 12/01/2018 FINAL  Final  Aerobic/Anaerobic Culture (surgical/deep wound)     Status: None (Preliminary result)   Collection Time: 11/30/18  1:16 PM  Result Value Ref Range Status   Specimen Description   Final    ABSCESS Performed at [facility] [address] [place], [place] [address]    Special Requests   Final    Normal Performed at [facility], [place] [address], [place], [place] [address]    Gram Stain   Final    ABUNDANT WBC PRESENT, PREDOMINANTLY PMN MODERATE GRAM POSITIVE COCCI    Culture    Final    FEW ESCHERICHIA COLI MODERATE BACTEROIDES OVATUS BETA LACTAMASE POSITIVE Performed at [facility], [place] [address], [place], [place] [address]    Report Status PENDING  Incomplete   Organism ID, Bacteria ESCHERICHIA COLI  Final      Susceptibility   Escherichia coli - MIC*    AMPICILLIN <=2 SENSITIVE Sensitive     CEFAZOLIN <=4 SENSITIVE Sensitive     CEFEPIME <=1 SENSITIVE Sensitive     CEFTAZIDIME <=1 SENSITIVE Sensitive     CEFTRIAXONE <=1 SENSITIVE Sensitive     CIPROFLOXACIN <=0.25 SENSITIVE Sensitive     GENTAMICIN <=1 SENSITIVE Sensitive     IMIPENEM <=0.25 SENSITIVE Sensitive     TRIMETH/SULFA <=20 SENSITIVE Sensitive     AMPICILLIN/SULBACTAM <=2 SENSITIVE Sensitive     PIP/TAZO <=4 SENSITIVE Sensitive     Extended ESBL NEGATIVE Sensitive     * FEW ESCHERICHIA COLI     Labs: Basic Metabolic Panel: Recent Labs  Lab 11/30/18 0359 12/01/18 0320 12/02/18 0347 12/03/18 0429 12/04/18 0323  NA 134* 135 135 136 136  K 3.9 3.6 3.6 3.9 4.1  CL 103 103 102 104 103  CO2 23 24 25 25 26   GLUCOSE 102* 88 98 98 102*  BUN 9 10 6* 7* 16  CREATININE 0.73 0.73 0.66 0.73 0.68  CALCIUM 8.2* 8.0* 8.2* 8.3* 8.3*   Liver Function Tests: Recent Labs  Lab 11/29/18 1647  AST 13*  ALT 13  ALKPHOS 73  BILITOT 0.6  PROT 7.5  ALBUMIN 3.0*   Recent Labs  Lab 11/29/18 1647  LIPASE 18   No results for input(s): AMMONIA in the last 168 hours. CBC: Recent Labs  Lab 11/29/18 1647 12/01/18 0320 12/02/18 0347 12/03/18 0429 12/04/18 0323  WBC 6.0 5.0 4.5 4.5 5.1  NEUTROABS 2.4  --   --   --   --   HGB 13.4 11.8* 12.3* 12.4* 12.5*  HCT 38.8* 35.5* 36.2* 37.2* 38.2*  MCV 88.8 92.2 90.5 91.4 92.9  PLT 238 213 253 254 278   Cardiac Enzymes: No results for input(s): CKTOTAL, CKMB, CKMBINDEX, TROPONINI in the last 168 hours. BNP: BNP (last 3 results) No results for input(s): BNP in the last 8760 hours.  ProBNP (last 3 results) No results for input(s): PROBNP  in the last 8760 hours.  CBG: No results for input(s): GLUCAP in the last 168 hours.     Signed:  Irine Seal MD.  [facility] 12/04/2018, 12:38 PM

## 2018-12-04 NOTE — Progress Notes (Signed)
Adrian [name] [id number] 09/29/1955  CARE TEAM:  PCP: [name], MD  Outpatient Care Team: Patient Care Team: [name], MD as PCP - General (Family Medicine) Sueanne Margarita, MD as Consulting Physician (Cardiology)  Inpatient Treatment Team: Treatment Team: Attending Provider: Eugenie Filler, MD; Consulting Physician: Edison Pace, Md, MD; Registered Nurse: [name], RN; Technician: [name], NT; Rounding Team: [name], MD; Rounding Team: [name] Radiology, MD; Technician: [place], [place], [place]; Registered Nurse: Johna Sheriff, RN; Registered Nurse: Jennye Boroughs, RN; Case Manager: [name], RN; Utilization Review: Delrae Sawyers, RN   Problem List:   Principal Problem:   Diverticulitis of large intestine with abscess Active Problems:   NICM (nonischemic cardiomyopathy) ([place])   CAD (coronary artery disease), native coronary artery   Abscess of sigmoid colon due to diverticulitis   UTI (urinary tract infection)   Hyponatremia   AF (paroxysmal atrial fibrillation) ([place])   Chronic atrial fibrillation   Current use of long term anticoagulation      * No surgery found *      Assessment  Diverticular abscess improved with percutaneous drainage & IV ABx  [facility] Stay = 5 days)  Plan:  -Okay for discharge today from surgery standpoint.  -Cipro/Flagyl x7 more days.  Culture shows pansensitive E. coli   -Heart healthy diet until surgery   -Drain care.  Go home with the drain with follow-up outpatient drain study.  High risk for developing colovesical fistula although no evidence yet clinically despite the massive abscess severely pushing on his bladder.  Clinically better.  Given this complex attack that required readmission, plan sigmoid colectomy to remove the problem area.  Hopefully with drainage and antibiotics, we can convert this to a 1 stage minimally invasive procedure.  Robotic sigmoid  colectomy and possible colovesical fistula repair in 6 weeks.    The anatomy & physiology of the digestive tract was discussed.  The pathophysiology of  fistula between the bowel and bladder was discussed.  Natural history risks without surgery was discussed. I worked to give an overview of the disease and the frequent need to have multispecialty involvement.   I feel the risks of no intervention will lead to serious problems that outweigh the operative risks; therefore, I recommended surgery to treat the pathology.  Laparoscopic & open techniques for partial proctocolectomy with bladder repair were discussed.  Possible fecal diversion by ostomy was discussed.  We will work to preserve anal & pelvic floor function without sacrificing cure.  Need for prolonged bladder catheterization was discussed.  Risks such as bleeding, infection, abscess, leak, injury to other organs, need for repair of tissues / organs, recurrence with reoperation, possible ostomy, hernia, heart attack, death, and other risks were discussed.  I noted a good likelihood this will help address the problem.   Goals of post-operative recovery were discussed as well.  We will work to minimize complications.  An educational handout on the pathology was given as well.  Questions were answered.    The patient expresses understanding & wishes to proceed with surgery.  Cardiomyopathy and atrial fibrillation on chronic anticoagulation.  Would require cardiac clearance.  I believe Dr. Fransico Him is his usual cardiologist.  Seems like a note said it was okay to hold Eliquis for 2 days.    He is overdue for colonoscopy.  He cannot remember who did it or where.  Does not recall any polyps or other abnormalities over 10  years ago  Iwould want to make sure that is done preoperatively to rule out evidence of cancer causing the abscess and perforation.  Make sure there is no other endoluminal problems in his colon.  Reasonable to try and coordinate so  that he gets colonoscopy the day before surgery, therefore only having to deal with one bowel prep.  -VTE prophylaxis- SCDs, etc -mobilize as tolerated to help recovery    30 minutes spent in review, evaluation, examination, counseling, and coordination of care.  More than 50% of that time was spent in counseling.  12/04/2018    Subjective: (Chief complaint) Feeling better.  Tolerating solids.  Wants to go home.  Objective:  Vital signs:  Vitals:   12/03/18 2147 12/03/18 2200 12/04/18 0520 12/04/18 0526  BP: 127/73  130/83   Pulse: (!) 52 (!) 56 (!) 50 (!) 54  Resp: 20  16   Temp: 98.4 F (36.9 C)  (!) 97.5 F (36.4 C)   TempSrc: Oral  Oral   SpO2: 97%  98%   Weight:      Height:        Last BM Date: 12/03/18  Intake/Output   Yesterday:  04/28 0701 - 04/29 0700 In: 1390.2 [P.O.:1200; I.V.:120; IV Piggyback:50.2] Out: 1445 [Urine:1325; Drains:120] This shift:  Total I/O In: 120 [P.O.:120] Out: 150 [Urine:150]  Bowel function:  Flatus: YES  BM:  YES  Drain: Thin serous / light brown output -less bloody and less thick   Physical Exam:  General: Pt awake/alert/oriented x4 in no acute distress.  Thin but not cachectic Eyes: PERRL, normal EOM.  Sclera clear.  No icterus Neuro: CN II-XII intact w/o focal sensory/motor deficits. Lymph: No head/neck/groin lymphadenopathy Psych:  No delerium/psychosis/paranoia HENT: Normocephalic, Mucus membranes moist.  No thrush Neck: Supple, No tracheal deviation Chest: No chest wall pain w good excursion CV:  Pulses intact.  Regular rhythm MS: Normal AROM mjr joints.  No obvious deformity  Abdomen: Soft.  Nondistended.  Tenderness at [facility] site only.  No evidence of peritonitis.  No incarcerated hernias.  Ext:  No deformity.  No mjr edema.  No cyanosis Skin: No petechiae / purpura  Results:   Labs: Results for orders placed or performed during the hospital encounter of 11/29/18 (from the past 48 hour(s))  CBC      Status: Abnormal   Collection Time: 12/03/18  4:29 AM  Result Value Ref Range   WBC 4.5 4.0 - 10.5 K/uL   RBC 4.07 (L) 4.22 - 5.81 MIL/uL   Hemoglobin 12.4 (L) 13.0 - 17.0 g/dL   HCT 37.2 (L) 39.0 - 52.0 %   MCV 91.4 80.0 - 100.0 fL   MCH 30.5 26.0 - 34.0 pg   MCHC 33.3 30.0 - 36.0 g/dL   RDW 13.9 11.5 - 15.5 %   Platelets 254 150 - 400 K/uL   nRBC 0.0 0.0 - 0.2 %    Comment: Performed at [facility], [place] [address] [place], [place] [address]  Basic metabolic panel     Status: Abnormal   Collection Time: 12/03/18  4:29 AM  Result Value Ref Range   Sodium 136 135 - 145 mmol/L   Potassium 3.9 3.5 - 5.1 mmol/L   Chloride 104 98 - 111 mmol/L   CO2 25 22 - 32 mmol/L   Glucose, Bld 98 70 - 99 mg/dL   BUN 7 (L) 8 - 23 mg/dL   Creatinine, Ser 0.73 0.61 - 1.24 mg/dL   Calcium  8.3 (L) 8.9 - 10.3 mg/dL   GFR calc non Af Amer >60 >60 mL/min   GFR calc Af Amer >60 >60 mL/min   Anion gap 7 5 - 15    Comment: Performed at [facility], [place] [address], [place], [place] [address]  Basic metabolic panel     Status: Abnormal   Collection Time: 12/04/18  3:23 AM  Result Value Ref Range   Sodium 136 135 - 145 mmol/L   Potassium 4.1 3.5 - 5.1 mmol/L   Chloride 103 98 - 111 mmol/L   CO2 26 22 - 32 mmol/L   Glucose, Bld 102 (H) 70 - 99 mg/dL   BUN 16 8 - 23 mg/dL   Creatinine, Ser 0.68 0.61 - 1.24 mg/dL   Calcium 8.3 (L) 8.9 - 10.3 mg/dL   GFR calc non Af Amer >60 >60 mL/min   GFR calc Af Amer >60 >60 mL/min   Anion gap 7 5 - 15    Comment: Performed at [facility], [place] [address], [place], [place] [address]  CBC     Status: Abnormal   Collection Time: 12/04/18  3:23 AM  Result Value Ref Range   WBC 5.1 4.0 - 10.5 K/uL   RBC 4.11 (L) 4.22 - 5.81 MIL/uL   Hemoglobin 12.5 (L) 13.0 - 17.0 g/dL   HCT 38.2 (L) 39.0 - 52.0 %   MCV 92.9 80.0 - 100.0 fL   MCH 30.4 26.0 - 34.0 pg   MCHC 32.7 30.0 - 36.0 g/dL   RDW 14.1 11.5 - 15.5 %    Platelets 278 150 - 400 K/uL   nRBC 0.0 0.0 - 0.2 %    Comment: Performed at [facility], [place] [address], [place], [place] [address]    Imaging / Studies: No results found.  Medications / Allergies: per chart  Antibiotics: Anti-infectives (From admission, onward)   Start     Dose/Rate Route Frequency Ordered Stop   12/03/18 1600  ciprofloxacin (CIPRO) tablet 500 mg     500 mg Oral 2 times daily 12/03/18 0856     12/03/18 1400  metroNIDAZOLE (FLAGYL) tablet 500 mg     500 mg Oral Every 8 hours 12/03/18 0856 12/09/18 1359   11/29/18 2100  piperacillin-tazobactam (ZOSYN) IVPB 3.375 g  Status:  Discontinued     3.375 g 12.5 mL/hr over 240 Minutes Intravenous Every 8 hours 11/29/18 2030 12/03/18 0856        Note: Portions of this report may have been transcribed using voice recognition software. Every effort was made to ensure accuracy; however, inadvertent computerized transcription errors may be present.   Any transcriptional errors that result from this process are unintentional.     [name], MD, FACS, MASCRS Gastrointestinal and Minimally Invasive Surgery    [address] [place], [place] [address] [phone] Main / Paging [phone] Fax

## 2018-12-04 NOTE — TOC Transition Note (Signed)
Transition of Care [facility]) - CM/SW Discharge Note   Patient Details  Name: Adrian [name] MRN: [id number] Date of Birth: 1956-06-16  Transition of [facility]) CM/SW Contact:  Leeroy Cha, RN Phone Number: 12/04/2018, 1:38 PM   Clinical Narrative:    Discharged to home with self-care, orders checked for hhc needs. No TOC needs present at time of discharge.  Patient is able to arrangement own appointments and home care.   Final next level of care: Home/Self Care Barriers to Discharge: No Barriers Identified   Patient Goals and CMS Choice Patient states their goals for this hospitalization and ongoing recovery are:: to go home CMS Medicare.gov Compare Post Acute Care list provided to:: Patient    Discharge Placement                       Discharge Plan and Services   Discharge Planning Services: CM Consult                                 Social Determinants of Health (SDOH) Interventions     Readmission Risk Interventions No flowsheet data found.

## 2018-12-04 NOTE — Discharge Instructions (Signed)
Follow-up with interventional radiology department drain clinic for study to see if abscess has resolved and you have not developed a abnormal fistulous connection between your colon and bladder.  Complete Cipro/Flagyl antibiotics x7 more days  Follow-up with [facility] to discuss elective sigmoid colon resection to remove area of chronic diverticulitis that has caused the abscesses.  Usually plan 6 weeks after discharge = mid/late June  You will need a colonoscopy prior to surgery to rule out a cancer or other problems.  [facility] should help to get a gastroenterologist to coordinate that.  Diverticulitis  Diverticulitis is when small pockets in your large intestine (colon) get infected or swollen. This causes stomach pain and watery poop (diarrhea). These pouches are called diverticula. They form in people who have a condition called diverticulosis. Follow these instructions at home: Medicines  Take over-the-counter and prescription medicines only as told by your doctor. These include: ? Antibiotics. ? Pain medicines. ? Fiber pills. ? Probiotics. ? Stool softeners.  Do not drive or use heavy machinery while taking prescription pain medicine.  If you were prescribed an antibiotic, take it as told. Do not stop taking it even if you feel better. General instructions   Follow a diet as told by your doctor.  When you feel better, your doctor may tell you to change your diet. You may need to eat a lot of fiber. Fiber makes it easier to poop (have bowel movements). Healthy foods with fiber include: ? Berries. ? Beans. ? Lentils. ? Green vegetables.  Exercise 3 or more times a week. Aim for 30 minutes each time. Exercise enough to sweat and make your heart beat faster.  Keep all follow-up visits as told. This is important. You may need to have an exam of the large intestine. This is called a colonoscopy. Contact a doctor if:  Your pain does not  get better.  You have a hard time eating or drinking.  You are not pooping like normal. Get help right away if:  Your pain gets worse.  Your problems do not get better.  Your problems get worse very fast.  You have a fever.  You throw up (vomit) more than one time.  You have poop that is: ? Bloody. ? Black. ? Tarry. Summary  Diverticulitis is when small pockets in your large intestine (colon) get infected or swollen.  Take medicines only as told by your doctor.  Follow a diet as told by your doctor. This information is not intended to replace advice given to you by your health care provider. Make sure you discuss any questions you have with your health care provider. Document Released: 01/10/2008 Document Revised: 08/10/2016 Document Reviewed: 08/10/2016 Elsevier Interactive Patient Education  2019 [place] for at least the next week A soft-food eating plan includes foods that are safe and easy to chew and swallow. Your health care provider or dietitian can help you find foods and flavors that fit into this plan. Follow this plan until your health care provider or dietitian says it is safe to start eating other foods and food textures. What are tips for following this plan? General guidelines   Take small bites of food, or cut food into pieces about  inch or smaller. Bite-sized pieces of food are easier to chew and swallow.  Eat moist foods. Avoid overly dry foods.  Avoid foods that: ? Are difficult to swallow, such as dry, chunky, crispy, or sticky  foods. ? Are difficult to chew, such as hard, tough, or stringy foods. ? Contain nuts, seeds, or fruits.  Follow instructions from your dietitian about the types of liquids that are safe for you to swallow. You may be allowed to have: ? Thick liquids only. This includes only liquids that are thicker than honey. ? Thin and thick liquids. This includes all beverages and foods that become  liquid at room temperature.  To make thick liquids: ? Purchase a commercial liquid thickening powder. These are available at grocery stores and pharmacies. ? Mix the thickener into liquids according to instructions on the label. ? Purchase ready-made thickened liquids. ? Thicken soup by pureeing, straining to remove chunks, and adding flour, potato flakes, or corn starch. ? Add commercial thickener to foods that become liquid at room temperature, such as milk shakes, yogurt, ice cream, gelatin, and sherbet.  Ask your health care provider whether you need to take a fiber supplement. Cooking  Cook meats so they stay tender and moist. Use methods like braising, stewing, or baking in liquid.  Cook vegetables and fruit until they are soft enough to be mashed with a fork.  Peel soft, fresh fruits such as peaches, nectarines, and melons.  When making soup, make sure chunks of meat and vegetables are smaller than  inch.  Reheat leftover foods slowly so that a tough crust does not form. What foods are allowed? The items listed below may not be a complete list. Talk with your dietitian about what dietary choices are best for you. Grains Breads, muffins, pancakes, or waffles moistened with syrup, jelly, or butter. Dry cereals well-moistened with milk. Moist, cooked cereals. Well-cooked pasta and rice. Vegetables All soft-cooked vegetables. Shredded lettuce. Fruits All canned and cooked fruits. Soft, peeled fresh fruits. Strawberries. Dairy Milk. Cream. Yogurt. Cottage cheese. Soft cheese without the rind. Meats and other protein foods Tender, moist ground meat, poultry, or fish. Meat cooked in gravy or sauces. Eggs. Sweets and desserts Ice cream. Milk shakes. Sherbet. Pudding. Fats and oils Butter. Margarine. Olive, canola, sunflower, and grapeseed oil. Smooth salad dressing. Smooth cream cheese. Mayonnaise. Gravy. What foods are not allowed? The items listed bemay not be a complete list.  Talk with your dietitian about what dietary choices are best for you. Grains Coarse or dry cereals, such as bran, granola, and shredded wheat. Tough or chewy crusty breads, such as [place] bread or baguettes. Breads with nuts, seeds, or fruit. Vegetables All raw vegetables. Cooked corn. Cooked vegetables that are tough or stringy. Tough, crisp, fried potatoes and potato skins. Fruits Fresh fruits with skins or seeds, or both, such as apples, pears, and grapes. Stringy, high-pulp fruits, such as papaya, pineapple, coconut, and mango. Fruit leather and all dried fruit. Dairy Yogurt with nuts or coconut. Meats and other protein foods Hard, dry sausages. Dry meat, poultry, or fish. Meats with gristle. Fish with bones. Fried meat or fish. Lunch meat and hotdogs. Nuts and seeds. Chunky peanut butter or other nut butters. Sweets and desserts Cakes or cookies that are very dry or chewy. Desserts with dried fruit, nuts, or coconut. Fried pastries. Very rich pastries. Fats and oils Cream cheese with fruit or nuts. Salad dressings with seeds or chunks. Summary  A soft-food eating plan includes foods that are safe and easy to swallow. Generally, the foods should be soft enough to be mashed with a fork.  Avoid foods that are dry, hard to chew, crunchy, sticky, stringy, or crispy.  Ask your health care  provider whether you need to thicken your liquids and if you need to take a fiber supplement. This information is not intended to replace advice given to you by your health care provider. Make sure you discuss any questions you have with your health care provider. Document Released: 10/31/2007 Document Revised: 09/26/2016 Document Reviewed: 09/26/2016 Elsevier Interactive Patient Education  2019 [employer].

## 2018-12-04 NOTE — Progress Notes (Signed)
Discharge instructions given to pt and all questions were answered.  

## 2018-12-05 DIAGNOSIS — K5720 Diverticulitis of large intestine with perforation and abscess without bleeding: Secondary | ICD-10-CM

## 2018-12-05 DIAGNOSIS — R8271 Bacteriuria: Secondary | ICD-10-CM

## 2018-12-05 LAB — AEROBIC/ANAEROBIC CULTURE W GRAM STAIN (SURGICAL/DEEP WOUND)

## 2018-12-05 LAB — AEROBIC/ANAEROBIC CULTURE (SURGICAL/DEEP WOUND): Special Requests: NORMAL

## 2018-12-05 MED FILL — metroNIDAZOLE 500 MG TABS: 500 | 7 days supply | Qty: 21 | Fill #0

## 2018-12-05 MED FILL — CIPROFLOXACIN HCL 500 MG TA: 500 | 7 days supply | Qty: 14 | Fill #0

## 2018-12-09 NOTE — Telephone Encounter (Signed)
   Primary Cardiologist: Fransico Him, MD  Chart reviewed as part of pre-operative protocol coverage. Patient was contacted 12/09/2018 in reference to pre-operative risk assessment for pending surgery as outlined below.  Adrian [name] was last seen on 05/06/18 by Dr. [name].  Since that day, Adrian [name] has done well. Heart cath in 2017 with nonobstructive disease and echo in July 2017 with normalized EF. His atrial fibrillation ihas been controlled. He denies any changes to his cardiac history and no new anginal complaints. He is able to do activity, but is limited by fatigue in the setting of abdominal infection and drain. He is not limited by anginal symptoms.   Per our pharmacy staff:  Pt takes Eliquis for afib with CHADS2VASc score of 2 (CHF with normalization of EF in 2017, CAD). Renal function is normal. Ok to hold Eliquis for 2 days prior to procedure.  Therefore, based on ACC/AHA guidelines, the patient would be at acceptable risk for the planned procedure without further cardiovascular testing.  These recommendations are good for two months.  I will route this recommendation to the requesting party via Epic fax function and remove from pre-op pool.  Please call with questions.  Tami Lin Asheton Viramontes, PA 12/09/2018, 8:15 AM

## 2018-12-16 ENCOUNTER — Telehealth: Payer: Self-pay

## 2018-12-16 ENCOUNTER — Other Ambulatory Visit: Payer: Self-pay | Admitting: Family Medicine

## 2018-12-16 NOTE — Telephone Encounter (Signed)
Called patient to schedule an ER/Hospital follow up and patient is going to wait until he gets his tube taken out on 12/18/18 and see what they tell him and then he will call to schedule a follow up. Patient states that he will know more after this visit and call to schedule a follow up and if he needs more labs he will call to let us know so we can order TSH and other labs for his yearly update. Patient verbalized an understanding.

## 2018-12-18 ENCOUNTER — Other Ambulatory Visit: Payer: BLUE CROSS/BLUE SHIELD

## 2018-12-18 ENCOUNTER — Ambulatory Visit
Admission: RE | Admit: 2018-12-18 | Discharge: 2018-12-18 | Disposition: A | Discharge status: Home / Self Care | Payer: BLUE CROSS/BLUE SHIELD | Source: Ambulatory Visit | Attending: Radiology | Admitting: Radiology

## 2018-12-18 ENCOUNTER — Ambulatory Visit
Admission: RE | Admit: 2018-12-18 | Discharge: 2018-12-18 | Disposition: A | Discharge status: Home / Self Care | Payer: BLUE CROSS/BLUE SHIELD | Source: Ambulatory Visit | Attending: Surgery | Admitting: Surgery

## 2018-12-18 ENCOUNTER — Encounter: Payer: Self-pay | Admitting: Radiology

## 2018-12-18 DIAGNOSIS — K5720 Diverticulitis of large intestine with perforation and abscess without bleeding: Secondary | ICD-10-CM

## 2018-12-18 HISTORY — PX: IR RADIOLOGIST EVAL & MGMT: IMG5224

## 2018-12-18 MED ORDER — IOPAMIDOL (ISOVUE-300) INJECTION 61%
100.0000 mL | Freq: Once | INTRAVENOUS | Status: AC | PRN
  Administered 2018-12-18: 10:00:00 100 mL via INTRAVENOUS

## 2018-12-18 NOTE — Progress Notes (Signed)
Referring Physician(s): Dr. Johney Maine  Chief Complaint: The patient is seen in follow up today s/p diverticular abscess with drain placement 11/30/18  History of present illness: Adrian [name] is a 63 y.o. male with a past medical history significant for hypothyroidism, CAD, paroxysmal a.fib currently on Eliquis, history of sigmoid diverticulitis with abscess treated with IV abx (09/1718 - 09/26/18) and history of tonsillar cancer s/p chemotherapy and radiation who presented to [facility] ED on 11/29/18 with complaints of urinary retention, dysuria, hematuria, chills pelvic pain and abdominal pain. A CT abdomen and pelvis with contrast showed a probable intramural abscess within the mid sigmoid colon as well as adjacent central pelvic fluid collection along the superior wall of the bladder measuring 5.6 x 4.6 cm compatible with abscess and mass effect of the bladder. Patient underwent pelvic drain placement 11/30/18.   He presents to IR clinic today for repeat imaging and drain injection. He has been feeling well at home.  He has been able to eat and drink with tolerance.  Denies fever, chills, abdominal pain, nausea, vomiting.  No abdominal pressure or difficulty voiding. He was flushing regularly until a few days ago when he contacted our office with questions about drain and was told he could stop flushing.  Output has been minimal.   Past Medical History:  Diagnosis Date  . CAD (coronary artery disease), native coronary artery 12/06/2015   a. 10/2015 MV: EF  37%, reversible defect inferior apex, intermediate risk findings; b. 10/2015 Cath: 20% mid RCA.  Marland Kitchen History of chemotherapy 2005   Cisplatin  . Hypothyroidism   . NICM (nonischemic cardiomyopathy) ([place]) 10/14/2015   a. Tachy mediated?;  b. Echo 3/17 - Mild concentric LVH, EF 30-35%, anteroseptal, anterior, anterolateral, apical anterior, lateral hypokinesis, trivial MR, mild to moderately reduced RVSF; c. LHC 3/17 - mRCA 20%  . Paroxysmal atrial flutter  ([place])    a. TEE 3/17 with ? LAA clot-->s/p TEE/DCCV 11/24/2015  . Radiation NOv.3,2005-Dec. 15, 2005   6810 cGy in 30 fractions  . Tonsil cancer ([place]) 2005    Past Surgical History:  Procedure Laterality Date  . CARDIAC CATHETERIZATION N/A 10/18/2015   Procedure: Left Heart Cath and Coronary Angiography;  Surgeon: Burnell Blanks, MD;  Location: [place] CV LAB;  Service: Cardiovascular;  Laterality: N/A;  . CARDIOVERSION N/A 11/24/2015   Procedure: CARDIOVERSION;  Surgeon: Josue Hector, MD;  Location[facility] ENDOSCOPY;  Service: Cardiovascular;  Laterality: N/A;  . LAMINECTOMY     C5/placement of steel plate  . NECK SURGERY  2003   replaced disk  . TEE WITHOUT CARDIOVERSION N/A 10/15/2015   Procedure: TRANSESOPHAGEAL ECHOCARDIOGRAM (TEE);  Surgeon: Larey Dresser, MD;  Location: [facility];  Service: Cardiovascular;  Laterality: N/A;  . TEE WITHOUT CARDIOVERSION N/A 11/24/2015   Procedure: TRANSESOPHAGEAL ECHOCARDIOGRAM (TEE);  Surgeon: Josue Hector, MD;  Location: [facility] ENDOSCOPY;  Service: Cardiovascular;  Laterality: N/A;    Allergies: Patient has no known allergies.  Medications: Prior to Admission medications   Medication Sig Start Date End Date Taking? Authorizing Provider  acetaminophen (TYLENOL) 325 MG tablet Take 2 tablets (650 mg total) by mouth every 6 (six) hours as needed for mild pain (or Fever >/= 101). 12/04/18   Eugenie Filler, MD  Amino Acids-Protein Hydrolys (FEEDING SUPPLEMENT, PRO-STAT SUGAR FREE 64,) LIQD Take 30 mLs by mouth daily. 12/04/18   Eugenie Filler, MD  apixaban (ELIQUIS) 5 MG TABS tablet Take 1 tablet (5 mg total) by mouth 2 (  two) times daily. 02/08/18   Sueanne Margarita, MD  atorvastatin (LIPITOR) 40 MG tablet Take 1 tablet (40 mg total) by mouth daily. Please keep upcoming appt in September for future refills. Thank you Patient taking differently: Take 40 mg by mouth daily.  03/01/18   Imogene Burn, PA-C  levothyroxine (SYNTHROID) 125  MCG tablet TAKE 1 TABLET BY MOUTH EVERY DAY 12/16/18   Burchette, Alinda Sierras, MD  Multiple Vitamin (MULTIVITAMIN WITH MINERALS) TABS tablet Take 1 tablet by mouth daily. 12/05/18   Eugenie Filler, MD  nebivolol (BYSTOLIC) 10 MG tablet Take 1 tablet (10 mg total) by mouth daily. 05/21/18   Sueanne Margarita, MD  OVER THE COUNTER MEDICATION Take 1 tablet by mouth as needed (for cold/pain). Alka seltzer    [provider]     Family History  Problem Relation Age of Onset  . Prostate cancer Father   . Colon cancer Father   . Skin cancer Mother   . Cancer Brother     Social History   Socioeconomic History  . Marital status: Married    Spouse name: Not on file  . Number of children: Not on file  . Years of education: Not on file  . Highest education level: Not on file  Occupational History  . Not on file  Social Needs  . Financial resource strain: Not on file  . Food insecurity:    Worry: Not on file    Inability: Not on file  . Transportation needs:    Medical: Not on file    Non-medical: Not on file  Tobacco Use  . Smoking status: Current Every Day Smoker    Packs/day: 0.50    Years: 20.00    Pack years: 10.00    Types: Cigarettes  . Smokeless tobacco: Never Used  . Tobacco comment: 1/2 pack per day  Substance and Sexual Activity  . Alcohol use: Yes    Alcohol/week: 12.0 standard drinks    Types: 12 Cans of beer per week    Comment: couple of beers each day  . Drug use: No  . Sexual activity: Not on file  Lifestyle  . Physical activity:    Days per week: Not on file    Minutes per session: Not on file  . Stress: Not on file  Relationships  . Social connections:    Talks on phone: Not on file    Gets together: Not on file    Attends religious service: Not on file    Active member of club or organization: Not on file    Attends meetings of clubs or organizations: Not on file    Relationship status: Not on file  Other Topics Concern  . Not on file  Social  History Narrative  . Not on file     Vital Signs: There were no vitals taken for this visit.  Physical Exam  NAD, alert Abdomen: soft, non-tender.  Drain in place. Insertion site c/d/i. Small amount of serosanguinous output.  Imaging: No results found.  Labs:  CBC: Recent Labs    12/01/18 0320 12/02/18 0347 12/03/18 0429 12/04/18 0323  WBC 5.0 4.5 4.5 5.1  HGB 11.8* 12.3* 12.4* 12.5*  HCT 35.5* 36.2* 37.2* 38.2*  PLT 213 253 254 278    COAGS: Recent Labs    09/24/18 1907 11/29/18 2109  INR  --  1.1  APTT 47*  --     BMP: Recent Labs    12/01/18 0320 12/02/18  6283 12/03/18 0429 12/04/18 0323  NA 135 135 136 136  K 3.6 3.6 3.9 4.1  CL 103 102 104 103  CO2 24 25 25 26   GLUCOSE 88 98 98 102*  BUN 10 6* 7* 16  CALCIUM 8.0* 8.2* 8.3* 8.3*  CREATININE 0.73 0.66 0.73 0.68  GFRNONAA >60 >60 >60 >60  GFRAA >60 >60 >60 >60    LIVER FUNCTION TESTS: Recent Labs    09/23/18 0948 09/24/18 0153 11/29/18 1647  BILITOT 0.7 1.0 0.6  AST 18 14* 13*  ALT 12 10 13   ALKPHOS 61 51 73  PROT 7.3 5.9* 7.5  ALBUMIN 3.3* 2.5* 3.0*    Assessment: Diverticular abscess s/p drain placement 11/30/18 by Dr. Kathlene Cote Patient presents in improved condition since discharge from the hospital.  He denies abdominal pain. He has completed antibiotics.  CT imaging and drain injection reviewed by Dr. Earleen Newport. Drain pulled by PA at bedside without complication.  Patient is encouraged to keep his scheduled follow-up with surgery next week.  Informed to contact our office with questions or concerns.  No scheduled follow-up needed in IR at this time.   Signed: Docia Barrier, PA 12/18/2018, 10:18 AM   Please refer to Dr. Earleen Newport attestation of this note for management and plan.

## 2018-12-23 ENCOUNTER — Ambulatory Visit: Payer: Self-pay | Admitting: Surgery

## 2018-12-23 ENCOUNTER — Encounter: Payer: Self-pay | Admitting: Surgery

## 2018-12-23 NOTE — H&P (Signed)
Adrian [name] Documented: 12/23/2018 10:59 AM Location: [facility] Patient #: [id number] DOB: 1955/11/06 Married / Language: English / Race: White Male  History of Present Illness Adrian Hector MD; 12/23/2018 12:02 PM) The patient is a 63 year old male who presents with diverticulitis. Note for "Diverticulitis": ` ` ` Patient returns after treatment for diverticulitis with abscess  Chief Complaint: ` ` Pleasant patient that was found to have diverticulitis and was admitted in February. Had small abscess that seemed to improve with antibiotics. Was sent home on Augmentin. Came back feeling worse and was readmitted in April.. Underwent percutaneous drainage the abscess, another course of IV antibiotics. He improved. Sent home with drain 4/29. He's completed or antibiotics. He had a follow-up drain study which showed no fistula nor abscess was removed on 5/13. He comes in today to discuss surgery given the complex diverticulitis attack. He has a history of atrial fibrillation with some cardiomyopathy. He is on Eliquis. Follow by Dr. [name] with cardiology. 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 Had a G-tube placed to the time. Not needing any more. No recurrence.  Drain removed 12/18/2018  He notes he's feeling better but not back to normal. He had a follow-up CAT scan did show an area of inflammation. Placed back on antibiotics. Nearly done with the second batch. Denies any abdominal pain. Having some occasional urgency and loose bowel movements but nothing too bad. Appetite returning. Energy level not back to normal but improving overall. Denies any urinary frequency or pneumaturia. Feeling much better since the drainage and antibiotics. No bladder issues that he knows of. He works at [employer] doing a lot of heavy intense lifting. He's not been able to get back to work with the recent readmission. He wants to do some work but  does not feel he is ready yet. He is worried about when asked happen.  (Review of systems as stated in this history (HPI) or in the review of systems. Otherwise all other 12 point ROS are negative) ` ` `   Diagnostic Studies History Adrian [name], CMA; 12/23/2018 11:00 AM) Colonoscopy 5-10 years ago  Allergies Adrian [name], CMA; 12/23/2018 11:00 AM) No Known Allergies [12/23/2018]: No Known Drug Allergies [12/23/2018]: Allergies Reconciled  Medication History Adrian [name], CMA; 12/23/2018 11:01 AM) Amoxicillin-Pot Clavulanate (875-125MG  Tablet, 1 (one) Oral two times daily, Taken starting 12/18/2018) Active. Bystolic (10MG  Tablet, Oral) Active. Levothyroxine Sodium (125MCG Tablet, Oral) Active. Eliquis (2.5MG  Tablet, Oral) Active. Medications Reconciled     Review of Systems ([place]; 12/23/2018 11:00 AM) General Present- Fatigue and Weight Loss. Not Present- Appetite Loss, Chills, Fever, Night Sweats and Weight Gain. Respiratory Not Present- Bloody sputum, Chronic Cough, Difficulty Breathing, Snoring and Wheezing.  Vitals (Sabrina Canty CMA; 12/23/2018 11:01 AM) 12/23/2018 11:01 AM Weight: 130.4 lb Height: 68in Body Surface Area: 1.7 m Body Mass Index: 19.83 kg/m  Temp.: 98.60F(Oral)  Pulse: 99 (Regular)  BP: 102/68 (Sitting, Left Arm, Standard)      Physical Exam Adrian Hector MD; 12/23/2018 11:53 AM)  General Mental Status-Alert. General Appearance-Not in acute distress, Not Sickly. Orientation-Oriented X3. Hydration-Well hydrated. Voice-Normal.  Integumentary Global Assessment Upon inspection and palpation of skin surfaces of the - Axillae: non-tender, no inflammation or ulceration, no drainage. and Distribution of scalp and body hair is normal. General Characteristics Temperature - normal warmth is noted.  Head and Neck Head-normocephalic, atraumatic with no lesions or palpable masses. Face Global  Assessment - atraumatic, no absence  of expression. Neck Global Assessment - no abnormal movements, no bruit auscultated on the right, no bruit auscultated on the left, no decreased range of motion, non-tender. Trachea-midline. Thyroid Gland Characteristics - non-tender.  Eye Eyeball - Left-Extraocular movements intact, No Nystagmus. Eyeball - Right-Extraocular movements intact, No Nystagmus. Cornea - Left-No Hazy. Cornea - Right-No Hazy. Sclera/Conjunctiva - Left-No scleral icterus, No Discharge. Sclera/Conjunctiva - Right-No scleral icterus, No Discharge. Pupil - Left-Direct reaction to light normal. Pupil - Right-Direct reaction to light normal.  ENMT Ears Pinna - Left - no drainage observed, no generalized tenderness observed. Right - no drainage observed, no generalized tenderness observed. Nose and Sinuses External Inspection of the Nose - no destructive lesion observed. Inspection of the nares - Left - quiet respiration. Right - quiet respiration. Mouth and Throat Lips - Upper Lip - no fissures observed, no pallor noted. Lower Lip - no fissures observed, no pallor noted. Nasopharynx - no discharge present. Oral Cavity/Oropharynx - Tongue - no dryness observed. Oral Mucosa - no cyanosis observed. Hypopharynx - no evidence of airway distress observed.  Chest and Lung Exam Inspection Movements - Normal and Symmetrical. Accessory muscles - No use of accessory muscles in breathing. Palpation Palpation of the chest reveals - Non-tender. Auscultation Breath sounds - Normal and Clear.  Cardiovascular Auscultation Rhythm - Regular. Murmurs & Other Heart Sounds - Auscultation of the heart reveals - No Murmurs and No Systolic Clicks.  Abdomen Inspection Inspection of the abdomen reveals - No Visible peristalsis and No Abnormal pulsations. Umbilicus - No Bleeding, No Urine drainage. Palpation/Percussion Palpation and Percussion of the abdomen reveal - Soft, Non  Tender, No Rebound tenderness, No Rigidity (guarding) and No Cutaneous hyperesthesia. Note: Abdomen soft. Nontender. Not distended. Left upper corner punctate hole consistent with his prior gastrostomy tube site. Thin abdominal wall. No pain or guarding. No umbilical or incisional hernias. No guarding.  Male Genitourinary Sexual Maturity Tanner 5 - Adult hair pattern and Adult penile size and shape.  Peripheral Vascular Upper Extremity Inspection - Left - No Cyanotic nailbeds, Not Ischemic. Right - No Cyanotic nailbeds, Not Ischemic.  Neurologic Neurologic evaluation reveals -normal attention span and ability to concentrate, able to name objects and repeat phrases. Appropriate fund of knowledge , normal sensation and normal coordination. Mental Status Affect - not angry, not paranoid. Cranial Nerves-Normal Bilaterally. Gait-Normal.  Neuropsychiatric Mental status exam performed with findings of-able to articulate well with normal speech/language, rate, volume and coherence, thought content normal with ability to perform basic computations and apply abstract reasoning and no evidence of hallucinations, delusions, obsessions or homicidal/suicidal ideation.  Musculoskeletal Global Assessment Spine, Ribs and Pelvis - no instability, subluxation or laxity. Right Upper Extremity - no instability, subluxation or laxity.  Lymphatic Head & Neck  General Head & Neck Lymphatics: Bilateral - Description - No Localized lymphadenopathy. Axillary  General Axillary Region: Bilateral - Description - No Localized lymphadenopathy. Femoral & Inguinal  Generalized Femoral & Inguinal Lymphatics: Left - Description - No Localized lymphadenopathy. Right - Description - No Localized lymphadenopathy.    Assessment & Plan Adrian Hector MD; 12/23/2018 11:59 AM)  DIVERTICULITIS OF LARGE INTESTINE WITH ABSCESS WITHOUT BLEEDING (K57.20) Impression: Pleasant patient final recovering from 2  hospital admissions T straining HIV and oral antibiotics to get over a complex recurrent/persistent attack of diverticulitis sitting on his bladder.  I think he would benefit from colectomy to break the cycle of attacks. Would wait at least 6 weeks from drain removal. That would place it in early  July. Ideally he needs get a colonoscopy done beforehand to rule out any tumor or any other lesions. He is overdue for 5 year follow-up since his last colonoscopy was in 2014 by Dr. Henrene Pastor with [facility]ology  Because this was so intimately involved with the bladder and his question colovesical fistula, would like to have urology do firefly injection of the ureters at the start of the case to make sure that is not a factor. Might require repair of a colovesical fistula. While suspicious on the initial CT scan, he has no pneumaturia and there is no evidence of fistula on the drain. Hopefully not too likely.  He's concerned about getting back to work. He wants to get back but does not know if he is able to do things physically yet. I did note he will need up to 6 weeks after surgery before letting him go back unrestricted with his heavy lifting job at [employer]. He already has has FMLA I believe through his PCP.  Current Plans Pt Education - [facility] Diverticular Disease (AT) I recommended to the patient that they have an evaluation with gastroenterology. See if endoscopic evaluation would be of benefit. Management of digestive tract issues. Perhaps adjustment of medications or additions.  PREOP COLON - ENCOUNTER FOR PREOPERATIVE EXAMINATION FOR GENERAL SURGICAL PROCEDURE (Z01.818)  Current Plans You are being scheduled for surgery- Our schedulers will call you.  You should hear from our office's scheduling department within 5 working days about the location, date, and time of surgery. We try to make accommodations for patient's preferences in scheduling surgery, but sometimes the OR schedule or the  surgeon's schedule prevents [place] from making those accommodations.  If you have not heard from our office [phone]) in 5 working days, call the office and ask for your surgeon's nurse.  If you have other questions about your diagnosis, plan, or surgery, call the office and ask for your surgeon's nurse.  Written instructions provided The anatomy & physiology of the digestive tract was discussed. The pathophysiology of the colon was discussed. Natural history risks without surgery was discussed. I feel the risks of no intervention will lead to serious problems that outweigh the operative risks; therefore, I recommended a partial colectomy to remove the pathology. Minimally invasive (Robotic/Laparoscopic) & open techniques were discussed.  Risks such as bleeding, infection, abscess, leak, reoperation, possible ostomy, hernia, heart attack, death, and other risks were discussed. I noted a good likelihood this will help address the problem. Goals of post-operative recovery were discussed as well. Need for adequate nutrition, daily bowel regimen and healthy physical activity, to optimize recovery was noted as well. We will work to minimize complications. Educational materials were available as well. Questions were answered. The patient expresses understanding & wishes to proceed with surgery.  Pt Education - [facility] Colon Bowel Prep 2018 ERAS/Miralax/Antibiotics Started Neomycin Sulfate 500 MG Oral Tablet, 2 (two) Tablet SEE NOTE, #6, 12/23/2018, No Refill. Local Order: TAKE TWO TABLETS AT 2 PM, 3 PM, AND 10 PM THE DAY PRIOR TO SURGERY Started Flagyl 500 MG Oral Tablet, 2 (two) Tablet SEE NOTE, #6, 12/23/2018, No Refill. Local Order: Take at 2pm, 3pm, and 10pm the day prior to your colon operation Pt Education - Pamphlet Given - Laparoscopic Colorectal Surgery: discussed with patient and provided information. Pt Education - [facility] Colectomy post-op instructions: discussed with patient and  provided information.  Adrian Hector, MD, FACS, MASCRS Gastrointestinal and Minimally Invasive Surgery    [phone]  [place], [place] [address] [phone] Main / Paging [phone] Fax

## 2018-12-25 ENCOUNTER — Telehealth: Payer: Self-pay | Admitting: Internal Medicine

## 2018-12-25 ENCOUNTER — Encounter: Payer: Self-pay | Admitting: Internal Medicine

## 2018-12-31 ENCOUNTER — Other Ambulatory Visit: Payer: Self-pay | Admitting: Urology

## 2018-12-31 NOTE — Telephone Encounter (Signed)
Adrian [name] has this referral come in yet?

## 2018-12-31 NOTE — Telephone Encounter (Signed)
Adrian [name] from [facility] called in stating that referral was faxed on 12/23/18. Adrian [name] states that colon needs to be scheduled on 03/06/19 because procedure with Dr. Johney Maine is on 03/07/19.

## 2019-01-01 ENCOUNTER — Telehealth: Payer: Self-pay

## 2019-01-01 NOTE — Telephone Encounter (Signed)
   Primary Cardiologist: Fransico Him, MD  Chart reviewed as part of pre-operative protocol coverage. Patient was contacted 01/01/2019 in reference to pre-operative risk assessment for pending surgery as outlined below.  KEAGEN HEINLEN was last seen on 05/06/18 by Dr. [name].  Since that day, NAGEE GOATES has done well. He denies any new or worsening cardiac symptoms. He can complete more than 4.0 METS. He had nonobstructive disease by heart cath in 2017. He reports that colonoscopy is planned for 03/06/2019 and surgery is scheduled for 03/07/2019.   Per our pharmacy staff: Have patient hold Eliquis 2 days prior to colonoscopy based on information from surgical clearance.  CHADS2-VASc score is 2.   Therefore, based on ACC/AHA guidelines, the patient would be at acceptable risk for the planned procedure without further cardiovascular testing.   The above recommendations are generally good for two months.  I will route this recommendation to the requesting party via Epic fax function and remove from pre-op pool.  Please call with questions.  Tami Lin Duke, PA 01/01/2019, 12:26 PM

## 2019-01-01 NOTE — Telephone Encounter (Signed)
[facility] Pre-operative Risk Assessment     Request for surgical clearance:     Endoscopy Procedure  What type of surgery is being performed?     Colonoscopy  When is this surgery scheduled?     03/06/19  What type of clearance is required ?   Pharmacy  Are there any medications that need to be held prior to surgery and how long? Eliquis  Practice name and name of physician performing surgery?      [facility]  What is your office phone and fax number?      Phone- [phone]  [phone]  Anesthesia type (None, local, MAC, general) ?       MAC

## 2019-01-01 NOTE — Telephone Encounter (Signed)
Have patient hold Eliquis 2 days prior to colonoscopy based on information from surgical clearance.  CHADS2-VASc score is 2.

## 2019-01-01 NOTE — Telephone Encounter (Signed)
Pharmacy - previous request was for a surgical intervention. It appears this patient will have a colonoscopy, for which we are asked to hold eliquis, followed by surgery the next day.  Given the two procedures, can you please comment on holding eliquis (does this change previous recommendations)?

## 2019-01-01 NOTE — Telephone Encounter (Signed)
Yes routine telehealth medicine visit at least several weeks advance of his planned colonoscopy (which we can keep at the currently scheduled time).  I will need all of the relevant surgical records and plans regarding his visit.  Thanks

## 2019-01-01 NOTE — Telephone Encounter (Signed)
Dr. Henrene Pastor please see note below. Pt scheduled for colon with Dr. Henrene Pastor on 03/06/19@1 :30pm in the [facility]. Do you want to see pt prior to the colon? Previsit is scheduled and letter sent to cardiology regarding Eliquis. Please advise.

## 2019-01-01 NOTE — Telephone Encounter (Signed)
Pt scheduled for telehealth visit 02/05/19@9 :30am. Appt letter mailed to pt. [facility] called and left message for Caryl Pina to send records with the surgical plan prior to his OV so that Dr. Henrene Pastor will be able to review them prior to his telehealth visit.

## 2019-02-04 ENCOUNTER — Telehealth: Payer: Self-pay

## 2019-02-04 NOTE — Telephone Encounter (Signed)
Phone clearance comlete

## 2019-02-05 ENCOUNTER — Encounter: Payer: Self-pay | Admitting: Internal Medicine

## 2019-02-05 ENCOUNTER — Ambulatory Visit (INDEPENDENT_AMBULATORY_CARE_PROVIDER_SITE_OTHER): Payer: BC Managed Care – PPO | Admitting: Internal Medicine

## 2019-02-05 VITALS — Ht 68.0 in | Wt 131.0 lb

## 2019-02-05 DIAGNOSIS — K5720 Diverticulitis of large intestine with perforation and abscess without bleeding: Secondary | ICD-10-CM

## 2019-02-05 DIAGNOSIS — Z860100 Personal history of colon polyps, unspecified: Secondary | ICD-10-CM

## 2019-02-05 DIAGNOSIS — Z7901 Long term (current) use of anticoagulants: Secondary | ICD-10-CM

## 2019-02-05 DIAGNOSIS — N321 Vesicointestinal fistula: Secondary | ICD-10-CM | POA: Diagnosis not present

## 2019-02-05 NOTE — Patient Instructions (Signed)
1.  KEEP SCHEDULED COLONOSCOPY in the [facility] for March 06, 2019.  Pediatric colonoscope.    2.  HOLD Eliquis therapy 2 days prior to the procedure   3.  KEEP scheduled colonoscopy PREVISIT appointment February 10, 2019

## 2019-02-05 NOTE — Progress Notes (Signed)
HISTORY OF PRESENT ILLNESS:  Adrian [name] is a 63 y.o. male with with multiple significant cardiac problems as listed below as well as history of tonsillar cancer treated with radiation.  He has a history of paroxysmal atrial arrhythmia for which she is on chronic Eliquis therapy.  His last cardiac ejection fraction was between 55 and 60%.  He is sent today by his general surgeon Dr. gross regarding the need for preoperative colonoscopy.  Patient has a history of prior colonoscopy in January 2014 which revealed a tubular adenoma.  As well left-sided diverticulosis.  Follow-up in 5 years recommended.  Recall letter sent but not acted upon.  He was well until September 23, 2018 when he was admitted to the hospital with acute diverticular abscess which was treated with antibiotics.  He was discharged home after 3 days.  He was readmitted to the hospital November 29, 2018 through December 04, 2018 with abscess and colo-vesicle fistula.  He was treated with antibiotics and percutaneous drainage therapy.  At the time of his last CT scan (reviewed) Dec 18, 2018 his abscess resolved and there was no evidence of fistula.  His drain removed.  He is scheduled for surgical resection of his diseased sigmoid colon with Dr. gross March 07, 2019.  He is tentatively set up for preoperative colonoscopy March 06, 2019.  Review of most recent laboratories from December 04, 2018 finds unremarkable comprehensive metabolic panel.  Unremarkable CBC with hemoglobin 12.5.  Normal white blood cell count 5.1.  Currently the patient states that he is feeling well.  No abdominal pain, fevers, or urinary complaints.  He did lose 20 pounds throughout the course of his illness but has been able to gain back 3 pounds.  He does have a colonoscopy pre-visit scheduled for February 10, 2019.  REVIEW OF SYSTEMS:  All non-GI ROS negative unless otherwise stated in the HPI except for decreased energy levels.  Past Medical History:  Diagnosis Date  . CAD (coronary  artery disease), native coronary artery 12/06/2015   a. 10/2015 MV: EF  37%, reversible defect inferior apex, intermediate risk findings; b. 10/2015 Cath: 20% mid RCA.  . Diverticulitis   . History of chemotherapy 2005   Cisplatin  . Hypothyroidism   . NICM (nonischemic cardiomyopathy) ([place]) 10/14/2015   a. Tachy mediated?;  b. Echo 3/17 - Mild concentric LVH, EF 30-35%, anteroseptal, anterior, anterolateral, apical anterior, lateral hypokinesis, trivial MR, mild to moderately reduced RVSF; c. LHC 3/17 - mRCA 20%  . Paroxysmal atrial flutter ([place])    a. TEE 3/17 with ? LAA clot-->s/p TEE/DCCV 11/24/2015  . Radiation NOv.3,2005-Dec. 15, 2005   6810 cGy in 30 fractions  . Tonsil cancer [facility] 2005   Dr Romeo Rabon.  XRT    Past Surgical History:  Procedure Laterality Date  . CARDIAC CATHETERIZATION N/A 10/18/2015   Procedure: Left Heart Cath and Coronary Angiography;  Surgeon: Burnell Blanks, MD;  Location: [facility];  Service: Cardiovascular;  Laterality: N/A;  . CARDIOVERSION N/A 11/24/2015   Procedure: CARDIOVERSION;  Surgeon: Josue Hector, MD;  Location: [facility];  Service: Cardiovascular;  Laterality: N/A;  . GASTROSTOMY TUBE PLACEMENT  07/04/2004   IR - G tube for tonsilar cancer  . IR RADIOLOGIST EVAL & MGMT  12/18/2018  . LAMINECTOMY     C5/placement of steel plate  . NECK SURGERY  2003   replaced disk  . TEE WITHOUT CARDIOVERSION N/A 10/15/2015   Procedure: TRANSESOPHAGEAL ECHOCARDIOGRAM (TEE);  Surgeon: Kirk Ruths  Claris Gladden, MD;  Location: [facility];  Service: Cardiovascular;  Laterality: N/A;  . TEE WITHOUT CARDIOVERSION N/A 11/24/2015   Procedure: TRANSESOPHAGEAL ECHOCARDIOGRAM (TEE);  Surgeon: Josue Hector, MD;  Location: [facility];  Service: Cardiovascular;  Laterality: N/A;    Social History Alan Ripper Ausley  reports that he has been smoking cigarettes. He has a 10.00 pack-year smoking history. He has never used smokeless tobacco. He reports current alcohol  use of about 12.0 standard drinks of alcohol per week. He reports that he does not use drugs.  family history includes Cancer in his brother; Colon cancer in his father; Prostate cancer in his father; Skin cancer in his mother.  No Known Allergies     PHYSICAL EXAMINATION: No physical examination with telehealth medicine visit   ASSESSMENT:  1.  Complicated diverticulitis with diverticular abscess and subsequent colovesical fistula status post successful treatment with antibiotics and percutaneous drainage.  Anticipating segmental surgical resection of his diseased sigmoid colon with Dr. Johney Maine.  Preoperative colonoscopy for neoplasia surveillance requested by Dr. Johney Maine 2.  Colonoscopy January 2014 with adenomatous colon polyp and diverticular disease 3.  Multiple significant medical problems including a history of atrial arrhythmia on chronic Eliquis therapy.  We have reached out to his cardiologist Dr. [name] who approved holding his blood thinner for 2 days prior to his procedure  PLAN:  1.  KEEP SCHEDULED COLONOSCOPY in the [facility] for March 06, 2019.  Pediatric colonoscope.  Patient is high risk given his comorbidities and the need to disrupt his anticoagulation therapy.The nature of the procedure, as well as the risks, benefits, and alternatives were carefully and thoroughly reviewed with the patient. Ample time for discussion and questions allowed. The patient understood, was satisfied, and agreed to proceed. 2.  HOLD Eliquis therapy 2 days prior to the procedure  3.  KEEP scheduled colonoscopy PREVISIT appointment February 10, 2019 This telehealth medicine visit was initiated by and consented for by the patient.  He was in his home and I was in my office during the encounter.  He understands it may be professional charge associated with this service

## 2019-02-10 ENCOUNTER — Other Ambulatory Visit: Payer: Self-pay

## 2019-02-10 ENCOUNTER — Ambulatory Visit (AMBULATORY_SURGERY_CENTER): Payer: Self-pay

## 2019-02-10 VITALS — Ht 68.0 in | Wt 132.0 lb

## 2019-02-10 DIAGNOSIS — Z86010 Personal history of colonic polyps: Secondary | ICD-10-CM

## 2019-02-10 MED ORDER — NA SULFATE-K SULFATE-MG SULF 17.5-3.13-1.6 GM/177ML PO SOLN: 1.0000 | Freq: Once | ORAL | 0 refills | Status: AC

## 2019-02-10 NOTE — Progress Notes (Signed)
Denies allergies to eggs or soy products. Denies complication of anesthesia or sedation. Denies use of weight loss medication. Denies use of O2.   Emmi instructions given for colonoscopy.  Pre-Visit was conducted by phone due to Covid 19. Instructions were reviewed and mailed to patients confirmed home address. A 15.00 coupon for Suprep was given to the patient. Patient was encouraged to call if he had questions regarding instructions.

## 2019-03-04 ENCOUNTER — Other Ambulatory Visit (HOSPITAL_COMMUNITY)
Admission: RE | Admit: 2019-03-04 | Discharge: 2019-03-04 | Disposition: A | Discharge status: Home / Self Care | Payer: BC Managed Care – PPO | Source: Ambulatory Visit | Attending: Surgery | Admitting: Surgery

## 2019-03-04 DIAGNOSIS — Z20828 Contact with and (suspected) exposure to other viral communicable diseases: Secondary | ICD-10-CM | POA: Diagnosis present

## 2019-03-04 LAB — SARS CORONAVIRUS 2 (TAT 6-24 HRS): SARS Coronavirus 2: NEGATIVE

## 2019-03-04 NOTE — Patient Instructions (Addendum)
YOU HAVE HAD A COVID 19 TEST,  PLEASE CONTINUE THE QUARANTINE INSTRUCTIONS AS OUTLINED IN YOUR HANDOUT.                Adrian [name]  03/04/2019   Your procedure is scheduled on: 03-07-19    Report to [facility] Main  Entrance    Report to Admitting at 10:30 AM   1 VISITOR IS ALLOWED TO WAIT IN WAITING ROOM  ONLY DAY OF YOUR SURGERY.    Call this number if you have problems the morning of surgery [phone]    Remember: Do not eat food or drink liquids :After Midnight.      Take these medicines the morning of surgery with A SIP OF WATER:  Levothyroxine (Synthroid), and Nebivolol (Bystolic)  BRUSH YOUR TEETH MORNING OF SURGERY AND RINSE YOUR MOUTH OUT, NO CHEWING GUM CANDY OR MINTS.                                You may not have any metal on your body including hair pins and              piercings     Do not wear jewelry, cologne, lotions, powders or deodorant                          Men may shave face and neck.   Do not bring valuables to the hospital. [place].  Contacts, dentures or bridgework may not be worn into surgery.    Special Instructions: N/A              Please read over the following fact sheets you were given: _____________________________________________________________________             [facility] - Preparing for Surgery Before surgery, you can play an important role.  Because skin is not sterile, your skin needs to be as free of germs as possible.  You can reduce the number of germs on your skin by washing with CHG (chlorahexidine gluconate) soap before surgery.  CHG is an antiseptic cleaner which kills germs and bonds with the skin to continue killing germs even after washing. Please DO NOT use if you have an allergy to CHG or antibacterial soaps.  If your skin becomes reddened/irritated stop using the CHG and inform your nurse when you arrive at Short Stay. Do not shave (including legs  and underarms) for at least 48 hours prior to the first CHG shower.  You may shave your face/neck. Please follow these instructions carefully:  1.  Shower with CHG Soap the night before surgery and the  morning of Surgery.  2.  If you choose to wash your hair, wash your hair first as usual with your  normal  shampoo.  3.  After you shampoo, rinse your hair and body thoroughly to remove the  shampoo.                           4.  Use CHG as you would any other liquid soap.  You can apply chg directly  to the skin and wash  Gently with a scrungie or clean washcloth.  5.  Apply the CHG Soap to your body ONLY FROM THE NECK DOWN.   Do not use on face/ open                           Wound or open sores. Avoid contact with eyes, ears mouth and genitals (private parts).                       Wash face,  Genitals (private parts) with your normal soap.             6.  Wash thoroughly, paying special attention to the area where your surgery  will be performed.  7.  Thoroughly rinse your body with warm water from the neck down.  8.  DO NOT shower/wash with your normal soap after using and rinsing off  the CHG Soap.                9.  Pat yourself dry with a clean towel.            10.  Wear clean pajamas.            11.  Place clean sheets on your bed the night of your first shower and do not  sleep with pets. Day of Surgery : Do not apply any lotions/deodorants the morning of surgery.  Please wear clean clothes to the hospital/surgery center.  FAILURE TO FOLLOW THESE INSTRUCTIONS MAY RESULT IN THE CANCELLATION OF YOUR SURGERY PATIENT SIGNATURE_________________________________  NURSE SIGNATURE__________________________________  ________________________________________________________________________

## 2019-03-04 NOTE — Progress Notes (Signed)
12-03-18 ( Epic) Cardiac Clearance from Fabian Sharp, [place]  09-23-18 [facility]) EKG

## 2019-03-05 ENCOUNTER — Other Ambulatory Visit: Payer: Self-pay

## 2019-03-05 ENCOUNTER — Encounter (HOSPITAL_COMMUNITY): Payer: Self-pay

## 2019-03-05 ENCOUNTER — Telehealth: Payer: Self-pay | Admitting: Internal Medicine

## 2019-03-05 ENCOUNTER — Telehealth: Payer: Self-pay | Admitting: *Deleted

## 2019-03-05 ENCOUNTER — Encounter (HOSPITAL_COMMUNITY)
Admission: RE | Admit: 2019-03-05 | Discharge: 2019-03-05 | Disposition: A | Discharge status: Home / Self Care | Payer: BC Managed Care – PPO | Source: Ambulatory Visit | Attending: Surgery | Admitting: Surgery

## 2019-03-05 DIAGNOSIS — I480 Paroxysmal atrial fibrillation: Secondary | ICD-10-CM | POA: Diagnosis not present

## 2019-03-05 DIAGNOSIS — Z79899 Other long term (current) drug therapy: Secondary | ICD-10-CM | POA: Diagnosis not present

## 2019-03-05 DIAGNOSIS — E039 Hypothyroidism, unspecified: Secondary | ICD-10-CM | POA: Diagnosis not present

## 2019-03-05 DIAGNOSIS — Z7901 Long term (current) use of anticoagulants: Secondary | ICD-10-CM | POA: Insufficient documentation

## 2019-03-05 DIAGNOSIS — Z01818 Encounter for other preprocedural examination: Secondary | ICD-10-CM | POA: Insufficient documentation

## 2019-03-05 DIAGNOSIS — K5792 Diverticulitis of intestine, part unspecified, without perforation or abscess without bleeding: Secondary | ICD-10-CM | POA: Diagnosis not present

## 2019-03-05 DIAGNOSIS — I428 Other cardiomyopathies: Secondary | ICD-10-CM | POA: Diagnosis not present

## 2019-03-05 DIAGNOSIS — F17210 Nicotine dependence, cigarettes, uncomplicated: Secondary | ICD-10-CM | POA: Diagnosis not present

## 2019-03-05 DIAGNOSIS — I2510 Atherosclerotic heart disease of native coronary artery without angina pectoris: Secondary | ICD-10-CM | POA: Diagnosis not present

## 2019-03-05 DIAGNOSIS — Z79890 Hormone replacement therapy (postmenopausal): Secondary | ICD-10-CM | POA: Insufficient documentation

## 2019-03-05 LAB — CBC
HCT: 46.6 % (ref 39.0–52.0)
Hemoglobin: 15.1 g/dL (ref 13.0–17.0)
MCH: 30.4 pg (ref 26.0–34.0)
MCHC: 32.4 g/dL (ref 30.0–36.0)
MCV: 94 fL (ref 80.0–100.0)
Platelets: 168 10*3/uL (ref 150–400)
RBC: 4.96 MIL/uL (ref 4.22–5.81)
RDW: 16.5 % — ABNORMAL HIGH (ref 11.5–15.5)
WBC: 4.1 10*3/uL (ref 4.0–10.5)
nRBC: 0 % (ref 0.0–0.2)

## 2019-03-05 LAB — BASIC METABOLIC PANEL
Anion gap: 8 (ref 5–15)
BUN: 14 mg/dL (ref 8–23)
CO2: 28 mmol/L (ref 22–32)
Calcium: 9 mg/dL (ref 8.9–10.3)
Chloride: 101 mmol/L (ref 98–111)
Creatinine, Ser: 0.7 mg/dL (ref 0.61–1.24)
GFR calc Af Amer: >60 mL/min (ref >60)
GFR calc non Af Amer: >60 mL/min (ref >60)
Glucose, Bld: 110 mg/dL — ABNORMAL HIGH (ref 70–99)
Potassium: 4.9 mmol/L (ref 3.5–5.1)
Sodium: 137 mmol/L (ref 135–145)

## 2019-03-05 LAB — HEMOGLOBIN A1C
Hgb A1c MFr Bld: 5.6 % (ref 4.8–5.6)
Mean Plasma Glucose: 114.02 mg/dL

## 2019-03-05 NOTE — Telephone Encounter (Signed)
Pt's wife was under the impression that Dr. Henrene Pastor had advised that pt needed hospital colonoscopy due to a tear in his intestines.

## 2019-03-05 NOTE — Telephone Encounter (Signed)
Spoke with patient regarding Covid-19 screening questions. Covid-19 Screening Questions:  Do you now or have you had a fever in the last 14 days? no  Do you have any respiratory symptoms of shortness of breath or cough now or in the last 14 days? No  Do you have any family members or close contacts with diagnosed or suspected Covid-19 in the past 14 days? no  Have you been tested for Covid-19 and found to be positive?  Yes, Negative   Pt made aware of that care partner may wait in the car or come up to the lobby during the procedure but will need to provide their own mask.

## 2019-03-05 NOTE — Telephone Encounter (Signed)
Pts wife had called and was questioning patient having his colonoscopy done at the hospital due to his "tear in his intestines." I looked at Dr. Pearletha Furl note and he had noted a sigmoid abcess and recommended we use a pediatric scope and proceed at the [facility]. Patient verbalized understanding.

## 2019-03-06 ENCOUNTER — Encounter: Payer: Self-pay | Admitting: Internal Medicine

## 2019-03-06 ENCOUNTER — Ambulatory Visit (AMBULATORY_SURGERY_CENTER): Payer: BC Managed Care – PPO | Admitting: Internal Medicine

## 2019-03-06 VITALS — BP 119/72 | HR 56 | Temp 98.6°F | Resp 21 | Ht 68.0 in | Wt 132.0 lb

## 2019-03-06 DIAGNOSIS — K5669 Other partial intestinal obstruction: Secondary | ICD-10-CM

## 2019-03-06 DIAGNOSIS — Z86010 Personal history of colonic polyps: Secondary | ICD-10-CM

## 2019-03-06 DIAGNOSIS — Z538 Procedure and treatment not carried out for other reasons: Secondary | ICD-10-CM

## 2019-03-06 DIAGNOSIS — K5720 Diverticulitis of large intestine with perforation and abscess without bleeding: Secondary | ICD-10-CM | POA: Diagnosis not present

## 2019-03-06 MED ORDER — BUPIVACAINE LIPOSOME 1.3 % IJ SUSP
20.0000 mL | Freq: Once | INTRAMUSCULAR | Status: DC
  Filled 2019-03-06: qty 20

## 2019-03-06 MED ORDER — SODIUM CHLORIDE 0.9 % IV SOLN: 500.0000 mL | Freq: Once | INTRAVENOUS | Status: DC

## 2019-03-06 MED ORDER — SODIUM CHLORIDE 0.9 % IV SOLN
INTRAVENOUS | Status: DC
  Filled 2019-03-06: qty 6

## 2019-03-06 NOTE — Op Note (Signed)
[facility] Patient Name: Adrian [name] Procedure Date: 03/06/2019 1:28 PM MRN: [id number] Endoscopist: Docia Chuck. Henrene Pastor , MD Age: 63 Referring MD:  Date of Birth: August 03, 1956 Gender: Male Account #: [id number] Procedure:                Colonoscopy Indications:              High risk colon cancer surveillance: Personal                            history of non-advanced adenoma. Previous                            examination 2014. Also, problems in recent months                            with complicated diverticular disease with abscess,                            fistula, and temporary drainage. He is anticipating                            surgical resection tomorrow Medicines:                Monitored Anesthesia Care Procedure:                Pre-Anesthesia Assessment:                           - Prior to the procedure, a History and Physical                            was performed, and patient medications and                            allergies were reviewed. The patient's tolerance of                            previous anesthesia was also reviewed. The risks                            and benefits of the procedure and the sedation                            options and risks were discussed with the patient.                            All questions were answered, and informed consent                            was obtained. Prior Anticoagulants: The patient has                            taken Eliquis (apixaban), last dose was 2 days  prior to procedure. ASA Grade Assessment: III - A                            patient with severe systemic disease. After                            reviewing the risks and benefits, the patient was                            deemed in satisfactory condition to undergo the                            procedure.                           After obtaining informed consent, the colonoscope                            was  passed under direct vision. Throughout the                            procedure, the patient's blood pressure, pulse, and                            oxygen saturations were monitored continuously. The                            Colonoscope was introduced through the anus and                            advanced to the the rectosigmoid colon. The rectum                            was photographed. The quality of the bowel                            preparation was excellent. The colonoscopy was                            incomplete. See below. The patient tolerated the                            procedure well. The bowel preparation used was                            SUPREP via split dose instruction. Scope In: 2:06:38 PM Scope Out: 2:16:11 PM Total Procedure Duration: 0 hours 9 minutes 33 seconds  Findings:                 There was marked fixed high-grade rectosigmoid                            stenosis which would not permit passage of the  pediatric colonoscope. Diverticula were found in                            the sigmoid colon. The rectum was normal.                            Retroflexed view demonstrated internal hemorrhoids. Complications:            No immediate complications. Estimated blood loss:                            None. Estimated Blood Loss:     Estimated blood loss: none. Impression:               1. Fixed rectosigmoid junction due to complicated                            diverticular disease with stenosis not permitting                            colonoscopic exam.                           2. Diverticulosis                           3. History of nonadvanced adenoma 2014 Recommendation:           1. Keep plans for surgery tomorrow                           2. I have forwarded the information from today's                            examination to your surgeon                           3. Repeat colonoscopy 1 year. Please make recall                            4. Continue to hold your Eliquis in anticipation                            for tomorrow surgery. Docia Chuck. Henrene Pastor, MD 03/06/2019 2:38:09 PM This report has been signed electronically.

## 2019-03-06 NOTE — Progress Notes (Signed)
To PACU, VSS. Report to [email or web address].tb 

## 2019-03-06 NOTE — Patient Instructions (Signed)
YOU HAD AN ENDOSCOPIC PROCEDURE TODAY AT THE [facility]:   Refer to the procedure report that was given to you for any specific questions about what was found during the examination.  If the procedure report does not answer your questions, please call your gastroenterologist to clarify.  If you requested that your care partner not be given the details of your procedure findings, then the procedure report has been included in a sealed envelope for you to review at your convenience later.  YOU SHOULD EXPECT: Some feelings of bloating in the abdomen. Passage of more gas than usual.  Walking can help get rid of the air that was put into your GI tract during the procedure and reduce the bloating. If you had a lower endoscopy (such as a colonoscopy or flexible sigmoidoscopy) you may notice spotting of blood in your stool or on the toilet paper. If you underwent a bowel prep for your procedure, you may not have a normal bowel movement for a few days.  Please Note:  You might notice some irritation and congestion in your nose or some drainage.  This is from the oxygen used during your procedure.  There is no need for concern and it should clear up in a day or so.  SYMPTOMS TO REPORT IMMEDIATELY:   Following lower endoscopy (colonoscopy or flexible sigmoidoscopy):  Excessive amounts of blood in the stool  Significant tenderness or worsening of abdominal pains  Swelling of the abdomen that is new, acute  Fever of 100F or higher  For urgent or emergent issues, a gastroenterologist can be reached at any hour by calling [phone].   DIET:  We do recommend a small meal at first, but then you may proceed to your regular diet.  Drink plenty of fluids but you should avoid alcoholic beverages for 24 hours.  ACTIVITY:  You should plan to take it easy for the rest of today and you should NOT DRIVE or use heavy machinery until tomorrow (because of the sedation medicines used during the test).     FOLLOW UP: Our staff will call the number listed on your records 48-72 hours following your procedure to check on you and address any questions or concerns that you may have regarding the information given to you following your procedure. If we do not reach you, we will leave a message.  We will attempt to reach you two times.  During this call, we will ask if you have developed any symptoms of COVID 19. If you develop any symptoms (ie: fever, flu-like symptoms, shortness of breath, cough etc.) before then, please call [phone].  If you test positive for Covid 19 in the 2 weeks post procedure, please call and report this information to us.    If any biopsies were taken you will be contacted by phone or by letter within the next 1-3 weeks.  Please call us at [phone] if you have not heard about the biopsies in 3 weeks.    SIGNATURES/CONFIDENTIALITY: You and/or your care partner have signed paperwork which will be entered into your electronic medical record.  These signatures attest to the fact that that the information above on your After Visit Summary has been reviewed and is understood.  Full responsibility of the confidentiality of this discharge information lies with you and/or your care-partner. 

## 2019-03-06 NOTE — Progress Notes (Signed)
Anesthesia Chart Review   Case: [id number] Date/Time: 03/07/19 1200   Procedures:      XI ROBOTIC RESECTION OF SIGMOID COLON, RIGID PROCTOSCOPY (N/A )     CYSTOSCOPY WITH STENT PLACEMENT FIREFLY INJECTION (N/A )   Anesthesia type: General   Pre-op diagnosis: DIVERTICULITIS   Location: [facility] ROOM 02 / [facility]   Surgeon: Michael Boston, MD; Ardis Hughs, MD      DISCUSSION:63 y.o. current every day smoker (10 pack years) with h/o nonobstructive CAD, NICM, hypothyroidism, PAF, diverticulitis scheduled for above procedure 03/07/2019 with Dr. Michael Boston, [place].   Pt cleared by cardiology.  Per Fabian Sharp, PA-C, "Adrian [name] was last seen on 05/06/18 by Dr. [name].  Since that day, Adrian [name] has done well. He denies any new or worsening cardiac symptoms. He can complete more than 4.0 METS. He had nonobstructive disease by heart cath in 2017. He reports that colonoscopy is planned for 03/06/2019 and surgery is scheduled for 03/07/2019.  Per our pharmacy staff: Have patient hold Eliquis 2 days prior to colonoscopy based on information from surgical clearance. CHADS2-VASc score is 2. Therefore, based on ACC/AHA guidelines, the patient would be at acceptable risk for the planned procedure without further cardiovascular testing. The above recommendations are generally good for two months."  Anticipate pt can proceed with planned procedure barring acute status change.   VS: BP 128/74   Pulse 61   Temp 36.7 C (Oral)   Resp 16   Ht 5\' 8"  (1.727 m)   Wt 60.8 kg   SpO2 100%   BMI 20.39 kg/m   PROVIDERS: [name], MD is PCP   Fransico Him, MD is Cardiologist  LABS: Labs reviewed: Acceptable for surgery. (all labs ordered are listed, but only abnormal results are displayed)  Labs Reviewed  BASIC METABOLIC PANEL - Abnormal; Notable for the following components:      Result Value   Glucose, Bld 110 (*)    All other components within normal limits  CBC - Abnormal;  Notable for the following components:   RDW 16.5 (*)    All other components within normal limits  HEMOGLOBIN A1C     IMAGES:   EKG: 09/24/2018 Rate 83 bpm Sinus rhythm  Left axis deviation  RSR' in V1 or V2, probably normal variant  CV: Echo 02/14/16 Study Conclusions  - Left ventricle: The cavity size was normal. Systolic function was   normal. The estimated ejection fraction was in the range of 55%   to 60%. Wall motion was normal; there were no regional wall   motion abnormalities.  Cardiac Cath 10/18/15  Mid RCA lesion, 20% stenosed.   1. Mild non-obstructive CAD 2. Atrial flutter with RVR 3. Cardiomyopathy is non-ischemic, likely rate related.  Past Medical History:  Diagnosis Date  . CAD (coronary artery disease), native coronary artery 12/06/2015   a. 10/2015 MV: EF  37%, reversible defect inferior apex, intermediate risk findings; b. 10/2015 Cath: 20% mid RCA.  . Diverticulitis   . History of chemotherapy 2005   Cisplatin  . Hypothyroidism   . NICM (nonischemic cardiomyopathy) ([place]) 10/14/2015   a. Tachy mediated?;  b. Echo 3/17 - Mild concentric LVH, EF 30-35%, anteroseptal, anterior, anterolateral, apical anterior, lateral hypokinesis, trivial MR, mild to moderately reduced RVSF; c. LHC 3/17 - mRCA 20%  . Paroxysmal atrial flutter ([place])    a. TEE 3/17 with ? LAA clot-->s/p TEE/DCCV 11/24/2015  . Radiation NOv.3,2005-Dec. 15, 2005  6810 cGy in 30 fractions  . 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  XRT    Past Surgical History:  Procedure Laterality Date  . CARDIAC CATHETERIZATION N/A 10/18/2015   Procedure: Left Heart Cath and Coronary Angiography;  Surgeon: Burnell Blanks, MD;  Location: [facility];  Service: Cardiovascular;  Laterality: N/A;  . CARDIOVERSION N/A 11/24/2015   Procedure: CARDIOVERSION;  Surgeon: Josue Hector, MD;  Location: [facility];  Service: Cardiovascular;  Laterality: N/A;  . GASTROSTOMY TUBE PLACEMENT  07/04/2004    IR - G tube for tonsilar cancer  . IR RADIOLOGIST EVAL & MGMT  12/18/2018  . LAMINECTOMY     C5/placement of steel plate  . NECK SURGERY  2003   replaced disk  . TEE WITHOUT CARDIOVERSION N/A 10/15/2015   Procedure: TRANSESOPHAGEAL ECHOCARDIOGRAM (TEE);  Surgeon: Larey Dresser, MD;  Location: [facility];  Service: Cardiovascular;  Laterality: N/A;  . TEE WITHOUT CARDIOVERSION N/A 11/24/2015   Procedure: TRANSESOPHAGEAL ECHOCARDIOGRAM (TEE);  Surgeon: Josue Hector, MD;  Location: [facility];  Service: Cardiovascular;  Laterality: N/A;    MEDICATIONS: . acetaminophen (TYLENOL) 325 MG tablet  . apixaban (ELIQUIS) 5 MG TABS tablet  . atorvastatin (LIPITOR) 40 MG tablet  . levothyroxine (SYNTHROID) 125 MCG tablet  . Multiple Vitamin (MULTIVITAMIN WITH MINERALS) TABS tablet  . nebivolol (BYSTOLIC) 10 MG tablet   No current facility-administered medications for this encounter.     Maia Plan [facility] Pre-Surgical Testing [phone] 03/06/19  10:10 AM

## 2019-03-06 NOTE — Anesthesia Preprocedure Evaluation (Addendum)
Anesthesia Evaluation  Patient identified by MRN, date of birth, ID band Patient awake    Reviewed: Allergy & Precautions, NPO status , Patient's Chart, lab work & pertinent test results, reviewed documented beta blocker date and time   Airway Mallampati: II  TM Distance: >3 FB Neck ROM: Full    Dental no notable dental hx. (+) Teeth Intact   Pulmonary Current Smoker,    Pulmonary exam normal breath sounds clear to auscultation       Cardiovascular hypertension, Pt. on medications and Pt. on home beta blockers + CAD  Normal cardiovascular exam+ dysrhythmias Atrial Fibrillation  Rhythm:Regular Rate:Normal  Non Ischemic CM LVEF 20% RCA 2017  EKG 09/24/2018 NSR LAD  Echo 02/14/2016 Left ventricle: The cavity size was normal. Systolic function was normal. The estimated ejection fraction was in the range of 55% to 60%. Wall motion was normal; there were no regional wall motion abnormalities.  Cardiac Catheterization 10/18/2015 1. Mild non-obstructive CAD 2. Atrial flutter with RVR 3. Cardiomyopathy is non-ischemic, likely rate related.    Neuro/Psych negative neurological ROS  negative psych ROS   GI/Hepatic Neg liver ROS, Hx/o Ca of Tonsillar fossa- S/P RT and ChemoRx Sigmoid diverticulitis   Endo/Other  Hypothyroidism Hyperlipidemia  Renal/GU negative Renal ROS  negative genitourinary   Musculoskeletal negative musculoskeletal ROS (+)   Abdominal   Peds  Hematology Chronic anticoagulation- on Eliquis- last dose   Anesthesia Other Findings   Reproductive/Obstetrics                            Anesthesia Physical Anesthesia Plan  ASA: II  Anesthesia Plan: General   Post-op Pain Management:    Induction: Intravenous  PONV Risk Score and Plan: 3 and Ondansetron, Treatment may vary due to age or medical condition, Dexamethasone and Midazolam  Airway Management Planned: Oral  ETT  Additional Equipment:   Intra-op Plan:   Post-operative Plan: Extubation in OR  Informed Consent: I have reviewed the patients History and Physical, chart, labs and discussed the procedure including the risks, benefits and alternatives for the proposed anesthesia with the patient or authorized representative who has indicated his/her understanding and acceptance.     Dental advisory given  Plan Discussed with: CRNA and Surgeon  Anesthesia Plan Comments: (See PAT note 03/05/2019, Konrad Felix, PA-C)       Anesthesia Quick Evaluation

## 2019-03-07 ENCOUNTER — Inpatient Hospital Stay (HOSPITAL_COMMUNITY): Payer: BC Managed Care – PPO | Admitting: Anesthesiology

## 2019-03-07 ENCOUNTER — Inpatient Hospital Stay (HOSPITAL_COMMUNITY): Payer: BC Managed Care – PPO | Admitting: Physician Assistant

## 2019-03-07 ENCOUNTER — Encounter (HOSPITAL_COMMUNITY)
Admission: RE | Disposition: A | Discharge status: Home / Self Care | Payer: Self-pay | Source: Home / Self Care | Attending: Surgery

## 2019-03-07 ENCOUNTER — Other Ambulatory Visit: Payer: Self-pay

## 2019-03-07 ENCOUNTER — Encounter (HOSPITAL_COMMUNITY): Payer: Self-pay | Admitting: Anesthesiology

## 2019-03-07 ENCOUNTER — Inpatient Hospital Stay (HOSPITAL_COMMUNITY)
Admission: RE | Admit: 2019-03-07 | Discharge: 2019-03-16 | DRG: 332 | Disposition: A | Discharge status: Home / Self Care | Payer: BC Managed Care – PPO | Attending: Surgery | Admitting: Surgery

## 2019-03-07 DIAGNOSIS — D696 Thrombocytopenia, unspecified: Secondary | ICD-10-CM | POA: Diagnosis not present

## 2019-03-07 DIAGNOSIS — I639 Cerebral infarction, unspecified: Secondary | ICD-10-CM

## 2019-03-07 DIAGNOSIS — Y92230 Patient room in hospital as the place of occurrence of the external cause: Secondary | ICD-10-CM | POA: Diagnosis not present

## 2019-03-07 DIAGNOSIS — F17210 Nicotine dependence, cigarettes, uncomplicated: Secondary | ICD-10-CM | POA: Diagnosis present

## 2019-03-07 DIAGNOSIS — I6522 Occlusion and stenosis of left carotid artery: Secondary | ICD-10-CM | POA: Diagnosis present

## 2019-03-07 DIAGNOSIS — I10 Essential (primary) hypertension: Secondary | ICD-10-CM | POA: Diagnosis present

## 2019-03-07 DIAGNOSIS — E785 Hyperlipidemia, unspecified: Secondary | ICD-10-CM | POA: Diagnosis present

## 2019-03-07 DIAGNOSIS — K660 Peritoneal adhesions (postprocedural) (postinfection): Secondary | ICD-10-CM | POA: Diagnosis present

## 2019-03-07 DIAGNOSIS — K5720 Diverticulitis of large intestine with perforation and abscess without bleeding: Principal | ICD-10-CM | POA: Diagnosis present

## 2019-03-07 DIAGNOSIS — K56699 Other intestinal obstruction unspecified as to partial versus complete obstruction: Secondary | ICD-10-CM | POA: Diagnosis present

## 2019-03-07 DIAGNOSIS — Z808 Family history of malignant neoplasm of other organs or systems: Secondary | ICD-10-CM

## 2019-03-07 DIAGNOSIS — I6602 Occlusion and stenosis of left middle cerebral artery: Secondary | ICD-10-CM | POA: Diagnosis not present

## 2019-03-07 DIAGNOSIS — R29702 NIHSS score 2: Secondary | ICD-10-CM | POA: Diagnosis not present

## 2019-03-07 DIAGNOSIS — R29705 NIHSS score 5: Secondary | ICD-10-CM | POA: Diagnosis not present

## 2019-03-07 DIAGNOSIS — E039 Hypothyroidism, unspecified: Secondary | ICD-10-CM | POA: Diagnosis present

## 2019-03-07 DIAGNOSIS — C099 Malignant neoplasm of tonsil, unspecified: Secondary | ICD-10-CM | POA: Diagnosis present

## 2019-03-07 DIAGNOSIS — Z8042 Family history of malignant neoplasm of prostate: Secondary | ICD-10-CM

## 2019-03-07 DIAGNOSIS — Z79890 Hormone replacement therapy (postmenopausal): Secondary | ICD-10-CM

## 2019-03-07 DIAGNOSIS — Y713 Surgical instruments, materials and cardiovascular devices (including sutures) associated with adverse incidents: Secondary | ICD-10-CM | POA: Diagnosis not present

## 2019-03-07 DIAGNOSIS — Y838 Other surgical procedures as the cause of abnormal reaction of the patient, or of later complication, without mention of misadventure at the time of the procedure: Secondary | ICD-10-CM | POA: Diagnosis not present

## 2019-03-07 DIAGNOSIS — J9600 Acute respiratory failure, unspecified whether with hypoxia or hypercapnia: Secondary | ICD-10-CM | POA: Diagnosis not present

## 2019-03-07 DIAGNOSIS — T81718A Complication of other artery following a procedure, not elsewhere classified, initial encounter: Secondary | ICD-10-CM | POA: Diagnosis not present

## 2019-03-07 DIAGNOSIS — I2510 Atherosclerotic heart disease of native coronary artery without angina pectoris: Secondary | ICD-10-CM | POA: Diagnosis present

## 2019-03-07 DIAGNOSIS — R471 Dysarthria and anarthria: Secondary | ICD-10-CM | POA: Diagnosis not present

## 2019-03-07 DIAGNOSIS — I482 Chronic atrial fibrillation, unspecified: Secondary | ICD-10-CM | POA: Diagnosis present

## 2019-03-07 DIAGNOSIS — I724 Aneurysm of artery of lower extremity: Secondary | ICD-10-CM | POA: Diagnosis not present

## 2019-03-07 DIAGNOSIS — R29810 Facial weakness: Secondary | ICD-10-CM | POA: Diagnosis not present

## 2019-03-07 DIAGNOSIS — E876 Hypokalemia: Secondary | ICD-10-CM | POA: Diagnosis not present

## 2019-03-07 DIAGNOSIS — R1310 Dysphagia, unspecified: Secondary | ICD-10-CM | POA: Diagnosis not present

## 2019-03-07 DIAGNOSIS — F17200 Nicotine dependence, unspecified, uncomplicated: Secondary | ICD-10-CM | POA: Diagnosis present

## 2019-03-07 DIAGNOSIS — Z85818 Personal history of malignant neoplasm of other sites of lip, oral cavity, and pharynx: Secondary | ICD-10-CM

## 2019-03-07 DIAGNOSIS — I428 Other cardiomyopathies: Secondary | ICD-10-CM

## 2019-03-07 DIAGNOSIS — I959 Hypotension, unspecified: Secondary | ICD-10-CM | POA: Diagnosis not present

## 2019-03-07 DIAGNOSIS — I63412 Cerebral infarction due to embolism of left middle cerebral artery: Secondary | ICD-10-CM | POA: Diagnosis not present

## 2019-03-07 DIAGNOSIS — Z7901 Long term (current) use of anticoagulants: Secondary | ICD-10-CM

## 2019-03-07 DIAGNOSIS — D649 Anemia, unspecified: Secondary | ICD-10-CM | POA: Diagnosis present

## 2019-03-07 DIAGNOSIS — R4701 Aphasia: Secondary | ICD-10-CM | POA: Diagnosis not present

## 2019-03-07 DIAGNOSIS — Z800 Family history of malignant neoplasm of digestive organs: Secondary | ICD-10-CM

## 2019-03-07 DIAGNOSIS — Z9221 Personal history of antineoplastic chemotherapy: Secondary | ICD-10-CM

## 2019-03-07 DIAGNOSIS — Z923 Personal history of irradiation: Secondary | ICD-10-CM

## 2019-03-07 DIAGNOSIS — Z79899 Other long term (current) drug therapy: Secondary | ICD-10-CM

## 2019-03-07 HISTORY — PX: APPENDECTOMY: SHX54

## 2019-03-07 HISTORY — PX: XI ROBOTIC ASSISTED COLOSTOMY TAKEDOWN: SHX6828

## 2019-03-07 HISTORY — DX: Diverticulitis of large intestine with perforation and abscess without bleeding: K57.20

## 2019-03-07 HISTORY — PX: CYSTOSCOPY WITH STENT PLACEMENT: SHX5790

## 2019-03-07 SURGERY — CLOSURE, COLOSTOMY, ROBOT-ASSISTED
Anesthesia: General | Site: Ureter

## 2019-03-07 MED ORDER — PROCHLORPERAZINE MALEATE 10 MG PO TABS
10.0000 mg | ORAL_TABLET | Freq: Four times a day (QID) | ORAL | Status: DC | PRN
  Filled 2019-03-07: qty 1

## 2019-03-07 MED ORDER — LIDOCAINE HCL (CARDIAC) PF 100 MG/5ML IV SOSY
PREFILLED_SYRINGE | INTRAVENOUS | Status: DC | PRN
  Administered 2019-03-07: 80 mg via INTRAVENOUS

## 2019-03-07 MED ORDER — HYDRALAZINE HCL 20 MG/ML IJ SOLN: 10.0000 mg | INTRAMUSCULAR | Status: DC | PRN

## 2019-03-07 MED ORDER — MAGIC MOUTHWASH
15.0000 mL | Freq: Four times a day (QID) | ORAL | Status: DC | PRN
  Filled 2019-03-07: qty 15

## 2019-03-07 MED ORDER — LIDOCAINE 2% (20 MG/ML) 5 ML SYRINGE
INTRAMUSCULAR | Status: AC
  Filled 2019-03-07: qty 5

## 2019-03-07 MED ORDER — ONDANSETRON HCL 4 MG PO TABS: 4.0000 mg | ORAL_TABLET | Freq: Four times a day (QID) | ORAL | Status: DC | PRN

## 2019-03-07 MED ORDER — NEOMYCIN SULFATE 500 MG PO TABS: 1000.0000 mg | ORAL_TABLET | ORAL | Status: DC

## 2019-03-07 MED ORDER — SODIUM CHLORIDE 0.9 % IV SOLN: Freq: Three times a day (TID) | INTRAVENOUS | Status: AC | PRN

## 2019-03-07 MED ORDER — PROPOFOL 10 MG/ML IV BOLUS
INTRAVENOUS | Status: DC | PRN
  Administered 2019-03-07: 170 mg via INTRAVENOUS

## 2019-03-07 MED ORDER — 0.9 % SODIUM CHLORIDE (POUR BTL) OPTIME
TOPICAL | Status: DC | PRN
  Administered 2019-03-07: 13:00:00 2000 mL

## 2019-03-07 MED ORDER — PROCHLORPERAZINE EDISYLATE 10 MG/2ML IJ SOLN: 5.0000 mg | Freq: Four times a day (QID) | INTRAMUSCULAR | Status: DC | PRN

## 2019-03-07 MED ORDER — INDOCYANINE GREEN 25 MG IV SOLR
INTRAVENOUS | Status: DC | PRN
  Administered 2019-03-07: 6.25 mg via INTRAVENOUS

## 2019-03-07 MED ORDER — SODIUM CHLORIDE 0.9% FLUSH
3.0000 mL | Freq: Two times a day (BID) | INTRAVENOUS | Status: DC
  Administered 2019-03-08 – 2019-03-16 (×11): 3 mL via INTRAVENOUS

## 2019-03-07 MED ORDER — ONDANSETRON HCL 4 MG/2ML IJ SOLN
INTRAMUSCULAR | Status: AC
  Filled 2019-03-07: qty 2

## 2019-03-07 MED ORDER — LIDOCAINE HCL 2 % IJ SOLN
INTRAMUSCULAR | Status: AC
  Filled 2019-03-07: qty 20

## 2019-03-07 MED ORDER — DIPHENHYDRAMINE HCL 50 MG/ML IJ SOLN: 12.5000 mg | Freq: Four times a day (QID) | INTRAMUSCULAR | Status: DC | PRN

## 2019-03-07 MED ORDER — LIP MEDEX EX OINT
1.0000 "application " | TOPICAL_OINTMENT | Freq: Two times a day (BID) | CUTANEOUS | Status: DC
  Administered 2019-03-07 – 2019-03-16 (×10): 1 via TOPICAL
  Filled 2019-03-07: qty 7

## 2019-03-07 MED ORDER — ROCURONIUM BROMIDE 10 MG/ML (PF) SYRINGE
PREFILLED_SYRINGE | INTRAVENOUS | Status: DC | PRN
  Administered 2019-03-07: 20 mg via INTRAVENOUS
  Administered 2019-03-07: 60 mg via INTRAVENOUS
  Administered 2019-03-07: 10 mg via INTRAVENOUS
  Administered 2019-03-07 (×2): 20 mg via INTRAVENOUS

## 2019-03-07 MED ORDER — BUPIVACAINE-EPINEPHRINE (PF) 0.25% -1:200000 IJ SOLN
INTRAMUSCULAR | Status: AC
  Filled 2019-03-07: qty 60

## 2019-03-07 MED ORDER — METHYLENE BLUE 0.5 % INJ SOLN
INTRAVENOUS | Status: DC | PRN
  Administered 2019-03-07: 5 mL

## 2019-03-07 MED ORDER — ACETAMINOPHEN 500 MG PO TABS
1000.0000 mg | ORAL_TABLET | Freq: Four times a day (QID) | ORAL | Status: DC
  Administered 2019-03-07 – 2019-03-10 (×7): 1000 mg via ORAL
  Filled 2019-03-07 (×8): qty 2

## 2019-03-07 MED ORDER — MEPERIDINE HCL 50 MG/ML IJ SOLN: 6.2500 mg | INTRAMUSCULAR | Status: DC | PRN

## 2019-03-07 MED ORDER — SACCHAROMYCES BOULARDII 250 MG PO CAPS
250.0000 mg | ORAL_CAPSULE | Freq: Two times a day (BID) | ORAL | Status: DC
  Administered 2019-03-07 – 2019-03-09 (×4): 250 mg via ORAL
  Filled 2019-03-07 (×5): qty 1

## 2019-03-07 MED ORDER — ONDANSETRON HCL 4 MG/2ML IJ SOLN
INTRAMUSCULAR | Status: DC | PRN
  Administered 2019-03-07: 4 mg via INTRAVENOUS

## 2019-03-07 MED ORDER — STERILE WATER FOR INJECTION IJ SOLN
INTRAMUSCULAR | Status: AC
  Filled 2019-03-07: qty 10

## 2019-03-07 MED ORDER — ONDANSETRON HCL 4 MG/2ML IJ SOLN: 4.0000 mg | Freq: Four times a day (QID) | INTRAMUSCULAR | Status: DC | PRN

## 2019-03-07 MED ORDER — BOOST / RESOURCE BREEZE PO LIQD CUSTOM
1.0000 | Freq: Three times a day (TID) | ORAL | Status: DC
  Administered 2019-03-07 – 2019-03-08 (×2): 1 via ORAL
  Administered 2019-03-09: 237 mL via ORAL

## 2019-03-07 MED ORDER — GABAPENTIN 100 MG PO CAPS
200.0000 mg | ORAL_CAPSULE | Freq: Three times a day (TID) | ORAL | Status: DC
  Administered 2019-03-07 – 2019-03-09 (×7): 200 mg via ORAL
  Filled 2019-03-07 (×6): qty 2

## 2019-03-07 MED ORDER — GABAPENTIN 300 MG PO CAPS
300.0000 mg | ORAL_CAPSULE | ORAL | Status: DC
  Filled 2019-03-07: qty 1

## 2019-03-07 MED ORDER — ROCURONIUM BROMIDE 10 MG/ML (PF) SYRINGE
PREFILLED_SYRINGE | INTRAVENOUS | Status: AC
  Filled 2019-03-07: qty 10

## 2019-03-07 MED ORDER — LIDOCAINE 20MG/ML (2%) 15 ML SYRINGE OPTIME
INTRAMUSCULAR | Status: DC | PRN
  Administered 2019-03-07: 1.5 mg/kg/h via INTRAVENOUS

## 2019-03-07 MED ORDER — EPHEDRINE SULFATE 50 MG/ML IJ SOLN
INTRAMUSCULAR | Status: DC | PRN
  Administered 2019-03-07 (×2): 10 mg via INTRAVENOUS
  Administered 2019-03-07 (×2): 5 mg via INTRAVENOUS

## 2019-03-07 MED ORDER — LACTATED RINGERS IR SOLN
Status: DC | PRN
  Administered 2019-03-07: 1000 mL

## 2019-03-07 MED ORDER — ACETAMINOPHEN 325 MG PO TABS: 650.0000 mg | ORAL_TABLET | Freq: Four times a day (QID) | ORAL | Status: DC | PRN

## 2019-03-07 MED ORDER — MIDAZOLAM HCL 2 MG/2ML IJ SOLN
INTRAMUSCULAR | Status: AC
  Filled 2019-03-07: qty 2

## 2019-03-07 MED ORDER — FENTANYL CITRATE (PF) 250 MCG/5ML IJ SOLN
INTRAMUSCULAR | Status: AC
  Filled 2019-03-07: qty 5

## 2019-03-07 MED ORDER — HYDROMORPHONE HCL 1 MG/ML IJ SOLN: 0.5000 mg | INTRAMUSCULAR | Status: DC | PRN

## 2019-03-07 MED ORDER — CELECOXIB 200 MG PO CAPS
200.0000 mg | ORAL_CAPSULE | ORAL | Status: AC
  Administered 2019-03-07: 11:00:00 200 mg via ORAL
  Filled 2019-03-07: qty 1

## 2019-03-07 MED ORDER — LEVOTHYROXINE SODIUM 25 MCG PO TABS
125.0000 ug | ORAL_TABLET | Freq: Every day | ORAL | Status: DC
  Administered 2019-03-08 – 2019-03-10 (×2): 125 ug via ORAL
  Filled 2019-03-07 (×2): qty 1

## 2019-03-07 MED ORDER — SODIUM CHLORIDE 0.9 % IV SOLN
2.0000 g | Freq: Two times a day (BID) | INTRAVENOUS | Status: AC
  Administered 2019-03-07: 2 g via INTRAVENOUS
  Filled 2019-03-07: qty 2

## 2019-03-07 MED ORDER — MIDAZOLAM HCL 5 MG/5ML IJ SOLN
INTRAMUSCULAR | Status: DC | PRN
  Administered 2019-03-07: 2 mg via INTRAVENOUS

## 2019-03-07 MED ORDER — ACETAMINOPHEN 500 MG PO TABS
1000.0000 mg | ORAL_TABLET | ORAL | Status: AC
  Administered 2019-03-07: 1000 mg via ORAL
  Filled 2019-03-07: qty 2

## 2019-03-07 MED ORDER — STERILE WATER FOR IRRIGATION IR SOLN
Status: DC | PRN
  Administered 2019-03-07: 250 mL via INTRAVESICAL

## 2019-03-07 MED ORDER — FENTANYL CITRATE (PF) 250 MCG/5ML IJ SOLN
INTRAMUSCULAR | Status: DC | PRN
  Administered 2019-03-07: 50 ug via INTRAVENOUS
  Administered 2019-03-07: 100 ug via INTRAVENOUS

## 2019-03-07 MED ORDER — METHYLENE BLUE 0.5 % INJ SOLN
INTRAVENOUS | Status: AC
  Filled 2019-03-07: qty 10

## 2019-03-07 MED ORDER — SODIUM CHLORIDE 0.9 % IV SOLN: 250.0000 mL | INTRAVENOUS | Status: DC | PRN

## 2019-03-07 MED ORDER — SCOPOLAMINE 1 MG/3DAYS TD PT72
1.0000 | MEDICATED_PATCH | TRANSDERMAL | Status: DC
  Administered 2019-03-07: 1.5 mg via TRANSDERMAL
  Filled 2019-03-07: qty 1

## 2019-03-07 MED ORDER — PROPOFOL 10 MG/ML IV BOLUS
INTRAVENOUS | Status: AC
  Filled 2019-03-07: qty 20

## 2019-03-07 MED ORDER — 0.9 % SODIUM CHLORIDE (POUR BTL) OPTIME
TOPICAL | Status: DC | PRN
  Administered 2019-03-07: 1000 mL

## 2019-03-07 MED ORDER — ONDANSETRON HCL 4 MG/2ML IJ SOLN: 4.0000 mg | Freq: Once | INTRAMUSCULAR | Status: DC | PRN

## 2019-03-07 MED ORDER — HYDROMORPHONE HCL 1 MG/ML IJ SOLN: 0.2500 mg | INTRAMUSCULAR | Status: DC | PRN

## 2019-03-07 MED ORDER — ENSURE SURGERY PO LIQD
237.0000 mL | Freq: Two times a day (BID) | ORAL | Status: DC
  Administered 2019-03-08 – 2019-03-09 (×2): 237 mL via ORAL
  Filled 2019-03-07 (×6): qty 237

## 2019-03-07 MED ORDER — SODIUM CHLORIDE 0.9 % IR SOLN
Status: DC | PRN
  Administered 2019-03-07: 1000 mL

## 2019-03-07 MED ORDER — DEXAMETHASONE SODIUM PHOSPHATE 10 MG/ML IJ SOLN
INTRAMUSCULAR | Status: AC
  Filled 2019-03-07: qty 1

## 2019-03-07 MED ORDER — METRONIDAZOLE 500 MG PO TABS: 1000.0000 mg | ORAL_TABLET | ORAL | Status: DC

## 2019-03-07 MED ORDER — BUPIVACAINE-EPINEPHRINE (PF) 0.25% -1:200000 IJ SOLN
INTRAMUSCULAR | Status: DC | PRN
  Administered 2019-03-07: 60 mL

## 2019-03-07 MED ORDER — DIPHENHYDRAMINE HCL 12.5 MG/5ML PO ELIX: 12.5000 mg | ORAL_SOLUTION | Freq: Four times a day (QID) | ORAL | Status: DC | PRN

## 2019-03-07 MED ORDER — SUGAMMADEX SODIUM 200 MG/2ML IV SOLN
INTRAVENOUS | Status: DC | PRN
  Administered 2019-03-07: 125 mg via INTRAVENOUS

## 2019-03-07 MED ORDER — BUPIVACAINE LIPOSOME 1.3 % IJ SUSP
INTRAMUSCULAR | Status: DC | PRN
  Administered 2019-03-07: 20 mL

## 2019-03-07 MED ORDER — SODIUM CHLORIDE (PF) 0.9 % IJ SOLN
INTRAMUSCULAR | Status: AC
  Filled 2019-03-07: qty 10

## 2019-03-07 MED ORDER — ALVIMOPAN 12 MG PO CAPS
12.0000 mg | ORAL_CAPSULE | ORAL | Status: AC
  Administered 2019-03-07: 12 mg via ORAL
  Filled 2019-03-07: qty 1

## 2019-03-07 MED ORDER — ALUM & MAG HYDROXIDE-SIMETH 200-200-20 MG/5ML PO SUSP: 30.0000 mL | Freq: Four times a day (QID) | ORAL | Status: DC | PRN

## 2019-03-07 MED ORDER — NEBIVOLOL HCL 10 MG PO TABS
10.0000 mg | ORAL_TABLET | Freq: Every day | ORAL | Status: DC
  Administered 2019-03-08 – 2019-03-09 (×2): 10 mg via ORAL
  Filled 2019-03-07 (×3): qty 1

## 2019-03-07 MED ORDER — ATORVASTATIN CALCIUM 40 MG PO TABS
40.0000 mg | ORAL_TABLET | Freq: Every evening | ORAL | Status: DC
  Administered 2019-03-07 – 2019-03-09 (×3): 40 mg via ORAL
  Filled 2019-03-07 (×3): qty 1

## 2019-03-07 MED ORDER — BISACODYL 5 MG PO TBEC
20.0000 mg | DELAYED_RELEASE_TABLET | Freq: Once | ORAL | Status: DC
  Filled 2019-03-07: qty 4

## 2019-03-07 MED ORDER — TRAMADOL HCL 50 MG PO TABS
50.0000 mg | ORAL_TABLET | Freq: Four times a day (QID) | ORAL | Status: DC | PRN
  Administered 2019-03-07: 50 mg via ORAL
  Filled 2019-03-07: qty 1
  Filled 2019-03-07: qty 2
  Filled 2019-03-07: qty 1

## 2019-03-07 MED ORDER — DEXAMETHASONE SODIUM PHOSPHATE 10 MG/ML IJ SOLN
INTRAMUSCULAR | Status: DC | PRN
  Administered 2019-03-07: 10 mg via INTRAVENOUS

## 2019-03-07 MED ORDER — TRAMADOL HCL 50 MG PO TABS: 50.0000 mg | ORAL_TABLET | Freq: Four times a day (QID) | ORAL | 0 refills | Status: DC | PRN

## 2019-03-07 MED ORDER — SODIUM CHLORIDE 0.9 % IV SOLN
INTRAVENOUS | Status: DC | PRN
  Administered 2019-03-07: 1000 mL

## 2019-03-07 MED ORDER — KETAMINE HCL 10 MG/ML IJ SOLN
INTRAMUSCULAR | Status: DC | PRN
  Administered 2019-03-07: 30 mg via INTRAVENOUS

## 2019-03-07 MED ORDER — METOPROLOL TARTRATE 5 MG/5ML IV SOLN: 5.0000 mg | Freq: Four times a day (QID) | INTRAVENOUS | Status: DC | PRN

## 2019-03-07 MED ORDER — POLYETHYLENE GLYCOL 3350 17 GM/SCOOP PO POWD: 1.0000 | Freq: Once | ORAL | Status: DC

## 2019-03-07 MED ORDER — LACTATED RINGERS IV SOLN
INTRAVENOUS | Status: AC
  Administered 2019-03-07: 18:00:00 via INTRAVENOUS

## 2019-03-07 MED ORDER — SODIUM CHLORIDE 0.9% FLUSH
3.0000 mL | INTRAVENOUS | Status: DC | PRN
  Administered 2019-03-11: 3 mL via INTRAVENOUS
  Filled 2019-03-07: qty 3

## 2019-03-07 MED ORDER — SODIUM CHLORIDE 0.9 % IV SOLN
2.0000 g | INTRAVENOUS | Status: AC
  Administered 2019-03-07: 2 g via INTRAVENOUS
  Filled 2019-03-07: qty 2

## 2019-03-07 MED ORDER — ENOXAPARIN SODIUM 40 MG/0.4ML ~~LOC~~ SOLN
40.0000 mg | SUBCUTANEOUS | Status: DC
  Administered 2019-03-08 – 2019-03-09 (×2): 40 mg via SUBCUTANEOUS
  Filled 2019-03-07 (×2): qty 0.4

## 2019-03-07 MED ORDER — ALVIMOPAN 12 MG PO CAPS
12.0000 mg | ORAL_CAPSULE | Freq: Two times a day (BID) | ORAL | Status: DC
  Administered 2019-03-08 – 2019-03-09 (×2): 12 mg via ORAL
  Filled 2019-03-07 (×6): qty 1

## 2019-03-07 MED ORDER — ENOXAPARIN SODIUM 40 MG/0.4ML ~~LOC~~ SOLN
40.0000 mg | Freq: Once | SUBCUTANEOUS | Status: DC
  Filled 2019-03-07: qty 0.4

## 2019-03-07 MED ORDER — LACTATED RINGERS IV SOLN
INTRAVENOUS | Status: DC
  Administered 2019-03-07 (×2): via INTRAVENOUS

## 2019-03-07 MED ORDER — KETAMINE HCL 10 MG/ML IJ SOLN
INTRAMUSCULAR | Status: AC
  Filled 2019-03-07: qty 1

## 2019-03-07 SURGICAL SUPPLY — 115 items
APL PRP STRL LF DISP 70% ISPRP (MISCELLANEOUS) ×2
APPLIER CLIP 5 13 M/L LIGAMAX5 (MISCELLANEOUS)
APPLIER CLIP ROT 10 11.4 M/L (STAPLE)
APR CLP MED LRG 11.4X10 (STAPLE)
APR CLP MED LRG 5 ANG JAW (MISCELLANEOUS)
BAG URO CATCHER STRL LF (MISCELLANEOUS) ×4 IMPLANT
BLADE EXTENDED COATED 6.5IN (ELECTRODE) ×4 IMPLANT
CANNULA REDUC XI 12-8 STAPL (CANNULA) ×1
CANNULA REDUC XI 12-8MM STAPL (CANNULA) ×1
CANNULA REDUCER 12-8 DVNC XI (CANNULA) ×2 IMPLANT
CATH URET 5FR 28IN OPEN ENDED (CATHETERS) ×4 IMPLANT
CELLS DAT CNTRL 66122 CELL SVR (MISCELLANEOUS) IMPLANT
CHLORAPREP W/TINT 26 (MISCELLANEOUS) ×4 IMPLANT
CLIP APPLIE 5 13 M/L LIGAMAX5 (MISCELLANEOUS) IMPLANT
CLIP APPLIE ROT 10 11.4 M/L (STAPLE) IMPLANT
CLIP VESOLOCK LG 6/CT PURPLE (CLIP) IMPLANT
CLIP VESOLOCK MED LG 6/CT (CLIP) IMPLANT
CLOTH BEACON ORANGE TIMEOUT ST (SAFETY) ×4 IMPLANT
COVER SURGICAL LIGHT HANDLE (MISCELLANEOUS) ×8 IMPLANT
COVER TIP SHEARS 8 DVNC (MISCELLANEOUS) ×2 IMPLANT
COVER TIP SHEARS 8MM DA VINCI (MISCELLANEOUS) ×2
COVER WAND RF STERILE (DRAPES) IMPLANT
DECANTER SPIKE VIAL GLASS SM (MISCELLANEOUS) ×4 IMPLANT
DEVICE TROCAR PUNCTURE CLOSURE (ENDOMECHANICALS) IMPLANT
DRAIN CHANNEL 19F RND (DRAIN) ×4 IMPLANT
DRAPE ARM DVNC X/XI (DISPOSABLE) ×8 IMPLANT
DRAPE COLUMN DVNC XI (DISPOSABLE) ×2 IMPLANT
DRAPE DA VINCI XI ARM (DISPOSABLE) ×8
DRAPE DA VINCI XI COLUMN (DISPOSABLE) ×2
DRAPE SURG IRRIG POUCH 19X23 (DRAPES) ×4 IMPLANT
DRSG OPSITE POSTOP 4X10 (GAUZE/BANDAGES/DRESSINGS) IMPLANT
DRSG OPSITE POSTOP 4X6 (GAUZE/BANDAGES/DRESSINGS) ×2 IMPLANT
DRSG OPSITE POSTOP 4X8 (GAUZE/BANDAGES/DRESSINGS) IMPLANT
DRSG TEGADERM 2-3/8X2-3/4 SM (GAUZE/BANDAGES/DRESSINGS) ×12 IMPLANT
DRSG TEGADERM 4X4.75 (GAUZE/BANDAGES/DRESSINGS) ×4 IMPLANT
ELECT PENCIL ROCKER SW 15FT (MISCELLANEOUS) ×4 IMPLANT
ELECT REM PT RETURN 15FT ADLT (MISCELLANEOUS) ×4 IMPLANT
ENDOLOOP SUT PDS II  0 18 (SUTURE)
ENDOLOOP SUT PDS II 0 18 (SUTURE) IMPLANT
EVACUATOR SILICONE 100CC (DRAIN) ×4 IMPLANT
GAUZE SPONGE 2X2 8PLY STRL LF (GAUZE/BANDAGES/DRESSINGS) ×2 IMPLANT
GAUZE SPONGE 4X4 12PLY STRL (GAUZE/BANDAGES/DRESSINGS) IMPLANT
GLOVE BIOGEL M STRL SZ7.5 (GLOVE) ×4 IMPLANT
GLOVE ECLIPSE 8.0 STRL XLNG CF (GLOVE) ×20 IMPLANT
GLOVE INDICATOR 8.0 STRL GRN (GLOVE) ×20 IMPLANT
GOWN STRL REUS W/TWL LRG LVL3 (GOWN DISPOSABLE) ×8 IMPLANT
GOWN STRL REUS W/TWL XL LVL3 (GOWN DISPOSABLE) ×20 IMPLANT
GRASPER SUT TROCAR 14GX15 (MISCELLANEOUS) ×4 IMPLANT
GUIDEWIRE STR DUAL SENSOR (WIRE) ×4 IMPLANT
HOLDER FOLEY CATH W/STRAP (MISCELLANEOUS) ×4 IMPLANT
IRRIG SUCT STRYKERFLOW 2 WTIP (MISCELLANEOUS)
IRRIGATION SUCT STRKRFLW 2 WTP (MISCELLANEOUS) IMPLANT
KIT PROCEDURE DA VINCI SI (MISCELLANEOUS)
KIT PROCEDURE DVNC SI (MISCELLANEOUS) IMPLANT
KIT TURNOVER KIT A (KITS) IMPLANT
MANIFOLD NEPTUNE II (INSTRUMENTS) ×4 IMPLANT
NDL INSUFFLATION 14GA 120MM (NEEDLE) ×2 IMPLANT
NEEDLE INSUFFLATION 14GA 120MM (NEEDLE) ×4 IMPLANT
PACK CARDIOVASCULAR III (CUSTOM PROCEDURE TRAY) ×4 IMPLANT
PACK COLON (CUSTOM PROCEDURE TRAY) ×4 IMPLANT
PACK CYSTO (CUSTOM PROCEDURE TRAY) ×4 IMPLANT
PAD POSITIONING PINK XL (MISCELLANEOUS) ×4 IMPLANT
PORT LAP GEL ALEXIS MED 5-9CM (MISCELLANEOUS) ×4 IMPLANT
PROTECTOR NERVE ULNAR (MISCELLANEOUS) ×8 IMPLANT
RELOAD STAPLE 45 BLU REG DVNC (STAPLE) IMPLANT
RELOAD STAPLE 45 GRN THCK DVNC (STAPLE) IMPLANT
RETRACTOR WND ALEXIS 18 MED (MISCELLANEOUS) IMPLANT
RTRCTR WOUND ALEXIS 18CM MED (MISCELLANEOUS)
SCISSORS LAP 5X35 DISP (ENDOMECHANICALS) ×4 IMPLANT
SEAL CANN UNIV 5-8 DVNC XI (MISCELLANEOUS) ×8 IMPLANT
SEAL XI 5MM-8MM UNIVERSAL (MISCELLANEOUS) ×8
SEALER VESSEL DA VINCI XI (MISCELLANEOUS) ×2
SEALER VESSEL EXT DVNC XI (MISCELLANEOUS) ×2 IMPLANT
SLEEVE ADV FIXATION 5X100MM (TROCAR) IMPLANT
SOLUTION ELECTROLUBE (MISCELLANEOUS) ×4 IMPLANT
SPONGE GAUZE 2X2 STER 10/PKG (GAUZE/BANDAGES/DRESSINGS) ×2
STAPLER 45 BLU RELOAD XI (STAPLE) ×2 IMPLANT
STAPLER 45 BLUE RELOAD XI (STAPLE) ×2
STAPLER 45 GREEN RELOAD XI (STAPLE) ×2
STAPLER 45 GRN RELOAD XI (STAPLE) ×2 IMPLANT
STAPLER CANNULA SEAL DVNC XI (STAPLE) ×2 IMPLANT
STAPLER CANNULA SEAL XI (STAPLE) ×2
STAPLER ECHELON POWER CIR 29 (STAPLE) ×2 IMPLANT
STAPLER SHEATH (SHEATH) ×2
STAPLER SHEATH ENDOWRIST DVNC (SHEATH) ×2 IMPLANT
SURGILUBE 2OZ TUBE FLIPTOP (MISCELLANEOUS) ×4 IMPLANT
SUT MNCRL AB 4-0 PS2 18 (SUTURE) ×4 IMPLANT
SUT PDS AB 1 CT1 27 (SUTURE) ×8 IMPLANT
SUT PDS AB 1 TP1 96 (SUTURE) IMPLANT
SUT PROLENE 0 CT 2 (SUTURE) IMPLANT
SUT PROLENE 2 0 KS (SUTURE) IMPLANT
SUT PROLENE 2 0 SH DA (SUTURE) IMPLANT
SUT SILK 2 0 (SUTURE)
SUT SILK 2 0 SH CR/8 (SUTURE) ×2 IMPLANT
SUT SILK 2-0 18XBRD TIE 12 (SUTURE) IMPLANT
SUT SILK 3 0 (SUTURE) ×4
SUT SILK 3 0 SH CR/8 (SUTURE) ×4 IMPLANT
SUT SILK 3-0 18XBRD TIE 12 (SUTURE) ×2 IMPLANT
SUT V-LOC BARB 180 2/0GR6 GS22 (SUTURE)
SUT VIC AB 3-0 SH 18 (SUTURE) IMPLANT
SUT VIC AB 3-0 SH 27 (SUTURE)
SUT VIC AB 3-0 SH 27XBRD (SUTURE) IMPLANT
SUT VICRYL 0 UR6 27IN ABS (SUTURE) ×4 IMPLANT
SUTURE V-LC BRB 180 2/0GR6GS22 (SUTURE) IMPLANT
SYR 10ML ECCENTRIC (SYRINGE) ×4 IMPLANT
SYS LAPSCP GELPORT 120MM (MISCELLANEOUS)
SYSTEM LAPSCP GELPORT 120MM (MISCELLANEOUS) IMPLANT
TAPE UMBILICAL COTTON 1/8X30 (MISCELLANEOUS) ×4 IMPLANT
TOWEL OR NON WOVEN STRL DISP B (DISPOSABLE) ×4 IMPLANT
TRAY FOLEY MTR SLVR 16FR STAT (SET/KITS/TRAYS/PACK) ×4 IMPLANT
TROCAR ADV FIXATION 5X100MM (TROCAR) ×4 IMPLANT
TUBING CONNECTING 10 (TUBING) ×6 IMPLANT
TUBING CONNECTING 10' (TUBING) ×2
TUBING INSUFFLATION 10FT LAP (TUBING) ×4 IMPLANT
TUBING UROLOGY SET (TUBING) IMPLANT

## 2019-03-07 NOTE — Op Note (Signed)
03/07/2019  2:47 PM  PATIENT:  Adrian [name]  63 y.o. male  Patient Care Team: [name], MD as PCP - General (Family Medicine) Sueanne Margarita, MD as PCP - Cardiology (Cardiology) Sueanne Margarita, MD as Consulting Physician (Cardiology) Michael Boston, MD as Consulting Physician (General Surgery) Irene Shipper, MD as Consulting Physician (Gastroenterology) Izora Gala, MD as Consulting Physician (Otolaryngology)  PRE-OPERATIVE DIAGNOSIS:  SIGMOID DIVERTICULITIS WITH HISTORY OF ABSCESS  POST-OPERATIVE DIAGNOSIS:  SIGMOID DIVERTICULITIS WITH HISTORY OF ABSCESS  PROCEDURE:   XI ROBOTIC ASSISTED LOW ANTERIOR RESECTION RIGID PROCTOSCOPY ASSESSMENT OF TISSUE PERFUSION BY FIREFLY IMMUNOFLUORESCNECE ROBOTIC ASSISTED APPENDECTOMY  SURGEON:  [name], MD, FACS, MASCRS  ASSISTANT: Leighton Ruff, MD, FACS, FARCRS An experienced assistant was required given the standard of surgical care given the complexity of the case.  This assistant was needed for exposure, dissection, suctioning, retraction, instrument exchange, etc.  ANESTHESIA:   local and general  Nerve block provided with liposomal bupivacaine (Experel) mixed with 0.25% bupivacaine as a Bilateral TAP block x 34mL each side at the level of the transverse abdominis & preperitoneal spaces along the flank at the anterior axillary line, from subcostal ridge to iliac crest under laparoscopic guidance    EBL:  Total I/O In: 1000 [I.V.:1000] Out: 175 [Urine:150; Blood:25]  Delay start of Pharmacological VTE agent (>24hrs) due to surgical blood loss or risk of bleeding:  no  DRAINS: No  SPECIMENS:   RECTOSIGMOID COLON APPENDIX DISTAL ANASTOMOTIC RING  DISPOSITION OF SPECIMEN:  PATHOLOGY  COUNTS:  YES  PLAN OF CARE: Admit to inpatient   PATIENT DISPOSITION:  PACU - hemodynamically stable.  INDICATION:    Smoking male chronically anticoagulated for atrial fibrillation.  Developed severe diverticulitis with  abscess on bladder.  Percutaneous drainage and IV antibiotics.  Improved.  No definite evidence of colovesical fistula.  I recommended segmental resection:  The anatomy & physiology of the digestive tract was discussed.  The pathophysiology was discussed.  Natural history risks without surgery was discussed.   I worked to give an overview of the disease and the frequent need to have multispecialty involvement.  I feel the risks of no intervention will lead to serious problems that outweigh the operative risks; therefore, I recommended a partial colectomy to remove the pathology.  Laparoscopic & open techniques were discussed.   Risks such as bleeding, infection, abscess, leak, reoperation, possible ostomy, hernia, heart attack, death, and other risks were discussed.  I noted a good likelihood this will help address the problem.   Goals of post-operative recovery were discussed as well.  We will work to minimize complications.  Educational materials on the pathology had been given in the office.  Questions were answered.    The patient expressed understanding & wished to proceed with surgery.  OR FINDINGS:   Patient had very inflamed rectosigmoid colon densely adherent to the base of the dome with a bladder on the left side.  No evidence of any colovesical fistula.  Appendix densely adherent to the region as well so therefore taken as well  No obvious metastatic disease on visceral parietal peritoneum or liver.  The anastomosis rests 11-12 cm from the anal verge by rigid proctoscopy.  DESCRIPTION:   Informed consent was confirmed.  The patient underwent general anaesthesia without difficulty.  The patient was positioned appropriately.  VTE prevention in place.  The patient was clipped, prepped, & draped in a sterile fashion.  Surgical timeout confirmed our plan.  Patient underwent  cystoscopy with firefly injection of both ureters by Dr. Louis Meckel for visualization given the large prior abscess on his  bladder and along his ureter.  Please see his separate operative report  The patient was positioned in reverse Trendelenburg.  Abdominal entry was gained using Varess technique at the right subcostal ridge on the anterior abdominal wall.  No elevated EtCO2 noted.  Port placed.  Camera inspection revealed no injury.  Extra ports were carefully placed under direct laparoscopic visualization.  I reflected the greater omentum and the upper abdomen the small bowel in the upper abdomen.  The patient was carefully positioned.  The Intuitive daVinci robot was docked with camera & instruments carefully placed.  The patient had inflamed phlegmon rectosigmoid colon densely adherent to the anterior pelvis and left inguinal region.  I scored the base of peritoneum of the medial side of the mesentery of the elevated left colon from the ligament of Treitz to the peritoneal reflection of the mid rectum.   I elevated the sigmoid mesentery and entered into the retro-mesenteric plane.  Had elevate further to get in deeper since his left colon mesentery was rather contracted.  We were able to identify the left ureter and gonadal vessels. We kept those posterior within the retroperitoneum and elevated the left colon mesentery off that. I did isolate the inferior mesenteric artery (IMA) pedicle but did not ligate it yet.  I continued distally and got into the avascular plane posterior to the mesorectum. This allowed me to help mobilize the rectum as well by freeing the mesorectum off the sacrum.  I mobilized the peritoneal coverings towards the peritoneal reflection on the right side of the rectum.  I stayed away from the right and left ureters.  I kept the lateral vascular pedicles to the rectum intact.  I skeletonized the lymph nodes off the inferior mesenteric artery pedicle.  I went down to its takeoff from the aorta.  I isolated the inferior mesenteric vein off of the ligament of Treitz just cephalad to that as well.  After  confirming the left ureter was out of the way, I went ahead and ligated the inferior mesenteric artery pedicle just near its takeoff from the aorta.  I did ligate the inferior mesenteric vein in a similar fashion.  I were to help elevate the left colon mesentery off the retroperitoneum including the kidney up towards the inferior pancreatic ridge.  I held off on doing splenic flexure mobilization.  I then mobilized the colon in a lateral medial fashion.  I now focused on the obviously inflamed phlegmon.  The appendix tip was densely adherent to this region.  Rather inflamed and stuck to it.  Did not feel it could be saved.  I did transection the appendix more distally to allow better mobility and visualization.  I did transect the mesoappendix at the appendiceal base.  Transition over to cautery scissors and gradually freed the rectosigmoid colon off its dense adhesions to the bladder and pelvic sidewall.  We mobilized the left ureter off it and could safely freed off the pelvis until we had mobilized and untwisted a segment of sigmoid colon that had come down and adhered to the mid rectum closer to the peritoneal reflection.  With that we could untwist things further.  Mobilized in the presacral plane to help straighten out the rectum.  We ensured hemostasis. I skeletonized the mesorectum at the junction at the proximal rectum for the distal point of resection.  I mobilized the left colon  in a lateral to medial fashion off the line of Toldt up towards the splenic flexure to ensure good mobilization of the remaining left colon to reach into the pelvis.   I chose a region at the descending/sigmoid junction that was soft and easily reached down to the rectal stump.  I transected the mesentery of the colon radially to preserve remaining colon blood supply.  We asked anesthesia to dilute the indocyanine green (ICG) to 10 mL and inject 3 mL intravenously with IV flush.  I switched to the NIR fluorescence (Firefly  mode) imaging window on the daVinci platform.  I was able to see good light green visualization of blood vessels with good perfusion of tissues, confirming good tissue perfusion of both ends planned for anastomosis.  I skeletonized at the proximal mesorectum and transected at the proximal rectum using a robotic 45 mm stapler.  90% across with the first firing.  One more firing on the left lateral aspect.  I then used a separate blue load stapler to staple the long appendix off the cecum with a single firing.  We had the circulator fill the bladder with methylene blue diluted isotonic solution gradually with a gravity bag..  We got excellent distention of the bladder up to 400 mL and saw no leak or any evidence of any colovesical fistula, cystotomy or any other issues.  Ureters looked stable and there retroperitoneal position.  Foley placed back to gravity.  I created an extraction incision through a small Pfannenstiel incision in the suprapubic region.  Placed a wound protector.  I was able to eviscerate the appendix out.  We then were able to bring out rectosigmoid and descending colon out the wound.   I clamped the colon proximal to this area using a reusable pursestringer device.  Passed a 2-0 Keith needle. I transected at the descending/sigmoid junction with a scalpel. I got healthy bleeding mucosa.  We sent the rectosigmoid colon specimen off to go to pathology.  We sized the colon orifice.  I chose a 33 EEA anvil stapler system.  I reinforced the prolene pursestring with interrupted silk suture.  I placed the anvil to the open end of the proximal remaining colon and closed around it using the pursestring.    We did copious irrigation with crystalloid solution.  Hemostasis was good.  The distal end of the remaining colon easily reached down to the rectal stump, therefore, splenic flexure mobilization was not needed.      Dr Marcello Moores scrubbed down and did gentle anal dilation and advanced the EEA stapler up  the rectal stump. The spike was brought out at the provimal end of the rectal stump under direct visualization.  I attached the anvil of the proximal colon the spike of the stapler. Anvil was tightened down and held clamped for 60 seconds. The EEA stapler was fired and held clamped for 30 seconds. The stapler was released & removed. We noted 2 excellent anastomotic rings. Blue stitch is in the proximal ring.  Dr Marcello Moores did rigid proctoscopy noted the anastomosis was at 11-12 cm from the anal verge consistent with the mid/proximal rectal junction.  We did a final irrigation of antibiotic solution (900 mg clindamycin/240 mg gentamicin in a liter of crystalloid) & held that for the pelvic air leak test .  The rectum was insufflated the rectum while clamping the colon proximal to that anastomosis.  There was a negative air leak test. There was no tension of mesentery or bowel at the  anastomosis.   Tissues looked viable.  Ureters & bowel uninjured.  The anastomosis looked healthy.  Endoluminal gas was evacuated.  Ports & wound protector removed.  We changed gloves & redraped the patient per colon SSI prevention protocol.  We aspirated the antibiotic irrigation.  Hemostasis was good.  Sterile unused instruments were used from this point.  I closed the skin at the port sites using Monocryl stitch and sterile dressing.  I closed the extraction wound using a 0 Vicryl vertical peritoneal closure and a #1 PDS transverse anterior rectal fascial closure like a small Pfannenstiel closure. I closed the skin with some interrupted Monocryl stitches. I placed sterile dressings.     Patient is being extubated go to recovery room. I had discussed postop care with the patient in detail the office & in the holding area. Instructions are written. I discussed operative findings, updated the patient's status, discussed probable steps to recovery, and gave postoperative recommendations to the patient's spouse.  Recommendations were made.   Questions were answered.  She expressed understanding & appreciation.   [name], M.D., F.A.C.S. Gastrointestinal and Minimally Invasive Surgery [facility] [address] [place], [place] [address] [phone] Main / Paging

## 2019-03-07 NOTE — H&P (Signed)
Adrian [name] DOB: [birth year] Married / Language: English / Race: White Male  Patient Care Team: [name], MD as PCP - General (Family Medicine) Sueanne Margarita, MD as PCP - Cardiology (Cardiology) Sueanne Margarita, MD as Consulting Physician (Cardiology) Michael Boston, MD as Consulting Physician (General Surgery) Irene Shipper, MD as Consulting Physician (Gastroenterology) Izora Gala, MD as Consulting Physician (Otolaryngology)  ` Patient returns after treatment for diverticulitis with abscess  Chief Complaint: ` ` Pleasant patient that was found to have diverticulitis and was admitted in February. Had small abscess that seemed to improve with antibiotics. Was sent home on Augmentin. Came back feeling worse and was readmitted in April.. Underwent percutaneous drainage the abscess, another course of IV antibiotics. He improved. Sent home with drain 4/29. He's completed or antibiotics. He had a follow-up drain study which showed no fistula nor abscess was removed on 5/13. He comes in today to discuss surgery given the complex diverticulitis attack. He has a history of atrial fibrillation with some cardiomyopathy. He is on Eliquis. Follow by Dr. [name] with cardiology. 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 Had a G-tube placed to the time. Not needing any more. No recurrence.  Drain removed 12/18/2018  He notes he's feeling better but not back to normal. He had a follow-up CAT scan did show an area of inflammation. Placed back on antibiotics. Nearly done with the second batch. Denies any abdominal pain. Having some occasional urgency and loose bowel movements but nothing too bad. Appetite returning. Energy level not back to normal but improving overall. Denies any urinary frequency or pneumaturia. Feeling much better since the drainage and antibiotics. No bladder issues that he knows of. He works at [employer] doing a lot of  heavy intense lifting.   Colonoscopy yesterday noted diverticular stricture.  (Review of systems as stated in this history (HPI) or in the review of systems. Otherwise all other 12 point ROS are negative) ` ` `   Diagnostic Studies History Nance Pew, CMA; 12/23/2018 11:00 AM) Colonoscopy 5-10 years ago  Allergies Nance Pew, CMA; 12/23/2018 11:00 AM) No Known Allergies [12/23/2018]: No Known Drug Allergies [12/23/2018]: Allergies Reconciled  Medication History Nance Pew, CMA; 12/23/2018 11:01 AM) Amoxicillin-Pot Clavulanate (875-125MG  Tablet, 1 (one) Oral two times daily, Taken starting 12/18/2018) Active. Bystolic (10MG  Tablet, Oral) Active. Levothyroxine Sodium (125MCG Tablet, Oral) Active. Eliquis (2.5MG  Tablet, Oral) Active. Medications Reconciled     Review of Systems ([place]; 12/23/2018 11:00 AM) General Present- Fatigue and Weight Loss. Not Present- Appetite Loss, Chills, Fever, Night Sweats and Weight Gain. Respiratory Not Present- Bloody sputum, Chronic Cough, Difficulty Breathing, Snoring and Wheezing.  Vitals (Sabrina Canty CMA; 12/23/2018 11:01 AM) 12/23/2018 11:01 AM Weight: 130.4 lb Height: 68in Body Surface Area: 1.7 m Body Mass Index: 19.83 kg/m  Temp.: 98.4F(Oral)  Pulse: 99 (Regular)  BP: 102/68 (Sitting, Left Arm, Standard)    BP 128/81   Pulse 67   Temp 98.4 F (36.9 C) (Oral)   Resp 18   SpO2 100%    Physical Exam [name] MD; 12/23/2018 11:53 AM)  General Mental Status-Alert. General Appearance-Not in acute distress, Not Sickly. Orientation-Oriented X3. Hydration-Well hydrated. Voice-Normal.  Integumentary Global Assessment Upon inspection and palpation of skin surfaces of the - Axillae: non-tender, no inflammation or ulceration, no drainage. and Distribution of scalp and body hair is normal. General Characteristics Temperature - normal warmth is noted.   Head and Neck Head-normocephalic, atraumatic with no  lesions or palpable masses. Face Global Assessment - atraumatic, no absence of expression. Neck Global Assessment - no abnormal movements, no bruit auscultated on the right, no bruit auscultated on the left, no decreased range of motion, non-tender. Trachea-midline. Thyroid Gland Characteristics - non-tender.  Eye Eyeball - Left-Extraocular movements intact, No Nystagmus. Eyeball - Right-Extraocular movements intact, No Nystagmus. Cornea - Left-No Hazy. Cornea - Right-No Hazy. Sclera/Conjunctiva - Left-No scleral icterus, No Discharge. Sclera/Conjunctiva - Right-No scleral icterus, No Discharge. Pupil - Left-Direct reaction to light normal. Pupil - Right-Direct reaction to light normal.  ENMT Ears Pinna - Left - no drainage observed, no generalized tenderness observed. Right - no drainage observed, no generalized tenderness observed. Nose and Sinuses External Inspection of the Nose - no destructive lesion observed. Inspection of the nares - Left - quiet respiration. Right - quiet respiration. Mouth and Throat Lips - Upper Lip - no fissures observed, no pallor noted. Lower Lip - no fissures observed, no pallor noted. Nasopharynx - no discharge present. Oral Cavity/Oropharynx - Tongue - no dryness observed. Oral Mucosa - no cyanosis observed. Hypopharynx - no evidence of airway distress observed.  Chest and Lung Exam Inspection Movements - Normal and Symmetrical. Accessory muscles - No use of accessory muscles in breathing. Palpation Palpation of the chest reveals - Non-tender. Auscultation Breath sounds - Normal and Clear.  Cardiovascular Auscultation Rhythm - Regular. Murmurs & Other Heart Sounds - Auscultation of the heart reveals - No Murmurs and No Systolic Clicks.  Abdomen Inspection Inspection of the abdomen reveals - No Visible peristalsis and No Abnormal pulsations. Umbilicus - No  Bleeding, No Urine drainage. Palpation/Percussion Palpation and Percussion of the abdomen reveal - Soft, Non Tender, No Rebound tenderness, No Rigidity (guarding) and No Cutaneous hyperesthesia. Note: Abdomen soft. Nontender. Not distended. Left upper corner punctate hole consistent with his prior gastrostomy tube site. Thin abdominal wall. No pain or guarding. No umbilical or incisional hernias. No guarding.  Male Genitourinary Sexual Maturity Tanner 5 - Adult hair pattern and Adult penile size and shape.  Peripheral Vascular Upper Extremity Inspection - Left - No Cyanotic nailbeds, Not Ischemic. Right - No Cyanotic nailbeds, Not Ischemic.  Neurologic Neurologic evaluation reveals -normal attention span and ability to concentrate, able to name objects and repeat phrases. Appropriate fund of knowledge , normal sensation and normal coordination. Mental Status Affect - not angry, not paranoid. Cranial Nerves-Normal Bilaterally. Gait-Normal.  Neuropsychiatric Mental status exam performed with findings of-able to articulate well with normal speech/language, rate, volume and coherence, thought content normal with ability to perform basic computations and apply abstract reasoning and no evidence of hallucinations, delusions, obsessions or homicidal/suicidal ideation.  Musculoskeletal Global Assessment Spine, Ribs and Pelvis - no instability, subluxation or laxity. Right Upper Extremity - no instability, subluxation or laxity.  Lymphatic Head & Neck  General Head & Neck Lymphatics: Bilateral - Description - No Localized lymphadenopathy. Axillary  General Axillary Region: Bilateral - Description - No Localized lymphadenopathy. Femoral & Inguinal  Generalized Femoral & Inguinal Lymphatics: Left - Description - No Localized lymphadenopathy. Right - Description - No Localized lymphadenopathy.    Assessment & Plan  DIVERTICULITIS OF LARGE INTESTINE WITH ABSCESS  WITHOUT BLEEDING (K57.20) Impression: Pleasant patient final recovering from 2 hospital admissions T straining HIV and oral antibiotics to get over a complex recurrent/persistent attack of diverticulitis sitting on his bladder.  I think he would benefit from colectomy to break the cycle of attacks. Would wait at least 6 weeks from drain removal. That would  place it in early July. Ideally he needs get a colonoscopy done beforehand to rule out any tumor or any other lesions. He is overdue for 5 year follow-up since his last colonoscopy was in 2014 by Dr. Henrene Pastor with [facility] gastro[facility]ology  Because this was so intimately involved with the bladder and his question colovesical fistula, would like to have urology do firefly injection of the ureters at the start of the case to make sure that is not a factor. Might require repair of a colovesical fistula. While suspicious on the initial CT scan, he has no pneumaturia and there is no evidence of fistula on the drain. Hopefully not too likely.  He's concerned about getting back to work. He wants to get back but does not know if he is able to do things physically yet. I did note he will need up to 6 weeks after surgery before letting him go back unrestricted with his heavy lifting job at [employer]. He already has has FMLA I believe through his PCP.  The anatomy & physiology of the digestive tract was discussed. The pathophysiology of the colon was discussed. Natural history risks without surgery was discussed. I feel the risks of no intervention will lead to serious problems that outweigh the operative risks; therefore, I recommended a partial colectomy to remove the pathology. Minimally invasive (Robotic/Laparoscopic) & open techniques were discussed.  Risks such as bleeding, infection, abscess, leak, reoperation, possible ostomy, hernia, heart attack, death, and other risks were discussed. I noted a good likelihood this will help address the problem. Goals  of post-operative recovery were discussed as well. Need for adequate nutrition, daily bowel regimen and healthy physical activity, to optimize recovery was noted as well. We will work to minimize complications. Educational materials were available as well. Questions were answered. The patient expresses understanding & wishes to proceed with surgery.    [name], MD, FACS, MASCRS Gastrointestinal and Minimally Invasive Surgery    [address] [place], [place] [address] [phone] Main / Paging [phone] Fax

## 2019-03-07 NOTE — Transfer of Care (Signed)
Immediate Anesthesia Transfer of Care Note  Patient: Adrian [name]  Procedure(s) Performed: XI ROBOTIC ASSISTED LOW ANTERIOR RESECTION, RIGID PROCTOSCOPY (N/A Abdomen) ROBOTIC ASSISTED APPENDECTOMY (N/A Abdomen) CYSTOSCOPY WITH BILATERAL FIREFLY INJECTION (Bilateral Ureter)  Patient Location: PACU  Anesthesia Type:General  Level of Consciousness: awake, alert  and oriented  Airway & Oxygen Therapy: Patient Spontanous Breathing and Patient connected to face mask oxygen  Post-op Assessment: Report given to RN and Post -op Vital signs reviewed and stable  Post vital signs: Reviewed and stable  Last Vitals:  Vitals Value Taken Time  BP 142/58 03/07/19 1500  Temp    Pulse 64 03/07/19 1502  Resp 18 03/07/19 1502  SpO2 100 % 03/07/19 1502  Vitals shown include unvalidated device data.  Last Pain:  Vitals:   03/07/19 1032  TempSrc: Oral         Complications: No apparent anesthesia complications

## 2019-03-07 NOTE — Op Note (Signed)
Preoperative diagnosis:  1. Pelvic Abscess  2.  Diverticulitis Postoperative diagnosis:  1. Same   Procedure: 1. Cystoscopy, insertion of ureteral firefly   Surgeon: Ardis Hughs, MD   Anesthesia: General   Complications: None   Intraoperative findings: bladder with diffuse inflammation, mild trabeculation, UOs orthotopic  EBL: Minimal   Specimens: None   Indication:@ is a 63 y.o.  patient with diverticular abscess.  Dr. Johney Maine requested ureteral instillation of firefly to help facilitate the dissection of the sigmoid colon.  After reviewing the management options for treatment, he elected to proceed with the above surgical procedure(s). We have discussed the potential benefits and risks of the procedure, side effects of the proposed treatment, the likelihood of the patient achieving the goals of the procedure, and any potential problems that might occur during the procedure or recuperation. Informed consent has been obtained.   Description of procedure:   The patient was taken to the operating room and general anesthesia was induced.  The patient was placed in the dorsal lithotomy position, prepped and draped in the usual sterile fashion, and preoperative antibiotics were administered. A preoperative time-out was performed.    A 21 French 30 degree cystoscope was gently passed through the patient's urethra into the bladder.  The bladder was subsequently emptied and then filled slowly up performing a 360 degrees cystoscopic evaluation. This demonstrated orthotopic ureteral orifices, normal bladder mucosa with no evidence of colovesical fistula without mucosal abnormality.   I then advanced a 5 [place] open-ended ureteral catheter into the patient's left ureteral orifice and instilled 7.67mL of firefily.  Subsequently turned my attention to the patient's right ureteral orifice and using the same catheter instilled an additional 7.62mL/  The bladder was subsequently emptied and a 3-way  foley catheter was placed.   The surgery was then turned over to Dr. Johney Maine for facilitation of the remainder of the case.

## 2019-03-07 NOTE — Anesthesia Procedure Notes (Signed)
Procedure Name: Intubation Date/Time: 03/07/2019 11:56 AM Performed by: Glory Buff, CRNA Pre-anesthesia Checklist: Patient identified, Emergency Drugs available, Suction available and Patient being monitored Patient Re-evaluated:Patient Re-evaluated prior to induction Oxygen Delivery Method: Circle system utilized Preoxygenation: Pre-oxygenation with 100% oxygen Induction Type: IV induction Ventilation: Mask ventilation without difficulty Laryngoscope Size: Miller and 3 Grade View: Grade I Tube type: Oral Tube size: 7.5 mm Number of attempts: 1 Airway Equipment and Method: Stylet and Oral airway Placement Confirmation: ETT inserted through vocal cords under direct vision,  positive ETCO2 and breath sounds checked- equal and bilateral Secured at: 21 cm Tube secured with: Tape Dental Injury: Teeth and Oropharynx as per pre-operative assessment

## 2019-03-07 NOTE — Plan of Care (Signed)
Patient received from PACU via stretcher. Pain and nausea well controlled. Will continue to monitor.

## 2019-03-07 NOTE — Anesthesia Postprocedure Evaluation (Signed)
Anesthesia Post Note  Patient: Adrian [name]  Procedure(s) Performed: XI ROBOTIC ASSISTED LOW ANTERIOR RESECTION, RIGID PROCTOSCOPY (N/A Abdomen) ROBOTIC ASSISTED APPENDECTOMY (N/A Abdomen) CYSTOSCOPY WITH BILATERAL FIREFLY INJECTION (Bilateral Ureter)     Patient location during evaluation: PACU Anesthesia Type: General Level of consciousness: awake and alert and oriented Pain management: pain level controlled Vital Signs Assessment: post-procedure vital signs reviewed and stable Respiratory status: spontaneous breathing, nonlabored ventilation and respiratory function stable Cardiovascular status: blood pressure returned to baseline and stable Postop Assessment: no apparent nausea or vomiting Anesthetic complications: no    Last Vitals:  Vitals:   03/07/19 1500 03/07/19 1515  BP: (!) 142/58 (!) 142/71  Pulse: 66 63  Resp: 17 18  Temp: (!) 36.3 C   SpO2: 100% 96%    Last Pain:  Vitals:   03/07/19 1500  TempSrc:   PainSc: 0-No pain                 Rever Pichette A.

## 2019-03-08 ENCOUNTER — Encounter (HOSPITAL_COMMUNITY)
Admission: RE | Disposition: A | Discharge status: Home / Self Care | Payer: Self-pay | Source: Home / Self Care | Attending: Surgery

## 2019-03-08 ENCOUNTER — Encounter (HOSPITAL_COMMUNITY): Payer: Self-pay | Admitting: Surgery

## 2019-03-08 ENCOUNTER — Inpatient Hospital Stay (HOSPITAL_COMMUNITY): Payer: BC Managed Care – PPO

## 2019-03-08 DIAGNOSIS — K5720 Diverticulitis of large intestine with perforation and abscess without bleeding: Principal | ICD-10-CM

## 2019-03-08 DIAGNOSIS — I639 Cerebral infarction, unspecified: Secondary | ICD-10-CM

## 2019-03-08 HISTORY — PX: RADIOLOGY WITH ANESTHESIA: SHX6223

## 2019-03-08 LAB — CBC
HCT: 37.5 % — ABNORMAL LOW (ref 39.0–52.0)
Hemoglobin: 12.8 g/dL — ABNORMAL LOW (ref 13.0–17.0)
MCH: 31.4 pg (ref 26.0–34.0)
MCHC: 34.1 g/dL (ref 30.0–36.0)
MCV: 92.1 fL (ref 80.0–100.0)
Platelets: 124 10*3/uL — ABNORMAL LOW (ref 150–400)
RBC: 4.07 MIL/uL — ABNORMAL LOW (ref 4.22–5.81)
RDW: 16 % — ABNORMAL HIGH (ref 11.5–15.5)
WBC: 3.1 10*3/uL — ABNORMAL LOW (ref 4.0–10.5)
nRBC: 0 % (ref 0.0–0.2)

## 2019-03-08 LAB — BASIC METABOLIC PANEL
Anion gap: 7 (ref 5–15)
BUN: 10 mg/dL (ref 8–23)
CO2: 27 mmol/L (ref 22–32)
Calcium: 8.5 mg/dL — ABNORMAL LOW (ref 8.9–10.3)
Chloride: 102 mmol/L (ref 98–111)
Creatinine, Ser: 0.68 mg/dL (ref 0.61–1.24)
GFR calc Af Amer: >60 mL/min (ref >60)
GFR calc non Af Amer: >60 mL/min (ref >60)
Glucose, Bld: 135 mg/dL — ABNORMAL HIGH (ref 70–99)
Potassium: 4.1 mmol/L (ref 3.5–5.1)
Sodium: 136 mmol/L (ref 135–145)

## 2019-03-08 LAB — GLUCOSE, CAPILLARY: Glucose-Capillary: 107 mg/dL — ABNORMAL HIGH (ref 70–99)

## 2019-03-08 LAB — MAGNESIUM: Magnesium: 1.8 mg/dL (ref 1.7–2.4)

## 2019-03-08 SURGERY — IR WITH ANESTHESIA
Anesthesia: General

## 2019-03-08 MED ORDER — SODIUM CHLORIDE (PF) 0.9 % IJ SOLN
INTRAMUSCULAR | Status: AC
  Filled 2019-03-08: qty 50

## 2019-03-08 MED ORDER — IOHEXOL 350 MG/ML SOLN
50.0000 mL | Freq: Once | INTRAVENOUS | Status: AC | PRN
  Administered 2019-03-08: 50 mL via INTRAVENOUS

## 2019-03-08 MED ORDER — IOHEXOL 350 MG/ML SOLN
100.0000 mL | Freq: Once | INTRAVENOUS | Status: AC | PRN
  Administered 2019-03-08: 100 mL via INTRAVENOUS

## 2019-03-08 NOTE — Progress Notes (Signed)
Patient ID: Adrian [name], male   DOB: 16-Nov-1955, 63 y.o.   MRN: [id number]  [facility]  Notified by rapid response nurse that patient had acute onset of neurologic symptoms including expressive aphasia, word salad, and confusion.  Rapid response nurse and house coverage nurse activated Code Stroke and took patient for urgent CT scan.  I discussed CT results with Dr. Nelson Chimes.  Patient has a hyperdense lesion in the left MCA consistent with an embolic event.  He will be transferred [facility] immediately to [facility] for interventional neuroradiology to manage.  Neuro team aware and making arrangements.  I ordered transfer and House Coverage, Ms. Epperson will notify [facility] to facilitate transfer.  I will contact patient's wife with update.  I will also notify the patient's attending, Dr. Johney Maine, and our surgical team at [facility] of these events.  Armandina Gemma, [facility] Office: [phone]

## 2019-03-08 NOTE — Progress Notes (Addendum)
Notified of patient arrival to 4N for potential intervention. Seen by tele neuro - not notified by tele, [facility] or primary team. [facility] accepted to [facility] from [place] - with NO notification or communication with me. Will safetyzone later after patient care. Will assess patient  and consult. Full note to follow  -- Amie Portland, MD [facility] Pager: [phone] If 7pm to 7am, please call on call as listed on AMION.

## 2019-03-08 NOTE — Progress Notes (Signed)
Rapid Response Event Note  Overview: Called at 2045, arrived at 2049 for pt having facial droop and slurred speech.      Initial Focused Assessment: Found pt to have word salad, expressive aphasia, able to follow commands.  Grips are equal, Pupils are equal and reactive.  Unable to identify pictures.  Pt says he knows what he wants to say but can't say it.  Last seen normal at [place].  Pt last had eliquis on Tuesday (for history of A-fib), prior to his surgery yesterday.  Lovenox last taken at 8 am for DVT prophylaxis.   Interventions: Pt placed on monitor HR 112, b/p 107/83, rr 20, cbg 107, CT called and orders placed per protocol.  AC called pt taken to CT.  See new orders for scans, per Harlow Asa, MD.  Teleneuro contacted for evaluation.  Call made to [place], MD regarding pt status.  Code Stroke in place per Sheila Oats, Therapist, sports.   Dr Harlow Asa to call pt wife for update.  Tele-Neuro MD to call [facility] and speak with Neuro to accept pt.  Plan of Care, pt to transfer to [facility] for procedure, leaving at 2215 via [facility].    Dyann Ruddle

## 2019-03-08 NOTE — Consult Note (Addendum)
[facility] TeleNeurology Consult Services   Date of Service:   03/08/2019 93:79:02  Impression:     .  Rule Out Acute Ischemic Stroke  Comments/Sign-Out: 51 M, here with acute onset expressive aphasia, concerning for acute L MCA stroke. NIHSS 2, not a TPA candidate as he had bowel surgery yesterday.  CT head: 1. Normal appearance of the brain itself. However, there is a definite hyperdense left M2 branch consistent with an acute embolus. Right frontal sinusitis. 2. ASPECTS is 10.  CTA head/neck: Acute occlusion of the left ICA at its origin. Reconstitution of the ICA by collaterals at the skull base level. Occluded left M2 branch.  Upon completion of the CT scan, during my assessment of the pt, I was informed by nursing staff that a physician on the pt's primary team had already initiated a call to [place] to expedite a transfer to [facility]. I then called the [facility] transfer center myself at 7:10 PM PST to provide signout and was told that Dr. Rory Percy was already made aware of the case, had accepted the patient, and that transport was already enroute to pick up patient.  PLAN  - pt to be transferred to [facility] for CT perfusion and consideration for thrombectomy  - per [facility], pt accepted by Dr. Amie Portland  ---  Metrics: Last Known Well: 03/08/2019 19:30:00 [facility] Notification Time: 03/08/2019 40:97:35 Stamp Time: 03/08/2019 32:99:24 Time First Login Attempt: 03/08/2019 21:39:26 Video Start Time: 03/08/2019 21:39:26  Symptoms: aphasia. NIHSS Start Assessment Time: 03/08/2019 21:43:54 Patient is not a candidate for tPA. Patient was not deemed candidate for tPA thrombolytics because of recent surgery. Video End Time: 03/08/2019 21:59:16  CT head showed no acute hemorrhage or acute core infarct.  Lower Likelihood of Large Vessel Occlusion but Following Stat Studies are Recommended  CTA Head and Neck.  Discussed with [place] on 03/08/2019 22:11:06  Sign Out:     .  Discussed with Rapid Response Team  ------------------------------------------------------------------------------  History of Present Illness: Patient is a 63 year old Male.  Inpatient stroke alert was called for symptoms of aphasia.  63 M, who is s/p exploratory laparatomy with small bowel resection due to sigmoid diverticulitis on 03/07/19. He also has a history of aflutter on eliquis (last dose was 4 days ago, stopped in prep for this surgery). He was LKW at 7:30 PM today. When staff went to check in on him around 9 PM, he was noted to have 'word salad', and couldn't clearly express himself. Stroke alert was activated. NIHSS 2 on my assessment for inability to answer the month and mild expressive aphasia. Repetition and naming was intact. Not a TPA candidate as pt has recently undergone surgery.   Examination: 1A: Level of Consciousness - Alert; keenly responsive + 0 1B: Ask Month and Age - 1 Question Right + 1 1C: Blink Eyes & Squeeze Hands - Performs Both Tasks + 0 2: Test Horizontal Extraocular Movements - Normal + 0 3: Test Visual Fields - No Visual Loss + 0 4: Test Facial Palsy (Use Grimace if Obtunded) - Normal symmetry + 0 5A: Test Left Arm Motor Drift - No Drift for 10 Seconds + 0 5B: Test Right Arm Motor Drift - No Drift for 10 Seconds + 0 6A: Test Left Leg Motor Drift - No Drift for 5 Seconds + 0 6B: Test Right Leg Motor Drift - No Drift for 5 Seconds + 0 7: Test Limb Ataxia (FNF/Heel-Shin) - No Ataxia + 0  8: Test Sensation - Normal; No sensory loss + 0 9: Test Language/Aphasia - Mild-Moderate Aphasia: Some Obvious Changes, Without Significant Limitation + 1 10: Test Dysarthria - Normal + 0 11: Test Extinction/Inattention - No abnormality + 0  NIHSS Score: 2   Due to the immediate potential for life-threatening deterioration due to underlying acute neurologic illness, I spent 20 minutes providing critical care. This time  includes time for face to face visit via telemedicine, review of medical records, imaging studies and discussion of findings with providers, the patient and/or family.   Dr Burtis Junes   [facility] [phone]   Case [id number]

## 2019-03-08 NOTE — Anesthesia Preprocedure Evaluation (Addendum)
Anesthesia Evaluation  Patient identified by MRN, date of birth, ID band Patient awake    Reviewed: Allergy & Precautions, NPO status , Patient's Chart, lab work & pertinent test results  Airway Mallampati: I  TM Distance: >3 FB Neck ROM: Full    Dental  (+) Dental Advisory Given, Teeth Intact   Pulmonary Current Smoker,    Pulmonary exam normal        Cardiovascular Normal cardiovascular exam     Neuro/Psych    GI/Hepatic   Endo/Other    Renal/GU      Musculoskeletal   Abdominal   Peds  Hematology   Anesthesia Other Findings   Reproductive/Obstetrics                            Anesthesia Physical Anesthesia Plan  ASA: III and emergent  Anesthesia Plan: General   Post-op Pain Management:    Induction: Intravenous, Rapid sequence and Cricoid pressure planned  PONV Risk Score and Plan: 1 and Ondansetron and Treatment may vary due to age or medical condition  Airway Management Planned: Oral ETT  Additional Equipment: Arterial line  Intra-op Plan:   Post-operative Plan: Possible Post-op intubation/ventilation  Informed Consent: I have reviewed the patients History and Physical, chart, labs and discussed the procedure including the risks, benefits and alternatives for the proposed anesthesia with the patient or authorized representative who has indicated his/her understanding and acceptance.       Plan Discussed with: CRNA and Surgeon  Anesthesia Plan Comments: (Had Bowel Sx yesterday at [facility].)       Anesthesia Quick Evaluation

## 2019-03-08 NOTE — Progress Notes (Signed)
Patient arrived from [facility] to 4N32 as a code stroke. Notified Dr Rory Percy who will come see patient. NIHSS 3 at this time for aphasia and mild R droop.

## 2019-03-08 NOTE — Consult Note (Addendum)
Neurology Consultation  Reason for Consult: Acute code stroke Referring Physician: Dr. Armandina Gemma  CC: Word finding difficulty History is obtained from: Chart review, patient's, patient's wife over the phone - Theodosia Blender  HPI: Adrian [name] is a 63 y.o. male coronary artery disease, tonsillar ca in remission, atrial fibrillation on anticoagulation which was held for surgery since last Tuesday, transferred emergently from [facility] long hospital where he was admitted for diverticulitis of the large intestine with abscess requiring exploratory laparotomy, low anterior resection rigid proctoscopy, appendectomy, last seen normal at 7:30 PM on 03/08/2019 at [facility] when it was noticed that he had difficulty with speech- speech did not make sense. He was having difficulty expressing himself.  A stroke alert was activated and patient was evaluated by telemedicine neurology.  His NIH was 2 on their assessment.  Noncontrast CT of the head was negative for bleed however TPA was not offered due to the recent surgery as he is postop day 1 from the surgery. CTA head and neck was also done which revealed a left cervical carotid occlusion as well as left MCA occlusion, with likelihood that he might be amenable for IR intervention. The patient was transferred over to the ICU at [facility] without notification of neurology services at this facility. I was notified by the phone with ICU RN receiving the patient upon patient's arrival that the patient has arrived.  I had to review the chart, speak with the telemetry neurologist over the phone as well as speak with the primary service to gather more history. I took him immediately down to the CT scanner for a CT perfusion study, which showed a favorable profile for thrombectomy.  Case was discussed with Dr. Estanislado Pandy from interventional neuroradiology who agreed to proceed with the procedure after we obtained a consent on a three-way call with the  patient's wife over the phone.    LKW: 7:30 PM on 03/08/2019 tpa given?: no, exploratory laparotomy done 1 day ago Premorbid modified Rankin scale (mRS): 0 ROS: ROS was performed and is negative except as noted in the HPI.   Past Medical History:  Diagnosis Date  . AF (paroxysmal atrial fibrillation) ([place]) 11/29/2018  . Atrial flutter ([place]) 10/12/2015  . CAD (coronary artery disease), native coronary artery 12/06/2015   a. 10/2015 MV: EF  37%, reversible defect inferior apex, intermediate risk findings; b. 10/2015 Cath: 20% mid RCA.  . Colonic diverticular abscess   . Diverticulitis   . History of chemotherapy 2005   Cisplatin  . Hypothyroidism   . NICM (nonischemic cardiomyopathy) ([place]) 10/14/2015   a. Tachy mediated?;  b. Echo 3/17 - Mild concentric LVH, EF 30-35%, anteroseptal, anterior, anterolateral, apical anterior, lateral hypokinesis, trivial MR, mild to moderately reduced RVSF; c. LHC 3/17 - mRCA 20%  . Paroxysmal atrial flutter ([place])    a. TEE 3/17 with ? LAA clot-->s/p TEE/DCCV 11/24/2015  . Radiation NOv.3,2005-Dec. 15, 2005   6810 cGy in 30 fractions  . Tonsil cancer [facility]) 2005   Dr Romeo Rabon.  XRT    Family History  Problem Relation Age of Onset  . Prostate cancer Father   . Colon cancer Father   . Skin cancer Mother   . Cancer Brother   . Esophageal cancer Neg Hx   . Rectal cancer Neg Hx   . Stomach cancer Neg Hx    Social History:   reports that he has been smoking cigarettes. He has a 10.00 pack-year smoking history. He has never  used smokeless tobacco. He reports current alcohol use of about 12.0 standard drinks of alcohol per week. He reports that he does not use drugs.  Medications  Current Facility-Administered Medications:  .  0.9 %  sodium chloride infusion, , Intravenous, Q8H PRN, Michael Boston, MD .  0.9 %  sodium chloride infusion, 250 mL, Intravenous, PRN, Michael Boston, MD .  acetaminophen (TYLENOL) tablet 1,000 mg, 1,000 mg, Oral, Q6H, Michael Boston,  MD, 1,000 mg at 03/08/19 1708 .  acetaminophen (TYLENOL) tablet 650 mg, 650 mg, Oral, Q6H PRN, Michael Boston, MD .  alum & mag hydroxide-simeth (MAALOX/MYLANTA) 200-200-20 MG/5ML suspension 30 mL, 30 mL, Oral, Q6H PRN, Michael Boston, MD .  alvimopan (ENTEREG) capsule 12 mg, 12 mg, Oral, BID, Michael Boston, MD, 12 mg at 03/08/19 1026 .  atorvastatin (LIPITOR) tablet 40 mg, 40 mg, Oral, QPM, Michael Boston, MD, 40 mg at 03/08/19 1707 .  diphenhydrAMINE (BENADRYL) 12.5 MG/5ML elixir 12.5 mg, 12.5 mg, Oral, Q6H PRN **OR** diphenhydrAMINE (BENADRYL) injection 12.5 mg, 12.5 mg, Intravenous, Q6H PRN, Michael Boston, MD .  enoxaparin (LOVENOX) injection 40 mg, 40 mg, Subcutaneous, Q24H, Michael Boston, MD, 40 mg at 03/08/19 0756 .  feeding supplement (BOOST / RESOURCE BREEZE) liquid 1 Container, 1 Container, Oral, TID BM, Michael Boston, MD, 1 Container at 03/08/19 1930 .  feeding supplement (ENSURE SURGERY) liquid 237 mL, 237 mL, Oral, BID BM, Michael Boston, MD, 237 mL at 03/08/19 1028 .  gabapentin (NEURONTIN) capsule 200 mg, 200 mg, Oral, TID, Michael Boston, MD, 200 mg at 03/08/19 1707 .  hydrALAZINE (APRESOLINE) injection 10 mg, 10 mg, Intravenous, Q2H PRN, Michael Boston, MD .  HYDROmorphone (DILAUDID) injection 0.5-2 mg, 0.5-2 mg, Intravenous, Q4H PRN, Michael Boston, MD .  levothyroxine (SYNTHROID) tablet 125 mcg, 125 mcg, Oral, Q0600, Michael Boston, MD, 125 mcg at 03/08/19 0557 .  lip balm (CARMEX) ointment 1 application, 1 application, Topical, BID, Michael Boston, MD, 1 application at 47/82/95 1028 .  magic mouthwash, 15 mL, Oral, QID PRN, Michael Boston, MD .  metoprolol tartrate (LOPRESSOR) injection 5 mg, 5 mg, Intravenous, Q6H PRN, Michael Boston, MD .  nebivolol (BYSTOLIC) tablet 10 mg, 10 mg, Oral, Daily, Michael Boston, MD, 10 mg at 03/08/19 1026 .  ondansetron (ZOFRAN) tablet 4 mg, 4 mg, Oral, Q6H PRN **OR** ondansetron (ZOFRAN) injection 4 mg, 4 mg, Intravenous, Q6H PRN, Michael Boston, MD .   prochlorperazine (COMPAZINE) tablet 10 mg, 10 mg, Oral, Q6H PRN **OR** prochlorperazine (COMPAZINE) injection 5-10 mg, 5-10 mg, Intravenous, Q6H PRN, Michael Boston, MD .  saccharomyces boulardii (FLORASTOR) capsule 250 mg, 250 mg, Oral, BID, Michael Boston, MD, 250 mg at 03/08/19 1026 .  sodium chloride (PF) 0.9 % injection, , , ,  .  sodium chloride flush (NS) 0.9 % injection 3 mL, 3 mL, Intravenous, Q12H, Michael Boston, MD, 3 mL at 03/08/19 1029 .  sodium chloride flush (NS) 0.9 % injection 3 mL, 3 mL, Intravenous, PRN, Michael Boston, MD .  traMADol Veatrice Bourbon) tablet 50-100 mg, 50-100 mg, Oral, Q6H PRN, Michael Boston, MD, 50 mg at 03/07/19 2142  Exam: Current vital signs: BP 131/63   Pulse 72   Temp 98.3 F (36.8 C) (Oral)   Resp 19   Ht 5\' 8"  (1.727 m)   Wt 61.1 kg   SpO2 95%   BMI 20.48 kg/m  Vital signs in last 24 hours: Temp:  [97.4 F (36.3 C)-98.3 F (36.8 C)] 98.3 F (36.8 C) (08/01 2024) Pulse Rate:  [53-72] 72 (  08/01 2050) Resp:  [14-20] 19 (08/01 2024) BP: (92-131)/(48-73) 131/63 (08/01 2050) SpO2:  [66 %-99 %] 95 % (08/01 2024) Weight:  [61.1 kg] 61.1 kg (08/01 0538) General: Awake alert in no distress HEENT: Normocephalic atraumatic CVS: Regular rate rhythm Respiratory: Breathing normally saturating well on room air Extremities: Warm well perfused Neurological exam Is awake alert oriented to self.  He has gross expressive aphasia with a complete word salad. Speech is moderately dysarthric He is able to follow commands-simple commands consistently and complex commands inconsistently. Repetition is also impaired. Naming is impaired. Cranial nerves: Pupils equal round react light, extraocular movements intact, visual field exam with possible right-sided field cut, right lower facial weakness, auditory daily intact, tongue midline, palate midline. Motor exam: Antigravity in all 4 extremities with no drift Sensory exam: Intact light touch all over without  extinction Coordination: Intact finger-nose-finger bilaterally Gait testing was deferred NIH stroke scale 1a Level of Conscious.: 0 1b LOC Questions: 0 1c LOC Commands: 0 2 Best Gaze: 0 3 Visual: 1 4 Facial Palsy: 1 5a Motor Arm - left: 0 5b Motor Arm - Right: 0 6a Motor Leg - Left: 0 6b Motor Leg - Right: 0 7 Limb Ataxia: 0 8 Sensory: 0 9 Best Language: 2 10 Dysarthria: 1 11 Extinct. and Inatten.: 0 TOTAL: 5  Labs I have reviewed labs in epic and the results pertinent to this consultation are:  CBC    Component Value Date/Time   WBC 3.1 (L) 03/08/2019 0906   RBC 4.07 (L) 03/08/2019 0906   HGB 12.8 (L) 03/08/2019 0906   HGB 15.4 01/23/2017 0826   HGB 15.4 10/24/2010 0927   HCT 37.5 (L) 03/08/2019 0906   HCT 45.9 01/23/2017 0826   HCT 43.9 10/24/2010 0927   PLT 124 (L) 03/08/2019 0906   PLT 214 01/23/2017 0826   MCV 92.1 03/08/2019 0906   MCV 94 01/23/2017 0826   MCV 99.7 (H) 10/24/2010 0927   MCH 31.4 03/08/2019 0906   MCHC 34.1 03/08/2019 0906   RDW 16.0 (H) 03/08/2019 0906   RDW 14.5 01/23/2017 0826   RDW 12.8 10/24/2010 0927   LYMPHSABS 2.1 11/29/2018 1647   LYMPHSABS 1.0 10/24/2010 0927   MONOABS 1.3 (H) 11/29/2018 1647   MONOABS 0.5 10/24/2010 0927   EOSABS 0.1 11/29/2018 1647   EOSABS 0.1 10/24/2010 0927   BASOSABS 0.0 11/29/2018 1647   BASOSABS 0.0 10/24/2010 0927    CMP     Component Value Date/Time   NA 136 03/08/2019 0906   NA 134 01/23/2017 0826   K 4.1 03/08/2019 0906   CL 102 03/08/2019 0906   CO2 27 03/08/2019 0906   GLUCOSE 135 (H) 03/08/2019 0906   BUN 10 03/08/2019 0906   BUN 6 (L) 01/23/2017 0826   CREATININE 0.68 03/08/2019 0906   CREATININE 0.88 11/22/2015 1137   CALCIUM 8.5 (L) 03/08/2019 0906   PROT 7.5 11/29/2018 1647   PROT 7.1 03/09/2017 1032   ALBUMIN 3.0 (L) 11/29/2018 1647   ALBUMIN 4.4 03/09/2017 1032   AST 13 (L) 11/29/2018 1647   ALT 13 11/29/2018 1647   ALKPHOS 73 11/29/2018 1647   BILITOT 0.6 11/29/2018 1647    BILITOT 0.4 03/09/2017 1032   GFRNONAA >60 03/08/2019 0906   GFRAA >60 03/08/2019 0906   Imaging I have reviewed the images obtained  CT-scan of the brain done at [facility] with aspects 10.  No bleed. CTA head and neck with left ICA occlusion at the origin with  reconstitution by collaterals at the skull base and occluded left M2 branch. CT perfusion done at [facility] with infarct core of 20 cc and penumbra of 114 cc with mismatch volume of 94 cc.  Assessment: 72 old man with coronary artery disease, atrial fibrillation on anticoagulation who recently had a surgery for diverticulitis 1 day ago had a sudden onset of word finding difficulty and garbled speech. Initially evaluated by telemedicine neurology with an NIH stroke scale of 2. On my assessment, NIH stroke scale 5 after he was transferred to [facility] presumably for an intervention without notification of the primary team. On my evaluation, he had the exam documented above. CT of the head with aspects 10. CTA head and neck with left ICA occlusion at the origin and left M2 branch occlusion. CT perfusion study was done here which showed a 20 cc core and 114 cc penumbra with a mismatch of 94 cc. Case discussed with endovascular neuroradiology-Dr. Estanislado Pandy, who agreed to proceed with thrombectomy after obtaining consent with wife on a three-way call. And I will be consent was also signed because the NIH stroke scale was less than 6 at the time of assessment. Risks and benefits discussed with the wife.  Due to the extremely debilitating symptoms of aphasia and dysarthria and field cut, wife agreed to proceed with intervention understanding the risks of the procedure explained by Dr. Estanislado Pandy.  At the time of this note dictation, patient is in IR.  Impression: Acute ischemic stroke-likely cardioembolic due to underlying atrial fibrillation and held anticoagulation for the exploratory  laparotomy  Recommendations: Acute Ischemic Stroke Cerebral infarction due to embolism of left middle cerebral artery  Occlusion and stenosis of L carotid artery Acuity: Acute Current Suspected Etiology: Cardioembolic Continue Evaluation:  -Admit to: Neurological ICU -Antiplatelets based on the result of the diagnostic cerebral angiogram and intervention-if a stent is placed in the carotid, might need dual antiplatelets.  Defer to IR. -Blood pressure goal-if revascularization is successful, blood pressure goal-systolic between 1 81-0 40. If revascularization fails, allow for permissive hypertension -MRI/ECHO/A1C/Lipid panel. -Hyperglycemia management per SSI to maintain glucose 140-180mg /dL. -PT/OT/ST therapies and recommendations when able  CNS -Close neuro monitoring  Dysarthria Dysphagia following cerebral infarction  -NPO until cleared by speech -ST -PT/OT -PM&R consult  RESP Intubated for IR procedure-acute Respiratory Failure  -Attempt to extubate in IR -If not extubated, vent management per ICU -wean when able  CV Essential (primary) hypertension -Aggressive BP control as above -Labetalol and Cleviprex -TTE  Hyperlipidemia, unspecified  - Statin for goal LDL < 70  Chronic atrial fibrillation -Rate control -Hold anticoagulation for now  HEME Anemia. -Monitor -transfuse for hgb < 7   ENDO No active issues Goal hemoglobin A1c less than 7  GI/GU Status post exploratory laparotomy -management per surgery  Fluid/Electrolyte Disorders Check labs -Replete  as necessary   Prophylaxis DVT:   SCDs for now GI: PPI Bowel: Docusate senna  Diet: NPO until cleared by primary team and bedside swallow evaluation.  Code Status: Full Code   -- Amie Portland, MD [facility] Pager: [phone] If 7pm to 7am, please call on call as listed on AMION.   CRITICAL CARE ATTESTATION Performed by: Amie Portland, MD Total critical care time: 65  minutes Critical care time was exclusive of separately billable procedures and treating other patients and/or supervising APPs/Residents/Students Critical care was necessary to treat or prevent imminent or life-threatening deterioration due to acute ischemic stroke This patient is critically ill and at  significant risk for neurological worsening and/or death and care requires constant monitoring. Critical care was time spent personally by me on the following activities: development of treatment plan with patient and/or surrogate as well as nursing, discussions with consultants, evaluation of patient's response to treatment, examination of patient, obtaining history from patient or surrogate, ordering and performing treatments and interventions, ordering and review of laboratory studies, ordering and review of radiographic studies, pulse oximetry, re-evaluation of patient's condition, participation in multidisciplinary rounds and medical decision making of high complexity in the care of this patient.

## 2019-03-08 NOTE — Progress Notes (Signed)
Progress Note: General Surgery Service   Assessment/Plan: Principal Problem:   Diverticulitis of large intestine with abscess Active Problems:   History of radiation therapy   Smokes tobacco daily   Hypothyroidism   NICM (nonischemic cardiomyopathy) (HCC)   CAD (coronary artery disease), native coronary artery   Chronic atrial fibrillation   Current use of long term anticoagulation   Tonsil cancer ([place])   History of chemotherapy   Diverticular stricture ([place])  s/p Procedure(s): XI ROBOTIC ASSISTED LOW ANTERIOR RESECTION, RIGID PROCTOSCOPY CYSTOSCOPY WITH BILATERAL FIREFLY INJECTION ROBOTIC ASSISTED APPENDECTOMY 03/07/2019  -advance diet -foley out this morning -stop IVF -ambulate   LOS: 1 day  Chief Complaint/Subjective: +flatus, small bm, tolerating liquids, pain well controlled  Objective: Vital signs in last 24 hours: Temp:  [97.4 F (36.3 C)-98.4 F (36.9 C)] 97.7 F (36.5 C) (08/01 0538) Pulse Rate:  [52-67] 55 (08/01 0538) Resp:  [14-19] 16 (08/01 0538) BP: (93-155)/(57-81) 93/57 (08/01 0538) SpO2:  [96 %-100 %] 99 % (08/01 0538) Weight:  [59.9 kg-61.1 kg] 61.1 kg (08/01 0538) Last BM Date: 03/06/19  Intake/Output from previous day: 07/31 0701 - 08/01 0700 In: 2148.9 [P.O.:360; I.V.:1688.9; IV Piggyback:100] Out: 1375 [Urine:1350; Blood:25] Intake/Output this shift: Total I/O In: -  Out: 400 [Urine:400]  Lungs: nonlabored  Cardiovascular: regular rate  Abd: soft, minimal tenderness, no distension  Extremities: no edema  Neuro: AOx4  Lab Results: CBC  Recent Labs    03/08/19 0906  WBC 3.1*  HGB 12.8*  HCT 37.5*  PLT 124*   BMET Recent Labs    03/08/19 0906  NA 136  K 4.1  CL 102  CO2 27  GLUCOSE 135*  BUN 10  CREATININE 0.68  CALCIUM 8.5*   PT/INR No results for input(s): LABPROT, INR in the last 72 hours. ABG No results for input(s): PHART, HCO3 in the last 72 hours.  Invalid input(s): PCO2, PO2  Studies/Results:   Anti-infectives: Anti-infectives (From admission, onward)   Start     Dose/Rate Route Frequency Ordered Stop   03/07/19 2200  cefoTEtan (CEFOTAN) 2 g in sodium chloride 0.9 % 100 mL IVPB     2 g 200 mL/hr over 30 Minutes Intravenous Every 12 hours 03/07/19 1700 03/07/19 2210   03/07/19 1434  clindamycin (CLEOCIN) 900 mg, gentamicin (GARAMYCIN) 240 mg in sodium chloride 0.9 % 1,000 mL for intraperitoneal lavage  Status:  Discontinued       As needed 03/07/19 1434 03/07/19 1456   03/07/19 1400  neomycin (MYCIFRADIN) tablet 1,000 mg  Status:  Discontinued     1,000 mg Oral 3 times per day 03/07/19 1026 03/07/19 1641   03/07/19 1400  metroNIDAZOLE (FLAGYL) tablet 1,000 mg  Status:  Discontinued     1,000 mg Oral 3 times per day 03/07/19 1026 03/07/19 1641   03/07/19 1030  cefoTEtan (CEFOTAN) 2 g in sodium chloride 0.9 % 100 mL IVPB     2 g 200 mL/hr over 30 Minutes Intravenous On call to O.R. 03/07/19 1026 03/07/19 1228   03/07/19 0600  clindamycin (CLEOCIN) 900 mg, gentamicin (GARAMYCIN) 240 mg in sodium chloride 0.9 % 1,000 mL for intraperitoneal lavage  Status:  Discontinued      Irrigation To Surgery 03/06/19 1018 03/07/19 1641      Medications: Scheduled Meds: . acetaminophen  1,000 mg Oral Q6H  . alvimopan  12 mg Oral BID  . atorvastatin  40 mg Oral QPM  . enoxaparin (LOVENOX) injection  40 mg Subcutaneous Q24H  .  feeding supplement  1 Container Oral TID BM  . feeding supplement  237 mL Oral BID BM  . gabapentin  200 mg Oral TID  . levothyroxine  125 mcg Oral Q0600  . lip balm  1 application Topical BID  . nebivolol  10 mg Oral Daily  . saccharomyces boulardii  250 mg Oral BID  . sodium chloride flush  3 mL Intravenous Q12H   Continuous Infusions: . sodium chloride    . sodium chloride     PRN Meds:.sodium chloride, sodium chloride, acetaminophen, alum & mag hydroxide-simeth, diphenhydrAMINE **OR** diphenhydrAMINE, hydrALAZINE, HYDROmorphone (DILAUDID) injection, magic  mouthwash, metoprolol tartrate, ondansetron **OR** ondansetron (ZOFRAN) IV, prochlorperazine **OR** prochlorperazine, sodium chloride flush, traMADol  Mickeal Skinner, MD [facility]

## 2019-03-09 ENCOUNTER — Inpatient Hospital Stay (HOSPITAL_COMMUNITY): Payer: BC Managed Care – PPO

## 2019-03-09 ENCOUNTER — Inpatient Hospital Stay (HOSPITAL_COMMUNITY): Payer: BC Managed Care – PPO | Admitting: Anesthesiology

## 2019-03-09 DIAGNOSIS — I6602 Occlusion and stenosis of left middle cerebral artery: Secondary | ICD-10-CM | POA: Diagnosis present

## 2019-03-09 DIAGNOSIS — I639 Cerebral infarction, unspecified: Secondary | ICD-10-CM

## 2019-03-09 DIAGNOSIS — I959 Hypotension, unspecified: Secondary | ICD-10-CM

## 2019-03-09 HISTORY — PX: IR ANGIO VERTEBRAL SEL VERTEBRAL UNI R MOD SED: IMG5368

## 2019-03-09 HISTORY — PX: IR CT HEAD LTD: IMG2386

## 2019-03-09 HISTORY — PX: IR ANGIO INTRA EXTRACRAN SEL COM CAROTID INNOMINATE UNI R MOD SED: IMG5359

## 2019-03-09 HISTORY — PX: IR PERCUTANEOUS ART THROMBECTOMY/INFUSION INTRACRANIAL INC DIAG ANGIO: IMG6087

## 2019-03-09 LAB — BASIC METABOLIC PANEL
Anion gap: 10 (ref 5–15)
BUN: 7 mg/dL — ABNORMAL LOW (ref 8–23)
CO2: 25 mmol/L (ref 22–32)
Calcium: 8.4 mg/dL — ABNORMAL LOW (ref 8.9–10.3)
Chloride: 104 mmol/L (ref 98–111)
Creatinine, Ser: 0.7 mg/dL (ref 0.61–1.24)
GFR calc Af Amer: >60 mL/min (ref >60)
GFR calc non Af Amer: >60 mL/min (ref >60)
Glucose, Bld: 98 mg/dL (ref 70–99)
Potassium: 3.8 mmol/L (ref 3.5–5.1)
Sodium: 139 mmol/L (ref 135–145)

## 2019-03-09 LAB — ECHOCARDIOGRAM COMPLETE
Height: 68 in
Weight: 2250.46 oz

## 2019-03-09 LAB — APTT
aPTT: 39 seconds — ABNORMAL HIGH (ref 24–36)
aPTT: 55 seconds — ABNORMAL HIGH (ref 24–36)

## 2019-03-09 LAB — CBC WITH DIFFERENTIAL/PLATELET
Band Neutrophils: 4 %
Basophils Absolute: 0 10*3/uL (ref 0.0–0.1)
Basophils Relative: 0 %
Blasts: 0 %
Eosinophils Absolute: 0 10*3/uL (ref 0.0–0.5)
Eosinophils Relative: 0 %
HCT: 36.5 % — ABNORMAL LOW (ref 39.0–52.0)
Hemoglobin: 12.5 g/dL — ABNORMAL LOW (ref 13.0–17.0)
Lymphocytes Relative: 70 %
Lymphs Abs: 3.5 10*3/uL (ref 0.7–4.0)
MCH: 31.5 pg (ref 26.0–34.0)
MCHC: 34.2 g/dL (ref 30.0–36.0)
MCV: 91.9 fL (ref 80.0–100.0)
Metamyelocytes Relative: 0 %
Monocytes Absolute: 0.6 10*3/uL (ref 0.1–1.0)
Monocytes Relative: 12 %
Myelocytes: 0 %
Neutro Abs: 0.9 10*3/uL — ABNORMAL LOW (ref 1.7–7.7)
Neutrophils Relative %: 14 %
Other: 0 %
Platelets: 125 10*3/uL — ABNORMAL LOW (ref 150–400)
Promyelocytes Relative: 0 %
RBC: 3.97 MIL/uL — ABNORMAL LOW (ref 4.22–5.81)
RDW: 16.2 % — ABNORMAL HIGH (ref 11.5–15.5)
WBC: 5 10*3/uL (ref 4.0–10.5)
nRBC: 0 % (ref 0.0–0.2)
nRBC: 0 /100 WBC

## 2019-03-09 LAB — HEPARIN LEVEL (UNFRACTIONATED)
Heparin Unfractionated: 0.17 IU/mL — ABNORMAL LOW (ref 0.30–0.70)
Heparin Unfractionated: 0.13 IU/mL — ABNORMAL LOW (ref 0.30–0.70)

## 2019-03-09 LAB — LIPID PANEL
Cholesterol: 110 mg/dL (ref 0–200)
HDL: 45 mg/dL (ref >40)
LDL Cholesterol: 46 mg/dL (ref 0–99)
Total CHOL/HDL Ratio: 2.4 RATIO
Triglycerides: 97 mg/dL (ref <150)
VLDL: 19 mg/dL (ref 0–40)

## 2019-03-09 LAB — PROTIME-INR
INR: 1.3 — ABNORMAL HIGH (ref 0.8–1.2)
Prothrombin Time: 15.7 seconds — ABNORMAL HIGH (ref 11.4–15.2)

## 2019-03-09 LAB — HEMOGLOBIN A1C
Hgb A1c MFr Bld: 5.5 % (ref 4.8–5.6)
Mean Plasma Glucose: 111.15 mg/dL

## 2019-03-09 LAB — MRSA PCR SCREENING: MRSA by PCR: NEGATIVE

## 2019-03-09 MED ORDER — SODIUM CHLORIDE 0.9 % IV SOLN: 250.0000 mL | INTRAVENOUS | Status: DC

## 2019-03-09 MED ORDER — CLEVIDIPINE BUTYRATE 0.5 MG/ML IV EMUL: 0.0000 mg/h | INTRAVENOUS | Status: AC

## 2019-03-09 MED ORDER — PHENYLEPHRINE HCL-NACL 10-0.9 MG/250ML-% IV SOLN
INTRAVENOUS | Status: AC
  Filled 2019-03-09: qty 250

## 2019-03-09 MED ORDER — SODIUM CHLORIDE 0.9 % IV SOLN
INTRAVENOUS | Status: DC | PRN
  Administered 2019-03-09: via INTRAVENOUS

## 2019-03-09 MED ORDER — IOHEXOL 300 MG/ML  SOLN
300.0000 mL | Freq: Once | INTRAMUSCULAR | Status: AC | PRN
  Administered 2019-03-09: 120 mL via INTRA_ARTERIAL

## 2019-03-09 MED ORDER — ONDANSETRON HCL 4 MG/2ML IJ SOLN
INTRAMUSCULAR | Status: DC | PRN
  Administered 2019-03-09: 4 mg via INTRAVENOUS

## 2019-03-09 MED ORDER — SODIUM CHLORIDE 0.9 % IV SOLN
INTRAVENOUS | Status: DC | PRN
  Administered 2019-03-09: 50 ug/min via INTRAVENOUS

## 2019-03-09 MED ORDER — FENTANYL CITRATE (PF) 100 MCG/2ML IJ SOLN
INTRAMUSCULAR | Status: AC
  Filled 2019-03-09: qty 2

## 2019-03-09 MED ORDER — EPHEDRINE SULFATE 50 MG/ML IJ SOLN
INTRAMUSCULAR | Status: DC | PRN
  Administered 2019-03-09 (×2): 5 mg via INTRAVENOUS

## 2019-03-09 MED ORDER — SUGAMMADEX SODIUM 200 MG/2ML IV SOLN
INTRAVENOUS | Status: DC | PRN
  Administered 2019-03-09: 200 mg via INTRAVENOUS

## 2019-03-09 MED ORDER — CLOPIDOGREL BISULFATE 300 MG PO TABS
ORAL_TABLET | ORAL | Status: AC
  Filled 2019-03-09: qty 1

## 2019-03-09 MED ORDER — ASPIRIN 81 MG PO CHEW
CHEWABLE_TABLET | ORAL | Status: AC
  Filled 2019-03-09: qty 1

## 2019-03-09 MED ORDER — SODIUM CHLORIDE (PF) 0.9 % IJ SOLN
INTRAVENOUS | Status: AC | PRN
  Administered 2019-03-09: 25 ug via INTRA_ARTERIAL

## 2019-03-09 MED ORDER — EPTIFIBATIDE 20 MG/10ML IV SOLN
INTRAVENOUS | Status: AC
  Filled 2019-03-09: qty 10

## 2019-03-09 MED ORDER — TIROFIBAN HCL IN NACL 5-0.9 MG/100ML-% IV SOLN
INTRAVENOUS | Status: AC
  Filled 2019-03-09: qty 100

## 2019-03-09 MED ORDER — FENTANYL CITRATE (PF) 100 MCG/2ML IJ SOLN
INTRAMUSCULAR | Status: DC | PRN
  Administered 2019-03-09: 100 ug via INTRAVENOUS

## 2019-03-09 MED ORDER — ASPIRIN 325 MG PO TABS
ORAL_TABLET | ORAL | Status: AC
  Filled 2019-03-09: qty 1

## 2019-03-09 MED ORDER — ACETAMINOPHEN 160 MG/5ML PO SOLN: 650.0000 mg | ORAL | Status: DC | PRN

## 2019-03-09 MED ORDER — LIDOCAINE 2% (20 MG/ML) 5 ML SYRINGE
INTRAMUSCULAR | Status: DC | PRN
  Administered 2019-03-09: 100 mg via INTRAVENOUS

## 2019-03-09 MED ORDER — ACETAMINOPHEN 650 MG RE SUPP: 650.0000 mg | RECTAL | Status: DC | PRN

## 2019-03-09 MED ORDER — LACTATED RINGERS IV BOLUS
500.0000 mL | Freq: Once | INTRAVENOUS | Status: AC
  Administered 2019-03-09: 500 mL via INTRAVENOUS

## 2019-03-09 MED ORDER — ACETAMINOPHEN 325 MG PO TABS: 650.0000 mg | ORAL_TABLET | ORAL | Status: DC | PRN

## 2019-03-09 MED ORDER — ROCURONIUM BROMIDE 50 MG/5ML IV SOSY
PREFILLED_SYRINGE | INTRAVENOUS | Status: DC | PRN
  Administered 2019-03-09: 10 mg via INTRAVENOUS
  Administered 2019-03-09: 40 mg via INTRAVENOUS
  Administered 2019-03-09: 10 mg via INTRAVENOUS

## 2019-03-09 MED ORDER — STROKE: EARLY STAGES OF RECOVERY BOOK
Freq: Once | Status: AC
  Administered 2019-03-12: 1
  Filled 2019-03-09: qty 1

## 2019-03-09 MED ORDER — CEFAZOLIN SODIUM-DEXTROSE 2-3 GM-%(50ML) IV SOLR
INTRAVENOUS | Status: DC | PRN
  Administered 2019-03-09: 2 g via INTRAVENOUS

## 2019-03-09 MED ORDER — ASPIRIN 81 MG PO CHEW
CHEWABLE_TABLET | ORAL | Status: AC | PRN
  Administered 2019-03-09: 81 mg via NASOGASTRIC

## 2019-03-09 MED ORDER — SODIUM CHLORIDE 0.9 % IV SOLN: INTRAVENOUS | Status: DC

## 2019-03-09 MED ORDER — TICAGRELOR 90 MG PO TABS
ORAL_TABLET | ORAL | Status: AC
  Filled 2019-03-09: qty 2

## 2019-03-09 MED ORDER — SODIUM CHLORIDE 0.9 % IV SOLN
INTRAVENOUS | Status: DC
  Administered 2019-03-09 – 2019-03-13 (×8): via INTRAVENOUS

## 2019-03-09 MED ORDER — TICAGRELOR 60 MG PO TABS
ORAL_TABLET | ORAL | Status: AC | PRN
  Administered 2019-03-09: 180 mg via NASOGASTRIC

## 2019-03-09 MED ORDER — SENNOSIDES-DOCUSATE SODIUM 8.6-50 MG PO TABS: 1.0000 | ORAL_TABLET | Freq: Every evening | ORAL | Status: DC | PRN

## 2019-03-09 MED ORDER — PHENYLEPHRINE HCL-NACL 10-0.9 MG/250ML-% IV SOLN
25.0000 ug/min | INTRAVENOUS | Status: DC
  Administered 2019-03-09: 70 ug/min via INTRAVENOUS
  Administered 2019-03-09: 04:00:00 75 ug/min via INTRAVENOUS
  Administered 2019-03-09: 21:00:00 80 ug/min via INTRAVENOUS
  Administered 2019-03-09: 55 ug/min via INTRAVENOUS
  Administered 2019-03-09: 60 ug/min via INTRAVENOUS
  Administered 2019-03-10 (×2): 50 ug/min via INTRAVENOUS
  Administered 2019-03-10: 30 ug/min via INTRAVENOUS
  Administered 2019-03-10: 50 ug/min via INTRAVENOUS
  Administered 2019-03-11: 16 ug/min via INTRAVENOUS
  Administered 2019-03-11: 25 ug/min via INTRAVENOUS
  Administered 2019-03-12: 20 ug/min via INTRAVENOUS
  Filled 2019-03-09 (×16): qty 250

## 2019-03-09 MED ORDER — LIDOCAINE HCL 1 % IJ SOLN
INTRAMUSCULAR | Status: AC
  Filled 2019-03-09: qty 20

## 2019-03-09 MED ORDER — HEPARIN (PORCINE) 25000 UT/250ML-% IV SOLN
1600.0000 [IU]/h | INTRAVENOUS | Status: DC
  Administered 2019-03-09: 15:00:00 800 [IU]/h via INTRAVENOUS
  Administered 2019-03-10: 19:00:00 1300 [IU]/h via INTRAVENOUS
  Administered 2019-03-12: 1350 [IU]/h via INTRAVENOUS
  Administered 2019-03-13 (×3): 1600 [IU]/h via INTRAVENOUS
  Filled 2019-03-09 (×6): qty 250

## 2019-03-09 MED ORDER — SUCCINYLCHOLINE CHLORIDE 20 MG/ML IJ SOLN
INTRAMUSCULAR | Status: DC | PRN
  Administered 2019-03-09: 140 mg via INTRAVENOUS

## 2019-03-09 MED ORDER — EPTIFIBATIDE 20 MG/10ML IV SOLN
INTRAVENOUS | Status: AC | PRN
  Administered 2019-03-09 (×2): 1.5 mg

## 2019-03-09 MED ORDER — PROPOFOL 10 MG/ML IV BOLUS
INTRAVENOUS | Status: DC | PRN
  Administered 2019-03-09: 140 mg via INTRAVENOUS

## 2019-03-09 MED ORDER — CEFAZOLIN SODIUM-DEXTROSE 2-4 GM/100ML-% IV SOLN
INTRAVENOUS | Status: AC
  Filled 2019-03-09: qty 100

## 2019-03-09 MED ORDER — NITROGLYCERIN 1 MG/10 ML FOR IR/CATH LAB
INTRA_ARTERIAL | Status: AC
  Filled 2019-03-09: qty 10

## 2019-03-09 NOTE — Progress Notes (Addendum)
Addnedum: APTT corrected back to 39 - ok to start IV Heparin as planned.  Orders placed.  Heparin level 0.17 - will follow both aPTT and HL in 8 hours.  If correlating at that time, will follow heparin level only.   Sloan Leiter, PharmD, BCPS, BCCCP Clinical Pharmacist Please refer to [facility] for [place] numbers 03/09/2019, 2:56 PM   ANTICOAGULATION CONSULT NOTE - Initial Consult  Pharmacy Consult for Heparin Indication: atrial fibrillation and stroke  No Known Allergies  Patient Measurements: Height: 5\' 8"  (172.7 cm) Weight: 140 lb 10.5 oz (63.8 kg) IBW/kg (Calculated) : 68.4 Heparin Dosing Weight: 63.8 kg  Vital Signs: Temp: 98.5 F (36.9 C) (08/02 0800) Temp Source: Oral (08/02 0800) BP: 167/78 (08/02 1200) Pulse Rate: 64 (08/02 1200)  Labs: Recent Labs    03/08/19 0906 03/09/19 0215 03/09/19 0656  HGB 12.8*  --  12.5*  HCT 37.5*  --  36.5*  PLT 124*  --  125*  APTT  --  >200*  --   LABPROT  --  15.7*  --   INR  --  1.3*  --   CREATININE 0.68  --  0.70    Estimated Creatinine Clearance: 85.3 mL/min (by C-G formula based on SCr of 0.7 mg/dL).   Medical History: Past Medical History:  Diagnosis Date  . AF (paroxysmal atrial fibrillation) ([place]) 11/29/2018  . Atrial flutter ([place]) 10/12/2015  . CAD (coronary artery disease), native coronary artery 12/06/2015   a. 10/2015 MV: EF  37%, reversible defect inferior apex, intermediate risk findings; b. 10/2015 Cath: 20% mid RCA.  . Colonic diverticular abscess   . Diverticulitis   . History of chemotherapy 2005   Cisplatin  . Hypothyroidism   . NICM (nonischemic cardiomyopathy) ([place]) 10/14/2015   a. Tachy mediated?;  b. Echo 3/17 - Mild concentric LVH, EF 30-35%, anteroseptal, anterior, anterolateral, apical anterior, lateral hypokinesis, trivial MR, mild to moderately reduced RVSF; c. LHC 3/17 - mRCA 20%  . Paroxysmal atrial flutter ([place])    a. TEE 3/17 with ? LAA clot-->s/p TEE/DCCV 11/24/2015  . Radiation  NOv.3,2005-Dec. 15, 2005   6810 cGy in 30 fractions  . 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  XRT    Assessment: 63 year old male on Eliquis prior to admission for atrial fibrillation. Patient was admitted to [facility] long on 7/31 for robot assisted low anterior resection and appendectomy and Eliquis had been held for surgery - last dose on 7/28. POD#1 patient began to experience stroke symptoms and L-MCA stroke noted on CT. Patient was transferred to [facility] for IR intervention. Attempted mechanical thrombectomy was unsuccessful and carotid artery was not amenable to stent.  Last Lovenox dose at 08:48 AM.  Last aPTT >200 and INR 1.3 at 0215. Unsure why aPTT prolonged.  Integrilin given at 0122 but this does NOT typically prolong aPTT by itself. No heparin administrations documented at that time. Last Lovenox prior to aPTT was ~24 hours prior.  Platelets are low at 124 >>125.  H/H 12.5/36.5.  Repeat MRI today reveals L-MCA with trace petechial hemorrhage (Heidelberg Class 1a), no mass effect.   Discussed with Dr. Leonie Man - ok to start IV Heparin if repeat aPTT ok.  Goal of Therapy:  Heparin level 0.3 to 0.5 units/ml aPTT 66-85 seconds Monitor platelets by anticoagulation protocol: Yes   Plan:  Stat aPTT and Heparin level due to recent Eliquis and elevated aPTT.  If aPTT down, will start IV Heparin (NO bolus) at 800 units/hr.  Plan for aPTT and HL in 6 hours after initiation.  Daily aPTT (until Heparin level is correlating), Heparin level, and CBC while on therapy Monitor for bleeding and mental status changes.   Sloan Leiter, PharmD, BCPS, BCCCP Clinical Pharmacist Please refer to [facility] for [place] numbers 03/09/2019,1:59 PM

## 2019-03-09 NOTE — Progress Notes (Signed)
Patient ID: Adrian [name], male   DOB: 1956-06-13, 63 y.o.   MRN: [id number] I called his wife and updated her.  Georganna Skeans, MD, MPH, FACS Trauma & General Surgery: [phone]

## 2019-03-09 NOTE — Evaluation (Addendum)
Physical Therapy Evaluation Patient Details Name: Adrian [name] MRN: [id number] DOB: 07-30-1956 Today's Date: 03/09/2019   History of Present Illness  Pt is a 63 y.o. M with significant PMH of CAD, tonsillar CA in remission, atrial fibrillation on anticoagulation which was held for recent surgery who was transferred emergently from [facility] where had been admitted for diverticulitis of large intestine with abscess requiring exploratory laparotomy, low anterior resection rigid proctoscopy, appendectomy. Presents with difficulty speaking. CTA head and neck showing left cervical carotid occlusion as well as left MCA occlusion. Attempted mechanical thrombectomy was unsuccessful and carotid artery also could not be revascularized.    Clinical Impression  Pt admitted with above diagnosis. Pt currently with functional limitations due to the deficits listed below (see PT Problem List). Pt presents with decreased functional mobility secondary to balance and communication impairments. Displays expressive aphasia, able to consistently respond with yes/no questions, following all commands during session. Ambulating 150 feet with no assistive device and min guard assist. Pt gave me permission to contact his wife (they are currently separated); pt wife states she plans to have him live with her and be his caregiver upon discharge.   I have discussed the patient's current level of function related to stroke with the patient and patient wife.  They acknowledge understanding of this and feel they can provide the level of care the patient will need at home.          Follow Up Recommendations Outpatient PT;Supervision/Assistance - 24 hour    Equipment Recommendations  None recommended by PT    Recommendations for Other Services       Precautions / Restrictions Precautions Precautions: Fall;Other (comment) Precaution Comments: SBP 150-180 Restrictions Weight Bearing Restrictions: No      Mobility  Bed  Mobility Overal bed mobility: Modified Independent                Transfers Overall transfer level: Needs assistance Equipment used: None Transfers: Sit to/from Stand Sit to Stand: Min guard            Ambulation/Gait Ambulation/Gait assistance: Min guard Gait Distance (Feet): 150 Feet Assistive device: None Gait Pattern/deviations: Step-through pattern;Decreased stride length     General Gait Details: Close min guard for safety, increased cadence with difficulty controlling momentum at times. No overt LOB  Stairs            Wheelchair Mobility    Modified Rankin (Stroke Patients Only) Modified Rankin (Stroke Patients Only) Pre-Morbid Rankin Score: No symptoms Modified Rankin: Moderately severe disability     Balance Overall balance assessment: Needs assistance Sitting-balance support: Feet supported Sitting balance-Leahy Scale: Good Sitting balance - Comments: donning socks without assist   Standing balance support: No upper extremity supported;During functional activity Standing balance-Leahy Scale: Fair                               Pertinent Vitals/Pain Pain Assessment: Faces Faces Pain Scale: No hurt    Home Living Family/patient expects to be discharged to:: Private residence Living Arrangements: Spouse/significant other(separated from his wife) Available Help at Discharge: Family Type of Home: House Home Access: Stairs to enter   Technical brewer of Steps: 2 Home Layout: Able to live on main level with bedroom/bathroom   Additional Comments: Pt is currently separated from wife, but his wife states she plans to have him live with her and be his caregiver. Wife will be working from home.  Pt wife is a Automotive engineer for [employer].     Prior Function Level of Independence: Independent         Comments: works for [employer], been out since March      Hand Dominance        Extremity/Trunk Assessment   Upper Extremity  Assessment Upper Extremity Assessment: RUE deficits/detail;LUE deficits/detail RUE Deficits / Details: Strength 5/5 LUE Deficits / Details: Strength 5/5    Lower Extremity Assessment Lower Extremity Assessment: RLE deficits/detail;LLE deficits/detail RLE Deficits / Details: Strength 5/5 LLE Deficits / Details: Strength 5/5    Cervical / Trunk Assessment Cervical / Trunk Assessment: Normal  Communication   Communication: Expressive difficulties  Cognition Arousal/Alertness: Awake/alert Behavior During Therapy: WFL for tasks assessed/performed Overall Cognitive Status: Difficult to assess                                 General Comments: Following all commands. Displays expressive aphasia, difficulty with word finding. Able to consistently answer yes/no questions. Able to correctly name objects in room with cueing      General Comments  BP 110/73 upon sitting up; RN notified    Exercises     Assessment/Plan    PT Assessment Patient needs continued PT services  PT Problem List Decreased balance;Decreased mobility       PT Treatment Interventions Gait training;Stair training;Therapeutic activities;Functional mobility training;Therapeutic exercise;Balance training;Patient/family education    PT Goals (Current goals can be found in the Care Plan section)  Acute Rehab PT Goals Patient Stated Goal: unable; pt wife would like his speech to improve PT Goal Formulation: With patient/family Time For Goal Achievement: 03/23/19 Potential to Achieve Goals: Good    Frequency Min 4X/week   Barriers to discharge        Co-evaluation               AM-PAC PT "6 Clicks" Mobility  Outcome Measure Help needed turning from your back to your side while in a flat bed without using bedrails?: None Help needed moving from lying on your back to sitting on the side of a flat bed without using bedrails?: None Help needed moving to and from a bed to a chair (including a  wheelchair)?: A Little Help needed standing up from a chair using your arms (e.g., wheelchair or bedside chair)?: A Little Help needed to walk in hospital room?: A Little Help needed climbing 3-5 steps with a railing? : A Little 6 Click Score: 20    End of Session Equipment Utilized During Treatment: Gait belt Activity Tolerance: Patient tolerated treatment well Patient left: in bed;with call bell/phone within reach Nurse Communication: Mobility status PT Visit Diagnosis: Unsteadiness on feet (R26.81);Difficulty in walking, not elsewhere classified (R26.2)    Time: 9211-9417 PT Time Calculation (min) (ACUTE ONLY): 23 min   Charges:   PT Evaluation $PT Eval Moderate Complexity: 1 Mod PT Treatments $Therapeutic Activity: 8-22 mins       Ellamae Sia, PT, DPT Acute Rehabilitation Services Pager [phone] Office [phone]   Willy Eddy 03/09/2019, 2:09 PM

## 2019-03-09 NOTE — Sedation Documentation (Signed)
Anesthesia unable to establish art line, notified Dr Estanislado Pandy.  Per Dr Estanislado Pandy, no coags need to be drawn at this time.

## 2019-03-09 NOTE — Sedation Documentation (Signed)
Left femoral artery sheath placed by Dr. Estanislado Pandy, blood drawn, coags sent to lab

## 2019-03-09 NOTE — Consult Note (Addendum)
NAME:  Adrian [name], MRN:  [id number], DOB:  19-Aug-1955, LOS: 2 ADMISSION DATE:  03/07/2019, CONSULTATION DATE:  8/2 REFERRING MD:  Rory Percy - neuro, CHIEF COMPLAINT:  Relative hypotension s/p IR   Brief History   63 yo M s/p NIR (LICA not amenable for stent, LMCA no thrombectomy). SBP goal 150-180. Patient on neo at present   History of present illness   63 yo M PMH CAD, Afib, Diverticulitis who initially presented to [place] for abdominal pain, large intestinal abscess requiring ex lap, appendectomy  7/31.  Last known normal 03/08/2019 1930. It was then noticed that the patient was exhibiting signs of acute CVA including difficult speech and nonsensical speech. Code stroke was activated. Initial NIH score 2. Noncon head CT with hyperdense L M2 branch, patient was not candidate for TPA due to recent surgery.  CTA head and neck reveals L cervical carotid occlusion, L MCA occlusion. Patient transferred to [facility] for IR intervention. In IR: Attempted revascularization of occluded L ICA, not amenable for stent placement, LMCA flow TICI2b, no thrombectomy. Post IR, SBP goal is 150-180. Patient arrives to ICU extubated but continued on neo started by anesthesia.   PCCM consulted for BP management.   Past Medical History  CAD  Afib Diverticulitis, with abscess Hypothyroidism NICM   Significant Hospital Events   7/31 Ex lap, appendectomy  8/1 code stroke initiated transfer to [facility] from [facility], s/p NIR placed in ICU. On neo for SBP goal 150-180  Consults:  Neuro IR PCCM  Procedures:    Significant Diagnostic Tests:  8/1 CT Head [place]> Hyperdense L M2 branch  8/1 CT angio head neck> acute L ICU occlusion. Occluded L M2 branch   Micro Data:    Antimicrobials:    Interim history/subjective:  Transferred to NeuroICU following NIR   Objective   Blood pressure 137/69, pulse 66, temperature 97.8 F (36.6 C), resp. rate 18, height 5\' 8"  (1.727 m), weight 61.1 kg, SpO2 96 %.         Intake/Output Summary (Last 24 hours) at 03/09/2019 0351 Last data filed at 03/09/2019 0300 Gross per 24 hour  Intake 1050 ml  Output 1700 ml  Net -650 ml   Filed Weights   03/07/19 1815 03/08/19 0538  Weight: 59.9 kg 61.1 kg    Examination: General: WDWN adult male, supine in bed NAD  HENT: NCAT anicteric sclera, pink mmm, trachea midline  Lungs: CTA bilaterally. No accessory muscle use, symmetrical chest expansion Cardiovascular: Bradycardic rate regular rhythm.  s1s2 no rgm. Capillary refill brisk, < 3 seconds. Abdomen: Soft, ndnt.  Extremities: Symmetrical bulk and tone, no obvious joint abnormality, no cyanosis, no clubbing Neuro: Awakens to voice. Following commands. 5/5 strength BUE BLE. Poor speech  GU: no scrotal edema.  Skin: clean, dry, warm, without rash   Resolved Hospital Problem list     Assessment & Plan:   Hypotension/ relative hypotension  -S/p IR: LICA no stent placed, LMCA no thrombectomy -Suspect component of medication induced hypotension/normotension P SBP goal 150-180 per neurology At present patient is on neo, initiated by anesthesia 566ml LR bolus now then will start mIVF  Titrate peripheral pressors for SBP goal 150-180    Rest Per Primary  Best practice:  Diet: NPO  Pain/Anxiety/Delirium protocol (if indicated): na VAP protocol (if indicated): na DVT prophylaxis: on lovenox  GI prophylaxis: na Glucose control: na Mobility: Br Code Status: full Family Communication: per primary Disposition: ICU  Labs  CBC: Recent Labs  Lab 03/05/19 0944 03/08/19 0906  WBC 4.1 3.1*  HGB 15.1 12.8*  HCT 46.6 37.5*  MCV 94.0 92.1  PLT 168 124*    Basic Metabolic Panel: Recent Labs  Lab 03/05/19 0944 03/08/19 0906  NA 137 136  K 4.9 4.1  CL 101 102  CO2 28 27  GLUCOSE 110* 135*  BUN 14 10  CREATININE 0.70 0.68  CALCIUM 9.0 8.5*  MG  --  1.8   GFR: Estimated Creatinine Clearance: 81.7 mL/min (by C-G formula based on SCr of 0.68  mg/dL). Recent Labs  Lab 03/05/19 0944 03/08/19 0906  WBC 4.1 3.1*    Liver Function Tests: No results for input(s): AST, ALT, ALKPHOS, BILITOT, PROT, ALBUMIN in the last 168 hours. No results for input(s): LIPASE, AMYLASE in the last 168 hours. No results for input(s): AMMONIA in the last 168 hours.  ABG No results found for: PHART, PCO2ART, PO2ART, HCO3, TCO2, ACIDBASEDEF, O2SAT   Coagulation Profile: Recent Labs  Lab 03/09/19 0215  INR 1.3*    Cardiac Enzymes: No results for input(s): CKTOTAL, CKMB, CKMBINDEX, TROPONINI in the last 168 hours.  HbA1C: Hgb A1c MFr Bld  Date/Time Value Ref Range Status  03/05/2019 09:44 AM 5.6 4.8 - 5.6 % Final    Comment:    (NOTE) Pre diabetes:          5.7%-6.4% Diabetes:              >6.4% Glycemic control for   <7.0% adults with diabetes     CBG: Recent Labs  Lab 03/08/19 2053  GLUCAP 107*    Review of Systems:   Unable to obtain, patient with very poor speech   Past Medical History  He,  has a past medical history of AF (paroxysmal atrial fibrillation) ([place]) (11/29/2018), Atrial flutter ([place]) (10/12/2015), CAD (coronary artery disease), native coronary artery (12/06/2015), Colonic diverticular abscess, Diverticulitis, History of chemotherapy (2005), Hypothyroidism, NICM (nonischemic cardiomyopathy) ([place]) (10/14/2015), Paroxysmal atrial flutter ([place]), Radiation (NOv.3,2005-Dec. 15, 2005), and Tonsil cancer ([place]) (2005).   Surgical History    Past Surgical History:  Procedure Laterality Date  . APPENDECTOMY N/A 03/07/2019   Procedure: ROBOTIC ASSISTED APPENDECTOMY;  Surgeon: Michael Boston, MD;  [facility];  Service: General;  Laterality: N/A;  . CARDIAC CATHETERIZATION N/A 10/18/2015   Procedure: Left Heart Cath and Coronary Angiography;  Surgeon: Burnell Blanks, MD;  Location: [place] CV LAB;  Service: Cardiovascular;  Laterality: N/A;  . CARDIOVERSION N/A 11/24/2015   Procedure: CARDIOVERSION;  Surgeon: Josue Hector, MD;  Location: [place];  Service: Cardiovascular;  Laterality: N/A;  . CYSTOSCOPY WITH STENT PLACEMENT Bilateral 03/07/2019   Procedure: CYSTOSCOPY WITH BILATERAL FIREFLY INJECTION;  Surgeon: Ardis Hughs, MD;  [facility];  Service: Urology;  Laterality: Bilateral;  . GASTROSTOMY TUBE PLACEMENT  07/04/2004   IR - G tube for tonsilar cancer  . IR RADIOLOGIST EVAL & MGMT  12/18/2018  . LAMINECTOMY     C5/placement of steel plate  . NECK SURGERY  2003   replaced disk  . TEE WITHOUT CARDIOVERSION N/A 10/15/2015   Procedure: TRANSESOPHAGEAL ECHOCARDIOGRAM (TEE);  Surgeon: Larey Dresser, MD;  Location: [place];  Service: Cardiovascular;  Laterality: N/A;  . TEE WITHOUT CARDIOVERSION N/A 11/24/2015   Procedure: TRANSESOPHAGEAL ECHOCARDIOGRAM (TEE);  Surgeon: Josue Hector, MD;  Location: [place];  Service: Cardiovascular;  Laterality: N/A;  . XI ROBOTIC ASSISTED COLOSTOMY TAKEDOWN N/A 03/07/2019   Procedure: XI  ROBOTIC ASSISTED LOW ANTERIOR RESECTION, RIGID PROCTOSCOPY;  Surgeon: Michael Boston, MD;  [facility];  Service: General;  Laterality: N/A;     Social History   reports that he has been smoking cigarettes. He has a 10.00 pack-year smoking history. He has never used smokeless tobacco. He reports current alcohol use of about 12.0 standard drinks of alcohol per week. He reports that he does not use drugs.   Family History   His family history includes Cancer in his brother; Colon cancer in his father; Prostate cancer in his father; Skin cancer in his mother. There is no history of Esophageal cancer, Rectal cancer, or Stomach cancer.   Allergies No Known Allergies   Home Medications  Prior to Admission medications   Medication Sig Start Date End Date Taking? Authorizing Provider  apixaban (ELIQUIS) 5 MG TABS tablet Take 1 tablet (5 mg total) by mouth 2 (two) times daily. Patient not taking: Reported on 03/05/2019 02/08/18  Yes Turner, Eber Hong, MD   atorvastatin (LIPITOR) 40 MG tablet Take 1 tablet (40 mg total) by mouth daily. Please keep upcoming appt in September for future refills. Thank you Patient taking differently: Take 40 mg by mouth 3 (three) times a week.  03/01/18  Yes Imogene Burn, PA-C  levothyroxine (SYNTHROID) 125 MCG tablet TAKE 1 TABLET BY MOUTH EVERY DAY Patient taking differently: Take 125 mcg by mouth daily before breakfast.  12/16/18  Yes Burchette, Alinda Sierras, MD  nebivolol (BYSTOLIC) 10 MG tablet Take 1 tablet (10 mg total) by mouth daily. 05/21/18  Yes Turner, Eber Hong, MD  acetaminophen (TYLENOL) 325 MG tablet Take 2 tablets (650 mg total) by mouth every 6 (six) hours as needed for mild pain (or Fever >/= 101). Patient not taking: Reported on 03/06/2019 12/04/18   Eugenie Filler, MD  Multiple Vitamin (MULTIVITAMIN WITH MINERALS) TABS tablet Take 1 tablet by mouth daily. Patient not taking: Reported on 02/27/2019 12/05/18   Eugenie Filler, MD  traMADol (ULTRAM) 50 MG tablet Take 1-2 tablets (50-100 mg total) by mouth every 6 (six) hours as needed for moderate pain or severe pain. 03/07/19   Michael Boston, MD     Critical care time: 35 min    Seen at bedside with NP Bowser, agree with assessment and plan.   Eliseo Gum MSN, AGACNP-BC [place] [phone] If no answer, [phone] 03/09/2019, 3:51 AM

## 2019-03-09 NOTE — Evaluation (Signed)
Clinical/Bedside Swallow Evaluation Patient Details  Name: Adrian [name] MRN: [id number] Date of Birth: July 28, 1956  Today's Date: 03/09/2019 Time: SLP Start Time (ACUTE ONLY): 66 SLP Stop Time (ACUTE ONLY): 1545 SLP Time Calculation (min) (ACUTE ONLY): 15 min  Past Medical History:  Past Medical History:  Diagnosis Date  . AF (paroxysmal atrial fibrillation) ([place]) 11/29/2018  . Atrial flutter ([place]) 10/12/2015  . CAD (coronary artery disease), native coronary artery 12/06/2015   a. 10/2015 MV: EF  37%, reversible defect inferior apex, intermediate risk findings; b. 10/2015 Cath: 20% mid RCA.  . Colonic diverticular abscess   . Diverticulitis   . History of chemotherapy 2005   Cisplatin  . Hypothyroidism   . NICM (nonischemic cardiomyopathy) ([place]) 10/14/2015   a. Tachy mediated?;  b. Echo 3/17 - Mild concentric LVH, EF 30-35%, anteroseptal, anterior, anterolateral, apical anterior, lateral hypokinesis, trivial MR, mild to moderately reduced RVSF; c. LHC 3/17 - mRCA 20%  . Paroxysmal atrial flutter ([place])    a. TEE 3/17 with ? LAA clot-->s/p TEE/DCCV 11/24/2015  . Radiation NOv.3,2005-Dec. 15, 2005   6810 cGy in 30 fractions  . Tonsil cancer [facility]) 2005   Dr Romeo Rabon.  XRT   Past Surgical History:  Past Surgical History:  Procedure Laterality Date  . APPENDECTOMY N/A 03/07/2019   Procedure: ROBOTIC ASSISTED APPENDECTOMY;  Surgeon: Michael Boston, MD;  [facility];  Service: General;  Laterality: N/A;  . CARDIAC CATHETERIZATION N/A 10/18/2015   Procedure: Left Heart Cath and Coronary Angiography;  Surgeon: Burnell Blanks, MD;  Location: [place] CV LAB;  Service: Cardiovascular;  Laterality: N/A;  . CARDIOVERSION N/A 11/24/2015   Procedure: CARDIOVERSION;  Surgeon: Josue Hector, MD;  Location: [place];  Service: Cardiovascular;  Laterality: N/A;  . CYSTOSCOPY WITH STENT PLACEMENT Bilateral 03/07/2019   Procedure: CYSTOSCOPY WITH BILATERAL FIREFLY INJECTION;  Surgeon:  Ardis Hughs, MD;  [facility];  Service: Urology;  Laterality: Bilateral;  . GASTROSTOMY TUBE PLACEMENT  07/04/2004   IR - G tube for tonsilar cancer  . IR RADIOLOGIST EVAL & MGMT  12/18/2018  . LAMINECTOMY     C5/placement of steel plate  . NECK SURGERY  2003   replaced disk  . TEE WITHOUT CARDIOVERSION N/A 10/15/2015   Procedure: TRANSESOPHAGEAL ECHOCARDIOGRAM (TEE);  Surgeon: Larey Dresser, MD;  Location: [place];  Service: Cardiovascular;  Laterality: N/A;  . TEE WITHOUT CARDIOVERSION N/A 11/24/2015   Procedure: TRANSESOPHAGEAL ECHOCARDIOGRAM (TEE);  Surgeon: Josue Hector, MD;  Location: [facility];  Service: Cardiovascular;  Laterality: N/A;  . XI ROBOTIC ASSISTED COLOSTOMY TAKEDOWN N/A 03/07/2019   Procedure: XI ROBOTIC ASSISTED LOW ANTERIOR RESECTION, RIGID PROCTOSCOPY;  Surgeon: Michael Boston, MD;  [facility];  Service: General;  Laterality: N/A;   HPI:  Adrian [name] is a 63 y.o. male coronary artery disease, tonsillar ca in remission, atrial fibrillation on anticoagulation which was held for surgery since last Tuesday, transferred emergently from [facility] long hospital where he was admitted for diverticulitis of the large intestine with abscess requiring exploratory laparotomy, low anterior resection rigid proctoscopy, appendectomy, last seen normal at 7:30 PM on 03/08/2019 at [facility] when it was noticed that he had difficulty with speech- speech did not make sense. CTA head and neck was also done which revealed a left cervical carotid occlusion as well as left MCA occlusion. S/P bilateral common carotid and RT vertbral arteriograms followed by attempted revascularization of occluded Lt ICA prox .MCA revascularization via collaterals  TICI 2b to 2C . MRI shows Restricted diffusion in the left MCA middle to anterior division territory involving most of the insula, left operculum cortex, cortex of the left inferior frontal gyrus and tracking cephalad to the  left middle frontal gyrus. Pt passed Yale swallow assessment but was then noted to cough when taking water and pills. RN notified SLP of concern.    Assessment / Plan / Recommendation Clinical Impression  Pt demonstrates signs of a neuromuscular dypshagia resulting in possible aspiration of thin liquids. He is particulalry noted to have trouble achieving both volitional and involuntary cough , raising concern for mechanism protecting the airway. When given thin liquids there is immediate and delayed weak congested coughing. Trials of puree are better tolerated, but still elicit multiple swallows. Recommend pt be NPO except for necessary meds whole in puree. WIll f/u for objective assessment of swallowing tomorrow.  SLP Visit Diagnosis: Dysphagia, oropharyngeal phase (R13.12)    Aspiration Risk  Severe aspiration risk    Diet Recommendation NPO   Medication Administration: Whole meds with puree    Other  Recommendations     Follow up Recommendations Inpatient Rehab      Frequency and Duration            Prognosis        Swallow Study   General HPI: Adrian [name] is a 63 y.o. male coronary artery disease, tonsillar ca in remission, atrial fibrillation on anticoagulation which was held for surgery since last Tuesday, transferred emergently from [facility] long hospital where he was admitted for diverticulitis of the large intestine with abscess requiring exploratory laparotomy, low anterior resection rigid proctoscopy, appendectomy, last seen normal at 7:30 PM on 03/08/2019 at [facility] long hospital when it was noticed that he had difficulty with speech- speech did not make sense. CTA head and neck was also done which revealed a left cervical carotid occlusion as well as left MCA occlusion. S/P bilateral common carotid and RT vertbral arteriograms followed by attempted revascularization of occluded Lt ICA prox .MCA revascularization via collaterals TICI 2b to 2C . MRI shows Restricted diffusion in  the left MCA middle to anterior division territory involving most of the insula, left operculum Type of Study: Bedside Swallow Evaluation Diet Prior to this Study: Regular;Thin liquids Temperature Spikes Noted: No Respiratory Status: Room air History of Recent Intubation: No Behavior/Cognition: Alert;Cooperative;Pleasant mood Oral Cavity Assessment: Within Functional Limits Oral Care Completed by SLP: No Oral Cavity - Dentition: Adequate natural dentition Vision: Functional for self-feeding Self-Feeding Abilities: Able to feed self Patient Positioning: Upright in bed Baseline Vocal Quality: Normal Volitional Cough: Congested;Weak Volitional Swallow: Able to elicit    Oral/Motor/Sensory Function Overall Oral Motor/Sensory Function: Moderate impairment Facial ROM: Reduced right;Suspected CN VII (facial) dysfunction Facial Symmetry: Abnormal symmetry right;Suspected CN VII (facial) dysfunction Facial Strength: Reduced right;Suspected CN VII (facial) dysfunction Lingual ROM: Reduced right;Suspected CN XII (hypoglossal) dysfunction Lingual Symmetry: Abnormal symmetry right;Suspected CN XII (hypoglossal) dysfunction Lingual Strength: Within Functional Limits Mandible: Within Functional Limits   Ice Chips Ice chips: Impaired Presentation: Spoon Pharyngeal Phase Impairments: Cough - Delayed   Thin Liquid Thin Liquid: Impaired Presentation: Straw Pharyngeal  Phase Impairments: Cough - Immediate;Cough - Delayed;Multiple swallows    Nectar Thick Nectar Thick Liquid: Not tested   Honey Thick Honey Thick Liquid: Not tested   Puree Puree: Impaired Presentation: Spoon Pharyngeal Phase Impairments: Multiple swallows   Solid     Solid: Not tested     Herbie Baltimore, MA CCC-SLP  Acute Rehabilitation Services Pager [phone] Office [phone]  Lynann Beaver 03/09/2019,4:15 PM

## 2019-03-09 NOTE — Progress Notes (Signed)
Blood pressure goal 130-150 per neurology.

## 2019-03-09 NOTE — Consult Note (Signed)
INR. Post procedure CT Brain reveals no ICH or mass effect. Extubated . Obeying simple commands. Moves all 4s. Poor speech output. Both groins soft. Distal pulses palpable. 26F angioseal closure device in the RT groin. S.Tylen Leverich MD

## 2019-03-09 NOTE — Progress Notes (Addendum)
Assessed patient at 51. Neuro was WNL, pt was alert and oriented x4 with no neuro symptoms. At 2039 patient's wife called and stated she felt something was wrong with the patient. Upon reassessment, the patient's speech was slurred, he had expressive aphasia and had a Rt sided facial droop. Immediately called rapid response while paging Dr Harlow Asa. The rapid response nurse did a full neuro assessment and followed protocol getting the patient down to CT. Communicated with Dr Harlow Asa and tele-neuro to determine a plan. See further notes. Report given to carelink x2. Report also given to [place].

## 2019-03-09 NOTE — Transfer of Care (Signed)
Immediate Anesthesia Transfer of Care Note  Patient: Adrian [name]  Procedure(s) Performed: IR WITH ANESTHESIA (N/A )  Patient Location: ICU  Anesthesia Type:General  Level of Consciousness: awake and alert   Airway & Oxygen Therapy: Patient Spontanous Breathing and Patient connected to face mask oxygen  Post-op Assessment: Report given to RN, Post -op Vital signs reviewed and stable and Patient moving all extremities X 4.  Tongue mildly deviated, aphasia worsened from pre-op.  Follows commands with all 4 extremeties.  Post vital signs: Reviewed and stable  Last Vitals:  Vitals Value Taken Time  BP 137/69 03/09/19 0331  Temp    Pulse 58 03/09/19 0340  Resp 16 03/09/19 0340  SpO2 95 % 03/09/19 0340  Vitals shown include unvalidated device data.  Last Pain:  Vitals:   03/08/19 2024  TempSrc: Oral  PainSc:       Patients Stated Pain Goal: 2 (28/11/88 6773)  Complications: No apparent anesthesia complications

## 2019-03-09 NOTE — Sedation Documentation (Signed)
Notified Dr. Estanislado Pandy no coags avail.  Order received to send coags once anesthesia establishes an art line.

## 2019-03-09 NOTE — Anesthesia Procedure Notes (Signed)
Procedure Name: Intubation Date/Time: 03/09/2019 12:35 AM Performed by: Suzy Bouchard, CRNA Pre-anesthesia Checklist: Patient identified, Emergency Drugs available, Suction available, Patient being monitored and Timeout performed Patient Re-evaluated:Patient Re-evaluated prior to induction Oxygen Delivery Method: Circle system utilized Preoxygenation: Pre-oxygenation with 100% oxygen Induction Type: IV induction, Cricoid Pressure applied and Rapid sequence Laryngoscope Size: Miller and 2 Grade View: Grade I Tube type: Oral Tube size: 7.5 mm Number of attempts: 1 Airway Equipment and Method: Stylet Placement Confirmation: positive ETCO2,  ETT inserted through vocal cords under direct vision and breath sounds checked- equal and bilateral Secured at: 24 cm Tube secured with: Tape Dental Injury: Teeth and Oropharynx as per pre-operative assessment

## 2019-03-09 NOTE — Sedation Documentation (Signed)
Left fem sheath pulled, manual pressure held by IR tech [employer]

## 2019-03-09 NOTE — Progress Notes (Signed)
ANTICOAGULATION CONSULT NOTE - Consult  Pharmacy Consult for Heparin Indication: atrial fibrillation and stroke  No Known Allergies  Patient Measurements: Height: 5\' 8"  (172.7 cm) Weight: 140 lb 10.5 oz (63.8 kg) IBW/kg (Calculated) : 68.4 Heparin Dosing Weight: 63.8 kg  Vital Signs: Temp: 98.1 F (36.7 C) (08/02 2031) Temp Source: Oral (08/02 2031) BP: 130/79 (08/02 2230) Pulse Rate: 60 (08/02 2230)  Labs: Recent Labs    03/08/19 0906 03/09/19 0215 03/09/19 0656 03/09/19 1429 03/09/19 2127  HGB 12.8*  --  12.5*  --   --   HCT 37.5*  --  36.5*  --   --   PLT 124*  --  125*  --   --   APTT  --  >200*  --  39* 55*  LABPROT  --  15.7*  --   --   --   INR  --  1.3*  --   --   --   HEPARINUNFRC  --   --   --  0.17* 0.13*  CREATININE 0.68  --  0.70  --   --     Estimated Creatinine Clearance: 85.3 mL/min (by C-G formula based on SCr of 0.7 mg/dL).   Medical History: Past Medical History:  Diagnosis Date  . AF (paroxysmal atrial fibrillation) ([place]) 11/29/2018  . Atrial flutter ([place]) 10/12/2015  . CAD (coronary artery disease), native coronary artery 12/06/2015   a. 10/2015 MV: EF  37%, reversible defect inferior apex, intermediate risk findings; b. 10/2015 Cath: 20% mid RCA.  . Colonic diverticular abscess   . Diverticulitis   . History of chemotherapy 2005   Cisplatin  . Hypothyroidism   . NICM (nonischemic cardiomyopathy) ([place]) 10/14/2015   a. Tachy mediated?;  b. Echo 3/17 - Mild concentric LVH, EF 30-35%, anteroseptal, anterior, anterolateral, apical anterior, lateral hypokinesis, trivial MR, mild to moderately reduced RVSF; c. LHC 3/17 - mRCA 20%  . Paroxysmal atrial flutter ([place])    a. TEE 3/17 with ? LAA clot-->s/p TEE/DCCV 11/24/2015  . Radiation NOv.3,2005-Dec. 15, 2005   6810 cGy in 30 fractions  . 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  XRT    Assessment: 63 year old male on Eliquis prior to admission for atrial fibrillation. Patient was admitted to [facility]  long on 7/31 for robot assisted low anterior resection and appendectomy and Eliquis had been held for surgery - last dose on 7/28. POD#1 patient began to experience stroke symptoms and L-MCA stroke noted on CT. Patient was transferred to [facility] for IR intervention. Attempted mechanical thrombectomy was unsuccessful and carotid artery was not amenable to stent. Pharmacy consulted to dose heparin. CBC and Scr stable.  Baseline HL 0.17, will follow APTT until HL is correlating  HL 0.13  APTT 55 - subtherapeutic   Goal of Therapy:  Heparin level 0.3 to 0.5 units/ml aPTT 66-85 seconds Monitor platelets by anticoagulation protocol: Yes   Plan:  Increase Heparin to 900 units/hr Check APTT/HL in 6 hours  Monitor for bleeding and mental status changes.    Lorel Monaco, PharmD PGY1 Ambulatory Care Resident [employer] # [phone]

## 2019-03-09 NOTE — Progress Notes (Signed)
STROKE TEAM PROGRESS NOTE   HISTORY OF PRESENT ILLNESS (per record) Adrian [name] is a 63 y.o. male coronary artery disease, tonsillar ca in remission, atrial fibrillation on anticoagulation which was held for surgery since last Tuesday, transferred emergently from [facility] long hospital where he was admitted for diverticulitis of the large intestine with abscess requiring exploratory laparotomy, low anterior resection rigid proctoscopy, appendectomy, last seen normal at 7:30 PM on 03/08/2019 at [facility] when it was noticed that he had difficulty with speech- speech did not make sense. He was having difficulty expressing himself.  A stroke alert was activated and patient was evaluated by telemedicine neurology.  His NIH was 2 on their assessment.  Noncontrast CT of the head was negative for bleed however TPA was not offered due to the recent surgery as he is postop day 1 from the surgery. CTA head and neck was also done which revealed a left cervical carotid occlusion as well as left MCA occlusion, with likelihood that he might be amenable for IR intervention. The patient was transferred over to the ICU at [facility] without notification of neurology services at this facility. I was notified by the phone with ICU RN receiving the patient upon patient's arrival that the patient has arrived.  I had to review the chart, speak with the telemetry neurologist over the phone as well as speak with the primary service to gather more history. I took him immediately down to the CT scanner for a CT perfusion study, which showed a favorable profile for thrombectomy.  Case was discussed with Dr. Estanislado Pandy from interventional neuroradiology who agreed to proceed with the procedure after we obtained a consent on a three-way call with the patient's wife over the phone.    LKW: 7:30 PM on 03/08/2019 tpa given?: no, exploratory laparotomy done 1 day ago Premorbid modified Rankin scale (mRS): 0 ROS: ROS  was performed and is negative except as noted in the HPI.   SUBJECTIVE (INTERVAL HISTORY) I have personally reviewed history of presenting illness with the patient in details and reviewed electronic medical records and imaging films in PACS.  Patient has remained neurologically stable with persistent aphasia and dysarthria.  Attempted mechanical thrombectomy was unsuccessful and carotid artery also could not be revascularized.  Patient is on low-dose Neo-SynephrineTo  keep induced hypertension    OBJECTIVE Vitals:   03/09/19 0700 03/09/19 0800 03/09/19 0900 03/09/19 1000  BP: (!) 156/80 (!) 155/142 (!) 149/72 (!) 160/79  Pulse: 68 64 65 (!) 57  Resp: (!) 21 16 17 17   Temp:  98.5 F (36.9 C)    TempSrc:  Oral    SpO2: 95% 92% 93% 92%  Weight:      Height:        CBC:  Recent Labs  Lab 03/08/19 0906 03/09/19 0656  WBC 3.1* 5.0  NEUTROABS  --  0.9*  HGB 12.8* 12.5*  HCT 37.5* 36.5*  MCV 92.1 91.9  PLT 124* 125*    Basic Metabolic Panel:  Recent Labs  Lab 03/08/19 0906 03/09/19 0656  NA 136 139  K 4.1 3.8  CL 102 104  CO2 27 25  GLUCOSE 135* 98  BUN 10 7*  CREATININE 0.68 0.70  CALCIUM 8.5* 8.4*  MG 1.8  --     Lipid Panel:     Component Value Date/Time   CHOL 110 03/09/2019 0656   CHOL 159 03/09/2017 1032   TRIG 97 03/09/2019 0656   HDL 45 03/09/2019 0656  HDL 61 03/09/2017 1032   CHOLHDL 2.4 03/09/2019 0656   VLDL 19 03/09/2019 0656   LDLCALC 46 03/09/2019 0656   LDLCALC 66 03/09/2017 1032   HgbA1c:  Lab Results  Component Value Date   HGBA1C 5.5 03/09/2019   Urine Drug Screen: No results found for: LABOPIA, COCAINSCRNUR, LABBENZ, AMPHETMU, THCU, LABBARB  Alcohol Level No results found for: ETH  IMAGING  Ct Angio Head W Or Wo Contrast Ct Angio Neck W Or Wo Contrast 03/08/2019 IMPRESSION:  Acute occlusion of the left ICA at its origin. Reconstitution of the ICA by collaterals at the skull base level. Occluded left M2 branch.   MRI Head WO  Contrast - pending 03/09/19   Ct Cerebral Perfusion W Contrast 03/08/2019 IMPRESSION:  Acute core infarct involving the anterior left MCA distribution with moderate surrounding ischemic penumbra as above.   Ct Head Code Stroke Wo Contrast 03/08/2019 IMPRESSION:  1. Normal appearance of the brain itself. However, there is a definite hyperdense left M2 branch consistent with an acute embolus. Right frontal sinusitis.  2. ASPECTS is 10.   Interventional Radiology 03/09/2019 S/P bilateral common carotid and RT vertbral arteriograms followed by attempted revascularization of occluded Lt ICA prox . MCA revascularization via collaterals TICI 2b to 2C .   Transthoracic Echocardiogram  8/2//2020 [name]  1. The left ventricle has normal systolic function with an ejection fraction of 60-65%. The cavity size was normal. Left ventricular diastolic parameters were normal.  2. The right ventricle has normal systolic function. The cavity was normal. There is no increase in right ventricular wall thickness.  3. No evidence of mitral valve stenosis.  4. The tricuspid valve is grossly normal.  5. No stenosis of the aortic valve.  6. The aorta is normal in size and structure.  7. The aortic root and ascending aorta are normal in size and structure.  8. The interatrial septum was not assessed.   ECG  - not ordered   PHYSICAL EXAM Blood pressure (!) 160/79, pulse (!) 57, temperature 98.5 F (36.9 C), temperature source Oral, resp. rate 17, height 5\' 8"  (1.727 m), weight 63.8 kg, SpO2 92 %. Pleasant middle-aged Caucasian male not in distress. . Afebrile. Head is nontraumatic. Neck is supple without bruit.    Cardiac exam no murmur or gallop. Lungs are clear to auscultation. Distal pulses are well felt. Neurological Exam :  Awake alert oriented to time place and person.  Moderate expressive aphasia with word finding difficulties and paraphasic errors.  Good comprehension and follows commands well.   Poor naming and repetition.  Dysarthria present.  Tries to speak occasional words but unable to speak sentences.  Mild right lower facial weakness.  Tongue midline.  Extraocular movements full range without nystagmus.  Blinks to threat bilaterally.  Motor system exam symmetric upper and lower extremity strength no focal weakness.  Fine finger movements are slightly diminished on the right.  Orbits left over right upper extremity.  Sensation appears preserved bilaterally.  Gait not tested.    ASSESSMENT/PLAN Mr. LATEEF JUNCAJ is a 63 y.o. male with history of coronary artery disease, ongoing tobacco use, hx of NICM, tonsillar ca in remission, atrial fibrillation on anticoagulation which was held for surgery since last Tuesday - Surgery [facility] 0n 03/06/19 - diverticulitis of the large intestine with abscess requiring exploratory laparotomy, low anterior resection rigid proctoscopy, appendectomy. Post op the pt developed speech difficulties. He did not receive IV t-PA due to recent surgery.  Interventional Radiology 03/09/2019 S/P  bilateral common carotid and RT vertbral arteriograms followed by attempted revascularization of occluded Lt ICA prox . MCA revascularization via collaterals TICI 2b to 2C .  Stroke:  Left MCA infarct - occluded left ICA  Resultant moderate expressive aphasia and dysarthria  CT head - Normal appearance of the brain itself. However, there is a definite hyperdense left M2 branch consistent with an acute embolus  MRI head - pending  MRA head  - not ordered  CTA H&N - Acute occlusion of the left ICA at its origin. Reconstitution of the ICA by collaterals at the skull base level. Occluded left M2 branch.  CT Perfusion - Acute core infarct involving the anterior left MCA distribution with moderate surrounding ischemic penumbra   Carotid Doppler - CTA neck performed - carotid dopplers not indicated.  2D Echo  - EF 60 - 65%. No cardiac source of emboli identified.   Sars [place]ona  Virus 2 - negative  LDL - 46  HgbA1c - 5.5  UDS - not ordered  VTE prophylaxis - Lovenox  Diet - NPO  Eliquis (apixaban) daily prior to admission, now on No antithrombotic  Patient counseled to be compliant with his antithrombotic medications  Ongoing aggressive stroke risk factor management  Therapy recommendations:  pending  Disposition:  Pending  Hypertension  Stable . Permissive hypertension (OK if < 220/120) but gradually normalize in 5-7 days . Long-term BP goal normotensive  Hyperlipidemia  Lipid lowering medication PTA:  Lipitor 40 mg daily  LDL 46, goal < 70  Current lipid lowering medication:  Lipitor 40 mg daily  Continue statin at discharge  Other Stroke Risk Factors  Advanced age  Cigarette smoker - advised to stop smoking  ETOH use, advised to drink no more than 1 alcoholic beverage per day.  Coronary artery disease  Other Active Problems  Mild Thrombocytopenia - 125  S/P exploratory laparotomy, low anterior resection rigid proctoscopy, appendectomy 03/06/19  PLAN  MRI today   Hospital day # 2  I have personally obtained history,examined this patient, reviewed notes, independently viewed imaging studies, participated in medical decision making and plan of care.ROS completed by me personally and pertinent positives fully documented  I have made any additions or clarifications directly to the above note.  Plan maintain induced hypertension but change systolic blood pressure goal to systolic greater than 786 and wean Neo-Synephrine drip.  Start IV fluids.  Speech therapy to check swallow eval.  Check MRI scan of the brain later today and if infarct is not too big or hemorrhagic start IV heparin drip for his A. fib.  Discussed with Dr. [place] Silos, Chase Caller and patient's wife and answered questions. This patient is critically ill and at significant risk of neurological worsening, death and care requires constant monitoring of vital signs,  hemodynamics,respiratory and cardiac monitoring, extensive review of multiple databases, frequent neurological assessment, discussion with family, other specialists and medical decision making of high complexity.I have made any additions or clarifications directly to the above note.This critical care time does not reflect procedure time, or teaching time or supervisory time of PA/NP/Med Resident etc but could involve care discussion time.  I spent 30 minutes of neurocritical care time  in the care of  this patient.     Antony Contras, MD Medical Director [place] Pager: [phone] 03/09/2019 12:32 PM   To contact Stroke Continuity provider, please refer to [email or web address] After hours, contact General Neurology

## 2019-03-09 NOTE — Progress Notes (Signed)
   Patient on neo for stroke at 15mcg  D/w DR SEthi 0- neuro will monitor this  Ccm will siugn off    SIGNATURE    Dr. Brand Males, M.D., F.C.C.P,  Pulmonary and Critical Care Medicine Staff Physician, [place] Director - Interstitial Lung Disease  Program  Pulmonary [place] at [place], [place], [address]  Pager: [phone], If no answer or between  15:00h - 7:00h: call [phone] Telephone: [phone]  12:10 PM 03/09/2019

## 2019-03-09 NOTE — Progress Notes (Signed)
  Echocardiogram 2D Echocardiogram has been performed.  Adrian [name] 03/09/2019, 10:45 AM

## 2019-03-09 NOTE — Progress Notes (Signed)
[facility] Progress Note Patient Name: Adrian [name] DOB: May 22, 1956 MRN: [id number]   Date of Service  03/09/2019  HPI/Events of Note  75 M history of atrial fibrillation on apixaban placed on hold since 7/29, recurrent rectosigmoid diverticulitis with development of contained abscess requiring placement of drain which has been removed 12/18/18. Underwent LAR 03/07/2019. Code stroke called last night. Attempted IR revascularization of occluded left proximal ICA unsuccessful. Now transferred to ICU extubated in IR.  eICU Interventions  Phenylephrine already started by anesthesia to maintain SBP 150-180 for perfuse collateral circulation.     Intervention Category Evaluation Type: New Patient Evaluation  Judd Lien 03/09/2019, 3:31 AM

## 2019-03-09 NOTE — Procedures (Signed)
S/P bilateral common carotid and RT vertbral arteriograms followed by attempted revascularization of occluded Lt ICA prox . MCA revascularization via collaterals TICI 2b to 2C . S.Dama Hedgepeth ND

## 2019-03-09 NOTE — Sedation Documentation (Signed)
Report given to Sam RN and PACU RN.  Neuro assessment completed.  Left and Right groin site assessed.  BLE pulses assessed.  Patient care transferred to [facility] and PACU RN

## 2019-03-09 NOTE — Progress Notes (Signed)
1 Day Post-Op   Subjective/Chief Complaint: Expressive aphasia   Objective: Vital signs in last 24 hours: Temp:  [97.8 F (36.6 C)-98.5 F (36.9 C)] 98.5 F (36.9 C) (08/02 0800) Pulse Rate:  [52-72] 64 (08/02 0800) Resp:  [15-22] 16 (08/02 0800) BP: (92-169)/(48-142) 155/142 (08/02 0800) SpO2:  [66 %-99 %] 92 % (08/02 0800) Weight:  [63.8 kg] 63.8 kg (08/02 0413) Last BM Date: 03/08/19  Intake/Output from previous day: 08/01 0701 - 08/02 0700 In: 1836.9 [P.O.:300; I.V.:1036.8; IV Piggyback:500.1] Out: 1450 [Urine:1400; Blood:50] Intake/Output this shift: Total I/O In: 297.1 [I.V.:297.1] Out: 1400 [Urine:1400]  General appearance: cooperative Resp: clear after cough Cardio: regular rate and rhythm GI: soft, one port site with blood under dressing - changed and looks OK, some BS Neurologic: Mental status: alert, expressive aphasia Motor: MAE and F/C  Lab Results:  Recent Labs    03/08/19 0906 03/09/19 0656  WBC 3.1* 5.0  HGB 12.8* 12.5*  HCT 37.5* 36.5*  PLT 124* 125*   BMET Recent Labs    03/08/19 0906 03/09/19 0656  NA 136 139  K 4.1 3.8  CL 102 104  CO2 27 25  GLUCOSE 135* 98  BUN 10 7*  CREATININE 0.68 0.70  CALCIUM 8.5* 8.4*   PT/INR Recent Labs    03/09/19 0215  LABPROT 15.7*  INR 1.3*   ABG No results for input(s): PHART, HCO3 in the last 72 hours.  Invalid input(s): PCO2, PO2  Studies/Results: Ct Angio Head W Or Wo Contrast  Result Date: 03/08/2019 CLINICAL DATA:  Expressive aphasia.  Left hyperdense MCA at head CT. EXAM: CT ANGIOGRAPHY HEAD AND NECK TECHNIQUE: Multidetector CT imaging of the head and neck was performed using the standard protocol during bolus administration of intravenous contrast. Multiplanar CT image reconstructions and MIPs were obtained to evaluate the vascular anatomy. Carotid stenosis measurements (when applicable) are obtained utilizing NASCET criteria, using the distal internal carotid diameter as the  denominator. CONTRAST:  177mL OMNIPAQUE IOHEXOL 350 MG/ML SOLN COMPARISON:  Head CT earlier same day. FINDINGS: CTA NECK FINDINGS Aortic arch: Mild aortic atherosclerosis. No aneurysm or dissection. Left vertebral artery arises directly from the arch. Right carotid system: Common carotid artery widely patent to the bifurcation. Mild atherosclerotic disease at carotid bifurcation but no stenosis or irregularity. Cervical ICA widely patent. Left carotid system: Common carotid artery is widely patent to the bifurcation. Advanced atherosclerotic disease at the carotid bifurcation with soft and calcified plaque and occlusion of the ICA at the bulb. No reconstituted flow in the cervical region. Small amount of reconstituted flow in the carotid canal. Vertebral arteries: Both vertebral artery origins are widely patent. Left vertebral artery arises from the arch as noted above. Both vertebral arteries widely patent through the cervical region. Skeleton: Cervical spondylosis.  Previous ACDF C5-6. Other neck: No mass or adenopathy. Upper chest: Pleural and parenchymal scarring at both lung apices. Review of the MIP images confirms the above findings CTA HEAD FINDINGS Anterior circulation: Right internal carotid artery is widely patent through the skull base and siphon region. Mild siphon atherosclerotic calcification. Right anterior and middle cerebral arteries are widely patent. Left internal carotid artery shows mild reconstituted flow in the carotid canal and flow in the siphon region. Siphon atherosclerotic calcification. Left anterior cerebral artery is widely patent. Left M1 segment is widely patent. Occluded left M2 branch as suggested by noncontrast CT. Posterior circulation: Both vertebral arteries are widely patent to the basilar. No basilar stenosis. Posterior circulation branch vessels are patent.  Venous sinuses: Patent and normal. Anatomic variants: None significant. Review of the MIP images confirms the above  findings IMPRESSION: Acute occlusion of the left ICA at its origin. Reconstitution of the ICA by collaterals at the skull base level. Occluded left M2 branch. These results were called by telephone at the time of interpretation on 03/08/2019 at 9:55 pm to Dr. Armandina Gemma , who verbally acknowledged these results. Electronically Signed   By: Nelson Chimes M.D.   On: 03/08/2019 22:00   Ct Angio Neck W Or Wo Contrast  Result Date: 03/08/2019 CLINICAL DATA:  Expressive aphasia.  Left hyperdense MCA at head CT. EXAM: CT ANGIOGRAPHY HEAD AND NECK TECHNIQUE: Multidetector CT imaging of the head and neck was performed using the standard protocol during bolus administration of intravenous contrast. Multiplanar CT image reconstructions and MIPs were obtained to evaluate the vascular anatomy. Carotid stenosis measurements (when applicable) are obtained utilizing NASCET criteria, using the distal internal carotid diameter as the denominator. CONTRAST:  110mL OMNIPAQUE IOHEXOL 350 MG/ML SOLN COMPARISON:  Head CT earlier same day. FINDINGS: CTA NECK FINDINGS Aortic arch: Mild aortic atherosclerosis. No aneurysm or dissection. Left vertebral artery arises directly from the arch. Right carotid system: Common carotid artery widely patent to the bifurcation. Mild atherosclerotic disease at carotid bifurcation but no stenosis or irregularity. Cervical ICA widely patent. Left carotid system: Common carotid artery is widely patent to the bifurcation. Advanced atherosclerotic disease at the carotid bifurcation with soft and calcified plaque and occlusion of the ICA at the bulb. No reconstituted flow in the cervical region. Small amount of reconstituted flow in the carotid canal. Vertebral arteries: Both vertebral artery origins are widely patent. Left vertebral artery arises from the arch as noted above. Both vertebral arteries widely patent through the cervical region. Skeleton: Cervical spondylosis.  Previous ACDF C5-6. Other neck: No  mass or adenopathy. Upper chest: Pleural and parenchymal scarring at both lung apices. Review of the MIP images confirms the above findings CTA HEAD FINDINGS Anterior circulation: Right internal carotid artery is widely patent through the skull base and siphon region. Mild siphon atherosclerotic calcification. Right anterior and middle cerebral arteries are widely patent. Left internal carotid artery shows mild reconstituted flow in the carotid canal and flow in the siphon region. Siphon atherosclerotic calcification. Left anterior cerebral artery is widely patent. Left M1 segment is widely patent. Occluded left M2 branch as suggested by noncontrast CT. Posterior circulation: Both vertebral arteries are widely patent to the basilar. No basilar stenosis. Posterior circulation branch vessels are patent. Venous sinuses: Patent and normal. Anatomic variants: None significant. Review of the MIP images confirms the above findings IMPRESSION: Acute occlusion of the left ICA at its origin. Reconstitution of the ICA by collaterals at the skull base level. Occluded left M2 branch. These results were called by telephone at the time of interpretation on 03/08/2019 at 9:55 pm to Dr. Armandina Gemma , who verbally acknowledged these results. Electronically Signed   By: Nelson Chimes M.D.   On: 03/08/2019 22:00   Ct Cerebral Perfusion W Contrast  Result Date: 03/08/2019 CLINICAL DATA:  Initial evaluation for acute stroke, known left ICA and left M2 occlusion as seen on prior CTA. EXAM: CT PERFUSION BRAIN TECHNIQUE: Multiphase CT imaging of the brain was performed following IV bolus contrast injection. Subsequent parametric perfusion maps were calculated using RAPID software. CONTRAST:  19mL OMNIPAQUE IOHEXOL 350 MG/ML SOLN COMPARISON:  Prior CTA from earlier same day. FINDINGS: CT Brain Perfusion Findings: CBF (<30%) Volume: 44mL  Perfusion (Tmax>6.0s) volume: 167mL Mismatch Volume: 35mL ASPECTS on noncontrast CT Head: 10 at  21:10  today. Infarct Core: 20 mL Infarction Location:Acute core infarct seen involving the anterior left frontal lobe, left MCA distribution. Moderate surrounding ischemic penumbra. IMPRESSION: Acute core infarct involving the anterior left MCA distribution with moderate surrounding ischemic penumbra as above. Electronically Signed   By: Jeannine Boga M.D.   On: 03/08/2019 23:56   Ct Head Code Stroke Wo Contrast  Result Date: 03/08/2019 CLINICAL DATA:  Code stroke.  Expressive aphasia.  Slurred speech. EXAM: CT HEAD WITHOUT CONTRAST TECHNIQUE: Contiguous axial images were obtained from the base of the skull through the vertex without intravenous contrast. COMPARISON:  None. FINDINGS: Brain: No evidence of old or acute infarction, mass lesion, hemorrhage, hydrocephalus or extra-axial collection. Vascular: Hyperdense left MCA branch, probably M2. Skull: Normal Sinuses/Orbits: Right frontal sinus opacification.  Orbits negative. Other: None ASPECTS ([place] Stroke Program Early CT Score) - Ganglionic level infarction (caudate, lentiform nuclei, internal capsule, insula, M1-M3 cortex): 7 - Supraganglionic infarction (M4-M6 cortex): 3 Total score (0-10 with 10 being normal): 10 IMPRESSION: 1. Normal appearance of the brain itself. However, there is a definite hyperdense left M2 branch consistent with an acute embolus. Right frontal sinusitis. 2. ASPECTS is 10. 3. These results were called by telephone at the time of interpretation on 03/08/2019 at 9:26 pm to Dr. Harlow Asa, who verbally acknowledged these results. Electronically Signed   By: Nelson Chimes M.D.   On: 03/08/2019 21:31    Anti-infectives: Anti-infectives (From admission, onward)   Start     Dose/Rate Route Frequency Ordered Stop   03/09/19 0035  ceFAZolin (ANCEF) 2-4 GM/100ML-% IVPB    Note to Pharmacy: Luis Abed   : cabinet override      03/09/19 0035 03/09/19 1244   03/07/19 2200  cefoTEtan (CEFOTAN) 2 g in sodium chloride 0.9 % 100 mL IVPB      2 g 200 mL/hr over 30 Minutes Intravenous Every 12 hours 03/07/19 1700 03/07/19 2210   03/07/19 1434  clindamycin (CLEOCIN) 900 mg, gentamicin (GARAMYCIN) 240 mg in sodium chloride 0.9 % 1,000 mL for intraperitoneal lavage  Status:  Discontinued       As needed 03/07/19 1434 03/07/19 1456   03/07/19 1400  neomycin (MYCIFRADIN) tablet 1,000 mg  Status:  Discontinued     1,000 mg Oral 3 times per day 03/07/19 1026 03/07/19 1641   03/07/19 1400  metroNIDAZOLE (FLAGYL) tablet 1,000 mg  Status:  Discontinued     1,000 mg Oral 3 times per day 03/07/19 1026 03/07/19 1641   03/07/19 1030  cefoTEtan (CEFOTAN) 2 g in sodium chloride 0.9 % 100 mL IVPB     2 g 200 mL/hr over 30 Minutes Intravenous On call to O.R. 03/07/19 1026 03/07/19 1228   03/07/19 0600  clindamycin (CLEOCIN) 900 mg, gentamicin (GARAMYCIN) 240 mg in sodium chloride 0.9 % 1,000 mL for intraperitoneal lavage  Status:  Discontinued      Irrigation To Surgery 03/06/19 1018 03/07/19 1641      Assessment/Plan: S/P robotic assisted LAR, appendectomy 7/31 by Dr. Johney Maine - NPO P speech therapy eval due to below  Acute L MCA stroke - S/P angio, has expressive aphasia, I D/W Dr. Leonie Man at the bedside, F/U MR imaging, OK for heparin from surgical standpoint  CBC with atypical lymphocytes - repeat in AM, may need outpatient Heme/onc eval  VTE - Lovenox  Dispo - ICU per Stroke Team   LOS: 2 days  Zenovia Jarred 03/09/2019

## 2019-03-09 NOTE — Progress Notes (Signed)
Post IR case discussed with Dr. Estanislado Pandy. LICA possible chronic occlusion, not amenable for stent. LMCA flow TICI2b, no thrombectomy. Possibly feeding left anterior circ from RICA via collaterals. Need to keep BP higher range 308-657 systolic. On neo drip per anesthesia. Will request PCCM consult for pressors. He has been extubated in IR  -- Amie Portland, MD [facility] Pager: [phone] If 7pm to 7am, please call on call as listed on AMION.

## 2019-03-09 NOTE — Progress Notes (Signed)
Patient ID: Adrian [name], male   DOB: 1956-05-24, 63 y.o.   MRN: [id number] INR. 58 Y RH M LSW 730 pm. New onset of aphasia and dysarthria. CT Brain No ICH CTA occluded Lt ICA prox and ? Lt MCA branch. CTP  Core of 20 cc versus penumbra of approx  Option of endovascular revascularization of LT ICA and ? L MCA D/W wife. Procedure,reasons,risks and alternatives reviewed. Risks of ICH of 10 %,worsening neur deficit ,death ,inability to revascularize were reviewed.Spouse expressed understanding and provided consent to proceed with the treatment. S.Noland Pizano MD

## 2019-03-09 NOTE — Anesthesia Postprocedure Evaluation (Signed)
Anesthesia Post Note  Patient: GODWIN TEDESCO  Procedure(s) Performed: IR WITH ANESTHESIA (N/A )     Patient location during evaluation: PACU Anesthesia Type: General Level of consciousness: awake and alert Pain management: pain level controlled Vital Signs Assessment: post-procedure vital signs reviewed and stable Respiratory status: spontaneous breathing, nonlabored ventilation, respiratory function stable and patient connected to nasal cannula oxygen Cardiovascular status: blood pressure returned to baseline and stable Postop Assessment: no apparent nausea or vomiting Anesthetic complications: no    Last Vitals:  Vitals:   03/09/19 1400 03/09/19 1446  BP:  (!) 170/91  Pulse: 66 71  Resp: 18 (!) 21  Temp:    SpO2: 96% (!) 82%    Last Pain:  Vitals:   03/09/19 1200  TempSrc:   PainSc: 0-No pain                 Suri Tafolla DAVID

## 2019-03-09 NOTE — Progress Notes (Signed)
RN rounded with AM neurology.  Verify blood pressure goal to be 150-180.    Lab called RN this AM to communicate results of cbc diff.  Trauma MD notified.

## 2019-03-09 NOTE — Sedation Documentation (Signed)
8 fr angioseal deployed, right fem art sheath pulled by Dr. Estanislado Pandy

## 2019-03-10 ENCOUNTER — Inpatient Hospital Stay (HOSPITAL_COMMUNITY): Payer: BC Managed Care – PPO

## 2019-03-10 ENCOUNTER — Encounter (HOSPITAL_COMMUNITY): Payer: Self-pay | Admitting: Interventional Radiology

## 2019-03-10 DIAGNOSIS — R1310 Dysphagia, unspecified: Secondary | ICD-10-CM

## 2019-03-10 DIAGNOSIS — R4701 Aphasia: Secondary | ICD-10-CM

## 2019-03-10 LAB — CBC
HCT: 31.2 % — ABNORMAL LOW (ref 39.0–52.0)
Hemoglobin: 10.8 g/dL — ABNORMAL LOW (ref 13.0–17.0)
MCH: 31 pg (ref 26.0–34.0)
MCHC: 34.6 g/dL (ref 30.0–36.0)
MCV: 89.7 fL (ref 80.0–100.0)
Platelets: 140 10*3/uL — ABNORMAL LOW (ref 150–400)
RBC: 3.48 MIL/uL — ABNORMAL LOW (ref 4.22–5.81)
RDW: 15.8 % — ABNORMAL HIGH (ref 11.5–15.5)
WBC: 4.7 10*3/uL (ref 4.0–10.5)
nRBC: 0 % (ref 0.0–0.2)

## 2019-03-10 LAB — HEPARIN LEVEL (UNFRACTIONATED)
Heparin Unfractionated: <0.1 IU/mL — ABNORMAL LOW (ref 0.30–0.70)
Heparin Unfractionated: <0.1 IU/mL — ABNORMAL LOW (ref 0.30–0.70)
Heparin Unfractionated: 0.28 IU/mL — ABNORMAL LOW (ref 0.30–0.70)

## 2019-03-10 LAB — APTT
aPTT: 51 seconds — ABNORMAL HIGH (ref 24–36)
aPTT: 25 seconds (ref 24–36)
aPTT: 88 seconds — ABNORMAL HIGH (ref 24–36)

## 2019-03-10 LAB — PATHOLOGIST SMEAR REVIEW

## 2019-03-10 MED ORDER — OSMOLITE 1.2 CAL PO LIQD
1000.0000 mL | ORAL | Status: DC
  Administered 2019-03-10 – 2019-03-14 (×4): 1000 mL
  Filled 2019-03-10 (×6): qty 1000

## 2019-03-10 MED ORDER — VITAL HIGH PROTEIN PO LIQD: 1000.0000 mL | ORAL | Status: DC

## 2019-03-10 MED ORDER — PSYLLIUM 95 % PO PACK
1.0000 | PACK | Freq: Two times a day (BID) | ORAL | Status: DC
  Filled 2019-03-10: qty 1

## 2019-03-10 NOTE — Telephone Encounter (Signed)
When did f/u call from pt's colonoscopy on 03/06/19,reached pt ,who said he is in [facility] . Difficult to understand pt to understand follw up question answers.Will attempt to call his spouse at home.

## 2019-03-10 NOTE — Progress Notes (Signed)
Adrian [name] [id number] 1956/06/26  CARE TEAM:  PCP: [name], MD  Outpatient Care Team: Patient Care Team: [name], MD as PCP - General (Family Medicine) Sueanne Margarita, MD as PCP - Cardiology (Cardiology) Sueanne Margarita, MD as Consulting Physician (Cardiology) Michael Boston, MD as Consulting Physician (General Surgery) Irene Shipper, MD as Consulting Physician (Gastroenterology) Izora Gala, MD as Consulting Physician (Otolaryngology)  Inpatient Treatment Team: Treatment Team: Attending Provider: Garvin Fila, MD; Technician: Leda Quail, [place]; Rounding Team: Stroke, Md, MD; Registered Nurse: Lubertha South, RN; Rounding Team: [name] Radiology, MD; Registered Nurse: Fortino Sic, RN; Technician: Wylene Men, [place]; Occupational Therapist: Randon Goldsmith, OT; Utilization Review: Sindy Guadeloupe, RN; Consulting Physician: Michael Boston, MD   Problem List:   Principal Problem:   Diverticulitis of large intestine with abscess Active Problems:   NICM (nonischemic cardiomyopathy) ([place])   History of radiation therapy   Smokes tobacco daily   Hypothyroidism   CAD (coronary artery disease), native coronary artery   Chronic atrial fibrillation   Current use of long term anticoagulation   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 MCA revascularization via collaterals TICI 2b  to 2C .  Assessment  Stabilizing  Neurology, Neuroradiology, ICU, RN, Code Stroke teams help appreciated  [facility] Stay = 3 days)  Plan:  -bowel functioning - OK to adv diet as tolerated to solids once speech Tx determines safest course.  Assuming Dys1 to Dys3 evetually -OK to switch to oral anticoagulation POD#2 = OK now.  No major drop on full gtt anticoagulation -Expressive aphasia but o/w seems OK - agree w speech therapy, PT/OT, anticoagulation -intentional HTN w Neo for now -f/u pathology -VTE prophylaxis- SCDs, etc -mobilize as tolerated to help recovery  35 minutes spent in review, evaluation, examination, counseling, and coordination of care.  More than 50% of that time was spent in counseling.  03/10/2019    Subjective: (Chief complaint)  Awakens in NAD Finds hard to find words sometimes but comprehends fine & moving all 4 extremities Denies nausea nor pain  Objective:  Vital signs:  Vitals:   03/10/19 0530 03/10/19 0545 03/10/19 0600 03/10/19 0615  BP: 127/69 125/74 134/76 133/82  Pulse: 65 73 73 62  Resp: 19 20 (!) 22 (!) 22  Temp:      TempSrc:      SpO2: 95% 96% 96% 98%  Weight:      Height:        Last BM Date: 03/08/19  Intake/Output   Yesterday:  08/02 0701 - 08/03 0700 In: 3953.9 [I.V.:3953.9] Out: 2924 [Urine:3630] This shift:  Total I/O In: 2146.1 [I.V.:2146.1] Out: 630 [Urine:630]  Bowel function:  Flatus: YES  BM:  YES  Drain: (No drain)   Physical Exam:  General: Pt awake/alert/oriented x4 in no acute distress Eyes: PERRL, normal EOM.  Sclera clear.  No icterus  Neuro: CN II-XII intact w/o focal sensory/motor deficits.  Expressive aphasia moderate.  Lymph: No head/neck/groin lymphadenopathy Psych:  No delerium/psychosis/paranoia  HENT: Normocephalic, Mucus membranes moist.  No thrush Neck: Supple, No tracheal deviation Chest: No chest wall pain w good excursion CV:  Pulses intact.  Regular rhythm MS: Normal AROM mjr  joints.  No obvious deformity  Abdomen: Soft.  Nondistended.  Nontender.  No evidence of peritonitis.  No incarcerated hernias.  Ext:   No deformity.  No mjr edema.  No cyanosis Skin: No petechiae / purpura  Results:   Cultures: Recent Results (from the past 720 hour(s))  SARS Coronavirus 2 (Performed in [place] hospital lab)     Status: None   Collection Time: 03/04/19  9:47 AM   Specimen: Nasal Swab  Result Value Ref Range Status   SARS Coronavirus 2 NEGATIVE NEGATIVE Final    Comment: (NOTE) SARS-CoV-2 target nucleic acids are NOT DETECTED. The SARS-CoV-2 RNA is generally detectable in upper and lower respiratory specimens during the acute phase of infection. Negative results do not preclude SARS-CoV-2 infection, do not rule out co-infections with other pathogens, and should not be used as the sole basis for treatment or other patient management decisions. Negative results must be combined with clinical observations, patient history, and epidemiological information. The expected result is Negative. Fact Sheet for Patients: SugarRoll.be Fact Sheet for Healthcare Providers: https://www.woods-mathews.com/ This test is not yet approved or cleared by the [place] FDA and  has been authorized for detection and/or diagnosis of SARS-CoV-2 by FDA under an Emergency Use Authorization (EUA). This EUA will remain  in effect (meaning this test can be used) for the duration of the COVID-19 declaration under Section 56 4(b)(1) of the Act, 21 U.S.C. section 360bbb-3(b)(1), unless the authorization is terminated or revoked sooner. Performed at [facility], [place] [address] [place], [place] [address]   MRSA PCR Screening     Status: None   Collection Time: 03/08/19 10:44 PM   Specimen: Nasal Mucosa; Nasopharyngeal  Result Value Ref Range Status   MRSA by PCR NEGATIVE NEGATIVE Final    Comment:        The GeneXpert MRSA Assay  (FDA approved for NASAL specimens only), is one component of a comprehensive MRSA colonization surveillance program. It is not intended to diagnose MRSA infection nor to guide or monitor treatment for MRSA infections. Performed at [facility], [place] [address], [place], [place] [address]     Labs: Results for orders placed or performed during the hospital encounter of 03/07/19 (from the past 48 hour(s))  Basic metabolic panel     Status: Abnormal   Collection Time: 03/08/19  9:06 AM  Result Value Ref Range   Sodium 136 135 - 145 mmol/L   Potassium 4.1 3.5 - 5.1 mmol/L   Chloride 102 98 - 111 mmol/L   CO2 27 22 - 32 mmol/L   Glucose, Bld 135 (H) 70 - 99 mg/dL   BUN 10 8 - 23 mg/dL   Creatinine, Ser 0.68 0.61 - 1.24 mg/dL   Calcium 8.5 (L) 8.9 - 10.3 mg/dL   GFR calc non Af Amer >60 >60 mL/min   GFR calc Af Amer >60 >60 mL/min   Anion gap 7 5 - 15    Comment: Performed at [facility], [place] [address] [place], [place] [address]  CBC     Status: Abnormal   Collection Time: 03/08/19  9:06 AM  Result Value Ref Range   WBC 3.1 (L) 4.0 - 10.5 K/uL   RBC 4.07 (L) 4.22 - 5.81 MIL/uL   Hemoglobin  12.8 (L) 13.0 - 17.0 g/dL   HCT 37.5 (L) 39.0 - 52.0 %   MCV 92.1 80.0 - 100.0 fL   MCH 31.4 26.0 - 34.0 pg   MCHC 34.1 30.0 - 36.0 g/dL   RDW 16.0 (H) 11.5 - 15.5 %   Platelets 124 (L) 150 - 400 K/uL   nRBC 0.0 0.0 - 0.2 %    Comment: Performed at [facility], [place] [address], [place], [place] [address]  Magnesium     Status: None   Collection Time: 03/08/19  9:06 AM  Result Value Ref Range   Magnesium 1.8 1.7 - 2.4 mg/dL    Comment: Performed at [facility], [place] [address] [place], [place] [address]  Glucose, capillary     Status: Abnormal   Collection Time: 03/08/19  8:53 PM  Result Value Ref Range   Glucose-Capillary 107 (H) 70 - 99 mg/dL  MRSA PCR Screening     Status: None   Collection Time: 03/08/19 10:44 PM    Specimen: Nasal Mucosa; Nasopharyngeal  Result Value Ref Range   MRSA by PCR NEGATIVE NEGATIVE    Comment:        The GeneXpert MRSA Assay (FDA approved for NASAL specimens only), is one component of a comprehensive MRSA colonization surveillance program. It is not intended to diagnose MRSA infection nor to guide or monitor treatment for MRSA infections. Performed at [facility], [place] [address], [place], [place] [address]   Protime-INR     Status: Abnormal   Collection Time: 03/09/19  2:15 AM  Result Value Ref Range   Prothrombin Time 15.7 (H) 11.4 - 15.2 seconds   INR 1.3 (H) 0.8 - 1.2    Comment: (NOTE) INR goal varies based on device and disease states. Performed at [facility], [place] [address], [place], [place] [address]   APTT     Status: Abnormal   Collection Time: 03/09/19  2:15 AM  Result Value Ref Range   aPTT >200 (HH) 24 - 36 seconds    Comment:        IF BASELINE aPTT IS ELEVATED, SUGGEST PATIENT RISK ASSESSMENT BE USED TO DETERMINE APPROPRIATE ANTICOAGULANT THERAPY. REPEATED TO VERIFY CRITICAL RESULT CALLED TO, READ BACK BY AND VERIFIED WITH: PEICERT M AT 6384 ON 01/11/5992 BY Epifanio Lesches S Performed at [facility], [address] [place], [place] [address]   Hemoglobin A1c     Status: None   Collection Time: 03/09/19  6:56 AM  Result Value Ref Range   Hgb A1c MFr Bld 5.5 4.8 - 5.6 %    Comment: (NOTE) Pre diabetes:          5.7%-6.4% Diabetes:              >6.4% Glycemic control for   <7.0% adults with diabetes    Mean Plasma Glucose 111.15 mg/dL    Comment: Performed at [place] [address] [place],  [address]  Lipid panel     Status: None   Collection Time: 03/09/19  6:56 AM  Result Value Ref Range   Cholesterol 110 0 - 200 mg/dL   Triglycerides 97 <150 mg/dL   HDL 45 >40 mg/dL   Total CHOL/HDL Ratio 2.4 RATIO   VLDL 19 0 - 40 mg/dL   LDL Cholesterol 46 0 - 99 mg/dL    Comment:        Total  Cholesterol/HDL:CHD Risk Coronary Heart Disease  Risk Table                     Men   Women  1/2 Average Risk   3.4   3.3  Average Risk       5.0   4.4  2 X Average Risk   9.6   7.1  3 X Average Risk  23.4   11.0        Use the calculated Patient Ratio above and the CHD Risk Table to determine the patient's CHD Risk.        ATP III CLASSIFICATION (LDL):  <100     mg/dL   Optimal  100-129  mg/dL   Near or Above                    Optimal  130-159  mg/dL   Borderline  160-189  mg/dL   High  >190     mg/dL   Very High Performed at [place] [address] [place], [place] [address]   CBC with Differential/Platelet     Status: Abnormal   Collection Time: 03/09/19  6:56 AM  Result Value Ref Range   WBC 5.0 4.0 - 10.5 K/uL   RBC 3.97 (L) 4.22 - 5.81 MIL/uL   Hemoglobin 12.5 (L) 13.0 - 17.0 g/dL   HCT 36.5 (L) 39.0 - 52.0 %   MCV 91.9 80.0 - 100.0 fL   MCH 31.5 26.0 - 34.0 pg   MCHC 34.2 30.0 - 36.0 g/dL   RDW 16.2 (H) 11.5 - 15.5 %   Platelets 125 (L) 150 - 400 K/uL    Comment: REPEATED TO VERIFY PLATELET COUNT CONFIRMED BY SMEAR SPECIMEN CHECKED FOR CLOTS    nRBC 0.0 0.0 - 0.2 %   Neutrophils Relative % 14 %   Lymphocytes Relative 70 %   Monocytes Relative 12 %   Eosinophils Relative 0 %   Basophils Relative 0 %   Band Neutrophils 4 %   Metamyelocytes Relative 0 %   Myelocytes 0 %   Promyelocytes Relative 0 %   Blasts 0 %   nRBC 0 0 /100 WBC   Other 0 %   Neutro Abs 0.9 (L) 1.7 - 7.7 K/uL   Lymphs Abs 3.5 0.7 - 4.0 K/uL   Monocytes Absolute 0.6 0.1 - 1.0 K/uL   Eosinophils Absolute 0.0 0.0 - 0.5 K/uL   Basophils Absolute 0.0 0.0 - 0.1 K/uL   WBC Morphology ATYPICAL LYMPHOCYTES     Comment: SMUDGE CELLS ABSOLUTE LYMPHOCYTOSIS Performed at [facility], [address] [place], [place] [address]   Basic metabolic panel     Status: Abnormal   Collection Time: 03/09/19  6:56 AM  Result Value Ref Range   Sodium 139 135 - 145 mmol/L   Potassium 3.8  3.5 - 5.1 mmol/L   Chloride 104 98 - 111 mmol/L   CO2 25 22 - 32 mmol/L   Glucose, Bld 98 70 - 99 mg/dL   BUN 7 (L) 8 - 23 mg/dL   Creatinine, Ser 0.70 0.61 - 1.24 mg/dL   Calcium 8.4 (L) 8.9 - 10.3 mg/dL   GFR calc non Af Amer >60 >60 mL/min   GFR calc Af Amer >60 >60 mL/min   Anion gap 10 5 - 15    Comment: Performed at [facility], [place] [address], [place], [place] [address]  Heparin level (unfractionated)     Status: Abnormal  Collection Time: 03/09/19  2:29 PM  Result Value Ref Range   Heparin Unfractionated 0.17 (L) 0.30 - 0.70 IU/mL    Comment: (NOTE) If heparin results are below expected values, and patient dosage has  been confirmed, suggest follow up testing of antithrombin III levels. Performed at [facility], [place] [address], [place], [place] [address]   APTT     Status: Abnormal   Collection Time: 03/09/19  2:29 PM  Result Value Ref Range   aPTT 39 (H) 24 - 36 seconds    Comment:        IF BASELINE aPTT IS ELEVATED, SUGGEST PATIENT RISK ASSESSMENT BE USED TO DETERMINE APPROPRIATE ANTICOAGULANT THERAPY. Performed at [facility], [place] [address], [place], [place] [address]   APTT     Status: Abnormal   Collection Time: 03/09/19  9:27 PM  Result Value Ref Range   aPTT 55 (H) 24 - 36 seconds    Comment:        IF BASELINE aPTT IS ELEVATED, SUGGEST PATIENT RISK ASSESSMENT BE USED TO DETERMINE APPROPRIATE ANTICOAGULANT THERAPY. Performed at [facility], [place] [address], [place], [place] [address]   Heparin level (unfractionated)     Status: Abnormal   Collection Time: 03/09/19  9:27 PM  Result Value Ref Range   Heparin Unfractionated 0.13 (L) 0.30 - 0.70 IU/mL    Comment: (NOTE) If heparin results are below expected values, and patient dosage has  been confirmed, suggest follow up testing of antithrombin III levels. Performed at [facility], [place] [address] [place] [address], [place], [place] [address]   CBC     Status: Abnormal   Collection  Time: 03/10/19  5:45 AM  Result Value Ref Range   WBC 4.7 4.0 - 10.5 K/uL   RBC 3.48 (L) 4.22 - 5.81 MIL/uL   Hemoglobin 10.8 (L) 13.0 - 17.0 g/dL   HCT 31.2 (L) 39.0 - 52.0 %   MCV 89.7 80.0 - 100.0 fL   MCH 31.0 26.0 - 34.0 pg   MCHC 34.6 30.0 - 36.0 g/dL   RDW 15.8 (H) 11.5 - 15.5 %   Platelets 140 (L) 150 - 400 K/uL   nRBC 0.0 0.0 - 0.2 %    Comment: Performed at [facility], [place] [address], [place], [place] [address]  Heparin level (unfractionated)     Status: Abnormal   Collection Time: 03/10/19  5:45 AM  Result Value Ref Range   Heparin Unfractionated <0.10 (L) 0.30 - 0.70 IU/mL    Comment: (NOTE) If heparin results are below expected values, and patient dosage has  been confirmed, suggest follow up testing of antithrombin III levels. Performed at [facility], [place] [address], [place], [place] [address]   APTT     Status: Abnormal   Collection Time: 03/10/19  5:45 AM  Result Value Ref Range   aPTT 51 (H) 24 - 36 seconds    Comment:        IF BASELINE aPTT IS ELEVATED, SUGGEST PATIENT RISK ASSESSMENT BE USED TO DETERMINE APPROPRIATE ANTICOAGULANT THERAPY. Performed at [facility], [place] [address], [place],  [address]     Imaging / Studies: Ct Angio Head W Or Wo Contrast  Result Date: 03/08/2019 CLINICAL DATA:  Expressive aphasia.  Left hyperdense MCA at head CT. EXAM: CT ANGIOGRAPHY HEAD AND NECK TECHNIQUE: Multidetector CT imaging of the head and neck was performed using the standard protocol during bolus administration  of intravenous contrast. Multiplanar CT image reconstructions and MIPs were obtained to evaluate the vascular anatomy. Carotid stenosis measurements (when applicable) are obtained utilizing NASCET criteria, using the distal internal carotid diameter as the denominator. CONTRAST:  140mL OMNIPAQUE IOHEXOL 350 MG/ML SOLN COMPARISON:  Head CT earlier same day. FINDINGS: CTA NECK FINDINGS Aortic arch: Mild aortic atherosclerosis. No  aneurysm or dissection. Left vertebral artery arises directly from the arch. Right carotid system: Common carotid artery widely patent to the bifurcation. Mild atherosclerotic disease at carotid bifurcation but no stenosis or irregularity. Cervical ICA widely patent. Left carotid system: Common carotid artery is widely patent to the bifurcation. Advanced atherosclerotic disease at the carotid bifurcation with soft and calcified plaque and occlusion of the ICA at the bulb. No reconstituted flow in the cervical region. Small amount of reconstituted flow in the carotid canal. Vertebral arteries: Both vertebral artery origins are widely patent. Left vertebral artery arises from the arch as noted above. Both vertebral arteries widely patent through the cervical region. Skeleton: Cervical spondylosis.  Previous ACDF C5-6. Other neck: No mass or adenopathy. Upper chest: Pleural and parenchymal scarring at both lung apices. Review of the MIP images confirms the above findings CTA HEAD FINDINGS Anterior circulation: Right internal carotid artery is widely patent through the skull base and siphon region. Mild siphon atherosclerotic calcification. Right anterior and middle cerebral arteries are widely patent. Left internal carotid artery shows mild reconstituted flow in the carotid canal and flow in the siphon region. Siphon atherosclerotic calcification. Left anterior cerebral artery is widely patent. Left M1 segment is widely patent. Occluded left M2 branch as suggested by noncontrast CT. Posterior circulation: Both vertebral arteries are widely patent to the basilar. No basilar stenosis. Posterior circulation branch vessels are patent. Venous sinuses: Patent and normal. Anatomic variants: None significant. Review of the MIP images confirms the above findings IMPRESSION: Acute occlusion of the left ICA at its origin. Reconstitution of the ICA by collaterals at the skull base level. Occluded left M2 branch. These results were  called by telephone at the time of interpretation on 03/08/2019 at 9:55 pm to Dr. Armandina Gemma , who verbally acknowledged these results. Electronically Signed   By: Nelson Chimes M.D.   On: 03/08/2019 22:00   Ct Angio Neck W Or Wo Contrast  Result Date: 03/08/2019 CLINICAL DATA:  Expressive aphasia.  Left hyperdense MCA at head CT. EXAM: CT ANGIOGRAPHY HEAD AND NECK TECHNIQUE: Multidetector CT imaging of the head and neck was performed using the standard protocol during bolus administration of intravenous contrast. Multiplanar CT image reconstructions and MIPs were obtained to evaluate the vascular anatomy. Carotid stenosis measurements (when applicable) are obtained utilizing NASCET criteria, using the distal internal carotid diameter as the denominator. CONTRAST:  180mL OMNIPAQUE IOHEXOL 350 MG/ML SOLN COMPARISON:  Head CT earlier same day. FINDINGS: CTA NECK FINDINGS Aortic arch: Mild aortic atherosclerosis. No aneurysm or dissection. Left vertebral artery arises directly from the arch. Right carotid system: Common carotid artery widely patent to the bifurcation. Mild atherosclerotic disease at carotid bifurcation but no stenosis or irregularity. Cervical ICA widely patent. Left carotid system: Common carotid artery is widely patent to the bifurcation. Advanced atherosclerotic disease at the carotid bifurcation with soft and calcified plaque and occlusion of the ICA at the bulb. No reconstituted flow in the cervical region. Small amount of reconstituted flow in the carotid canal. Vertebral arteries: Both vertebral artery origins are widely patent. Left vertebral artery arises from the arch as noted above. Both vertebral  arteries widely patent through the cervical region. Skeleton: Cervical spondylosis.  Previous ACDF C5-6. Other neck: No mass or adenopathy. Upper chest: Pleural and parenchymal scarring at both lung apices. Review of the MIP images confirms the above findings CTA HEAD FINDINGS Anterior  circulation: Right internal carotid artery is widely patent through the skull base and siphon region. Mild siphon atherosclerotic calcification. Right anterior and middle cerebral arteries are widely patent. Left internal carotid artery shows mild reconstituted flow in the carotid canal and flow in the siphon region. Siphon atherosclerotic calcification. Left anterior cerebral artery is widely patent. Left M1 segment is widely patent. Occluded left M2 branch as suggested by noncontrast CT. Posterior circulation: Both vertebral arteries are widely patent to the basilar. No basilar stenosis. Posterior circulation branch vessels are patent. Venous sinuses: Patent and normal. Anatomic variants: None significant. Review of the MIP images confirms the above findings IMPRESSION: Acute occlusion of the left ICA at its origin. Reconstitution of the ICA by collaterals at the skull base level. Occluded left M2 branch. These results were called by telephone at the time of interpretation on 03/08/2019 at 9:55 pm to Dr. Armandina Gemma , who verbally acknowledged these results. Electronically Signed   By: Nelson Chimes M.D.   On: 03/08/2019 22:00   Mr Brain Wo Contrast  Result Date: 03/09/2019 CLINICAL DATA:  63 year old male emergent left ICA and left M2 Large Vessel occlusion status post endovascular treatment. EXAM: MRI HEAD WITHOUT CONTRAST TECHNIQUE: Multiplanar, multiecho pulse sequences of the brain and surrounding structures were obtained without intravenous contrast. COMPARISON:  CT head, CTA head and neck and CT perfusion 03/08/2019. FINDINGS: Brain: Restricted diffusion in the left MCA middle to anterior division territory involving most of the insula, left operculum cortex, cortex of the left inferior frontal gyrus and tracking cephalad to the left middle frontal gyrus (series 7, image 65). This is similar to the estimated core infarct distribution on CTP, perhaps slightly increased in extent. Associated cytotoxic edema  with T2 and FLAIR hyperintensity. Superimposed small area of superior left parietal lobe cortical encephalomalacia on series 9, image 21. Trace petechial hemorrhage at the left insula on series 12, image 26. No contralateral right hemisphere or posterior fossa restricted diffusion. No midline shift, mass effect, evidence of mass lesion, ventriculomegaly, extra-axial collection or acute intracranial hemorrhage. Cervicomedullary junction and pituitary are within normal limits. Vascular: Major intracranial vascular flow voids are preserved, although the left ICA petrous segment flow void is diminished on series 15, image 6. There is some asymmetric left MCA branch FLAIR signal (series 9, image 13). Skull and upper cervical spine: Chronic disc and endplate degeneration at C3-C4 with mild spinal stenosis at that level. There is heterogeneous marrow signal in the clivus on series 14, image 12 which is also conspicuous on diffusion-weighted imaging., but elsewhere visible bone marrow signal is within normal limits. This area appeared normal by CT. Sinuses/Orbits: Negative orbits. Right frontal and frontoethmoidal sinus disease redemonstrated. Other: Mastoids are clear. Visible internal auditory structures appear normal. Scalp and face soft tissues appear negative. IMPRESSION: 1. Acute Left MCA infarct involving the middle/anterior division territory similar to the CTP core estimate. Trace petechial hemorrhage (Heidelberg Classification 1a). No mass effect. 2. Asymmetrically decreased Left ICA petrous segment flow void, but otherwise preserved major vascular flow voids. 3. Indeterminate abnormal marrow signal in the clivus, which had an unremarkable CT appearance yesterday. Recommend follow-up Two-view Chest Radiographs when possible, and if negative a 3 month follow-up noncontrast Brain MRI to re-evaluate the  clivus. Electronically Signed   By: Genevie Ann M.D.   On: 03/09/2019 13:21   Ct Cerebral Perfusion W  Contrast  Result Date: 03/08/2019 CLINICAL DATA:  Initial evaluation for acute stroke, known left ICA and left M2 occlusion as seen on prior CTA. EXAM: CT PERFUSION BRAIN TECHNIQUE: Multiphase CT imaging of the brain was performed following IV bolus contrast injection. Subsequent parametric perfusion maps were calculated using RAPID software. CONTRAST:  79mL OMNIPAQUE IOHEXOL 350 MG/ML SOLN COMPARISON:  Prior CTA from earlier same day. FINDINGS: CT Brain Perfusion Findings: CBF (<30%) Volume: 51mL Perfusion (Tmax>6.0s) volume: 127mL Mismatch Volume: 67mL ASPECTS on noncontrast CT Head: 10 at  21:10 today. Infarct Core: 20 mL Infarction Location:Acute core infarct seen involving the anterior left frontal lobe, left MCA distribution. Moderate surrounding ischemic penumbra. IMPRESSION: Acute core infarct involving the anterior left MCA distribution with moderate surrounding ischemic penumbra as above. Electronically Signed   By: Jeannine Boga M.D.   On: 03/08/2019 23:56   Ct Head Code Stroke Wo Contrast  Result Date: 03/08/2019 CLINICAL DATA:  Code stroke.  Expressive aphasia.  Slurred speech. EXAM: CT HEAD WITHOUT CONTRAST TECHNIQUE: Contiguous axial images were obtained from the base of the skull through the vertex without intravenous contrast. COMPARISON:  None. FINDINGS: Brain: No evidence of old or acute infarction, mass lesion, hemorrhage, hydrocephalus or extra-axial collection. Vascular: Hyperdense left MCA branch, probably M2. Skull: Normal Sinuses/Orbits: Right frontal sinus opacification.  Orbits negative. Other: None ASPECTS ([place] Stroke Program Early CT Score) - Ganglionic level infarction (caudate, lentiform nuclei, internal capsule, insula, M1-M3 cortex): 7 - Supraganglionic infarction (M4-M6 cortex): 3 Total score (0-10 with 10 being normal): 10 IMPRESSION: 1. Normal appearance of the brain itself. However, there is a definite hyperdense left M2 branch consistent with an acute embolus.  Right frontal sinusitis. 2. ASPECTS is 10. 3. These results were called by telephone at the time of interpretation on 03/08/2019 at 9:26 pm to Dr. Harlow Asa, who verbally acknowledged these results. Electronically Signed   By: Nelson Chimes M.D.   On: 03/08/2019 21:31    Medications / Allergies: per chart  Antibiotics: Anti-infectives (From admission, onward)   Start     Dose/Rate Route Frequency Ordered Stop   03/09/19 0035  ceFAZolin (ANCEF) 2-4 GM/100ML-% IVPB    Note to Pharmacy: Luis Abed   : cabinet override      03/09/19 0035 03/09/19 1244   03/07/19 2200  cefoTEtan (CEFOTAN) 2 g in sodium chloride 0.9 % 100 mL IVPB     2 g 200 mL/hr over 30 Minutes Intravenous Every 12 hours 03/07/19 1700 03/07/19 2210   03/07/19 1434  clindamycin (CLEOCIN) 900 mg, gentamicin (GARAMYCIN) 240 mg in sodium chloride 0.9 % 1,000 mL for intraperitoneal lavage  Status:  Discontinued       As needed 03/07/19 1434 03/07/19 1456   03/07/19 1400  neomycin (MYCIFRADIN) tablet 1,000 mg  Status:  Discontinued     1,000 mg Oral 3 times per day 03/07/19 1026 03/07/19 1641   03/07/19 1400  metroNIDAZOLE (FLAGYL) tablet 1,000 mg  Status:  Discontinued     1,000 mg Oral 3 times per day 03/07/19 1026 03/07/19 1641   03/07/19 1030  cefoTEtan (CEFOTAN) 2 g in sodium chloride 0.9 % 100 mL IVPB     2 g 200 mL/hr over 30 Minutes Intravenous On call to O.R. 03/07/19 1026 03/07/19 1228   03/07/19 0600  clindamycin (CLEOCIN) 900 mg, gentamicin (GARAMYCIN) 240 mg in sodium chloride  0.9 % 1,000 mL for intraperitoneal lavage  Status:  Discontinued      Irrigation To Surgery 03/06/19 1018 03/07/19 1641        Note: Portions of this report may have been transcribed using voice recognition software. Every effort was made to ensure accuracy; however, inadvertent computerized transcription errors may be present.   Any transcriptional errors that result from this process are unintentional.     [name], MD, FACS,  [facility]    [address], [place], [place] [address] [phone] Main / Paging [phone] Fax

## 2019-03-10 NOTE — Progress Notes (Signed)
New SBP goal 120-140 per Dr. Leonie Man

## 2019-03-10 NOTE — Procedures (Signed)
Cortrak  Tube Type:  Cortrak - 43 inches Tube Location:  Left nare Initial Placement:  Stomach Secured by: Bridle Technique Used to Measure Tube Placement:  Documented cm marking at nare/ corner of mouth Cortrak Secured At:  70 cm    Cortrak Tube Team Note:  Consult received to place a Cortrak feeding tube.   No x-ray is required. RN may begin using tube.   If the tube becomes dislodged please keep the tube and contact the Cortrak team at [email or web address] (password [email or web address]) for replacement.  If after hours and replacement cannot be delayed, place a NG tube and confirm placement with an abdominal x-ray.    Koleen Distance MS, RD, LDN Pager #- [phone] Office#- [phone] After Hours Pager: [phone]

## 2019-03-10 NOTE — Progress Notes (Signed)
Physical Therapy Treatment Patient Details Name: Adrian [name] MRN: [id number] DOB: [birth year] Today's Date: 03/10/2019    History of Present Illness Acute Left MCA infarct involving the middle/anterior divisionterritoryPt is a 63 y.o. M with significant PMH of CAD, tonsillar CA in remission, atrial fibrillation on anticoagulation which was held for recent surgery who was transferred emergently from [facility] where had been admitted for diverticulitis of large intestine with abscess requiring exploratory laparotomy, low anterior resection rigid proctoscopy, appendectomy. Presents with difficulty speaking. CTA head and neck showing left cervical carotid occlusion as well as left MCA occlusion. Attempted mechanical thrombectomy was unsuccessful and carotid artery also could not be revascularized.  MRI:    PT Comments    Pt improving with stability.  Emphasis on significant balance challenge and controlling gait speeds.    Follow Up Recommendations  Outpatient PT;Supervision/Assistance - 24 hour     Equipment Recommendations  None recommended by PT    Recommendations for Other Services       Precautions / Restrictions Precautions Precautions: Fall Restrictions Weight Bearing Restrictions: No    Mobility  Bed Mobility Overal bed mobility: Modified Independent                Transfers Overall transfer level: Needs assistance Equipment used: None Transfers: Sit to/from Stand Sit to Stand: Min guard         General transfer comment: Ambulated 150 feet with minguard A and no AD  Ambulation/Gait Ambulation/Gait assistance: Min guard(min as he is challenged significantly) Gait Distance (Feet): 400 Feet(x2) Assistive device: None Gait Pattern/deviations: Step-through pattern   Gait velocity interpretation: >2.62 ft/sec, indicative of community ambulatory(safe at above) General Gait Details: min guard appropriate except at higher cadence or significantly challenged.  pt  steady, but can not control his speed well, occasionally getting mildly out of control   Stairs Stairs: Yes Stairs assistance: Min guard Stair Management: One rail Right;Alternating pattern;Forwards Number of Stairs: 3 General stair comments: safe with rail   Wheelchair Mobility    Modified Rankin (Stroke Patients Only) Modified Rankin (Stroke Patients Only) Modified Rankin: Moderate disability     Balance Overall balance assessment: Needs assistance Sitting-balance support: No upper extremity supported;Feet supported Sitting balance-Leahy Scale: Good     Standing balance support: No upper extremity supported;During functional activity Standing balance-Leahy Scale: Fair                              Cognition Arousal/Alertness: Awake/alert Behavior During Therapy: WFL for tasks assessed/performed Overall Cognitive Status: (follows simple commands concretely and relatively quickly)                                 General Comments: Following all commands. Displays expressive difficulties--difficulty with word finding. Able to consistently answer yes/no questions. Asked him to read a bullet point on paper in his room with his glasses on --he followed along with his finger as he read--only 3 of 25 words were intelligible.      Exercises      General Comments        Pertinent Vitals/Pain Pain Assessment: Faces Faces Pain Scale: No hurt    Home Living Family/patient expects to be discharged to:: Private residence Living Arrangements: Spouse/significant other(separated from wife) Available Help at Discharge: Family(unsure how much available) Type of Home: House Home Access: Stairs to enter   Home Layout: Able  to live on main level with bedroom/bathroom   Additional Comments: Pt is currently separated from wife, but his wife states she plans to have him live with her and be his caregiver. Wife will be working from home. Pt wife is a Scientific laboratory technician  for [employer].     Prior Function Level of Independence: Independent      Comments: works for [employer], been out since March    PT Goals (current goals can now be found in the care plan section) Acute Rehab PT Goals Patient Stated Goal: unable to PT Goal Formulation: With patient/family Time For Goal Achievement: 03/23/19 Potential to Achieve Goals: Good Progress towards PT goals: Progressing toward goals    Frequency    Min 4X/week      PT Plan Current plan remains appropriate    Co-evaluation              AM-PAC PT "6 Clicks" Mobility   Outcome Measure  Help needed turning from your back to your side while in a flat bed without using bedrails?: None Help needed moving from lying on your back to sitting on the side of a flat bed without using bedrails?: None Help needed moving to and from a bed to a chair (including a wheelchair)?: A Little Help needed standing up from a chair using your arms (e.g., wheelchair or bedside chair)?: A Little Help needed to walk in hospital room?: A Little Help needed climbing 3-5 steps with a railing? : A Little 6 Click Score: 20    End of Session   Activity Tolerance: Patient tolerated treatment well Patient left: in bed;with call bell/phone within reach Nurse Communication: Mobility status PT Visit Diagnosis: Unsteadiness on feet (R26.81);Difficulty in walking, not elsewhere classified (R26.2)     Time: 1610-9604 PT Time Calculation (min) (ACUTE ONLY): 18 min  Charges:  $Gait Training: 8-22 mins                     03/10/2019  [place] Bing, PT Acute Rehabilitation Services [phone]  (pager) [phone]  (office)   [name] 03/10/2019, 6:23 PM

## 2019-03-10 NOTE — Evaluation (Signed)
Occupational Therapy Evaluation Patient Details Name: Adrian [name] MRN: [id number] DOB: March 09, 1956 Today's Date: 03/10/2019    History of Present Illness Acute Left MCA infarct involving the middle/anterior divisionterritoryPt is a 63 y.o. M with significant PMH of CAD, tonsillar CA in remission, atrial fibrillation on anticoagulation which was held for recent surgery who was transferred emergently from [facility] where had been admitted for diverticulitis of large intestine with abscess requiring exploratory laparotomy, low anterior resection rigid proctoscopy, appendectomy. Presents with difficulty speaking. CTA head and neck showing left cervical carotid occlusion as well as left MCA occlusion. Attempted mechanical thrombectomy was unsuccessful and carotid artery also could not be revascularized.  MRI:   Clinical Impression   This 63 yo male admitted and underwent above presents to acute OT with mild balance deficits when up on his feet and expressive difficulties thus affecting his safety and independence at basic ADLs. He will benefit from acute OT with follow up OPOT.    Follow Up Recommendations  Outpatient OT;Supervision/Assistance - 24 hour    Equipment Recommendations  None recommended by OT       Precautions / Restrictions Precautions Precautions: Fall Restrictions Weight Bearing Restrictions: No      Mobility Bed Mobility Overal bed mobility: Modified Independent                Transfers Overall transfer level: Needs assistance Equipment used: None Transfers: Sit to/from Stand Sit to Stand: Min guard         General transfer comment: Ambulated 150 feet with minguard A and no AD    Balance Overall balance assessment: Needs assistance Sitting-balance support: No upper extremity supported;Feet supported Sitting balance-Leahy Scale: Good     Standing balance support: No upper extremity supported;During functional activity Standing balance-Leahy Scale:  Fair                             ADL either performed or assessed with clinical judgement   ADL Overall ADL's : Needs assistance/impaired                                       General ADL Comments: set up/S with min guard when up on his feet without AD     Vision Baseline Vision/History: Wears glasses Wears Glasses: At all times Patient Visual Report: No change from baseline              Pertinent Vitals/Pain Pain Assessment: Faces Faces Pain Scale: No hurt     Hand Dominance Right   Extremity/Trunk Assessment Upper Extremity Assessment Upper Extremity Assessment: Overall WFL for tasks assessed           Communication Communication Communication: Expressive difficulties   Cognition Arousal/Alertness: Awake/alert Behavior During Therapy: WFL for tasks assessed/performed Overall Cognitive Status: Difficult to assess                                 General Comments: Following all commands. Displays expressive difficulties--difficulty with word finding. Able to consistently answer yes/no questions. Asked him to read a bullet point on paper in his room with his glasses on --he followed along with his finger as he read--only 3 of 25 words were intelligible.              Home Living Family/patient  expects to be discharged to:: Private residence Living Arrangements: Spouse/significant other(separated from wife) Available Help at Discharge: Family(unsure how much available) Type of Home: House Home Access: Stairs to enter     Home Layout: Able to live on main level with bedroom/bathroom     Bathroom Shower/Tub: Teacher, early years/pre: Standard         Additional Comments: Pt is currently separated from wife, but his wife states she plans to have him live with her and be his caregiver. Wife will be working from home. Pt wife is a Automotive engineer for [employer].       Prior Functioning/Environment Level of  Independence: Independent        Comments: works for [employer], been out since March         OT Problem List: Decreased safety awareness;Decreased cognition      OT Treatment/Interventions: Self-care/ADL training;Patient/family education;Balance training;Cognitive remediation/compensation    OT Goals(Current goals can be found in the care plan section) Acute Rehab OT Goals Patient Stated Goal: unable to OT Goal Formulation: Patient unable to participate in goal setting(due to expressive difficulties) Time For Goal Achievement: 03/24/19 Potential to Achieve Goals: Good  OT Frequency: Min 2X/week              AM-PAC OT "6 Clicks" Daily Activity     Outcome Measure Help from another person eating meals?: Total(NPO) Help from another person taking care of personal grooming?: A Little Help from another person toileting, which includes using toliet, bedpan, or urinal?: A Little Help from another person bathing (including washing, rinsing, drying)?: A Little Help from another person to put on and taking off regular upper body clothing?: A Little Help from another person to put on and taking off regular lower body clothing?: A Little 6 Click Score: 16   End of Session Equipment Utilized During Treatment: Gait belt Nurse Communication: Mobility status  Activity Tolerance: Patient tolerated treatment well Patient left: in chair;with call bell/phone within reach;with chair alarm set  OT Visit Diagnosis: Unsteadiness on feet (R26.81);Cognitive communication deficit (R41.841) Symptoms and signs involving cognitive functions: Cerebral infarction                Time: 4315-4008 OT Time Calculation (min): 27 min Charges:  OT General Charges $OT Visit: 1 Visit OT Evaluation $OT Eval Moderate Complexity: 1 Mod OT Treatments $Self Care/Home Management : 8-22 mins  [place], OTR/L Acute [employer] Pager [phone] Office [phone]    Almon Register 03/10/2019,  3:57 PM

## 2019-03-10 NOTE — Progress Notes (Signed)
ANTICOAGULATION CONSULT NOTE   Pharmacy Consult for Heparin Indication: atrial fibrillation and stroke  No Known Allergies  Patient Measurements: Height: 5\' 8"  (172.7 cm) Weight: 140 lb 10.5 oz (63.8 kg) IBW/kg (Calculated) : 68.4 Heparin Dosing Weight: 63.8 kg  Vital Signs: Temp: 98.4 F (36.9 C) (08/03 2000) Temp Source: Oral (08/03 2000) BP: 138/71 (08/03 1900) Pulse Rate: 66 (08/03 1900)  Labs: Recent Labs    03/08/19 0906 03/09/19 0215 03/09/19 0656  03/10/19 0545 03/10/19 1235 03/10/19 2034  HGB 12.8*  --  12.5*  --  10.8*  --   --   HCT 37.5*  --  36.5*  --  31.2*  --   --   PLT 124*  --  125*  --  140*  --   --   APTT  --  >200*  --    < > 51* 25 88*  LABPROT  --  15.7*  --   --   --   --   --   INR  --  1.3*  --   --   --   --   --   HEPARINUNFRC  --   --   --    < > <0.10* <0.10* 0.28*  CREATININE 0.68  --  0.70  --   --   --   --    < > = values in this interval not displayed.    Estimated Creatinine Clearance: 85.3 mL/min (by C-G formula based on SCr of 0.7 mg/dL).   Medical History: Past Medical History:  Diagnosis Date  . AF (paroxysmal atrial fibrillation) ([place]) 11/29/2018  . Atrial flutter ([place]) 10/12/2015  . CAD (coronary artery disease), native coronary artery 12/06/2015   a. 10/2015 MV: EF  37%, reversible defect inferior apex, intermediate risk findings; b. 10/2015 Cath: 20% mid RCA.  . Colonic diverticular abscess   . Diverticulitis   . History of chemotherapy 2005   Cisplatin  . Hypothyroidism   . NICM (nonischemic cardiomyopathy) ([place]) 10/14/2015   a. Tachy mediated?;  b. Echo 3/17 - Mild concentric LVH, EF 30-35%, anteroseptal, anterior, anterolateral, apical anterior, lateral hypokinesis, trivial MR, mild to moderately reduced RVSF; c. LHC 3/17 - mRCA 20%  . Paroxysmal atrial flutter ([place])    a. TEE 3/17 with ? LAA clot-->s/p TEE/DCCV 11/24/2015  . Radiation NOv.3,2005-Dec. 15, 2005   6810 cGy in 30 fractions  . Tonsil cancer [facility]) 2005   Dr Romeo Rabon.  XRT    Assessment: 63 year old male on Eliquis prior to admission for atrial fibrillation. Patient was admitted to [facility] long on 7/31 for robot assisted low anterior resection and appendectomy and Eliquis had been held for surgery - last dose on 7/28. POD#1 patient began to experience stroke symptoms and L-MCA stroke noted on CT. Patient was transferred to [facility] for IR intervention. Attempted mechanical thrombectomy was unsuccessful and carotid artery was not amenable to stent. Pharmacy consulted to dose heparin.   Heparin level 0.28 and aptt at goal at 88sec. No bleeding noted, no rate changes tonight. At this point will rely on heparin levels alone and will stop checking aptt. Given lower goal will only make small heparin rate adjustment and follow up with am labs.   Goal of Therapy:  Heparin level 0.3 to 0.5 units/ml aPTT 66-85 seconds Monitor platelets by anticoagulation protocol: Yes   Plan:  Increase Heparin to 1350 units/hr Check heparin level in am Monitor for bleeding and mental status changes.   Erin Hearing  PharmD., BCPS Clinical Pharmacist 03/10/2019 9:18 PM

## 2019-03-10 NOTE — Progress Notes (Signed)
ANTICOAGULATION CONSULT NOTE - Consult  Pharmacy Consult for Heparin Indication: atrial fibrillation and stroke  No Known Allergies  Patient Measurements: Height: 5\' 8"  (172.7 cm) Weight: 140 lb 10.5 oz (63.8 kg) IBW/kg (Calculated) : 68.4 Heparin Dosing Weight: 63.8 kg  Vital Signs: Temp: 98.4 F (36.9 C) (08/03 0448) Temp Source: Oral (08/03 0448) BP: 133/82 (08/03 0615) Pulse Rate: 62 (08/03 0615)  Labs: Recent Labs    03/08/19 0906  03/09/19 0215 03/09/19 0656 03/09/19 1429 03/09/19 2127 03/10/19 0545  HGB 12.8*  --   --  12.5*  --   --  10.8*  HCT 37.5*  --   --  36.5*  --   --  31.2*  PLT 124*  --   --  125*  --   --  140*  APTT  --    < > >200*  --  39* 55* 51*  LABPROT  --   --  15.7*  --   --   --   --   INR  --   --  1.3*  --   --   --   --   HEPARINUNFRC  --   --   --   --  0.17* 0.13* <0.10*  CREATININE 0.68  --   --  0.70  --   --   --    < > = values in this interval not displayed.    Estimated Creatinine Clearance: 85.3 mL/min (by C-G formula based on SCr of 0.7 mg/dL).   Medical History: Past Medical History:  Diagnosis Date  . AF (paroxysmal atrial fibrillation) ([place]) 11/29/2018  . Atrial flutter ([place]) 10/12/2015  . CAD (coronary artery disease), native coronary artery 12/06/2015   a. 10/2015 MV: EF  37%, reversible defect inferior apex, intermediate risk findings; b. 10/2015 Cath: 20% mid RCA.  . Colonic diverticular abscess   . Diverticulitis   . History of chemotherapy 2005   Cisplatin  . Hypothyroidism   . NICM (nonischemic cardiomyopathy) ([place]) 10/14/2015   a. Tachy mediated?;  b. Echo 3/17 - Mild concentric LVH, EF 30-35%, anteroseptal, anterior, anterolateral, apical anterior, lateral hypokinesis, trivial MR, mild to moderately reduced RVSF; c. LHC 3/17 - mRCA 20%  . Paroxysmal atrial flutter ([place])    a. TEE 3/17 with ? LAA clot-->s/p TEE/DCCV 11/24/2015  . Radiation NOv.3,2005-Dec. 15, 2005   6810 cGy in 30 fractions  . Tonsil cancer [facility])  2005   Dr Romeo Rabon.  XRT    Assessment: 63 year old male on Eliquis prior to admission for atrial fibrillation. Patient was admitted to [facility] long on 7/31 for robot assisted low anterior resection and appendectomy and Eliquis had been held for surgery - last dose on 7/28. POD#1 patient began to experience stroke symptoms and L-MCA stroke noted on CT. Patient was transferred to [facility] for IR intervention. Attempted mechanical thrombectomy was unsuccessful and carotid artery was not amenable to stent. Pharmacy consulted to dose heparin. CBC and Scr stable.  Night update:  HL <0.10  APTT 51 - subtherapeutic   Goal of Therapy:  Heparin level 0.3 to 0.5 units/ml aPTT 66-85 seconds Monitor platelets by anticoagulation protocol: Yes   Plan:  Increase Heparin to 1100 units/hr Check APTT/HL in 6 hours  Monitor for bleeding and mental status changes.    Francile Woolford A. Levada Dy, PharmD, BCPS, FNKF Clinical Pharmacist [place] Please utilize Amion for appropriate phone number to reach the unit pharmacist ([place])

## 2019-03-10 NOTE — Progress Notes (Signed)
Referring Physician(s): Dr. Leonie Man  Supervising Physician: Luanne Bras  Patient Status:  [name] - In-pt  Chief Complaint: Occluded L ICA  Subjective: Patient resting comfortably.  Patient with right facial droop, mild aphasia.  Moving all extremities.   Allergies: Patient has no known allergies.  Medications: Prior to Admission medications   Medication Sig Start Date End Date Taking? Authorizing Provider  apixaban (ELIQUIS) 5 MG TABS tablet Take 1 tablet (5 mg total) by mouth 2 (two) times daily. Patient not taking: Reported on 03/05/2019 02/08/18  Yes Turner, Eber Hong, MD  atorvastatin (LIPITOR) 40 MG tablet Take 1 tablet (40 mg total) by mouth daily. Please keep upcoming appt in September for future refills. Thank you Patient taking differently: Take 40 mg by mouth 3 (three) times a week.  03/01/18  Yes Imogene Burn, PA-C  levothyroxine (SYNTHROID) 125 MCG tablet TAKE 1 TABLET BY MOUTH EVERY DAY Patient taking differently: Take 125 mcg by mouth daily before breakfast.  12/16/18  Yes Burchette, Alinda Sierras, MD  nebivolol (BYSTOLIC) 10 MG tablet Take 1 tablet (10 mg total) by mouth daily. 05/21/18  Yes Turner, Eber Hong, MD  acetaminophen (TYLENOL) 325 MG tablet Take 2 tablets (650 mg total) by mouth every 6 (six) hours as needed for mild pain (or Fever >/= 101). Patient not taking: Reported on 03/06/2019 12/04/18   Eugenie Filler, MD  Multiple Vitamin (MULTIVITAMIN WITH MINERALS) TABS tablet Take 1 tablet by mouth daily. Patient not taking: Reported on 02/27/2019 12/05/18   Eugenie Filler, MD  traMADol (ULTRAM) 50 MG tablet Take 1-2 tablets (50-100 mg total) by mouth every 6 (six) hours as needed for moderate pain or severe pain. 03/07/19   Michael Boston, MD     Vital Signs: BP 139/72    Pulse (!) 56    Temp 98.7 F (37.1 C) (Axillary)    Resp (!) 22    Ht 5\' 8"  (1.727 m)    Wt 140 lb 10.5 oz (63.8 kg)    SpO2 100%    BMI 21.39 kg/m   Physical Exam Vitals signs and  nursing note reviewed.   Lying in bed, no acute distress. Neuro: R facial droop, mild dysarthria; intelligible with slow paced speech. EOMs intact.  Upper and lower bilateral extremities with strength intact at 5/5.   Skin: Groin intact.  Small area of swelling directly at puncture site.  Small amount of bleeding on dressing which is dry.  No active bleed or oozing.    Imaging: Ct Angio Head W Or Wo Contrast  Result Date: 03/08/2019 CLINICAL DATA:  Expressive aphasia.  Left hyperdense MCA at head CT. EXAM: CT ANGIOGRAPHY HEAD AND NECK TECHNIQUE: Multidetector CT imaging of the head and neck was performed using the standard protocol during bolus administration of intravenous contrast. Multiplanar CT image reconstructions and MIPs were obtained to evaluate the vascular anatomy. Carotid stenosis measurements (when applicable) are obtained utilizing NASCET criteria, using the distal internal carotid diameter as the denominator. CONTRAST:  133mL OMNIPAQUE IOHEXOL 350 MG/ML SOLN COMPARISON:  Head CT earlier same day. FINDINGS: CTA NECK FINDINGS Aortic arch: Mild aortic atherosclerosis. No aneurysm or dissection. Left vertebral artery arises directly from the arch. Right carotid system: Common carotid artery widely patent to the bifurcation. Mild atherosclerotic disease at carotid bifurcation but no stenosis or irregularity. Cervical ICA widely patent. Left carotid system: Common carotid artery is widely patent to the bifurcation. Advanced atherosclerotic disease at the carotid bifurcation with soft and calcified  plaque and occlusion of the ICA at the bulb. No reconstituted flow in the cervical region. Small amount of reconstituted flow in the carotid canal. Vertebral arteries: Both vertebral artery origins are widely patent. Left vertebral artery arises from the arch as noted above. Both vertebral arteries widely patent through the cervical region. Skeleton: Cervical spondylosis.  Previous ACDF C5-6. Other neck:  No mass or adenopathy. Upper chest: Pleural and parenchymal scarring at both lung apices. Review of the MIP images confirms the above findings CTA HEAD FINDINGS Anterior circulation: Right internal carotid artery is widely patent through the skull base and siphon region. Mild siphon atherosclerotic calcification. Right anterior and middle cerebral arteries are widely patent. Left internal carotid artery shows mild reconstituted flow in the carotid canal and flow in the siphon region. Siphon atherosclerotic calcification. Left anterior cerebral artery is widely patent. Left M1 segment is widely patent. Occluded left M2 branch as suggested by noncontrast CT. Posterior circulation: Both vertebral arteries are widely patent to the basilar. No basilar stenosis. Posterior circulation branch vessels are patent. Venous sinuses: Patent and normal. Anatomic variants: None significant. Review of the MIP images confirms the above findings IMPRESSION: Acute occlusion of the left ICA at its origin. Reconstitution of the ICA by collaterals at the skull base level. Occluded left M2 branch. These results were called by telephone at the time of interpretation on 03/08/2019 at 9:55 pm to Dr. Armandina Gemma , who verbally acknowledged these results. Electronically Signed   By: Nelson Chimes M.D.   On: 03/08/2019 22:00   Ct Angio Neck W Or Wo Contrast  Result Date: 03/08/2019 CLINICAL DATA:  Expressive aphasia.  Left hyperdense MCA at head CT. EXAM: CT ANGIOGRAPHY HEAD AND NECK TECHNIQUE: Multidetector CT imaging of the head and neck was performed using the standard protocol during bolus administration of intravenous contrast. Multiplanar CT image reconstructions and MIPs were obtained to evaluate the vascular anatomy. Carotid stenosis measurements (when applicable) are obtained utilizing NASCET criteria, using the distal internal carotid diameter as the denominator. CONTRAST:  153mL OMNIPAQUE IOHEXOL 350 MG/ML SOLN COMPARISON:  Head CT  earlier same day. FINDINGS: CTA NECK FINDINGS Aortic arch: Mild aortic atherosclerosis. No aneurysm or dissection. Left vertebral artery arises directly from the arch. Right carotid system: Common carotid artery widely patent to the bifurcation. Mild atherosclerotic disease at carotid bifurcation but no stenosis or irregularity. Cervical ICA widely patent. Left carotid system: Common carotid artery is widely patent to the bifurcation. Advanced atherosclerotic disease at the carotid bifurcation with soft and calcified plaque and occlusion of the ICA at the bulb. No reconstituted flow in the cervical region. Small amount of reconstituted flow in the carotid canal. Vertebral arteries: Both vertebral artery origins are widely patent. Left vertebral artery arises from the arch as noted above. Both vertebral arteries widely patent through the cervical region. Skeleton: Cervical spondylosis.  Previous ACDF C5-6. Other neck: No mass or adenopathy. Upper chest: Pleural and parenchymal scarring at both lung apices. Review of the MIP images confirms the above findings CTA HEAD FINDINGS Anterior circulation: Right internal carotid artery is widely patent through the skull base and siphon region. Mild siphon atherosclerotic calcification. Right anterior and middle cerebral arteries are widely patent. Left internal carotid artery shows mild reconstituted flow in the carotid canal and flow in the siphon region. Siphon atherosclerotic calcification. Left anterior cerebral artery is widely patent. Left M1 segment is widely patent. Occluded left M2 branch as suggested by noncontrast CT. Posterior circulation: Both vertebral arteries are  widely patent to the basilar. No basilar stenosis. Posterior circulation branch vessels are patent. Venous sinuses: Patent and normal. Anatomic variants: None significant. Review of the MIP images confirms the above findings IMPRESSION: Acute occlusion of the left ICA at its origin. Reconstitution of  the ICA by collaterals at the skull base level. Occluded left M2 branch. These results were called by telephone at the time of interpretation on 03/08/2019 at 9:55 pm to Dr. Armandina Gemma , who verbally acknowledged these results. Electronically Signed   By: Nelson Chimes M.D.   On: 03/08/2019 22:00   Mr Brain Wo Contrast  Result Date: 03/09/2019 CLINICAL DATA:  63 year old male emergent left ICA and left M2 Large Vessel occlusion status post endovascular treatment. EXAM: MRI HEAD WITHOUT CONTRAST TECHNIQUE: Multiplanar, multiecho pulse sequences of the brain and surrounding structures were obtained without intravenous contrast. COMPARISON:  CT head, CTA head and neck and CT perfusion 03/08/2019. FINDINGS: Brain: Restricted diffusion in the left MCA middle to anterior division territory involving most of the insula, left operculum cortex, cortex of the left inferior frontal gyrus and tracking cephalad to the left middle frontal gyrus (series 7, image 65). This is similar to the estimated core infarct distribution on CTP, perhaps slightly increased in extent. Associated cytotoxic edema with T2 and FLAIR hyperintensity. Superimposed small area of superior left parietal lobe cortical encephalomalacia on series 9, image 21. Trace petechial hemorrhage at the left insula on series 12, image 26. No contralateral right hemisphere or posterior fossa restricted diffusion. No midline shift, mass effect, evidence of mass lesion, ventriculomegaly, extra-axial collection or acute intracranial hemorrhage. Cervicomedullary junction and pituitary are within normal limits. Vascular: Major intracranial vascular flow voids are preserved, although the left ICA petrous segment flow void is diminished on series 15, image 6. There is some asymmetric left MCA branch FLAIR signal (series 9, image 13). Skull and upper cervical spine: Chronic disc and endplate degeneration at C3-C4 with mild spinal stenosis at that level. There is heterogeneous  marrow signal in the clivus on series 14, image 12 which is also conspicuous on diffusion-weighted imaging., but elsewhere visible bone marrow signal is within normal limits. This area appeared normal by CT. Sinuses/Orbits: Negative orbits. Right frontal and frontoethmoidal sinus disease redemonstrated. Other: Mastoids are clear. Visible internal auditory structures appear normal. Scalp and face soft tissues appear negative. IMPRESSION: 1. Acute Left MCA infarct involving the middle/anterior division territory similar to the CTP core estimate. Trace petechial hemorrhage (Heidelberg Classification 1a). No mass effect. 2. Asymmetrically decreased Left ICA petrous segment flow void, but otherwise preserved major vascular flow voids. 3. Indeterminate abnormal marrow signal in the clivus, which had an unremarkable CT appearance yesterday. Recommend follow-up Two-view Chest Radiographs when possible, and if negative a 3 month follow-up noncontrast Brain MRI to re-evaluate the clivus. Electronically Signed   By: Genevie Ann M.D.   On: 03/09/2019 13:21   Ct Cerebral Perfusion W Contrast  Result Date: 03/08/2019 CLINICAL DATA:  Initial evaluation for acute stroke, known left ICA and left M2 occlusion as seen on prior CTA. EXAM: CT PERFUSION BRAIN TECHNIQUE: Multiphase CT imaging of the brain was performed following IV bolus contrast injection. Subsequent parametric perfusion maps were calculated using RAPID software. CONTRAST:  27mL OMNIPAQUE IOHEXOL 350 MG/ML SOLN COMPARISON:  Prior CTA from earlier same day. FINDINGS: CT Brain Perfusion Findings: CBF (<30%) Volume: 50mL Perfusion (Tmax>6.0s) volume: 173mL Mismatch Volume: 71mL ASPECTS on noncontrast CT Head: 10 at  21:10 today. Infarct Core: 20 mL Infarction  Location:Acute core infarct seen involving the anterior left frontal lobe, left MCA distribution. Moderate surrounding ischemic penumbra. IMPRESSION: Acute core infarct involving the anterior left MCA distribution with  moderate surrounding ischemic penumbra as above. Electronically Signed   By: Jeannine Boga M.D.   On: 03/08/2019 23:56   Dg Swallowing Func-speech Pathology  Result Date: 03/10/2019 Objective Swallowing Evaluation: Type of Study: MBS-Modified Barium Swallow Study  Patient Details Name: PAITON FOSCO MRN: [id number] Date of Birth: [birth year] Today's Date: 03/10/2019 Time: SLP Start Time (ACUTE ONLY): 0907 -SLP Stop Time (ACUTE ONLY): 0926 SLP Time Calculation (min) (ACUTE ONLY): 19 min Past Medical History: Past Medical History: Diagnosis Date  AF (paroxysmal atrial fibrillation) ([place]) 11/29/2018  Atrial flutter ([place]) 10/12/2015  CAD (coronary artery disease), native coronary artery 12/06/2015  a. 10/2015 MV: EF  37%, reversible defect inferior apex, intermediate risk findings; b. 10/2015 Cath: 20% mid RCA.  Colonic diverticular abscess   Diverticulitis   History of chemotherapy 2005  Cisplatin  Hypothyroidism   NICM (nonischemic cardiomyopathy) ([place]) 10/14/2015  a. Tachy mediated?;  b. Echo 3/17 - Mild concentric LVH, EF 30-35%, anteroseptal, anterior, anterolateral, apical anterior, lateral hypokinesis, trivial MR, mild to moderately reduced RVSF; c. LHC 3/17 - mRCA 20%  Paroxysmal atrial flutter ([place])   a. TEE 3/17 with ? LAA clot-->s/p TEE/DCCV 11/24/2015  Radiation NOv.3,2005-Dec. 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  XRT Past Surgical History: Past Surgical History: Procedure Laterality Date  APPENDECTOMY N/A 03/07/2019  Procedure: ROBOTIC ASSISTED APPENDECTOMY;  Surgeon: Michael Boston, MD;  Location: [facility];  Service: General;  Laterality: N/A;  CARDIAC CATHETERIZATION N/A 10/18/2015  Procedure: Left Heart Cath and Coronary Angiography;  Surgeon: Burnell Blanks, MD;  Location: [facility];  Service: Cardiovascular;  Laterality: N/A;  CARDIOVERSION N/A 11/24/2015  Procedure: CARDIOVERSION;  Surgeon: Josue Hector, MD;  Location: [place];   Service: Cardiovascular;  Laterality: N/A;  CYSTOSCOPY WITH STENT PLACEMENT Bilateral 03/07/2019  Procedure: CYSTOSCOPY WITH BILATERAL FIREFLY INJECTION;  Surgeon: Ardis Hughs, MD;  Location: [facility];  Service: Urology;  Laterality: Bilateral;  GASTROSTOMY TUBE PLACEMENT  07/04/2004  IR - G tube for tonsilar cancer  IR RADIOLOGIST EVAL & MGMT  12/18/2018  LAMINECTOMY    C5/placement of steel plate  NECK SURGERY  2003  replaced disk  TEE WITHOUT CARDIOVERSION N/A 10/15/2015  Procedure: TRANSESOPHAGEAL ECHOCARDIOGRAM (TEE);  Surgeon: Larey Dresser, MD;  Location: [place];  Service: Cardiovascular;  Laterality: N/A;  TEE WITHOUT CARDIOVERSION N/A 11/24/2015  Procedure: TRANSESOPHAGEAL ECHOCARDIOGRAM (TEE);  Surgeon: Josue Hector, MD;  Location: [facility] ENDOS[facility];  Service: Cardiovascular;  Laterality: N/A;  XI ROBOTIC ASSISTED COLOSTOMY TAKEDOWN N/A 03/07/2019  Procedure: XI ROBOTIC ASSISTED LOW ANTERIOR RESECTION, RIGID PROCTOSCOPY;  Surgeon: Michael Boston, MD;  Location: [facility];  Service: General;  Laterality: N/A; HPI: HEWITT GARNER is a 63 y.o. male coronary artery disease, tonsillar ca in remission, atrial fibrillation on anticoagulation which was held for surgery since last Tuesday, transferred emergently from [facility] long hospital where he was admitted for diverticulitis of the large intestine with abscess requiring exploratory laparotomy, low anterior resection rigid proctoscopy, appendectomy, last seen normal at 7:30 PM on 03/08/2019 at [facility] when it was noticed that he had difficulty with speech- speech did not make sense. CTA head and neck was also done which revealed a left cervical carotid occlusion as well as left MCA occlusion. S/P bilateral common carotid  and RT vertbral arteriograms followed by attempted revascularization of occluded Lt ICA prox .MCA revascularization via collaterals TICI 2b to 2C . MRI shows Restricted diffusion in the left MCA middle to anterior division  territory involving most of the insula, left operculum  Subjective: pt alert, aphasic Assessment / Plan / Recommendation CHL IP CLINICAL [name] 03/10/2019 Clinical Impression Pt has a moderate to severe oropharyngeal dysphagia that is suspected to be acute on chronic given new stroke in addition to prior ACDF and XRT for tonsillar cancer. His oral phase is mildly prolonged, more so as boluses become thicker and mor solid. Bolus cohesion is reduced and spills backward into the pharynx not as a formed bolus, at times using a posterior head tilt to initiate posterior propulsion (making chin tuck a difficult strategy to use). Pharyngeally he exhibits reduced pharyngeal squeeze, base of tongue retraction, and hyolaryngeal movement. Epiglottic inversion and airway closure are reduced. He often penetrates during the swallow as a result. More significantly, he also has frequent aspiration before the swallow with thin liquids (a combined result of oral and pharyngeal impairments) and after the swallow with all consistencies. Moderate residue primarily at the valleculae with thin liquids increases up to more severe residue also at the pyriform sinuses with limited entrance into the UES with purees. Varying volume and bolus presentation did not increase airway protection or efficiency of swallow. Most aspiration goes unsensed, and his cued cough and intermittent spontaneous are only able to partially clear the airway. Recommend that he remain NPO with consideration of temporary, alternative means of nutrition. Could also provided a few single ice chips after oral care to utilize swallowing musculature and facilitate secretion management. Will f/u for exercises to maximize swallow and cough as well as completion of speech-language evaluation.  SLP Visit Diagnosis Dysphagia, oropharyngeal phase (R13.12) Attention and concentration deficit following -- Frontal lobe and executive function deficit following -- Impact on safety  and function Severe aspiration risk   CHL IP TREATMENT RECOMMENDATION 03/10/2019 Treatment Recommendations Therapy as outlined in treatment plan below   Prognosis 03/10/2019 Prognosis for Safe Diet Advancement Good Barriers to Reach Goals Severity of deficits Barriers/Prognosis Comment -- CHL IP DIET RECOMMENDATION 03/10/2019 SLP Diet Recommendations NPO;Alternative means - temporary Liquid Administration via -- Medication Administration Via alternative means Compensations -- Postural Changes --   CHL IP OTHER RECOMMENDATIONS 03/10/2019 Recommended Consults -- Oral Care Recommendations Oral care QID Other Recommendations Have oral suction available   CHL IP FOLLOW UP RECOMMENDATIONS 03/10/2019 Follow up Recommendations Inpatient Rehab   CHL IP FREQUENCY AND DURATION 03/10/2019 Speech Therapy Frequency (ACUTE ONLY) min 2x/week Treatment Duration 2 weeks      CHL IP ORAL PHASE 03/10/2019 Oral Phase Impaired Oral - Pudding Teaspoon -- Oral - Pudding Cup -- Oral - Honey Teaspoon -- Oral - Honey Cup Reduced posterior propulsion;Decreased bolus cohesion Oral - Nectar Teaspoon Reduced posterior propulsion;Decreased bolus cohesion Oral - Nectar Cup Reduced posterior propulsion;Decreased bolus cohesion Oral - Nectar Straw -- Oral - Thin Teaspoon Reduced posterior propulsion;Decreased bolus cohesion Oral - Thin Cup Reduced posterior propulsion;Decreased bolus cohesion Oral - Thin Straw -- Oral - Puree Reduced posterior propulsion;Delayed oral transit;Decreased bolus cohesion Oral - Mech Soft -- Oral - Regular -- Oral - Multi-Consistency -- Oral - Pill -- Oral Phase - Comment --  CHL IP PHARYNGEAL PHASE 03/10/2019 Pharyngeal Phase -- Pharyngeal- Pudding Teaspoon -- Pharyngeal -- Pharyngeal- Pudding Cup -- Pharyngeal -- Pharyngeal- Honey Teaspoon -- Pharyngeal -- Pharyngeal- Honey Cup Reduced pharyngeal peristalsis;Reduced epiglottic  inversion;Reduced anterior laryngeal mobility;Reduced laryngeal elevation;Reduced airway/laryngeal  closure;Reduced tongue base retraction;Pharyngeal residue - valleculae;Pharyngeal residue - pyriform;Penetration/Apiration after swallow Pharyngeal Material enters airway, passes BELOW cords without attempt by patient to eject out (silent aspiration) Pharyngeal- Nectar Teaspoon Reduced pharyngeal peristalsis;Reduced epiglottic inversion;Reduced anterior laryngeal mobility;Reduced laryngeal elevation;Reduced airway/laryngeal closure;Reduced tongue base retraction;Pharyngeal residue - valleculae;Penetration/Apiration after swallow;Pharyngeal residue - pyriform Pharyngeal Material enters airway, passes BELOW cords without attempt by patient to eject out (silent aspiration) Pharyngeal- Nectar Cup Reduced pharyngeal peristalsis;Reduced epiglottic inversion;Reduced anterior laryngeal mobility;Reduced laryngeal elevation;Reduced airway/laryngeal closure;Reduced tongue base retraction;Pharyngeal residue - valleculae;Penetration/Apiration after swallow;Pharyngeal residue - pyriform Pharyngeal Material enters airway, passes BELOW cords without attempt by patient to eject out (silent aspiration) Pharyngeal- Nectar Straw -- Pharyngeal -- Pharyngeal- Thin Teaspoon Reduced pharyngeal peristalsis;Reduced epiglottic inversion;Reduced anterior laryngeal mobility;Reduced laryngeal elevation;Reduced airway/laryngeal closure;Reduced tongue base retraction;Pharyngeal residue - valleculae Pharyngeal -- Pharyngeal- Thin Cup Reduced pharyngeal peristalsis;Reduced epiglottic inversion;Reduced anterior laryngeal mobility;Reduced laryngeal elevation;Reduced airway/laryngeal closure;Reduced tongue base retraction;Pharyngeal residue - valleculae;Penetration/Aspiration before swallow;Penetration/Apiration after swallow;Pharyngeal residue - pyriform Pharyngeal Material enters airway, passes BELOW cords without attempt by patient to eject out (silent aspiration);Material enters airway, passes BELOW cords and not ejected out despite cough attempt  by patient Pharyngeal- Thin Straw -- Pharyngeal -- Pharyngeal- Puree Reduced pharyngeal peristalsis;Reduced epiglottic inversion;Reduced anterior laryngeal mobility;Reduced laryngeal elevation;Reduced airway/laryngeal closure;Reduced tongue base retraction;Pharyngeal residue - valleculae;Pharyngeal residue - pyriform Pharyngeal -- Pharyngeal- Mechanical Soft -- Pharyngeal -- Pharyngeal- Regular -- Pharyngeal -- Pharyngeal- Multi-consistency -- Pharyngeal -- Pharyngeal- Pill -- Pharyngeal -- Pharyngeal Comment --  CHL IP CERVICAL ESOPHAGEAL PHASE 03/10/2019 Cervical Esophageal Phase Impaired Pudding Teaspoon -- Pudding Cup -- Honey Teaspoon -- Honey Cup Reduced cricopharyngeal relaxation Nectar Teaspoon Reduced cricopharyngeal relaxation Nectar Cup Reduced cricopharyngeal relaxation Nectar Straw -- Thin Teaspoon Reduced cricopharyngeal relaxation Thin Cup Reduced cricopharyngeal relaxation Thin Straw -- Puree Reduced cricopharyngeal relaxation Mechanical Soft -- Regular -- Multi-consistency -- Pill -- Cervical Esophageal Comment -- Venita Sheffield Nix 03/10/2019, 10:37 AM  [name], M.A. [facility] Pager [phone] Office [phone]             Ct Head Code Stroke Wo Contrast  Result Date: 03/08/2019 CLINICAL DATA:  Code stroke.  Expressive aphasia.  Slurred speech. EXAM: CT HEAD WITHOUT CONTRAST TECHNIQUE: Contiguous axial images were obtained from the base of the skull through the vertex without intravenous contrast. COMPARISON:  None. FINDINGS: Brain: No evidence of old or acute infarction, mass lesion, hemorrhage, hydrocephalus or extra-axial collection. Vascular: Hyperdense left MCA branch, probably M2. Skull: Normal Sinuses/Orbits: Right frontal sinus opacification.  Orbits negative. Other: None ASPECTS ([place] Stroke Program Early CT Score) - Ganglionic level infarction (caudate, lentiform nuclei, internal capsule, insula, M1-M3 cortex): 7 - Supraganglionic infarction (M4-M6 cortex):  3 Total score (0-10 with 10 being normal): 10 IMPRESSION: 1. Normal appearance of the brain itself. However, there is a definite hyperdense left M2 branch consistent with an acute embolus. Right frontal sinusitis. 2. ASPECTS is 10. 3. These results were called by telephone at the time of interpretation on 03/08/2019 at 9:26 pm to Dr. Harlow Asa, who verbally acknowledged these results. Electronically Signed   By: Nelson Chimes M.D.   On: 03/08/2019 21:31    Labs:  CBC: Recent Labs    03/05/19 0944 03/08/19 0906 03/09/19 0656 03/10/19 0545  WBC 4.1 3.1* 5.0 4.7  HGB 15.1 12.8* 12.5* 10.8*  HCT 46.6 37.5* 36.5* 31.2*  PLT 168 124* 125* 140*    COAGS: Recent Labs    11/29/18 2109  03/09/19 0215 03/09/19 1429 03/09/19 2127 03/10/19 0545  INR 1.1 1.3*  --   --   --   APTT  --  >200* 39* 55* 51*    BMP: Recent Labs    12/04/18 0323 03/05/19 0944 03/08/19 0906 03/09/19 0656  NA 136 137 136 139  K 4.1 4.9 4.1 3.8  CL 103 101 102 104  CO2 26 28 27 25   GLUCOSE 102* 110* 135* 98  BUN 16 14 10  7*  CALCIUM 8.3* 9.0 8.5* 8.4*  CREATININE 0.68 0.70 0.68 0.70  GFRNONAA >60 >60 >60 >60  GFRAA >60 >60 >60 >60    LIVER FUNCTION TESTS: Recent Labs    09/23/18 0948 09/24/18 0153 11/29/18 1647  BILITOT 0.7 1.0 0.6  AST 18 14* 13*  ALT 12 10 13   ALKPHOS 61 51 73  PROT 7.3 5.9* 7.5  ALBUMIN 3.3* 2.5* 3.0*    Assessment and Plan: L ICA occlusion s/p unsuccessful revascularization attempt 8/2 by Dr. Estanislado Pandy. Patient stable this AM. Remains dysarthric but improved.  On heparin.  Groin with focal area of swelling.  Mildly tender.  Small amount of old blood to dressing.  No evidence of hematoma or pseudoaneurysm.  Monitor.  Contact IR if needed.    Electronically Signed: Docia Barrier, PA 03/10/2019, 11:03 AM   I spent a total of 15 Minutes at the the patient's bedside AND on the patient's hospital floor or unit, greater than 50% of which was counseling/coordinating  care for L ICA occlusion.

## 2019-03-10 NOTE — Progress Notes (Signed)
STROKE TEAM PROGRESS NOTE   SUBJECTIVE (INTERVAL HISTORY) Patient just returned from modified barium swallow and did not do well and was advised to be n.p.o.  He continues to have expressive aphasia but is able to speak a few words and short sentences.  His comprehension remains good.  He is on IV heparin.  Vital signs stable.  He remains on phenylephrine for induced hypertension. OBJECTIVE Vitals:   03/10/19 1130 03/10/19 1145 03/10/19 1200 03/10/19 1215  BP: (!) 136/114 125/85 (!) 160/77 (!) 148/82  Pulse: (!) 56 64 (!) 54 (!) 54  Resp: (!) 22 (!) 26 18 16   Temp:      TempSrc:      SpO2: 99% 100% 100% 100%  Weight:      Height:        CBC:  Recent Labs  Lab 03/09/19 0656 03/10/19 0545  WBC 5.0 4.7  NEUTROABS 0.9*  --   HGB 12.5* 10.8*  HCT 36.5* 31.2*  MCV 91.9 89.7  PLT 125* 140*    Basic Metabolic Panel:  Recent Labs  Lab 03/08/19 0906 03/09/19 0656  NA 136 139  K 4.1 3.8  CL 102 104  CO2 27 25  GLUCOSE 135* 98  BUN 10 7*  CREATININE 0.68 0.70  CALCIUM 8.5* 8.4*  MG 1.8  --     Lipid Panel:     Component Value Date/Time   CHOL 110 03/09/2019 0656   CHOL 159 03/09/2017 1032   TRIG 97 03/09/2019 0656   HDL 45 03/09/2019 0656   HDL 61 03/09/2017 1032   CHOLHDL 2.4 03/09/2019 0656   VLDL 19 03/09/2019 0656   LDLCALC 46 03/09/2019 0656   LDLCALC 66 03/09/2017 1032   HgbA1c:  Lab Results  Component Value Date   HGBA1C 5.5 03/09/2019    IMAGING Ct Head Code Stroke Wo Contrast 03/08/2019 1. Normal appearance of the brain itself. However, there is a definite hyperdense left M2 branch consistent with an acute embolus. Right frontal sinusitis.  2. ASPECTS is 10.   Ct Angio Head W Or Wo Contrast Ct Angio Neck W Or Wo Contrast 03/08/2019 Acute occlusion of the left ICA at its origin. Reconstitution of the ICA by collaterals at the skull base level. Occluded left M2 branch.  Ct Cerebral Perfusion W Contrast 03/08/2019 Acute core infarct involving the  anterior left MCA distribution with moderate surrounding ischemic penumbra as above.   Interventional Radiology 03/09/2019 S/P bilateral common carotid and RT vertbral arteriograms followed by attempted revascularization of occluded Lt ICA prox . MCA revascularization via collaterals TICI 2b to 2C .  MRI Head WO Contrast  03/09/2019 1. Acute Left MCA infarct involving the middle/anterior division territory similar to the CTP core estimate. Trace petechial hemorrhage (Heidelberg Classification 1a). No mass effect. 2. Asymmetrically decreased Left ICA petrous segment flow void, but otherwise preserved major vascular flow voids. 3. Indeterminate abnormal marrow signal in the clivus, which had an unremarkable CT appearance yesterday. Recommend follow-up Two-view Chest Radiographs when possible, and if negative a 3 month follow-up noncontrast Brain MRI to re-evaluate the clivus.   Transthoracic Echocardiogram  8/2//2020  1. The left ventricle has normal systolic function with an ejection fraction of 60-65%. The cavity size was normal. Left ventricular diastolic parameters were normal.  2. The right ventricle has normal systolic function. The cavity was normal. There is no increase in right ventricular wall thickness.  3. No evidence of mitral valve stenosis.  4. The tricuspid valve is grossly normal.  5. No stenosis of the aortic valve.  6. The aorta is normal in size and structure.  7. The aortic root and ascending aorta are normal in size and structure.  8. The interatrial septum was not assessed.  ECG  - not ordered   PHYSICAL EXAM  Pleasant middle-aged Caucasian male not in distress. . Afebrile. Head is nontraumatic. Neck is supple without bruit.    Cardiac exam no murmur or gallop. Lungs are clear to auscultation. Distal pulses are well felt. Neurological Exam :  Awake alert oriented to time place and person.  Moderate expressive aphasia with word finding difficulties and paraphasic errors.   Able to speak occasional short sentences.  Good comprehension and follows commands well.  Poor naming and repetition.  Dysarthria present.    Mild right lower facial weakness.  Tongue midline.  Extraocular movements full range without nystagmus.  Blinks to threat bilaterally.  Motor system exam symmetric upper and lower extremity strength no focal weakness.  Fine finger movements are slightly diminished on the right.  Orbits left over right upper extremity.  Sensation appears preserved bilaterally.  Gait not tested.   ASSESSMENT/PLAN [name] is a 63 y.o. male with history of coronary artery disease, ongoing tobacco use, hx of NICM, tonsillar ca in remission, atrial fibrillation on anticoagulation which was held for surgery since last Tuesday - [facility] 0n 03/06/19 - diverticulitis of the large intestine with abscess requiring exploratory laparotomy, low anterior resection rigid proctoscopy, appendectomy. Post op the pt developed speech difficulties. He did not receive IV t-PA due to recent surgery. He was transferred to [facility] where he went to IR with TICI 2b revascularization of the L MCA, w/ attempted revascularization of the prox L ICA.  Interventional Radiology 03/09/2019 S/P bilateral common carotid and RT vertbral arteriograms followed by attempted revascularization of occluded Lt ICA prox . MCA revascularization via collaterals TICI 2b to 2C .  Stroke:  Left MCA infarct embolic d/t known AF off AC for recent surgery s/p IR with attempted revascularization occluded left ICA and partial revascularization L MCA  CT head - Normal appearance of the brain itself. However, there is a definite hyperdense left M2 branch consistent with an acute embolus  CTA H&N - Acute occlusion of the left ICA at its origin. Reconstitution of the ICA by collaterals at the skull base level. Occluded left M2 branch.  CT Perfusion - Acute core infarct involving the anterior left MCA distribution with moderate  surrounding ischemic penumbra   MRI head - L MCA infarct, trace petechial hmg. Decreased L ICA flow void. indeterminate abnormal marrow signal in the clivius. Check view CXR and iof neg a 3-mo f/u noncontrast MRI to re-eval the clivus  2D Echo  - EF 60 - 65%. No cardiac source of emboli identified.   Sars Corona Virus 2 - negative  LDL - 46  HgbA1c - 5.5  VTE prophylaxis - Lovenox  Eliquis (apixaban) daily prior to admission, now on heparin IV. Ok to start oral AC from surgeon's standpoint; not yet cleared to swallow.   Therapy recommendations:  OP PT  Disposition:  Pending  Atrial Fibrillation  Home anticoagulation:  Eliquis (apixaban) daily - on hold for recent surgery  Now on IV heparin  Ok for [facility] per surgeon but unable to swallow  Plan AC once    ICA occlusion  Chronically occluded L ICA  Attempted revascxularization  Hypertension  Stable . On phenylephrine - weaning . BP goal per IR  24h post IR  . BP goal now  SBP 120-140 . Long-term BP goal normotensive  Hyperlipidemia  Lipid lowering medication PTA:  Lipitor 40 mg daily  LDL 46, goal < 70  Current lipid lowering medication:  Lipitor 40 mg daily  Continue statin at discharge  Dysphagia . Secondary to stroke . NPO . Failed MBS . Cortrak placed. For tube feedings . Speech on board   Other Stroke Risk Factors  Advanced age  Cigarette smoker - advised to stop smoking  ETOH use, advised to drink no more than 1 alcoholic beverage per day.  Coronary artery disease  Nonischemic CM  Other Active Problems  Mild Thrombocytopenia - 125  S/P exploratory laparotomy, low anterior resection rigid proctoscopy, appendectomy 03/06/19 - path pending   CBC w/ atypical lymphocytes  Hospital day # 3 Patient has been made slight improvement and has expressive language difficulties but unfortunately is not yet able to swallow safely.  Discussed with patient and his wife at the bedside.  Recommend place  core tract tube for nutrition and medication needs.  Plan to wean off vasopressor support and change systolic blood pressure goal to 1 20-1 40.  Continue IV heparin till patient is able to swallow safely and then will switch to Eliquis.  Discussed with Dr. Michael Boston from general surgery and patient will be transferred to his service. This patient is critically ill and at significant risk of neurological worsening, death and care requires constant monitoring of vital signs, hemodynamics,respiratory and cardiac monitoring, extensive review of multiple databases, frequent neurological assessment, discussion with family, other specialists and medical decision making of high complexity.I have made any additions or clarifications directly to the above note.This critical care time does not reflect procedure time, or teaching time or supervisory time of PA/NP/Med Resident etc but could involve care discussion time.  I spent 30 minutes of neurocritical care time  in the care of  this patient.     Antony Contras, MD Medical Director [place] Pager: [phone] 03/10/2019 12:47 PM   To contact Stroke Continuity provider, please refer to [email or web address] After hours, contact General Neurology

## 2019-03-10 NOTE — Progress Notes (Signed)
Modified Barium Swallow Progress Note  Patient Details  Name: Adrian [name] MRN: [id number] Date of Birth: 1955-11-27  Today's Date: 03/10/2019  Modified Barium Swallow completed.  Full report located under Chart Review in the Imaging Section.  Brief recommendations include the following:  Clinical Impression  Pt has a moderate to severe oropharyngeal dysphagia that is suspected to be acute on chronic given new stroke in addition to prior ACDF and XRT for tonsillar cancer. His oral phase is mildly prolonged, more so as boluses become thicker and mor solid. Bolus cohesion is reduced and spills backward into the pharynx not as a formed bolus, at times using a posterior head tilt to initiate posterior propulsion (making chin tuck a difficult strategy to use). Pharyngeally he exhibits reduced pharyngeal squeeze, base of tongue retraction, and hyolaryngeal movement. Epiglottic inversion and airway closure are reduced. He often penetrates during the swallow as a result. More significantly, he also has frequent aspiration before the swallow with thin liquids (a combined result of oral and pharyngeal impairments) and after the swallow with all consistencies. Moderate residue primarily at the valleculae with thin liquids increases up to more severe residue also at the pyriform sinuses with limited entrance into the UES with purees. Varying volume and bolus presentation did not increase airway protection or efficiency of swallow. Most aspiration goes unsensed, and his cued cough and intermittent spontaneous are only able to partially clear the airway. Recommend that he remain NPO with consideration of temporary, alternative means of nutrition. Could also provided a few single ice chips after oral care to utilize swallowing musculature and facilitate secretion management. Will f/u for exercises to maximize swallow and cough as well as completion of speech-language evaluation.    Swallow Evaluation  Recommendations       SLP Diet Recommendations: NPO;Alternative means - temporary       Medication Administration: Via alternative means               Oral Care Recommendations: Oral care QID   Other Recommendations: Have oral suction available    Venita Sheffield Raijon Lindfors 03/10/2019,10:36 AM   [name], M.A. [place] Acute Environmental education officer [phone] Office [phone]

## 2019-03-10 NOTE — Progress Notes (Signed)
ANTICOAGULATION CONSULT NOTE - Consult  Pharmacy Consult for Heparin Indication: atrial fibrillation and stroke  No Known Allergies  Patient Measurements: Height: 5\' 8"  (172.7 cm) Weight: 140 lb 10.5 oz (63.8 kg) IBW/kg (Calculated) : 68.4 Heparin Dosing Weight: 63.8 kg  Vital Signs: Temp: 98 F (36.7 C) (08/03 1200) Temp Source: Oral (08/03 1200) BP: 167/87 (08/03 1315) Pulse Rate: 69 (08/03 1315)  Labs: Recent Labs    03/08/19 0906 03/09/19 0215 03/09/19 0656  03/09/19 2127 03/10/19 0545 03/10/19 1235  HGB 12.8*  --  12.5*  --   --  10.8*  --   HCT 37.5*  --  36.5*  --   --  31.2*  --   PLT 124*  --  125*  --   --  140*  --   APTT  --  >200*  --    < > 55* 51* 25  LABPROT  --  15.7*  --   --   --   --   --   INR  --  1.3*  --   --   --   --   --   HEPARINUNFRC  --   --   --    < > 0.13* <0.10* <0.10*  CREATININE 0.68  --  0.70  --   --   --   --    < > = values in this interval not displayed.    Estimated Creatinine Clearance: 85.3 mL/min (by C-G formula based on SCr of 0.7 mg/dL).   Medical History: Past Medical History:  Diagnosis Date  . AF (paroxysmal atrial fibrillation) ([place]) 11/29/2018  . Atrial flutter ([place]) 10/12/2015  . CAD (coronary artery disease), native coronary artery 12/06/2015   a. 10/2015 MV: EF  37%, reversible defect inferior apex, intermediate risk findings; b. 10/2015 Cath: 20% mid RCA.  . Colonic diverticular abscess   . Diverticulitis   . History of chemotherapy 2005   Cisplatin  . Hypothyroidism   . NICM (nonischemic cardiomyopathy) ([place]) 10/14/2015   a. Tachy mediated?;  b. Echo 3/17 - Mild concentric LVH, EF 30-35%, anteroseptal, anterior, anterolateral, apical anterior, lateral hypokinesis, trivial MR, mild to moderately reduced RVSF; c. LHC 3/17 - mRCA 20%  . Paroxysmal atrial flutter ([place])    a. TEE 3/17 with ? LAA clot-->s/p TEE/DCCV 11/24/2015  . Radiation NOv.3,2005-Dec. 15, 2005   6810 cGy in 30 fractions  . Tonsil cancer [facility])  2005   Dr Romeo Rabon.  XRT    Assessment: 63 year old male on Eliquis prior to admission for atrial fibrillation. Patient was admitted to [facility] long on 7/31 for robot assisted low anterior resection and appendectomy and Eliquis had been held for surgery - last dose on 7/28. POD#1 patient began to experience stroke symptoms and L-MCA stroke noted on CT. Patient was transferred to [facility] for IR intervention. Attempted mechanical thrombectomy was unsuccessful and carotid artery was not amenable to stent. Pharmacy consulted to dose heparin. CBC and Scr stable.  Update:  HL <0.10  APTT 25 - subtherapeutic   Goal of Therapy:  Heparin level 0.3 to 0.5 units/ml aPTT 66-85 seconds Monitor platelets by anticoagulation protocol: Yes   Plan:  Increase Heparin to 1300 units/hr Check APTT/HL in 6 hours  Monitor for bleeding and mental status changes.    Alanda Slim, PharmD, [facility] Clinical Pharmacist Please see AMION for all Pharmacists' Contact Phone Numbers 03/10/2019, 2:10 PM

## 2019-03-10 NOTE — Progress Notes (Signed)
Initial Nutrition Assessment  DOCUMENTATION CODES:   Not applicable  INTERVENTION:   Recommend initiate  Osmolite 1.2 @ 30 ml/hr and increase by 10 ml every 8 hours to goal rate of 60 ml/hr 30 ml Prostat daily  Provides: 1828 kcal, 94 grams protein, and 1167 ml free water.    NUTRITION DIAGNOSIS:   Inadequate oral intake related to inability to eat as evidenced by NPO status.  GOAL:   Patient will meet greater than or equal to 90% of their needs  MONITOR:   TF tolerance  REASON FOR ASSESSMENT:   Malnutrition Screening Tool    ASSESSMENT:   Pt with PMH of ongoing tobacco use, tonsillar ca in remission, afib on anticoagulation which was held for surgery. Pt admitted to [facility] for diverticulitis of the large intestine with abscess on 7/31 pt s/p ex lap, low anterior resection, rigid proctoscopy, and appendectomy. On 8/1 found to have L ICA stroke s/p unsuccessful IR revascularization.   Pt failed swallow evaluation Cortrak placed today  Medications reviewed and include:  Neo @ 30 - induced hypertension  Labs reviewed    NUTRITION - FOCUSED PHYSICAL EXAM:  Deferred   Diet Order:   Diet Order            Diet NPO time specified Except for: Ice Chips  Diet effective now        Diet - low sodium heart healthy              EDUCATION NEEDS:   No education needs have been identified at this time  Skin:  Skin Assessment: Reviewed RN Assessment  Last BM:  7/30  Height:   Ht Readings from Last 1 Encounters:  03/07/19 5\' 8"  (1.727 m)    Weight:   Wt Readings from Last 1 Encounters:  03/09/19 63.8 kg    Ideal Body Weight:  70 kg  BMI:  Body mass index is 21.39 kg/m.  Estimated Nutritional Needs:   Kcal:  1800-2000  Protein:  90-110 grams  Fluid:  > 2L/day   [place], [place], [place] Pager [phone] After Hours Pager

## 2019-03-11 ENCOUNTER — Encounter (HOSPITAL_COMMUNITY): Payer: Self-pay | Admitting: Interventional Radiology

## 2019-03-11 ENCOUNTER — Other Ambulatory Visit: Payer: Self-pay | Admitting: Family Medicine

## 2019-03-11 LAB — CBC
HCT: 27.5 % — ABNORMAL LOW (ref 39.0–52.0)
Hemoglobin: 9.5 g/dL — ABNORMAL LOW (ref 13.0–17.0)
MCH: 30.6 pg (ref 26.0–34.0)
MCHC: 34.5 g/dL (ref 30.0–36.0)
MCV: 88.7 fL (ref 80.0–100.0)
Platelets: 146 10*3/uL — ABNORMAL LOW (ref 150–400)
RBC: 3.1 MIL/uL — ABNORMAL LOW (ref 4.22–5.81)
RDW: 15.7 % — ABNORMAL HIGH (ref 11.5–15.5)
WBC: 4.6 10*3/uL (ref 4.0–10.5)
nRBC: 0 % (ref 0.0–0.2)

## 2019-03-11 LAB — HEPARIN LEVEL (UNFRACTIONATED): Heparin Unfractionated: 0.31 IU/mL (ref 0.30–0.70)

## 2019-03-11 LAB — APTT: aPTT: >200 seconds (ref 24–36)

## 2019-03-11 MED ORDER — DIPHENHYDRAMINE HCL 12.5 MG/5ML PO ELIX: 12.5000 mg | ORAL_SOLUTION | Freq: Four times a day (QID) | ORAL | Status: DC | PRN

## 2019-03-11 MED ORDER — ALUM & MAG HYDROXIDE-SIMETH 200-200-20 MG/5ML PO SUSP: 30.0000 mL | Freq: Four times a day (QID) | ORAL | Status: DC | PRN

## 2019-03-11 MED ORDER — PRO-STAT SUGAR FREE PO LIQD
30.0000 mL | Freq: Every day | ORAL | Status: DC
  Administered 2019-03-11 – 2019-03-14 (×3): 30 mL
  Filled 2019-03-11 (×4): qty 30

## 2019-03-11 MED ORDER — DIPHENHYDRAMINE HCL 50 MG/ML IJ SOLN: 12.5000 mg | Freq: Four times a day (QID) | INTRAMUSCULAR | Status: DC | PRN

## 2019-03-11 NOTE — Progress Notes (Signed)
  Speech Language Pathology Treatment: Dysphagia  Patient Details Name: Adrian [name] MRN: [id number] DOB: 08-Jan-1956 Today's Date: 03/11/2019 Time: 2671-2458 SLP Time Calculation (min) (ACUTE ONLY): 15 min  Assessment / Plan / Recommendation Clinical Impression  Dysphagia treatment provided following speech-language assessment targeting pharyngeal contraction, laryngeal elevation and tongue base ROM. Introduced strengthening interventions including effortful swallow (pharyngeal contraction, tongue base retraction), Masako technique (tongue base) and pitch modulation (laryngeal elevation). Oral groping noted with Masako given motor planning challenges. Prior to exercises mouth was cleaned and ice chips administered for moisture and use of more natural swallow pattern versus saliva only. Pharyngeal congestion present with strong cough to temporarily clear mucous. Prognosis for swallow rehab is good but impacted by prior ACDF and radiation treatment to tonsils. Continue to allow ice chips after mouth care.    HPI HPI: Adrian [name] is a 63 y.o. male coronary artery disease, tonsillar ca in remission, atrial fibrillation on anticoagulation which was held for surgery since last Tuesday, transferred emergently from [facility] long hospital where he was admitted for diverticulitis of the large intestine with abscess requiring exploratory laparotomy, low anterior resection rigid proctoscopy, appendectomy, last seen normal at 7:30 PM on 03/08/2019 at [facility] when it was noticed that he had difficulty with speech- speech did not make sense. CTA head and neck was also done which revealed a left cervical carotid occlusion as well as left MCA occlusion. S/P bilateral common carotid and RT vertbral arteriograms followed by attempted revascularization of occluded Lt ICA prox .MCA revascularization via collaterals TICI 2b to 2C . MRI shows Restricted diffusion in the left MCA middle to anterior division territory  involving most of the insula, left operculum      SLP Plan  Continue with current plan of care       Recommendations  Diet recommendations: NPO Medication Administration: Via alternative means                Oral Care Recommendations: Oral care QID Follow up Recommendations: Inpatient Rehab SLP Visit Diagnosis: Dysphagia, unspecified (R13.10) Plan: Continue with current plan of care       GO                Houston Siren 03/11/2019, 8:46 PM

## 2019-03-11 NOTE — Progress Notes (Signed)
Nutrition Follow-up  DOCUMENTATION CODES:   Not applicable  INTERVENTION:   Initiate Osmolite 1.2 @ 30 ml/hr and increase by 10 ml every 8 hours to goal rate of 60 ml/hr 30 ml Prostat daily  Provides: 1828 kcal, 94 grams protein, and 1167 ml free water.    NUTRITION DIAGNOSIS:   Inadequate oral intake related to inability to eat as evidenced by NPO status. Ongoing  GOAL:   Patient will meet greater than or equal to 90% of their needs Progressing  MONITOR:   TF tolerance  REASON FOR ASSESSMENT:   Consult Enteral/tube feeding initiation and management  ASSESSMENT:   Pt with PMH of ongoing tobacco use, tonsillar ca in remission, afib on anticoagulation which was held for surgery. Pt admitted to [facility] for diverticulitis of the large intestine with abscess on 7/31 pt s/p ex lap, low anterior resection, rigid proctoscopy, and appendectomy. On 8/1 found to have L ICA stroke s/p unsuccessful IR revascularization.   8/3 Pt failed swallow evaluation; Cortrak placed. TF started   Medications reviewed and include:  Neo @ 40 - induced hypertension  Labs reviewed    NUTRITION - FOCUSED PHYSICAL EXAM:  Deferred   Diet Order:   Diet Order            Diet NPO time specified Except for: Ice Chips  Diet effective now        Diet - low sodium heart healthy              EDUCATION NEEDS:   No education needs have been identified at this time  Skin:  Skin Assessment: Reviewed RN Assessment  Last BM:  7/30  Height:   Ht Readings from Last 1 Encounters:  03/07/19 5\' 8"  (1.727 m)    Weight:   Wt Readings from Last 1 Encounters:  03/11/19 61.5 kg    Ideal Body Weight:  70 kg  BMI:  Body mass index is 20.62 kg/m.  Estimated Nutritional Needs:   Kcal:  1800-2000  Protein:  90-110 grams  Fluid:  > 2L/day   [place], [place], [place] Pager [phone] After Hours Pager

## 2019-03-11 NOTE — Progress Notes (Signed)
Referring Physician(s): Code Stroke- Rory Percy, Ashish  Supervising Physician: Luanne Bras  Patient Status:  [facility] - In-pt  Chief Complaint: "When can I go home?"  Subjective:  Acute CVA (left MCA infarct) s/p diagnostic cerebral arteriogram with attempted revascularization of occluded proximal left ICA achieving a TICI 2b to 2c revascularization 03/09/2019 by Dr. Estanislado Pandy. Patient awake and alert sitting in chair with no complaints at this time. Asking when he can go home. Can spontaneously move all extremities. Speech dysarthric. Mild right facial droop. Right groin (access site) and left groin (attempted access site) incisions c/d/i.   Allergies: Patient has no known allergies.  Medications: Prior to Admission medications   Medication Sig Start Date End Date Taking? Authorizing Provider  apixaban (ELIQUIS) 5 MG TABS tablet Take 1 tablet (5 mg total) by mouth 2 (two) times daily. Patient not taking: Reported on 03/05/2019 02/08/18  Yes Turner, Eber Hong, MD  atorvastatin (LIPITOR) 40 MG tablet Take 1 tablet (40 mg total) by mouth daily. Please keep upcoming appt in September for future refills. Thank you Patient taking differently: Take 40 mg by mouth 3 (three) times a week.  03/01/18  Yes Imogene Burn, PA-C  nebivolol (BYSTOLIC) 10 MG tablet Take 1 tablet (10 mg total) by mouth daily. 05/21/18  Yes Turner, Eber Hong, MD  acetaminophen (TYLENOL) 325 MG tablet Take 2 tablets (650 mg total) by mouth every 6 (six) hours as needed for mild pain (or Fever >/= 101). Patient not taking: Reported on 03/06/2019 12/04/18   Eugenie Filler, MD  levothyroxine (SYNTHROID) 125 MCG tablet TAKE 1 TABLET BY MOUTH EVERY DAY 03/11/19   Burchette, Alinda Sierras, MD  Multiple Vitamin (MULTIVITAMIN WITH MINERALS) TABS tablet Take 1 tablet by mouth daily. Patient not taking: Reported on 02/27/2019 12/05/18   Eugenie Filler, MD  traMADol (ULTRAM) 50 MG tablet Take 1-2 tablets (50-100 mg total) by mouth  every 6 (six) hours as needed for moderate pain or severe pain. 03/07/19   Michael Boston, MD     Vital Signs: BP 120/70    Pulse 60    Temp 98.1 F (36.7 C) (Oral)    Resp 16    Ht 5\' 8"  (1.727 m)    Wt 135 lb 9.3 oz (61.5 kg)    SpO2 99%    BMI 20.62 kg/m   Physical Exam Vitals signs and nursing note reviewed.  Constitutional:      General: He is not in acute distress.    Appearance: Normal appearance.  Pulmonary:     Effort: Pulmonary effort is normal. No respiratory distress.  Skin:    General: Skin is warm and dry.     Comments: Right groin (access site) and left groin (attempted access site) incisions both soft without active bleeding or hematoma.  Neurological:     Mental Status: He is alert.     Comments: Alert, awake, and oriented x3. Speech dysarthric, comprehension intact. PERRL bilaterally. Mild right facial droop. Tongue midline. Can spontaneously move all extremities. Distal pulses 1+ bilaterally.  Psychiatric:        Mood and Affect: Mood normal.        Behavior: Behavior normal.        Thought Content: Thought content normal.        Judgment: Judgment normal.     Imaging: Ct Angio Head W Or Wo Contrast  Result Date: 03/08/2019 CLINICAL DATA:  Expressive aphasia.  Left hyperdense MCA at head CT. EXAM: CT ANGIOGRAPHY  HEAD AND NECK TECHNIQUE: Multidetector CT imaging of the head and neck was performed using the standard protocol during bolus administration of intravenous contrast. Multiplanar CT image reconstructions and MIPs were obtained to evaluate the vascular anatomy. Carotid stenosis measurements (when applicable) are obtained utilizing NASCET criteria, using the distal internal carotid diameter as the denominator. CONTRAST:  165mL OMNIPAQUE IOHEXOL 350 MG/ML SOLN COMPARISON:  Head CT earlier same day. FINDINGS: CTA NECK FINDINGS Aortic arch: Mild aortic atherosclerosis. No aneurysm or dissection. Left vertebral artery arises directly from the arch. Right  carotid system: Common carotid artery widely patent to the bifurcation. Mild atherosclerotic disease at carotid bifurcation but no stenosis or irregularity. Cervical ICA widely patent. Left carotid system: Common carotid artery is widely patent to the bifurcation. Advanced atherosclerotic disease at the carotid bifurcation with soft and calcified plaque and occlusion of the ICA at the bulb. No reconstituted flow in the cervical region. Small amount of reconstituted flow in the carotid canal. Vertebral arteries: Both vertebral artery origins are widely patent. Left vertebral artery arises from the arch as noted above. Both vertebral arteries widely patent through the cervical region. Skeleton: Cervical spondylosis.  Previous ACDF C5-6. Other neck: No mass or adenopathy. Upper chest: Pleural and parenchymal scarring at both lung apices. Review of the MIP images confirms the above findings CTA HEAD FINDINGS Anterior circulation: Right internal carotid artery is widely patent through the skull base and siphon region. Mild siphon atherosclerotic calcification. Right anterior and middle cerebral arteries are widely patent. Left internal carotid artery shows mild reconstituted flow in the carotid canal and flow in the siphon region. Siphon atherosclerotic calcification. Left anterior cerebral artery is widely patent. Left M1 segment is widely patent. Occluded left M2 branch as suggested by noncontrast CT. Posterior circulation: Both vertebral arteries are widely patent to the basilar. No basilar stenosis. Posterior circulation branch vessels are patent. Venous sinuses: Patent and normal. Anatomic variants: None significant. Review of the MIP images confirms the above findings IMPRESSION: Acute occlusion of the left ICA at its origin. Reconstitution of the ICA by collaterals at the skull base level. Occluded left M2 branch. These results were called by telephone at the time of interpretation on 03/08/2019 at 9:55 pm to Dr.  Armandina Gemma , who verbally acknowledged these results. Electronically Signed   By: Nelson Chimes M.D.   On: 03/08/2019 22:00   Ct Angio Neck W Or Wo Contrast  Result Date: 03/08/2019 CLINICAL DATA:  Expressive aphasia.  Left hyperdense MCA at head CT. EXAM: CT ANGIOGRAPHY HEAD AND NECK TECHNIQUE: Multidetector CT imaging of the head and neck was performed using the standard protocol during bolus administration of intravenous contrast. Multiplanar CT image reconstructions and MIPs were obtained to evaluate the vascular anatomy. Carotid stenosis measurements (when applicable) are obtained utilizing NASCET criteria, using the distal internal carotid diameter as the denominator. CONTRAST:  113mL OMNIPAQUE IOHEXOL 350 MG/ML SOLN COMPARISON:  Head CT earlier same day. FINDINGS: CTA NECK FINDINGS Aortic arch: Mild aortic atherosclerosis. No aneurysm or dissection. Left vertebral artery arises directly from the arch. Right carotid system: Common carotid artery widely patent to the bifurcation. Mild atherosclerotic disease at carotid bifurcation but no stenosis or irregularity. Cervical ICA widely patent. Left carotid system: Common carotid artery is widely patent to the bifurcation. Advanced atherosclerotic disease at the carotid bifurcation with soft and calcified plaque and occlusion of the ICA at the bulb. No reconstituted flow in the cervical region. Small amount of reconstituted flow in the carotid canal.  Vertebral arteries: Both vertebral artery origins are widely patent. Left vertebral artery arises from the arch as noted above. Both vertebral arteries widely patent through the cervical region. Skeleton: Cervical spondylosis.  Previous ACDF C5-6. Other neck: No mass or adenopathy. Upper chest: Pleural and parenchymal scarring at both lung apices. Review of the MIP images confirms the above findings CTA HEAD FINDINGS Anterior circulation: Right internal carotid artery is widely patent through the skull base and  siphon region. Mild siphon atherosclerotic calcification. Right anterior and middle cerebral arteries are widely patent. Left internal carotid artery shows mild reconstituted flow in the carotid canal and flow in the siphon region. Siphon atherosclerotic calcification. Left anterior cerebral artery is widely patent. Left M1 segment is widely patent. Occluded left M2 branch as suggested by noncontrast CT. Posterior circulation: Both vertebral arteries are widely patent to the basilar. No basilar stenosis. Posterior circulation branch vessels are patent. Venous sinuses: Patent and normal. Anatomic variants: None significant. Review of the MIP images confirms the above findings IMPRESSION: Acute occlusion of the left ICA at its origin. Reconstitution of the ICA by collaterals at the skull base level. Occluded left M2 branch. These results were called by telephone at the time of interpretation on 03/08/2019 at 9:55 pm to Dr. Armandina Gemma , who verbally acknowledged these results. Electronically Signed   By: Nelson Chimes M.D.   On: 03/08/2019 22:00   Mr Brain Wo Contrast  Result Date: 03/09/2019 CLINICAL DATA:  63 year old male emergent left ICA and left M2 Large Vessel occlusion status post endovascular treatment. EXAM: MRI HEAD WITHOUT CONTRAST TECHNIQUE: Multiplanar, multiecho pulse sequences of the brain and surrounding structures were obtained without intravenous contrast. COMPARISON:  CT head, CTA head and neck and CT perfusion 03/08/2019. FINDINGS: Brain: Restricted diffusion in the left MCA middle to anterior division territory involving most of the insula, left operculum cortex, cortex of the left inferior frontal gyrus and tracking cephalad to the left middle frontal gyrus (series 7, image 65). This is similar to the estimated core infarct distribution on CTP, perhaps slightly increased in extent. Associated cytotoxic edema with T2 and FLAIR hyperintensity. Superimposed small area of superior left parietal lobe  cortical encephalomalacia on series 9, image 21. Trace petechial hemorrhage at the left insula on series 12, image 26. No contralateral right hemisphere or posterior fossa restricted diffusion. No midline shift, mass effect, evidence of mass lesion, ventriculomegaly, extra-axial collection or acute intracranial hemorrhage. Cervicomedullary junction and pituitary are within normal limits. Vascular: Major intracranial vascular flow voids are preserved, although the left ICA petrous segment flow void is diminished on series 15, image 6. There is some asymmetric left MCA branch FLAIR signal (series 9, image 13). Skull and upper cervical spine: Chronic disc and endplate degeneration at C3-C4 with mild spinal stenosis at that level. There is heterogeneous marrow signal in the clivus on series 14, image 12 which is also conspicuous on diffusion-weighted imaging., but elsewhere visible bone marrow signal is within normal limits. This area appeared normal by CT. Sinuses/Orbits: Negative orbits. Right frontal and frontoethmoidal sinus disease redemonstrated. Other: Mastoids are clear. Visible internal auditory structures appear normal. Scalp and face soft tissues appear negative. IMPRESSION: 1. Acute Left MCA infarct involving the middle/anterior division territory similar to the CTP core estimate. Trace petechial hemorrhage (Heidelberg Classification 1a). No mass effect. 2. Asymmetrically decreased Left ICA petrous segment flow void, but otherwise preserved major vascular flow voids. 3. Indeterminate abnormal marrow signal in the clivus, which had an unremarkable CT appearance  yesterday. Recommend follow-up Two-view Chest Radiographs when possible, and if negative a 3 month follow-up noncontrast Brain MRI to re-evaluate the clivus. Electronically Signed   By: Genevie Ann M.D.   On: 03/09/2019 13:21   Ct Cerebral Perfusion W Contrast  Result Date: 03/08/2019 CLINICAL DATA:  Initial evaluation for acute stroke, known left ICA  and left M2 occlusion as seen on prior CTA. EXAM: CT PERFUSION BRAIN TECHNIQUE: Multiphase CT imaging of the brain was performed following IV bolus contrast injection. Subsequent parametric perfusion maps were calculated using RAPID software. CONTRAST:  67mL OMNIPAQUE IOHEXOL 350 MG/ML SOLN COMPARISON:  Prior CTA from earlier same day. FINDINGS: CT Brain Perfusion Findings: CBF (<30%) Volume: 74mL Perfusion (Tmax>6.0s) volume: 158mL Mismatch Volume: 74mL ASPECTS on noncontrast CT Head: 10 at  21:10 today. Infarct Core: 20 mL Infarction Location:Acute core infarct seen involving the anterior left frontal lobe, left MCA distribution. Moderate surrounding ischemic penumbra. IMPRESSION: Acute core infarct involving the anterior left MCA distribution with moderate surrounding ischemic penumbra as above. Electronically Signed   By: Jeannine Boga M.D.   On: 03/08/2019 23:56   Dg Swallowing Func-speech Pathology  Result Date: 03/10/2019 Objective Swallowing Evaluation: Type of Study: MBS-Modified Barium Swallow Study  Patient Details Name: Adrian [name] MRN: [id number] Date of Birth: February 18, 1956 Today's Date: 03/10/2019 Time: SLP Start Time (ACUTE ONLY): 0907 -SLP Stop Time (ACUTE ONLY): 0926 SLP Time Calculation (min) (ACUTE ONLY): 19 min Past Medical History: Past Medical History: Diagnosis Date  AF (paroxysmal atrial fibrillation) ([place]) 11/29/2018  Atrial flutter ([place]) 10/12/2015  CAD (coronary artery disease), native coronary artery 12/06/2015  a. 10/2015 MV: EF  37%, reversible defect inferior apex, intermediate risk findings; b. 10/2015 Cath: 20% mid RCA.  Colonic diverticular abscess   Diverticulitis   History of chemotherapy 2005  Cisplatin  Hypothyroidism   NICM (nonischemic cardiomyopathy) ([place]) 10/14/2015  a. Tachy mediated?;  b. Echo 3/17 - Mild concentric LVH, EF 30-35%, anteroseptal, anterior, anterolateral, apical anterior, lateral hypokinesis, trivial MR, mild to moderately reduced RVSF; c. LHC 3/17 -  mRCA 20%  Paroxysmal atrial flutter ([place])   a. TEE 3/17 with ? LAA clot-->s/p TEE/DCCV 11/24/2015  Radiation NOv.3,2005-Dec. 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  XRT Past Surgical History: Past Surgical History: Procedure Laterality Date  APPENDECTOMY N/A 03/07/2019  Procedure: ROBOTIC ASSISTED APPENDECTOMY;  Surgeon: Michael Boston, MD;  Location: [facility];  Service: General;  Laterality: N/A;  CARDIAC CATHETERIZATION N/A 10/18/2015  Procedure: Left Heart Cath and Coronary Angiography;  Surgeon: Burnell Blanks, MD;  Location: [place] [facility];  Service: Cardiovascular;  Laterality: N/A;  CARDIOVERSION N/A 11/24/2015  Procedure: CARDIOVERSION;  Surgeon: Josue Hector, MD;  Location: [place];  Service: Cardiovascular;  Laterality: N/A;  CYSTOSCOPY WITH STENT PLACEMENT Bilateral 03/07/2019  Procedure: CYSTOSCOPY WITH BILATERAL FIREFLY INJECTION;  Surgeon: Ardis Hughs, MD;  Location: [facility];  Service: Urology;  Laterality: Bilateral;  GASTROSTOMY TUBE PLACEMENT  07/04/2004  IR - G tube for tonsilar cancer  IR RADIOLOGIST EVAL & MGMT  12/18/2018  LAMINECTOMY    C5/placement of steel plate  NECK SURGERY  2003  replaced disk  TEE WITHOUT CARDIOVERSION N/A 10/15/2015  Procedure: TRANSESOPHAGEAL ECHOCARDIOGRAM (TEE);  Surgeon: Larey Dresser, MD;  Location: [place];  Service: Cardiovascular;  Laterality: N/A;  TEE WITHOUT CARDIOVERSION N/A 11/24/2015  Procedure: TRANSESOPHAGEAL ECHOCARDIOGRAM (TEE);  Surgeon: Josue Hector, MD;  Location: [place];  Service: Cardiovascular;  Laterality: N/A;  XI ROBOTIC ASSISTED COLOSTOMY TAKEDOWN N/A 03/07/2019  Procedure: XI ROBOTIC ASSISTED LOW ANTERIOR RESECTION, RIGID PROCTOSCOPY;  Surgeon: Michael Boston, MD;  Location: [facility];  Service: General;  Laterality: N/A; HPI: CHOZEN LATULIPPE is a 63 y.o. male coronary artery disease, tonsillar ca in remission, atrial fibrillation on anticoagulation which was held for  surgery since last Tuesday, transferred emergently from [facility] long hospital where he was admitted for diverticulitis of the large intestine with abscess requiring exploratory laparotomy, low anterior resection rigid proctoscopy, appendectomy, last seen normal at 7:30 PM on 03/08/2019 at [facility] when it was noticed that he had difficulty with speech- speech did not make sense. CTA head and neck was also done which revealed a left cervical carotid occlusion as well as left MCA occlusion. S/P bilateral common carotid and RT vertbral arteriograms followed by attempted revascularization of occluded Lt ICA prox .MCA revascularization via collaterals TICI 2b to 2C . MRI shows Restricted diffusion in the left MCA middle to anterior division territory involving most of the insula, left operculum  Subjective: pt alert, aphasic Assessment / Plan / Recommendation CHL IP CLINICAL [name] 03/10/2019 Clinical Impression Pt has a moderate to severe oropharyngeal dysphagia that is suspected to be acute on chronic given new stroke in addition to prior ACDF and XRT for tonsillar cancer. His oral phase is mildly prolonged, more so as boluses become thicker and mor solid. Bolus cohesion is reduced and spills backward into the pharynx not as a formed bolus, at times using a posterior head tilt to initiate posterior propulsion (making chin tuck a difficult strategy to use). Pharyngeally he exhibits reduced pharyngeal squeeze, base of tongue retraction, and hyolaryngeal movement. Epiglottic inversion and airway closure are reduced. He often penetrates during the swallow as a result. More significantly, he also has frequent aspiration before the swallow with thin liquids (a combined result of oral and pharyngeal impairments) and after the swallow with all consistencies. Moderate residue primarily at the valleculae with thin liquids increases up to more severe residue also at the pyriform sinuses with limited entrance into the  UES with purees. Varying volume and bolus presentation did not increase airway protection or efficiency of swallow. Most aspiration goes unsensed, and his cued cough and intermittent spontaneous are only able to partially clear the airway. Recommend that he remain NPO with consideration of temporary, alternative means of nutrition. Could also provided a few single ice chips after oral care to utilize swallowing musculature and facilitate secretion management. Will f/u for exercises to maximize swallow and cough as well as completion of speech-language evaluation.  SLP Visit Diagnosis Dysphagia, oropharyngeal phase (R13.12) Attention and concentration deficit following -- Frontal lobe and executive function deficit following -- Impact on safety and function Severe aspiration risk   CHL IP TREATMENT RECOMMENDATION 03/10/2019 Treatment Recommendations Therapy as outlined in treatment plan below   Prognosis 03/10/2019 Prognosis for Safe Diet Advancement Good Barriers to Reach Goals Severity of deficits Barriers/Prognosis Comment -- CHL IP DIET RECOMMENDATION 03/10/2019 SLP Diet Recommendations NPO;Alternative means - temporary Liquid Administration via -- Medication Administration Via alternative means Compensations -- Postural Changes --   CHL IP OTHER RECOMMENDATIONS 03/10/2019 Recommended Consults -- Oral Care Recommendations Oral care QID Other Recommendations Have oral suction available   CHL IP FOLLOW UP RECOMMENDATIONS 03/10/2019 Follow up Recommendations Inpatient Rehab   CHL IP FREQUENCY AND DURATION 03/10/2019 Speech Therapy Frequency (ACUTE ONLY) min 2x/week Treatment Duration 2 weeks      CHL IP ORAL  PHASE 03/10/2019 Oral Phase Impaired Oral - Pudding Teaspoon -- Oral - Pudding Cup -- Oral - Honey Teaspoon -- Oral - Honey Cup Reduced posterior propulsion;Decreased bolus cohesion Oral - Nectar Teaspoon Reduced posterior propulsion;Decreased bolus cohesion Oral - Nectar Cup Reduced posterior propulsion;Decreased bolus  cohesion Oral - Nectar Straw -- Oral - Thin Teaspoon Reduced posterior propulsion;Decreased bolus cohesion Oral - Thin Cup Reduced posterior propulsion;Decreased bolus cohesion Oral - Thin Straw -- Oral - Puree Reduced posterior propulsion;Delayed oral transit;Decreased bolus cohesion Oral - Mech Soft -- Oral - Regular -- Oral - Multi-Consistency -- Oral - Pill -- Oral Phase - Comment --  CHL IP PHARYNGEAL PHASE 03/10/2019 Pharyngeal Phase -- Pharyngeal- Pudding Teaspoon -- Pharyngeal -- Pharyngeal- Pudding Cup -- Pharyngeal -- Pharyngeal- Honey Teaspoon -- Pharyngeal -- Pharyngeal- Honey Cup Reduced pharyngeal peristalsis;Reduced epiglottic inversion;Reduced anterior laryngeal mobility;Reduced laryngeal elevation;Reduced airway/laryngeal closure;Reduced tongue base retraction;Pharyngeal residue - valleculae;Pharyngeal residue - pyriform;Penetration/Apiration after swallow Pharyngeal Material enters airway, passes BELOW cords without attempt by patient to eject out (silent aspiration) Pharyngeal- Nectar Teaspoon Reduced pharyngeal peristalsis;Reduced epiglottic inversion;Reduced anterior laryngeal mobility;Reduced laryngeal elevation;Reduced airway/laryngeal closure;Reduced tongue base retraction;Pharyngeal residue - valleculae;Penetration/Apiration after swallow;Pharyngeal residue - pyriform Pharyngeal Material enters airway, passes BELOW cords without attempt by patient to eject out (silent aspiration) Pharyngeal- Nectar Cup Reduced pharyngeal peristalsis;Reduced epiglottic inversion;Reduced anterior laryngeal mobility;Reduced laryngeal elevation;Reduced airway/laryngeal closure;Reduced tongue base retraction;Pharyngeal residue - valleculae;Penetration/Apiration after swallow;Pharyngeal residue - pyriform Pharyngeal Material enters airway, passes BELOW cords without attempt by patient to eject out (silent aspiration) Pharyngeal- Nectar Straw -- Pharyngeal -- Pharyngeal- Thin Teaspoon Reduced pharyngeal  peristalsis;Reduced epiglottic inversion;Reduced anterior laryngeal mobility;Reduced laryngeal elevation;Reduced airway/laryngeal closure;Reduced tongue base retraction;Pharyngeal residue - valleculae Pharyngeal -- Pharyngeal- Thin Cup Reduced pharyngeal peristalsis;Reduced epiglottic inversion;Reduced anterior laryngeal mobility;Reduced laryngeal elevation;Reduced airway/laryngeal closure;Reduced tongue base retraction;Pharyngeal residue - valleculae;Penetration/Aspiration before swallow;Penetration/Apiration after swallow;Pharyngeal residue - pyriform Pharyngeal Material enters airway, passes BELOW cords without attempt by patient to eject out (silent aspiration);Material enters airway, passes BELOW cords and not ejected out despite cough attempt by patient Pharyngeal- Thin Straw -- Pharyngeal -- Pharyngeal- Puree Reduced pharyngeal peristalsis;Reduced epiglottic inversion;Reduced anterior laryngeal mobility;Reduced laryngeal elevation;Reduced airway/laryngeal closure;Reduced tongue base retraction;Pharyngeal residue - valleculae;Pharyngeal residue - pyriform Pharyngeal -- Pharyngeal- Mechanical Soft -- Pharyngeal -- Pharyngeal- Regular -- Pharyngeal -- Pharyngeal- Multi-consistency -- Pharyngeal -- Pharyngeal- Pill -- Pharyngeal -- Pharyngeal Comment --  CHL IP CERVICAL ESOPHAGEAL PHASE 03/10/2019 Cervical Esophageal Phase Impaired Pudding Teaspoon -- Pudding Cup -- Honey Teaspoon -- Honey Cup Reduced cricopharyngeal relaxation Nectar Teaspoon Reduced cricopharyngeal relaxation Nectar Cup Reduced cricopharyngeal relaxation Nectar Straw -- Thin Teaspoon Reduced cricopharyngeal relaxation Thin Cup Reduced cricopharyngeal relaxation Thin Straw -- Puree Reduced cricopharyngeal relaxation Mechanical Soft -- Regular -- Multi-consistency -- Pill -- Cervical Esophageal Comment -- Venita Sheffield Nix 03/10/2019, 10:37 AM  [name], M.A. [facility] Pager [phone] Office [phone]             Ct  Head Code Stroke Wo Contrast  Result Date: 03/08/2019 CLINICAL DATA:  Code stroke.  Expressive aphasia.  Slurred speech. EXAM: CT HEAD WITHOUT CONTRAST TECHNIQUE: Contiguous axial images were obtained from the base of the skull through the vertex without intravenous contrast. COMPARISON:  None. FINDINGS: Brain: No evidence of old or acute infarction, mass lesion, hemorrhage, hydrocephalus or extra-axial collection. Vascular: Hyperdense left MCA branch, probably M2. Skull: Normal Sinuses/Orbits: Right frontal sinus opacification.  Orbits negative. Other: None ASPECTS ([place] Stroke Program Early CT Score) - Ganglionic level infarction (caudate, lentiform nuclei, internal  capsule, insula, M1-M3 cortex): 7 - Supraganglionic infarction (M4-M6 cortex): 3 Total score (0-10 with 10 being normal): 10 IMPRESSION: 1. Normal appearance of the brain itself. However, there is a definite hyperdense left M2 branch consistent with an acute embolus. Right frontal sinusitis. 2. ASPECTS is 10. 3. These results were called by telephone at the time of interpretation on 03/08/2019 at 9:26 pm to Dr. Harlow Asa, who verbally acknowledged these results. Electronically Signed   By: Nelson Chimes M.D.   On: 03/08/2019 21:31    Labs:  CBC: Recent Labs    03/08/19 0906 03/09/19 0656 03/10/19 0545 03/11/19 0809  WBC 3.1* 5.0 4.7 4.6  HGB 12.8* 12.5* 10.8* 9.5*  HCT 37.5* 36.5* 31.2* 27.5*  PLT 124* 125* 140* 146*    COAGS: Recent Labs    11/29/18 2109 03/09/19 0215  03/09/19 2127 03/10/19 0545 03/10/19 1235 03/10/19 2034  INR 1.1 1.3*  --   --   --   --   --   APTT  --  >200*   < > 55* 51* 25 88*   < > = values in this interval not displayed.    BMP: Recent Labs    12/04/18 0323 03/05/19 0944 03/08/19 0906 03/09/19 0656  NA 136 137 136 139  K 4.1 4.9 4.1 3.8  CL 103 101 102 104  CO2 26 28 27 25   GLUCOSE 102* 110* 135* 98  BUN 16 14 10  7*  CALCIUM 8.3* 9.0 8.5* 8.4*  CREATININE 0.68 0.70 0.68 0.70    GFRNONAA >60 >60 >60 >60  GFRAA >60 >60 >60 >60    LIVER FUNCTION TESTS: Recent Labs    09/23/18 0948 09/24/18 0153 11/29/18 1647  BILITOT 0.7 1.0 0.6  AST 18 14* 13*  ALT 12 10 13   ALKPHOS 61 51 73  PROT 7.3 5.9* 7.5  ALBUMIN 3.3* 2.5* 3.0*    Assessment and Plan:  Acute CVA (left MCA infarct) s/p diagnostic cerebral arteriogram with attempted revascularization of occluded proximal left ICA achieving a TICI 2b to 2c revascularization 03/09/2019 by Dr. Estanislado Pandy. Patient's condition stable- can spontaneously move all extremities, still with dysarthria and mild right facial droop. Right groin (access site) and left groin (attempted access site) incisions stable- distal pulses intact. Further plans per CCS/neurology- appreciate and agree with management. Please call NIR with questions/concerns.   Electronically Signed: Earley Abide, PA-C 03/11/2019, 9:26 AM   I spent a total of 25 Minutes at the the patient's bedside AND on the patient's hospital floor or unit, greater than 50% of which was counseling/coordinating care for acute CVA.

## 2019-03-11 NOTE — Progress Notes (Signed)
Speech-Language-Cognitive evaluation    03/11/19 1451  SLP Visit Information  SLP Received On 03/11/19  SLP Time Calculation  SLP Start Time (ACUTE ONLY) 1452  SLP Stop Time (ACUTE ONLY) 1517  SLP Time Calculation (min) (ACUTE ONLY) 25 min  General Information  HPI Adrian [name] is a 63 y.o. male coronary artery disease, tonsillar ca in remission, atrial fibrillation on anticoagulation which was held for surgery since last Tuesday, transferred emergently from [facility] long hospital where he was admitted for diverticulitis of the large intestine with abscess requiring exploratory laparotomy, low anterior resection rigid proctoscopy, appendectomy, last seen normal at 7:30 PM on 03/08/2019 at [facility] when it was noticed that he had difficulty with speech- speech did not make sense. CTA head and neck was also done which revealed a left cervical carotid occlusion as well as left MCA occlusion. S/P bilateral common carotid and RT vertbral arteriograms followed by attempted revascularization of occluded Lt ICA prox .MCA revascularization via collaterals TICI 2b to 2C . MRI shows Restricted diffusion in the left MCA middle to anterior division territory involving most of the insula, left operculum  Prior Functional Status  Cognitive/Linguistic Baseline WFL  Type of Home House   Lives With Spouse  Education 2 yrs college  Vocation Full time employment ([employer])  Pain Assessment  Pain Assessment No/denies pain  Oral Motor/Sensory Function  Overall Oral Motor/Sensory Function Moderate impairment  Facial ROM Reduced right;Suspected CN VII (facial) dysfunction  Facial Symmetry Abnormal symmetry right;Suspected CN VII (facial) dysfunction  Facial Strength Reduced right;Suspected CN VII (facial) dysfunction  Lingual ROM Reduced right;Suspected CN XII (hypoglossal) dysfunction  Lingual Symmetry Abnormal symmetry right;Suspected CN XII (hypoglossal) dysfunction  Lingual Strength WFL  Mandible WFL   Cognition  Overall Cognitive Status Impaired/Different from baseline  Arousal/Alertness Awake/alert  Orientation Level Oriented to person;Oriented to place;Oriented to situation;Oriented to time  Attention Sustained  Sustained Attention Appears intact  Memory  (will further assess)  Awareness Impaired  Awareness Impairment Anticipatory impairment  Problem Solving Appears intact (during assessment-need to assess higher level)  Safety/Judgment Impaired  Auditory Comprehension  Overall Auditory Comprehension Impaired (for complex)  Yes/No Questions WFL  Commands  (for higher level)  Visual Recognition/Discrimination  Discrimination Not tested  Reading Comprehension  Reading Status  (will assess)  Expression  Primary Mode of Expression Verbal  Verbal Expression  Overall Verbal Expression Impaired  Initiation No impairment  Level of Generative/Spontaneous Verbalization Conversation  Repetition No impairment  Naming Impairment  Responsive 51-75% accurate  Confrontation Impaired (88% self corrected x 1)  Convergent  (NT)  Divergent Not tested  Verbal Errors Neologisms;Not aware of errors;Aware of errors  Pragmatics No impairment  Written Expression  Dominant Hand Right  Written Expression X  Dictation Ability Sentence  Self Formulation Ability Sentence  Motor Speech  Overall Motor Speech Impaired  Respiration WFL  Phonation Normal  Resonance WFL  Articulation Impaired  Level of Impairment Sentence  Intelligibility Intelligible  Motor Planning Impaired  Level of Impairment Conversation (imitation of words)  Motor Speech Errors Inconsistent  SLP - End of Session  Patient left in bed;with call bell/phone within reach  Nurse Communication Treatment plan  Assessment  Clinical Impression Statement (ACUTE ONLY) Pt exhibits 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  He is fluent in sentences which include neologisms, phonemic paraphasia's with sound omissions and distortions. He demonstrated groping and apraxic behaviors while imitating articulatory placement  of isolated vowel /e/ during dysphagia treatment. On the Western aphasia bedside assessment he followed sequential commands accurately. Writing to dictation as well as generative writing of biographical information was inaccurate including nonsensical words. Reading ability will be determined during treatment. Cognitive status for basic activities was functional however executive functioning level tasks prove challenging. He would benefit from ST in acute care and at next level of care in an inpatient rehab program.  SLP Recommendation/Assessment Patient needs continued [place] [facility] [facility]  SLP Visit Diagnosis Aphasia (R47.01);Apraxia (R48.2);Cognitive communication deficit (R41.841)  Problem List Auditory comprehension;Reading comprehension;Written expression  Plan  Speech Therapy Frequency (ACUTE ONLY) min 2x/week  Duration 2 weeks  Treatment/Interventions Language facilitation;Cueing hierarchy;Cognitive reorganization;SLP instruction and feedback;Compensatory strategies;Patient/family education  Potential to Achieve Goals (ACUTE ONLY) Good  SLP Recommendations  Recommendations for Other Services Rehab consult  Follow up Recommendations Inpatient Rehab  Individuals Consulted  Consulted and Agree with Results and Recommendations Patient  SLP Evaluations  $ SLP Speech Visit 1 Visit  SLP Evaluations  $ SLP EVAL LANGUAGE/SOUND PRODUCTION 1 Procedure    Orbie Pyo Hazeline Charnley M.Ed Risk analyst [phone] Office [phone]

## 2019-03-11 NOTE — Progress Notes (Signed)
STROKE TEAM PROGRESS NOTE   SUBJECTIVE (INTERVAL HISTORY) Patient continues to have expressive aphasia with dysphagia.  He is ambulating on the unit with the therapist next to him.  He is on IV heparin.  OBJECTIVE Vitals:   03/11/19 1000 03/11/19 1100 03/11/19 1200 03/11/19 1300  BP: 139/70 130/71 126/70 117/65  Pulse: (!) 57 (!) 54 61 (!) 47  Resp: 19 18 (!) 25 17  Temp:      TempSrc:      SpO2: 100% 99% 100% 100%  Weight:      Height:        CBC:  Recent Labs  Lab 03/09/19 0656 03/10/19 0545 03/11/19 0809  WBC 5.0 4.7 4.6  NEUTROABS 0.9*  --   --   HGB 12.5* 10.8* 9.5*  HCT 36.5* 31.2* 27.5*  MCV 91.9 89.7 88.7  PLT 125* 140* 146*    Basic Metabolic Panel:  Recent Labs  Lab 03/08/19 0906 03/09/19 0656  NA 136 139  K 4.1 3.8  CL 102 104  CO2 27 25  GLUCOSE 135* 98  BUN 10 7*  CREATININE 0.68 0.70  CALCIUM 8.5* 8.4*  MG 1.8  --     Lipid Panel:     Component Value Date/Time   CHOL 110 03/09/2019 0656   CHOL 159 03/09/2017 1032   TRIG 97 03/09/2019 0656   HDL 45 03/09/2019 0656   HDL 61 03/09/2017 1032   CHOLHDL 2.4 03/09/2019 0656   VLDL 19 03/09/2019 0656   LDLCALC 46 03/09/2019 0656   LDLCALC 66 03/09/2017 1032   HgbA1c:  Lab Results  Component Value Date   HGBA1C 5.5 03/09/2019    IMAGING Ct Head Code Stroke Wo Contrast 03/08/2019 1. Normal appearance of the brain itself. However, there is a definite hyperdense left M2 branch consistent with an acute embolus. Right frontal sinusitis.  2. ASPECTS is 10.   Ct Angio Head W Or Wo Contrast Ct Angio Neck W Or Wo Contrast 03/08/2019 Acute occlusion of the left ICA at its origin. Reconstitution of the ICA by collaterals at the skull base level. Occluded left M2 branch.  Ct Cerebral Perfusion W Contrast 03/08/2019 Acute core infarct involving the anterior left MCA distribution with moderate surrounding ischemic penumbra as above.   Interventional Radiology 03/09/2019 S/P bilateral common carotid  and RT vertbral arteriograms followed by attempted revascularization of occluded Lt ICA prox . MCA revascularization via collaterals TICI 2b to 2C .  MRI Head WO Contrast  03/09/2019 1. Acute Left MCA infarct involving the middle/anterior division territory similar to the CTP core estimate. Trace petechial hemorrhage (Heidelberg Classification 1a). No mass effect. 2. Asymmetrically decreased Left ICA petrous segment flow void, but otherwise preserved major vascular flow voids. 3. Indeterminate abnormal marrow signal in the clivus, which had an unremarkable CT appearance yesterday. Recommend follow-up Two-view Chest Radiographs when possible, and if negative a 3 month follow-up noncontrast Brain MRI to re-evaluate the clivus.   Transthoracic Echocardiogram  8/2//2020  1. The left ventricle has normal systolic function with an ejection fraction of 60-65%. The cavity size was normal. Left ventricular diastolic parameters were normal.  2. The right ventricle has normal systolic function. The cavity was normal. There is no increase in right ventricular wall thickness.  3. No evidence of mitral valve stenosis.  4. The tricuspid valve is grossly normal.  5. No stenosis of the aortic valve.  6. The aorta is normal in size and structure.  7. The aortic root and ascending  aorta are normal in size and structure.  8. The interatrial septum was not assessed.   PHYSICAL EXAM   Pleasant middle-aged Caucasian male not in distress. . Afebrile. Head is nontraumatic. Neck is supple without bruit.    Cardiac exam no murmur or gallop. Lungs are clear to auscultation. Distal pulses are well felt. Neurological Exam :  Awake alert oriented to time place and person.  Moderate expressive aphasia with word finding difficulties and paraphasic errors.  Able to speak occasional short sentences.  Good comprehension and follows commands well.  Poor naming and repetition.  Dysarthria present.    Mild right lower facial weakness.   Tongue midline.  Extraocular movements full range without nystagmus.  Blinks to threat bilaterally.  Motor system exam symmetric upper and lower extremity strength no focal weakness.  Fine finger movements are slightly diminished on the right.  Orbits left over right upper extremity.  Sensation appears preserved bilaterally.  Gait not tested.   ASSESSMENT/PLAN [name] is a 63 y.o. male with history of coronary artery disease, ongoing tobacco use, hx of NICM, tonsillar ca in remission, atrial fibrillation on anticoagulation which was held for surgery since last Tuesday - Surgery [name] 0n 03/06/19 - diverticulitis of the large intestine with abscess requiring exploratory laparotomy, low anterior resection rigid proctoscopy, appendectomy. Post op the pt developed speech difficulties. He did not receive IV t-PA due to recent surgery. He was transferred to [facility] where he went to IR with TICI 2b revascularization of the L MCA, w/ attempted revascularization of the prox L ICA.  Stroke:  Left MCA infarct embolic d/t known AF off AC for recent surgery s/p IR with attempted revascularization occluded left ICA and partial revascularization L MCA  CT head - Normal appearance of the brain itself. However, there is a definite hyperdense left M2 branch consistent with an acute embolus  CTA H&N - Acute occlusion of the left ICA at its origin. Reconstitution of the ICA by collaterals at the skull base level. Occluded left M2 branch.  CT Perfusion - Acute core infarct involving the anterior left MCA distribution with moderate surrounding ischemic penumbra   MRI head - L MCA infarct, trace petechial hmg. Decreased L ICA flow void. indeterminate abnormal marrow signal in the clivius. Check view CXR and iof neg a 3-mo f/u noncontrast MRI to re-eval the clivus  2D Echo  - EF 60 - 65%. No cardiac source of emboli identified.   Sars Corona Virus 2 - negative  LDL - 46  HgbA1c - 5.5  VTE prophylaxis -  Lovenox  Eliquis (apixaban) daily prior to admission, now on heparin IV. Ok to start oral AC from surgeon's standpoint; not yet cleared to swallow.   Therapy recommendations:  OP PT, OT, SLP  Disposition:  Pending  Atrial Fibrillation  Home anticoagulation:  Eliquis (apixaban) daily - on hold for recent surgery  Now on IV heparin  Ok for [facility] per surgeon but unable to swallow  Plan AC once able to take pos   ICA occlusion  Chronically occluded L ICA  Attempted revascxularization  Hypertension  Stable . On phenylephrine - weaning . BP goal per IR 24h post IR  . BP goal now  SBP 120-140 . Long-term BP goal normotensive  Hyperlipidemia  Lipid lowering medication PTA:  Lipitor 40 mg daily  LDL 46, goal < 70  Current lipid lowering medication:  Lipitor 40 mg daily  Continue statin at discharge  Dysphagia . Secondary to stroke .  NPO . Failed MBS . Cortrak placed w/ tube feedings . Speech following . Surgeon has okayed solids once able to swallow . Hope for improvement over the next few days.    Other Stroke Risk Factors  Advanced age  Cigarette smoker - advised to stop smoking  ETOH use, advised to drink no more than 1 alcoholic beverage per day.  Coronary artery disease  Nonischemic CM  Other Active Problems  Mild Thrombocytopenia - 125  S/P exploratory laparotomy, low anterior resection rigid proctoscopy, appendectomy 03/06/19 - path c/w diverticulitis. benign  CBC w/ atypical lymphocytes  History of tonsillar cancer treated with radiation and cisplatin and 2005. Had a G-tube placed at the time.  Hospital day # 4  Continue feeds and medications were core tract tube till his dysphagia improves.  Speech therapy continues to follow him.  Continue IV heparin and then switch to Eliquis when he is able to swallow.  Greater than 50% time during this 25-minute visit was spent on counseling and coordination of care and discussion with care team  Antony Contras, MD Medical Director [place] Pager: [phone] 03/11/2019 1:19 PM   To contact Stroke Continuity provider, please refer to [email or web address] After hours, contact General Neurology

## 2019-03-11 NOTE — Progress Notes (Signed)
Physical Therapy Treatment Patient Details Name: Adrian [name] MRN: [id number] DOB: [birth year] Today's Date: 03/11/2019    History of Present Illness Pt is a 63 y.o. M with significant PMH of CAD, tonsillar CA in remission, A-fib on anticoagulation which was held for recent surgery who was transferred emergently from [facility] where had been admitted for diverticulitis of large intestine with abscess requiring exploratory laparotomy, low anterior resection rigid proctoscopy, appendectomy. Presents with difficulty speaking. CTA head and neck showing left cervical carotid occlusion as well as left MCA occlusion. Attempted mechanical thrombectomy was unsuccessful and carotid artery also could not be revascularized.  GHW:EXHBZ Left MCA infarct involving the middle/anterior divisionterritory.    PT Comments    Patient progressing slowly towards PT goals. Demonstrates cognitive, language and balance deficits impacting safe mobility. Pt with difficulty dual tasking during ambulation- not able to serial count by 5s from 100 even with options. Not able to recall 0/3 words to assess STM. Difficulty with path finding when performing a cognitive task and forgetting original directions. Pt continues to have expressive language deficits. Highly motivated. Pt is far from independent baseline with regards to cognition and language. Tolerated higher level balance challenges- changes in direction, gait speed, turns etc with a LOB requiring assist to correct. Discharge recommendation updated to CIR due to above deficits. Concerned about safety at home. Will follow.   Follow Up Recommendations  CIR;Supervision for mobility/OOB;Supervision/Assistance - 24 hour     Equipment Recommendations  None recommended by PT    Recommendations for Other Services Rehab consult     Precautions / Restrictions Precautions Precautions: Fall Precaution Comments: SBP 120-140; NG tube Restrictions Weight Bearing Restrictions: No     Mobility  Bed Mobility Overal bed mobility: Needs Assistance Bed Mobility: Sit to Supine       Sit to supine: Supervision   General bed mobility comments: No assist needed. Managed lines.  Transfers Overall transfer level: Needs assistance Equipment used: None Transfers: Sit to/from Stand Sit to Stand: Min guard         General transfer comment: Min guard for safety. Impulsive for all transitions/movements. Holding lines. Stood from Automotive engineer.  Ambulation/Gait Ambulation/Gait assistance: Min assist Gait Distance (Feet): 250 Feet(x3) Assistive device: None Gait Pattern/deviations: Step-through pattern;Staggering right;Staggering left Gait velocity: fast, unsafe at times Gait velocity interpretation: >2.62 ft/sec, indicative of community ambulatory General Gait Details: Mildly unsteady gait especially with higher level balance challenges or with cognitive tasks. Staggering to right/left. See balance section for details.   Stairs             Wheelchair Mobility    Modified Rankin (Stroke Patients Only) Modified Rankin (Stroke Patients Only) Pre-Morbid Rankin Score: No symptoms Modified Rankin: Moderately severe disability     Balance Overall balance assessment: Needs assistance Sitting-balance support: Feet supported;No upper extremity supported Sitting balance-Leahy Scale: Good     Standing balance support: During functional activity Standing balance-Leahy Scale: Fair               High level balance activites: Direction changes;Turns;Sudden stops;Head turns High Level Balance Comments: Tolerated above with deviations in gait requiring min A at times; staggering noted. Changes in gait speed with LOB going slowly.            Cognition Arousal/Alertness: Awake/alert Behavior During Therapy: WFL for tasks assessed/performed Overall Cognitive Status: Impaired/Different from baseline Area of Impairment: Memory;Problem  solving;Safety/judgement;Awareness;Orientation  Orientation Level: Disoriented to;Time("July")   Memory: Decreased short-term memory   Safety/Judgement: Decreased awareness of deficits Awareness: Emergent Problem Solving: Slow processing;Requires verbal cues General Comments: Difficulty with dual tasking during ambulation; not able to count backwards from 100 by 5s with options. Expressive difficulties. Difficulty with pathfinding tasks in hallway when performing another cognitive task simultaneously. Able to recall 0/3 words for STM recall.      Exercises      General Comments General comments (skin integrity, edema, etc.): VSS throughout.      Pertinent Vitals/Pain Pain Assessment: Faces Faces Pain Scale: No hurt    Home Living                      Prior Function            PT Goals (current goals can now be found in the care plan section) Progress towards PT goals: Progressing toward goals    Frequency    Min 4X/week      PT Plan Discharge plan needs to be updated    Co-evaluation              AM-PAC PT "6 Clicks" Mobility   Outcome Measure  Help needed turning from your back to your side while in a flat bed without using bedrails?: None Help needed moving from lying on your back to sitting on the side of a flat bed without using bedrails?: None Help needed moving to and from a bed to a chair (including a wheelchair)?: None Help needed standing up from a chair using your arms (e.g., wheelchair or bedside chair)?: None Help needed to walk in hospital room?: A Little Help needed climbing 3-5 steps with a railing? : A Little 6 Click Score: 22    End of Session Equipment Utilized During Treatment: Gait belt Activity Tolerance: Patient tolerated treatment well Patient left: in bed;with call bell/phone within reach;with bed alarm set Nurse Communication: Mobility status PT Visit Diagnosis: Unsteadiness on feet  (R26.81);Difficulty in walking, not elsewhere classified (R26.2)     Time: 1308-6578 PT Time Calculation (min) (ACUTE ONLY): 29 min  Charges:  $Gait Training: 8-22 mins $Neuromuscular Re-education: 8-22 mins                     Wray Kearns, [place], DPT Acute Rehabilitation Services Pager [phone] Office [place] 03/11/2019, 10:16 AM

## 2019-03-11 NOTE — Progress Notes (Signed)
Rehab Admissions Coordinator Note:  Per PT recommendation, this patient was screened by Adrian [name] for appropriateness for an Inpatient Acute Rehab Consult.  At this time, pt is ambulating too well to require an IP Rehab stay. Per OT evaluation, It appears his wife is able to assist him at DC. AC would recommend Outpatient therapy.    Adrian [name] 03/11/2019, 10:24 AM  I can be reached at [phone].

## 2019-03-11 NOTE — Progress Notes (Signed)
RN assessed groin site at 1800 and removed gauze to change to band-aid.  Noted a level 2 hematoma.  Dr. Estanislado Pandy paged and verbal orders received.  RN held pressure for 20 minutes, groin site marked, and educated patient to keep leg straight for next six hour starting at 1830.  RN will continue to monitor.

## 2019-03-11 NOTE — Progress Notes (Signed)
Adrian [name] [id number] 05-Nov-1961  CARE TEAM:  PCP: [name], MD  Outpatient Care Team: Patient Care Team: [name], MD as PCP - General (Family Medicine) Sueanne Margarita, MD as PCP - Cardiology (Cardiology) Sueanne Margarita, MD as Consulting Physician (Cardiology) Michael Boston, MD as Consulting Physician (General Surgery) Irene Shipper, MD as Consulting Physician (Gastroenterology) Izora Gala, MD as Consulting Physician (Otolaryngology) Garvin Fila, MD as Consulting Physician (Neurology)  Inpatient Treatment Team: Treatment Team: Attending Provider: Garvin Fila, MD; Technician: Leda Quail, [employer]; Rounding Team: Stroke, Md, MD; Registered Nurse: Lubertha South, RN; Rounding Team: [name] Radiology, MD; Registered Nurse: Fortino Sic, RN; Technician: Wylene Men, [place]; Consulting Physician: Michael Boston, MD; Registered Nurse: White [place], Melbourne Abts, RN; Registered Nurse: Delsa Bern, RN; Utilization Review: Sindy Guadeloupe, RN; Physical Therapist: Lacie Draft, PT   Problem List:   Principal Problem:   Middle cerebral artery embolism, left Active Problems:   Expressive aphasia   NICM (nonischemic cardiomyopathy) ([place])   Diverticulitis of large intestine with abscess   History of radiation therapy   Smokes tobacco daily   Hypothyroidism   CAD (coronary artery disease), native coronary artery   Chronic atrial fibrillation   Current use of long term anticoagulation   Tonsil cancer ([place])   History of chemotherapy   Diverticular stricture ([place])   Dysphagia  03/07/2019  POST-OPERATIVE DIAGNOSIS:  SIGMOID DIVERTICULITIS WITH HISTORY OF ABSCESS  PROCEDURE:   XI ROBOTIC ASSISTED LOW ANTERIOR RESECTION RIGID PROCTOSCOPY ASSESSMENT OF TISSUE PERFUSION BY FIREFLY IMMUNOFLUORESCNECE ROBOTIC ASSISTED APPENDECTOMY  SURGEON:  [name], MD, FACS, [place]  03/09/2019  Interventional  Radiology S/P bilateral common carotid and RT vertbral arteriograms followed by attempted revascularization of occluded Lt ICA prox . MCA revascularization via collaterals TICI 2b to 2C .  Assessment  Stabilizing status post stroke cerebral artery with expressive aphasia.  Neurology, Neuroradiology, ICU, RN, Code Stroke teams help appreciated  [facility] Stay = 63 days)  Plan:  -Continue rehab in the hopes that aphasia and dysphasia will improve this hospitalization as neurology helps.  Defer to their expertise.  OK to d/c o/w from surgery standpoint -bowel functioning - OK to adv diet as tolerated to solids once speech Tx determines safest course.  Assuming Dys1 to Dys3 evetually -full gtt anticoagulation -Expressive aphasia persists but slightly improving. - agree w speech therapy, PT/OT, anticoagulation -intentional HTN w Neo for now -f/u pathology -consistent with diverticulitis.  Benign.  Discussed with patient. -VTE prophylaxis- SCDs, etc -mobilize as tolerated to help recovery  35 minutes spent in review, evaluation, examination, counseling, and coordination of care.  More than 50% of that time was spent in counseling.  03/11/2019    Subjective: (Chief complaint)  Failed modified barium swallow.  CorPak placed.  On tube feeds.  Seems more alert.  Walking to bathroom with medical person assist.  Feels better and stronger.  Objective:  Vital signs:  Vitals:   03/11/19 0515 03/11/19 0530 03/11/19 0545 03/11/19 0600  BP: 135/69 135/72 133/72 123/69  Pulse: (!) 59 (!) 59 (!) 58 63  Resp: 17 18 16 13   Temp:      TempSrc:      SpO2: 99% 99% 99% 97%  Weight:      Height:        Last BM Date: 03/06/19  Intake/Output   Yesterday:  08/03 0701 - 08/04 0700 In: 2521.6 [I.V.:2521.6] Out: -  This shift:  Total I/O In: 1629.6 [I.V.:1629.6] Out: -   Bowel function:  Flatus: YES  BM:  YES  Drain: (No drain)   Physical Exam:  General: Pt  awake/alert/oriented x4 in no acute distress Eyes: PERRL, normal EOM.  Sclera clear.  No icterus  Neuro: CN II-XII intact w/o focal sensory/motor deficits.  Expressive aphasia milder.  Able to answer questions more easily.  Lymph: No head/neck/groin lymphadenopathy Psych:  No delerium/psychosis/paranoia HENT: Normocephalic, Mucus membranes moist.  No thrush Neck: Supple, No tracheal deviation Chest: No chest wall pain w good excursion CV:  Pulses intact.  Regular rhythm MS: Normal AROM mjr joints.  No obvious deformity  Abdomen: Soft.  Nondistended.  Nontender.  Incisions normal healing ridges.  Little discomfort at Pfannenstiel site.  No cellulitis or abscess.  No evidence of peritonitis.  No incarcerated hernias.  Ext:   No deformity.  No mjr edema.  No cyanosis Skin: No petechiae / purpura  Results:   Cultures: Recent Results (from the past 720 hour(s))  SARS Coronavirus 2 (Performed in [place] hospital lab)     Status: None   Collection Time: 03/04/19  9:47 AM   Specimen: Nasal Swab  Result Value Ref Range Status   SARS Coronavirus 2 NEGATIVE NEGATIVE Final    Comment: (NOTE) SARS-CoV-2 target nucleic acids are NOT DETECTED. The SARS-CoV-2 RNA is generally detectable in upper and lower respiratory specimens during the acute phase of infection. Negative results do not preclude SARS-CoV-2 infection, do not rule out co-infections with other pathogens, and should not be used as the sole basis for treatment or other patient management decisions. Negative results must be combined with clinical observations, patient history, and epidemiological information. The expected result is Negative. Fact Sheet for Patients: SugarRoll.be Fact Sheet for Healthcare Providers: https://www.woods-mathews.com/ This test is not yet approved or cleared by the [place] FDA and  has been authorized for detection and/or diagnosis of SARS-CoV-2 by FDA  under an Emergency Use Authorization (EUA). This EUA will remain  in effect (meaning this test can be used) for the duration of the COVID-19 declaration under Section 56 4(b)(1) of the Act, 21 U.S.C. section 360bbb-3(b)(1), unless the authorization is terminated or revoked sooner. Performed at [facility], [place] [address] [place], [place] [address]   MRSA PCR Screening     Status: None   Collection Time: 03/08/19 10:44 PM   Specimen: Nasal Mucosa; Nasopharyngeal  Result Value Ref Range Status   MRSA by PCR NEGATIVE NEGATIVE Final    Comment:        The GeneXpert MRSA Assay (FDA approved for NASAL specimens only), is one component of a comprehensive MRSA colonization surveillance program. It is not intended to diagnose MRSA infection nor to guide or monitor treatment for MRSA infections. Performed at [facility], [place] [address], [place], [place] [address]     Labs: Results for orders placed or performed during the hospital encounter of 03/07/19 (from the past 48 hour(s))  Hemoglobin A1c     Status: None   Collection Time: 03/09/19  6:56 AM  Result Value Ref Range   Hgb A1c MFr Bld 5.5 4.8 - 5.6 %    Comment: (NOTE) Pre diabetes:          5.7%-6.4% Diabetes:              >6.4% Glycemic control for   <7.0% adults with diabetes    Mean Plasma Glucose 111.15 mg/dL    Comment: Performed  at [facility], [place] [address] [place], [place] [address]  Lipid panel     Status: None   Collection Time: 03/09/19  6:56 AM  Result Value Ref Range   Cholesterol 110 0 - 200 mg/dL   Triglycerides 97 <150 mg/dL   HDL 45 >40 mg/dL   Total CHOL/HDL Ratio 2.4 RATIO   VLDL 19 0 - 40 mg/dL   LDL Cholesterol 46 0 - 99 mg/dL    Comment:        Total Cholesterol/HDL:CHD Risk Coronary Heart Disease Risk Table                     Men   Women  1/2 Average Risk   3.4   3.3  Average Risk       5.0   4.4  2 X Average Risk   9.6   7.1  3 X Average Risk  23.4   11.0         Use the calculated Patient Ratio above and the CHD Risk Table to determine the patient's CHD Risk.        ATP III CLASSIFICATION (LDL):  <100     mg/dL   Optimal  100-129  mg/dL   Near or Above                    Optimal  130-159  mg/dL   Borderline  160-189  mg/dL   High  >190     mg/dL   Very High Performed at [facility] [address] [place], [place] [address]   CBC with Differential/Platelet     Status: Abnormal   Collection Time: 03/09/19  6:56 AM  Result Value Ref Range   WBC 5.0 4.0 - 10.5 K/uL   RBC 3.97 (L) 4.22 - 5.81 MIL/uL   Hemoglobin 12.5 (L) 13.0 - 17.0 g/dL   HCT 36.5 (L) 39.0 - 52.0 %   MCV 91.9 80.0 - 100.0 fL   MCH 31.5 26.0 - 34.0 pg   MCHC 34.2 30.0 - 36.0 g/dL   RDW 16.2 (H) 11.5 - 15.5 %   Platelets 125 (L) 150 - 400 K/uL    Comment: REPEATED TO VERIFY PLATELET COUNT CONFIRMED BY SMEAR SPECIMEN CHECKED FOR CLOTS    nRBC 0.0 0.0 - 0.2 %   Neutrophils Relative % 14 %   Lymphocytes Relative 70 %   Monocytes Relative 12 %   Eosinophils Relative 0 %   Basophils Relative 0 %   Band Neutrophils 4 %   Metamyelocytes Relative 0 %   Myelocytes 0 %   Promyelocytes Relative 0 %   Blasts 0 %   nRBC 0 0 /100 WBC   Other 0 %   Neutro Abs 0.9 (L) 1.7 - 7.7 K/uL   Lymphs Abs 3.5 0.7 - 4.0 K/uL   Monocytes Absolute 0.6 0.1 - 1.0 K/uL   Eosinophils Absolute 0.0 0.0 - 0.5 K/uL   Basophils Absolute 0.0 0.0 - 0.1 K/uL   WBC Morphology ATYPICAL LYMPHOCYTES     Comment: SMUDGE CELLS ABSOLUTE LYMPHOCYTOSIS Performed at [facility], [address], [place], [place] [address]   Basic metabolic panel     Status: Abnormal   Collection Time: 03/09/19  6:56 AM  Result Value Ref Range   Sodium 139 135 - 145 mmol/L   Potassium 3.8 3.5 - 5.1 mmol/L   Chloride 104 98 - 111 mmol/L   CO2  25 22 - 32 mmol/L   Glucose, Bld 98 70 - 99 mg/dL   BUN 7 (L) 8 - 23 mg/dL   Creatinine, Ser 0.70 0.61 - 1.24 mg/dL   Calcium 8.4 (L) 8.9 - 10.3 mg/dL   GFR calc  non Af Amer >60 >60 mL/min   GFR calc Af Amer >60 >60 mL/min   Anion gap 10 5 - 15    Comment: Performed at [facility] [address], [place], [place] [address]  Pathologist smear review     Status: None   Collection Time: 03/09/19  6:56 AM  Result Value Ref Range   Path Review Normocytic anemia and thrombocytopenia.     Comment: Reviewed by Marlynn Perking. Melina Copa, M.D. 03/10/2019. Performed at [facility], [place] [address] [place], [place] [address]   Heparin level (unfractionated)     Status: Abnormal   Collection Time: 03/09/19  2:29 PM  Result Value Ref Range   Heparin Unfractionated 0.17 (L) 0.30 - 0.70 IU/mL    Comment: (NOTE) If heparin results are below expected values, and patient dosage has  been confirmed, suggest follow up testing of antithrombin III levels. Performed at [facility], [place] [address], [place], [place] [address]   APTT     Status: Abnormal   Collection Time: 03/09/19  2:29 PM  Result Value Ref Range   aPTT 39 (H) 24 - 36 seconds    Comment:        IF BASELINE aPTT IS ELEVATED, SUGGEST PATIENT RISK ASSESSMENT BE USED TO DETERMINE APPROPRIATE ANTICOAGULANT THERAPY. Performed at [facility], [place] [address], [place], [place] [address]   APTT     Status: Abnormal   Collection Time: 03/09/19  9:27 PM  Result Value Ref Range   aPTT 55 (H) 24 - 36 seconds    Comment:        IF BASELINE aPTT IS ELEVATED, SUGGEST PATIENT RISK ASSESSMENT BE USED TO DETERMINE APPROPRIATE ANTICOAGULANT THERAPY. Performed at [facility], [place] [address] [place], [place] [address]   Heparin level (unfractionated)     Status: Abnormal   Collection Time: 03/09/19  9:27 PM  Result Value Ref Range   Heparin Unfractionated 0.13 (L) 0.30 - 0.70 IU/mL    Comment: (NOTE) If heparin results are below expected values, and patient dosage has  been confirmed, suggest follow up testing of antithrombin III levels. Performed at [facility], [place] [address], [place], [place] [address]   CBC     Status: Abnormal   Collection Time: 03/10/19  5:45 AM  Result Value Ref Range   WBC 4.7 4.0 - 10.5 K/uL   RBC 3.48 (L) 4.22 - 5.81 MIL/uL   Hemoglobin 10.8 (L) 13.0 - 17.0 g/dL   HCT 31.2 (L) 39.0 - 52.0 %   MCV 89.7 80.0 - 100.0 fL   MCH 31.0 26.0 - 34.0 pg   MCHC 34.6 30.0 - 36.0 g/dL   RDW 15.8 (H) 11.5 - 15.5 %   Platelets 140 (L) 150 - 400 K/uL   nRBC 0.0 0.0 - 0.2 %    Comment: Performed at [facility], [place] [address] [place], [place] [address]  Heparin level (unfractionated)     Status: Abnormal   Collection Time: 03/10/19  5:45 AM  Result Value Ref Range   Heparin Unfractionated <0.10 (L) 0.30 - 0.70 IU/mL    Comment: (NOTE) If heparin results are below  expected values, and patient dosage has  been confirmed, suggest follow up testing of antithrombin III levels. Performed at [facility], [place] [address], [place], [place] [address]   APTT     Status: Abnormal   Collection Time: 03/10/19  5:45 AM  Result Value Ref Range   aPTT 51 (H) 24 - 36 seconds    Comment:        IF BASELINE aPTT IS ELEVATED, SUGGEST PATIENT RISK ASSESSMENT BE USED TO DETERMINE APPROPRIATE ANTICOAGULANT THERAPY. Performed at [facility], [place] [address], [place], [place] [address]   Heparin level (unfractionated)     Status: Abnormal   Collection Time: 03/10/19 12:35 PM  Result Value Ref Range   Heparin Unfractionated <0.10 (L) 0.30 - 0.70 IU/mL    Comment: (NOTE) If heparin results are below expected values, and patient dosage has  been confirmed, suggest follow up testing of antithrombin III levels. Performed at [facility], [place] [address] [place], [place] [address]   APTT     Status: None   Collection Time: 03/10/19 12:35 PM  Result Value Ref Range   aPTT 25 24 - 36 seconds    Comment: Performed at [facility] [address], [place], [place] [address]  Heparin level (unfractionated)     Status: Abnormal    Collection Time: 03/10/19  8:34 PM  Result Value Ref Range   Heparin Unfractionated 0.28 (L) 0.30 - 0.70 IU/mL    Comment: (NOTE) If heparin results are below expected values, and patient dosage has  been confirmed, suggest follow up testing of antithrombin III levels. Performed at [facility], [place] [address] [place], [place] [address]   APTT     Status: Abnormal   Collection Time: 03/10/19  8:34 PM  Result Value Ref Range   aPTT 88 (H) 24 - 36 seconds    Comment:        IF BASELINE aPTT IS ELEVATED, SUGGEST PATIENT RISK ASSESSMENT BE USED TO DETERMINE APPROPRIATE ANTICOAGULANT THERAPY. Performed at [facility], [place] [address] [place], [place] [address]     Imaging / Studies: Mr Brain 90 Contrast  Result Date: 03/09/2019 CLINICAL DATA:  63 year old male emergent left ICA and left M2 Large Vessel occlusion status post endovascular treatment. EXAM: MRI HEAD WITHOUT CONTRAST TECHNIQUE: Multiplanar, multiecho pulse sequences of the brain and surrounding structures were obtained without intravenous contrast. COMPARISON:  CT head, CTA head and neck and CT perfusion 03/08/2019. FINDINGS: Brain: Restricted diffusion in the left MCA middle to anterior division territory involving most of the insula, left operculum cortex, cortex of the left inferior frontal gyrus and tracking cephalad to the left middle frontal gyrus (series 7, image 65). This is similar to the estimated core infarct distribution on CTP, perhaps slightly increased in extent. Associated cytotoxic edema with T2 and FLAIR hyperintensity. Superimposed small area of superior left parietal lobe cortical encephalomalacia on series 9, image 21. Trace petechial hemorrhage at the left insula on series 12, image 26. No contralateral right hemisphere or posterior fossa restricted diffusion. No midline shift, mass effect, evidence of mass lesion, ventriculomegaly, extra-axial collection or acute intracranial hemorrhage.  Cervicomedullary junction and pituitary are within normal limits. Vascular: Major intracranial vascular flow voids are preserved, although the left ICA petrous segment flow void is diminished on series 15, image 6. There is some asymmetric left MCA branch FLAIR signal (series 9, image 13). Skull and upper cervical spine: Chronic disc  and endplate degeneration at C3-C4 with mild spinal stenosis at that level. There is heterogeneous marrow signal in the clivus on series 14, image 12 which is also conspicuous on diffusion-weighted imaging., but elsewhere visible bone marrow signal is within normal limits. This area appeared normal by CT. Sinuses/Orbits: Negative orbits. Right frontal and frontoethmoidal sinus disease redemonstrated. Other: Mastoids are clear. Visible internal auditory structures appear normal. Scalp and face soft tissues appear negative. IMPRESSION: 1. Acute Left MCA infarct involving the middle/anterior division territory similar to the CTP core estimate. Trace petechial hemorrhage (Heidelberg Classification 1a). No mass effect. 2. Asymmetrically decreased Left ICA petrous segment flow void, but otherwise preserved major vascular flow voids. 3. Indeterminate abnormal marrow signal in the clivus, which had an unremarkable CT appearance yesterday. Recommend follow-up Two-view Chest Radiographs when possible, and if negative a 3 month follow-up noncontrast Brain MRI to re-evaluate the clivus. Electronically Signed   By: Genevie Ann M.D.   On: 03/09/2019 13:21   Dg Swallowing Func-speech Pathology  Result Date: 03/10/2019 Objective Swallowing Evaluation: Type of Study: MBS-Modified Barium Swallow Study  Patient Details Name: Adrian [name] MRN: [id number] Date of Birth: 1955/09/25 Today's Date: 03/10/2019 Time: SLP Start Time (ACUTE ONLY): 0907 -SLP Stop Time (ACUTE ONLY): 0926 SLP Time Calculation (min) (ACUTE ONLY): 19 min Past Medical History: Past Medical History: Diagnosis Date  AF (paroxysmal atrial  fibrillation) ([place]) 11/29/2018  Atrial flutter ([place]) 10/12/2015  CAD (coronary artery disease), native coronary artery 12/06/2015  a. 10/2015 MV: EF  37%, reversible defect inferior apex, intermediate risk findings; b. 10/2015 Cath: 20% mid RCA.  Colonic diverticular abscess   Diverticulitis   History of chemotherapy 2005  Cisplatin  Hypothyroidism   NICM (nonischemic cardiomyopathy) ([place]) 10/14/2015  a. Tachy mediated?;  b. Echo 3/17 - Mild concentric LVH, EF 30-35%, anteroseptal, anterior, anterolateral, apical anterior, lateral hypokinesis, trivial MR, mild to moderately reduced RVSF; c. LHC 3/17 - mRCA 20%  Paroxysmal atrial flutter ([place])   a. TEE 3/17 with ? LAA clot-->s/p TEE/DCCV 11/24/2015  Radiation NOv.3,2005-Dec. 15, 2005  6810 cGy in 30 fractions  Tonsil cancer [facility]) 2005  Dr Romeo Rabon.  XRT Past Surgical History: Past Surgical History: Procedure Laterality Date  APPENDECTOMY N/A 03/07/2019  Procedure: ROBOTIC ASSISTED APPENDECTOMY;  Surgeon: Michael Boston, MD;  Location: [facility];  Service: General;  Laterality: N/A;  CARDIAC CATHETERIZATION N/A 10/18/2015  Procedure: Left Heart Cath and Coronary Angiography;  Surgeon: Burnell Blanks, MD;  Location: [facility];  Service: Cardiovascular;  Laterality: N/A;  CARDIOVERSION N/A 11/24/2015  Procedure: CARDIOVERSION;  Surgeon: Josue Hector, MD;  Location: [facility];  Service: Cardiovascular;  Laterality: N/A;  CYSTOSCOPY WITH STENT PLACEMENT Bilateral 03/07/2019  Procedure: CYSTOSCOPY WITH BILATERAL FIREFLY INJECTION;  Surgeon: Ardis Hughs, MD;  Location: [facility];  Service: Urology;  Laterality: Bilateral;  GASTROSTOMY TUBE PLACEMENT  07/04/2004  IR - G tube for tonsilar cancer  IR RADIOLOGIST EVAL & MGMT  12/18/2018  LAMINECTOMY    C5/placement of steel plate  NECK SURGERY  2003  replaced disk  TEE WITHOUT CARDIOVERSION N/A 10/15/2015  Procedure: TRANSESOPHAGEAL ECHOCARDIOGRAM (TEE);  Surgeon: Larey Dresser, MD;  Location: [facility];  Service: Cardiovascular;  Laterality: N/A;  TEE WITHOUT CARDIOVERSION N/A 11/24/2015  Procedure: TRANSESOPHAGEAL ECHOCARDIOGRAM (TEE);  Surgeon: Josue Hector, MD;  Location: [facility];  Service: Cardiovascular;  Laterality: N/A;  XI ROBOTIC ASSISTED COLOSTOMY TAKEDOWN N/A 03/07/2019  Procedure: XI ROBOTIC ASSISTED LOW ANTERIOR RESECTION, RIGID PROCTOSCOPY;  Surgeon:  Michael Boston, MD;  Location: [facility];  Service: General;  Laterality: N/A; HPI: BERNAL LUHMAN is a 63 y.o. male coronary artery disease, tonsillar ca in remission, atrial fibrillation on anticoagulation which was held for surgery since last Tuesday, transferred emerg[facility] where he was admitted for diverticulitis of the large intestine with abscess requiring exploratory laparotomy, low anterior resection rigid proctoscopy, appendectomy, last seen normal at 7:30 PM on 03/08/2019 at [facility] when it was noticed that he had difficulty with speech- speech did not make sense. CTA head and neck was also done which revealed a left cervical carotid occlusion as well as left MCA occlusion. S/P bilateral common carotid and RT vertbral arteriograms followed by attempted revascularization of occluded Lt ICA prox .MCA revascularization via collaterals TICI 2b to 2C . MRI shows Restricted diffusion in the left MCA middle to anterior division territory involving most of the insula, left operculum  Subjective: pt alert, aphasic Assessment / Plan / Recommendation CHL IP CLINICAL [name] 03/10/2019 Clinical Impression Pt has a moderate to severe oropharyngeal dysphagia that is suspected to be acute on chronic given new stroke in addition to prior ACDF and XRT for tonsillar cancer. His oral phase is mildly prolonged, more so as boluses become thicker and mor solid. Bolus cohesion is reduced and spills backward into the pharynx not as a formed bolus, at times using a posterior head tilt to initiate posterior propulsion  (making chin tuck a difficult strategy to use). Pharyngeally he exhibits reduced pharyngeal squeeze, base of tongue retraction, and hyolaryngeal movement. Epiglottic inversion and airway closure are reduced. He often penetrates during the swallow as a result. More significantly, he also has frequent aspiration before the swallow with thin liquids (a combined result of oral and pharyngeal impairments) and after the swallow with all consistencies. Moderate residue primarily at the valleculae with thin liquids increases up to more severe residue also at the pyriform sinuses with limited entrance into the UES with purees. Varying volume and bolus presentation did not increase airway protection or efficiency of swallow. Most aspiration goes unsensed, and his cued cough and intermittent spontaneous are only able to partially clear the airway. Recommend that he remain NPO with consideration of temporary, alternative means of nutrition. Could also provided a few single ice chips after oral care to utilize swallowing musculature and facilitate secretion management. Will f/u for exercises to maximize swallow and cough as well as completion of speech-language evaluation.  SLP Visit Diagnosis Dysphagia, oropharyngeal phase (R13.12) Attention and concentration deficit following -- Frontal lobe and executive function deficit following -- Impact on safety and function Severe aspiration risk   CHL IP TREATMENT RECOMMENDATION 03/10/2019 Treatment Recommendations Therapy as outlined in treatment plan below   Prognosis 03/10/2019 Prognosis for Safe Diet Advancement Good Barriers to Reach Goals Severity of deficits Barriers/Prognosis Comment -- CHL IP DIET RECOMMENDATION 03/10/2019 SLP Diet Recommendations NPO;Alternative means - temporary Liquid Administration via -- Medication Administration Via alternative means Compensations -- Postural Changes --   CHL IP OTHER RECOMMENDATIONS 03/10/2019 Recommended Consults -- Oral Care Recommendations  Oral care QID Other Recommendations Have oral suction available   CHL IP FOLLOW UP RECOMMENDATIONS 03/10/2019 Follow up Recommendations Inpatient Rehab   CHL IP FREQUENCY AND DURATION 03/10/2019 Speech Therapy Frequency (ACUTE ONLY) min 2x/week Treatment Duration 2 weeks      CHL IP ORAL PHASE 03/10/2019 Oral Phase Impaired Oral - Pudding Teaspoon -- Oral - Pudding Cup -- Oral - Honey Teaspoon -- Oral -  Honey Cup Reduced posterior propulsion;Decreased bolus cohesion Oral - Nectar Teaspoon Reduced posterior propulsion;Decreased bolus cohesion Oral - Nectar Cup Reduced posterior propulsion;Decreased bolus cohesion Oral - Nectar Straw -- Oral - Thin Teaspoon Reduced posterior propulsion;Decreased bolus cohesion Oral - Thin Cup Reduced posterior propulsion;Decreased bolus cohesion Oral - Thin Straw -- Oral - Puree Reduced posterior propulsion;Delayed oral transit;Decreased bolus cohesion Oral - Mech Soft -- Oral - Regular -- Oral - Multi-Consistency -- Oral - Pill -- Oral Phase - Comment --  CHL IP PHARYNGEAL PHASE 03/10/2019 Pharyngeal Phase -- Pharyngeal- Pudding Teaspoon -- Pharyngeal -- Pharyngeal- Pudding Cup -- Pharyngeal -- Pharyngeal- Honey Teaspoon -- Pharyngeal -- Pharyngeal- Honey Cup Reduced pharyngeal peristalsis;Reduced epiglottic inversion;Reduced anterior laryngeal mobility;Reduced laryngeal elevation;Reduced airway/laryngeal closure;Reduced tongue base retraction;Pharyngeal residue - valleculae;Pharyngeal residue - pyriform;Penetration/Apiration after swallow Pharyngeal Material enters airway, passes BELOW cords without attempt by patient to eject out (silent aspiration) Pharyngeal- Nectar Teaspoon Reduced pharyngeal peristalsis;Reduced epiglottic inversion;Reduced anterior laryngeal mobility;Reduced laryngeal elevation;Reduced airway/laryngeal closure;Reduced tongue base retraction;Pharyngeal residue - valleculae;Penetration/Apiration after swallow;Pharyngeal residue - pyriform Pharyngeal Material enters  airway, passes BELOW cords without attempt by patient to eject out (silent aspiration) Pharyngeal- Nectar Cup Reduced pharyngeal peristalsis;Reduced epiglottic inversion;Reduced anterior laryngeal mobility;Reduced laryngeal elevation;Reduced airway/laryngeal closure;Reduced tongue base retraction;Pharyngeal residue - valleculae;Penetration/Apiration after swallow;Pharyngeal residue - pyriform Pharyngeal Material enters airway, passes BELOW cords without attempt by patient to eject out (silent aspiration) Pharyngeal- Nectar Straw -- Pharyngeal -- Pharyngeal- Thin Teaspoon Reduced pharyngeal peristalsis;Reduced epiglottic inversion;Reduced anterior laryngeal mobility;Reduced laryngeal elevation;Reduced airway/laryngeal closure;Reduced tongue base retraction;Pharyngeal residue - valleculae Pharyngeal -- Pharyngeal- Thin Cup Reduced pharyngeal peristalsis;Reduced epiglottic inversion;Reduced anterior laryngeal mobility;Reduced laryngeal elevation;Reduced airway/laryngeal closure;Reduced tongue base retraction;Pharyngeal residue - valleculae;Penetration/Aspiration before swallow;Penetration/Apiration after swallow;Pharyngeal residue - pyriform Pharyngeal Material enters airway, passes BELOW cords without attempt by patient to eject out (silent aspiration);Material enters airway, passes BELOW cords and not ejected out despite cough attempt by patient Pharyngeal- Thin Straw -- Pharyngeal -- Pharyngeal- Puree Reduced pharyngeal peristalsis;Reduced epiglottic inversion;Reduced anterior laryngeal mobility;Reduced laryngeal elevation;Reduced airway/laryngeal closure;Reduced tongue base retraction;Pharyngeal residue - valleculae;Pharyngeal residue - pyriform Pharyngeal -- Pharyngeal- Mechanical Soft -- Pharyngeal -- Pharyngeal- Regular -- Pharyngeal -- Pharyngeal- Multi-consistency -- Pharyngeal -- Pharyngeal- Pill -- Pharyngeal -- Pharyngeal Comment --  CHL IP CERVICAL ESOPHAGEAL PHASE 03/10/2019 Cervical Esophageal Phase  Impaired Pudding Teaspoon -- Pudding Cup -- Honey Teaspoon -- Honey Cup Reduced cricopharyngeal relaxation Nectar Teaspoon Reduced cricopharyngeal relaxation Nectar Cup Reduced cricopharyngeal relaxation Nectar Straw -- Thin Teaspoon Reduced cricopharyngeal relaxation Thin Cup Reduced cricopharyngeal relaxation Thin Straw -- Puree Reduced cricopharyngeal relaxation Mechanical Soft -- Regular -- Multi-consistency -- Pill -- Cervical Esophageal Comment -- Venita Sheffield Nix 03/10/2019, 10:37 AM  [name], M.A. CCC-SLP Acute Rehabilitation Services Pager [phone] Office [phone]              Medications / Allergies: per chart  Antibiotics: Anti-infectives (From admission, onward)   Start     Dose/Rate Route Frequency Ordered Stop   03/09/19 0035  ceFAZolin (ANCEF) 2-4 GM/100ML-% IVPB    Note to Pharmacy: Luis Abed   : cabinet override      03/09/19 0035 03/09/19 1244   03/07/19 2200  cefoTEtan (CEFOTAN) 2 g in sodium chloride 0.9 % 100 mL IVPB     2 g 200 mL/hr over 30 Minutes Intravenous Every 12 hours 03/07/19 1700 03/07/19 2210   03/07/19 1434  clindamycin (CLEOCIN) 900 mg, gentamicin (GARAMYCIN) 240 mg in sodium chloride 0.9 % 1,000 mL for intraperitoneal lavage  Status:  Discontinued  As needed 03/07/19 1434 03/07/19 1456   03/07/19 1400  neomycin (MYCIFRADIN) tablet 1,000 mg  Status:  Discontinued     1,000 mg Oral 3 times per day 03/07/19 1026 03/07/19 1641   03/07/19 1400  metroNIDAZOLE (FLAGYL) tablet 1,000 mg  Status:  Discontinued     1,000 mg Oral 3 times per day 03/07/19 1026 03/07/19 1641   03/07/19 1030  cefoTEtan (CEFOTAN) 2 g in sodium chloride 0.9 % 100 mL IVPB     2 g 200 mL/hr over 30 Minutes Intravenous On call to O.R. 03/07/19 1026 03/07/19 1228   03/07/19 0600  clindamycin (CLEOCIN) 900 mg, gentamicin (GARAMYCIN) 240 mg in sodium chloride 0.9 % 1,000 mL for intraperitoneal lavage  Status:  Discontinued      Irrigation To Surgery 03/06/19 1018 03/07/19 1641         Note: Portions of this report may have been transcribed using voice recognition software. Every effort was made to ensure accuracy; however, inadvertent computerized transcription errors may be present.   Any transcriptional errors that result from this process are unintentional.     [name], MD, FACS, MASCRS Gastrointestinal and Minimally Invasive Surgery    [address] [place], [place] [address] [phone] Main / Paging [phone] Fax

## 2019-03-11 NOTE — Progress Notes (Signed)
ANTICOAGULATION CONSULT NOTE   Pharmacy Consult for Heparin Indication: atrial fibrillation and stroke  No Known Allergies  Patient Measurements: Height: 5\' 8"  (172.7 cm) Weight: 135 lb 9.3 oz (61.5 kg) IBW/kg (Calculated) : 68.4 Heparin Dosing Weight: 63.8 kg  Vital Signs: Temp: 98.1 F (36.7 C) (08/04 0400) Temp Source: Oral (08/04 0400) BP: 120/70 (08/04 0700) Pulse Rate: 60 (08/04 0715)  Labs: Recent Labs    03/09/19 0215  03/09/19 0656  03/10/19 0545 03/10/19 1235 03/10/19 2034 03/11/19 0809  HGB  --    < > 12.5*  --  10.8*  --   --  9.5*  HCT  --   --  36.5*  --  31.2*  --   --  27.5*  PLT  --   --  125*  --  140*  --   --  146*  APTT >200*  --   --    < > 51* 25 88*  --   LABPROT 15.7*  --   --   --   --   --   --   --   INR 1.3*  --   --   --   --   --   --   --   HEPARINUNFRC  --   --   --    < > <0.10* <0.10* 0.28* 0.31  CREATININE  --   --  0.70  --   --   --   --   --    < > = values in this interval not displayed.    Estimated Creatinine Clearance: 82.2 mL/min (by C-G formula based on SCr of 0.7 mg/dL).   Medical History: Past Medical History:  Diagnosis Date  . AF (paroxysmal atrial fibrillation) ([place]) 11/29/2018  . Atrial flutter ([place]) 10/12/2015  . CAD (coronary artery disease), native coronary artery 12/06/2015   a. 10/2015 MV: EF  37%, reversible defect inferior apex, intermediate risk findings; b. 10/2015 Cath: 20% mid RCA.  . Colonic diverticular abscess   . Diverticulitis   . History of chemotherapy 2005   Cisplatin  . Hypothyroidism   . NICM (nonischemic cardiomyopathy) ([place]) 10/14/2015   a. Tachy mediated?;  b. Echo 3/17 - Mild concentric LVH, EF 30-35%, anteroseptal, anterior, anterolateral, apical anterior, lateral hypokinesis, trivial MR, mild to moderately reduced RVSF; c. LHC 3/17 - mRCA 20%  . Paroxysmal atrial flutter ([place])    a. TEE 3/17 with ? LAA clot-->s/p TEE/DCCV 11/24/2015  . Radiation NOv.3,2005-Dec. 15, 2005   6810 cGy in 30  fractions  . 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  XRT    Assessment: 63 year old male on Eliquis prior to admission for atrial fibrillation. Patient was admitted to [facility] on 7/31 for robot assisted low anterior resection and appendectomy and Eliquis had been held for surgery - last dose on 7/28. POD#1 patient began to experience stroke symptoms and L-MCA stroke noted on CT. Patient was transferred to [facility] for IR intervention. Attempted mechanical thrombectomy was unsuccessful and carotid artery was not amenable to stent. Pharmacy consulted to dose heparin.   Heparin level 0.31 this am. No bleeding noted, no rate changes overnight.  Given lower goal will only make small heparin rate adjustment and follow up with am labs.   Goal of Therapy:  Heparin level 0.3 to 0.5 units/ml aPTT 66-85 seconds Monitor platelets by anticoagulation protocol: Yes   Plan:  Continue Heparin at 1350 units/hr Check heparin level in am Monitor  for bleeding and mental status changes.   Alanda Slim, PharmD, [facility] Clinical Pharmacist Please see AMION for all Pharmacists' Contact Phone Numbers 03/11/2019, 9:46 AM

## 2019-03-12 ENCOUNTER — Inpatient Hospital Stay (HOSPITAL_COMMUNITY): Payer: BC Managed Care – PPO

## 2019-03-12 ENCOUNTER — Encounter (HOSPITAL_COMMUNITY)
Admission: RE | Disposition: A | Discharge status: Home / Self Care | Payer: Self-pay | Source: Home / Self Care | Attending: Surgery

## 2019-03-12 ENCOUNTER — Encounter (HOSPITAL_COMMUNITY): Payer: BC Managed Care – PPO

## 2019-03-12 ENCOUNTER — Encounter (HOSPITAL_COMMUNITY): Payer: Self-pay | Admitting: Vascular Surgery

## 2019-03-12 DIAGNOSIS — I724 Aneurysm of artery of lower extremity: Secondary | ICD-10-CM

## 2019-03-12 DIAGNOSIS — E876 Hypokalemia: Secondary | ICD-10-CM

## 2019-03-12 LAB — BASIC METABOLIC PANEL
Anion gap: 9 (ref 5–15)
BUN: 6 mg/dL — ABNORMAL LOW (ref 8–23)
CO2: 22 mmol/L (ref 22–32)
Calcium: 8 mg/dL — ABNORMAL LOW (ref 8.9–10.3)
Chloride: 106 mmol/L (ref 98–111)
Creatinine, Ser: 0.47 mg/dL — ABNORMAL LOW (ref 0.61–1.24)
GFR calc Af Amer: >60 mL/min (ref >60)
GFR calc non Af Amer: >60 mL/min (ref >60)
Glucose, Bld: 130 mg/dL — ABNORMAL HIGH (ref 70–99)
Potassium: 3.3 mmol/L — ABNORMAL LOW (ref 3.5–5.1)
Sodium: 137 mmol/L (ref 135–145)

## 2019-03-12 LAB — CBC
HCT: 26.7 % — ABNORMAL LOW (ref 39.0–52.0)
Hemoglobin: 9.4 g/dL — ABNORMAL LOW (ref 13.0–17.0)
MCH: 31.1 pg (ref 26.0–34.0)
MCHC: 35.2 g/dL (ref 30.0–36.0)
MCV: 88.4 fL (ref 80.0–100.0)
Platelets: 152 10*3/uL (ref 150–400)
RBC: 3.02 MIL/uL — ABNORMAL LOW (ref 4.22–5.81)
RDW: 15.4 % (ref 11.5–15.5)
WBC: 3.8 10*3/uL — ABNORMAL LOW (ref 4.0–10.5)
nRBC: 0.5 % — ABNORMAL HIGH (ref 0.0–0.2)

## 2019-03-12 LAB — HEPARIN LEVEL (UNFRACTIONATED)
Heparin Unfractionated: 0.24 IU/mL — ABNORMAL LOW (ref 0.30–0.70)
Heparin Unfractionated: 0.16 IU/mL — ABNORMAL LOW (ref 0.30–0.70)

## 2019-03-12 LAB — MAGNESIUM: Magnesium: 1.8 mg/dL (ref 1.7–2.4)

## 2019-03-12 SURGERY — INVASIVE LAB ABORTED CASE

## 2019-03-12 MED ORDER — POTASSIUM CHLORIDE 20 MEQ PO PACK
40.0000 meq | PACK | Freq: Once | ORAL | Status: AC
  Administered 2019-03-12: 40 meq via ORAL
  Filled 2019-03-12: qty 2

## 2019-03-12 MED ORDER — ORAL CARE MOUTH RINSE
15.0000 mL | Freq: Two times a day (BID) | OROMUCOSAL | Status: DC
  Administered 2019-03-12 – 2019-03-16 (×7): 15 mL via OROMUCOSAL

## 2019-03-12 MED ORDER — CHLORHEXIDINE GLUCONATE 0.12 % MT SOLN
15.0000 mL | Freq: Two times a day (BID) | OROMUCOSAL | Status: DC
  Administered 2019-03-13 – 2019-03-16 (×7): 15 mL via OROMUCOSAL
  Filled 2019-03-12 (×5): qty 15

## 2019-03-12 MED ORDER — POTASSIUM CHLORIDE 10 MEQ/100ML IV SOLN
10.0000 meq | INTRAVENOUS | Status: DC
  Filled 2019-03-12 (×2): qty 100

## 2019-03-12 MED ORDER — THROMBIN FOR PERCUTANEOUS TREATMENT OF PSEUDOANEURYSM (5000UNITS/10ML)
Freq: Once | PERCUTANEOUS | Status: AC
  Administered 2019-03-12: 14:00:00 via PERCUTANEOUS
  Filled 2019-03-12: qty 1

## 2019-03-12 NOTE — Progress Notes (Signed)
1300 DR. EARLY AND DR. CLARK PRESENT TO PERFORM THROMBIN INJECTION TO RIGHT GROIN. ULTRASOUND PRESENT TO ASSIST.

## 2019-03-12 NOTE — Op Note (Signed)
Date: March 12, 2019  Preoperative diagnosis: Right common femoral artery pseudoaneurysm  Postoperative diagnosis: Same  Procedure: 1.  Ultrasound-guided thrombin injection of right common femoral artery pseudoaneurysm  Surgeon: Dr. Marty Heck and Dr. Sherren Mocha Early  Indications: Patient is a 63 year old male who underwent right common femoral artery access this past weekend with interventional neuroradiology after he presented with an acute left MCA infarct embolic while off anticoagulation.  Ultimately he has been on heparin for his atrial fibrillation since the procedure.  He was noted to have a pulsatile right groin mass and duplex showed a complex bilobed pseudoaneurysm in the right groin.  He presents for thrombin injection of the right groin pseudoaneurysm after risk benefits were discussed.  Findings: Right common femoral artery bilobed pseudoaneurysm with fairly short neck.  After thrombin injection there was no flow in the pseudoaneurysm.  There was preserved triphasic flow in the common femoral SFA profunda and patient had a palpable posterior tibial pulse int the right foot.  Details: Patient was taken to the Cath Lab holding.  He was placed supine on a stretcher.  His right groin was then prepped and draped in usual sterile fashion.  A preop timeout was performed to identify patient, procedure and site.  The vascular lab was available with sterile ultrasound probe and the pseudoaneurysm was evaluated and noted to be bilobed as previously noted.  Ultimately a micro access needle was then used to sterilely access the pseudoaneurysm lobe that was closer to the common femoral artery while attempting to compress the neck of the pseudoaneurysm.  We then injected 3 mL of 5000 unit thrombin however during initial attempts at injection we did not have any pulsatile backflow and the pseudoaneurysm still had flow.   Upon further manipulation of the micro access needle under ultrasound we were  able to get in the pseudoaneurysm with backbleeding and then injected another 1 mL of 5000 unit thrombin (4 mL of total volume) with thrombosis of the pseudoaneurysm on doppler.  The needle was subsequently removed.  Patient tolerated the procedure without any complications.  Ultrasound was then used to confirm flow down the common femoral, profunda, and SFA at the completion of the case.  Again checked the groin pseudoaneurysm site and no flow with residual hematoma.    Complication: None  Condition: Stable  Marty Heck, MD [facility] Office: [phone] Pager: [place]

## 2019-03-12 NOTE — Progress Notes (Addendum)
Adrian [name] [id number] Jan 03, 1956  CARE TEAM:  PCP: [name], MD  Outpatient Care Team: Patient Care Team: [name], MD as PCP - General (Family Medicine) Sueanne Margarita, MD as PCP - Cardiology (Cardiology) Sueanne Margarita, MD as Consulting Physician (Cardiology) Michael Boston, MD as Consulting Physician (General Surgery) Irene Shipper, MD as Consulting Physician (Gastroenterology) Izora Gala, MD as Consulting Physician (Otolaryngology) Garvin Fila, MD as Consulting Physician (Neurology)  Inpatient Treatment Team: Treatment Team: Attending Provider: Michael Boston, MD; Rounding Team: Stroke, Md, MD; Registered Nurse: Lubertha South, RN; Registered Nurse: Fortino Sic, RN; Technician: Wylene Men, [place]; Consulting Physician: Michael Boston, MD; Case Manager: Reinaldo Raddle, RN; Consulting Physician: Edison Pace, Md, MD; Occupational Therapist: Peri Maris, OT; Utilization Review: Sindy Guadeloupe, RN; Physical Therapist: Daylene Katayama, PT   Problem List:   Principal Problem:   Middle cerebral artery embolism, left Active Problems:   Expressive aphasia   NICM (nonischemic cardiomyopathy) ([place])   Diverticulitis of large intestine with abscess   History of radiation therapy   Smokes tobacco daily   Hypothyroidism   CAD (coronary artery disease), native coronary artery   Chronic atrial fibrillation   Current use of long term anticoagulation   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 MCA revascularization via collaterals TICI 2b to 2C .  Assessment  Stabilizing status post stroke cerebral artery with expressive aphasia.  Neurology, Neuroradiology, ICU, RN, Code Stroke teams help appreciated  [facility] Stay = 5 days)  Plan:  -Continue rehab in the hopes that aphasia and dysphasia will continue to improve this hospitalization.    --pathology -consistent with diverticulitis.  Benign.  Discussed with patient. OK to d/c o/w from surgery standpoint  -bowel functioning - OK to adv diet as tolerated to solids once speech Tx determines safest course.  Assuming Dys1 to Dys3 evetually  -full gtt anticoagulation  -right groin hematoma stable - ice.  Activity per neuroradiology  -Expressive aphasia persists but slightly improving. - agree w speech therapy, PT/OT, anticoagulation  -hypokalemia - replace  -VTE prophylaxis- SCDs, etc  -mobilize as tolerated to help recovery  20 minutes spent in review, evaluation, examination, counseling, and coordination of care.  More than 50% of that time was spent in counseling.  03/12/2019    Subjective: (Chief complaint)  CorPak placed.  On tube feeds.  Seems more alert.  Right groin hematoma - stable from marking but no bedridden  Objective:  Vital signs:  Vitals:   03/12/19 0300 03/12/19 0400 03/12/19 0500 03/12/19 0600  BP: (!) 123/58 130/77 (!) 134/59 124/68  Pulse: 62  (!) 56 (!) 56  Resp: (!) 21 11 15 13   Temp:  98.7 F (37.1 C)    TempSrc:  Oral    SpO2: 93%  100% 99%  Weight:      Height:        Last BM Date: 03/11/19  Intake/Output   Yesterday:  08/04 0701 - 08/05 0700 In: 3068 [I.V.:2313; NG/GT:755] Out: 2774 [Urine:1375] This shift:  Total I/O In: 1228.1 [I.V.:853.1; NG/GT:375] Out: 9024 [Urine:1375]  Bowel function:  Flatus: YES  BM:  YES  Drain: (No drain)   Physical Exam:  General: Pt awake/alert/oriented x4 in  no acute distress Eyes: PERRL, normal EOM.  Sclera clear.  No icterus  Neuro: CN II-XII intact w/o focal sensory/motor deficits.  Expressive aphasia minimal to me - answering better w little pausing.  Lymph: No head/neck/groin lymphadenopathy Psych:  No delerium/psychosis/paranoia HENT: Normocephalic, Mucus membranes moist.  No thrush Neck: Supple, No tracheal deviation Chest: No chest wall pain w good excursion CV:  Pulses intact.  Regular rhythm MS: Normal AROM mjr joints.  No obvious deformity  Abdomen: Soft.  Nondistended.  Nontender.  Incisions w normal healing ridges. Nontender. No cellulitis or abscess.  No evidence of peritonitis.  No incarcerated hernias.  Right groin 4x4cm mass c/w hematoma - no oozing/bleeding Ext:   No deformity.  No mjr edema.  No cyanosis Skin: No petechiae / purpura  Results:   Cultures: Recent Results (from the past 720 hour(s))  SARS Coronavirus 2 (Performed in [place] hospital lab)     Status: None   Collection Time: 03/04/19  9:47 AM   Specimen: Nasal Swab  Result Value Ref Range Status   SARS Coronavirus 2 NEGATIVE NEGATIVE Final    Comment: (NOTE) SARS-CoV-2 target nucleic acids are NOT DETECTED. The SARS-CoV-2 RNA is generally detectable in upper and lower respiratory specimens during the acute phase of infection. Negative results do not preclude SARS-CoV-2 infection, do not rule out co-infections with other pathogens, and should not be used as the sole basis for treatment or other patient management decisions. Negative results must be combined with clinical observations, patient history, and epidemiological information. The expected result is Negative. Fact Sheet for Patients: SugarRoll.be Fact Sheet for Healthcare Providers: https://www.woods-mathews.com/ This test is not yet approved or cleared by the [place] FDA and  has been authorized for detection and/or diagnosis of SARS-CoV-2  by FDA under an Emergency Use Authorization (EUA). This EUA will remain  in effect (meaning this test can be used) for the duration of the COVID-19 declaration under Section 56 4(b)(1) of the Act, 21 U.S.C. section 360bbb-3(b)(1), unless the authorization is terminated or revoked sooner. Performed at [facility], [place] [address] [place], [place] [address]   MRSA PCR Screening     Status: None   Collection Time: 03/08/19 10:44 PM   Specimen: Nasal Mucosa; Nasopharyngeal  Result Value Ref Range Status   MRSA by PCR NEGATIVE NEGATIVE Final    Comment:        The GeneXpert MRSA Assay (FDA approved for NASAL specimens only), is one component of a comprehensive MRSA colonization surveillance program. It is not intended to diagnose MRSA infection nor to guide or monitor treatment for MRSA infections. Performed at [facility], [place] [address], [place], [place] [address]     Labs: Results for orders placed or performed during the hospital encounter of 03/07/19 (from the past 48 hour(s))  Heparin level (unfractionated)     Status: Abnormal   Collection Time: 03/10/19 12:35 PM  Result Value Ref Range   Heparin Unfractionated <0.10 (L) 0.30 - 0.70 IU/mL    Comment: (NOTE) If heparin results are below expected values, and patient dosage has  been confirmed, suggest follow up testing of antithrombin III levels. Performed at [facility], [place] [address], [place], [place] [address]   APTT     Status: None  Collection Time: 03/10/19 12:35 PM  Result Value Ref Range   aPTT 25 24 - 36 seconds    Comment: Performed at [facility], [place] [address], [place], [place] [address]  Heparin level (unfractionated)     Status: Abnormal   Collection Time: 03/10/19  8:34 PM  Result Value Ref Range   Heparin Unfractionated 0.28 (L) 0.30 - 0.70 IU/mL    Comment: (NOTE) If heparin results are below expected values, and patient dosage has  been confirmed, suggest follow up  testing of antithrombin III levels. Performed at [facility], [place] [address] [place], [place] [address]   APTT     Status: Abnormal   Collection Time: 03/10/19  8:34 PM  Result Value Ref Range   aPTT 88 (H) 24 - 36 seconds    Comment:        IF BASELINE aPTT IS ELEVATED, SUGGEST PATIENT RISK ASSESSMENT BE USED TO DETERMINE APPROPRIATE ANTICOAGULANT THERAPY. Performed at [facility], [place] [address] [place], [place] [address]   CBC     Status: Abnormal   Collection Time: 03/11/19  8:09 AM  Result Value Ref Range   WBC 4.6 4.0 - 10.5 K/uL   RBC 3.10 (L) 4.22 - 5.81 MIL/uL   Hemoglobin 9.5 (L) 13.0 - 17.0 g/dL   HCT 27.5 (L) 39.0 - 52.0 %   MCV 88.7 80.0 - 100.0 fL   MCH 30.6 26.0 - 34.0 pg   MCHC 34.5 30.0 - 36.0 g/dL   RDW 15.7 (H) 11.5 - 15.5 %   Platelets 146 (L) 150 - 400 K/uL   nRBC 0.0 0.0 - 0.2 %    Comment: Performed at [facility], [place] [address] [place], [place] [address]  Heparin level (unfractionated)     Status: None   Collection Time: 03/11/19  8:09 AM  Result Value Ref Range   Heparin Unfractionated 0.31 0.30 - 0.70 IU/mL    Comment: (NOTE) If heparin results are below expected values, and patient dosage has  been confirmed, suggest follow up testing of antithrombin III levels. Performed at [facility], [place] [address] [place], [place] [address]   CBC     Status: Abnormal   Collection Time: 03/12/19  3:21 AM  Result Value Ref Range   WBC 3.8 (L) 4.0 - 10.5 K/uL   RBC 3.02 (L) 4.22 - 5.81 MIL/uL   Hemoglobin 9.4 (L) 13.0 - 17.0 g/dL   HCT 26.7 (L) 39.0 - 52.0 %   MCV 88.4 80.0 - 100.0 fL   MCH 31.1 26.0 - 34.0 pg   MCHC 35.2 30.0 - 36.0 g/dL   RDW 15.4 11.5 - 15.5 %   Platelets 152 150 - 400 K/uL   nRBC 0.5 (H) 0.0 - 0.2 %    Comment: Performed at [facility], [place] [address] [place], [place] [address]  Heparin level (unfractionated)     Status: Abnormal   Collection Time: 03/12/19  3:21 AM  Result Value Ref Range    Heparin Unfractionated 0.24 (L) 0.30 - 0.70 IU/mL    Comment: (NOTE) If heparin results are below expected values, and patient dosage has  been confirmed, suggest follow up testing of antithrombin III levels. Performed at [facility], [place] [address] [place], [place] [address]   Basic metabolic panel     Status: Abnormal   Collection Time: 03/12/19  3:21 AM  Result Value Ref Range  Sodium 137 135 - 145 mmol/L   Potassium 3.3 (L) 3.5 - 5.1 mmol/L   Chloride 106 98 - 111 mmol/L   CO2 22 22 - 32 mmol/L   Glucose, Bld 130 (H) 70 - 99 mg/dL   BUN 6 (L) 8 - 23 mg/dL   Creatinine, Ser 0.47 (L) 0.61 - 1.24 mg/dL   Calcium 8.0 (L) 8.9 - 10.3 mg/dL   GFR calc non Af Amer >60 >60 mL/min   GFR calc Af Amer >60 >60 mL/min   Anion gap 9 5 - 15    Comment: Performed at [place] [address] [place], [place] [address]    Imaging / Studies: Dg Swallowing Func-speech Pathology  Result Date: 03/10/2019 Objective Swallowing Evaluation: Type of Study: MBS-Modified Barium Swallow Study  Patient Details Name: Adrian [name] MRN: [id number] Date of Birth: May 24, 1956 Today's Date: 03/10/2019 Time: SLP Start Time (ACUTE ONLY): 0907 -SLP Stop Time (ACUTE ONLY): 0926 SLP Time Calculation (min) (ACUTE ONLY): 19 min Past Medical History: Past Medical History: Diagnosis Date  AF (paroxysmal atrial fibrillation) ([place]) 11/29/2018  Atrial flutter ([place]) 10/12/2015  CAD (coronary artery disease), native coronary artery 12/06/2015  a. 10/2015 MV: EF  37%, reversible defect inferior apex, intermediate risk findings; b. 10/2015 Cath: 20% mid RCA.  Colonic diverticular abscess   Diverticulitis   History of chemotherapy 2005  Cisplatin  Hypothyroidism   NICM (nonischemic cardiomyopathy) ([place]) 10/14/2015  a. Tachy mediated?;  b. Echo 3/17 - Mild concentric LVH, EF 30-35%, anteroseptal, anterior, anterolateral, apical anterior, lateral hypokinesis, trivial MR, mild to moderately reduced RVSF; c. LHC 3/17 - mRCA 20%   Paroxysmal atrial flutter ([place])   a. TEE 3/17 with ? LAA clot-->s/p TEE/DCCV 11/24/2015  Radiation NOv.3,2005-Dec. 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  XRT Past Surgical History: Past Surgical History: Procedure Laterality Date  APPENDECTOMY N/A 03/07/2019  Procedure: ROBOTIC ASSISTED APPENDECTOMY;  Surgeon: Michael Boston, MD;  Location: [facility];  Service: General;  Laterality: N/A;  CARDIAC CATHETERIZATION N/A 10/18/2015  Procedure: Left Heart Cath and Coronary Angiography;  Surgeon: Burnell Blanks, MD;  Location: [facility];  Service: Cardiovascular;  Laterality: N/A;  CARDIOVERSION N/A 11/24/2015  Procedure: CARDIOVERSION;  Surgeon: Josue Hector, MD;  Location: [facility];  Service: Cardiovascular;  Laterality: N/A;  CYSTOSCOPY WITH STENT PLACEMENT Bilateral 03/07/2019  Procedure: CYSTOSCOPY WITH BILATERAL FIREFLY INJECTION;  Surgeon: Ardis Hughs, MD;  Location: [facility];  Service: Urology;  Laterality: Bilateral;  GASTROSTOMY TUBE PLACEMENT  07/04/2004  IR - G tube for tonsilar cancer  IR RADIOLOGIST EVAL & MGMT  12/18/2018  LAMINECTOMY    C5/placement of steel plate  NECK SURGERY  2003  replaced disk  TEE WITHOUT CARDIOVERSION N/A 10/15/2015  Procedure: TRANSESOPHAGEAL ECHOCARDIOGRAM (TEE);  Surgeon: Larey Dresser, MD;  Location: [facility];  Service: Cardiovascular;  Laterality: N/A;  TEE WITHOUT CARDIOVERSION N/A 11/24/2015  Procedure: TRANSESOPHAGEAL ECHOCARDIOGRAM (TEE);  Surgeon: Josue Hector, MD;  Location: [facility];  Service: Cardiovascular;  Laterality: N/A;  XI ROBOTIC ASSISTED COLOSTOMY TAKEDOWN N/A 03/07/2019  Procedure: XI ROBOTIC ASSISTED LOW ANTERIOR RESECTION, RIGID PROCTOSCOPY;  Surgeon: Michael Boston, MD;  Location: [facility];  Service: General;  Laterality: N/A; HPI: Adrian [name] is a 63 y.o. male coronary artery disease, tonsillar ca in remission, atrial fibrillation on anticoagulation which was held for surgery  since last Tuesday, transferred emergently from [facility] hospital where he was  admitted for diverticulitis of the large intestine with abscess requiring exploratory laparotomy, low anterior resection rigid proctoscopy, appendectomy, last seen normal at 7:30 PM on 03/08/2019 at [facility] when it was noticed that he had difficulty with speech- speech did not make sense. CTA head and neck was also done which revealed a left cervical carotid occlusion as well as left MCA occlusion. S/P bilateral common carotid and RT vertbral arteriograms followed by attempted revascularization of occluded Lt ICA prox .MCA revascularization via collaterals TICI 2b to 2C . MRI shows Restricted diffusion in the left MCA middle to anterior division territory involving most of the insula, left operculum  Subjective: pt alert, aphasic Assessment / Plan / Recommendation CHL IP CLINICAL [name] 03/10/2019 Clinical Impression Pt has a moderate to severe oropharyngeal dysphagia that is suspected to be acute on chronic given new stroke in addition to prior ACDF and XRT for tonsillar cancer. His oral phase is mildly prolonged, more so as boluses become thicker and mor solid. Bolus cohesion is reduced and spills backward into the pharynx not as a formed bolus, at times using a posterior head tilt to initiate posterior propulsion (making chin tuck a difficult strategy to use). Pharyngeally he exhibits reduced pharyngeal squeeze, base of tongue retraction, and hyolaryngeal movement. Epiglottic inversion and airway closure are reduced. He often penetrates during the swallow as a result. More significantly, he also has frequent aspiration before the swallow with thin liquids (a combined result of oral and pharyngeal impairments) and after the swallow with all consistencies. Moderate residue primarily at the valleculae with thin liquids increases up to more severe residue also at the pyriform sinuses with limited entrance into the UES with  purees. Varying volume and bolus presentation did not increase airway protection or efficiency of swallow. Most aspiration goes unsensed, and his cued cough and intermittent spontaneous are only able to partially clear the airway. Recommend that he remain NPO with consideration of temporary, alternative means of nutrition. Could also provided a few single ice chips after oral care to utilize swallowing musculature and facilitate secretion management. Will f/u for exercises to maximize swallow and cough as well as completion of speech-language evaluation.  SLP Visit Diagnosis Dysphagia, oropharyngeal phase (R13.12) Attention and concentration deficit following -- Frontal lobe and executive function deficit following -- Impact on safety and function Severe aspiration risk   CHL IP TREATMENT RECOMMENDATION 03/10/2019 Treatment Recommendations Therapy as outlined in treatment plan below   Prognosis 03/10/2019 Prognosis for Safe Diet Advancement Good Barriers to Reach Goals Severity of deficits Barriers/Prognosis Comment -- CHL IP DIET RECOMMENDATION 03/10/2019 SLP Diet Recommendations NPO;Alternative means - temporary Liquid Administration via -- Medication Administration Via alternative means Compensations -- Postural Changes --   CHL IP OTHER RECOMMENDATIONS 03/10/2019 Recommended Consults -- Oral Care Recommendations Oral care QID Other Recommendations Have oral suction available   CHL IP FOLLOW UP RECOMMENDATIONS 03/10/2019 Follow up Recommendations Inpatient Rehab   CHL IP FREQUENCY AND DURATION 03/10/2019 Speech Therapy Frequency (ACUTE ONLY) min 2x/week Treatment Duration 2 weeks      CHL IP ORAL PHASE 03/10/2019 Oral Phase Impaired Oral - Pudding Teaspoon -- Oral - Pudding Cup -- Oral - Honey Teaspoon -- Oral - Honey Cup Reduced posterior propulsion;Decreased bolus cohesion Oral - Nectar Teaspoon Reduced posterior propulsion;Decreased bolus cohesion Oral - Nectar Cup Reduced posterior propulsion;Decreased bolus cohesion Oral  - Nectar Straw -- Oral - Thin Teaspoon Reduced posterior propulsion;Decreased bolus cohesion Oral - Thin Cup Reduced posterior propulsion;Decreased bolus cohesion Oral -  Thin Straw -- Oral - Puree Reduced posterior propulsion;Delayed oral transit;Decreased bolus cohesion Oral - Mech Soft -- Oral - Regular -- Oral - Multi-Consistency -- Oral - Pill -- Oral Phase - Comment --  CHL IP PHARYNGEAL PHASE 03/10/2019 Pharyngeal Phase -- Pharyngeal- Pudding Teaspoon -- Pharyngeal -- Pharyngeal- Pudding Cup -- Pharyngeal -- Pharyngeal- Honey Teaspoon -- Pharyngeal -- Pharyngeal- Honey Cup Reduced pharyngeal peristalsis;Reduced epiglottic inversion;Reduced anterior laryngeal mobility;Reduced laryngeal elevation;Reduced airway/laryngeal closure;Reduced tongue base retraction;Pharyngeal residue - valleculae;Pharyngeal residue - pyriform;Penetration/Apiration after swallow Pharyngeal Material enters airway, passes BELOW cords without attempt by patient to eject out (silent aspiration) Pharyngeal- Nectar Teaspoon Reduced pharyngeal peristalsis;Reduced epiglottic inversion;Reduced anterior laryngeal mobility;Reduced laryngeal elevation;Reduced airway/laryngeal closure;Reduced tongue base retraction;Pharyngeal residue - valleculae;Penetration/Apiration after swallow;Pharyngeal residue - pyriform Pharyngeal Material enters airway, passes BELOW cords without attempt by patient to eject out (silent aspiration) Pharyngeal- Nectar Cup Reduced pharyngeal peristalsis;Reduced epiglottic inversion;Reduced anterior laryngeal mobility;Reduced laryngeal elevation;Reduced airway/laryngeal closure;Reduced tongue base retraction;Pharyngeal residue - valleculae;Penetration/Apiration after swallow;Pharyngeal residue - pyriform Pharyngeal Material enters airway, passes BELOW cords without attempt by patient to eject out (silent aspiration) Pharyngeal- Nectar Straw -- Pharyngeal -- Pharyngeal- Thin Teaspoon Reduced pharyngeal peristalsis;Reduced  epiglottic inversion;Reduced anterior laryngeal mobility;Reduced laryngeal elevation;Reduced airway/laryngeal closure;Reduced tongue base retraction;Pharyngeal residue - valleculae Pharyngeal -- Pharyngeal- Thin Cup Reduced pharyngeal peristalsis;Reduced epiglottic inversion;Reduced anterior laryngeal mobility;Reduced laryngeal elevation;Reduced airway/laryngeal closure;Reduced tongue base retraction;Pharyngeal residue - valleculae;Penetration/Aspiration before swallow;Penetration/Apiration after swallow;Pharyngeal residue - pyriform Pharyngeal Material enters airway, passes BELOW cords without attempt by patient to eject out (silent aspiration);Material enters airway, passes BELOW cords and not ejected out despite cough attempt by patient Pharyngeal- Thin Straw -- Pharyngeal -- Pharyngeal- Puree Reduced pharyngeal peristalsis;Reduced epiglottic inversion;Reduced anterior laryngeal mobility;Reduced laryngeal elevation;Reduced airway/laryngeal closure;Reduced tongue base retraction;Pharyngeal residue - valleculae;Pharyngeal residue - pyriform Pharyngeal -- Pharyngeal- Mechanical Soft -- Pharyngeal -- Pharyngeal- Regular -- Pharyngeal -- Pharyngeal- Multi-consistency -- Pharyngeal -- Pharyngeal- Pill -- Pharyngeal -- Pharyngeal Comment --  CHL IP CERVICAL ESOPHAGEAL PHASE 03/10/2019 Cervical Esophageal Phase Impaired Pudding Teaspoon -- Pudding Cup -- Honey Teaspoon -- Honey Cup Reduced cricopharyngeal relaxation Nectar Teaspoon Reduced cricopharyngeal relaxation Nectar Cup Reduced cricopharyngeal relaxation Nectar Straw -- Thin Teaspoon Reduced cricopharyngeal relaxation Thin Cup Reduced cricopharyngeal relaxation Thin Straw -- Puree Reduced cricopharyngeal relaxation Mechanical Soft -- Regular -- Multi-consistency -- Pill -- Cervical Esophageal Comment -- Venita Sheffield Nix 03/10/2019, 10:37 AM  [name], M.A. CCC-SLP Acute Rehabilitation Services Pager [phone] Office [phone]              Medications /  Allergies: per chart  Antibiotics: Anti-infectives (From admission, onward)   Start     Dose/Rate Route Frequency Ordered Stop   03/09/19 0035  ceFAZolin (ANCEF) 2-4 GM/100ML-% IVPB    Note to Pharmacy: Luis Abed   : cabinet override      03/09/19 0035 03/09/19 1244   03/07/19 2200  cefoTEtan (CEFOTAN) 2 g in sodium chloride 0.9 % 100 mL IVPB     2 g 200 mL/hr over 30 Minutes Intravenous Every 12 hours 03/07/19 1700 03/07/19 2210   03/07/19 1434  clindamycin (CLEOCIN) 900 mg, gentamicin (GARAMYCIN) 240 mg in sodium chloride 0.9 % 1,000 mL for intraperitoneal lavage  Status:  Discontinued       As needed 03/07/19 1434 03/07/19 1456   03/07/19 1400  neomycin (MYCIFRADIN) tablet 1,000 mg  Status:  Discontinued     1,000 mg Oral 3 times per day 03/07/19 1026 03/07/19 1641   03/07/19 1400  metroNIDAZOLE (FLAGYL) tablet 1,000 mg  Status:  Discontinued     1,000 mg Oral 3 times per day 03/07/19 1026 03/07/19 1641   03/07/19 1030  cefoTEtan (CEFOTAN) 2 g in sodium chloride 0.9 % 100 mL IVPB     2 g 200 mL/hr over 30 Minutes Intravenous On call to O.R. 03/07/19 1026 03/07/19 1228   03/07/19 0600  clindamycin (CLEOCIN) 900 mg, gentamicin (GARAMYCIN) 240 mg in sodium chloride 0.9 % 1,000 mL for intraperitoneal lavage  Status:  Discontinued      Irrigation To Surgery 03/06/19 1018 03/07/19 1641        Note: Portions of this report may have been transcribed using voice recognition software. Every effort was made to ensure accuracy; however, inadvertent computerized transcription errors may be present.   Any transcriptional errors that result from this process are unintentional.     [name], MD, FACS, MASCRS Gastrointestinal and Minimally Invasive Surgery    [address], [place], [place] [address] [phone] Main / Paging [phone] Fax

## 2019-03-12 NOTE — Progress Notes (Signed)
ANTICOAGULATION CONSULT NOTE   Pharmacy Consult for Heparin Indication: atrial fibrillation and stroke  No Known Allergies  Patient Measurements: Height: 5\' 8"  (172.7 cm) Weight: 135 lb 9.3 oz (61.5 kg) IBW/kg (Calculated) : 68.4 Heparin Dosing Weight: 63.8 kg  Vital Signs: Temp: 98.5 F (36.9 C) (08/05 0800) Temp Source: Oral (08/05 0800) BP: 124/67 (08/05 0800) Pulse Rate: 63 (08/05 0800)  Labs: Recent Labs    03/10/19 0545 03/10/19 1235 03/10/19 2034 03/11/19 0809 03/12/19 0321  HGB 10.8*  --   --  9.5* 9.4*  HCT 31.2*  --   --  27.5* 26.7*  PLT 140*  --   --  146* 152  APTT 51* 25 88*  --   --   HEPARINUNFRC <0.10* <0.10* 0.28* 0.31 0.24*  CREATININE  --   --   --   --  0.47*    Estimated Creatinine Clearance: 82.2 mL/min (A) (by C-G formula based on SCr of 0.47 mg/dL (L)).  Assessment: 63 year old male on Eliquis prior to admission for atrial fibrillation. Patient was admitted to [facility] on 7/31 for robot assisted low anterior resection and appendectomy and Eliquis had been held for surgery - last dose on 7/28. POD#1 patient began to experience stroke symptoms and L-MCA stroke noted on CT. Patient was transferred to [facility] for IR intervention. Attempted mechanical thrombectomy was unsuccessful and carotid artery was not amenable to stent.    Heparin level low this am 0.24  CBC stable  Goal of Therapy:  Heparin level 0.3 to 0.5 units/ml Monitor platelets by anticoagulation protocol: Yes   Plan:  Increase heparin to 1450 units/hr Next lvl 1700 Daily HL CBC  Levester Fresh, PharmD, BCPS, BCCCP Clinical Pharmacist [phone]  Please check AMION for all [place] numbers  03/12/2019 8:39 AM

## 2019-03-12 NOTE — Progress Notes (Signed)
STROKE TEAM PROGRESS NOTE   SUBJECTIVE (INTERVAL HISTORY)  Patient has developed right groin swelling and hematoma.  He is getting ultrasound to look for pseudoaneurysm.  Neurologically he is otherwise stable.  Is able to speak short sentences and few words but does have paraphasic errors and non-fluent speech.  He still has dysphagia and has a Cortrak tube for feeding  OBJECTIVE Vitals:   03/12/19 0500 03/12/19 0600 03/12/19 0700 03/12/19 0800  BP: (!) 134/59 124/68 120/63 124/67  Pulse: (!) 56 (!) 56 (!) 55 63  Resp: 15 13 (!) 25 19  Temp:      TempSrc:      SpO2: 100% 99% 98% 98%  Weight:      Height:        CBC:  Recent Labs  Lab 03/09/19 0656  03/11/19 0809 03/12/19 0321  WBC 5.0   < > 4.6 3.8*  NEUTROABS 0.9*  --   --   --   HGB 12.5*   < > 9.5* 9.4*  HCT 36.5*   < > 27.5* 26.7*  MCV 91.9   < > 88.7 88.4  PLT 125*   < > 146* 152   < > = values in this interval not displayed.    Basic Metabolic Panel:  Recent Labs  Lab 03/08/19 0906 03/09/19 0656 03/12/19 0321  NA 136 139 137  K 4.1 3.8 3.3*  CL 102 104 106  CO2 27 25 22   GLUCOSE 135* 98 130*  BUN 10 7* 6*  CREATININE 0.68 0.70 0.47*  CALCIUM 8.5* 8.4* 8.0*  MG 1.8  --   --     Lipid Panel:     Component Value Date/Time   CHOL 110 03/09/2019 0656   CHOL 159 03/09/2017 1032   TRIG 97 03/09/2019 0656   HDL 45 03/09/2019 0656   HDL 61 03/09/2017 1032   CHOLHDL 2.4 03/09/2019 0656   VLDL 19 03/09/2019 0656   LDLCALC 46 03/09/2019 0656   LDLCALC 66 03/09/2017 1032   HgbA1c:  Lab Results  Component Value Date   HGBA1C 5.5 03/09/2019    IMAGING Ct Head Code Stroke Wo Contrast 03/08/2019 1. Normal appearance of the brain itself. However, there is a definite hyperdense left M2 branch consistent with an acute embolus. Right frontal sinusitis.  2. ASPECTS is 10.   Ct Angio Head W Or Wo Contrast Ct Angio Neck W Or Wo Contrast 03/08/2019 Acute occlusion of the left ICA at its origin. Reconstitution  of the ICA by collaterals at the skull base level. Occluded left M2 branch.  Ct Cerebral Perfusion W Contrast 03/08/2019 Acute core infarct involving the anterior left MCA distribution with moderate surrounding ischemic penumbra as above.   Interventional Radiology 03/09/2019 S/P bilateral common carotid and RT vertbral arteriograms followed by attempted revascularization of occluded Lt ICA prox . MCA revascularization via collaterals TICI 2b to 2C .  MRI Head WO Contrast  03/09/2019 1. Acute Left MCA infarct involving the middle/anterior division territory similar to the CTP core estimate. Trace petechial hemorrhage (Heidelberg Classification 1a). No mass effect. 2. Asymmetrically decreased Left ICA petrous segment flow void, but otherwise preserved major vascular flow voids. 3. Indeterminate abnormal marrow signal in the clivus, which had an unremarkable CT appearance yesterday. Recommend follow-up Two-view Chest Radiographs when possible, and if negative a 3 month follow-up noncontrast Brain MRI to re-evaluate the clivus.   Transthoracic Echocardiogram  8/2//2020  1. The left ventricle has normal systolic function with an ejection fraction  of 60-65%. The cavity size was normal. Left ventricular diastolic parameters were normal.  2. The right ventricle has normal systolic function. The cavity was normal. There is no increase in right ventricular wall thickness.  3. No evidence of mitral valve stenosis.  4. The tricuspid valve is grossly normal.  5. No stenosis of the aortic valve.  6. The aorta is normal in size and structure.  7. The aortic root and ascending aorta are normal in size and structure.  8. The interatrial septum was not assessed.   PHYSICAL EXAM   Pleasant middle-aged Caucasian male not in distress. . Afebrile. Head is nontraumatic. Neck is supple without bruit.    Cardiac exam no murmur or gallop. Lungs are clear to auscultation. Distal pulses are well felt.  Right groin  pulsatile swelling mildly tender to touch. Neurological Exam :  Awake alert oriented to time place and person.  Moderate expressive aphasia with word finding difficulties and paraphasic errors.  Able to speak occasional short sentences.  Good comprehension and follows commands well.  Poor naming and repetition.  Dysarthria present.    Mild right lower facial weakness.  Tongue midline.  Extraocular movements full range without nystagmus.  Blinks to threat bilaterally.  Motor system exam symmetric upper and lower extremity strength no focal weakness.  Fine finger movements are slightly diminished on the right.  Orbits left over right upper extremity.  Sensation appears preserved bilaterally.  Gait not tested.   ASSESSMENT/PLAN [name] is a 63 y.o. male with history of coronary artery disease, ongoing tobacco use, hx of NICM, tonsillar ca in remission, atrial fibrillation on anticoagulation which was held for surgery since last Tuesday - Surgery [facility] 03/06/19 - diverticulitis of the large intestine with abscess requiring exploratory laparotomy, low anterior resection rigid proctoscopy, appendectomy. Post op the pt developed speech difficulties. He did not receive IV t-PA due to recent surgery. He was transferred to [facility] where he went to IR with TICI 2b revascularization of the L MCA, w/ attempted revascularization of the prox L ICA.  Stroke:  Left MCA infarct embolic d/t known AF off AC for recent surgery s/p IR with attempted revascularization occluded left ICA and partial revascularization L MCA  CT head - Normal appearance of the brain itself. However, there is a definite hyperdense left M2 branch consistent with an acute embolus  CTA H&N - Acute occlusion of the left ICA at its origin. Reconstitution of the ICA by collaterals at the skull base level. Occluded left M2 branch.  CT Perfusion - Acute core infarct involving the anterior left MCA distribution with moderate surrounding ischemic  penumbra   Cerebral angio occluded L ICA w/ attempted revascularization. MCA revascularization per collaterals TICI 2b.  Post IR CT no ICH or mass effect  MRI head - L MCA infarct, trace petechial hmg. Decreased L ICA flow void. indeterminate abnormal marrow signal in the clivius. Check view CXR and iof neg a 3-mo f/u noncontrast MRI to re-eval the clivus  2D Echo  - EF 60 - 65%. No cardiac source of emboli identified.   [place] Corona Virus 2 - negative  LDL - 46  HgbA1c - 5.5  VTE prophylaxis - Lovenox  Eliquis (apixaban) daily prior to admission, now on heparin IV. Ok to start oral AC from surgeon's standpoint; not yet cleared to swallow.   Therapy recommendations:  OP PT, OT, SLP  Disposition:  Return home  Atrial Fibrillation  Home anticoagulation:  Eliquis (apixaban) daily -  on hold for recent surgery  Now on IV heparin  Ok for [facility] per surgeon but unable to swallow  Plan AC once able to take pos   ICA occlusion  Chronically occluded L ICA  Attempted revascxularization  Hypertension  Stable . On phenylephrine - weaning . BP goal per IR 24h post IR  . BP goal now  SBP 120-140 . Long-term BP goal normotensive  Hyperlipidemia  Lipid lowering medication PTA:  Lipitor 40 mg daily  LDL 46, goal < 70  Current lipid lowering medication:  Lipitor 40 mg daily  Continue statin at discharge  Dysphagia . Secondary to stroke . NPO . Failed MBS . Cortrak placed w/ tube feedings . Surgeon has okayed solids once able to swallow . Swallow impacted by prior ACDF and radiation tx to tonsils. Had PEG at that time. . Hope for improvement over the next few days.   Speech following   R groin hematoma post IR   Noted several days after IR  Hgb 12.5-10.8-9.5-9.4  Appears stable  For [place] to rule out pseudoaneurysm  Other Stroke Risk Factors  Advanced age  Cigarette smoker - advised to stop smoking  ETOH use, advised to drink no more than 1 alcoholic beverage  per day.  Coronary artery disease  Nonischemic CM  Other Active Problems  Mild Thrombocytopenia, resolved - 125...->152  S/P exploratory laparotomy, low anterior resection rigid proctoscopy, appendectomy 03/06/19 - path c/w diverticulitis. benign  CBC w/ atypical lymphocytes  History of tonsillar cancer treated with radiation and cisplatin and 2005. Had a G-tube placed at the time.  Hypokalemia 3.3 supplemented  Hospital day # 5  Patient has developed right groin pulsatile swelling.  Check groin ultrasound to look for pseudoaneurysm.  Discussed with Dr. Estanislado Pandy who will follow and consult vascular surgery if needed.  May need to hold IV heparin if interventional procedure to obliterate pseudoaneurysm is necessary.  Continue speech therapy follow-up for dysphagia and tube feeds. Greater than 50% time during this 35-minute visit was spent on counseling and coordination of care and discussion with care team  Antony Contras, [place] Pager: [phone] 03/12/2019 8:25 AM   To contact Stroke Continuity provider, please refer to [email or web address] After hours, contact General Neurology

## 2019-03-12 NOTE — Progress Notes (Addendum)
ANTICOAGULATION CONSULT NOTE   Pharmacy Consult for Heparin Indication: atrial fibrillation and stroke  No Known Allergies  Patient Measurements: Height: 5\' 8"  (172.7 cm) Weight: 135 lb 9.3 oz (61.5 kg) IBW/kg (Calculated) : 68.4 Heparin Dosing Weight: 63.8 kg  Vital Signs: Temp: 98 F (36.7 C) (08/05 1200) Temp Source: Oral (08/05 1200) BP: 133/64 (08/05 1340) Pulse Rate: 60 (08/05 1340)  Labs: Recent Labs    03/10/19 0545 03/10/19 1235 03/10/19 2034 03/11/19 0809 03/12/19 0321  HGB 10.8*  --   --  9.5* 9.4*  HCT 31.2*  --   --  27.5* 26.7*  PLT 140*  --   --  146* 152  APTT 51* 25 88*  --   --   HEPARINUNFRC <0.10* <0.10* 0.28* 0.31 0.24*  CREATININE  --   --   --   --  0.47*    Estimated Creatinine Clearance: 82.2 mL/min (A) (by C-G formula based on SCr of 0.47 mg/dL (L)).  Assessment: 63 year old male on Eliquis prior to admission for atrial fibrillation. Patient was admitted to [facility] on 7/31 for robot assisted low anterior resection and appendectomy and Eliquis had been held for surgery - last dose on 7/28. POD#1 patient began to experience stroke symptoms and L-MCA stroke noted on CT. Patient was transferred to [facility] for IR intervention. Attempted mechanical thrombectomy was unsuccessful and carotid artery was not amenable to stent.    Heparin level low this am 0.24, and heparin infusion rate increased to 1450 units/hr. Pt S/P US-guided thrombin injection of R common femoral artery pseudoaneurysm this afternoon, for which heparin was held at 11:27 AM. Pharmacy consulted to restart heparin at 4 PM today for a fib (stroke protocol).  CBC stable  Goal of Therapy:  Heparin level 0.3 to 0.5 units/ml Monitor platelets by anticoagulation protocol: Yes   Plan:  Restart heparin at 1450 units/hr at 4 PM today (discussed plan with RN) 6-hr HL at 2200 Daily HL, CBC  Gillermina Hu, PharmD, BCPS, [facility] Clinical Pharmacist Please check AMION for all [place]  numbers  03/12/2019 1:46 PM

## 2019-03-12 NOTE — Progress Notes (Signed)
Occupational Therapy Treatment Patient Details Name: Adrian [name] MRN: [id number] DOB: [birth year] Today's Date: 03/12/2019    History of present illness Pt is a 63 y.o. M with significant PMH of CAD, tonsillar CA in remission, A-fib on anticoagulation which was held for recent surgery who was transferred emergently from [facility] where had been admitted for diverticulitis of large intestine with abscess requiring exploratory laparotomy, low anterior resection rigid proctoscopy, appendectomy. Presents with difficulty speaking. CTA head and neck showing left cervical carotid occlusion as well as left MCA occlusion. Attempted mechanical thrombectomy was unsuccessful and carotid artery also could not be revascularized.  WSF:KCLEX Left MCA infarct involving the middle/anterior divisionterritory.   OT comments  Pt becoming frustrated at times during session when challenging cognition. Pt able to give response with increased time. When challenged with rapid fire of questions, pt shuts down and stops attempting. Pt unable to write "July" but when given answer able to verbalize.   Follow Up Recommendations  CIR    Equipment Recommendations  None recommended by OT    Recommendations for Other Services      Precautions / Restrictions Precautions Precautions: Fall Precaution Comments: SBP 120-140; NG tube       Mobility Bed Mobility               General bed mobility comments: hob increased by RN to allow for cognitive assessment / visual assessment  Transfers                      Balance                                           ADL either performed or assessed with clinical judgement   ADL Overall ADL's : Needs assistance/impaired                                       General ADL Comments: pt currently supine on bedrest orders so deferred to next session     Vision       Perception     Praxis      Cognition  Arousal/Alertness: Awake/alert Behavior During Therapy: WFL for tasks assessed/performed Overall Cognitive Status: Impaired/Different from baseline Area of Impairment: Orientation;Memory;Safety/judgement;Awareness;Problem solving                 Orientation Level: Disoriented to;Time;Situation   Memory: Decreased recall of precautions;Decreased short-term memory   Safety/Judgement: Decreased awareness of safety;Decreased awareness of deficits Awareness: Emergent Problem Solving: Slow processing;Difficulty sequencing General Comments: pt provided various aspects to Short Bless and [place] due to language deficits. Pt is able to idenity all animals on MOCA. Pt able to verbalize JAn- Oct months but unable to retrieve Nov / dec witout mod cues. pt asked to count by 5s and counts 5 10 20 30  40 50. pt cued that is by 10s  do it by 5s - 5 10 15  and pt able to continue 20 25 30  35 40 45 50. Pt asked to say ABCs and able to completed that but very slurred K- x and unintelligible a few times. Pt laughing when therapist sings "now i know my abcs wont you sing with me" Pt unable to states 2020 but when asked to write the year writes 2020. pt states  i know but i and points to mouth. pt demonstrates errors with reading in the Right visual field. pt unabel to write to say the month. pt provided 3 JUNE July August. Pt states July. Ot states that was last month. what is the month following [place]. pt states June. Ot states june was the month before. Pt states with laugh the last one then.         Exercises     Shoulder Instructions       General Comments      Pertinent Vitals/ Pain       Pain Assessment: No/denies pain  Home Living                                          Prior Functioning/Environment              Frequency  Min 3X/week        Progress Toward Goals  OT Goals(current goals can now be found in the care plan section)  Progress towards OT goals:  Progressing toward goals  Acute Rehab OT Goals Patient Stated Goal: unable to clearly state OT Goal Formulation: Patient unable to participate in goal setting Time For Goal Achievement: 03/24/19 Potential to Achieve Goals: Good ADL Goals Pt Will Perform Grooming: with set-up;with supervision;standing Pt Will Perform Upper Body Bathing: with set-up;with supervision;standing;sitting Pt Will Perform Lower Body Bathing: with set-up;with supervision;sit to/from stand Additional ADL Goal #1: Pt will need no more than min A for higher level cogntive tasks  Plan Discharge plan needs to be updated    Co-evaluation                 AM-PAC OT "6 Clicks" Daily Activity     Outcome Measure   Help from another person eating meals?: Total Help from another person taking care of personal grooming?: A Little Help from another person toileting, which includes using toliet, bedpan, or urinal?: A Little Help from another person bathing (including washing, rinsing, drying)?: A Little Help from another person to put on and taking off regular upper body clothing?: A Little Help from another person to put on and taking off regular lower body clothing?: A Little 6 Click Score: 16    End of Session    OT Visit Diagnosis: Unsteadiness on feet (R26.81);Cognitive communication deficit (R41.841) Symptoms and signs involving cognitive functions: Cerebral infarction   Activity Tolerance Patient tolerated treatment well   Patient Left in bed;with call bell/phone within reach;with nursing/sitter in room;with bed alarm set   Nurse Communication Mobility status;Precautions        Time: [phone] OT Time Calculation (min): 11 min  Charges: OT General Charges $OT Visit: 1 Visit OT Treatments $Cognitive Funtion inital: Initial 15 mins   Jeri Modena, OTR/L  Acute Rehabilitation Services Pager: [phone] Office: [phone] .    Jeri Modena 03/12/2019, 3:47 PM

## 2019-03-12 NOTE — Progress Notes (Signed)
PT Cancellation Note  Patient Details Name: Adrian [name] MRN: [id number] DOB: [birth year]   Cancelled Treatment:    Reason Eval/Treat Not Completed: Patient at procedure or test/unavailable   Ellamae Sia, PT, DPT Acute Rehabilitation Services Pager [phone] Office [phone]    Willy Eddy 03/12/2019, 12:43 PM

## 2019-03-12 NOTE — Progress Notes (Signed)
Right pseudoaneurysm evaluation has been completed. Preliminary results can be found in CV Proc through chart review.  Results were given to   03/12/19 9:47 AM Adrian [name] RVT

## 2019-03-12 NOTE — Consult Note (Signed)
Vascular and Vein Specialist of [facility]  Patient name: Adrian [name] MRN: [id number] DOB: [birth year] Sex: male    HPI: Adrian [name] is a 63 y.o. male seen for evaluation of right common femoral artery false aneurysm.  He is very complex hospitalization.  He underwent anterior resection of his colon on 7/31.  On 03/08/2019 was noted to have expressive aphasia.  Underwent CT showing left MCA occlusion.  He was transferred to [facility] and underwent cerebral arteriography via right groin approach.  Attempts at crossing the left occluded and internal carotid artery were unsuccessful.  Patient has had progression of his rehabilitation and today was noted to have pain and swelling in his right groin.  He is on heparin due to his neurologic event and also cardiac arrhythmia.  He does have a large femoral false aneurysm by ultrasound  Past Medical History:  Diagnosis Date  . AF (paroxysmal atrial fibrillation) ([place]) 11/29/2018  . Atrial flutter ([place]) 10/12/2015  . CAD (coronary artery disease), native coronary artery 12/06/2015   a. 10/2015 MV: EF  37%, reversible defect inferior apex, intermediate risk findings; b. 10/2015 Cath: 20% mid RCA.  . Colonic diverticular abscess   . Diverticulitis   . History of chemotherapy 2005   Cisplatin  . Hypothyroidism   . NICM (nonischemic cardiomyopathy) ([place]) 10/14/2015   a. Tachy mediated?;  b. Echo 3/17 - Mild concentric LVH, EF 30-35%, anteroseptal, anterior, anterolateral, apical anterior, lateral hypokinesis, trivial MR, mild to moderately reduced RVSF; c. LHC 3/17 - mRCA 20%  . Paroxysmal atrial flutter ([place])    a. TEE 3/17 with ? LAA clot-->s/p TEE/DCCV 11/24/2015  . Radiation NOv.3,2005-Dec. 15, 2005   6810 cGy in 30 fractions  . 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  XRT    Family History  Problem Relation Age of Onset  . Prostate cancer Father   . Colon cancer Father   . Skin cancer Mother   . Cancer  Brother   . Esophageal cancer Neg Hx   . Rectal cancer Neg Hx   . Stomach cancer Neg Hx     SOCIAL HISTORY: Social History   Tobacco Use  . Smoking status: Current Every Day Smoker    Packs/day: 0.50    Years: 20.00    Pack years: 10.00    Types: Cigarettes  . Smokeless tobacco: Never Used  . Tobacco comment: 1/2 pack per day  Substance Use Topics  . Alcohol use: Yes    Alcohol/week: 12.0 standard drinks    Types: 12 Cans of beer per week    Comment: couple of beers each day    No Known Allergies  Current Facility-Administered Medications  Medication Dose Route Frequency Provider Last Rate Last Dose  . [MAR Hold] 0.9 %  sodium chloride infusion  250 mL Intravenous PRN Michael Boston, MD      . 0.9 %  sodium chloride infusion   Intravenous Continuous Luanne Bras, MD   Stopped at 03/12/19 1227  . 0.9 %  sodium chloride infusion  250 mL Intravenous Continuous Bowser, Laurel Dimmer, NP      . Doug Sou Hold] acetaminophen (TYLENOL) solution 650 mg  650 mg Per Tube Q4H PRN Luanne Bras, MD       Or  . [  MAR Hold] acetaminophen (TYLENOL) suppository 650 mg  650 mg Rectal Q4H PRN Deveshwar, Willaim Rayas, MD      . Doug Sou Hold] alum & mag hydroxide-simeth (MAALOX/MYLANTA) 200-200-20 MG/5ML suspension 30 mL  30 mL Per Tube Q6H PRN Michael Boston, MD      . Doug Sou Hold] diphenhydrAMINE (BENADRYL) 12.5 MG/5ML elixir 12.5 mg  12.5 mg Per Tube Q6H PRN Michael Boston, MD       Or  . Doug Sou Hold] diphenhydrAMINE (BENADRYL) injection 12.5 mg  12.5 mg Intravenous Q6H PRN Michael Boston, MD      . feeding supplement (OSMOLITE 1.2 CAL) liquid 1,000 mL  1,000 mL Per Tube Continuous Garvin Fila, MD 60 mL/hr at 03/12/19 0600 1,000 mL at 03/12/19 0600  . [MAR Hold] feeding supplement (PRO-STAT SUGAR FREE 64) liquid 30 mL  30 mL Per Tube Daily Garvin Fila, MD   30 mL at 03/11/19 1023  . heparin ADULT infusion 100 units/mL (25000 units/282mL sodium chloride 0.45%)  1,450 Units/hr Intravenous Continuous  Wynell Balloon, RPH   Stopped at 03/12/19 1127  . [MAR Hold] hydrALAZINE (APRESOLINE) injection 10 mg  10 mg Intravenous Q2H PRN Michael Boston, MD      . Doug Sou Hold] HYDROmorphone (DILAUDID) injection 0.5-2 mg  0.5-2 mg Intravenous Q4H PRN Michael Boston, MD      . Doug Sou Hold] lip balm (CARMEX) ointment 1 application  1 application Topical BID Michael Boston, MD   1 application at 86/76/19 1015  . [MAR Hold] magic mouthwash  15 mL Oral QID PRN Michael Boston, MD      . Doug Sou Hold] metoprolol tartrate (LOPRESSOR) injection 5 mg  5 mg Intravenous Q6H PRN Michael Boston, MD      . Doug Sou Hold] ondansetron [facility]) tablet 4 mg  4 mg Oral Q6H PRN Michael Boston, MD       Or  . Doug Sou Hold] ondansetron [name]) injection 4 mg  4 mg Intravenous Q6H PRN Michael Boston, MD      . Doug Sou Hold] phenylephrine (NEOSYNEPHRINE) 10-0.9 MG/250ML-% infusion  25-200 mcg/min Intravenous Titrated Garvin Fila, MD   Stopped at 03/12/19 [phone]  . [MAR Hold] prochlorperazine (COMPAZINE) tablet 10 mg  10 mg Oral Q6H PRN Michael Boston, MD       Or  . Doug Sou Hold] prochlorperazine (COMPAZINE) injection 5-10 mg  5-10 mg Intravenous Q6H PRN Michael Boston, MD      . Doug Sou Hold] sodium chloride flush (NS) 0.9 % injection 3 mL  3 mL Intravenous Gorden Harms, MD   3 mL at 03/12/19 1104  . [MAR Hold] sodium chloride flush (NS) 0.9 % injection 3 mL  3 mL Intravenous PRN Michael Boston, MD   3 mL at 03/11/19 1028  . [MAR Hold] thrombin 5,000 units for percutaneous treatment of pseudoaneurysm   Percutaneous Once Marty Heck, MD      . Doug Sou Hold] traMADol Veatrice Bourbon) tablet 50-100 mg  50-100 mg Oral Q6H PRN Michael Boston, MD   50 mg at 03/07/19 2142    REVIEW OF SYSTEMS:  [X]  denotes positive finding, [ ]  denotes negative finding Cardiac  Comments:  Chest pain or chest pressure:    Shortness of breath upon exertion:    Short of breath when lying flat:    Irregular heart rhythm: x       Vascular    Pain in calf, thigh, or hip  brought on by ambulation:    Pain in feet at night that wakes  you up from your sleep:     Blood clot in your veins:    Leg swelling:           PHYSICAL EXAM: Vitals:   03/12/19 0900 03/12/19 1000 03/12/19 1100 03/12/19 1200  BP: 130/65 118/63 (!) 113/58 128/68  Pulse:  61 (!) 55 64  Resp: (!) 22 16 11 19   Temp:    98 F (36.7 C)  TempSrc:    Oral  SpO2:  97% 100% 100%  Weight:      Height:        GENERAL: The patient is a well-nourished male, in no acute distress. The vital signs are documented above. CARDIOVASCULAR: Large right femoral false aneurysm.  Easily palpable dorsalis pedis pulses bilaterally PULMONARY: There is good air exchange  MUSCULOSKELETAL: There are no major deformities or cyanosis. NEUROLOGIC: No focal weakness or paresthesias are detected.  Expressive aphasia SKIN: There are no ulcers or rashes noted. PSYCHIATRIC: The patient has a normal affect.  DATA:  Duplex reveals right femoral false aneurysm over 1.5 cm with 5 mm neck.  Looks to be multi-lobed.  MEDICAL ISSUES: Discussed options with the patient and his family present.  I have recommended thrombin injection as initial treatment for correction of this.  The patient is very thin and has a easily palpable false aneurysm.  Explained that he may require surgical repair if this is unsuccessful.  We will proceed with thrombin injection this afternoon.  Have held his heparin and his tube feeding    Rosetta Posner, MD [facility] Vascular and Vein Specialists of [facility] Tel [phone] Pager [phone]

## 2019-03-12 NOTE — Progress Notes (Signed)
Nutrition Follow-up  DOCUMENTATION CODES:   Not applicable  INTERVENTION:   Continue:  Osmolite 1.2 @ 60 ml/hr  30 ml Prostat daily  Provides: 1828 kcal, 94 grams protein, and 1167 ml free water.   NUTRITION DIAGNOSIS:   Inadequate oral intake related to inability to eat as evidenced by NPO status. Ongoing  GOAL:   Patient will meet greater than or equal to 90% of their needs Met.   MONITOR:   TF tolerance  REASON FOR ASSESSMENT:   Consult Enteral/tube feeding initiation and management  ASSESSMENT:   Pt with PMH of ongoing tobacco use, tonsillar ca in remission, afib on anticoagulation which was held for surgery. Pt admitted to [facility] for diverticulitis of the large intestine with abscess on 7/31 pt s/p ex lap, low anterior resection, rigid proctoscopy, and appendectomy. On 8/1 found to have L ICA stroke s/p unsuccessful IR revascularization.   8/3 Pt failed swallow evaluation; Cortrak placed. TF started   Medications reviewed and include:  Neo stopped  Labs reviewed    NUTRITION - FOCUSED PHYSICAL EXAM:  Deferred   Diet Order:   Diet Order            Diet NPO time specified Except for: Ice Chips  Diet effective now        Diet - low sodium heart healthy              EDUCATION NEEDS:   No education needs have been identified at this time  Skin:  Skin Assessment: Reviewed RN Assessment  Last BM:  8/4  Height:   Ht Readings from Last 1 Encounters:  03/07/19 5' 8"  (1.727 m)    Weight:   Wt Readings from Last 1 Encounters:  03/11/19 61.5 kg    Ideal Body Weight:  70 kg  BMI:  Body mass index is 20.62 kg/m.  Estimated Nutritional Needs:   Kcal:  1800-2000  Protein:  90-110 grams  Fluid:  > 2L/day   [place], [place], [place] Pager [phone] After Hours Pager

## 2019-03-12 NOTE — Progress Notes (Addendum)
ANTICOAGULATION CONSULT NOTE   Pharmacy Consult for Heparin Indication: atrial fibrillation and stroke  No Known Allergies  Patient Measurements: Height: 5\' 8"  (172.7 cm) Weight: 135 lb 9.3 oz (61.5 kg) IBW/kg (Calculated) : 68.4 Heparin Dosing Weight: 63.8 kg  Vital Signs: Temp: 98.9 F (37.2 C) (08/05 1600) Temp Source: Oral (08/05 1600) BP: 123/72 (08/05 1800) Pulse Rate: 60 (08/05 1800)  Labs: Recent Labs    03/10/19 0545 03/10/19 1235 03/10/19 2034 03/11/19 0809 03/12/19 0321 03/12/19 2137  HGB 10.8*  --   --  9.5* 9.4*  --   HCT 31.2*  --   --  27.5* 26.7*  --   PLT 140*  --   --  146* 152  --   APTT 51* 25 88*  --   --   --   HEPARINUNFRC <0.10* <0.10* 0.28* 0.31 0.24* 0.16*  CREATININE  --   --   --   --  0.47*  --     Estimated Creatinine Clearance: 82.2 mL/min (A) (by C-G formula based on SCr of 0.47 mg/dL (L)).  Assessment: 63 year old male on Eliquis prior to admission for atrial fibrillation. Patient was admitted to [facility] long on 7/31 for robot assisted low anterior resection and appendectomy and Eliquis had been held for surgery - last dose on 7/28. POD#1 patient began to experience stroke symptoms and L-MCA stroke noted on CT. Patient was transferred to [facility] for IR intervention. Attempted mechanical thrombectomy was unsuccessful and carotid artery was not amenable to stent.    Pt S/P US-guided thrombin injection of R common femoral artery pseudoaneurysm this afternoon, for which heparin was held then restarted heparin at 4 PM today  -Heparin level= 0.16 on 1450 units/hr   Goal of Therapy:  Heparin level 0.3 to 0.5 units/ml Monitor platelets by anticoagulation protocol: Yes   Plan:  -Increase heparin to 1600 units/hr -Heparin level in 6 hours and daily wth CBC daily  Hildred Laser, PharmD Clinical Pharmacist **Pharmacist phone directory can now be found on [email or web address] ([email or web address]).  Listed under [place].

## 2019-03-12 NOTE — Progress Notes (Signed)
Referring Physician(s): Code Stroke- Rory Percy, Ashish  Supervising Physician: Luanne Bras  Patient Status:  [facility] - In-pt  Chief Complaint: "Right groin"  Subjective:  Acute CVA (left MCA infarct) s/p diagnostic cerebral arteriogram with attempted revascularization of occluded proximal left ICA achieving a TICI 2b to 2c revascularization 03/09/2019 by Dr. Estanislado Pandy. Patient awake and alert laying in bed. States that his right groin is painful to touch, no pain when walking. Can spontaneously move all extremities. Speech dysarthric. Mild right facial droop. Right groin (access site) incision with hardness and surrounding ecchymosis; left groin (attempted access site) incision c/d/i.   Allergies: Patient has no known allergies.  Medications: Prior to Admission medications   Medication Sig Start Date End Date Taking? Authorizing Provider  apixaban (ELIQUIS) 5 MG TABS tablet Take 1 tablet (5 mg total) by mouth 2 (two) times daily. Patient not taking: Reported on 03/05/2019 02/08/18  Yes Turner, Eber Hong, MD  atorvastatin (LIPITOR) 40 MG tablet Take 1 tablet (40 mg total) by mouth daily. Please keep upcoming appt in September for future refills. Thank you Patient taking differently: Take 40 mg by mouth 3 (three) times a week.  03/01/18  Yes Imogene Burn, PA-C  nebivolol (BYSTOLIC) 10 MG tablet Take 1 tablet (10 mg total) by mouth daily. 05/21/18  Yes Turner, Eber Hong, MD  acetaminophen (TYLENOL) 325 MG tablet Take 2 tablets (650 mg total) by mouth every 6 (six) hours as needed for mild pain (or Fever >/= 101). Patient not taking: Reported on 03/06/2019 12/04/18   Eugenie Filler, MD  levothyroxine (SYNTHROID) 125 MCG tablet TAKE 1 TABLET BY MOUTH EVERY DAY 03/11/19   Burchette, Alinda Sierras, MD  Multiple Vitamin (MULTIVITAMIN WITH MINERALS) TABS tablet Take 1 tablet by mouth daily. Patient not taking: Reported on 02/27/2019 12/05/18   Eugenie Filler, MD  traMADol (ULTRAM) 50 MG tablet  Take 1-2 tablets (50-100 mg total) by mouth every 6 (six) hours as needed for moderate pain or severe pain. 03/07/19   Michael Boston, MD     Vital Signs: BP 124/67    Pulse 63    Temp 98.5 F (36.9 C) (Oral)    Resp 19    Ht 5\' 8"  (1.727 m)    Wt 135 lb 9.3 oz (61.5 kg)    SpO2 98%    BMI 20.62 kg/m   Physical Exam Vitals signs and nursing note reviewed.  Constitutional:      General: He is not in acute distress.    Appearance: Normal appearance.  Pulmonary:     Effort: Pulmonary effort is normal. No respiratory distress.  Skin:    General: Skin is warm and dry.     Comments: Right groin (access site) marked with approximately 3-4 cm hardness with surrounding ecchymosis; left groin (attempted access site) incision soft without active bleeding or hematoma.   Neurological:     Mental Status: He is alert.     Comments: Alert, awake, and oriented x3. Speech dysarthric, comprehension intact. PERRL bilaterally. Mild right facial droop. Tongue midline. Can spontaneously move all extremities. Distal pulses 1+ bilaterally.   Psychiatric:        Mood and Affect: Mood normal.        Behavior: Behavior normal.        Thought Content: Thought content normal.        Judgment: Judgment normal.     Imaging: Ct Angio Head W Or Wo Contrast  Result Date: 03/08/2019 CLINICAL DATA:  Expressive aphasia.  Left hyperdense MCA at head CT. EXAM: CT ANGIOGRAPHY HEAD AND NECK TECHNIQUE: Multidetector CT imaging of the head and neck was performed using the standard protocol during bolus administration of intravenous contrast. Multiplanar CT image reconstructions and MIPs were obtained to evaluate the vascular anatomy. Carotid stenosis measurements (when applicable) are obtained utilizing NASCET criteria, using the distal internal carotid diameter as the denominator. CONTRAST:  158mL OMNIPAQUE IOHEXOL 350 MG/ML SOLN COMPARISON:  Head CT earlier same day. FINDINGS: CTA NECK FINDINGS Aortic arch: Mild aortic  atherosclerosis. No aneurysm or dissection. Left vertebral artery arises directly from the arch. Right carotid system: Common carotid artery widely patent to the bifurcation. Mild atherosclerotic disease at carotid bifurcation but no stenosis or irregularity. Cervical ICA widely patent. Left carotid system: Common carotid artery is widely patent to the bifurcation. Advanced atherosclerotic disease at the carotid bifurcation with soft and calcified plaque and occlusion of the ICA at the bulb. No reconstituted flow in the cervical region. Small amount of reconstituted flow in the carotid canal. Vertebral arteries: Both vertebral artery origins are widely patent. Left vertebral artery arises from the arch as noted above. Both vertebral arteries widely patent through the cervical region. Skeleton: Cervical spondylosis.  Previous ACDF C5-6. Other neck: No mass or adenopathy. Upper chest: Pleural and parenchymal scarring at both lung apices. Review of the MIP images confirms the above findings CTA HEAD FINDINGS Anterior circulation: Right internal carotid artery is widely patent through the skull base and siphon region. Mild siphon atherosclerotic calcification. Right anterior and middle cerebral arteries are widely patent. Left internal carotid artery shows mild reconstituted flow in the carotid canal and flow in the siphon region. Siphon atherosclerotic calcification. Left anterior cerebral artery is widely patent. Left M1 segment is widely patent. Occluded left M2 branch as suggested by noncontrast CT. Posterior circulation: Both vertebral arteries are widely patent to the basilar. No basilar stenosis. Posterior circulation branch vessels are patent. Venous sinuses: Patent and normal. Anatomic variants: None significant. Review of the MIP images confirms the above findings IMPRESSION: Acute occlusion of the left ICA at its origin. Reconstitution of the ICA by collaterals at the skull base level. Occluded left M2  branch. These results were called by telephone at the time of interpretation on 03/08/2019 at 9:55 pm to Dr. Armandina Gemma , who verbally acknowledged these results. Electronically Signed   By: Nelson Chimes M.D.   On: 03/08/2019 22:00   Ct Angio Neck W Or Wo Contrast  Result Date: 03/08/2019 CLINICAL DATA:  Expressive aphasia.  Left hyperdense MCA at head CT. EXAM: CT ANGIOGRAPHY HEAD AND NECK TECHNIQUE: Multidetector CT imaging of the head and neck was performed using the standard protocol during bolus administration of intravenous contrast. Multiplanar CT image reconstructions and MIPs were obtained to evaluate the vascular anatomy. Carotid stenosis measurements (when applicable) are obtained utilizing NASCET criteria, using the distal internal carotid diameter as the denominator. CONTRAST:  190mL OMNIPAQUE IOHEXOL 350 MG/ML SOLN COMPARISON:  Head CT earlier same day. FINDINGS: CTA NECK FINDINGS Aortic arch: Mild aortic atherosclerosis. No aneurysm or dissection. Left vertebral artery arises directly from the arch. Right carotid system: Common carotid artery widely patent to the bifurcation. Mild atherosclerotic disease at carotid bifurcation but no stenosis or irregularity. Cervical ICA widely patent. Left carotid system: Common carotid artery is widely patent to the bifurcation. Advanced atherosclerotic disease at the carotid bifurcation with soft and calcified plaque and occlusion of the ICA at the bulb. No reconstituted flow in  the cervical region. Small amount of reconstituted flow in the carotid canal. Vertebral arteries: Both vertebral artery origins are widely patent. Left vertebral artery arises from the arch as noted above. Both vertebral arteries widely patent through the cervical region. Skeleton: Cervical spondylosis.  Previous ACDF C5-6. Other neck: No mass or adenopathy. Upper chest: Pleural and parenchymal scarring at both lung apices. Review of the MIP images confirms the above findings CTA HEAD  FINDINGS Anterior circulation: Right internal carotid artery is widely patent through the skull base and siphon region. Mild siphon atherosclerotic calcification. Right anterior and middle cerebral arteries are widely patent. Left internal carotid artery shows mild reconstituted flow in the carotid canal and flow in the siphon region. Siphon atherosclerotic calcification. Left anterior cerebral artery is widely patent. Left M1 segment is widely patent. Occluded left M2 branch as suggested by noncontrast CT. Posterior circulation: Both vertebral arteries are widely patent to the basilar. No basilar stenosis. Posterior circulation branch vessels are patent. Venous sinuses: Patent and normal. Anatomic variants: None significant. Review of the MIP images confirms the above findings IMPRESSION: Acute occlusion of the left ICA at its origin. Reconstitution of the ICA by collaterals at the skull base level. Occluded left M2 branch. These results were called by telephone at the time of interpretation on 03/08/2019 at 9:55 pm to Dr. Armandina Gemma , who verbally acknowledged these results. Electronically Signed   By: Nelson Chimes M.D.   On: 03/08/2019 22:00   Mr Brain Wo Contrast  Result Date: 03/09/2019 CLINICAL DATA:  63 year old male emergent left ICA and left M2 Large Vessel occlusion status post endovascular treatment. EXAM: MRI HEAD WITHOUT CONTRAST TECHNIQUE: Multiplanar, multiecho pulse sequences of the brain and surrounding structures were obtained without intravenous contrast. COMPARISON:  CT head, CTA head and neck and CT perfusion 03/08/2019. FINDINGS: Brain: Restricted diffusion in the left MCA middle to anterior division territory involving most of the insula, left operculum cortex, cortex of the left inferior frontal gyrus and tracking cephalad to the left middle frontal gyrus (series 7, image 65). This is similar to the estimated core infarct distribution on CTP, perhaps slightly increased in extent. Associated  cytotoxic edema with T2 and FLAIR hyperintensity. Superimposed small area of superior left parietal lobe cortical encephalomalacia on series 9, image 21. Trace petechial hemorrhage at the left insula on series 12, image 26. No contralateral right hemisphere or posterior fossa restricted diffusion. No midline shift, mass effect, evidence of mass lesion, ventriculomegaly, extra-axial collection or acute intracranial hemorrhage. Cervicomedullary junction and pituitary are within normal limits. Vascular: Major intracranial vascular flow voids are preserved, although the left ICA petrous segment flow void is diminished on series 15, image 6. There is some asymmetric left MCA branch FLAIR signal (series 9, image 13). Skull and upper cervical spine: Chronic disc and endplate degeneration at C3-C4 with mild spinal stenosis at that level. There is heterogeneous marrow signal in the clivus on series 14, image 12 which is also conspicuous on diffusion-weighted imaging., but elsewhere visible bone marrow signal is within normal limits. This area appeared normal by CT. Sinuses/Orbits: Negative orbits. Right frontal and frontoethmoidal sinus disease redemonstrated. Other: Mastoids are clear. Visible internal auditory structures appear normal. Scalp and face soft tissues appear negative. IMPRESSION: 1. Acute Left MCA infarct involving the middle/anterior division territory similar to the CTP core estimate. Trace petechial hemorrhage (Heidelberg Classification 1a). No mass effect. 2. Asymmetrically decreased Left ICA petrous segment flow void, but otherwise preserved major vascular flow voids. 3. Indeterminate  abnormal marrow signal in the clivus, which had an unremarkable CT appearance yesterday. Recommend follow-up Two-view Chest Radiographs when possible, and if negative a 3 month follow-up noncontrast Brain MRI to re-evaluate the clivus. Electronically Signed   By: Genevie Ann M.D.   On: 03/09/2019 13:21   [place]  Result Date: 03/11/2019 INDICATION: Acute onset of aphasia. Abnormal CT angiogram of the head and neck. EXAM: 1. EMERGENT LARGE VESSEL OCCLUSION THROMBOLYSIS (anterior CIRCULATION) COMPARISON:  CT angiogram of the head and neck of March 08, 2019. MEDICATIONS: Ancef 2 g IV antibiotic was administered within 1 hour of the procedure. ANESTHESIA/SEDATION: General anesthesia. CONTRAST:  Isovue 300 approximately 100 mL. FLUOROSCOPY TIME:  Fluoroscopy Time: 49 minutes 18 seconds (617 mGy). COMPLICATIONS: None immediate. TECHNIQUE: Following a full explanation of the procedure along with the potential associated complications, an informed witnessed consent was obtained from the patient's spouse. The risks of intracranial hemorrhage of 10%, worsening neurological deficit, ventilator dependency, death and inability to revascularize were all reviewed in detail with the patient's spouse. The patient was then put under general anesthesia by the Department of Anesthesiology at [facility]. The right groin was prepped and draped in the usual sterile fashion. Thereafter using modified Seldinger technique, transfemoral access into the right common femoral artery was obtained without difficulty. Over a 0.035 inch guidewire a 5 French Pinnacle sheath was inserted. Through this, and also over a 0.035 inch guidewire a 5 [place] JB 1 catheter was advanced to the aortic arch region and selectively positioned in the left common carotid artery. Similarly a transfemoral access was obtained with the advancement of a 5 [place] JB 1 diagnostic catheter into the right common carotid artery and the right vertebral artery. FINDINGS: The left common carotid arteriogram demonstrates the left external carotid artery and its major branches to be widely patent. The left internal carotid artery just distal to the bulb demonstrates complete angiographic occlusion with no angiographic evidence of a delayed string sign. There is reconstitution  of the cavernous segment of the left internal carotid artery via the ipsilateral ophthalmic artery from collaterals arising from the lacrimal branches and the internal maxillary artery branches. Opacification is seen of the left middle cerebral artery proximally and the left anterior cerebral artery. The right common carotid arteriogram demonstrates the right external carotid artery and its major branches to be widely patent. The right internal carotid artery at the bulb to the cranial skull base demonstrates wide patency. The petrous, cavernous and the supraclinoid segments are widely patent. The right middle cerebral artery and the right anterior cerebral artery opacify into the capillary and venous phases. Cross-filling via the anterior communicating artery of the right anterior cerebral A2 segment and distally is noted. The right vertebral artery origin is slightly narrowed at its origin. More distally the vessel is seen to opacify to the cranial skull base. Wide patency is seen of the right vertebrobasilar junction and the right posterior-inferior cerebellar artery. The basilar artery, the posterior cerebral arteries, the superior cerebellar arteries and the anterior-inferior cerebellar arteries opacify into the capillary and venous phases. Angiographically asymmetric prominence of the left posterior cerebral arteries. The delayed arterial and the capillary phase demonstrates copious leptomeningeal collaterals from the left posterior cerebral P2 segment reconstituting the left middle cerebral artery posterior frontal and anterior parietal regions. Retrograde opacification of the left pericallosal artery to the level of the anterior 1/3 of the corpus callosum is seen. PROCEDURE: The diagnostic JB 1 catheter in the left  common carotid artery was exchanged over a 0.035 inch 300 cm Rosen exchange guidewire for an 8 [place] Pinnacle sheath in the right groin which was connected to continuous heparinized saline  infusion, and also of an 8 French 85 cm FlowGate balloon guide catheter which was advanced just proximal to the left common carotid bifurcation. The guidewire was removed. Good aspiration obtained from the hub of the [facility] guide catheter. A gentle control arteriogram performed again demonstrated occlusion of the left internal carotid artery at the bulb. Again seen was reconstitution in the cavernous segment. Over a 0.014 inch standard Synchro micro guidewire, an 021 130 cm Trevo ProVue microcatheter was advanced to the left internal carotid artery bulb. The distal end of the [facility] guide catheter was advanced into the bulb. The micro guidewire was then gently manipulated in order to gain access through the occluded left internal carotid artery followed by the microcatheter. Multiple attempts were made without success. The micro guidewire was then replaced with a Ch[place]ai 014 inch micro guidewire which was advanced to the distal end of the microcatheter. Again with a torque device attempts were made to advance the wire through the occluded bulb. This too was unsuccessful. This was then followed by the advancement of a 5 [place] JB 1 diagnostic catheter which was advanced to just proximal to the occluded left internal carotid bulb. An 0.035 Roadrunner guidewire was then advanced slowly with significant resistance through the occlusion. Only partial advancement of the diagnostic catheter was achievable distal to the occlusion. The guidewire was removed. Slow aspiration was obtained from the hub of the diagnostic catheter. A gentle control arteriogram performed continued to demonstrate complete occlusion of the left internal carotid artery. This was then followed by the advancement of a Marksman 150 cm 027 microcatheter over an 024 Aristotle micro guidewire. Using a torque device, access was eventually obtained through the occluded left internal carotid artery followed by the advancement of the Marksman microcatheter  through the petrous cavernous segment. The Aristotle wire was removed. Good aspiration obtained from the hub of the Marksman microcatheter. A gentle control arteriogram performed through the microcatheter demonstrated brisk flow into the distal cavernous and the supraclinoid segments. Also demonstrated was opacification of the anterior cerebral artery and the left middle cerebral artery. The dominant inferior division was seen to be widely patent. The left MCA superior division demonstrated truncated flow in the distal left M2 segment. Moderate surrounding collaterals were seen. A control arteriogram performed through the 8 [place] FlowGate guide catheter in the bulb of the left internal carotid artery continued to demonstrate opacification of the left internal carotid artery to the petrous cavernous junction and distally. The entire extracranial and the petrous segment of the left internal carotid artery continued to demonstrate significantly irregular caliber with attenuation of the caliber itself in the entirety. No gross filling defects were seen. Focal areas of severe narrowing were interspersed with slightly improved caliber in the entirety. There appeared to be a small focal pouch at the level of the distal cervical segment. Selective infusion of a total of 3 mg of Integrilin, was given into the left internal carotid artery over a period of approximately 5 minutes. Also patient had been loaded with aspirin 81 mg p.o, and Brilinta 180 mg p.o. prior to the initial attempts at accessing the occluded left internal carotid artery. A control arteriogram performed through the [facility] guide catheter just proximal to the left common carotid artery bifurcation now demonstrated worsening caliber with string like flow in the  left internal carotid artery extracranially. Intracranially reconstitution of the left internal carotid artery at the bulb into the left middle cerebral artery and the left anterior cerebral artery  continued to be noted. Again a long segment abnormality of the left internal carotid artery to the petrous cavernous segment was more indicative of chronic occlusion of the left internal carotid artery with the development of collaterals as described above. Further attempts at revascularization with multiple stent reconstruction with risk of reocclusion with need for more aggressive anti-platelet treatment with risk of occlusion of the left middle cerebral artery which showed a TICI 2b revascularization. It was therefore decided to stop the procedure. A final control arteriogram performed through the 8 [place] FlowGate guide catheter in the left common carotid artery continued to demonstrate revascularization of the left internal carotid artery distal cavernous segment, and the left middle cerebral artery and the left anterior cerebral artery via the ophthalmic artery and the external collaterals as described. The 8 [place] FlowGate guide catheter was removed. The 8 French Pinnacle sheath was then also removed with the successful application of an 8 French Angio-Seal closure device for hemostasis. The left-sided common femoral artery 5 French sheath was removed with hemostasis achieved with manual compression. Both groins appeared soft without evidence of a hematoma or bleeding. Distal pulses continued to be palpable in the dorsalis pedis, and the posterior tibial regions bilaterally unchanged. A flat panel CT of the brain demonstrated no evidence of mass effect, midline shift or of intracranial hemorrhage. The patient was then extubated without difficulty, however, the patient continued to have difficulty with expression although was able to understand simple commands. The patient moved all four extremities spontaneously and to command. He was then transferred to the neuro ICU to continue with post thrombectomy workup and management. IMPRESSION: Status post endovascular revascularization of probably chronically  occluded left internal carotid artery at the bulb to the level of the petrous cavernous segment, with the left middle cerebral artery patency achieving a TICI 2b revascularization. Reocclusion of the left internal carotid artery proximally as described above. PLAN: Follow-up in the clinic 4 weeks post discharge. Electronically Signed   By: Luanne Bras M.D.   On: 03/10/2019 09:27   Ct Cerebral Perfusion W Contrast  Result Date: 03/08/2019 CLINICAL DATA:  Initial evaluation for acute stroke, known left ICA and left M2 occlusion as seen on prior CTA. EXAM: CT PERFUSION BRAIN TECHNIQUE: Multiphase CT imaging of the brain was performed following IV bolus contrast injection. Subsequent parametric perfusion maps were calculated using RAPID software. CONTRAST:  49mL OMNIPAQUE IOHEXOL 350 MG/ML SOLN COMPARISON:  Prior CTA from earlier same day. FINDINGS: CT Brain Perfusion Findings: CBF (<30%) Volume: 4mL Perfusion (Tmax>6.0s) volume: 17mL Mismatch Volume: 20mL ASPECTS on noncontrast CT Head: 10 at  21:10 today. Infarct Core: 20 mL Infarction Location:Acute core infarct seen involving the anterior left frontal lobe, left MCA distribution. Moderate surrounding ischemic penumbra. IMPRESSION: Acute core infarct involving the anterior left MCA distribution with moderate surrounding ischemic penumbra as above. Electronically Signed   By: Jeannine Boga M.D.   On: 03/08/2019 23:56   Dg Swallowing Func-speech Pathology  Result Date: 03/10/2019 Objective Swallowing Evaluation: Type of Study: MBS-Modified Barium Swallow Study  Patient Details Name: THAER MIYOSHI MRN: [id number] Date of Birth: Dec 07, 1955 Today's Date: 03/10/2019 Time: SLP Start Time (ACUTE ONLY): 0907 -SLP Stop Time (ACUTE ONLY): 0926 SLP Time Calculation (min) (ACUTE ONLY): 19 min Past Medical History: Past Medical History: Diagnosis Date  AF (paroxysmal atrial  fibrillation) ([place]) 11/29/2018  Atrial flutter ([place]) 10/12/2015  CAD (coronary artery  disease), native coronary artery 12/06/2015  a. 10/2015 MV: EF  37%, reversible defect inferior apex, intermediate risk findings; b. 10/2015 Cath: 20% mid RCA.  Colonic diverticular abscess   Diverticulitis   History of chemotherapy 2005  Cisplatin  Hypothyroidism   NICM (nonischemic cardiomyopathy) ([place]) 10/14/2015  a. Tachy mediated?;  b. Echo 3/17 - Mild concentric LVH, EF 30-35%, anteroseptal, anterior, anterolateral, apical anterior, lateral hypokinesis, trivial MR, mild to moderately reduced RVSF; c. LHC 3/17 - mRCA 20%  Paroxysmal atrial flutter ([place])   a. TEE 3/17 with ? LAA clot-->s/p TEE/DCCV 11/24/2015  Radiation NOv.3,2005-Dec. 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  XRT Past Surgical History: Past Surgical History: Procedure Laterality Date  APPENDECTOMY N/A 03/07/2019  Procedure: ROBOTIC ASSISTED APPENDECTOMY;  Surgeon: Michael Boston, MD;  Location: [facility];  Service: General;  Laterality: N/A;  CARDIAC CATHETERIZATION N/A 10/18/2015  Procedure: Left Heart Cath and Coronary Angiography;  Surgeon: Burnell Blanks, MD;  Location: [place] [facility];  Service: Cardiovascular;  Laterality: N/A;  CARDIOVERSION N/A 11/24/2015  Procedure: CARDIOVERSION;  Surgeon: Josue Hector, MD;  Location: [place];  Service: Cardiovascular;  Laterality: N/A;  CYSTOSCOPY WITH STENT PLACEMENT Bilateral 03/07/2019  Procedure: CYSTOSCOPY WITH BILATERAL FIREFLY INJECTION;  Surgeon: Ardis Hughs, MD;  Location: [facility];  Service: Urology;  Laterality: Bilateral;  GASTROSTOMY TUBE PLACEMENT  07/04/2004  IR - G tube for tonsilar cancer  IR RADIOLOGIST EVAL & MGMT  12/18/2018  LAMINECTOMY    C5/placement of steel plate  NECK SURGERY  2003  replaced disk  TEE WITHOUT CARDIOVERSION N/A 10/15/2015  Procedure: TRANSESOPHAGEAL ECHOCARDIOGRAM (TEE);  Surgeon: Larey Dresser, MD;  Location: [place];  Service: Cardiovascular;  Laterality: N/A;  TEE WITHOUT CARDIOVERSION N/A  11/24/2015  Procedure: TRANSESOPHAGEAL ECHOCARDIOGRAM (TEE);  Surgeon: Josue Hector, MD;  Location: [facility] ENDOSCOPY;  Service: Cardiovascular;  Laterality: N/A;  XI ROBOTIC ASSISTED COLOSTOMY TAKEDOWN N/A 03/07/2019  Procedure: XI ROBOTIC ASSISTED LOW ANTERIOR RESECTION, RIGID PROCTOSCOPY;  Surgeon: Michael Boston, MD;  Location: [facility];  Service: General;  Laterality: N/A; HPI: ELENA COTHERN is a 63 y.o. male coronary artery disease, tonsillar ca in remission, atrial fibrillation on anticoagulation which was held for surgery since last Tuesday, transferred emergently from [facility] long hospital where he was admitted for diverticulitis of the large intestine with abscess requiring exploratory laparotomy, low anterior resection rigid proctoscopy, appendectomy, last seen normal at 7:30 PM on 03/08/2019 at [facility] when it was noticed that he had difficulty with speech- speech did not make sense. CTA head and neck was also done which revealed a left cervical carotid occlusion as well as left MCA occlusion. S/P bilateral common carotid and RT vertbral arteriograms followed by attempted revascularization of occluded Lt ICA prox .MCA revascularization via collaterals TICI 2b to 2C . MRI shows Restricted diffusion in the left MCA middle to anterior division territory involving most of the insula, left operculum  Subjective: pt alert, aphasic Assessment / Plan / Recommendation CHL IP CLINICAL [name] 03/10/2019 Clinical Impression Pt has a moderate to severe oropharyngeal dysphagia that is suspected to be acute on chronic given new stroke in addition to prior ACDF and XRT for tonsillar cancer. His oral phase is mildly prolonged, more so as boluses become thicker and mor solid. Bolus cohesion is reduced and spills backward into the pharynx not as a formed  bolus, at times using a posterior head tilt to initiate posterior propulsion (making chin tuck a difficult strategy to use). Pharyngeally he exhibits reduced  pharyngeal squeeze, base of tongue retraction, and hyolaryngeal movement. Epiglottic inversion and airway closure are reduced. He often penetrates during the swallow as a result. More significantly, he also has frequent aspiration before the swallow with thin liquids (a combined result of oral and pharyngeal impairments) and after the swallow with all consistencies. Moderate residue primarily at the valleculae with thin liquids increases up to more severe residue also at the pyriform sinuses with limited entrance into the UES with purees. Varying volume and bolus presentation did not increase airway protection or efficiency of swallow. Most aspiration goes unsensed, and his cued cough and intermittent spontaneous are only able to partially clear the airway. Recommend that he remain NPO with consideration of temporary, alternative means of nutrition. Could also provided a few single ice chips after oral care to utilize swallowing musculature and facilitate secretion management. Will f/u for exercises to maximize swallow and cough as well as completion of speech-language evaluation.  SLP Visit Diagnosis Dysphagia, oropharyngeal phase (R13.12) Attention and concentration deficit following -- Frontal lobe and executive function deficit following -- Impact on safety and function Severe aspiration risk   CHL IP TREATMENT RECOMMENDATION 03/10/2019 Treatment Recommendations Therapy as outlined in treatment plan below   Prognosis 03/10/2019 Prognosis for Safe Diet Advancement Good Barriers to Reach Goals Severity of deficits Barriers/Prognosis Comment -- CHL IP DIET RECOMMENDATION 03/10/2019 SLP Diet Recommendations NPO;Alternative means - temporary Liquid Administration via -- Medication Administration Via alternative means Compensations -- Postural Changes --   CHL IP OTHER RECOMMENDATIONS 03/10/2019 Recommended Consults -- Oral Care Recommendations Oral care QID Other Recommendations Have oral suction available   CHL IP FOLLOW UP  RECOMMENDATIONS 03/10/2019 Follow up Recommendations Inpatient Rehab   CHL IP FREQUENCY AND DURATION 03/10/2019 Speech Therapy Frequency (ACUTE ONLY) min 2x/week Treatment Duration 2 weeks      CHL IP ORAL PHASE 03/10/2019 Oral Phase Impaired Oral - Pudding Teaspoon -- Oral - Pudding Cup -- Oral - Honey Teaspoon -- Oral - Honey Cup Reduced posterior propulsion;Decreased bolus cohesion Oral - Nectar Teaspoon Reduced posterior propulsion;Decreased bolus cohesion Oral - Nectar Cup Reduced posterior propulsion;Decreased bolus cohesion Oral - Nectar Straw -- Oral - Thin Teaspoon Reduced posterior propulsion;Decreased bolus cohesion Oral - Thin Cup Reduced posterior propulsion;Decreased bolus cohesion Oral - Thin Straw -- Oral - Puree Reduced posterior propulsion;Delayed oral transit;Decreased bolus cohesion Oral - Mech Soft -- Oral - Regular -- Oral - Multi-Consistency -- Oral - Pill -- Oral Phase - Comment --  CHL IP PHARYNGEAL PHASE 03/10/2019 Pharyngeal Phase -- Pharyngeal- Pudding Teaspoon -- Pharyngeal -- Pharyngeal- Pudding Cup -- Pharyngeal -- Pharyngeal- Honey Teaspoon -- Pharyngeal -- Pharyngeal- Honey Cup Reduced pharyngeal peristalsis;Reduced epiglottic inversion;Reduced anterior laryngeal mobility;Reduced laryngeal elevation;Reduced airway/laryngeal closure;Reduced tongue base retraction;Pharyngeal residue - valleculae;Pharyngeal residue - pyriform;Penetration/Apiration after swallow Pharyngeal Material enters airway, passes BELOW cords without attempt by patient to eject out (silent aspiration) Pharyngeal- Nectar Teaspoon Reduced pharyngeal peristalsis;Reduced epiglottic inversion;Reduced anterior laryngeal mobility;Reduced laryngeal elevation;Reduced airway/laryngeal closure;Reduced tongue base retraction;Pharyngeal residue - valleculae;Penetration/Apiration after swallow;Pharyngeal residue - pyriform Pharyngeal Material enters airway, passes BELOW cords without attempt by patient to eject out (silent aspiration)  Pharyngeal- Nectar Cup Reduced pharyngeal peristalsis;Reduced epiglottic inversion;Reduced anterior laryngeal mobility;Reduced laryngeal elevation;Reduced airway/laryngeal closure;Reduced tongue base retraction;Pharyngeal residue - valleculae;Penetration/Apiration after swallow;Pharyngeal residue - pyriform Pharyngeal Material enters airway, passes BELOW cords without attempt by patient to eject  out (silent aspiration) Pharyngeal- Nectar Straw -- Pharyngeal -- Pharyngeal- Thin Teaspoon Reduced pharyngeal peristalsis;Reduced epiglottic inversion;Reduced anterior laryngeal mobility;Reduced laryngeal elevation;Reduced airway/laryngeal closure;Reduced tongue base retraction;Pharyngeal residue - valleculae Pharyngeal -- Pharyngeal- Thin Cup Reduced pharyngeal peristalsis;Reduced epiglottic inversion;Reduced anterior laryngeal mobility;Reduced laryngeal elevation;Reduced airway/laryngeal closure;Reduced tongue base retraction;Pharyngeal residue - valleculae;Penetration/Aspiration before swallow;Penetration/Apiration after swallow;Pharyngeal residue - pyriform Pharyngeal Material enters airway, passes BELOW cords without attempt by patient to eject out (silent aspiration);Material enters airway, passes BELOW cords and not ejected out despite cough attempt by patient Pharyngeal- Thin Straw -- Pharyngeal -- Pharyngeal- Puree Reduced pharyngeal peristalsis;Reduced epiglottic inversion;Reduced anterior laryngeal mobility;Reduced laryngeal elevation;Reduced airway/laryngeal closure;Reduced tongue base retraction;Pharyngeal residue - valleculae;Pharyngeal residue - pyriform Pharyngeal -- Pharyngeal- Mechanical Soft -- Pharyngeal -- Pharyngeal- Regular -- Pharyngeal -- Pharyngeal- Multi-consistency -- Pharyngeal -- Pharyngeal- Pill -- Pharyngeal -- Pharyngeal Comment --  CHL IP CERVICAL ESOPHAGEAL PHASE 03/10/2019 Cervical Esophageal Phase Impaired Pudding Teaspoon -- Pudding Cup -- Honey Teaspoon -- Honey Cup Reduced  cricopharyngeal relaxation Nectar Teaspoon Reduced cricopharyngeal relaxation Nectar Cup Reduced cricopharyngeal relaxation Nectar Straw -- Thin Teaspoon Reduced cricopharyngeal relaxation Thin Cup Reduced cricopharyngeal relaxation Thin Straw -- Puree Reduced cricopharyngeal relaxation Mechanical Soft -- Regular -- Multi-consistency -- Pill -- Cervical Esophageal Comment -- Venita Sheffield Nix 03/10/2019, 10:37 AM  [name], M.A. CCC-SLP Acute Rehabilitation Services Pager [phone] Office [phone]             Ir Percutaneous Art Thrombectomy/infusion Intracranial Inc Diag Angio  Result Date: 03/11/2019 INDICATION: Acute onset of aphasia. Abnormal CT angiogram of the head and neck. EXAM: 1. EMERGENT LARGE VESSEL OCCLUSION THROMBOLYSIS (anterior CIRCULATION) COMPARISON:  CT angiogram of the head and neck of March 08, 2019. MEDICATIONS: Ancef 2 g IV antibiotic was administered within 1 hour of the procedure. ANESTHESIA/SEDATION: General anesthesia. CONTRAST:  Isovue 300 approximately 100 mL. FLUOROSCOPY TIME:  Fluoroscopy Time: 49 minutes 18 seconds (617 mGy). COMPLICATIONS: None immediate. TECHNIQUE: Following a full explanation of the procedure along with the potential associated complications, an informed witnessed consent was obtained from the patient's spouse. The risks of intracranial hemorrhage of 10%, worsening neurological deficit, ventilator dependency, death and inability to revascularize were all reviewed in detail with the patient's spouse. The patient was then put under general anesthesia by the Department of Anesthesiology at [facility]. The right groin was prepped and draped in the usual sterile fashion. Thereafter using modified Seldinger technique, transfemoral access into the right common femoral artery was obtained without difficulty. Over a 0.035 inch guidewire a 5 French Pinnacle sheath was inserted. Through this, and also over a 0.035 inch guidewire a 5 [place] JB 1 catheter was  advanced to the aortic arch region and selectively positioned in the left common carotid artery. Similarly a transfemoral access was obtained with the advancement of a 5 [place] JB 1 diagnostic catheter into the right common carotid artery and the right vertebral artery. FINDINGS: The left common carotid arteriogram demonstrates the left external carotid artery and its major branches to be widely patent. The left internal carotid artery just distal to the bulb demonstrates complete angiographic occlusion with no angiographic evidence of a delayed string sign. There is reconstitution of the cavernous segment of the left internal carotid artery via the ipsilateral ophthalmic artery from collaterals arising from the lacrimal branches and the internal maxillary artery branches. Opacification is seen of the left middle cerebral artery proximally and the left anterior cerebral artery. The right common carotid arteriogram demonstrates the right external carotid artery and its  major branches to be widely patent. The right internal carotid artery at the bulb to the cranial skull base demonstrates wide patency. The petrous, cavernous and the supraclinoid segments are widely patent. The right middle cerebral artery and the right anterior cerebral artery opacify into the capillary and venous phases. Cross-filling via the anterior communicating artery of the right anterior cerebral A2 segment and distally is noted. The right vertebral artery origin is slightly narrowed at its origin. More distally the vessel is seen to opacify to the cranial skull base. Wide patency is seen of the right vertebrobasilar junction and the right posterior-inferior cerebellar artery. The basilar artery, the posterior cerebral arteries, the superior cerebellar arteries and the anterior-inferior cerebellar arteries opacify into the capillary and venous phases. Angiographically asymmetric prominence of the left posterior cerebral arteries. The delayed  arterial and the capillary phase demonstrates copious leptomeningeal collaterals from the left posterior cerebral P2 segment reconstituting the left middle cerebral artery posterior frontal and anterior parietal regions. Retrograde opacification of the left pericallosal artery to the level of the anterior 1/3 of the corpus callosum is seen. PROCEDURE: The diagnostic JB 1 catheter in the left common carotid artery was exchanged over a 0.035 inch 300 cm Rosen exchange guidewire for an 8 [place] Pinnacle sheath in the right groin which was connected to continuous heparinized saline infusion, and also of an 8 French 85 cm FlowGate balloon guide catheter which was advanced just proximal to the left common carotid bifurcation. The guidewire was removed. Good aspiration obtained from the hub of the [facility] guide catheter. A gentle control arteriogram performed again demonstrated occlusion of the left internal carotid artery at the bulb. Again seen was reconstitution in the cavernous segment. Over a 0.014 inch standard Synchro micro guidewire, an 021 130 cm Trevo ProVue microcatheter was advanced to the left internal carotid artery bulb. The distal end of the [facility] guide catheter was advanced into the bulb. The micro guidewire was then gently manipulated in order to gain access through the occluded left internal carotid artery followed by the microcatheter. Multiple attempts were made without success. The micro guidewire was then replaced with a [place] 014 inch micro guidewire which was advanced to the distal end of the microcatheter. Again with a torque device attempts were made to advance the wire through the occluded bulb. This too was unsuccessful. This was then followed by the advancement of a 5 [place] JB 1 diagnostic catheter which was advanced to just proximal to the occluded left internal carotid bulb. An 0.035 Roadrunner guidewire was then advanced slowly with significant resistance through the occlusion. Only  partial advancement of the diagnostic catheter was achievable distal to the occlusion. The guidewire was removed. Slow aspiration was obtained from the hub of the diagnostic catheter. A gentle control arteriogram performed continued to demonstrate complete occlusion of the left internal carotid artery. This was then followed by the advancement of a Marksman 150 cm 027 microcatheter over an 024 Aristotle micro guidewire. Using a torque device, access was eventually obtained through the occluded left internal carotid artery followed by the advancement of the Marksman microcatheter through the petrous cavernous segment. The Aristotle wire was removed. Good aspiration obtained from the hub of the Marksman microcatheter. A gentle control arteriogram performed through the microcatheter demonstrated brisk flow into the distal cavernous and the supraclinoid segments. Also demonstrated was opacification of the anterior cerebral artery and the left middle cerebral artery. The dominant inferior division was seen to be widely patent. The left MCA superior  division demonstrated truncated flow in the distal left M2 segment. Moderate surrounding collaterals were seen. A control arteriogram performed through the 8 [place] FlowGate guide catheter in the bulb of the left internal carotid artery continued to demonstrate opacification of the left internal carotid artery to the petrous cavernous junction and distally. The entire extracranial and the petrous segment of the left internal carotid artery continued to demonstrate significantly irregular caliber with attenuation of the caliber itself in the entirety. No gross filling defects were seen. Focal areas of severe narrowing were interspersed with slightly improved caliber in the entirety. There appeared to be a small focal pouch at the level of the distal cervical segment. Selective infusion of a total of 3 mg of Integrilin, was given into the left internal carotid artery over a  period of approximately 5 minutes. Also patient had been loaded with aspirin 81 mg p.o, and Brilinta 180 mg p.o. prior to the initial attempts at accessing the occluded left internal carotid artery. A control arteriogram performed through the [facility] guide catheter just proximal to the left common carotid artery bifurcation now demonstrated worsening caliber with string like flow in the left internal carotid artery extracranially. Intracranially reconstitution of the left internal carotid artery at the bulb into the left middle cerebral artery and the left anterior cerebral artery continued to be noted. Again a long segment abnormality of the left internal carotid artery to the petrous cavernous segment was more indicative of chronic occlusion of the left internal carotid artery with the development of collaterals as described above. Further attempts at revascularization with multiple stent reconstruction with risk of reocclusion with need for more aggressive anti-platelet treatment with risk of occlusion of the left middle cerebral artery which showed a TICI 2b revascularization. It was therefore decided to stop the procedure. A final control arteriogram performed through the 8 [place] FlowGate guide catheter in the left common carotid artery continued to demonstrate revascularization of the left internal carotid artery distal cavernous segment, and the left middle cerebral artery and the left anterior cerebral artery via the ophthalmic artery and the external collaterals as described. The 8 [place] FlowGate guide catheter was removed. The 8 French Pinnacle sheath was then also removed with the successful application of an 8 French Angio-Seal closure device for hemostasis. The left-sided common femoral artery 5 French sheath was removed with hemostasis achieved with manual compression. Both groins appeared soft without evidence of a hematoma or bleeding. Distal pulses continued to be palpable in the dorsalis pedis,  and the posterior tibial regions bilaterally unchanged. A flat panel CT of the brain demonstrated no evidence of mass effect, midline shift or of intracranial hemorrhage. The patient was then extubated without difficulty, however, the patient continued to have difficulty with expression although was able to understand simple commands. The patient moved all four extremities spontaneously and to command. He was then transferred to the neuro ICU to continue with post thrombectomy workup and management. IMPRESSION: Status post endovascular revascularization of probably chronically occluded left internal carotid artery at the bulb to the level of the petrous cavernous segment, with the left middle cerebral artery patency achieving a TICI 2b revascularization. Reocclusion of the left internal carotid artery proximally as described above. PLAN: Follow-up in the clinic 4 weeks post discharge. Electronically Signed   By: Luanne Bras M.D.   On: 03/10/2019 09:27   Ct Head Code Stroke Wo Contrast  Result Date: 03/08/2019 CLINICAL DATA:  Code stroke.  Expressive aphasia.  Slurred speech. EXAM: CT  HEAD WITHOUT CONTRAST TECHNIQUE: Contiguous axial images were obtained from the base of the skull through the vertex without intravenous contrast. COMPARISON:  None. FINDINGS: Brain: No evidence of old or acute infarction, mass lesion, hemorrhage, hydrocephalus or extra-axial collection. Vascular: Hyperdense left MCA branch, probably M2. Skull: Normal Sinuses/Orbits: Right frontal sinus opacification.  Orbits negative. Other: None ASPECTS ([place] Stroke Program Early CT Score) - Ganglionic level infarction (caudate, lentiform nuclei, internal capsule, insula, M1-M3 cortex): 7 - Supraganglionic infarction (M4-M6 cortex): 3 Total score (0-10 with 10 being normal): 10 IMPRESSION: 1. Normal appearance of the brain itself. However, there is a definite hyperdense left M2 branch consistent with an acute embolus. Right frontal  sinusitis. 2. ASPECTS is 10. 3. These results were called by telephone at the time of interpretation on 03/08/2019 at 9:26 pm to Dr. Harlow Asa, who verbally acknowledged these results. Electronically Signed   By: Nelson Chimes M.D.   On: 03/08/2019 21:31   Ir Angio Intra Extracran Sel Com Carotid Innominate Uni R Mod Sed  Result Date: 03/11/2019 INDICATION: Acute onset of aphasia. Abnormal CT angiogram of the head and neck. EXAM: 1. EMERGENT LARGE VESSEL OCCLUSION THROMBOLYSIS (anterior CIRCULATION) COMPARISON:  CT angiogram of the head and neck of March 08, 2019. MEDICATIONS: Ancef 2 g IV antibiotic was administered within 1 hour of the procedure. ANESTHESIA/SEDATION: General anesthesia. CONTRAST:  Isovue 300 approximately 100 mL. FLUOROSCOPY TIME:  Fluoroscopy Time: 49 minutes 18 seconds (617 mGy). COMPLICATIONS: None immediate. TECHNIQUE: Following a full explanation of the procedure along with the potential associated complications, an informed witnessed consent was obtained from the patient's spouse. The risks of intracranial hemorrhage of 10%, worsening neurological deficit, ventilator dependency, death and inability to revascularize were all reviewed in detail with the patient's spouse. The patient was then put under general anesthesia by the Department of Anesthesiology at [facility]. The right groin was prepped and draped in the usual sterile fashion. Thereafter using modified Seldinger technique, transfemoral access into the right common femoral artery was obtained without difficulty. Over a 0.035 inch guidewire a 5 French Pinnacle sheath was inserted. Through this, and also over a 0.035 inch guidewire a 5 [place] JB 1 catheter was advanced to the aortic arch region and selectively positioned in the left common carotid artery. Similarly a transfemoral access was obtained with the advancement of a 5 [place] JB 1 diagnostic catheter into the right common carotid artery and the right vertebral artery.  FINDINGS: The left common carotid arteriogram demonstrates the left external carotid artery and its major branches to be widely patent. The left internal carotid artery just distal to the bulb demonstrates complete angiographic occlusion with no angiographic evidence of a delayed string sign. There is reconstitution of the cavernous segment of the left internal carotid artery via the ipsilateral ophthalmic artery from collaterals arising from the lacrimal branches and the internal maxillary artery branches. Opacification is seen of the left middle cerebral artery proximally and the left anterior cerebral artery. The right common carotid arteriogram demonstrates the right external carotid artery and its major branches to be widely patent. The right internal carotid artery at the bulb to the cranial skull base demonstrates wide patency. The petrous, cavernous and the supraclinoid segments are widely patent. The right middle cerebral artery and the right anterior cerebral artery opacify into the capillary and venous phases. Cross-filling via the anterior communicating artery of the right anterior cerebral A2 segment and distally is noted. The right vertebral artery origin is slightly narrowed at  its origin. More distally the vessel is seen to opacify to the cranial skull base. Wide patency is seen of the right vertebrobasilar junction and the right posterior-inferior cerebellar artery. The basilar artery, the posterior cerebral arteries, the superior cerebellar arteries and the anterior-inferior cerebellar arteries opacify into the capillary and venous phases. Angiographically asymmetric prominence of the left posterior cerebral arteries. The delayed arterial and the capillary phase demonstrates copious leptomeningeal collaterals from the left posterior cerebral P2 segment reconstituting the left middle cerebral artery posterior frontal and anterior parietal regions. Retrograde opacification of the left pericallosal  artery to the level of the anterior 1/3 of the corpus callosum is seen. PROCEDURE: The diagnostic JB 1 catheter in the left common carotid artery was exchanged over a 0.035 inch 300 cm Rosen exchange guidewire for an 8 [place] Pinnacle sheath in the right groin which was connected to continuous heparinized saline infusion, and also of an 8 French 85 cm FlowGate balloon guide catheter which was advanced just proximal to the left common carotid bifurcation. The guidewire was removed. Good aspiration obtained from the hub of the [facility] guide catheter. A gentle control arteriogram performed again demonstrated occlusion of the left internal carotid artery at the bulb. Again seen was reconstitution in the cavernous segment. Over a 0.014 inch standard Synchro micro guidewire, an 021 130 cm Trevo ProVue microcatheter was advanced to the left internal carotid artery bulb. The distal end of the [facility] guide catheter was advanced into the bulb. The micro guidewire was then gently manipulated in order to gain access through the occluded left internal carotid artery followed by the microcatheter. Multiple attempts were made without success. The micro guidewire was then replaced with a Chikai 014 inch micro guidewire which was advanced to the distal end of the microcatheter. Again with a torque device attempts were made to advance the wire through the occluded bulb. This too was unsuccessful. This was then followed by the advancement of a 5 [place] JB 1 diagnostic catheter which was advanced to just proximal to the occluded left internal carotid bulb. An 0.035 Roadrunner guidewire was then advanced slowly with significant resistance through the occlusion. Only partial advancement of the diagnostic catheter was achievable distal to the occlusion. The guidewire was removed. Slow aspiration was obtained from the hub of the diagnostic catheter. A gentle control arteriogram performed continued to demonstrate complete occlusion of  the left internal carotid artery. This was then followed by the advancement of a Marksman 150 cm 027 microcatheter over an 024 Aristotle micro guidewire. Using a torque device, access was eventually obtained through the occluded left internal carotid artery followed by the advancement of the Marksman microcatheter through the petrous cavernous segment. The Aristotle wire was removed. Good aspiration obtained from the hub of the Marksman microcatheter. A gentle control arteriogram performed through the microcatheter demonstrated brisk flow into the distal cavernous and the supraclinoid segments. Also demonstrated was opacification of the anterior cerebral artery and the left middle cerebral artery. The dominant inferior division was seen to be widely patent. The left MCA superior division demonstrated truncated flow in the distal left M2 segment. Moderate surrounding collaterals were seen. A control arteriogram performed through the 8 [place] FlowGate guide catheter in the bulb of the left internal carotid artery continued to demonstrate opacification of the left internal carotid artery to the petrous cavernous junction and distally. The entire extracranial and the petrous segment of the left internal carotid artery continued to demonstrate significantly irregular caliber with attenuation of the caliber  itself in the entirety. No gross filling defects were seen. Focal areas of severe narrowing were interspersed with slightly improved caliber in the entirety. There appeared to be a small focal pouch at the level of the distal cervical segment. Selective infusion of a total of 3 mg of Integrilin, was given into the left internal carotid artery over a period of approximately 5 minutes. Also patient had been loaded with aspirin 81 mg p.o, and Brilinta 180 mg p.o. prior to the initial attempts at accessing the occluded left internal carotid artery. A control arteriogram performed through the [facility] guide catheter just  proximal to the left common carotid artery bifurcation now demonstrated worsening caliber with string like flow in the left internal carotid artery extracranially. Intracranially reconstitution of the left internal carotid artery at the bulb into the left middle cerebral artery and the left anterior cerebral artery continued to be noted. Again a long segment abnormality of the left internal carotid artery to the petrous cavernous segment was more indicative of chronic occlusion of the left internal carotid artery with the development of collaterals as described above. Further attempts at revascularization with multiple stent reconstruction with risk of reocclusion with need for more aggressive anti-platelet treatment with risk of occlusion of the left middle cerebral artery which showed a TICI 2b revascularization. It was therefore decided to stop the procedure. A final control arteriogram performed through the 8 [place] FlowGate guide catheter in the left common carotid artery continued to demonstrate revascularization of the left internal carotid artery distal cavernous segment, and the left middle cerebral artery and the left anterior cerebral artery via the ophthalmic artery and the external collaterals as described. The 8 [place] FlowGate guide catheter was removed. The 8 French Pinnacle sheath was then also removed with the successful application of an 8 French Angio-Seal closure device for hemostasis. The left-sided common femoral artery 5 French sheath was removed with hemostasis achieved with manual compression. Both groins appeared soft without evidence of a hematoma or bleeding. Distal pulses continued to be palpable in the dorsalis pedis, and the posterior tibial regions bilaterally unchanged. A flat panel CT of the brain demonstrated no evidence of mass effect, midline shift or of intracranial hemorrhage. The patient was then extubated without difficulty, however, the patient continued to have difficulty  with expression although was able to understand simple commands. The patient moved all four extremities spontaneously and to command. He was then transferred to the neuro ICU to continue with post thrombectomy workup and management. IMPRESSION: Status post endovascular revascularization of probably chronically occluded left internal carotid artery at the bulb to the level of the petrous cavernous segment, with the left middle cerebral artery patency achieving a TICI 2b revascularization. Reocclusion of the left internal carotid artery proximally as described above. PLAN: Follow-up in the clinic 4 weeks post discharge. Electronically Signed   By: Luanne Bras M.D.   On: 03/10/2019 09:27   Ir Angio Vertebral Sel Vertebral Uni R Mod Sed  Result Date: 03/11/2019 INDICATION: Acute onset of aphasia. Abnormal CT angiogram of the head and neck. EXAM: 1. EMERGENT LARGE VESSEL OCCLUSION THROMBOLYSIS (anterior CIRCULATION) COMPARISON:  CT angiogram of the head and neck of March 08, 2019. MEDICATIONS: Ancef 2 g IV antibiotic was administered within 1 hour of the procedure. ANESTHESIA/SEDATION: General anesthesia. CONTRAST:  Isovue 300 approximately 100 mL. FLUOROSCOPY TIME:  Fluoroscopy Time: 49 minutes 18 seconds (617 mGy). COMPLICATIONS: None immediate. TECHNIQUE: Following a full explanation of the procedure along with the potential associated  complications, an informed witnessed consent was obtained from the patient's spouse. The risks of intracranial hemorrhage of 10%, worsening neurological deficit, ventilator dependency, death and inability to revascularize were all reviewed in detail with the patient's spouse. The patient was then put under general anesthesia by the Department of Anesthesiology at [facility]. The right groin was prepped and draped in the usual sterile fashion. Thereafter using modified Seldinger technique, transfemoral access into the right common femoral artery was obtained without  difficulty. Over a 0.035 inch guidewire a 5 French Pinnacle sheath was inserted. Through this, and also over a 0.035 inch guidewire a 5 [place] JB 1 catheter was advanced to the aortic arch region and selectively positioned in the left common carotid artery. Similarly a transfemoral access was obtained with the advancement of a 5 [place] JB 1 diagnostic catheter into the right common carotid artery and the right vertebral artery. FINDINGS: The left common carotid arteriogram demonstrates the left external carotid artery and its major branches to be widely patent. The left internal carotid artery just distal to the bulb demonstrates complete angiographic occlusion with no angiographic evidence of a delayed string sign. There is reconstitution of the cavernous segment of the left internal carotid artery via the ipsilateral ophthalmic artery from collaterals arising from the lacrimal branches and the internal maxillary artery branches. Opacification is seen of the left middle cerebral artery proximally and the left anterior cerebral artery. The right common carotid arteriogram demonstrates the right external carotid artery and its major branches to be widely patent. The right internal carotid artery at the bulb to the cranial skull base demonstrates wide patency. The petrous, cavernous and the supraclinoid segments are widely patent. The right middle cerebral artery and the right anterior cerebral artery opacify into the capillary and venous phases. Cross-filling via the anterior communicating artery of the right anterior cerebral A2 segment and distally is noted. The right vertebral artery origin is slightly narrowed at its origin. More distally the vessel is seen to opacify to the cranial skull base. Wide patency is seen of the right vertebrobasilar junction and the right posterior-inferior cerebellar artery. The basilar artery, the posterior cerebral arteries, the superior cerebellar arteries and the anterior-inferior  cerebellar arteries opacify into the capillary and venous phases. Angiographically asymmetric prominence of the left posterior cerebral arteries. The delayed arterial and the capillary phase demonstrates copious leptomeningeal collaterals from the left posterior cerebral P2 segment reconstituting the left middle cerebral artery posterior frontal and anterior parietal regions. Retrograde opacification of the left pericallosal artery to the level of the anterior 1/3 of the corpus callosum is seen. PROCEDURE: The diagnostic JB 1 catheter in the left common carotid artery was exchanged over a 0.035 inch 300 cm Rosen exchange guidewire for an 8 [place] Pinnacle sheath in the right groin which was connected to continuous heparinized saline infusion, and also of an 8 French 85 cm FlowGate balloon guide catheter which was advanced just proximal to the left common carotid bifurcation. The guidewire was removed. Good aspiration obtained from the hub of the [facility] guide catheter. A gentle control arteriogram performed again demonstrated occlusion of the left internal carotid artery at the bulb. Again seen was reconstitution in the cavernous segment. Over a 0.014 inch standard Synchro micro guidewire, an 021 130 cm Trevo ProVue microcatheter was advanced to the left internal carotid artery bulb. The distal end of the [facility] guide catheter was advanced into the bulb. The micro guidewire was then gently manipulated in order to gain access  through the occluded left internal carotid artery followed by the microcatheter. Multiple attempts were made without success. The micro guidewire was then replaced with a Chikai 014 inch micro guidewire which was advanced to the distal end of the microcatheter. Again with a torque device attempts were made to advance the wire through the occluded bulb. This too was unsuccessful. This was then followed by the advancement of a 5 [place] JB 1 diagnostic catheter which was advanced to just  proximal to the occluded left internal carotid bulb. An 0.035 Roadrunner guidewire was then advanced slowly with significant resistance through the occlusion. Only partial advancement of the diagnostic catheter was achievable distal to the occlusion. The guidewire was removed. Slow aspiration was obtained from the hub of the diagnostic catheter. A gentle control arteriogram performed continued to demonstrate complete occlusion of the left internal carotid artery. This was then followed by the advancement of a Marksman 150 cm 027 microcatheter over an 024 Aristotle micro guidewire. Using a torque device, access was eventually obtained through the occluded left internal carotid artery followed by the advancement of the Marksman microcatheter through the petrous cavernous segment. The Aristotle wire was removed. Good aspiration obtained from the hub of the Marksman microcatheter. A gentle control arteriogram performed through the microcatheter demonstrated brisk flow into the distal cavernous and the supraclinoid segments. Also demonstrated was opacification of the anterior cerebral artery and the left middle cerebral artery. The dominant inferior division was seen to be widely patent. The left MCA superior division demonstrated truncated flow in the distal left M2 segment. Moderate surrounding collaterals were seen. A control arteriogram performed through the 8 [place] FlowGate guide catheter in the bulb of the left internal carotid artery continued to demonstrate opacification of the left internal carotid artery to the petrous cavernous junction and distally. The entire extracranial and the petrous segment of the left internal carotid artery continued to demonstrate significantly irregular caliber with attenuation of the caliber itself in the entirety. No gross filling defects were seen. Focal areas of severe narrowing were interspersed with slightly improved caliber in the entirety. There appeared to be a small focal  pouch at the level of the distal cervical segment. Selective infusion of a total of 3 mg of Integrilin, was given into the left internal carotid artery over a period of approximately 5 minutes. Also patient had been loaded with aspirin 81 mg p.o, and Brilinta 180 mg p.o. prior to the initial attempts at accessing the occluded left internal carotid artery. A control arteriogram performed through the [facility] guide catheter just proximal to the left common carotid artery bifurcation now demonstrated worsening caliber with string like flow in the left internal carotid artery extracranially. Intracranially reconstitution of the left internal carotid artery at the bulb into the left middle cerebral artery and the left anterior cerebral artery continued to be noted. Again a long segment abnormality of the left internal carotid artery to the petrous cavernous segment was more indicative of chronic occlusion of the left internal carotid artery with the development of collaterals as described above. Further attempts at revascularization with multiple stent reconstruction with risk of reocclusion with need for more aggressive anti-platelet treatment with risk of occlusion of the left middle cerebral artery which showed a TICI 2b revascularization. It was therefore decided to stop the procedure. A final control arteriogram performed through the 8 [place] FlowGate guide catheter in the left common carotid artery continued to demonstrate revascularization of the left internal carotid artery distal cavernous segment, and the  left middle cerebral artery and the left anterior cerebral artery via the ophthalmic artery and the external collaterals as described. The 8 [place] FlowGate guide catheter was removed. The 8 French Pinnacle sheath was then also removed with the successful application of an 8 French Angio-Seal closure device for hemostasis. The left-sided common femoral artery 5 French sheath was removed with hemostasis  achieved with manual compression. Both groins appeared soft without evidence of a hematoma or bleeding. Distal pulses continued to be palpable in the dorsalis pedis, and the posterior tibial regions bilaterally unchanged. A flat panel CT of the brain demonstrated no evidence of mass effect, midline shift or of intracranial hemorrhage. The patient was then extubated without difficulty, however, the patient continued to have difficulty with expression although was able to understand simple commands. The patient moved all four extremities spontaneously and to command. He was then transferred to the neuro ICU to continue with post thrombectomy workup and management. IMPRESSION: Status post endovascular revascularization of probably chronically occluded left internal carotid artery at the bulb to the level of the petrous cavernous segment, with the left middle cerebral artery patency achieving a TICI 2b revascularization. Reocclusion of the left internal carotid artery proximally as described above. PLAN: Follow-up in the clinic 4 weeks post discharge. Electronically Signed   By: Luanne Bras M.D.   On: 03/10/2019 09:27    Labs:  CBC: Recent Labs    03/09/19 0656 03/10/19 0545 03/11/19 0809 03/12/19 0321  WBC 5.0 4.7 4.6 3.8*  HGB 12.5* 10.8* 9.5* 9.4*  HCT 36.5* 31.2* 27.5* 26.7*  PLT 125* 140* 146* 152    COAGS: Recent Labs    11/29/18 2109 03/09/19 0215  03/09/19 2127 03/10/19 0545 03/10/19 1235 03/10/19 2034  INR 1.1 1.3*  --   --   --   --   --   APTT  --  >200*   < > 55* 51* 25 88*   < > = values in this interval not displayed.    BMP: Recent Labs    03/05/19 0944 03/08/19 0906 03/09/19 0656 03/12/19 0321  NA 137 136 139 137  K 4.9 4.1 3.8 3.3*  CL 101 102 104 106  CO2 28 27 25 22   GLUCOSE 110* 135* 98 130*  BUN 14 10 7* 6*  CALCIUM 9.0 8.5* 8.4* 8.0*  CREATININE 0.70 0.68 0.70 0.47*  GFRNONAA >60 >60 >60 >60  GFRAA >60 >60 >60 >60    LIVER FUNCTION  TESTS: Recent Labs    09/23/18 0948 09/24/18 0153 11/29/18 1647  BILITOT 0.7 1.0 0.6  AST 18 14* 13*  ALT 12 10 13   ALKPHOS 61 51 73  PROT 7.3 5.9* 7.5  ALBUMIN 3.3* 2.5* 3.0*    Assessment and Plan:  Acute CVA (left MCA infarct) s/p diagnostic cerebral arteriogram with attempted revascularization of occluded proximal left ICA achieving a TICI 2b to 2c revascularization 03/09/2019 by Dr. Estanislado Pandy. Patient's condition stable- can spontaneously move all extremities, still with dysarthria (improved) and mild right facial droop. Right groin (access site) marked with approximately 3-4 cm hardness with surrounding ecchymosis- will order vas [place] to evaluate ?possible hematoma vs pseudoaneurysm; left groin (attempted access site) incision stable- distal pulses intact bilaterally. Further plans per CCS/neurology- appreciate and agree with management. NIR to follow.   Electronically Signed: Earley Abide, PA-C 03/12/2019, 9:46 AM   I spent a total of 25 Minutes at the the patient's bedside AND on the patient's hospital floor or unit, greater than 50%  of which was counseling/coordinating care for acute CVA.

## 2019-03-13 ENCOUNTER — Inpatient Hospital Stay (HOSPITAL_COMMUNITY): Payer: BC Managed Care – PPO

## 2019-03-13 DIAGNOSIS — I724 Aneurysm of artery of lower extremity: Secondary | ICD-10-CM

## 2019-03-13 LAB — CBC
HCT: 26.9 % — ABNORMAL LOW (ref 39.0–52.0)
Hemoglobin: 9.4 g/dL — ABNORMAL LOW (ref 13.0–17.0)
MCH: 30.8 pg (ref 26.0–34.0)
MCHC: 34.9 g/dL (ref 30.0–36.0)
MCV: 88.2 fL (ref 80.0–100.0)
Platelets: 141 10*3/uL — ABNORMAL LOW (ref 150–400)
RBC: 3.05 MIL/uL — ABNORMAL LOW (ref 4.22–5.81)
RDW: 15.4 % (ref 11.5–15.5)
WBC: 3.6 10*3/uL — ABNORMAL LOW (ref 4.0–10.5)
nRBC: 0 % (ref 0.0–0.2)

## 2019-03-13 LAB — HEPARIN LEVEL (UNFRACTIONATED)
Heparin Unfractionated: 0.37 IU/mL (ref 0.30–0.70)
Heparin Unfractionated: 0.36 IU/mL (ref 0.30–0.70)

## 2019-03-13 NOTE — Progress Notes (Addendum)
Adrian [name] [id number] 18-Aug-1955  CARE TEAM:  PCP: [name], MD  Outpatient Care Team: Patient Care Team: [name], MD as PCP - General (Family Medicine) Sueanne Margarita, MD as PCP - Cardiology (Cardiology) Sueanne Margarita, MD as Consulting Physician (Cardiology) Michael Boston, MD as Consulting Physician (General Surgery) Irene Shipper, MD as Consulting Physician (Gastroenterology) Izora Gala, MD as Consulting Physician (Otolaryngology) Garvin Fila, MD as Consulting Physician (Neurology)  Inpatient Treatment Team: Treatment Team: Attending Provider: Michael Boston, MD; Rounding Team: Stroke, Md, MD; Registered Nurse: Fortino Sic, RN; Technician: Wylene Men, [place]; Consulting Physician: Michael Boston, MD; Consulting Physician: Edison Pace, Md, MD; Rounding Team: [name] Radiology, MD; Case Manager: Reinaldo Raddle, RN; Consulting Physician: Rosetta Posner, MD; Utilization Review: Sindy Guadeloupe, RN   Problem List:   Principal Problem:   Middle cerebral artery embolism, left Active Problems:   Expressive aphasia   NICM (nonischemic cardiomyopathy) ([place])   Diverticulitis of large intestine with abscess   History of radiation therapy   Smokes tobacco daily   Hypothyroidism   CAD (coronary artery disease), native coronary artery   Chronic atrial fibrillation   Current use of long term anticoagulation   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 MCA revascularization via collaterals TICI 2b to 2C .  Assessment  Stabilizing status post stroke cerebral artery with expressive aphasia & dysphagia.  Pseudoaneurysm at arterial puncture site right side treated with thrombin injection  Neurology, Neuroradiology, ICU, RN, Code Stroke teams help appreciated  [facility] Stay = 6 days)  Plan:  -Right groin swelling concerning aneurysm.  Thrombin injection done vascular surgery supervision.  Not worsening on full anticoagulation.  S.  Hopefully can mobilize soon if vascular and radiology feels it is stable now.  -Continue rehab in the hopes that aphasia and dysphasia will continue to improve this hospitalization.    --pathology -consistent with diverticulitis.  Benign.  Discussed with patient. OK to d/c o/w from surgery standpoint  -bowel functioning - OK to adv diet as tolerated to solids once speech Tx determines safest course.  Neurology discussed about repeating swallow study in 3 or 4 days which would be today.  Awaiting to hear their recommendations.  Assuming Dys1 to Dys3 evetually  -full gtt anticoagulation  -Expressive aphasia persists but slightly improving. - agree w speech therapy, PT/OT, anticoagulation  -hypokalemia - replaced  -VTE prophylaxis- SCDs, etc  -mobilize as tolerated to help recovery  20 minutes spent in review, evaluation, examination, counseling, and coordination of care.  More than 50% of that time was spent in counseling.  03/13/2019    Subjective: (Chief complaint)  Concern for increased redness and swelling.  Neurology vascular surgery consulted.  Pseudoaneurysm detected and injected with thrombin.  No worsening progression at this time.   On tube feeds.  Seems more alert.  Awaiting feedback from neurology and speech therapy when okay to repeat swallowing study    Objective:  Vital signs:  Vitals:   03/13/19 0300 03/13/19 0400 03/13/19  0500  03/13/19 0600  BP: (!) 94/53 (!) 110/50 (!) 104/54 108/61  Pulse:  (!) 58 (!) 58 62  Resp: 18 20 19 19   Temp:  98.7 F (37.1 C)    TempSrc:  Oral    SpO2:  96% (!) 71% 96%  Weight:      Height:        Last BM Date: 03/12/19  Intake/Output   Yesterday:  08/05 0701 - 08/06 0700 In: 1378.1 [I.V.:1138.1; NG/GT:240] Out: 850 [Urine:850] This shift:  Total I/O In: 600.2 [I.V.:600.2] Out: 550 [Urine:550]  Bowel function:  Flatus: YES  BM:  YES  Drain: (No drain)   Physical Exam:  General: Pt awake/alert/oriented x4 in no acute distress Eyes: PERRL, normal EOM.  Sclera clear.  No icterus  Neuro: CN II-XII intact w/o focal sensory/motor deficits.  Expressive aphasia more obvious after longer conversations but definitely improved since Monday.    Lymph: No head/neck/groin lymphadenopathy Psych:  No delerium/psychosis/paranoia HENT: Normocephalic, Mucus membranes moist.  No thrush Neck: Supple, No tracheal deviation Chest: No chest wall pain w good excursion CV:  Pulses intact.  Regular rhythm MS: Normal AROM mjr joints.  No obvious deformity  Abdomen: Soft.  Nondistended.  Nontender.  Incisions w normal healing ridges. Nontender. No cellulitis or abscess.  No evidence of peritonitis.  No incarcerated hernias.  Right groin more flat with moderate ecchymosis.  No oozing/bleeding Ext:   No deformity.  No mjr edema.  No cyanosis Skin: No petechiae / purpura  Results:   Cultures: Recent Results (from the past 720 hour(s))  SARS Coronavirus 2 (Performed in [place] hospital lab)     Status: None   Collection Time: 03/04/19  9:47 AM   Specimen: Nasal Swab  Result Value [place] Status   SARS Coronavirus 2 NEGATIVE NEGATIVE Final    Comment: (NOTE) SARS-CoV-2 target nucleic acids are NOT DETECTED. The SARS-CoV-2 RNA is generally detectable in upper and lower respiratory specimens during the acute phase of infection. Negative results do not preclude SARS-CoV-2  infection, do not rule out co-infections with other pathogens, and should not be used as the sole basis for treatment or other patient management decisions. Negative results must be combined with clinical observations, patient history, and epidemiological information. The expected result is Negative. Fact Sheet for Patients: SugarRoll.be Fact Sheet for Healthcare Providers: https://www.woods-mathews.com/ This test is not yet approved or cleared by the [place] FDA and  has been authorized for detection and/or diagnosis of SARS-CoV-2 by FDA under an Emergency Use Authorization (EUA). This EUA will remain  in effect (meaning this test can be used) for the duration of the COVID-19 declaration under Section 56 4(b)(1) of the Act, 21 U.S.C. section 360bbb-3(b)(1), unless the authorization is terminated or revoked sooner. Performed at [facility], [place] [address] [place], [place] [address]   MRSA PCR Screening     Status: None   Collection Time: 03/08/19 10:44 PM   Specimen: Nasal Mucosa; Nasopharyngeal  Result Value Ref [place] Status   MRSA by PCR NEGATIVE NEGATIVE Final    Comment:        The GeneXpert MRSA Assay (FDA approved for NASAL specimens only), is one component of a comprehensive MRSA colonization surveillance program. It is not intended to diagnose MRSA infection nor to guide or monitor treatment for MRSA infections. Performed at [facility], [place] [address] [place], [place] [address]     Labs: Results for orders placed or performed during  the hospital encounter of 03/07/19 (from the past 48 hour(s))  CBC     Status: Abnormal   Collection Time: 03/11/19  8:09 AM  Result Value Ref [place]   WBC 4.6 4.0 - 10.5 K/uL   RBC 3.10 (L) 4.22 - 5.81 MIL/uL   Hemoglobin 9.5 (L) 13.0 - 17.0 g/dL   HCT 27.5 (L) 39.0 - 52.0 %   MCV 88.7 80.0 - 100.0 fL   MCH 30.6 26.0 - 34.0 pg   MCHC 34.5 30.0 - 36.0 g/dL   RDW 15.7  (H) 11.5 - 15.5 %   Platelets 146 (L) 150 - 400 K/uL   nRBC 0.0 0.0 - 0.2 %    Comment: Performed at [facility], [place] [address], [place], [place] [address]  Heparin level (unfractionated)     Status: None   Collection Time: 03/11/19  8:09 AM  Result Value Ref [place]   Heparin Unfractionated 0.31 0.30 - 0.70 IU/mL    Comment: (NOTE) If heparin results are below expected values, and patient dosage has  been confirmed, suggest follow up testing of antithrombin III levels. Performed at [facility], [place] [address], [place], [place] [address]   CBC     Status: Abnormal   Collection Time: 03/12/19  3:21 AM  Result Value Ref [place]   WBC 3.8 (L) 4.0 - 10.5 K/uL   RBC 3.02 (L) 4.22 - 5.81 MIL/uL   Hemoglobin 9.4 (L) 13.0 - 17.0 g/dL   HCT 26.7 (L) 39.0 - 52.0 %   MCV 88.4 80.0 - 100.0 fL   MCH 31.1 26.0 - 34.0 pg   MCHC 35.2 30.0 - 36.0 g/dL   RDW 15.4 11.5 - 15.5 %   Platelets 152 150 - 400 K/uL   nRBC 0.5 (H) 0.0 - 0.2 %    Comment: Performed at [facility], [place] [address], [place], [place] [address]  Heparin level (unfractionated)     Status: Abnormal   Collection Time: 03/12/19  3:21 AM  Result Value Ref [place]   Heparin Unfractionated 0.24 (L) 0.30 - 0.70 IU/mL    Comment: (NOTE) If heparin results are below expected values, and patient dosage has  been confirmed, suggest follow up testing of antithrombin III levels. Performed at [facility], [place] [address], [place], [place] [address]   Basic metabolic panel     Status: Abnormal   Collection Time: 03/12/19  3:21 AM  Result Value Ref [place]   Sodium 137 135 - 145 mmol/L   Potassium 3.3 (L) 3.5 - 5.1 mmol/L   Chloride 106 98 - 111 mmol/L   CO2 22 22 - 32 mmol/L   Glucose, Bld 130 (H) 70 - 99 mg/dL   BUN 6 (L) 8 - 23 mg/dL   Creatinine, Ser 0.47 (L) 0.61 - 1.24 mg/dL   Calcium 8.0 (L) 8.9 - 10.3 mg/dL   GFR calc non Af Amer >60 >60 mL/min   GFR calc Af Amer >60 >60 mL/min   Anion gap 9 5 - 15     Comment: Performed at [place] [address] [place], [place] [address]  Magnesium     Status: None   Collection Time: 03/12/19  3:21 AM  Result Value Ref [place]   Magnesium 1.8 1.7 - 2.4 mg/dL    Comment: Performed at [place] [address] [place], [place] [address]  Heparin level (unfractionated)     Status: Abnormal  Collection Time: 03/12/19  9:37 PM  Result Value Ref [place]   Heparin Unfractionated 0.16 (L) 0.30 - 0.70 IU/mL    Comment: (NOTE) If heparin results are below expected values, and patient dosage has  been confirmed, suggest follow up testing of antithrombin III levels. Performed at [facility], [place] [address] [place], [place] [address]   CBC     Status: Abnormal   Collection Time: 03/13/19  6:09 AM  Result Value Ref [place]   WBC 3.6 (L) 4.0 - 10.5 K/uL   RBC 3.05 (L) 4.22 - 5.81 MIL/uL   Hemoglobin 9.4 (L) 13.0 - 17.0 g/dL   HCT 26.9 (L) 39.0 - 52.0 %   MCV 88.2 80.0 - 100.0 fL   MCH 30.8 26.0 - 34.0 pg   MCHC 34.9 30.0 - 36.0 g/dL   RDW 15.4 11.5 - 15.5 %   Platelets 141 (L) 150 - 400 K/uL   nRBC 0.0 0.0 - 0.2 %    Comment: Performed at [facility], [place] [address], [place], [place] [address]    Imaging / Studies: Vas [place] Groin Pseudoaneurysm  Result Date: 03/12/2019  ARTERIAL PSEUDOANEURYSM  Exam: Right groin Indications: Patient complains of palpable knot. History: Acute CVA (left MCA infarct) s/p diagnostic cerebral arteriogram with attempted revascularization of occluded proximal left ICA achieving a TICI 2b to 2c revascularization 03/09/2019. Comparison Study: No prior studies. Performing Technologist: Oliver Hum RVT  Examination Guidelines: A complete evaluation includes B-mode imaging, spectral Doppler, color Doppler, and power Doppler as needed of all accessible portions of each vessel. Bilateral testing is considered an integral part of a complete examination. Limited examinations for reoccurring indications may be  performed as noted. +------------+----------+---------+------+----------+ Right DuplexPSV (cm/s)Waveform PlaqueComment(s) +------------+----------+---------+------+----------+ CFA            144    triphasic                 +------------+----------+---------+------+----------+ PFA             82    triphasic                 +------------+----------+---------+------+----------+ Prox SFA        99    triphasic                 +------------+----------+---------+------+----------+  Findings: An area with well defined borders was visualized arising off of the CFA with ultrasound characteristics of a pseudoaneurysm. The neck measures approximately 0.5 cm wide and 0.4 cm long. Multi-chambered pseudoaneurysm with a neck that measures 0.43 cm long and 0.5 cm wide.  Diagnosing physician: Curt Jews MD Electronically signed by Curt Jews MD on 03/12/2019 at 11:08:21 AM.   --------------------------------------------------------------------------------    Final    Vas [place] Lower Ext Arterial Pseudo Injection  Result Date: 03/12/2019  ARTERIAL PSEUDOANEURYSM  Indications: Patient complains of palpable knot. Performing Technologist: Oliver Hum RVT  Examination Guidelines: A complete evaluation includes B-mode imaging, spectral Doppler, color Doppler, and power Doppler as needed of all accessible portions of each vessel. Bilateral testing is considered an integral part of a complete examination. Limited examinations for reoccurring indications may be performed as noted.  Findings: An area was visualized with ultrasound characteristics of a pseudoaneurysm. The neck measures approximately 0.5 cm wide and 0.4 cm long.  Summary: Successful thrombin injection with closure of the right common femoral artery pseudoaneurysm performed by Dr. Donnetta Hutching and Dr. Carlis Abbott. Post injection, distal arterial flow demonstrates patent, triphasic waveforms.    --------------------------------------------------------------------------------  Preliminary     Medications / Allergies: per chart  Antibiotics: Anti-infectives (From admission, onward)   Start     Dose/Rate Route Frequency Ordered Stop   03/09/19 0035  ceFAZolin (ANCEF) 2-4 GM/100ML-% IVPB    Note to Pharmacy: Luis Abed   : cabinet override      03/09/19 0035 03/09/19 1244   03/07/19 2200  cefoTEtan (CEFOTAN) 2 g in sodium chloride 0.9 % 100 mL IVPB     2 g 200 mL/hr over 30 Minutes Intravenous Every 12 hours 03/07/19 1700 03/07/19 2210   03/07/19 1434  clindamycin (CLEOCIN) 900 mg, gentamicin (GARAMYCIN) 240 mg in sodium chloride 0.9 % 1,000 mL for intraperitoneal lavage  Status:  Discontinued       As needed 03/07/19 1434 03/07/19 1456   03/07/19 1400  neomycin (MYCIFRADIN) tablet 1,000 mg  Status:  Discontinued     1,000 mg Oral 3 times per day 03/07/19 1026 03/07/19 1641   03/07/19 1400  metroNIDAZOLE (FLAGYL) tablet 1,000 mg  Status:  Discontinued     1,000 mg Oral 3 times per day 03/07/19 1026 03/07/19 1641   03/07/19 1030  cefoTEtan (CEFOTAN) 2 g in sodium chloride 0.9 % 100 mL IVPB     2 g 200 mL/hr over 30 Minutes Intravenous On call to O.R. 03/07/19 1026 03/07/19 1228   03/07/19 0600  clindamycin (CLEOCIN) 900 mg, gentamicin (GARAMYCIN) 240 mg in sodium chloride 0.9 % 1,000 mL for intraperitoneal lavage  Status:  Discontinued      Irrigation To Surgery 03/06/19 1018 03/07/19 1641        Note: Portions of this report may have been transcribed using voice recognition software. Every effort was made to ensure accuracy; however, inadvertent computerized transcription errors may be present.   Any transcriptional errors that result from this process are unintentional.     [name], MD, FACS, MASCRS Gastrointestinal and Minimally Invasive Surgery    [address], [place], [place] [address] [phone] Main / Paging [phone] Fax

## 2019-03-13 NOTE — Progress Notes (Addendum)
STROKE TEAM PROGRESS NOTE   SUBJECTIVE (INTERVAL HISTORY) Patient is stable. From neuro perspective ok to restart Eliquis however if may need gtube may want to hold off until procedure per primary team. Pending MBS.   OBJECTIVE Vitals:   03/13/19 0500 03/13/19 0600 03/13/19 0700 03/13/19 0800  BP: (!) 104/54 108/61  118/68  Pulse: (!) 58 62 65 (!) 59  Resp: 19 19 (!) 22 17  Temp:    98.6 F (37 C)  TempSrc:    Oral  SpO2: (!) 71% 96% 99% 96%  Weight:      Height:        CBC:  Recent Labs  Lab 03/09/19 0656  03/12/19 0321 03/13/19 0609  WBC 5.0   < > 3.8* 3.6*  NEUTROABS 0.9*  --   --   --   HGB 12.5*   < > 9.4* 9.4*  HCT 36.5*   < > 26.7* 26.9*  MCV 91.9   < > 88.4 88.2  PLT 125*   < > 152 141*   < > = values in this interval not displayed.    Basic Metabolic Panel:  Recent Labs  Lab 03/08/19 0906 03/09/19 0656 03/12/19 0321  NA 136 139 137  K 4.1 3.8 3.3*  CL 102 104 106  CO2 27 25 22   GLUCOSE 135* 98 130*  BUN 10 7* 6*  CREATININE 0.68 0.70 0.47*  CALCIUM 8.5* 8.4* 8.0*  MG 1.8  --  1.8    Lipid Panel:     Component Value Date/Time   CHOL 110 03/09/2019 0656   CHOL 159 03/09/2017 1032   TRIG 97 03/09/2019 0656   HDL 45 03/09/2019 0656   HDL 61 03/09/2017 1032   CHOLHDL 2.4 03/09/2019 0656   VLDL 19 03/09/2019 0656   LDLCALC 46 03/09/2019 0656   LDLCALC 66 03/09/2017 1032   HgbA1c:  Lab Results  Component Value Date   HGBA1C 5.5 03/09/2019    IMAGING Ct Head Code Stroke Wo Contrast 03/08/2019 1. Normal appearance of the brain itself. However, there is a definite hyperdense left M2 branch consistent with an acute embolus. Right frontal sinusitis.  2. ASPECTS is 10.   Ct Angio Head W Or Wo Contrast Ct Angio Neck W Or Wo Contrast 03/08/2019 Acute occlusion of the left ICA at its origin. Reconstitution of the ICA by collaterals at the skull base level. Occluded left M2 branch.  Ct Cerebral Perfusion W Contrast 03/08/2019 Acute core infarct  involving the anterior left MCA distribution with moderate surrounding ischemic penumbra as above.   Interventional Radiology 03/09/2019 S/P bilateral common carotid and RT vertbral arteriograms followed by attempted revascularization of occluded Lt ICA prox . MCA revascularization via collaterals TICI 2b to 2C .  MRI Head WO Contrast  03/09/2019 1. Acute Left MCA infarct involving the middle/anterior division territory similar to the CTP core estimate. Trace petechial hemorrhage (Heidelberg Classification 1a). No mass effect. 2. Asymmetrically decreased Left ICA petrous segment flow void, but otherwise preserved major vascular flow voids. 3. Indeterminate abnormal marrow signal in the clivus, which had an unremarkable CT appearance yesterday. Recommend follow-up Two-view Chest Radiographs when possible, and if negative a 3 month follow-up noncontrast Brain MRI to re-evaluate the clivus.   Transthoracic Echocardiogram  8/2//2020  1. The left ventricle has normal systolic function with an ejection fraction of 60-65%. The cavity size was normal. Left ventricular diastolic parameters were normal.  2. The right ventricle has normal systolic function. The cavity was  normal. There is no increase in right ventricular wall thickness.  3. No evidence of mitral valve stenosis.  4. The tricuspid valve is grossly normal.  5. No stenosis of the aortic valve.  6. The aorta is normal in size and structure.  7. The aortic root and ascending aorta are normal in size and structure.  8. The interatrial septum was not assessed.  Vas [place] Lower Ext Arterial Pseudo Injection 03/12/2019 Findings: An area was visualized with ultrasound characteristics of a pseudoaneurysm. The neck measures approximately 0.5 cm wide and 0.4 cm long.  Summary: Successful thrombin injection with closure of the right common femoral artery pseudoaneurysm performed by Dr. Donnetta Hutching and Dr. Carlis Abbott. Post injection, distal arterial flow demonstrates  patent, triphasic waveforms.      PHYSICAL EXAM Pleasant middle-aged Caucasian male not in distress. . Afebrile. Head is nontraumatic. Neck is supple without bruit.    Cardiac exam no murmur or gallop. Lungs are clear to auscultation. Distal pulses are well felt.  Right groin pulsatile swelling mildly tender to touch. Neurological Exam :  Awake alert oriented to time place and person.  Moderate expressive aphasia with word finding difficulties and paraphasic errors.  Able to speak occasional short sentences.  Good comprehension and follows commands well.  Poor naming and repetition.  Dysarthria present.    Mild right lower facial weakness.  Tongue midline.  Extraocular movements full range without nystagmus.  Blinks to threat bilaterally.  Motor system exam symmetric upper and lower extremity strength no focal weakness.  Fine finger movements are slightly diminished on the right.  LT intack bilat.  Gait not tested.   ASSESSMENT/PLAN Adrian [name] is a 63 y.o. male with history of coronary artery disease, ongoing tobacco use, hx of NICM, tonsillar ca in remission, atrial fibrillation on anticoagulation which was held for surgery since last Tuesday - [facility] 0n 03/06/19 - diverticulitis of the large intestine with abscess requiring exploratory laparotomy, low anterior resection rigid proctoscopy, appendectomy. Post op the pt developed speech difficulties. He did not receive IV t-PA due to recent surgery. He was transferred to [facility] where he went to IR with TICI 2b revascularization of the L MCA, w/ attempted revascularization of the prox L ICA.  Stroke:  Left MCA infarct embolic d/t known AF off AC for recent surgery s/p IR with attempted revascularization occluded left ICA and partial revascularization L MCA  CT head - Normal appearance of the brain itself. However, there is a definite hyperdense left M2 branch consistent with an acute embolus  CTA H&N - Acute occlusion of the left ICA at its  origin. Reconstitution of the ICA by collaterals at the skull base level. Occluded left M2 branch.  CT Perfusion - Acute core infarct involving the anterior left MCA distribution with moderate surrounding ischemic penumbra   Cerebral angio occluded L ICA w/ attempted revascularization. MCA revascularization per collaterals TICI 2b.  Post IR CT no ICH or mass effect  MRI head - L MCA infarct, trace petechial hmg. Decreased L ICA flow void. indeterminate abnormal marrow signal in the clivius. Check view CXR and iof neg a 3-mo f/u noncontrast MRI to re-eval the clivus  2D Echo  - EF 60 - 65%. No cardiac source of emboli identified.   [place] Corona Virus 2 - negative  LDL - 46  HgbA1c - 5.5  VTE prophylaxis - Lovenox  Eliquis (apixaban) daily prior to admission, now on heparin IV. Ok to start oral AC from surgeon's standpoint;  not yet cleared to swallow.  From neuro perspective ok to restart Eliquis however if may need gtube may want to hold off until procedure per primary team. Stroke will sign off.  Therapy recommendations:  OP PT, OT, SLP->CIR  Disposition:  Return home  Atrial Fibrillation  Home anticoagulation:  Eliquis (apixaban) daily - on hold for recent surgery  Now on IV heparin  Ok for [facility] per surgeon but unable to swallow  Plan AC once able to take pos   ICA occlusion  Chronically occluded L ICA  Attempted revascxularization  Hypertension  Stable . On phenylephrine - weaning . BP goal per IR 24h post IR  . BP goal now  SBP 120-140 . Long-term BP goal normotensive  Hyperlipidemia  Lipid lowering medication PTA:  Lipitor 40 mg daily  LDL 46, goal < 70  Current lipid lowering medication:  Lipitor 40 mg daily  Continue statin at discharge  Dysphagia . Secondary to stroke . NPO . Failed MBS . Cortrak placed w/ tube feedings . Surgeon has okayed solids once able to swallow . Swallow impacted by prior ACDF and radiation tx to tonsils. Had PEG at that  time. . Hope for improvement over the next few days.   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  Coronary artery disease  Nonischemic CM  Other Active Problems  Mild Thrombocytopenia, resolved - 125...->141  S/P exploratory laparotomy, low anterior resection rigid proctoscopy, appendectomy 03/06/19 - path c/w diverticulitis. benign  CBC w/ atypical lymphocytes  History of tonsillar cancer treated with radiation and cisplatin and 2005. Had a G-tube placed at the time.  Hypokalemia 3.3 supplemented  Hospital day # 6  Patient is stable. From neuro perspective ok to restart Eliquis however if may need gtube may want to hold off until procedure per primary team. Pending MBS.   Personally examined patient and images, and have participated in and made any corrections needed to history, physical, neuro exam,assessment and plan as stated above.  I have personally obtained the history, evaluated lab date, reviewed imaging studies and agree with radiology interpretations.    Sarina Ill, MD Stroke Neurology   A total of 25 minutes was spent for the care of this patient, spent on counseling patient and family on different diagnostic and therapeutic options, counseling and coordination of care, riskd ans benefits of management, compliance, or risk factor reduction and education.   To contact Stroke Continuity provider, please refer to [email or web address] After hours, contact General Neurology

## 2019-03-13 NOTE — Progress Notes (Signed)
Referring Physician(s): Code Stroke- Amie [place]  Supervising Physician: Luanne Bras  Patient Status:  [name] - In-pt  Chief Complaint:  Acute CVA  Subjective:  Patient sitting up in chair. No complaints. Still some difficulty with word finding, but seems a little improved this morning.  Allergies: Patient has no known allergies.  Medications: Prior to Admission medications   Medication Sig Start Date End Date Taking? Authorizing Provider  apixaban (ELIQUIS) 5 MG TABS tablet Take 1 tablet (5 mg total) by mouth 2 (two) times daily. Patient not taking: Reported on 03/05/2019 02/08/18  Yes Turner, Eber Hong, MD  atorvastatin (LIPITOR) 40 MG tablet Take 1 tablet (40 mg total) by mouth daily. Please keep upcoming appt in September for future refills. Thank you Patient taking differently: Take 40 mg by mouth 3 (three) times a week.  03/01/18  Yes Imogene Burn, PA-C  nebivolol (BYSTOLIC) 10 MG tablet Take 1 tablet (10 mg total) by mouth daily. 05/21/18  Yes Turner, Eber Hong, MD  acetaminophen (TYLENOL) 325 MG tablet Take 2 tablets (650 mg total) by mouth every 6 (six) hours as needed for mild pain (or Fever >/= 101). Patient not taking: Reported on 03/06/2019 12/04/18   Eugenie Filler, MD  levothyroxine (SYNTHROID) 125 MCG tablet TAKE 1 TABLET BY MOUTH EVERY DAY 03/11/19   Burchette, Alinda Sierras, MD  Multiple Vitamin (MULTIVITAMIN WITH MINERALS) TABS tablet Take 1 tablet by mouth daily. Patient not taking: Reported on 02/27/2019 12/05/18   Eugenie Filler, MD  traMADol (ULTRAM) 50 MG tablet Take 1-2 tablets (50-100 mg total) by mouth every 6 (six) hours as needed for moderate pain or severe pain. 03/07/19   Michael Boston, MD     Vital Signs: BP 108/61    Pulse 65    Temp 98.6 F (37 C) (Oral)    Resp (!) 22    Ht 5\' 8"  (1.727 m)    Wt 61.5 kg    SpO2 99%    BMI 20.62 kg/m   Physical Exam Awake and alert Speech still dysarthric but seems a little improved today per patient and  nurse. Right groin stable. Small hematoma present. + Ecchymosis   Imaging: Mr Brain Wo Contrast  Result Date: 03/09/2019 CLINICAL DATA:  63 year old male emergent left ICA and left M2 Large Vessel occlusion status post endovascular treatment. EXAM: MRI HEAD WITHOUT CONTRAST TECHNIQUE: Multiplanar, multiecho pulse sequences of the brain and surrounding structures were obtained without intravenous contrast. COMPARISON:  CT head, CTA head and neck and CT perfusion 03/08/2019. FINDINGS: Brain: Restricted diffusion in the left MCA middle to anterior division territory involving most of the insula, left operculum cortex, cortex of the left inferior frontal gyrus and tracking cephalad to the left middle frontal gyrus (series 7, image 65). This is similar to the estimated core infarct distribution on CTP, perhaps slightly increased in extent. Associated cytotoxic edema with T2 and FLAIR hyperintensity. Superimposed small area of superior left parietal lobe cortical encephalomalacia on series 9, image 21. Trace petechial hemorrhage at the left insula on series 12, image 26. No contralateral right hemisphere or posterior fossa restricted diffusion. No midline shift, mass effect, evidence of mass lesion, ventriculomegaly, extra-axial collection or acute intracranial hemorrhage. Cervicomedullary junction and pituitary are within normal limits. Vascular: Major intracranial vascular flow voids are preserved, although the left ICA petrous segment flow void is diminished on series 15, image 6. There is some asymmetric left MCA branch FLAIR signal (series 9, image 13). Skull and  upper cervical spine: Chronic disc and endplate degeneration at C3-C4 with mild spinal stenosis at that level. There is heterogeneous marrow signal in the clivus on series 14, image 12 which is also conspicuous on diffusion-weighted imaging., but elsewhere visible bone marrow signal is within normal limits. This area appeared normal by CT.  Sinuses/Orbits: Negative orbits. Right frontal and frontoethmoidal sinus disease redemonstrated. Other: Mastoids are clear. Visible internal auditory structures appear normal. Scalp and face soft tissues appear negative. IMPRESSION: 1. Acute Left MCA infarct involving the middle/anterior division territory similar to the CTP core estimate. Trace petechial hemorrhage (Heidelberg Classification 1a). No mass effect. 2. Asymmetrically decreased Left ICA petrous segment flow void, but otherwise preserved major vascular flow voids. 3. Indeterminate abnormal marrow signal in the clivus, which had an unremarkable CT appearance yesterday. Recommend follow-up Two-view Chest Radiographs when possible, and if negative a 3 month follow-up noncontrast Brain MRI to re-evaluate the clivus. Electronically Signed   By: Genevie Ann M.D.   On: 03/09/2019 13:21   Dg Swallowing Func-speech Pathology  Result Date: 03/10/2019 Objective Swallowing Evaluation: Type of Study: MBS-Modified Barium Swallow Study  Patient Details Name: Adrian [name] MRN: [id number] Date of Birth: 15-Aug-1955 Today's Date: 03/10/2019 Time: SLP Start Time (ACUTE ONLY): 0907 -SLP Stop Time (ACUTE ONLY): 0926 SLP Time Calculation (min) (ACUTE ONLY): 19 min Past Medical History: Past Medical History: Diagnosis Date  AF (paroxysmal atrial fibrillation) ([place]) 11/29/2018  Atrial flutter ([place]) 10/12/2015  CAD (coronary artery disease), native coronary artery 12/06/2015  a. 10/2015 MV: EF  37%, reversible defect inferior apex, intermediate risk findings; b. 10/2015 Cath: 20% mid RCA.  Colonic diverticular abscess   Diverticulitis   History of chemotherapy 2005  Cisplatin  Hypothyroidism   NICM (nonischemic cardiomyopathy) ([place]) 10/14/2015  a. Tachy mediated?;  b. Echo 3/17 - Mild concentric LVH, EF 30-35%, anteroseptal, anterior, anterolateral, apical anterior, lateral hypokinesis, trivial MR, mild to moderately reduced RVSF; c. LHC 3/17 - mRCA 20%  Paroxysmal atrial flutter  ([place])   a. TEE 3/17 with ? LAA clot-->s/p TEE/DCCV 11/24/2015  Radiation NOv.3,2005-Dec. 15, 2005  6810 cGy in 30 fractions  Tonsil cancer [facility]) 2005  Dr Romeo Rabon.  XRT Past Surgical History: Past Surgical History: Procedure Laterality Date  APPENDECTOMY N/A 03/07/2019  Procedure: ROBOTIC ASSISTED APPENDECTOMY;  Surgeon: Michael Boston, MD;  Location: [facility];  Service: General;  Laterality: N/A;  CARDIAC CATHETERIZATION N/A 10/18/2015  Procedure: Left Heart Cath and Coronary Angiography;  Surgeon: Burnell Blanks, MD;  Location: [facility] [facility];  Service: Cardiovascular;  Laterality: N/A;  CARDIOVERSION N/A 11/24/2015  Procedure: CARDIOVERSION;  Surgeon: Josue Hector, MD;  Location: [place];  Service: Cardiovascular;  Laterality: N/A;  CYSTOSCOPY WITH STENT PLACEMENT Bilateral 03/07/2019  Procedure: CYSTOSCOPY WITH BILATERAL FIREFLY INJECTION;  Surgeon: Ardis Hughs, MD;  Location: [facility];  Service: Urology;  Laterality: Bilateral;  GASTROSTOMY TUBE PLACEMENT  07/04/2004  IR - G tube for tonsilar cancer  IR RADIOLOGIST EVAL & MGMT  12/18/2018  LAMINECTOMY    C5/placement of steel plate  NECK SURGERY  2003  replaced disk  TEE WITHOUT CARDIOVERSION N/A 10/15/2015  Procedure: TRANSESOPHAGEAL ECHOCARDIOGRAM (TEE);  Surgeon: Larey Dresser, MD;  Location: [place];  Service: Cardiovascular;  Laterality: N/A;  TEE WITHOUT CARDIOVERSION N/A 11/24/2015  Procedure: TRANSESOPHAGEAL ECHOCARDIOGRAM (TEE);  Surgeon: Josue Hector, MD;  Location: [place];  Service: Cardiovascular;  Laterality: N/A;  XI ROBOTIC ASSISTED COLOSTOMY TAKEDOWN N/A 03/07/2019  Procedure: XI ROBOTIC ASSISTED LOW ANTERIOR  RESECTION, RIGID PROCTOSCOPY;  Surgeon: Michael Boston, MD;  Location: [facility];  Service: General;  Laterality: N/A; HPI: Adrian [name] is a 63 y.o. male coronary artery disease, tonsillar ca in remission, atrial fibrillation on anticoagulation which was held for surgery since last Tuesday,  transferred emergently from [facility] where he was admitted for diverticulitis of the large intestine with abscess requiring exploratory laparotomy, low anterior resection rigid proctoscopy, appendectomy, last seen normal at 7:30 PM on 03/08/2019 at [facility] when it was noticed that he had difficulty with speech- speech did not make sense. CTA head and neck was also done which revealed a left cervical carotid occlusion as well as left MCA occlusion. S/P bilateral common carotid and RT vertbral arteriograms followed by attempted revascularization of occluded Lt ICA prox .MCA revascularization via collaterals TICI 2b to 2C . MRI shows Restricted diffusion in the left MCA middle to anterior division territory involving most of the insula, left operculum  Subjective: pt alert, aphasic Assessment / Plan / Recommendation CHL IP CLINICAL [name] 03/10/2019 Clinical Impression Pt has a moderate to severe oropharyngeal dysphagia that is suspected to be acute on chronic given new stroke in addition to prior ACDF and XRT for tonsillar cancer. His oral phase is mildly prolonged, more so as boluses become thicker and mor solid. Bolus cohesion is reduced and spills backward into the pharynx not as a formed bolus, at times using a posterior head tilt to initiate posterior propulsion (making chin tuck a difficult strategy to use). Pharyngeally he exhibits reduced pharyngeal squeeze, base of tongue retraction, and hyolaryngeal movement. Epiglottic inversion and airway closure are reduced. He often penetrates during the swallow as a result. More significantly, he also has frequent aspiration before the swallow with thin liquids (a combined result of oral and pharyngeal impairments) and after the swallow with all consistencies. Moderate residue primarily at the valleculae with thin liquids increases up to more severe residue also at the pyriform sinuses with limited entrance into the UES with purees. Varying  volume and bolus presentation did not increase airway protection or efficiency of swallow. Most aspiration goes unsensed, and his cued cough and intermittent spontaneous are only able to partially clear the airway. Recommend that he remain NPO with consideration of temporary, alternative means of nutrition. Could also provided a few single ice chips after oral care to utilize swallowing musculature and facilitate secretion management. Will f/u for exercises to maximize swallow and cough as well as completion of speech-language evaluation.  SLP Visit Diagnosis Dysphagia, oropharyngeal phase (R13.12) Attention and concentration deficit following -- Frontal lobe and executive function deficit following -- Impact on safety and function Severe aspiration risk   CHL IP TREATMENT RECOMMENDATION 03/10/2019 Treatment Recommendations Therapy as outlined in treatment plan below   Prognosis 03/10/2019 Prognosis for Safe Diet Advancement Good Barriers to Reach Goals Severity of deficits Barriers/Prognosis Comment -- CHL IP DIET RECOMMENDATION 03/10/2019 SLP Diet Recommendations NPO;Alternative means - temporary Liquid Administration via -- Medication Administration Via alternative means Compensations -- Postural Changes --   CHL IP OTHER RECOMMENDATIONS 03/10/2019 Recommended Consults -- Oral Care Recommendations Oral care QID Other Recommendations Have oral suction available   CHL IP FOLLOW UP RECOMMENDATIONS 03/10/2019 Follow up Recommendations Inpatient Rehab   CHL IP FREQUENCY AND DURATION 03/10/2019 Speech Therapy Frequency (ACUTE ONLY) min 2x/week Treatment Duration 2 weeks      CHL IP ORAL PHASE 03/10/2019 Oral Phase Impaired Oral - Pudding Teaspoon -- Oral - Pudding Cup -- Oral -  Honey Teaspoon -- Oral - Honey Cup Reduced posterior propulsion;Decreased bolus cohesion Oral - Nectar Teaspoon Reduced posterior propulsion;Decreased bolus cohesion Oral - Nectar Cup Reduced posterior propulsion;Decreased bolus cohesion Oral - Nectar Straw  -- Oral - Thin Teaspoon Reduced posterior propulsion;Decreased bolus cohesion Oral - Thin Cup Reduced posterior propulsion;Decreased bolus cohesion Oral - Thin Straw -- Oral - Puree Reduced posterior propulsion;Delayed oral transit;Decreased bolus cohesion Oral - Mech Soft -- Oral - Regular -- Oral - Multi-Consistency -- Oral - Pill -- Oral Phase - Comment --  CHL IP PHARYNGEAL PHASE 03/10/2019 Pharyngeal Phase -- Pharyngeal- Pudding Teaspoon -- Pharyngeal -- Pharyngeal- Pudding Cup -- Pharyngeal -- Pharyngeal- Honey Teaspoon -- Pharyngeal -- Pharyngeal- Honey Cup Reduced pharyngeal peristalsis;Reduced epiglottic inversion;Reduced anterior laryngeal mobility;Reduced laryngeal elevation;Reduced airway/laryngeal closure;Reduced tongue base retraction;Pharyngeal residue - valleculae;Pharyngeal residue - pyriform;Penetration/Apiration after swallow Pharyngeal Material enters airway, passes BELOW cords without attempt by patient to eject out (silent aspiration) Pharyngeal- Nectar Teaspoon Reduced pharyngeal peristalsis;Reduced epiglottic inversion;Reduced anterior laryngeal mobility;Reduced laryngeal elevation;Reduced airway/laryngeal closure;Reduced tongue base retraction;Pharyngeal residue - valleculae;Penetration/Apiration after swallow;Pharyngeal residue - pyriform Pharyngeal Material enters airway, passes BELOW cords without attempt by patient to eject out (silent aspiration) Pharyngeal- Nectar Cup Reduced pharyngeal peristalsis;Reduced epiglottic inversion;Reduced anterior laryngeal mobility;Reduced laryngeal elevation;Reduced airway/laryngeal closure;Reduced tongue base retraction;Pharyngeal residue - valleculae;Penetration/Apiration after swallow;Pharyngeal residue - pyriform Pharyngeal Material enters airway, passes BELOW cords without attempt by patient to eject out (silent aspiration) Pharyngeal- Nectar Straw -- Pharyngeal -- Pharyngeal- Thin Teaspoon Reduced pharyngeal peristalsis;Reduced epiglottic  inversion;Reduced anterior laryngeal mobility;Reduced laryngeal elevation;Reduced airway/laryngeal closure;Reduced tongue base retraction;Pharyngeal residue - valleculae Pharyngeal -- Pharyngeal- Thin Cup Reduced pharyngeal peristalsis;Reduced epiglottic inversion;Reduced anterior laryngeal mobility;Reduced laryngeal elevation;Reduced airway/laryngeal closure;Reduced tongue base retraction;Pharyngeal residue - valleculae;Penetration/Aspiration before swallow;Penetration/Apiration after swallow;Pharyngeal residue - pyriform Pharyngeal Material enters airway, passes BELOW cords without attempt by patient to eject out (silent aspiration);Material enters airway, passes BELOW cords and not ejected out despite cough attempt by patient Pharyngeal- Thin Straw -- Pharyngeal -- Pharyngeal- Puree Reduced pharyngeal peristalsis;Reduced epiglottic inversion;Reduced anterior laryngeal mobility;Reduced laryngeal elevation;Reduced airway/laryngeal closure;Reduced tongue base retraction;Pharyngeal residue - valleculae;Pharyngeal residue - pyriform Pharyngeal -- Pharyngeal- Mechanical Soft -- Pharyngeal -- Pharyngeal- Regular -- Pharyngeal -- Pharyngeal- Multi-consistency -- Pharyngeal -- Pharyngeal- Pill -- Pharyngeal -- Pharyngeal Comment --  CHL IP CERVICAL ESOPHAGEAL PHASE 03/10/2019 Cervical Esophageal Phase Impaired Pudding Teaspoon -- Pudding Cup -- Honey Teaspoon -- Honey Cup Reduced cricopharyngeal relaxation Nectar Teaspoon Reduced cricopharyngeal relaxation Nectar Cup Reduced cricopharyngeal relaxation Nectar Straw -- Thin Teaspoon Reduced cricopharyngeal relaxation Thin Cup Reduced cricopharyngeal relaxation Thin Straw -- Puree Reduced cricopharyngeal relaxation Mechanical Soft -- Regular -- Multi-consistency -- Pill -- Cervical Esophageal Comment -- Venita Sheffield Nix 03/10/2019, 10:37 AM  [name], M.A. CCC-SLP Acute Rehabilitation Services Pager [phone] Office [phone]             Vas [place] Groin  Pseudoaneurysm  Result Date: 03/12/2019  ARTERIAL PSEUDOANEURYSM  Exam: Right groin Indications: Patient complains of palpable knot. History: Acute CVA (left MCA infarct) s/p diagnostic cerebral arteriogram with attempted revascularization of occluded proximal left ICA achieving a TICI 2b to 2c revascularization 03/09/2019. Comparison Study: No prior studies. Performing Technologist: Oliver Hum RVT  Examination Guidelines: A complete evaluation includes B-mode imaging, spectral Doppler, color Doppler, and power Doppler as needed of all accessible portions of each vessel. Bilateral testing is considered an integral part of a complete examination. Limited examinations for reoccurring indications may be performed as noted. +------------+----------+---------+------+----------+  Right Duplex PSV (cm/s) Waveform  Plaque Comment(s)  +------------+----------+---------+------+----------+  CFA             144     triphasic                    +------------+----------+---------+------+----------+  PFA              82     triphasic                    +------------+----------+---------+------+----------+  Prox SFA         99     triphasic                    +------------+----------+---------+------+----------+  Findings: An area with well defined borders was visualized arising off of the CFA with ultrasound characteristics of a pseudoaneurysm. The neck measures approximately 0.5 cm wide and 0.4 cm long. Multi-chambered pseudoaneurysm with a neck that measures 0.43 cm long and 0.5 cm wide.  Diagnosing physician: Curt Jews MD Electronically signed by Curt Jews MD on 03/12/2019 at 11:08:21 AM.   --------------------------------------------------------------------------------    Final    Vas [place] Lower Ext Arterial Pseudo Injection  Result Date: 03/12/2019  ARTERIAL PSEUDOANEURYSM  Indications: Patient complains of palpable knot. Performing Technologist: Oliver Hum RVT  Examination Guidelines: A complete evaluation includes  B-mode imaging, spectral Doppler, color Doppler, and power Doppler as needed of all accessible portions of each vessel. Bilateral testing is considered an integral part of a complete examination. Limited examinations for reoccurring indications may be performed as noted.  Findings: An area was visualized with ultrasound characteristics of a pseudoaneurysm. The neck measures approximately 0.5 cm wide and 0.4 cm long.  Summary: Successful thrombin injection with closure of the right common femoral artery pseudoaneurysm performed by Dr. Donnetta Hutching and Dr. Carlis Abbott. Post injection, distal arterial flow demonstrates patent, triphasic waveforms.   --------------------------------------------------------------------------------    Preliminary     Labs:  CBC: Recent Labs    03/10/19 0545 03/11/19 0809 03/12/19 0321 03/13/19 0609  WBC 4.7 4.6 3.8* 3.6*  HGB 10.8* 9.5* 9.4* 9.4*  HCT 31.2* 27.5* 26.7* 26.9*  PLT 140* 146* 152 141*    COAGS: Recent Labs    11/29/18 2109 03/09/19 0215  03/09/19 2127 03/10/19 0545 03/10/19 1235 03/10/19 2034  INR 1.1 1.3*  --   --   --   --   --   APTT  --  >200*   < > 55* 51* 25 88*   < > = values in this interval not displayed.    BMP: Recent Labs    03/05/19 0944 03/08/19 0906 03/09/19 0656 03/12/19 0321  NA 137 136 139 137  K 4.9 4.1 3.8 3.3*  CL 101 102 104 106  CO2 28 27 25 22   GLUCOSE 110* 135* 98 130*  BUN 14 10 7* 6*  CALCIUM 9.0 8.5* 8.4* 8.0*  CREATININE 0.70 0.68 0.70 0.47*  GFRNONAA >60 >60 >60 >60  GFRAA >60 >60 >60 >60    LIVER FUNCTION TESTS: Recent Labs    09/23/18 0948 09/24/18 0153 11/29/18 1647  BILITOT 0.7 1.0 0.6  AST 18 14* 13*  ALT 12 10 13   ALKPHOS 61 51 73  PROT 7.3 5.9* 7.5  ALBUMIN 3.3* 2.5* 3.0*    Assessment and Plan:  Acute CVA (left MCA infarct) s/p diagnostic cerebral arteriogram with attempted revascularization of occluded proximal left ICA achieving a TICI 2b to 2c revascularization 03/09/2019 by Dr.  Estanislado Pandy.  Dysarthria (improved).  Right groin stable.  Possible transfer to Rehab today.  Electronically Signed: Murrell Redden, PA-C 03/13/2019, 8:48 AM    I spent a total of 15 Minutes at the the patient's bedside AND on the patient's hospital floor or unit, greater than 50% of which was counseling/coordinating care for code stroke/cerebral revasc.

## 2019-03-13 NOTE — Progress Notes (Addendum)
Physical Therapy Treatment Patient Details Name: Adrian [name] MRN: [id number] DOB: 03-22-1956 Today's Date: 03/13/2019    History of Present Illness Pt is a 63 y.o. M with significant PMH of CAD, tonsillar CA in remission, A-fib on anticoagulation which was held for recent surgery who was transferred emergently from [facility] where had been admitted for diverticulitis of large intestine with abscess requiring exploratory laparotomy, low anterior resection rigid proctoscopy, appendectomy. Presents with difficulty speaking. CTA head and neck showing left cervical carotid occlusion as well as left MCA occlusion. Attempted mechanical thrombectomy was unsuccessful and carotid artery also could not be revascularized.  NTZ:GYFVC Left MCA infarct involving the middle/anterior divisionterritory. Pt with right groin swelling/hematoma post IR concerning for aneurysm. Thrombin injection done 8/5.    PT Comments    Pt with improvements in mobility, ambulating 800 feet with min guard assist and no assistive device. Session focused on dual tasking with ambulation and cognition, participating in way finding, naming tasks, and counting. Pt with difficulty counting by 5's and naming animals with different parts of the alphabet; able to formulate word with cueing/hints. D/c plan updated.     Follow Up Recommendations  Outpatient PT;Supervision/Assistance - 24 hour     Equipment Recommendations  None recommended by PT    Recommendations for Other Services       Precautions / Restrictions Precautions Precautions: Fall Precaution Comments: SBP 120-140; NG tube Restrictions Weight Bearing Restrictions: No    Mobility  Bed Mobility Overal bed mobility: Needs Assistance Bed Mobility: Sit to Supine       Sit to supine: Supervision      Transfers Overall transfer level: Needs assistance Equipment used: None Transfers: Sit to/from Stand Sit to Stand: Supervision             Ambulation/Gait Ambulation/Gait assistance: Min guard Gait Distance (Feet): 800 Feet Assistive device: None Gait Pattern/deviations: Step-through pattern;Decreased stride length     General Gait Details: Improved cadence and ability to control speed, able to dual task during gait. No overt LOB, min guard for safety . Able to way find to different rooms/areas without cueing.    Stairs             Wheelchair Mobility    Modified Rankin (Stroke Patients Only) Modified Rankin (Stroke Patients Only) Pre-Morbid Rankin Score: No symptoms Modified Rankin: Moderately severe disability     Balance     Sitting balance-Leahy Scale: Good       Standing balance-Leahy Scale: Good                              Cognition Arousal/Alertness: Awake/alert Behavior During Therapy: WFL for tasks assessed/performed Overall Cognitive Status: Impaired/Different from baseline Area of Impairment: Memory;Safety/judgement;Awareness;Problem solving                     Memory: Decreased recall of precautions;Decreased short-term memory   Safety/Judgement: Decreased awareness of safety;Decreased awareness of deficits Awareness: Emergent Problem Solving: Slow processing;Difficulty sequencing General Comments: Pt with improved ability to form sentences, continues with difficulty with word finding. Needs cueing for naming tasks i.e. naming animals that start with different letters of alphabet, counting by 5's forwards and backwards to 50. Pt is aware of limitations      Exercises      General Comments        Pertinent Vitals/Pain Pain Assessment: No/denies pain    Home Living  Prior Function            PT Goals (current goals can now be found in the care plan section) Acute Rehab PT Goals Patient Stated Goal: "get my words back." Potential to Achieve Goals: Good Progress towards PT goals: Progressing toward goals     Frequency    Min 4X/week      PT Plan Discharge plan needs to be updated    Co-evaluation              AM-PAC PT "6 Clicks" Mobility   Outcome Measure  Help needed turning from your back to your side while in a flat bed without using bedrails?: None Help needed moving from lying on your back to sitting on the side of a flat bed without using bedrails?: None Help needed moving to and from a bed to a chair (including a wheelchair)?: None Help needed standing up from a chair using your arms (e.g., wheelchair or bedside chair)?: None Help needed to walk in hospital room?: A Little Help needed climbing 3-5 steps with a railing? : A Little 6 Click Score: 22    End of Session Equipment Utilized During Treatment: Gait belt Activity Tolerance: Patient tolerated treatment well Patient left: with call bell/phone within reach;in bed;with bed alarm set Nurse Communication: Mobility status PT Visit Diagnosis: Unsteadiness on feet (R26.81);Difficulty in walking, not elsewhere classified (R26.2)     Time: 9983-3825 PT Time Calculation (min) (ACUTE ONLY): 23 min  Charges:  $Therapeutic Activity: 23-37 mins                     Ellamae Sia, [place], DPT Acute Rehabilitation Services Pager [phone] Office [phone]    Willy Eddy 03/13/2019, 2:54 PM

## 2019-03-13 NOTE — Progress Notes (Addendum)
Patient ID: Adrian [name], male   DOB: Apr 03, 1956, 63 y.o.   MRN: [id number] Comfortable this morning. Right groin with hematoma but no expansile mass as was present yesterday. 2+ dorsalis pedis pulse. Okay to be up ad lib. Heparin has been infusing per pharmacy Repeat ultrasound of his groin pending  Addendum: Right groin duplex shows closure of false aneurysm.  Normal triphasic flow at the tibial level.  Will not follow actively.  Please call if we can assist

## 2019-03-13 NOTE — Progress Notes (Signed)
  Speech Language Pathology Treatment: Dysphagia  Patient Details Name: Adrian [name] MRN: [id number] DOB: [birth year] Today's Date: 03/13/2019 Time: 5883-2549 SLP Time Calculation (min) (ACUTE ONLY): 22 min  Assessment / Plan / Recommendation Clinical Impression  Pt seen for session following MBS with recommended consistencies and techniques for pt's ability to comply. He recalled and demonstrated right head turn with supervision and min cues for throat clear. Reiterated need for liquids via teaspoon, multiple swallows and coughs/throat clear. Educated Therapist, sports as well. ST will continue to treat.   HPI HPI: Adrian [name] is a 63 y.o. male coronary artery disease, tonsillar ca in remission, atrial fibrillation on anticoagulation which was held for surgery since last Tuesday, transferred emergently from [facility] where he was admitted for diverticulitis of the large intestine with abscess requiring exploratory laparotomy, low anterior resection rigid proctoscopy, appendectomy, last seen normal at 7:30 PM on 03/08/2019 at [facility] when it was noticed that he had difficulty with speech- speech did not make sense. CTA head and neck was also done which revealed a left cervical carotid occlusion as well as left MCA occlusion. S/P bilateral common carotid and RT vertbral arteriograms followed by attempted revascularization of occluded Lt ICA prox .MCA revascularization via collaterals TICI 2b to 2C . MRI shows Restricted diffusion in the left MCA middle to anterior division territory involving most of the insula, left operculum. Repeat MBS today for possible po initiation.      SLP Plan  Continue with current plan of care       Recommendations  Diet recommendations: Dysphagia 1 (puree);Nectar-thick liquid Liquids provided via: Teaspoon Medication Administration: Crushed with puree Supervision: Patient able to self feed;Full supervision/cueing for compensatory strategies Compensations:  Slow rate;Small sips/bites;Multiple dry swallows after each bite/sip;Clear throat intermittently;Hard cough after swallow;Other (Comment) Postural Changes and/or Swallow Maneuvers: Seated upright 90 degrees;Upright 30-60 min after meal                General recommendations: Rehab consult Oral Care Recommendations: Oral care BID Follow up Recommendations: Inpatient Rehab SLP Visit Diagnosis: Dysphagia, pharyngeal phase (R13.13) Plan: Continue with current plan of care       GO                Houston Siren 03/13/2019, 8:50 PM

## 2019-03-13 NOTE — Progress Notes (Signed)
ANTICOAGULATION CONSULT NOTE - Follow Up Consult  Pharmacy Consult for heparin Indication: atrial fibrillation and stroke  Labs: Recent Labs    03/10/19 2034  03/11/19 0809 03/12/19 0321 03/12/19 2137 03/13/19 0609 03/13/19 1542  HGB  --    < > 9.5* 9.4*  --  9.4*  --   HCT  --   --  27.5* 26.7*  --  26.9*  --   PLT  --   --  146* 152  --  141*  --   APTT 88*  --   --   --   --   --   --   HEPARINUNFRC 0.28*  --  0.31 0.24* 0.16* 0.37 0.36  CREATININE  --   --   --  0.47*  --   --   --    < > = values in this interval not displayed.    Assessment/Plan:  63yo male therapeutic on heparin after rate change.   Heparin remains at goal - continue current rate Recheck with daily labs in AM  Levester Fresh, PharmD, BCPS, BCCCP Clinical Pharmacist [phone]  Please check AMION for all [place] numbers  03/13/2019 5:17 PM

## 2019-03-13 NOTE — Progress Notes (Signed)
ANTICOAGULATION CONSULT NOTE - Follow Up Consult  Pharmacy Consult for heparin Indication: atrial fibrillation and stroke  Labs: Recent Labs    03/10/19 1235 03/10/19 2034  03/11/19 0809 03/12/19 0321 03/12/19 2137 03/13/19 0609  HGB  --   --    < > 9.5* 9.4*  --  9.4*  HCT  --   --   --  27.5* 26.7*  --  26.9*  PLT  --   --   --  146* 152  --  141*  APTT 25 88*  --   --   --   --   --   HEPARINUNFRC <0.10* 0.28*  --  0.31 0.24* 0.16* 0.37  CREATININE  --   --   --   --  0.47*  --   --    < > = values in this interval not displayed.    Assessment/Plan:  63yo male therapeutic on heparin after rate change. Will continue gtt at current rate and confirm stable with additional level.   Wynona Neat, PharmD, BCPS  03/13/2019,6:58 AM

## 2019-03-13 NOTE — Progress Notes (Signed)
Speech Language Pathology Patient Details Name: Adrian [name] MRN: [id number] DOB: 06/14/1956 Today's Date: 03/13/2019 Time:  -     Will repeat MBS today with transport getting pt sometime between 1330 and 1400                    Royce Macadamia 03/13/2019, 12:57 PM   Breck Coons Henry.Ed Nurse, children's [phone] Office [phone]

## 2019-03-13 NOTE — Progress Notes (Signed)
RLE limited arterial groin pseudo eval       has been completed. Preliminary results can be found under CV proc through chart review. June Leap, BS, RDMS, RVT

## 2019-03-13 NOTE — Progress Notes (Signed)
Modified Barium Swallow Progress Note  Patient Details  Name: Adrian [name] MRN: [id number] Date of Birth: 12-18-1955  Today's Date: 03/13/2019  Modified Barium Swallow completed.  Full report located under Chart Review in the Imaging Section.  Brief recommendations include the following:  Clinical Impression  Pt's swallow function has improved since initial MBS. Cohesion, control and oral transit of boluses was more coordinated, improved timeliness with effective lingual ROM against hard palate to propel boluses. Laryngeal closure and inversion of epiglottis continues to be reduced, however less than prior MBS. Laryngeal intrusion was reduced to penetration versus aspiration although reaching his vocal cords with honey thick. Nectar thick was penetrated into anterior portion of vestibule and from spill over from the pyriform sinuses. He  senses penetrates more than half the time but when cued to cough was able to clear inconsistently. Multiple postures/strategies performed including chin tuck, head turn and breath hold with which were less safe with thin liquids. Vallecular and pyriform sinus residue was moderate throughout and pt performed 3-4 subsequent swallows without cues. Residue was not signiricantly increased with nectar thick A combination of head turn to the right, multiple swallows and inconsistent cough/throat clear and teaspoon administration only with nectar is recommended and puree solids (D1). He will likely continue to experience some degree of penetration with these recommendations however liquid modificantion, posture, bolus control hopefully will decrease his risk. He is also ambulatory with assist/supervision which is advantageous for pulmonary function.    Swallow Evaluation Recommendations       SLP Diet Recommendations: Dysphagia 1 (Puree) solids;Nectar thick liquid   Liquid Administration via: Spoon   Medication Administration: Crushed with puree   Supervision: Patient  able to self feed;Full supervision/cueing for compensatory strategies   Compensations: Slow rate;Small sips/bites;Multiple dry swallows after each bite/sip;Clear throat intermittently;Hard cough after swallow;Other (Comment)(right head turn)   Postural Changes: Seated upright at 90 degrees;Remain semi-upright after after feeds/meals (Comment)   Oral Care Recommendations: Oral care BID   Other Recommendations: Have oral suction available    Houston Siren 03/13/2019,8:42 PM

## 2019-03-14 LAB — CREATININE, SERUM
Creatinine, Ser: 0.59 mg/dL — ABNORMAL LOW (ref 0.61–1.24)
GFR calc Af Amer: >60 mL/min (ref >60)
GFR calc non Af Amer: >60 mL/min (ref >60)

## 2019-03-14 LAB — POTASSIUM: Potassium: 3.9 mmol/L (ref 3.5–5.1)

## 2019-03-14 LAB — HEPARIN LEVEL (UNFRACTIONATED): Heparin Unfractionated: 0.38 IU/mL (ref 0.30–0.70)

## 2019-03-14 LAB — CBC
HCT: 26.3 % — ABNORMAL LOW (ref 39.0–52.0)
Hemoglobin: 9 g/dL — ABNORMAL LOW (ref 13.0–17.0)
MCH: 30.8 pg (ref 26.0–34.0)
MCHC: 34.2 g/dL (ref 30.0–36.0)
MCV: 90.1 fL (ref 80.0–100.0)
Platelets: 146 10*3/uL — ABNORMAL LOW (ref 150–400)
RBC: 2.92 MIL/uL — ABNORMAL LOW (ref 4.22–5.81)
RDW: 15.6 % — ABNORMAL HIGH (ref 11.5–15.5)
WBC: 3.3 10*3/uL — ABNORMAL LOW (ref 4.0–10.5)
nRBC: 0 % (ref 0.0–0.2)

## 2019-03-14 LAB — MAGNESIUM: Magnesium: 2 mg/dL (ref 1.7–2.4)

## 2019-03-14 MED ORDER — APIXABAN 5 MG PO TABS
5.0000 mg | ORAL_TABLET | Freq: Two times a day (BID) | ORAL | Status: DC
  Administered 2019-03-14 – 2019-03-16 (×5): 5 mg via ORAL
  Filled 2019-03-14 (×5): qty 1

## 2019-03-14 MED ORDER — ATORVASTATIN CALCIUM 40 MG PO TABS
40.0000 mg | ORAL_TABLET | Freq: Every evening | ORAL | Status: DC
  Administered 2019-03-14 – 2019-03-15 (×2): 40 mg via ORAL
  Filled 2019-03-14 (×2): qty 1

## 2019-03-14 MED ORDER — LEVOTHYROXINE SODIUM 25 MCG PO TABS
125.0000 ug | ORAL_TABLET | Freq: Every day | ORAL | Status: DC
  Administered 2019-03-14 – 2019-03-16 (×3): 125 ug via ORAL
  Filled 2019-03-14 (×3): qty 1

## 2019-03-14 MED ORDER — ADULT MULTIVITAMIN W/MINERALS CH
1.0000 | ORAL_TABLET | Freq: Every day | ORAL | Status: DC
  Administered 2019-03-14 – 2019-03-16 (×3): 1 via ORAL
  Filled 2019-03-14 (×3): qty 1

## 2019-03-14 NOTE — Progress Notes (Signed)
STROKE TEAM PROGRESS NOTE   SUBJECTIVE (INTERVAL HISTORY) He is feeling well, he feels comfortable eating but wasn;t very hungry due to tube feeds and cortak in place.  OBJECTIVE Vitals:   03/14/19 0900 03/14/19 1000 03/14/19 1100 03/14/19 1200  BP: (!) 100/48 (!) 114/57 110/62 104/73  Pulse: (!) 54 (!) 56 (!) 116 (!) 107  Resp: 15 19 15  (!) 23  Temp:    98.6 F (37 C)  TempSrc:    Oral  SpO2: 100% 100% 100% (!) 72%  Weight:      Height:        CBC:  Recent Labs  Lab 03/09/19 0656  03/13/19 0609 03/14/19 0602  WBC 5.0   < > 3.6* 3.3*  NEUTROABS 0.9*  --   --   --   HGB 12.5*   < > 9.4* 9.0*  HCT 36.5*   < > 26.9* 26.3*  MCV 91.9   < > 88.2 90.1  PLT 125*   < > 141* 146*   < > = values in this interval not displayed.    Basic Metabolic Panel:  Recent Labs  Lab 03/09/19 0656 03/12/19 0321 03/14/19 0602  NA 139 137  --   K 3.8 3.3* 3.9  CL 104 106  --   CO2 25 22  --   GLUCOSE 98 130*  --   BUN 7* 6*  --   CREATININE 0.70 0.47* 0.59*  CALCIUM 8.4* 8.0*  --   MG  --  1.8 2.0    Lipid Panel:     Component Value Date/Time   CHOL 110 03/09/2019 0656   CHOL 159 03/09/2017 1032   TRIG 97 03/09/2019 0656   HDL 45 03/09/2019 0656   HDL 61 03/09/2017 1032   CHOLHDL 2.4 03/09/2019 0656   VLDL 19 03/09/2019 0656   LDLCALC 46 03/09/2019 0656   LDLCALC 66 03/09/2017 1032   HgbA1c:  Lab Results  Component Value Date   HGBA1C 5.5 03/09/2019    IMAGING Ct Head Code Stroke Wo Contrast 03/08/2019 1. Normal appearance of the brain itself. However, there is a definite hyperdense left M2 branch consistent with an acute embolus. Right frontal sinusitis.  2. ASPECTS is 10.   Ct Angio Head W Or Wo Contrast Ct Angio Neck W Or Wo Contrast 03/08/2019 Acute occlusion of the left ICA at its origin. Reconstitution of the ICA by collaterals at the skull base level. Occluded left M2 branch.  Ct Cerebral Perfusion W Contrast 03/08/2019 Acute core infarct involving the  anterior left MCA distribution with moderate surrounding ischemic penumbra as above.   Interventional Radiology 03/09/2019 S/P bilateral common carotid and RT vertbral arteriograms followed by attempted revascularization of occluded Lt ICA prox . MCA revascularization via collaterals TICI 2b to 2C .  MRI Head WO Contrast  03/09/2019 1. Acute Left MCA infarct involving the middle/anterior division territory similar to the CTP core estimate. Trace petechial hemorrhage (Heidelberg Classification 1a). No mass effect. 2. Asymmetrically decreased Left ICA petrous segment flow void, but otherwise preserved major vascular flow voids. 3. Indeterminate abnormal marrow signal in the clivus, which had an unremarkable CT appearance yesterday. Recommend follow-up Two-view Chest Radiographs when possible, and if negative a 3 month follow-up noncontrast Brain MRI to re-evaluate the clivus.   Transthoracic Echocardiogram  8/2//2020  1. The left ventricle has normal systolic function with an ejection fraction of 60-65%. The cavity size was normal. Left ventricular diastolic parameters were normal.  2. The right ventricle  has normal systolic function. The cavity was normal. There is no increase in right ventricular wall thickness.  3. No evidence of mitral valve stenosis.  4. The tricuspid valve is grossly normal.  5. No stenosis of the aortic valve.  6. The aorta is normal in size and structure.  7. The aortic root and ascending aorta are normal in size and structure.  8. The interatrial septum was not assessed.  Vas [place] Lower Ext Arterial Pseudo Injection 03/12/2019 Findings: An area was visualized with ultrasound characteristics of a pseudoaneurysm. The neck measures approximately 0.5 cm wide and 0.4 cm long.  Summary: Successful thrombin injection with closure of the right common femoral artery pseudoaneurysm performed by Dr. Donnetta Hutching and Dr. Carlis Abbott. Post injection, distal arterial flow demonstrates patent, triphasic  waveforms.     Vas [place] Lower Ext Arterial  03/13/2019 Right groin pseudoaneurysm injection remains successful. No evidence of reopened pseudo.  PHYSICAL EXAM Pleasant middle-aged Caucasian male not in distress. . Afebrile. Head is nontraumatic. Neck is supple without bruit.    Cardiac exam no murmur or gallop. Lungs are clear to auscultation. Distal pulses are well felt.  Right groin pulsatile swelling mildly tender to touch. Neurological Exam :  Awake alert oriented to time place and person.  Moderate expressive aphasia with word finding difficulties and paraphasic errors.  Able to speak occasional short sentences.  Good comprehension and follows commands well.  Poor naming and repetition.  Dysarthria present.    Mild right lower facial weakness.  Tongue midline.  Extraocular movements full range without nystagmus.  Blinks to threat bilaterally.  Motor system exam symmetric upper and lower extremity strength no focal weakness.  Fine finger movements are slightly diminished on the right.  LT intack bilat.  Gait not tested.   ASSESSMENT/PLAN [name] is a 63 y.o. male with history of coronary artery disease, ongoing tobacco use, hx of NICM, tonsillar ca in remission, atrial fibrillation on anticoagulation which was held for surgery since last Tuesday - Surgery [facility] 0n 03/06/19 - diverticulitis of the large intestine with abscess requiring exploratory laparotomy, low anterior resection rigid proctoscopy, appendectomy. Post op the pt developed speech difficulties. He did not receive IV t-PA due to recent surgery. He was transferred to [facility] where he went to IR with TICI 2b revascularization of the L MCA, w/ attempted revascularization of the prox L ICA.  Stroke:  Left MCA infarct embolic d/t known AF off AC for recent surgery s/p IR with attempted revascularization occluded left ICA and partial revascularization L MCA  CT head - Normal appearance of the brain itself. However, there is a definite  hyperdense left M2 branch consistent with an acute embolus  CTA H&N - Acute occlusion of the left ICA at its origin. Reconstitution of the ICA by collaterals at the skull base level. Occluded left M2 branch.  CT Perfusion - Acute core infarct involving the anterior left MCA distribution with moderate surrounding ischemic penumbra   Cerebral angio occluded L ICA w/ attempted revascularization. MCA revascularization per collaterals TICI 2b.  Post IR CT no ICH or mass effect  MRI head - L MCA infarct, trace petechial hmg. Decreased L ICA flow void. indeterminate abnormal marrow signal in the clivius. Check view CXR and iof neg a 3-mo f/u noncontrast MRI to re-eval the clivus  2D Echo  - EF 60 - 65%. No cardiac source of emboli identified.   Sars Corona Virus 2 - negative  LDL - 46  HgbA1c -  5.5  VTE prophylaxis - Lovenox  Eliquis (apixaban) daily prior to admission, now on heparin IV. Ok to start oral AC from surgeon's and neuro standpoint; transition from IV heparin to eliquis.  Therapy recommendations:  OP PT, OT, SLP - ORDERED (spoke with inpatient rehab, too high level for CIR)  Disposition:  Return home  Atrial Fibrillation  Home anticoagulation:  Eliquis (apixaban) daily - on hold for recent surgery  Now on IV heparin  Ok for [facility] per surgeon and neuro  TRANSITION FROM IV HEPARIN TO ELIQUIS   ICA occlusion  Chronically occluded L ICA  Attempted revascularization  Hypertension  Stable  Home meds: bystolic . Long-term BP goal normotensive  Hyperlipidemia  Lipid lowering medication PTA:  Lipitor 40 mg daily  LDL 46, goal < 70  Current lipid lowering medication:  Lipitor 40 mg daily  Continue statin at discharge  Dysphagia . Secondary to stroke, Swallow impairment impacted by prior ACDF and radiation tx to tonsils. Had PEG at that time. Marland Kitchen MBS cleared for D1 nectar thick liquids yesterday . Tolerated only 25% of meal with liquids via spoon . Has Cortrak w/  tube feedings . Surgeon has okayed solids once able to swallow . 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  Coronary artery disease  Nonischemic CM  Other Active Problems  Mild Thrombocytopenia, resolved - 125...->146  S/P exploratory laparotomy, low anterior resection rigid proctoscopy, appendectomy 03/06/19 - path c/w diverticulitis. benign  CBC w/ atypical lymphocytes  History of tonsillar cancer treated with radiation and cisplatin and 2005. Had a G-tube placed at the time.  Hypokalemia 3.3 supplemented - 3.9  Hospital day # 7  Discussed with Dr. Johney Maine today, stroke will sign off.   Personally examined patient and images, and have participated in and made any corrections needed to history, physical, neuro exam,assessment and plan as stated above.  I have personally obtained the history, evaluated lab date, reviewed imaging studies and agree with radiology interpretations.    Sarina Ill, MD Stroke Neurology   A total of 15 minutes was spent for the care of this patient, spent on counseling patient and family on different diagnostic and therapeutic options, counseling and coordination of care, riskd ans benefits of management, compliance, or risk factor reduction and education.   Sarina Ill, MD Stroke Neurology   To contact Stroke Continuity provider, please refer to [email or web address] After hours, contact General Neurology

## 2019-03-14 NOTE — Progress Notes (Signed)
Physical Therapy Treatment Patient Details Name: Adrian [name] MRN: [id number] DOB: 1956/05/19 Today's Date: 03/14/2019    History of Present Illness Pt is a 63 y.o. M with significant PMH of CAD, tonsillar CA in remission, A-fib on anticoagulation which was held for recent surgery who was transferred emergently from [facility] where had been admitted for diverticulitis of large intestine with abscess requiring exploratory laparotomy, low anterior resection rigid proctoscopy, appendectomy. Presents with difficulty speaking. CTA head and neck showing left cervical carotid occlusion as well as left MCA occlusion. Attempted mechanical thrombectomy was unsuccessful and carotid artery also could not be revascularized.  HAL:PFXTK Left MCA infarct involving the middle/anterior divisionterritory. Pt with right groin swelling/hematoma post IR concerning for aneurysm. Thrombin injection done 8/5.    PT Comments    Pt progressing to supervision level with mobility, ambulating 800 feet with no assistive device without difficulty. Able to perform  High level balance activities I.e. braiding, backwards walking, side stepping without loss of balance. Session focused on dual tasking with ambulation, with naming tasks and counting forwards and backwards by 10's. Pt requiring increased time and mod cueing for cognition tasks. Will continue to challenge cognition with mobility.     Follow Up Recommendations  Outpatient PT;Supervision/Assistance - 24 hour     Equipment Recommendations  None recommended by PT    Recommendations for Other Services       Precautions / Restrictions Precautions Precautions: Fall Precaution Comments: SBP 120-140; NG tube Restrictions Weight Bearing Restrictions: No    Mobility  Bed Mobility Overal bed mobility: Independent                Transfers Overall transfer level: Independent Equipment used: None                Ambulation/Gait Ambulation/Gait  assistance: Supervision Gait Distance (Feet): 800 Feet Assistive device: None Gait Pattern/deviations: WFL(Within Functional Limits)     General Gait Details: Supervision for safety, no overt LOB with high level balance tasks.    Stairs             Wheelchair Mobility    Modified Rankin (Stroke Patients Only) Modified Rankin (Stroke Patients Only) Pre-Morbid Rankin Score: No symptoms Modified Rankin: Moderately severe disability     Balance     Sitting balance-Leahy Scale: Good       Standing balance-Leahy Scale: Good               High level balance activites: Side stepping;Braiding;Backward walking;Direction changes;Turns;Sudden stops High Level Balance Comments: Supervision for safety, slightly slower speed            Cognition Arousal/Alertness: Awake/alert Behavior During Therapy: WFL for tasks assessed/performed Overall Cognitive Status: Impaired/Different from baseline Area of Impairment: Memory;Safety/judgement;Awareness;Problem solving                     Memory: Decreased recall of precautions;Decreased short-term memory   Safety/Judgement: Decreased awareness of safety;Decreased awareness of deficits Awareness: Emergent Problem Solving: Slow processing;Difficulty sequencing General Comments: Pt with improved ability to form sentences, continues with difficulty with word finding. Needs cueing for naming tasks i.e. naming states that start with different letters of alphabet, counting by 10's forwards and backwards to 100. Pt is aware of limitations      Exercises      General Comments        Pertinent Vitals/Pain Pain Assessment: No/denies pain    Home Living  Prior Function            PT Goals (current goals can now be found in the care plan section) Acute Rehab PT Goals Patient Stated Goal: "get my words back." Potential to Achieve Goals: Good Progress towards PT goals: Progressing  toward goals    Frequency    Min 4X/week      PT Plan Current plan remains appropriate    Co-evaluation              AM-PAC PT "6 Clicks" Mobility   Outcome Measure  Help needed turning from your back to your side while in a flat bed without using bedrails?: None Help needed moving from lying on your back to sitting on the side of a flat bed without using bedrails?: None Help needed moving to and from a bed to a chair (including a wheelchair)?: None Help needed standing up from a chair using your arms (e.g., wheelchair or bedside chair)?: None Help needed to walk in hospital room?: None Help needed climbing 3-5 steps with a railing? : A Little 6 Click Score: 23    End of Session Equipment Utilized During Treatment: Gait belt Activity Tolerance: Patient tolerated treatment well Patient left: with call bell/phone within reach;in bed Nurse Communication: Mobility status PT Visit Diagnosis: Unsteadiness on feet (R26.81);Difficulty in walking, not elsewhere classified (R26.2)     Time: 8921-1941 PT Time Calculation (min) (ACUTE ONLY): 23 min  Charges:  $Therapeutic Activity: 23-37 mins                     Ellamae Sia, [place], DPT Acute Rehabilitation Services Pager [phone] Office [phone]    Willy Eddy 03/14/2019, 4:59 PM

## 2019-03-14 NOTE — Progress Notes (Signed)
Inpatient Rehabilitation-Admissions Coordinator   Met with pt at the bedside. He is doing too well from a mobility standpoint to qualify for an IP Rehab Admission. Agree with PT recommendation for Outpatient therapy. AC will not pursue CIR admission at this time and will sign off.   Please call if questions.   Jhonnie Garner, OTR/L  Rehab Admissions Coordinator  [phone] 03/14/2019 11:03 AM

## 2019-03-14 NOTE — Progress Notes (Signed)
Discussed with neurology.  Therapy with physical occupational and speech therapy are hopeful that he is making improvement note that he can transition to home health.  Dr. Hulda Humphrey and her team will help facilitate that transition.  We will transfer the patient to the floor.  Hopefully can discharge tomorrow if he is eating well and stronger.  Attempted to reach patient's wife without any luck.

## 2019-03-14 NOTE — Progress Notes (Signed)
Was able to reach the patient's ex-wife, Adrian [name], who is still close friend of the patient and willing to help take care of him.  She would not be able to pick them up or help take care of him tomorrow so hopefully we discharge can happen on Sunday.  Patient has no other means of support in town.  Work with physical therapy, occupational therapy to have goals of care.  Work with speech therapy on swallowing.  Plan for outpatient rehab arranged per neurology.  Updated her on the treatments by vascular surgery and neurology and their recommendations.  She did not recall hearing from anybody in a while except for feedback through the nursing.  She expressed appreciation.

## 2019-03-14 NOTE — Progress Notes (Signed)
PAL SHELL [id number] [birth year]  CARE TEAM:  PCP: [name], MD  Outpatient Care Team: Patient Care Team: [name], MD as PCP - General (Family Medicine) Sueanne Margarita, MD as PCP - Cardiology (Cardiology) Sueanne Margarita, MD as Consulting Physician (Cardiology) Michael Boston, MD as Consulting Physician (General Surgery) Irene Shipper, MD as Consulting Physician (Gastroenterology) Izora Gala, MD as Consulting Physician (Otolaryngology) Garvin Fila, MD as Consulting Physician (Neurology)  Inpatient Treatment Team: Treatment Team: Attending Provider: Michael Boston, MD; Registered Nurse: Fortino Sic, RN; Technician: Wylene Men, [place]; Consulting Physician: Michael Boston, MD; Consulting Physician: Nolon Nations, MD; Rounding Team: [name] Radiology, MD; Case Manager: Reinaldo Raddle, RN; Rounding Team: Stroke, Md, MD; Registered Nurse: Delsa Bern, RN; Utilization Review: Sindy Guadeloupe, RN   Problem List:   Principal Problem:   Middle cerebral artery embolism, left Active Problems:   Expressive aphasia   NICM (nonischemic cardiomyopathy) ([place])   Diverticulitis of large intestine with abscess   History of radiation therapy   Smokes tobacco daily   Hypothyroidism   CAD (coronary artery disease), native coronary artery   Chronic atrial fibrillation   Current use of long term anticoagulation   Tonsil cancer [facility])   History of chemotherapy   Diverticular stricture ([place])   Dysphagia   Hypokalemia  03/07/2019  POST-OPERATIVE DIAGNOSIS:  SIGMOID DIVERTICULITIS WITH HISTORY OF ABSCESS  PROCEDURE:   XI ROBOTIC ASSISTED LOW ANTERIOR RESECTION RIGID PROCTOSCOPY ASSESSMENT OF TISSUE PERFUSION BY FIREFLY IMMUNOFLUORESCNECE ROBOTIC ASSISTED APPENDECTOMY  SURGEON:  [name], MD, FACS, [place]  03/09/2019  Interventional Radiology S/P bilateral common carotid and RT vertbral arteriograms followed by attempted  revascularization of occluded Lt ICA prox . MCA revascularization via collaterals TICI 2b to 2C .  Assessment  Stabilizing status post stroke cerebral artery with expressive aphasia & dysphagia.   [facility] Stay = 7 days)  Plan:  -MBS decent - start Dysphagia 1 diet per speech therapy  -hopefully d/c corpak if PO intake decent.  Bowel functioning - OK to adv diet as tolerated to solids once speech Tx determines safest course.   Assuming Dys1 to Dys3 evetually  -Continue rehab in the hopes that aphasia and dysphasia will continue to improve this hospitalization.    -Right groin swelling c/w pseudoanerysm  Thrombin injection done vascular surgery supervision.  Not worsening on full anticoagulation.  OK to mobilize according to vascular surgery  --pathology -consistent with diverticulitis.  Benign.  Discussed with patient. OK to d/c o/w from surgery standpoint  -full gtt anticoagulation.  Switch to Eliquis if Speech Tx okays swallowing pills  -Expressive aphasia persists but slightly improving. - agree w speech therapy, PT/OT, anticoagulation  -hypokalemia - replaced  -VTE prophylaxis- SCDs, etc  -mobilize as tolerated to help recovery  Discussed situation with Dr. Jaynee Eagles with neurology yesterday afternoon.  They will help follow plans for disposition.  Inpatient rehab versus home health depending on how he recovers.  I am told that rehab is aware and following.  Did a more formal rehab consult to see what they feel  30 minutes spent in review, evaluation, examination, counseling, and coordination of care.  More than 50% of that time was spent in counseling.  I updated the patient's status to the patient and nurse.  Recommendations were made.  Questions were answered.  They expressed understanding & appreciation.   03/14/2019    Subjective: (Chief complaint)  Denies much pain.  Mobilize little bit.  Mero modified barium swallow done yesterday afternoon.  Not perfect but  markedly improved.  Cleareed to starting dysphagia 1 diet    Objective:  Vital signs:  Vitals:   03/14/19 0300 03/14/19 0400 03/14/19 0500 03/14/19 0600  BP: (!) 95/59 (!) 109/49 106/65 117/65  Pulse:    60  Resp: 15 16 12 16   Temp:  98.9 F (37.2 C)    TempSrc:  Oral    SpO2:    97%  Weight:   61.2 kg   Height:        Last BM Date: 03/13/19  Intake/Output   Yesterday:  08/06 0701 - 08/07 0700 In: 3279.3 [I.V.:826.3; NG/GT:2453] Out: 1100 [Urine:1100] This shift:  Total I/O In: 835.7 [I.V.:175.7; NG/GT:660] Out: 850 [Urine:850]  Bowel function:  Flatus: YES  BM:  YES  Drain: (No drain)   Physical Exam:  General: Pt awake/alert/oriented x4 in no acute distress Eyes: PERRL, normal EOM.  Sclera clear.  No icterus  Neuro: CN II-XII intact w/o focal sensory/motor deficits.  Expressive aphasia mild at this point.  Definitely improved this week.    Lymph: No head/neck/groin lymphadenopathy Psych:  No delerium/psychosis/paranoia HENT: Normocephalic, Mucus membranes moist.  No thrush Neck: Supple, No tracheal deviation Chest: No chest wall pain w good excursion CV:  Pulses intact.  Regular rhythm MS: Normal AROM mjr joints.  No obvious deformity  Abdomen: Soft.  Nondistended.  Nontender.  Incisions w normal healing ridges. Nontender. No cellulitis or abscess.  No evidence of peritonitis.  No incarcerated hernias.  Right groin more flat with very mild bulging &  moderate old ecchymosis.  No oozing/bleeding Ext:   No deformity.  No mjr edema.  No cyanosis Skin: No petechiae / purpura  Results:   Cultures: Recent Results (from the past 720 hour(s))  SARS Coronavirus 2 (Performed in [place] hospital lab)     Status: None   Collection Time: 03/04/19  9:47 AM   Specimen: Nasal Swab  Result Value Ref Range Status   SARS Coronavirus 2 NEGATIVE NEGATIVE Final    Comment: (NOTE) SARS-CoV-2 target nucleic acids are NOT DETECTED. The SARS-CoV-2 RNA is generally  detectable in upper and lower respiratory specimens during the acute phase of infection. Negative results do not preclude SARS-CoV-2 infection, do not rule out co-infections with other pathogens, and should not be used as the sole basis for treatment or other patient management decisions. Negative results must be combined with clinical observations, patient history, and epidemiological information. The expected result is Negative. Fact Sheet for Patients: SugarRoll.be Fact Sheet for Healthcare Providers: https://www.woods-mathews.com/ This test is not yet approved or cleared by the [place] FDA and  has been authorized for detection and/or diagnosis of SARS-CoV-2 by FDA under an Emergency Use Authorization (EUA). This EUA will remain  in effect (meaning this test can be used) for the duration of the COVID-19 declaration under Section 56 4(b)(1) of the Act, 21 U.S.C. section 360bbb-3(b)(1), unless the authorization is terminated or revoked sooner. Performed at [facility], [place] [address], [place], [place] [address]   MRSA PCR Screening     Status: None   Collection Time: 03/08/19 10:44 PM   Specimen: Nasal Mucosa; Nasopharyngeal  Result Value Ref Range Status   MRSA by PCR NEGATIVE NEGATIVE Final    Comment:        The GeneXpert MRSA Assay (FDA approved for NASAL specimens only), is one component of a comprehensive MRSA colonization surveillance program. It is not  intended to diagnose MRSA infection nor to guide or monitor treatment for MRSA infections. Performed at [facility], [place] [address], [place], [place] [address]     Labs: Results for orders placed or performed during the hospital encounter of 03/07/19 (from the past 48 hour(s))  Heparin level (unfractionated)     Status: Abnormal   Collection Time: 03/12/19  9:37 PM  Result Value Ref Range   Heparin Unfractionated 0.16 (L) 0.30 - 0.70 IU/mL    Comment:  (NOTE) If heparin results are below expected values, and patient dosage has  been confirmed, suggest follow up testing of antithrombin III levels. Performed at [facility], [place] [address] [place], [place] [address]   CBC     Status: Abnormal   Collection Time: 03/13/19  6:09 AM  Result Value Ref Range   WBC 3.6 (L) 4.0 - 10.5 K/uL   RBC 3.05 (L) 4.22 - 5.81 MIL/uL   Hemoglobin 9.4 (L) 13.0 - 17.0 g/dL   HCT 26.9 (L) 39.0 - 52.0 %   MCV 88.2 80.0 - 100.0 fL   MCH 30.8 26.0 - 34.0 pg   MCHC 34.9 30.0 - 36.0 g/dL   RDW 15.4 11.5 - 15.5 %   Platelets 141 (L) 150 - 400 K/uL   nRBC 0.0 0.0 - 0.2 %    Comment: Performed at [facility], [place] [address] [place], [place] [address]  Heparin level (unfractionated)     Status: None   Collection Time: 03/13/19  6:09 AM  Result Value Ref Range   Heparin Unfractionated 0.37 0.30 - 0.70 IU/mL    Comment: (NOTE) If heparin results are below expected values, and patient dosage has  been confirmed, suggest follow up testing of antithrombin III levels. Performed at [facility], [place] [address], [place], [place] [address]   Heparin level (unfractionated)     Status: None   Collection Time: 03/13/19  3:42 PM  Result Value Ref Range   Heparin Unfractionated 0.36 0.30 - 0.70 IU/mL    Comment: (NOTE) If heparin results are below expected values, and patient dosage has  been confirmed, suggest follow up testing of antithrombin III levels. Performed at [facility], [place] [address], [place], [place] [address]     Imaging / Studies: Dg Swallowing Func-speech Pathology  Result Date: 03/13/2019 Objective Swallowing Evaluation: Type of Study: MBS-Modified Barium Swallow Study  Patient Details Name: Adrian [name] MRN: [id number] Date of Birth: 04/14/1956 Today's Date: 03/13/2019 Time: SLP Start Time (ACUTE ONLY): 46 -SLP Stop Time (ACUTE ONLY): 1435 SLP Time Calculation (min) (ACUTE ONLY): 26 min Past Medical History: Past Medical  History: Diagnosis Date  AF (paroxysmal atrial fibrillation) ([place]) 11/29/2018  Atrial flutter ([place]) 10/12/2015  CAD (coronary artery disease), native coronary artery 12/06/2015  a. 10/2015 MV: EF  37%, reversible defect inferior apex, intermediate risk findings; b. 10/2015 Cath: 20% mid RCA.  Colonic diverticular abscess   Diverticulitis   History of chemotherapy 2005  Cisplatin  Hypothyroidism   NICM (nonischemic cardiomyopathy) ([place]) 10/14/2015  a. Tachy mediated?;  b. Echo 3/17 - Mild concentric LVH, EF 30-35%, anteroseptal, anterior, anterolateral, apical anterior, lateral hypokinesis, trivial MR, mild to moderately reduced RVSF; c. LHC 3/17 - mRCA 20%  Paroxysmal atrial flutter ([place])   a. TEE 3/17 with ? LAA clot-->s/p TEE/DCCV 11/24/2015  Radiation NOv.3,2005-Dec. 15, 2005  6810 cGy in 30 fractions  Tonsil cancer [facility]) 2005  Dr Romeo Rabon.  XRT Past  Surgical History: Past Surgical History: Procedure Laterality Date  APPENDECTOMY N/A 03/07/2019  Procedure: ROBOTIC ASSISTED APPENDECTOMY;  Surgeon: Michael Boston, MD;  Location: [facility];  Service: General;  Laterality: N/A;  CARDIAC CATHETERIZATION N/A 10/18/2015  Procedure: Left Heart Cath and Coronary Angiography;  Surgeon: Burnell Blanks, MD;  Location: [place] [facility];  Service: Cardiovascular;  Laterality: N/A;  CARDIOVERSION N/A 11/24/2015  Procedure: CARDIOVERSION;  Surgeon: Josue Hector, MD;  Location: [place];  Service: Cardiovascular;  Laterality: N/A;  CYSTOSCOPY WITH STENT PLACEMENT Bilateral 03/07/2019  Procedure: CYSTOSCOPY WITH BILATERAL FIREFLY INJECTION;  Surgeon: Ardis Hughs, MD;  Location: [facility];  Service: Urology;  Laterality: Bilateral;  GASTROSTOMY TUBE PLACEMENT  07/04/2004  IR - G tube for tonsilar cancer  IR ANGIO INTRA EXTRACRAN SEL COM CAROTID INNOMINATE UNI R MOD SED  03/09/2019  IR ANGIO VERTEBRAL SEL VERTEBRAL UNI R MOD SED  03/09/2019  IR CT HEAD LTD  03/09/2019  IR PERCUTANEOUS ART THROMBECTOMY/INFUSION  INTRACRANIAL INC DIAG ANGIO  03/09/2019  IR RADIOLOGIST EVAL & MGMT  12/18/2018  LAMINECTOMY    C5/placement of steel plate  NECK SURGERY  2003  replaced disk  RADIOLOGY WITH ANESTHESIA N/A 03/08/2019  Procedure: IR WITH ANESTHESIA;  Surgeon: Luanne Bras, MD;  Location: [place];  Service: Radiology;  Laterality: N/A;  TEE WITHOUT CARDIOVERSION N/A 10/15/2015  Procedure: TRANSESOPHAGEAL ECHOCARDIOGRAM (TEE);  Surgeon: Larey Dresser, MD;  Location: [place];  Service: Cardiovascular;  Laterality: N/A;  TEE WITHOUT CARDIOVERSION N/A 11/24/2015  Procedure: TRANSESOPHAGEAL ECHOCARDIOGRAM (TEE);  Surgeon: Josue Hector, MD;  Location: [facility];  Service: Cardiovascular;  Laterality: N/A;  XI ROBOTIC ASSISTED COLOSTOMY TAKEDOWN N/A 03/07/2019  Procedure: XI ROBOTIC ASSISTED LOW ANTERIOR RESECTION, RIGID PROCTOSCOPY;  Surgeon: Michael Boston, MD;  Location: [facility];  Service: General;  Laterality: N/A; HPI: REGINALD WEIDA is a 63 y.o. male coronary artery disease, tonsillar ca in remission, atrial fibrillation on anticoagulation which was held for surgery since last Tuesday, transferred emergently from [facility] where he was admitted for diverticulitis of the large intestine with abscess requiring exploratory laparotomy, low anterior resection rigid proctoscopy, appendectomy, last seen normal at 7:30 PM on 03/08/2019 at [facility] when it was noticed that he had difficulty with speech- speech did not make sense. CTA head and neck was also done which revealed a left cervical carotid occlusion as well as left MCA occlusion. S/P bilateral common carotid and RT vertbral arteriograms followed by attempted revascularization of occluded Lt ICA prox .MCA revascularization via collaterals TICI 2b to 2C . MRI shows Restricted diffusion in the left MCA middle to anterior division territory involving most of the insula, left operculum. Repeat MBS today for possible po initiation.  Subjective: pt alert,  aphasic Assessment / Plan / Recommendation CHL IP CLINICAL [name] 03/13/2019 Clinical Impression Pt's swallow function has improved since initial MBS. Cohesion, control and oral transit of boluses was more coordinated, improved timeliness with effective lingual ROM against hard palate to propel boluses. Laryngeal closure and inversion of epiglottis continues to be reduced, however less than prior MBS. Laryngeal intrusion was reduced to penetration versus aspiration although reaching his vocal cords with honey thick. Nectar thick was penetrated into anterior portion of vestibule and from spill over from the pyriform sinuses. He  senses penetrates more than half the time but when cued to cough was able to clear inconsistently. Multiple postures/strategies performed including chin tuck, head turn and breath hold with which were less safe  with thin liquids. Vallecular and pyriform sinus residue was moderate throughout and pt performed 3-4 subsequent swallows without cues. Residue was not signiricantly increased with nectar thick A combination of head turn to the right, multiple swallows and inconsistent cough/throat clear and teaspoon administration only with nectar is recommended and puree solids (D1). He will likely continue to experience some degree of penetration with these recommendations however liquid modificantion, posture, bolus control hopefully will decrease his risk. He is also ambulatory with assist/supervision which is advantageous for pulmonary function.  SLP Visit Diagnosis Dysphagia, pharyngeal phase (R13.13) Attention and concentration deficit following -- Frontal lobe and executive function deficit following -- Impact on safety and function Moderate aspiration risk;Severe aspiration risk   CHL IP TREATMENT RECOMMENDATION 03/13/2019 Treatment Recommendations Therapy as outlined in treatment plan below   Prognosis 03/13/2019 Prognosis for Safe Diet Advancement Good Barriers to Reach Goals Other (Comment)  Barriers/Prognosis Comment -- CHL IP DIET RECOMMENDATION 03/13/2019 SLP Diet Recommendations Dysphagia 1 (Puree) solids;Nectar thick liquid Liquid Administration via Spoon Medication Administration Crushed with puree Compensations Slow rate;Small sips/bites;Multiple dry swallows after each bite/sip;Clear throat intermittently;Hard cough after swallow;Other (Comment) Postural Changes Seated upright at 90 degrees;Remain semi-upright after after feeds/meals (Comment)   CHL IP OTHER RECOMMENDATIONS 03/13/2019 Recommended Consults -- Oral Care Recommendations Oral care BID Other Recommendations Have oral suction available   CHL IP FOLLOW UP RECOMMENDATIONS 03/13/2019 Follow up Recommendations Inpatient Rehab   CHL IP FREQUENCY AND DURATION 03/13/2019 Speech Therapy Frequency (ACUTE ONLY) min 2x/week Treatment Duration 2 weeks      CHL IP ORAL PHASE 03/13/2019 Oral Phase WFL Oral - Pudding Teaspoon -- Oral - Pudding Cup -- Oral - Honey Teaspoon -- Oral - Honey Cup WFL Oral - Nectar Teaspoon NT Oral - Nectar Cup WFL Oral - Nectar Straw -- Oral - Thin Teaspoon NT Oral - Thin Cup WFL Oral - Thin Straw -- Oral - Puree NT Oral - Mech Soft -- Oral - Regular -- Oral - Multi-Consistency -- Oral - Pill -- Oral Phase - Comment --  CHL IP PHARYNGEAL PHASE 03/13/2019 Pharyngeal Phase Impaired Pharyngeal- Pudding Teaspoon -- Pharyngeal -- Pharyngeal- Pudding Cup -- Pharyngeal -- Pharyngeal- Honey Teaspoon -- Pharyngeal -- Pharyngeal- Honey Cup Penetration/Aspiration during swallow;Penetration/Apiration after swallow;Pharyngeal residue - valleculae;Pharyngeal residue - pyriform;Reduced epiglottic inversion;Reduced airway/laryngeal closure Pharyngeal Material enters airway, CONTACTS cords and then ejected out Pharyngeal- Nectar Teaspoon NT Pharyngeal -- Pharyngeal- Nectar Cup Pharyngeal residue - pyriform;Pharyngeal residue - valleculae;Penetration/Aspiration during swallow;Penetration/Apiration after swallow;Reduced airway/laryngeal  closure;Reduced epiglottic inversion Pharyngeal Material enters airway, CONTACTS cords and then ejected out;Material enters airway, remains ABOVE vocal cords and not ejected out Pharyngeal- Nectar Straw -- Pharyngeal -- Pharyngeal- Thin Teaspoon NT Pharyngeal -- Pharyngeal- Thin Cup Penetration/Aspiration during swallow;Reduced airway/laryngeal closure;Reduced pharyngeal peristalsis;Pharyngeal residue - valleculae;Pharyngeal residue - pyriform Pharyngeal -- Pharyngeal- Thin Straw -- Pharyngeal -- Pharyngeal- Puree NT Pharyngeal -- Pharyngeal- Mechanical Soft -- Pharyngeal -- Pharyngeal- Regular -- Pharyngeal -- Pharyngeal- Multi-consistency -- Pharyngeal -- Pharyngeal- Pill -- Pharyngeal -- Pharyngeal Comment --  CHL IP CERVICAL ESOPHAGEAL PHASE 03/13/2019 Cervical Esophageal Phase WFL Pudding Teaspoon -- Pudding Cup -- Honey Teaspoon -- Honey Cup WFL Nectar Teaspoon NT Nectar Cup WFL Nectar Straw -- Thin Teaspoon NT Thin Cup WFL Thin Straw -- Puree NT Mechanical Soft -- Regular -- Multi-consistency -- Pill -- Cervical Esophageal Comment -- Houston Siren 03/13/2019, 8:38 PM Orbie Pyo Litaker M.Ed Risk analyst [phone] Office [phone]  Vas [place] Groin Pseudoaneurysm  Result Date: 03/13/2019  ARTERIAL PSEUDOANEURYSM  Exam: Right groin Indications: follow up thrombin injection 03/12/19. Performing Technologist: June Leap RDMS, RVT  Examination Guidelines: A complete evaluation includes B-mode imaging, spectral Doppler, color Doppler, and power Doppler as needed of all accessible portions of each vessel. Bilateral testing is considered an integral part of a complete examination. Limited examinations for reoccurring indications may be performed as noted. +------------+----------+---------+------+----------+  Right Duplex PSV (cm/s) Waveform  Plaque Comment(s)  +------------+----------+---------+------+----------+  CFA                     triphasic                     +------------+----------+---------+------+----------+  Prox SFA                triphasic                    +------------+----------+---------+------+----------+ Right Vein comments:patent  Summary: Right groin pseudoaneurysm injection remains successful. No evidence of reopened pseudo. Diagnosing physician: Monica Martinez MD Electronically signed by Monica Martinez MD on 03/13/2019 at 2:51:28 PM.   --------------------------------------------------------------------------------    Final    Vas [place] Groin Pseudoaneurysm  Result Date: 03/12/2019  ARTERIAL PSEUDOANEURYSM  Exam: Right groin Indications: Patient complains of palpable knot. History: Acute CVA (left MCA infarct) s/p diagnostic cerebral arteriogram with attempted revascularization of occluded proximal left ICA achieving a TICI 2b to 2c revascularization 03/09/2019. Comparison Study: No prior studies. Performing Technologist: Oliver Hum RVT  Examination Guidelines: A complete evaluation includes B-mode imaging, spectral Doppler, color Doppler, and power Doppler as needed of all accessible portions of each vessel. Bilateral testing is considered an integral part of a complete examination. Limited examinations for reoccurring indications may be performed as noted. +------------+----------+---------+------+----------+  Right Duplex PSV (cm/s) Waveform  Plaque Comment(s)  +------------+----------+---------+------+----------+  CFA             144     triphasic                    +------------+----------+---------+------+----------+  PFA              82     triphasic                    +------------+----------+---------+------+----------+  Prox SFA         99     triphasic                    +------------+----------+---------+------+----------+  Findings: An area with well defined borders was visualized arising off of the CFA with ultrasound characteristics of a pseudoaneurysm. The neck measures approximately 0.5 cm wide and 0.4 cm long. Multi-chambered  pseudoaneurysm with a neck that measures 0.43 cm long and 0.5 cm wide.  Diagnosing physician: Curt Jews MD Electronically signed by Curt Jews MD on 03/12/2019 at 11:08:21 AM.   --------------------------------------------------------------------------------    Final    Vas [place] Lower Ext Arterial Pseudo Injection  Result Date: 03/13/2019  ARTERIAL PSEUDOANEURYSM  Indications: Patient complains of palpable knot. Performing Technologist: Oliver Hum RVT  Examination Guidelines: A complete evaluation includes B-mode imaging, spectral Doppler, color Doppler, and power Doppler as needed of all accessible portions of each vessel. Bilateral testing is considered an integral part of a complete examination. Limited examinations for reoccurring indications may be performed as noted.  Findings: An area was visualized with ultrasound characteristics of a  pseudoaneurysm. The neck measures approximately 0.5 cm wide and 0.4 cm long.  Summary: Successful thrombin injection with closure of the right common femoral artery pseudoaneurysm performed by Dr. Donnetta Hutching and Dr. Carlis Abbott. Post injection, distal arterial flow demonstrates patent, triphasic waveforms. Diagnosing physician: Monica Martinez MD Electronically signed by Monica Martinez MD on 03/13/2019 at 2:36:03 PM.   --------------------------------------------------------------------------------    Final     Medications / Allergies: per chart  Antibiotics: Anti-infectives (From admission, onward)   Start     Dose/Rate Route Frequency Ordered Stop   03/09/19 0035  ceFAZolin (ANCEF) 2-4 GM/100ML-% IVPB    Note to Pharmacy: Luis Abed   : cabinet override      03/09/19 0035 03/09/19 1244   03/07/19 2200  cefoTEtan (CEFOTAN) 2 g in sodium chloride 0.9 % 100 mL IVPB     2 g 200 mL/hr over 30 Minutes Intravenous Every 12 hours 03/07/19 1700 03/07/19 2210   03/07/19 1434  clindamycin (CLEOCIN) 900 mg, gentamicin (GARAMYCIN) 240 mg in sodium chloride 0.9 % 1,000 mL  for intraperitoneal lavage  Status:  Discontinued       As needed 03/07/19 1434 03/07/19 1456   03/07/19 1400  neomycin (MYCIFRADIN) tablet 1,000 mg  Status:  Discontinued     1,000 mg Oral 3 times per day 03/07/19 1026 03/07/19 1641   03/07/19 1400  metroNIDAZOLE (FLAGYL) tablet 1,000 mg  Status:  Discontinued     1,000 mg Oral 3 times per day 03/07/19 1026 03/07/19 1641   03/07/19 1030  cefoTEtan (CEFOTAN) 2 g in sodium chloride 0.9 % 100 mL IVPB     2 g 200 mL/hr over 30 Minutes Intravenous On call to O.R. 03/07/19 1026 03/07/19 1228   03/07/19 0600  clindamycin (CLEOCIN) 900 mg, gentamicin (GARAMYCIN) 240 mg in sodium chloride 0.9 % 1,000 mL for intraperitoneal lavage  Status:  Discontinued      Irrigation To Surgery 03/06/19 1018 03/07/19 1641        Note: Portions of this report may have been transcribed using voice recognition software. Every effort was made to ensure accuracy; however, inadvertent computerized transcription errors may be present.   Any transcriptional errors that result from this process are unintentional.     [name], MD, FACS, MASCRS Gastrointestinal and Minimally Invasive Surgery    [address], [place], [place] [address] [phone] Main / Paging [phone] Fax

## 2019-03-14 NOTE — Progress Notes (Signed)
  Speech Language Pathology Treatment: Dysphagia  Patient Details Name: Adrian [name] MRN: [id number] DOB: May 15, 1956 Today's Date: 03/14/2019 Time: 1211-1240 SLP Time Calculation (min) (ACUTE ONLY): 29 min  Assessment / Plan / Recommendation Clinical Impression  SLP followed up for PO diet observation and reinforcement of compensatory swallow strategies from MBSS performed yesterday. Pt upright, with lunch meal, implementing head turn to the right independently. SLP reinforced multiple swallows, throat clear and cough intermittently, and nectar thick liquids via teaspoon to maximize airway protection and swallow efficiency. Pt with wet sounding cough and intermittent wet vocal quality. With use of strategies vocal quality improved. Education reinforced regarding rationale for use of each strategy. Pt agreeable and cooperative. PO intake noted around 25 percent of lunch meal. SLP to follow up.   HPI HPI: Adrian [name] is a 63 y.o. male coronary artery disease, tonsillar ca in remission, atrial fibrillation on anticoagulation which was held for surgery since last Tuesday, transferred emergently from [facility] long hospital where he was admitted for diverticulitis of the large intestine with abscess requiring exploratory laparotomy, low anterior resection rigid proctoscopy, appendectomy, last seen normal at 7:30 PM on 03/08/2019 at [facility] when it was noticed that he had difficulty with speech- speech did not make sense. CTA head and neck was also done which revealed a left cervical carotid occlusion as well as left MCA occlusion. S/P bilateral common carotid and RT vertbral arteriograms followed by attempted revascularization of occluded Lt ICA prox .MCA revascularization via collaterals TICI 2b to 2C . MRI shows Restricted diffusion in the left MCA middle to anterior division territory involving most of the insula, left operculum.       SLP Plan  Continue with current plan of care        Recommendations  Diet recommendations: Dysphagia 1 (puree);Nectar-thick liquid Liquids provided via: Teaspoon Medication Administration: Crushed with puree Supervision: Patient able to self feed;Full supervision/cueing for compensatory strategies Compensations: Slow rate;Small sips/bites;Multiple dry swallows after each bite/sip;Clear throat intermittently;Hard cough after swallow;Other (Comment);Effortful swallow Postural Changes and/or Swallow Maneuvers: Seated upright 90 degrees;Upright 30-60 min after meal                General recommendations: Rehab consult Oral Care Recommendations: Oral care BID Follow up Recommendations: Inpatient Rehab SLP Visit Diagnosis: Dysphagia, pharyngeal phase (R13.13) Plan: Continue with current plan of care       [place], [place] 03/14/2019, 12:45 PM

## 2019-03-14 NOTE — Progress Notes (Addendum)
ANTICOAGULATION CONSULT NOTE - Follow Up Consult  Pharmacy Consult for heparin > apixaban Indication: atrial fibrillation and stroke  Labs: Recent Labs    03/12/19 0321  03/13/19 0609 03/13/19 1542 03/14/19 0602  HGB 9.4*  --  9.4*  --  9.0*  HCT 26.7*  --  26.9*  --  26.3*  PLT 152  --  141*  --  146*  HEPARINUNFRC 0.24*   < > 0.37 0.36 0.38  CREATININE 0.47*  --   --   --  0.59*   < > = values in this interval not displayed.    Assessment:  63yo male continuing on heparin for hx afib/stroke. Pharmacy consulted to transition back to apixaban. CBC low but stable, SCr stable WNL. No bleed issues reported.  Plan: D/c heparin at time of AM apixaban dose - discussed plan with RN Apixaban 5mg  PO BID Monitor CBC, s/sx bleeding  Elicia Lamp, PharmD, BCPS Please check AMION for all [place] contact numbers Clinical Pharmacist 03/14/2019 9:27 AM

## 2019-03-14 NOTE — Discharge Summary (Signed)
Physician Discharge Summary    Patient ID: Adrian [name] MRN: [id number] DOB/AGE: January 28, 1956  63 y.o.  Patient Care Team: Kristian Covey, MD as PCP - General (Family Medicine) Quintella Reichert, MD as PCP - Cardiology (Cardiology) Quintella Reichert, MD as Consulting Physician (Cardiology) Karie Soda, MD as Consulting Physician (General Surgery) Hilarie Fredrickson, MD as Consulting Physician (Gastroenterology) Serena Colonel, MD as Consulting Physician (Otolaryngology) Micki Riley, MD as Consulting Physician (Neurology)  Admit date: 03/07/2019  Discharge date: 03/16/2019 Hospital Stay = 10 days    Discharge Diagnoses:  Principal Problem:   Middle cerebral artery embolism, left Active Problems:   Expressive aphasia   NICM (nonischemic cardiomyopathy) (HCC)   Diverticulitis of large intestine with abscess   History of radiation therapy   Smokes tobacco daily   Hypothyroidism   CAD (coronary artery disease), native coronary artery   Chronic atrial fibrillation   Current use of long term anticoagulation   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  Ultrasound-guided thrombin injection of right common femoral artery pseudoaneurysm  Surgeon: Dr. Cephus Shelling and Dr. Tawanna Cooler Early  03/07/2019  POST-OPERATIVE DIAGNOSIS:  SIGMOID DIVERTICULITIS WITH HISTORY OF ABSCESS  PROCEDURE:   XI ROBOTIC ASSISTED LOW ANTERIOR RESECTION RIGID PROCTOSCOPY ASSESSMENT OF TISSUE PERFUSION BY FIREFLY IMMUNOFLUORESCNECE ROBOTIC ASSISTED APPENDECTOMY  SURGEON:  Ardeth Sportsman, MD, FACS, MASCRS    Consults: Code stroke pain  Neurology  Vascular surgery.  Speech pathology.  Physical occupational therapy.  Hospital Course:   Patient with current diverticulitis.   Underwent robotic resection.  Initially did well.  Then had difficulty speaking the night of postoperative day 2.  Stroke suspected.  Called stroke called.  Team involved.  Patient transferred over to [facility].  Underwent angiography and found to have a middle cerebral artery embolus.  Anticoagulated.  Watch in intensive care unit.  Permissive hypertension done.  Neurology was consulted.  Dr. Pearlean [name] initially involved and then Dr. Daisy Blossom, his partner.  He had obvious expressive aphasia but no focal weakness otherwise.  Underwent swallow evaluation which he failed.  Had CorPak feeding tube placed.  Began on tube feeds.  Had increasing swelling in his right groin.  Pseudoaneurysm diagnosed and treated with thrombin injection.  Stabilized.  He had repeat swallow study done 8/6.  Improved.  Started on dysphagia 1 diet.  Speech therapy intimately involved.  Physical occupational therapy involved.  Initially thought to benefit from inpatient rehab but made improvements.  Neurology felt safe to transition back to oral anticoagulation with aggressive outpatient rehab with follow-up with neurology.  Patient's ex-wife who is his only family in town offered to help take care of him.   Postoperatively, the patient gradually mobilized and advanced to a solid diet.  Pain and other symptoms were treated aggressively.  By the time of discharge, the patient was walking well the hallways, eating dysphasia food having flatus.  Pain was well-controlled on an oral medications.  No expansion of pseudoaneurysm in right groin.  Ecchymosis and hemoglobin stable.  No deterioration on oral anticoagulation.  Based on meeting discharge criteria and continuing to recover, I felt it was safe for the patient to be discharged from the hospital to further recover with close followup. Postoperative recommendations were discussed in detail.  They are written  as well.  Discharged Condition: Guarded but improved.  Discharge Exam:  Blood pressure 96/60, pulse 67, temperature 97.6 F (36.4 C), temperature source Oral, resp. rate 18, height 5\' 8"  (1.727 m), weight 61.2 kg, SpO2 100 %.  General: Pt awake/alert/oriented x4 in No acute distress Eyes: PERRL, normal EOM.  Sclera clear.  No icterus  Neuro: CN II-XII intact w/o focal sensory/motor extremity deficits.  Mild expressive aphasia  Lymph: No head/neck/groin lymphadenopathy Psych:  No delerium/psychosis/paranoia HENT: Normocephalic, Mucus membranes moist.  No thrush Neck: Supple, No tracheal deviation Chest: No chest wall pain w good excursion CV:  Pulses intact.  Regular rhythm MS: Normal AROM mjr joints.  No obvious deformity Abdomen: Soft.  Nondistended.  Nontender.  Incision c/d/i.  Old Right sided flank ecchymosisNo evidence of peritonitis.  No incarcerated hernias. Ext:  R groin swelling & eccymosis stable.  No mjr edema.  No cyanosis Skin: No petechiae / purpura   Disposition:   Follow-up Information    Karie Soda, MD. Schedule an appointment as soon as possible for a visit in 3 weeks.   Specialty: General Surgery Why: To follow up after your operation, To follow up after your hospital stay Contact information: [address] [place] [address] [phone]        [facility] Follow up in 4 week(s).   Specialty: Neurology Why: after rehab stay. stroke clinic. office will call with appt date and time Contact information: [address] [place] [address] [phone]       [facility] Call.   Specialty: Rehabilitation Why: Rehab center will call you for physical, occupational and speech therapy appoinments Contact information: [address] [phone] [place] [address] [phone]          Discharge disposition: 01-Home or Self Care       Discharge Instructions    Ambulatory referral to  Neurology   Complete by: As directed    Follow up with stroke clinic NP (Jessica Vanschaick or Darrol Angel, if both not available, consider Dr. Delia Heady, Dr. Jamelle Rushing, or Dr. Naomie Dean) at [facility] in about 4 weeks.  Neuro signing off today. Will likely be a few more days before he goes to rehab or home.   Ambulatory referral to Occupational Therapy   Complete by: As directed    Ambulatory referral to Physical Therapy   Complete by: As directed    Ambulatory referral to Speech Therapy   Complete by: As directed    Call MD for:   Complete by: As directed    FEVER > 101.5 F  (temperatures < 101.5 F are not significant)   Call MD for:   Complete by: As directed    FEVER > 101.5 F  (temperatures < 101.5 F are not significant)   Call MD for:  extreme fatigue   Complete by: As directed    Call MD for:  extreme fatigue   Complete by: As directed    Call MD for:  persistant dizziness or light-headedness   Complete by: As directed    Call MD for:  persistant dizziness or light-headedness   Complete by: As directed    Call MD for:  persistant nausea and vomiting   Complete by: As directed    Call MD for:  persistant nausea and vomiting   Complete by: As directed    Call MD for:  redness, tenderness, or signs of infection (  pain, swelling, redness, odor or green/yellow discharge around incision site)   Complete by: As directed    Call MD for:  redness, tenderness, or signs of infection (pain, swelling, redness, odor or green/yellow discharge around incision site)   Complete by: As directed    Call MD for:  severe uncontrolled pain   Complete by: As directed    Call MD for:  severe uncontrolled pain   Complete by: As directed    Diet - low sodium heart healthy   Complete by: As directed    Follow recommendations by speech therapy to gradually advance to a low-fat high-fiber diet   Discharge instructions   Complete by: As directed    See Discharge  Instructions If you are not getting better after two weeks or are noticing you are getting worse, contact our office [phone] for further advice.  We may need to adjust your medications, re-evaluate you in the office, send you to the emergency room, or see what other things we can do to help. The clinic staff is available to answer your questions during regular business hours (8:30am-5pm).  Please don't hesitate to call and ask to speak to one of our nurses for clinical concerns.    A surgeon from [facility] is always on call at the hospitals 24 hours/day If you have a medical emergency, go to the nearest emergency room or call 911.   Discharge instructions   Complete by: As directed    See Discharge Instructions If you are not getting better after two weeks or are noticing you are getting worse, contact our office [phone] for further advice.  We may need to adjust your medications, re-evaluate you in the office, send you to the emergency room, or see what other things we can do to help. The clinic staff is available to answer your questions during regular business hours (8:30am-5pm).  Please don't hesitate to call and ask to speak to one of our nurses for clinical concerns.    A surgeon from [facility] is always on call at the hospitals 24 hours/day If you have a medical emergency, go to the nearest emergency room or call 911.   Discharge wound care:   Complete by: As directed    It is good for closed incisions and even open wounds to be washed every day.  Shower every day.  Short baths are fine.  Wash the incisions and wounds clean with soap & water.    You may leave closed incisions open to air if it is dry.   You may cover the incision with clean gauze & replace it after your daily shower for comfort.   Discharge wound care:   Complete by: As directed    It is good for closed incisions and even open wounds to be washed every day.  Shower every day.   Short baths are fine.  Wash the incisions and wounds clean with soap & water.    You may leave closed incisions open to air if it is dry.   You may cover the incision with clean gauze & replace it after your daily shower for comfort.   Driving Restrictions   Complete by: As directed    You may drive when: - you are no longer taking narcotic prescription pain medication - you can comfortably wear a seatbelt - you can safely make sudden turns/stops without pain.   Driving Restrictions   Complete by: As directed  You may drive when: - you are no longer taking narcotic prescription pain medication - you can comfortably wear a seatbelt - you can safely make sudden turns/stops without pain.   Increase activity slowly   Complete by: As directed    Start light daily activities --- self-care, walking, climbing stairs- beginning the day after surgery.  Gradually increase activities as tolerated.  Control your pain to be active.  Stop when you are tired.  Ideally, walk several times a day, eventually an hour a day.   Most people are back to most day-to-day activities in a few weeks.  It takes 4-6 weeks to get back to unrestricted, intense activity. If you can walk 30 minutes without difficulty, it is safe to try more intense activity such as jogging, treadmill, bicycling, low-impact aerobics, swimming, etc. Save the most intensive and strenuous activity for last (Usually 4-8 weeks after surgery) such as sit-ups, heavy lifting, contact sports, etc.  Refrain from any intense heavy lifting or straining until you are off narcotics for pain control.  You will have off days, but things should improve week-by-week. DO NOT PUSH THROUGH PAIN.  Let pain be your guide: If it hurts to do something, don't do it.   Increase activity slowly   Complete by: As directed    Start light daily activities --- self-care, walking, climbing stairs- beginning the day after surgery.  Gradually increase activities as tolerated.   Control your pain to be active.  Stop when you are tired.  Ideally, walk several times a day, eventually an hour a day.   Most people are back to most day-to-day activities in a few weeks.  It takes 4-6 weeks to get back to unrestricted, intense activity. If you can walk 30 minutes without difficulty, it is safe to try more intense activity such as jogging, treadmill, bicycling, low-impact aerobics, swimming, etc. Save the most intensive and strenuous activity for last (Usually 4-8 weeks after surgery) such as sit-ups, heavy lifting, contact sports, etc.  Refrain from any intense heavy lifting or straining until you are off narcotics for pain control.  You will have off days, but things should improve week-by-week. DO NOT PUSH THROUGH PAIN.  Let pain be your guide: If it hurts to do something, don't do it.   Lifting restrictions   Complete by: As directed    If you can walk 30 minutes without difficulty, it is safe to try more intense activity such as jogging, treadmill, bicycling, low-impact aerobics, swimming, etc. Save the most intensive and strenuous activity for last (Usually 4-8 weeks after surgery) such as sit-ups, heavy lifting, contact sports, etc.   Refrain from any intense heavy lifting or straining until you are off narcotics for pain control.  You will have off days, but things should improve week-by-week. DO NOT PUSH THROUGH PAIN.  Let pain be your guide: If it hurts to do something, don't do it.  Pain is your body warning you to avoid that activity for another week until the pain goes down.   Lifting restrictions   Complete by: As directed    If you can walk 30 minutes without difficulty, it is safe to try more intense activity such as jogging, treadmill, bicycling, low-impact aerobics, swimming, etc. Save the most intensive and strenuous activity for last (Usually 4-8 weeks after surgery) such as sit-ups, heavy lifting, contact sports, etc.   Refrain from any intense heavy lifting or  straining until you are off narcotics for pain control.  You  will have off days, but things should improve week-by-week. DO NOT PUSH THROUGH PAIN.  Let pain be your guide: If it hurts to do something, don't do it.  Pain is your body warning you to avoid that activity for another week until the pain goes down.   May shower / Bathe   Complete by: As directed    May shower / Bathe   Complete by: As directed    May walk up steps   Complete by: As directed    May walk up steps   Complete by: As directed    Remove dressing in 72 hours   Complete by: As directed    Make sure all dressings are removed by the third day after surgery.  Leave incisions open to air.  OK to cover incisions with gauze or bandages as desired   Remove dressing in 72 hours   Complete by: As directed    Make sure all dressings are removed by the third day after surgery.  Leave incisions open to air.  OK to cover incisions with gauze or bandages as desired   Sexual Activity Restrictions   Complete by: As directed    You may have sexual intercourse when it is comfortable. If it hurts to do something, stop.   Sexual Activity Restrictions   Complete by: As directed    You may have sexual intercourse when it is comfortable. If it hurts to do something, stop.      Allergies as of 03/14/2019   No Known Allergies     Medication List    TAKE these medications   acetaminophen 325 MG tablet Commonly known as: TYLENOL Take 2 tablets (650 mg total) by mouth every 6 (six) hours as needed for mild pain (or Fever >/= 101).   apixaban 5 MG Tabs tablet Commonly known as: ELIQUIS Take 1 tablet (5 mg total) by mouth 2 (two) times daily.   atorvastatin 40 MG tablet Commonly known as: LIPITOR Take 1 tablet (40 mg total) by mouth daily. Please keep upcoming appt in September for future refills. Thank you What changed:   when to take this  additional instructions   levothyroxine 125 MCG tablet Commonly known as: SYNTHROID  TAKE 1 TABLET BY MOUTH EVERY DAY What changed: when to take this   multivitamin with minerals Tabs tablet Take 1 tablet by mouth daily.   nebivolol 10 MG tablet Commonly known as: Bystolic Take 1 tablet (10 mg total) by mouth daily.   traMADol 50 MG tablet Commonly known as: ULTRAM Take 1-2 tablets (50-100 mg total) by mouth every 6 (six) hours as needed for moderate pain or severe pain.            Discharge Care Instructions  (From admission, onward)         Start     Ordered   03/14/19 0000  Discharge wound care:    Comments: It is good for closed incisions and even open wounds to be washed every day.  Shower every day.  Short baths are fine.  Wash the incisions and wounds clean with soap & water.    You may leave closed incisions open to air if it is dry.   You may cover the incision with clean gauze & replace it after your daily shower for comfort.   03/14/19 1708   03/07/19 0000  Discharge wound care:    Comments: It is good for closed incisions and even open wounds to be washed every  day.  Shower every day.  Short baths are fine.  Wash the incisions and wounds clean with soap & water.    You may leave closed incisions open to air if it is dry.   You may cover the incision with clean gauze & replace it after your daily shower for comfort.   03/07/19 1157          Significant Diagnostic Studies:  Results for orders placed or performed during the hospital encounter of 03/07/19 (from the past 72 hour(s))  CBC     Status: Abnormal   Collection Time: 03/12/19  3:21 AM  Result Value Ref Range   WBC 3.8 (L) 4.0 - 10.5 K/uL   RBC 3.02 (L) 4.22 - 5.81 MIL/uL   Hemoglobin 9.4 (L) 13.0 - 17.0 g/dL   HCT 23.7 (L) 62.8 - 31.5 %   MCV 88.4 80.0 - 100.0 fL   MCH 31.1 26.0 - 34.0 pg   MCHC 35.2 30.0 - 36.0 g/dL   RDW 17.6 16.0 - 73.7 %   Platelets 152 150 - 400 K/uL   nRBC 0.5 (H) 0.0 - 0.2 %    Comment: Performed at [facility], [address] [place], [place]  [address]  Heparin level (unfractionated)     Status: Abnormal   Collection Time: 03/12/19  3:21 AM  Result Value Ref Range   Heparin Unfractionated 0.24 (L) 0.30 - 0.70 IU/mL    Comment: (NOTE) If heparin results are below expected values, and patient dosage has  been confirmed, suggest follow up testing of antithrombin III levels. Performed at [facility], [address] [place], [place] [address]   Basic metabolic panel     Status: Abnormal   Collection Time: 03/12/19  3:21 AM  Result Value Ref Range   Sodium 137 135 - 145 mmol/L   Potassium 3.3 (L) 3.5 - 5.1 mmol/L   Chloride 106 98 - 111 mmol/L   CO2 22 22 - 32 mmol/L   Glucose, Bld 130 (H) 70 - 99 mg/dL   BUN 6 (L) 8 - 23 mg/dL   Creatinine, Ser 6.27 (L) 0.61 - 1.24 mg/dL   Calcium 8.0 (L) 8.9 - 10.3 mg/dL   GFR calc non Af Amer >60 >60 mL/min   GFR calc Af Amer >60 >60 mL/min   Anion gap 9 5 - 15    Comment: Performed at [facility], [address] [place], [place] [address]  Magnesium     Status: None   Collection Time: 03/12/19  3:21 AM  Result Value Ref Range   Magnesium 1.8 1.7 - 2.4 mg/dL    Comment: Performed at [facility], [address], [place], [place] [address]  Heparin level (unfractionated)     Status: Abnormal   Collection Time: 03/12/19  9:37 PM  Result Value Ref Range   Heparin Unfractionated 0.16 (L) 0.30 - 0.70 IU/mL    Comment: (NOTE) If heparin results are below expected values, and patient dosage has  been confirmed, suggest follow up testing of antithrombin III levels. Performed at [facility], [address], [place], [place] [address]   CBC     Status: Abnormal   Collection Time: 03/13/19  6:09 AM  Result Value Ref Range   WBC 3.6 (L) 4.0 - 10.5 K/uL   RBC 3.05 (L) 4.22 - 5.81 MIL/uL   Hemoglobin 9.4 (L) 13.0 - 17.0 g/dL   HCT 16.9 (L) 67.8 -  52.0 %   MCV 88.2 80.0 - 100.0 fL   MCH 30.8 26.0 - 34.0 pg   MCHC 34.9 30.0 - 36.0 g/dL   RDW 16.1 09.6 - 04.5 %    Platelets 141 (L) 150 - 400 K/uL   nRBC 0.0 0.0 - 0.2 %    Comment: Performed at [facility], [address] [place], [place] [address]  Heparin level (unfractionated)     Status: None   Collection Time: 03/13/19  6:09 AM  Result Value Ref Range   Heparin Unfractionated 0.37 0.30 - 0.70 IU/mL    Comment: (NOTE) If heparin results are below expected values, and patient dosage has  been confirmed, suggest follow up testing of antithrombin III levels. Performed at [facility], [address], [place], [place] [address]   Heparin level (unfractionated)     Status: None   Collection Time: 03/13/19  3:42 PM  Result Value Ref Range   Heparin Unfractionated 0.36 0.30 - 0.70 IU/mL    Comment: (NOTE) If heparin results are below expected values, and patient dosage has  been confirmed, suggest follow up testing of antithrombin III levels. Performed at [facility], [address] [place], [place] [address]   Creatinine, serum     Status: Abnormal   Collection Time: 03/14/19  6:02 AM  Result Value Ref Range   Creatinine, Ser 0.59 (L) 0.61 - 1.24 mg/dL   GFR calc non Af Amer >60 >60 mL/min   GFR calc Af Amer >60 >60 mL/min    Comment: Performed at [facility], [address] [place], [place] [address]  CBC     Status: Abnormal   Collection Time: 03/14/19  6:02 AM  Result Value Ref Range   WBC 3.3 (L) 4.0 - 10.5 K/uL   RBC 2.92 (L) 4.22 - 5.81 MIL/uL   Hemoglobin 9.0 (L) 13.0 - 17.0 g/dL   HCT 65.7 (L) 84.6 - 96.2 %   MCV 90.1 80.0 - 100.0 fL   MCH 30.8 26.0 - 34.0 pg   MCHC 34.2 30.0 - 36.0 g/dL   RDW 95.2 (H) 84.1 - 32.4 %   Platelets 146 (L) 150 - 400 K/uL   nRBC 0.0 0.0 - 0.2 %    Comment: Performed at [facility], [address] [place], [place] [address]  Heparin level (unfractionated)     Status: None   Collection Time: 03/14/19  6:02 AM  Result Value Ref Range   Heparin Unfractionated 0.38 0.30 - 0.70 IU/mL    Comment: (NOTE) If heparin  results are below expected values, and patient dosage has  been confirmed, suggest follow up testing of antithrombin III levels. Performed at [facility], [address], [place], [place] [address]   Potassium     Status: None   Collection Time: 03/14/19  6:02 AM  Result Value Ref Range   Potassium 3.9 3.5 - 5.1 mmol/L    Comment: Performed at [facility], [address] [place], [place] [address]  Magnesium     Status: None   Collection Time: 03/14/19  6:02 AM  Result Value Ref Range   Magnesium 2.0 1.7 - 2.4 mg/dL    Comment: Performed at [facility], [address], [place], [place] [address]    No results found.  Past Medical History:  Diagnosis Date  . AF (paroxysmal atrial fibrillation) (HCC) 11/29/2018  . Atrial flutter (HCC) 10/12/2015  .  CAD (coronary artery disease), native coronary artery 12/06/2015   a. 10/2015 MV: EF  37%, reversible defect inferior apex, intermediate risk findings; b. 10/2015 Cath: 20% mid RCA.  . Colonic diverticular abscess   . Diverticulitis   . History of chemotherapy 2005   Cisplatin  . Hypothyroidism   . NICM (nonischemic cardiomyopathy) (HCC) 10/14/2015   a. Tachy mediated?;  b. Echo 3/17 - Mild concentric LVH, EF 30-35%, anteroseptal, anterior, anterolateral, apical anterior, lateral hypokinesis, trivial MR, mild to moderately reduced RVSF; c. LHC 3/17 - mRCA 20%  . Paroxysmal atrial flutter (HCC)    a. TEE 3/17 with ? LAA clot-->s/p TEE/DCCV 11/24/2015  . Radiation NOv.3,2005-Dec. 15, 2005   6810 cGy in 30 fractions  . 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  XRT    Past Surgical History:  Procedure Laterality Date  . APPENDECTOMY N/A 03/07/2019   Procedure: ROBOTIC ASSISTED APPENDECTOMY;  Surgeon: Karie Soda, MD;  Location: [facility] ORS;  Service: General;  Laterality: N/A;  . CARDIAC CATHETERIZATION N/A 10/18/2015   Procedure: Left Heart Cath and Coronary Angiography;  Surgeon: Kathleene Hazel, MD;  Location: [facility]  INVASIVE CV LAB;  Service: Cardiovascular;  Laterality: N/A;  . CARDIOVERSION N/A 11/24/2015   Procedure: CARDIOVERSION;  Surgeon: Wendall Stade, MD;  Location: [facility] ENDOSCOPY;  Service: Cardiovascular;  Laterality: N/A;  . CYSTOSCOPY WITH STENT PLACEMENT Bilateral 03/07/2019   Procedure: CYSTOSCOPY WITH BILATERAL FIREFLY INJECTION;  Surgeon: Crist Fat, MD;  Location: [facility] ORS;  Service: Urology;  Laterality: Bilateral;  . GASTROSTOMY TUBE PLACEMENT  07/04/2004   IR - G tube for tonsilar cancer  . IR ANGIO INTRA EXTRACRAN SEL COM CAROTID INNOMINATE UNI R MOD SED  03/09/2019  . IR ANGIO VERTEBRAL SEL VERTEBRAL UNI R MOD SED  03/09/2019  . IR CT HEAD LTD  03/09/2019  . IR PERCUTANEOUS ART THROMBECTOMY/INFUSION INTRACRANIAL INC DIAG ANGIO  03/09/2019  . IR RADIOLOGIST EVAL & MGMT  12/18/2018  . LAMINECTOMY     C5/placement of steel plate  . NECK SURGERY  2003   replaced disk  . RADIOLOGY WITH ANESTHESIA N/A 03/08/2019   Procedure: IR WITH ANESTHESIA;  Surgeon: Julieanne Cotton, MD;  Location: [facility] OR;  Service: Radiology;  Laterality: N/A;  . TEE WITHOUT CARDIOVERSION N/A 10/15/2015   Procedure: TRANSESOPHAGEAL ECHOCARDIOGRAM (TEE);  Surgeon: Laurey Morale, MD;  Location: [facility] ENDOSCOPY;  Service: Cardiovascular;  Laterality: N/A;  . TEE WITHOUT CARDIOVERSION N/A 11/24/2015   Procedure: TRANSESOPHAGEAL ECHOCARDIOGRAM (TEE);  Surgeon: Wendall Stade, MD;  Location: [facility] ENDOSCOPY;  Service: Cardiovascular;  Laterality: N/A;  . XI ROBOTIC ASSISTED COLOSTOMY TAKEDOWN N/A 03/07/2019   Procedure: XI ROBOTIC ASSISTED LOW ANTERIOR RESECTION, RIGID PROCTOSCOPY;  Surgeon: Karie Soda, MD;  Location: [facility] ORS;  Service: General;  Laterality: N/A;    Social History   Socioeconomic History  . Marital status: Married    Spouse name: Not on file  . Number of children: Not on file  . Years of education: Not on file  . Highest education level: Not on file  Occupational History  . Not on file  Social Needs  .  Financial resource strain: Not on file  . Food insecurity    Worry: Not on file    Inability: Not on file  . Transportation needs    Medical: Not on file    Non-medical: Not on file  Tobacco Use  . Smoking status: Current Every Day Smoker    Packs/day: 0.50  Years: 20.00    Pack years: 10.00    Types: Cigarettes  . Smokeless tobacco: Never Used  . Tobacco comment: 1/2 pack per day  Substance and Sexual Activity  . Alcohol use: Yes    Alcohol/week: 12.0 standard drinks    Types: 12 Cans of beer per week    Comment: couple of beers each day  . Drug use: No  . Sexual activity: Not on file  Lifestyle  . Physical activity    Days per week: Not on file    Minutes per session: Not on file  . Stress: Not on file  Relationships  . Social Musician on phone: Not on file    Gets together: Not on file    Attends religious service: Not on file    Active member of club or organization: Not on file    Attends meetings of clubs or organizations: Not on file    Relationship status: Not on file  . Intimate partner violence    Fear of current or ex partner: Not on file    Emotionally abused: Not on file    Physically abused: Not on file    Forced sexual activity: Not on file  Other Topics Concern  . Not on file  Social History Narrative  . Not on file    Family History  Problem Relation Age of Onset  . Prostate cancer Father   . Colon cancer Father   . Skin cancer Mother   . Cancer Brother   . Esophageal cancer Neg Hx   . Rectal cancer Neg Hx   . Stomach cancer Neg Hx     Current Facility-Administered Medications  Medication Dose Route Frequency Provider Last Rate Last Dose  . 0.9 %  sodium chloride infusion  250 mL Intravenous PRN Karie Soda, MD      . 0.9 %  sodium chloride infusion  250 mL Intravenous Continuous Bowser, Kaylyn Layer, NP      . acetaminophen (TYLENOL) solution 650 mg  650 mg Per Tube Q4H PRN Deveshwar, Simonne Maffucci, MD       Or  . acetaminophen  (TYLENOL) suppository 650 mg  650 mg Rectal Q4H PRN Deveshwar, Sanjeev, MD      . alum & mag hydroxide-simeth (MAALOX/MYLANTA) 200-200-20 MG/5ML suspension 30 mL  30 mL Per Tube Q6H PRN Karie Soda, MD      . apixaban Everlene Balls) tablet 5 mg  5 mg Oral BID Karie Soda, MD   5 mg at 03/14/19 0942  . atorvastatin (LIPITOR) tablet 40 mg  40 mg Oral QPM Karie Soda, MD      . chlorhexidine (PERIDEX) 0.12 % solution 15 mL  15 mL Mouth Rinse BID Karie Soda, MD   15 mL at 03/14/19 0943  . diphenhydrAMINE (BENADRYL) 12.5 MG/5ML elixir 12.5 mg  12.5 mg Per Tube Q6H PRN Karie Soda, MD       Or  . diphenhydrAMINE (BENADRYL) injection 12.5 mg  12.5 mg Intravenous Q6H PRN Karie Soda, MD      . hydrALAZINE (APRESOLINE) injection 10 mg  10 mg Intravenous Q2H PRN Karie Soda, MD      . HYDROmorphone (DILAUDID) injection 0.5-2 mg  0.5-2 mg Intravenous Q4H PRN Karie Soda, MD      . levothyroxine (SYNTHROID) tablet 125 mcg  125 mcg Oral [id number] Karie Soda, MD   125 mcg at 03/14/19 0804  . lip balm (CARMEX) ointment 1 application  1 application Topical BID Geralda Baumgardner,  Viviann Spare, MD   1 application at 03/14/19 [phone]  . magic mouthwash  15 mL Oral QID PRN Karie Soda, MD      . MEDLINE mouth rinse  15 mL Mouth Rinse Gretta Cool, MD   15 mL at 03/14/19 1205  . metoprolol tartrate (LOPRESSOR) injection 5 mg  5 mg Intravenous Q6H PRN Karie Soda, MD      . multivitamin with minerals tablet 1 tablet  1 tablet Oral Daily Karie Soda, MD   1 tablet at 03/14/19 [phone]  . ondansetron (ZOFRAN) tablet 4 mg  4 mg Oral Q6H PRN Karie Soda, MD       Or  . ondansetron [facility]) injection 4 mg  4 mg Intravenous Q6H PRN Karie Soda, MD      . prochlorperazine (COMPAZINE) tablet 10 mg  10 mg Oral Q6H PRN Karie Soda, MD       Or  . prochlorperazine (COMPAZINE) injection 5-10 mg  5-10 mg Intravenous Q6H PRN Karie Soda, MD      . sodium chloride flush (NS) 0.9 % injection 3 mL  3 mL Intravenous Catha Gosselin, MD   3 mL at 03/14/19 0944  . sodium chloride flush (NS) 0.9 % injection 3 mL  3 mL Intravenous PRN Karie Soda, MD   3 mL at 03/11/19 1028  . traMADol (ULTRAM) tablet 50-100 mg  50-100 mg Oral Q6H PRN Karie Soda, MD   50 mg at 03/07/19 2142     No Known Allergies  Signed: Lorenso Courier, MD, FACS, MASCRS Gastrointestinal and Minimally Invasive Surgery    [address] [place], [place] [address] [phone] Main / Paging [phone] Fax   03/14/2019, 5:19 PM

## 2019-03-14 NOTE — TOC Initial Note (Signed)
Transition of Care [facility]) - Initial/Assessment Note    Patient Details  Name: Adrian [name] MRN: [id number] Date of Birth: 16-Mar-1956  Transition [facility]) CM/SW Contact:    Ella Bodo, RN Phone Number: 03/14/2019, 5:15 PM  Clinical Narrative: Pt is a 63 y.o. M with significant PMH of CAD, tonsillar CA in remission, A-fib on anticoagulation which was held for recent surgery who was transferred emergently from [facility] where had been admitted for diverticulitis of large intestine with abscess requiring exploratory laparotomy, low anterior resection rigid proctoscopy, appendectomy. Presents with difficulty speaking. CTA head and neck showing left cervical carotid occlusion as well as left MCA occlusion. Attempted mechanical thrombectomy was unsuccessful and carotid artery also could not be revascularized.  [id number]:[id number] Left MCA infarct.   PTA, pt independent and living with spouse, who can provide care at discharge.  PT/OT ST recommending OP follow up, and referrals have been made to [facility].  Rehab information placed on AVS.                   Expected Discharge Plan: OP Rehab Barriers to Discharge: Continued Medical Work up        Expected Discharge Plan and Services Expected Discharge Plan: OP Rehab   Discharge Planning Services: CM Consult   Living arrangements for the past 2 months: Single Family Home Expected Discharge Date: 03/15/19                                    Prior Living Arrangements/Services Living arrangements for the past 2 months: Single Family Home Lives with:: Spouse Patient language and need for interpreter reviewed:: Yes Do you feel safe going back to the place where you live?: Yes      Need for Family Participation in Patient Care: Yes (Comment) Care giver support system in place?: Yes (comment)   Criminal Activity/Legal Involvement Pertinent to Current Situation/Hospitalization: No - Comment as needed  Activities of Daily  Living Home Assistive Devices/Equipment: Eyeglasses ADL Screening (condition at time of admission) Patient's cognitive ability adequate to safely complete daily activities?: Yes Is the patient deaf or have difficulty hearing?: No Does the patient have difficulty seeing, even when wearing glasses/contacts?: No Does the patient have difficulty concentrating, remembering, or making decisions?: No Patient able to express need for assistance with ADLs?: Yes Does the patient have difficulty dressing or bathing?: No Independently performs ADLs?: Yes (appropriate for developmental age) Does the patient have difficulty walking or climbing stairs?: No Weakness of Legs: None Weakness of Arms/Hands: None  Permission Sought/Granted                  Emotional Assessment Appearance:: Appears stated age Attitude/Demeanor/Rapport: Engaged Affect (typically observed): Accepting Orientation: : Oriented to Self, Oriented to Place, Oriented to  Time, Oriented to Situation Alcohol / Substance Use: Not Applicable Psych Involvement: No (comment)  Admission diagnosis:  DIVERTICULITIS Patient Active Problem List   Diagnosis Date Noted  . Hypokalemia 03/12/2019  . Expressive aphasia 03/10/2019  . Dysphagia 03/10/2019  . Middle cerebral artery embolism, left 03/09/2019  . Diverticular stricture ([place]) 03/07/2019  . Bacteria in urine   . Chronic atrial fibrillation 12/02/2018  . Current use of long term anticoagulation 12/02/2018  . UTI (urinary tract infection) 11/29/2018  . Hyponatremia 11/29/2018  . Diverticulitis of large intestine with abscess 09/23/2018  . Dyslipidemia 12/05/2017  . CAD (coronary artery disease),  native coronary artery 12/06/2015  . SOB (shortness of breath)   . NICM (nonischemic cardiomyopathy) ([place])   . Thyroid activity decreased   . Hypothyroidism 08/12/2013  . History of radiation therapy 05/30/2012  . Smokes tobacco daily 05/30/2012  . Cancer of tonsillar fossa ([place])  11/27/2011  . Tonsil cancer ([place]) 2005  . History of chemotherapy 2005   PCP:  [name], MD Pharmacy:   [id number] - [place], [place] [address] [place] [address] Phone: [phone] Fax: [phone]     Social Determinants of Health (SDOH) Interventions    Readmission Risk Interventions Readmission Risk Prevention Plan 03/14/2019  Transportation Screening Complete  Some recent data might be hidden   Reinaldo Raddle, RN, BSN  Trauma/Neuro ICU Case Manager [phone]

## 2019-03-15 LAB — CBC
HCT: 25.8 % — ABNORMAL LOW (ref 39.0–52.0)
Hemoglobin: 8.8 g/dL — ABNORMAL LOW (ref 13.0–17.0)
MCH: 31.1 pg (ref 26.0–34.0)
MCHC: 34.1 g/dL (ref 30.0–36.0)
MCV: 91.2 fL (ref 80.0–100.0)
Platelets: 169 10*3/uL (ref 150–400)
RBC: 2.83 MIL/uL — ABNORMAL LOW (ref 4.22–5.81)
RDW: 15.9 % — ABNORMAL HIGH (ref 11.5–15.5)
WBC: 3.5 10*3/uL — ABNORMAL LOW (ref 4.0–10.5)
nRBC: 0 % (ref 0.0–0.2)

## 2019-03-15 LAB — BASIC METABOLIC PANEL
Anion gap: 10 (ref 5–15)
BUN: 12 mg/dL (ref 8–23)
CO2: 25 mmol/L (ref 22–32)
Calcium: 8.4 mg/dL — ABNORMAL LOW (ref 8.9–10.3)
Chloride: 96 mmol/L — ABNORMAL LOW (ref 98–111)
Creatinine, Ser: 0.51 mg/dL — ABNORMAL LOW (ref 0.61–1.24)
GFR calc Af Amer: >60 mL/min (ref >60)
GFR calc non Af Amer: >60 mL/min (ref >60)
Glucose, Bld: 92 mg/dL (ref 70–99)
Potassium: 3.6 mmol/L (ref 3.5–5.1)
Sodium: 131 mmol/L — ABNORMAL LOW (ref 135–145)

## 2019-03-15 NOTE — Plan of Care (Signed)
Progressing towards goal

## 2019-03-15 NOTE — Progress Notes (Signed)
Occupational Therapy Treatment Patient Details Name: Adrian [name] MRN: [id number] DOB: 01/31/1956 Today's Date: 03/15/2019    History of present illness Pt is a 63 y.o. M with significant PMH of CAD, tonsillar CA in remission, A-fib on anticoagulation which was held for recent surgery who was transferred emergently from [facility] where had been admitted for diverticulitis of large intestine with abscess requiring exploratory laparotomy, low anterior resection rigid proctoscopy, appendectomy. Presents with difficulty speaking. CTA head and neck showing left cervical carotid occlusion as well as left MCA occlusion. Attempted mechanical thrombectomy was unsuccessful and carotid artery also could not be revascularized.  [id number] Left MCA infarct involving the middle/anterior divisionterritory. Pt with right groin swelling/hematoma post IR concerning for aneurysm. Thrombin injection done 8/5.   OT comments  Pt is now able to perform ADL independently.  He was able to follow 3 step commands with increased time, and was able to perform path finding to find locations in hospital using hospital signs without cues.  He was also able to find his way back to his room independently.  Functional cognition appears good, however, it is very difficult to fully assess higher level executive function due to significant language deficit - he is unable to text on his phone due to language deficit.   He reports his ex wife will be able to provide 24 hour supervision, and will monitor him with meds, finances, etc.  Recommend OPOT   Follow Up Recommendations  Outpatient OT;Supervision/Assistance - 24 hour(initially )    Equipment Recommendations  None recommended by OT    Recommendations for Other Services      Precautions / Restrictions Precautions Precautions: Fall Precaution Comments: SBP 120-140; NG tube       Mobility Bed Mobility Overal bed mobility: Independent                Transfers Overall  transfer level: Independent                    Balance Overall balance assessment: No apparent balance deficits (not formally assessed)                                         ADL either performed or assessed with clinical judgement   ADL Overall ADL's : Modified independent                                             Vision       Perception     Praxis      Cognition Arousal/Alertness: Awake/alert Behavior During Therapy: WFL for tasks assessed/performed Overall Cognitive Status: Difficult to assess                                 General Comments: Pt is able to follow a 3 step command.  He was able to utilize signage to way find to the east wing of the hospital, and was able to make his way back to his room mod I.  He did require increased time to read the signs and for problem solving.  He reports he is having difficulty texting.  Observed him, and deficits are very much language based errors.  Detailed discussion with  him re: current deficits.  That most of his deficits appear to be language based, but he could have some deficit with executive function, but determining that fully will depend on improvement of language.  His questions were answered.          Exercises     Shoulder Instructions       General Comments      Pertinent Vitals/ Pain       Pain Assessment: No/denies pain  Home Living                                          Prior Functioning/Environment              Frequency  Min 3X/week        Progress Toward Goals  OT Goals(current goals can now be found in the care plan section)  Progress towards OT goals: Progressing toward goals     Plan Discharge plan needs to be updated    Co-evaluation                 AM-PAC OT "6 Clicks" Daily Activity     Outcome Measure   Help from another person eating meals?: None Help from another person taking care of  personal grooming?: None Help from another person toileting, which includes using toliet, bedpan, or urinal?: None Help from another person bathing (including washing, rinsing, drying)?: None Help from another person to put on and taking off regular upper body clothing?: None Help from another person to put on and taking off regular lower body clothing?: None 6 Click Score: 24    End of Session    OT Visit Diagnosis: Unsteadiness on feet (R26.81);Cognitive communication deficit (R41.841) Symptoms and signs involving cognitive functions: Cerebral infarction   Activity Tolerance Patient tolerated treatment well   Patient Left in bed;with call bell/phone within reach;with bed alarm set   Nurse Communication Mobility status        Time: 9937-1696 OT Time Calculation (min): 43 min  Charges: OT General Charges $OT Visit: 1 Visit OT Treatments $Therapeutic Activity: 38-52 mins  Lucille Passy, OTR/L Acute Rehabilitation Services Pager [phone] Office [phone]    Lucille Passy M 03/15/2019, 3:11 PM

## 2019-03-15 NOTE — Progress Notes (Signed)
Adrian [name] [id number] 1955/09/07  CARE TEAM:  PCP: [name], MD  Outpatient Care Team: Patient Care Team: [name], MD as PCP - General (Family Medicine) Sueanne Margarita, MD as PCP - Cardiology (Cardiology) Sueanne Margarita, MD as Consulting Physician (Cardiology) Michael Boston, MD as Consulting Physician (General Surgery) Irene Shipper, MD as Consulting Physician (Gastroenterology) Izora Gala, MD as Consulting Physician (Otolaryngology) Garvin Fila, MD as Consulting Physician (Neurology)  Inpatient Treatment Team: Treatment Team: Attending Provider: Michael Boston, MD; Technician: Wylene Men, [place]; Consulting Physician: Michael Boston, MD; Consulting Physician: Nolon Nations, MD; Registered Nurse: Russella Dar, RN; Occupational Therapist: Walker Kehr, OT; Registered Nurse: Caroll Rancher, RN; Speech Language Pathologist: Sonia Baller, [place]; Social Worker: Pinion, Melvern Banker; Case Manager: Cecille Rubin, RN; Utilization Review: Alease Medina, RN   Problem List:   Principal Problem:   Middle cerebral artery embolism, left Active Problems:   History of radiation therapy   Smokes tobacco daily   Hypothyroidism   NICM (nonischemic cardiomyopathy) (HCC)   CAD (coronary artery disease), native coronary artery   Diverticulitis of large intestine with abscess   Chronic atrial fibrillation   Current use of long term anticoagulation   Tonsil cancer ([place])   History of chemotherapy   Diverticular stricture ([place])   Expressive aphasia   Dysphagia   Hypokalemia  03/07/2019  POST-OPERATIVE DIAGNOSIS:  SIGMOID DIVERTICULITIS WITH HISTORY OF ABSCESS  PROCEDURE:   XI ROBOTIC ASSISTED LOW ANTERIOR RESECTION RIGID PROCTOSCOPY ASSESSMENT OF TISSUE PERFUSION BY FIREFLY IMMUNOFLUORESCNECE ROBOTIC ASSISTED APPENDECTOMY  SURGEON:  [name], MD, FACS, [place]  03/09/2019  Interventional Radiology S/P bilateral common  carotid and RT vertbral arteriograms followed by attempted revascularization of occluded Lt ICA prox . MCA revascularization via collaterals TICI 2b to 2C .  Assessment  Stabilizing status post stroke cerebral artery with expressive aphasia & dysphagia.   [facility] Stay = 8 days)  Plan:  -Continue Dysphagia diet per speech therapy - Corpak remains in place as oral intake was 25% of needs yesterday. Eating much better today  -Bowel functioning well - OK to adv diet as tolerated to solids once speech Tx determines safest course.    -Continue rehab in the hopes that aphasia and dysphasia will continue to improve this hospitalization.    -Right groin swelling c/w pseudoanerysm  Thrombin injection done vascular surgery supervision.  Not worsening on full anticoagulation.  OK to mobilize according to vascular surgery  -pathology -consistent with diverticulitis.  Benign.  Discussed with patient. OK to d/c o/w from surgery standpoint  -full gtt anticoagulation.  Switch to Eliquis if Speech Tx okays swallowing pills  -Expressive aphasia persists but slightly improving. - agree w speech therapy, PT/OT, anticoagulation  -hypokalemia - replaced  -VTE prophylaxis- SCDs, etc  -mobilize as tolerated to help recovery  -Will see how diet goes today and calories - his ex wife is his main support system and will be available to assist in his care tomorrow  25 minutes spent in review, evaluation, examination, counseling, and coordination of care.  More than 50% of that time was spent in counseling.  I updated the patient's status to the patient and nurse.  Recommendations were made.  Questions were answered.  They expressed understanding & appreciation.   03/15/2019    Subjective: (Chief complaint)  Denies pain.  Mobilize little bit.  Mero modified barium swallow done Thursday afternoon.  Not perfect but markedly improved.  On dysphagia diet    Objective:  Vital signs:  Vitals:    03/14/19 2018 03/15/19 0414 03/15/19 0451 03/15/19 0719  BP: 122/63 (!) 114/59  (!) 102/57  Pulse: (!) 56   (!) 56  Resp:    14  Temp: 98.6 F (37 C) 98.4 F (36.9 C)  98.1 F (36.7 C)  TempSrc: Oral Oral    SpO2: 99% 99%  97%  Weight:   62 kg   Height:        Last BM Date: 03/13/19  Intake/Output   Yesterday:  08/07 0701 - 08/08 0700 In: 337.4 [I.V.:49.4; NG/GT:288] Out: 650 [Urine:650] This shift:  Total I/O In: 120 [P.O.:120] Out: -   Bowel function:  Flatus: YES  BM:  YES  Drain: (No drain)   Physical Exam:  General: Pt awake/alert/oriented x4 in no acute distress Eyes: PERRL, normal EOM.  Sclera clear.  No icterus  Neuro: CN II-XII intact w/o focal sensory/motor deficits.  Expressive aphasia mild at this point.  Definitely improved this week.    Lymph: No head/neck/groin lymphadenopathy Psych:  No delerium/psychosis/paranoia HENT: Normocephalic, Mucus membranes moist.  No thrush Neck: Supple, No tracheal deviation Chest: No chest wall pain w good excursion CV:  Pulses intact.  Regular rhythm MS: Normal AROM mjr joints.  No obvious deformity  Abdomen: Soft.  Nondistended.  Nontender.  Incisions w normal healing ridges. Nontender. No cellulitis or abscess.  No evidence of peritonitis.  No incarcerated hernias.  Right groin with mild bulging &  moderate old ecchymosis.  No oozing/bleeding Ext:   No deformity.  No mjr edema.  No cyanosis Skin: No petechiae / purpura  Results:   Cultures: Recent Results (from the past 720 hour(s))  SARS Coronavirus 2 (Performed in [facility] hospital lab)     Status: None   Collection Time: 03/04/19  9:47 AM   Specimen: Nasal Swab  Result Value Ref Range Status   SARS Coronavirus 2 NEGATIVE NEGATIVE Final    Comment: (NOTE) SARS-CoV-2 target nucleic acids are NOT DETECTED. The SARS-CoV-2 RNA is generally detectable in upper and lower respiratory specimens during the acute phase of infection. Negative results do not  preclude SARS-CoV-2 infection, do not rule out co-infections with other pathogens, and should not be used as the sole basis for treatment or other patient management decisions. Negative results must be combined with clinical observations, patient history, and epidemiological information. The expected result is Negative. Fact Sheet for Patients: SugarRoll.be Fact Sheet for Healthcare Providers: https://www.woods-mathews.com/ This test is not yet approved or cleared by the [place] FDA and  has been authorized for detection and/or diagnosis of SARS-CoV-2 by FDA under an Emergency Use Authorization (EUA). This EUA will remain  in effect (meaning this test can be used) for the duration of the COVID-19 declaration under Section 56 4(b)(1) of the Act, 21 U.S.C. section 360bbb-3(b)(1), unless the authorization is terminated or revoked sooner. Performed at [facility], [place] [address], [place], [place] [address]   MRSA PCR Screening     Status: None   Collection Time: 03/08/19 10:44 PM   Specimen: Nasal Mucosa; Nasopharyngeal  Result Value Ref Range Status   MRSA by PCR NEGATIVE NEGATIVE Final    Comment:        The GeneXpert MRSA Assay (FDA approved for NASAL specimens only), is one component of a comprehensive MRSA colonization surveillance program. It is not intended to diagnose MRSA infection nor to guide or monitor treatment for MRSA  infections. Performed at [facility], [place] [address], [place], [place] [address]     Labs: Results for orders placed or performed during the hospital encounter of 03/07/19 (from the past 48 hour(s))  Heparin level (unfractionated)     Status: None   Collection Time: 03/13/19  3:42 PM  Result Value Ref Range   Heparin Unfractionated 0.36 0.30 - 0.70 IU/mL    Comment: (NOTE) If heparin results are below expected values, and patient dosage has  been confirmed, suggest follow up testing of  antithrombin III levels. Performed at [facility], [place] [address], [place], [place] [address]   Creatinine, serum     Status: Abnormal   Collection Time: 03/14/19  6:02 AM  Result Value Ref Range   Creatinine, Ser 0.59 (L) 0.61 - 1.24 mg/dL   GFR calc non Af Amer >60 >60 mL/min   GFR calc Af Amer >60 >60 mL/min    Comment: Performed at [facility] [address] [place], [place] [address]  CBC     Status: Abnormal   Collection Time: 03/14/19  6:02 AM  Result Value Ref Range   WBC 3.3 (L) 4.0 - 10.5 K/uL   RBC 2.92 (L) 4.22 - 5.81 MIL/uL   Hemoglobin 9.0 (L) 13.0 - 17.0 g/dL   HCT 26.3 (L) 39.0 - 52.0 %   MCV 90.1 80.0 - 100.0 fL   MCH 30.8 26.0 - 34.0 pg   MCHC 34.2 30.0 - 36.0 g/dL   RDW 15.6 (H) 11.5 - 15.5 %   Platelets 146 (L) 150 - 400 K/uL   nRBC 0.0 0.0 - 0.2 %    Comment: Performed at [facility] [address] [place], [place] [address]  Heparin level (unfractionated)     Status: None   Collection Time: 03/14/19  6:02 AM  Result Value Ref Range   Heparin Unfractionated 0.38 0.30 - 0.70 IU/mL    Comment: (NOTE) If heparin results are below expected values, and patient dosage has  been confirmed, suggest follow up testing of antithrombin III levels. Performed at [facility], [place] [address] [place], [place] [address]   Potassium     Status: None   Collection Time: 03/14/19  6:02 AM  Result Value Ref Range   Potassium 3.9 3.5 - 5.1 mmol/L    Comment: Performed at [facility] [address] [place], [place] [address]  Magnesium     Status: None   Collection Time: 03/14/19  6:02 AM  Result Value Ref Range   Magnesium 2.0 1.7 - 2.4 mg/dL    Comment: Performed at [facility] [address], [place], [place] [address]  Basic metabolic panel     Status: Abnormal   Collection Time: 03/15/19  3:18 AM  Result Value Ref Range   Sodium 131 (L) 135 - 145 mmol/L   Potassium 3.6 3.5 - 5.1 mmol/L   Chloride 96 (L) 98 - 111  mmol/L   CO2 25 22 - 32 mmol/L   Glucose, Bld 92 70 - 99 mg/dL   BUN 12 8 - 23 mg/dL   Creatinine, Ser 0.51 (L) 0.61 - 1.24 mg/dL   Calcium 8.4 (L) 8.9 - 10.3 mg/dL   GFR calc non Af Amer >60 >60 mL/min   GFR calc Af Amer >60 >60 mL/min   Anion gap 10 5 - 15    Comment: Performed at [facility], [place]  [address] [place], [place] [address]  CBC     Status: Abnormal   Collection Time: 03/15/19  3:18 AM  Result Value Ref Range   WBC 3.5 (L) 4.0 - 10.5 K/uL   RBC 2.83 (L) 4.22 - 5.81 MIL/uL   Hemoglobin 8.8 (L) 13.0 - 17.0 g/dL   HCT 25.8 (L) 39.0 - 52.0 %   MCV 91.2 80.0 - 100.0 fL   MCH 31.1 26.0 - 34.0 pg   MCHC 34.1 30.0 - 36.0 g/dL   RDW 15.9 (H) 11.5 - 15.5 %   Platelets 169 150 - 400 K/uL   nRBC 0.0 0.0 - 0.2 %    Comment: Performed at [facility], [place] [address] [place], [place] [address]    Imaging / Studies: Dg Swallowing Func-speech Pathology  Result Date: 03/13/2019 Objective Swallowing Evaluation: Type of Study: MBS-Modified Barium Swallow Study  Patient Details Name: Adrian [name] MRN: [id number] Date of Birth: [birth year] Today's Date: 03/13/2019 Time: SLP Start Time (ACUTE ONLY): 1409 -SLP Stop Time (ACUTE ONLY): 1435 SLP Time Calculation (min) (ACUTE ONLY): 26 min Past Medical History: Past Medical History: Diagnosis Date . AF (paroxysmal atrial fibrillation) ([place]) 11/29/2018 . Atrial flutter ([place]) 10/12/2015 . CAD (coronary artery disease), native coronary artery 12/06/2015  a. 10/2015 MV: EF  37%, reversible defect inferior apex, intermediate risk findings; b. 10/2015 Cath: 20% mid RCA. . Colonic diverticular abscess  . Diverticulitis  . History of chemotherapy 2005  Cisplatin . Hypothyroidism  . NICM (nonischemic cardiomyopathy) ([place]) 10/14/2015  a. Tachy mediated?;  b. Echo 3/17 - Mild concentric LVH, EF 30-35%, anteroseptal, anterior, anterolateral, apical anterior, lateral hypokinesis, trivial MR, mild to moderately reduced RVSF; c. LHC 3/17 - mRCA 20% . Paroxysmal  atrial flutter ([place])   a. TEE 3/17 with ? LAA clot-->s/p TEE/DCCV 11/24/2015 . Radiation NOv.3,2005-Dec. 15, 2005  6810 cGy in 30 fractions . Tonsil cancer [facility]) 2005  Dr Romeo Rabon.  XRT Past Surgical History: Past Surgical History: Procedure Laterality Date . APPENDECTOMY N/A 03/07/2019  Procedure: ROBOTIC ASSISTED APPENDECTOMY;  Surgeon: Michael Boston, MD;  Location: [facility];  Service: General;  Laterality: N/A; . CARDIAC CATHETERIZATION N/A 10/18/2015  Procedure: Left Heart Cath and Coronary Angiography;  Surgeon: Burnell Blanks, MD;  Location: [facility];  Service: Cardiovascular;  Laterality: N/A; . CARDIOVERSION N/A 11/24/2015  Procedure: CARDIOVERSION;  Surgeon: Josue Hector, MD;  Location: [facility];  Service: Cardiovascular;  Laterality: N/A; . CYSTOSCOPY WITH STENT PLACEMENT Bilateral 03/07/2019  Procedure: CYSTOSCOPY WITH BILATERAL FIREFLY INJECTION;  Surgeon: Ardis Hughs, MD;  Location: [facility];  Service: Urology;  Laterality: Bilateral; . GASTROSTOMY TUBE PLACEMENT  07/04/2004  IR - G tube for tonsilar cancer . IR ANGIO INTRA EXTRACRAN SEL COM CAROTID INNOMINATE UNI R MOD SED  03/09/2019 . IR ANGIO VERTEBRAL SEL VERTEBRAL UNI R MOD SED  03/09/2019 . IR CT HEAD LTD  03/09/2019 . IR PERCUTANEOUS ART THROMBECTOMY/INFUSION INTRACRANIAL INC DIAG ANGIO  03/09/2019 . IR RADIOLOGIST EVAL & MGMT  12/18/2018 . LAMINECTOMY    C5/placement of steel plate . NECK SURGERY  2003  replaced disk . RADIOLOGY WITH ANESTHESIA N/A 03/08/2019  Procedure: IR WITH ANESTHESIA;  Surgeon: Luanne Bras, MD;  Location: [facility];  Service: Radiology;  Laterality: N/A; . TEE WITHOUT CARDIOVERSION N/A 10/15/2015  Procedure: TRANSESOPHAGEAL ECHOCARDIOGRAM (TEE);  Surgeon: Larey Dresser, MD;  Location: [facility];  Service: Cardiovascular;  Laterality: N/A; . TEE WITHOUT CARDIOVERSION N/A 11/24/2015  Procedure: TRANSESOPHAGEAL ECHOCARDIOGRAM (TEE);  Surgeon: Josue Hector, MD;  Location: [facility];  Service:  Cardiovascular;  Laterality: N/A; . XI ROBOTIC ASSISTED COLOSTOMY TAKEDOWN N/A 03/07/2019  Procedure: XI ROBOTIC ASSISTED LOW ANTERIOR RESECTION, RIGID PROCTOSCOPY;  Surgeon: Michael Boston, MD;  [facility];  Service: General;  Laterality: N/A; HPI: ZAYLEN SUSMAN is a 63 y.o. male coronary artery disease, tonsillar ca in remission, atrial fibrillation on anticoagulation which was held for surgery since last Tuesday, transferred emergently from [facility] hospital where he was admitted for diverticulitis of the large intestine with abscess requiring exploratory laparotomy, low anterior resection rigid proctoscopy, appendectomy, last seen normal at 7:30 PM on 03/08/2019 at [facility] when it was noticed that he had difficulty with speech- speech did not make sense. CTA head and neck was also done which revealed a left cervical carotid occlusion as well as left MCA occlusion. S/P bilateral common carotid and RT vertbral arteriograms followed by attempted revascularization of occluded Lt ICA prox .MCA revascularization via collaterals TICI 2b to 2C . MRI shows Restricted diffusion in the left MCA middle to anterior division territory involving most of the insula, left operculum. Repeat MBS today for possible po initiation.  Subjective: pt alert, aphasic Assessment / Plan / Recommendation CHL IP CLINICAL [name] 03/13/2019 Clinical Impression Pt's swallow function has improved since initial MBS. Cohesion, control and oral transit of boluses was more coordinated, improved timeliness with effective lingual ROM against hard palate to propel boluses. Laryngeal closure and inversion of epiglottis continues to be reduced, however less than prior MBS. Laryngeal intrusion was reduced to penetration versus aspiration although reaching his vocal cords with honey thick. Nectar thick was penetrated into anterior portion of vestibule and from spill over from the pyriform sinuses. He  senses penetrates more than half the  time but when cued to cough was able to clear inconsistently. Multiple postures/strategies performed including chin tuck, head turn and breath hold with which were less safe with thin liquids. Vallecular and pyriform sinus residue was moderate throughout and pt performed 3-4 subsequent swallows without cues. Residue was not signiricantly increased with nectar thick A combination of head turn to the right, multiple swallows and inconsistent cough/throat clear and teaspoon administration only with nectar is recommended and puree solids (D1). He will likely continue to experience some degree of penetration with these recommendations however liquid modificantion, posture, bolus control hopefully will decrease his risk. He is also ambulatory with assist/supervision which is advantageous for pulmonary function.  SLP Visit Diagnosis Dysphagia, pharyngeal phase (R13.13) Attention and concentration deficit following -- Frontal lobe and executive function deficit following -- Impact on safety and function Moderate aspiration risk;Severe aspiration risk   CHL IP TREATMENT RECOMMENDATION 03/13/2019 Treatment Recommendations Therapy as outlined in treatment plan below   Prognosis 03/13/2019 Prognosis for Safe Diet Advancement Good Barriers to Reach Goals Other (Comment) Barriers/Prognosis Comment -- CHL IP DIET RECOMMENDATION 03/13/2019 SLP Diet Recommendations Dysphagia 1 (Puree) solids;Nectar thick liquid Liquid Administration via Spoon Medication Administration Crushed with puree Compensations Slow rate;Small sips/bites;Multiple dry swallows after each bite/sip;Clear throat intermittently;Hard cough after swallow;Other (Comment) Postural Changes Seated upright at 90 degrees;Remain semi-upright after after feeds/meals (Comment)   CHL IP OTHER RECOMMENDATIONS 03/13/2019 Recommended Consults -- Oral Care Recommendations Oral care BID Other Recommendations Have oral suction available   CHL IP FOLLOW UP RECOMMENDATIONS 03/13/2019 Follow up  Recommendations Inpatient Rehab   CHL IP FREQUENCY AND DURATION 03/13/2019 Speech Therapy Frequency (ACUTE ONLY) min 2x/week Treatment Duration 2 weeks  CHL IP ORAL PHASE 03/13/2019 Oral Phase WFL Oral - Pudding Teaspoon -- Oral - Pudding Cup -- Oral - Honey Teaspoon -- Oral - Honey Cup WFL Oral - Nectar Teaspoon NT Oral - Nectar Cup WFL Oral - Nectar Straw -- Oral - Thin Teaspoon NT Oral - Thin Cup WFL Oral - Thin Straw -- Oral - Puree NT Oral - Mech Soft -- Oral - Regular -- Oral - Multi-Consistency -- Oral - Pill -- Oral Phase - Comment --  CHL IP PHARYNGEAL PHASE 03/13/2019 Pharyngeal Phase Impaired Pharyngeal- Pudding Teaspoon -- Pharyngeal -- Pharyngeal- Pudding Cup -- Pharyngeal -- Pharyngeal- Honey Teaspoon -- Pharyngeal -- Pharyngeal- Honey Cup Penetration/Aspiration during swallow;Penetration/Apiration after swallow;Pharyngeal residue - valleculae;Pharyngeal residue - pyriform;Reduced epiglottic inversion;Reduced airway/laryngeal closure Pharyngeal Material enters airway, CONTACTS cords and then ejected out Pharyngeal- Nectar Teaspoon NT Pharyngeal -- Pharyngeal- Nectar Cup Pharyngeal residue - pyriform;Pharyngeal residue - valleculae;Penetration/Aspiration during swallow;Penetration/Apiration after swallow;Reduced airway/laryngeal closure;Reduced epiglottic inversion Pharyngeal Material enters airway, CONTACTS cords and then ejected out;Material enters airway, remains ABOVE vocal cords and not ejected out Pharyngeal- Nectar Straw -- Pharyngeal -- Pharyngeal- Thin Teaspoon NT Pharyngeal -- Pharyngeal- Thin Cup Penetration/Aspiration during swallow;Reduced airway/laryngeal closure;Reduced pharyngeal peristalsis;Pharyngeal residue - valleculae;Pharyngeal residue - pyriform Pharyngeal -- Pharyngeal- Thin Straw -- Pharyngeal -- Pharyngeal- Puree NT Pharyngeal -- Pharyngeal- Mechanical Soft -- Pharyngeal -- Pharyngeal- Regular -- Pharyngeal -- Pharyngeal- Multi-consistency -- Pharyngeal -- Pharyngeal- Pill --  Pharyngeal -- Pharyngeal Comment --  CHL IP CERVICAL ESOPHAGEAL PHASE 03/13/2019 Cervical Esophageal Phase WFL Pudding Teaspoon -- Pudding Cup -- Honey Teaspoon -- Honey Cup WFL Nectar Teaspoon NT Nectar Cup WFL Nectar Straw -- Thin Teaspoon NT Thin Cup WFL Thin Straw -- Puree NT Mechanical Soft -- Regular -- Multi-consistency -- Pill -- Cervical Esophageal Comment -- Houston Siren 03/13/2019, 8:38 PM Orbie Pyo Litaker M.Ed Risk analyst [phone] Office [phone]               Medications / Allergies: per chart  Antibiotics: Anti-infectives (From admission, onward)   Start     Dose/Rate Route Frequency Ordered Stop   03/09/19 0035  ceFAZolin (ANCEF) 2-4 GM/100ML-% IVPB    Note to Pharmacy: Luis Abed   : cabinet override      03/09/19 0035 03/09/19 1244   03/07/19 2200  cefoTEtan (CEFOTAN) 2 g in sodium chloride 0.9 % 100 mL IVPB     2 g 200 mL/hr over 30 Minutes Intravenous Every 12 hours 03/07/19 1700 03/07/19 2210   03/07/19 1434  clindamycin (CLEOCIN) 900 mg, gentamicin (GARAMYCIN) 240 mg in sodium chloride 0.9 % 1,000 mL for intraperitoneal lavage  Status:  Discontinued       As needed 03/07/19 1434 03/07/19 1456   03/07/19 1400  neomycin (MYCIFRADIN) tablet 1,000 mg  Status:  Discontinued     1,000 mg Oral 3 times per day 03/07/19 1026 03/07/19 1641   03/07/19 1400  metroNIDAZOLE (FLAGYL) tablet 1,000 mg  Status:  Discontinued     1,000 mg Oral 3 times per day 03/07/19 1026 03/07/19 1641   03/07/19 1030  cefoTEtan (CEFOTAN) 2 g in sodium chloride 0.9 % 100 mL IVPB     2 g 200 mL/hr over 30 Minutes Intravenous On call to O.R. 03/07/19 1026 03/07/19 1228   03/07/19 0600  clindamycin (CLEOCIN) 900 mg, gentamicin (GARAMYCIN) 240 mg in sodium chloride 0.9 % 1,000 mL for intraperitoneal lavage  Status:  Discontinued      Irrigation To Surgery 03/06/19 1018 03/07/19 1641  Note: Portions of this report may have been transcribed using voice  recognition software. Every effort was made to ensure accuracy; however, inadvertent computerized transcription errors may be present.   Any transcriptional errors that result from this process are unintentional.     [name], MD, FACS, MASCRS Gastrointestinal and Minimally Invasive Surgery    [address] [place], [place] [address] [phone] Main / Paging [phone] Fax

## 2019-03-16 MED ORDER — FOOD THICKENER (THICKENUP CLEAR): 1.0000 | ORAL | 3 refills | Status: DC | PRN

## 2019-03-16 NOTE — Discharge Instructions (Signed)
SURGERY: POST OP INSTRUCTIONS (Surgery for small bowel obstruction, colon resection, etc)   ######################################################################  EAT Gradually transition to a high fiber diet with a fiber supplement over the next few days after discharge  WALK Walk an hour a day.  Control your pain to do that.    CONTROL PAIN Control pain so that you can walk, sleep, tolerate sneezing/coughing, go up/down stairs.  HAVE A BOWEL MOVEMENT DAILY Keep your bowels regular to avoid problems.  OK to try a laxative to override constipation.  OK to use an antidairrheal to slow down diarrhea.  Call if not better after 2 tries  CALL IF YOU HAVE PROBLEMS/CONCERNS Call if you are still struggling despite following these instructions. Call if you have concerns not answered by these instructions  ######################################################################   DIET Follow a light diet the first few days at home.  Start with a bland diet such as soups, liquids, starchy foods, low fat foods, etc.  If you feel full, bloated, or constipated, stay on a ful liquid or pureed/blenderized diet for a few days until you feel better and no longer constipated. Be sure to drink plenty of fluids every day to avoid getting dehydrated (feeling dizzy, not urinating, etc.). Gradually add a fiber supplement to your diet over the next week.  Gradually get back to a regular solid diet.  Avoid fast food or heavy meals the first week as you are more likely to get nauseated. It is expected for your digestive tract to need a few months to get back to normal.  It is common for your bowel movements and stools to be irregular.  You will have occasional bloating and cramping that should eventually fade away.  Until you are eating solid food normally, off all pain medications, and back to regular activities; your bowels will not be normal. Focus on eating a low-fat, high fiber diet the rest of your life  (See Getting to [place], below).  CARE of your INCISION or WOUND It is good for closed incision and even open wounds to be washed every day.  Shower every day.  Short baths are fine.  Wash the incisions and wounds clean with soap & water.    If you have a closed incision(s), wash the incision with soap & water every day.  You may leave closed incisions open to air if it is dry.   You may cover the incision with clean gauze & replace it after your daily shower for comfort. If you have skin tapes (Steristrips) or skin glue (Dermabond) on your incision, leave them in place.  They will fall off on their own like a scab.  You may trim any edges that curl up with clean scissors.  If you have staples, set up an appointment for them to be removed in the office in 10 days after surgery.  If you have a drain, wash around the skin exit site with soap & water and place a new dressing of gauze or band aid around the skin every day.  Keep the drain site clean & dry.    If you have an open wound with packing, see wound care instructions.  In general, it is encouraged that you remove your dressing and packing, shower with soap & water, and replace your dressing once a day.  Pack the wound with clean gauze moistened with normal (0.9%) saline to keep the wound moist & uninfected.  Pressure on the dressing for 30 minutes will stop most  wound bleeding.  Eventually your body will heal & pull the open wound closed over the next few months.  Raw open wounds will occasionally bleed or secrete yellow drainage until it heals closed.  Drain sites will drain a little until the drain is removed.  Even closed incisions can have mild bleeding or drainage the first few days until the skin edges scab over & seal.   If you have an open wound with a wound vac, see wound vac care instructions.     ACTIVITIES as tolerated Start light daily activities --- self-care, walking, climbing stairs-- beginning the day after surgery.   Gradually increase activities as tolerated.  Control your pain to be active.  Stop when you are tired.  Ideally, walk several times a day, eventually an hour a day.   Most people are back to most day-to-day activities in a few weeks.  It takes 4-8 weeks to get back to unrestricted, intense activity. If you can walk 30 minutes without difficulty, it is safe to try more intense activity such as jogging, treadmill, bicycling, low-impact aerobics, swimming, etc. Save the most intensive and strenuous activity for last (Usually 4-8 weeks after surgery) such as sit-ups, heavy lifting, contact sports, etc.  Refrain from any intense heavy lifting or straining until you are off narcotics for pain control.  You will have off days, but things should improve week-by-week. DO NOT PUSH THROUGH PAIN.  Let pain be your guide: If it hurts to do something, don't do it.  Pain is your body warning you to avoid that activity for another week until the pain goes down. You may drive when you are no longer taking narcotic prescription pain medication, you can comfortably wear a seatbelt, and you can safely make sudden turns/stops to protect yourself without hesitating due to pain. You may have sexual intercourse when it is comfortable. If it hurts to do something, stop.  MEDICATIONS Take your usually prescribed home medications unless otherwise directed.   Blood thinners:  Usually you can restart any strong blood thinners after the second postoperative day.  It is OK to take aspirin right away.     If you are on strong blood thinners (warfarin/Coumadin, Plavix, Xerelto, Eliquis, Pradaxa, etc), discuss with your surgeon, medicine PCP, and/or cardiologist for instructions on when to restart the blood thinner & if blood monitoring is needed (PT/INR blood check, etc).     PAIN CONTROL Pain after surgery or related to activity is often due to strain/injury to muscle, tendon, nerves and/or incisions.  This pain is usually  short-term and will improve in a few months.  To help speed the process of healing and to get back to regular activity more quickly, DO THE FOLLOWING THINGS TOGETHER: 1. Increase activity gradually.  DO NOT PUSH THROUGH PAIN 2. Use Ice and/or Heat 3. Try Gentle Massage and/or Stretching 4. Take over the counter pain medication 5. Take Narcotic prescription pain medication for more severe pain  Good pain control = faster recovery.  It is better to take more medicine to be more active than to stay in bed all day to avoid medications. 1.  Increase activity gradually Avoid heavy lifting at first, then increase to lifting as tolerated over the next 6 weeks. Do not push through the pain.  Listen to your body and avoid positions and maneuvers than reproduce the pain.  Wait a few days before trying something more intense Walking an hour a day is encouraged to help your body recover  faster and more safely.  Start slowly and stop when getting sore.  If you can walk 30 minutes without stopping or pain, you can try more intense activity (running, jogging, aerobics, cycling, swimming, treadmill, sex, sports, weightlifting, etc.) Remember: If it hurts to do it, then dont do it! 2. Use Ice and/or Heat You will have swelling and bruising around the incisions.  This will take several weeks to resolve. Ice packs or heating pads (6-8 times a day, 30-60 minutes at a time) will help sooth soreness & bruising. Some people prefer to use ice alone, heat alone, or alternate between ice & heat.  Experiment and see what works best for you.  Consider trying ice for the first few days to help decrease swelling and bruising; then, switch to heat to help relax sore spots and speed recovery. Shower every day.  Short baths are fine.  It feels good!  Keep the incisions and wounds clean with soap & water.   3. Try Gentle Massage and/or Stretching Massage at the area of pain many times a day Stop if you feel pain - do not  overdo it 4. Take over the counter pain medication This helps the muscle and nerve tissues become less irritable and calm down faster Choose ONE of the following over-the-counter anti-inflammatory medications: Acetaminophen 500mg  tabs (Tylenol) 1-2 pills with every meal and just before bedtime (avoid if you have liver problems or if you have acetaminophen in you narcotic prescription) Naproxen 220mg  tabs (ex. Aleve, Naprosyn) 1-2 pills twice a day (avoid if you have kidney, stomach, IBD, or bleeding problems) Ibuprofen 200mg  tabs (ex. Advil, Motrin) 3-4 pills with every meal and just before bedtime (avoid if you have kidney, stomach, IBD, or bleeding problems) Take with food/snack several times a day as directed for at least 2 weeks to help keep pain / soreness down & more manageable. 5. Take Narcotic prescription pain medication for more severe pain A prescription for strong pain control is often given to you upon discharge (for example: oxycodone/Percocet, hydrocodone/Norco/Vicodin, or tramadol/Ultram) Take your pain medication as prescribed. Be mindful that most narcotic prescriptions contain Tylenol (acetaminophen) as well - avoid taking too much Tylenol. If you are having problems/concerns with the prescription medicine (does not control pain, nausea, vomiting, rash, itching, etc.), please call us [phone] to see if we need to switch you to a different pain medicine that will work better for you and/or control your side effects better. If you need a refill on your pain medication, you must call the office before 4 pm and on weekdays only.  By federal law, prescriptions for narcotics cannot be called into a pharmacy.  They must be filled out on paper & picked up from our office by the patient or authorized caretaker.  Prescriptions cannot be filled after 4 pm nor on weekends.    WHEN TO CALL us [phone] Severe uncontrolled or worsening pain  Fever over 101 F (38.5 C) Concerns with  the incision: Worsening pain, redness, rash/hives, swelling, bleeding, or drainage Reactions / problems with new medications (itching, rash, hives, nausea, etc.) Nausea and/or vomiting Difficulty urinating Difficulty breathing Worsening fatigue, dizziness, lightheadedness, blurred vision Other concerns If you are not getting better after two weeks or are noticing you are getting worse, contact our office [phone] for further advice.  We may need to adjust your medications, re-evaluate you in the office, send you to the emergency room, or see what other things we can do to help.  The clinic staff is available to answer your questions during regular business hours (8:30am-5pm).  Please dont hesitate to call and ask to speak to one of our nurses for clinical concerns.    A surgeon from [facility] is always on call at the hospitals 24 hours/day If you have a medical emergency, go to the nearest emergency room or call 911.  FOLLOW UP in our office One the day of your discharge from the hospital (or the next business weekday), please call [facility] to set up or confirm an appointment to see your surgeon in the office for a follow-up appointment.  Usually it is 2-3 weeks after your surgery.   If you have skin staples at your incision(s), let the office know so we can set up a time in the office for the nurse to remove them (usually around 10 days after surgery). Make sure that you call for appointments the day of discharge (or the next business weekday) from the hospital to ensure a convenient appointment time. IF YOU HAVE DISABILITY OR FAMILY LEAVE FORMS, BRING THEM TO THE OFFICE FOR PROCESSING.  DO NOT GIVE THEM TO YOUR DOCTOR.  [facility] [address], [place], [place], [place]  [address] ? [phone] - Main [phone] - [place],  [phone] - Fax [email or web address]  GETTING TO GOOD BOWEL HEALTH. It is  expected for your digestive tract to need a few months to get back to normal.  It is common for your bowel movements and stools to be irregular.  You will have occasional bloating and cramping that should eventually fade away.  Until you are eating solid food normally, off all pain medications, and back to regular activities; your bowels will not be normal.   Avoiding constipation The goal: ONE SOFT BOWEL MOVEMENT A DAY!    Drink plenty of fluids.  Choose water first. TAKE A FIBER SUPPLEMENT EVERY DAY THE REST OF YOUR LIFE During your first week back home, gradually add back a fiber supplement every day Experiment which form you can tolerate.   There are many forms such as powders, tablets, wafers, gummies, etc Psyllium bran (Metamucil), methylcellulose (Citrucel), Miralax or Glycolax, Benefiber, Flax Seed.  Adjust the dose week-by-week (1/2 dose/day to 6 doses a day) until you are moving your bowels 1-2 times a day.  Cut back the dose or try a different fiber product if it is giving you problems such as diarrhea or bloating. Sometimes a laxative is needed to help jump-start bowels if constipated until the fiber supplement can help regulate your bowels.  If you are tolerating eating & you are farting, it is okay to try a gentle laxative such as double dose MiraLax, prune juice, or Milk of Magnesia.  Avoid using laxatives too often. Stool softeners can sometimes help counteract the constipating effects of narcotic pain medicines.  It can also cause diarrhea, so avoid using for too long. If you are still constipated despite taking fiber daily, eating solids, and a few doses of laxatives, call our office. Controlling diarrhea Try drinking liquids and eating bland foods for a few days to avoid stressing your intestines further. Avoid dairy products (especially milk & ice cream) for a short time.  The intestines often can lose the ability to digest lactose when stressed. Avoid foods that cause gassiness or  bloating.  Typical foods include beans and other legumes, cabbage, broccoli, and dairy foods.  Avoid greasy, spicy, fast foods.  Every person  has some sensitivity to other foods, so listen to your body and avoid those foods that trigger problems for you. Probiotics (such as active yogurt, Align, etc) may help repopulate the intestines and colon with normal bacteria and calm down a sensitive digestive tract Adding a fiber supplement gradually can help thicken stools by absorbing excess fluid and retrain the intestines to act more normally.  Slowly increase the dose over a few weeks.  Too much fiber too soon can backfire and cause cramping & bloating. It is okay to try and slow down diarrhea with a few doses of antidiarrheal medicines.   Bismuth subsalicylate (ex. Kayopectate, Pepto Bismol) for a few doses can help control diarrhea.  Avoid if pregnant.   Loperamide (Imodium) can slow down diarrhea.  Start with one tablet (2mg ) first.  Avoid if you are having fevers or severe pain.  ILEOSTOMY PATIENTS WILL HAVE CHRONIC DIARRHEA since their colon is not in use.    Drink plenty of liquids.  You will need to drink even more glasses of water/liquid a day to avoid getting dehydrated. Record output from your ileostomy.  Expect to empty the bag every 3-4 hours at first.  Most people with a permanent ileostomy empty their bag 4-6 times at the least.   Use antidiarrheal medicine (especially Imodium) several times a day to avoid getting dehydrated.  Start with a dose at bedtime & breakfast.  Adjust up or down as needed.  Increase antidiarrheal medications as directed to avoid emptying the bag more than 8 times a day (every 3 hours). Work with your wound ostomy nurse to learn care for your ostomy.  See ostomy care instructions. TROUBLESHOOTING IRREGULAR BOWELS 1) Start with a soft & bland diet. No spicy, greasy, or fried foods.  2) Avoid gluten/wheat or dairy products from diet to see if symptoms improve. 3) Miralax  17gm or flax seed mixed in [place]. water or juice-daily. May use 2-4 times a day as needed. 4) Gas-X, Phazyme, etc. as needed for gas & bloating.  5) Prilosec (omeprazole) over-the-counter as needed 6)  Consider probiotics (Align, Activa, etc) to help calm the bowels down  Call your doctor if you are getting worse or not getting better.  Sometimes further testing (cultures, endoscopy, X-ray studies, CT scans, bloodwork, etc.) may be needed to help diagnose and treat the cause of the diarrhea. [facility], [place], [place], [place], [place]  [address] [phone] - Main.    [phone]  - Toll Free.   [phone] - Fax [email or web address]    Diverticulosis  Diverticulosis is a condition that develops when small pouches (diverticula) form in the wall of the large intestine (colon). The colon is where water is absorbed and stool is formed. The pouches form when the inside layer of the colon pushes through weak spots in the outer layers of the colon. You may have a few pouches or many of them. What are the causes? The cause of this condition is not known. What increases the risk? The following factors may make you more likely to develop this condition:  Being older than age 50. Your risk for this condition increases with age. Diverticulosis is rare among people younger than age 70. By age 34, many people have it.  Eating a low-fiber diet.  Having frequent constipation.  Being overweight.  Not getting enough exercise.  Smoking.  Taking over-the-counter pain medicines, like aspirin and ibuprofen.  Having a family history of diverticulosis. What are the  signs or symptoms? In most people, there are no symptoms of this condition. If you do have symptoms, they may include:  Bloating.  Cramps in the abdomen.  Constipation or diarrhea.  Pain in the lower left side of the abdomen. How is this diagnosed? This condition is most often  diagnosed during an exam for other colon problems. Because diverticulosis usually has no symptoms, it often cannot be diagnosed independently. This condition may be diagnosed by:  Using a flexible scope to examine the colon (colonoscopy).  Taking an X-ray of the colon after dye has been put into the colon (barium enema).  Doing a CT scan. How is this treated? You may not need treatment for this condition if you have never developed an infection related to diverticulosis. If you have had an infection before, treatment may include:  Eating a high-fiber diet. This may include eating more fruits, vegetables, and grains.  Taking a fiber supplement.  Taking a live bacteria supplement (probiotic).  Taking medicine to relax your colon.  Taking antibiotic medicines. Follow these instructions at home:  Drink 6-8 glasses of water or more each day to prevent constipation.  Try not to strain when you have a bowel movement.  If you have had an infection before: ? Eat more fiber as directed by your health care provider or your diet and nutrition specialist (dietitian). ? Take a fiber supplement or probiotic, if your health care provider approves.  Take over-the-counter and prescription medicines only as told by your health care provider.  If you were prescribed an antibiotic, take it as told by your health care provider. Do not stop taking the antibiotic even if you start to feel better.  Keep all follow-up visits as told by your health care provider. This is important. Contact a health care provider if:  You have pain in your abdomen.  You have bloating.  You have cramps.  You have not had a bowel movement in 3 days. Get help right away if:  Your pain gets worse.  Your bloating becomes very bad.  You have a fever or chills, and your symptoms suddenly get worse.  You vomit.  You have bowel movements that are bloody or black.  You have bleeding from your  rectum. Summary  Diverticulosis is a condition that develops when small pouches (diverticula) form in the wall of the large intestine (colon).  You may have a few pouches or many of them.  This condition is most often diagnosed during an exam for other colon problems.  If you have had an infection related to diverticulosis, treatment may include increasing the fiber in your diet, taking supplements, or taking medicines. This information is not intended to replace advice given to you by your health care provider. Make sure you discuss any questions you have with your health care provider. Document Released: 04/20/2004 Document Revised: 07/06/2017 Document Reviewed: 06/12/2016 Elsevier Patient Education  2020 [place] on my medicine - ELIQUIS (apixaban)  Why was Eliquis prescribed for you? Eliquis was prescribed for you to reduce the risk of forming blood clots that can cause a stroke if you have a medical condition called atrial fibrillation (a type of irregular heartbeat) OR to reduce the risk of a blood clots forming after orthopedic surgery.  What do You need to know about Eliquis ? Take your Eliquis TWICE DAILY - one tablet in the morning and one tablet in the evening with or without food.  It would be best to  take the doses about the same time each day.  If you have difficulty swallowing the tablet whole please discuss with your pharmacist how to take the medication safely.  Take Eliquis exactly as prescribed by your doctor and DO NOT stop taking Eliquis without talking to the doctor who prescribed the medication.  Stopping may increase your risk of developing a new clot or stroke.  Refill your prescription before you run out.  After discharge, you should have regular check-up appointments with your healthcare provider that is prescribing your Eliquis.  In the future your dose may need to be changed if your kidney function or weight changes by a significant  amount or as you get older.  What do you do if you miss a dose? If you miss a dose, take it as soon as you remember on the same day and resume taking twice daily.  Do not take more than one dose of ELIQUIS at the same time.  Important Safety Information A possible side effect of Eliquis is bleeding. You should call your healthcare provider right away if you experience any of the following: ? Bleeding from an injury or your nose that does not stop. ? Unusual colored urine (red or dark brown) or unusual colored stools (red or black). ? Unusual bruising for unknown reasons. ? A serious fall or if you hit your head (even if there is no bleeding).  Some medicines may interact with Eliquis and might increase your risk of bleeding or clotting while on Eliquis. To help avoid this, consult your healthcare provider or pharmacist prior to using any new prescription or non-prescription medications, including herbals, vitamins, non-steroidal anti-inflammatory drugs (NSAIDs) and supplements.  This website has more information on Eliquis (apixaban): [email or web address].

## 2019-03-16 NOTE — Plan of Care (Signed)
Progressing towards goals

## 2019-03-16 NOTE — Progress Notes (Signed)
Speech Language Pathology Treatment: Dysphagia  Patient Details Name: Adrian [name] MRN: [id number] DOB: 28-Apr-1956 Today's Date: 03/16/2019 Time: [id number] SLP Time Calculation (min) (ACUTE ONLY): 10 min  Assessment / Plan / Recommendation Clinical Impression  Skilled treatment session focused on dysphagia goals. SLP spoke with pt's nurse who reports that pt is very diligent about utilizing compensatory swallow strategies. She also reports that pt likely experiences some degree of difficulty with current diet, therefore he is very willing to utilize strategies. SLP further facilitated session by providing skilled observation of pt consuming nectar thick liquids via tsp and puree. Pt able to utilize strategies to decrease aspiration risk. Pt with cough x 1 during consumption as he appeared to speak at the same time as swallowing.   Pt is aware that follow up services are needed to further swallow recovery and that another instrumental study is recommended prior to consuming thin liquids. Pt voices understanding. Pt appropriate to discharge on current diet d/t severity of deficits, acute progress is not likely in next several days.   HPI HPI: Adrian [name] is a 63 y.o. male coronary artery disease, tonsillar ca in remission, atrial fibrillation on anticoagulation which was held for surgery since last Tuesday, transferred emergently from [facility] hospital where he was admitted for diverticulitis of the large intestine with abscess requiring exploratory laparotomy, low anterior resection rigid proctoscopy, appendectomy, last seen normal at 7:30 PM on 03/08/2019 at [facility] when it was noticed that he had difficulty with speech- speech did not make sense. CTA head and neck was also done which revealed a left cervical carotid occlusion as well as left MCA occlusion. S/P bilateral common carotid and RT vertbral arteriograms followed by attempted revascularization of occluded Lt ICA prox .MCA  revascularization via collaterals TICI 2b to 2C . MRI shows Restricted diffusion in the left MCA middle to anterior division territory involving most of the insula, left operculum.       SLP Plan  Continue with current plan of care       Recommendations  Diet recommendations: Dysphagia 1 (puree);Nectar-thick liquid Liquids provided via: Teaspoon Medication Administration: Crushed with puree Supervision: Patient able to self feed;Full supervision/cueing for compensatory strategies Compensations: Slow rate;Small sips/bites;Multiple dry swallows after each bite/sip;Clear throat intermittently;Hard cough after swallow;Other (Comment);Effortful swallow Postural Changes and/or Swallow Maneuvers: Seated upright 90 degrees;Upright 30-60 min after meal                Oral Care Recommendations: Oral care BID Follow up Recommendations: Outpatient SLP SLP Visit Diagnosis: Dysphagia, pharyngeal phase (R13.13) Plan: Continue with current plan of care       GO                Oriya Kettering 03/16/2019, 10:39 AM

## 2019-03-16 NOTE — Plan of Care (Signed)
Plan of care adequate for discharge.

## 2019-03-16 NOTE — Progress Notes (Signed)
Adrian [name] [id number] Mar 09, 1956  CARE TEAM:  PCP: [name], MD  Outpatient Care Team: Patient Care Team: [name], MD as PCP - General (Family Medicine) Sueanne Margarita, MD as PCP - Cardiology (Cardiology) Sueanne Margarita, MD as Consulting Physician (Cardiology) Michael Boston, MD as Consulting Physician (General Surgery) Irene Shipper, MD as Consulting Physician (Gastroenterology) Izora Gala, MD as Consulting Physician (Otolaryngology) Garvin Fila, MD as Consulting Physician (Neurology)  Inpatient Treatment Team: Treatment Team: Attending Provider: Michael Boston, MD; Technician: Wylene Men, NT; Consulting Physician: Michael Boston, MD; Consulting Physician: Edison Pace, Md, MD; Utilization Review: Alease Medina, RN; Registered Nurse: Caroll Rancher, RN   Problem List:   Principal Problem:   Middle cerebral artery embolism, left Active Problems:   History of radiation therapy   Smokes tobacco daily   Hypothyroidism   NICM (nonischemic cardiomyopathy) ([employer])   CAD (coronary artery disease), native coronary artery   Diverticulitis of large intestine with abscess   Chronic atrial fibrillation   Current use of long term anticoagulation   Tonsil cancer [facility])   History of chemotherapy   Diverticular stricture ([place])   Expressive aphasia   Dysphagia   Hypokalemia  03/07/2019  POST-OPERATIVE DIAGNOSIS:  SIGMOID DIVERTICULITIS WITH HISTORY OF ABSCESS  PROCEDURE:   XI ROBOTIC ASSISTED LOW ANTERIOR RESECTION RIGID PROCTOSCOPY ASSESSMENT OF TISSUE PERFUSION BY FIREFLY IMMUNOFLUORESCNECE ROBOTIC ASSISTED APPENDECTOMY  SURGEON:  [name], MD, FACS, [place]  03/09/2019  Interventional Radiology S/P bilateral common carotid and RT vertbral arteriograms followed by attempted revascularization of occluded Lt ICA prox . MCA revascularization via collaterals TICI 2b to 2C .  Assessment  Stabilizing status post stroke cerebral  artery with improving expressive aphasia & dysphagia.   [facility] Stay = 9 days)  Plan:  -Continue Dysphagia diet per speech therapy.  Eating much better today  -Bowel functioning well - OK to adv diet as tolerated to solids once speech Tx determines safest course.    -Right groin swelling c/w pseudoanerysm  Thrombin injection done vascular surgery supervision.  Not worsening on full anticoagulation.  OK to mobilize according to vascular surgery  -pathology -consistent with diverticulitis.  Benign.  Discussed with patient. OK to d/c o/w from surgery standpoint  -on eliquis  -Expressive aphasia persists but slightly improving. - agree w speech therapy, PT/OT, anticoagulation  -VTE prophylaxis- SCDs, eliquis  - his ex wife is his main support system and will be available to assist in his care today  -Plan discharge today.    03/16/2019    Subjective: (Chief complaint)  Denies pain. Tolerating PO.     Objective:  Vital signs:  Vitals:   03/15/19 2343 03/16/19 0509 03/16/19 0600 03/16/19 0824  BP: (!) 102/55 (!) 92/48  (!) 90/45  Pulse: (!) 56 (!) 55  65  Resp:  18  16  Temp: 98.9 F (37.2 C) 99 F (37.2 C)  98 F (36.7 C)  TempSrc: Oral Oral  Oral  SpO2: 98% 97%  100%  Weight:   58.6 kg   Height:        Last BM Date: 03/13/19  Intake/Output   Yesterday:  08/08 0701 - 08/09 0700 In: 300 [P.O.:300] Out: -  This shift:  Total I/O In: 120 [P.O.:120] Out: -   Bowel function:  Flatus: YES  BM:  YES  Drain: (No drain)   Physical Exam:  General: Pt awake/alert/oriented x4 in no acute distress Eyes: PERRL, normal EOM.  Sclera  clear.  No icterus  Neuro: CN II-XII intact w/o focal sensory/motor deficits.  Expressive aphasia mild at this point.  Definitely improved this week.    Lymph: No head/neck/groin lymphadenopathy Psych:  No delerium/psychosis/paranoia HENT: Normocephalic, Mucus membranes moist.  No thrush Neck: Supple, No tracheal deviation  Chest: No chest wall pain w good excursion CV:  Pulses intact.  Regular rhythm MS: Normal AROM mjr joints.  No obvious deformity  Abdomen: Soft.  Nondistended.  Nontender.  Incisions w normal healing, tiny hematoma at right supraumbilical site. Evolving ecchymosis across R abdominal wall. Nontender. No cellulitis or abscess.  No evidence of peritonitis.  No incarcerated hernias.  Right groin with mild bulging &  moderate old ecchymosis.  No oozing/bleeding Ext:   No deformity.  No mjr edema.  No cyanosis Skin: No petechiae / purpura  Results:   Cultures: Recent Results (from the past 720 hour(s))  SARS Coronavirus 2 (Performed in [facility] hospital lab)     Status: None   Collection Time: 03/04/19  9:47 AM   Specimen: Nasal Swab  Result Value Ref Range Status   SARS Coronavirus 2 NEGATIVE NEGATIVE Final    Comment: (NOTE) SARS-CoV-2 target nucleic acids are NOT DETECTED. The SARS-CoV-2 RNA is generally detectable in upper and lower respiratory specimens during the acute phase of infection. Negative results do not preclude SARS-CoV-2 infection, do not rule out co-infections with other pathogens, and should not be used as the sole basis for treatment or other patient management decisions. Negative results must be combined with clinical observations, patient history, and epidemiological information. The expected result is Negative. Fact Sheet for Patients: SugarRoll.be Fact Sheet for Healthcare Providers: https://www.woods-mathews.com/ This test is not yet approved or cleared by the [place] FDA and  has been authorized for detection and/or diagnosis of SARS-CoV-2 by FDA under an Emergency Use Authorization (EUA). This EUA will remain  in effect (meaning this test can be used) for the duration of the COVID-19 declaration under Section 56 4(b)(1) of the Act, 21 U.S.C. section 360bbb-3(b)(1), unless the authorization is terminated or  revoked sooner. Performed at [facility], [place] [address], [place], [place] [address]   MRSA PCR Screening     Status: None   Collection Time: 03/08/19 10:44 PM   Specimen: Nasal Mucosa; Nasopharyngeal  Result Value Ref Range Status   MRSA by PCR NEGATIVE NEGATIVE Final    Comment:        The GeneXpert MRSA Assay (FDA approved for NASAL specimens only), is one component of a comprehensive MRSA colonization surveillance program. It is not intended to diagnose MRSA infection nor to guide or monitor treatment for MRSA infections. Performed at [facility], [place] [address] [place],  [address]     Labs: Results for orders placed or performed during the hospital encounter of 03/07/19 (from the past 48 hour(s))  Basic metabolic panel     Status: Abnormal   Collection Time: 03/15/19  3:18 AM  Result Value Ref Range   Sodium 131 (L) 135 - 145 mmol/L   Potassium 3.6 3.5 - 5.1 mmol/L   Chloride 96 (L) 98 - 111 mmol/L   CO2 25 22 - 32 mmol/L   Glucose, Bld 92 70 - 99 mg/dL   BUN 12 8 - 23 mg/dL   Creatinine, Ser 0.51 (L) 0.61 - 1.24 mg/dL   Calcium 8.4 (L) 8.9 - 10.3 mg/dL   GFR calc non Af Amer >60 >60 mL/min   GFR  calc Af Amer >60 >60 mL/min   Anion gap 10 5 - 15    Comment: Performed at [facility] [address] [place], [place] [address]  CBC     Status: Abnormal   Collection Time: 03/15/19  3:18 AM  Result Value Ref Range   WBC 3.5 (L) 4.0 - 10.5 K/uL   RBC 2.83 (L) 4.22 - 5.81 MIL/uL   Hemoglobin 8.8 (L) 13.0 - 17.0 g/dL   HCT 25.8 (L) 39.0 - 52.0 %   MCV 91.2 80.0 - 100.0 fL   MCH 31.1 26.0 - 34.0 pg   MCHC 34.1 30.0 - 36.0 g/dL   RDW 15.9 (H) 11.5 - 15.5 %   Platelets 169 150 - 400 K/uL   nRBC 0.0 0.0 - 0.2 %    Comment: Performed at [facility], [place] [address] [place], [place] [address]    Imaging / Studies: No results found.  Medications / Allergies: per chart  Antibiotics: Anti-infectives (From admission, onward)    Start     Dose/Rate Route Frequency Ordered Stop   03/09/19 0035  ceFAZolin (ANCEF) 2-4 GM/100ML-% IVPB    Note to Pharmacy: Luis Abed   : cabinet override      03/09/19 0035 03/09/19 1244   03/07/19 2200  cefoTEtan (CEFOTAN) 2 g in sodium chloride 0.9 % 100 mL IVPB     2 g 200 mL/hr over 30 Minutes Intravenous Every 12 hours 03/07/19 1700 03/07/19 2210   03/07/19 1434  clindamycin (CLEOCIN) 900 mg, gentamicin (GARAMYCIN) 240 mg in sodium chloride 0.9 % 1,000 mL for intraperitoneal lavage  Status:  Discontinued       As needed 03/07/19 1434 03/07/19 1456   03/07/19 1400  neomycin (MYCIFRADIN) tablet 1,000 mg  Status:  Discontinued     1,000 mg Oral 3 times per day 03/07/19 1026 03/07/19 1641   03/07/19 1400  metroNIDAZOLE (FLAGYL) tablet 1,000 mg  Status:  Discontinued     1,000 mg Oral 3 times per day 03/07/19 1026 03/07/19 1641   03/07/19 1030  cefoTEtan (CEFOTAN) 2 g in sodium chloride 0.9 % 100 mL IVPB     2 g 200 mL/hr over 30 Minutes Intravenous On call to O.R. 03/07/19 1026 03/07/19 1228   03/07/19 0600  clindamycin (CLEOCIN) 900 mg, gentamicin (GARAMYCIN) 240 mg in sodium chloride 0.9 % 1,000 mL for intraperitoneal lavage  Status:  Discontinued      Irrigation To Surgery 03/06/19 1018 03/07/19 1641        Note: Portions of this report may have been transcribed using voice recognition software. Every effort was made to ensure accuracy; however, inadvertent computerized transcription errors may be present.   Any transcriptional errors that result from this process are unintentional.

## 2019-03-17 ENCOUNTER — Telehealth: Payer: Self-pay

## 2019-03-17 NOTE — Telephone Encounter (Signed)
Virtual Visit Pre-Appointment Phone Call    TELEPHONE CALL NOTE  Adrian [name] has been deemed a candidate for a follow-up tele-health visit to limit community exposure during the Covid-19 pandemic. I spoke with the patient via phone to ensure availability of phone/video source, confirm preferred email & phone number, and discuss instructions and expectations.  I reminded Adrian [name] to be prepared with any vital sign and/or heart rhythm information that could potentially be obtained via home monitoring, at the time of his visit. I reminded Adrian [name] to expect a phone call prior to his visit.  Patient's wife agreed to consent below.  Adrian Gustin, RN 03/17/2019 3:09 PM     FULL LENGTH CONSENT FOR TELE-HEALTH VISIT   I hereby voluntarily request, consent and authorize [facility] and its employed or contracted physicians, physician assistants, nurse practitioners or other licensed health care professionals (the Practitioner), to provide me with telemedicine health care services (the "Services") as deemed necessary by the treating Practitioner. I acknowledge and consent to receive the Services by the Practitioner via telemedicine. I understand that the telemedicine visit will involve communicating with the Practitioner through live audiovisual communication technology and the disclosure of certain medical information by electronic transmission. I acknowledge that I have been given the opportunity to request an in-person assessment or other available alternative prior to the telemedicine visit and am voluntarily participating in the telemedicine visit.  I understand that I have the right to withhold or withdraw my consent to the use of telemedicine in the course of my care at any time, without affecting my right to future care or treatment, and that the Practitioner or I may terminate the telemedicine visit at any time. I understand that I have the right to inspect all information  obtained and/or recorded in the course of the telemedicine visit and may receive copies of available information for a reasonable fee.  I understand that some of the potential risks of receiving the Services via telemedicine include:  Marland Kitchen Delay or interruption in medical evaluation due to technological equipment failure or disruption; . Information transmitted may not be sufficient (e.g. poor resolution of images) to allow for appropriate medical decision making by the Practitioner; and/or  . In rare instances, security protocols could fail, causing a breach of personal health information.  Furthermore, I acknowledge that it is my responsibility to provide information about my medical history, conditions and care that is complete and accurate to the best of my ability. I acknowledge that Practitioner's advice, recommendations, and/or decision may be based on factors not within their control, such as incomplete or inaccurate data provided by me or distortions of diagnostic images or specimens that may result from electronic transmissions. I understand that the practice of medicine is not an exact science and that Practitioner makes no warranties or guarantees regarding treatment outcomes. I acknowledge that I will receive a copy of this consent concurrently upon execution via email to the email address I last provided but may also request a printed copy by calling the office of [facility].    I understand that my insurance will be billed for this visit.   I have read or had this consent read to me. . I understand the contents of this consent, which adequately explains the benefits and risks of the Services being provided via telemedicine.  . I have been provided ample opportunity to ask questions regarding this consent and the Services and have had my questions answered  to my satisfaction. . I give my informed consent for the services to be provided through the use of telemedicine in my medical care   By participating in this telemedicine visit I agree to the above.

## 2019-03-18 ENCOUNTER — Encounter: Payer: Self-pay | Admitting: Physician Assistant

## 2019-03-18 ENCOUNTER — Telehealth (INDEPENDENT_AMBULATORY_CARE_PROVIDER_SITE_OTHER): Payer: BC Managed Care – PPO | Admitting: Physician Assistant

## 2019-03-18 VITALS — BP 106/62 | HR 62 | Wt 129.0 lb

## 2019-03-18 DIAGNOSIS — Z720 Tobacco use: Secondary | ICD-10-CM

## 2019-03-18 DIAGNOSIS — I428 Other cardiomyopathies: Secondary | ICD-10-CM

## 2019-03-18 DIAGNOSIS — I6602 Occlusion and stenosis of left middle cerebral artery: Secondary | ICD-10-CM

## 2019-03-18 DIAGNOSIS — E785 Hyperlipidemia, unspecified: Secondary | ICD-10-CM

## 2019-03-18 DIAGNOSIS — I2510 Atherosclerotic heart disease of native coronary artery without angina pectoris: Secondary | ICD-10-CM

## 2019-03-18 DIAGNOSIS — I480 Paroxysmal atrial fibrillation: Secondary | ICD-10-CM

## 2019-03-18 MED ORDER — ATORVASTATIN CALCIUM 40 MG PO TABS: 40.0000 mg | ORAL_TABLET | Freq: Every day | ORAL | 3 refills | Status: DC

## 2019-03-18 MED ORDER — APIXABAN 5 MG PO TABS: 5.0000 mg | ORAL_TABLET | Freq: Two times a day (BID) | ORAL | 1 refills | Status: DC

## 2019-03-18 MED ORDER — NICOTINE 21 MG/24HR TD PT24: MEDICATED_PATCH | TRANSDERMAL | 0 refills | Status: DC

## 2019-03-18 MED ORDER — NEBIVOLOL HCL 10 MG PO TABS: 10.0000 mg | ORAL_TABLET | Freq: Every day | ORAL | 3 refills | Status: DC

## 2019-03-18 NOTE — Progress Notes (Signed)
Virtual Visit via Video Note   This visit type was conducted due to national recommendations for restrictions regarding the COVID-19 Pandemic (e.g. social distancing) in an effort to limit this patient's exposure and mitigate transmission in our community.  Due to his co-morbid illnesses, this patient is at least at moderate risk for complications without adequate follow up.  This format is felt to be most appropriate for this patient at this time.  All issues noted in this document were discussed and addressed.  A limited physical exam was performed with this format.  Please refer to the patient's chart for his consent to telehealth for [facility].   Date:  03/18/2019   ID:  Adrian [name], DOB Sep 23, 1955, MRN [id number]  Patient Location: Home Provider Location: Home  PCP:  [name], MD  Cardiologist:  Fransico Him, MD   Electrophysiologist:  None   Evaluation Performed:  Follow-Up Visit  Chief Complaint:  Hospital f/u  History of Present Illness:    Adrian [name] is a 63 y.o. male with history of atrial flutter in the setting of acute diverticulitis in 2017.  Also found to have a nonischemic cardiomyopathy LVEF 30 to 35% TEE showed probable left atrial appendage clot.  Nonobstructive CAD on cath in 2017 PAF on Eliquis for CHA2DS2-VASc of 2.  Follow-up echo 02/2016 normal LVEF 55 to 60%.  Patient underwent robotic resection for diverticulitis 03/07/2019.  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  He had expressive aphasia but no focal weakness otherwise. LICA possible chronic occlusion not amenable to stent, no thrombectomy.  He failed swallowing evaluation.  He then developed pseudoaneurysm the right groin treated with thrombin injection.  Neurology felt it was safe to transition back to oral anticoagulation with aggressive outpatient rehab.  His ex-wife offered to help take care of him.  Follow up study improved and he was transitioned to a dysphasia  diet.  2D echo 03/09/2019 normal LVEF 60 to 65%.  In reviewing his meds he has only been taking Eliquis once daily since he's been on it pre surgery. He is now taking it BID. Not taking lipitor but once a month. Just gets tired of taking meds. Strength and endurance very low. Ex wife thinks he's getting a little dehydrated. Having trouble getting fluids in him. No significant dizziness. Still smoking about 1/2-3/4 ppd.  The patient does not have symptoms concerning for COVID-19 infection (fever, chills, cough, or new shortness of breath).    Past Medical History:  Diagnosis Date  . AF (paroxysmal atrial fibrillation) ([employer] [facility]) 11/29/2018  . Atrial flutter ([place]) 10/12/2015  . CAD (coronary artery disease), native coronary artery 12/06/2015   a. 10/2015 MV: EF  37%, reversible defect inferior apex, intermediate risk findings; b. 10/2015 Cath: 20% mid RCA.  . Colonic diverticular abscess   . Diverticulitis   . History of chemotherapy 2005   Cisplatin  . Hypothyroidism   . NICM (nonischemic cardiomyopathy) ([place]) 10/14/2015   a. Tachy mediated?;  b. Echo 3/17 - Mild concentric LVH, EF 30-35%, anteroseptal, anterior, anterolateral, apical anterior, lateral hypokinesis, trivial MR, mild to moderately reduced RVSF; c. LHC 3/17 - mRCA 20%  . Paroxysmal atrial flutter ([place])    a. TEE 3/17 with ? LAA clot-->s/p TEE/DCCV 11/24/2015  . Radiation NOv.3,2005-Dec. 15, 2005   6810 cGy in 30 fractions  . 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  XRT   Past Surgical History:  Procedure Laterality Date  .  APPENDECTOMY N/A 03/07/2019   Procedure: ROBOTIC ASSISTED APPENDECTOMY;  Surgeon: Michael Boston, MD;  Location: [facility];  Service: General;  Laterality: N/A;  . CARDIAC CATHETERIZATION N/A 10/18/2015   Procedure: Left Heart Cath and Coronary Angiography;  Surgeon: Burnell Blanks, MD;  Location: [place] [facility];  Service: Cardiovascular;  Laterality: N/A;  . CARDIOVERSION N/A 11/24/2015   Procedure:  CARDIOVERSION;  Surgeon: Josue Hector, MD;  Location: [place];  Service: Cardiovascular;  Laterality: N/A;  . CYSTOSCOPY WITH STENT PLACEMENT Bilateral 03/07/2019   Procedure: CYSTOSCOPY WITH BILATERAL FIREFLY INJECTION;  Surgeon: Ardis Hughs, MD;  Location: [facility];  Service: Urology;  Laterality: Bilateral;  . GASTROSTOMY TUBE PLACEMENT  07/04/2004   IR - G tube for tonsilar cancer  . IR ANGIO INTRA EXTRACRAN SEL COM CAROTID INNOMINATE UNI R MOD SED  03/09/2019  . IR ANGIO VERTEBRAL SEL VERTEBRAL UNI R MOD SED  03/09/2019  . IR CT HEAD LTD  03/09/2019  . IR PERCUTANEOUS ART THROMBECTOMY/INFUSION INTRACRANIAL INC DIAG ANGIO  03/09/2019  . IR RADIOLOGIST EVAL & MGMT  12/18/2018  . LAMINECTOMY     C5/placement of steel plate  . NECK SURGERY  2003   replaced disk  . RADIOLOGY WITH ANESTHESIA N/A 03/08/2019   Procedure: IR WITH ANESTHESIA;  Surgeon: Luanne Bras, MD;  Location: [place];  Service: Radiology;  Laterality: N/A;  . TEE WITHOUT CARDIOVERSION N/A 10/15/2015   Procedure: TRANSESOPHAGEAL ECHOCARDIOGRAM (TEE);  Surgeon: Larey Dresser, MD;  Location: [place];  Service: Cardiovascular;  Laterality: N/A;  . TEE WITHOUT CARDIOVERSION N/A 11/24/2015   Procedure: TRANSESOPHAGEAL ECHOCARDIOGRAM (TEE);  Surgeon: Josue Hector, MD;  Location: [place];  Service: Cardiovascular;  Laterality: N/A;  . XI ROBOTIC ASSISTED COLOSTOMY TAKEDOWN N/A 03/07/2019   Procedure: XI ROBOTIC ASSISTED LOW ANTERIOR RESECTION, RIGID PROCTOSCOPY;  Surgeon: Michael Boston, MD;  Location: [facility];  Service: General;  Laterality: N/A;     Current Meds  Medication Sig  . acetaminophen (TYLENOL) 325 MG tablet Take 2 tablets (650 mg total) by mouth every 6 (six) hours as needed for mild pain (or Fever >/= 101).  Marland Kitchen apixaban (ELIQUIS) 5 MG TABS tablet Take 1 tablet (5 mg total) by mouth 2 (two) times daily.  . food thickener (RESOURCE THICKENUP CLEAR) POWD Take 120 g by mouth as needed (Follow package  instructions for nectar-thick liquids).  Marland Kitchen levothyroxine (SYNTHROID) 125 MCG tablet TAKE 1 TABLET BY MOUTH EVERY DAY  . Multiple Vitamin (MULTIVITAMIN WITH MINERALS) TABS tablet Take 1 tablet by mouth daily.  . nebivolol (BYSTOLIC) 10 MG tablet Take 1 tablet (10 mg total) by mouth daily.     Allergies:   Patient has no known allergies.   Social History   Tobacco Use  . Smoking status: Current Every Day Smoker    Packs/day: 0.50    Years: 20.00    Pack years: 10.00    Types: Cigarettes  . Smokeless tobacco: Never Used  . Tobacco comment: 1/2 pack per day  Substance Use Topics  . Alcohol use: Yes    Alcohol/week: 12.0 standard drinks    Types: 12 Cans of beer per week    Comment: couple of beers each day  . Drug use: No     Family Hx: The patient's family history includes Cancer in his brother; Colon cancer in his father; Prostate cancer in his father; Skin cancer in his mother. There is no history of Esophageal cancer, Rectal cancer, or Stomach cancer.  ROS:   Please see the history of present illness.      All other systems reviewed and are negative.   Prior CV studies:   The following studies were reviewed today: 2D echo 03/09/2019 [name]      1. The left ventricle has normal systolic function with an ejection fraction of 60-65%. The cavity size was normal. Left ventricular diastolic parameters were normal.  2. The right ventricle has normal systolic function. The cavity was normal. There is no increase in right ventricular wall thickness.  3. No evidence of mitral valve stenosis.  4. The tricuspid valve is grossly normal.  5. No stenosis of the aortic valve.  6. The aorta is normal in size and structure.  7. The aortic root and ascending aorta are normal in size and structure.  8. The interatrial septum was not assessed.   FINDINGS  Left Ventricle: The left ventricle has normal systolic function, with an ejection fraction of 60-65%. The cavity size was normal.  There is no increase in left ventricular wall thickness. Left ventricular diastolic parameters were normal.   Right Ventricle: The right ventricle has normal systolic function. The cavity was normal. There is no increase in right ventricular wall thickness.   Left Atrium: Left atrial size was normal in size.   Right Atrium: Right atrial size was normal in size. Right atrial pressure is estimated at 10 mmHg.   Interatrial Septum: The interatrial septum was not assessed.   Pericardium: There is no evidence of pericardial effusion.   Mitral Valve: The mitral valve is normal in structure. Mitral valve regurgitation is trivial by color flow Doppler. No evidence of mitral valve stenosis.   Tricuspid Valve: The tricuspid valve is grossly normal. Tricuspid valve regurgitation was not visualized by color flow Doppler.   Aortic Valve: The aortic valve is normal in structure. Aortic valve regurgitation was not visualized by color flow Doppler. There is No stenosis of the aortic valve.   Pulmonic Valve: The pulmonic valve was grossly normal. Pulmonic valve regurgitation is not visualized by color flow Doppler.   Aorta: The aortic root and ascending aorta are normal in size and structure. The aorta is normal in size and structure.   IR Angio intra extracran  IMPRESSION: Status post endovascular revascularization of probably chronically occluded left internal carotid artery at the bulb to the level of the petrous cavernous segment, with the left middle cerebral artery patency achieving a TICI 2b revascularization.   Reocclusion of the left internal carotid artery proximally as described above.   PLAN: Follow-up in the clinic 4 weeks post discharge.     Electronically Signed   By: Luanne Bras M.D.   On: 03/10/2019 09:27      Labs/Other Tests and Data Reviewed:    EKG:  An ECG dated 09/24/2018 was personally reviewed today and demonstrated:  Normal sinus rhythm, no acute change   Recent Labs: 11/29/2018: ALT 13 03/14/2019: Magnesium 2.0 03/15/2019: BUN 12; Creatinine, Ser 0.51; Hemoglobin 8.8; Platelets 169; Potassium 3.6; Sodium 131   Recent Lipid Panel Lab Results  Component Value Date/Time   CHOL 110 03/09/2019 06:56 AM   CHOL 159 03/09/2017 10:32 AM   TRIG 97 03/09/2019 06:56 AM   HDL 45 03/09/2019 06:56 AM   HDL 61 03/09/2017 10:32 AM   CHOLHDL 2.4 03/09/2019 06:56 AM   LDLCALC 46 03/09/2019 06:56 AM   LDLCALC 66 03/09/2017 10:32 AM   LDLDIRECT 143.3 06/20/2012 11:48 AM    Wt Readings from  Last 3 Encounters:  03/18/19 129 lb (58.5 kg)  03/16/19 129 lb 3 oz (58.6 kg)  03/06/19 132 lb (59.9 kg)     Objective:    Vital Signs:  BP 106/62   Pulse 62   Wt 129 lb (58.5 kg)   BMI 19.61 kg/m  Thin  VITAL SIGNS:  reviewed GEN:  no acute distress RESPIRATORY:  normal respiratory effort, symmetric expansion CARDIOVASCULAR:  no peripheral edema  ASSESSMENT & PLAN:    1.  PAF found in the setting of acute diverticulitis in 2017 with left atrial clot on echo on Eliquis underwent cardioversion 11/2015 and had no recurrence-no evidence of clot on echo 03/09/2019. Patient admits to taking Eliquis only once daily for many years. Now taking it bid since his CVA. 2. Status post middle cerebral artery embolic stroke postop day #2 after robotic resection for diverticulitis 03/07/2019 underwent intensive rehab and followed closely by neurology. Chronic LICA see above procedure 3. History nonischemic cardiomyopathy LVEF 30 to 35% in 2017 most recent echo 03/09/2019 normal LV function 4. Nonobstructive CAD on cardiac cath in 2017 5. Hyperlipidemia-only taking lipitor once monthly. Needs to take it daily 6. Tobacco abuse-smoking cessation discussed in detail and will prescribe nicotine patch  COVID-19 Education: The signs and symptoms of COVID-19 were discussed with the patient and how to seek care for testing (follow up with PCP or arrange E-visit).  The importance of social  distancing was discussed today.  Time:   Today, I have spent 19:23 minutes with the patient with telehealth technology discussing the above problems.     Medication Adjustments/Labs and Tests Ordered: Current medicines are reviewed at length with the patient today.  Concerns regarding medicines are outlined above.   Tests Ordered: No orders of the defined types were placed in this encounter.   Medication Changes: No orders of the defined types were placed in this encounter.   Follow Up:  Virtual Visit in 2 month(s) Dr. [name]  Signed Ermalinda Barrios, PA-C  03/18/2019 2:23 PM    [place]

## 2019-03-18 NOTE — Patient Instructions (Addendum)
Medication Instructions:  Your physician has recommended you make the following change in your medication:   1. TAKE: apixaban (Eliquis) 5 mg tablet by mouth TWICE a day  2. INCREASE: atorvastatin (lipitor) to 40 mg once a day  3. A prescription for nicotine patches has been sent in to your pharmacy. Use as directed   Lab work: None Ordered  If you have labs (blood work) drawn today and your tests are completely normal, you will receive your results only by: Marland Kitchen MyChart Message (if you have MyChart) OR . A paper copy in the mail If you have any lab test that is abnormal or we need to change your treatment, we will call you to review the results.  Testing/Procedures: None ordered  Follow-Up: Follow up with Dr. [name] on 05/20/19 at 9:00 AM  Any Other Special Instructions Will Be Listed Below (If Applicable).  Call the office for Systolic Blood Pressure (Top number) less than 105.   Steps to Quit Smoking Smoking tobacco is the leading cause of preventable death. It can affect almost every organ in the body. Smoking puts you and people around you at risk for many serious, long-lasting (chronic) diseases. Quitting smoking can be hard, but it is one of the best things that you can do for your health. It is never too late to quit. How do I get ready to quit? When you decide to quit smoking, make a plan to help you succeed. Before you quit:  Pick a date to quit. Set a date within the next 2 weeks to give you time to prepare.  Write down the reasons why you are quitting. Keep this list in places where you will see it often.  Tell your family, friends, and co-workers that you are quitting. Their support is important.  Talk with your doctor about the choices that may help you quit.  Find out if your health insurance will pay for these treatments.  Know the people, places, things, and activities that make you want to smoke (triggers). Avoid them. What first steps can I take to quit  smoking?  Throw away all cigarettes at home, at work, and in your car.  Throw away the things that you use when you smoke, such as ashtrays and lighters.  Clean your car. Make sure to empty the ashtray.  Clean your home, including curtains and carpets. What can I do to help me quit smoking? Talk with your doctor about taking medicines and seeing a counselor at the same time. You are more likely to succeed when you do both.  If you are pregnant or breastfeeding, talk with your doctor about counseling or other ways to quit smoking. Do not take medicine to help you quit smoking unless your doctor tells you to do so. To quit smoking: Quit right away  Quit smoking totally, instead of slowly cutting back on how much you smoke over a period of time.  Go to counseling. You are more likely to quit if you go to counseling sessions regularly. Take medicine You may take medicines to help you quit. Some medicines need a prescription, and some you can buy over-the-counter. Some medicines may contain a drug called nicotine to replace the nicotine in cigarettes. Medicines may:  Help you to stop having the desire to smoke (cravings).  Help to stop the problems that come when you stop smoking (withdrawal symptoms). Your doctor may ask you to use:  Nicotine patches, gum, or lozenges.  Nicotine inhalers or sprays.  Non-nicotine medicine that is taken by mouth. Find resources Find resources and other ways to help you quit smoking and remain smoke-free after you quit. These resources are most helpful when you use them often. They include:  Online chats with a Social worker.  Phone quitlines.  Printed Furniture conservator/restorer.  Support groups or group counseling.  Text messaging programs.  Mobile phone apps. Use apps on your mobile phone or tablet that can help you stick to your quit plan. There are many free apps for mobile phones and tablets as well as websites. Examples include Quit Guide from the  [employer] and smokefree.gov  What things can I do to make it easier to quit?   Talk to your family and friends. Ask them to support and encourage you.  Call a phone quitline (1-800-QUIT-NOW), reach out to support groups, or work with a Social worker.  Ask people who smoke to not smoke around you.  Avoid places that make you want to smoke, such as: ? Bars. ? Parties. ? Smoke-break areas at work.  Spend time with people who do not smoke.  Lower the stress in your life. Stress can make you want to smoke. Try these things to help your stress: ? Getting regular exercise. ? Doing deep-breathing exercises. ? Doing yoga. ? Meditating. ? Doing a body scan. To do this, close your eyes, focus on one area of your body at a time from head to toe. Notice which parts of your body are tense. Try to relax the muscles in those areas. How will I feel when I quit smoking? Day 1 to 3 weeks Within the first 24 hours, you may start to have some problems that come from quitting tobacco. These problems are very bad 2-3 days after you quit, but they do not often last for more than 2-3 weeks. You may get these symptoms:  Mood swings.  Feeling restless, nervous, angry, or annoyed.  Trouble concentrating.  Dizziness.  Strong desire for high-sugar foods and nicotine.  Weight gain.  Trouble pooping (constipation).  Feeling like you may vomit (nausea).  Coughing or a sore throat.  Changes in how the medicines that you take for other issues work in your body.  Depression.  Trouble sleeping (insomnia). Week 3 and afterward After the first 2-3 weeks of quitting, you may start to notice more positive results, such as:  Better sense of smell and taste.  Less coughing and sore throat.  Slower heart rate.  Lower blood pressure.  Clearer skin.  Better breathing.  Fewer sick days. Quitting smoking can be hard. Do not give up if you fail the first time. Some people need to try a few times before they  succeed. Do your best to stick to your quit plan, and talk with your doctor if you have any questions or concerns. Summary  Smoking tobacco is the leading cause of preventable death. Quitting smoking can be hard, but it is one of the best things that you can do for your health.  When you decide to quit smoking, make a plan to help you succeed.  Quit smoking right away, not slowly over a period of time.  When you start quitting, seek help from your doctor, family, or friends. This information is not intended to replace advice given to you by your health care provider. Make sure you discuss any questions you have with your health care provider. Document Released: 05/20/2009 Document Revised: 10/11/2018 Document Reviewed: 10/12/2018 Elsevier Patient Education  2020 [employer].

## 2019-03-20 ENCOUNTER — Telehealth: Payer: Self-pay | Admitting: Physician Assistant

## 2019-03-20 ENCOUNTER — Telehealth: Payer: Self-pay | Admitting: Neurology

## 2019-03-20 NOTE — Telephone Encounter (Signed)
[facility] stating that they cannot fill this medication because the directions are unclear on the script for Nicoderm CQ 21 mg/24hr patch. Please address

## 2019-03-20 NOTE — Telephone Encounter (Signed)
Pt was seen by Dr. Leonie Man in the hosp and is scheduled for out pt PT/OT and the wide needs to speak to the provider to see if they can get pt for in home pt/ot instead of out pt/ can reach Lam[place] @ [phone]

## 2019-03-20 NOTE — Telephone Encounter (Signed)
Spoke with Crystal..with [facility] and specified RX for nicoderm patch as 3 separate RX's and noted on each after finishing prior patches .... 21mg  for 6 weeks, 14mg  for 2 weeks, 7mg  for 2 weeks.

## 2019-03-21 ENCOUNTER — Telehealth: Payer: Self-pay | Admitting: Neurology

## 2019-03-21 ENCOUNTER — Telehealth: Payer: Self-pay | Admitting: Cardiology

## 2019-03-21 MED ORDER — NICOTINE 14 MG/24HR TD PT24: MEDICATED_PATCH | TRANSDERMAL | 0 refills | Status: DC

## 2019-03-21 MED ORDER — NICOTINE 21 MG/24HR TD PT24: MEDICATED_PATCH | TRANSDERMAL | 0 refills | Status: DC

## 2019-03-21 MED ORDER — NICOTINE 7 MG/24HR TD PT24: MEDICATED_PATCH | TRANSDERMAL | 0 refills | Status: DC

## 2019-03-21 NOTE — Addendum Note (Signed)
Addended by: Drue Novel I on: 03/21/2019 12:08 PM   Modules accepted: Orders

## 2019-03-21 NOTE — Telephone Encounter (Signed)
New Message    Patients wife is calling on his behalf. She is wanting clarification on how the patient should be taking his medications based on his BP readings. Please call to discuss.

## 2019-03-21 NOTE — Telephone Encounter (Signed)
Not sure why they would not except the tapered RX since the local pharmacies except it this way, however, 3 new RXs sent in to pharmacy.

## 2019-03-21 NOTE — Telephone Encounter (Signed)
Pt's wife called stating that she is needing the pt to be seen before the 14th of September when his disability ends or if he cant be seen sooner if he can get switched to a provider that has a sooner availability. Please advise.

## 2019-03-21 NOTE — Telephone Encounter (Signed)
Pts wife is concerned about his HR and BP in regards to him continuing to take the Bystolic.    115/57 HR 64 9am  TODAY  Recent Vitals:  110/58 8/13 106/62 HR 62 8/13 95/52 HR 61 8/12  Routing to [employer] for advisement.

## 2019-03-24 NOTE — Telephone Encounter (Signed)
BP's are fine. One reading is a little low 95/52. If he's dizzy or symptomatic could decrease bystolic to 5mg  once daily otherwise stay on 10 mg daily and continue to monitor. Thanks.

## 2019-03-24 NOTE — Telephone Encounter (Signed)
I called [name] on pts contact list. There is no DPR because pt has not been seen at [facility] for appt. The wife stated Dr.Gross the pts surgeon did the original disability form for 6 weeks but it will be ending on 04/21/2019, also pt will be release by him. Archie Patten stated they need a sooner appt because of pts job and his duties. She has a lot of questions about the pts hospitalization for the stroke. She stated the day of the discharge no talk to her about the discharge. I ask Archie Patten did a nurse come and review the discharge summary with her and the pt. Archie Patten stated she has medical background and look over all the papers for his discharge. But no one came and discuss his discharge follow up or anything. I reschedule pt for a earlier appt with Dr. Leonie Man. I explain the COVID 19 rules with mask requirement, one visitor allowed and no kids.

## 2019-03-24 NOTE — Telephone Encounter (Signed)
I spoke to [place] pts wife. She stated pt is receiving outpatient therapy. She wanted him to have in home therapy because she works from home. I stated per the hospital notes it was recommend by the therapist he do outpatient therapy. The wife verbalized understanding and pt is schedule to see the neuro out patient therapist this week.

## 2019-03-24 NOTE — Telephone Encounter (Signed)
Okay to be seen sooner by either me or Janett Billow and all paperwork can be addressed at that visit

## 2019-03-25 NOTE — Telephone Encounter (Signed)
Called and spoke to patient's wife. She states that the patient is not having any dizziness, lightheadedness, syncope or any other Sx. Made patient aware of recommendations below. Patient will continue to monitor BP and call the office if his SBP is consistently <105 or if he becomes symptomatic.

## 2019-03-25 NOTE — Telephone Encounter (Signed)
Patient schedule with DR.Marland KitchenSEthi for disability form or work evaluation in April 15, 2019.

## 2019-03-27 ENCOUNTER — Other Ambulatory Visit: Payer: Self-pay

## 2019-03-27 ENCOUNTER — Encounter: Payer: Self-pay | Admitting: Physical Therapy

## 2019-03-27 ENCOUNTER — Ambulatory Visit: Payer: BC Managed Care – PPO | Admitting: Occupational Therapy

## 2019-03-27 ENCOUNTER — Ambulatory Visit: Payer: BC Managed Care – PPO | Admitting: Speech Pathology

## 2019-03-27 ENCOUNTER — Ambulatory Visit: Payer: BC Managed Care – PPO | Attending: Nurse Practitioner | Admitting: Physical Therapy

## 2019-03-27 DIAGNOSIS — R278 Other lack of coordination: Secondary | ICD-10-CM

## 2019-03-27 DIAGNOSIS — R29818 Other symptoms and signs involving the nervous system: Secondary | ICD-10-CM | POA: Diagnosis present

## 2019-03-27 DIAGNOSIS — M6281 Muscle weakness (generalized): Secondary | ICD-10-CM | POA: Insufficient documentation

## 2019-03-27 DIAGNOSIS — R41841 Cognitive communication deficit: Secondary | ICD-10-CM | POA: Insufficient documentation

## 2019-03-27 DIAGNOSIS — R471 Dysarthria and anarthria: Secondary | ICD-10-CM | POA: Diagnosis present

## 2019-03-27 DIAGNOSIS — R4701 Aphasia: Secondary | ICD-10-CM

## 2019-03-27 DIAGNOSIS — I69318 Other symptoms and signs involving cognitive functions following cerebral infarction: Secondary | ICD-10-CM | POA: Insufficient documentation

## 2019-03-27 DIAGNOSIS — R2681 Unsteadiness on feet: Secondary | ICD-10-CM | POA: Diagnosis present

## 2019-03-27 DIAGNOSIS — R1312 Dysphagia, oropharyngeal phase: Secondary | ICD-10-CM | POA: Diagnosis present

## 2019-03-27 NOTE — Therapy (Signed)
[facility] [address] [place], [place], [address] Phone: [phone]   Fax:  [phone]  Physical Therapy Evaluation  Patient Details  Name: Adrian [name] MRN: [id number] Date of Birth: 07-24-1956 Referring Provider (PT): [name], MD (PCP) referred to therapy by NP at hospital   Encounter Date: 03/27/2019  PT End of Session - 03/27/19 1259    Visit Number  1    Number of Visits  7    Date for PT Re-Evaluation  05/11/19    Authorization Type  BC[employer]    PT Start Time  0930    PT Stop Time  1020    PT Time Calculation (min)  50 min    Activity Tolerance  Patient tolerated treatment well    Behavior During Therapy  [facility] for tasks assessed/performed       Past Medical History:  Diagnosis Date  . AF (paroxysmal atrial fibrillation) ([place]) 11/29/2018  . Atrial flutter ([place]) 10/12/2015  . CAD (coronary artery disease), native coronary artery 12/06/2015   a. 10/2015 MV: EF  37%, reversible defect inferior apex, intermediate risk findings; b. 10/2015 Cath: 20% mid RCA.  . Colonic diverticular abscess   . Diverticulitis   . History of chemotherapy 2005   Cisplatin  . Hypothyroidism   . NICM (nonischemic cardiomyopathy) ([place]) 10/14/2015   a. Tachy mediated?;  b. Echo 3/17 - Mild concentric LVH, EF 30-35%, anteroseptal, anterior, anterolateral, apical anterior, lateral hypokinesis, trivial MR, mild to moderately reduced RVSF; c. LHC 3/17 - mRCA 20%  . Paroxysmal atrial flutter ([name])    a. TEE 3/17 with ? LAA clot-->s/p TEE/DCCV 11/24/2015  . Radiation NOv.3,2005-Dec. 15, 2005   6810 cGy in 30 fractions  . Tonsil cancer [facility]) 2005   Dr Romeo Rabon.  XRT    Past Surgical History:  Procedure Laterality Date  . APPENDECTOMY N/A 03/07/2019   Procedure: ROBOTIC ASSISTED APPENDECTOMY;  Surgeon: Michael Boston, MD;  Location: [facility];  Service: General;  Laterality: N/A;  . CARDIAC CATHETERIZATION N/A 10/18/2015   Procedure: Left  Heart Cath and Coronary Angiography;  Surgeon: Burnell Blanks, MD;  Location: [facility];  Service: Cardiovascular;  Laterality: N/A;  . CARDIOVERSION N/A 11/24/2015   Procedure: CARDIOVERSION;  Surgeon: Josue Hector, MD;  Location: [facility];  Service: Cardiovascular;  Laterality: N/A;  . CYSTOSCOPY WITH STENT PLACEMENT Bilateral 03/07/2019   Procedure: CYSTOSCOPY WITH BILATERAL FIREFLY INJECTION;  Surgeon: Ardis Hughs, MD;  Location: [facility];  Service: Urology;  Laterality: Bilateral;  . GASTROSTOMY TUBE PLACEMENT  07/04/2004   IR - G tube for tonsilar cancer  . IR ANGIO INTRA EXTRACRAN SEL COM CAROTID INNOMINATE UNI R MOD SED  03/09/2019  . IR ANGIO VERTEBRAL SEL VERTEBRAL UNI R MOD SED  03/09/2019  . IR CT HEAD LTD  03/09/2019  . IR PERCUTANEOUS ART THROMBECTOMY/INFUSION INTRACRANIAL INC DIAG ANGIO  03/09/2019  . IR RADIOLOGIST EVAL & MGMT  12/18/2018  . LAMINECTOMY     C5/placement of steel plate  . NECK SURGERY  2003   replaced disk  . RADIOLOGY WITH ANESTHESIA N/A 03/08/2019   Procedure: IR WITH ANESTHESIA;  Surgeon: Luanne Bras, MD;  Location: [facility];  Service: Radiology;  Laterality: N/A;  . TEE WITHOUT CARDIOVERSION N/A 10/15/2015   Procedure: TRANSESOPHAGEAL ECHOCARDIOGRAM (TEE);  Surgeon: Larey Dresser, MD;  Location: [facility];  Service: Cardiovascular;  Laterality: N/A;  . TEE WITHOUT CARDIOVERSION N/A 11/24/2015   Procedure: TRANSESOPHAGEAL  ECHOCARDIOGRAM (TEE);  Surgeon: Josue Hector, MD;  Location: [facility];  Service: Cardiovascular;  Laterality: N/A;  . XI ROBOTIC ASSISTED COLOSTOMY TAKEDOWN N/A 03/07/2019   Procedure: XI ROBOTIC ASSISTED LOW ANTERIOR RESECTION, RIGID PROCTOSCOPY;  Surgeon: Michael Boston, MD;  [facility]: [facility];  Service: General;  Laterality: N/A;    There were no vitals filed for this visit.   Subjective Assessment - 03/27/19 0944    Subjective  Didn't realize he had a stroke until his wife noticed on the phone - speech  was getting worse.  Is staying with his wife (separated) right now but plans to return to his home.  Pt does not notice any difference in strength, feels a little unsure of his balance but has been helping with house chores, walking 1/2 mile wiht dog and out in the community.  No falls or stumbles.  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  Wants to get back to work.    Patient is accompained by:  Family member    Pertinent History  Pt and wife are separated; pt staying with wife right now; tonsil cancer with radiation and chemotherapy in 2005, cardiomyopathy, atrial flutter, hypothyroidism, diverticulitis with abcess, CAD, and C5 laminectomy    Patient Stated Goals  Wants to get back to work at [employer]    Currently in Pain?  No/denies         [facility] PT Assessment - 03/27/19 0954      Assessment   Medical Diagnosis  L MCA    Referring Provider (PT)  [name], MD (PCP) referred to therapy by NP at hospital    Onset Date/Surgical Date  03/08/19    Hand Dominance  Right      Precautions   Precautions  Other (comment)    Precaution Comments  mechanical soft diet, no driving, tonsil cancer with radiation and chemotherapy in 2005, cardiomyopathy, atrial flutter, hypothyroidism, diverticulitis with abcess, CAD, and C5 laminectomy      Balance Screen   Has the patient fallen in the past 6 months  No      [place] residence    Living Arrangements  Spouse/significant other;Alone   currently with wife (Separated); usually lives alone   Type of [place] to enter    Entrance Stairs-Number of Steps  2    [place]  Two level;Able to live on main level with bedroom/bathroom    Alternate Level Stairs-Number of Steps  [place]  None    Additional Comments  Pt plans to return to his home - 2 small steps to enter.  One level.  Lives alone, except with dog.      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      Observation/Other Assessments   Focus on Therapeutic Outcomes (FOTO)   N/A      Sensation   Light Touch  Appears Intact      Functional Tests   Functional tests  Squat      Squat   Comments  x 10 low to floor without UE support      ROM / Strength   AROM / PROM / Strength  Strength      Strength   Overall Strength  Within functional  limits for tasks performed      Ambulation/Gait   Ambulation/Gait  Yes    Ambulation/Gait Assistance  6: Modified independent (Device/Increase time)    Ambulation Distance (Feet)  230 Feet    Assistive device  None    Gait Pattern  Step-through pattern;Wide base of support    Ambulation Surface  Level;Indoor    Gait velocity  3.6 ft/sec mildly slower than patient's baseline    Stairs  Yes    Stairs Assistance  7: Independent    Stair Management Technique  No rails;Alternating pattern;Forwards    Number of Stairs  8    Height of Stairs  6      Standardized Balance Assessment   Standardized Balance Assessment  Five Times Sit to Stand;10 meter walk test    Five times sit to stand comments   8.69 no UE support    10 Meter Walk  9.09 seconds or 3.6 ft/sec      High Level Balance   High Level Balance Comments  --      Functional Gait  Assessment   Gait assessed   Yes    Gait Level Surface  Walks 20 ft in less than 5.5 sec, no assistive devices, good speed, no evidence for imbalance, normal gait pattern, deviates no more than 6 in outside of the 12 in walkway width.    Change in Gait Speed  Able to smoothly change walking speed without loss of balance or gait deviation. Deviate no more than 6 in outside of the 12 in walkway width.    Gait with Horizontal Head Turns  Performs head turns smoothly with slight change in gait velocity (eg, minor disruption to smooth gait path), deviates 6-10 in outside 12 in walkway width, or uses an assistive device.     Gait with Vertical Head Turns  Performs head turns with no change in gait. Deviates no more than 6 in outside 12 in walkway width.    Gait and Pivot Turn  Pivot turns safely within 3 sec and stops quickly with no loss of balance.    Step Over Obstacle  Is able to step over 2 stacked shoe boxes taped together (9 in total height) without changing gait speed. No evidence of imbalance.    Gait with Narrow Base of Support  Is able to ambulate for 10 steps heel to toe with no staggering.    Gait with Eyes Closed  Walks 20 ft, uses assistive device, slower speed, mild gait deviations, deviates 6-10 in outside 12 in walkway width. Ambulates 20 ft in less than 9 sec but greater than 7 sec.    Ambulating Backwards  Walks 20 ft, no assistive devices, good speed, no evidence for imbalance, normal gait    Steps  Alternating feet, no rail.    Total Score  28    FGA comment:  28/30                Objective measurements completed on examination: See above findings.              PT Education - 03/27/19 1258    Education Details  clinical findings, PT POC and goals for work hardening, PT at [employer] for closer proximity to patient    Person(s) Educated  Patient;Spouse    Methods  Explanation    Comprehension  Verbalized understanding          PT Long Term Goals - 03/27/19 2116  PT LONG TERM GOAL #1   Title  Pt will demonstrate independence with balance and strengthening HEP    Time  6    Period  Weeks    Status  New    Target Date  05/11/19      PT LONG TERM GOAL #2   Title  Pt will improve FGA to 30/30    Baseline  28/30    Time  6    Period  Weeks    Status  New    Target Date  05/11/19      PT LONG TERM GOAL #3   Title  Pt will improve gait velocity to >/= 4.0 ft/sec    Baseline  3.6 ft/sec    Time  6    Period  Weeks    Status  New    Target Date  05/11/19      PT LONG TERM GOAL #4   Title  Pt will demonstrate ability to safely negotiate a ladder     Time  6    Period  Weeks    Status  New    Target Date  05/11/19      PT LONG TERM GOAL #5   Title  Pt will demonstrate ability to squat, lift and carry containers containing up to 50 lbs    Time  6    Period  Weeks    Status  New    Target Date  05/11/19      PT LONG TERM GOAL #6   Title  Pt will demonstrate ability to load and move objects >70 lbs with hand truck/dolly    Time  6    Period  Weeks    Status  New    Target Date  05/11/19             Plan - 03/27/19 1300    Clinical Impression Statement  Pt is a 63 year old male referred to Neuro OPPT for evaluation of deficits following L MCA CVA after resection for diverticulosis and being taken off of Eliquis.  Pt's PMH is significant for the following: tonsil cancer with radiation and chemotherapy in 2005, cardiomyopathy, atrial flutter, hypothyroidism, diverticulitis with abscess, CAD, and C5 laminectomy. The following deficits were noted during pt's exam: impaired functional strength to perform job requirements, impaired endurance and activity tolerance, impaired balance and slowed gait velocity.  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 Pt would benefit from skilled PT to address these impairments and functional limitations to maximize functional mobility independence and reduce falls risk.    Personal Factors and Comorbidities  Comorbidity 3+;Profession    Comorbidities  tonsil cancer with radiation and chemotherapy in 2005, cardiomyopathy, atrial flutter, hypothyroidism, diverticulitis with abcess, CAD, and C5 laminectomy    Examination-Activity Limitations  Carry;Lift;Reach Overhead;Squat    Examination-Participation Restrictions  Driving;Other   work duties   Copy  Evolving/Moderate complexity    Clinical Decision Making  Moderate    Rehab Potential  Good    PT Frequency  1x / week    PT Duration  6 weeks    PT Treatment/Interventions  ADLs/Self Care Home Management;Gait training;Stair training;Functional mobility training;Therapeutic activities;Therapeutic exercise;Balance training;Neuromuscular re-education;Patient/family education;Other (comment)   work Lobbyist  PT Next Visit Plan  initiate HEP and work hardening program based on [place] job requirements    Consulted and Agree with Plan of Care  Patient;Family member/caregiver    Family Member Consulted  wife -currently separated from       Patient will benefit from skilled therapeutic intervention in order to improve the following deficits and impairments:  Decreased balance, Decreased endurance, Difficulty walking, Decreased strength  Visit Diagnosis: Muscle weakness (generalized)  Unsteadiness on feet  Other symptoms and signs involving the nervous system     Problem List Patient Active Problem List   Diagnosis Date Noted  . Hypokalemia 03/12/2019  . Expressive aphasia 03/10/2019  . Dysphagia 03/10/2019  . Middle cerebral artery embolism, left 03/09/2019  . Diverticular stricture ([place]) 03/07/2019  . Bacteria in urine   . Chronic atrial fibrillation 12/02/2018  . Current use of long term anticoagulation 12/02/2018  . UTI (urinary tract infection) 11/29/2018  . Hyponatremia 11/29/2018  . PAF (paroxysmal atrial fibrillation) ([place]) 11/29/2018  . Diverticulitis of large intestine with abscess 09/23/2018  . Dyslipidemia 12/05/2017  . CAD (coronary artery disease), native coronary artery 12/06/2015  . SOB (shortness of breath)   . NICM (nonischemic cardiomyopathy) ([place])   . Thyroid activity decreased   . Hypothyroidism 08/12/2013  . History of radiation therapy 05/30/2012  . Smokes tobacco daily 05/30/2012  . Cancer of tonsillar fossa ([place]) 11/27/2011  . Tonsil  cancer ([place]) 2005  . History of chemotherapy 2005    Rico Junker, PT, DPT 03/27/19    9:25 PM    [facility] [address] [place], [place], [address] Phone: [phone]   Fax:  [phone]  Name: JABRIL PURSELL MRN: [id number] Date of Birth: March 13, 1956

## 2019-03-27 NOTE — Therapy (Signed)
[facility] [address] [place], [place], [address] Phone: [phone]   Fax:  [phone]  Occupational Therapy Evaluation  Patient Details  Name: Adrian [name] MRN: [id number] Date of Birth: 63-Apr-1957 Referring Provider (OT): Carolann Littler   Encounter Date: 03/27/2019  OT End of Session - 03/27/19 1445    Visit Number  1    Number of Visits  9    Authorization Type  BC/BS    OT Start Time  5277    OT Stop Time  1100    OT Time Calculation (min)  45 min    Activity Tolerance  Patient tolerated treatment well    Behavior During Therapy  [facility] for tasks assessed/performed       Past Medical History:  Diagnosis Date  . AF (paroxysmal atrial fibrillation) ([place]) 11/29/2018  . Atrial flutter ([place]) 10/12/2015  . CAD (coronary artery disease), native coronary artery 12/06/2015   a. 10/2015 MV: EF  37%, reversible defect inferior apex, intermediate risk findings; b. 10/2015 Cath: 20% mid RCA.  . Colonic diverticular abscess   . Diverticulitis   . History of chemotherapy 2005   Cisplatin  . Hypothyroidism   . NICM (nonischemic cardiomyopathy) ([place]) 10/14/2015   a. Tachy mediated?;  b. Echo 3/17 - Mild concentric LVH, EF 30-35%, anteroseptal, anterior, anterolateral, apical anterior, lateral hypokinesis, trivial MR, mild to moderately reduced RVSF; c. LHC 3/17 - mRCA 20%  . Paroxysmal atrial flutter ([place])    a. TEE 3/17 with ? LAA clot-->s/p TEE/DCCV 11/24/2015  . Radiation NOv.3,2005-Dec. 15, 2005   6810 cGy in 30 fractions  . Tonsil cancer [facility]) 2005   Dr Romeo Rabon.  XRT    Past Surgical History:  Procedure Laterality Date  . APPENDECTOMY N/A 03/07/2019   Procedure: ROBOTIC ASSISTED APPENDECTOMY;  Surgeon: Michael Boston, MD;  Location: [facility];  Service: General;  Laterality: N/A;  . CARDIAC CATHETERIZATION N/A 10/18/2015   Procedure: Left Heart Cath and Coronary Angiography;  Surgeon: Burnell Blanks, MD;  Location:  [place] [facility];  Service: Cardiovascular;  Laterality: N/A;  . CARDIOVERSION N/A 11/24/2015   Procedure: CARDIOVERSION;  Surgeon: Josue Hector, MD;  Location: [place] [facility];  Service: Cardiovascular;  Laterality: N/A;  . CYSTOSCOPY WITH STENT PLACEMENT Bilateral 03/07/2019   Procedure: CYSTOSCOPY WITH BILATERAL FIREFLY INJECTION;  Surgeon: Ardis Hughs, MD;  Location: [facility];  Service: Urology;  Laterality: Bilateral;  . GASTROSTOMY TUBE PLACEMENT  07/04/2004   IR - G tube for tonsilar cancer  . IR ANGIO INTRA EXTRACRAN SEL COM CAROTID INNOMINATE UNI R MOD SED  03/09/2019  . IR ANGIO VERTEBRAL SEL VERTEBRAL UNI R MOD SED  03/09/2019  . IR CT HEAD LTD  03/09/2019  . IR PERCUTANEOUS ART THROMBECTOMY/INFUSION INTRACRANIAL INC DIAG ANGIO  03/09/2019  . IR RADIOLOGIST EVAL & MGMT  12/18/2018  . LAMINECTOMY     C5/placement of steel plate  . NECK SURGERY  2003   replaced disk  . RADIOLOGY WITH ANESTHESIA N/A 03/08/2019   Procedure: IR WITH ANESTHESIA;  Surgeon: Luanne Bras, MD;  Location: [facility];  Service: Radiology;  Laterality: N/A;  . TEE WITHOUT CARDIOVERSION N/A 10/15/2015   Procedure: TRANSESOPHAGEAL ECHOCARDIOGRAM (TEE);  Surgeon: Larey Dresser, MD;  Location: [place];  Service: Cardiovascular;  Laterality: N/A;  . TEE WITHOUT CARDIOVERSION N/A 11/24/2015   Procedure: TRANSESOPHAGEAL ECHOCARDIOGRAM (TEE);  Surgeon: Josue Hector, MD;  Location: [place];  Service: Cardiovascular;  Laterality: N/A;  .  XI ROBOTIC ASSISTED COLOSTOMY TAKEDOWN N/A 03/07/2019   Procedure: XI ROBOTIC ASSISTED LOW ANTERIOR RESECTION, RIGID PROCTOSCOPY;  Surgeon: Michael Boston, MD;  Location[facility];  Service: General;  Laterality: N/A;    There were no vitals filed for this visit.  Subjective Assessment - 03/27/19 1023    Patient is accompanied by:  Family member   Wife (they are separated)   Pertinent History  Lt MCA CVA 03/08/19. PMH: A-FIB, CAD, h/o CA    Currently in Pain?  No/denies         [facility] OT Assessment - 03/27/19 1026      Assessment   Medical Diagnosis  L MCA    Referring Provider (OT)  Bruce Burchette    Onset Date/Surgical Date  03/08/19   following surgery on 03/07/19    Hand Dominance  Right      Precautions   Precaution Comments  mechanical soft diet, no driving      Restrictions   Weight Bearing Restrictions  No      Balance Screen   Has the patient fallen in the past 6 months  No      Home  Environment   Bathroom Astronomer    Additional Comments  Pt currently living with separated wife w/ 2 big steps to enter. 13 steps downstairs to TV w/ handrail    Lives With  Alone      Prior Function   Level of Independence  Independent    Vocation  Part time employment    Vocation Requirements  lifting up to 70 lbs I'ly, and assisting w/ 150 lbs, requires full grasping, reading words and numbers, follow directions and routines, process up to 2-3 steps ahead (see job function sheet for more details)       ADL   Eating/Feeding  Independent    Grooming  Independent    Upper Body Bathing  Independent    Lower Body Bathing  Independent    Upper Body Dressing  Independent    Lower Body Dressing  Independent    Banker -  Control and instrumentation engineer  Independent    ADL comments  Difficulty using phone - due to aphasia      IADL   Shopping  Needs to be accompanied on any shopping trip    Light Housekeeping  Performs light daily tasks such as dishwashing, bed making    Meal Prep  Plans, prepares and serves adequate meals independently    [place] on family or friends for transportation    Medication Management  Takes responsibility if medication is prepared in advance in seperate dosage;Has difficulty remembering to take medication    Financial Management  Requires assistance   wife setting up and helping      Mobility   Mobility Status  Independent      Written  Expression   Dominant Hand  Right    Handwriting  100% legible   but noted aphasia     Vision - History   Additional Comments  Pt reports no changes from stroke, but did report Lt eye changes approx 10 months to a year ago. But reports no new changes      Vision Assessment   Eye Alignment  Within Functional Limits    Ocular Range of Motion  Within Functional Limits    Tracking/Visual Pursuits  Able to track stimulus in all quads without  difficulty    Comment  Pt reports decreased ability to adjust Lt eye with light      Cognition   Overall Cognitive Status  Cognition to be further assessed in functional context PRN    Cognition Comments  Pt appears to have more language based impairments w/ expressive aphasia and possibly some milder receptive impairments w/ decreased processing - speech to evaluate next      Sensation   Light Touch  Appears Intact      Coordination   9 Hole Peg Test  Right;Left    Right 9 Hole Peg Test  27.00 sec    Left 9 Hole Peg Test  23.91 sec      Perception   Perception  --   appears WFL's with telling time     ROM / Strength   AROM / PROM / Strength  AROM;Strength      AROM   Overall AROM Comments  BUE AROM WNL's      Strength   Overall Strength Comments  LUE MMT 5/5, RUE MMT 4+/5      Hand Function   Right Hand Grip (lbs)  --   70 lbs   Left Hand Grip (lbs)  65 lbs                           OT Long Term Goals - 03/27/19 1601      OT LONG TERM GOAL #1   Title  Independent with HEP for Rt hand coordination and Rt shoulder strength    Time  4    Period  Weeks    Status  New      OT LONG TERM GOAL #2   Title  Improve coordination Rt hand to 25 sec. or less on 9 hole peg test    Baseline  27 sec    Time  4    Period  Weeks    Status  New      OT LONG TERM GOAL #3   Title  Pt to perform medication management w/ external aids/cues prn I'ly after placed in pillbox by family    Time  4    Period  Weeks    Status   New      OT LONG TERM GOAL #4   Title  Pt to lift up to 80 # BUE's and carrying short distance using proper body mechanics in prep for return to work    Time  4    Period  Weeks    Status  New            Plan - 03/27/19 1552    Clinical Impression Statement  Pt is a 63 y.o. male who presents to outpatient rehab for O.T. evaluation s/p Lt MCA CVA on 03/08/19. Pt presents with slightly decreased coordination Rt hand, and slightly decreased strength in Rt shoulder. Pt's biggest deficit appears to be aphasia (see SLP evaluation).    Occupational performance deficits (Please refer to evaluation for details):  IADL's;Work    Body Structure / Function / Physical Skills  IADL;FMC;Coordination;Strength;UE functional use;Body mechanics;Vision;Mobility    Cognitive Skills  Memory    Rehab Potential  Good    Clinical Decision Making  Limited treatment options, no task modification necessary    Comorbidities Affecting Occupational Performance:  May have comorbidities impacting occupational performance   aphasia   Modification or Assistance to Complete Evaluation   Min-Moderate modification of tasks or  assist with assess necessary to complete eval    OT Frequency  2x / week    OT Duration  4 weeks   plus eval   OT Treatment/Interventions  Therapeutic exercise;Self-care/ADL training;Neuromuscular education;Therapeutic activities;Coping strategies training;Cognitive remediation/compensation;Visual/perceptual remediation/compensation;Patient/family education;Passive range of motion    Plan  coordination HEP, Rt theraband HEP    Consulted and Agree with Plan of Care  Family member/caregiver;Patient    Family Member Consulted  wife (although they are separated)       Patient will benefit from skilled therapeutic intervention in order to improve the following deficits and impairments:   Body Structure / Function / Physical Skills: IADL, [place], Coordination, Strength, UE functional use, Body mechanics,  Vision, Mobility Cognitive Skills: Memory     Visit Diagnosis: Other lack of coordination  Muscle weakness (generalized)  Other symptoms and signs involving cognitive functions following cerebral infarction    Problem List Patient Active Problem List   Diagnosis Date Noted  . Hypokalemia 03/12/2019  . Expressive aphasia 03/10/2019  . Dysphagia 03/10/2019  . Middle cerebral artery embolism, left 03/09/2019  . Diverticular stricture ([place]) 03/07/2019  . Bacteria in urine   . Chronic atrial fibrillation 12/02/2018  . Current use of long term anticoagulation 12/02/2018  . UTI (urinary tract infection) 11/29/2018  . Hyponatremia 11/29/2018  . PAF (paroxysmal atrial fibrillation) ([place]) 11/29/2018  . Diverticulitis of large intestine with abscess 09/23/2018  . Dyslipidemia 12/05/2017  . CAD (coronary artery disease), native coronary artery 12/06/2015  . SOB (shortness of breath)   . NICM (nonischemic cardiomyopathy) ([place])   . Thyroid activity decreased   . Hypothyroidism 08/12/2013  . History of radiation therapy 05/30/2012  . Smokes tobacco daily 05/30/2012  . Cancer of tonsillar fossa ([place]) 11/27/2011  . Tonsil cancer ([place]) 2005  . History of chemotherapy 2005    Carey Bullocks, OTR/L 03/27/2019, 4:08 PM  [facility] [address] [place], [place], [address] Phone: [phone]   Fax:  [phone]  Name: ABDO DENAULT MRN: [id number] Date of Birth: 09-05-1955

## 2019-03-27 NOTE — Patient Instructions (Signed)
Swallowing: I recommend continuing the diet recommended in the hospital: Puree and nectar by teaspoon, keep turning your head and swallowing multiple times. Clear your throat when it sounds wet/gurgly. It will take time to rehab the muscles of your swallowing, particularly because your radiation treatment may be contributing.  Oral care: after each meal  Signs of Aspiration Pneumonia   . Chest pain/tightness . Fever (can be low grade) . Cough  o With foul-smelling phlegm (sputum) o With sputum containing pus or blood o With greenish sputum . Fatigue  . Shortness of breath  . Wheezing   **IF YOU HAVE THESE SIGNS, CONTACT YOUR DOCTOR OR GO TO THE EMERGENCY DEPARTMENT OR URGENT CARE AS SOON AS POSSIBLE**    Communication  Try not to hold back from interacting with your friends and family. Spend some time chatting with or [place] people you feel comfortable with.  Tips for Talking with People who have Aphasia  . Say one thing at a time . Don't  rush - slow down, be patient . Talk face to face . Reduce background noise . Relax - be natural . Use pen and paper . Write down key words . Draw diagrams or pictures . Don't pretend you understand . Ask what helps . Recap - check you both understand . Be a partner, not a therapist   Aphasia does not affect intelligence, only language. The person with aphasia can still: make decisions, have opinions, and socialize.   Describing words  What group does it belong to?  What do I use it for?  Where can I find it?  What does it LOOK like?  What other words go with it?  What is the 1st sound of the word?   Many Ways to Communicate  Describe it Write it Draw it Gesture it Use related words  There's an App for that: Family Feud, Heads up, Stop-fun categories, What if, [employer]

## 2019-03-29 NOTE — Therapy (Signed)
[facility] [address] [place], [place], [address] Phone: [phone]   Fax:  [phone]  Speech Language Pathology Evaluation  Patient Details  Name: Adrian [name] MRN: [id number] Date of Birth: 1956-05-02 Referring Provider (SLP): [name], MD (PCP) referred to therapy by NP at hospital   Encounter Date: 03/27/2019  End of Session - 03/27/19 1147   Visit Number  1    Number of Visits  17    Date for SLP Re-Evaluation  05/24/19    Authorization Type  BCBS    SLP Start Time  1147    SLP Stop Time   1240    SLP Time Calculation (min)  53 min    Activity Tolerance  Patient tolerated treatment well       Past Medical History:  Diagnosis Date  . AF (paroxysmal atrial fibrillation) ([place]) 11/29/2018  . Atrial flutter ([place]) 10/12/2015  . CAD (coronary artery disease), native coronary artery 12/06/2015   a. 10/2015 MV: EF  37%, reversible defect inferior apex, intermediate risk findings; b. 10/2015 Cath: 20% mid RCA.  . Colonic diverticular abscess   . Diverticulitis   . History of chemotherapy 2005   Cisplatin  . Hypothyroidism   . NICM (nonischemic cardiomyopathy) ([place]) 10/14/2015   a. Tachy mediated?;  b. Echo 3/17 - Mild concentric LVH, EF 30-35%, anteroseptal, anterior, anterolateral, apical anterior, lateral hypokinesis, trivial MR, mild to moderately reduced RVSF; c. LHC 3/17 - mRCA 20%  . Paroxysmal atrial flutter ([place])    a. TEE 3/17 with ? LAA clot-->s/p TEE/DCCV 11/24/2015  . Radiation NOv.3,2005-Dec. 15, 2005   6810 cGy in 30 fractions  . Tonsil cancer [facility]) 2005   Dr Romeo Rabon.  XRT    Past Surgical History:  Procedure Laterality Date  . APPENDECTOMY N/A 03/07/2019   Procedure: ROBOTIC ASSISTED APPENDECTOMY;  Surgeon: Michael Boston, MD;  Location: [facility];  Service: General;  Laterality: N/A;  . CARDIAC CATHETERIZATION N/A 10/18/2015   Procedure: Left Heart Cath and Coronary Angiography;  Surgeon:  Burnell Blanks, MD;  Location: [facility];  Service: Cardiovascular;  Laterality: N/A;  . CARDIOVERSION N/A 11/24/2015   Procedure: CARDIOVERSION;  Surgeon: Josue Hector, MD;  Location: [facility];  Service: Cardiovascular;  Laterality: N/A;  . CYSTOSCOPY WITH STENT PLACEMENT Bilateral 03/07/2019   Procedure: CYSTOSCOPY WITH BILATERAL FIREFLY INJECTION;  Surgeon: Ardis Hughs, MD;  Location: [facility];  Service: Urology;  Laterality: Bilateral;  . GASTROSTOMY TUBE PLACEMENT  07/04/2004   IR - G tube for tonsilar cancer  . IR ANGIO INTRA EXTRACRAN SEL COM CAROTID INNOMINATE UNI R MOD SED  03/09/2019  . IR ANGIO VERTEBRAL SEL VERTEBRAL UNI R MOD SED  03/09/2019  . IR CT HEAD LTD  03/09/2019  . IR PERCUTANEOUS ART THROMBECTOMY/INFUSION INTRACRANIAL INC DIAG ANGIO  03/09/2019  . IR RADIOLOGIST EVAL & MGMT  12/18/2018  . LAMINECTOMY     C5/placement of steel plate  . NECK SURGERY  2003   replaced disk  . RADIOLOGY WITH ANESTHESIA N/A 03/08/2019   Procedure: IR WITH ANESTHESIA;  Surgeon: Luanne Bras, MD;  Location: [facility];  Service: Radiology;  Laterality: N/A;  . TEE WITHOUT CARDIOVERSION N/A 10/15/2015   Procedure: TRANSESOPHAGEAL ECHOCARDIOGRAM (TEE);  Surgeon: Larey Dresser, MD;  Location: [facility];  Service: Cardiovascular;  Laterality: N/A;  . TEE WITHOUT CARDIOVERSION N/A 11/24/2015   Procedure: TRANSESOPHAGEAL ECHOCARDIOGRAM (TEE);  Surgeon: Josue Hector, MD;  Location: [facility]  ENDOSCOPY;  Service: Cardiovascular;  Laterality: N/A;  . XI ROBOTIC ASSISTED COLOSTOMY TAKEDOWN N/A 03/07/2019   Procedure: XI ROBOTIC ASSISTED LOW ANTERIOR RESECTION, RIGID PROCTOSCOPY;  Surgeon: Michael Boston, MD;  Location: [facility];  Service: General;  Laterality: N/A;    There were no vitals filed for this visit.  Subjective Assessment - 03/27/19 1147   Subjective  "We...wanted what the... and this is third. The third, you, is being my my protractive self."    Patient is accompained by:   Family member   ex-wife   Currently in Pain?  No/denies         SLP Evaluation [facility] - 03/27/19 1147     SLP Visit Information   SLP Received On  03/27/19    Referring Provider (SLP)  [name], MD (PCP) referred to therapy by NP at hospital    Onset Date  03/07/19    Medical Diagnosis  L MCA CVA      Subjective   Subjective  wet vocal quality    Patient/Family Stated Goal  return to work, improve swallowing      General Information   HPI  Pt with L MCA CVA after bowel surgery on 7/31, tonsil cancer with radiation and chemotherapy in 2005, cardiomyopathy, atrial flutter, hypothyroidism, diverticulitis with abcess, CAD, and C5 laminectomy     Behavioral/Cognition  alert, cooperative, aphasic    Mobility Status  ambulated to session      Balance Screen   Has the patient fallen in the past 6 months  No      Prior Functional Status   Cognitive/Linguistic Baseline  Within functional limits    Type of Home  House     Lives With  --   pt and wife are separated, staying with her currently   Available Support  Family    Education  2 yrs college    Vocation  Part time employment   [employer]     Pain Assessment   Pain Assessment  No/denies pain    Pain Score  0-No pain      Cognition   Overall Cognitive Status  Difficult to assess   due to aphasia; suspect deficits, will assess further   Difficult to assess due to  Impaired communication      Auditory Comprehension   Overall Auditory Comprehension  Impaired   complex   Yes/No Questions  Within Functional Limits    Commands  Impaired    Complex Commands  --   sequential commands 70/80   Conversation  Moderately complex    EffectiveTechniques  Repetition   rephrasing     Visual Recognition/Discrimination   Discrimination  Within Function Limits      Reading Comprehension   Reading Status  Not tested      Expression   Primary Mode of Expression  Verbal      Verbal Expression   Overall Verbal Expression  Impaired     Initiation  No impairment    Automatic Speech  Name;Social Response    Level of Generative/Spontaneous Verbalization  Conversation    Repetition  Impaired    Level of Impairment  Sentence level   min impairment   Naming  Impairment    Responsive  51-75% accurate   70%   Confrontation  --   52/60, many instances of dysnomia, pt s/c 3/4   Divergent  Other (comment)   6 animals in 60 seconds, perseveration x2   Verbal Errors  Semantic paraphasias;Phonemic paraphasias;Confabulation;Perseveration  Pragmatics  No impairment      Written Expression   Dominant Hand  Right    Written Expression  Not tested   wife showed SLP a text from pt which was incomprehensible   Overall Writen Expression  will formally assess       Oral Motor/Sensory Function   Overall Oral Motor/Sensory Function  Other (comment)   deferred due to clinic masking procedure     Motor Speech   Overall Motor Speech  Impaired    Respiration  Within functional limits    Phonation  Normal    Resonance  Within functional limits    Articulation  Impaired    Level of Impairment  Conversation    Intelligibility  Intelligible    Motor Planning  Not tested   will assess further     Standardized Assessments   Standardized Assessments   Western Aphasia Battery revised;Other Assessment    Western Aphasia Battery revised   Aphasia quotient 85.9 (mild), Spontaneous speech 17/20, Auditory verbal comprehension 9.45/10, repetition 9.2/10, naming and wordfinding 7.3/10, aphasia classification: anomic    Other Assessment  Pt reports advancing to thin liquids and some mechanical soft foods. Vocal quality was wet today and pt was found to be at moderate-severe risk for aspiration per MBS in hospital ~2 weeks ago. SLP reinforced swallow precautions and advised continuing nectar/puree at this time; will need swallow HEP and likely repeat MBS in 3-6 weeks           SLP Education - 03/27/19 1147   Education Details  Swallowing  precautions and recommendations: nectar and puree by teaspoon, turn head, clear throat    Person(s) Educated  Patient;Spouse    Methods  Explanation;Handout    Comprehension  Verbalized understanding;Need further instruction       SLP Short Term Goals - 03/27/19 1147     SLP SHORT TERM GOAL #1   Title  Pt will demo HEP for dysphagia with rare min A x 3 sessions.    Time  4    Period  Weeks    Status  New      SLP SHORT TERM GOAL #2   Title  Pt will complete further assessment of cognition and writing and goals added as appropriate, within first 4 visits.    Time  2    Period  Weeks    Status  New      SLP SHORT TERM GOAL #3   Title  Pt will utitlize multimodal communication/compensations for anomia in 8 minutes simple-mod complex conversation over 3 sessions with occasional min A    Time  4    Period  Weeks    Status  New       SLP Long Term Goals - 03/27/19 1147     SLP LONG TERM GOAL #1   Title  Pt will complete HEP for dysphagia with modified independence x 3 sessions.    Time  8    Period  Weeks    Status  New      SLP LONG TERM GOAL #2   Title  Pt will participate in repeat MBS for objective assessment of swallow function to determine readiness for diet advancement.    Time  8    Period  Weeks    Status  New      SLP LONG TERM GOAL #3   Title  Pt will utitlize multimodal communication/compensations for anomia in 12 minutes mod complex conversation over 3 sessions with  occasional min A    Time  8    Period  Weeks    Status  New       Plan - 03/27/19 1147    Clinical Impression Statement  Mr. Hoffecker presents with mild anomic aphasia, dysarthria and oropharyngeal dysphagia, with MBS 03/13/19 recommendations for dys 1/nectar via teaspoon with head turn right, multiple swallows due to pharyngeal residue. Of note, pt also has a history of tonsilar cancer with XRT in 2005. SLP suspects deficits in written expression as well as cognition; these will be formally assessed  in next 2-3 visits. Pt with word-finding difficulties, dysnomia, and circumlocutory speech in conversation. Complex auditory comprehension impaired. He reports frustration with his difficulties sending text messages. Pt works part time for [employer] and is eager to return to work. Pt hopeful to return mid-September, although SLP anticipates pt likely to need additional time, depending on severity of cognitive deficits. SLP reinforced swallowing precautions and aspiration risks with pt today; will establish HEP for dysphagia and will need repeat MBS in next 3-6 weeks. I recommend skilled ST to address communication and swallowing impairments, in order to maximize pt's independence and safety, increase his ability to communicate wants and needs, and improve quality of life.    Speech Therapy Frequency  2x / week    Duration  --   8 weeks or 17 visits   Treatment/Interventions  Aspiration precaution training;Diet toleration management by SLP;Environmental controls;Trials of upgraded texture/liquids;Cognitive reorganization;Oral motor exercises;Multimodal communcation approach;Compensatory strategies;Pharyngeal strengthening exercises;Language facilitation;Compensatory techniques;Cueing hierarchy;Internal/external aids;Functional tasks;SLP instruction and feedback;Patient/family education   MBS   Potential to Achieve Goals  Good    SLP Home Exercise Plan  Swallowing precautions and diet recommendations provided; needs dysphagia HEP next session    Consulted and Agree with Plan of Care  Patient;Family member/caregiver       Patient will benefit from skilled therapeutic intervention in order to improve the following deficits and impairments:   Dysphagia, oropharyngeal phase  Aphasia  Cognitive communication deficit  Dysarthria and anarthria    Problem List Patient Active Problem List   Diagnosis Date Noted  . Hypokalemia 03/12/2019  . Expressive aphasia 03/10/2019  . Dysphagia 03/10/2019  . Middle  cerebral artery embolism, left 03/09/2019  . Diverticular stricture ([place]) 03/07/2019  . Bacteria in urine   . Chronic atrial fibrillation 12/02/2018  . Current use of long term anticoagulation 12/02/2018  . UTI (urinary tract infection) 11/29/2018  . Hyponatremia 11/29/2018  . PAF (paroxysmal atrial fibrillation) ([place]) 11/29/2018  . Diverticulitis of large intestine with abscess 09/23/2018  . Dyslipidemia 12/05/2017  . CAD (coronary artery disease), native coronary artery 12/06/2015  . SOB (shortness of breath)   . NICM (nonischemic cardiomyopathy) ([place])   . Thyroid activity decreased   . Hypothyroidism 08/12/2013  . History of radiation therapy 05/30/2012  . Smokes tobacco daily 05/30/2012  . Cancer of tonsillar fossa ([place]) 11/27/2011  . Tonsil cancer ([place]) 2005  . History of chemotherapy 2005   Deneise Lever, MS, CCC-SLP Speech-Language Pathologist  Aliene Altes 03/29/2019, 4:17 PM  [facility] [address] [place], [place], [address] Phone: [phone]   Fax:  [phone]  Name: Adrian [name] MRN: [id number] Date of Birth: 1956/04/10

## 2019-03-31 ENCOUNTER — Telehealth: Payer: Self-pay | Admitting: Family Medicine

## 2019-03-31 ENCOUNTER — Telehealth: Payer: Self-pay | Admitting: Cardiology

## 2019-03-31 NOTE — Telephone Encounter (Signed)
Medication Refill - Medication: levothyroxine (SYNTHROID) 125 MCG tablet  Pt's ex wife Archie Patten called to report that pt misplaced supply and needs a new prescription called in. Please advise. 90 day supply request   Has the patient contacted their pharmacy? Yes.   (Agent: If no, request that the patient contact the pharmacy for the refill.) (Agent: If yes, when and what did the pharmacy advise?)  Preferred Pharmacy (with phone number or street name):  [facility], [place] [address] [place] [address]  Phone: [phone] Fax: [phone]     Agent: Please be advised that RX refills may take up to 3 business days. We ask that you follow-up with your pharmacy.

## 2019-03-31 NOTE — Telephone Encounter (Signed)
Called patients ex wife Archie Patten and she let me know that he has had a lot of changes and I advised to her that he needs his TSH checked and possibly some other labs and an update for his eye problem. [name] set up a Doxy visit tomorrow at 3:30pm. They want to have labs done at [facility] med center since this is closer to them. Sending as an Pharmacist, hospital

## 2019-03-31 NOTE — Telephone Encounter (Signed)
I spoke to the patient who has been on Bystolic 10 mg daily for quite awhile.  Recently, he has noticed a decline in his BP and was wondering if Dr [name] would want to adjust the dosage.  On 8/15 (132/69 hr 78) 8/16 (127/71 hr 64) 8/17 (147/63 hr 65).    On both 8/18 and 8/19, his BP was between 80-90/50s hr 60s.  He stopped taking the Bystolic 4 days ago and today 8/24, his BP was 92/59 HR 72.   He is asymptomatic, but was wondering about an adjustment. Please advise, thank you.

## 2019-03-31 NOTE — Telephone Encounter (Signed)
New message:  Pt c/o medication issue:  1. Name of Medication: nebivolol (BYSTOLIC) 10 MG tablet  2. How are you currently taking this medication (dosage and times per day)? Not currently taking  3. Are you having a reaction (difficulty breathing--STAT)? no  4. What is your medication issue? Patient is not taking the medication right now, as his bp has been low. Archie Patten, ex-wife, called and wanted to make Dr. [name] aware that the patient has not had this medication for the past 5 days.    Archie Patten was told in the past that if the BP/ pulse were low to either only take a half a pill or to eliminate the medicine completely. The pt and Archie Patten both went to a follow-up appt at a surgeon's office and the surgeon gave similar instructions for the medication, but Archie Patten wanted to confirm the details with Dr. [name].    Pt c/o BP issue: STAT if pt c/o blurred vision, one-sided weakness or slurred speech  1. What are your last 5 BP readings?  90/50 HR 52 (below 60)  2. Are you having any other symptoms (ex. Dizziness, headache, blurred vision, passed out)? no  3. What is your BP issue? Low bp   Archie Patten has more readings at home, but she is currently at work. She will be checking in on the patient later today. If the patient can not be reached, please contact [facility] at [phone] and leave a message.

## 2019-03-31 NOTE — Telephone Encounter (Signed)
I spoke to the patient with Dr Turner's recommendations.  He verbalized understanding. 

## 2019-03-31 NOTE — Telephone Encounter (Signed)
Ok to stay off Bystolic.  Please have him come in for EKG to make sure he is not back in aflutter/afib.  Continue to followup BP for the next week and call with results.

## 2019-03-31 NOTE — Telephone Encounter (Signed)
Pt is having vision problems, referral request for opthalmology. For about a year Left eye, going from dark into sunlight has to squint. Best contact: [phone]

## 2019-04-01 ENCOUNTER — Other Ambulatory Visit: Payer: Self-pay

## 2019-04-01 ENCOUNTER — Telehealth (INDEPENDENT_AMBULATORY_CARE_PROVIDER_SITE_OTHER): Payer: BC Managed Care – PPO | Admitting: Family Medicine

## 2019-04-01 DIAGNOSIS — D649 Anemia, unspecified: Secondary | ICD-10-CM

## 2019-04-01 DIAGNOSIS — H538 Other visual disturbances: Secondary | ICD-10-CM

## 2019-04-01 DIAGNOSIS — M6281 Muscle weakness (generalized): Secondary | ICD-10-CM | POA: Diagnosis not present

## 2019-04-01 DIAGNOSIS — E039 Hypothyroidism, unspecified: Secondary | ICD-10-CM | POA: Diagnosis not present

## 2019-04-01 NOTE — Progress Notes (Signed)
This visit type was conducted due to national recommendations for restrictions regarding the COVID-19 pandemic in an effort to limit this patient's exposure and mitigate transmission in our community.   Virtual Visit via Video Note  I connected with Adrian [name] on 04/01/19 at  3:30 PM EDT by a video enabled telemedicine application and verified that I am speaking with the correct person using two identifiers.  Location patient: home Location provider:work or home office Persons participating in the virtual visit: patient, provider  I discussed the limitations of evaluation and management by telemedicine and the availability of in person appointments. The patient expressed understanding and agreed to proceed.   HPI: Patient has had multiple recent complicating medical issues and is seen today needing refills of thyroid medication and for several other issues as below.  Recent medical history is summarized below  He has had recurrent diverticulitis.  He went in just several weeks ago for robotic assisted low anterior resection which went well.  He then developed expressive aphasia and was determined to have left middle cerebral artery embolic stroke and has been followed by neurology since then.  He had been taken off anticoagulation prior to surgery.  He is now back on anticoagulation.  His expressive aphasia is improving some.  Is been on dysphagia diet and continues to have some dysphagia symptoms.  He has multiple other chronic problems including history of CAD, hypothyroidism, remote history of tongue cancer, smoking history, nonischemic cardiomyopathy, and history of right common femoral artery pseudoaneurysm.  He needs follow-up thyroid functions.  Also will need refills of thyroid medication soon.  His wife is requesting 25 hydroxy vitamin D level.  He has not had any recent falls but has had some muscle fatigue.  Does not go outdoors much.  He also complains of visual disturbance.  He  states that when he goes from a dark room to light room can take several minutes usually 5 to 45 minutes for his left eye to accommodate.  This is been going on he states for about 10 months.  He has insomnia issues.  Cannot fall asleep or stay asleep.  Avoiding caffeine at night.   ROS: See pertinent positives and negatives per HPI.  Past Medical History:  Diagnosis Date  . AF (paroxysmal atrial fibrillation) ([place]) 11/29/2018  . Atrial flutter ([place]) 10/12/2015  . CAD (coronary artery disease), native coronary artery 12/06/2015   a. 10/2015 MV: EF  37%, reversible defect inferior apex, intermediate risk findings; b. 10/2015 Cath: 20% mid RCA.  . Colonic diverticular abscess   . Diverticulitis   . History of chemotherapy 2005   Cisplatin  . Hypothyroidism   . NICM (nonischemic cardiomyopathy) ([place]) 10/14/2015   a. Tachy mediated?;  b. Echo 3/17 - Mild concentric LVH, EF 30-35%, anteroseptal, anterior, anterolateral, apical anterior, lateral hypokinesis, trivial MR, mild to moderately reduced RVSF; c. LHC 3/17 - mRCA 20%  . Paroxysmal atrial flutter ([place])    a. TEE 3/17 with ? LAA clot-->s/p TEE/DCCV 11/24/2015  . Radiation NOv.3,2005-Dec. 15, 2005   6810 cGy in 30 fractions  . Tonsil cancer [facility]) 2005   Dr [place] Rabon.  XRT    Past Surgical History:  Procedure Laterality Date  . APPENDECTOMY N/A 03/07/2019   Procedure: ROBOTIC ASSISTED APPENDECTOMY;  Surgeon: Michael Boston, MD;  [facility];  Service: General;  Laterality: N/A;  . CARDIAC CATHETERIZATION N/A 10/18/2015   Procedure: Left Heart Cath and Coronary Angiography;  Surgeon: Burnell Blanks, MD;  Location: [facility]  INVASIVE CV LAB;  Service: Cardiovascular;  Laterality: N/A;  . CARDIOVERSION N/A 11/24/2015   Procedure: CARDIOVERSION;  Surgeon: Josue Hector, MD;  Location: [place];  Service: Cardiovascular;  Laterality: N/A;  . CYSTOSCOPY WITH STENT PLACEMENT Bilateral 03/07/2019   Procedure: CYSTOSCOPY WITH BILATERAL FIREFLY  INJECTION;  Surgeon: Ardis Hughs, MD;  [facility];  Service: Urology;  Laterality: Bilateral;  . GASTROSTOMY TUBE PLACEMENT  07/04/2004   IR - G tube for tonsilar cancer  . IR ANGIO INTRA EXTRACRAN SEL COM CAROTID INNOMINATE UNI R MOD SED  03/09/2019  . IR ANGIO VERTEBRAL SEL VERTEBRAL UNI R MOD SED  03/09/2019  . IR CT HEAD LTD  03/09/2019  . IR PERCUTANEOUS ART THROMBECTOMY/INFUSION INTRACRANIAL INC DIAG ANGIO  03/09/2019  . IR RADIOLOGIST EVAL & MGMT  12/18/2018  . LAMINECTOMY     C5/placement of steel plate  . NECK SURGERY  2003   replaced disk  . RADIOLOGY WITH ANESTHESIA N/A 03/08/2019   Procedure: IR WITH ANESTHESIA;  Surgeon: Luanne Bras, MD;  Location: [place];  Service: Radiology;  Laterality: N/A;  . TEE WITHOUT CARDIOVERSION N/A 10/15/2015   Procedure: TRANSESOPHAGEAL ECHOCARDIOGRAM (TEE);  Surgeon: Larey Dresser, MD;  Location: [place];  Service: Cardiovascular;  Laterality: N/A;  . TEE WITHOUT CARDIOVERSION N/A 11/24/2015   Procedure: TRANSESOPHAGEAL ECHOCARDIOGRAM (TEE);  Surgeon: Josue Hector, MD;  Location: [facility] ENDOSCOPY;  Service: Cardiovascular;  Laterality: N/A;  . XI ROBOTIC ASSISTED COLOSTOMY TAKEDOWN N/A 03/07/2019   Procedure: XI ROBOTIC ASSISTED LOW ANTERIOR RESECTION, RIGID PROCTOSCOPY;  Surgeon: Michael Boston, MD;  [facility];  Service: General;  Laterality: N/A;    Family History  Problem Relation Age of Onset  . Prostate cancer Father   . Colon cancer Father   . Skin cancer Mother   . Cancer Brother   . Esophageal cancer Neg Hx   . Rectal cancer Neg Hx   . Stomach cancer Neg Hx     SOCIAL HX: Long history of tobacco use   Current Outpatient Medications:  .  acetaminophen (TYLENOL) 325 MG tablet, Take 2 tablets (650 mg total) by mouth every 6 (six) hours as needed for mild pain (or Fever >/= 101)., Disp: , Rfl:  .  apixaban (ELIQUIS) 5 MG TABS tablet, Take 1 tablet (5 mg total) by mouth 2 (two) times daily., Disp: 180 tablet, Rfl:  1 .  atorvastatin (LIPITOR) 40 MG tablet, Take 1 tablet (40 mg total) by mouth daily., Disp: 90 tablet, Rfl: 3 .  food thickener (RESOURCE THICKENUP CLEAR) POWD, Take 120 g by mouth as needed (Follow package instructions for nectar-thick liquids). (Patient not taking: Reported on 03/27/2019), Disp: 30 Can, Rfl: 3 .  levothyroxine (SYNTHROID) 125 MCG tablet, TAKE 1 TABLET BY MOUTH EVERY DAY, Disp: 90 tablet, Rfl: 0 .  Multiple Vitamin (MULTIVITAMIN WITH MINERALS) TABS tablet, Take 1 tablet by mouth daily. (Patient not taking: Reported on 03/27/2019), Disp: , Rfl:  .  nicotine (NICODERM CQ) 14 mg/24hr patch, After 21mg  patches are complete, apply 14mg  patch one daily for 2 weeks. Remove old patch before applying new one., Disp: 14 patch, Rfl: 0 .  nicotine (NICODERM CQ) 21 mg/24hr patch, Apply 21mg  patch one daily for 6 weeks, then change to 14mg  patch. Remove old patch before applying new one., Disp: 42 patch, Rfl: 0 .  nicotine (NICODERM CQ) 7 mg/24hr patch, After 14mg  patch complete, apply 7mg  patch one daily for 2 weeks. Remove old patch before applying  new one., Disp: 14 patch, Rfl: 0  EXAM:  VITALS per patient if applicable:  GENERAL: alert, oriented, appears well and in no acute distress  HEENT: atraumatic, conjunttiva clear, no obvious abnormalities on inspection of external nose and ears  NECK: normal movements of the head and neck  LUNGS: on inspection no signs of respiratory distress, breathing rate appears normal, no obvious gross SOB, gasping or wheezing  CV: no obvious cyanosis  MS: moves all visible extremities without noticeable abnormality  PSYCH/NEURO: pleasant and cooperative, no obvious depression or anxiety, speech and thought processing grossly intact  ASSESSMENT AND PLAN:  Discussed the following assessment and plan:  #1 hypothyroidism -Future lab order placed for this Friday for TSH -If normal range refill his thyroid medication for [facility] for 1 year  #2  intermittent left eye blurred vision symptoms -Set up ophthalmology referral  #3 insomnia -Recommend good sleep hygiene.  Consider trial of melatonin 3 mg nightly  #4 history of atrial fibrillation on chronic anticoagulation  #5 recent embolic left MCA stroke with expressive aphasia  #6 recent normocytic anemia following surgery -Patient requesting follow-up CBC and this was ordered for future lab     I discussed the assessment and treatment plan with the patient. The patient was provided an opportunity to ask questions and all were answered. The patient agreed with the plan and demonstrated an understanding of the instructions.   The patient was advised to call back or seek an in-person evaluation if the symptoms worsen or if the condition fails to improve as anticipated.   Carolann Littler, MD

## 2019-04-02 ENCOUNTER — Other Ambulatory Visit: Payer: Self-pay

## 2019-04-02 ENCOUNTER — Encounter: Payer: Self-pay | Admitting: Physical Therapy

## 2019-04-02 ENCOUNTER — Ambulatory Visit: Payer: BC Managed Care – PPO | Admitting: Physical Therapy

## 2019-04-02 ENCOUNTER — Ambulatory Visit: Payer: BC Managed Care – PPO

## 2019-04-02 DIAGNOSIS — M6281 Muscle weakness (generalized): Secondary | ICD-10-CM

## 2019-04-02 DIAGNOSIS — R41841 Cognitive communication deficit: Secondary | ICD-10-CM

## 2019-04-02 DIAGNOSIS — R1312 Dysphagia, oropharyngeal phase: Secondary | ICD-10-CM

## 2019-04-02 DIAGNOSIS — R4701 Aphasia: Secondary | ICD-10-CM

## 2019-04-02 DIAGNOSIS — R471 Dysarthria and anarthria: Secondary | ICD-10-CM

## 2019-04-02 DIAGNOSIS — R2681 Unsteadiness on feet: Secondary | ICD-10-CM

## 2019-04-02 NOTE — Therapy (Signed)
[facility] [address] [place], [place], [address] Phone: [phone]   Fax:  [phone]  Speech Language Pathology Treatment  Patient Details  Name: Adrian [name] MRN: [id number] Date of Birth: February 18, 1956 Referring Provider (SLP): [name], MD (PCP) referred to therapy by NP at hospital   Encounter Date: 04/02/2019  End of Session - 04/02/19 1735    Visit Number  2    Number of Visits  17    Date for SLP Re-Evaluation  05/24/19    Authorization Type  BCBS    SLP Start Time  0850    SLP Stop Time   0930    SLP Time Calculation (min)  40 min    Activity Tolerance  Patient tolerated treatment well       Past Medical History:  Diagnosis Date  . AF (paroxysmal atrial fibrillation) ([place]) 11/29/2018  . Atrial flutter ([place]) 10/12/2015  . CAD (coronary artery disease), native coronary artery 12/06/2015   a. 10/2015 MV: EF  37%, reversible defect inferior apex, intermediate risk findings; b. 10/2015 Cath: 20% mid RCA.  . Colonic diverticular abscess   . Diverticulitis   . History of chemotherapy 2005   Cisplatin  . Hypothyroidism   . NICM (nonischemic cardiomyopathy) ([place]) 10/14/2015   a. Tachy mediated?;  b. Echo 3/17 - Mild concentric LVH, EF 30-35%, anteroseptal, anterior, anterolateral, apical anterior, lateral hypokinesis, trivial MR, mild to moderately reduced RVSF; c. LHC 3/17 - mRCA 20%  . Paroxysmal atrial flutter ([place])    a. TEE 3/17 with ? LAA clot-->s/p TEE/DCCV 11/24/2015  . Radiation NOv.3,2005-Dec. 15, 2005   6810 cGy in 30 fractions  . Tonsil cancer [facility]) 2005   Dr [place] Rabon.  XRT    Past Surgical History:  Procedure Laterality Date  . APPENDECTOMY N/A 03/07/2019   Procedure: ROBOTIC ASSISTED APPENDECTOMY;  Surgeon: Michael Boston, MD;  [facility];  Service: General;  Laterality: N/A;  . CARDIAC CATHETERIZATION N/A 10/18/2015   Procedure: Left Heart Cath and Coronary Angiography;  Surgeon:  Burnell Blanks, MD;  Location: [place] CV LAB;  Service: Cardiovascular;  Laterality: N/A;  . CARDIOVERSION N/A 11/24/2015   Procedure: CARDIOVERSION;  Surgeon: Josue Hector, MD;  Location: [place];  Service: Cardiovascular;  Laterality: N/A;  . CYSTOSCOPY WITH STENT PLACEMENT Bilateral 03/07/2019   Procedure: CYSTOSCOPY WITH BILATERAL FIREFLY INJECTION;  Surgeon: Ardis Hughs, MD;  [facility];  Service: Urology;  Laterality: Bilateral;  . GASTROSTOMY TUBE PLACEMENT  07/04/2004   IR - G tube for tonsilar cancer  . IR ANGIO INTRA EXTRACRAN SEL COM CAROTID INNOMINATE UNI R MOD SED  03/09/2019  . IR ANGIO VERTEBRAL SEL VERTEBRAL UNI R MOD SED  03/09/2019  . IR CT HEAD LTD  03/09/2019  . IR PERCUTANEOUS ART THROMBECTOMY/INFUSION INTRACRANIAL INC DIAG ANGIO  03/09/2019  . IR RADIOLOGIST EVAL & MGMT  12/18/2018  . LAMINECTOMY     C5/placement of steel plate  . NECK SURGERY  2003   replaced disk  . RADIOLOGY WITH ANESTHESIA N/A 03/08/2019   Procedure: IR WITH ANESTHESIA;  Surgeon: Luanne Bras, MD;  Location: [place];  Service: Radiology;  Laterality: N/A;  . TEE WITHOUT CARDIOVERSION N/A 10/15/2015   Procedure: TRANSESOPHAGEAL ECHOCARDIOGRAM (TEE);  Surgeon: Larey Dresser, MD;  Location: [place];  Service: Cardiovascular;  Laterality: N/A;  . TEE WITHOUT CARDIOVERSION N/A 11/24/2015   Procedure: TRANSESOPHAGEAL ECHOCARDIOGRAM (TEE);  Surgeon: Josue Hector, MD;  Location:  [facility];  Service: Cardiovascular;  Laterality: N/A;  . XI ROBOTIC ASSISTED COLOSTOMY TAKEDOWN N/A 03/07/2019   Procedure: XI ROBOTIC ASSISTED LOW ANTERIOR RESECTION, RIGID PROCTOSCOPY;  Surgeon: Michael Boston, MD;  [facility];  Service: General;  Laterality: N/A;    There were no vitals filed for this visit.  Subjective Assessment - 04/02/19 0859    Subjective  "I am now on um...soft database. Soft food however, .Marland KitchenMarland KitchenI am -- have eaten some hamburger -- what is it? Helper? (wife:  "Meatloaf"). Pt with clear voice today, not wet.    Patient is accompained by:  Family member   wife           ADULT SLP TREATMENT - 04/02/19 0851      General Information   Behavior/Cognition  Alert;Cooperative;Pleasant mood      Treatment Provided   Treatment provided  Dysphagia      Dysphagia Treatment   Temperature Spikes Noted  No    Respiratory Status  Room air    Patient observed directly with PO's  Yes    Other treatment/comments  Pt no longer adhering to teaspoons nectar. Pt has also had dysIII salads which ex-wife states was without overt s/sx aspiration. SLP assessed pt with American cheese and pt without overt s/sx aspiration. Req'd occasional cues for throat clear/cough after liquid swallows, and usual cues for reswallow after throat clear or ocugh with liquids. SLP told pt to cont with his consistency liquids (runny nectar) with close and special attention to cough/throat clear after each liquid swallow with multiple swallows. Education today about overt s/sx aspiration PNA. SLP reiterated pt only sensed penetrated matereial approx half of the time so cough/throat clear was crucial for pulmonary health. Pt to bring chicken or tuna salad next session for SLP assessment.      Assessment / Recommendations / Plan   Plan  Continue with current plan of care      Dysphagia Recommendations   Diet recommendations  Nectar-thick liquid;Dysphagia 2 (fine chop);Dysphagia 1 (puree)   "runny nectar"   Liquids provided via  Cup    Medication Administration  Whole meds with puree    Compensations  Slow rate;Small sips/bites;Multiple dry swallows after each bite/sip;Clear throat intermittently;Hard cough after swallow   clear/cough with liquids   Postural Changes and/or Swallow Maneuvers  Head turn right during swallow      Progression Toward Goals   Progression toward goals  Progressing toward goals       SLP Education - 04/02/19 1734    Education Details  swallow precautions     Person(s) Educated  Patient;Spouse    Methods  Explanation;Verbal cues    Comprehension  Verbalized understanding;Verbal cues required       SLP Short Term Goals - 04/02/19 1737      SLP SHORT TERM GOAL #1   Title  Pt will demo HEP for dysphagia with rare min A x 3 sessions.    Time  4    Period  Weeks    Status  On-going      SLP SHORT TERM GOAL #2   Title  Pt will complete further assessment of cognition and writing and goals added as appropriate, within first 4 visits.    Time  2    Period  Weeks    Status  On-going      SLP SHORT TERM GOAL #3   Title  Pt will utitlize multimodal communication/compensations for anomia in 8 minutes simple-mod complex conversation over  3 sessions with occasional min A    Time  4    Period  Weeks    Status  On-going       SLP Long Term Goals - 04/02/19 1737      SLP LONG TERM GOAL #1   Title  Pt will complete HEP for dysphagia with modified independence x 3 sessions.    Time  8    Period  Weeks    Status  On-going      SLP LONG TERM GOAL #2   Title  Pt will participate in repeat MBS for objective assessment of swallow function to determine readiness for diet advancement.    Time  8    Period  Weeks    Status  On-going      SLP LONG TERM GOAL #3   Title  Pt will utitlize multimodal communication/compensations for anomia in 12 minutes mod complex conversation over 3 sessions with occasional min A    Time  8    Period  Weeks    Status  On-going       Plan - 04/02/19 1735    Clinical Impression Statement  Mr. Evaristo cont to present with mild anomic aphasia, mild dysarthria and oropharyngeal dysphagia, with MBS 03/13/19 recommendations for dys 1/nectar via teaspoon with head turn right, multiple swallows due to pharyngeal residue. PLease see "other comments" for details of session. SLP upgraded pt to cup sips of "his nectar" (runny nectar) and careful adherence to cough/throat clear following multiple swallows of liquid. I recommend  cont'd skilled ST to address communication and swallowing impairments, in order to maximize pt's independence and safety, increase his ability to communicate wants and needs, and improve quality of life.    Speech Therapy Frequency  2x / week    Duration  --   8 weeks or 17 visits   Treatment/Interventions  Aspiration precaution training;Diet toleration management by SLP;Environmental controls;Trials of upgraded texture/liquids;Cognitive reorganization;Oral motor exercises;Multimodal communcation approach;Compensatory strategies;Pharyngeal strengthening exercises;Language facilitation;Compensatory techniques;Cueing hierarchy;Internal/external aids;Functional tasks;SLP instruction and feedback;Patient/family education   MBS   Potential to Achieve Goals  Good    SLP Home Exercise Plan  Swallowing precautions and diet recommendations provided; needs dysphagia HEP next session    Consulted and Agree with Plan of Care  Patient;Family member/caregiver       Patient will benefit from skilled therapeutic intervention in order to improve the following deficits and impairments:   Dysphagia, oropharyngeal phase  Aphasia  Cognitive communication deficit  Dysarthria and anarthria    Problem List Patient Active Problem List   Diagnosis Date Noted  . Hypokalemia 03/12/2019  . Expressive aphasia 03/10/2019  . Dysphagia 03/10/2019  . Middle cerebral artery embolism, left 03/09/2019  . Diverticular stricture ([place]) 03/07/2019  . Bacteria in urine   . Chronic atrial fibrillation 12/02/2018  . Current use of long term anticoagulation 12/02/2018  . UTI (urinary tract infection) 11/29/2018  . Hyponatremia 11/29/2018  . PAF (paroxysmal atrial fibrillation) ([place]) 11/29/2018  . Diverticulitis of large intestine with abscess 09/23/2018  . Dyslipidemia 12/05/2017  . CAD (coronary artery disease), native coronary artery 12/06/2015  . SOB (shortness of breath)   . NICM (nonischemic cardiomyopathy) ([place])    . Thyroid activity decreased   . Hypothyroidism 08/12/2013  . History of radiation therapy 05/30/2012  . Smokes tobacco daily 05/30/2012  . Cancer of tonsillar fossa ([place]) 11/27/2011  . Tonsil cancer ([place]) 2005  . History of chemotherapy 2005    [place] ,[place], [place]  04/02/2019, 5:38 PM  [facility] [address] [place], [place], [address] Phone: [phone]   Fax:  [phone]   Name: LEALAND RIZK MRN: [id number] Date of Birth: [birth year]

## 2019-04-02 NOTE — Therapy (Signed)
[facility] [address] [place], [place], [address] Phone: [phone]   Fax:  [phone]  Physical Therapy Treatment  Patient Details  Name: Adrian [name] MRN: [id number] Date of Birth: 02-12-1956 Referring Provider (PT): Kristian Covey, MD (PCP) referred to therapy by NP at hospital   Encounter Date: 04/02/2019  PT End of Session - 04/02/19 1052    Visit Number  2    Date for PT Re-Evaluation  05/11/19    Authorization Type  BCBS    PT Start Time  1006    PT Stop Time  1050    PT Time Calculation (min)  44 min    Activity Tolerance  Patient tolerated treatment well    Behavior During Therapy  [facility] for tasks assessed/performed       Past Medical History:  Diagnosis Date  . AF (paroxysmal atrial fibrillation) (HCC) 11/29/2018  . Atrial flutter (HCC) 10/12/2015  . CAD (coronary artery disease), native coronary artery 12/06/2015   a. 10/2015 MV: EF  37%, reversible defect inferior apex, intermediate risk findings; b. 10/2015 Cath: 20% mid RCA.  . Colonic diverticular abscess   . Diverticulitis   . History of chemotherapy 2005   Cisplatin  . Hypothyroidism   . NICM (nonischemic cardiomyopathy) (HCC) 10/14/2015   a. Tachy mediated?;  b. Echo 3/17 - Mild concentric LVH, EF 30-35%, anteroseptal, anterior, anterolateral, apical anterior, lateral hypokinesis, trivial MR, mild to moderately reduced RVSF; c. LHC 3/17 - mRCA 20%  . Paroxysmal atrial flutter (HCC)    a. TEE 3/17 with ? LAA clot-->s/p TEE/DCCV 11/24/2015  . Radiation NOv.3,2005-Dec. 15, 2005   6810 cGy in 30 fractions  . Tonsil cancer [facility]) 2005   Dr Otila Kluver.  XRT    Past Surgical History:  Procedure Laterality Date  . APPENDECTOMY N/A 03/07/2019   Procedure: ROBOTIC ASSISTED APPENDECTOMY;  Surgeon: Karie Soda, MD;  Location: [facility] ORS;  Service: General;  Laterality: N/A;  . CARDIAC CATHETERIZATION N/A 10/18/2015   Procedure: Left Heart Cath and Coronary  Angiography;  Surgeon: Kathleene Hazel, MD;  Location: [facility] INVASIVE CV LAB;  Service: Cardiovascular;  Laterality: N/A;  . CARDIOVERSION N/A 11/24/2015   Procedure: CARDIOVERSION;  Surgeon: Wendall Stade, MD;  Location: [facility] ENDOSCOPY;  Service: Cardiovascular;  Laterality: N/A;  . CYSTOSCOPY WITH STENT PLACEMENT Bilateral 03/07/2019   Procedure: CYSTOSCOPY WITH BILATERAL FIREFLY INJECTION;  Surgeon: Crist Fat, MD;  Location: [facility];  Service: Urology;  Laterality: Bilateral;  . GASTROSTOMY TUBE PLACEMENT  07/04/2004   IR - G tube for tonsilar cancer  . IR ANGIO INTRA EXTRACRAN SEL COM CAROTID INNOMINATE UNI R MOD SED  03/09/2019  . IR ANGIO VERTEBRAL SEL VERTEBRAL UNI R MOD SED  03/09/2019  . IR CT HEAD LTD  03/09/2019  . IR PERCUTANEOUS ART THROMBECTOMY/INFUSION INTRACRANIAL INC DIAG ANGIO  03/09/2019  . IR RADIOLOGIST EVAL & MGMT  12/18/2018  . LAMINECTOMY     C5/placement of steel plate  . NECK SURGERY  2003   replaced disk  . RADIOLOGY WITH ANESTHESIA N/A 03/08/2019   Procedure: IR WITH ANESTHESIA;  Surgeon: Julieanne Cotton, MD;  Location: [facility] OR;  Service: Radiology;  Laterality: N/A;  . TEE WITHOUT CARDIOVERSION N/A 10/15/2015   Procedure: TRANSESOPHAGEAL ECHOCARDIOGRAM (TEE);  Surgeon: Laurey Morale, MD;  Location: [facility] ENDOSCOPY;  Service: Cardiovascular;  Laterality: N/A;  . TEE WITHOUT CARDIOVERSION N/A 11/24/2015   Procedure: TRANSESOPHAGEAL ECHOCARDIOGRAM (TEE);  Surgeon: Theron Arista  Phillips Hay, MD;  Location: [facility] ENDOSCOPY;  Service: Cardiovascular;  Laterality: N/A;  . XI ROBOTIC ASSISTED COLOSTOMY TAKEDOWN N/A 03/07/2019   Procedure: XI ROBOTIC ASSISTED LOW ANTERIOR RESECTION, RIGID PROCTOSCOPY;  Surgeon: Karie Soda, MD;  Location: [facility] ORS;  Service: General;  Laterality: N/A;    There were no vitals filed for this visit.  Subjective Assessment - 04/02/19 1009    Subjective  Patietn reports tha the is looking to try to get back to work in 4 weeks, job is standing 6 hours a day  and has to lift 70#    Currently in Pain?  No/denies                       [facility] Adult PT Treatment/Exercise - 04/02/19 0001      Ambulation/Gait   Gait Comments  up and down stairs 2 x, then with 4# hand weight walking around the building doing curls and shoulder press.        Exercises   Exercises  Knee/Hip      Knee/Hip Exercises: Aerobic   Nustep  level 5 x 6 minutes      Knee/Hip Exercises: Machines for Strengthening   Cybex Knee Extension  5# 2x10    Cybex Knee Flexion  20# 2x10    Cybex Leg Press  20# 2x10    Other Machine  20# seated row, lats pulls 20# 2x10, 10# shoulder press                  PT Long Term Goals - 04/02/19 1055      PT LONG TERM GOAL #1   Title  Pt will demonstrate independence with balance and strengthening HEP    Status  On-going            Plan - 04/02/19 1053    Clinical Impression Statement  Patient doing well, he tolerated all exercises well, he was a little winded and having some difficulty with maintaining the exercise speed.  We did very light weights.  He did have questions about driving and I gave input on safety and practice.    PT Next Visit Plan  increase as tolerated work toward job functions    Consulted and Agree with Plan of Care  Patient;Family member/caregiver       Patient will benefit from skilled therapeutic intervention in order to improve the following deficits and impairments:     Visit Diagnosis: Muscle weakness (generalized)  Unsteadiness on feet     Problem List Patient Active Problem List   Diagnosis Date Noted  . Hypokalemia 03/12/2019  . Expressive aphasia 03/10/2019  . Dysphagia 03/10/2019  . Middle cerebral artery embolism, left 03/09/2019  . Diverticular stricture (HCC) 03/07/2019  . Bacteria in urine   . Chronic atrial fibrillation 12/02/2018  . Current use of long term anticoagulation 12/02/2018  . UTI (urinary tract infection) 11/29/2018  . Hyponatremia  11/29/2018  . PAF (paroxysmal atrial fibrillation) (HCC) 11/29/2018  . Diverticulitis of large intestine with abscess 09/23/2018  . Dyslipidemia 12/05/2017  . CAD (coronary artery disease), native coronary artery 12/06/2015  . SOB (shortness of breath)   . NICM (nonischemic cardiomyopathy) (HCC)   . Thyroid activity decreased   . Hypothyroidism 08/12/2013  . History of radiation therapy 05/30/2012  . Smokes tobacco daily 05/30/2012  . Cancer of tonsillar fossa (HCC) 11/27/2011  . Tonsil cancer (HCC) 2005  . History of chemotherapy 2005    Sequan Auxier W., PT 04/02/2019,  11:05 AM  [facility] [address] [place], [place], [address] Phone: [phone]   Fax:  [phone]  Name: Adrian [name] MRN: [id number] Date of Birth: [birth year]

## 2019-04-03 ENCOUNTER — Other Ambulatory Visit: Payer: Self-pay

## 2019-04-03 ENCOUNTER — Ambulatory Visit: Payer: BC Managed Care – PPO

## 2019-04-03 ENCOUNTER — Telehealth: Payer: Self-pay

## 2019-04-03 DIAGNOSIS — I4891 Unspecified atrial fibrillation: Secondary | ICD-10-CM

## 2019-04-03 DIAGNOSIS — I480 Paroxysmal atrial fibrillation: Secondary | ICD-10-CM

## 2019-04-03 NOTE — Telephone Encounter (Signed)
The patient remains asymptomatic since stopping Bystolic days ago.  His BP has been 90s/70s with HR 65.  He will continue to monitor the next few days and inform us early next week of recordings.

## 2019-04-03 NOTE — Telephone Encounter (Signed)
The patient came into the office for EKG per Dr [name] to rule out Afib/flutter. The EKG will be scanned into Epic for further advisement per Dr [name].

## 2019-04-03 NOTE — Telephone Encounter (Signed)
He is in NSR.  Please find out how his BP had been and how he is feeling

## 2019-04-03 NOTE — Progress Notes (Signed)
complete

## 2019-04-04 ENCOUNTER — Other Ambulatory Visit: Payer: Self-pay

## 2019-04-04 ENCOUNTER — Other Ambulatory Visit (INDEPENDENT_AMBULATORY_CARE_PROVIDER_SITE_OTHER): Payer: BC Managed Care – PPO

## 2019-04-04 ENCOUNTER — Ambulatory Visit: Payer: BC Managed Care – PPO

## 2019-04-04 DIAGNOSIS — R4701 Aphasia: Secondary | ICD-10-CM

## 2019-04-04 DIAGNOSIS — R41841 Cognitive communication deficit: Secondary | ICD-10-CM

## 2019-04-04 DIAGNOSIS — M6281 Muscle weakness (generalized): Secondary | ICD-10-CM

## 2019-04-04 DIAGNOSIS — R1312 Dysphagia, oropharyngeal phase: Secondary | ICD-10-CM

## 2019-04-04 DIAGNOSIS — R471 Dysarthria and anarthria: Secondary | ICD-10-CM

## 2019-04-04 DIAGNOSIS — D649 Anemia, unspecified: Secondary | ICD-10-CM

## 2019-04-04 LAB — CBC WITH DIFFERENTIAL/PLATELET
Basophils Absolute: 0 10*3/uL (ref 0.0–0.1)
Basophils Relative: 0.8 % (ref 0.0–3.0)
Eosinophils Absolute: 0 10*3/uL (ref 0.0–0.7)
Eosinophils Relative: 1.1 % (ref 0.0–5.0)
HCT: 36.9 % — ABNORMAL LOW (ref 39.0–52.0)
Hemoglobin: 12.6 g/dL — ABNORMAL LOW (ref 13.0–17.0)
Lymphocytes Relative: 82.2 % — ABNORMAL HIGH (ref 12.0–46.0)
Lymphs Abs: 3.5 10*3/uL (ref 0.7–4.0)
MCHC: 34 g/dL (ref 30.0–36.0)
MCV: 90.8 fl (ref 78.0–100.0)
Monocytes Absolute: 0.6 10*3/uL (ref 0.1–1.0)
Monocytes Relative: 14.6 % — ABNORMAL HIGH (ref 3.0–12.0)
Neutro Abs: 0.1 10*3/uL — ABNORMAL LOW (ref 1.4–7.7)
Neutrophils Relative %: 1.3 % — ABNORMAL LOW (ref 43.0–77.0)
Platelets: 211 10*3/uL (ref 150.0–400.0)
RBC: 4.07 Mil/uL — ABNORMAL LOW (ref 4.22–5.81)
RDW: 16.5 % — ABNORMAL HIGH (ref 11.5–15.5)
WBC: 4.3 10*3/uL (ref 4.0–10.5)

## 2019-04-04 LAB — VITAMIN D 25 HYDROXY (VIT D DEFICIENCY, FRACTURES): VITD: 30.22 ng/mL (ref 30.00–100.00)

## 2019-04-04 NOTE — Therapy (Signed)
[facility] [address] [place], [place], [address] Phone: [phone]   Fax:  [phone]  Speech Language Pathology Treatment  Patient Details  Name: Adrian [name] MRN: [id number] Date of Birth: [birth year] Referring Provider (SLP): [name], MD (PCP) referred to therapy by NP at hospital   Encounter Date: 04/04/2019  End of Session - 04/04/19 1722    Visit Number  3    Number of Visits  17    Date for SLP Re-Evaluation  05/24/19    Authorization Type  BCBS    SLP Start Time  [id number]    SLP Stop Time   1231    SLP Time Calculation (min)  40 min    Activity Tolerance  Patient tolerated treatment well       Past Medical History:  Diagnosis Date  . AF (paroxysmal atrial fibrillation) ([place]) 11/29/2018  . Atrial flutter ([place]) 10/12/2015  . CAD (coronary artery disease), native coronary artery 12/06/2015   a. 10/2015 MV: EF  37%, reversible defect inferior apex, intermediate risk findings; b. 10/2015 Cath: 20% mid RCA.  . Colonic diverticular abscess   . Diverticulitis   . History of chemotherapy 2005   Cisplatin  . Hypothyroidism   . NICM (nonischemic cardiomyopathy) ([place]) 10/14/2015   a. Tachy mediated?;  b. Echo 3/17 - Mild concentric LVH, EF 30-35%, anteroseptal, anterior, anterolateral, apical anterior, lateral hypokinesis, trivial MR, mild to moderately reduced RVSF; c. LHC 3/17 - mRCA 20%  . Paroxysmal atrial flutter ([place])    a. TEE 3/17 with ? LAA clot-->s/p TEE/DCCV 11/24/2015  . Radiation NOv.3,2005-Dec. 15, 2005   6810 cGy in 30 fractions  . 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  XRT    Past Surgical History:  Procedure Laterality Date  . APPENDECTOMY N/A 03/07/2019   Procedure: ROBOTIC ASSISTED APPENDECTOMY;  Surgeon: Michael Boston, MD;  [facility];  Service: General;  Laterality: N/A;  . CARDIAC CATHETERIZATION N/A 10/18/2015   Procedure: Left Heart Cath and Coronary Angiography;  Surgeon:  Burnell Blanks, MD;  Location: [place] CV LAB;  Service: Cardiovascular;  Laterality: N/A;  . CARDIOVERSION N/A 11/24/2015   Procedure: CARDIOVERSION;  Surgeon: Josue Hector, MD;  Location: [place];  Service: Cardiovascular;  Laterality: N/A;  . CYSTOSCOPY WITH STENT PLACEMENT Bilateral 03/07/2019   Procedure: CYSTOSCOPY WITH BILATERAL FIREFLY INJECTION;  Surgeon: Ardis Hughs, MD;  Location[facility];  Service: Urology;  Laterality: Bilateral;  . GASTROSTOMY TUBE PLACEMENT  07/04/2004   IR - G tube for tonsilar cancer  . IR ANGIO INTRA EXTRACRAN SEL COM CAROTID INNOMINATE UNI R MOD SED  03/09/2019  . IR ANGIO VERTEBRAL SEL VERTEBRAL UNI R MOD SED  03/09/2019  . IR CT HEAD LTD  03/09/2019  . IR PERCUTANEOUS ART THROMBECTOMY/INFUSION INTRACRANIAL INC DIAG ANGIO  03/09/2019  . IR RADIOLOGIST EVAL & MGMT  12/18/2018  . LAMINECTOMY     C5/placement of steel plate  . NECK SURGERY  2003   replaced disk  . RADIOLOGY WITH ANESTHESIA N/A 03/08/2019   Procedure: IR WITH ANESTHESIA;  Surgeon: Luanne Bras, MD;  Location: [facility];  Service: Radiology;  Laterality: N/A;  . TEE WITHOUT CARDIOVERSION N/A 10/15/2015   Procedure: TRANSESOPHAGEAL ECHOCARDIOGRAM (TEE);  Surgeon: Larey Dresser, MD;  Location: [facility];  Service: Cardiovascular;  Laterality: N/A;  . TEE WITHOUT CARDIOVERSION N/A 11/24/2015   Procedure: TRANSESOPHAGEAL ECHOCARDIOGRAM (TEE);  Surgeon: Josue Hector, MD;  Location:  [facility] ENDOSCOPY;  Service: Cardiovascular;  Laterality: N/A;  . XI ROBOTIC ASSISTED COLOSTOMY TAKEDOWN N/A 03/07/2019   Procedure: XI ROBOTIC ASSISTED LOW ANTERIOR RESECTION, RIGID PROCTOSCOPY;  Surgeon: Michael Boston, MD;  Location: [facility] ORS;  Service: General;  Laterality: N/A;    There were no vitals filed for this visit.         ADULT SLP TREATMENT - 04/04/19 1716      General Information   Behavior/Cognition  Alert;Cooperative;Pleasant mood      Dysphagia Treatment   Temperature Spikes  Noted  No    Respiratory Status  Room air    Patient observed directly with PO's  Yes    Type of PO's observed  Dysphagia 3 (soft);Thin liquids    Pharyngeal Phase Signs & Symptoms  Immediate throat clear   in addition to the swallow strategy throat clear   Other treatment/comments  Pt observed with tuna salad and water. Larger sips H2O resulted in more frequent additional/necessary throat clears than smaller sips. (x1 on 4 smaller sips, as opposed to x5 on 7 larger sips). Pt was independent with swallow strategies today, did not require SLP cues. SLP initiated two exercises for pt and pt will require more exercises in the future. Effortful swallow was completed with independence, Caryl Ada was completed with mod-max cues and not completed correctly x4 attempts. SLP will need to cont to teach this exercise during ST sessions.      Assessment / Recommendations / Plan   Plan  Continue with current plan of care      Dysphagia Recommendations   Diet recommendations  Dysphagia 3 (mechanical soft);Thin liquid    Liquids provided via  Cup    Medication Administration  Whole meds with puree    Compensations  Slow rate;Small sips/bites;Multiple dry swallows after each bite/sip;Clear throat after each swallow    Postural Changes and/or Swallow Maneuvers  Head turn right during swallow      Progression Toward Goals   Progression toward goals  Progressing toward goals       SLP Education - 04/04/19 1721    Education Details  Pt should consume thin liquids with very judicious smaller sips and all other swallow strategies    Person(s) Educated  Patient;Child(ren)    Methods  Explanation;Demonstration;Verbal cues    Comprehension  Verbal cues required;Verbalized understanding;Need further instruction;Returned demonstration       SLP Short Term Goals - 04/04/19 1724      SLP SHORT TERM GOAL #1   Title  Pt will demo HEP for dysphagia with rare min A x 3 sessions.    Time  4    Period  Weeks     Status  On-going      SLP SHORT TERM GOAL #2   Title  Pt will complete further assessment of cognition and writing and goals added as appropriate, within first 4 visits.    Time  2    Period  Weeks    Status  On-going      SLP SHORT TERM GOAL #3   Title  Pt will utitlize multimodal communication/compensations for anomia in 8 minutes simple-mod complex conversation over 3 sessions with occasional min A    Time  4    Period  Weeks    Status  On-going       SLP Long Term Goals - 04/04/19 1726      SLP LONG TERM GOAL #1   Title  Pt will complete HEP for dysphagia with modified  independence x 3 sessions.    Time  8    Period  Weeks    Status  On-going      SLP LONG TERM GOAL #2   Title  Pt will participate in repeat MBS for objective assessment of swallow function to determine readiness for diet advancement.    Time  8    Period  Weeks    Status  On-going      SLP LONG TERM GOAL #3   Title  Pt will utitlize multimodal communication/compensations for anomia in 12 minutes mod complex conversation over 3 sessions with occasional min A    Time  8    Period  Weeks    Status  On-going       Plan - 04/04/19 1723    Clinical Impression Statement  Mr. Laplante cont to present with mild anomic aphasia, mild dysarthria and oropharyngeal dysphagia. Dysphagia appears to be resolving; SLP upgraded pt diet to dysIII/thin with all swallow precautions still recommended. Pt must remain judicious about smaller sip size. An HEP was initiated for improving swallow strength and will need to be added to in pt's next 1-2 sessions. Please see "other comments" for details of session. SLP upgraded pt to cup sips of "his nectar" (runny nectar) and careful adherence to cough/throat clear following multiple swallows of liquid. I recommend cont'd skilled ST to address communication and swallowing impairments, in order to maximize pt's independence and safety, increase his ability to communicate wants and needs, and  improve quality of life.    Speech Therapy Frequency  2x / week    Duration  --   8 weeks or 17 visits   Treatment/Interventions  [place] precaution training;Diet toleration management by SLP;Environmental controls;Trials of upgraded texture/liquids;Cognitive reorganization;Oral motor exercises;Multimodal communcation approach;Compensatory strategies;Pharyngeal strengthening exercises;Language facilitation;Compensatory techniques;Cueing hierarchy;Internal/external aids;Functional tasks;SLP instruction and feedback;Patient/family education   MBS   Potential to Achieve Goals  Good    SLP Home Exercise Plan  Swallowing precautions and diet recommendations provided; needs dysphagia HEP next session    Consulted and Agree with Plan of Care  Patient;Family member/caregiver       Patient will benefit from skilled therapeutic intervention in order to improve the following deficits and impairments:   Dysphagia, oropharyngeal phase  Aphasia  Cognitive communication deficit  Dysarthria and anarthria    Problem List Patient Active Problem List   Diagnosis Date Noted  . Hypokalemia 03/12/2019  . Expressive aphasia 03/10/2019  . Dysphagia 03/10/2019  . Middle cerebral artery embolism, left 03/09/2019  . Diverticular stricture ([place]) 03/07/2019  . Bacteria in urine   . Chronic atrial fibrillation 12/02/2018  . Current use of long term anticoagulation 12/02/2018  . UTI (urinary tract infection) 11/29/2018  . Hyponatremia 11/29/2018  . PAF (paroxysmal atrial fibrillation) ([place]) 11/29/2018  . Diverticulitis of large intestine with abscess 09/23/2018  . Dyslipidemia 12/05/2017  . CAD (coronary artery disease), native coronary artery 12/06/2015  . SOB (shortness of breath)   . NICM (nonischemic cardiomyopathy) ([place])   . Thyroid activity decreased   . Hypothyroidism 08/12/2013  . History of radiation therapy 05/30/2012  . Smokes tobacco daily 05/30/2012  . Cancer of tonsillar fossa ([place])  11/27/2011  . Tonsil cancer ([place]) 2005  . History of chemotherapy 2005    [place] [place] ,[place], [place]  04/04/2019, 5:26 PM  [facility] [address] [place], [place], [address] Phone: [phone]   Fax:  [phone]   Name: TAHER WILCH MRN: [id number]  Date of Birth: May 10, 1956

## 2019-04-04 NOTE — Patient Instructions (Signed)
  SWALLOWING EXERCISES   1. Effortful Swallows - Press your tongue against the roof of your mouth for 3 seconds, then squeeze the muscles in your neck while you swallow your saliva or a sip of water - Repeat 10-15 times, 2-3 times a day, and use whenever you eat or drink  2. Masako Swallow - swallow with your tongue sticking out - Stick tongue out past your teeth and gently bite tongue with your teeth - Swallow, while holding your tongue with your teeth - Repeat 10-15 times, 2-3 times a day *use a wet spoon if your mouth gets dry*  3. Pitch Raise - Repeat "he", once per second in as high of a pitch as you can - Repeat 20 times, 2-3 times a day  4. Shaker Exercise - head lift - Lie flat on your back in your bed or on a couch without pillows - Raise your head and look at your feet - KEEP YOUR SHOULDERS DOWN - HOLD FOR 45-60 SECONDS, then lower your head back down - Repeat 3 times, 2-3 times a day  5. Mendelsohn Maneuver - "half swallow" exercise - Swallow hard for 5 seconds, then relax - Repeat 10-15,  2-3 times a day *use a wet spoon if your mouth gets dry*  6. Tongue Stretch/Teeth Clean - Move your tongue around the pocket between your gums and teeth, clockwise and then counter-clockwise - Repeat on the back side, clockwise and then counter-clockwise - Repeat 15-20 times, 2-3 times a day  7. Breath Hold - Say "HUH!" loudly, then hold your breath for 3 seconds at your voice box - Repeat 20 times, 2-3 times a day  8. Chin pushback - Open your mouth  - Place your fist UNDER your chin near your neck, and push back with your fist for 5 seconds - Repeat 10 times, 2-3 times a day        9.  Open mouth swallow  - Open your mouth and swallow your saliva  - Repeat 10 times, 2-3 times a day

## 2019-04-07 ENCOUNTER — Other Ambulatory Visit: Payer: Self-pay

## 2019-04-07 ENCOUNTER — Ambulatory Visit: Payer: BC Managed Care – PPO | Admitting: Occupational Therapy

## 2019-04-07 ENCOUNTER — Ambulatory Visit: Payer: BC Managed Care – PPO

## 2019-04-07 DIAGNOSIS — R41841 Cognitive communication deficit: Secondary | ICD-10-CM

## 2019-04-07 DIAGNOSIS — R471 Dysarthria and anarthria: Secondary | ICD-10-CM

## 2019-04-07 DIAGNOSIS — R4701 Aphasia: Secondary | ICD-10-CM

## 2019-04-07 DIAGNOSIS — M6281 Muscle weakness (generalized): Secondary | ICD-10-CM | POA: Diagnosis not present

## 2019-04-07 DIAGNOSIS — R1312 Dysphagia, oropharyngeal phase: Secondary | ICD-10-CM

## 2019-04-07 NOTE — Addendum Note (Signed)
Encounter addended by: Pricilla Larsson, [facility] on: 04/07/2019 4:08 PM  Actions taken: Order Reconciliation Section accessed

## 2019-04-07 NOTE — Therapy (Signed)
[facility] [address] [place], [place], [address] Phone: [phone]   Fax:  [phone]  Speech Language Pathology Treatment  Patient Details  Name: Adrian [name] MRN: [id number] Date of Birth: 1956-05-08 Referring Provider (SLP): [name], MD (PCP) referred to therapy by NP at hospital   Encounter Date: 04/07/2019  End of Session - 04/07/19 1227    Visit Number  4    Number of Visits  17    Date for SLP Re-Evaluation  05/24/19    Authorization Type  BCBS    SLP Start Time  1103    SLP Stop Time   1145    SLP Time Calculation (min)  42 min    Activity Tolerance  Patient tolerated treatment well       Past Medical History:  Diagnosis Date  . AF (paroxysmal atrial fibrillation) ([place]) 11/29/2018  . Atrial flutter ([place]) 10/12/2015  . CAD (coronary artery disease), native coronary artery 12/06/2015   a. 10/2015 MV: EF  37%, reversible defect inferior apex, intermediate risk findings; b. 10/2015 Cath: 20% mid RCA.  . Colonic diverticular abscess   . Diverticulitis   . History of chemotherapy 2005   Cisplatin  . Hypothyroidism   . NICM (nonischemic cardiomyopathy) ([place]) 10/14/2015   a. Tachy mediated?;  b. Echo 3/17 - Mild concentric LVH, EF 30-35%, anteroseptal, anterior, anterolateral, apical anterior, lateral hypokinesis, trivial MR, mild to moderately reduced RVSF; c. LHC 3/17 - mRCA 20%  . Paroxysmal atrial flutter ([place])    a. TEE 3/17 with ? LAA clot-->s/p TEE/DCCV 11/24/2015  . Radiation NOv.3,2005-Dec. 15, 2005   6810 cGy in 30 fractions  . Tonsil cancer [facility]) 2005   Dr Romeo Rabon.  XRT    Past Surgical History:  Procedure Laterality Date  . APPENDECTOMY N/A 03/07/2019   Procedure: ROBOTIC ASSISTED APPENDECTOMY;  Surgeon: Michael Boston, MD;  [facility];  Service: General;  Laterality: N/A;  . CARDIAC CATHETERIZATION N/A 10/18/2015   Procedure: Left Heart Cath and Coronary Angiography;  Surgeon:  Burnell Blanks, MD;  Location: [facility];  Service: Cardiovascular;  Laterality: N/A;  . CARDIOVERSION N/A 11/24/2015   Procedure: CARDIOVERSION;  Surgeon: Josue Hector, MD;  Location: [facility];  Service: Cardiovascular;  Laterality: N/A;  . CYSTOSCOPY WITH STENT PLACEMENT Bilateral 03/07/2019   Procedure: CYSTOSCOPY WITH BILATERAL FIREFLY INJECTION;  Surgeon: Ardis Hughs, MD;  [facility];  Service: Urology;  Laterality: Bilateral;  . GASTROSTOMY TUBE PLACEMENT  07/04/2004   IR - G tube for tonsilar cancer  . IR ANGIO INTRA EXTRACRAN SEL COM CAROTID INNOMINATE UNI R MOD SED  03/09/2019  . IR ANGIO VERTEBRAL SEL VERTEBRAL UNI R MOD SED  03/09/2019  . IR CT HEAD LTD  03/09/2019  . IR PERCUTANEOUS ART THROMBECTOMY/INFUSION INTRACRANIAL INC DIAG ANGIO  03/09/2019  . IR RADIOLOGIST EVAL & MGMT  12/18/2018  . LAMINECTOMY     C5/placement of steel plate  . NECK SURGERY  2003   replaced disk  . RADIOLOGY WITH ANESTHESIA N/A 03/08/2019   Procedure: IR WITH ANESTHESIA;  Surgeon: Luanne Bras, MD;  Location: [facility];  Service: Radiology;  Laterality: N/A;  . TEE WITHOUT CARDIOVERSION N/A 10/15/2015   Procedure: TRANSESOPHAGEAL ECHOCARDIOGRAM (TEE);  Surgeon: Larey Dresser, MD;  Location: [facility];  Service: Cardiovascular;  Laterality: N/A;  . TEE WITHOUT CARDIOVERSION N/A 11/24/2015   Procedure: TRANSESOPHAGEAL ECHOCARDIOGRAM (TEE);  Surgeon: Josue Hector, MD;  [facility];  Service: Cardiovascular;  Laterality: N/A;  . XI ROBOTIC ASSISTED COLOSTOMY TAKEDOWN N/A 03/07/2019   Procedure: XI ROBOTIC ASSISTED LOW ANTERIOR RESECTION, RIGID PROCTOSCOPY;  Surgeon: Michael Boston, MD;  [facility];  Service: General;  Laterality: N/A;    There were no vitals filed for this visit.  Subjective Assessment - 04/07/19 1108    Subjective  "No problems at all, really." (pt, re: foods over the weekend)/    Patient is accompained by:  Family member   ex-wife,  Archie Patten           ADULT SLP TREATMENT - 04/07/19 1109      General Information   Behavior/Cognition  Alert;Cooperative;Pleasant mood      Treatment Provided   Treatment provided  Dysphagia      Dysphagia Treatment   Other treatment/comments  Pt had some banana bread over the weekend. SLP suggested that pt soften bread with butter or some other type of oil, or slurry. SLP furhter educated pt on how to make breads and other drier food items into more dys III than regular diet. SLP added "Hee" (vocal pitch raise) to pt's swallowing exercises. In SLP training pt this exercise pt showed s/sx of possible verbal apraxia during this exercise and req'd mod-max shaping/assist from SLP for articulation of "He" instead of "who" and loud grunting noises, initially. Pt eventually produced "he" with higher pitch however this exercise will need to be asessed periodically to see if pt requires furhter assistance with procedure.       Cognitive-Linquistic Treatment   Treatment focused on  Aphasia    Skilled Treatment  Pt reports in the evening his speech is worse. Ex-wife played voicemail message of pt's and pt's voice is notably higher in pitch with more inflection than currently. In conversation with pt today pt used compensations (synonym) to tell less than detailed, but accurate, aspects of his message. SLP had pt attempt writing words pt struggeld with anomia however pt very aphasic in his writing. SLP asked about pt job and pt job purely physcial (from how pt describes) where he would not need any verbal interaction with anyone for his workday. Pt does want to drive - SLP ?s a decr'd cognitive ability vs. pt personality. SLP suggested pt get auth from neurologist on 04-15-19 before pt drive independently.       Assessment / Recommendations / Plan   Plan  Continue with current plan of care       SLP Education - 04/07/19 1226    Education Details  pitch raise, overt s/sx aspiration PNA, usage of  sauces/gravies/condiments to make drier foods more soft for dys III, pt not drive until neurologist appt 04-15-19    Person(s) Educated  Patient;Spouse   ex-wife   Methods  Explanation;Demonstration;Verbal cues    Comprehension  Verbalized understanding;Need further instruction;Returned demonstration;Verbal cues required       SLP Short Term Goals - 04/07/19 1230      SLP SHORT TERM GOAL #1   Title  Pt will demo HEP for dysphagia with rare min A x 3 sessions.    Time  3    Period  Weeks    Status  On-going      SLP SHORT TERM GOAL #2   Title  Pt will complete further assessment of cognition and writing and goals added as appropriate, within first 4 visits.    Time  1    Period  Weeks    Status  On-going  SLP SHORT TERM GOAL #3   Title  Pt will utitlize multimodal communication/compensations for anomia in 8 minutes simple-mod complex conversation over 3 sessions with occasional min A    Time  3    Period  Weeks    Status  On-going       SLP Long Term Goals - 04/07/19 1230      SLP LONG TERM GOAL #1   Title  Pt will complete HEP for dysphagia with modified independence x 3 sessions.    Time  7    Period  Weeks    Status  On-going      SLP LONG TERM GOAL #2   Title  Pt will participate in repeat MBS for objective assessment of swallow function to determine readiness for diet advancement.    Time  7    Period  Weeks    Status  On-going      SLP LONG TERM GOAL #3   Title  Pt will utitlize multimodal communication/compensations for anomia in 12 minutes mod complex conversation over 3 sessions with occasional min A    Time  7    Period  Weeks    Status  On-going       Plan - 04/07/19 1228    Clinical Impression Statement  Mr. Deutscher cont to present with mild anomic aphasia, mild dysarthria and oropharyngeal dysphagia. SLP cont to question whether pt with cognitive deficits or not. Dysphagia appears to be resolving. HEP was expanded for swallowing today by including  vocal pitch raise, which pt req'd mod-max A from SLP initially. Please see "other comments" and "skilled intervention" for more details of session. SLP upgraded pt to cup sips of "his nectar" (runny nectar) and careful adherence to cough/throat clear following multiple swallows of liquid. I recommend cont'd skilled ST to address communication and swallowing impairments, in order to maximize pt's independence and safety, increase his ability to communicate wants and needs, and improve quality of life.    Speech Therapy Frequency  2x / week    Duration  --   8 weeks or 17 visits   Treatment/Interventions  Aspiration precaution training;Diet toleration management by SLP;Environmental controls;Trials of upgraded texture/liquids;Cognitive reorganization;Oral motor exercises;Multimodal communcation approach;Compensatory strategies;Pharyngeal strengthening exercises;Language facilitation;Compensatory techniques;Cueing hierarchy;Internal/external aids;Functional tasks;SLP instruction and feedback;Patient/family education   MBS   Potential to Achieve Goals  Good    SLP Home Exercise Plan  Swallowing precautions and diet recommendations provided; needs dysphagia HEP next session    Consulted and Agree with Plan of Care  Patient;Family member/caregiver       Patient will benefit from skilled therapeutic intervention in order to improve the following deficits and impairments:   Dysphagia, oropharyngeal phase  Aphasia  Cognitive communication deficit  Dysarthria and anarthria    Problem List Patient Active Problem List   Diagnosis Date Noted  . Hypokalemia 03/12/2019  . Expressive aphasia 03/10/2019  . Dysphagia 03/10/2019  . Middle cerebral artery embolism, left 03/09/2019  . Diverticular stricture ([place]) 03/07/2019  . Bacteria in urine   . Chronic atrial fibrillation 12/02/2018  . Current use of long term anticoagulation 12/02/2018  . UTI (urinary tract infection) 11/29/2018  . Hyponatremia  11/29/2018  . PAF (paroxysmal atrial fibrillation) ([place]) 11/29/2018  . Diverticulitis of large intestine with abscess 09/23/2018  . Dyslipidemia 12/05/2017  . CAD (coronary artery disease), native coronary artery 12/06/2015  . SOB (shortness of breath)   . NICM (nonischemic cardiomyopathy) ([place])   . Thyroid activity decreased   .  Hypothyroidism 08/12/2013  . History of radiation therapy 05/30/2012  . Smokes tobacco daily 05/30/2012  . Cancer of tonsillar fossa ([place]) 11/27/2011  . Tonsil cancer ([place]) 2005  . History of chemotherapy 2005    [facility] ,[place], [place] 04/07/2019, 12:31 PM  [facility] [address] [place], [place], [address] Phone: [phone]   Fax:  [phone]   Name: Adrian [name] MRN: [id number] Date of Birth: June 12, 1956

## 2019-04-07 NOTE — Patient Instructions (Addendum)
Signs of Aspiration Pneumonia   . Chest pain/tightness . Fever (can be low grade) . Cough  o With foul-smelling phlegm (sputum) o With sputum containing pus or blood o With greenish sputum . Fatigue  . Shortness of breath  . Wheezing   **IF YOU HAVE THESE SIGNS, CONTACT YOUR DOCTOR OR GO TO THE EMERGENCY DEPARTMENT OR URGENT CARE AS SOON AS POSSIBLE**    ====================================================================  Add to your swallow exercises:  # 3 "HEEE" exercises (remember HEE-HAW - like a donkey)

## 2019-04-08 ENCOUNTER — Other Ambulatory Visit: Payer: Self-pay

## 2019-04-08 DIAGNOSIS — E039 Hypothyroidism, unspecified: Secondary | ICD-10-CM

## 2019-04-09 ENCOUNTER — Ambulatory Visit: Payer: BC Managed Care – PPO | Attending: Nurse Practitioner | Admitting: Physical Therapy

## 2019-04-09 ENCOUNTER — Other Ambulatory Visit: Payer: Self-pay

## 2019-04-09 ENCOUNTER — Encounter: Payer: Self-pay | Admitting: Physical Therapy

## 2019-04-09 DIAGNOSIS — I69318 Other symptoms and signs involving cognitive functions following cerebral infarction: Secondary | ICD-10-CM | POA: Diagnosis present

## 2019-04-09 DIAGNOSIS — R471 Dysarthria and anarthria: Secondary | ICD-10-CM | POA: Diagnosis present

## 2019-04-09 DIAGNOSIS — R1312 Dysphagia, oropharyngeal phase: Secondary | ICD-10-CM | POA: Diagnosis present

## 2019-04-09 DIAGNOSIS — R41841 Cognitive communication deficit: Secondary | ICD-10-CM | POA: Diagnosis present

## 2019-04-09 DIAGNOSIS — R29818 Other symptoms and signs involving the nervous system: Secondary | ICD-10-CM | POA: Diagnosis present

## 2019-04-09 DIAGNOSIS — R2681 Unsteadiness on feet: Secondary | ICD-10-CM | POA: Diagnosis present

## 2019-04-09 DIAGNOSIS — M6281 Muscle weakness (generalized): Secondary | ICD-10-CM | POA: Insufficient documentation

## 2019-04-09 DIAGNOSIS — R278 Other lack of coordination: Secondary | ICD-10-CM | POA: Insufficient documentation

## 2019-04-09 DIAGNOSIS — R4701 Aphasia: Secondary | ICD-10-CM | POA: Diagnosis present

## 2019-04-09 NOTE — Therapy (Signed)
[facility] [place], [place], [address] Phone: [phone]   Fax:  [phone]  Physical Therapy Treatment  Patient Details  Name: Adrian [name] MRN: [id number] Date of Birth: [birth year] Referring Provider (PT): [name], MD (PCP) referred to therapy by NP at hospital   Encounter Date: 04/09/2019  PT End of Session - 04/09/19 1056    Visit Number  3    Number of Visits  7    Date for PT Re-Evaluation  05/11/19    Authorization Type  BCBS    PT Start Time  1013    PT Stop Time  1055    PT Time Calculation (min)  42 min    Activity Tolerance  Patient tolerated treatment well    Behavior During Therapy  [facility] for tasks assessed/performed       Past Medical History:  Diagnosis Date  . AF (paroxysmal atrial fibrillation) ([place]) 11/29/2018  . Atrial flutter ([place]) 10/12/2015  . CAD (coronary artery disease), native coronary artery 12/06/2015   a. 10/2015 MV: EF  37%, reversible defect inferior apex, intermediate risk findings; b. 10/2015 Cath: 20% mid RCA.  . Colonic diverticular abscess   . Diverticulitis   . History of chemotherapy 2005   Cisplatin  . Hypothyroidism   . NICM (nonischemic cardiomyopathy) ([place]) 10/14/2015   a. Tachy mediated?;  b. Echo 3/17 - Mild concentric LVH, EF 30-35%, anteroseptal, anterior, anterolateral, apical anterior, lateral hypokinesis, trivial MR, mild to moderately reduced RVSF; c. LHC 3/17 - mRCA 20%  . Paroxysmal atrial flutter ([place])    a. TEE 3/17 with ? LAA clot-->s/p TEE/DCCV 11/24/2015  . Radiation NOv.3,2005-Dec. 15, 2005   6810 cGy in 30 fractions  . Tonsil cancer [facility]) 2005   Dr Romeo Rabon.  XRT    Past Surgical History:  Procedure Laterality Date  . APPENDECTOMY N/A 03/07/2019   Procedure: ROBOTIC ASSISTED APPENDECTOMY;  Surgeon: Michael Boston, MD;  [facility];  Service: General;  Laterality: N/A;  . CARDIAC CATHETERIZATION N/A 10/18/2015   Procedure: Left  Heart Cath and Coronary Angiography;  Surgeon: Burnell Blanks, MD;  Location: [facility];  Service: Cardiovascular;  Laterality: N/A;  . CARDIOVERSION N/A 11/24/2015   Procedure: CARDIOVERSION;  Surgeon: Josue Hector, MD;  Location: [facility];  Service: Cardiovascular;  Laterality: N/A;  . CYSTOSCOPY WITH STENT PLACEMENT Bilateral 03/07/2019   Procedure: CYSTOSCOPY WITH BILATERAL FIREFLY INJECTION;  Surgeon: Ardis Hughs, MD;  [facility];  Service: Urology;  Laterality: Bilateral;  . GASTROSTOMY TUBE PLACEMENT  07/04/2004   IR - G tube for tonsilar cancer  . IR ANGIO INTRA EXTRACRAN SEL COM CAROTID INNOMINATE UNI R MOD SED  03/09/2019  . IR ANGIO VERTEBRAL SEL VERTEBRAL UNI R MOD SED  03/09/2019  . IR CT HEAD LTD  03/09/2019  . IR PERCUTANEOUS ART THROMBECTOMY/INFUSION INTRACRANIAL INC DIAG ANGIO  03/09/2019  . IR RADIOLOGIST EVAL & MGMT  12/18/2018  . LAMINECTOMY     C5/placement of steel plate  . NECK SURGERY  2003   replaced disk  . RADIOLOGY WITH ANESTHESIA N/A 03/08/2019   Procedure: IR WITH ANESTHESIA;  Surgeon: Luanne Bras, MD;  Location: [facility];  Service: Radiology;  Laterality: N/A;  . TEE WITHOUT CARDIOVERSION N/A 10/15/2015   Procedure: TRANSESOPHAGEAL ECHOCARDIOGRAM (TEE);  Surgeon: Larey Dresser, MD;  Location: [facility];  Service: Cardiovascular;  Laterality: N/A;  . TEE WITHOUT CARDIOVERSION N/A 11/24/2015  Procedure: TRANSESOPHAGEAL ECHOCARDIOGRAM (TEE);  Surgeon: Josue Hector, MD;  Location: [facility];  Service: Cardiovascular;  Laterality: N/A;  . XI ROBOTIC ASSISTED COLOSTOMY TAKEDOWN N/A 03/07/2019   Procedure: XI ROBOTIC ASSISTED LOW ANTERIOR RESECTION, RIGID PROCTOSCOPY;  Surgeon: Michael Boston, MD;  [facility];  Service: General;  Laterality: N/A;    There were no vitals filed for this visit.  Subjective Assessment - 04/09/19 1012    Subjective  "Feeling pretty good, no pain" Pt reports that he might return to work in three  weeks    Pertinent History  Pt and wife are separated; pt staying with wife right now; tonsil cancer with radiation and chemotherapy in 2005, cardiomyopathy, atrial flutter, hypothyroidism, diverticulitis with abcess, CAD, and C5 laminectomy    Patient Stated Goals  Wants to get back to work at [employer]    Currently in Pain?  No/denies                       [facility] Adult PT Treatment/Exercise - 04/09/19 0001      Knee/Hip Exercises: Aerobic   Elliptical  I10 R 5 x3 min     Nustep  level 5 x 6 minutes      Knee/Hip Exercises: Machines for Strengthening   Cybex Knee Extension  5# 2x10    Cybex Knee Flexion  20# 2x10    Cybex Leg Press  30# 2x10, SL 20lb 2x5 each    Other Machine  25# seated row, lats pulls 25# 2x10, 10# shoulder press      Knee/Hip Exercises: Standing   Forward Step Up  Both;2 sets;10 reps;Hand Hold: 0;Step Height: 6"   7lb dumbbells alternating   Other Standing Knee Exercises  Functional box lifts 20lb 3x5     Other Standing Knee Exercises  35lb rows 2x10      Knee/Hip Exercises: Seated   Sit to Sand  2 sets;15 reps;without UE support   OHP with blue ball                  PT Long Term Goals - 04/02/19 1055      PT LONG TERM GOAL #1   Title  Pt will demonstrate independence with balance and strengthening HEP    Status  On-going            Plan - 04/09/19 1058    Clinical Impression Statement  Pt did well with a progressed treatment session. No issues with machine level interventions, but did have some fatigue with functional interventions. Postural cues needed with standing rows. No reports of pain, he did display some LLE weakness with single leg on leg press.    Comorbidities  tonsil cancer with radiation and chemotherapy in 2005, cardiomyopathy, atrial flutter, hypothyroidism, diverticulitis with abcess, CAD, and C5 laminectomy    Examination-Activity Limitations  Carry;Lift;Reach Overhead;Squat    Stability/Clinical Decision Making   Evolving/Moderate complexity    Rehab Potential  Good    PT Frequency  1x / week    PT Duration  6 weeks    PT Treatment/Interventions  ADLs/Self Care Home Management;Gait training;Stair training;Functional mobility training;Therapeutic activities;Therapeutic exercise;Balance training;Neuromuscular re-education;Patient/family education;Other (comment)    PT Next Visit Plan  increase as tolerated work toward job functions       Patient will benefit from skilled therapeutic intervention in order to improve the following deficits and impairments:     Visit Diagnosis: Dysphagia, oropharyngeal phase  Aphasia  Muscle weakness (generalized)  Problem List Patient Active Problem List   Diagnosis Date Noted  . Hypokalemia 03/12/2019  . Expressive aphasia 03/10/2019  . Dysphagia 03/10/2019  . Middle cerebral artery embolism, left 03/09/2019  . Diverticular stricture ([place]) 03/07/2019  . Bacteria in urine   . Chronic atrial fibrillation 12/02/2018  . Current use of long term anticoagulation 12/02/2018  . UTI (urinary tract infection) 11/29/2018  . Hyponatremia 11/29/2018  . PAF (paroxysmal atrial fibrillation) ([place]) 11/29/2018  . Diverticulitis of large intestine with abscess 09/23/2018  . Dyslipidemia 12/05/2017  . CAD (coronary artery disease), native coronary artery 12/06/2015  . SOB (shortness of breath)   . NICM (nonischemic cardiomyopathy) ([place])   . Thyroid activity decreased   . Hypothyroidism 08/12/2013  . History of radiation therapy 05/30/2012  . Smokes tobacco daily 05/30/2012  . Cancer of tonsillar fossa ([place]) 11/27/2011  . Tonsil cancer ([place]) 2005  . History of chemotherapy 2005    [place] 04/09/2019, 11:03 AM  [place], [place], [address] Phone: [phone]   Fax:  [phone]  Name: Adrian [name] MRN: [id number] Date of Birth: [birth year]

## 2019-04-10 ENCOUNTER — Ambulatory Visit: Payer: BC Managed Care – PPO

## 2019-04-10 ENCOUNTER — Ambulatory Visit: Payer: BC Managed Care – PPO | Admitting: Occupational Therapy

## 2019-04-10 DIAGNOSIS — R278 Other lack of coordination: Secondary | ICD-10-CM

## 2019-04-10 DIAGNOSIS — M6281 Muscle weakness (generalized): Secondary | ICD-10-CM

## 2019-04-10 DIAGNOSIS — R4701 Aphasia: Secondary | ICD-10-CM

## 2019-04-10 DIAGNOSIS — R1312 Dysphagia, oropharyngeal phase: Secondary | ICD-10-CM

## 2019-04-10 DIAGNOSIS — R41841 Cognitive communication deficit: Secondary | ICD-10-CM

## 2019-04-10 DIAGNOSIS — R471 Dysarthria and anarthria: Secondary | ICD-10-CM

## 2019-04-10 NOTE — Therapy (Signed)
[facility] [address] [place], [place], [address] Phone: [phone]   Fax:  [phone]  Occupational Therapy Treatment  Patient Details  Name: Adrian [name] MRN: [id number] Date of Birth: 1956-03-21 Referring Provider (OT): Carolann Littler   Encounter Date: 04/10/2019  OT End of Session - 04/10/19 1131    Visit Number  2    Number of Visits  9    Authorization Type  BC/BS    OT Start Time  1100    OT Stop Time  1140    OT Time Calculation (min)  40 min    Activity Tolerance  Patient tolerated treatment well    Behavior During Therapy  [facility] for tasks assessed/performed       Past Medical History:  Diagnosis Date  . AF (paroxysmal atrial fibrillation) ([place]) 11/29/2018  . Atrial flutter ([place]) 10/12/2015  . CAD (coronary artery disease), native coronary artery 12/06/2015   a. 10/2015 MV: EF  37%, reversible defect inferior apex, intermediate risk findings; b. 10/2015 Cath: 20% mid RCA.  . Colonic diverticular abscess   . Diverticulitis   . History of chemotherapy 2005   Cisplatin  . Hypothyroidism   . NICM (nonischemic cardiomyopathy) ([place]) 10/14/2015   a. Tachy mediated?;  b. Echo 3/17 - Mild concentric LVH, EF 30-35%, anteroseptal, anterior, anterolateral, apical anterior, lateral hypokinesis, trivial MR, mild to moderately reduced RVSF; c. LHC 3/17 - mRCA 20%  . Paroxysmal atrial flutter ([place])    a. TEE 3/17 with ? LAA clot-->s/p TEE/DCCV 11/24/2015  . Radiation NOv.3,2005-Dec. 15, 2005   6810 cGy in 30 fractions  . Tonsil cancer [facility]) 2005   Dr Romeo Rabon.  XRT    Past Surgical History:  Procedure Laterality Date  . APPENDECTOMY N/A 03/07/2019   Procedure: ROBOTIC ASSISTED APPENDECTOMY;  Surgeon: Michael Boston, MD;  [facility];  Service: General;  Laterality: N/A;  . CARDIAC CATHETERIZATION N/A 10/18/2015   Procedure: Left Heart Cath and Coronary Angiography;  Surgeon: Burnell Blanks, MD;  Location:  [place] [facility];  Service: Cardiovascular;  Laterality: N/A;  . CARDIOVERSION N/A 11/24/2015   Procedure: CARDIOVERSION;  Surgeon: Josue Hector, MD;  Location: [facility];  Service: Cardiovascular;  Laterality: N/A;  . CYSTOSCOPY WITH STENT PLACEMENT Bilateral 03/07/2019   Procedure: CYSTOSCOPY WITH BILATERAL FIREFLY INJECTION;  Surgeon: Ardis Hughs, MD;  [facility];  Service: Urology;  Laterality: Bilateral;  . GASTROSTOMY TUBE PLACEMENT  07/04/2004   IR - G tube for tonsilar cancer  . IR ANGIO INTRA EXTRACRAN SEL COM CAROTID INNOMINATE UNI R MOD SED  03/09/2019  . IR ANGIO VERTEBRAL SEL VERTEBRAL UNI R MOD SED  03/09/2019  . IR CT HEAD LTD  03/09/2019  . IR PERCUTANEOUS ART THROMBECTOMY/INFUSION INTRACRANIAL INC DIAG ANGIO  03/09/2019  . IR RADIOLOGIST EVAL & MGMT  12/18/2018  . LAMINECTOMY     C5/placement of steel plate  . NECK SURGERY  2003   replaced disk  . RADIOLOGY WITH ANESTHESIA N/A 03/08/2019   Procedure: IR WITH ANESTHESIA;  Surgeon: Luanne Bras, MD;  Location: [facility];  Service: Radiology;  Laterality: N/A;  . TEE WITHOUT CARDIOVERSION N/A 10/15/2015   Procedure: TRANSESOPHAGEAL ECHOCARDIOGRAM (TEE);  Surgeon: Larey Dresser, MD;  Location: [place];  Service: Cardiovascular;  Laterality: N/A;  . TEE WITHOUT CARDIOVERSION N/A 11/24/2015   Procedure: TRANSESOPHAGEAL ECHOCARDIOGRAM (TEE);  Surgeon: Josue Hector, MD;  Location: [facility];  Service: Cardiovascular;  Laterality: N/A;  .  XI ROBOTIC ASSISTED COLOSTOMY TAKEDOWN N/A 03/07/2019   Procedure: XI ROBOTIC ASSISTED LOW ANTERIOR RESECTION, RIGID PROCTOSCOPY;  Surgeon: Michael Boston, MD;  [facility];  Service: General;  Laterality: N/A;    There were no vitals filed for this visit.  Subjective Assessment - 04/10/19 1103    Subjective   I drove myself today    Pertinent History  Lt MCA CVA 03/08/19. PMH: A-FIB, CAD, h/o CA    Currently in Pain?  No/denies        Recommended no driving until MD  clears and therapy has more time to work with patient on environmental scanning and divided attn. Tracing over letters for coordination and formulation of letters Issued theraband and coordination HEP - see pt instructions for details. Pt performed each x 10 reps or as indicated                   OT Education - 04/10/19 1113    Education Details  theraband and coordination HEP    Person(s) Educated  Patient    Methods  Explanation;Demonstration;Handout    Comprehension  Verbalized understanding;Returned demonstration          OT Long Term Goals - 04/10/19 1131      OT LONG TERM GOAL #1   Title  Independent with HEP for Rt hand coordination and Rt shoulder strength    Time  4    Period  Weeks    Status  On-going      OT LONG TERM GOAL #2   Title  Improve coordination Rt hand to 25 sec. or less on 9 hole peg test    Baseline  27 sec    Time  4    Period  Weeks    Status  New      OT LONG TERM GOAL #3   Title  Pt to perform medication management w/ external aids/cues prn I'ly after placed in pillbox by family    Time  4    Period  Weeks    Status  New      OT LONG TERM GOAL #4   Title  Pt to lift up to 30 # BUE's and carrying short distance using proper body mechanics in prep for return to work    Time  4    Period  Weeks    Status  New            Plan - 04/10/19 1131    Clinical Impression Statement  Pt progressing towards LTG #1    Occupational performance deficits (Please refer to evaluation for details):  IADL's;Work    Body Structure / Function / Physical Skills  IADL;FMC;Coordination;Strength;UE functional use;Body mechanics;Vision;Mobility    Cognitive Skills  Memory    Rehab Potential  Good    OT Frequency  2x / week    OT Duration  4 weeks    OT Treatment/Interventions  Therapeutic exercise;Self-care/ADL training;Neuromuscular education;Therapeutic activities;Coping strategies training;Cognitive remediation/compensation;Visual/perceptual  remediation/compensation;Patient/family education;Passive range of motion    Plan  review HEP's prn, work simulation tasks, environmental scanning w/ physical task (if time - copy peg design)       Patient will benefit from skilled therapeutic intervention in order to improve the following deficits and impairments:   Body Structure / Function / Physical Skills: IADL, FMC, Coordination, Strength, UE functional use, Body mechanics, Vision, Mobility Cognitive Skills: Memory     Visit Diagnosis: Muscle weakness (generalized)  Other lack of coordination  Problem List Patient Active Problem List   Diagnosis Date Noted  . Hypokalemia 03/12/2019  . Expressive aphasia 03/10/2019  . Dysphagia 03/10/2019  . Middle cerebral artery embolism, left 03/09/2019  . Diverticular stricture ([place]) 03/07/2019  . Bacteria in urine   . Chronic atrial fibrillation 12/02/2018  . Current use of long term anticoagulation 12/02/2018  . UTI (urinary tract infection) 11/29/2018  . Hyponatremia 11/29/2018  . PAF (paroxysmal atrial fibrillation) ([place]) 11/29/2018  . Diverticulitis of large intestine with abscess 09/23/2018  . Dyslipidemia 12/05/2017  . CAD (coronary artery disease), native coronary artery 12/06/2015  . SOB (shortness of breath)   . NICM (nonischemic cardiomyopathy) ([place])   . Thyroid activity decreased   . Hypothyroidism 08/12/2013  . History of radiation therapy 05/30/2012  . Smokes tobacco daily 05/30/2012  . Cancer of tonsillar fossa ([place]) 11/27/2011  . Tonsil cancer ([place]) 2005  . History of chemotherapy 2005    Carey Bullocks, OTR/L 04/10/2019, 11:37 AM  [facility] [address] [place], [place], [address] Phone: [phone]   Fax:  [phone]  Name: Adrian [name] MRN: [id number] Date of Birth: [birth year]

## 2019-04-10 NOTE — Therapy (Signed)
[name] [address] [place], [place], [address] Phone: [phone]   Fax:  [phone]  Speech Language Pathology Treatment  Patient Details  Name: Adrian [name] MRN: [id number] Date of Birth: [birth year] Referring Provider (SLP): Adrian Post, MD (PCP) referred to therapy by NP at hospital   Encounter Date: 04/10/2019  End of Session - 04/10/19 0945    Visit Number  5    Number of Visits  17    Date for SLP Re-Evaluation  05/24/19    Authorization Type  BCBS    SLP Start Time  0935    SLP Stop Time   [id number]    SLP Time Calculation (min)  40 min    Activity Tolerance  Patient tolerated treatment well       Past Medical History:  Diagnosis Date  . AF (paroxysmal atrial fibrillation) ([place]) 11/29/2018  . Atrial flutter ([place]) 10/12/2015  . CAD (coronary artery disease), native coronary artery 12/06/2015   a. 10/2015 MV: EF  37%, reversible defect inferior apex, intermediate risk findings; b. 10/2015 Cath: 20% mid RCA.  . Colonic diverticular abscess   . Diverticulitis   . History of chemotherapy 2005   Cisplatin  . Hypothyroidism   . NICM (nonischemic cardiomyopathy) ([place]) 10/14/2015   a. Tachy mediated?;  b. Echo 3/17 - Mild concentric LVH, EF 30-35%, anteroseptal, anterior, anterolateral, apical anterior, lateral hypokinesis, trivial MR, mild to moderately reduced RVSF; c. LHC 3/17 - mRCA 20%  . Paroxysmal atrial flutter ([place])    a. TEE 3/17 with ? LAA clot-->s/p TEE/DCCV 11/24/2015  . Radiation NOv.3,2005-Dec. 15, 2005   6810 cGy in 30 fractions  . Tonsil cancer [name]) 2005   Dr Adrian [name].  XRT    Past Surgical History:  Procedure Laterality Date  . APPENDECTOMY N/A 03/07/2019   Procedure: ROBOTIC ASSISTED APPENDECTOMY;  Surgeon: Adrian Boston, MD;  [facility];  Service: General;  Laterality: N/A;  . CARDIAC CATHETERIZATION N/A 10/18/2015   Procedure: Left Heart Cath and Coronary Angiography;  Surgeon:  Adrian Blanks, MD;  Location: [place] CV LAB;  Service: Cardiovascular;  Laterality: N/A;  . CARDIOVERSION N/A 11/24/2015   Procedure: CARDIOVERSION;  Surgeon: Adrian Hector, MD;  Location: [place];  Service: Cardiovascular;  Laterality: N/A;  . CYSTOSCOPY WITH STENT PLACEMENT Bilateral 03/07/2019   Procedure: CYSTOSCOPY WITH BILATERAL FIREFLY INJECTION;  Surgeon: Adrian Hughs, MD;  [facility];  Service: Urology;  Laterality: Bilateral;  . GASTROSTOMY TUBE PLACEMENT  07/04/2004   IR - G tube for tonsilar cancer  . IR ANGIO INTRA EXTRACRAN SEL COM CAROTID INNOMINATE UNI R MOD SED  03/09/2019  . IR ANGIO VERTEBRAL SEL VERTEBRAL UNI R MOD SED  03/09/2019  . IR CT HEAD LTD  03/09/2019  . IR PERCUTANEOUS ART THROMBECTOMY/INFUSION INTRACRANIAL INC DIAG ANGIO  03/09/2019  . IR RADIOLOGIST EVAL & MGMT  12/18/2018  . LAMINECTOMY     C5/placement of steel plate  . NECK SURGERY  2003   replaced disk  . RADIOLOGY WITH ANESTHESIA N/A 03/08/2019   Procedure: IR WITH ANESTHESIA;  Surgeon: Adrian Bras, MD;  Location: [facility];  Service: Radiology;  Laterality: N/A;  . TEE WITHOUT CARDIOVERSION N/A 10/15/2015   Procedure: TRANSESOPHAGEAL ECHOCARDIOGRAM (TEE);  Surgeon: Adrian Dresser, MD;  Location: [facility];  Service: Cardiovascular;  Laterality: N/A;  . TEE WITHOUT CARDIOVERSION N/A 11/24/2015   Procedure: TRANSESOPHAGEAL ECHOCARDIOGRAM (TEE);  Surgeon: Adrian Hector, MD;  [facility];  Service: Cardiovascular;  Laterality: N/A;  . XI ROBOTIC ASSISTED COLOSTOMY TAKEDOWN N/A 03/07/2019   Procedure: XI ROBOTIC ASSISTED LOW ANTERIOR RESECTION, RIGID PROCTOSCOPY;  Surgeon: Adrian Boston, MD;  [facility];  Service: General;  Laterality: N/A;    There were no vitals filed for this visit.  Subjective Assessment - 04/10/19 0941    Subjective  "I drove myself to work today!"    Currently in Pain?  No/denies            ADULT SLP TREATMENT - 04/10/19 0941       General Information   Behavior/Cognition  Alert;Cooperative;Pleasant mood      Treatment Provided   Treatment provided  Cognitive-Linquistic      Cognitive-Linquistic Treatment   Treatment focused on  Cognition    Skilled Treatment  SLP initiated the Cognitive Linguistic Quick Test today, as pt stated he drove himself to therapy. Pt with diffiuclty with some task instructions due to aphasia. Clock drawing demonstrated decr'd awarenss      Assessment / Recommendations / Plan   Plan  Continue with current plan of care         SLP Short Term Goals - 04/10/19 1412      SLP SHORT TERM GOAL #1   Title  Pt will demo HEP for dysphagia with rare min A x 3 sessions.    Time  3    Period  Weeks    Status  On-going      SLP SHORT TERM GOAL #2   Title  Pt will complete further assessment of cognition and writing and goals added as appropriate, within first 4 visits.    Status  Achieved      SLP SHORT TERM GOAL #3   Title  Pt will utitlize multimodal communication/compensations for anomia in 8 minutes simple-mod complex conversation over 3 sessions with occasional min A    Time  3    Period  Weeks    Status  On-going       SLP Long Term Goals - 04/10/19 1413      SLP LONG TERM GOAL #1   Title  Pt will complete HEP for dysphagia with modified independence x 3 sessions.    Time  7    Period  Weeks    Status  On-going      SLP LONG TERM GOAL #2   Title  Pt will participate in repeat MBS for objective assessment of swallow function to determine readiness for diet advancement.    Time  7    Period  Weeks    Status  On-going      SLP LONG TERM GOAL #3   Title  Pt will utitlize multimodal communication/compensations for anomia in 12 minutes mod complex conversation over 3 sessions with occasional min A    Time  7    Period  Weeks    Status  On-going       Plan - 04/10/19 1023    Clinical Impression Statement  [name] cont to present with mild anomic aphasia, mild  dysarthria and oropharyngeal dysphagia. SLP began cognitive linguistc eval today and awareness and attention as cognitive deficits were ID'd. SLP told pt SLP does not think he should be driving at this time. PCP has not cleared pt to drive. See "skilled intervention" for more details of session. SLP upgraded pt to cup sips of "his nectar" (runny nectar) and careful adherence to cough/throat clear following multiple swallows of  liquid. I recommend cont'd skilled ST to address communication and swallowing impairments, in order to maximize pt's independence and safety, increase his ability to communicate wants and needs, and improve quality of life.    Speech Therapy Frequency  2x / week    Duration  --   8 weeks/17 visits   Treatment/Interventions  [place]iration precaution training;Diet toleration management by SLP;Environmental controls;Trials of upgraded texture/liquids;Cognitive reorganization;Oral motor exercises;Multimodal communcation approach;Compensatory strategies;Pharyngeal strengthening exercises;Language facilitation;Compensatory techniques;Cueing hierarchy;Internal/external aids;Functional tasks;SLP instruction and feedback;Patient/family education    Potential to Achieve Goals  Good    Consulted and Agree with Plan of Care  Patient       Patient will benefit from skilled therapeutic intervention in order to improve the following deficits and impairments:   Cognitive communication deficit  Aphasia  Dysphagia, oropharyngeal phase  Dysarthria and anarthria    Problem List Patient Active Problem List   Diagnosis Date Noted  . Hypokalemia 03/12/2019  . Expressive aphasia 03/10/2019  . Dysphagia 03/10/2019  . Middle cerebral artery embolism, left 03/09/2019  . Diverticular stricture ([place]) 03/07/2019  . Bacteria in urine   . Chronic atrial fibrillation 12/02/2018  . Current use of long term anticoagulation 12/02/2018  . UTI (urinary tract infection) 11/29/2018  . Hyponatremia  11/29/2018  . PAF (paroxysmal atrial fibrillation) ([place]) 11/29/2018  . Diverticulitis of large intestine with abscess 09/23/2018  . Dyslipidemia 12/05/2017  . CAD (coronary artery disease), native coronary artery 12/06/2015  . SOB (shortness of breath)   . NICM (nonischemic cardiomyopathy) ([place])   . Thyroid activity decreased   . Hypothyroidism 08/12/2013  . History of radiation therapy 05/30/2012  . Smokes tobacco daily 05/30/2012  . Cancer of tonsillar fossa ([place]) 11/27/2011  . Tonsil cancer ([place]) 2005  . History of chemotherapy 2005    [facility] ,[place], [place]  04/10/2019, 2:14 PM  [facility] [address] [place], [place], [address] Phone: [phone]   Fax:  [phone]   Name: Adrian [name] MRN: [id number] Date of Birth: 1955/09/12

## 2019-04-10 NOTE — Patient Instructions (Signed)
  Strengthening: Resisted Flexion   Hold tubing with _Rt____ arm(s) at side. Pull forward and up. Move shoulder through pain-free range of motion. Repeat __10__ times per set.  Do _1-2_ sessions per day , every other day   Strengthening: Resisted Extension   Hold tubing in _both____ hand(s), arm forward. Pull arms back, elbow straight. Repeat _10___ times per set. Do _1-2___ sessions per day, every other day.   Resisted Horizontal Abduction: Bilateral   Sit or stand, tubing in both hands, arms out in front. Keeping arms straight, pinch shoulder blades together and stretch arms out. Repeat _10___ times per set. Do _1-2___ sessions per day, every other day.   Elbow Flexion: Resisted   With tubing held in ___Rt___ hand(s) and other end secured under foot, curl arm up as far as possible. Repeat _10___ times per set. Do _1-2___ sessions per day, every other day.   Elbow Extension: Resisted   Sit in chair with resistive band secured at armrest (or hold with other hand) and ___Rt____ elbow bent. Straighten elbow. Repeat _10___ times per set.  Do _1-2___ sessions per day, every other day.    Coordination Activities  Perform the following activities for 10 minutes 1-2 times per day with right hand(s).   Rotate ball in fingertips (clockwise and counter-clockwise).  Deal cards with your thumb (Hold deck in hand and push card off top with thumb).  Rotate one card in hand (clockwise and counter-clockwise).  Pick up coins one at a time until you get 5 in your hand, then move coins from palm to fingertips to stack one at a time.  Practice writing.

## 2019-04-11 ENCOUNTER — Other Ambulatory Visit (INDEPENDENT_AMBULATORY_CARE_PROVIDER_SITE_OTHER): Payer: BC Managed Care – PPO

## 2019-04-11 ENCOUNTER — Other Ambulatory Visit: Payer: Self-pay

## 2019-04-11 DIAGNOSIS — E039 Hypothyroidism, unspecified: Secondary | ICD-10-CM

## 2019-04-11 LAB — TSH: TSH: 0.33 u[IU]/mL — ABNORMAL LOW (ref 0.35–4.50)

## 2019-04-15 ENCOUNTER — Telehealth: Payer: Self-pay | Admitting: Neurology

## 2019-04-15 ENCOUNTER — Ambulatory Visit: Payer: BC Managed Care – PPO | Admitting: Occupational Therapy

## 2019-04-15 ENCOUNTER — Encounter: Payer: Self-pay | Admitting: Neurology

## 2019-04-15 ENCOUNTER — Ambulatory Visit: Payer: BC Managed Care – PPO | Admitting: Speech Pathology

## 2019-04-15 ENCOUNTER — Other Ambulatory Visit: Payer: Self-pay

## 2019-04-15 ENCOUNTER — Ambulatory Visit: Payer: BC Managed Care – PPO | Admitting: Neurology

## 2019-04-15 VITALS — BP 114/74 | HR 71 | Temp 98.4°F | Ht 68.0 in | Wt 125.0 lb

## 2019-04-15 DIAGNOSIS — M6281 Muscle weakness (generalized): Secondary | ICD-10-CM

## 2019-04-15 DIAGNOSIS — I69318 Other symptoms and signs involving cognitive functions following cerebral infarction: Secondary | ICD-10-CM

## 2019-04-15 DIAGNOSIS — I6522 Occlusion and stenosis of left carotid artery: Secondary | ICD-10-CM

## 2019-04-15 DIAGNOSIS — R1312 Dysphagia, oropharyngeal phase: Secondary | ICD-10-CM

## 2019-04-15 DIAGNOSIS — R41841 Cognitive communication deficit: Secondary | ICD-10-CM

## 2019-04-15 DIAGNOSIS — R4701 Aphasia: Secondary | ICD-10-CM

## 2019-04-15 NOTE — Progress Notes (Signed)
[facility] [address] [place]. [place] [address] [phone]       OFFICE FOLLOW-UP NOTE  Adrian [name] Date of Birth:  1955/11/20 Medical Record Number:  [id number]   HPI: Adrian [name] is a 63 year old Caucasian male seen today for initial office follow-up visit following hospital admission for stroke in August 2020.  History is obtained from the patient, his ex wife, review of electronic medical records and have personally reviewed imaging films in PACS.  He has past medical history of coronary artery disease, tonsillar cancer in remission, atrial fibrillation on anticoagulation which was held 5 days ago prior to admission for diverticulitis of large intestine with abscess requiring exploratory laparotomy with low anterior resection, rigid proctoscopy and appendicectomy.  He was last seen normal on 03/08/2019 at 7:30 PM and was still on hospital and he developed sudden onset of speech difficulties and speech became nonsensical he had trouble expressing himself.  Code stroke was activated his NIH stroke scale was 2 on assessment there.  Noncontrast CT scan of the head was negative for bleed TPA was not given due to recent surgery 1 day prior.  CT angiogram of the head and neck was obtained which showed left cervical carotid occlusion as well as left MCA occlusion and CT perfusion showed acute cord infarct involving the anterior left MCA distribution with moderate surrounding ischemic penumbra favorable for revascularization.  Patient was transferred to [facility] where he underwent mechanical thrombectomy of the occluded left ICA with revascularization but only partial revascularization of the left MCA.  MRI scan of the brain showed moderate size left MCA infarct with trace petechial hemorrhage with decreased left ICA flow void.  2D echo showed normal ejection fraction.  LDL cholesterol 46 mg percent.  Hemoglobin A1c was 5.5.  Patient is started on Eliquis for his history of  atrial fibrillation.  He was discharged home with outpatient physical occupational and speech therapy.  Patient states he is continuing therapies but is currently getting only speech and occupational therapy 1 day a week.  He still has some word finding difficulties particularly towards afternoon when he is tired he struggles to speak.  He is currently out on short-term disability to October 23 from his diverticulitis.  He wants to go back to work and will be working part-time 6 hours/day.  He does not involve a lot of speaking and talking in his job.  He is tolerating Eliquis well without bruising or bleeding.  His blood pressure is well controlled and today it is 124/74.  He is tolerating Lipitor without muscle aches and pains.  Patient continues to smoke but states he is willing to quit soon.  He wants to return back to work on 05/25/2019. ROS:   14 system review of systems is positive for speech difficulties, word finding difficulties, increased fatigue, tiredness and all other systems negative  PMH:  Past Medical History:  Diagnosis Date   AF (paroxysmal atrial fibrillation) ([place]) 11/29/2018   Atrial flutter ([place]) 10/12/2015   CAD (coronary artery disease), native coronary artery 12/06/2015   a. 10/2015 MV: EF  37%, reversible defect inferior apex, intermediate risk findings; b. 10/2015 Cath: 20% mid RCA.   Colonic diverticular abscess    Diverticulitis    History of chemotherapy 2005   Cisplatin   Hypothyroidism    NICM (nonischemic cardiomyopathy) ([place]) 10/14/2015   a. Tachy mediated?;  b. Echo 3/17 - Mild concentric LVH, EF 30-35%, anteroseptal, anterior, anterolateral, apical anterior, lateral hypokinesis,  trivial MR, mild to moderately reduced RVSF; c. LHC 3/17 - mRCA 20%   Paroxysmal atrial flutter ([place])    a. TEE 3/17 with ? LAA clot-->s/p TEE/DCCV 11/24/2015   Radiation NOv.3,2005-Dec. 15, 2005   6810 cGy in 30 fractions   Tonsil cancer [facility]) 2005   Dr Romeo Rabon.  XRT     Social History:  Social History   Socioeconomic History   Marital status: Married    Spouse name: Not on file   Number of children: Not on file   Years of education: Not on file   Highest education level: Not on file  Occupational History   Not on file  Social Needs   Financial resource strain: Not on file   Food insecurity    Worry: Not on file    Inability: Not on file   Transportation needs    Medical: Not on file    Non-medical: Not on file  Tobacco Use   Smoking status: Current Every Day Smoker    Packs/day: 0.50    Years: 20.00    Pack years: 10.00    Types: Cigarettes   Smokeless tobacco: Never Used   Tobacco comment: 1/2 pack per day  Substance and Sexual Activity   Alcohol use: Yes    Alcohol/week: 12.0 standard drinks    Types: 12 Cans of beer per week    Comment: couple of beers each day   Drug use: No   Sexual activity: Not on file  Lifestyle   Physical activity    Days per week: Not on file    Minutes per session: Not on file   Stress: Not on file  Relationships   Social connections    Talks on phone: Not on file    Gets together: Not on file    Attends religious service: Not on file    Active member of club or organization: Not on file    Attends meetings of clubs or organizations: Not on file    Relationship status: Not on file   Intimate partner violence    Fear of current or ex partner: Not on file    Emotionally abused: Not on file    Physically abused: Not on file    Forced sexual activity: Not on file  Other Topics Concern   Not on file  Social History Narrative   Not on file    Medications:   Current Outpatient Medications on File Prior to Visit  Medication Sig Dispense Refill   acetaminophen (TYLENOL) 325 MG tablet Take 2 tablets (650 mg total) by mouth every 6 (six) hours as needed for mild pain (or Fever >/= 101).     apixaban (ELIQUIS) 5 MG TABS tablet Take 1 tablet (5 mg total) by mouth 2 (two) times  daily. 180 tablet 1   atorvastatin (LIPITOR) 40 MG tablet Take 1 tablet (40 mg total) by mouth daily. 90 tablet 3   levothyroxine (SYNTHROID) 125 MCG tablet TAKE 1 TABLET BY MOUTH EVERY DAY 90 tablet 0   Multiple Vitamin (MULTIVITAMIN WITH MINERALS) TABS tablet Take 1 tablet by mouth daily.     nicotine (NICODERM CQ) 14 mg/24hr patch After 21mg  patches are complete, apply 14mg  patch one daily for 2 weeks. Remove old patch before applying new one. (Patient not taking: Reported on 04/15/2019) 14 patch 0   No current facility-administered medications on file prior to visit.     Allergies:  No Known Allergies  Physical Exam General: well developed, well nourished,  seated, in no evident distress Head: head normocephalic and atraumatic.  Neck: supple with no carotid or supraclavicular bruits Cardiovascular: regular rate and rhythm, no murmurs Musculoskeletal: no deformity Skin:  no rash/petichiae Vascular:  Normal pulses all extremities Vitals:   04/15/19 1342  BP: 114/74  Pulse: 71  Temp: 98.4 F (36.9 C)   Neurologic Exam Mental Status: Awake and fully alert. Oriented to place and time. Recent and remote memory intact. Attention span, concentration and fund of knowledge appropriate. Mood and affect appropriate.  Mild expressive language difficulties with occasional word finding and naming difficulties.  Good comprehension.  No paraphasic errors. Cranial Nerves: Fundoscopic exam reveals sharp disc margins. Pupils equal, briskly reactive to light. Extraocular movements full without nystagmus. Visual fields full to confrontation. Hearing intact. Facial sensation intact. Face, tongue, palate moves normally and symmetrically.  Motor: Normal bulk and tone. Normal strength in all tested extremity muscles. Sensory.: intact to touch ,pinprick .position and vibratory sensation.  Coordination: Rapid alternating movements normal in all extremities. Finger-to-nose and heel-to-shin performed  accurately bilaterally. Gait and Station: Arises from chair without difficulty. Stance is normal. Gait demonstrates normal stride length and balance . Able to heel, toe and tandem walk without difficulty.  Reflexes: 1+ and symmetric. Toes downgoing.   NIHSS  1 Modified Rankin  2   ASSESSMENT: 63 year old Caucasian male with left MCA branch infarct in August 2020 secondary to left ICA occlusion treated with mechanical thrombectomy with partial revascularization.  Etiology of stroke atrial fibrillation and patient was off anticoagulation for recent surgery.  Vascular risk factors of smoking, hyperlipidemia and atrial fibrillation     PLAN: I had a long d/w patient about his recent stroke,left carotid occlusion, atrial fibrillation, risk for recurrent stroke/TIAs, personally independently reviewed imaging studies and stroke evaluation results and answered questions.Continue Eliquis (apixaban) daily  for secondary stroke prevention and maintain strict control of hypertension with blood pressure goal below 130/90, diabetes with hemoglobin A1c goal below 6.5% and lipids with LDL cholesterol goal below 70 mg/dL.I have counselled thepatient to quit smoking completely. Advise increase physical therapy to twice weekly for improving endurance prior to returning to work part time 6 hours/ day from 05/25/2019.  Patient is also cleared to drive.  I also advised the patient to eat a healthy diet with plenty of whole grains, cereals, fruits and vegetables, exercise regularly and maintain ideal body weight Followup in the future with my nurse practitioner Janett Billow in 3 months or call earlier if necessary. Greater than 50% of time during this 25 minute visit was spent on counseling,explanation of diagnosis, planning of further management, discussion with patient and family and coordination of care Antony Contras, MD  [facility] [address] [place], [place] [address]  Phone  [phone] Fax [phone] Note: This document was prepared with digital dictation and possible smart phrase technology. Any transcriptional errors that result from this process are unintentional

## 2019-04-15 NOTE — Therapy (Signed)
[facility] [address] [place], [place], [address] Phone: [phone]   Fax:  [phone]  Speech Language Pathology Treatment  Patient Details  Name: Adrian [name] MRN: [id number] Date of Birth: [birth year] Referring Provider (SLP): [name], MD (PCP) referred to therapy by NP at hospital   Encounter Date: 04/15/2019  End of Session - 04/15/19 1302    Visit Number  6    Number of Visits  17    Date for SLP Re-Evaluation  05/24/19    Authorization Type  BCBS    SLP Start Time  1146    SLP Stop Time   1233    SLP Time Calculation (min)  47 min    Activity Tolerance  Patient tolerated treatment well       Past Medical History:  Diagnosis Date  . AF (paroxysmal atrial fibrillation) ([place]) 11/29/2018  . Atrial flutter ([place]) 10/12/2015  . CAD (coronary artery disease), native coronary artery 12/06/2015   a. 10/2015 MV: EF  37%, reversible defect inferior apex, intermediate risk findings; b. 10/2015 Cath: 20% mid RCA.  . Colonic diverticular abscess   . Diverticulitis   . History of chemotherapy 2005   Cisplatin  . Hypothyroidism   . NICM (nonischemic cardiomyopathy) ([place]) 10/14/2015   a. Tachy mediated?;  b. Echo 3/17 - Mild concentric LVH, EF 30-35%, anteroseptal, anterior, anterolateral, apical anterior, lateral hypokinesis, trivial MR, mild to moderately reduced RVSF; c. LHC 3/17 - mRCA 20%  . Paroxysmal atrial flutter ([place])    a. TEE 3/17 with ? LAA clot-->s/p TEE/DCCV 11/24/2015  . Radiation NOv.3,2005-Dec. 15, 2005   6810 cGy in 30 fractions  . Tonsil cancer [facility]) 2005   Dr Romeo Rabon.  XRT    Past Surgical History:  Procedure Laterality Date  . APPENDECTOMY N/A 03/07/2019   Procedure: ROBOTIC ASSISTED APPENDECTOMY;  Surgeon: Michael Boston, MD;  Location: [facility];  Service: General;  Laterality: N/A;  . CARDIAC CATHETERIZATION N/A 10/18/2015   Procedure: Left Heart Cath and Coronary Angiography;  Surgeon:  Burnell Blanks, MD;  Location: [facility];  Service: Cardiovascular;  Laterality: N/A;  . CARDIOVERSION N/A 11/24/2015   Procedure: CARDIOVERSION;  Surgeon: Josue Hector, MD;  Location: [facility];  Service: Cardiovascular;  Laterality: N/A;  . CYSTOSCOPY WITH STENT PLACEMENT Bilateral 03/07/2019   Procedure: CYSTOSCOPY WITH BILATERAL FIREFLY INJECTION;  Surgeon: Ardis Hughs, MD;  Location: [facility];  Service: Urology;  Laterality: Bilateral;  . GASTROSTOMY TUBE PLACEMENT  07/04/2004   IR - G tube for tonsilar cancer  . IR ANGIO INTRA EXTRACRAN SEL COM CAROTID INNOMINATE UNI R MOD SED  03/09/2019  . IR ANGIO VERTEBRAL SEL VERTEBRAL UNI R MOD SED  03/09/2019  . IR CT HEAD LTD  03/09/2019  . IR PERCUTANEOUS ART THROMBECTOMY/INFUSION INTRACRANIAL INC DIAG ANGIO  03/09/2019  . IR RADIOLOGIST EVAL & MGMT  12/18/2018  . LAMINECTOMY     C5/placement of steel plate  . NECK SURGERY  2003   replaced disk  . RADIOLOGY WITH ANESTHESIA N/A 03/08/2019   Procedure: IR WITH ANESTHESIA;  Surgeon: Luanne Bras, MD;  Location: [facility];  Service: Radiology;  Laterality: N/A;  . TEE WITHOUT CARDIOVERSION N/A 10/15/2015   Procedure: TRANSESOPHAGEAL ECHOCARDIOGRAM (TEE);  Surgeon: Larey Dresser, MD;  Location: [facility];  Service: Cardiovascular;  Laterality: N/A;  . TEE WITHOUT CARDIOVERSION N/A 11/24/2015   Procedure: TRANSESOPHAGEAL ECHOCARDIOGRAM (TEE);  Surgeon: Josue Hector, MD;  Location:  [facility];  Service: Cardiovascular;  Laterality: N/A;  . XI ROBOTIC ASSISTED COLOSTOMY TAKEDOWN N/A 03/07/2019   Procedure: XI ROBOTIC ASSISTED LOW ANTERIOR RESECTION, RIGID PROCTOSCOPY;  Surgeon: Michael Boston, MD;  Location: [facility];  Service: General;  Laterality: N/A;    There were no vitals filed for this visit.  Subjective Assessment - 04/15/19 1149    Subjective  "The speech is a problem."    Currently in Pain?  No/denies            ADULT SLP TREATMENT - 04/15/19 1150       General Information   Behavior/Cognition  Alert;Cooperative;Pleasant mood      Treatment Provided   Treatment provided  Cognitive-Linquistic;Dysphagia      Cognitive-Linquistic Treatment   Treatment focused on  Cognition;Aphasia    Skilled Treatment  SLP completed Cognitive-Linguistic Quick Test with pt, which revealed mild deficits in non-linguisitic cognition and mild deficit in language. SLP feels attention/attention to detail, awareness and executive function are reduced. Pt struggled with mazes task, clock drawing, and design memory (feel his attention impacts this more so than memory). SLP educated pt re: recommendation not to drive at this time but await clearance from MD. In simple-mod complex conversation (pt describing what his modified job duties would be), pt's language was grossly functional, though mildly circumlocutory. Pt told SLP 3 compensations for anomia/aphasia. Pt stated ex-wife is handling phone calls related to medical needs/appointments at this time but he would like to be able to do this eventually. SLP provided compensations for phone calls handout with recommendations for pt. Home tasks assigned.      Assessment / Recommendations / Plan   Plan  Goals updated      Progression Toward Goals   Progression toward goals  Progressing toward goals       SLP Education - 04/15/19 1302    Education Details  results of cognitive-linguistic testing, deficit areas and functional implications; do not drive without MD approval    Person(s) Educated  Patient    Methods  Explanation;Handout    Comprehension  Verbalized understanding       SLP Short Term Goals - 04/15/19 1305      SLP SHORT TERM GOAL #1   Title  Pt will demo HEP for dysphagia with rare min A x 3 sessions.    Time  2    Period  Weeks    Status  On-going      SLP SHORT TERM GOAL #2   Title  Pt will complete further assessment of cognition and writing and goals added as appropriate, within first 4 visits.     Status  Achieved      SLP SHORT TERM GOAL #3   Title  Pt will utitlize multimodal communication/compensations for anomia in 8 minutes simple-mod complex conversation over 3 sessions with occasional min A    Baseline  04/15/19    Time  2    Period  Weeks    Status  On-going      SLP SHORT TERM GOAL #4   Title  Pt will alternate attention between 2 functional tasks for 12 minutes >90% accuracy x 2 sessions with compensations allowed.    Time  4    Period  Weeks    Status  New       SLP Long Term Goals - 04/15/19 1307      SLP LONG TERM GOAL #1   Title  Pt will complete HEP for dysphagia with modified  independence x 3 sessions.    Time  6    Period  Weeks    Status  On-going      SLP LONG TERM GOAL #2   Title  Pt will participate in repeat MBS for objective assessment of swallow function to determine readiness for diet advancement.    Time  6    Period  Weeks    Status  On-going      SLP LONG TERM GOAL #3   Title  Pt will utitlize multimodal communication/compensations for anomia in 12 minutes mod complex conversation over 3 sessions with occasional min A    Time  6    Period  Weeks    Status  On-going      SLP LONG TERM GOAL #4   Title  Pt will demo functional divided attention for 15 minutes x2 sessions, >95% accuracy on tasks with double checking allowed, x2 sessions.    Time  6    Period  Weeks    Status  New       Plan - 04/15/19 1303    Clinical Impression Statement  Adrian [name] cont to present with mild anomic aphasia, mild dysarthria and oropharyngeal dysphagia. SLP completed cognitive linguistic eval today and awareness, attention, executive function as cognitive deficits were ID'd. Cognitive goals added. SLP told pt SLP does not think he should be driving at this time. PCP has not cleared pt to drive; has neuro appt this afternoon. See "skilled intervention" for more details of session.  I recommend cont'd skilled ST to address communication and swallowing  impairments, in order to maximize pt's independence and safety, increase his ability to communicate wants and needs, and improve quality of life.    Speech Therapy Frequency  2x / week    Duration  --   8 weeks/17 visits   Treatment/Interventions  Aspiration precaution training;Diet toleration management by SLP;Environmental controls;Trials of upgraded texture/liquids;Cognitive reorganization;Oral motor exercises;Multimodal communcation approach;Compensatory strategies;Pharyngeal strengthening exercises;Language facilitation;Compensatory techniques;Cueing hierarchy;Internal/external aids;Functional tasks;SLP instruction and feedback;Patient/family education    Potential to Achieve Goals  Good    Consulted and Agree with Plan of Care  Patient       Patient will benefit from skilled therapeutic intervention in order to improve the following deficits and impairments:   Cognitive communication deficit  Aphasia  Dysphagia, oropharyngeal phase    Problem List Patient Active Problem List   Diagnosis Date Noted  . Hypokalemia 03/12/2019  . Expressive aphasia 03/10/2019  . Dysphagia 03/10/2019  . Middle cerebral artery embolism, left 03/09/2019  . Diverticular stricture ([place]) 03/07/2019  . Bacteria in urine   . Chronic atrial fibrillation 12/02/2018  . Current use of long term anticoagulation 12/02/2018  . UTI (urinary tract infection) 11/29/2018  . Hyponatremia 11/29/2018  . PAF (paroxysmal atrial fibrillation) ([place]) 11/29/2018  . Diverticulitis of large intestine with abscess 09/23/2018  . Dyslipidemia 12/05/2017  . CAD (coronary artery disease), native coronary artery 12/06/2015  . SOB (shortness of breath)   . NICM (nonischemic cardiomyopathy) ([place])   . Thyroid activity decreased   . Hypothyroidism 08/12/2013  . History of radiation therapy 05/30/2012  . Smokes tobacco daily 05/30/2012  . Cancer of tonsillar fossa ([place]) 11/27/2011  . Tonsil cancer ([place]) 2005  . History of  chemotherapy 2005   Deneise Lever, MS, CCC-SLP Speech-Language Pathologist  Aliene Altes 04/15/2019, 1:16 PM  [facility] [address] [place], [place], [address] Phone: [phone]   Fax:  [phone]   Name: Adrian [name] MRN: [id number] Date of Birth: [birth year]

## 2019-04-15 NOTE — Patient Instructions (Signed)
Tips for making phone calls  -Start with a "low pressure" call.  -Plan and write down information so that it is "at the ready" (your birthday, address, phone number, any questions you want to ask) -Have someone nearby who can take over for you if you need help. -You might also consider alerting the person on the phone at the beginning of the call: "I have trouble with my speech. Please be patient with me. I can understand, it just takes me extra time to get my words out sometimes."  Your testing today shows "mild" language and "mild non-linguistic cognition" scores. We will add a goal to work on your attention and executive function skills. Continue to recommend that you do not drive at this time, await clearance from your doctor.

## 2019-04-15 NOTE — Patient Instructions (Addendum)
I had a long d/w patient about his recent stroke,left carotid occlusion, atrial fibrillation, risk for recurrent stroke/TIAs, personally independently reviewed imaging studies and stroke evaluation results and answered questions.Continue Eliquis (apixaban) daily  for secondary stroke prevention and maintain strict control of hypertension with blood pressure goal below 130/90, diabetes with hemoglobin A1c goal below 6.5% and lipids with LDL cholesterol goal below 70 mg/dL.I have counselled thepatient to quit smoking completely. Advise increase physical therapy to twice weekly for improving endurance prior to returning to work part time 6 hours/ day from 05/25/2019. I also advised the patient to eat a healthy diet with plenty of whole grains, cereals, fruits and vegetables, exercise regularly and maintain ideal body weight Followup in the future with my nurse practitioner Janett Billow in 3 months or call earlier if necessary.  Stroke Prevention Some medical conditions and behaviors are associated with a higher chance of having a stroke. You can help prevent a stroke by making nutrition, lifestyle, and other changes, including managing any medical conditions you may have. What nutrition changes can be made?   Eat healthy foods. You can do this by: ? Choosing foods high in fiber, such as fresh fruits and vegetables and whole grains. ? Eating at least 5 or more servings of fruits and vegetables a day. Try to fill half of your plate at each meal with fruits and vegetables. ? Choosing lean protein foods, such as lean cuts of meat, poultry without skin, fish, tofu, beans, and nuts. ? Eating low-fat dairy products. ? Avoiding foods that are high in salt (sodium). This can help lower blood pressure. ? Avoiding foods that have saturated fat, trans fat, and cholesterol. This can help prevent high cholesterol. ? Avoiding processed and premade foods.  Follow your health care provider's specific guidelines for losing  weight, controlling high blood pressure (hypertension), lowering high cholesterol, and managing diabetes. These may include: ? Reducing your daily calorie intake. ? Limiting your daily sodium intake to 1,500 milligrams (mg). ? Using only healthy fats for cooking, such as olive oil, canola oil, or sunflower oil. ? Counting your daily carbohydrate intake. What lifestyle changes can be made?  Maintain a healthy weight. Talk to your health care provider about your ideal weight.  Get at least 30 minutes of moderate physical activity at least 5 days a week. Moderate activity includes brisk walking, biking, and swimming.  Do not use any products that contain nicotine or tobacco, such as cigarettes and e-cigarettes. If you need help quitting, ask your health care provider. It may also be helpful to avoid exposure to secondhand smoke.  Limit alcohol intake to no more than 1 drink a day for nonpregnant women and 2 drinks a day for men. One drink equals 12 oz of beer, 5 oz of wine, or 1 oz of hard liquor.  Stop any illegal drug use.  Avoid taking birth control pills. Talk to your health care provider about the risks of taking birth control pills if: ? You are over 2 years old. ? You smoke. ? You get migraines. ? You have ever had a blood clot. What other changes can be made?  Manage your cholesterol levels. ? Eating a healthy diet is important for preventing high cholesterol. If cholesterol cannot be managed through diet alone, you may also need to take medicines. ? Take any prescribed medicines to control your cholesterol as told by your health care provider.  Manage your diabetes. ? Eating a healthy diet and exercising regularly are important parts  of managing your blood sugar. If your blood sugar cannot be managed through diet and exercise, you may need to take medicines. ? Take any prescribed medicines to control your diabetes as told by your health care provider.  Control your  hypertension. ? To reduce your risk of stroke, try to keep your blood pressure below 130/80. ? Eating a healthy diet and exercising regularly are an important part of controlling your blood pressure. If your blood pressure cannot be managed through diet and exercise, you may need to take medicines. ? Take any prescribed medicines to control hypertension as told by your health care provider. ? Ask your health care provider if you should monitor your blood pressure at home. ? Have your blood pressure checked every year, even if your blood pressure is normal. Blood pressure increases with age and some medical conditions.  Get evaluated for sleep disorders (sleep apnea). Talk to your health care provider about getting a sleep evaluation if you snore a lot or have excessive sleepiness.  Take over-the-counter and prescription medicines only as told by your health care provider. Aspirin or blood thinners (antiplatelets or anticoagulants) may be recommended to reduce your risk of forming blood clots that can lead to stroke.  Make sure that any other medical conditions you have, such as atrial fibrillation or atherosclerosis, are managed. What are the warning signs of a stroke? The warning signs of a stroke can be easily remembered as BEFAST.  B is for balance. Signs include: ? Dizziness. ? Loss of balance or coordination. ? Sudden trouble walking.  E is for eyes. Signs include: ? A sudden change in vision. ? Trouble seeing.  F is for face. Signs include: ? Sudden weakness or numbness of the face. ? The face or eyelid drooping to one side.  A is for arms. Signs include: ? Sudden weakness or numbness of the arm, usually on one side of the body.  S is for speech. Signs include: ? Trouble speaking (aphasia). ? Trouble understanding.  T is for time. ? These symptoms may represent a serious problem that is an emergency. Do not wait to see if the symptoms will go away. Get medical help right  away. Call your local emergency services (911 in the [place] Do not drive yourself to the hospital.  Other signs of stroke may include: ? A sudden, severe headache with no known cause. ? Nausea or vomiting. ? Seizure. Where to find more information For more information, visit:  American Stroke Association: www.strokeassociation.org  National Stroke Association: www.stroke.org Summary  You can prevent a stroke by eating healthy, exercising, not smoking, limiting alcohol intake, and managing any medical conditions you may have.  Do not use any products that contain nicotine or tobacco, such as cigarettes and e-cigarettes. If you need help quitting, ask your health care provider. It may also be helpful to avoid exposure to secondhand smoke.  Remember BEFAST for warning signs of stroke. Get help right away if you or a loved one has any of these signs. This information is not intended to replace advice given to you by your health care provider. Make sure you discuss any questions you have with your health care provider. Document Released: 08/31/2004 Document Revised: 07/06/2017 Document Reviewed: 08/29/2016 Elsevier Patient Education  2020 [employer].

## 2019-04-15 NOTE — Therapy (Signed)
[facility] [address] [place], [place], [address] Phone: [phone]   Fax:  [phone]  Occupational Therapy Treatment  Patient Details  Name: Adrian [name] MRN: [id number] Date of Birth: February 18, 1956 Referring Provider (OT): Carolann Littler   Encounter Date: 04/15/2019  OT End of Session - 04/15/19 1011    Visit Number  3    Number of Visits  9    Authorization Type  BC/BS    OT Start Time  0933    OT Stop Time  1015    OT Time Calculation (min)  42 min    Activity Tolerance  Patient tolerated treatment well    Behavior During Therapy  [facility] for tasks assessed/performed       Past Medical History:  Diagnosis Date  . AF (paroxysmal atrial fibrillation) ([place]) 11/29/2018  . Atrial flutter ([place]) 10/12/2015  . CAD (coronary artery disease), native coronary artery 12/06/2015   a. 10/2015 MV: EF  37%, reversible defect inferior apex, intermediate risk findings; b. 10/2015 Cath: 20% mid RCA.  . Colonic diverticular abscess   . Diverticulitis   . History of chemotherapy 2005   Cisplatin  . Hypothyroidism   . NICM (nonischemic cardiomyopathy) ([place]) 10/14/2015   a. Tachy mediated?;  b. Echo 3/17 - Mild concentric LVH, EF 30-35%, anteroseptal, anterior, anterolateral, apical anterior, lateral hypokinesis, trivial MR, mild to moderately reduced RVSF; c. LHC 3/17 - mRCA 20%  . Paroxysmal atrial flutter ([place])    a. TEE 3/17 with ? LAA clot-->s/p TEE/DCCV 11/24/2015  . Radiation NOv.3,2005-Dec. 15, 2005   6810 cGy in 30 fractions  . 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  XRT    Past Surgical History:  Procedure Laterality Date  . APPENDECTOMY N/A 03/07/2019   Procedure: ROBOTIC ASSISTED APPENDECTOMY;  Surgeon: Michael Boston, MD;  [facility];  Service: General;  Laterality: N/A;  . CARDIAC CATHETERIZATION N/A 10/18/2015   Procedure: Left Heart Cath and Coronary Angiography;  Surgeon: Burnell Blanks, MD;  Location:  [facility];  Service: Cardiovascular;  Laterality: N/A;  . CARDIOVERSION N/A 11/24/2015   Procedure: CARDIOVERSION;  Surgeon: Josue Hector, MD;  Location: [facility];  Service: Cardiovascular;  Laterality: N/A;  . CYSTOSCOPY WITH STENT PLACEMENT Bilateral 03/07/2019   Procedure: CYSTOSCOPY WITH BILATERAL FIREFLY INJECTION;  Surgeon: Ardis Hughs, MD;  [facility];  Service: Urology;  Laterality: Bilateral;  . GASTROSTOMY TUBE PLACEMENT  07/04/2004   IR - G tube for tonsilar cancer  . IR ANGIO INTRA EXTRACRAN SEL COM CAROTID INNOMINATE UNI R MOD SED  03/09/2019  . IR ANGIO VERTEBRAL SEL VERTEBRAL UNI R MOD SED  03/09/2019  . IR CT HEAD LTD  03/09/2019  . IR PERCUTANEOUS ART THROMBECTOMY/INFUSION INTRACRANIAL INC DIAG ANGIO  03/09/2019  . IR RADIOLOGIST EVAL & MGMT  12/18/2018  . LAMINECTOMY     C5/placement of steel plate  . NECK SURGERY  2003   replaced disk  . RADIOLOGY WITH ANESTHESIA N/A 03/08/2019   Procedure: IR WITH ANESTHESIA;  Surgeon: Luanne Bras, MD;  Location: [facility];  Service: Radiology;  Laterality: N/A;  . TEE WITHOUT CARDIOVERSION N/A 10/15/2015   Procedure: TRANSESOPHAGEAL ECHOCARDIOGRAM (TEE);  Surgeon: Larey Dresser, MD;  Location: [facility];  Service: Cardiovascular;  Laterality: N/A;  . TEE WITHOUT CARDIOVERSION N/A 11/24/2015   Procedure: TRANSESOPHAGEAL ECHOCARDIOGRAM (TEE);  Surgeon: Josue Hector, MD;  Location: [facility];  Service: Cardiovascular;  Laterality: N/A;  .  XI ROBOTIC ASSISTED COLOSTOMY TAKEDOWN N/A 03/07/2019   Procedure: XI ROBOTIC ASSISTED LOW ANTERIOR RESECTION, RIGID PROCTOSCOPY;  Surgeon: Michael Boston, MD;  [facility];  Service: General;  Laterality: N/A;    There were no vitals filed for this visit.  Subjective Assessment - 04/15/19 0935    Subjective   I drove myself again today. 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 PMH: A-FIB, CAD, h/o CA    Currently in Pain?  No/denies       Copying peg design at 100% accuracy (except mistaking purple for blue).  Environmental scanning while tossing ball w/ 10/16 found on 1st pass. Pt found 3/6 remaining on Lt side 2nd pass. Pt missed 3 (1 upper Rt, 2 lower Rt) UBE x 5 min. Level 8 Simulated work tasks lifting up to 25 # box from floor x 5, carrying box while walking 150 ft,  turning over and moving box along table. Wall push ups x 10 reps. Walking and carrying 5 # weights each hand.                         OT Long Term Goals - 04/10/19 1131      OT LONG TERM GOAL #1   Title  Independent with HEP for Rt hand coordination and Rt shoulder strength    Time  4    Period  Weeks    Status  On-going      OT LONG TERM GOAL #2   Title  Improve coordination Rt hand to 25 sec. or less on 9 hole peg test    Baseline  27 sec    Time  4    Period  Weeks    Status  New      OT LONG TERM GOAL #3   Title  Pt to perform medication management w/ external aids/cues prn I'ly after placed in pillbox by family    Time  4    Period  Weeks    Status  New      OT LONG TERM GOAL #4   Title  Pt to lift up to 50 # BUE's and carrying short distance using proper body mechanics in prep for return to work    Time  4    Period  Weeks    Status  New            Plan - 04/15/19 1011    Clinical Impression Statement  Pt progressing with function and progress towards work related tasks    Occupational performance deficits (Please refer to evaluation for details):  IADL's;Work    Body Structure / Function / Physical Skills  IADL;FMC;Coordination;Strength;UE functional use;Body mechanics;Vision;Mobility    Cognitive Skills  Memory    OT Frequency  2x / week    OT Duration  4 weeks    OT Treatment/Interventions  Therapeutic exercise;Self-care/ADL training;Neuromuscular education;Therapeutic activities;Coping strategies  training;Cognitive remediation/compensation;Visual/perceptual remediation/compensation;Patient/family education;Passive range of motion    Plan  continue strengthening ( try 30-35# box), environmental scanning, memory    Consulted and Agree with Plan of Care  Patient       Patient will benefit from skilled therapeutic intervention in order to improve the following deficits and impairments:   Body Structure / Function / Physical Skills: IADL, FMC, Coordination, Strength,  UE functional use, Body mechanics, Vision, Mobility Cognitive Skills: Memory     Visit Diagnosis: Muscle weakness (generalized)  Other symptoms and signs involving cognitive functions following cerebral infarction    Problem List Patient Active Problem List   Diagnosis Date Noted  . Hypokalemia 03/12/2019  . Expressive aphasia 03/10/2019  . Dysphagia 03/10/2019  . Middle cerebral artery embolism, left 03/09/2019  . Diverticular stricture ([place]) 03/07/2019  . Bacteria in urine   . Chronic atrial fibrillation 12/02/2018  . Current use of long term anticoagulation 12/02/2018  . UTI (urinary tract infection) 11/29/2018  . Hyponatremia 11/29/2018  . PAF (paroxysmal atrial fibrillation) ([place]) 11/29/2018  . Diverticulitis of large intestine with abscess 09/23/2018  . Dyslipidemia 12/05/2017  . CAD (coronary artery disease), native coronary artery 12/06/2015  . SOB (shortness of breath)   . NICM (nonischemic cardiomyopathy) ([place])   . Thyroid activity decreased   . Hypothyroidism 08/12/2013  . History of radiation therapy 05/30/2012  . Smokes tobacco daily 05/30/2012  . Cancer of tonsillar fossa ([place]) 11/27/2011  . Tonsil cancer ([place]) 2005  . History of chemotherapy 2005    Carey Bullocks, OTR/L 04/15/2019, 10:14 AM  [facility] [address] [place], [place], [address] Phone: [phone]   Fax:  [phone]  Name: SHAWNE BRESLER MRN:  [id number] Date of Birth: 14-Feb-1956

## 2019-04-15 NOTE — Telephone Encounter (Signed)
Pt wife(on DPR)has called asking if RN Katrina will ask for a letter from Dr Leonie Man as it relates to driving.  She wants a letter that pt can keep in his car from his Neurologist stating his neurological state as a result of a stroke.  Wife is asking for this letter in the event that pt is pulled over the letter will  Verify that pt is not in a drunken state based on speech or the way in which he walks.  Please call

## 2019-04-15 NOTE — Telephone Encounter (Signed)
Ok to do so

## 2019-04-16 ENCOUNTER — Ambulatory Visit: Payer: BC Managed Care – PPO | Admitting: Physical Therapy

## 2019-04-16 ENCOUNTER — Encounter: Payer: Self-pay | Admitting: Physical Therapy

## 2019-04-16 ENCOUNTER — Telehealth: Payer: Self-pay

## 2019-04-16 DIAGNOSIS — M6281 Muscle weakness (generalized): Secondary | ICD-10-CM

## 2019-04-16 DIAGNOSIS — R1312 Dysphagia, oropharyngeal phase: Secondary | ICD-10-CM | POA: Diagnosis not present

## 2019-04-16 DIAGNOSIS — Z0289 Encounter for other administrative examinations: Secondary | ICD-10-CM

## 2019-04-16 NOTE — Telephone Encounter (Signed)
Letter done and signed by Dr.SEthi. Given to medical records to mailed to patient.

## 2019-04-16 NOTE — Therapy (Signed)
[facility] [place], [place], [address] Phone: [phone]   Fax:  [phone]  Physical Therapy Treatment  Patient Details  Name: Adrian [name] MRN: [id number] Date of Birth: [birth year] Referring Provider (PT): [name], MD (PCP) referred to therapy by NP at hospital   Encounter Date: 04/16/2019  PT End of Session - 04/16/19 1054    Visit Number  4    Date for PT Re-Evaluation  05/11/19    Authorization Type  BCBS    PT Start Time  1012    PT Stop Time  1055    PT Time Calculation (min)  43 min    Activity Tolerance  Patient tolerated treatment well    Behavior During Therapy  [facility] for tasks assessed/performed       Past Medical History:  Diagnosis Date  . AF (paroxysmal atrial fibrillation) ([place]) 11/29/2018  . Atrial flutter ([place]) 10/12/2015  . CAD (coronary artery disease), native coronary artery 12/06/2015   a. 10/2015 MV: EF  37%, reversible defect inferior apex, intermediate risk findings; b. 10/2015 Cath: 20% mid RCA.  . Colonic diverticular abscess   . Diverticulitis   . History of chemotherapy 2005   Cisplatin  . Hypothyroidism   . NICM (nonischemic cardiomyopathy) ([place]) 10/14/2015   a. Tachy mediated?;  b. Echo 3/17 - Mild concentric LVH, EF 30-35%, anteroseptal, anterior, anterolateral, apical anterior, lateral hypokinesis, trivial MR, mild to moderately reduced RVSF; c. LHC 3/17 - mRCA 20%  . Paroxysmal atrial flutter ([place])    a. TEE 3/17 with ? LAA clot-->s/p TEE/DCCV 11/24/2015  . Radiation NOv.3,2005-Dec. 15, 2005   6810 cGy in 30 fractions  . 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  XRT    Past Surgical History:  Procedure Laterality Date  . APPENDECTOMY N/A 03/07/2019   Procedure: ROBOTIC ASSISTED APPENDECTOMY;  Surgeon: Michael Boston, MD;  [facility];  Service: General;  Laterality: N/A;  . CARDIAC CATHETERIZATION N/A 10/18/2015   Procedure: Left Heart Cath and Coronary  Angiography;  Surgeon: Burnell Blanks, MD;  Location: [facility];  Service: Cardiovascular;  Laterality: N/A;  . CARDIOVERSION N/A 11/24/2015   Procedure: CARDIOVERSION;  Surgeon: Josue Hector, MD;  Location: [facility];  Service: Cardiovascular;  Laterality: N/A;  . CYSTOSCOPY WITH STENT PLACEMENT Bilateral 03/07/2019   Procedure: CYSTOSCOPY WITH BILATERAL FIREFLY INJECTION;  Surgeon: Ardis Hughs, MD;  [facility];  Service: Urology;  Laterality: Bilateral;  . GASTROSTOMY TUBE PLACEMENT  07/04/2004   IR - G tube for tonsilar cancer  . IR ANGIO INTRA EXTRACRAN SEL COM CAROTID INNOMINATE UNI R MOD SED  03/09/2019  . IR ANGIO VERTEBRAL SEL VERTEBRAL UNI R MOD SED  03/09/2019  . IR CT HEAD LTD  03/09/2019  . IR PERCUTANEOUS ART THROMBECTOMY/INFUSION INTRACRANIAL INC DIAG ANGIO  03/09/2019  . IR RADIOLOGIST EVAL & MGMT  12/18/2018  . LAMINECTOMY     C5/placement of steel plate  . NECK SURGERY  2003   replaced disk  . RADIOLOGY WITH ANESTHESIA N/A 03/08/2019   Procedure: IR WITH ANESTHESIA;  Surgeon: Luanne Bras, MD;  Location: [facility];  Service: Radiology;  Laterality: N/A;  . TEE WITHOUT CARDIOVERSION N/A 10/15/2015   Procedure: TRANSESOPHAGEAL ECHOCARDIOGRAM (TEE);  Surgeon: Larey Dresser, MD;  Location: [facility];  Service: Cardiovascular;  Laterality: N/A;  . TEE WITHOUT CARDIOVERSION N/A 11/24/2015   Procedure: TRANSESOPHAGEAL ECHOCARDIOGRAM (TEE);  Surgeon: Collier Salina  Tommy Rainwater, MD;  Location: [facility];  Service: Cardiovascular;  Laterality: N/A;  . XI ROBOTIC ASSISTED COLOSTOMY TAKEDOWN N/A 03/07/2019   Procedure: XI ROBOTIC ASSISTED LOW ANTERIOR RESECTION, RIGID PROCTOSCOPY;  Surgeon: Michael Boston, MD;  [facility];  Service: General;  Laterality: N/A;    There were no vitals filed for this visit.  Subjective Assessment - 04/16/19 1014    Subjective  Patient reports that his back to work date is now [facility] [facility] [employer] October.  Reports no real issues, just stamina and  endurance, reports that he has to sit down after 20 minutes, he is back driving    Currently in Pain?  No/denies                       [facility] Adult PT Treatment/Exercise - 04/16/19 0001      Knee/Hip Exercises: Aerobic   Nustep  level 5 x 6 minutes      Knee/Hip Exercises: Machines for Strengthening   Cybex Knee Extension  5# 3x10    Cybex Knee Flexion  25# 3x10    Cybex Leg Press  40# 2x10, single legs 20# x10    Other Machine  25# seated row, lats pulls 25# 2x10, 10# shoulder press, 25# triceps, 10# biceps 2x15, 25# squat row 2x 10                  PT Long Term Goals - 04/16/19 1056      PT LONG TERM GOAL #1   Title  Pt will demonstrate independence with balance and strengthening HEP    Status  Partially Met      PT LONG TERM GOAL #2   Title  Pt will improve FGA to 30/30    Status  Partially Met      PT LONG TERM GOAL #4   Title  Pt will demonstrate ability to safely negotiate a ladder    Status  On-going            Plan - 04/16/19 1054    Clinical Impression Statement  Patient with only minor issues of fatigue and weakness, biggest issue will be can he stay on his feet and do some lifting 6 hours a day, he works hard with [place] and only minor rests, for 45 minutes    PT Next Visit Plan  I asked him to check on his insurance about the copay and if he does not have one or if it is waived can we see him 2x/week to ready him for his job    Consulted and Agree with Plan of Care  Patient       Patient will benefit from skilled therapeutic intervention in order to improve the following deficits and impairments:  Decreased balance, Decreased endurance, Difficulty walking, Decreased strength  Visit Diagnosis: Muscle weakness (generalized)     Problem List Patient Active Problem List   Diagnosis Date Noted  . Hypokalemia 03/12/2019  . Expressive aphasia 03/10/2019  . Dysphagia 03/10/2019  . Middle cerebral artery embolism, left 03/09/2019  .  Diverticular stricture ([place]) 03/07/2019  . Bacteria in urine   . Chronic atrial fibrillation 12/02/2018  . Current use of long term anticoagulation 12/02/2018  . UTI (urinary tract infection) 11/29/2018  . Hyponatremia 11/29/2018  . PAF (paroxysmal atrial fibrillation) ([place]) 11/29/2018  . Diverticulitis of large intestine with abscess 09/23/2018  . Dyslipidemia 12/05/2017  . CAD (coronary artery disease), native coronary artery 12/06/2015  . SOB (shortness of breath)   .  NICM (nonischemic cardiomyopathy) ([place])   . Thyroid activity decreased   . Hypothyroidism 08/12/2013  . History of radiation therapy 05/30/2012  . Smokes tobacco daily 05/30/2012  . Cancer of tonsillar fossa ([place]) 11/27/2011  . Tonsil cancer ([place]) 2005  . History of chemotherapy 2005    ALBRIGHT,MICHAEL W., PT 04/16/2019, 10:56 AM  [facility], [place], [address] Phone: [phone]   Fax:  [phone]  Name: Adrian [name] MRN: [id number] Date of Birth: October 06, 1955

## 2019-04-16 NOTE — Telephone Encounter (Signed)
Disability form paid by patient. Office notes and form attach given to medical records.

## 2019-04-16 NOTE — Telephone Encounter (Signed)
Adrian [name] will mailed pt a copy of his disability form per his request.

## 2019-04-16 NOTE — Telephone Encounter (Signed)
Reviewed TSH results and physician's note with patient's wife. She would like the Levothyroxine (Synthroid) 112 mcg tablets sent to mail order pharmacy- [facility]. 90 day supply please.

## 2019-04-17 ENCOUNTER — Ambulatory Visit: Payer: BC Managed Care – PPO | Admitting: Occupational Therapy

## 2019-04-17 ENCOUNTER — Other Ambulatory Visit: Payer: Self-pay

## 2019-04-17 ENCOUNTER — Ambulatory Visit: Payer: BC Managed Care – PPO | Admitting: Speech Pathology

## 2019-04-17 ENCOUNTER — Inpatient Hospital Stay: Payer: Self-pay | Admitting: Adult Health

## 2019-04-17 ENCOUNTER — Other Ambulatory Visit: Payer: Self-pay | Admitting: Family Medicine

## 2019-04-17 DIAGNOSIS — I69318 Other symptoms and signs involving cognitive functions following cerebral infarction: Secondary | ICD-10-CM

## 2019-04-17 DIAGNOSIS — M6281 Muscle weakness (generalized): Secondary | ICD-10-CM

## 2019-04-17 DIAGNOSIS — R41841 Cognitive communication deficit: Secondary | ICD-10-CM

## 2019-04-17 DIAGNOSIS — R1312 Dysphagia, oropharyngeal phase: Secondary | ICD-10-CM

## 2019-04-17 DIAGNOSIS — E039 Hypothyroidism, unspecified: Secondary | ICD-10-CM

## 2019-04-17 DIAGNOSIS — R4701 Aphasia: Secondary | ICD-10-CM

## 2019-04-17 MED ORDER — LEVOTHYROXINE SODIUM 112 MCG PO TABS: 112.0000 ug | ORAL_TABLET | Freq: Every day | ORAL | 3 refills | Status: DC

## 2019-04-17 NOTE — Telephone Encounter (Signed)
Clinic RN called patient. Notified patient per Dr. Elease Hashimoto Thyroid slightly over replaced.  Reduce Levothyroxine to 112 mcg daily and repeat TSH in 2 months. Patient verbalized understanding. Rx sent to [facility]. Patient verbalized he will call back when he locates his calendar. Lab order placed.

## 2019-04-17 NOTE — Telephone Encounter (Signed)
Done

## 2019-04-17 NOTE — Patient Instructions (Signed)
Continue the 3 swallowing exercises highlighted on your handout. Remember to complete them as a "set". You should complete each "set" 2-3 times per day. We want to work your muscles to the point where they are starting to become fatigued.   Effortful swallow: would like to work up to being able to do 20 in a row within a 2 minute period.   In conversation, slow down. Give yourself plenty of time to find the words. If you can't find the word you are looking for, try not to "abandon" what you're saying. Remember the strategies we practiced:  Tips for Talking with People who have Aphasia  . Say one thing at a time . Don't  rush - slow down, be patient . Talk face to face . Reduce background noise . Relax - be natural . Use pen and paper . Write down key words . Draw diagrams or pictures . Don't pretend you understand . Ask what helps . Recap - check you both understand   Describing words  What group does it belong to?  What do I use it for?  Where can I find it?  What does it LOOK like?  What other words go with it?  What is the 1st sound of the word?   Many Ways to Communicate  Describe it Write it Draw it Gesture it Use related words

## 2019-04-17 NOTE — Patient Instructions (Signed)

## 2019-04-17 NOTE — Telephone Encounter (Signed)
I called Adrian [name] pts wife  about the form for disability.PTs  wife stated if pt can achieve lifting 60lbs can the date of 10/18 be change to earlier date. Also she wanted to know about the letter for pt driving. I stated if pt is doing well and can achieve goal of lifting at least 60lbs the return date to work can be change. I stated Hilda Blades in medical records sent letter of driving and disability form to pts address. Pts wife verbalized understanding.

## 2019-04-17 NOTE — Telephone Encounter (Signed)
Pt has an Rx for levothyroxine (SYNTHROID) 125 MCG tablet At [facility] on [address] but needs them sent to  [place], [place] to Registered [employer] [facility] [phone] (Phone) [phone] (Fax)   Pt will run out soon and needs sent asap for time to be mailed/ please advise

## 2019-04-17 NOTE — Therapy (Signed)
[facility] [address] [place], [place], [address] Phone: [phone]   Fax:  [phone]  Occupational Therapy Treatment  Patient Details  Name: Adrian [name] MRN: [id number] Date of Birth: Apr 18, 1956 Referring Provider (OT): Carolann Littler   Encounter Date: 04/17/2019  OT End of Session - 04/17/19 1517    Visit Number  4    Number of Visits  9    Authorization Type  BC/BS    OT Start Time  9892    OT Stop Time  1194    OT Time Calculation (min)  45 min    Activity Tolerance  Patient tolerated treatment well       Past Medical History:  Diagnosis Date  . AF (paroxysmal atrial fibrillation) ([place]) 11/29/2018  . Atrial flutter ([place]) 10/12/2015  . CAD (coronary artery disease), native coronary artery 12/06/2015   a. 10/2015 MV: EF  37%, reversible defect inferior apex, intermediate risk findings; b. 10/2015 Cath: 20% mid RCA.  . Colonic diverticular abscess   . Diverticulitis   . History of chemotherapy 2005   Cisplatin  . Hypothyroidism   . NICM (nonischemic cardiomyopathy) ([place]) 10/14/2015   a. Tachy mediated?;  b. Echo 3/17 - Mild concentric LVH, EF 30-35%, anteroseptal, anterior, anterolateral, apical anterior, lateral hypokinesis, trivial MR, mild to moderately reduced RVSF; c. LHC 3/17 - mRCA 20%  . Paroxysmal atrial flutter ([place])    a. TEE 3/17 with ? LAA clot-->s/p TEE/DCCV 11/24/2015  . Radiation NOv.3,2005-Dec. 15, 2005   6810 cGy in 30 fractions  . Tonsil cancer [facility]) 2005   Dr Romeo Rabon.  XRT    Past Surgical History:  Procedure Laterality Date  . APPENDECTOMY N/A 03/07/2019   Procedure: ROBOTIC ASSISTED APPENDECTOMY;  Surgeon: Michael Boston, MD;  [facility];  Service: General;  Laterality: N/A;  . CARDIAC CATHETERIZATION N/A 10/18/2015   Procedure: Left Heart Cath and Coronary Angiography;  Surgeon: Burnell Blanks, MD;  Location: [facility];  Service: Cardiovascular;  Laterality:  N/A;  . CARDIOVERSION N/A 11/24/2015   Procedure: CARDIOVERSION;  Surgeon: Josue Hector, MD;  Location: [facility];  Service: Cardiovascular;  Laterality: N/A;  . CYSTOSCOPY WITH STENT PLACEMENT Bilateral 03/07/2019   Procedure: CYSTOSCOPY WITH BILATERAL FIREFLY INJECTION;  Surgeon: Ardis Hughs, MD;  [facility];  Service: Urology;  Laterality: Bilateral;  . GASTROSTOMY TUBE PLACEMENT  07/04/2004   IR - G tube for tonsilar cancer  . IR ANGIO INTRA EXTRACRAN SEL COM CAROTID INNOMINATE UNI R MOD SED  03/09/2019  . IR ANGIO VERTEBRAL SEL VERTEBRAL UNI R MOD SED  03/09/2019  . IR CT HEAD LTD  03/09/2019  . IR PERCUTANEOUS ART THROMBECTOMY/INFUSION INTRACRANIAL INC DIAG ANGIO  03/09/2019  . IR RADIOLOGIST EVAL & MGMT  12/18/2018  . LAMINECTOMY     C5/placement of steel plate  . NECK SURGERY  2003   replaced disk  . RADIOLOGY WITH ANESTHESIA N/A 03/08/2019   Procedure: IR WITH ANESTHESIA;  Surgeon: Luanne Bras, MD;  Location: [facility];  Service: Radiology;  Laterality: N/A;  . TEE WITHOUT CARDIOVERSION N/A 10/15/2015   Procedure: TRANSESOPHAGEAL ECHOCARDIOGRAM (TEE);  Surgeon: Larey Dresser, MD;  Location: [facility];  Service: Cardiovascular;  Laterality: N/A;  . TEE WITHOUT CARDIOVERSION N/A 11/24/2015   Procedure: TRANSESOPHAGEAL ECHOCARDIOGRAM (TEE);  Surgeon: Josue Hector, MD;  Location: [facility];  Service: Cardiovascular;  Laterality: N/A;  . XI ROBOTIC ASSISTED COLOSTOMY TAKEDOWN N/A 03/07/2019   Procedure:  XI ROBOTIC ASSISTED LOW ANTERIOR RESECTION, RIGID PROCTOSCOPY;  Surgeon: Michael Boston, MD;  [facility];  Service: General;  Laterality: N/A;    There were no vitals filed for this visit.  Subjective Assessment - 04/17/19 1448    Pertinent History  Lt MCA CVA 03/08/19. PMH: A-FIB, CAD, h/o CA    Currently in Pain?  No/denies       Pt issued memory strategies and reviewed. Specifically discussed how to implement for medication management. Pt is dispensing meds  in pillbox w/ supervision but now remembering to take w/ alarms set on phone Pt lifting, moving, carrying 35 # box in prep for work simulation. Pt also practiced turning box on different sides in prep for scanning for work at table height. Pt sliding box along table UBE x 8 min. Level 8 for strength/endurance BUE's Environmental scanning while ambulating and tossing ball w/ 12/14 found on 1st pass, missing 2 on Rt side. Pt did not find on 2nd pass, but found both on 3rd pass.                     OT Education - 04/17/19 1456    Education Details  memory strategies    Person(s) Educated  Patient    Methods  Explanation;Handout    Comprehension  Verbalized understanding          OT Long Term Goals - 04/17/19 1529      OT LONG TERM GOAL #1   Title  Independent with HEP for Rt hand coordination and Rt shoulder strength    Time  4    Period  Weeks    Status  Achieved      OT LONG TERM GOAL #2   Title  Improve coordination Rt hand to 25 sec. or less on 9 hole peg test    Baseline  27 sec    Time  4    Period  Weeks    Status  On-going      OT LONG TERM GOAL #3   Title  Pt to perform medication management w/ external aids/cues prn I'ly after placed in pillbox by family    Time  4    Period  Weeks    Status  On-going      OT LONG TERM GOAL #4   Title  Pt to lift up to 50 # BUE's and carrying short distance using proper body mechanics in prep for return to work    Time  4    Period  Weeks    Status  On-going            Plan - 04/17/19 1530    Clinical Impression Statement  Pt has met 1 LTG. Pt progressing towards remaining goals.    Occupational performance deficits (Please refer to evaluation for details):  IADL's;Work    Body Structure / Function / Physical Skills  IADL;FMC;Coordination;Strength;UE functional use;Body mechanics;Vision;Mobility    Cognitive Skills  Memory    OT Frequency  2x / week    OT Duration  4 weeks    OT Treatment/Interventions   Therapeutic exercise;Self-care/ADL training;Neuromuscular education;Therapeutic activities;Coping strategies training;Cognitive remediation/compensation;Visual/perceptual remediation/compensation;Patient/family education;Passive range of motion    Plan  coordination, progress strengthening    Consulted and Agree with Plan of Care  Patient       Patient will benefit from skilled therapeutic intervention in order to improve the following deficits and impairments:   Body Structure / Function / Physical Skills: IADL,  [place], Coordination, Strength, UE functional use, Body mechanics, Vision, Mobility Cognitive Skills: Memory     Visit Diagnosis: Muscle weakness (generalized)  Other symptoms and signs involving cognitive functions following cerebral infarction    Problem List Patient Active Problem List   Diagnosis Date Noted  . Hypokalemia 03/12/2019  . Expressive aphasia 03/10/2019  . Dysphagia 03/10/2019  . Middle cerebral artery embolism, left 03/09/2019  . Diverticular stricture ([facility]) 03/07/2019  . Bacteria in urine   . Chronic atrial fibrillation 12/02/2018  . Current use of long term anticoagulation 12/02/2018  . UTI (urinary tract infection) 11/29/2018  . Hyponatremia 11/29/2018  . PAF (paroxysmal atrial fibrillation) ([facility]) 11/29/2018  . Diverticulitis of large intestine with abscess 09/23/2018  . Dyslipidemia 12/05/2017  . CAD (coronary artery disease), native coronary artery 12/06/2015  . SOB (shortness of breath)   . NICM (nonischemic cardiomyopathy) ([facility])   . Thyroid activity decreased   . Hypothyroidism 08/12/2013  . History of radiation therapy 05/30/2012  . Smokes tobacco daily 05/30/2012  . Cancer of tonsillar fossa ([facility]) 11/27/2011  . Tonsil cancer ([place]) 2005  . History of chemotherapy 2005    Carey Bullocks , OTR/L 04/17/2019, 3:32 PM  [facility] [address] [place], [place],  [address] Phone: [phone]   Fax:  [phone]  Name: SHAURYA RAWDON MRN: [id number] Date of Birth: 03/24/1956

## 2019-04-17 NOTE — Therapy (Signed)
[facility] [address] [place], [place], [address] Phone: [phone]   Fax:  [phone]  Speech Language Pathology Treatment  Patient Details  Name: Adrian [name] MRN: [id number] Date of Birth: Feb 16, 1956 Referring Provider (SLP): [name], MD (PCP) referred to therapy by NP at hospital   Encounter Date: 04/17/2019  End of Session - 04/17/19 1513    Visit Number  7    Number of Visits  17    Date for SLP Re-Evaluation  05/24/19    Authorization Type  BCBS    SLP Start Time  [id number]    SLP Stop Time   1445    SLP Time Calculation (min)  40 min    Activity Tolerance  Patient tolerated treatment well       Past Medical History:  Diagnosis Date  . AF (paroxysmal atrial fibrillation) ([facility]) 11/29/2018  . Atrial flutter ([place] [facility]) 10/12/2015  . CAD (coronary artery disease), native coronary artery 12/06/2015   a. 10/2015 MV: EF  37%, reversible defect inferior apex, intermediate risk findings; b. 10/2015 Cath: 20% mid RCA.  . Colonic diverticular abscess   . Diverticulitis   . History of chemotherapy 2005   Cisplatin  . Hypothyroidism   . NICM (nonischemic cardiomyopathy) ([facility]) 10/14/2015   a. Tachy mediated?;  b. Echo 3/17 - Mild concentric LVH, EF 30-35%, anteroseptal, anterior, anterolateral, apical anterior, lateral hypokinesis, trivial MR, mild to moderately reduced RVSF; c. LHC 3/17 - mRCA 20%  . Paroxysmal atrial flutter ([facility])    a. TEE 3/17 with ? LAA clot-->s/p TEE/DCCV 11/24/2015  . Radiation NOv.3,2005-Dec. 15, 2005   6810 cGy in 30 fractions  . Tonsil cancer [facility]) 2005   Dr Romeo Rabon.  XRT    Past Surgical History:  Procedure Laterality Date  . APPENDECTOMY N/A 03/07/2019   Procedure: ROBOTIC ASSISTED APPENDECTOMY;  Surgeon: Michael Boston, MD;  [facility];  Service: General;  Laterality: N/A;  . CARDIAC CATHETERIZATION N/A 10/18/2015   Procedure: Left Heart Cath and Coronary Angiography;  Surgeon:  Burnell Blanks, MD;  Location: [facility];  Service: Cardiovascular;  Laterality: N/A;  . CARDIOVERSION N/A 11/24/2015   Procedure: CARDIOVERSION;  Surgeon: Josue Hector, MD;  Location: [facility];  Service: Cardiovascular;  Laterality: N/A;  . CYSTOSCOPY WITH STENT PLACEMENT Bilateral 03/07/2019   Procedure: CYSTOSCOPY WITH BILATERAL FIREFLY INJECTION;  Surgeon: Ardis Hughs, MD;  [facility];  Service: Urology;  Laterality: Bilateral;  . GASTROSTOMY TUBE PLACEMENT  07/04/2004   IR - G tube for tonsilar cancer  . IR ANGIO INTRA EXTRACRAN SEL COM CAROTID INNOMINATE UNI R MOD SED  03/09/2019  . IR ANGIO VERTEBRAL SEL VERTEBRAL UNI R MOD SED  03/09/2019  . IR CT HEAD LTD  03/09/2019  . IR PERCUTANEOUS ART THROMBECTOMY/INFUSION INTRACRANIAL INC DIAG ANGIO  03/09/2019  . IR RADIOLOGIST EVAL & MGMT  12/18/2018  . LAMINECTOMY     C5/placement of steel plate  . NECK SURGERY  2003   replaced disk  . RADIOLOGY WITH ANESTHESIA N/A 03/08/2019   Procedure: IR WITH ANESTHESIA;  Surgeon: Luanne Bras, MD;  Location: [facility];  Service: Radiology;  Laterality: N/A;  . TEE WITHOUT CARDIOVERSION N/A 10/15/2015   Procedure: TRANSESOPHAGEAL ECHOCARDIOGRAM (TEE);  Surgeon: Larey Dresser, MD;  Location: [facility];  Service: Cardiovascular;  Laterality: N/A;  . TEE WITHOUT CARDIOVERSION N/A 11/24/2015   Procedure: TRANSESOPHAGEAL ECHOCARDIOGRAM (TEE);  Surgeon: Josue Hector, MD;  [facility];  Service: Cardiovascular;  Laterality: N/A;  . XI ROBOTIC ASSISTED COLOSTOMY TAKEDOWN N/A 03/07/2019   Procedure: XI ROBOTIC ASSISTED LOW ANTERIOR RESECTION, RIGID PROCTOSCOPY;  Surgeon: Michael Boston, MD;  [facility];  Service: General;  Laterality: N/A;    There were no vitals filed for this visit.  Subjective Assessment - 04/17/19 1408    Subjective  "He said hopefully 2 appointments for the week for, uh, OT"    Currently in Pain?  No/denies            ADULT SLP  TREATMENT - 04/17/19 1451      General Information   Behavior/Cognition  Alert;Cooperative;Pleasant mood      Treatment Provided   Treatment provided  Cognitive-Linquistic;Dysphagia      Dysphagia Treatment   Temperature Spikes Noted  No    Respiratory Status  Room air    Oral Cavity - Dentition  Adequate natural dentition    Treatment Methods  Skilled observation;Upgraded PO texture trial;Differential diagnosis;Therapeutic exercise;Compensation strategy training;Patient/caregiver education    Patient observed directly with PO's  Yes    Type of PO's observed  Dysphagia 3 (soft);Thin liquids;Nectar-thick liquids    Feeding  Able to feed self    Liquids provided via  Cup    Pharyngeal Phase Signs & Symptoms  Immediate throat clear   both volitional and spontaneous   Type of cueing  Verbal    Amount of cueing  Minimal    Other treatment/comments  Pt reports he is "not really" thickening drinks. SLP assessed pt today with both thin and nectar thick liquids, as well as soft cereal bar. Pt implemented volitional throat clearing (though SLP suspects some are also spontaneous) after each swallow, as well as effortful swallow, with clear vocal quality afterwards. Assessed both with and without head turn as pt is not regularly using head turn at home, without significant change in vocal quality or throat clearing with either position. With cereal bar, pt indpendently completed 2 additional throat clears/swallows and used liquid wash, again vocal quality clear when using compensations. SLP reinforced need to use cough/throat clear after each swallow and any time he notices "wet" or "gurgly" voice. Vocal quality in general was improved from previous sessions, with SLP needing to cue pt verbally x1 when wet voice noted. No s/sx aspiration PNA today and none reported. Pt did not bring chicken or tuna salad as planned. Pt required usual min-mod cues initially for correct procedure for effortful swallow, high  pitched "he" and Mendelsohn maneuver. Able to fade to supervision. SLP instructed pt re: need to complete repetitions as "sets" to the point of fatigue vs isolated swallows throughout the day and rationale for this.      Cognitive-Linquistic Treatment   Treatment focused on  Aphasia    Skilled Treatment  SLP reviewed pt's homework (alternating attention 100%, simple organization 100%, attention to detail/problem solving 90%). Pt required min cues for double checking to locate and correct errors. Pt reported he was surprised to have difficulty with higher digit multiplication. Took a break and returned to complete when working at home. Simple conversation today was generally functional, but with mod complex conversation pt had several instances of dysnomia/anomia, of which he was generally aware (~80%). Required occasional mod cues to slow rate and to use compensations rather than abandoning topic.       Assessment / Recommendations / Plan   Plan  Continue with current plan of care      Progression Toward  Goals   Progression toward goals  Progressing toward goals       SLP Education - 04/17/19 1511    Education Details  aphasia compensations, swallow compensations    Person(s) Educated  Patient    Methods  Explanation;Handout    Comprehension  Verbalized understanding       SLP Short Term Goals - 04/17/19 1517      SLP SHORT TERM GOAL #1   Title  Pt will demo HEP for dysphagia with rare min A x 3 sessions.    Time  2    Period  Weeks    Status  On-going      SLP SHORT TERM GOAL #2   Title  Pt will complete further assessment of cognition and writing and goals added as appropriate, within first 4 visits.    Status  Achieved      SLP SHORT TERM GOAL #3   Title  Pt will utitlize multimodal communication/compensations for anomia in 8 minutes simple-mod complex conversation over 3 sessions with occasional min A    Baseline  04/15/19 04/17/19    Time  2    Period  Weeks    Status   On-going      SLP SHORT TERM GOAL #4   Title  Pt will alternate attention between 2 functional tasks for 12 minutes >90% accuracy x 2 sessions with compensations allowed.    Baseline  04/17/19    Time  4    Period  Weeks    Status  On-going       SLP Long Term Goals - 04/17/19 1518      SLP LONG TERM GOAL #1   Title  Pt will complete HEP for dysphagia with modified independence x 3 sessions.    Time  6    Period  Weeks    Status  On-going      SLP LONG TERM GOAL #2   Title  Pt will participate in repeat MBS for objective assessment of swallow function to determine readiness for diet advancement.    Time  6    Period  Weeks    Status  On-going      SLP LONG TERM GOAL #3   Title  Pt will utitlize multimodal communication/compensations for anomia in 12 minutes mod complex conversation over 3 sessions with occasional min A    Time  6    Period  Weeks    Status  On-going      SLP LONG TERM GOAL #4   Title  Pt will demo functional divided attention for 15 minutes x2 sessions, >95% accuracy on tasks with double checking allowed, x2 sessions.    Time  6    Period  Weeks    Status  New       Plan - 04/17/19 1514    Clinical Impression Statement  Mr. Sera cont to present with mild anomic aphasia, mild dysarthria and oropharyngeal dysphagia. Pt was upgraded to "runny nectar" previously by SLP. He is electing to take thin liquids at this time. SLP reinforced need to use throat clear, effortful swallow, and s/sx of aspiration PNA. Vocal quality clear when consuming thin liquids, mech soft solids with use of compensatory throat clearing, multiple swallows today. Aphasic errors noted in mod complex conversation today. Attention to detail, problem solving highlighted today as deficient. Pt has been cleared to drive by MD. See "skilled intervention" for more details of session.  I recommend cont'd skilled ST to address  communication and swallowing impairments, in order to maximize pt's  independence and safety, increase his ability to communicate wants and needs, and improve quality of life.    Speech Therapy Frequency  2x / week    Duration  --   8 weeks/17 visits   Treatment/Interventions  Aspiration precaution training;Diet toleration management by SLP;Environmental controls;Trials of upgraded texture/liquids;Cognitive reorganization;Oral motor exercises;Multimodal communcation approach;Compensatory strategies;Pharyngeal strengthening exercises;Language facilitation;Compensatory techniques;Cueing hierarchy;Internal/external aids;Functional tasks;SLP instruction and feedback;Patient/family education    Potential to Achieve Goals  Good    Consulted and Agree with Plan of Care  Patient       Patient will benefit from skilled therapeutic intervention in order to improve the following deficits and impairments:   Dysphagia, oropharyngeal phase  Aphasia  Cognitive communication deficit    Problem List Patient Active Problem List   Diagnosis Date Noted  . Hypokalemia 03/12/2019  . Expressive aphasia 03/10/2019  . Dysphagia 03/10/2019  . Middle cerebral artery embolism, left 03/09/2019  . Diverticular stricture ([place]) 03/07/2019  . Bacteria in urine   . Chronic atrial fibrillation 12/02/2018  . Current use of long term anticoagulation 12/02/2018  . UTI (urinary tract infection) 11/29/2018  . Hyponatremia 11/29/2018  . PAF (paroxysmal atrial fibrillation) ([place]) 11/29/2018  . Diverticulitis of large intestine with abscess 09/23/2018  . Dyslipidemia 12/05/2017  . CAD (coronary artery disease), native coronary artery 12/06/2015  . SOB (shortness of breath)   . NICM (nonischemic cardiomyopathy) ([place])   . Thyroid activity decreased   . Hypothyroidism 08/12/2013  . History of radiation therapy 05/30/2012  . Smokes tobacco daily 05/30/2012  . Cancer of tonsillar fossa ([place]) 11/27/2011  . Tonsil cancer ([place]) 2005  . History of chemotherapy 2005   Deneise Lever, [place],  [facility] Speech-Language Pathologist  Aliene Altes 04/17/2019, 3:18 PM  [facility] [address] [place], [place], [address] Phone: [phone]   Fax:  [phone]   Name: RAYLEE MADANI MRN: [id number] Date of Birth: 08-Jun-1956

## 2019-04-17 NOTE — Telephone Encounter (Signed)
Pt wife(on DPR) is asking for a call from RN re: the disability form

## 2019-04-21 ENCOUNTER — Ambulatory Visit: Payer: BC Managed Care – PPO

## 2019-04-21 ENCOUNTER — Other Ambulatory Visit: Payer: Self-pay

## 2019-04-21 ENCOUNTER — Ambulatory Visit: Payer: BC Managed Care – PPO | Admitting: Occupational Therapy

## 2019-04-21 ENCOUNTER — Other Ambulatory Visit: Payer: Self-pay | Admitting: Family Medicine

## 2019-04-21 DIAGNOSIS — M6281 Muscle weakness (generalized): Secondary | ICD-10-CM

## 2019-04-21 DIAGNOSIS — R278 Other lack of coordination: Secondary | ICD-10-CM

## 2019-04-21 DIAGNOSIS — R1312 Dysphagia, oropharyngeal phase: Secondary | ICD-10-CM | POA: Diagnosis not present

## 2019-04-21 NOTE — Therapy (Addendum)
[facility] [address] [place], [place], [address] Phone: [phone]   Fax:  [phone]  Occupational Therapy Treatment  Patient Details  Name: Adrian [name] MRN: [id number] Date of Birth: Jun 11, 1956 Referring Provider (OT): Carolann Littler   Encounter Date: 04/21/2019  OT End of Session - 04/21/19 1026    Visit Number  5    Number of Visits  9    Authorization Type  BC/BS    OT Start Time  1018    OT Stop Time  1100    OT Time Calculation (min)  42 min    Activity Tolerance  Patient tolerated treatment well    Behavior During Therapy  [place] for tasks assessed/performed       Past Medical History:  Diagnosis Date  . AF (paroxysmal atrial fibrillation) ([place]) 11/29/2018  . Atrial flutter ([place]) 10/12/2015  . CAD (coronary artery disease), native coronary artery 12/06/2015   a. 10/2015 MV: EF  37%, reversible defect inferior apex, intermediate risk findings; b. 10/2015 Cath: 20% mid RCA.  . Colonic diverticular abscess   . Diverticulitis   . History of chemotherapy 2005   Cisplatin  . Hypothyroidism   . NICM (nonischemic cardiomyopathy) ([place]) 10/14/2015   a. Tachy mediated?;  b. Echo 3/17 - Mild concentric LVH, EF 30-35%, anteroseptal, anterior, anterolateral, apical anterior, lateral hypokinesis, trivial MR, mild to moderately reduced RVSF; c. LHC 3/17 - mRCA 20%  . Paroxysmal atrial flutter ([place])    a. TEE 3/17 with ? LAA clot-->s/p TEE/DCCV 11/24/2015  . Radiation NOv.3,2005-Dec. 15, 2005   6810 cGy in 30 fractions  . 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  XRT    Past Surgical History:  Procedure Laterality Date  . APPENDECTOMY N/A 03/07/2019   Procedure: ROBOTIC ASSISTED APPENDECTOMY;  Surgeon: Michael Boston, MD;  [facility];  Service: General;  Laterality: N/A;  . CARDIAC CATHETERIZATION N/A 10/18/2015   Procedure: Left Heart Cath and Coronary Angiography;  Surgeon: Burnell Blanks, MD;  Location:  [place] [facility];  Service: Cardiovascular;  Laterality: N/A;  . CARDIOVERSION N/A 11/24/2015   Procedure: CARDIOVERSION;  Surgeon: Josue Hector, MD;  Location: [place];  Service: Cardiovascular;  Laterality: N/A;  . CYSTOSCOPY WITH STENT PLACEMENT Bilateral 03/07/2019   Procedure: CYSTOSCOPY WITH BILATERAL FIREFLY INJECTION;  Surgeon: Ardis Hughs, MD;  [facility];  Service: Urology;  Laterality: Bilateral;  . GASTROSTOMY TUBE PLACEMENT  07/04/2004   IR - G tube for tonsilar cancer  . IR ANGIO INTRA EXTRACRAN SEL COM CAROTID INNOMINATE UNI R MOD SED  03/09/2019  . IR ANGIO VERTEBRAL SEL VERTEBRAL UNI R MOD SED  03/09/2019  . IR CT HEAD LTD  03/09/2019  . IR PERCUTANEOUS ART THROMBECTOMY/INFUSION INTRACRANIAL INC DIAG ANGIO  03/09/2019  . IR RADIOLOGIST EVAL & MGMT  12/18/2018  . LAMINECTOMY     C5/placement of steel plate  . NECK SURGERY  2003   replaced disk  . RADIOLOGY WITH ANESTHESIA N/A 03/08/2019   Procedure: IR WITH ANESTHESIA;  Surgeon: Luanne Bras, MD;  Location: [place];  Service: Radiology;  Laterality: N/A;  . TEE WITHOUT CARDIOVERSION N/A 10/15/2015   Procedure: TRANSESOPHAGEAL ECHOCARDIOGRAM (TEE);  Surgeon: Larey Dresser, MD;  Location: [facility];  Service: Cardiovascular;  Laterality: N/A;  . TEE WITHOUT CARDIOVERSION N/A 11/24/2015   Procedure: TRANSESOPHAGEAL ECHOCARDIOGRAM (TEE);  Surgeon: Josue Hector, MD;  Location: [place];  Service: Cardiovascular;  Laterality: N/A;  .  XI ROBOTIC ASSISTED COLOSTOMY TAKEDOWN N/A 03/07/2019   Procedure: XI ROBOTIC ASSISTED LOW ANTERIOR RESECTION, RIGID PROCTOSCOPY;  Surgeon: Michael Boston, MD;  [facility];  Service: General;  Laterality: N/A;    There were no vitals filed for this visit.  Subjective Assessment - 04/21/19 1019    Subjective   I'm doing well.    Pertinent History  Lt MCA CVA 03/08/19. PMH: A-FIB, CAD, h/o CA    Currently in Pain?  No/denies        Assessed 9 hole peg test Rt = 28  sec Pt copying small peg design Rt hand for fine motor coordination and visual/perceptual skills - pt w/ min difficulty picking up and placing small pegs w/ 1-2 drops and assist from Lt hand x 2.  Pt copied design at 100% accuracy after 1 cue to complete. Pt then asked to remove pegs up to 5 at a time for fingertip to/from palm translation w/ min drops/difficulty Work simulation - picking up and moving 40 lb box from floor and moving along tabletop counter level. Also turned box up on different sides in prep for "scanning" for work tasks.  UBE x 8 min. Level 8 resistance for UE strength/endurance.  Bilateral shoulder flexion and alternating extension ex's with 2 lb weight each UE x 10 reps each. Pt had some difficulty with overhead flexion and demo some mild compensations                        OT Long Term Goals - 04/21/19 1021      OT LONG TERM GOAL #1   Title  Independent with HEP for Rt hand coordination and Rt shoulder strength    Time  4    Period  Weeks    Status  Achieved      OT LONG TERM GOAL #2   Title  Improve coordination Rt hand to 25 sec. or less on 9 hole peg test    Baseline  27 sec    Time  4    Period  Weeks    Status  On-going      OT LONG TERM GOAL #3   Title  Pt to perform medication management w/ external aids/cues prn I'ly after placed in pillbox by family    Time  4    Period  Weeks    Status  Achieved      OT LONG TERM GOAL #4   Title  Pt to lift up to 50 # BUE's and carrying short distance using proper body mechanics in prep for return to work    Time  4    Period  Weeks    Status  On-going            Plan - 04/21/19 1027    Clinical Impression Statement  Pt progressing towards goals. Pt met LTG #3.    Occupational performance deficits (Please refer to evaluation for details):  IADL's;Work    Body Structure / Function / Physical Skills  IADL;FMC;Coordination;Strength;UE functional use;Body mechanics;Vision;Mobility     Cognitive Skills  Memory    Rehab Potential  Good    OT Frequency  2x / week    OT Duration  4 weeks    OT Treatment/Interventions  Therapeutic exercise;Self-care/ADL training;Neuromuscular education;Therapeutic activities;Coping strategies training;Cognitive remediation/compensation;Visual/perceptual remediation/compensation;Patient/family education;Passive range of motion    Plan  environmental scanning, continue work related tasks and UB strengthening       Patient will benefit  from skilled therapeutic intervention in order to improve the following deficits and impairments:   Body Structure / Function / Physical Skills: IADL, FMC, Coordination, Strength, UE functional use, Body mechanics, Vision, Mobility Cognitive Skills: Memory     Visit Diagnosis: Other lack of coordination  Muscle weakness (generalized)    Problem List Patient Active Problem List   Diagnosis Date Noted  . Hypokalemia 03/12/2019  . Expressive aphasia 03/10/2019  . Dysphagia 03/10/2019  . Middle cerebral artery embolism, left 03/09/2019  . Diverticular stricture ([place]) 03/07/2019  . Bacteria in urine   . Chronic atrial fibrillation 12/02/2018  . Current use of long term anticoagulation 12/02/2018  . UTI (urinary tract infection) 11/29/2018  . Hyponatremia 11/29/2018  . PAF (paroxysmal atrial fibrillation) ([place]) 11/29/2018  . Diverticulitis of large intestine with abscess 09/23/2018  . Dyslipidemia 12/05/2017  . CAD (coronary artery disease), native coronary artery 12/06/2015  . SOB (shortness of breath)   . NICM (nonischemic cardiomyopathy) ([place])   . Thyroid activity decreased   . Hypothyroidism 08/12/2013  . History of radiation therapy 05/30/2012  . Smokes tobacco daily 05/30/2012  . Cancer of tonsillar fossa ([place]) 11/27/2011  . Tonsil cancer ([place]) 2005  . History of chemotherapy 2005    Carey Bullocks, OTR/L 04/21/2019, 10:28 AM  [facility] [address] [place], [place], [address] Phone: [phone]   Fax:  [phone]  Name: Adrian [name] MRN: [id number] Date of Birth: 01/24/1956

## 2019-04-23 ENCOUNTER — Other Ambulatory Visit: Payer: Self-pay

## 2019-04-23 ENCOUNTER — Encounter: Payer: Self-pay | Admitting: Physical Therapy

## 2019-04-23 ENCOUNTER — Ambulatory Visit: Payer: BC Managed Care – PPO | Admitting: Physical Therapy

## 2019-04-23 DIAGNOSIS — R278 Other lack of coordination: Secondary | ICD-10-CM

## 2019-04-23 DIAGNOSIS — M6281 Muscle weakness (generalized): Secondary | ICD-10-CM

## 2019-04-23 DIAGNOSIS — R1312 Dysphagia, oropharyngeal phase: Secondary | ICD-10-CM | POA: Diagnosis not present

## 2019-04-23 NOTE — Therapy (Signed)
[facility] [place], [place], [address] Phone: [phone]   Fax:  [phone]  Physical Therapy Treatment  Patient Details  Name: Adrian [name] MRN: [id number] Date of Birth: 1956-07-28 Referring Provider (PT): [name], MD (PCP) referred to therapy by NP at hospital   Encounter Date: 04/23/2019  PT End of Session - 04/23/19 1053    Visit Number  5    Date for PT Re-Evaluation  05/11/19    PT Start Time  2876    PT Stop Time  1055    PT Time Calculation (min)  40 min       Past Medical History:  Diagnosis Date  . AF (paroxysmal atrial fibrillation) ([place]) 11/29/2018  . Atrial flutter ([place]) 10/12/2015  . CAD (coronary artery disease), native coronary artery 12/06/2015   a. 10/2015 MV: EF  37%, reversible defect inferior apex, intermediate risk findings; b. 10/2015 Cath: 20% mid RCA.  . Colonic diverticular abscess   . Diverticulitis   . History of chemotherapy 2005   Cisplatin  . Hypothyroidism   . NICM (nonischemic cardiomyopathy) ([place]) 10/14/2015   a. Tachy mediated?;  b. Echo 3/17 - Mild concentric LVH, EF 30-35%, anteroseptal, anterior, anterolateral, apical anterior, lateral hypokinesis, trivial MR, mild to moderately reduced RVSF; c. LHC 3/17 - mRCA 20%  . Paroxysmal atrial flutter ([place])    a. TEE 3/17 with ? LAA clot-->s/p TEE/DCCV 11/24/2015  . Radiation NOv.3,2005-Dec. 15, 2005   6810 cGy in 30 fractions  . 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  XRT    Past Surgical History:  Procedure Laterality Date  . APPENDECTOMY N/A 03/07/2019   Procedure: ROBOTIC ASSISTED APPENDECTOMY;  Surgeon: Michael Boston, MD;  [facility];  Service: General;  Laterality: N/A;  . CARDIAC CATHETERIZATION N/A 10/18/2015   Procedure: Left Heart Cath and Coronary Angiography;  Surgeon: Burnell Blanks, MD;  Location: [place] [facility];  Service: Cardiovascular;  Laterality: N/A;  . CARDIOVERSION N/A  11/24/2015   Procedure: CARDIOVERSION;  Surgeon: Josue Hector, MD;  Location: [facility];  Service: Cardiovascular;  Laterality: N/A;  . CYSTOSCOPY WITH STENT PLACEMENT Bilateral 03/07/2019   Procedure: CYSTOSCOPY WITH BILATERAL FIREFLY INJECTION;  Surgeon: Ardis Hughs, MD;  [facility];  Service: Urology;  Laterality: Bilateral;  . GASTROSTOMY TUBE PLACEMENT  07/04/2004   IR - G tube for tonsilar cancer  . IR ANGIO INTRA EXTRACRAN SEL COM CAROTID INNOMINATE UNI R MOD SED  03/09/2019  . IR ANGIO VERTEBRAL SEL VERTEBRAL UNI R MOD SED  03/09/2019  . IR CT HEAD LTD  03/09/2019  . IR PERCUTANEOUS ART THROMBECTOMY/INFUSION INTRACRANIAL INC DIAG ANGIO  03/09/2019  . IR RADIOLOGIST EVAL & MGMT  12/18/2018  . LAMINECTOMY     C5/placement of steel plate  . NECK SURGERY  2003   replaced disk  . RADIOLOGY WITH ANESTHESIA N/A 03/08/2019   Procedure: IR WITH ANESTHESIA;  Surgeon: Luanne Bras, MD;  Location: [facility];  Service: Radiology;  Laterality: N/A;  . TEE WITHOUT CARDIOVERSION N/A 10/15/2015   Procedure: TRANSESOPHAGEAL ECHOCARDIOGRAM (TEE);  Surgeon: Larey Dresser, MD;  Location: [facility];  Service: Cardiovascular;  Laterality: N/A;  . TEE WITHOUT CARDIOVERSION N/A 11/24/2015   Procedure: TRANSESOPHAGEAL ECHOCARDIOGRAM (TEE);  Surgeon: Josue Hector, MD;  Location: [place];  Service: Cardiovascular;  Laterality: N/A;  . XI ROBOTIC ASSISTED COLOSTOMY TAKEDOWN N/A 03/07/2019   Procedure: XI ROBOTIC ASSISTED  LOW ANTERIOR RESECTION, RIGID PROCTOSCOPY;  Surgeon: Michael Boston, MD;  [facility];  Service: General;  Laterality: N/A;    There were no vitals filed for this visit.  Subjective Assessment - 04/23/19 1017    Subjective  patient doing well, said OT is going well but thinking about cesation of attendance of OT    Currently in Pain?  No/denies                       [facility] Adult PT Treatment/Exercise - 04/23/19 0001      Knee/Hip Exercises: Aerobic    Nustep  level 5 x 5 minutes      Knee/Hip Exercises: Machines for Strengthening   Cybex Knee Extension  10# 2x10    Cybex Knee Flexion  35# 2x10    Cybex Leg Press  40# 2x15, single legs 20# x10    Other Machine  35# seated row, lats pulls 25# 2x10, 10# shoulder press, 25# triceps, 15# biceps 2x15, 25# squat row 2x 10      Knee/Hip Exercises: Standing   Other Standing Knee Exercises  Functional box lifts 20lb 2x10                   PT Long Term Goals - 04/16/19 1056      PT LONG TERM GOAL #1   Title  Pt will demonstrate independence with balance and strengthening HEP    Status  Partially Met      PT LONG TERM GOAL #2   Title  Pt will improve FGA to 30/30    Status  Partially Met      PT LONG TERM GOAL #4   Title  Pt will demonstrate ability to safely negotiate a ladder    Status  On-going            Plan - 04/23/19 1103    Clinical Impression Statement  pt. able to push through exercises with very minimal signs of fatigue, he readily goes up in difficulty and completes all exercises. pt is limited by muscle weakness but not significantly.    Comorbidities  tonsil cancer with radiation and chemotherapy in 2005, cardiomyopathy, atrial flutter, hypothyroidism, diverticulitis with abcess, CAD, and C5 laminectomy    Rehab Potential  Good    PT Frequency  1x / week    PT Duration  6 weeks    PT Treatment/Interventions  ADLs/Self Care Home Management;Gait training;Stair training;Functional mobility training;Therapeutic activities;Therapeutic exercise;Balance training;Neuromuscular re-education;Patient/family education;Other (comment)    PT Next Visit Plan  continue to see him 2x a week to prepare him for his job.       Patient will benefit from skilled therapeutic intervention in order to improve the following deficits and impairments:  Decreased balance, Decreased endurance, Difficulty walking, Decreased strength  Visit Diagnosis: Other lack of  coordination  Muscle weakness (generalized)     Problem List Patient Active Problem List   Diagnosis Date Noted  . Hypokalemia 03/12/2019  . Expressive aphasia 03/10/2019  . Dysphagia 03/10/2019  . Middle cerebral artery embolism, left 03/09/2019  . Diverticular stricture ([place]) 03/07/2019  . Bacteria in urine   . Chronic atrial fibrillation 12/02/2018  . Current use of long term anticoagulation 12/02/2018  . UTI (urinary tract infection) 11/29/2018  . Hyponatremia 11/29/2018  . PAF (paroxysmal atrial fibrillation) ([place]) 11/29/2018  . Diverticulitis of large intestine with abscess 09/23/2018  . Dyslipidemia 12/05/2017  . CAD (coronary artery disease), native coronary artery 12/06/2015  .  SOB (shortness of breath)   . NICM (nonischemic cardiomyopathy) ([place])   . Thyroid activity decreased   . Hypothyroidism 08/12/2013  . History of radiation therapy 05/30/2012  . Smokes tobacco daily 05/30/2012  . Cancer of tonsillar fossa ([place]) 11/27/2011  . Tonsil cancer ([place]) 2005  . History of chemotherapy 2005    Vickii Chafe 04/23/2019, 11:05 AM  [facility] [place], [place], [address] Phone: [phone]   Fax:  [phone]  Name: Adrian [name] MRN: [id number] Date of Birth: 25-Jul-1956

## 2019-04-23 NOTE — Therapy (Deleted)
[facility] [place], [place], [address] Phone: [phone]   Fax:  [phone]  Physical Therapy Treatment  Patient Details  Name: Adrian [name] MRN: [id number] Date of Birth: 1956-07-28 Referring Provider (PT): [name], MD (PCP) referred to therapy by NP at hospital   Encounter Date: 04/23/2019  PT End of Session - 04/23/19 1053    Visit Number  5    Date for PT Re-Evaluation  05/11/19    PT Start Time  2876    PT Stop Time  1055    PT Time Calculation (min)  40 min       Past Medical History:  Diagnosis Date  . AF (paroxysmal atrial fibrillation) ([place]) 11/29/2018  . Atrial flutter ([place]) 10/12/2015  . CAD (coronary artery disease), native coronary artery 12/06/2015   a. 10/2015 MV: EF  37%, reversible defect inferior apex, intermediate risk findings; b. 10/2015 Cath: 20% mid RCA.  . Colonic diverticular abscess   . Diverticulitis   . History of chemotherapy 2005   Cisplatin  . Hypothyroidism   . NICM (nonischemic cardiomyopathy) ([place]) 10/14/2015   a. Tachy mediated?;  b. Echo 3/17 - Mild concentric LVH, EF 30-35%, anteroseptal, anterior, anterolateral, apical anterior, lateral hypokinesis, trivial MR, mild to moderately reduced RVSF; c. LHC 3/17 - mRCA 20%  . Paroxysmal atrial flutter ([place])    a. TEE 3/17 with ? LAA clot-->s/p TEE/DCCV 11/24/2015  . Radiation NOv.3,2005-Dec. 15, 2005   6810 cGy in 30 fractions  . 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  XRT    Past Surgical History:  Procedure Laterality Date  . APPENDECTOMY N/A 03/07/2019   Procedure: ROBOTIC ASSISTED APPENDECTOMY;  Surgeon: Michael Boston, MD;  [facility];  Service: General;  Laterality: N/A;  . CARDIAC CATHETERIZATION N/A 10/18/2015   Procedure: Left Heart Cath and Coronary Angiography;  Surgeon: Burnell Blanks, MD;  Location: [place] [facility];  Service: Cardiovascular;  Laterality: N/A;  . CARDIOVERSION N/A  11/24/2015   Procedure: CARDIOVERSION;  Surgeon: Josue Hector, MD;  Location: [facility];  Service: Cardiovascular;  Laterality: N/A;  . CYSTOSCOPY WITH STENT PLACEMENT Bilateral 03/07/2019   Procedure: CYSTOSCOPY WITH BILATERAL FIREFLY INJECTION;  Surgeon: Ardis Hughs, MD;  [facility];  Service: Urology;  Laterality: Bilateral;  . GASTROSTOMY TUBE PLACEMENT  07/04/2004   IR - G tube for tonsilar cancer  . IR ANGIO INTRA EXTRACRAN SEL COM CAROTID INNOMINATE UNI R MOD SED  03/09/2019  . IR ANGIO VERTEBRAL SEL VERTEBRAL UNI R MOD SED  03/09/2019  . IR CT HEAD LTD  03/09/2019  . IR PERCUTANEOUS ART THROMBECTOMY/INFUSION INTRACRANIAL INC DIAG ANGIO  03/09/2019  . IR RADIOLOGIST EVAL & MGMT  12/18/2018  . LAMINECTOMY     C5/placement of steel plate  . NECK SURGERY  2003   replaced disk  . RADIOLOGY WITH ANESTHESIA N/A 03/08/2019   Procedure: IR WITH ANESTHESIA;  Surgeon: Luanne Bras, MD;  Location: [place];  Service: Radiology;  Laterality: N/A;  . TEE WITHOUT CARDIOVERSION N/A 10/15/2015   Procedure: TRANSESOPHAGEAL ECHOCARDIOGRAM (TEE);  Surgeon: Larey Dresser, MD;  Location: [place];  Service: Cardiovascular;  Laterality: N/A;  . TEE WITHOUT CARDIOVERSION N/A 11/24/2015   Procedure: TRANSESOPHAGEAL ECHOCARDIOGRAM (TEE);  Surgeon: Josue Hector, MD;  Location: [place];  Service: Cardiovascular;  Laterality: N/A;  . XI ROBOTIC ASSISTED COLOSTOMY TAKEDOWN N/A 03/07/2019   Procedure: XI ROBOTIC ASSISTED  LOW ANTERIOR RESECTION, RIGID PROCTOSCOPY;  Surgeon: Michael Boston, MD;  [facility];  Service: General;  Laterality: N/A;    There were no vitals filed for this visit.  Subjective Assessment - 04/23/19 1017    Subjective  patient doing well, said OT is going well but thinking about cesation of attendance of OT    Currently in Pain?  No/denies                       [facility] Adult PT Treatment/Exercise - 04/23/19 0001      Knee/Hip Exercises: Aerobic    Nustep  level 5 x 5 minutes      Knee/Hip Exercises: Machines for Strengthening   Cybex Knee Extension  10# 2x10    Cybex Knee Flexion  35# 2x10    Cybex Leg Press  40# 2x15, single legs 20# x10    Other Machine  35# seated row, lats pulls 25# 2x10, 10# shoulder press, 25# triceps, 15# biceps 2x15, 25# squat row 2x 10      Knee/Hip Exercises: Standing   Other Standing Knee Exercises  Functional box lifts 20lb 2x10   Attempted SL deadliftts, very  unstable                 PT Long Term Goals - 04/16/19 1056      PT LONG TERM GOAL #1   Title  Pt will demonstrate independence with balance and strengthening HEP    Status  Partially Met      PT LONG TERM GOAL #2   Title  Pt will improve FGA to 30/30    Status  Partially Met      PT LONG TERM GOAL #4   Title  Pt will demonstrate ability to safely negotiate a ladder    Status  On-going            Plan - 04/23/19 1054    PT Next Visit Plan  continue to see him 2x a week to prepare him for his job.       Patient will benefit from skilled therapeutic intervention in order to improve the following deficits and impairments:  Decreased balance, Decreased endurance, Difficulty walking, Decreased strength  Visit Diagnosis: Other lack of coordination  Muscle weakness (generalized)     Problem List Patient Active Problem List   Diagnosis Date Noted  . Hypokalemia 03/12/2019  . Expressive aphasia 03/10/2019  . Dysphagia 03/10/2019  . Middle cerebral artery embolism, left 03/09/2019  . Diverticular stricture ([place]) 03/07/2019  . Bacteria in urine   . Chronic atrial fibrillation 12/02/2018  . Current use of long term anticoagulation 12/02/2018  . UTI (urinary tract infection) 11/29/2018  . Hyponatremia 11/29/2018  . PAF (paroxysmal atrial fibrillation) ([place]) 11/29/2018  . Diverticulitis of large intestine with abscess 09/23/2018  . Dyslipidemia 12/05/2017  . CAD (coronary artery disease), native coronary artery  12/06/2015  . SOB (shortness of breath)   . NICM (nonischemic cardiomyopathy) ([place])   . Thyroid activity decreased   . Hypothyroidism 08/12/2013  . History of radiation therapy 05/30/2012  . Smokes tobacco daily 05/30/2012  . Cancer of tonsillar fossa ([place]) 11/27/2011  . Tonsil cancer ([place]) 2005  . History of chemotherapy 2005    Vickii Chafe 04/23/2019, 10:56 AM  [facility], [place], [address] Phone: [phone]   Fax:  [phone]  Name: JOVANTE HAMMITT MRN: [id number] Date of  Birth: February 14, 1956

## 2019-04-24 ENCOUNTER — Ambulatory Visit: Payer: BC Managed Care – PPO | Admitting: Occupational Therapy

## 2019-04-24 ENCOUNTER — Ambulatory Visit: Payer: BC Managed Care – PPO | Admitting: Speech Pathology

## 2019-04-24 DIAGNOSIS — I69318 Other symptoms and signs involving cognitive functions following cerebral infarction: Secondary | ICD-10-CM

## 2019-04-24 DIAGNOSIS — M6281 Muscle weakness (generalized): Secondary | ICD-10-CM

## 2019-04-24 DIAGNOSIS — R1312 Dysphagia, oropharyngeal phase: Secondary | ICD-10-CM | POA: Diagnosis not present

## 2019-04-24 DIAGNOSIS — R471 Dysarthria and anarthria: Secondary | ICD-10-CM

## 2019-04-24 DIAGNOSIS — R278 Other lack of coordination: Secondary | ICD-10-CM

## 2019-04-24 DIAGNOSIS — R4701 Aphasia: Secondary | ICD-10-CM

## 2019-04-24 DIAGNOSIS — R41841 Cognitive communication deficit: Secondary | ICD-10-CM

## 2019-04-24 NOTE — Patient Instructions (Signed)
Scapular Retraction: Abduction / Extension (Prone)    Lie with arms out from sides 90. Pinch shoulder blades together and raise arms a few inches from floor. Repeat _10___ times per set. Do _1___ sets per session. Do __2__ sessions per day.   Extension - Prone (Dumbbell)    Lie with right arm hanging off side of bed. Lift hand back and up. Repeat __10__ times per set. Do __1__ sets per session. Do __2__ sessions per day. Use _2___ lb weight.   (Clinic) Retraction: Row - Bilateral ([place])    Facing pulley, arms reaching forward, pull hands toward stomach, pinching shoulder blades together. Repeat _10___ times per set. Do _1___ sets per session. Do ____ sessions per day. Use theraband   Healthy Back - Cat Stretch    With hands and knees apart, and looking down at floor, arch your back downward as much as possible, then pull stomach in to make back rounded. Use smooth, continuous movements, with no jerking or straining. Do __5-10__ times.   Prone Plank (Eccentric)    On toes and elbows, pull abdomen in while stabilizing trunk. Slowly lower downward without arching back. Hold 5 sec,  _5__ reps per set, _2__ sets per day.

## 2019-04-24 NOTE — Therapy (Signed)
[facility] [address] [place], [place], [address] Phone: [phone]   Fax:  [phone]  Speech Language Pathology Treatment  Patient Details  Name: Adrian [name] MRN: [id number] Date of Birth: 1955-09-23 Referring Provider (SLP): [name], MD (PCP) referred to therapy by NP at hospital   Encounter Date: 04/24/2019  End of Session - 04/24/19 1314    Visit Number  8    Number of Visits  17    Date for SLP Re-Evaluation  05/24/19    Authorization Type  BCBS    SLP Start Time  1147    SLP Stop Time   1230    SLP Time Calculation (min)  43 min    Activity Tolerance  Patient tolerated treatment well       Past Medical History:  Diagnosis Date  . AF (paroxysmal atrial fibrillation) ([place]) 11/29/2018  . Atrial flutter ([place]) 10/12/2015  . CAD (coronary artery disease), native coronary artery 12/06/2015   a. 10/2015 MV: EF  37%, reversible defect inferior apex, intermediate risk findings; b. 10/2015 Cath: 20% mid RCA.  . Colonic diverticular abscess   . Diverticulitis   . History of chemotherapy 2005   Cisplatin  . Hypothyroidism   . NICM (nonischemic cardiomyopathy) ([place]) 10/14/2015   a. Tachy mediated?;  b. Echo 3/17 - Mild concentric LVH, EF 30-35%, anteroseptal, anterior, anterolateral, apical anterior, lateral hypokinesis, trivial MR, mild to moderately reduced RVSF; c. LHC 3/17 - mRCA 20%  . Paroxysmal atrial flutter ([place])    a. TEE 3/17 with ? LAA clot-->s/p TEE/DCCV 11/24/2015  . Radiation NOv.3,2005-Dec. 15, 2005   6810 cGy in 30 fractions  . Tonsil cancer [facility]) 2005   Dr Romeo Rabon.  XRT    Past Surgical History:  Procedure Laterality Date  . APPENDECTOMY N/A 03/07/2019   Procedure: ROBOTIC ASSISTED APPENDECTOMY;  Surgeon: Michael Boston, MD;  [facility];  Service: General;  Laterality: N/A;  . CARDIAC CATHETERIZATION N/A 10/18/2015   Procedure: Left Heart Cath and Coronary Angiography;  Surgeon:  Burnell Blanks, MD;  Location: [facility];  Service: Cardiovascular;  Laterality: N/A;  . CARDIOVERSION N/A 11/24/2015   Procedure: CARDIOVERSION;  Surgeon: Josue Hector, MD;  Location: [facility];  Service: Cardiovascular;  Laterality: N/A;  . CYSTOSCOPY WITH STENT PLACEMENT Bilateral 03/07/2019   Procedure: CYSTOSCOPY WITH BILATERAL FIREFLY INJECTION;  Surgeon: Ardis Hughs, MD;  [facility];  Service: Urology;  Laterality: Bilateral;  . GASTROSTOMY TUBE PLACEMENT  07/04/2004   IR - G tube for tonsilar cancer  . IR ANGIO INTRA EXTRACRAN SEL COM CAROTID INNOMINATE UNI R MOD SED  03/09/2019  . IR ANGIO VERTEBRAL SEL VERTEBRAL UNI R MOD SED  03/09/2019  . IR CT HEAD LTD  03/09/2019  . IR PERCUTANEOUS ART THROMBECTOMY/INFUSION INTRACRANIAL INC DIAG ANGIO  03/09/2019  . IR RADIOLOGIST EVAL & MGMT  12/18/2018  . LAMINECTOMY     C5/placement of steel plate  . NECK SURGERY  2003   replaced disk  . RADIOLOGY WITH ANESTHESIA N/A 03/08/2019   Procedure: IR WITH ANESTHESIA;  Surgeon: Luanne Bras, MD;  Location: [facility];  Service: Radiology;  Laterality: N/A;  . TEE WITHOUT CARDIOVERSION N/A 10/15/2015   Procedure: TRANSESOPHAGEAL ECHOCARDIOGRAM (TEE);  Surgeon: Larey Dresser, MD;  Location: [facility];  Service: Cardiovascular;  Laterality: N/A;  . TEE WITHOUT CARDIOVERSION N/A 11/24/2015   Procedure: TRANSESOPHAGEAL ECHOCARDIOGRAM (TEE);  Surgeon: Josue Hector, MD;  [facility];  Service: Cardiovascular;  Laterality: N/A;  . XI ROBOTIC ASSISTED COLOSTOMY TAKEDOWN N/A 03/07/2019   Procedure: XI ROBOTIC ASSISTED LOW ANTERIOR RESECTION, RIGID PROCTOSCOPY;  Surgeon: Michael Boston, MD;  [facility];  Service: General;  Laterality: N/A;    There were no vitals filed for this visit.         ADULT SLP TREATMENT - 04/24/19 1313      General Information   Behavior/Cognition  Alert;Cooperative;Pleasant mood      Treatment Provided   Treatment provided   Cognitive-Linquistic      Cognitive-Linquistic Treatment   Treatment focused on  Aphasia    Skilled Treatment  10 minutes simple-mod complex conversation re: patient's progress with verbal communication and his grandchildren was functional with extended time, slow rate. Pt expressed frustration with his written expression, so SLP worked with pt today on writing at word level. Significant aphasic errors, of which pt aware ~50% of the time. Goal for written expression added. SLP stressed today the importance of completing work/exercises outside of therapy in order to gain improvements with both verbal and written expression. Encouraged pt to obtain a binder to organize papers from therapy. Provided several worksheets for home practice with writing/language as pt does not have interest in using tablet or computer for online activities.       Assessment / Recommendations / Plan   Plan  Continue with current plan of care;Goals updated      Progression Toward Goals   Progression toward goals  Progressing toward goals         SLP Short Term Goals - 04/24/19 1304      SLP SHORT TERM GOAL #1   Title  Pt will demo HEP for dysphagia with rare min A x 3 sessions.    Time  1    Period  Weeks    Status  Partially Met      SLP SHORT TERM GOAL #2   Title  Pt will complete further assessment of cognition and writing and goals added as appropriate, within first 4 visits.    Status  Achieved      SLP SHORT TERM GOAL #3   Title  Pt will utitlize multimodal communication/compensations for anomia in 8 minutes simple-mod complex conversation over 3 sessions with occasional min A    Baseline  04/15/19 04/17/19 04/24/19    Time  1    Period  Weeks    Status  Achieved      SLP SHORT TERM GOAL #4   Title  Pt will alternate attention between 2 functional tasks for 12 minutes >90% accuracy x 2 sessions with compensations allowed.    Baseline  04/17/19    Time  4    Period  Weeks    Status  Partially Met        SLP Long Term Goals - 04/24/19 1305      SLP LONG TERM GOAL #1   Title  Pt will complete HEP for dysphagia with modified independence x 3 sessions.    Time  5    Period  Weeks    Status  On-going      SLP LONG TERM GOAL #2   Title  Pt will participate in repeat MBS for objective assessment of swallow function to determine readiness for diet advancement.    Time  5    Period  Weeks    Status  Deferred   on hold as pt is electing to take thin  liquids at this time; will continue to monitor clinically     SLP LONG TERM GOAL #3   Title  Pt will utitlize multimodal communication/compensations for anomia in 12 minutes mod complex conversation over 3 sessions with occasional min A    Time  5    Period  Weeks    Status  On-going      SLP LONG TERM GOAL #4   Title  Pt will demo functional divided attention for 15 minutes x2 sessions, >95% accuracy on tasks with double checking allowed, x2 sessions.    Time  5    Period  Weeks    Status  New      SLP LONG TERM GOAL #5   Title  Patient will compose written text messages and/or brief email responses using compensations (speech-to-text).    Time  4    Period  Weeks    Status  New       Plan - 04/24/19 1314    Clinical Impression Statement  Mr. Fackler cont to present with mild anomic aphasia, mild dysarthria and oropharyngeal dysphagia. Pt was upgraded to "runny nectar" previously by SLP. He is electing to take thin liquids at this time. SLP reinforced need to use throat clear, effortful swallow, and s/sx of aspiration PNA. Simple-mod complex conversation functional today; pt expressed frustration with his writing, which is impaired at word level. Home tasks given for this today. I recommend cont'd skilled ST to address communication and swallowing impairments, in order to maximize pt's independence and safety, increase his ability to communicate wants and needs, and improve quality of life.    Speech Therapy Frequency  2x / week     Duration  --   8 weeks/17 visits   Treatment/Interventions  Aspiration precaution training;Diet toleration management by SLP;Environmental controls;Trials of upgraded texture/liquids;Cognitive reorganization;Oral motor exercises;Multimodal communcation approach;Compensatory strategies;Pharyngeal strengthening exercises;Language facilitation;Compensatory techniques;Cueing hierarchy;Internal/external aids;Functional tasks;SLP instruction and feedback;Patient/family education    Potential to Achieve Goals  Good    Consulted and Agree with Plan of Care  Patient       Patient will benefit from skilled therapeutic intervention in order to improve the following deficits and impairments:   Aphasia  Cognitive communication deficit  Dysarthria and anarthria  Dysphagia, oropharyngeal phase    Problem List Patient Active Problem List   Diagnosis Date Noted  . Hypokalemia 03/12/2019  . Expressive aphasia 03/10/2019  . Dysphagia 03/10/2019  . Middle cerebral artery embolism, left 03/09/2019  . Diverticular stricture ([place]) 03/07/2019  . Bacteria in urine   . Chronic atrial fibrillation 12/02/2018  . Current use of long term anticoagulation 12/02/2018  . UTI (urinary tract infection) 11/29/2018  . Hyponatremia 11/29/2018  . PAF (paroxysmal atrial fibrillation) ([place]) 11/29/2018  . Diverticulitis of large intestine with abscess 09/23/2018  . Dyslipidemia 12/05/2017  . CAD (coronary artery disease), native coronary artery 12/06/2015  . SOB (shortness of breath)   . NICM (nonischemic cardiomyopathy) ([place])   . Thyroid activity decreased   . Hypothyroidism 08/12/2013  . History of radiation therapy 05/30/2012  . Smokes tobacco daily 05/30/2012  . Cancer of tonsillar fossa ([place]) 11/27/2011  . Tonsil cancer ([place]) 2005  . History of chemotherapy 2005   Deneise Lever, [place], CCC-SLP Speech-Language Pathologist   Aliene Altes 04/24/2019, 1:17 PM  [facility] [address] [place], [place], [address] Phone: [phone]   Fax:  [phone]   Name: Adrian [name] MRN: [id number] Date of  Birth: Apr 14, 1956

## 2019-04-24 NOTE — Patient Instructions (Signed)
See if you can put together a binder or folder to organize and keep your therapy activities (sections for OT, PT and Speech).   Practice your writing and/or speech every day. 30 minutes at least. You have to use it to improve it!

## 2019-04-24 NOTE — Therapy (Signed)
[facility] [address] [place], [place], [address] Phone: [phone]   Fax:  [phone]  Occupational Therapy Treatment  Patient Details  Name: Adrian [name] MRN: [id number] Date of Birth: 02-Jan-1956 Referring Provider (OT): Carolann Littler   Encounter Date: 04/24/2019  OT End of Session - 04/24/19 1317    Visit Number  6    Number of Visits  9    Authorization Type  BC/BS    OT Start Time  1230    OT Stop Time  1315    OT Time Calculation (min)  45 min    Activity Tolerance  Patient tolerated treatment well    Behavior During Therapy  [facility] for tasks assessed/performed       Past Medical History:  Diagnosis Date  . AF (paroxysmal atrial fibrillation) ([place]) 11/29/2018  . Atrial flutter ([place]) 10/12/2015  . CAD (coronary artery disease), native coronary artery 12/06/2015   a. 10/2015 MV: EF  37%, reversible defect inferior apex, intermediate risk findings; b. 10/2015 Cath: 20% mid RCA.  . Colonic diverticular abscess   . Diverticulitis   . History of chemotherapy 2005   Cisplatin  . Hypothyroidism   . NICM (nonischemic cardiomyopathy) ([place]) 10/14/2015   a. Tachy mediated?;  b. Echo 3/17 - Mild concentric LVH, EF 30-35%, anteroseptal, anterior, anterolateral, apical anterior, lateral hypokinesis, trivial MR, mild to moderately reduced RVSF; c. LHC 3/17 - mRCA 20%  . Paroxysmal atrial flutter ([place])    a. TEE 3/17 with ? LAA clot-->s/p TEE/DCCV 11/24/2015  . Radiation NOv.3,2005-Dec. 15, 2005   6810 cGy in 30 fractions  . Tonsil cancer [facility]) 2005   Dr Romeo Rabon.  XRT    Past Surgical History:  Procedure Laterality Date  . APPENDECTOMY N/A 03/07/2019   Procedure: ROBOTIC ASSISTED APPENDECTOMY;  Surgeon: Michael Boston, MD;  [facility];  Service: General;  Laterality: N/A;  . CARDIAC CATHETERIZATION N/A 10/18/2015   Procedure: Left Heart Cath and Coronary Angiography;  Surgeon: Burnell Blanks, MD;  Location:  [place] [facility];  Service: Cardiovascular;  Laterality: N/A;  . CARDIOVERSION N/A 11/24/2015   Procedure: CARDIOVERSION;  Surgeon: Josue Hector, MD;  Location: [facility];  Service: Cardiovascular;  Laterality: N/A;  . CYSTOSCOPY WITH STENT PLACEMENT Bilateral 03/07/2019   Procedure: CYSTOSCOPY WITH BILATERAL FIREFLY INJECTION;  Surgeon: Ardis Hughs, MD;  [facility];  Service: Urology;  Laterality: Bilateral;  . GASTROSTOMY TUBE PLACEMENT  07/04/2004   IR - G tube for tonsilar cancer  . IR ANGIO INTRA EXTRACRAN SEL COM CAROTID INNOMINATE UNI R MOD SED  03/09/2019  . IR ANGIO VERTEBRAL SEL VERTEBRAL UNI R MOD SED  03/09/2019  . IR CT HEAD LTD  03/09/2019  . IR PERCUTANEOUS ART THROMBECTOMY/INFUSION INTRACRANIAL INC DIAG ANGIO  03/09/2019  . IR RADIOLOGIST EVAL & MGMT  12/18/2018  . LAMINECTOMY     C5/placement of steel plate  . NECK SURGERY  2003   replaced disk  . RADIOLOGY WITH ANESTHESIA N/A 03/08/2019   Procedure: IR WITH ANESTHESIA;  Surgeon: Luanne Bras, MD;  Location: [facility];  Service: Radiology;  Laterality: N/A;  . TEE WITHOUT CARDIOVERSION N/A 10/15/2015   Procedure: TRANSESOPHAGEAL ECHOCARDIOGRAM (TEE);  Surgeon: Larey Dresser, MD;  Location: [facility];  Service: Cardiovascular;  Laterality: N/A;  . TEE WITHOUT CARDIOVERSION N/A 11/24/2015   Procedure: TRANSESOPHAGEAL ECHOCARDIOGRAM (TEE);  Surgeon: Josue Hector, MD;  Location: [facility];  Service: Cardiovascular;  Laterality: N/A;  .  XI ROBOTIC ASSISTED COLOSTOMY TAKEDOWN N/A 03/07/2019   Procedure: XI ROBOTIC ASSISTED LOW ANTERIOR RESECTION, RIGID PROCTOSCOPY;  Surgeon: Michael Boston, MD;  [facility];  Service: General;  Laterality: N/A;    There were no vitals filed for this visit.  Subjective Assessment - 04/24/19 1233    Subjective   No changes    Pertinent History  Lt MCA CVA 03/08/19. PMH: A-FIB, CAD, h/o CA    Currently in Pain?  No/denies        Bilateral sh flexion holding 5 lb weight  w/ both palms facing each other x 10 reps. Overhead press bilateral UE's w/ 2 lb weight in each hand x 10 reps.  Therapist assessed scapula and noted mildly increased winging and posterior weakness compared to LUE - therefore issued HEP to address this - see pt instructions for details.  Pt also performed BUE wt bearing for scapula retraction and downward depression while bridging off mat from seated position. Wall push ups x 10 reps UBE x 8 min. Level 8 resistance (4 min forward, 4 min backwards)                    OT Education - 04/24/19 1316    Education Details  posterior shoulder girdle strengthening and scapula stabilization HEP    Person(s) Educated  Patient    Methods  Explanation;Demonstration;Handout    Comprehension  Verbalized understanding;Returned demonstration          OT Long Term Goals - 04/21/19 1021      OT LONG TERM GOAL #1   Title  Independent with HEP for Rt hand coordination and Rt shoulder strength    Time  4    Period  Weeks    Status  Achieved      OT LONG TERM GOAL #2   Title  Improve coordination Rt hand to 25 sec. or less on 9 hole peg test    Baseline  27 sec    Time  4    Period  Weeks    Status  On-going      OT LONG TERM GOAL #3   Title  Pt to perform medication management w/ external aids/cues prn I'ly after placed in pillbox by family    Time  4    Period  Weeks    Status  Achieved      OT LONG TERM GOAL #4   Title  Pt to lift up to 50 # BUE's and carrying short distance using proper body mechanics in prep for return to work    Time  4    Period  Weeks    Status  On-going            Plan - 04/24/19 1319    Clinical Impression Statement  Pt noted to have mild Rt shoulder girdle instability, decr.posterior sh. strength, and slightly increased winging compared to Lt scapula (however pt does have mild winging Lt non involved scapula)    Occupational performance deficits (Please refer to evaluation for details):   IADL's;Work    Body Structure / Function / Physical Skills  IADL;FMC;Coordination;Strength;UE functional use;Body mechanics;Vision;Mobility    Cognitive Skills  Memory    Rehab Potential  Good    OT Frequency  2x / week    OT Duration  4 weeks    OT Treatment/Interventions  Therapeutic exercise;Self-care/ADL training;Neuromuscular education;Therapeutic activities;Coping strategies training;Cognitive remediation/compensation;Visual/perceptual remediation/compensation;Patient/family education;Passive range of motion    Plan  environmental scanning, continue work  related tasks and UB strengthening, review latest HEP prn    Consulted and Agree with Plan of Care  Patient       Patient will benefit from skilled therapeutic intervention in order to improve the following deficits and impairments:   Body Structure / Function / Physical Skills: IADL, FMC, Coordination, Strength, UE functional use, Body mechanics, Vision, Mobility Cognitive Skills: Memory     Visit Diagnosis: Muscle weakness (generalized)  Other lack of coordination  Other symptoms and signs involving cognitive functions following cerebral infarction    Problem List Patient Active Problem List   Diagnosis Date Noted  . Hypokalemia 03/12/2019  . Expressive aphasia 03/10/2019  . Dysphagia 03/10/2019  . Middle cerebral artery embolism, left 03/09/2019  . Diverticular stricture ([place]) 03/07/2019  . Bacteria in urine   . Chronic atrial fibrillation 12/02/2018  . Current use of long term anticoagulation 12/02/2018  . UTI (urinary tract infection) 11/29/2018  . Hyponatremia 11/29/2018  . PAF (paroxysmal atrial fibrillation) ([place]) 11/29/2018  . Diverticulitis of large intestine with abscess 09/23/2018  . Dyslipidemia 12/05/2017  . CAD (coronary artery disease), native coronary artery 12/06/2015  . SOB (shortness of breath)   . NICM (nonischemic cardiomyopathy) ([place])   . Thyroid activity decreased   . Hypothyroidism  08/12/2013  . History of radiation therapy 05/30/2012  . Smokes tobacco daily 05/30/2012  . Cancer of tonsillar fossa ([place]) 11/27/2011  . Tonsil cancer ([place]) 2005  . History of chemotherapy 2005    Carey Bullocks, OTR/L 04/24/2019, 2:35 PM  [facility] [address] [place], [place], [address] Phone: [phone]   Fax:  [phone]  Name: Adrian [name] MRN: [id number] Date of Birth: 01/20/1956

## 2019-04-25 ENCOUNTER — Ambulatory Visit: Payer: BC Managed Care – PPO | Admitting: Physical Therapy

## 2019-04-25 ENCOUNTER — Encounter: Payer: Self-pay | Admitting: Physical Therapy

## 2019-04-25 ENCOUNTER — Other Ambulatory Visit: Payer: Self-pay

## 2019-04-25 DIAGNOSIS — M6281 Muscle weakness (generalized): Secondary | ICD-10-CM

## 2019-04-25 DIAGNOSIS — I69318 Other symptoms and signs involving cognitive functions following cerebral infarction: Secondary | ICD-10-CM

## 2019-04-25 DIAGNOSIS — R1312 Dysphagia, oropharyngeal phase: Secondary | ICD-10-CM | POA: Diagnosis not present

## 2019-04-25 DIAGNOSIS — R278 Other lack of coordination: Secondary | ICD-10-CM

## 2019-04-25 NOTE — Therapy (Signed)
[place], [place], [address] Phone: [phone]   Fax:  [phone]  Physical Therapy Treatment  Patient Details  Name: Adrian [name] MRN: [id number] Date of Birth: 1956-02-10 Referring Provider (PT): [name], MD (PCP) referred to therapy by NP at hospital   Encounter Date: 04/25/2019  PT End of Session - 04/25/19 1059    Visit Number  6    Number of Visits  7    Date for PT Re-Evaluation  05/11/19    PT Start Time  5638    PT Stop Time  1059    PT Time Calculation (min)  44 min    Activity Tolerance  Patient tolerated treatment well    Behavior During Therapy  [facility] for tasks assessed/performed       Past Medical History:  Diagnosis Date  . AF (paroxysmal atrial fibrillation) ([place]) 11/29/2018  . Atrial flutter ([place]) 10/12/2015  . CAD (coronary artery disease), native coronary artery 12/06/2015   a. 10/2015 MV: EF  37%, reversible defect inferior apex, intermediate risk findings; b. 10/2015 Cath: 20% mid RCA.  . Colonic diverticular abscess   . Diverticulitis   . History of chemotherapy 2005   Cisplatin  . Hypothyroidism   . NICM (nonischemic cardiomyopathy) ([place]) 10/14/2015   a. Tachy mediated?;  b. Echo 3/17 - Mild concentric LVH, EF 30-35%, anteroseptal, anterior, anterolateral, apical anterior, lateral hypokinesis, trivial MR, mild to moderately reduced RVSF; c. LHC 3/17 - mRCA 20%  . Paroxysmal atrial flutter ([place])    a. TEE 3/17 with ? LAA clot-->s/p TEE/DCCV 11/24/2015  . Radiation NOv.3,2005-Dec. 15, 2005   6810 cGy in 30 fractions  . Tonsil cancer [facility]) 2005   Dr Romeo Rabon.  XRT    Past Surgical History:  Procedure Laterality Date  . APPENDECTOMY N/A 03/07/2019   Procedure: ROBOTIC ASSISTED APPENDECTOMY;  Surgeon: Michael Boston, MD;  [facility];  Service: General;  Laterality: N/A;  . CARDIAC CATHETERIZATION N/A 10/18/2015   Procedure: Left Heart Cath and Coronary  Angiography;  Surgeon: Burnell Blanks, MD;  Location: [place] [facility];  Service: Cardiovascular;  Laterality: N/A;  . CARDIOVERSION N/A 11/24/2015   Procedure: CARDIOVERSION;  Surgeon: Josue Hector, MD;  Location: [place];  Service: Cardiovascular;  Laterality: N/A;  . CYSTOSCOPY WITH STENT PLACEMENT Bilateral 03/07/2019   Procedure: CYSTOSCOPY WITH BILATERAL FIREFLY INJECTION;  Surgeon: Ardis Hughs, MD;  [facility];  Service: Urology;  Laterality: Bilateral;  . GASTROSTOMY TUBE PLACEMENT  07/04/2004   IR - G tube for tonsilar cancer  . IR ANGIO INTRA EXTRACRAN SEL COM CAROTID INNOMINATE UNI R MOD SED  03/09/2019  . IR ANGIO VERTEBRAL SEL VERTEBRAL UNI R MOD SED  03/09/2019  . IR CT HEAD LTD  03/09/2019  . IR PERCUTANEOUS ART THROMBECTOMY/INFUSION INTRACRANIAL INC DIAG ANGIO  03/09/2019  . IR RADIOLOGIST EVAL & MGMT  12/18/2018  . LAMINECTOMY     C5/placement of steel plate  . NECK SURGERY  2003   replaced disk  . RADIOLOGY WITH ANESTHESIA N/A 03/08/2019   Procedure: IR WITH ANESTHESIA;  Surgeon: Luanne Bras, MD;  Location: [place];  Service: Radiology;  Laterality: N/A;  . TEE WITHOUT CARDIOVERSION N/A 10/15/2015   Procedure: TRANSESOPHAGEAL ECHOCARDIOGRAM (TEE);  Surgeon: Larey Dresser, MD;  Location: [place];  Service: Cardiovascular;  Laterality: N/A;  . TEE WITHOUT CARDIOVERSION N/A 11/24/2015   Procedure: TRANSESOPHAGEAL ECHOCARDIOGRAM (TEE);  Surgeon:  Josue Hector, MD;  Location: [place];  Service: Cardiovascular;  Laterality: N/A;  . XI ROBOTIC ASSISTED COLOSTOMY TAKEDOWN N/A 03/07/2019   Procedure: XI ROBOTIC ASSISTED LOW ANTERIOR RESECTION, RIGID PROCTOSCOPY;  Surgeon: Michael Boston, MD;  [facility];  Service: General;  Laterality: N/A;    There were no vitals filed for this visit.  Subjective Assessment - 04/25/19 1018    Subjective  "Pretty good"    Currently in Pain?  No/denies                       [facility] Adult PT  Treatment/Exercise - 04/25/19 0001      Knee/Hip Exercises: Aerobic   Elliptical  I8 R 5 x4 min     Nustep  level 6 x 4 minutes    Other Aerobic  farmers carry with 2 flights of stairs 7lb dumbbells      Knee/Hip Exercises: Machines for Strengthening   Cybex Knee Extension  15lb 2x10, RLE 5lb x10    Cybex Knee Flexion  35# 2x10, Single leg 15lb x15     Cybex Leg Press  60# 2x10, single legs 40# x10    Other Machine  35# seated row, lats pulls 25# 2x10, 20lb chest press 2x10, 10# shoulder press, 25# triceps, 15# biceps 2x15, 25# squat row 2x 10      Knee/Hip Exercises: Standing   Other Standing Knee Exercises  Bottom loaded squat 42.5lb 2x10       Knee/Hip Exercises: Seated   Sit to Sand  2 sets;10 reps;without UE support   Overhead with yellow ball                  PT Long Term Goals - 04/16/19 1056      PT LONG TERM GOAL #1   Title  Pt will demonstrate independence with balance and strengthening HEP    Status  Partially Met      PT LONG TERM GOAL #2   Title  Pt will improve FGA to 30/30    Status  Partially Met      PT LONG TERM GOAL #4   Title  Pt will demonstrate ability to safely negotiate a ladder    Status  On-going            Plan - 04/25/19 1100    Clinical Impression Statement  Pt with some fatigue with a progressed therapy session. Pt posture was monitored closely with the bottom loaded squats. Pt also did well with the single leg strengthening, RLE id weaker than the, but the L lack good endurance noted with leg extensions and curls. OHP with machine is taxing on pt.    Comorbidities  tonsil cancer with radiation and chemotherapy in 2005, cardiomyopathy, atrial flutter, hypothyroidism, diverticulitis with abcess, CAD, and C5 laminectomy    Examination-Activity Limitations  Carry;Lift;Reach Overhead;Squat    Examination-Participation Restrictions  Driving;Other    Stability/Clinical Decision Making  Evolving/Moderate complexity    Rehab Potential   Good    PT Frequency  1x / week    PT Duration  6 weeks    PT Treatment/Interventions  ADLs/Self Care Home Management;Gait training;Stair training;Functional mobility training;Therapeutic activities;Therapeutic exercise;Balance training;Neuromuscular re-education;Patient/family education;Other (comment)    PT Next Visit Plan  continue to see him 2x a week to prepare him for his job.       Patient will benefit from skilled therapeutic intervention in order to improve the following deficits and impairments:  Decreased  balance, Decreased endurance, Difficulty walking, Decreased strength  Visit Diagnosis: Other lack of coordination  Other symptoms and signs involving cognitive functions following cerebral infarction  Muscle weakness (generalized)     Problem List Patient Active Problem List   Diagnosis Date Noted  . Hypokalemia 03/12/2019  . Expressive aphasia 03/10/2019  . Dysphagia 03/10/2019  . Middle cerebral artery embolism, left 03/09/2019  . Diverticular stricture ([place]) 03/07/2019  . Bacteria in urine   . Chronic atrial fibrillation 12/02/2018  . Current use of long term anticoagulation 12/02/2018  . UTI (urinary tract infection) 11/29/2018  . Hyponatremia 11/29/2018  . PAF (paroxysmal atrial fibrillation) ([place]) 11/29/2018  . Diverticulitis of large intestine with abscess 09/23/2018  . Dyslipidemia 12/05/2017  . CAD (coronary artery disease), native coronary artery 12/06/2015  . SOB (shortness of breath)   . NICM (nonischemic cardiomyopathy) ([place])   . Thyroid activity decreased   . Hypothyroidism 08/12/2013  . History of radiation therapy 05/30/2012  . Smokes tobacco daily 05/30/2012  . Cancer of tonsillar fossa ([place]) 11/27/2011  . Tonsil cancer ([place]) 2005  . History of chemotherapy 2005    Scot Jun, PTA 04/25/2019, 11:05 AM  [place], [place], [address] Phone:  [phone]   Fax:  [phone]  Name: Adrian [name] MRN: [id number] Date of Birth: [birth year]

## 2019-04-28 ENCOUNTER — Ambulatory Visit: Payer: BC Managed Care – PPO | Admitting: Physical Therapy

## 2019-04-28 ENCOUNTER — Other Ambulatory Visit: Payer: Self-pay

## 2019-04-28 ENCOUNTER — Encounter: Payer: Self-pay | Admitting: Physical Therapy

## 2019-04-28 ENCOUNTER — Inpatient Hospital Stay: Payer: Self-pay | Admitting: Neurology

## 2019-04-28 DIAGNOSIS — I69318 Other symptoms and signs involving cognitive functions following cerebral infarction: Secondary | ICD-10-CM

## 2019-04-28 DIAGNOSIS — R278 Other lack of coordination: Secondary | ICD-10-CM

## 2019-04-28 DIAGNOSIS — M6281 Muscle weakness (generalized): Secondary | ICD-10-CM

## 2019-04-28 DIAGNOSIS — R1312 Dysphagia, oropharyngeal phase: Secondary | ICD-10-CM | POA: Diagnosis not present

## 2019-04-28 NOTE — Therapy (Signed)
[place], [place], [address] Phone: [phone]   Fax:  [phone]  Physical Therapy Treatment  Patient Details  Name: Adrian [name] MRN: [id number] Date of Birth: 05-24-1956 Referring Provider (PT): [name], MD (PCP) referred to therapy by NP at hospital   Encounter Date: 04/28/2019  PT End of Session - 04/28/19 1054    Visit Number  7    Number of Visits  7    Date for PT Re-Evaluation  05/11/19    Authorization Type  BCBS    PT Start Time  1015    PT Stop Time  1055    PT Time Calculation (min)  40 min    Activity Tolerance  Patient tolerated treatment well    Behavior During Therapy  [facility] for tasks assessed/performed       Past Medical History:  Diagnosis Date  . AF (paroxysmal atrial fibrillation) ([place]) 11/29/2018  . Atrial flutter ([place]) 10/12/2015  . CAD (coronary artery disease), native coronary artery 12/06/2015   a. 10/2015 MV: EF  37%, reversible defect inferior apex, intermediate risk findings; b. 10/2015 Cath: 20% mid RCA.  . Colonic diverticular abscess   . Diverticulitis   . History of chemotherapy 2005   Cisplatin  . Hypothyroidism   . NICM (nonischemic cardiomyopathy) ([place]) 10/14/2015   a. Tachy mediated?;  b. Echo 3/17 - Mild concentric LVH, EF 30-35%, anteroseptal, anterior, anterolateral, apical anterior, lateral hypokinesis, trivial MR, mild to moderately reduced RVSF; c. LHC 3/17 - mRCA 20%  . Paroxysmal atrial flutter ([place])    a. TEE 3/17 with ? LAA clot-->s/p TEE/DCCV 11/24/2015  . Radiation NOv.3,2005-Dec. 15, 2005   6810 cGy in 30 fractions  . Tonsil cancer [facility]) 2005   Dr Romeo Rabon.  XRT    Past Surgical History:  Procedure Laterality Date  . APPENDECTOMY N/A 03/07/2019   Procedure: ROBOTIC ASSISTED APPENDECTOMY;  Surgeon: Michael Boston, MD;  [facility];  Service: General;  Laterality: N/A;  . CARDIAC CATHETERIZATION N/A 10/18/2015   Procedure: Left  Heart Cath and Coronary Angiography;  Surgeon: Burnell Blanks, MD;  Location: [place] [facility];  Service: Cardiovascular;  Laterality: N/A;  . CARDIOVERSION N/A 11/24/2015   Procedure: CARDIOVERSION;  Surgeon: Josue Hector, MD;  Location: [place];  Service: Cardiovascular;  Laterality: N/A;  . CYSTOSCOPY WITH STENT PLACEMENT Bilateral 03/07/2019   Procedure: CYSTOSCOPY WITH BILATERAL FIREFLY INJECTION;  Surgeon: Ardis Hughs, MD;  [facility];  Service: Urology;  Laterality: Bilateral;  . GASTROSTOMY TUBE PLACEMENT  07/04/2004   IR - G tube for tonsilar cancer  . IR ANGIO INTRA EXTRACRAN SEL COM CAROTID INNOMINATE UNI R MOD SED  03/09/2019  . IR ANGIO VERTEBRAL SEL VERTEBRAL UNI R MOD SED  03/09/2019  . IR CT HEAD LTD  03/09/2019  . IR PERCUTANEOUS ART THROMBECTOMY/INFUSION INTRACRANIAL INC DIAG ANGIO  03/09/2019  . IR RADIOLOGIST EVAL & MGMT  12/18/2018  . LAMINECTOMY     C5/placement of steel plate  . NECK SURGERY  2003   replaced disk  . RADIOLOGY WITH ANESTHESIA N/A 03/08/2019   Procedure: IR WITH ANESTHESIA;  Surgeon: Luanne Bras, MD;  Location: [place];  Service: Radiology;  Laterality: N/A;  . TEE WITHOUT CARDIOVERSION N/A 10/15/2015   Procedure: TRANSESOPHAGEAL ECHOCARDIOGRAM (TEE);  Surgeon: Larey Dresser, MD;  Location: [place];  Service: Cardiovascular;  Laterality: N/A;  . TEE WITHOUT CARDIOVERSION N/A 11/24/2015  Procedure: TRANSESOPHAGEAL ECHOCARDIOGRAM (TEE);  Surgeon: Josue Hector, MD;  Location: [place];  Service: Cardiovascular;  Laterality: N/A;  . XI ROBOTIC ASSISTED COLOSTOMY TAKEDOWN N/A 03/07/2019   Procedure: XI ROBOTIC ASSISTED LOW ANTERIOR RESECTION, RIGID PROCTOSCOPY;  Surgeon: Michael Boston, MD;  [facility];  Service: General;  Laterality: N/A;    There were no vitals filed for this visit.  Subjective Assessment - 04/28/19 1020    Subjective  "Pretty good"    Patient Stated Goals  Wants to get back to work at [employer]     Currently in Pain?  No/denies                       [facility] Adult PT Treatment/Exercise - 04/28/19 0001      Knee/Hip Exercises: Aerobic   Elliptical  I8 R 5 x4 min     Nustep  level 6 x 4 minutes    Other Aerobic  SA overhead 7lb carry, bilat overhead carry       Knee/Hip Exercises: Machines for Strengthening   Cybex Leg Press  50# 2x15, single legs 30# 2x0    Other Machine  35# seated row, lats pulls 2x10, 20lb chest press 2x10      Knee/Hip Exercises: Standing   Other Standing Knee Exercises  Functional box lifts 72lb 2x5     Other Standing Knee Exercises  Bottom loaded squat 42.5lb 3x10                   PT Long Term Goals - 04/28/19 1046      PT LONG TERM GOAL #1   Title  Pt will demonstrate independence with balance and strengthening HEP    Status  Achieved      PT LONG TERM GOAL #4   Title  Pt will demonstrate ability to safely negotiate a ladder    Status  On-going      PT LONG TERM GOAL #5   Title  Pt will demonstrate ability to squat, lift and carry containers containing up to 50 lbs    Status  Partially Met            Plan - 04/28/19 1055    Clinical Impression Statement  Pt was able to complete all of today's interventions without issue. Some R shoulder weakness noted with overhead farmers carry. Single leg interventions also gives pt some difficulty due to fatigue and decrease muscular endurance. Pt was able to progress to 70lb box lifts with good form and posture.    Comorbidities  tonsil cancer with radiation and chemotherapy in 2005, cardiomyopathy, atrial flutter, hypothyroidism, diverticulitis with abcess, CAD, and C5 laminectomy    Examination-Activity Limitations  Carry;Lift;Reach Overhead;Squat    Examination-Participation Restrictions  Driving;Other    Stability/Clinical Decision Making  Evolving/Moderate complexity    Rehab Potential  Good    PT Frequency  1x / week    PT Duration  6 weeks    PT Treatment/Interventions   ADLs/Self Care Home Management;Gait training;Stair training;Functional mobility training;Therapeutic activities;Therapeutic exercise;Balance training;Neuromuscular re-education;Patient/family education;Other (comment)    PT Next Visit Plan  continue to see him 2x a week to prepare him for his job.       Patient will benefit from skilled therapeutic intervention in order to improve the following deficits and impairments:  Decreased balance, Decreased endurance, Difficulty walking, Decreased strength  Visit Diagnosis: Muscle weakness (generalized)  Other symptoms and signs involving cognitive functions following cerebral infarction  Other  lack of coordination     Problem List Patient Active Problem List   Diagnosis Date Noted  . Hypokalemia 03/12/2019  . Expressive aphasia 03/10/2019  . Dysphagia 03/10/2019  . Middle cerebral artery embolism, left 03/09/2019  . Diverticular stricture ([place]) 03/07/2019  . Bacteria in urine   . Chronic atrial fibrillation 12/02/2018  . Current use of long term anticoagulation 12/02/2018  . UTI (urinary tract infection) 11/29/2018  . Hyponatremia 11/29/2018  . PAF (paroxysmal atrial fibrillation) ([place]) 11/29/2018  . Diverticulitis of large intestine with abscess 09/23/2018  . Dyslipidemia 12/05/2017  . CAD (coronary artery disease), native coronary artery 12/06/2015  . SOB (shortness of breath)   . NICM (nonischemic cardiomyopathy) ([place])   . Thyroid activity decreased   . Hypothyroidism 08/12/2013  . History of radiation therapy 05/30/2012  . Smokes tobacco daily 05/30/2012  . Cancer of tonsillar fossa ([place]) 11/27/2011  . Tonsil cancer ([place]) 2005  . History of chemotherapy 2005    Scot Jun, PTA 04/28/2019, 10:58 AM  [place], [place], [address] Phone: [phone]   Fax:  [phone]  Name: Adrian [name] MRN: [id number] Date of Birth:  1956/05/13

## 2019-04-30 ENCOUNTER — Other Ambulatory Visit: Payer: Self-pay

## 2019-04-30 ENCOUNTER — Ambulatory Visit: Payer: BC Managed Care – PPO | Admitting: Physical Therapy

## 2019-04-30 ENCOUNTER — Telehealth: Payer: Self-pay | Admitting: Adult Health

## 2019-04-30 ENCOUNTER — Encounter: Payer: Self-pay | Admitting: Physical Therapy

## 2019-04-30 ENCOUNTER — Telehealth: Payer: Self-pay | Admitting: Cardiology

## 2019-04-30 DIAGNOSIS — M6281 Muscle weakness (generalized): Secondary | ICD-10-CM

## 2019-04-30 DIAGNOSIS — R1312 Dysphagia, oropharyngeal phase: Secondary | ICD-10-CM | POA: Diagnosis not present

## 2019-04-30 NOTE — Telephone Encounter (Signed)
[facility] called and wanted to know if we could fax results from recent tests done by our office to their office. They do not use Epic, and are unable to see results of tests that we do, Anything would be appreciated.  The files can be faxed to the office at [phone]. The patient has an appointment tomorrow morning, so if the files could be sent today they would appreciate it

## 2019-04-30 NOTE — Telephone Encounter (Signed)
Adrian [name] with [facility] ophthalmology call requesting Dr. Clydene Fake recent office note.  The patient has an appointment there tomorrow.  Fax number is [phone].  [facility] ophthalmology number is [phone]

## 2019-04-30 NOTE — Therapy (Signed)
[place], [place], [address] Phone: [phone]   Fax:  [phone]  Physical Therapy Treatment  Patient Details  Name: Adrian [name] MRN: [id number] Date of Birth: 01-09-1956 Referring Provider (PT): [name], MD (PCP) referred to therapy by NP at hospital   Encounter Date: 04/30/2019  PT End of Session - 04/30/19 1053    Visit Number  8    Date for PT Re-Evaluation  05/11/19    PT Start Time  1013    PT Stop Time  1055    PT Time Calculation (min)  42 min    Activity Tolerance  Patient tolerated treatment well    Behavior During Therapy  [facility] for tasks assessed/performed       Past Medical History:  Diagnosis Date  . AF (paroxysmal atrial fibrillation) ([place]) 11/29/2018  . Atrial flutter ([place]) 10/12/2015  . CAD (coronary artery disease), native coronary artery 12/06/2015   a. 10/2015 MV: EF  37%, reversible defect inferior apex, intermediate risk findings; b. 10/2015 Cath: 20% mid RCA.  . Colonic diverticular abscess   . Diverticulitis   . History of chemotherapy 2005   Cisplatin  . Hypothyroidism   . NICM (nonischemic cardiomyopathy) ([place]) 10/14/2015   a. Tachy mediated?;  b. Echo 3/17 - Mild concentric LVH, EF 30-35%, anteroseptal, anterior, anterolateral, apical anterior, lateral hypokinesis, trivial MR, mild to moderately reduced RVSF; c. LHC 3/17 - mRCA 20%  . Paroxysmal atrial flutter ([place])    a. TEE 3/17 with ? LAA clot-->s/p TEE/DCCV 11/24/2015  . Radiation NOv.3,2005-Dec. 15, 2005   6810 cGy in 30 fractions  . Tonsil cancer [facility]) 2005   Dr Romeo Rabon.  XRT    Past Surgical History:  Procedure Laterality Date  . APPENDECTOMY N/A 03/07/2019   Procedure: ROBOTIC ASSISTED APPENDECTOMY;  Surgeon:  Boston, MD;  [facility];  Service: General;  Laterality: N/A;  . CARDIAC CATHETERIZATION N/A 10/18/2015   Procedure: Left Heart Cath and Coronary Angiography;  Surgeon:  Burnell Blanks, MD;  Location: [place] [facility];  Service: Cardiovascular;  Laterality: N/A;  . CARDIOVERSION N/A 11/24/2015   Procedure: CARDIOVERSION;  Surgeon: Josue Hector, MD;  Location: [place];  Service: Cardiovascular;  Laterality: N/A;  . CYSTOSCOPY WITH STENT PLACEMENT Bilateral 03/07/2019   Procedure: CYSTOSCOPY WITH BILATERAL FIREFLY INJECTION;  Surgeon: Ardis Hughs, MD;  [facility];  Service: Urology;  Laterality: Bilateral;  . GASTROSTOMY TUBE PLACEMENT  07/04/2004   IR - G tube for tonsilar cancer  . IR ANGIO INTRA EXTRACRAN SEL COM CAROTID INNOMINATE UNI R MOD SED  03/09/2019  . IR ANGIO VERTEBRAL SEL VERTEBRAL UNI R MOD SED  03/09/2019  . IR CT HEAD LTD  03/09/2019  . IR PERCUTANEOUS ART THROMBECTOMY/INFUSION INTRACRANIAL INC DIAG ANGIO  03/09/2019  . IR RADIOLOGIST EVAL & MGMT  12/18/2018  . LAMINECTOMY     C5/placement of steel plate  . NECK SURGERY  2003   replaced disk  . RADIOLOGY WITH ANESTHESIA N/A 03/08/2019   Procedure: IR WITH ANESTHESIA;  Surgeon: Luanne Bras, MD;  Location: [place];  Service: Radiology;  Laterality: N/A;  . TEE WITHOUT CARDIOVERSION N/A 10/15/2015   Procedure: TRANSESOPHAGEAL ECHOCARDIOGRAM (TEE);  Surgeon: Larey Dresser, MD;  Location: [place];  Service: Cardiovascular;  Laterality: N/A;  . TEE WITHOUT CARDIOVERSION N/A 11/24/2015   Procedure: TRANSESOPHAGEAL ECHOCARDIOGRAM (TEE);  Surgeon: Josue Hector, MD;  Location: [place];  Service: Cardiovascular;  Laterality: N/A;  . XI ROBOTIC ASSISTED COLOSTOMY TAKEDOWN N/A 03/07/2019   Procedure: XI ROBOTIC ASSISTED LOW ANTERIOR RESECTION, RIGID PROCTOSCOPY;  Surgeon:  Boston, MD;  [facility];  Service: General;  Laterality: N/A;    There were no vitals filed for this visit.  Subjective Assessment - 04/30/19 1014    Subjective  Patient reports that he is unsure if he could do his job at this point, but he is feeling stronger and better    Currently in  Pain?  No/denies                       [facility] Adult PT Treatment/Exercise - 04/30/19 0001      Knee/Hip Exercises: Aerobic   Nustep  level 6 x 4 minutes    Other Aerobic  SA overhead 7lb carry, bilat overhead carry , ladder climbs x 3      Knee/Hip Exercises: Machines for Strengthening   Cybex Knee Extension  15# 3x10    Cybex Knee Flexion  35# 3x10    Other Machine  35# and 45# seated row, lats pulls 2x10, 20lb and 25# chest press 2x10, 110# assisted pull ups      Knee/Hip Exercises: Standing   Other Standing Knee Exercises  20# single arm farmers carry                  PT Long Term Goals - 04/30/19 1055      PT LONG TERM GOAL #1   Title  Pt will demonstrate independence with balance and strengthening HEP    Status  Achieved      PT LONG TERM GOAL #3   Title  Pt will improve gait velocity to >/= 4.0 ft/sec    Status  Achieved      PT LONG TERM GOAL #4   Title  Pt will demonstrate ability to safely negotiate a ladder    Status  Achieved            Plan - 04/30/19 1054    Clinical Impression Statement  Patient giving good effort and feeling stronger, he seemed to have more difficulty finding words today he does say he is continuing with speech at this time to help with this.  He feels like work is going to be difficult but thinks that over the next few weeks with [place] pushing him he will be ready    PT Treatment/Interventions  ADLs/Self Care Home Management;Gait training;Stair training;Functional mobility training;Therapeutic activities;Therapeutic exercise;Balance training;Neuromuscular re-education;Patient/family education;Other (comment)    PT Next Visit Plan  continue to see him 2x a week to prepare him for his job.    Consulted and Agree with Plan of Care  Patient       Patient will benefit from skilled therapeutic intervention in order to improve the following deficits and impairments:  Decreased balance, Decreased endurance, Difficulty  walking, Decreased strength  Visit Diagnosis: Muscle weakness (generalized)     Problem List Patient Active Problem List   Diagnosis Date Noted  . Hypokalemia 03/12/2019  . Expressive aphasia 03/10/2019  . Dysphagia 03/10/2019  . Middle cerebral artery embolism, left 03/09/2019  . Diverticular stricture ([place]) 03/07/2019  . Bacteria in urine   . Chronic atrial fibrillation 12/02/2018  . Current use of long term anticoagulation 12/02/2018  . UTI (urinary tract infection) 11/29/2018  . Hyponatremia 11/29/2018  . PAF (paroxysmal atrial fibrillation) ([place]) 11/29/2018  . Diverticulitis of large intestine with abscess 09/23/2018  .  Dyslipidemia 12/05/2017  . CAD (coronary artery disease), native coronary artery 12/06/2015  . SOB (shortness of breath)   . NICM (nonischemic cardiomyopathy) ([place])   . Thyroid activity decreased   . Hypothyroidism 08/12/2013  . History of radiation therapy 05/30/2012  . Smokes tobacco daily 05/30/2012  . Cancer of tonsillar fossa ([place]) 11/27/2011  . Tonsil cancer ([place]) 2005  . History of chemotherapy 2005    , W., PT 04/30/2019, 11:02 AM  [place] [address] [place], [place], [address] Phone: [phone]   Fax:  [phone]  Name: Adrian [name] MRN: [id number] Date of Birth: 1956-06-20

## 2019-05-01 ENCOUNTER — Ambulatory Visit: Payer: BC Managed Care – PPO | Admitting: Occupational Therapy

## 2019-05-01 ENCOUNTER — Telehealth: Payer: Self-pay | Admitting: Speech Pathology

## 2019-05-01 ENCOUNTER — Other Ambulatory Visit: Payer: Self-pay

## 2019-05-01 ENCOUNTER — Ambulatory Visit: Payer: BC Managed Care – PPO | Admitting: Speech Pathology

## 2019-05-01 ENCOUNTER — Telehealth: Payer: Self-pay | Admitting: Neurology

## 2019-05-01 DIAGNOSIS — R1312 Dysphagia, oropharyngeal phase: Secondary | ICD-10-CM | POA: Diagnosis not present

## 2019-05-01 DIAGNOSIS — R4701 Aphasia: Secondary | ICD-10-CM

## 2019-05-01 DIAGNOSIS — M6281 Muscle weakness (generalized): Secondary | ICD-10-CM

## 2019-05-01 DIAGNOSIS — R41841 Cognitive communication deficit: Secondary | ICD-10-CM

## 2019-05-01 DIAGNOSIS — R29818 Other symptoms and signs involving the nervous system: Secondary | ICD-10-CM

## 2019-05-01 NOTE — Telephone Encounter (Signed)
I receive the release form this am and already fax over the office notes per the eye doctors request. Release form sent to medical records for scanning.

## 2019-05-01 NOTE — Therapy (Signed)
[facility] [address] [place], [place], [address] Phone: [phone]   Fax:  [phone]  Speech Language Pathology Treatment  Patient Details  Name: Adrian [name] MRN: [id number] Date of Birth: 1955-10-22 Referring Provider (SLP): [name], MD (PCP) referred to therapy by NP at hospital   Encounter Date: 05/01/2019  End of Session - 05/01/19 1517    Visit Number  9    Number of Visits  17    Date for SLP Re-Evaluation  05/24/19    Authorization Type  BCBS    SLP Start Time  1315    SLP Stop Time   1400    SLP Time Calculation (min)  45 min    Activity Tolerance  Patient tolerated treatment well       Past Medical History:  Diagnosis Date  . AF (paroxysmal atrial fibrillation) ([place]) 11/29/2018  . Atrial flutter ([place]) 10/12/2015  . CAD (coronary artery disease), native coronary artery 12/06/2015   a. 10/2015 MV: EF  37%, reversible defect inferior apex, intermediate risk findings; b. 10/2015 Cath: 20% mid RCA.  . Colonic diverticular abscess   . Diverticulitis   . History of chemotherapy 2005   Cisplatin  . Hypothyroidism   . NICM (nonischemic cardiomyopathy) ([place]) 10/14/2015   a. Tachy mediated?;  b. Echo 3/17 - Mild concentric LVH, EF 30-35%, anteroseptal, anterior, anterolateral, apical anterior, lateral hypokinesis, trivial MR, mild to moderately reduced RVSF; c. LHC 3/17 - mRCA 20%  . Paroxysmal atrial flutter ([place])    a. TEE 3/17 with ? LAA clot-->s/p TEE/DCCV 11/24/2015  . Radiation NOv.3,2005-Dec. 15, 2005   6810 cGy in 30 fractions  . Tonsil cancer [facility]) 2005   Dr Romeo Rabon.  XRT    Past Surgical History:  Procedure Laterality Date  . APPENDECTOMY N/A 03/07/2019   Procedure: ROBOTIC ASSISTED APPENDECTOMY;  Surgeon: Michael Boston, MD;  [facility]: [facility];  Service: General;  Laterality: N/A;  . CARDIAC CATHETERIZATION N/A 10/18/2015   Procedure: Left Heart Cath and Coronary Angiography;  Surgeon:  Burnell Blanks, MD;  Location: [facility];  Service: Cardiovascular;  Laterality: N/A;  . CARDIOVERSION N/A 11/24/2015   Procedure: CARDIOVERSION;  Surgeon: Josue Hector, MD;  Location: [facility];  Service: Cardiovascular;  Laterality: N/A;  . CYSTOSCOPY WITH STENT PLACEMENT Bilateral 03/07/2019   Procedure: CYSTOSCOPY WITH BILATERAL FIREFLY INJECTION;  Surgeon: Ardis Hughs, MD;  [facility];  Service: Urology;  Laterality: Bilateral;  . GASTROSTOMY TUBE PLACEMENT  07/04/2004   IR - G tube for tonsilar cancer  . IR ANGIO INTRA EXTRACRAN SEL COM CAROTID INNOMINATE UNI R MOD SED  03/09/2019  . IR ANGIO VERTEBRAL SEL VERTEBRAL UNI R MOD SED  03/09/2019  . IR CT HEAD LTD  03/09/2019  . IR PERCUTANEOUS ART THROMBECTOMY/INFUSION INTRACRANIAL INC DIAG ANGIO  03/09/2019  . IR RADIOLOGIST EVAL & MGMT  12/18/2018  . LAMINECTOMY     C5/placement of steel plate  . NECK SURGERY  2003   replaced disk  . RADIOLOGY WITH ANESTHESIA N/A 03/08/2019   Procedure: IR WITH ANESTHESIA;  Surgeon: Luanne Bras, MD;  Location: [facility];  Service: Radiology;  Laterality: N/A;  . TEE WITHOUT CARDIOVERSION N/A 10/15/2015   Procedure: TRANSESOPHAGEAL ECHOCARDIOGRAM (TEE);  Surgeon: Larey Dresser, MD;  Location: [facility];  Service: Cardiovascular;  Laterality: N/A;  . TEE WITHOUT CARDIOVERSION N/A 11/24/2015   Procedure: TRANSESOPHAGEAL ECHOCARDIOGRAM (TEE);  Surgeon: Josue Hector, MD;  [facility];  Service: Cardiovascular;  Laterality: N/A;  . XI ROBOTIC ASSISTED COLOSTOMY TAKEDOWN N/A 03/07/2019   Procedure: XI ROBOTIC ASSISTED LOW ANTERIOR RESECTION, RIGID PROCTOSCOPY;  Surgeon: Michael Boston, MD;  [facility];  Service: General;  Laterality: N/A;    There were no vitals filed for this visit.  Subjective Assessment - 05/01/19 1320    Subjective  "Monkey... [id number], what? And some of it was hard."    Currently in Pain?  No/denies            ADULT SLP  TREATMENT - 05/01/19 1457      General Information   Behavior/Cognition  Alert;Cooperative;Pleasant mood      Treatment Provided   Treatment provided  Cognitive-Linquistic      Dysphagia Treatment   Temperature Spikes Noted  No    Respiratory Status  Room air    Oral Cavity - Dentition  Adequate natural dentition    Treatment Methods  Compensation strategy training;Patient/caregiver education    Other treatment/comments  Pt reports he is eating/drinking "pretty much anything," states he is not using head turn most of the time. SLP educated pt re: rationale for modifications and role of instrumental study in determining safety for diet advancement. Pt wishes to forgo repeat MBS at this time. Verbalizes understanding of aspiration risks; he elects to continue with thin liquids at this time. Has been completing dysphagia HEP "3-4" x per week. SLP reinforced need to use throat clear, effortful swallow, questioned pt re: signs of aspiration PNA; pt was able to name 4 sx.       Pain Assessment   Pain Assessment  No/denies pain      Cognitive-Linquistic Treatment   Treatment focused on  Aphasia    Skilled Treatment  Prior to session, pt having difficulty requesting reprint of his schedule from administrative staff due to anomia/dysnomia. Staff thought pt was requesting MyChart activation. SLP used rephrasing and question cues to assist pt with communicating his request to office. Pt told SLP he felt his speech had been "going down" the last 3 days. SLP relayed information re: phone call from pt's wife (separated) who also conveyed this. Pt did appear to have more difficulty in simple conversation today, with more hesitations, dysfluencies. Pt denies worsening of other symptoms. Reports he has not been sleeping well, and is feeling exhausted from visiting family (daughter and twin grandchildren). Verbal and written education provided re: signs of stroke; advised that pt should contact neurologist for any  of these, including worsening speech. SLP also educated how fatigue may impact his wordfinding/communication abilities. Pt showed SLP his homework (word-level writing), reviewed this with pt intermittently aware of errors. Mod written cues to correct paraphasias/spelling errors.       Assessment / Recommendations / Plan   Plan  Continue with current plan of care;Goals updated      Progression Toward Goals   Progression toward goals  Progressing toward goals       SLP Education - 05/01/19 1517    Education Details  BE FAST, stroke warning signs       SLP Short Term Goals - 05/01/19 1353      SLP SHORT TERM GOAL #1   Title  Pt will demo HEP for dysphagia with rare min A x 3 sessions.    Time  1    Period  Weeks    Status  Partially Met      SLP SHORT TERM GOAL #2   Title  Pt  will complete further assessment of cognition and writing and goals added as appropriate, within first 4 visits.    Status  Achieved      SLP SHORT TERM GOAL #3   Title  Pt will utitlize multimodal communication/compensations for anomia in 8 minutes simple-mod complex conversation over 3 sessions with occasional min A    Baseline  04/15/19 04/17/19 04/24/19    Time  1    Period  Weeks    Status  Achieved      SLP SHORT TERM GOAL #4   Title  Pt will alternate attention between 2 functional tasks for 12 minutes >90% accuracy x 2 sessions with compensations allowed.    Baseline  04/17/19    Time  4    Period  Weeks    Status  Partially Met       SLP Long Term Goals - 05/01/19 1354      SLP LONG TERM GOAL #1   Title  Pt will complete HEP for dysphagia with modified independence x 3 sessions.    Time  4    Period  Weeks    Status  On-going      SLP LONG TERM GOAL #2   Title  Pt will participate in repeat MBS for objective assessment of swallow function to determine readiness for diet advancement.    Time  5    Period  Weeks    Status  Deferred   on hold as pt is electing to take thin liquids at this  time; will continue to monitor clinically     SLP LONG TERM GOAL #3   Title  Pt will utitlize multimodal communication/compensations for anomia in 12 minutes mod complex conversation over 3 sessions with occasional min A    Time  4    Period  Weeks    Status  On-going      SLP LONG TERM GOAL #4   Title  Pt will demo functional divided attention for 15 minutes x2 sessions, >95% accuracy on tasks with double checking allowed, x2 sessions.    Time  4    Period  Weeks    Status  New      SLP LONG TERM GOAL #5   Title  Patient will compose written text messages and/or brief email responses using compensations (speech-to-text).    Time  4    Period  Weeks    Status  On-going       Plan - 05/01/19 1518    Clinical Impression Statement  Mr. Eshbach cont to present with mild anomic aphasia, mild dysarthria and oropharyngeal dysphagia. Pt reports worsening of his communication abilities over the past 3 days. He did appear to have more frequent word-finding difficulty in conversation with SLP today compared with previous session. Pt was advised to contact MD. Pt was upgraded to "runny nectar" previously by SLP. He is electing to take thin liquids at this time. SLP reinforced need to use throat clear, effortful swallow, and s/sx of aspiration PNA. I recommend cont'd skilled ST to address communication and swallowing impairments, in order to maximize pt's independence and safety, increase his ability to communicate wants and needs, and improve quality of life.    Speech Therapy Frequency  2x / week    Duration  --   8 weeks/17 visits   Treatment/Interventions  Aspiration precaution training;Diet toleration management by SLP;Environmental controls;Trials of upgraded texture/liquids;Cognitive reorganization;Oral motor exercises;Multimodal communcation approach;Compensatory strategies;Pharyngeal strengthening exercises;Language facilitation;Compensatory techniques;Cueing hierarchy;Internal/external  aids;Functional tasks;SLP  instruction and feedback;Patient/family education    Potential to Achieve Goals  Good    Consulted and Agree with Plan of Care  Patient       Patient will benefit from skilled therapeutic intervention in order to improve the following deficits and impairments:   Aphasia  Dysphagia, oropharyngeal phase  Cognitive communication deficit    Problem List Patient Active Problem List   Diagnosis Date Noted  . Hypokalemia 03/12/2019  . Expressive aphasia 03/10/2019  . Dysphagia 03/10/2019  . Middle cerebral artery embolism, left 03/09/2019  . Diverticular stricture ([place]) 03/07/2019  . Bacteria in urine   . Chronic atrial fibrillation 12/02/2018  . Current use of long term anticoagulation 12/02/2018  . UTI (urinary tract infection) 11/29/2018  . Hyponatremia 11/29/2018  . PAF (paroxysmal atrial fibrillation) ([place]) 11/29/2018  . Diverticulitis of large intestine with abscess 09/23/2018  . Dyslipidemia 12/05/2017  . CAD (coronary artery disease), native coronary artery 12/06/2015  . SOB (shortness of breath)   . NICM (nonischemic cardiomyopathy) ([place])   . Thyroid activity decreased   . Hypothyroidism 08/12/2013  . History of radiation therapy 05/30/2012  . Smokes tobacco daily 05/30/2012  . Cancer of tonsillar fossa ([place]) 11/27/2011  . Tonsil cancer ([place]) 2005  . History of chemotherapy 2005   Deneise Lever, [place], [place] Speech-Language Pathologist  Aliene Altes 05/01/2019, 3:20 PM  [facility] [address] [place], [place], [address] Phone: [phone]   Fax:  [phone]   Name: RUFINO STAUP MRN: [id number] Date of Birth: [birth year]

## 2019-05-01 NOTE — Telephone Encounter (Signed)
I called [facility] ophthalmology and ask for Romie Minus. The staff stated Romie Minus was not in the office today. I stated the pts PCP referred pt to their office. I stated we need a release form from [facility] ophthalmology for records to be sent to their office. I gave fax number of [phone] for release form.

## 2019-05-01 NOTE — Telephone Encounter (Signed)
Adrian [name]. Patient's wife called in, they're at an ophthalmologist appointment right now and the doctor is needing our records. They are sending a release to [place] now via fax, but would like those sent over quickly. She also wanted to discuss his disability forms and requested a call bk at [phone], or patient can be reached at [phone]

## 2019-05-01 NOTE — Telephone Encounter (Signed)
Pt's ex-wife called and left message with SLP concerning what she feels is a regression in pt's speech. SLP returned phone call and spoke with [place]. She stated she and her daughter have noted pt with more difficulty with speech over the past 3 days: "a marked regression, almost to the point when he was in the hospital." Deny worsening of other symptoms but that pt "looks tired." SLP advised her to call pt's neurologist. Pt has not been seen by ST since 04/24/19. SLP to place return call to pt's ex-wife after pt's appointment at 1:15 today if SLP notes regression in pt's speech as well.  Deneise Lever, MS, Actor

## 2019-05-01 NOTE — Therapy (Signed)
[facility] [address] [place], [place], [address] Phone: [phone]   Fax:  [phone]  Occupational Therapy Treatment  Patient Details  Name: Adrian [name] MRN: [id number] Date of Birth: Dec 13, 1955 Referring Provider (OT): Carolann Littler   Encounter Date: 05/01/2019  OT End of Session - 05/01/19 1311    Visit Number  7    Number of Visits  9    Authorization Type  BC/BS    OT Start Time  1230    OT Stop Time  1315    OT Time Calculation (min)  45 min    Activity Tolerance  Patient tolerated treatment well    Behavior During Therapy  [facility] for tasks assessed/performed       Past Medical History:  Diagnosis Date  . AF (paroxysmal atrial fibrillation) ([place]) 11/29/2018  . Atrial flutter ([place]) 10/12/2015  . CAD (coronary artery disease), native coronary artery 12/06/2015   a. 10/2015 MV: EF  37%, reversible defect inferior apex, intermediate risk findings; b. 10/2015 Cath: 20% mid RCA.  . Colonic diverticular abscess   . Diverticulitis   . History of chemotherapy 2005   Cisplatin  . Hypothyroidism   . NICM (nonischemic cardiomyopathy) ([place]) 10/14/2015   a. Tachy mediated?;  b. Echo 3/17 - Mild concentric LVH, EF 30-35%, anteroseptal, anterior, anterolateral, apical anterior, lateral hypokinesis, trivial MR, mild to moderately reduced RVSF; c. LHC 3/17 - mRCA 20%  . Paroxysmal atrial flutter ([place])    a. TEE 3/17 with ? LAA clot-->s/p TEE/DCCV 11/24/2015  . Radiation NOv.3,2005-Dec. 15, 2005   6810 cGy in 30 fractions  . Tonsil cancer [facility]) 2005   Dr Romeo Rabon.  XRT    Past Surgical History:  Procedure Laterality Date  . APPENDECTOMY N/A 03/07/2019   Procedure: ROBOTIC ASSISTED APPENDECTOMY;  Surgeon: Michael Boston, MD;  [facility];  Service: General;  Laterality: N/A;  . CARDIAC CATHETERIZATION N/A 10/18/2015   Procedure: Left Heart Cath and Coronary Angiography;  Surgeon: Burnell Blanks, MD;  Location:  [facility];  Service: Cardiovascular;  Laterality: N/A;  . CARDIOVERSION N/A 11/24/2015   Procedure: CARDIOVERSION;  Surgeon: Josue Hector, MD;  Location: [facility];  Service: Cardiovascular;  Laterality: N/A;  . CYSTOSCOPY WITH STENT PLACEMENT Bilateral 03/07/2019   Procedure: CYSTOSCOPY WITH BILATERAL FIREFLY INJECTION;  Surgeon: Ardis Hughs, MD;  [facility];  Service: Urology;  Laterality: Bilateral;  . GASTROSTOMY TUBE PLACEMENT  07/04/2004   IR - G tube for tonsilar cancer  . IR ANGIO INTRA EXTRACRAN SEL COM CAROTID INNOMINATE UNI R MOD SED  03/09/2019  . IR ANGIO VERTEBRAL SEL VERTEBRAL UNI R MOD SED  03/09/2019  . IR CT HEAD LTD  03/09/2019  . IR PERCUTANEOUS ART THROMBECTOMY/INFUSION INTRACRANIAL INC DIAG ANGIO  03/09/2019  . IR RADIOLOGIST EVAL & MGMT  12/18/2018  . LAMINECTOMY     C5/placement of steel plate  . NECK SURGERY  2003   replaced disk  . RADIOLOGY WITH ANESTHESIA N/A 03/08/2019   Procedure: IR WITH ANESTHESIA;  Surgeon: Luanne Bras, MD;  Location: [facility];  Service: Radiology;  Laterality: N/A;  . TEE WITHOUT CARDIOVERSION N/A 10/15/2015   Procedure: TRANSESOPHAGEAL ECHOCARDIOGRAM (TEE);  Surgeon: Larey Dresser, MD;  Location: [facility];  Service: Cardiovascular;  Laterality: N/A;  . TEE WITHOUT CARDIOVERSION N/A 11/24/2015   Procedure: TRANSESOPHAGEAL ECHOCARDIOGRAM (TEE);  Surgeon: Josue Hector, MD;  Location: [facility];  Service: Cardiovascular;  Laterality: N/A;  .  XI ROBOTIC ASSISTED COLOSTOMY TAKEDOWN N/A 03/07/2019   Procedure: XI ROBOTIC ASSISTED LOW ANTERIOR RESECTION, RIGID PROCTOSCOPY;  Surgeon: Michael Boston, MD;  [facility];  Service: General;  Laterality: N/A;    There were no vitals filed for this visit.  Subjective Assessment - 05/01/19 1239    Subjective   No changes    Pertinent History  Lt MCA CVA 03/08/19. PMH: A-FIB, CAD, h/o CA    Currently in Pain?  No/denies       Assessed remaining goals - see below for 9  hole peg test.  Reviewed previously issued HEP for post sh girdle strengthening and stabalization - pt demo each as indicated for neuro re-education including wt bearing ex's.  Pt picked up and carried 50 # box short distance and simulated job duties w/ 50 # box.  UBE x 8 min, Level 8 resistance (4 min forward, 4 min backwards) for UE strength/endurance Environmental scanning while tossing ball Rt hand finding 13/13 items on first pass.                          OT Long Term Goals - 05/01/19 1427      OT LONG TERM GOAL #1   Title  Independent with HEP for Rt hand coordination and Rt shoulder strength    Time  4    Period  Weeks    Status  Achieved      OT LONG TERM GOAL #2   Title  Improve coordination Rt hand to 25 sec. or less on 9 hole peg test    Baseline  27 sec    Time  4    Period  Weeks    Status  Achieved   23.31 sec     OT LONG TERM GOAL #3   Title  Pt to perform medication management w/ external aids/cues prn I'ly after placed in pillbox by family    Time  4    Period  Weeks    Status  Achieved      OT LONG TERM GOAL #4   Title  Pt to lift up to 50 # BUE's and carrying short distance using proper body mechanics in prep for return to work    Time  4    Period  Weeks    Status  Achieved            Plan - 05/01/19 1428    Clinical Impression Statement  Pt has met all LTG's at this time. Pt doing well with O.T. needs.    Occupational performance deficits (Please refer to evaluation for details):  IADL's;Work    Body Structure / Function / Physical Skills  IADL;FMC;Coordination;Strength;UE functional use;Body mechanics;Vision;Mobility    OT Frequency  2x / week    OT Duration  4 weeks    OT Treatment/Interventions  Therapeutic exercise;Self-care/ADL training;Neuromuscular education;Therapeutic activities;Coping strategies training;Cognitive remediation/compensation;Visual/perceptual remediation/compensation;Patient/family education;Passive  range of motion    Plan  D/C O.Darene Lamer    Consulted and Agree with Plan of Care  Patient       Patient will benefit from skilled therapeutic intervention in order to improve the following deficits and impairments:   Body Structure / Function / Physical Skills: IADL, FMC, Coordination, Strength, UE functional use, Body mechanics, Vision, Mobility       Visit Diagnosis: Muscle weakness (generalized)  Other symptoms and signs involving the nervous system    Problem List Patient Active Problem List  Diagnosis Date Noted  . Hypokalemia 03/12/2019  . Expressive aphasia 03/10/2019  . Dysphagia 03/10/2019  . Middle cerebral artery embolism, left 03/09/2019  . Diverticular stricture ([place]) 03/07/2019  . Bacteria in urine   . Chronic atrial fibrillation 12/02/2018  . Current use of long term anticoagulation 12/02/2018  . UTI (urinary tract infection) 11/29/2018  . Hyponatremia 11/29/2018  . PAF (paroxysmal atrial fibrillation) ([place]) 11/29/2018  . Diverticulitis of large intestine with abscess 09/23/2018  . Dyslipidemia 12/05/2017  . CAD (coronary artery disease), native coronary artery 12/06/2015  . SOB (shortness of breath)   . NICM (nonischemic cardiomyopathy) ([place])   . Thyroid activity decreased   . Hypothyroidism 08/12/2013  . History of radiation therapy 05/30/2012  . Smokes tobacco daily 05/30/2012  . Cancer of tonsillar fossa ([place]) 11/27/2011  . Tonsil cancer ([place]) 2005  . History of chemotherapy 2005    OCCUPATIONAL THERAPY DISCHARGE SUMMARY  Visits from Start of Care: 7  Current functional level related to goals / functional outcomes: Pt has met all LTG's    Remaining deficits: Endurance   Education / Equipment: HEP's, memory compensatory strategies, body mechanics w/ lifting  Plan: Patient agrees to discharge.  Patient goals were met. Patient is being discharged due to meeting the stated rehab goals.  ?????       Carey Bullocks, OTR/L 05/01/2019,  2:29 PM  [facility] [address] [place], [place], [address] Phone: [phone]   Fax:  [phone]  Name: Adrian [name] MRN: [id number] Date of Birth: 1955/10/11

## 2019-05-02 ENCOUNTER — Ambulatory Visit: Payer: BC Managed Care – PPO

## 2019-05-02 ENCOUNTER — Ambulatory Visit: Payer: BC Managed Care – PPO | Admitting: Occupational Therapy

## 2019-05-05 ENCOUNTER — Other Ambulatory Visit: Payer: Self-pay

## 2019-05-05 ENCOUNTER — Ambulatory Visit: Payer: BC Managed Care – PPO | Admitting: Occupational Therapy

## 2019-05-05 ENCOUNTER — Ambulatory Visit: Payer: BC Managed Care – PPO

## 2019-05-05 DIAGNOSIS — R1312 Dysphagia, oropharyngeal phase: Secondary | ICD-10-CM

## 2019-05-05 DIAGNOSIS — R4701 Aphasia: Secondary | ICD-10-CM

## 2019-05-05 NOTE — Patient Instructions (Signed)
  Please complete the assigned speech therapy homework prior to your next session and return it to the speech therapist at your next visit.  

## 2019-05-05 NOTE — Therapy (Signed)
[name] [address] [name], [place], [address] Phone: [phone]   Fax:  [phone]  Speech Language Pathology Treatment  Patient Details  Name: Adrian [name] MRN: [id number] Date of Birth: 09-11-1955 Referring Provider (SLP): Adrian Post, MD (PCP) referred to therapy by NP at hospital   Encounter Date: 05/05/2019  End of Session - 05/05/19 1348    Visit Number  10    Number of Visits  17    Date for SLP Re-Evaluation  05/24/19    Authorization Type  BCBS    SLP Start Time  1103    SLP Stop Time   1145    SLP Time Calculation (min)  42 min    Activity Tolerance  Patient tolerated treatment well       Past Medical History:  Diagnosis Date  . AF (paroxysmal atrial fibrillation) ([place]) 11/29/2018  . Atrial flutter ([place]) 10/12/2015  . CAD (coronary artery disease), native coronary artery 12/06/2015   a. 10/2015 MV: EF  37%, reversible defect inferior apex, intermediate risk findings; b. 10/2015 Cath: 20% mid RCA.  . Colonic diverticular abscess   . Diverticulitis   . History of chemotherapy 2005   Cisplatin  . Hypothyroidism   . NICM (nonischemic cardiomyopathy) ([place]) 10/14/2015   a. Tachy mediated?;  b. Echo 3/17 - Mild concentric LVH, EF 30-35%, anteroseptal, anterior, anterolateral, apical anterior, lateral hypokinesis, trivial MR, mild to moderately reduced RVSF; c. LHC 3/17 - mRCA 20%  . Paroxysmal atrial flutter ([place])    a. TEE 3/17 with ? LAA clot-->s/p TEE/DCCV 11/24/2015  . Radiation NOv.3,2005-Dec. 15, 2005   6810 cGy in 30 fractions  . Tonsil cancer [facility]) 2005   Dr Romeo Rabon.  XRT    Past Surgical History:  Procedure Laterality Date  . APPENDECTOMY N/A 03/07/2019   Procedure: ROBOTIC ASSISTED APPENDECTOMY;  Surgeon: Michael Boston, MD;  [facility];  Service: General;  Laterality: N/A;  . CARDIAC CATHETERIZATION N/A 10/18/2015   Procedure: Left Heart Cath and Coronary Angiography;  Surgeon:  Burnell Blanks, MD;  Location: [place] [facility];  Service: Cardiovascular;  Laterality: N/A;  . CARDIOVERSION N/A 11/24/2015   Procedure: CARDIOVERSION;  Surgeon: Josue Hector, MD;  Location: [place];  Service: Cardiovascular;  Laterality: N/A;  . CYSTOSCOPY WITH STENT PLACEMENT Bilateral 03/07/2019   Procedure: CYSTOSCOPY WITH BILATERAL FIREFLY INJECTION;  Surgeon: Ardis Hughs, MD;  [facility];  Service: Urology;  Laterality: Bilateral;  . GASTROSTOMY TUBE PLACEMENT  07/04/2004   IR - G tube for tonsilar cancer  . IR ANGIO INTRA EXTRACRAN SEL COM CAROTID INNOMINATE UNI R MOD SED  03/09/2019  . IR ANGIO VERTEBRAL SEL VERTEBRAL UNI R MOD SED  03/09/2019  . IR CT HEAD LTD  03/09/2019  . IR PERCUTANEOUS ART THROMBECTOMY/INFUSION INTRACRANIAL INC DIAG ANGIO  03/09/2019  . IR RADIOLOGIST EVAL & MGMT  12/18/2018  . LAMINECTOMY     C5/placement of steel plate  . NECK SURGERY  2003   replaced disk  . RADIOLOGY WITH ANESTHESIA N/A 03/08/2019   Procedure: IR WITH ANESTHESIA;  Surgeon: Luanne Bras, MD;  Location: [facility];  Service: Radiology;  Laterality: N/A;  . TEE WITHOUT CARDIOVERSION N/A 10/15/2015   Procedure: TRANSESOPHAGEAL ECHOCARDIOGRAM (TEE);  Surgeon: Larey Dresser, MD;  Location: [place];  Service: Cardiovascular;  Laterality: N/A;  . TEE WITHOUT CARDIOVERSION N/A 11/24/2015   Procedure: TRANSESOPHAGEAL ECHOCARDIOGRAM (TEE);  Surgeon: Josue Hector, MD;  [facility];  Service: Cardiovascular;  Laterality: N/A;  . XI ROBOTIC ASSISTED COLOSTOMY TAKEDOWN N/A 03/07/2019   Procedure: XI ROBOTIC ASSISTED LOW ANTERIOR RESECTION, RIGID PROCTOSCOPY;  Surgeon: Michael Boston, MD;  [facility];  Service: General;  Laterality: N/A;    There were no vitals filed for this visit.  Subjective Assessment - 05/05/19 1109    Subjective  "how was your weekend?"    Currently in Pain?  No/denies            ADULT SLP TREATMENT - 05/05/19 1110       General Information   Behavior/Cognition  Alert;Cooperative;Pleasant mood      Treatment Provided   Treatment provided  Cognitive-Linquistic      Cognitive-Linquistic Treatment   Treatment focused on  Aphasia    Skilled Treatment  "How did [name] do this weekend?" SLP confirmed with pt his preference to work on aphasia and not swallowing in [name]. Pt did agree with that.  "I can't spell a diddid thing." Given this, SLP worked with pt with some simple common words (colors) with usual mod cues req'd from SLP. SLP then completed letter fill-ins with colors and pt req'd extra time but success was 85% initially improved to 95% with mod A. Pt then wrote out the items to which he filled letters, and had 85% success independently. Pt remarked these would make good homework, so SLP provided pt seven more common word fill-in tasks for homework.       Assessment / Recommendations / Plan   Plan  Continue with current plan of care;Goals updated      Progression Toward Goals   Progression toward goals  Progressing toward goals         SLP Short Term Goals - 05/05/19 1350      SLP SHORT TERM GOAL #1   Title  Pt will demo HEP for dysphagia with rare min A x 3 sessions.    Time  1    Period  Weeks    Status  Partially Met      SLP SHORT TERM GOAL #2   Title  Pt will complete further assessment of cognition and writing and goals added as appropriate, within first 4 visits.    Status  Achieved      SLP SHORT TERM GOAL #3   Title  Pt will utitlize multimodal communication/compensations for anomia in 8 minutes simple-mod complex conversation over 3 sessions with occasional min A    Baseline  04/15/19 04/17/19 04/24/19    Time  1    Period  Weeks    Status  Achieved      SLP SHORT TERM GOAL #4   Title  Pt will alternate attention between 2 functional tasks for 12 minutes >90% accuracy x 2 sessions with compensations allowed.    Baseline  04/17/19    Time  4    Period  Weeks    Status   Partially Met       SLP Long Term Goals - 05/05/19 1350      SLP LONG TERM GOAL #1   Title  Pt will complete HEP for dysphagia with modified independence x 3 sessions.    Time  3    Period  Weeks    Status  On-going      SLP LONG TERM GOAL #2   Title  Pt will participate in repeat MBS for objective assessment of swallow function to determine readiness for diet advancement.  Time  3    Period  Weeks    Status  Deferred   on hold as pt is electing to take thin liquids at this time; will continue to monitor clinically     SLP LONG TERM GOAL #3   Title  Pt will utitlize multimodal communication/compensations for anomia in 12 minutes mod complex conversation over 3 sessions with occasional min A    Time  3    Period  Weeks    Status  On-going      SLP LONG TERM GOAL #4   Title  Pt will demo functional divided attention for 15 minutes x2 sessions, >95% accuracy on tasks with double checking allowed, x2 sessions.    Time  3    Period  Weeks    Status  New      SLP LONG TERM GOAL #5   Title  Patient will compose written text messages and/or brief email responses using compensations (speech-to-text).    Time  3    Period  Weeks    Status  On-going       Plan - 05/05/19 1348    Clinical Impression Statement  Mr. Wert cont to present with mild anomic aphasia, mild dysarthria and oropharyngeal dysphagia. I would classify his deficit as mild; pt presents slightly better langauge ability than previous ST visit in which this SLP saw pt.  I recommend cont'd skilled ST to address communication and swallowing impairments, in order to maximize pt's independence and safety, increase his ability to communicate wants and needs, and improve quality of life.    Speech Therapy Frequency  2x / week    Duration  --   8 weeks/17 visits   Treatment/Interventions  Aspiration precaution training;Diet toleration management by SLP;Environmental controls;Trials of upgraded texture/liquids;Cognitive  reorganization;Oral motor exercises;Multimodal communcation approach;Compensatory strategies;Pharyngeal strengthening exercises;Language facilitation;Compensatory techniques;Cueing hierarchy;Internal/external aids;Functional tasks;SLP instruction and feedback;Patient/family education    Potential to Achieve Goals  Good    Consulted and Agree with Plan of Care  Patient       Patient will benefit from skilled therapeutic intervention in order to improve the following deficits and impairments:   Aphasia  Dysphagia, oropharyngeal phase    Problem List Patient Active Problem List   Diagnosis Date Noted  . Hypokalemia 03/12/2019  . Expressive aphasia 03/10/2019  . Dysphagia 03/10/2019  . Middle cerebral artery embolism, left 03/09/2019  . Diverticular stricture ([place]) 03/07/2019  . Bacteria in urine   . Chronic atrial fibrillation 12/02/2018  . Current use of long term anticoagulation 12/02/2018  . UTI (urinary tract infection) 11/29/2018  . Hyponatremia 11/29/2018  . PAF (paroxysmal atrial fibrillation) ([place]) 11/29/2018  . Diverticulitis of large intestine with abscess 09/23/2018  . Dyslipidemia 12/05/2017  . CAD (coronary artery disease), native coronary artery 12/06/2015  . SOB (shortness of breath)   . NICM (nonischemic cardiomyopathy) ([place])   . Thyroid activity decreased   . Hypothyroidism 08/12/2013  . History of radiation therapy 05/30/2012  . Smokes tobacco daily 05/30/2012  . Cancer of tonsillar fossa ([place]) 11/27/2011  . Tonsil cancer ([place]) 2005  . History of chemotherapy 2005    [facility] ,MS, [place]  05/05/2019, 1:51 PM  [place] [address] [place], [place], [address] Phone: [phone]   Fax:  [phone]   Name: Adrian [name] MRN: [id number] Date of Birth: Jul 02, 1956

## 2019-05-06 ENCOUNTER — Ambulatory Visit: Payer: BC Managed Care – PPO

## 2019-05-06 ENCOUNTER — Ambulatory Visit: Payer: BC Managed Care – PPO | Admitting: Occupational Therapy

## 2019-05-07 ENCOUNTER — Encounter: Payer: Self-pay | Admitting: Physical Therapy

## 2019-05-07 ENCOUNTER — Ambulatory Visit: Payer: BC Managed Care – PPO | Admitting: Physical Therapy

## 2019-05-07 ENCOUNTER — Other Ambulatory Visit: Payer: Self-pay

## 2019-05-07 DIAGNOSIS — M6281 Muscle weakness (generalized): Secondary | ICD-10-CM

## 2019-05-07 DIAGNOSIS — R2681 Unsteadiness on feet: Secondary | ICD-10-CM

## 2019-05-07 DIAGNOSIS — R1312 Dysphagia, oropharyngeal phase: Secondary | ICD-10-CM | POA: Diagnosis not present

## 2019-05-07 NOTE — Therapy (Signed)
[place], [place], [address] Phone: [phone]   Fax:  [phone]  Name: Adrian [name] MRN: [id number] Date of Birth: [birth year]  [place], [place], [address] Phone: [phone]   Fax:  [phone]  Name: Adrian [name] MRN: [id number] Date of Birth: [birth year]  [place], [place], [address] Phone: [phone]   Fax:  [phone]  Physical Therapy Treatment  Patient Details  Name: Adrian [name] MRN: [id number] Date of Birth: 20-Jan-1956 Referring Provider (PT): [name], MD (PCP) referred to therapy by NP at hospital   Encounter Date: 05/07/2019  PT End of Session - 05/07/19 1101    Visit Number  9    Date for PT Re-Evaluation  05/11/19    Authorization Type  BCBS    PT Start Time  1014    PT Stop Time  1057    PT Time Calculation (min)  43 min    Activity Tolerance  Patient tolerated treatment well    Behavior During Therapy  [facility] for tasks assessed/performed       Past Medical History:  Diagnosis Date  . AF (paroxysmal atrial fibrillation) ([place]) 11/29/2018  . Atrial flutter ([place]) 10/12/2015  . CAD (coronary artery disease), native coronary artery 12/06/2015   a. 10/2015 MV: EF  37%, reversible defect inferior apex, intermediate risk findings; b. 10/2015 Cath: 20% mid RCA.  . Colonic diverticular abscess   . Diverticulitis   . History of chemotherapy 2005   Cisplatin  . Hypothyroidism   . NICM (nonischemic cardiomyopathy) ([place]) 10/14/2015   a. Tachy mediated?;  b. Echo 3/17 - Mild concentric LVH, EF 30-35%, anteroseptal, anterior, anterolateral, apical anterior, lateral hypokinesis, trivial MR, mild to moderately reduced RVSF; c. LHC 3/17 - mRCA 20%  . Paroxysmal atrial flutter ([place])    a. TEE 3/17 with ? LAA clot-->s/p TEE/DCCV 11/24/2015  . Radiation NOv.3,2005-Dec. 15, 2005   6810 cGy in 30 fractions  . Tonsil cancer [facility]) 2005   Dr Romeo Rabon.  XRT    Past Surgical History:  Procedure Laterality Date  . APPENDECTOMY N/A 03/07/2019   Procedure: ROBOTIC ASSISTED APPENDECTOMY;  Surgeon:  Boston, MD;  [facility];  Service: General;  Laterality: N/A;  . CARDIAC CATHETERIZATION N/A 10/18/2015   Procedure: Left Heart Cath and Coronary  Angiography;  Surgeon: Burnell Blanks, MD;  Location: [facility];  Service: Cardiovascular;  Laterality: N/A;  . CARDIOVERSION N/A 11/24/2015   Procedure: CARDIOVERSION;  Surgeon: Josue Hector, MD;  Location: [place];  Service: Cardiovascular;  Laterality: N/A;  . CYSTOSCOPY WITH STENT PLACEMENT Bilateral 03/07/2019   Procedure: CYSTOSCOPY WITH BILATERAL FIREFLY INJECTION;  Surgeon: Ardis Hughs, MD;  [facility];  Service: Urology;  Laterality: Bilateral;  . GASTROSTOMY TUBE PLACEMENT  07/04/2004   IR - G tube for tonsilar cancer  . IR ANGIO INTRA EXTRACRAN SEL COM CAROTID INNOMINATE UNI R MOD SED  03/09/2019  . IR ANGIO VERTEBRAL SEL VERTEBRAL UNI R MOD SED  03/09/2019  . IR CT HEAD LTD  03/09/2019  . IR PERCUTANEOUS ART THROMBECTOMY/INFUSION INTRACRANIAL INC DIAG ANGIO  03/09/2019  . IR RADIOLOGIST EVAL & MGMT  12/18/2018  . LAMINECTOMY     C5/placement of steel plate  . NECK SURGERY  2003   replaced disk  . RADIOLOGY WITH ANESTHESIA N/A 03/08/2019   Procedure: IR WITH ANESTHESIA;  Surgeon: Luanne Bras, MD;  Location: [facility];  Service: Radiology;  Laterality: N/A;  . TEE WITHOUT CARDIOVERSION N/A 10/15/2015   Procedure: TRANSESOPHAGEAL ECHOCARDIOGRAM (TEE);  Surgeon: Larey Dresser, MD;  Location: [facility];  Service: Cardiovascular;  Laterality: N/A;  . TEE WITHOUT CARDIOVERSION N/A 11/24/2015   Procedure: TRANSESOPHAGEAL ECHOCARDIOGRAM (TEE);  Surgeon: Collier Salina

## 2019-05-08 ENCOUNTER — Ambulatory Visit: Payer: BC Managed Care – PPO | Attending: Nurse Practitioner | Admitting: Speech Pathology

## 2019-05-08 DIAGNOSIS — R41841 Cognitive communication deficit: Secondary | ICD-10-CM | POA: Diagnosis present

## 2019-05-08 DIAGNOSIS — M6281 Muscle weakness (generalized): Secondary | ICD-10-CM | POA: Diagnosis present

## 2019-05-08 DIAGNOSIS — R2681 Unsteadiness on feet: Secondary | ICD-10-CM | POA: Diagnosis present

## 2019-05-08 DIAGNOSIS — R4701 Aphasia: Secondary | ICD-10-CM

## 2019-05-08 DIAGNOSIS — R1312 Dysphagia, oropharyngeal phase: Secondary | ICD-10-CM

## 2019-05-08 NOTE — Therapy (Signed)
[facility] [address] [place], [place], [address] Phone: [phone]   Fax:  [phone]  Speech Language Pathology Treatment  Patient Details  Name: Adrian [name] MRN: [id number] Date of Birth: 01/16/1956 Referring Provider (SLP): [name], MD (PCP) referred to therapy by NP at hospital   Encounter Date: 05/08/2019  End of Session - 05/08/19 1325    Visit Number  11    Number of Visits  17    Date for SLP Re-Evaluation  05/24/19    Authorization Type  BCBS    SLP Start Time  8546    SLP Stop Time   1058    SLP Time Calculation (min)  43 min    Activity Tolerance  Patient tolerated treatment well       Past Medical History:  Diagnosis Date  . AF (paroxysmal atrial fibrillation) ([place]) 11/29/2018  . Atrial flutter ([place]) 10/12/2015  . CAD (coronary artery disease), native coronary artery 12/06/2015   a. 10/2015 MV: EF  37%, reversible defect inferior apex, intermediate risk findings; b. 10/2015 Cath: 20% mid RCA.  . Colonic diverticular abscess   . Diverticulitis   . History of chemotherapy 2005   Cisplatin  . Hypothyroidism   . NICM (nonischemic cardiomyopathy) ([place]) 10/14/2015   a. Tachy mediated?;  b. Echo 3/17 - Mild concentric LVH, EF 30-35%, anteroseptal, anterior, anterolateral, apical anterior, lateral hypokinesis, trivial MR, mild to moderately reduced RVSF; c. LHC 3/17 - mRCA 20%  . Paroxysmal atrial flutter ([place])    a. TEE 3/17 with ? LAA clot-->s/p TEE/DCCV 11/24/2015  . Radiation NOv.3,2005-Dec. 15, 2005   6810 cGy in 30 fractions  . 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  XRT    Past Surgical History:  Procedure Laterality Date  . APPENDECTOMY N/A 03/07/2019   Procedure: ROBOTIC ASSISTED APPENDECTOMY;  Surgeon: Michael Boston, MD;  [facility];  Service: General;  Laterality: N/A;  . CARDIAC CATHETERIZATION N/A 10/18/2015   Procedure: Left Heart Cath and Coronary Angiography;  Surgeon:  Burnell Blanks, MD;  Location: [place] [facility];  Service: Cardiovascular;  Laterality: N/A;  . CARDIOVERSION N/A 11/24/2015   Procedure: CARDIOVERSION;  Surgeon: Josue Hector, MD;  Location: [facility];  Service: Cardiovascular;  Laterality: N/A;  . CYSTOSCOPY WITH STENT PLACEMENT Bilateral 03/07/2019   Procedure: CYSTOSCOPY WITH BILATERAL FIREFLY INJECTION;  Surgeon: Ardis Hughs, MD;  [facility];  Service: Urology;  Laterality: Bilateral;  . GASTROSTOMY TUBE PLACEMENT  07/04/2004   IR - G tube for tonsilar cancer  . IR ANGIO INTRA EXTRACRAN SEL COM CAROTID INNOMINATE UNI R MOD SED  03/09/2019  . IR ANGIO VERTEBRAL SEL VERTEBRAL UNI R MOD SED  03/09/2019  . IR CT HEAD LTD  03/09/2019  . IR PERCUTANEOUS ART THROMBECTOMY/INFUSION INTRACRANIAL INC DIAG ANGIO  03/09/2019  . IR RADIOLOGIST EVAL & MGMT  12/18/2018  . LAMINECTOMY     C5/placement of steel plate  . NECK SURGERY  2003   replaced disk  . RADIOLOGY WITH ANESTHESIA N/A 03/08/2019   Procedure: IR WITH ANESTHESIA;  Surgeon: Luanne Bras, MD;  Location: [facility];  Service: Radiology;  Laterality: N/A;  . TEE WITHOUT CARDIOVERSION N/A 10/15/2015   Procedure: TRANSESOPHAGEAL ECHOCARDIOGRAM (TEE);  Surgeon: Larey Dresser, MD;  Location: [place];  Service: Cardiovascular;  Laterality: N/A;  . TEE WITHOUT CARDIOVERSION N/A 11/24/2015   Procedure: TRANSESOPHAGEAL ECHOCARDIOGRAM (TEE);  Surgeon: Josue Hector, MD;  [facility];  Service: Cardiovascular;  Laterality: N/A;  . XI ROBOTIC ASSISTED COLOSTOMY TAKEDOWN N/A 03/07/2019   Procedure: XI ROBOTIC ASSISTED LOW ANTERIOR RESECTION, RIGID PROCTOSCOPY;  Surgeon: Michael Boston, MD;  [facility];  Service: General;  Laterality: N/A;    There were no vitals filed for this visit.  Subjective Assessment - 05/08/19 1307    Subjective  "We should go outside."    Currently in Pain?  No/denies            ADULT SLP TREATMENT - 05/08/19 1307       General Information   Behavior/Cognition  Alert;Cooperative;Pleasant mood      Treatment Provided   Treatment provided  Cognitive-Linquistic      Pain Assessment   Pain Assessment  No/denies pain      Cognitive-Linquistic Treatment   Treatment focused on  Aphasia    Skilled Treatment  Based on pt's "S" SLP took pt outside to picnic table for session today. Pt's language more fluent today compared with last session with this therapist. Pt without instance of anomia in simple conversation. 10 minutes mod complex conversation was functional, with pt requiring min question cue x1 (insurance) when discussing his return to work/retirement plans. Patient continues to report frustration with written expression, which he reports limits him when doing web search. Patient did not have his phone with him today; SLP told pt he can use speech-to-text for web searches in addition to texting (which pt reports he has been doing fairly frequently). Pt showed SLP generative naming task he worked on at home; intermittently aware of errors. Usual mod A to correct errors. Pt given personally relevant topics for generative naming practice at home (food/cooking and hobby-related).       Assessment / Recommendations / Plan   Plan  Continue with current plan of care;Goals updated      Progression Toward Goals   Progression toward goals  Progressing toward goals       SLP Education - 05/08/19 1324    Education Details  consider impacts of stress, fatigue when returning to work; stroke risk factors    Person(s) Educated  Patient    Methods  Explanation    Comprehension  Verbalized understanding       SLP Short Term Goals - 05/08/19 1311      SLP SHORT TERM GOAL #1   Title  Pt will demo HEP for dysphagia with rare min A x 3 sessions.    Time  1    Period  Weeks    Status  Partially Met      SLP SHORT TERM GOAL #2   Title  Pt will complete further assessment of cognition and writing and goals added as  appropriate, within first 4 visits.    Status  Achieved      SLP SHORT TERM GOAL #3   Title  Pt will utitlize multimodal communication/compensations for anomia in 8 minutes simple-mod complex conversation over 3 sessions with occasional min A    Baseline  04/15/19 04/17/19 04/24/19    Time  1    Period  Weeks    Status  Achieved      SLP SHORT TERM GOAL #4   Title  Pt will alternate attention between 2 functional tasks for 12 minutes >90% accuracy x 2 sessions with compensations allowed.    Baseline  04/17/19    Time  4    Period  Weeks    Status  Partially Met  SLP Long Term Goals - 05/08/19 1311      SLP LONG TERM GOAL #1   Title  Pt will complete HEP for dysphagia with modified independence x 3 sessions.    Time  3    Period  Weeks    Status  Deferred   pt wishes to focus on language expression at this time     SLP LONG TERM GOAL #2   Title  Pt will participate in repeat MBS for objective assessment of swallow function to determine readiness for diet advancement.    Time  3    Period  Weeks    Status  Deferred   on hold as pt is electing to take thin liquids at this time; will continue to monitor clinically     SLP LONG TERM GOAL #3   Title  Pt will utitlize multimodal communication/compensations for anomia in 12 minutes mod complex conversation over 3 sessions with occasional min A    Baseline  05/08/11    Time  3    Period  Weeks    Status  On-going      SLP LONG TERM GOAL #4   Title  Pt will demo functional divided attention for 15 minutes x2 sessions, >95% accuracy on tasks with double checking allowed, x2 sessions.    Time  3    Period  Weeks    Status  On-going      SLP LONG TERM GOAL #5   Title  Patient will compose written text messages and/or brief email responses using compensations (speech-to-text).    Time  3    Period  Weeks    Status  On-going       Plan - 05/08/19 1325    Clinical Impression Statement  Mr. Dunklee cont to present with mild  anomic aphasia, mild dysarthria and oropharyngeal dysphagia. I would classify his deficit as mild; 10 minutes mod complex conversation was functional today which compensations and extended time. I recommend cont'd skilled ST to address communication and swallowing impairments, in order to maximize pt's independence and safety, increase his ability to communicate wants and needs, and improve quality of life.    Speech Therapy Frequency  2x / week    Duration  --   8 weeks/17 visits   Treatment/Interventions  Aspiration precaution training;Diet toleration management by SLP;Environmental controls;Trials of upgraded texture/liquids;Cognitive reorganization;Oral motor exercises;Multimodal communcation approach;Compensatory strategies;Pharyngeal strengthening exercises;Language facilitation;Compensatory techniques;Cueing hierarchy;Internal/external aids;Functional tasks;SLP instruction and feedback;Patient/family education    Potential to Achieve Goals  Good    Consulted and Agree with Plan of Care  Patient       Patient will benefit from skilled therapeutic intervention in order to improve the following deficits and impairments:   Aphasia  Dysphagia, oropharyngeal phase  Cognitive communication deficit    Problem List Patient Active Problem List   Diagnosis Date Noted  . Hypokalemia 03/12/2019  . Expressive aphasia 03/10/2019  . Dysphagia 03/10/2019  . Middle cerebral artery embolism, left 03/09/2019  . Diverticular stricture ([place]) 03/07/2019  . Bacteria in urine   . Chronic atrial fibrillation ([place]) 12/02/2018  . Current use of long term anticoagulation 12/02/2018  . UTI (urinary tract infection) 11/29/2018  . Hyponatremia 11/29/2018  . PAF (paroxysmal atrial fibrillation) ([place]) 11/29/2018  . Diverticulitis of large intestine with abscess 09/23/2018  . Dyslipidemia 12/05/2017  . CAD (coronary artery disease), native coronary artery 12/06/2015  . SOB (shortness of breath)   . NICM  (nonischemic cardiomyopathy) ([place])   .  Thyroid activity decreased   . Hypothyroidism 08/12/2013  . History of radiation therapy 05/30/2012  . Smokes tobacco daily 05/30/2012  . Cancer of tonsillar fossa ([place]) 11/27/2011  . Tonsil cancer ([place]) 2005  . History of chemotherapy 2005   Deneise Lever, MS, CCC-SLP Speech-Language Pathologist  Aliene Altes 05/08/2019, 1:26 PM  [facility] [place] [address] [place], [place], [address] Phone: [phone]   Fax:  [phone]   Name: SAMAN UMSTEAD MRN: [id number] Date of Birth: Sep 09, 1955

## 2019-05-09 ENCOUNTER — Encounter: Payer: Self-pay | Admitting: Physical Therapy

## 2019-05-09 ENCOUNTER — Other Ambulatory Visit: Payer: Self-pay

## 2019-05-09 ENCOUNTER — Ambulatory Visit: Payer: BC Managed Care – PPO | Admitting: Physical Therapy

## 2019-05-09 DIAGNOSIS — R2681 Unsteadiness on feet: Secondary | ICD-10-CM

## 2019-05-09 DIAGNOSIS — R4701 Aphasia: Secondary | ICD-10-CM | POA: Diagnosis not present

## 2019-05-09 DIAGNOSIS — M6281 Muscle weakness (generalized): Secondary | ICD-10-CM

## 2019-05-09 NOTE — Therapy (Signed)
[place], [place], [address] Phone: [phone]   Fax:  [phone]  Physical Therapy Treatment  Patient Details  Name: Adrian [name] MRN: [id number] Date of Birth: 07-May-1956 Referring Provider (PT): [name], MD (PCP) referred to therapy by NP at hospital   Encounter Date: 05/09/2019  PT End of Session - 05/09/19 1053    Visit Number  10    Date for PT Re-Evaluation  05/11/19    Authorization Type  BC[employer]    PT Start Time  1015    PT Stop Time  1055    PT Time Calculation (min)  40 min    Activity Tolerance  Patient tolerated treatment well    Behavior During Therapy  [facility] for tasks assessed/performed       Past Medical History:  Diagnosis Date  . AF (paroxysmal atrial fibrillation) ([place]) 11/29/2018  . Atrial flutter ([place]) 10/12/2015  . CAD (coronary artery disease), native coronary artery 12/06/2015   a. 10/2015 MV: EF  37%, reversible defect inferior apex, intermediate risk findings; b. 10/2015 Cath: 20% mid RCA.  . Colonic diverticular abscess   . Diverticulitis   . History of chemotherapy 2005   Cisplatin  . Hypothyroidism   . NICM (nonischemic cardiomyopathy) ([place]) 10/14/2015   a. Tachy mediated?;  b. Echo 3/17 - Mild concentric LVH, EF 30-35%, anteroseptal, anterior, anterolateral, apical anterior, lateral hypokinesis, trivial MR, mild to moderately reduced RVSF; c. LHC 3/17 - mRCA 20%  . Paroxysmal atrial flutter ([place])    a. TEE 3/17 with ? LAA clot-->s/p TEE/DCCV 11/24/2015  . Radiation NOv.3,2005-Dec. 15, 2005   6810 cGy in 30 fractions  . Tonsil cancer [facility]) 2005   Dr Romeo Rabon.  XRT    Past Surgical History:  Procedure Laterality Date  . APPENDECTOMY N/A 03/07/2019   Procedure: ROBOTIC ASSISTED APPENDECTOMY;  Surgeon: Michael Boston, MD;  [facility];  Service: General;  Laterality: N/A;  . CARDIAC CATHETERIZATION N/A 10/18/2015   Procedure: Left Heart Cath and Coronary  Angiography;  Surgeon: Burnell Blanks, MD;  Location: [facility];  Service: Cardiovascular;  Laterality: N/A;  . CARDIOVERSION N/A 11/24/2015   Procedure: CARDIOVERSION;  Surgeon: Josue Hector, MD;  Location: [place];  Service: Cardiovascular;  Laterality: N/A;  . CYSTOSCOPY WITH STENT PLACEMENT Bilateral 03/07/2019   Procedure: CYSTOSCOPY WITH BILATERAL FIREFLY INJECTION;  Surgeon: Ardis Hughs, MD;  [facility];  Service: Urology;  Laterality: Bilateral;  . GASTROSTOMY TUBE PLACEMENT  07/04/2004   IR - G tube for tonsilar cancer  . IR ANGIO INTRA EXTRACRAN SEL COM CAROTID INNOMINATE UNI R MOD SED  03/09/2019  . IR ANGIO VERTEBRAL SEL VERTEBRAL UNI R MOD SED  03/09/2019  . IR CT HEAD LTD  03/09/2019  . IR PERCUTANEOUS ART THROMBECTOMY/INFUSION INTRACRANIAL INC DIAG ANGIO  03/09/2019  . IR RADIOLOGIST EVAL & MGMT  12/18/2018  . LAMINECTOMY     C5/placement of steel plate  . NECK SURGERY  2003   replaced disk  . RADIOLOGY WITH ANESTHESIA N/A 03/08/2019   Procedure: IR WITH ANESTHESIA;  Surgeon: Luanne Bras, MD;  Location: [place];  Service: Radiology;  Laterality: N/A;  . TEE WITHOUT CARDIOVERSION N/A 10/15/2015   Procedure: TRANSESOPHAGEAL ECHOCARDIOGRAM (TEE);  Surgeon: Larey Dresser, MD;  Location: [facility] [place];  Service: Cardiovascular;  Laterality: N/A;  . TEE WITHOUT CARDIOVERSION N/A 11/24/2015   Procedure: TRANSESOPHAGEAL ECHOCARDIOGRAM (TEE);  Surgeon: Collier Salina  Tommy Rainwater, MD;  Location: [facility];  Service: Cardiovascular;  Laterality: N/A;  . XI ROBOTIC ASSISTED COLOSTOMY TAKEDOWN N/A 03/07/2019   Procedure: XI ROBOTIC ASSISTED LOW ANTERIOR RESECTION, RIGID PROCTOSCOPY;  Surgeon: Michael Boston, MD;  [facility];  Service: General;  Laterality: N/A;    There were no vitals filed for this visit.  Subjective Assessment - 05/09/19 1017    Subjective  "Im all right"    Currently in Pain?  No/denies                       [facility] Adult PT  Treatment/Exercise - 05/09/19 0001      Exercises   Exercises  Lumbar      Lumbar Exercises: Machines for Strengthening   Cybex Leg Press  60# 3x10, single legs 20# 2x10; Heel raises 60lb 2x10     Elliptical  I13 R 7 x56min       Lumbar Exercises: Standing   Other Standing Lumbar Exercises  Low squats to OHP blue ball 2x10       Lumbar Exercises: Prone   Other Prone Lumbar Exercises  Plank to push up 2x10       Knee/Hip Exercises: Machines for Strengthening   Other Machine  45# seated rows, and lats 2x10, 35# chest press      Knee/Hip Exercises: Standing   Forward Step Up  Both;2 sets;10 reps;Hand Hold: 0   on mat table    Other Standing Knee Exercises  Bottom loaded squat 42.5lb 2x15                  PT Long Term Goals - 05/07/19 1103      PT LONG TERM GOAL #1   Title  Pt will demonstrate independence with balance and strengthening HEP      PT LONG TERM GOAL #2   Title  Pt will improve FGA to 30/30    Status  Achieved            Plan - 05/09/19 1054    Clinical Impression Statement  Pt tolerated today's interventions well evident by no subjective reports of increase pain. Step ups on mat table was very taxing on pt. Increase with on bilat leg press was good, but SL was difficult. Core weakness noted with plank to pushup    Comorbidities  tonsil cancer with radiation and chemotherapy in 2005, cardiomyopathy, atrial flutter, hypothyroidism, diverticulitis with abcess, CAD, and C5 laminectomy    Examination-Activity Limitations  Carry;Lift;Reach Overhead;Squat    Examination-Participation Restrictions  Driving;Other    Stability/Clinical Decision Making  Evolving/Moderate complexity    Rehab Potential  Good    PT Frequency  1x / week    PT Duration  6 weeks    PT Treatment/Interventions  ADLs/Self Care Home Management;Gait training;Stair training;Functional mobility training;Therapeutic activities;Therapeutic exercise;Balance training;Neuromuscular  re-education;Patient/family education;Other (comment)    PT Next Visit Plan  continue to see him 2x a week to prepare him for his job.       Patient will benefit from skilled therapeutic intervention in order to improve the following deficits and impairments:  Decreased balance, Decreased endurance, Difficulty walking, Decreased strength  Visit Diagnosis: Muscle weakness (generalized)  Unsteadiness on feet     Problem List Patient Active Problem List   Diagnosis Date Noted  . Hypokalemia 03/12/2019  . Expressive aphasia 03/10/2019  . Dysphagia 03/10/2019  . Middle cerebral artery embolism, left 03/09/2019  . Diverticular stricture ([place]) 03/07/2019  .  Bacteria in urine   . Chronic atrial fibrillation ([place]) 12/02/2018  . Current use of long term anticoagulation 12/02/2018  . UTI (urinary tract infection) 11/29/2018  . Hyponatremia 11/29/2018  . PAF (paroxysmal atrial fibrillation) ([place]) 11/29/2018  . Diverticulitis of large intestine with abscess 09/23/2018  . Dyslipidemia 12/05/2017  . CAD (coronary artery disease), native coronary artery 12/06/2015  . SOB (shortness of breath)   . NICM (nonischemic cardiomyopathy) ([place])   . Thyroid activity decreased   . Hypothyroidism 08/12/2013  . History of radiation therapy 05/30/2012  . Smokes tobacco daily 05/30/2012  . Cancer of tonsillar fossa ([place]) 11/27/2011  . Tonsil cancer ([place]) 2005  . History of chemotherapy 2005    Scot Jun, PTA 05/09/2019, 10:56 AM  [place], [place], [address] Phone: [phone]   Fax:  [phone]  Name: Adrian [name] MRN: [id number] Date of Birth: 05-Jan-1956

## 2019-05-13 ENCOUNTER — Other Ambulatory Visit: Payer: Self-pay

## 2019-05-13 ENCOUNTER — Ambulatory Visit: Payer: BC Managed Care – PPO

## 2019-05-13 DIAGNOSIS — R4701 Aphasia: Secondary | ICD-10-CM

## 2019-05-13 DIAGNOSIS — R1312 Dysphagia, oropharyngeal phase: Secondary | ICD-10-CM

## 2019-05-13 DIAGNOSIS — R41841 Cognitive communication deficit: Secondary | ICD-10-CM

## 2019-05-13 NOTE — Therapy (Signed)
[facility] [address] [place], [place], [address] Phone: [phone]   Fax:  [phone]  Speech Language Pathology Treatment  Patient Details  Name: Adrian [name] Adrian [name] Date of Birth: May 25, 1956 Referring Provider (SLP): [name], MD (PCP) referred to therapy by NP at hospital   Encounter Date: 05/13/2019  End of Session - 05/13/19 1103    Visit Number  12    Date for SLP Re-Evaluation  05/24/19    SLP Start Time  1018    SLP Stop Time   1100    SLP Time Calculation (min)  42 min    Activity Tolerance  Patient tolerated treatment well       Past Medical History:  Diagnosis Date  . AF (paroxysmal atrial fibrillation) ([place]) 11/29/2018  . Atrial flutter ([place]) 10/12/2015  . CAD (coronary artery disease), native coronary artery 12/06/2015   a. 10/2015 MV: EF  37%, reversible defect inferior apex, intermediate risk findings; b. 10/2015 Cath: 20% mid RCA.  . Colonic diverticular abscess   . Diverticulitis   . History of chemotherapy 2005   Cisplatin  . Hypothyroidism   . NICM (nonischemic cardiomyopathy) ([place]) 10/14/2015   a. Tachy mediated?;  b. Echo 3/17 - Mild concentric LVH, EF 30-35%, anteroseptal, anterior, anterolateral, apical anterior, lateral hypokinesis, trivial MR, mild to moderately reduced RVSF; c. LHC 3/17 - mRCA 20%  . Paroxysmal atrial flutter ([place])    a. TEE 3/17 with ? LAA clot-->s/p TEE/DCCV 11/24/2015  . Radiation NOv.3,2005-Dec. 15, 2005   6810 cGy in 30 fractions  . Tonsil cancer [facility]) 2005   Dr Romeo Rabon.  XRT    Past Surgical History:  Procedure Laterality Date  . APPENDECTOMY N/A 03/07/2019   Procedure: ROBOTIC ASSISTED APPENDECTOMY;  Surgeon: Michael Boston, MD;  [facility];  Service: General;  Laterality: N/A;  . CARDIAC CATHETERIZATION N/A 10/18/2015   Procedure: Left Heart Cath and Coronary Angiography;  Surgeon: Burnell Blanks, MD;  Location: [place] [facility];  Service: Cardiovascular;  Laterality: N/A;  . CARDIOVERSION N/A 11/24/2015   Procedure: CARDIOVERSION;  Surgeon: Josue Hector, MD;  Location: [place];  Service: Cardiovascular;  Laterality: N/A;  . CYSTOSCOPY WITH STENT PLACEMENT Bilateral 03/07/2019   Procedure: CYSTOSCOPY WITH BILATERAL FIREFLY INJECTION;  Surgeon: Ardis Hughs, MD;  [facility];  Service: Urology;  Laterality: Bilateral;  . GASTROSTOMY TUBE PLACEMENT  07/04/2004   IR - G tube for tonsilar cancer  . IR ANGIO INTRA EXTRACRAN SEL COM CAROTID INNOMINATE UNI R MOD SED  03/09/2019  . IR ANGIO VERTEBRAL SEL VERTEBRAL UNI R MOD SED  03/09/2019  . IR CT HEAD LTD  03/09/2019  . IR PERCUTANEOUS ART THROMBECTOMY/INFUSION INTRACRANIAL INC DIAG ANGIO  03/09/2019  . IR RADIOLOGIST EVAL & MGMT  12/18/2018  . LAMINECTOMY     C5/placement of steel plate  . NECK SURGERY  2003   replaced disk  . RADIOLOGY WITH ANESTHESIA N/A 03/08/2019   Procedure: IR WITH ANESTHESIA;  Surgeon: Luanne Bras, MD;  Location: [facility];  Service: Radiology;  Laterality: N/A;  . TEE WITHOUT CARDIOVERSION N/A 10/15/2015   Procedure: TRANSESOPHAGEAL ECHOCARDIOGRAM (TEE);  Surgeon: Larey Dresser, MD;  Location: [facility];  Service: Cardiovascular;  Laterality: N/A;  . TEE WITHOUT CARDIOVERSION N/A 11/24/2015   Procedure: TRANSESOPHAGEAL ECHOCARDIOGRAM (TEE);  Surgeon: Josue Hector, MD;  Location: [facility];  Service: Cardiovascular;  Laterality: N/A;  . XI ROBOTIC ASSISTED COLOSTOMY TAKEDOWN  N/A 03/07/2019   Procedure: XI ROBOTIC ASSISTED LOW ANTERIOR RESECTION, RIGID PROCTOSCOPY;  Surgeon: Michael Boston, MD;  [facility];  Service: General;  Laterality: N/A;    There were no vitals filed for this visit.  Subjective Assessment - 05/13/19 1025    Subjective  "It went well." (speech/language)    Currently in Pain?  No/denies            ADULT SLP TREATMENT - 05/13/19 1025      General Information   Behavior/Cognition   Alert;Cooperative;Pleasant mood      Treatment Provided   Treatment provided  Cognitive-Linquistic      Cognitive-Linquistic Treatment   Treatment focused on  Aphasia    Skilled Treatment  (pt, re: his talking) "It (I) was talking well. When I talk about - something - I don't know as much about or I'm - - passionate, I don't do so well." SLP had pt clafiry and he meant "....I don't do as well." Pt returning to work on 05-25-19. Mod complex conversation about pt's thoughts of return to work. Pt functional with min extra time - error awareness 90%. 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 In mod to complex conversation re: how [employer] hires individuals and why it is hard to do so pt was functional and error awareness was excellent.       Assessment / Recommendations / Plan   Plan  Continue with current plan of care;Goals updated      Progression Toward Goals   Progression toward goals  Progressing toward goals       SLP Education - 05/13/19 1102    Education Details  pt appears functional with langauge - SLP would like pt to become more fluid with langauge    Person(s) Educated  Patient    Methods  Explanation    Comprehension  Verbalized understanding   pt agreed with SLP      SLP Short Term Goals - 05/13/19 1105      SLP SHORT TERM GOAL #1   Title  Pt will demo HEP for dysphagia with rare min A x 3 sessions.    Time  1    Period  Weeks    Status  Partially Met      SLP SHORT TERM GOAL #2   Title  Pt will complete further assessment of cognition and writing and goals added as appropriate, within first 4 visits.    Status  Achieved      SLP SHORT TERM GOAL #3   Title  Pt will utitlize multimodal communication/compensations for anomia in 8 minutes simple-mod complex conversation over 3 sessions with occasional min A    Baseline  04/15/19 04/17/19 04/24/19    Time  1    Period  Weeks    Status  Achieved      SLP  SHORT TERM GOAL #4   Title  Pt will alternate attention between 2 functional tasks for 12 minutes >90% accuracy x 2 sessions with compensations allowed.    Baseline  04/17/19    Time  4    Period  Weeks    Status  Partially Met       SLP Long Term Goals - 05/13/19 1105      SLP LONG TERM GOAL #1   Title  Pt will complete HEP for dysphagia with modified independence x 3 sessions.    Time  2  Period  Weeks    Status  Deferred   pt wishes to focus on language expression at this time     SLP LONG TERM GOAL #2   Title  Pt will participate in repeat MBS for objective assessment of swallow function to determine readiness for diet advancement.    Status  Deferred   on hold as pt is electing to take thin liquids at this time; will continue to monitor clinically     SLP LONG TERM GOAL #3   Title  Pt will utitlize multimodal communication/compensations for anomia in 12 minutes mod complex conversation over 3 sessions with occasional min A    Baseline  05/08/11, 05-13-19    Time  2    Period  Weeks    Status  On-going      SLP LONG TERM GOAL #4   Title  Pt will demo functional divided attention for 15 minutes x2 sessions, >95% accuracy on tasks with double checking allowed, x2 sessions.    Time  2    Period  Weeks    Status  On-going      SLP LONG TERM GOAL #5   Title  Patient will compose written text messages and/or brief email responses using compensations (speech-to-text).    Time  2    Period  Weeks    Status  On-going       Plan - 05/13/19 1103    Clinical Impression Statement  Mr. Junkin cont to present with mild anomic aphasia, mild dysarthria and oropharyngeal dysphagia. I would classify his deficit as mild; 15 minutes mod complex conversation was functional today as pt used compensations and extended time.Error awareness was good-excellent. SLP told pt he appears funcitonal at this time but would like pt to become more fluid before we d/c. Pt agreed. I recommend cont'd  skilled ST to address communication and swallowing impairments, in order to maximize pt's independence and safety, increase his ability to communicate wants and needs, and improve quality of life.    Speech Therapy Frequency  2x / week    Duration  --   8 weeks/17 visits   Treatment/Interventions  [place]iration precaution training;Diet toleration management by SLP;Environmental controls;Trials of upgraded texture/liquids;Cognitive reorganization;Oral motor exercises;Multimodal communcation approach;Compensatory strategies;Pharyngeal strengthening exercises;Language facilitation;Compensatory techniques;Cueing hierarchy;Internal/external aids;Functional tasks;SLP instruction and feedback;Patient/family education    Potential to Achieve Goals  Good    Consulted and Agree with Plan of Care  Patient       Patient will benefit from skilled therapeutic intervention in order to improve the following deficits and impairments:   Aphasia  Cognitive communication deficit  Dysphagia, oropharyngeal phase    Problem List Patient Active Problem List   Diagnosis Date Noted  . Hypokalemia 03/12/2019  . Expressive aphasia 03/10/2019  . Dysphagia 03/10/2019  . Middle cerebral artery embolism, left 03/09/2019  . Diverticular stricture ([place]) 03/07/2019  . Bacteria in urine   . Chronic atrial fibrillation ([place]) 12/02/2018  . Current use of long term anticoagulation 12/02/2018  . UTI (urinary tract infection) 11/29/2018  . Hyponatremia 11/29/2018  . PAF (paroxysmal atrial fibrillation) ([place]) 11/29/2018  . Diverticulitis of large intestine with abscess 09/23/2018  . Dyslipidemia 12/05/2017  . CAD (coronary artery disease), native coronary artery 12/06/2015  . SOB (shortness of breath)   . NICM (nonischemic cardiomyopathy) ([place])   . Thyroid activity decreased   . Hypothyroidism 08/12/2013  . History of radiation therapy 05/30/2012  . Smokes tobacco daily 05/30/2012  . Cancer of tonsillar fossa (  [place])  11/27/2011  . Tonsil cancer ([place]) 2005  . History of chemotherapy 2005    [facility] ,[place], [place]  05/13/2019, 11:06 AM  [place] [address] [place], [place], [address] Phone: [phone]   Fax:  [phone]   Name: Adrian [name] Adrian [name] Date of Birth: November 09, 1955

## 2019-05-14 ENCOUNTER — Telehealth: Payer: Self-pay | Admitting: Neurology

## 2019-05-14 ENCOUNTER — Other Ambulatory Visit: Payer: Self-pay | Admitting: Cardiology

## 2019-05-14 ENCOUNTER — Ambulatory Visit: Payer: BC Managed Care – PPO | Admitting: Physical Therapy

## 2019-05-14 ENCOUNTER — Telehealth: Payer: Self-pay

## 2019-05-14 ENCOUNTER — Encounter: Payer: Self-pay | Admitting: Physical Therapy

## 2019-05-14 DIAGNOSIS — R4701 Aphasia: Secondary | ICD-10-CM | POA: Diagnosis not present

## 2019-05-14 DIAGNOSIS — M6281 Muscle weakness (generalized): Secondary | ICD-10-CM

## 2019-05-14 DIAGNOSIS — R2681 Unsteadiness on feet: Secondary | ICD-10-CM

## 2019-05-14 NOTE — Therapy (Signed)
[place], [place], [address] Phone: [phone]   Fax:  [phone]  Physical Therapy Treatment  Patient Details  Name: Adrian [name] MRN: [id number] Date of Birth: February 10, 1956 Referring Provider (PT): [name], MD (PCP) referred to therapy by NP at hospital   Encounter Date: 05/14/2019  PT End of Session - 05/14/19 1010    Visit Number  11    Date for PT Re-Evaluation  05/11/19    Authorization Type  BCBS    PT Start Time  0930    PT Stop Time  1011    PT Time Calculation (min)  41 min    Activity Tolerance  Patient tolerated treatment well    Behavior During Therapy  [facility] for tasks assessed/performed       Past Medical History:  Diagnosis Date  . AF (paroxysmal atrial fibrillation) ([place]) 11/29/2018  . Atrial flutter ([place]) 10/12/2015  . CAD (coronary artery disease), native coronary artery 12/06/2015   a. 10/2015 MV: EF  37%, reversible defect inferior apex, intermediate risk findings; b. 10/2015 Cath: 20% mid RCA.  . Colonic diverticular abscess   . Diverticulitis   . History of chemotherapy 2005   Cisplatin  . Hypothyroidism   . NICM (nonischemic cardiomyopathy) ([place]) 10/14/2015   a. Tachy mediated?;  b. Echo 3/17 - Mild concentric LVH, EF 30-35%, anteroseptal, anterior, anterolateral, apical anterior, lateral hypokinesis, trivial MR, mild to moderately reduced RVSF; c. LHC 3/17 - mRCA 20%  . Paroxysmal atrial flutter ([place])    a. TEE 3/17 with ? LAA clot-->s/p TEE/DCCV 11/24/2015  . Radiation NOv.3,2005-Dec. 15, 2005   6810 cGy in 30 fractions  . Tonsil cancer [facility]) 2005   Dr Romeo Rabon.  XRT    Past Surgical History:  Procedure Laterality Date  . APPENDECTOMY N/A 03/07/2019   Procedure: ROBOTIC ASSISTED APPENDECTOMY;  Surgeon: Michael Boston, MD;  [facility];  Service: General;  Laterality: N/A;  . CARDIAC CATHETERIZATION N/A 10/18/2015   Procedure: Left Heart Cath and Coronary  Angiography;  Surgeon: Burnell Blanks, MD;  Location: [place] CV LAB;  Service: Cardiovascular;  Laterality: N/A;  . CARDIOVERSION N/A 11/24/2015   Procedure: CARDIOVERSION;  Surgeon: Josue Hector, MD;  Location: [place];  Service: Cardiovascular;  Laterality: N/A;  . CYSTOSCOPY WITH STENT PLACEMENT Bilateral 03/07/2019   Procedure: CYSTOSCOPY WITH BILATERAL FIREFLY INJECTION;  Surgeon: Ardis Hughs, MD;  [facility];  Service: Urology;  Laterality: Bilateral;  . GASTROSTOMY TUBE PLACEMENT  07/04/2004   IR - G tube for tonsilar cancer  . IR ANGIO INTRA EXTRACRAN SEL COM CAROTID INNOMINATE UNI R MOD SED  03/09/2019  . IR ANGIO VERTEBRAL SEL VERTEBRAL UNI R MOD SED  03/09/2019  . IR CT HEAD LTD  03/09/2019  . IR PERCUTANEOUS ART THROMBECTOMY/INFUSION INTRACRANIAL INC DIAG ANGIO  03/09/2019  . IR RADIOLOGIST EVAL & MGMT  12/18/2018  . LAMINECTOMY     C5/placement of steel plate  . NECK SURGERY  2003   replaced disk  . RADIOLOGY WITH ANESTHESIA N/A 03/08/2019   Procedure: IR WITH ANESTHESIA;  Surgeon: Luanne Bras, MD;  Location: [place];  Service: Radiology;  Laterality: N/A;  . TEE WITHOUT CARDIOVERSION N/A 10/15/2015   Procedure: TRANSESOPHAGEAL ECHOCARDIOGRAM (TEE);  Surgeon: Larey Dresser, MD;  Location: [place];  Service: Cardiovascular;  Laterality: N/A;  . TEE WITHOUT CARDIOVERSION N/A 11/24/2015   Procedure: TRANSESOPHAGEAL ECHOCARDIOGRAM (TEE);  Surgeon: Collier Salina  Tommy Rainwater, MD;  Location: [place] ENDOSCOPY;  Service: Cardiovascular;  Laterality: N/A;  . XI ROBOTIC ASSISTED COLOSTOMY TAKEDOWN N/A 03/07/2019   Procedure: XI ROBOTIC ASSISTED LOW ANTERIOR RESECTION, RIGID PROCTOSCOPY;  Surgeon: Michael Boston, MD;  [facility];  Service: General;  Laterality: N/A;    There were no vitals filed for this visit.  Subjective Assessment - 05/14/19 0932    Subjective  "Im all right"    Pertinent History  Pt and wife are separated; pt staying with wife right now; tonsil  cancer with radiation and chemotherapy in 2005, cardiomyopathy, atrial flutter, hypothyroidism, diverticulitis with abcess, CAD, and C5 laminectomy    Currently in Pain?  No/denies                       [facility] Adult PT Treatment/Exercise - 05/14/19 0001      Lumbar Exercises: Standing   Other Standing Lumbar Exercises  Floor to mat table lbox lifts 5lb 2x5     Other Standing Lumbar Exercises  Low squats to OHP blue ball 2x15       Lumbar Exercises: Seated   Other Seated Lumbar Exercises  Ball pick up with overhead throw for core contraction 2x10       Lumbar Exercises: Prone   Other Prone Lumbar Exercises  Plank to push up 2x10       Knee/Hip Exercises: Aerobic   Elliptical  I11 R 7 x 4 min       Knee/Hip Exercises: Machines for Strengthening   Cybex Leg Press  60# 3x10, single legs 20# 2x10; Heel raises 60lb 2x10     Other Machine  45# seated rows 2x15 , lats 2x10, 35# chest press 2x15       Knee/Hip Exercises: Plyometrics   Bilateral Jumping  1 set;5 reps   mat table     Knee/Hip Exercises: Standing   Forward Step Up  Both;2 sets;10 reps;Hand Hold: 0   on mat table , first set with 5lb dumbbell                  PT Long Term Goals - 05/07/19 1103      PT LONG TERM GOAL #1   Title  Pt will demonstrate independence with balance and strengthening HEP      PT LONG TERM GOAL #2   Title  Pt will improve FGA to 30/30    Status  Achieved            Plan - 05/14/19 1011    Clinical Impression Statement  Overall pt did really well with today's interventions.He tolerated increase reps with seated rows although he had some fatigue. Postural cues provided with seated lat pull downs. Progressed to some bilat box jumps on mat table. Pt was able to progress with plank to pushup using only 3 points of contact in the push up position.    Comorbidities  tonsil cancer with radiation and chemotherapy in 2005, cardiomyopathy, atrial flutter, hypothyroidism,  diverticulitis with abcess, CAD, and C5 laminectomy    Examination-Activity Limitations  Carry;Lift;Reach Overhead;Squat     Continue to progress with work related activities  Patient will benefit from skilled therapeutic intervention in order to improve the following deficits and impairments:     Visit Diagnosis: Muscle weakness (generalized)  Unsteadiness on feet     Problem List Patient Active Problem List   Diagnosis Date Noted  . Hypokalemia 03/12/2019  . Expressive aphasia 03/10/2019  . Dysphagia 03/10/2019  .  Middle cerebral artery embolism, left 03/09/2019  . Diverticular stricture ([place]) 03/07/2019  . Bacteria in urine   . Chronic atrial fibrillation ([place]) 12/02/2018  . Current use of long term anticoagulation 12/02/2018  . UTI (urinary tract infection) 11/29/2018  . Hyponatremia 11/29/2018  . PAF (paroxysmal atrial fibrillation) ([place]) 11/29/2018  . Diverticulitis of large intestine with abscess 09/23/2018  . Dyslipidemia 12/05/2017  . CAD (coronary artery disease), native coronary artery 12/06/2015  . SOB (shortness of breath)   . NICM (nonischemic cardiomyopathy) ([place])   . Thyroid activity decreased   . Hypothyroidism 08/12/2013  . History of radiation therapy 05/30/2012  . Smokes tobacco daily 05/30/2012  . Cancer of tonsillar fossa ([place]) 11/27/2011  . Tonsil cancer ([place]) 2005  . History of chemotherapy 2005    Scot Jun, PTA 05/14/2019, 10:16 AM  [place], [place], [address] Phone: [phone]   Fax:  [phone]  Name: TAITON ARCIERO MRN: [id number] Date of Birth: 1955/10/29

## 2019-05-14 NOTE — Telephone Encounter (Signed)
Pt's wife called stating that his employer states the pt has to be released 100% for him to come back to work. They would like to know if the PT,OT and Speech therapist can all do letters stating that the pt is good to go and if a new letter can be sent in to the employer. Please advise.

## 2019-05-15 ENCOUNTER — Ambulatory Visit: Payer: BC Managed Care – PPO | Admitting: Speech Pathology

## 2019-05-15 ENCOUNTER — Other Ambulatory Visit: Payer: Self-pay

## 2019-05-15 DIAGNOSIS — R4701 Aphasia: Secondary | ICD-10-CM

## 2019-05-15 NOTE — Telephone Encounter (Signed)
Agree with plan 

## 2019-05-15 NOTE — Telephone Encounter (Signed)
I called pts wife to state letter is ready for pick up. She stated her husband will come by next week and get it.

## 2019-05-15 NOTE — Telephone Encounter (Signed)
Made in error

## 2019-05-15 NOTE — Telephone Encounter (Signed)
I called pts wife Archie Patten to discuss letter. She stated pts letter needs to state he has been cleared for work and can be at 100 percent to work. I gave fax number of [phone]. She will fax what needs to be in the letter.PT is schedule to return to work on 05/25/2019. He has not work since March 2020.

## 2019-05-15 NOTE — Telephone Encounter (Signed)
Per Dr.Sethi letter can be done at 100 percent return to work for patient.

## 2019-05-15 NOTE — Therapy (Signed)
Adrian [name] [address] [place], [place], [address] Phone: [phone]   Fax:  [phone]  Speech Language Pathology Treatment  Patient Details  Name: Adrian [name] MRN: [id number] Date of Birth: 16-Feb-1956 Referring Provider (SLP): [name], MD (PCP) referred to therapy by NP at hospital   Encounter Date: 05/15/2019  End of Session - 05/15/19 1345    Visit Number  13    Number of Visits  17    Date for SLP Re-Evaluation  05/24/19    Authorization Type  BCBS    SLP Start Time  1018    SLP Stop Time   1100    SLP Time Calculation (min)  42 min    Activity Tolerance  Patient tolerated treatment well       Past Medical History:  Diagnosis Date  . AF (paroxysmal atrial fibrillation) ([place]) 11/29/2018  . Atrial flutter ([place]) 10/12/2015  . CAD (coronary artery disease), native coronary artery 12/06/2015   a. 10/2015 MV: EF  37%, reversible defect inferior apex, intermediate risk findings; b. 10/2015 Cath: 20% mid RCA.  . Colonic diverticular abscess   . Diverticulitis   . History of chemotherapy 2005   Cisplatin  . Hypothyroidism   . NICM (nonischemic cardiomyopathy) ([place]) 10/14/2015   a. Tachy mediated?;  b. Echo 3/17 - Mild concentric LVH, EF 30-35%, anteroseptal, anterior, anterolateral, apical anterior, lateral hypokinesis, trivial MR, mild to moderately reduced RVSF; c. LHC 3/17 - mRCA 20%  . Paroxysmal atrial flutter ([place])    a. TEE 3/17 with ? LAA clot-->s/p TEE/DCCV 11/24/2015  . Radiation NOv.3,2005-Dec. 15, 2005   6810 cGy in 30 fractions  . Tonsil cancer [facility]) 2005   Dr Romeo Rabon.  XRT    Past Surgical History:  Procedure Laterality Date  . APPENDECTOMY N/A 03/07/2019   Procedure: ROBOTIC ASSISTED APPENDECTOMY;  Surgeon: Michael Boston, MD;  [facility];  Service: General;  Laterality: N/A;  . CARDIAC CATHETERIZATION N/A 10/18/2015   Procedure: Left Heart Cath and Coronary Angiography;  Surgeon:  Burnell Blanks, MD;  Location: [place] CV LAB;  Service: Cardiovascular;  Laterality: N/A;  . CARDIOVERSION N/A 11/24/2015   Procedure: CARDIOVERSION;  Surgeon: Josue Hector, MD;  Location: [place];  Service: Cardiovascular;  Laterality: N/A;  . CYSTOSCOPY WITH STENT PLACEMENT Bilateral 03/07/2019   Procedure: CYSTOSCOPY WITH BILATERAL FIREFLY INJECTION;  Surgeon: Ardis Hughs, MD;  [facility];  Service: Urology;  Laterality: Bilateral;  . GASTROSTOMY TUBE PLACEMENT  07/04/2004   IR - G tube for tonsilar cancer  . IR ANGIO INTRA EXTRACRAN SEL COM CAROTID INNOMINATE UNI R MOD SED  03/09/2019  . IR ANGIO VERTEBRAL SEL VERTEBRAL UNI R MOD SED  03/09/2019  . IR CT HEAD LTD  03/09/2019  . IR PERCUTANEOUS ART THROMBECTOMY/INFUSION INTRACRANIAL INC DIAG ANGIO  03/09/2019  . IR RADIOLOGIST EVAL & MGMT  12/18/2018  . LAMINECTOMY     C5/placement of steel plate  . NECK SURGERY  2003   replaced disk  . RADIOLOGY WITH ANESTHESIA N/A 03/08/2019   Procedure: IR WITH ANESTHESIA;  Surgeon: Luanne Bras, MD;  Location: [place];  Service: Radiology;  Laterality: N/A;  . TEE WITHOUT CARDIOVERSION N/A 10/15/2015   Procedure: TRANSESOPHAGEAL ECHOCARDIOGRAM (TEE);  Surgeon: Larey Dresser, MD;  Location: [place];  Service: Cardiovascular;  Laterality: N/A;  . TEE WITHOUT CARDIOVERSION N/A 11/24/2015   Procedure: TRANSESOPHAGEAL ECHOCARDIOGRAM (TEE);  Surgeon: Josue Hector, MD;  [facility];  Service: Cardiovascular;  Laterality: N/A;  . XI ROBOTIC ASSISTED COLOSTOMY TAKEDOWN N/A 03/07/2019   Procedure: XI ROBOTIC ASSISTED LOW ANTERIOR RESECTION, RIGID PROCTOSCOPY;  Surgeon: Michael Boston, MD;  [facility];  Service: General;  Laterality: N/A;    There were no vitals filed for this visit.         ADULT SLP TREATMENT - 05/15/19 1342      General Information   Behavior/Cognition  Alert;Cooperative;Pleasant mood      Treatment Provided   Treatment provided   Cognitive-Linquistic      Cognitive-Linquistic Treatment   Treatment focused on  Aphasia;Patient/family/caregiver education    Skilled Treatment  Patient left phone in the car today. SLP reviewed pt's homework (generative naming) and praised pt for attempts at error correction (in some cases he had rewritten words 3-5 times until getting the correct spelling. In list of motorcycle types, pt aware of 75% of written errors. SLP demo'd using speech-to-text (on SLP's phone) to retrieve appropriate spelling. In mod complex to complex conversation, pt's language was functional with extra time required rarely. SLP and pt discussed his progress in therapy and remaining deficits. Patient has 2 remaining visits scheduled; he is unsure at this time if he wishes to continue therapy beyond scheduled visits. Pt verbalizes frustration with writing and that he would most like to be able to send text messages and spell correctly so that he can perform internet searches. SLP encouraged pt to bring his phone to remaining sessions so that he can practice using compensations (speech to text) to assist with this.       Assessment / Recommendations / Plan   Plan  Continue with current plan of care;Goals updated       SLP Education - 05/15/19 1344    Education Details  speech-to-text to aid internet searches and texting       SLP Short Term Goals - 05/13/19 1105      SLP SHORT TERM GOAL #1   Title  Pt will demo HEP for dysphagia with rare min A x 3 sessions.    Time  1    Period  Weeks    Status  Partially Met      SLP SHORT TERM GOAL #2   Title  Pt will complete further assessment of cognition and writing and goals added as appropriate, within first 4 visits.    Status  Achieved      SLP SHORT TERM GOAL #3   Title  Pt will utitlize multimodal communication/compensations for anomia in 8 minutes simple-mod complex conversation over 3 sessions with occasional min A    Baseline  04/15/19 04/17/19 04/24/19    Time  1     Period  Weeks    Status  Achieved      SLP SHORT TERM GOAL #4   Title  Pt will alternate attention between 2 functional tasks for 12 minutes >90% accuracy x 2 sessions with compensations allowed.    Baseline  04/17/19    Time  4    Period  Weeks    Status  Partially Met       SLP Long Term Goals - 05/15/19 1044      SLP LONG TERM GOAL #1   Title  Pt will complete HEP for dysphagia with modified independence x 3 sessions.    Time  2    Period  Weeks    Status  Deferred   pt wishes to focus on language  expression at this time     SLP LONG TERM GOAL #2   Title  Pt will participate in repeat MBS for objective assessment of swallow function to determine readiness for diet advancement.    Status  Deferred   on hold as pt is electing to take thin liquids at this time; will continue to monitor clinically     SLP LONG TERM GOAL #3   Title  Pt will utitlize multimodal communication/compensations for anomia in 12 minutes mod complex conversation over 3 sessions with occasional min A    Baseline  05/08/11, 05-13-19, 05/15/19    Time  2    Period  Weeks    Status  On-going      SLP LONG TERM GOAL #4   Title  Pt will demo functional divided attention for 15 minutes x2 sessions, >95% accuracy on tasks with double checking allowed, x2 sessions.    Time  2    Period  Weeks    Status  On-going      SLP LONG TERM GOAL #5   Title  Patient will compose written text messages and/or brief email responses using compensations (speech-to-text).    Time  2    Period  Weeks    Status  On-going       Plan - 05/15/19 1345    Clinical Impression Statement  Mr. Mishkin cont to present with mild anomic aphasia, mild dysarthria and oropharyngeal dysphagia. I would classify his deficit as mild; 15 minutes mod complex conversation was functional today as pt used compensations and extended time.E rror awareness was good-excellent. SLP told pt he appears functional at this time but would like pt to become  more fluid before we d/c. Pt agreed. I recommend cont'd skilled ST to address communication and swallowing impairments, in order to maximize pt's independence and safety, increase his ability to communicate wants and needs, and improve quality of life.    Speech Therapy Frequency  2x / week    Duration  --   8 weeks/17 visits   Treatment/Interventions  Aspiration precaution training;Diet toleration management by SLP;Environmental controls;Trials of upgraded texture/liquids;Cognitive reorganization;Oral motor exercises;Multimodal communcation approach;Compensatory strategies;Pharyngeal strengthening exercises;Language facilitation;Compensatory techniques;Cueing hierarchy;Internal/external aids;Functional tasks;SLP instruction and feedback;Patient/family education    Potential to Achieve Goals  Good    Consulted and Agree with Plan of Care  Patient       Patient will benefit from skilled therapeutic intervention in order to improve the following deficits and impairments:   Aphasia    Problem List Patient Active Problem List   Diagnosis Date Noted  . Hypokalemia 03/12/2019  . Expressive aphasia 03/10/2019  . Dysphagia 03/10/2019  . Middle cerebral artery embolism, left 03/09/2019  . Diverticular stricture ([place]) 03/07/2019  . Bacteria in urine   . Chronic atrial fibrillation ([place]) 12/02/2018  . Current use of long term anticoagulation 12/02/2018  . UTI (urinary tract infection) 11/29/2018  . Hyponatremia 11/29/2018  . PAF (paroxysmal atrial fibrillation) ([place]) 11/29/2018  . Diverticulitis of large intestine with abscess 09/23/2018  . Dyslipidemia 12/05/2017  . CAD (coronary artery disease), native coronary artery 12/06/2015  . SOB (shortness of breath)   . NICM (nonischemic cardiomyopathy) ([place])   . Thyroid activity decreased   . Hypothyroidism 08/12/2013  . History of radiation therapy 05/30/2012  . Smokes tobacco daily 05/30/2012  . Cancer of tonsillar fossa ([place]) 11/27/2011  .  Tonsil cancer ([place]) 2005  . History of chemotherapy 2005   Deneise Lever, MS, [employer]  Pathologist  Aliene Altes 05/15/2019, 1:47 PM  [place] [address] [place], [place], [address] Phone: [phone]   Fax:  [phone]   Name: ADAMA FERBER MRN: [id number] Date of Birth: 09/27/1955

## 2019-05-16 ENCOUNTER — Ambulatory Visit: Payer: BC Managed Care – PPO | Admitting: Physical Therapy

## 2019-05-16 ENCOUNTER — Encounter: Payer: Self-pay | Admitting: Physical Therapy

## 2019-05-16 DIAGNOSIS — R2681 Unsteadiness on feet: Secondary | ICD-10-CM

## 2019-05-16 DIAGNOSIS — R4701 Aphasia: Secondary | ICD-10-CM | POA: Diagnosis not present

## 2019-05-16 DIAGNOSIS — M6281 Muscle weakness (generalized): Secondary | ICD-10-CM

## 2019-05-16 NOTE — Therapy (Signed)
[place], [place], [address] Phone: [phone]   Fax:  [phone]  Physical Therapy Treatment  Patient Details  Name: Adrian [name] MRN: [id number] Date of Birth: 1955/12/05 Referring Provider (PT): [name], MD (PCP) referred to therapy by NP at hospital   Encounter Date: 05/16/2019  PT End of Session - 05/16/19 1004    Visit Number  12    Date for PT Re-Evaluation  05/11/19    PT Start Time  0930    PT Stop Time  1010    PT Time Calculation (min)  40 min       Past Medical History:  Diagnosis Date  . AF (paroxysmal atrial fibrillation) ([place]) 11/29/2018  . Atrial flutter ([place]) 10/12/2015  . CAD (coronary artery disease), native coronary artery 12/06/2015   a. 10/2015 MV: EF  37%, reversible defect inferior apex, intermediate risk findings; b. 10/2015 Cath: 20% mid RCA.  . Colonic diverticular abscess   . Diverticulitis   . History of chemotherapy 2005   Cisplatin  . Hypothyroidism   . NICM (nonischemic cardiomyopathy) ([place]) 10/14/2015   a. Tachy mediated?;  b. Echo 3/17 - Mild concentric LVH, EF 30-35%, anteroseptal, anterior, anterolateral, apical anterior, lateral hypokinesis, trivial MR, mild to moderately reduced RVSF; c. LHC 3/17 - mRCA 20%  . Paroxysmal atrial flutter ([place])    a. TEE 3/17 with ? LAA clot-->s/p TEE/DCCV 11/24/2015  . Radiation NOv.3,2005-Dec. 15, 2005   6810 cGy in 30 fractions  . Tonsil cancer [facility]) 2005   Dr Romeo Rabon.  XRT    Past Surgical History:  Procedure Laterality Date  . APPENDECTOMY N/A 03/07/2019   Procedure: ROBOTIC ASSISTED APPENDECTOMY;  Surgeon: Michael Boston, MD;  [facility];  Service: General;  Laterality: N/A;  . CARDIAC CATHETERIZATION N/A 10/18/2015   Procedure: Left Heart Cath and Coronary Angiography;  Surgeon: Burnell Blanks, MD;  Location: [facility];  Service: Cardiovascular;  Laterality: N/A;  . CARDIOVERSION N/A  11/24/2015   Procedure: CARDIOVERSION;  Surgeon: Josue Hector, MD;  Location: [place];  Service: Cardiovascular;  Laterality: N/A;  . CYSTOSCOPY WITH STENT PLACEMENT Bilateral 03/07/2019   Procedure: CYSTOSCOPY WITH BILATERAL FIREFLY INJECTION;  Surgeon: Ardis Hughs, MD;  [facility];  Service: Urology;  Laterality: Bilateral;  . GASTROSTOMY TUBE PLACEMENT  07/04/2004   IR - G tube for tonsilar cancer  . IR ANGIO INTRA EXTRACRAN SEL COM CAROTID INNOMINATE UNI R MOD SED  03/09/2019  . IR ANGIO VERTEBRAL SEL VERTEBRAL UNI R MOD SED  03/09/2019  . IR CT HEAD LTD  03/09/2019  . IR PERCUTANEOUS ART THROMBECTOMY/INFUSION INTRACRANIAL INC DIAG ANGIO  03/09/2019  . IR RADIOLOGIST EVAL & MGMT  12/18/2018  . LAMINECTOMY     C5/placement of steel plate  . NECK SURGERY  2003   replaced disk  . RADIOLOGY WITH ANESTHESIA N/A 03/08/2019   Procedure: IR WITH ANESTHESIA;  Surgeon: Luanne Bras, MD;  Location: [place];  Service: Radiology;  Laterality: N/A;  . TEE WITHOUT CARDIOVERSION N/A 10/15/2015   Procedure: TRANSESOPHAGEAL ECHOCARDIOGRAM (TEE);  Surgeon: Larey Dresser, MD;  Location: [place];  Service: Cardiovascular;  Laterality: N/A;  . TEE WITHOUT CARDIOVERSION N/A 11/24/2015   Procedure: TRANSESOPHAGEAL ECHOCARDIOGRAM (TEE);  Surgeon: Josue Hector, MD;  Location: [place];  Service: Cardiovascular;  Laterality: N/A;  . XI ROBOTIC ASSISTED COLOSTOMY TAKEDOWN N/A 03/07/2019   Procedure: XI ROBOTIC ASSISTED  LOW ANTERIOR RESECTION, RIGID PROCTOSCOPY;  Surgeon: Michael Boston, MD;  [facility];  Service: General;  Laterality: N/A;    There were no vitals filed for this visit.  Subjective Assessment - 05/16/19 0934    Subjective  "Good"    Currently in Pain?  No/denies                       [facility] Adult PT Treatment/Exercise - 05/16/19 0001      Ambulation/Gait   Gait Comments  Hall way jogging.      Lumbar Exercises: Standing   Other Standing Lumbar  Exercises  Squat and row 45lb 2x10, Bottom loaded squats 42.5 2x10     Other Standing Lumbar Exercises  Unilateral farmes carry blue ball wiht one flight of stairs with ball in each hand       Knee/Hip Exercises: Aerobic   Elliptical  I13 R 7 x 4 min       Knee/Hip Exercises: Machines for Strengthening   Cybex Knee Extension  15# 2x10    Cybex Knee Flexion  35# 2x15    Other Machine  55# seated rows 2x10 , 45lb Lats 2x10, 35# chest press 2x15                   PT Long Term Goals - 05/07/19 1103      PT LONG TERM GOAL #1   Title  Pt will demonstrate independence with balance and strengthening HEP      PT LONG TERM GOAL #2   Title  Pt will improve FGA to 30/30    Status  Achieved            Plan - 05/16/19 1004    Clinical Impression Statement  Pt tolerated today's treatment well evident buy no subjective reports of increase pain. Some struggle with the bottom loaded squats in preparation for him to return to work. Increase resistance tolerated with seated rows and lats. Some fatigue with farmers carry and hallway jogging.    Personal Factors and Comorbidities  Comorbidity 3+;Profession    Comorbidities  tonsil cancer with radiation and chemotherapy in 2005, cardiomyopathy, atrial flutter, hypothyroidism, diverticulitis with abcess, CAD, and C5 laminectomy    Examination-Activity Limitations  Carry;Lift;Reach Overhead;Squat    Examination-Participation Restrictions  Driving;Other    Stability/Clinical Decision Making  Evolving/Moderate complexity    Rehab Potential  Good    PT Frequency  1x / week    PT Duration  6 weeks    PT Treatment/Interventions  ADLs/Self Care Home Management;Gait training;Stair training;Functional mobility training;Therapeutic activities;Therapeutic exercise;Balance training;Neuromuscular re-education;Patient/family education;Other (comment)    PT Next Visit Plan  continue to see him 2x a week to prepare him for his job.       Patient will  benefit from skilled therapeutic intervention in order to improve the following deficits and impairments:  Decreased balance, Decreased endurance, Difficulty walking, Decreased strength  Visit Diagnosis: Muscle weakness (generalized)  Unsteadiness on feet     Problem List Patient Active Problem List   Diagnosis Date Noted  . Hypokalemia 03/12/2019  . Expressive aphasia 03/10/2019  . Dysphagia 03/10/2019  . Middle cerebral artery embolism, left 03/09/2019  . Diverticular stricture ([place]) 03/07/2019  . Bacteria in urine   . Chronic atrial fibrillation ([place]) 12/02/2018  . Current use of long term anticoagulation 12/02/2018  . UTI (urinary tract infection) 11/29/2018  . Hyponatremia 11/29/2018  . PAF (paroxysmal atrial fibrillation) ([place]) 11/29/2018  . Diverticulitis  of large intestine with abscess 09/23/2018  . Dyslipidemia 12/05/2017  . CAD (coronary artery disease), native coronary artery 12/06/2015  . SOB (shortness of breath)   . NICM (nonischemic cardiomyopathy) ([place])   . Thyroid activity decreased   . Hypothyroidism 08/12/2013  . History of radiation therapy 05/30/2012  . Smokes tobacco daily 05/30/2012  . Cancer of tonsillar fossa ([place]) 11/27/2011  . Tonsil cancer ([place]) 2005  . History of chemotherapy 2005    Scot Jun, PTA 05/16/2019, 10:09 AM  [place], [place], [address] Phone: [phone]   Fax:  [phone]  Name: Adrian [name] MRN: [id number] Date of Birth: 1955/09/02

## 2019-05-19 NOTE — Telephone Encounter (Signed)
Pt came Into office today and pick up work letter to return to work on 05/25/2019 at a 100 percent.

## 2019-05-19 NOTE — Progress Notes (Signed)
Cardiology Office Note:    Date:  05/20/2019   ID:  Adrian [name], DOB 11/26/1955, MRN [id number]  PCP:  [name], MD  Cardiologist:  Fransico Him, MD    Referring MD: [name], MD   Chief Complaint  Patient presents with  . Coronary Artery Disease  . Atrial Fibrillation  . Hyperlipidemia  . Cardiomyopathy    History of Present Illness:    Adrian [name] is a 63 y.o. male with a hx of atrial flutter in the setting of acute diverticulitis, nonischemic cardiomyopathy LVEF 30 to 35%, nonobstructive CAD on cath in 2017, PAF on Eliquis for CHA2DS2-VASc of 2.  Follow-up echo 02/2016 normal LVEF 55 to 60%.  Most recently he underwent robotic resection for diverticulitis 03/07/2019.  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  He had expressive aphasia but no focal weakness otherwise. LICA possible chronic occlusion not amenable to stent, no thrombectomy.  He failed swallowing evaluation.  He then developed pseudoaneurysm the right groin treated with thrombin injection.  Neurology felt it was safe to transition back to oral anticoagulation with aggressive outpatient rehab.  2D echo 03/09/2019 normal LVEF 60 to 65%.  He is here today for followup and is doing well.  He denies any chest pain or pressure, SOB, DOE, PND, orthopnea, LE edema, dizziness, palpitations or syncope. He is compliant with his meds and is tolerating meds with no SE.    Past Medical History:  Diagnosis Date  . AF (paroxysmal atrial fibrillation) ([place]) 11/29/2018  . Atrial flutter ([place]) 10/12/2015  . CAD (coronary artery disease), native coronary artery 12/06/2015   a. 10/2015 MV: EF  37%, reversible defect inferior apex, intermediate risk findings; b. 10/2015 Cath: 20% mid RCA.  . Colonic diverticular abscess   . Diverticulitis   . History of chemotherapy 2005   Cisplatin  . Hypothyroidism   . NICM (nonischemic cardiomyopathy) ([place]) 10/14/2015   a. Tachy mediated?;  b. Echo 3/17 - Mild  concentric LVH, EF 30-35%, anteroseptal, anterior, anterolateral, apical anterior, lateral hypokinesis, trivial MR, mild to moderately reduced RVSF; c. LHC 3/17 - mRCA 20%  . Paroxysmal atrial flutter ([place])    a. TEE 3/17 with ? LAA clot-->s/p TEE/DCCV 11/24/2015  . Radiation NOv.3,2005-Dec. 15, 2005   6810 cGy in 30 fractions  . 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  XRT    Past Surgical History:  Procedure Laterality Date  . APPENDECTOMY N/A 03/07/2019   Procedure: ROBOTIC ASSISTED APPENDECTOMY;  Surgeon: Michael Boston, MD;  [facility];  Service: General;  Laterality: N/A;  . CARDIAC CATHETERIZATION N/A 10/18/2015   Procedure: Left Heart Cath and Coronary Angiography;  Surgeon: Burnell Blanks, MD;  Location: [place] [facility];  Service: Cardiovascular;  Laterality: N/A;  . CARDIOVERSION N/A 11/24/2015   Procedure: CARDIOVERSION;  Surgeon: Josue Hector, MD;  Location: [place];  Service: Cardiovascular;  Laterality: N/A;  . CYSTOSCOPY WITH STENT PLACEMENT Bilateral 03/07/2019   Procedure: CYSTOSCOPY WITH BILATERAL FIREFLY INJECTION;  Surgeon: Ardis Hughs, MD;  [facility];  Service: Urology;  Laterality: Bilateral;  . GASTROSTOMY TUBE PLACEMENT  07/04/2004   IR - G tube for tonsilar cancer  . IR ANGIO INTRA EXTRACRAN SEL COM CAROTID INNOMINATE UNI R MOD SED  03/09/2019  . IR ANGIO VERTEBRAL SEL VERTEBRAL UNI R MOD SED  03/09/2019  . IR CT HEAD LTD  03/09/2019  . IR PERCUTANEOUS ART THROMBECTOMY/INFUSION INTRACRANIAL INC DIAG  ANGIO  03/09/2019  . IR RADIOLOGIST EVAL & MGMT  12/18/2018  . LAMINECTOMY     C5/placement of steel plate  . NECK SURGERY  2003   replaced disk  . RADIOLOGY WITH ANESTHESIA N/A 03/08/2019   Procedure: IR WITH ANESTHESIA;  Surgeon: Luanne Bras, MD;  Location: [place];  Service: Radiology;  Laterality: N/A;  . TEE WITHOUT CARDIOVERSION N/A 10/15/2015   Procedure: TRANSESOPHAGEAL ECHOCARDIOGRAM (TEE);  Surgeon: Larey Dresser, MD;   Location: [place];  Service: Cardiovascular;  Laterality: N/A;  . TEE WITHOUT CARDIOVERSION N/A 11/24/2015   Procedure: TRANSESOPHAGEAL ECHOCARDIOGRAM (TEE);  Surgeon: Josue Hector, MD;  Location: [place];  Service: Cardiovascular;  Laterality: N/A;  . XI ROBOTIC ASSISTED COLOSTOMY TAKEDOWN N/A 03/07/2019   Procedure: XI ROBOTIC ASSISTED LOW ANTERIOR RESECTION, RIGID PROCTOSCOPY;  Surgeon: Michael Boston, MD;  [facility];  Service: General;  Laterality: N/A;    Current Medications: Current Meds  Medication Sig  . apixaban (ELIQUIS) 5 MG TABS tablet Take 1 tablet (5 mg total) by mouth 2 (two) times daily.  Marland Kitchen atorvastatin (LIPITOR) 40 MG tablet Take 1 tablet (40 mg total) by mouth daily.  Mar[place] Kitchen levothyroxine (SYNTHROID) 112 MCG tablet Take 1 tablet (112 mcg total) by mouth daily.     Allergies:   Patient has no known allergies.   Social History   Socioeconomic History  . Marital status: Married    Spouse name: Not on file  . Number of children: Not on file  . Years of education: Not on file  . Highest education level: Not on file  Occupational History  . Not on file  Social Needs  . Financial resource strain: Not on file  . Food insecurity    Worry: Not on file    Inability: Not on file  . Transportation needs    Medical: Not on file    Non-medical: Not on file  Tobacco Use  . Smoking status: Current Every Day Smoker    Packs/day: 0.50    Years: 20.00    Pack years: 10.00    Types: Cigarettes  . Smokeless tobacco: Never Used  . Tobacco comment: 1/2 pack per day  Substance and Sexual Activity  . Alcohol use: Yes    Alcohol/week: 12.0 standard drinks    Types: 12 Cans of beer per week    Comment: couple of beers each day  . Drug use: No  . Sexual activity: Not on file  Lifestyle  . Physical activity    Days per week: Not on file    Minutes per session: Not on file  . Stress: Not on file  Relationships  . Social Herbalist on phone: Not on file     Gets together: Not on file    Attends religious service: Not on file    Active member of club or organization: Not on file    Attends meetings of clubs or organizations: Not on file    Relationship status: Not on file  Other Topics Concern  . Not on file  Social History Narrative  . Not on file     Family History: The patient's family history includes Cancer in his brother; Colon cancer in his father; Prostate cancer in his father; Skin cancer in his mother. There is no history of Esophageal cancer, Rectal cancer, or Stomach cancer.  ROS:   Please see the history of present illness.    ROS  All other systems reviewed and negative.  EKGs/Labs/Other Studies Reviewed:    The following studies were reviewed today: none  EKG:  EKG is not ordered today.    Recent Labs: 11/29/2018: ALT 13 03/14/2019: Magnesium 2.0 03/15/2019: BUN 12; Creatinine, Ser 0.51; Potassium 3.6; Sodium 131 04/04/2019: Hemoglobin 12.6; Platelets 211.0 04/11/2019: TSH 0.33   Recent Lipid Panel    Component Value Date/Time   CHOL 110 03/09/2019 0656   CHOL 159 03/09/2017 1032   TRIG 97 03/09/2019 0656   HDL 45 03/09/2019 0656   HDL 61 03/09/2017 1032   CHOLHDL 2.4 03/09/2019 0656   VLDL 19 03/09/2019 0656   LDLCALC 46 03/09/2019 0656   LDLCALC 66 03/09/2017 1032   LDLDIRECT 143.3 06/20/2012 1148    Physical Exam:    VS:  BP 110/62   Pulse 84   Ht 5\' 8"  (1.727 m)   Wt 125 lb (56.7 kg)   SpO2 99%   BMI 19.01 kg/m     Wt Readings from Last 3 Encounters:  05/20/19 125 lb (56.7 kg)  04/15/19 125 lb (56.7 kg)  03/18/19 129 lb (58.5 kg)     GEN:  Well nourished, well developed in no acute distress HEENT: Normal NECK: No JVD; No carotid bruits LYMPHATICS: No lymphadenopathy CARDIAC: RRR, no murmurs, rubs, gallops RESPIRATORY:  Clear to auscultation without rales, wheezing or rhonchi  ABDOMEN: Soft, non-tender, non-distended MUSCULOSKELETAL:  No edema; No deformity  SKIN: Warm and  dry NEUROLOGIC:  Alert and oriented x 3 PSYCHIATRIC:  Normal affect   ASSESSMENT:    1. PAF (paroxysmal atrial fibrillation) ([place])   2. NICM (nonischemic cardiomyopathy) ([place])   3. Coronary artery disease involving native coronary artery of native heart without angina pectoris   4. Hyperlipidemia, unspecified hyperlipidemia type   5. Tobacco abuse    PLAN:    In order of problems listed above:  1.  PAF -s/p TEE 3/17 with LAA thrombus -s/p DCCV 11/2015 -continue Apixaban 5mg  BID -Creatinine normal at 0.59 on 03/14/2019 and Hb normal at 12.6.  2.  NIDCM -felt tachy related -no significant CAD on cath 2017 -echo with EF 60-65% 03/2019  3.  ASCAD -mild nonobstructive CAD by cath 2017 -no anginal sx -no ASA due to DOAC -continue statin  4.  HLD -LDL goal < 70 -LDL 46 in August 2020 -continue atorvastatin 40mg  daily   Medication Adjustments/Labs and Tests Ordered: Current medicines are reviewed at length with the patient today.  Concerns regarding medicines are outlined above.  No orders of the defined types were placed in this encounter.  No orders of the defined types were placed in this encounter.   Signed, Fransico Him, MD  05/20/2019 9:19 AM    [place]

## 2019-05-20 ENCOUNTER — Ambulatory Visit: Payer: BC Managed Care – PPO

## 2019-05-20 ENCOUNTER — Other Ambulatory Visit: Payer: Self-pay

## 2019-05-20 ENCOUNTER — Ambulatory Visit (INDEPENDENT_AMBULATORY_CARE_PROVIDER_SITE_OTHER): Payer: BC Managed Care – PPO | Admitting: Cardiology

## 2019-05-20 ENCOUNTER — Encounter: Payer: Self-pay | Admitting: Cardiology

## 2019-05-20 VITALS — BP 110/62 | HR 84 | Ht 68.0 in | Wt 125.0 lb

## 2019-05-20 DIAGNOSIS — E785 Hyperlipidemia, unspecified: Secondary | ICD-10-CM | POA: Diagnosis not present

## 2019-05-20 DIAGNOSIS — I428 Other cardiomyopathies: Secondary | ICD-10-CM

## 2019-05-20 DIAGNOSIS — I2510 Atherosclerotic heart disease of native coronary artery without angina pectoris: Secondary | ICD-10-CM | POA: Diagnosis not present

## 2019-05-20 DIAGNOSIS — R4701 Aphasia: Secondary | ICD-10-CM

## 2019-05-20 DIAGNOSIS — R1312 Dysphagia, oropharyngeal phase: Secondary | ICD-10-CM

## 2019-05-20 DIAGNOSIS — Z720 Tobacco use: Secondary | ICD-10-CM

## 2019-05-20 DIAGNOSIS — R41841 Cognitive communication deficit: Secondary | ICD-10-CM

## 2019-05-20 DIAGNOSIS — I480 Paroxysmal atrial fibrillation: Secondary | ICD-10-CM

## 2019-05-20 NOTE — Therapy (Signed)
[facility] [address] [place], [place], [address] Phone: [phone]   Fax:  [phone]  Speech Language Pathology Treatment  Patient Details  Name: Adrian [name] MRN: [id number] Date of Birth: 19-Jan-1956 Referring Provider (SLP): [name], MD (PCP) referred to therapy by NP at hospital   Encounter Date: 05/20/2019  End of Session - 05/20/19 1147    Visit Number  14    Number of Visits  17    Date for SLP Re-Evaluation  05/24/19    SLP Start Time  1103    SLP Stop Time   1140    SLP Time Calculation (min)  37 min    Activity Tolerance  Patient tolerated treatment well       Past Medical History:  Diagnosis Date  . AF (paroxysmal atrial fibrillation) ([place]) 11/29/2018  . Atrial flutter ([place]) 10/12/2015  . CAD (coronary artery disease), native coronary artery 12/06/2015   a. 10/2015 MV: EF  37%, reversible defect inferior apex, intermediate risk findings; b. 10/2015 Cath: 20% mid RCA.  . Colonic diverticular abscess   . Diverticulitis   . History of chemotherapy 2005   Cisplatin  . Hypothyroidism   . NICM (nonischemic cardiomyopathy) ([place]) 10/14/2015   a. Tachy mediated?;  b. Echo 3/17 - Mild concentric LVH, EF 30-35%, anteroseptal, anterior, anterolateral, apical anterior, lateral hypokinesis, trivial MR, mild to moderately reduced RVSF; c. LHC 3/17 - mRCA 20%  . Paroxysmal atrial flutter ([place])    a. TEE 3/17 with ? LAA clot-->s/p TEE/DCCV 11/24/2015  . Radiation NOv.3,2005-Dec. 15, 2005   6810 cGy in 30 fractions  . Tonsil cancer [facility]) 2005   Dr Romeo Rabon.  XRT    Past Surgical History:  Procedure Laterality Date  . APPENDECTOMY N/A 03/07/2019   Procedure: ROBOTIC ASSISTED APPENDECTOMY;  Surgeon: Michael Boston, MD;  [facility];  Service: General;  Laterality: N/A;  . CARDIAC CATHETERIZATION N/A 10/18/2015   Procedure: Left Heart Cath and Coronary Angiography;  Surgeon: Burnell Blanks, MD;   Location: [facility];  Service: Cardiovascular;  Laterality: N/A;  . CARDIOVERSION N/A 11/24/2015   Procedure: CARDIOVERSION;  Surgeon: Josue Hector, MD;  Location: [facility];  Service: Cardiovascular;  Laterality: N/A;  . CYSTOSCOPY WITH STENT PLACEMENT Bilateral 03/07/2019   Procedure: CYSTOSCOPY WITH BILATERAL FIREFLY INJECTION;  Surgeon: Ardis Hughs, MD;  [facility];  Service: Urology;  Laterality: Bilateral;  . GASTROSTOMY TUBE PLACEMENT  07/04/2004   IR - G tube for tonsilar cancer  . IR ANGIO INTRA EXTRACRAN SEL COM CAROTID INNOMINATE UNI R MOD SED  03/09/2019  . IR ANGIO VERTEBRAL SEL VERTEBRAL UNI R MOD SED  03/09/2019  . IR CT HEAD LTD  03/09/2019  . IR PERCUTANEOUS ART THROMBECTOMY/INFUSION INTRACRANIAL INC DIAG ANGIO  03/09/2019  . IR RADIOLOGIST EVAL & MGMT  12/18/2018  . LAMINECTOMY     C5/placement of steel plate  . NECK SURGERY  2003   replaced disk  . RADIOLOGY WITH ANESTHESIA N/A 03/08/2019   Procedure: IR WITH ANESTHESIA;  Surgeon: Luanne Bras, MD;  Location: [facility];  Service: Radiology;  Laterality: N/A;  . TEE WITHOUT CARDIOVERSION N/A 10/15/2015   Procedure: TRANSESOPHAGEAL ECHOCARDIOGRAM (TEE);  Surgeon: Larey Dresser, MD;  Location: [facility];  Service: Cardiovascular;  Laterality: N/A;  . TEE WITHOUT CARDIOVERSION N/A 11/24/2015   Procedure: TRANSESOPHAGEAL ECHOCARDIOGRAM (TEE);  Surgeon: Josue Hector, MD;  Location: [facility];  Service: Cardiovascular;  Laterality:  N/A;  . XI ROBOTIC ASSISTED COLOSTOMY TAKEDOWN N/A 03/07/2019   Procedure: XI ROBOTIC ASSISTED LOW ANTERIOR RESECTION, RIGID PROCTOSCOPY;  Surgeon: Michael Boston, MD;  [facility];  Service: General;  Laterality: N/A;    There were no vitals filed for this visit.  Subjective Assessment - 05/20/19 1108    Subjective  Pt last appt is Friday.            ADULT SLP TREATMENT - 05/20/19 1114      General Information   Behavior/Cognition  Alert;Cooperative;Pleasant  mood      Treatment Provided   Treatment provided  Cognitive-Linquistic      Cognitive-Linquistic Treatment   Treatment focused on  Aphasia    Skilled Treatment  Pt tells SLP with going back to work Sunday that he wold like to have one additional session in two weeks as a "check in" appointmetn to verify success and to ascertainwhether further ST is needed. Pt again did not bring phone in session today however has stated he has been using text-to-speech for text messages, and for rare emails. Pt stated this feature has worked "very successfully" - pt to bring his phone next session.Marland Kitchen SLP and pt walked outdoors and pt with functional mod complex conversation with x2 anomic events - pt used description strategy and SLP was able to assist pt x1, the second instance pt thought of the word when using the strategy via self-semantic  cuing withdescription.       Assessment / Recommendations / Plan   Plan  Other (Comment)   one more visit (at least) to ensure progress     Progression Toward Goals   Progression toward goals  Progressing toward goals         SLP Short Term Goals - 05/13/19 1105      SLP SHORT TERM GOAL #1   Title  Pt will demo HEP for dysphagia with rare min A x 3 sessions.    Time  1    Period  Weeks    Status  Partially Met      SLP SHORT TERM GOAL #2   Title  Pt will complete further assessment of cognition and writing and goals added as appropriate, within first 4 visits.    Status  Achieved      SLP SHORT TERM GOAL #3   Title  Pt will utitlize multimodal communication/compensations for anomia in 8 minutes simple-mod complex conversation over 3 sessions with occasional min A    Baseline  04/15/19 04/17/19 04/24/19    Time  1    Period  Weeks    Status  Achieved      SLP SHORT TERM GOAL #4   Title  Pt will alternate attention between 2 functional tasks for 12 minutes >90% accuracy x 2 sessions with compensations allowed.    Baseline  04/17/19    Time  4    Period   Weeks    Status  Partially Met       SLP Long Term Goals - 05/20/19 1113      SLP LONG TERM GOAL #1   Title  Pt will complete HEP for dysphagia with modified independence x 3 sessions.    Time  --    Period  --    Status  Deferred   pt wishes to focus on language expression at this time     SLP [place] #2   Title  Pt will participate in repeat MBS for objective  assessment of swallow function to determine readiness for diet advancement.    Status  Deferred   on hold as pt is electing to take thin liquids at this time; will continue to monitor clinically     SLP LONG TERM GOAL #3   Title  Pt will utitlize multimodal communication/compensations for anomia in 12 minutes mod complex conversation over 5 sessions with occasional min A    Baseline  05/08/11, 05-13-19, 05/15/19, 05-20-19    Time  1    Period  Weeks    Status  Revised      SLP LONG TERM GOAL #4   Title  Pt will demo functional divided attention for 15 minutes x2 sessions, >95% accuracy on tasks with double checking allowed, x2 sessions.    Time  1    Period  Weeks    Status  On-going      SLP LONG TERM GOAL #5   Title  Patient will compose written text messages and/or brief email responses using compensations (speech-to-text).    Time  1    Period  Weeks    Status  On-going       Plan - 05/20/19 1147    Clinical Impression Statement  Mr. Heckard cont to present with mild anomic aphasia, mild dysarthria and oropharyngeal dysphagia. I would classify his deficit as mild; 15 minutes mod complex conversation was functional today as pt used compensations and extended time.E rror awareness was excellent. Pt agreed that one extra visit in approx two weeks would be helpful to ensure progress contingues after pt returns to work. I recommend cont'd skilled ST for at least two more sessions to address communication and swallowing impairments, in order to maximize pt's independence and safety, increase his ability to communicate  wants and needs, and improve quality of life.    Speech Therapy Frequency  2x / week    Duration  --   8 weeks/17 visits   Treatment/Interventions  Aspiration precaution training;Diet toleration management by SLP;Environmental controls;Trials of upgraded texture/liquids;Cognitive reorganization;Oral motor exercises;Multimodal communcation approach;Compensatory strategies;Pharyngeal strengthening exercises;Language facilitation;Compensatory techniques;Cueing hierarchy;Internal/external aids;Functional tasks;SLP instruction and feedback;Patient/family education    Potential to Achieve Goals  Good    Consulted and Agree with Plan of Care  Patient       Patient will benefit from skilled therapeutic intervention in order to improve the following deficits and impairments:   Aphasia  Cognitive communication deficit  Dysphagia, oropharyngeal phase    Problem List Patient Active Problem List   Diagnosis Date Noted  . Hypokalemia 03/12/2019  . Expressive aphasia 03/10/2019  . Dysphagia 03/10/2019  . Middle cerebral artery embolism, left 03/09/2019  . Diverticular stricture ([place]) 03/07/2019  . Bacteria in urine   . Chronic atrial fibrillation ([place]) 12/02/2018  . Current use of long term anticoagulation 12/02/2018  . UTI (urinary tract infection) 11/29/2018  . Hyponatremia 11/29/2018  . PAF (paroxysmal atrial fibrillation) ([place]) 11/29/2018  . Diverticulitis of large intestine with abscess 09/23/2018  . Dyslipidemia 12/05/2017  . CAD (coronary artery disease), native coronary artery 12/06/2015  . SOB (shortness of breath)   . NICM (nonischemic cardiomyopathy) ([place])   . Thyroid activity decreased   . Hypothyroidism 08/12/2013  . History of radiation therapy 05/30/2012  . Smokes tobacco daily 05/30/2012  . Cancer of tonsillar fossa ([place]) 11/27/2011  . Tonsil cancer ([place]) 2005  . History of chemotherapy 2005    [place] ,[place], [place]  05/20/2019, 5:25 PM  [facility] [phone]  [place], [place], [address] Phone: [phone]   Fax:  [phone]   Name: PHILBERT OCALLAGHAN MRN: [id number] Date of Birth: Aug 10, 1955

## 2019-05-20 NOTE — Patient Instructions (Signed)
Medication Instructions:  No changes If you need a refill on your cardiac medications before your next appointment, please call your pharmacy.   Lab work: none If you have labs (blood work) drawn today and your tests are completely normal, you will receive your results only by: Marland Kitchen MyChart Message (if you have MyChart) OR . A paper copy in the mail If you have any lab test that is abnormal or we need to change your treatment, we will call you to review the results.  Testing/Procedures: none  Follow-Up: At [facility], you and your health needs are our priority.  As part of our continuing mission to provide you with exceptional heart care, we have created designated Provider Care Teams.  These Care Teams include your primary Cardiologist (physician) and Advanced Practice Providers (APPs -  Physician Assistants and Nurse Practitioners) who all work together to provide you with the care you need, when you need it. You will need a follow up appointment in 6 months.  Please call our office 2 months in advance to schedule this appointment.  You may see one of the following Advanced Practice Providers on your designated Care Team:   Melina Copa, [place] . Ermalinda Barrios, PA-C  Your physician wants you to follow-up in: 1 year with Dr. [name]. You will receive a reminder letter in the mail two months in advance. If you don't receive a letter, please call our office to schedule the follow-up appointment.   Any Other Special Instructions Will Be Listed Below (If Applicable).

## 2019-05-23 ENCOUNTER — Other Ambulatory Visit: Payer: Self-pay

## 2019-05-23 ENCOUNTER — Ambulatory Visit: Payer: BC Managed Care – PPO

## 2019-05-23 DIAGNOSIS — R1312 Dysphagia, oropharyngeal phase: Secondary | ICD-10-CM

## 2019-05-23 DIAGNOSIS — R4701 Aphasia: Secondary | ICD-10-CM | POA: Diagnosis not present

## 2019-05-23 DIAGNOSIS — R41841 Cognitive communication deficit: Secondary | ICD-10-CM

## 2019-05-23 NOTE — Therapy (Signed)
[facility] [address] [place], [place], [address] Phone: [phone]   Fax:  [phone]  Speech Language Pathology Treatment  Patient Details  Name: Adrian [name] MRN: [id number] Date of Birth: 1955/08/10 Referring Provider (SLP): [name], MD (PCP) referred to therapy by NP at hospital   Encounter Date: 05/23/2019  End of Session - 05/23/19 1621    Visit Number  15    Number of Visits  17    Date for SLP Re-Evaluation  05/24/19    SLP Start Time  1535    SLP Stop Time   1615    SLP Time Calculation (min)  40 min    Activity Tolerance  Patient tolerated treatment well       Past Medical History:  Diagnosis Date  . AF (paroxysmal atrial fibrillation) ([place]) 11/29/2018  . Atrial flutter ([place]) 10/12/2015  . CAD (coronary artery disease), native coronary artery 12/06/2015   a. 10/2015 MV: EF  37%, reversible defect inferior apex, intermediate risk findings; b. 10/2015 Cath: 20% mid RCA.  . Colonic diverticular abscess   . Diverticulitis   . History of chemotherapy 2005   Cisplatin  . Hypothyroidism   . NICM (nonischemic cardiomyopathy) ([place]) 10/14/2015   a. Tachy mediated?;  b. Echo 3/17 - Mild concentric LVH, EF 30-35%, anteroseptal, anterior, anterolateral, apical anterior, lateral hypokinesis, trivial MR, mild to moderately reduced RVSF; c. LHC 3/17 - mRCA 20%  . Paroxysmal atrial flutter ([place])    a. TEE 3/17 with ? LAA clot-->s/p TEE/DCCV 11/24/2015  . Radiation NOv.3,2005-Dec. 15, 2005   6810 cGy in 30 fractions  . 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  XRT    Past Surgical History:  Procedure Laterality Date  . APPENDECTOMY N/A 03/07/2019   Procedure: ROBOTIC ASSISTED APPENDECTOMY;  Surgeon: Michael Boston, MD;  [facility];  Service: General;  Laterality: N/A;  . CARDIAC CATHETERIZATION N/A 10/18/2015   Procedure: Left Heart Cath and Coronary Angiography;  Surgeon: Burnell Blanks, MD;   Location: [facility];  Service: Cardiovascular;  Laterality: N/A;  . CARDIOVERSION N/A 11/24/2015   Procedure: CARDIOVERSION;  Surgeon: Josue Hector, MD;  Location: [facility];  Service: Cardiovascular;  Laterality: N/A;  . CYSTOSCOPY WITH STENT PLACEMENT Bilateral 03/07/2019   Procedure: CYSTOSCOPY WITH BILATERAL FIREFLY INJECTION;  Surgeon: Ardis Hughs, MD;  [facility];  Service: Urology;  Laterality: Bilateral;  . GASTROSTOMY TUBE PLACEMENT  07/04/2004   IR - G tube for tonsilar cancer  . IR ANGIO INTRA EXTRACRAN SEL COM CAROTID INNOMINATE UNI R MOD SED  03/09/2019  . IR ANGIO VERTEBRAL SEL VERTEBRAL UNI R MOD SED  03/09/2019  . IR CT HEAD LTD  03/09/2019  . IR PERCUTANEOUS ART THROMBECTOMY/INFUSION INTRACRANIAL INC DIAG ANGIO  03/09/2019  . IR RADIOLOGIST EVAL & MGMT  12/18/2018  . LAMINECTOMY     C5/placement of steel plate  . NECK SURGERY  2003   replaced disk  . RADIOLOGY WITH ANESTHESIA N/A 03/08/2019   Procedure: IR WITH ANESTHESIA;  Surgeon: Luanne Bras, MD;  Location: [facility];  Service: Radiology;  Laterality: N/A;  . TEE WITHOUT CARDIOVERSION N/A 10/15/2015   Procedure: TRANSESOPHAGEAL ECHOCARDIOGRAM (TEE);  Surgeon: Larey Dresser, MD;  Location: [facility];  Service: Cardiovascular;  Laterality: N/A;  . TEE WITHOUT CARDIOVERSION N/A 11/24/2015   Procedure: TRANSESOPHAGEAL ECHOCARDIOGRAM (TEE);  Surgeon: Josue Hector, MD;  Location: [facility];  Service: Cardiovascular;  Laterality:  N/A;  . XI ROBOTIC ASSISTED COLOSTOMY TAKEDOWN N/A 03/07/2019   Procedure: XI ROBOTIC ASSISTED LOW ANTERIOR RESECTION, RIGID PROCTOSCOPY;  Surgeon: Michael Boston, MD;  [facility];  Service: General;  Laterality: N/A;    There were no vitals filed for this visit.  Subjective Assessment - 05/23/19 1542    Subjective  "I'm ready to go back (to work)."    Currently in Pain?  No/denies            ADULT SLP TREATMENT - 05/23/19 1543      General Information    Behavior/Cognition  Alert;Cooperative;Pleasant mood      Treatment Provided   Treatment provided  Cognitive-Linquistic      Cognitive-Linquistic Treatment   Treatment focused on  Cognition;Aphasia    Skilled Treatment  Cognition (14 minutes): SLP and pt talked about pt going back to work and he may experience some more complex cognitive things at work than he's had to do at home- SLP made pt aware we could work on cognitive linguistics at a later date, adn oculd talk about that at the 06-06-19 visit. SLP had pt complete written task and engage in simple one-sentence responses and pt maintained divided attention 50%, alternating attention 50%.  Pt reports he has made dinner and watched TV at the same time without incident, however does not feel like it is as "tight" as it was premorbidly. SLP encouraged pt to alternate attention instead of multitask.  Speech tx, individual: Pt/SLP reviewed speech to text for text function and pt sent text with independence. In email, pt req'd extra time and mod SLP cues initially faded to occasional min cues. Pt reports he does not send many emails, just mainly receives them.       Assessment / Recommendations / Plan   Plan  --   d/c vs cont with cognitive tx-discuss next session     Progression Toward Goals   Progression toward goals  Progressing toward goals         SLP Short Term Goals - 05/13/19 1105      SLP SHORT TERM GOAL #1   Title  Pt will demo HEP for dysphagia with rare min A x 3 sessions.    Time  1    Period  Weeks    Status  Partially Met      SLP SHORT TERM GOAL #2   Title  Pt will complete further assessment of cognition and writing and goals added as appropriate, within first 4 visits.    Status  Achieved      SLP SHORT TERM GOAL #3   Title  Pt will utitlize multimodal communication/compensations for anomia in 8 minutes simple-mod complex conversation over 3 sessions with occasional min A    Baseline  04/15/19 04/17/19 04/24/19    Time   1    Period  Weeks    Status  Achieved      SLP SHORT TERM GOAL #4   Title  Pt will alternate attention between 2 functional tasks for 12 minutes >90% accuracy x 2 sessions with compensations allowed.    Baseline  04/17/19    Time  4    Period  Weeks    Status  Partially Met       SLP Long Term Goals - 05/23/19 1623      SLP LONG TERM GOAL #1   Title  Pt will complete HEP for dysphagia with modified independence x 3 sessions.    Status  Deferred   pt wishes to focus on language expression at this time     SLP LONG TERM GOAL #2   Title  Pt will participate in repeat MBS for objective assessment of swallow function to determine readiness for diet advancement.    Status  Deferred   on hold as pt is electing to take thin liquids at this time; will continue to monitor clinically     SLP LONG TERM GOAL #3   Title  Pt will utitlize multimodal communication/compensations for anomia in 12 minutes mod complex conversation over 5 sessions with occasional min A    Baseline  05/08/11, 05-13-19, 05/15/19, 05-20-19    Time  1    Period  Weeks    Status  On-going      SLP LONG TERM GOAL #4   Title  Pt will demo functional divided attention for 15 minutes x2 sessions, >95% accuracy on tasks with double checking allowed, x2 sessions.    Baseline  05-23-19    Time  1    Period  Weeks    Status  On-going      SLP LONG TERM GOAL #5   Title  Patient will compose written text messages and/or brief email responses using compensations (speech-to-text).    Status  Achieved       Plan - 05/23/19 1622    Clinical Impression Statement  Mr. Shapley cont to present with mild anomic aphasia, mild dysarthria and oropharyngeal dysphagia. I would classify his deficit as mild; 20 minutes simple-mod complex conversation was functional/normal today as pt used compensations and extended time.E rror awareness was excellent. Pt agreed that one extra visit in approx two weeks would be helpful to ensure progress  contingues after pt returns to work. I recommend cont'd skilled ST for at least one more session to address communication and to discuss possible cognitive communication issues arising/aware of since return to work, to increase his ability to communicate wants and needs, and improve quality of life.    Speech Therapy Frequency  2x / week    Duration  --   8 weeks/17 visits   Treatment/Interventions  Aspiration precaution training;Diet toleration management by SLP;Environmental controls;Trials of upgraded texture/liquids;Cognitive reorganization;Oral motor exercises;Multimodal communcation approach;Compensatory strategies;Pharyngeal strengthening exercises;Language facilitation;Compensatory techniques;Cueing hierarchy;Internal/external aids;Functional tasks;SLP instruction and feedback;Patient/family education    Potential to Achieve Goals  Good    Consulted and Agree with Plan of Care  Patient       Patient will benefit from skilled therapeutic intervention in order to improve the following deficits and impairments:   Aphasia  Dysphagia, oropharyngeal phase  Cognitive communication deficit    Problem List Patient Active Problem List   Diagnosis Date Noted  . Hypokalemia 03/12/2019  . Expressive aphasia 03/10/2019  . Dysphagia 03/10/2019  . Middle cerebral artery embolism, left 03/09/2019  . Diverticular stricture ([place]) 03/07/2019  . Bacteria in urine   . Chronic atrial fibrillation ([place]) 12/02/2018  . Current use of long term anticoagulation 12/02/2018  . UTI (urinary tract infection) 11/29/2018  . Hyponatremia 11/29/2018  . PAF (paroxysmal atrial fibrillation) ([place]) 11/29/2018  . Diverticulitis of large intestine with abscess 09/23/2018  . Dyslipidemia 12/05/2017  . CAD (coronary artery disease), native coronary artery 12/06/2015  . SOB (shortness of breath)   . NICM (nonischemic cardiomyopathy) ([place])   . Thyroid activity decreased   . Hypothyroidism 08/12/2013  . History of  radiation therapy 05/30/2012  . Smokes tobacco daily 05/30/2012  . Cancer of tonsillar fossa ([place])  11/27/2011  . Tonsil cancer ([place]) 2005  . History of chemotherapy 2005    [place] ,[place], [place]  05/23/2019, 4:25 PM  [facility] [address] [place], [place], [address] Phone: [phone]   Fax:  [phone]   Name: RICCI DIROCCO MRN: [id number] Date of Birth: [birth year]

## 2019-05-30 ENCOUNTER — Other Ambulatory Visit: Payer: Self-pay | Admitting: Family Medicine

## 2019-05-30 DIAGNOSIS — E039 Hypothyroidism, unspecified: Secondary | ICD-10-CM

## 2019-05-30 NOTE — Telephone Encounter (Signed)
Requested medication (s) are due for refill today: no  Requested medication (s) are on the active medication list: yes  Last refill:  9/0/2020  Future visit scheduled: no  Notes to clinic:  Patient is requesting medication be sent to [facility]. I spoke to pharmacy and patient is trying to get medication to early. It ws filled on 04/17/2019 for 90 day and want be able to refill again until 06/24/2019. Please advise   Requested Prescriptions  Pending Prescriptions Disp Refills   levothyroxine (SYNTHROID) 112 MCG tablet 90 tablet 3    Sig: Take 1 tablet (112 mcg total) by mouth daily.     Endocrinology:  Hypothyroid Agents Failed - 05/30/2019  1:42 PM      Failed - TSH needs to be rechecked within 3 months after an abnormal result. Refill until TSH is due.      Failed - TSH in normal range and within 360 days    TSH  Date Value Ref Range Status  04/11/2019 0.33 (L) 0.35 - 4.50 uIU/mL Final         Passed - Valid encounter within last 12 months    Recent Outpatient Visits          1 month ago Hypothyroidism, unspecified type   Therapist, music at [facility], Alinda Sierras, MD   6 months ago [facility] at [facility], Alinda Sierras, MD   1 year ago Atrial flutter, unspecified type ([place])   Therapist, music at [facility], Alinda Sierras, MD   3 years ago Screen for STD (sexually transmitted disease)   Therapist, music at [facility], Alinda Sierras, MD   3 years ago Generalized abdominal pain   Primary [facility] at [facility], Loura Back, MD

## 2019-05-30 NOTE — Telephone Encounter (Signed)
Copied from [facility] [id number]. Topic: General - Other >> May 30, 2019  1:32 PM Mcneil, Ja-Kwan wrote: Reason for CRM: Pt wife stated she contacted [facility] regarding pt Rx for levothyroxine (SYNTHROID) 112 MCG tablet and she was told that they need the Rx to be resubmitted. Pt wife requests that the Rx be sent again to [facility]

## 2019-05-30 NOTE — Telephone Encounter (Signed)
Spoke with pt and we went over the bottle that he just got on 04/2019. We verified that he in fact had got a qty of #90 and verified date that it was filled. I made sure pt was only taking once daily. Pt understood that he does not need a refill

## 2019-05-30 NOTE — Telephone Encounter (Signed)
Message routed to PCP CMA  

## 2019-06-06 ENCOUNTER — Ambulatory Visit: Payer: BC Managed Care – PPO

## 2019-07-15 ENCOUNTER — Ambulatory Visit: Payer: BC Managed Care – PPO | Admitting: Adult Health

## 2019-07-15 ENCOUNTER — Other Ambulatory Visit: Payer: Self-pay

## 2019-07-15 ENCOUNTER — Encounter: Payer: Self-pay | Admitting: Adult Health

## 2019-07-15 VITALS — BP 122/78 | HR 74 | Temp 97.2°F | Ht 68.0 in | Wt 130.4 lb

## 2019-07-15 DIAGNOSIS — I480 Paroxysmal atrial fibrillation: Secondary | ICD-10-CM

## 2019-07-15 DIAGNOSIS — I10 Essential (primary) hypertension: Secondary | ICD-10-CM | POA: Diagnosis not present

## 2019-07-15 DIAGNOSIS — I63411 Cerebral infarction due to embolism of right middle cerebral artery: Secondary | ICD-10-CM | POA: Diagnosis not present

## 2019-07-15 DIAGNOSIS — E785 Hyperlipidemia, unspecified: Secondary | ICD-10-CM | POA: Diagnosis not present

## 2019-07-15 NOTE — Progress Notes (Signed)
I agree with the above plan 

## 2019-07-15 NOTE — Progress Notes (Signed)
[facility] [address] [place]. [place] [address] [phone]       OFFICE FOLLOW-UP NOTE  [name] Date of Birth:  Dec 02, 1955 Medical Record Number:  [id number]   No chief complaint on file.    HPI:  Initial visit 04/15/2019 PS: Adrian [name] is a 63 year old Caucasian male seen today for initial office follow-up visit following hospital admission for stroke in August 2020.  History is obtained from the patient, his ex wife, review of electronic medical records and have personally reviewed imaging films in PACS.  He has past medical history of coronary artery disease, tonsillar cancer in remission, atrial fibrillation on anticoagulation which was held 5 days ago prior to admission for diverticulitis of large intestine with abscess requiring exploratory laparotomy with low anterior resection, rigid proctoscopy and appendicectomy.  He was last seen normal on 03/08/2019 at 7:30 PM and was still on hospital and he developed sudden onset of speech difficulties and speech became nonsensical he had trouble expressing himself.  Code stroke was activated his NIH stroke scale was 2 on assessment there.  Noncontrast CT scan of the head was negative for bleed TPA was not given due to recent surgery 1 day prior.  CT angiogram of the head and neck was obtained which showed left cervical carotid occlusion as well as left MCA occlusion and CT perfusion showed acute cord infarct involving the anterior left MCA distribution with moderate surrounding ischemic penumbra favorable for revascularization.  Patient was transferred to [facility] where he underwent mechanical thrombectomy of the occluded left ICA with revascularization but only partial revascularization of the left MCA.  MRI scan of the brain showed moderate size left MCA infarct with trace petechial hemorrhage with decreased left ICA flow void.  2D echo showed normal ejection fraction.  LDL cholesterol 46 mg percent.   Hemoglobin A1c was 5.5.  Patient is started on Eliquis for his history of atrial fibrillation.  He was discharged home with outpatient physical occupational and speech therapy.   Patient states he is continuing therapies but is currently getting only speech and occupational therapy 1 day a week.  He still has some word finding difficulties particularly towards afternoon when he is tired he struggles to speak.  He is currently out on short-term disability to October 23 from his diverticulitis.  He wants to go back to work and will be working part-time 6 hours/day.  He does not involve a lot of speaking and talking in his job.  He is tolerating Eliquis well without bruising or bleeding.  His blood pressure is well controlled and today it is 124/74.  He is tolerating Lipitor without muscle aches and pains.  Patient continues to smoke but states he is willing to quit soon.  He wants to return back to work on 05/25/2019.  Update 07/15/2019: Adrian [name] is a 63 year old male who is being seen today for stroke follow-up.  Residual deficits occasional speech hesitancy or difficulty finding the correct word but overall improving.  He has also continued to experience decreased energy levels and decreased motivation but this has been slightly improving.  He has returned back to work without great difficulty working 7 to 8 hours/day.  He denies insomnia or snoring.  He has actually been working on weight gain and has gained 5 pounds since prior visit.  Continues on Eliquis for history of atrial fibrillation and secondary stroke prevention without bleeding or bruising.  Continues on atorvastatin without myalgias.  Blood pressure today  122/78.  No further concerns at this time.    ROS:   14 system review of systems is positive for speech difficulties, word finding difficulties, increased fatigue, tiredness and all other systems negative  PMH:  Past Medical History:  Diagnosis Date   AF (paroxysmal atrial fibrillation)  ([place]) 11/29/2018   Atrial flutter ([place]) 10/12/2015   CAD (coronary artery disease), native coronary artery 12/06/2015   a. 10/2015 MV: EF  37%, reversible defect inferior apex, intermediate risk findings; b. 10/2015 Cath: 20% mid RCA.   Colonic diverticular abscess    Diverticulitis    History of chemotherapy 2005   Cisplatin   Hypothyroidism    NICM (nonischemic cardiomyopathy) ([place]) 10/14/2015   a. Tachy mediated?;  b. Echo 3/17 - Mild concentric LVH, EF 30-35%, anteroseptal, anterior, anterolateral, apical anterior, lateral hypokinesis, trivial MR, mild to moderately reduced RVSF; c. LHC 3/17 - mRCA 20%   Paroxysmal atrial flutter ([place])    a. TEE 3/17 with ? LAA clot-->s/p TEE/DCCV 11/24/2015   Radiation NOv.3,2005-Dec. 15, 2005   6810 cGy in 30 fractions   Tonsil cancer [facility]) 2005   Dr Romeo Rabon.  XRT    Social History:  Social History   Socioeconomic History   Marital status: Married    Spouse name: Not on file   Number of children: Not on file   Years of education: Not on file   Highest education level: Not on file  Occupational History   Not on file  Social Needs   Financial resource strain: Not on file   Food insecurity    Worry: Not on file    Inability: Not on file   Transportation needs    Medical: Not on file    Non-medical: Not on file  Tobacco Use   Smoking status: Current Every Day Smoker    Packs/day: 0.50    Years: 20.00    Pack years: 10.00    Types: Cigarettes   Smokeless tobacco: Never Used   Tobacco comment: 1/2 pack per day  Substance and Sexual Activity   Alcohol use: Yes    Alcohol/week: 12.0 standard drinks    Types: 12 Cans of beer per week    Comment: couple of beers each day   Drug use: No   Sexual activity: Not on file  Lifestyle   Physical activity    Days per week: Not on file    Minutes per session: Not on file   Stress: Not on file  Relationships   Social connections    Talks on phone: Not on file    Gets  together: Not on file    Attends religious service: Not on file    Active member of club or organization: Not on file    Attends meetings of clubs or organizations: Not on file    Relationship status: Not on file   Intimate partner violence    Fear of current or ex partner: Not on file    Emotionally abused: Not on file    Physically abused: Not on file    Forced sexual activity: Not on file  Other Topics Concern   Not on file  Social History Narrative   Not on file    Medications:   Current Outpatient Medications on File Prior to Visit  Medication Sig Dispense Refill   apixaban (ELIQUIS) 5 MG TABS tablet Take 1 tablet (5 mg total) by mouth 2 (two) times daily. 180 tablet 1   atorvastatin (LIPITOR) 40 MG tablet  Take 1 tablet (40 mg total) by mouth daily. 90 tablet 3   levothyroxine (SYNTHROID) 112 MCG tablet Take 1 tablet (112 mcg total) by mouth daily. 90 tablet 3   nicotine (NICODERM CQ) 14 mg/24hr patch After 21mg  patches are complete, apply 14mg  patch one daily for 2 weeks. Remove old patch before applying new one. (Patient not taking: Reported on 05/20/2019) 14 patch 0   No current facility-administered medications on file prior to visit.     Allergies:  No Known Allergies  Physical Exam  Today's Vitals   07/15/19 0813  BP: 122/78  Pulse: 74  Temp: (!) 97.2 F (36.2 C)  Weight: 130 lb 6.4 oz (59.1 kg)  Height: 5\' 8"  (1.727 m)   Body mass index is 19.83 kg/m.  General: well developed, well nourished, pleasant middle-age Caucasian male, seated, in no evident distress Head: head normocephalic and atraumatic.  Neck: supple with no carotid or supraclavicular bruits Cardiovascular: regular rate and rhythm, no murmurs Musculoskeletal: no deformity Skin:  no rash/petichiae Vascular:  Normal pulses all extremities  Neurologic Exam Mental Status: Awake and fully alert. Oriented to place and time. Recent and remote memory intact. Attention span, concentration and  fund of knowledge appropriate. Mood and affect appropriate.  Occasional speech hesitancy and word finding difficulty but overall mild Cranial Nerves: Pupils equal, briskly reactive to light. Extraocular movements full without nystagmus. Visual fields full to confrontation. Hearing intact. Facial sensation intact. Face, tongue, palate moves normally and symmetrically.  Motor: Normal bulk and tone. Normal strength in all tested extremity muscles. Sensory.: intact to touch ,pinprick .position and vibratory sensation.  Coordination: Rapid alternating movements normal in all extremities. Finger-to-nose and heel-to-shin performed accurately bilaterally. Gait and Station: Arises from chair without difficulty. Stance is normal. Gait demonstrates normal stride length and balance . Able to heel, toe and tandem walk without difficulty.  Reflexes: 1+ and symmetric. Toes downgoing.      ASSESSMENT: 63 year old Caucasian male with left MCA branch infarct in August 2020 secondary to left ICA occlusion treated with mechanical thrombectomy with partial revascularization.  Etiology of stroke atrial fibrillation and patient was off anticoagulation for recent surgery.  Vascular risk factors of smoking, hyperlipidemia and atrial fibrillation.  He has been recovering well from a stroke standpoint with residual occasional speech difficulty and decreased energy/endurance but overall improving     PLAN: -Continue Eliquis (apixaban) daily and atorvastatin for secondary stroke prevention -Continue to follow with PCP for HTN and HLD management -Continue to follow with cardiology for atrial fibrillation and Eliquis monitoring/management -Advised to continue to do activities as recommended during therapy sessions for ongoing improvement -Discussion regarding pursuing sleep apnea evaluation due to history of atrial fibrillation and recent stroke along with complaints of decreased energy levels and daily fatigue.  He declines  interest at this time and wishes to wait on evaluation to see if fatigue and energy levels improve.  He is aware to contact office in the future if interested in pursuing.  Discussion regarding increased risk with potential untreated sleep apnea and verbalized understanding. -maintain strict control of hypertension with blood pressure goal below 130/90, diabetes with hemoglobin A1c goal below 6.5% and lipids with LDL cholesterol goal below 70 mg/dL.  Follow-up in 6 months or call earlier if needed   Greater than 50% of time during this 25 minute visit was spent on counseling, explanation of diagnosis, discussion regarding risk factor management, discussion regarding increased risk for sleep apnea, discussion regarding residual deficits, planning of further  management, and answered all questions to patient satisfaction  [name], [facility]  [facility] [address] [place], [place] [address]  Phone [phone] Fax [phone] Note: This document was prepared with digital dictation and possible smart phrase technology. Any transcriptional errors that result from this process are unintentional.

## 2019-07-15 NOTE — Patient Instructions (Signed)
Continue Eliquis (apixaban) daily  and Lipitor for secondary stroke prevention  Continue to follow up with PCP regarding cholesterol and blood pressure management   Continue to follow with cardiology for atrial fibrillation and Eliquis management  Consider pursuing sleep evaluation in the future if you continue to experience decreased energy levels and fatigue as you are at a higher risk of having sleep apnea due to history of atrial fibrillation and recent stroke  Continue to do exercises at home as recommended during therapy for ongoing improvement  Continue to monitor blood pressure at home  Maintain strict control of hypertension with blood pressure goal below 130/90, diabetes with hemoglobin A1c goal below 6.5% and cholesterol with LDL cholesterol (bad cholesterol) goal below 70 mg/dL. I also advised the patient to eat a healthy diet with plenty of whole grains, cereals, fruits and vegetables, exercise regularly and maintain ideal body weight.  Followup in the future with me in 6 months or call earlier if needed       Thank you for coming to see [place] at [facility]. I hope we have been able to provide you high quality care today.  You may receive a patient satisfaction survey over the next few weeks. We would appreciate your feedback and comments so that we may continue to improve ourselves and the health of our patients.

## 2019-08-31 ENCOUNTER — Other Ambulatory Visit: Payer: Self-pay | Admitting: Physician Assistant

## 2019-09-01 NOTE — Telephone Encounter (Signed)
Eliquis 5mg  refill request received, pt is 64yrs old, weight-59.1kg, Crea-0.51 on 03/15/2019, Diagnosis-Afib, and last seen by Dr. [name] on 05/20/2019. Dose is appropriate based on dosing criteria. Will send in refill to requested pharmacy.

## 2019-09-16 ENCOUNTER — Encounter: Payer: Self-pay | Admitting: Speech Pathology

## 2019-09-16 NOTE — Therapy (Signed)
SPEECH THERAPY DISCHARGE SUMMARY  Visits from Start of Care: 15  Current functional level related to goals / functional outcomes: See goals below. Goals partially met; pt did not return for last visits.   Remaining deficits: mild anomic aphasia, mild dysarthria and oropharyngeal dysphagia   Education / Equipment: Compensations for aphasia, s/sx of aspiration pna Plan: Patient agrees to discharge.  Patient goals were partially met. Patient is being discharged due to not returning since the last visit.  ?????        SLP Short Term Goals - 09/16/19 0905      SLP SHORT TERM GOAL #1   Title  Pt will demo HEP for dysphagia with rare min A x 3 sessions.    Time  1    Period  Weeks    Status  Partially Met      SLP SHORT TERM GOAL #2   Title  Pt will complete further assessment of cognition and writing and goals added as appropriate, within first 4 visits.    Status  Achieved      SLP SHORT TERM GOAL #3   Title  Pt will utitlize multimodal communication/compensations for anomia in 8 minutes simple-mod complex conversation over 3 sessions with occasional min A    Baseline  04/15/19 04/17/19 04/24/19    Time  1    Period  Weeks    Status  Achieved      SLP SHORT TERM GOAL #4   Title  Pt will alternate attention between 2 functional tasks for 12 minutes >90% accuracy x 2 sessions with compensations allowed.    Baseline  04/17/19    Time  4    Period  Weeks    Status  Partially Met      SLP Long Term Goals - 09/16/19 0905      SLP LONG TERM GOAL #1   Title  Pt will complete HEP for dysphagia with modified independence x 3 sessions.    Status  Deferred   pt wishes to focus on language expression at this time     SLP [place] #2   Title  Pt will participate in repeat MBS for objective assessment of swallow function to determine readiness for diet advancement.    Status  Deferred   on hold as pt is electing to take thin liquids at this time; will continue to monitor  clinically     SLP LONG TERM GOAL #3   Title  Pt will utitlize multimodal communication/compensations for anomia in 12 minutes mod complex conversation over 5 sessions with occasional min A    Baseline  05/08/11, 05-13-19, 05/15/19, 05-20-19    Time  1    Period  Weeks    Status  Partially Met      SLP LONG TERM GOAL #4   Title  Pt will demo functional divided attention for 15 minutes x2 sessions, >95% accuracy on tasks with double checking allowed, x2 sessions.    Baseline  05-23-19    Time  1    Period  Weeks    Status  Partially Met      SLP LONG TERM GOAL #5   Title  Patient will compose written text messages and/or brief email responses using compensations (speech-to-text).    Status  Achieved        Deneise Lever, [place], [place] [address] [place], [place], [address] Phone: [phone]   Fax:  [phone]  Patient Details  Name: Adrian [name] MRN: [id number] Date of Birth: Dec 27, 1955 Referring Provider:  No ref. provider found  Encounter Date: 09/16/2019   Aliene Altes 09/16/2019, 9:05 AM  [facility] [address] [place], [place], [address] Phone: [phone]   Fax:  [phone]

## 2019-09-30 ENCOUNTER — Emergency Department (HOSPITAL_COMMUNITY): Payer: BC Managed Care – PPO

## 2019-09-30 ENCOUNTER — Emergency Department (HOSPITAL_COMMUNITY)
Admission: EM | Admit: 2019-09-30 | Discharge: 2019-09-30 | Disposition: A | Discharge status: Home / Self Care | Payer: BC Managed Care – PPO | Attending: Emergency Medicine | Admitting: Emergency Medicine

## 2019-09-30 ENCOUNTER — Encounter (HOSPITAL_COMMUNITY): Payer: Self-pay | Admitting: Family Medicine

## 2019-09-30 ENCOUNTER — Other Ambulatory Visit: Payer: Self-pay

## 2019-09-30 DIAGNOSIS — Y9301 Activity, walking, marching and hiking: Secondary | ICD-10-CM | POA: Diagnosis not present

## 2019-09-30 DIAGNOSIS — E039 Hypothyroidism, unspecified: Secondary | ICD-10-CM | POA: Diagnosis not present

## 2019-09-30 DIAGNOSIS — I2510 Atherosclerotic heart disease of native coronary artery without angina pectoris: Secondary | ICD-10-CM | POA: Diagnosis not present

## 2019-09-30 DIAGNOSIS — W109XXA Fall (on) (from) unspecified stairs and steps, initial encounter: Secondary | ICD-10-CM | POA: Insufficient documentation

## 2019-09-30 DIAGNOSIS — Z79899 Other long term (current) drug therapy: Secondary | ICD-10-CM | POA: Insufficient documentation

## 2019-09-30 DIAGNOSIS — Y999 Unspecified external cause status: Secondary | ICD-10-CM | POA: Diagnosis not present

## 2019-09-30 DIAGNOSIS — S0990XA Unspecified injury of head, initial encounter: Secondary | ICD-10-CM | POA: Diagnosis present

## 2019-09-30 DIAGNOSIS — S0083XA Contusion of other part of head, initial encounter: Secondary | ICD-10-CM | POA: Diagnosis not present

## 2019-09-30 DIAGNOSIS — Y92009 Unspecified place in unspecified non-institutional (private) residence as the place of occurrence of the external cause: Secondary | ICD-10-CM | POA: Insufficient documentation

## 2019-09-30 DIAGNOSIS — F17210 Nicotine dependence, cigarettes, uncomplicated: Secondary | ICD-10-CM | POA: Diagnosis not present

## 2019-09-30 DIAGNOSIS — W19XXXA Unspecified fall, initial encounter: Secondary | ICD-10-CM

## 2019-09-30 DIAGNOSIS — Z7901 Long term (current) use of anticoagulants: Secondary | ICD-10-CM | POA: Insufficient documentation

## 2019-09-30 NOTE — Discharge Instructions (Addendum)
Adrian [name],  Adrian [name] were seen in the emergency room after a fall last week. CT imaging did not show any bleeding or trauma in the brain. Please continue to take ibuprofen as needed for your facial pain as your heal. Follow-up with your primary doctor as needed if your symptoms do not improve or worsen.

## 2019-09-30 NOTE — ED Triage Notes (Signed)
Patient states he fell last Wednesday due to slipping on ice. He did hit his head on cement with no loss of conscious. However, he didn't seek medical treatment. He reports he had a stroke 6 months ago and is currently taking a blood thinner. He reports when he moves his head suddenly, it feels like something is rattling in this head. Denies blurry vision, dizziness, or lightheadedness. Denies nausea and vomiting. Patient is ambulatory with a steady gait.

## 2019-09-30 NOTE — ED Provider Notes (Signed)
Adrian [name]   CSN: [id number] Arrival date & time: 09/30/19  0930    History Chief Complaint  Patient presents with  . Fall   Adrian [name] is a 64 y.o. male with significant PMH of CVA in 03/2019 and afib on Eliquis, who presents after a fall. Pt states that he slipped on ice overlaying his wooden stairs 6 days ago and hit the right side of his face on the concrete porch. He recalls the entire event without loss of consciousness. Takes Eliquis for his a fib and has been continuing to take it after the fall. Denies focal weakness, vision changes, or dizziness/lightheadedness. No prodromal symptoms or preceding chest pain/shortness of breath. Pt has been able to continue to perform his daily tasks including driving and going to work. Decided to come in to be evaluated today as his facial pain and "swimmy feeling" were persisting.   The history is provided by the patient.  Fall This is a new problem. The current episode started more than 2 days ago. Episode frequency: once. Pertinent negatives include no chest pain, no abdominal pain, no headaches and no shortness of breath. Associated symptoms comments: No loss of consciousness. Endorses facial pain. The symptoms are aggravated by bending and twisting. The symptoms are relieved by NSAIDs. The treatment provided mild relief.      Past Medical History:  Diagnosis Date  . AF (paroxysmal atrial fibrillation) ([place]) 11/29/2018  . Atrial flutter ([place]) 10/12/2015  . CAD (coronary artery disease), native coronary artery 12/06/2015   a. 10/2015 MV: EF  37%, reversible defect inferior apex, intermediate risk findings; b. 10/2015 Cath: 20% mid RCA.  . Colonic diverticular abscess   . Diverticulitis   . History of chemotherapy 2005   Cisplatin  . Hypothyroidism   . NICM (nonischemic cardiomyopathy) ([place]) 10/14/2015   a. Tachy mediated?;  b. Echo 3/17 - Mild concentric LVH, EF 30-35%, anteroseptal, anterior,  anterolateral, apical anterior, lateral hypokinesis, trivial MR, mild to moderately reduced RVSF; c. LHC 3/17 - mRCA 20%  . Paroxysmal atrial flutter ([place])    a. TEE 3/17 with ? LAA clot-->s/p TEE/DCCV 11/24/2015  . Radiation NOv.3,2005-Dec. 15, 2005   6810 cGy in 30 fractions  . 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  XRT    Patient Active Problem List   Diagnosis Date Noted  . Hypokalemia 03/12/2019  . Expressive aphasia 03/10/2019  . Dysphagia 03/10/2019  . Middle cerebral artery embolism, left 03/09/2019  . Diverticular stricture ([place]) 03/07/2019  . Bacteria in urine   . Chronic atrial fibrillation ([place]) 12/02/2018  . Current use of long term anticoagulation 12/02/2018  . UTI (urinary tract infection) 11/29/2018  . Hyponatremia 11/29/2018  . PAF (paroxysmal atrial fibrillation) ([place]) 11/29/2018  . Diverticulitis of large intestine with abscess 09/23/2018  . Dyslipidemia 12/05/2017  . CAD (coronary artery disease), native coronary artery 12/06/2015  . SOB (shortness of breath)   . NICM (nonischemic cardiomyopathy) ([place])   . Thyroid activity decreased   . Hypothyroidism 08/12/2013  . History of radiation therapy 05/30/2012  . Smokes tobacco daily 05/30/2012  . Cancer of tonsillar fossa ([place]) 11/27/2011  . Tonsil cancer ([place]) 2005  . History of chemotherapy 2005    Past Surgical History:  Procedure Laterality Date  . APPENDECTOMY N/A 03/07/2019   Procedure: ROBOTIC ASSISTED APPENDECTOMY;  Surgeon: Michael Boston, MD;  [facility];  Service: General;  Laterality: N/A;  . CARDIAC CATHETERIZATION N/A 10/18/2015  Procedure: Left Heart Cath and Coronary Angiography;  Surgeon: Burnell Blanks, MD;  Location: [place] CV LAB;  Service: Cardiovascular;  Laterality: N/A;  . CARDIOVERSION N/A 11/24/2015   Procedure: CARDIOVERSION;  Surgeon: Josue Hector, MD;  Location: [place];  Service: Cardiovascular;  Laterality: N/A;  . CYSTOSCOPY WITH STENT PLACEMENT  Bilateral 03/07/2019   Procedure: CYSTOSCOPY WITH BILATERAL FIREFLY INJECTION;  Surgeon: Ardis Hughs, MD;  [facility];  Service: Urology;  Laterality: Bilateral;  . GASTROSTOMY TUBE PLACEMENT  07/04/2004   IR - G tube for tonsilar cancer  . IR ANGIO INTRA EXTRACRAN SEL COM CAROTID INNOMINATE UNI R MOD SED  03/09/2019  . IR ANGIO VERTEBRAL SEL VERTEBRAL UNI R MOD SED  03/09/2019  . IR CT HEAD LTD  03/09/2019  . IR PERCUTANEOUS ART THROMBECTOMY/INFUSION INTRACRANIAL INC DIAG ANGIO  03/09/2019  . IR RADIOLOGIST EVAL & MGMT  12/18/2018  . LAMINECTOMY     C5/placement of steel plate  . NECK SURGERY  2003   replaced disk  . RADIOLOGY WITH ANESTHESIA N/A 03/08/2019   Procedure: IR WITH ANESTHESIA;  Surgeon: Luanne Bras, MD;  Location: [place];  Service: Radiology;  Laterality: N/A;  . TEE WITHOUT CARDIOVERSION N/A 10/15/2015   Procedure: TRANSESOPHAGEAL ECHOCARDIOGRAM (TEE);  Surgeon: Larey Dresser, MD;  Location: [place];  Service: Cardiovascular;  Laterality: N/A;  . TEE WITHOUT CARDIOVERSION N/A 11/24/2015   Procedure: TRANSESOPHAGEAL ECHOCARDIOGRAM (TEE);  Surgeon: Josue Hector, MD;  Location: [facility] ENDOSCOPY;  Service: Cardiovascular;  Laterality: N/A;  . XI ROBOTIC ASSISTED COLOSTOMY TAKEDOWN N/A 03/07/2019   Procedure: XI ROBOTIC ASSISTED LOW ANTERIOR RESECTION, RIGID PROCTOSCOPY;  Surgeon: Michael Boston, MD;  [facility];  Service: General;  Laterality: N/A;       Family History  Problem Relation Age of Onset  . Prostate cancer Father   . Colon cancer Father   . Skin cancer Mother   . Cancer Brother   . Esophageal cancer Neg Hx   . Rectal cancer Neg Hx   . Stomach cancer Neg Hx     Social History   Tobacco Use  . Smoking status: Current Every Day Smoker    Packs/day: 0.50    Years: 20.00    Pack years: 10.00    Types: Cigarettes  . Smokeless tobacco: Never Used  . Tobacco comment: 1/2 pack per day  Substance Use Topics  . Alcohol use: Yes     Alcohol/week: 12.0 standard drinks    Types: 12 Cans of beer per week    Comment: couple of beers each day  . Drug use: No    Home Medications Prior to Admission medications   Medication Sig Start Date End Date Taking? Authorizing Provider  atorvastatin (LIPITOR) 40 MG tablet Take 1 tablet (40 mg total) by mouth daily. 03/18/19  Yes Lenze, Michele M, PA-C  ELIQUIS 5 MG TABS tablet TAKE 1 TABLET TWICE A DAY Patient taking differently: Take 5 mg by mouth 2 (two) times daily.  09/01/19  Yes Turner, Eber Hong, MD  Ibuprofen (ADVIL) 200 MG CAPS Take 400 mg by mouth daily as needed (Pain). PRN OTC    Yes [provider]  levothyroxine (SYNTHROID) 112 MCG tablet Take 1 tablet (112 mcg total) by mouth daily. 04/17/19  Yes Burchette, Alinda Sierras, MD  nicotine (NICODERM CQ) 14 mg/24hr patch After 21mg  patches are complete, apply 14mg  patch one daily for 2 weeks. Remove old patch before applying new one. Patient not taking:  Reported on 05/20/2019 03/21/19   Imogene Burn, PA-C    Allergies    Patient has no known allergies.  Review of Systems   Review of Systems  Constitutional: Negative for chills, fatigue and fever.  HENT: Negative for hearing loss, nosebleeds and tinnitus.        Facial pain  Eyes: Negative for photophobia and visual disturbance.  Respiratory: Negative for cough and shortness of breath.   Cardiovascular: Negative for chest pain and palpitations.  Gastrointestinal: Negative for abdominal pain.  Musculoskeletal: Negative for arthralgias, back pain, myalgias and neck pain.  Skin:       Abrasion over R eyebrow  Neurological: Negative for dizziness, syncope, weakness, light-headedness and headaches.  Psychiatric/Behavioral: Negative for confusion.    Physical Exam Updated Vital Signs BP 131/83   Pulse 76   Temp 98 F (36.7 C) (Oral)   Resp 18   Ht 5\' 8"  (1.727 m)   Wt 58.1 kg   SpO2 97%   BMI 19.46 kg/m   Physical Exam Vitals and nursing [name] reviewed.    Constitutional:      General: He is not in acute distress.    Appearance: He is not ill-appearing.     Comments: Pt is alert, up out of bed, and ambulating around the exam room.  HENT:     Head: Normocephalic. Raccoon eyes present.      Comments: No bony deformities or tenderness around face or orbit.    Mouth/Throat:     Mouth: Mucous membranes are moist.  Eyes:     Extraocular Movements: Extraocular movements intact.     Conjunctiva/sclera: Conjunctivae normal.     Pupils: Pupils are equal, round, and reactive to light.  Cardiovascular:     Rate and Rhythm: Normal rate and regular rhythm.     Heart sounds: Normal heart sounds.  Pulmonary:     Effort: Pulmonary effort is normal. No respiratory distress.     Breath sounds: Normal breath sounds.  Abdominal:     General: Abdomen is flat.     Palpations: Abdomen is soft.     Tenderness: There is no abdominal tenderness.  Musculoskeletal:        General: No deformity. Normal range of motion.     Cervical back: Normal range of motion and neck supple. No tenderness.     Right lower leg: No edema.     Left lower leg: No edema.  Skin:    General: Skin is warm and dry.  Neurological:     General: No focal deficit present.     Mental Status: He is alert and oriented to person, place, and time. Mental status is at baseline.     Cranial Nerves: No cranial nerve deficit.     Sensory: No sensory deficit.     Motor: No weakness.     Gait: Gait normal.  Psychiatric:        Mood and Affect: Mood normal.    ED Results / Procedures / Treatments   Labs (all labs ordered are listed, but only abnormal results are displayed) Labs Reviewed - No data to display  EKG None  Radiology CT Head Wo Contrast  Result Date: 09/30/2019 CLINICAL DATA:  Facial trauma; head trauma, headache. Additional history provided: Patient reports fall on Wednesday due slipping on ice, hit head on cement without reported loss of consciousness, reported stroke 6  months ago, currently on blood thinner EXAM: CT HEAD WITHOUT CONTRAST TECHNIQUE: Contiguous axial images were obtained  from the base of the skull through the vertex without intravenous contrast. COMPARISON:  Brain MRI 03/09/2019 FINDINGS: Brain: There is no evidence of acute intracranial hemorrhage. No acute demarcated cortical infarct. No evidence of intracranial mass. No midline shift or extra-axial fluid collection. Redemonstrated, now chronic, cortically based infarct centered within the left frontal operculum and left insula. An additional tiny chronic cortical infarct within the left parietal lobe was better appreciated on prior MRI. Cerebral volume is normal for age. Vascular: No hyperdense vessel.  Atherosclerotic calcifications. Skull: Normal. Negative for fracture or focal lesion. Sinuses/Orbits: Visualized orbits demonstrate no acute abnormality. Redemonstrated chronic right frontoethmoidal sinus opacification. No significant mastoid effusion. IMPRESSION: No evidence of acute intracranial abnormality. Chronic infarcts within left cerebral hemisphere as described. Redemonstrated chronic right frontoethmoidal sinus opacification. If clinical concern for acute maxillofacial fracture, consider maxillofacial CT for further evaluation. Electronically Signed   By: Kellie Simmering DO   On: 09/30/2019 10:42    Procedures Procedures (including critical care time)  Medications Ordered in ED Medications - No data to display  ED Course  I have reviewed the triage vital signs and the nursing notes.  Pertinent labs & imaging results that were available during my care of the patient were reviewed by me and considered in my medical decision making (see chart for details).    MDM Rules/Calculators/A&P                      Adrian [name] is a 64 y.o. male with significant PMH of CVA in 03/2019 and afib on Eliquis, who presents after a mechanical fall. No loss of consciousness. Pt with a history of stroke and is at  his baseline today without a focal neurologic deficit. Non-contrast CT without evidence of bleed or trauma in the head. Low suspicion for underlying bony/facial trauma given pt is without gross deformity or cranial nerve palsy. Will discharge pt home from ED today with recommended follow-up with his PCP as needed.  Final Clinical Impression(s) / ED Diagnoses Final diagnoses:  Fall, initial encounter  Contusion of face, initial encounter    Rx / DC Orders ED Discharge Orders    None       Ladona Horns, MD 09/30/19 Howard, [place], DO 09/30/19 1128

## 2019-12-15 NOTE — Progress Notes (Signed)
Virtual Visit via Telephone Note   This visit type was conducted due to national recommendations for restrictions regarding the COVID-19 Pandemic (e.g. social distancing) in an effort to limit this patient's exposure and mitigate transmission in our community.  Due to his co-morbid illnesses, this patient is at least at moderate risk for complications without adequate follow up.  This format is felt to be most appropriate for this patient at this time.  The patient did not have access to video technology/had technical difficulties with video requiring transitioning to audio format only (telephone).  All issues noted in this document were discussed and addressed.  No physical exam could be performed with this format.  Please refer to the patient's chart for his  consent to telehealth for [facility].   The patient was identified using 2 identifiers.  Date:  12/16/2019   ID:  Adrian [name], DOB 24-May-1956, MRN [id number]  Patient Location: Home Provider Location: Home  PCP:  [name], MD  Cardiologist:  Fransico Him, MD  Electrophysiologist:  None   Evaluation Performed:  Follow-Up Visit  Chief Complaint: follow up  History of Present Illness:    Adrian [name] is a 64 y.o. male with a history of atrial flutter in the setting of acute diverticulitis, PAF on Eliquis CHA2DS2-VASc equals 2, nonischemic cardiomyopathy LVEF 30 to 35% and nonobstructive CAD on cath in 2017, follow-up cath 02/2016 normal LVEF 55 to 60%.  Patient underwent robotic resection of diverticulitis 02/2019 and 2 days later suffered a middle cerebral artery embolic stroke, LICA possible chronic occlusion not amendable to stent.  Last saw Dr. [name] 05/2019 and was doing well.  No changes made.  Patient says he's overall doing well. Lost a lot of weight after his stroke and hasn't been able to gain it back. Also fatigue. Works for [employer] and walks a lot. Denies chest pain, dyspnea, dizziness, or presyncope. Received  both covid19 vaccines. Smokes 1/2 ppd.  The patient does not have symptoms concerning for COVID-19 infection (fever, chills, cough, or new shortness of breath).    Past Medical History:  Diagnosis Date  . AF (paroxysmal atrial fibrillation) ([place]) 11/29/2018  . Atrial flutter ([place]) 10/12/2015  . CAD (coronary artery disease), native coronary artery 12/06/2015   a. 10/2015 MV: EF  37%, reversible defect inferior apex, intermediate risk findings; b. 10/2015 Cath: 20% mid RCA.  . Colonic diverticular abscess   . Diverticulitis   . History of chemotherapy 2005   Cisplatin  . Hypothyroidism   . NICM (nonischemic cardiomyopathy) ([place]) 10/14/2015   a. Tachy mediated?;  b. Echo 3/17 - Mild concentric LVH, EF 30-35%, anteroseptal, anterior, anterolateral, apical anterior, lateral hypokinesis, trivial MR, mild to moderately reduced RVSF; c. LHC 3/17 - mRCA 20%  . Paroxysmal atrial flutter ([place])    a. TEE 3/17 with ? LAA clot-->s/p TEE/DCCV 11/24/2015  . Radiation NOv.3,2005-Dec. 15, 2005   6810 cGy in 30 fractions  . 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  XRT   Past Surgical History:  Procedure Laterality Date  . APPENDECTOMY N/A 03/07/2019   Procedure: ROBOTIC ASSISTED APPENDECTOMY;  Surgeon: Michael Boston, MD;  [facility];  Service: General;  Laterality: N/A;  . CARDIAC CATHETERIZATION N/A 10/18/2015   Procedure: Left Heart Cath and Coronary Angiography;  Surgeon: Burnell Blanks, MD;  Location: [place] CV LAB;  Service: Cardiovascular;  Laterality: N/A;  . CARDIOVERSION N/A 11/24/2015   Procedure: CARDIOVERSION;  Surgeon: Josue Hector, MD;  [facility]OS[facility];  Service: Cardiovascular;  Laterality: N/A;  . CYSTOSCOPY WITH STENT PLACEMENT Bilateral 03/07/2019   Procedure: CYSTOSCOPY WITH BILATERAL FIREFLY INJECTION;  Surgeon: Ardis Hughs, MD;  [facility];  Service: Urology;  Laterality: Bilateral;  . GASTROSTOMY TUBE PLACEMENT  07/04/2004   IR - G tube for  tonsilar cancer  . IR ANGIO INTRA EXTRACRAN SEL COM CAROTID INNOMINATE UNI R MOD SED  03/09/2019  . IR ANGIO VERTEBRAL SEL VERTEBRAL UNI R MOD SED  03/09/2019  . IR CT HEAD LTD  03/09/2019  . IR PERCUTANEOUS ART THROMBECTOMY/INFUSION INTRACRANIAL INC DIAG ANGIO  03/09/2019  . IR RADIOLOGIST EVAL & MGMT  12/18/2018  . LAMINECTOMY     C5/placement of steel plate  . NECK SURGERY  2003   replaced disk  . RADIOLOGY WITH ANESTHESIA N/A 03/08/2019   Procedure: IR WITH ANESTHESIA;  Surgeon: Luanne Bras, MD;  Location: [place];  Service: Radiology;  Laterality: N/A;  . TEE WITHOUT CARDIOVERSION N/A 10/15/2015   Procedure: TRANSESOPHAGEAL ECHOCARDIOGRAM (TEE);  Surgeon: Larey Dresser, MD;  Location: [place];  Service: Cardiovascular;  Laterality: N/A;  . TEE WITHOUT CARDIOVERSION N/A 11/24/2015   Procedure: TRANSESOPHAGEAL ECHOCARDIOGRAM (TEE);  Surgeon: Josue Hector, MD;  Location: [place];  Service: Cardiovascular;  Laterality: N/A;  . XI ROBOTIC ASSISTED COLOSTOMY TAKEDOWN N/A 03/07/2019   Procedure: XI ROBOTIC ASSISTED LOW ANTERIOR RESECTION, RIGID PROCTOSCOPY;  Surgeon: Michael Boston, MD;  [facility];  Service: General;  Laterality: N/A;     Current Meds  Medication Sig  . atorvastatin (LIPITOR) 40 MG tablet Take 1 tablet (40 mg total) by mouth daily.  Marland Kitchen ELIQUIS 5 MG TABS tablet TAKE 1 TABLET TWICE A DAY (Patient taking differently: Take 5 mg by mouth 2 (two) times daily. )  . Ibuprofen (ADVIL) 200 MG CAPS Take 400 mg by mouth daily as needed (Pain). PRN OTC   . levothyroxine (SYNTHROID) 112 MCG tablet Take 1 tablet (112 mcg total) by mouth daily.     Allergies:   Patient has no known allergies.   Social History   Tobacco Use  . Smoking status: Current Every Day Smoker    Packs/day: 0.50    Years: 20.00    Pack years: 10.00    Types: Cigarettes  . Smokeless tobacco: Never Used  . Tobacco comment: 1/2 pack per day  Substance Use Topics  . Alcohol use: Yes     Alcohol/week: 12.0 standard drinks    Types: 12 Cans of beer per week    Comment: couple of beers each day  . Drug use: No     Family Hx: The patient's family history includes Cancer in his brother; Colon cancer in his father; Prostate cancer in his father; Skin cancer in his mother. There is no history of Esophageal cancer, Rectal cancer, or Stomach cancer.  ROS:   Please see the history of present illness.      All other systems reviewed and are negative.   Prior CV studies:   The following studies were reviewed today: Echo 03/2019 [name]     1. The left ventricle has normal systolic function with an ejection  fraction of 60-65%. The cavity size was normal. Left ventricular diastolic  parameters were normal.   2. The right ventricle has normal systolic function. The cavity was  normal. There is no increase in right ventricular wall thickness.   3. No evidence of mitral valve stenosis.   4. The tricuspid valve is  grossly normal.   5. No stenosis of the aortic valve.   6. The aorta is normal in size and structure.   7. The aortic root and ascending aorta are normal in size and structure.   8. The interatrial septum was not assessed.   FINDINGS   Left Ventricle: The left ventricle has normal systolic function, with an  ejection fraction of 60-65%. The cavity size was normal. There is no  increase in left ventricular wall thickness. Left ventricular diastolic  parameters were normal.   Right Ventricle: The right ventricle has normal systolic function. The  cavity was normal. There is no increase in right ventricular wall  thickness.   Left Atrium: Left atrial size was normal in size.   Right Atrium: Right atrial size was normal in size. Right atrial pressure  is estimated at 10 mmHg.   Interatrial Septum: The interatrial septum was not assessed.   Pericardium: There is no evidence of pericardial effusion.   Mitral Valve: The mitral valve is normal in structure.  Mitral valve  regurgitation is trivial by color flow Doppler. No evidence of mitral  valve stenosis.   Tricuspid Valve: The tricuspid valve is grossly normal. Tricuspid valve  regurgitation was not visualized by color flow Doppler.   Aortic Valve: The aortic valve is normal in structure. Aortic valve  regurgitation was not visualized by color flow Doppler. There is No  stenosis of the aortic valve.   Pulmonic Valve: The pulmonic valve was grossly normal. Pulmonic valve  regurgitation is not visualized by color flow Doppler.   Aorta: The aortic root and ascending aorta are normal in size and  structure. The aorta is normal in size and structure.         Labs/Other Tests and Data Reviewed:    EKG:  No ECG reviewed.  Recent Labs: 03/14/2019: Magnesium 2.0 03/15/2019: BUN 12; Creatinine, Ser 0.51; Potassium 3.6; Sodium 131 04/04/2019: Hemoglobin 12.6; Platelets 211.0 04/11/2019: TSH 0.33   Recent Lipid Panel Lab Results  Component Value Date/Time   CHOL 110 03/09/2019 06:56 AM   CHOL 159 03/09/2017 10:32 AM   TRIG 97 03/09/2019 06:56 AM   HDL 45 03/09/2019 06:56 AM   HDL 61 03/09/2017 10:32 AM   CHOLHDL 2.4 03/09/2019 06:56 AM   LDLCALC 46 03/09/2019 06:56 AM   LDLCALC 66 03/09/2017 10:32 AM   LDLDIRECT 143.3 06/20/2012 11:48 AM    Wt Readings from Last 3 Encounters:  12/16/19 128 lb (58.1 kg)  09/30/19 128 lb (58.1 kg)  07/15/19 130 lb 6.4 oz (59.1 kg)     Objective:    Vital Signs:  BP (!) 142/78   Pulse 81   Ht 5\' 8"  (1.727 m)   Wt 128 lb (58.1 kg)   BMI 19.46 kg/m    VITAL SIGNS:  reviewed  ASSESSMENT & PLAN:    PAF status post DCCV 11/2015 on Eliquis-no bleeding problems. Will check CBC and CMET  History of nonischemic cardiomyopathy felt to be tachycardia mediated resolved.  Most recent echo 03/2019 LVEF 60 to 65%, no CAD on cath in 2017  Mild nonobstructive CAD on cath in 2017-no chest pain  Hyperlipidemia LDL 46 03/2019 check FLP  History of CVA 2  days after robotic surgery for [id number] LICA possible chronic occlusion not amendable to stent or surgery  Tobacco abuse -still smoking-has nicotine patches but hasn't used. Enjoys smoking. Smoking cessation discussed-last CXR 2017 -recommend yearly CT but patient wants to check with his insurance first.  HTN-up a little today but usually 132/70. Not on meds. Patient will continue to monitor  Hypothyroidism. TSH low in Sept. Will check  COVID-19 Education: The signs and symptoms of COVID-19 were discussed with the patient and how to seek care for testing (follow up with PCP or arrange E-visit).  The importance of social distancing was discussed today.  Time:   Today, I have spent 14:45 minutes with the patient with telehealth technology discussing the above problems.     Medication Adjustments/Labs and Tests Ordered: Current medicines are reviewed at length with the patient today.  Concerns regarding medicines are outlined above.   Tests Ordered: Orders Placed This Encounter  Procedures  . Comprehensive metabolic panel  . CBC  . Lipid panel  . TSH    Medication Changes: No orders of the defined types were placed in this encounter.   Follow Up:  Either In Person or Virtual in 6 month(s) Dr. [name]  Signed, Ermalinda Barrios, PA-C  12/16/2019 8:53 AM    [place]

## 2019-12-16 ENCOUNTER — Telehealth (INDEPENDENT_AMBULATORY_CARE_PROVIDER_SITE_OTHER): Payer: BC Managed Care – PPO | Admitting: Physician Assistant

## 2019-12-16 ENCOUNTER — Encounter: Payer: Self-pay | Admitting: Physician Assistant

## 2019-12-16 ENCOUNTER — Other Ambulatory Visit: Payer: Self-pay

## 2019-12-16 VITALS — BP 142/78 | HR 81 | Ht 68.0 in | Wt 128.0 lb

## 2019-12-16 DIAGNOSIS — E785 Hyperlipidemia, unspecified: Secondary | ICD-10-CM

## 2019-12-16 DIAGNOSIS — Z8673 Personal history of transient ischemic attack (TIA), and cerebral infarction without residual deficits: Secondary | ICD-10-CM

## 2019-12-16 DIAGNOSIS — E039 Hypothyroidism, unspecified: Secondary | ICD-10-CM

## 2019-12-16 DIAGNOSIS — I2510 Atherosclerotic heart disease of native coronary artery without angina pectoris: Secondary | ICD-10-CM | POA: Diagnosis not present

## 2019-12-16 DIAGNOSIS — I428 Other cardiomyopathies: Secondary | ICD-10-CM

## 2019-12-16 DIAGNOSIS — I480 Paroxysmal atrial fibrillation: Secondary | ICD-10-CM | POA: Diagnosis not present

## 2019-12-16 NOTE — Patient Instructions (Signed)
Medication Instructions:  Your physician recommends that you continue on your current medications as directed. Please refer to the Current Medication list given to you today.  *If you need a refill on your cardiac medications before your next appointment, please call your pharmacy*   Lab Work: Your physician recommends that you return for a FASTING LIPIDS, CMET, CBC, and TSH  If you have labs (blood work) drawn today and your tests are completely normal, you will receive your results only by: Marland Kitchen MyChart Message (if you have MyChart) OR . A paper copy in the mail If you have any lab test that is abnormal or we need to change your treatment, we will call you to review the results.   Testing/Procedures: None ordered   Follow-Up: At [facility], you and your health needs are our priority.  As part of our continuing mission to provide you with exceptional heart care, we have created designated Provider Care Teams.  These Care Teams include your primary Cardiologist (physician) and Advanced Practice Providers (APPs -  Physician Assistants and Nurse Practitioners) who all work together to provide you with the care you need, when you need it.  We recommend signing up for the patient portal called "MyChart".  Sign up information is provided on this After Visit Summary.  MyChart is used to connect with patients for Virtual Visits (Telemedicine).  Patients are able to view lab/test results, encounter notes, upcoming appointments, etc.  Non-urgent messages can be sent to your provider as well.   To learn more about what you can do with MyChart, go to [email or web address].    Your next appointment:   6 month(s)  The format for your next appointment:   Either In Person or Virtual  Provider:   You may see Fransico Him, MD or one of the following Advanced Practice Providers on your designated Care Team:    Melina Copa, PA-C  Ermalinda Barrios, PA-C    Other Instructions  Steps to Quit  Smoking Smoking tobacco is the leading cause of preventable death. It can affect almost every organ in the body. Smoking puts you and people around you at risk for many serious, long-lasting (chronic) diseases. Quitting smoking can be hard, but it is one of the best things that you can do for your health. It is never too late to quit. How do I get ready to quit? When you decide to quit smoking, make a plan to help you succeed. Before you quit:  Pick a date to quit. Set a date within the next 2 weeks to give you time to prepare.  Write down the reasons why you are quitting. Keep this list in places where you will see it often.  Tell your family, friends, and co-workers that you are quitting. Their support is important.  Talk with your doctor about the choices that may help you quit.  Find out if your health insurance will pay for these treatments.  Know the people, places, things, and activities that make you want to smoke (triggers). Avoid them. What first steps can I take to quit smoking?  Throw away all cigarettes at home, at work, and in your car.  Throw away the things that you use when you smoke, such as ashtrays and lighters.  Clean your car. Make sure to empty the ashtray.  Clean your home, including curtains and carpets. What can I do to help me quit smoking? Talk with your doctor about taking medicines and seeing a counselor at the same  time. You are more likely to succeed when you do both.  If you are pregnant or breastfeeding, talk with your doctor about counseling or other ways to quit smoking. Do not take medicine to help you quit smoking unless your doctor tells you to do so. To quit smoking: Quit right away  Quit smoking totally, instead of slowly cutting back on how much you smoke over a period of time.  Go to counseling. You are more likely to quit if you go to counseling sessions regularly. Take medicine You may take medicines to help you quit. Some medicines  need a prescription, and some you can buy over-the-counter. Some medicines may contain a drug called nicotine to replace the nicotine in cigarettes. Medicines may:  Help you to stop having the desire to smoke (cravings).  Help to stop the problems that come when you stop smoking (withdrawal symptoms). Your doctor may ask you to use:  Nicotine patches, gum, or lozenges.  Nicotine inhalers or sprays.  Non-nicotine medicine that is taken by mouth. Find resources Find resources and other ways to help you quit smoking and remain smoke-free after you quit. These resources are most helpful when you use them often. They include:  Online chats with a Social worker.  Phone quitlines.  Printed Furniture conservator/restorer.  Support groups or group counseling.  Text messaging programs.  Mobile phone apps. Use apps on your mobile phone or tablet that can help you stick to your quit plan. There are many free apps for mobile phones and tablets as well as websites. Examples include Quit Guide from the [employer] and smokefree.gov  What things can I do to make it easier to quit?   Talk to your family and friends. Ask them to support and encourage you.  Call a phone quitline (1-800-QUIT-NOW), reach out to support groups, or work with a Social worker.  Ask people who smoke to not smoke around you.  Avoid places that make you want to smoke, such as: ? Bars. ? Parties. ? Smoke-break areas at work.  Spend time with people who do not smoke.  Lower the stress in your life. Stress can make you want to smoke. Try these things to help your stress: ? Getting regular exercise. ? Doing deep-breathing exercises. ? Doing yoga. ? Meditating. ? Doing a body scan. To do this, close your eyes, focus on one area of your body at a time from head to toe. Notice which parts of your body are tense. Try to relax the muscles in those areas. How will I feel when I quit smoking? Day 1 to 3 weeks Within the first 24 hours, you may  start to have some problems that come from quitting tobacco. These problems are very bad 2-3 days after you quit, but they do not often last for more than 2-3 weeks. You may get these symptoms:  Mood swings.  Feeling restless, nervous, angry, or annoyed.  Trouble concentrating.  Dizziness.  Strong desire for high-sugar foods and nicotine.  Weight gain.  Trouble pooping (constipation).  Feeling like you may vomit (nausea).  Coughing or a sore throat.  Changes in how the medicines that you take for other issues work in your body.  Depression.  Trouble sleeping (insomnia). Week 3 and afterward After the first 2-3 weeks of quitting, you may start to notice more positive results, such as:  Better sense of smell and taste.  Less coughing and sore throat.  Slower heart rate.  Lower blood pressure.  Clearer skin.  Better breathing.  Fewer sick days. Quitting smoking can be hard. Do not give up if you fail the first time. Some people need to try a few times before they succeed. Do your best to stick to your quit plan, and talk with your doctor if you have any questions or concerns. Summary  Smoking tobacco is the leading cause of preventable death. Quitting smoking can be hard, but it is one of the best things that you can do for your health.  When you decide to quit smoking, make a plan to help you succeed.  Quit smoking right away, not slowly over a period of time.  When you start quitting, seek help from your doctor, family, or friends. This information is not intended to replace advice given to you by your health care provider. Make sure you discuss any questions you have with your health care provider. Document Revised: 04/18/2019 Document Reviewed: 10/12/2018 Elsevier Patient Education  [place].

## 2019-12-19 ENCOUNTER — Other Ambulatory Visit: Payer: Self-pay

## 2019-12-19 ENCOUNTER — Other Ambulatory Visit: Payer: BC Managed Care – PPO | Admitting: *Deleted

## 2019-12-19 DIAGNOSIS — Z8673 Personal history of transient ischemic attack (TIA), and cerebral infarction without residual deficits: Secondary | ICD-10-CM

## 2019-12-19 DIAGNOSIS — I480 Paroxysmal atrial fibrillation: Secondary | ICD-10-CM

## 2019-12-19 DIAGNOSIS — I428 Other cardiomyopathies: Secondary | ICD-10-CM

## 2019-12-19 DIAGNOSIS — I2510 Atherosclerotic heart disease of native coronary artery without angina pectoris: Secondary | ICD-10-CM

## 2019-12-19 DIAGNOSIS — E785 Hyperlipidemia, unspecified: Secondary | ICD-10-CM

## 2019-12-19 DIAGNOSIS — E039 Hypothyroidism, unspecified: Secondary | ICD-10-CM

## 2019-12-19 LAB — COMPREHENSIVE METABOLIC PANEL
ALT: 13 IU/L (ref 0–44)
AST: 20 IU/L (ref 0–40)
Albumin/Globulin Ratio: 1 — ABNORMAL LOW (ref 1.2–2.2)
Albumin: 3.7 g/dL — ABNORMAL LOW (ref 3.8–4.8)
Alkaline Phosphatase: 109 IU/L (ref 39–117)
BUN/Creatinine Ratio: 14 (ref 10–24)
BUN: 11 mg/dL (ref 8–27)
Bilirubin Total: 0.7 mg/dL (ref 0.0–1.2)
CO2: 24 mmol/L (ref 20–29)
Calcium: 9.2 mg/dL (ref 8.6–10.2)
Chloride: 100 mmol/L (ref 96–106)
Creatinine, Ser: 0.76 mg/dL (ref 0.76–1.27)
GFR calc Af Amer: 111 mL/min/{1.73_m2} (ref >59)
GFR calc non Af Amer: 96 mL/min/{1.73_m2} (ref >59)
Globulin, Total: 3.7 g/dL (ref 1.5–4.5)
Glucose: 94 mg/dL (ref 65–99)
Potassium: 4.1 mmol/L (ref 3.5–5.2)
Sodium: 137 mmol/L (ref 134–144)
Total Protein: 7.4 g/dL (ref 6.0–8.5)

## 2019-12-19 LAB — LIPID PANEL
Chol/HDL Ratio: 2.2 ratio (ref 0.0–5.0)
Cholesterol, Total: 121 mg/dL (ref 100–199)
HDL: 54 mg/dL (ref >39)
LDL Chol Calc (NIH): 50 mg/dL (ref 0–99)
Triglycerides: 90 mg/dL (ref 0–149)
VLDL Cholesterol Cal: 17 mg/dL (ref 5–40)

## 2019-12-19 LAB — CBC
Hematocrit: 40.8 % (ref 37.5–51.0)
Hemoglobin: 14.1 g/dL (ref 13.0–17.7)
MCH: 30.9 pg (ref 26.6–33.0)
MCHC: 34.6 g/dL (ref 31.5–35.7)
MCV: 89 fL (ref 79–97)
Platelets: 225 10*3/uL (ref 150–450)
RBC: 4.57 x10E6/uL (ref 4.14–5.80)
RDW: 15 % (ref 11.6–15.4)
WBC: 2.9 10*3/uL — ABNORMAL LOW (ref 3.4–10.8)

## 2019-12-19 LAB — TSH: TSH: 1.4 u[IU]/mL (ref 0.450–4.500)

## 2020-01-14 ENCOUNTER — Encounter: Payer: Self-pay | Admitting: Adult Health

## 2020-01-14 ENCOUNTER — Ambulatory Visit: Payer: BC Managed Care – PPO | Admitting: Adult Health

## 2020-01-14 VITALS — BP 134/75 | HR 71 | Ht 68.0 in | Wt 128.0 lb

## 2020-01-14 DIAGNOSIS — I480 Paroxysmal atrial fibrillation: Secondary | ICD-10-CM

## 2020-01-14 DIAGNOSIS — I63411 Cerebral infarction due to embolism of right middle cerebral artery: Secondary | ICD-10-CM | POA: Diagnosis not present

## 2020-01-14 DIAGNOSIS — I10 Essential (primary) hypertension: Secondary | ICD-10-CM | POA: Diagnosis not present

## 2020-01-14 DIAGNOSIS — E785 Hyperlipidemia, unspecified: Secondary | ICD-10-CM

## 2020-01-14 NOTE — Patient Instructions (Signed)
Continue Eliquis (apixaban) daily  and atorvastatin  for secondary stroke prevention  Continue to follow up with PCP regarding cholesterol and blood pressure management   Continue to follow with cardiology for atrial fibrillation management   Continue to monitor blood pressure at home  Maintain strict control of hypertension with blood pressure goal below 130/90, diabetes with hemoglobin A1c goal below 6.5% and cholesterol with LDL cholesterol (bad cholesterol) goal below 70 mg/dL. I also advised the patient to eat a healthy diet with plenty of whole grains, cereals, fruits and vegetables, exercise regularly and maintain ideal body weight.  As you have been doing well from a stroke standpoint, you may follow up as needed at this time      Thank you for coming to see [place] at [facility]. I hope we have been able to provide you high quality care today.  You may receive a patient satisfaction survey over the next few weeks. We would appreciate your feedback and comments so that we may continue to improve ourselves and the health of our patients.

## 2020-01-14 NOTE — Progress Notes (Signed)
[facility] [address] [place]. [place] [address] [phone]       OFFICE FOLLOW-UP NOTE  Adrian [name] Date of Birth:  1956/03/01 Medical Record Number:  [id number]    Chief complaint: Chief Complaint  Patient presents with  . Follow-up    845 here in tx rm for a stroke f/u. Pt is having no new sx     HPI:   Today, 01/15/2020, Adrian [name] is being seen for follow-up regarding left MCA stroke in 03/2019.  He has been doing well since prior visit with improvement of his speech and only occasional word finding difficulty.  Denies new or worsening stroke/TIA symptoms.  He continues to work for [employer] without difficulty.  Continues on Eliquis for secondary stroke prevention and atrial fibrillation as well as atorvastatin without side effects. Recent lab work showed LDL 50. Blood pressure today 134/75.  He continues to follow closely with PCP and cardiology. No stroke or neurological concerns at this time.   History provided for reference purposes only Update 07/15/2019 JM: Adrian [name] is a 64 year old male who is being seen today for stroke follow-up.  Residual deficits occasional speech hesitancy or difficulty finding the correct word but overall improving.  He has also continued to experience decreased energy levels and decreased motivation but this has been slightly improving.  He has returned back to work without great difficulty working 7 to 8 hours/day.  He denies insomnia or snoring.  He has actually been working on weight gain and has gained 5 pounds since prior visit.  Continues on Eliquis for history of atrial fibrillation and secondary stroke prevention without bleeding or bruising.  Continues on atorvastatin without myalgias.  Blood pressure today 122/78.  No further concerns at this time.  Initial visit 04/15/2019 Dr. Leonie Man: Adrian [name] is a 64 year old Caucasian male seen today for initial office follow-up visit following hospital admission for stroke in August 2020.   History is obtained from the patient, his ex wife, review of electronic medical records and have personally reviewed imaging films in PACS.  He has past medical history of coronary artery disease, tonsillar cancer in remission, atrial fibrillation on anticoagulation which was held 5 days ago prior to admission for diverticulitis of large intestine with abscess requiring exploratory laparotomy with low anterior resection, rigid proctoscopy and appendicectomy.  He was last seen normal on 03/08/2019 at 7:30 PM and was still on hospital and he developed sudden onset of speech difficulties and speech became nonsensical he had trouble expressing himself.  Code stroke was activated his NIH stroke scale was 2 on assessment there.  Noncontrast CT scan of the head was negative for bleed TPA was not given due to recent surgery 1 day prior.  CT angiogram of the head and neck was obtained which showed left cervical carotid occlusion as well as left MCA occlusion and CT perfusion showed acute cord infarct involving the anterior left MCA distribution with moderate surrounding ischemic penumbra favorable for revascularization.  Patient was transferred to [facility] where he underwent mechanical thrombectomy of the occluded left ICA with revascularization but only partial revascularization of the left MCA.  MRI scan of the brain showed moderate size left MCA infarct with trace petechial hemorrhage with decreased left ICA flow void.  2D echo showed normal ejection fraction.  LDL cholesterol 46 mg percent.  Hemoglobin A1c was 5.5.  Patient is started on Eliquis for his history of atrial fibrillation.  He was discharged home with outpatient physical  occupational and speech therapy.   Patient states he is continuing therapies but is currently getting only speech and occupational therapy 1 day a week.  He still has some word finding difficulties particularly towards afternoon when he is tired he struggles to speak.  He is  currently out on short-term disability to October 23 from his diverticulitis.  He wants to go back to work and will be working part-time 6 hours/day.  He does not involve a lot of speaking and talking in his job.  He is tolerating Eliquis well without bruising or bleeding.  His blood pressure is well controlled and today it is 124/74.  He is tolerating Lipitor without muscle aches and pains.  Patient continues to smoke but states he is willing to quit soon.  He wants to return back to work on 05/25/2019.    ROS:   14 system review of systems is positive for speech difficulties and all other systems negative  PMH:  Past Medical History:  Diagnosis Date  . AF (paroxysmal atrial fibrillation) ([place]) 11/29/2018  . Atrial flutter ([place]) 10/12/2015  . CAD (coronary artery disease), native coronary artery 12/06/2015   a. 10/2015 MV: EF  37%, reversible defect inferior apex, intermediate risk findings; b. 10/2015 Cath: 20% mid RCA.  . Colonic diverticular abscess   . Diverticulitis   . History of chemotherapy 2005   Cisplatin  . Hypothyroidism   . NICM (nonischemic cardiomyopathy) ([place]) 10/14/2015   a. Tachy mediated?;  b. Echo 3/17 - Mild concentric LVH, EF 30-35%, anteroseptal, anterior, anterolateral, apical anterior, lateral hypokinesis, trivial MR, mild to moderately reduced RVSF; c. LHC 3/17 - mRCA 20%  . Paroxysmal atrial flutter ([place])    a. TEE 3/17 with ? LAA clot-->s/p TEE/DCCV 11/24/2015  . Radiation NOv.3,2005-Dec. 15, 2005   6810 cGy in 30 fractions  . Tonsil cancer [facility]) 2005   Dr Romeo Rabon.  XRT    Social History:  Social History   Socioeconomic History  . Marital status: Married    Spouse name: Not on file  . Number of children: Not on file  . Years of education: Not on file  . Highest education level: Not on file  Occupational History  . Not on file  Tobacco Use  . Smoking status: Current Every Day Smoker    Packs/day: 0.50    Years: 20.00    Pack years: 10.00    Types:  Cigarettes  . Smokeless tobacco: Never Used  . Tobacco comment: 1/2 pack per day  Substance and Sexual Activity  . Alcohol use: Yes    Alcohol/week: 12.0 standard drinks    Types: 12 Cans of beer per week    Comment: couple of beers each day  . Drug use: No  . Sexual activity: Not on file  Other Topics Concern  . Not on file  Social History Narrative  . Not on file   Social Determinants of Health   Financial Resource Strain:   . Difficulty of Paying Living Expenses:   Food Insecurity:   . Worried About Charity fundraiser in the Last Year:   . Arboriculturist in the Last Year:   Transportation Needs:   . Film/video editor (Medical):   Marland Kitchen Lack of Transportation (Non-Medical):   Physical Activity:   . Days of Exercise per Week:   . Minutes of Exercise per Session:   Stress:   . Feeling of Stress :   Social Connections:   . Frequency of  Communication with Friends and Family:   . Frequency of Social Gatherings with Friends and Family:   . Attends Religious Services:   . Active Member of Clubs or Organizations:   . Attends Archivist Meetings:   Marland Kitchen Marital Status:   Intimate Partner Violence:   . Fear of Current or Ex-Partner:   . Emotionally Abused:   Marland Kitchen Physically Abused:   . Sexually Abused:     Medications:   Current Outpatient Medications on File Prior to Visit  Medication Sig Dispense Refill  . atorvastatin (LIPITOR) 40 MG tablet Take 1 tablet (40 mg total) by mouth daily. 90 tablet 3  . ELIQUIS 5 MG TABS tablet TAKE 1 TABLET TWICE A DAY (Patient taking differently: Take 5 mg by mouth 2 (two) times daily. ) 180 tablet 2  . Ibuprofen (ADVIL) 200 MG CAPS Take 400 mg by mouth daily as needed (Pain). PRN OTC     . levothyroxine (SYNTHROID) 112 MCG tablet Take 1 tablet (112 mcg total) by mouth daily. 90 tablet 3  . nicotine (NICODERM CQ) 14 mg/24hr patch After 21mg  patches are complete, apply 14mg  patch one daily for 2 weeks. Remove old patch before applying  new one. 14 patch 0   No current facility-administered medications on file prior to visit.    Allergies:  No Known Allergies  Physical Exam  Today's Vitals   01/14/20 0821  BP: 134/75  Pulse: 71  Weight: 128 lb (58.1 kg)  Height: 5\' 8"  (1.727 m)   Body mass index is 19.46 kg/m.  General: well developed, well nourished, pleasant middle-age Caucasian male, seated, in no evident distress Head: head normocephalic and atraumatic.  Neck: supple with no carotid or supraclavicular bruits Cardiovascular: regular rate and rhythm, no murmurs Musculoskeletal: no deformity Skin: Linear erythematous papules left wrist and bilateral calfs (appears to be poison ivy/oak rash) Vascular:  Normal pulses all extremities  Neurologic Exam Mental Status: Awake and fully alert.  Occasional word hesitancy.  Oriented to place and time. Recent and remote memory intact. Attention span, concentration and fund of knowledge appropriate. Mood and affect appropriate.   Cranial Nerves: Pupils equal, briskly reactive to light. Extraocular movements full without nystagmus. Visual fields full to confrontation. Hearing intact. Facial sensation intact. Face, tongue, palate moves normally and symmetrically.  Motor: Normal bulk and tone. Normal strength in all tested extremity muscles. Sensory.: intact to touch ,pinprick .position and vibratory sensation.  Coordination: Rapid alternating movements normal in all extremities. Finger-to-nose and heel-to-shin performed accurately bilaterally. Gait and Station: Arises from chair without difficulty. Stance is normal. Gait demonstrates normal stride length and balance . Able to heel, toe and tandem walk without difficulty.  Reflexes: 1+ and symmetric. Toes downgoing.      ASSESSMENT: 64 year old Caucasian male with left MCA branch infarct in August 2020 secondary to left ICA occlusion treated with mechanical thrombectomy with partial revascularization.  Etiology of stroke  atrial fibrillation and patient was off anticoagulation for recent surgery and has since been restarted on Eliquis.  Vascular risk factors of smoking, hyperlipidemia and atrial fibrillation.  He has been recovering well from a stroke standpoint with residual occasional word finding difficulty.  Reports ongoing difficulty with weight gain as well as rash on left wrist and bilateral calfs likely poison oak/ivy     PLAN: -Continue Eliquis (apixaban) daily and atorvastatin for secondary stroke prevention -Continue to follow with PCP for HTN and HLD management -Continue to follow with cardiology for atrial fibrillation and Eliquis monitoring/management -  Advised to speak further with PCP in regards to weight gain as well as possible referral to nutritionist -Rash appears to be consistent with poison oak/ivy characteristics especially as he was recently working outside.  Advised to follow-up with PCP if rash becomes worse or increased itching -maintain strict control of hypertension with blood pressure goal below 130/90, diabetes with hemoglobin A1c goal below 6.5% and lipids with LDL cholesterol goal below 70 mg/dL.  Overall stable from a stroke standpoint recommend follow-up as needed   I spent 26 minutes of face-to-face and non-face-to-face time with patient.  This included previsit chart review, lab review, study review, order entry, electronic health record documentation, patient education   [name], [facility]  [facility] [address] [place], [place] [address]  Phone [phone] Fax [phone] Note: This document was prepared with digital dictation and possible smart phrase technology. Any transcriptional errors that result from this process are unintentional.

## 2020-01-19 NOTE — Progress Notes (Signed)
I agree with the above plan 

## 2020-04-05 ENCOUNTER — Other Ambulatory Visit: Payer: Self-pay | Admitting: Physician Assistant

## 2020-04-05 ENCOUNTER — Other Ambulatory Visit: Payer: Self-pay | Admitting: Family Medicine

## 2020-04-05 DIAGNOSIS — E039 Hypothyroidism, unspecified: Secondary | ICD-10-CM

## 2020-07-20 ENCOUNTER — Telehealth: Payer: Self-pay | Admitting: Cardiology

## 2020-07-20 ENCOUNTER — Other Ambulatory Visit: Payer: Self-pay | Admitting: Cardiology

## 2020-07-20 NOTE — Telephone Encounter (Signed)
Pt c/o medication issue:  1. Name of Medication: ELIQUIS 5 MG TABS tablet  2. How are you currently taking this medication (dosage and times per day)? 1 tablet twice a day  3. Are you having a reaction (difficulty breathing--STAT)? no  4. What is your medication issue? Patient's wife states the the medication needs a prior [place].

## 2020-07-20 NOTE — Telephone Encounter (Signed)
Will send to our prior [place]. Will also send as FYI to Ermalinda Barrios, [facility] covering [place] April G.

## 2020-07-20 NOTE — Telephone Encounter (Signed)
Eliquis 5mg  refill request received. Patient is 64 years old, weight-58.1kg, Crea-0.76 on 12/19/2019, Diagnosis-Afib, and last seen by Ermalinda Barrios on 12/16/2019. Dose is appropriate based on dosing criteria. Will send in refill to requested pharmacy.

## 2020-07-26 NOTE — Telephone Encounter (Signed)
LMOM regarding patient's Eliquis and whether it needs a PA

## 2020-07-26 NOTE — Telephone Encounter (Signed)
Message sent to Karren Cobble, [facility] as they are handling these request at this time.

## 2020-08-08 IMAGING — CT CT HEAD W/O CM
3 series · 15 of 47 positions shown, 18 images · non-contrast
Comparison: Brain MRI 03/09/2019

CLINICAL DATA: Facial trauma; head trauma, headache. Additional
history provided: Patient reports fall on [REDACTED] due slipping on
ice, hit head on cement without reported loss of consciousness,
reported stroke 6 months ago, currently on blood thinner

EXAM:
CT HEAD WITHOUT CONTRAST
TECHNIQUE: Contiguous axial images were obtained from the base of the skull
through the vertex without intravenous contrast.

[Series 2: head wo · axial · 0.46mm/px · z∈[-137,+3]mm · 9 of 34 slices shown, 12 images]
[im 3/34  brain]
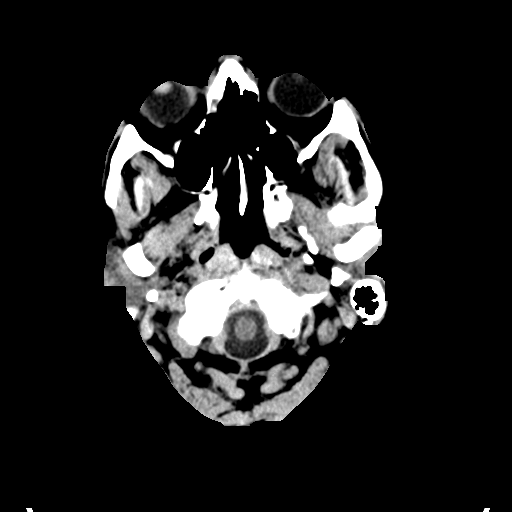
[im 3/34  bone]
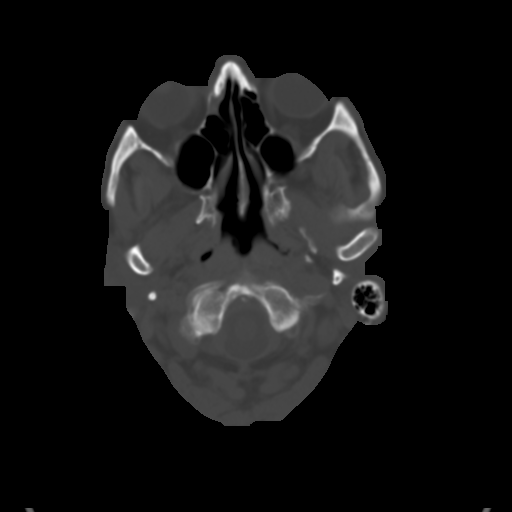
[im 6/34  brain]
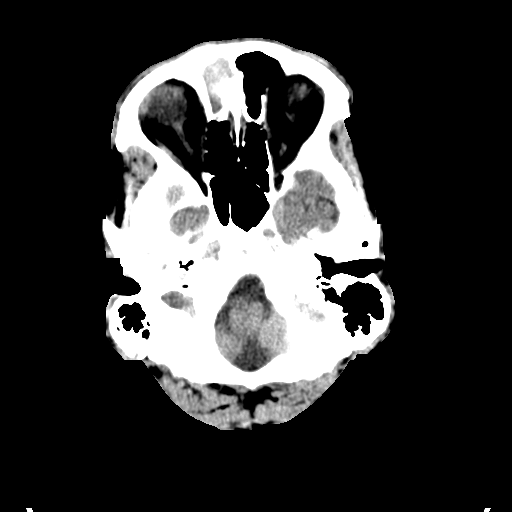
[im 10/34  brain]
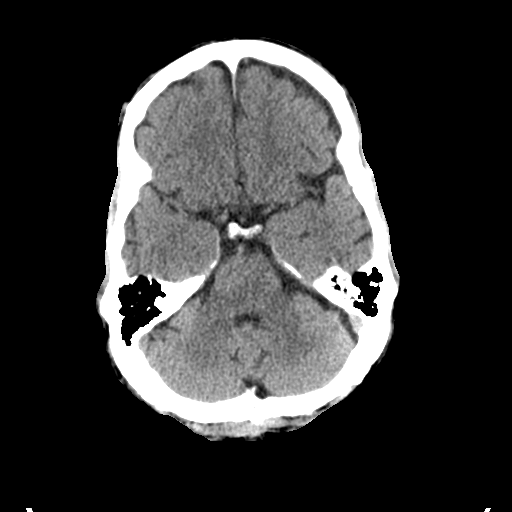
[im 13/34  brain]
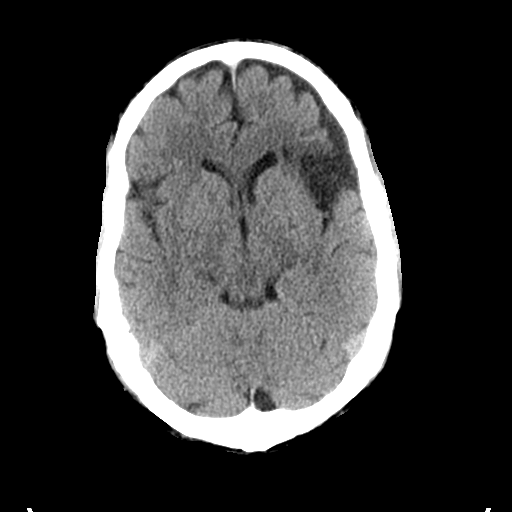
[im 18/34  brain]
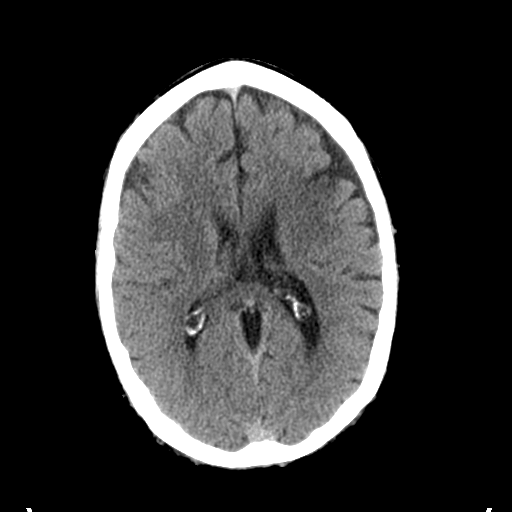
[im 18/34  bone]
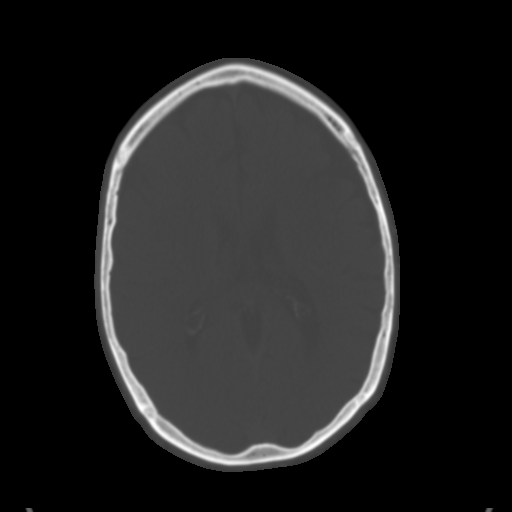
[im 21/34  brain]
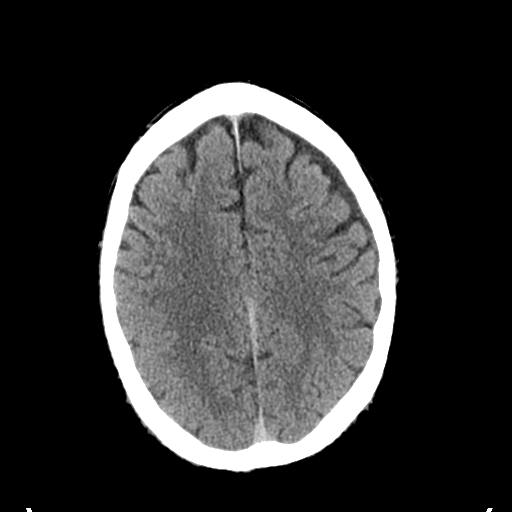
[im 24/34  brain]
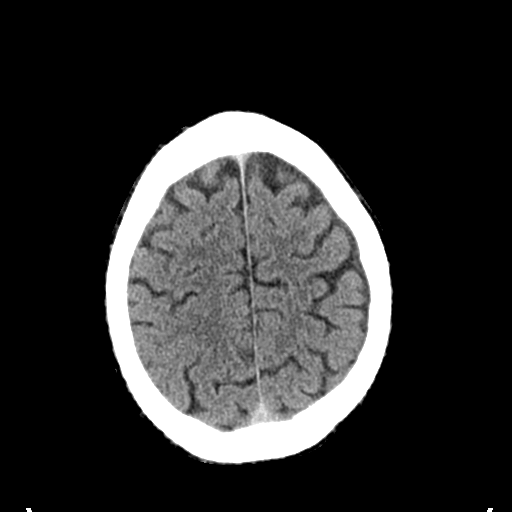
[im 28/34  brain]
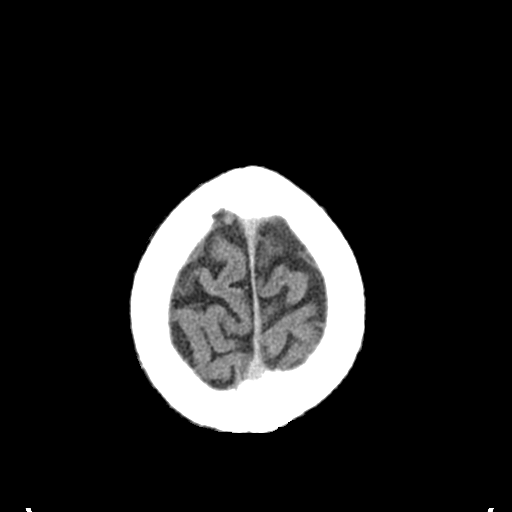
[im 31/34  brain]
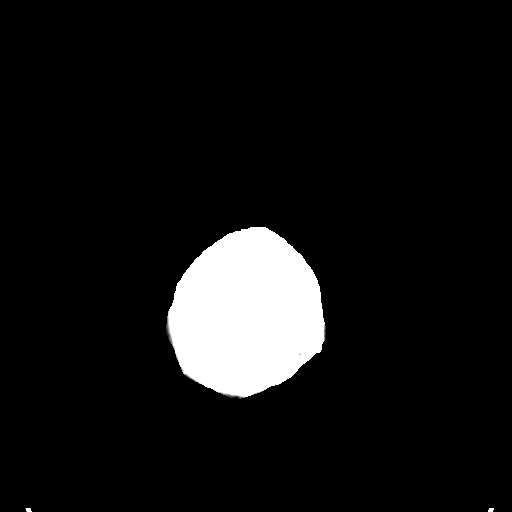
[im 31/34  bone]
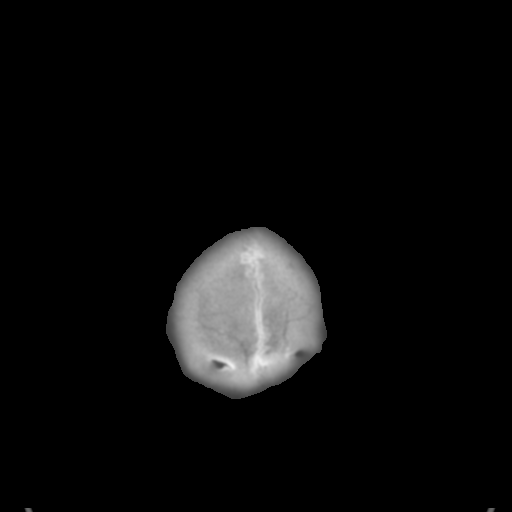

[Series 5: coronal soft tissue · coronal · 0.32mm/px · 3 of 69 slices shown]
[im 23/69  brain]
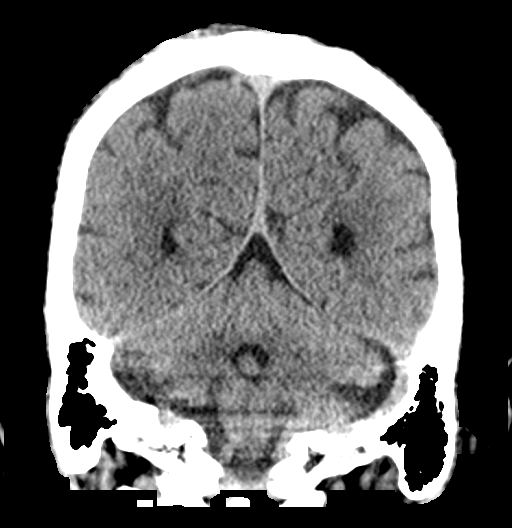
[im 31/69  brain]
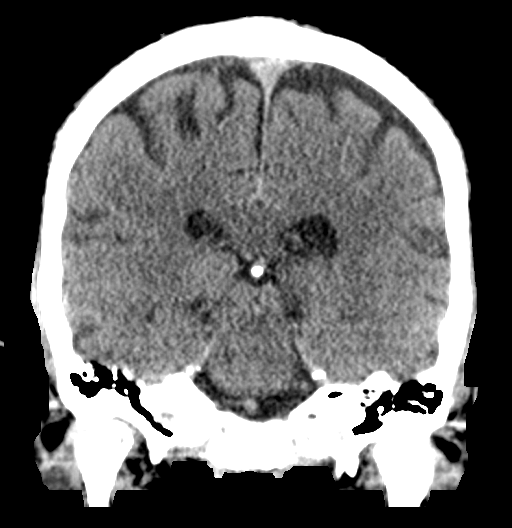
[im 38/69  brain]
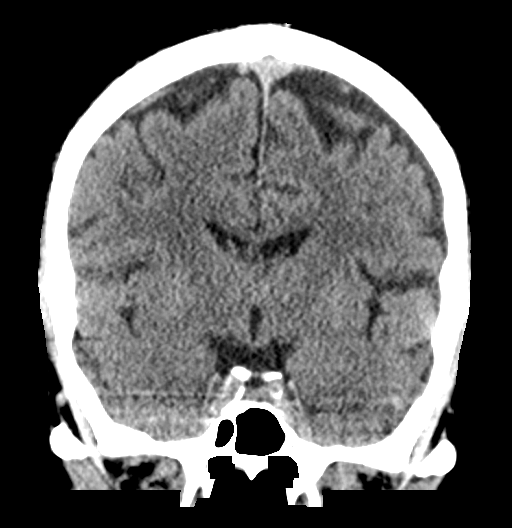

[Series 6: sagittal soft tissue · sagittal · 0.33mm/px · 3 of 53 slices shown]
[im 18/53  brain]
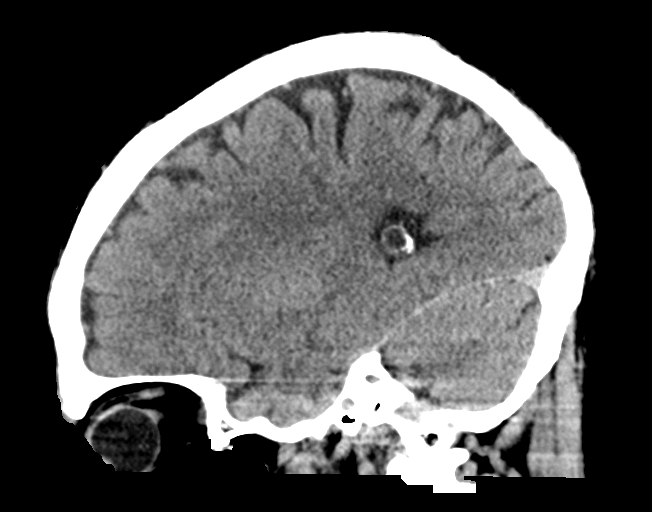
[im 27/53  brain]
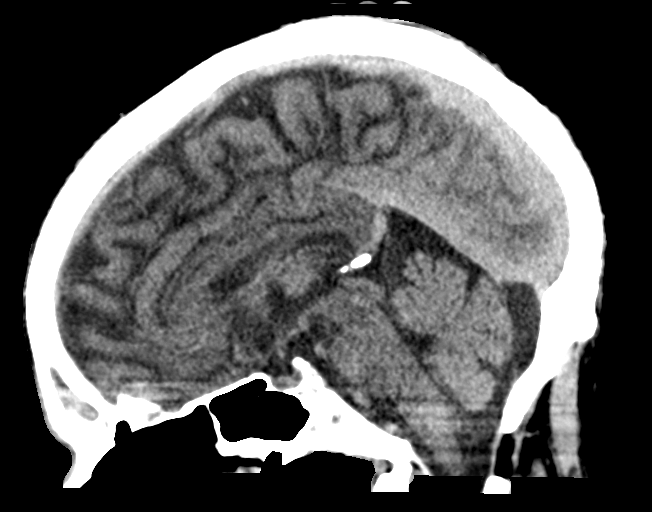
[im 35/53  brain]
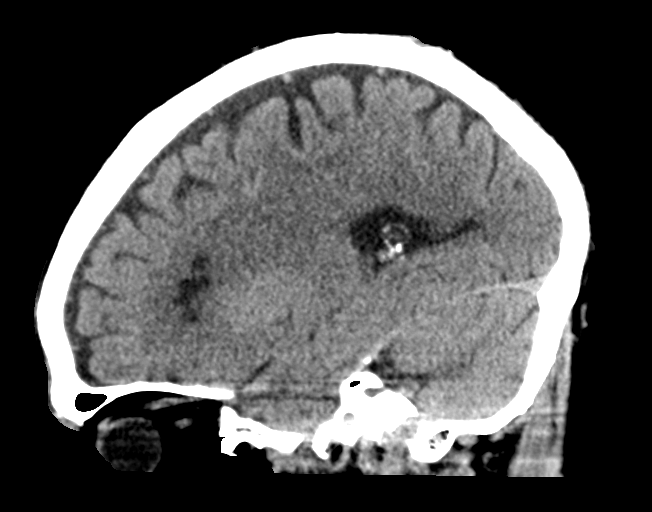

[15 of 47 positions shown; findings below may reference images not displayed]

FINDINGS: Brain:

There is no evidence of acute intracranial hemorrhage. No acute
demarcated cortical infarct. No evidence of intracranial mass. No
midline shift or extra-axial fluid collection. Redemonstrated, now
chronic, cortically based infarct centered within the left frontal
operculum and left insula. An additional tiny chronic cortical
infarct within the left parietal lobe was better appreciated on
prior MRI. Cerebral volume is normal for age.

Vascular: No hyperdense vessel.  Atherosclerotic calcifications.

Skull: Normal. Negative for fracture or focal lesion.

Sinuses/Orbits: Visualized orbits demonstrate no acute abnormality.
Redemonstrated chronic right frontoethmoidal sinus opacification. No
significant mastoid effusion.
IMPRESSION: No evidence of acute intracranial abnormality.

Chronic infarcts within left cerebral hemisphere as described.

Redemonstrated chronic right frontoethmoidal sinus opacification.

If clinical concern for acute maxillofacial fracture, consider
maxillofacial CT for further evaluation.

## 2020-08-11 ENCOUNTER — Telehealth: Payer: Self-pay

## 2020-08-11 NOTE — Telephone Encounter (Signed)
Called pt to obtain prescription drug plan information to submit prior authorization for Eliquis through Cover My Meds. Will submit prior auth with following information:  ID Number: [id number] Rx Bin: [id number] Rx PCN: [id number] Rx Group: [id number]  Sula Soda, PharmD Candidate

## 2020-08-20 ENCOUNTER — Telehealth: Payer: Self-pay | Admitting: Cardiology

## 2020-08-20 NOTE — Telephone Encounter (Signed)
Patient made aware that second level appeals has been faxed

## 2020-08-20 NOTE — Telephone Encounter (Signed)
   Pt c/o medication issue:  1. Name of Medication: ELIQUIS 5 MG TABS tablet  2. How are you currently taking this medication (dosage and times per day)? As directed   3. Are you having a reaction (difficulty breathing--STAT)? no  4. What is your medication issue? Insurance denied initial appeal for coverage. The patient needs to do a second level appeal. [employer] needs more documentation from the provider as to why this medication should be covered    Fax appeal to [phone]

## 2020-08-27 ENCOUNTER — Ambulatory Visit: Payer: BC Managed Care – PPO | Admitting: Cardiology

## 2020-09-06 ENCOUNTER — Telehealth: Payer: Self-pay | Admitting: Pharmacist

## 2020-09-06 ENCOUNTER — Other Ambulatory Visit: Payer: Self-pay | Admitting: Cardiology

## 2020-09-06 MED ORDER — APIXABAN 5 MG PO TABS: 5.0000 mg | ORAL_TABLET | Freq: Two times a day (BID) | ORAL | 1 refills | Status: DC

## 2020-09-06 NOTE — Telephone Encounter (Signed)
Called pt to let him know we're still waiting to hear back from his insurance on Eliquis approval. He still has 4 bottles of Eliquis at home. Pt was not using copay card and rx was being sent to mail order. I have activated a copay card and mailed it to pt. [facility] so that they can process the copay card even if we are still waiting to hear back from his insurance. He was appreciative for assistance.

## 2020-09-06 NOTE — Telephone Encounter (Signed)
Rx refill for Eliquis was sent in this am by Fuller Canada, PharmD, see telephone note from today 09/06/20.

## 2020-09-07 ENCOUNTER — Other Ambulatory Visit: Payer: Self-pay | Admitting: Cardiology

## 2020-09-07 NOTE — Telephone Encounter (Deleted)
Prescription refill request for Eliquis received. Indication: Last office visit: Scr: Age:  Weight:  

## 2020-09-07 NOTE — Telephone Encounter (Addendum)
See phone note from 09/06/2020.

## 2020-09-20 NOTE — Telephone Encounter (Signed)
Called pt's insurance to follow up with Eliquis coverage since appeals was submitted over a month ago. Was advised that Eliquis was approved 09/14/20 - 10/06/22. Called pt to let him know and left him a message.

## 2020-10-01 ENCOUNTER — Ambulatory Visit: Payer: BC Managed Care – PPO | Admitting: Cardiology

## 2020-10-01 ENCOUNTER — Encounter: Payer: Self-pay | Admitting: Cardiology

## 2020-10-01 ENCOUNTER — Other Ambulatory Visit: Payer: Self-pay

## 2020-10-01 VITALS — BP 142/80 | HR 78 | Ht 68.0 in | Wt 132.4 lb

## 2020-10-01 DIAGNOSIS — I2510 Atherosclerotic heart disease of native coronary artery without angina pectoris: Secondary | ICD-10-CM

## 2020-10-01 DIAGNOSIS — I428 Other cardiomyopathies: Secondary | ICD-10-CM

## 2020-10-01 DIAGNOSIS — Z79899 Other long term (current) drug therapy: Secondary | ICD-10-CM

## 2020-10-01 DIAGNOSIS — E785 Hyperlipidemia, unspecified: Secondary | ICD-10-CM | POA: Diagnosis not present

## 2020-10-01 DIAGNOSIS — I480 Paroxysmal atrial fibrillation: Secondary | ICD-10-CM

## 2020-10-01 NOTE — Progress Notes (Signed)
Cardiology Office Note:    Date:  10/01/2020   ID:  Adrian [name], DOB February 16, 1956, MRN [id number]  PCP:  [name], MD  Cardiologist:  Fransico Him, MD    Referring MD: [name], MD   Chief Complaint  Patient presents with  . Coronary Artery Disease  . Hyperlipidemia  . Hypertension    History of Present Illness:    Adrian [name] is a 65 y.o. male with a hx of atrial flutter in the setting of acute diverticulitis, nonischemic cardiomyopathy LVEF 30 to 35%, nonobstructive CAD on cath in 2017, PAF on Eliquis for CHA2DS2-VASc of 2.  Follow-up echo 02/2016 normal LVEF 55 to 60%.  Most recently he underwent robotic resection for diverticulitis 03/07/2019.  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  He had expressive aphasia but no focal weakness otherwise. LICA possible chronic occlusion not amenable to stent, no thrombectomy.  He failed swallowing evaluation.  He then developed pseudoaneurysm the right groin treated with thrombin injection.  Neurology felt it was safe to transition back to oral anticoagulation with aggressive outpatient rehab.  2D echo 03/09/2019 normal LVEF 60 to 65%.  She is here today for followup and is doing well.  She denies any chest pain or pressure, SOB, DOE, PND, orthopnea, LE edema, dizziness, palpitations or syncope. She is compliant with his meds and is tolerating meds with no SE.    Past Medical History:  Diagnosis Date  . AF (paroxysmal atrial fibrillation) ([place]) 11/29/2018  . Atrial flutter ([place]) 10/12/2015  . CAD (coronary artery disease), native coronary artery 12/06/2015   a. 10/2015 MV: EF  37%, reversible defect inferior apex, intermediate risk findings; b. 10/2015 Cath: 20% mid RCA.  . Colonic diverticular abscess   . Diverticulitis   . History of chemotherapy 2005   Cisplatin  . Hypothyroidism   . NICM (nonischemic cardiomyopathy) ([place]) 10/14/2015   a. Tachy mediated?;  b. Echo 3/17 - Mild concentric LVH, EF 30-35%,  anteroseptal, anterior, anterolateral, apical anterior, lateral hypokinesis, trivial MR, mild to moderately reduced RVSF; c. LHC 3/17 - mRCA 20%  . Paroxysmal atrial flutter ([place])    a. TEE 3/17 with ? LAA clot-->s/p TEE/DCCV 11/24/2015  . Radiation NOv.3,2005-Dec. 15, 2005   6810 cGy in 30 fractions  . 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  XRT    Past Surgical History:  Procedure Laterality Date  . APPENDECTOMY N/A 03/07/2019   Procedure: ROBOTIC ASSISTED APPENDECTOMY;  Surgeon: Michael Boston, MD;  Location: [facility];  Service: General;  Laterality: N/A;  . CARDIAC CATHETERIZATION N/A 10/18/2015   Procedure: Left Heart Cath and Coronary Angiography;  Surgeon: Burnell Blanks, MD;  Location[facility];  Service: Cardiovascular;  Laterality: N/A;  . CARDIOVERSION N/A 11/24/2015   Procedure: CARDIOVERSION;  Surgeon: Josue Hector, MD;  Location: [place];  Service: Cardiovascular;  Laterality: N/A;  . CYSTOSCOPY WITH STENT PLACEMENT Bilateral 03/07/2019   Procedure: CYSTOSCOPY WITH BILATERAL FIREFLY INJECTION;  Surgeon: Ardis Hughs, MD;  Location: [facility];  Service: Urology;  Laterality: Bilateral;  . GASTROSTOMY TUBE PLACEMENT  07/04/2004   IR - G tube for tonsilar cancer  . IR ANGIO INTRA EXTRACRAN SEL COM CAROTID INNOMINATE UNI R MOD SED  03/09/2019  . IR ANGIO VERTEBRAL SEL VERTEBRAL UNI R MOD SED  03/09/2019  . IR CT HEAD LTD  03/09/2019  . IR PERCUTANEOUS ART THROMBECTOMY/INFUSION INTRACRANIAL INC DIAG ANGIO  03/09/2019  .  IR RADIOLOGIST EVAL & MGMT  12/18/2018  . LAMINECTOMY     C5/placement of steel plate  . NECK SURGERY  2003   replaced disk  . RADIOLOGY WITH ANESTHESIA N/A 03/08/2019   Procedure: IR WITH ANESTHESIA;  Surgeon: Luanne Bras, MD;  Location: [place];  Service: Radiology;  Laterality: N/A;  . TEE WITHOUT CARDIOVERSION N/A 10/15/2015   Procedure: TRANSESOPHAGEAL ECHOCARDIOGRAM (TEE);  Surgeon: Larey Dresser, MD;  Location: [place];   Service: Cardiovascular;  Laterality: N/A;  . TEE WITHOUT CARDIOVERSION N/A 11/24/2015   Procedure: TRANSESOPHAGEAL ECHOCARDIOGRAM (TEE);  Surgeon: Josue Hector, MD;  Location: [place];  Service: Cardiovascular;  Laterality: N/A;  . XI ROBOTIC ASSISTED COLOSTOMY TAKEDOWN N/A 03/07/2019   Procedure: XI ROBOTIC ASSISTED LOW ANTERIOR RESECTION, RIGID PROCTOSCOPY;  Surgeon: Michael Boston, MD;  Location[facility];  Service: General;  Laterality: N/A;    Current Medications: Current Meds  Medication Sig  . atorvastatin (LIPITOR) 40 MG tablet TAKE 1 TABLET DAILY  . ELIQUIS 5 MG TABS tablet TAKE 1 TABLET BY MOUTH TWICE A DAY  . Ibuprofen 200 MG CAPS Take 400 mg by mouth daily as needed (Pain). PRN OTC  . SYNTHROID 112 MCG tablet TAKE 1 TABLET DAILY     Allergies:   Patient has no known allergies.   Social History   Socioeconomic History  . Marital status: Married    Spouse name: Not on file  . Number of children: Not on file  . Years of education: Not on file  . Highest education level: Not on file  Occupational History  . Not on file  Tobacco Use  . Smoking status: Current Every Day Smoker    Packs/day: 0.50    Years: 20.00    Pack years: 10.00    Types: Cigarettes  . Smokeless tobacco: Never Used  . Tobacco comment: 1/2 pack per day  Vaping Use  . Vaping Use: Never used  Substance and Sexual Activity  . Alcohol use: Yes    Alcohol/week: 12.0 standard drinks    Types: 12 Cans of beer per week    Comment: couple of beers each day  . Drug use: No  . Sexual activity: Not on file  Other Topics Concern  . Not on file  Social History Narrative  . Not on file   Social Determinants of Health   Financial Resource Strain: Not on file  Food Insecurity: Not on file  Transportation Needs: Not on file  Physical Activity: Not on file  Stress: Not on file  Social Connections: Not on file     Family History: The patient's family history includes Cancer in his brother; Colon  cancer in his father; Prostate cancer in his father; Skin cancer in his mother. There is no history of Esophageal cancer, Rectal cancer, or Stomach cancer.  ROS:   Please see the history of present illness.    Review of Systems  Musculoskeletal: Negative for gout.    All other systems reviewed and negative.   EKGs/Labs/Other Studies Reviewed:    The following studies were reviewed today: none  EKG:  EKG is ordered today and showed NSR with PACs with no ST changes  Recent Labs: 12/19/2019: ALT 13; BUN 11; Creatinine, Ser 0.76; Hemoglobin 14.1; Platelets 225; Potassium 4.1; Sodium 137; TSH 1.400   Recent Lipid Panel    Component Value Date/Time   CHOL 121 12/19/2019 1105   TRIG 90 12/19/2019 1105   HDL 54 12/19/2019 1105   CHOLHDL  2.2 12/19/2019 1105   CHOLHDL 2.4 03/09/2019 0656   VLDL 19 03/09/2019 0656   LDLCALC 50 12/19/2019 1105   LDLDIRECT 143.3 06/20/2012 1148    Physical Exam:    VS:  BP (!) 142/80   Pulse 78   Ht 5\' 8"  (1.727 m)   Wt 132 lb 6.4 oz (60.1 kg)   SpO2 95%   BMI 20.13 kg/m     Wt Readings from Last 3 Encounters:  10/01/20 132 lb 6.4 oz (60.1 kg)  01/14/20 128 lb (58.1 kg)  12/16/19 128 lb (58.1 kg)    GEN: Well nourished, well developed in no acute distress HEENT: Normal NECK: No JVD; No carotid bruits LYMPHATICS: No lymphadenopathy CARDIAC:RRR, no murmurs, rubs, gallops RESPIRATORY:  Clear to auscultation without rales, wheezing or rhonchi  ABDOMEN: Soft, non-tender, non-distended MUSCULOSKELETAL:  No edema; No deformity  SKIN: Warm and dry NEUROLOGIC:  Alert and oriented x 3 PSYCHIATRIC:  Normal affect    ASSESSMENT:    1. PAF (paroxysmal atrial fibrillation) ([place])   2. NICM (nonischemic cardiomyopathy) ([place])   3. Coronary artery disease involving native coronary artery of native heart without angina pectoris   4. Hyperlipidemia, unspecified hyperlipidemia type    PLAN:    In order of problems listed above:  1.  PAF -s/p TEE  3/17 with LAA thrombus -s/p DCCV 11/2015 -he is maintaining NSR on exam today with no palpitations -he denies any bleeding problems.   -continue Apixaban 5mg  BID -Check BMET and CBC  2.  NIDCM -felt tachy related -no significant CAD on cath 2017 -echo with EF 60-65% 03/2019  3.  ASCAD -mild nonobstructive CAD by cath 2017 -he denies any anginal symptoms -no ASA due to DOAC -continue statin  4.  HLD -LDL goal < 70 -LDL 50 in May 2021 -repeat FLP and ALT -continue atorvastatin 40mg  daily   Medication Adjustments/Labs and Tests Ordered: Current medicines are reviewed at length with the patient today.  Concerns regarding medicines are outlined above.  No orders of the defined types were placed in this encounter.  No orders of the defined types were placed in this encounter.   Signed, Fransico Him, MD  10/01/2020 9:56 AM    [place]

## 2020-10-01 NOTE — Patient Instructions (Signed)
Medication Instructions:  Your physician recommends that you continue on your current medications as directed. Please refer to the Current Medication list given to you today.  *If you need a refill on your cardiac medications before your next appointment, please call your pharmacy*   Lab Work: CBC, CMET, [place] Your physician recommends that you return for lab work on 10/08/20.  You will need to FAST for this appointment - nothing to eat or drink after midnight the night before except water.   Testing/Procedures: none   Follow-Up: At [facility], you and your health needs are our priority.  As part of our continuing mission to provide you with exceptional heart care, we have created designated Provider Care Teams.  These Care Teams include your primary Cardiologist (physician) and Advanced Practice Providers (APPs -  Physician Assistants and Nurse Practitioners) who all work together to provide you with the care you need, when you need it.  We recommend signing up for the patient portal called "MyChart".  Sign up information is provided on this After Visit Summary.  MyChart is used to connect with patients for Virtual Visits (Telemedicine).  Patients are able to view lab/test results, encounter notes, upcoming appointments, etc.  Non-urgent messages can be sent to your provider as well.   To learn more about what you can do with MyChart, go to [email or web address].    Your next appointment:   1 year(s)  The format for your next appointment:   In Person  Provider:   Fransico Him, MD

## 2020-10-08 ENCOUNTER — Other Ambulatory Visit: Payer: Self-pay

## 2020-10-08 ENCOUNTER — Other Ambulatory Visit: Payer: BC Managed Care – PPO | Admitting: *Deleted

## 2020-10-08 DIAGNOSIS — E785 Hyperlipidemia, unspecified: Secondary | ICD-10-CM

## 2020-10-08 DIAGNOSIS — Z79899 Other long term (current) drug therapy: Secondary | ICD-10-CM

## 2020-10-08 LAB — LIPID PANEL
Chol/HDL Ratio: 2.2 ratio (ref 0.0–5.0)
Cholesterol, Total: 151 mg/dL (ref 100–199)
HDL: 68 mg/dL (ref >39)
LDL Chol Calc (NIH): 72 mg/dL (ref 0–99)
Triglycerides: 53 mg/dL (ref 0–149)
VLDL Cholesterol Cal: 11 mg/dL (ref 5–40)

## 2020-10-08 LAB — COMPREHENSIVE METABOLIC PANEL
ALT: 21 IU/L (ref 0–44)
AST: 24 IU/L (ref 0–40)
Albumin/Globulin Ratio: 1.2 (ref 1.2–2.2)
Albumin: 4.2 g/dL (ref 3.8–4.8)
Alkaline Phosphatase: 97 IU/L (ref 44–121)
BUN/Creatinine Ratio: 15 (ref 10–24)
BUN: 12 mg/dL (ref 8–27)
Bilirubin Total: 0.5 mg/dL (ref 0.0–1.2)
CO2: 25 mmol/L (ref 20–29)
Calcium: 9.7 mg/dL (ref 8.6–10.2)
Chloride: 100 mmol/L (ref 96–106)
Creatinine, Ser: 0.81 mg/dL (ref 0.76–1.27)
Globulin, Total: 3.4 g/dL (ref 1.5–4.5)
Glucose: 113 mg/dL — ABNORMAL HIGH (ref 65–99)
Potassium: 4.8 mmol/L (ref 3.5–5.2)
Sodium: 139 mmol/L (ref 134–144)
Total Protein: 7.6 g/dL (ref 6.0–8.5)
eGFR: 98 mL/min/{1.73_m2} (ref >59)

## 2020-10-08 LAB — CBC
Hematocrit: 44.7 % (ref 37.5–51.0)
Hemoglobin: 15.3 g/dL (ref 13.0–17.7)
MCH: 32.2 pg (ref 26.6–33.0)
MCHC: 34.2 g/dL (ref 31.5–35.7)
MCV: 94 fL (ref 79–97)
Platelets: 149 10*3/uL — ABNORMAL LOW (ref 150–450)
RBC: 4.75 x10E6/uL (ref 4.14–5.80)
RDW: 13.6 % (ref 11.6–15.4)
WBC: 2.4 10*3/uL — CL (ref 3.4–10.8)

## 2020-10-14 ENCOUNTER — Telehealth: Payer: Self-pay | Admitting: Cardiology

## 2020-10-14 NOTE — Telephone Encounter (Signed)
Left message for patient with results. Advised to call back with any questions.  

## 2020-10-14 NOTE — Telephone Encounter (Signed)
Patient returning a call from [place] to discuss lab results. Patient states he is not allowed to have his phone on at work, so if Adrian [name] could leave a detailed message with his results they wont have to keep playing phone tag

## 2020-10-22 NOTE — Telephone Encounter (Signed)
Per after hours patient call, patient returned the call to go over his results.  "he could not understand the message left for him and he needs a call back from the office to clarify."

## 2020-10-22 NOTE — Telephone Encounter (Signed)
Left message for patient with results

## 2021-01-18 ENCOUNTER — Other Ambulatory Visit: Payer: Self-pay | Admitting: Cardiology

## 2021-01-18 NOTE — Telephone Encounter (Signed)
Pt last saw Dr [name] 10/01/20, last labs 10/08/20 Creat 0.81, age 65, weight 60.1kg, based on specified criteria pt is on appropriate dosage of Eliquis 5mg  BID.  Will refill rx.

## 2021-04-19 ENCOUNTER — Telehealth: Payer: Self-pay | Admitting: Cardiology

## 2021-04-19 ENCOUNTER — Telehealth: Payer: Self-pay

## 2021-04-19 MED ORDER — ATORVASTATIN CALCIUM 40 MG PO TABS: 40.0000 mg | ORAL_TABLET | Freq: Every day | ORAL | 2 refills | Status: DC

## 2021-04-19 NOTE — Telephone Encounter (Signed)
Patient's wife would like to know if Dr. [name] can manage the patient's synthroid. She states it is hard for the patient to see multiple physicians and he would prefer having the majority of his care under Dr. [name] if possible. Please advise.

## 2021-04-19 NOTE — Telephone Encounter (Signed)
*  STAT* If patient is at the pharmacy, call can be transferred to refill team.   1. Which medications need to be refilled? (please list name of each medication and dose if known)  atorvastatin (LIPITOR) 40 MG tablet ELIQUIS 5 MG TABS tablet   2. Which pharmacy/location (including street and city if local pharmacy) is medication to be sent to? [facility] [place] [place] [facility] Portal [facility]   3. Do they need a 30 day or 90 day supply?  90 day supply   Patient's wife states the patient has a 1 week supply of Atorvastatin and requested that it be expedited if possible.

## 2021-04-19 NOTE — Telephone Encounter (Signed)
Pt's medication was sent to [facility]'s pharmacy [facility] requested. Confirmation received.

## 2021-04-19 NOTE — Telephone Encounter (Signed)
Patient called stating he only has a 5 day supply left of the SYNTHROID 112 MCG tablet pt usually gets mail pharmacy but would like to know if partial Rx could be sent to [place]

## 2021-04-19 NOTE — Telephone Encounter (Signed)
Will forward to Dr. [name] and her nurse.

## 2021-04-19 NOTE — Telephone Encounter (Signed)
Patient has not been seen in our office in 2 years and will need an appointment. Spoke with the patient. He is aware that an appointment is needed and has scheduled for Friday.

## 2021-04-20 NOTE — Telephone Encounter (Signed)
Advised patient's wife that his PCP needs to follow his thyroid. She verbalized understanding.

## 2021-04-21 ENCOUNTER — Other Ambulatory Visit: Payer: Self-pay

## 2021-04-22 ENCOUNTER — Ambulatory Visit (INDEPENDENT_AMBULATORY_CARE_PROVIDER_SITE_OTHER): Payer: BC Managed Care – PPO | Admitting: Family Medicine

## 2021-04-22 VITALS — BP 150/90 | HR 72 | Temp 97.5°F | Ht 68.0 in | Wt 134.7 lb

## 2021-04-22 DIAGNOSIS — Z125 Encounter for screening for malignant neoplasm of prostate: Secondary | ICD-10-CM | POA: Diagnosis not present

## 2021-04-22 DIAGNOSIS — I10 Essential (primary) hypertension: Secondary | ICD-10-CM

## 2021-04-22 DIAGNOSIS — E039 Hypothyroidism, unspecified: Secondary | ICD-10-CM

## 2021-04-22 DIAGNOSIS — Z0000 Encounter for general adult medical examination without abnormal findings: Secondary | ICD-10-CM | POA: Diagnosis not present

## 2021-04-22 LAB — BASIC METABOLIC PANEL
BUN: 15 mg/dL (ref 6–23)
CO2: 29 mEq/L (ref 19–32)
Calcium: 9.2 mg/dL (ref 8.4–10.5)
Chloride: 102 mEq/L (ref 96–112)
Creatinine, Ser: 0.93 mg/dL (ref 0.40–1.50)
GFR: 86.23 mL/min (ref >60.00)
Glucose, Bld: 98 mg/dL (ref 70–99)
Potassium: 4 mEq/L (ref 3.5–5.1)
Sodium: 139 mEq/L (ref 135–145)

## 2021-04-22 LAB — CBC WITH DIFFERENTIAL/PLATELET
Basophils Absolute: 0 10*3/uL (ref 0.0–0.1)
Basophils Relative: 1.3 % (ref 0.0–3.0)
Eosinophils Absolute: 0 10*3/uL (ref 0.0–0.7)
Eosinophils Relative: 1.4 % (ref 0.0–5.0)
HCT: 43.8 % (ref 39.0–52.0)
Hemoglobin: 15.1 g/dL (ref 13.0–17.0)
Lymphocytes Relative: 65.6 % — ABNORMAL HIGH (ref 12.0–46.0)
Lymphs Abs: 1.2 10*3/uL (ref 0.7–4.0)
MCHC: 34.6 g/dL (ref 30.0–36.0)
MCV: 93.9 fl (ref 78.0–100.0)
Monocytes Absolute: 0.5 10*3/uL (ref 0.1–1.0)
Monocytes Relative: 27.2 % — ABNORMAL HIGH (ref 3.0–12.0)
Neutro Abs: 0.1 10*3/uL — ABNORMAL LOW (ref 1.4–7.7)
Neutrophils Relative %: 4.5 % — ABNORMAL LOW (ref 43.0–77.0)
Platelets: 138 10*3/uL — ABNORMAL LOW (ref 150.0–400.0)
RBC: 4.66 Mil/uL (ref 4.22–5.81)
RDW: 13.3 % (ref 11.5–15.5)
WBC: 1.8 10*3/uL — CL (ref 4.0–10.5)

## 2021-04-22 LAB — HEPATIC FUNCTION PANEL
ALT: 22 U/L (ref 0–53)
AST: 28 U/L (ref 0–37)
Albumin: 3.9 g/dL (ref 3.5–5.2)
Alkaline Phosphatase: 95 U/L (ref 39–117)
Bilirubin, Direct: 0.1 mg/dL (ref 0.0–0.3)
Total Bilirubin: 0.7 mg/dL (ref 0.2–1.2)
Total Protein: 7.6 g/dL (ref 6.0–8.3)

## 2021-04-22 LAB — LIPID PANEL
Cholesterol: 137 mg/dL (ref 0–200)
HDL: 53.8 mg/dL (ref >39.00)
LDL Cholesterol: 43 mg/dL (ref 0–99)
NonHDL: 82.79
Total CHOL/HDL Ratio: 3
Triglycerides: 197 mg/dL — ABNORMAL HIGH (ref 0.0–149.0)
VLDL: 39.4 mg/dL (ref 0.0–40.0)

## 2021-04-22 LAB — PSA: PSA: 1.63 ng/mL (ref 0.10–4.00)

## 2021-04-22 LAB — TSH: TSH: 8.26 u[IU]/mL — ABNORMAL HIGH (ref 0.35–5.50)

## 2021-04-22 MED ORDER — ATORVASTATIN CALCIUM 40 MG PO TABS: 40.0000 mg | ORAL_TABLET | Freq: Every day | ORAL | 3 refills | Status: DC

## 2021-04-22 MED ORDER — LOSARTAN POTASSIUM 50 MG PO TABS: 50.0000 mg | ORAL_TABLET | Freq: Every day | ORAL | 3 refills | Status: DC

## 2021-04-22 MED ORDER — APIXABAN 5 MG PO TABS: 5.0000 mg | ORAL_TABLET | Freq: Two times a day (BID) | ORAL | 1 refills | Status: DC

## 2021-04-22 MED ORDER — LEVOTHYROXINE SODIUM 112 MCG PO TABS: 112.0000 ug | ORAL_TABLET | Freq: Every day | ORAL | 3 refills | Status: DC

## 2021-04-22 NOTE — Patient Instructions (Signed)
I have placed order for repeat colonoscopy  Let me know if you change your mind regarding flu vaccine, pneumonia vaccine, or Shingles vaccine.

## 2021-04-22 NOTE — Addendum Note (Signed)
Addended by: Amanda Cockayne on: 04/22/2021 09:14 AM   Modules accepted: Orders

## 2021-04-22 NOTE — Progress Notes (Signed)
Established Patient Office Visit  Subjective:  Patient ID: Adrian [name], male    DOB: 1955/12/06  Age: 65 y.o. MRN: [id number]  CC:  Chief Complaint  Patient presents with   Annual Exam    HPI [name] presents for physical exam.  He has history of CAD, atrial fibrillation, remote history of tonsil cancer, history of CVA, hypothyroidism, dyslipidemia, ongoing nicotine use.  Unfortunately still smoking.  He still has difficulty finding words at times.  We have noted that with several recent visits he has had mildly elevated blood pressure and currently not taking any antihypertensive medications.  His current medications include atorvastatin, Eliquis, Synthroid.  Needing refills of all those medications.  Health maintenance reviewed  -He has not had flu vaccine, pneumonia vaccine, or Shingrix and declines all those.  No history of hepatitis C screening.  Attempted colonoscopy 2020 patient had flexion related to complicated diverticular disease which prevented colonoscopy.  Recommended 1 year follow-up at that time.  He had surgery for his diverticular disease.  Denies any recent change in bowel habits.  Social history-he is separated and has been for few years.  Still smokes 1/2 pack cigarettes per day.  He has a son who just got out of the [employer] and started his own business.  Daughter who is getting PhD in [employer] [place].  He is working with [employer] 25 hours/week  Family history reviewed with no changes  Past Medical History:  Diagnosis Date   AF (paroxysmal atrial fibrillation) ([place]) 11/29/2018   Atrial flutter ([place]) 10/12/2015   CAD (coronary artery disease), native coronary artery 12/06/2015   a. 10/2015 MV: EF  37%, reversible defect inferior apex, intermediate risk findings; b. 10/2015 Cath: 20% mid RCA.   Colonic diverticular abscess    Diverticulitis    History of chemotherapy 2005   Cisplatin   Hypothyroidism    NICM (nonischemic cardiomyopathy)  ([place]) 10/14/2015   a. Tachy mediated?;  b. Echo 3/17 - Mild concentric LVH, EF 30-35%, anteroseptal, anterior, anterolateral, apical anterior, lateral hypokinesis, trivial MR, mild to moderately reduced RVSF; c. LHC 3/17 - mRCA 20%   Paroxysmal atrial flutter ([place])    a. TEE 3/17 with ? LAA clot-->s/p TEE/DCCV 11/24/2015   Radiation NOv.3,2005-Dec. 15, 2005   6810 cGy in 30 fractions   Tonsil cancer [facility]) 2005   Dr Romeo Rabon.  XRT    Past Surgical History:  Procedure Laterality Date   APPENDECTOMY N/A 03/07/2019   Procedure: ROBOTIC ASSISTED APPENDECTOMY;  Surgeon: Michael Boston, MD;  [facility];  Service: General;  Laterality: N/A;   CARDIAC CATHETERIZATION N/A 10/18/2015   Procedure: Left Heart Cath and Coronary Angiography;  Surgeon: Burnell Blanks, MD;  Location: [place] [facility];  Service: Cardiovascular;  Laterality: N/A;   CARDIOVERSION N/A 11/24/2015   Procedure: CARDIOVERSION;  Surgeon: Josue Hector, MD;  Location: [facility];  Service: Cardiovascular;  Laterality: N/A;   CYSTOSCOPY WITH STENT PLACEMENT Bilateral 03/07/2019   Procedure: CYSTOSCOPY WITH BILATERAL FIREFLY INJECTION;  Surgeon: Ardis Hughs, MD;  [facility];  Service: Urology;  Laterality: Bilateral;   GASTROSTOMY TUBE PLACEMENT  07/04/2004   IR - G tube for tonsilar cancer   IR ANGIO INTRA EXTRACRAN SEL COM CAROTID INNOMINATE UNI R MOD SED  03/09/2019   IR ANGIO VERTEBRAL SEL VERTEBRAL UNI R MOD SED  03/09/2019   IR CT HEAD LTD  03/09/2019   IR PERCUTANEOUS ART THROMBECTOMY/INFUSION INTRACRANIAL INC DIAG  ANGIO  03/09/2019   IR RADIOLOGIST EVAL & MGMT  12/18/2018   LAMINECTOMY     C5/placement of steel plate   NECK SURGERY  2003   replaced disk   RADIOLOGY WITH ANESTHESIA N/A 03/08/2019   Procedure: IR WITH ANESTHESIA;  Surgeon: Luanne Bras, MD;  Location: [place];  Service: Radiology;  Laterality: N/A;   TEE WITHOUT CARDIOVERSION N/A 10/15/2015   Procedure: TRANSESOPHAGEAL ECHOCARDIOGRAM  (TEE);  Surgeon: Larey Dresser, MD;  Location: [place];  Service: Cardiovascular;  Laterality: N/A;   TEE WITHOUT CARDIOVERSION N/A 11/24/2015   Procedure: TRANSESOPHAGEAL ECHOCARDIOGRAM (TEE);  Surgeon: Josue Hector, MD;  Location: [facility] END[facility]COPY;  Service: Cardiovascular;  Laterality: N/A;   XI ROBOTIC ASSISTED COLOSTOMY TAKEDOWN N/A 03/07/2019   Procedure: XI ROBOTIC ASSISTED LOW ANTERIOR RESECTION, RIGID PROCTOSCOPY;  Surgeon: Michael Boston, MD;  [facility];  Service: General;  Laterality: N/A;    Family History  Problem Relation Age of Onset   Prostate cancer Father    Colon cancer Father    Skin cancer Mother    Cancer Brother    Esophageal cancer Neg Hx    Rectal cancer Neg Hx    Stomach cancer Neg Hx     Social History   Socioeconomic History   Marital status: Married    Spouse name: Not on file   Number of children: Not on file   Years of education: Not on file   Highest education level: Not on file  Occupational History   Not on file  Tobacco Use   Smoking status: Every Day    Packs/day: 0.50    Years: 20.00    Pack years: 10.00    Types: Cigarettes   Smokeless tobacco: Never   Tobacco comments:    1/2 pack per day  Vaping Use   Vaping Use: Never used  Substance and Sexual Activity   Alcohol use: Yes    Alcohol/week: 12.0 standard drinks    Types: 12 Cans of beer per week    Comment: couple of beers each day   Drug use: No   Sexual activity: Not on file  Other Topics Concern   Not on file  Social History Narrative   Not on file   Social Determinants of Health   Financial Resource Strain: Not on file  Food Insecurity: Not on file  Transportation Needs: Not on file  Physical Activity: Not on file  Stress: Not on file  Social Connections: Not on file  Intimate Partner Violence: Not on file    Outpatient Medications Prior to Visit  Medication Sig Dispense Refill   Ibuprofen 200 MG CAPS Take 400 mg by mouth daily as needed (Pain). PRN  OTC     nicotine (NICODERM CQ) 14 mg/24hr patch After 8m patches are complete, apply 158mpatch one daily for 2 weeks. Remove old patch before applying new one. 14 patch 0   atorvastatin (LIPITOR) 40 MG tablet Take 1 tablet (40 mg total) by mouth daily. 90 tablet 2   ELIQUIS 5 MG TABS tablet TAKE 1 TABLET TWICE A DAY 180 tablet 1   SYNTHROID 112 MCG tablet TAKE 1 TABLET DAILY 90 tablet 3   No facility-administered medications prior to visit.    No Known Allergies  ROS Review of Systems  Constitutional:  Negative for activity change, appetite change, fatigue and fever.  HENT:  Negative for congestion, ear pain and trouble swallowing.   Eyes:  Negative for pain and visual disturbance.  Respiratory:  Negative for cough and wheezing.   Cardiovascular:  Negative for chest pain and palpitations.  Gastrointestinal:  Negative for abdominal distention, abdominal pain, blood in stool, constipation, diarrhea, nausea, rectal pain and vomiting.  Endocrine: Negative for polydipsia and polyuria.  Genitourinary:  Negative for dysuria, hematuria and testicular pain.  Musculoskeletal:  Negative for arthralgias and joint swelling.  Skin:  Negative for rash.  Neurological:  Negative for dizziness, syncope and headaches.  Hematological:  Negative for adenopathy.  Psychiatric/Behavioral:  Negative for confusion and dysphoric mood.      Objective:    Physical Exam Constitutional:      General: He is not in acute distress.    Appearance: He is well-developed.  HENT:     Head: Normocephalic and atraumatic.     Right Ear: External ear normal.     Left Ear: External ear normal.  Eyes:     Conjunctiva/sclera: Conjunctivae normal.     Pupils: Pupils are equal, round, and reactive to light.  Neck:     Thyroid: No thyromegaly.  Cardiovascular:     Rate and Rhythm: Normal rate and regular rhythm.     Heart sounds: Normal heart sounds. No murmur heard. Pulmonary:     Effort: No respiratory distress.      Breath sounds: No wheezing.  Abdominal:     General: Bowel sounds are normal. There is no distension.     Palpations: Abdomen is soft. There is no mass.     Tenderness: There is no abdominal tenderness. There is no guarding or rebound.  Musculoskeletal:     Cervical back: Normal range of motion and neck supple.     Right lower leg: No edema.     Left lower leg: No edema.  Lymphadenopathy:     Cervical: No cervical adenopathy.  Skin:    Findings: No rash.  Neurological:     Mental Status: He is alert and oriented to person, place, and time.     Cranial Nerves: No cranial nerve deficit.     Deep Tendon Reflexes: Reflexes normal.    BP (!) 150/90 (BP Location: Left Arm, Patient Position: Sitting, Cuff Size: Normal)   Pulse 72   Temp (!) 97.5 F (36.4 C) (Oral)   Ht 5' 8"  (1.727 m)   Wt 134 lb 11.2 oz (61.1 kg)   SpO2 99%   BMI 20.48 kg/m  Wt Readings from Last 3 Encounters:  04/22/21 134 lb 11.2 oz (61.1 kg)  10/01/20 132 lb 6.4 oz (60.1 kg)  01/14/20 128 lb (58.1 kg)     Health Maintenance Due  Topic Date Due   COVID-19 Vaccine (1) Never done   Hepatitis C Screening  Never done   COLONOSCOPY (Pts 45-59yr Insurance coverage will need to be confirmed)  03/05/2020    There are no preventive care reminders to display for this patient.  Lab Results  Component Value Date   TSH 1.400 12/19/2019   Lab Results  Component Value Date   WBC 2.4 (LL) 10/08/2020   HGB 15.3 10/08/2020   HCT 44.7 10/08/2020   MCV 94 10/08/2020   PLT 149 (L) 10/08/2020   Lab Results  Component Value Date   NA 139 10/08/2020   K 4.8 10/08/2020   CO2 25 10/08/2020   GLUCOSE 113 (H) 10/08/2020   BUN 12 10/08/2020   CREATININE 0.81 10/08/2020   BILITOT 0.5 10/08/2020   ALKPHOS 97 10/08/2020   AST 24 10/08/2020   ALT 21 10/08/2020  PROT 7.6 10/08/2020   ALBUMIN 4.2 10/08/2020   CALCIUM 9.7 10/08/2020   ANIONGAP 10 03/15/2019   EGFR 98 10/08/2020   GFR 101.17 12/05/2017   Lab  Results  Component Value Date   CHOL 151 10/08/2020   Lab Results  Component Value Date   HDL 68 10/08/2020   Lab Results  Component Value Date   LDLCALC 72 10/08/2020   Lab Results  Component Value Date   TRIG 53 10/08/2020   Lab Results  Component Value Date   CHOLHDL 2.2 10/08/2020   Lab Results  Component Value Date   HGBA1C 5.5 03/09/2019      Assessment & Plan:   #1 physical exam.  Multiple chronic problems as above.  We discussed several health maintenance issues as follows  -Strongly advocate that he quit smoking.  Current motivation is relatively low. -Set up colonoscopy.  He had attempted colonoscopy 2020 but had flexion contracture related to complicated diverticular disease and subsequent had surgery for that. -Check hepatitis C antibody -Strongly recommend flu vaccine, Prevnar 20, and Shingrix vaccine and he declines all of these -Obtain screening labs  #2 elevated blood pressure.  Multiple elevations recently.  Today repeat left arm seated after 20 minutes rest 158/90 -Start losartan 50 mg daily -Bring back in 1 month to reassess   Meds ordered this encounter  Medications   atorvastatin (LIPITOR) 40 MG tablet    Sig: Take 1 tablet (40 mg total) by mouth daily.    Dispense:  90 tablet    Refill:  3   levothyroxine (SYNTHROID) 112 MCG tablet    Sig: Take 1 tablet (112 mcg total) by mouth daily.    Dispense:  90 tablet    Refill:  3   apixaban (ELIQUIS) 5 MG TABS tablet    Sig: Take 1 tablet (5 mg total) by mouth 2 (two) times daily.    Dispense:  180 tablet    Refill:  1   losartan (COZAAR) 50 MG tablet    Sig: Take 1 tablet (50 mg total) by mouth daily.    Dispense:  90 tablet    Refill:  3    Follow-up: Return in about 1 month (around 05/22/2021).    Carolann Littler, MD

## 2021-04-25 LAB — HEPATITIS C ANTIBODY
Hepatitis C Ab: NONREACTIVE
SIGNAL TO CUT-OFF: 0.07 (ref <1.00)

## 2021-05-24 ENCOUNTER — Ambulatory Visit (INDEPENDENT_AMBULATORY_CARE_PROVIDER_SITE_OTHER): Payer: BC Managed Care – PPO | Admitting: Family Medicine

## 2021-05-24 ENCOUNTER — Other Ambulatory Visit: Payer: Self-pay

## 2021-05-24 VITALS — BP 158/82 | HR 72 | Temp 97.6°F | Wt 137.5 lb

## 2021-05-24 DIAGNOSIS — I10 Essential (primary) hypertension: Secondary | ICD-10-CM | POA: Diagnosis not present

## 2021-05-24 MED ORDER — LOSARTAN POTASSIUM-HCTZ 100-12.5 MG PO TABS: 1.0000 | ORAL_TABLET | Freq: Every day | ORAL | 5 refills | Status: DC

## 2021-05-24 NOTE — Progress Notes (Signed)
Established Patient Office Visit  Subjective:  Patient ID: Adrian [name], male    DOB: 1955/11/14  Age: 65 y.o. MRN: [id number]  CC:  Chief Complaint  Patient presents with   Follow-up    HPI Adrian [name] presents for follow-up regarding hypertension.  We recently started losartan 50 mg daily.  He was here for physical and had elevated blood pressure then.  He is tolerating well with no side effects.  No headaches or dizziness.  Initial blood pressure was better here today but repeat both left and right arm seated uncontrolled.  Past Medical History:  Diagnosis Date   AF (paroxysmal atrial fibrillation) ([place]) 11/29/2018   Atrial flutter ([place]) 10/12/2015   CAD (coronary artery disease), native coronary artery 12/06/2015   a. 10/2015 MV: EF  37%, reversible defect inferior apex, intermediate risk findings; b. 10/2015 Cath: 20% mid RCA.   Colonic diverticular abscess    Diverticulitis    History of chemotherapy 2005   Cisplatin   Hypothyroidism    NICM (nonischemic cardiomyopathy) ([place]) 10/14/2015   a. Tachy mediated?;  b. Echo 3/17 - Mild concentric LVH, EF 30-35%, anteroseptal, anterior, anterolateral, apical anterior, lateral hypokinesis, trivial MR, mild to moderately reduced RVSF; c. LHC 3/17 - mRCA 20%   Paroxysmal atrial flutter ([place])    a. TEE 3/17 with ? LAA clot-->s/p TEE/DCCV 11/24/2015   Radiation NOv.3,2005-Dec. 15, 2005   6810 cGy in 30 fractions   Tonsil cancer [facility]) 2005   Dr Romeo Rabon.  XRT    Past Surgical History:  Procedure Laterality Date   APPENDECTOMY N/A 03/07/2019   Procedure: ROBOTIC ASSISTED APPENDECTOMY;  Surgeon: Michael Boston, MD;  [facility];  Service: General;  Laterality: N/A;   CARDIAC CATHETERIZATION N/A 10/18/2015   Procedure: Left Heart Cath and Coronary Angiography;  Surgeon: Burnell Blanks, MD;  Location: [place] [facility];  Service: Cardiovascular;  Laterality: N/A;   CARDIOVERSION N/A 11/24/2015   Procedure: CARDIOVERSION;  Surgeon:  Josue Hector, MD;  Location: [place];  Service: Cardiovascular;  Laterality: N/A;   CYSTOSCOPY WITH STENT PLACEMENT Bilateral 03/07/2019   Procedure: CYSTOSCOPY WITH BILATERAL FIREFLY INJECTION;  Surgeon: Ardis Hughs, MD;  [facility];  Service: Urology;  Laterality: Bilateral;   GASTROSTOMY TUBE PLACEMENT  07/04/2004   IR - G tube for tonsilar cancer   IR ANGIO INTRA EXTRACRAN SEL COM CAROTID INNOMINATE UNI R MOD SED  03/09/2019   IR ANGIO VERTEBRAL SEL VERTEBRAL UNI R MOD SED  03/09/2019   IR CT HEAD LTD  03/09/2019   IR PERCUTANEOUS ART THROMBECTOMY/INFUSION INTRACRANIAL INC DIAG ANGIO  03/09/2019   IR RADIOLOGIST EVAL & MGMT  12/18/2018   LAMINECTOMY     C5/placement of steel plate   NECK SURGERY  2003   replaced disk   RADIOLOGY WITH ANESTHESIA N/A 03/08/2019   Procedure: IR WITH ANESTHESIA;  Surgeon: Luanne Bras, MD;  Location: [place];  Service: Radiology;  Laterality: N/A;   TEE WITHOUT CARDIOVERSION N/A 10/15/2015   Procedure: TRANSESOPHAGEAL ECHOCARDIOGRAM (TEE);  Surgeon: Larey Dresser, MD;  Location: [place];  Service: Cardiovascular;  Laterality: N/A;   TEE WITHOUT CARDIOVERSION N/A 11/24/2015   Procedure: TRANSESOPHAGEAL ECHOCARDIOGRAM (TEE);  Surgeon: Josue Hector, MD;  Location: [place];  Service: Cardiovascular;  Laterality: N/A;   XI ROBOTIC ASSISTED COLOSTOMY TAKEDOWN N/A 03/07/2019   Procedure: XI ROBOTIC ASSISTED LOW ANTERIOR RESECTION, RIGID PROCTOSCOPY;  Surgeon: Michael Boston, MD;  [facility];  Service: General;  Laterality: N/A;    Family History  Problem Relation Age of Onset   Prostate cancer Father    Colon cancer Father    Skin cancer Mother    Cancer Brother    Esophageal cancer Neg Hx    Rectal cancer Neg Hx    Stomach cancer Neg Hx     Social History   Socioeconomic History   Marital status: Married    Spouse name: Not on file   Number of children: Not on file   Years of education: Not on file   Highest education  level: Not on file  Occupational History   Not on file  Tobacco Use   Smoking status: Every Day    Packs/day: 0.50    Years: 20.00    Pack years: 10.00    Types: Cigarettes   Smokeless tobacco: Never   Tobacco comments:    1/2 pack per day  Vaping Use   Vaping Use: Never used  Substance and Sexual Activity   Alcohol use: Yes    Alcohol/week: 12.0 standard drinks    Types: 12 Cans of beer per week    Comment: couple of beers each day   Drug use: No   Sexual activity: Not on file  Other Topics Concern   Not on file  Social History Narrative   Not on file   Social Determinants of Health   Financial Resource Strain: Not on file  Food Insecurity: Not on file  Transportation Needs: Not on file  Physical Activity: Not on file  Stress: Not on file  Social Connections: Not on file  Intimate Partner Violence: Not on file    Outpatient Medications Prior to Visit  Medication Sig Dispense Refill   apixaban (ELIQUIS) 5 MG TABS tablet Take 1 tablet (5 mg total) by mouth 2 (two) times daily. 180 tablet 1   atorvastatin (LIPITOR) 40 MG tablet Take 1 tablet (40 mg total) by mouth daily. 90 tablet 3   Ibuprofen 200 MG CAPS Take 400 mg by mouth daily as needed (Pain). PRN OTC     levothyroxine (SYNTHROID) 112 MCG tablet Take 1 tablet (112 mcg total) by mouth daily. 90 tablet 3   nicotine (NICODERM CQ) 14 mg/24hr patch After 67m patches are complete, apply 122mpatch one daily for 2 weeks. Remove old patch before applying new one. 14 patch 0   losartan (COZAAR) 50 MG tablet Take 1 tablet (50 mg total) by mouth daily. 90 tablet 3   No facility-administered medications prior to visit.    No Known Allergies  ROS Review of Systems  Constitutional:  Negative for fatigue.  Eyes:  Negative for visual disturbance.  Respiratory:  Negative for cough, chest tightness and shortness of breath.   Cardiovascular:  Negative for chest pain, palpitations and leg swelling.  Neurological:  Negative  for dizziness, syncope, weakness, light-headedness and headaches.     Objective:    Physical Exam Constitutional:      Appearance: He is well-developed.  HENT:     Right Ear: External ear normal.     Left Ear: External ear normal.  Eyes:     Pupils: Pupils are equal, round, and reactive to light.  Neck:     Thyroid: No thyromegaly.  Cardiovascular:     Rate and Rhythm: Normal rate and regular rhythm.  Pulmonary:     Effort: Pulmonary effort is normal. No respiratory distress.     Breath sounds: Normal breath sounds. No wheezing or rales.  Musculoskeletal:     Cervical back: Neck supple.     Right lower leg: No edema.     Left lower leg: No edema.  Neurological:     Mental Status: He is alert and oriented to person, place, and time.    BP (!) 158/82 (BP Location: Left Arm, Cuff Size: Normal)   Pulse 72   Temp 97.6 F (36.4 C) (Oral)   Wt 137 lb 8 oz (62.4 kg)   SpO2 98%   BMI 20.91 kg/m  Wt Readings from Last 3 Encounters:  05/24/21 137 lb 8 oz (62.4 kg)  04/22/21 134 lb 11.2 oz (61.1 kg)  10/01/20 132 lb 6.4 oz (60.1 kg)     Health Maintenance Due  Topic Date Due   COVID-19 Vaccine (1) Never done   COLONOSCOPY (Pts 45-47yr Insurance coverage will need to be confirmed)  03/05/2020    There are no preventive care reminders to display for this patient.  Lab Results  Component Value Date   TSH 8.26 (H) 04/22/2021   Lab Results  Component Value Date   WBC 1.8 cL (LL) 04/22/2021   HGB 15.1 04/22/2021   HCT 43.8 04/22/2021   MCV 93.9 04/22/2021   PLT 138.0 (L) 04/22/2021   Lab Results  Component Value Date   NA 139 04/22/2021   K 4.0 04/22/2021   CO2 29 04/22/2021   GLUCOSE 98 04/22/2021   BUN 15 04/22/2021   CREATININE 0.93 04/22/2021   BILITOT 0.7 04/22/2021   ALKPHOS 95 04/22/2021   AST 28 04/22/2021   ALT 22 04/22/2021   PROT 7.6 04/22/2021   ALBUMIN 3.9 04/22/2021   CALCIUM 9.2 04/22/2021   ANIONGAP 10 03/15/2019   EGFR 98 10/08/2020    GFR 86.23 04/22/2021   Lab Results  Component Value Date   CHOL 137 04/22/2021   Lab Results  Component Value Date   HDL 53.80 04/22/2021   Lab Results  Component Value Date   LDLCALC 43 04/22/2021   Lab Results  Component Value Date   TRIG 197.0 (H) 04/22/2021   Lab Results  Component Value Date   CHOLHDL 3 04/22/2021   Lab Results  Component Value Date   HGBA1C 5.5 03/09/2019      Assessment & Plan:   Hypertension.  Suboptimally controlled.  Recent addition of losartan 50 mg daily. -Increase to losartan HCTZ 100/12.5 mg. -Set up follow-up in about 4 weeks to reassess and plan basic metabolic panel then. -Try to keep sodium intake less than 2500 mg daily  Meds ordered this encounter  Medications   losartan-hydrochlorothiazide (HYZAAR) 100-12.5 MG tablet    Sig: Take 1 tablet by mouth daily.    Dispense:  30 tablet    Refill:  5    Follow-up: Return in about 4 weeks (around 06/21/2021).    BCarolann Littler MD

## 2021-06-24 ENCOUNTER — Encounter: Payer: Self-pay | Admitting: Internal Medicine

## 2021-08-23 ENCOUNTER — Ambulatory Visit: Payer: BC Managed Care – PPO | Admitting: Internal Medicine

## 2021-08-23 ENCOUNTER — Encounter: Payer: Self-pay | Admitting: Internal Medicine

## 2021-08-23 VITALS — BP 138/82 | HR 72 | Ht 68.0 in | Wt 138.0 lb

## 2021-08-23 DIAGNOSIS — K5732 Diverticulitis of large intestine without perforation or abscess without bleeding: Secondary | ICD-10-CM

## 2021-08-23 DIAGNOSIS — Z7901 Long term (current) use of anticoagulants: Secondary | ICD-10-CM | POA: Diagnosis not present

## 2021-08-23 DIAGNOSIS — Z8673 Personal history of transient ischemic attack (TIA), and cerebral infarction without residual deficits: Secondary | ICD-10-CM | POA: Diagnosis not present

## 2021-08-23 DIAGNOSIS — Z1211 Encounter for screening for malignant neoplasm of colon: Secondary | ICD-10-CM

## 2021-08-23 MED ORDER — SUTAB 1479-225-188 MG PO TABS: 1.0000 | ORAL_TABLET | Freq: Once | ORAL | 0 refills | Status: AC

## 2021-08-23 NOTE — Patient Instructions (Signed)
If you are age 66 or older, your body mass index should be between 23-30. Your Body mass index is 20.98 kg/m. If this is out of the aforementioned range listed, please consider follow up with your Primary Care Provider.  If you are age 8 or younger, your body mass index should be between 19-25. Your Body mass index is 20.98 kg/m. If this is out of the aformentioned range listed, please consider follow up with your Primary Care Provider.   ________________________________________________________  The [place] [facility] providers would like to encourage you to use [facility] to communicate with providers for non-urgent requests or questions.  Due to long hold times on the telephone, sending your provider a message by [facility] may be a faster and more efficient way to get a response.  Please allow 48 business hours for a response.  Please remember that this is for non-urgent requests.  _______________________________________________________  If you are age 63 or older, your body mass index should be between 23-30. Your Body mass index is 20.98 kg/m. If this is out of the aforementioned range listed, please consider follow up with your Primary Care Provider.  If you are age 46 or younger, your body mass index should be between 19-25. Your Body mass index is 20.98 kg/m. If this is out of the aformentioned range listed, please consider follow up with your Primary Care Provider.   ________________________________________________________  The [place] [facility] providers would like to encourage you to use [facility] to communicate with providers for non-urgent requests or questions.  Due to long hold times on the telephone, sending your provider a message by [facility] may be a faster and more efficient way to get a response.  Please allow 48 business hours for a response.  Please remember that this is for non-urgent requests.  _______________________________________________________

## 2021-08-23 NOTE — Progress Notes (Signed)
HISTORY OF PRESENT ILLNESS:  Adrian [name] is a 66 y.o. male who presents today regarding surveillance colonoscopy.  He has multiple medical problems including paroxysmal atrial fibrillation for which she is on chronic anticoagulation therapy in the form of Eliquis, coronary artery disease, and complicated diverticulitis for which she underwent segmental colonic resection in 2020.  Just prior to this he had incomplete colonoscopy due to diverticular disease.  His last colonoscopy in 2014 revealed a tubular adenoma in addition to diverticulosis.  Prior to patient discharge from his segmental colectomy he suffered a stroke.  He was off Eliquis at the time.  He has had some residual defects with regards to verbal processing.  He continues to work at [employer].  His GI review of systems is negative.  He is interested in pursuing his surveillance colonoscopy.  REVIEW OF SYSTEMS:  All non-GI ROS negative as otherwise stated in the HPI. Past Medical History:  Diagnosis Date   AF (paroxysmal atrial fibrillation) ([place]) 11/29/2018   Atrial flutter ([place]) 10/12/2015   CAD (coronary artery disease), native coronary artery 12/06/2015   a. 10/2015 MV: EF  37%, reversible defect inferior apex, intermediate risk findings; b. 10/2015 Cath: 20% mid RCA.   Colonic diverticular abscess    Diverticulitis    History of chemotherapy 2005   Cisplatin   Hypothyroidism    NICM (nonischemic cardiomyopathy) ([place]) 10/14/2015   a. Tachy mediated?;  b. Echo 3/17 - Mild concentric LVH, EF 30-35%, anteroseptal, anterior, anterolateral, apical anterior, lateral hypokinesis, trivial MR, mild to moderately reduced RVSF; c. LHC 3/17 - mRCA 20%   Paroxysmal atrial flutter ([place])    a. TEE 3/17 with ? LAA clot-->s/p TEE/DCCV 11/24/2015   Radiation NOv.3,2005-Dec. 15, 2005   6810 cGy in 30 fractions   Tonsil cancer [facility]) 2005   Dr Romeo Rabon.  XRT    Past Surgical History:  Procedure Laterality Date   APPENDECTOMY N/A 03/07/2019    Procedure: ROBOTIC ASSISTED APPENDECTOMY;  Surgeon: Michael Boston, MD;  [facility];  Service: General;  Laterality: N/A;   CARDIAC CATHETERIZATION N/A 10/18/2015   Procedure: Left Heart Cath and Coronary Angiography;  Surgeon: Burnell Blanks, MD;  Location: [place] [facility];  Service: Cardiovascular;  Laterality: N/A;   CARDIOVERSION N/A 11/24/2015   Procedure: CARDIOVERSION;  Surgeon: Josue Hector, MD;  Location: [place];  Service: Cardiovascular;  Laterality: N/A;   CYSTOSCOPY WITH STENT PLACEMENT Bilateral 03/07/2019   Procedure: CYSTOSCOPY WITH BILATERAL FIREFLY INJECTION;  Surgeon: Ardis Hughs, MD;  [facility];  Service: Urology;  Laterality: Bilateral;   GASTROSTOMY TUBE PLACEMENT  07/04/2004   IR - G tube for tonsilar cancer   IR ANGIO INTRA EXTRACRAN SEL COM CAROTID INNOMINATE UNI R MOD SED  03/09/2019   IR ANGIO VERTEBRAL SEL VERTEBRAL UNI R MOD SED  03/09/2019   IR CT HEAD LTD  03/09/2019   IR PERCUTANEOUS ART THROMBECTOMY/INFUSION INTRACRANIAL INC DIAG ANGIO  03/09/2019   IR RADIOLOGIST EVAL & MGMT  12/18/2018   LAMINECTOMY     C5/placement of steel plate   NECK SURGERY  2003   replaced disk   RADIOLOGY WITH ANESTHESIA N/A 03/08/2019   Procedure: IR WITH ANESTHESIA;  Surgeon: Luanne Bras, MD;  Location: [place];  Service: Radiology;  Laterality: N/A;   TEE WITHOUT CARDIOVERSION N/A 10/15/2015   Procedure: TRANSESOPHAGEAL ECHOCARDIOGRAM (TEE);  Surgeon: Larey Dresser, MD;  Location: [place];  Service: Cardiovascular;  Laterality: N/A;   TEE WITHOUT CARDIOVERSION  N/A 11/24/2015   Procedure: TRANSESOPHAGEAL ECHOCARDIOGRAM (TEE);  Surgeon: Josue Hector, MD;  Location: [place];  Service: Cardiovascular;  Laterality: N/A;   XI ROBOTIC ASSISTED COLOSTOMY TAKEDOWN N/A 03/07/2019   Procedure: XI ROBOTIC ASSISTED LOW ANTERIOR RESECTION, RIGID PROCTOSCOPY;  Surgeon: Michael Boston, MD;  [facility];  Service: General;  Laterality: N/A;    Social  History Adrian [name]  reports that he has been smoking cigarettes. He has a 10.00 pack-year smoking history. He has never used smokeless tobacco. He reports current alcohol use of about 12.0 standard drinks per week. He reports that he does not use drugs.  family history includes Cancer in his brother; Colon cancer in his father; Prostate cancer in his father; Skin cancer in his mother.  No Known Allergies     PHYSICAL EXAMINATION: Vital signs: BP 138/82    Pulse 72    Ht 5\' 8"  (1.727 m)    Wt 138 lb (62.6 kg)    BMI 20.98 kg/m   Constitutional: generally well-appearing, no acute distress Psychiatric: alert and oriented x3, cooperative Eyes: extraocular movements intact, anicteric, conjunctiva pink Mouth: Mask Neck: supple no lymphadenopathy Cardiovascular: heart regular rate and rhythm, no murmur Lungs: clear to auscultation bilaterally Abdomen: soft, nontender, nondistended, no obvious ascites, no peritoneal signs, normal bowel sounds, no organomegaly Rectal: Deferred until colonoscopy Extremities: no lower extremity edema bilaterally Skin: no lesions on visible extremities Neuro: No focal deficits.  Cranial nerves intact  ASSESSMENT:  1.  History of adenomatous colon polyps in 2014.  Due for surveillance colonoscopy 2.  History of complicated diverticular disease status post resection 2020.  Incomplete colonoscopy prior to that time due to diverticular disease 3.  History of atrial fibrillation on chronic anticoagulation 4.  History of stroke off anticoagulation 5.  Multiple medical problems   PLAN:  1.  Colonoscopy.  The patient is HIGH RISK given his comorbidities and the need to address his chronic anticoagulation.The nature of the procedure, as well as the risks, benefits, and alternatives were carefully and thoroughly reviewed with the patient. Ample time for discussion and questions allowed. The patient understood, was satisfied, and agreed to proceed.  2.  We discussed  the pros and cons of performing his procedure on anticoagulation versus off anticoagulation.  Given his history, we will perform his examination on anticoagulation with associated limitations reviewed.  He understands.  He agrees.

## 2021-10-07 ENCOUNTER — Encounter: Payer: BC Managed Care – PPO | Admitting: Internal Medicine

## 2021-10-20 ENCOUNTER — Other Ambulatory Visit: Payer: Self-pay | Admitting: Family Medicine

## 2021-10-21 ENCOUNTER — Ambulatory Visit: Payer: BC Managed Care – PPO | Admitting: Cardiology

## 2022-02-09 NOTE — Progress Notes (Signed)
Office Visit    Patient Name: Adrian [name] Date of Encounter: 02/10/2022  PCP:  [name], MD   [facility]  Cardiologist:  Fransico Him, MD  Advanced Practice Provider:  No care team member to display Electrophysiologist:  None   Chief Complaint    Adrian [name] is a 66 y.o. male with a hx of atrial flutter in the setting of acute diverticulitis, nonischemic cardiomyopathy LVEF 30 to 35%, nonobstructive LAD on cardiac cath in 2017, PAF on Eliquis for CHA2DS2-VASc of 2 presents today for overdue 1 year follow-up.  He underwent a robotic resection for diverticulitis 03/07/2019.  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  He had expressive aphasiaconfident but no focal weaknesses otherwise.  He failed swallow eval.  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  Neurology felt it was safe to transition him back to oral anticoagulation with aggressive outpatient rehab.  2D echocardiogram 03/09/2019 with normal LVEF 60 to 65%.  He was seen 10/01/2020 and was doing well at that time.  He denies any chest pain or pressure, SOB, DOE, PND, orthopnea.  He also denied lower extremity edema, dizziness, palpitations, or syncope.  He is compliant with his medications.  Today, he overall is doing well since his last visit.  He states that sometimes he gets short of breath when lifting heavy boxes.  He works for [employer].  However, he has not had any significant shortness of breath with activity or chest pain.  He denies palpitations, fluttering in his chest, or feeling like his heart is racing.  He has not had any lower extremity edema.  He overall feels well and is able to do what he wants to do.  Blood pressure is well controlled today.  EKG reviewed with the patient.  We will get an updated lipid panel today.  Reports no shortness of breath nor dyspnea on exertion. Reports no chest pain, pressure, or tightness. No edema,  orthopnea, PND. Reports no palpitations.    Past Medical History    Past Medical History:  Diagnosis Date   AF (paroxysmal atrial fibrillation) ([place]) 11/29/2018   Atrial flutter ([place]) 10/12/2015   CAD (coronary artery disease), native coronary artery 12/06/2015   a. 10/2015 MV: EF  37%, reversible defect inferior apex, intermediate risk findings; b. 10/2015 Cath: 20% mid RCA.   Colonic diverticular abscess    Diverticulitis    History of chemotherapy 2005   Cisplatin   Hypothyroidism    NICM (nonischemic cardiomyopathy) ([place]) 10/14/2015   a. Tachy mediated?;  b. Echo 3/17 - Mild concentric LVH, EF 30-35%, anteroseptal, anterior, anterolateral, apical anterior, lateral hypokinesis, trivial MR, mild to moderately reduced RVSF; c. LHC 3/17 - mRCA 20%   Paroxysmal atrial flutter ([place])    a. TEE 3/17 with ? LAA clot-->s/p TEE/DCCV 11/24/2015   Radiation NOv.3,2005-Dec. 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  XRT   Past Surgical History:  Procedure Laterality Date   APPENDECTOMY N/A 03/07/2019   Procedure: ROBOTIC ASSISTED APPENDECTOMY;  Surgeon: Michael Boston, MD;  [facility];  Service: General;  Laterality: N/A;   CARDIAC CATHETERIZATION N/A 10/18/2015   Procedure: Left Heart Cath and Coronary Angiography;  Surgeon: Burnell Blanks, MD;  Location: [place] [facility];  Service: Cardiovascular;  Laterality: N/A;   CARDIOVERSION N/A 11/24/2015   Procedure: CARDIOVERSION;  Surgeon:  Josue Hector, MD;  Location: [place];  Service: Cardiovascular;  Laterality: N/A;   CYSTOSCOPY WITH STENT PLACEMENT Bilateral 03/07/2019   Procedure: CYSTOSCOPY WITH BILATERAL FIREFLY INJECTION;  Surgeon: Ardis Hughs, MD;  [facility];  Service: Urology;  Laterality: Bilateral;   GASTROSTOMY TUBE PLACEMENT  07/04/2004   IR - G tube for tonsilar cancer   IR ANGIO INTRA EXTRACRAN SEL COM CAROTID INNOMINATE UNI R MOD SED  03/09/2019   IR ANGIO VERTEBRAL  SEL VERTEBRAL UNI R MOD SED  03/09/2019   IR CT HEAD LTD  03/09/2019   IR PERCUTANEOUS ART THROMBECTOMY/INFUSION INTRACRANIAL INC DIAG ANGIO  03/09/2019   IR RADIOLOGIST EVAL & MGMT  12/18/2018   LAMINECTOMY     C5/placement of steel plate   NECK SURGERY  2003   replaced disk   RADIOLOGY WITH ANESTHESIA N/A 03/08/2019   Procedure: IR WITH ANESTHESIA;  Surgeon: Luanne Bras, MD;  Location: [place];  Service: Radiology;  Laterality: N/A;   TEE WITHOUT CARDIOVERSION N/A 10/15/2015   Procedure: TRANSESOPHAGEAL ECHOCARDIOGRAM (TEE);  Surgeon: Larey Dresser, MD;  Location: [place];  Service: Cardiovascular;  Laterality: N/A;   TEE WITHOUT CARDIOVERSION N/A 11/24/2015   Procedure: TRANSESOPHAGEAL ECHOCARDIOGRAM (TEE);  Surgeon: Josue Hector, MD;  Location: [place];  Service: Cardiovascular;  Laterality: N/A;   XI ROBOTIC ASSISTED COLOSTOMY TAKEDOWN N/A 03/07/2019   Procedure: XI ROBOTIC ASSISTED LOW ANTERIOR RESECTION, RIGID PROCTOSCOPY;  Surgeon: Michael Boston, MD;  [facility];  Service: General;  Laterality: N/A;    Allergies  No Known Allergies  EKGs/Labs/Other Studies Reviewed:   The following studies were reviewed today:  Echocardiogram 03/09/2019  [name]     1. The left ventricle has normal systolic function with an ejection  fraction of 60-65%. The cavity size was normal. Left ventricular diastolic  parameters were normal.   2. The right ventricle has normal systolic function. The cavity was  normal. There is no increase in right ventricular wall thickness.   3. No evidence of mitral valve stenosis.   4. The tricuspid valve is grossly normal.   5. No stenosis of the aortic valve.   6. The aorta is normal in size and structure.   7. The aortic root and ascending aorta are normal in size and structure.   8. The interatrial septum was not assessed.    EKG:  EKG is  ordered today.  The ekg ordered today demonstrates normal sinus rhythm, rate 74 bpm  Recent  Labs: 04/22/2021: ALT 22; BUN 15; Creatinine, Ser 0.93; Hemoglobin 15.1; Platelets 138.0; Potassium 4.0; Sodium 139; TSH 8.26  Recent Lipid Panel    Component Value Date/Time   CHOL 137 04/22/2021 0914   CHOL 151 10/08/2020 1058   TRIG 197.0 (H) 04/22/2021 0914   HDL 53.80 04/22/2021 0914   HDL 68 10/08/2020 1058   CHOLHDL 3 04/22/2021 0914   VLDL 39.4 04/22/2021 0914   LDLCALC 43 04/22/2021 0914   LDLCALC 72 10/08/2020 1058   LDLDIRECT 143.3 06/20/2012 1148    Risk Assessment/Calculations:   CHA2DS2-VASc Score = 6   This indicates a 9.7% annual risk of stroke. The patient's score is based upon: CHF History: 1 HTN History: 1 Diabetes History: 0 Stroke History: 2 Vascular Disease History: 1 Age Score: 1 Gender Score: 0     Home Medications   Current Meds  Medication Sig   apixaban (ELIQUIS) 5 MG TABS tablet Take 1 tablet (5 mg total) by mouth 2 (two)  times daily.   atorvastatin (LIPITOR) 40 MG tablet Take 1 tablet (40 mg total) by mouth daily.   Ibuprofen 200 MG CAPS Take 400 mg by mouth daily as needed (Pain). PRN OTC   levothyroxine (SYNTHROID) 112 MCG tablet Take 1 tablet (112 mcg total) by mouth daily.   losartan-hydrochlorothiazide (HYZAAR) 100-12.5 MG tablet TAKE 1 TABLET BY MOUTH EVERY DAY     Review of Systems      All other systems reviewed and are otherwise negative except as noted above.  Physical Exam    VS:  BP 130/72   Pulse 74   Ht '5\' 8"'$  (1.727 m)   Wt 135 lb (61.2 kg)   SpO2 95%   BMI 20.53 kg/m  , BMI Body mass index is 20.53 kg/m.  Wt Readings from Last 3 Encounters:  02/10/22 135 lb (61.2 kg)  08/23/21 138 lb (62.6 kg)  05/24/21 137 lb 8 oz (62.4 kg)     GEN: Well nourished, well developed, in no acute distress. HEENT: normal. Neck: Supple, no JVD, carotid bruits, or masses. Cardiac: RRR, no murmurs, rubs, or gallops. No clubbing, cyanosis, edema.  Radials/PT 2+ and equal bilaterally.  Respiratory:  Respirations regular and  unlabored, Rhonchi in bilateral lower lobes. GI: Soft, nontender, nondistended. MS: No deformity or atrophy. Skin: Warm and dry, no rash. Neuro:  Strength and sensation are intact. Psych: Normal affect.  Assessment & Plan    PAF -Status post DCCV 11/2015 -Anticoagulated with Eliquis 5 mg twice daily -No issues with palpitation, racing heartbeat, or fluttering in his chest  Nonischemic cardiomyopathy -No new shortness of breath or lower extremity edema -Appears euvolemic on exam  CAD -No chest pain -Continue GDMT: Lipitor 40 mg daily, Hyzaar 100-12-1/2 mg daily, Eliquis 5 mg twice daily -EKG normal sinus rhythm, rate 74 bpm  HLD -LDL goal less than 70 -We will order updated lipid panel and LFTs today -Continue Lipitor 40 mg daily  History of embolic stroke -He does have some residual effects with spelling and speech.  He does have trouble with math. -He did participate in speech therapy  6. Nicotine Use -He smokes about a pack a day -Cessation advised.  Patient states that he will try to cut back. -Education provided    Disposition: Follow up 1 year with Fransico Him, MD or APP.  Signed, Elgie Collard, PA-C 02/10/2022, 9:13 AM [place]

## 2022-02-10 ENCOUNTER — Encounter: Payer: Self-pay | Admitting: Physician Assistant

## 2022-02-10 ENCOUNTER — Ambulatory Visit: Payer: BC Managed Care – PPO | Admitting: Cardiology

## 2022-02-10 ENCOUNTER — Ambulatory Visit (INDEPENDENT_AMBULATORY_CARE_PROVIDER_SITE_OTHER): Payer: BC Managed Care – PPO | Admitting: Physician Assistant

## 2022-02-10 VITALS — BP 130/72 | HR 74 | Ht 68.0 in | Wt 135.0 lb

## 2022-02-10 DIAGNOSIS — I2510 Atherosclerotic heart disease of native coronary artery without angina pectoris: Secondary | ICD-10-CM

## 2022-02-10 DIAGNOSIS — I428 Other cardiomyopathies: Secondary | ICD-10-CM

## 2022-02-10 DIAGNOSIS — I480 Paroxysmal atrial fibrillation: Secondary | ICD-10-CM | POA: Diagnosis not present

## 2022-02-10 DIAGNOSIS — Z79899 Other long term (current) drug therapy: Secondary | ICD-10-CM

## 2022-02-10 DIAGNOSIS — I63419 Cerebral infarction due to embolism of unspecified middle cerebral artery: Secondary | ICD-10-CM

## 2022-02-10 DIAGNOSIS — E785 Hyperlipidemia, unspecified: Secondary | ICD-10-CM

## 2022-02-10 LAB — LIPID PANEL
Chol/HDL Ratio: 2.4 ratio (ref 0.0–5.0)
Cholesterol, Total: 142 mg/dL (ref 100–199)
HDL: 59 mg/dL (ref >39)
LDL Chol Calc (NIH): 60 mg/dL (ref 0–99)
Triglycerides: 131 mg/dL (ref 0–149)
VLDL Cholesterol Cal: 23 mg/dL (ref 5–40)

## 2022-02-10 LAB — HEPATIC FUNCTION PANEL
ALT: 17 IU/L (ref 0–44)
AST: 27 IU/L (ref 0–40)
Albumin: 4.3 g/dL (ref 3.8–4.8)
Alkaline Phosphatase: 93 IU/L (ref 44–121)
Bilirubin Total: 0.3 mg/dL (ref 0.0–1.2)
Bilirubin, Direct: 0.12 mg/dL (ref 0.00–0.40)
Total Protein: 7.8 g/dL (ref 6.0–8.5)

## 2022-02-10 NOTE — Patient Instructions (Signed)
Medication Instructions:  Your physician recommends that you continue on your current medications as directed. Please refer to the Current Medication list given to you today.  *If you need a refill on your cardiac medications before your next appointment, please call your pharmacy*   Lab Work: Lipids and lft's today If you have labs (blood work) drawn today and your tests are completely normal, you will receive your results only by: [place] (if you have MyChart) OR A paper copy in the mail If you have any lab test that is abnormal or we need to change your treatment, we will call you to review the results.  Follow-Up: At [facility], you and your health needs are our priority.  As part of our continuing mission to provide you with exceptional heart care, we have created designated Provider Care Teams.  These Care Teams include your primary Cardiologist (physician) and Advanced Practice Providers (APPs -  Physician Assistants and Nurse Practitioners) who all work together to provide you with the care you need, when you need it.  We recommend signing up for the patient portal called "MyChart".  Sign up information is provided on this After Visit Summary.  MyChart is used to connect with patients for Virtual Visits (Telemedicine).  Patients are able to view lab/test results, encounter notes, upcoming appointments, etc.  Non-urgent messages can be sent to your provider as well.   To learn more about what you can do with MyChart, go to [email or web address].    Your next appointment:   1 year(s)  The format for your next appointment:   In Person  Provider:   Fransico Him, MD  or Nicholes Rough, PA-C      Other Instructions Steps to Quit Smoking Smoking tobacco is the leading cause of preventable death. It can affect almost every organ in the body. Smoking puts you and those around you at risk for developing many serious chronic diseases. Quitting smoking can be very challenging. Do  not get discouraged if you are not successful the first time. Some people need to make many attempts to quit before they achieve long-term success. Do your best to stick to your quit plan, and talk with your health care provider if you have any questions or concerns. How do I get ready to quit? When you decide to quit smoking, create a plan to help you succeed. Before you quit: Pick a date to quit. Set a date within the next 2 weeks to give you time to prepare. Write down the reasons why you are quitting. Keep this list in places where you will see it often. Tell your family, friends, and co-workers that you are quitting. Support from people you are close to can make quitting easier. Talk with your health care provider about your options for quitting smoking. Find out what treatment options are covered by your health insurance. Identify people, places, things, and activities that make you want to smoke (triggers). Avoid them. What first steps can I take to quit smoking? Throw away all cigarettes at home, at work, and in your car. Throw away smoking accessories, such as Scientist, research (medical). Clean your car. Make sure to empty the ashtray. Clean your home, including curtains and carpets. What strategies can I use to quit smoking? Talk with your health care provider about combining strategies, such as taking medicines while you are also receiving in-person counseling. Using these two strategies together makes you more likely to succeed in quitting than if you used either strategy  on its own. If you are pregnant or breastfeeding, talk with your health care provider about finding counseling or other support strategies to quit smoking. Do not take medicine to help you quit smoking unless your health care provider tells you to. Quit right away Quit smoking completely, instead of gradually reducing how much you smoke over a period of time. Stopping smoking right away may be more successful than gradually  quitting. Attend in-person counseling to help you build problem-solving skills. You are more likely to succeed in quitting if you attend counseling sessions regularly. Even short sessions of 10 minutes can be effective. Take medicine You may take medicines to help you quit smoking. Some medicines require a prescription. You can also purchase over-the-counter medicines. Medicines may have nicotine in them to replace the nicotine in cigarettes. Medicines may: Help to stop cravings. Help to relieve withdrawal symptoms. Your health care provider may recommend: Nicotine patches, gum, or lozenges. Nicotine inhalers or sprays. Non-nicotine medicine that you take by mouth. Find resources Find resources and support systems that can help you quit smoking and remain smoke-free after you quit. These resources are most helpful when you use them often. They include: Online chats with a Social worker. Telephone quitlines. Printed Furniture conservator/restorer. Support groups or group counseling. Text messaging programs. Mobile phone apps or applications. Use apps that can help you stick to your quit plan by providing reminders, tips, and encouragement. Examples of free services include Quit Guide from the CDC and smokefree.gov  What can I do to make it easier to quit?  Reach out to your family and friends for support and encouragement. Call telephone quitlines, such as 1-800-QUIT-NOW, reach out to support groups, or work with a counselor for support. Ask people who smoke to avoid smoking around you. Avoid places that trigger you to smoke, such as bars, parties, or smoke-break areas at work. Spend time with people who do not smoke. Lessen the stress in your life. Stress can be a smoking trigger for some people. To lessen stress, try: Exercising regularly. Doing deep-breathing exercises. Doing yoga. Meditating. What benefits will I see if I quit smoking? Over time, you should start to see positive results, such  as: Improved sense of smell and taste. Decreased coughing and sore throat. Slower heart rate. Lower blood pressure. Clearer and healthier skin. The ability to breathe more easily. Fewer sick days. Summary Quitting smoking can be very challenging. Do not get discouraged if you are not successful the first time. Some people need to make many attempts to quit before they achieve long-term success. When you decide to quit smoking, create a plan to help you succeed. Quit smoking right away, not slowly over a period of time. Find resources and support systems that can help you quit smoking and remain smoke-free after you quit. This information is not intended to replace advice given to you by your health care provider. Make sure you discuss any questions you have with your health care provider. Document Revised: 07/15/2021 Document Reviewed: 07/15/2021 Elsevier Patient Education  [place]

## 2022-04-23 ENCOUNTER — Other Ambulatory Visit: Payer: Self-pay | Admitting: Family Medicine

## 2022-04-23 DIAGNOSIS — E039 Hypothyroidism, unspecified: Secondary | ICD-10-CM

## 2022-04-24 ENCOUNTER — Other Ambulatory Visit: Payer: Self-pay | Admitting: Family Medicine

## 2022-04-24 ENCOUNTER — Other Ambulatory Visit: Payer: Self-pay

## 2022-07-30 ENCOUNTER — Other Ambulatory Visit: Payer: Self-pay | Admitting: Family Medicine

## 2022-07-30 DIAGNOSIS — E039 Hypothyroidism, unspecified: Secondary | ICD-10-CM

## 2022-08-26 ENCOUNTER — Other Ambulatory Visit: Payer: Self-pay | Admitting: Family Medicine

## 2022-08-26 DIAGNOSIS — E039 Hypothyroidism, unspecified: Secondary | ICD-10-CM

## 2022-08-30 ENCOUNTER — Other Ambulatory Visit: Payer: Self-pay | Admitting: Family Medicine

## 2022-08-30 DIAGNOSIS — E039 Hypothyroidism, unspecified: Secondary | ICD-10-CM

## 2022-09-09 ENCOUNTER — Other Ambulatory Visit: Payer: Self-pay | Admitting: Family Medicine

## 2022-09-15 ENCOUNTER — Ambulatory Visit: Payer: BC Managed Care – PPO | Admitting: Family Medicine

## 2022-09-15 ENCOUNTER — Encounter: Payer: Self-pay | Admitting: Family Medicine

## 2022-09-15 VITALS — BP 134/80 | HR 76 | Temp 97.8°F | Ht 68.0 in | Wt 142.8 lb

## 2022-09-15 DIAGNOSIS — I10 Essential (primary) hypertension: Secondary | ICD-10-CM

## 2022-09-15 DIAGNOSIS — E785 Hyperlipidemia, unspecified: Secondary | ICD-10-CM

## 2022-09-15 DIAGNOSIS — Z79899 Other long term (current) drug therapy: Secondary | ICD-10-CM

## 2022-09-15 DIAGNOSIS — E039 Hypothyroidism, unspecified: Secondary | ICD-10-CM

## 2022-09-15 DIAGNOSIS — I482 Chronic atrial fibrillation, unspecified: Secondary | ICD-10-CM

## 2022-09-15 LAB — CBC WITH DIFFERENTIAL/PLATELET
Basophils Absolute: 0 10*3/uL (ref 0.0–0.1)
Basophils Relative: 1.5 % (ref 0.0–3.0)
Eosinophils Absolute: 0 10*3/uL (ref 0.0–0.7)
Eosinophils Relative: 1.7 % (ref 0.0–5.0)
HCT: 48.7 % (ref 39.0–52.0)
Hemoglobin: 16.5 g/dL (ref 13.0–17.0)
Lymphocytes Relative: 57.5 % — ABNORMAL HIGH (ref 12.0–46.0)
Lymphs Abs: 1.2 10*3/uL (ref 0.7–4.0)
MCHC: 33.9 g/dL (ref 30.0–36.0)
MCV: 98.7 fl (ref 78.0–100.0)
Monocytes Absolute: 0.6 10*3/uL (ref 0.1–1.0)
Monocytes Relative: 29.1 % — ABNORMAL HIGH (ref 3.0–12.0)
Neutro Abs: 0.2 10*3/uL — ABNORMAL LOW (ref 1.4–7.7)
Neutrophils Relative %: 10.2 % — ABNORMAL LOW (ref 43.0–77.0)
Platelets: 201 10*3/uL (ref 150.0–400.0)
RBC: 4.93 Mil/uL (ref 4.22–5.81)
RDW: 13.9 % (ref 11.5–15.5)
WBC: 2.1 10*3/uL — ABNORMAL LOW (ref 4.0–10.5)

## 2022-09-15 LAB — COMPREHENSIVE METABOLIC PANEL
ALT: 19 U/L (ref 0–53)
AST: 23 U/L (ref 0–37)
Albumin: 4.1 g/dL (ref 3.5–5.2)
Alkaline Phosphatase: 79 U/L (ref 39–117)
BUN: 10 mg/dL (ref 6–23)
CO2: 29 mEq/L (ref 19–32)
Calcium: 9.3 mg/dL (ref 8.4–10.5)
Chloride: 99 mEq/L (ref 96–112)
Creatinine, Ser: 0.85 mg/dL (ref 0.40–1.50)
GFR: 90.36 mL/min (ref >60.00)
Glucose, Bld: 88 mg/dL (ref 70–99)
Potassium: 4.2 mEq/L (ref 3.5–5.1)
Sodium: 137 mEq/L (ref 135–145)
Total Bilirubin: 0.6 mg/dL (ref 0.2–1.2)
Total Protein: 7.6 g/dL (ref 6.0–8.3)

## 2022-09-15 LAB — TSH: TSH: 7.94 u[IU]/mL — ABNORMAL HIGH (ref 0.35–5.50)

## 2022-09-15 MED ORDER — LEVOTHYROXINE SODIUM 112 MCG PO TABS: 112.0000 ug | ORAL_TABLET | Freq: Every day | ORAL | 3 refills | Status: DC

## 2022-09-15 NOTE — Progress Notes (Signed)
Established Patient Office Visit  Subjective   Patient ID: Adrian [name], male    DOB: 1956-07-24  Age: 67 y.o. MRN: [id number]  Chief Complaint  Patient presents with   Medication Consultation    HPI   Adrian [name] is seen for medical follow-up.  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  Low motivation to quit smoking.  He is generally doing well.  He saw cardiology last July and had lipids done which were stable.  He is looking at planned retirement from [place] about 5 months.  Medications reviewed.  He remains on Eliquis 5 mg twice daily, atorvastatin 40 mg daily, levothyroxine 112 mcg daily, and losartan 50 mg daily.  No recent dizziness.  No chest pains.  No focal weakness.  Appetite and weight stable.  His weight is up from 134 pounds 9/22.  He is pleased with the weight gain and hopes to gain more.  Past Medical History:  Diagnosis Date   AF (paroxysmal atrial fibrillation) ([place]) 11/29/2018   Atrial flutter ([place]) 10/12/2015   CAD (coronary artery disease), native coronary artery 12/06/2015   a. 10/2015 MV: EF  37%, reversible defect inferior apex, intermediate risk findings; b. 10/2015 Cath: 20% mid RCA.   Colonic diverticular abscess    Diverticulitis    History of chemotherapy 2005   Cisplatin   Hypothyroidism    NICM (nonischemic cardiomyopathy) ([place]) 10/14/2015   a. Tachy mediated?;  b. Echo 3/17 - Mild concentric LVH, EF 30-35%, anteroseptal, anterior, anterolateral, apical anterior, lateral hypokinesis, trivial MR, mild to moderately reduced RVSF; c. LHC 3/17 - mRCA 20%   Paroxysmal atrial flutter ([place])    a. TEE 3/17 with ? LAA clot-->s/p TEE/DCCV 11/24/2015   Radiation NOv.3,2005-Dec. 15, 2005   6810 cGy in 30 fractions   Tonsil cancer [facility]) 2005   Dr Romeo Rabon.  XRT   Past  Surgical History:  Procedure Laterality Date   APPENDECTOMY N/A 03/07/2019   Procedure: ROBOTIC ASSISTED APPENDECTOMY;  Surgeon: Michael Boston, MD;  [facility];  Service: General;  Laterality: N/A;   CARDIAC CATHETERIZATION N/A 10/18/2015   Procedure: Left Heart Cath and Coronary Angiography;  Surgeon: Burnell Blanks, MD;  Location: [place] CV LAB;  Service: Cardiovascular;  Laterality: N/A;   CARDIOVERSION N/A 11/24/2015   Procedure: CARDIOVERSION;  Surgeon: Josue Hector, MD;  Location: [place];  Service: Cardiovascular;  Laterality: N/A;   CYSTOSCOPY WITH STENT PLACEMENT Bilateral 03/07/2019   Procedure: CYSTOSCOPY WITH BILATERAL FIREFLY INJECTION;  Surgeon: Ardis Hughs, MD;  [facility];  Service: Urology;  Laterality: Bilateral;   GASTROSTOMY TUBE PLACEMENT  07/04/2004   IR - G tube for tonsilar cancer   IR ANGIO INTRA EXTRACRAN SEL COM CAROTID INNOMINATE UNI R MOD SED  03/09/2019   IR ANGIO VERTEBRAL SEL VERTEBRAL UNI R MOD SED  03/09/2019   IR CT HEAD LTD  03/09/2019   IR PERCUTANEOUS ART THROMBECTOMY/INFUSION INTRACRANIAL INC DIAG ANGIO  03/09/2019   IR RADIOLOGIST EVAL & MGMT  12/18/2018   LAMINECTOMY     C5/placement of steel plate   NECK SURGERY  2003   replaced disk   RADIOLOGY WITH ANESTHESIA N/A 03/08/2019   Procedure: IR WITH ANESTHESIA;  Surgeon: Luanne Bras, MD;  Location: [place];  Service: Radiology;  Laterality: N/A;   TEE WITHOUT CARDIOVERSION N/A 10/15/2015  Procedure: TRANSESOPHAGEAL ECHOCARDIOGRAM (TEE);  Surgeon: Larey Dresser, MD;  Location: [place];  Service: Cardiovascular;  Laterality: N/A;   TEE WITHOUT CARDIOVERSION N/A 11/24/2015   Procedure: TRANSESOPHAGEAL ECHOCARDIOGRAM (TEE);  Surgeon: Josue Hector, MD;  Location: [place];  Service: Cardiovascular;  Laterality: N/A;   XI ROBOTIC ASSISTED COLOSTOMY TAKEDOWN N/A 03/07/2019   Procedure: XI ROBOTIC ASSISTED LOW ANTERIOR RESECTION, RIGID PROCTOSCOPY;  Surgeon: Michael Boston, MD;  [facility];  Service: General;  Laterality: N/A;    reports that he has been smoking cigarettes. He has a 10.00 pack-year smoking history. He has never used smokeless tobacco. He reports current alcohol use of about 12.0 standard drinks of alcohol per week. He reports that he does not use drugs. family history includes Cancer in his brother; Colon cancer in his father; Prostate cancer in his father; Skin cancer in his mother. No Known Allergies  Review of Systems  Constitutional:  Negative for chills, fever and malaise/fatigue.  Eyes:  Negative for blurred vision.  Respiratory:  Negative for shortness of breath.   Cardiovascular:  Negative for chest pain.  Gastrointestinal:  Negative for abdominal pain.  Neurological:  Negative for dizziness, weakness and headaches.      Objective:     BP 134/80 (BP Location: Left Arm, Patient Position: Sitting, Cuff Size: Normal)   Pulse 76   Temp 97.8 F (36.6 C) (Oral)   Ht 5' 8"$  (1.727 m)   Wt 142 lb 12.8 oz (64.8 kg)   SpO2 98%   BMI 21.71 kg/m    Physical Exam Vitals reviewed.  Constitutional:      Appearance: He is well-developed.  Eyes:     Pupils: Pupils are equal, round, and reactive to light.  Neck:     Thyroid: No thyromegaly.  Cardiovascular:     Comments: Irregularly irregular rhythm with rate around 80. Pulmonary:     Effort: Pulmonary effort is normal. No respiratory distress.     Breath sounds: Normal breath sounds. No wheezing or rales.  Musculoskeletal:     Cervical back: Neck supple.     Right lower leg: No edema.     Left lower leg: No edema.  Neurological:     Mental Status: He is alert and oriented to person, place, and time.      No results found for any visits on 09/15/22.    The ASCVD Risk score (Arnett DK, et al., 2019) failed to calculate for the following reasons:   The patient has a prior MI or stroke diagnosis    Assessment & Plan:   #1 hypothyroidism.  Patient on  levothyroxine 112 mcg daily.  Overdue for labs.  Recheck TSH today.  Refill levothyroxine for 1 year  #2 history of chronic atrial fibrillation.  Appears to be back in A-fib at this time.  He is maintained on Eliquis 5 mg twice daily.  Continue close follow-up with cardiology.  Check renal function and CBC with chronic Eliquis use  #3 hypertension currently stable on losartan 50 mg daily.  #4 hyperlipidemia treated with atorvastatin 40 mg daily.  He had lipids checked last July per cardiology and these were reviewed and fairly well-controlled.  #5 ongoing nicotine use.  We have strongly recommend he stop smoking altogether.  His current motivation is relatively low.  We also discussed low-dose CT lung cancer screening and he was given a handout on this and will consider.   No follow-ups on file.    Carolann Littler, MD

## 2022-09-19 MED ORDER — LEVOTHYROXINE SODIUM 125 MCG PO TABS: 125.0000 ug | ORAL_TABLET | Freq: Every day | ORAL | 0 refills | Status: DC

## 2022-09-19 NOTE — Addendum Note (Signed)
Addended by: Nilda Riggs on: 09/19/2022 08:39 AM   Modules accepted: Orders

## 2022-10-14 ENCOUNTER — Other Ambulatory Visit: Payer: Self-pay | Admitting: Family Medicine

## 2022-12-15 ENCOUNTER — Telehealth: Payer: Self-pay | Admitting: Family Medicine

## 2022-12-15 MED ORDER — LEVOTHYROXINE SODIUM 125 MCG PO TABS: 125.0000 ug | ORAL_TABLET | Freq: Every day | ORAL | 0 refills | Status: DC

## 2022-12-15 MED ORDER — ATORVASTATIN CALCIUM 40 MG PO TABS: 40.0000 mg | ORAL_TABLET | Freq: Every day | ORAL | 0 refills | Status: DC

## 2022-12-15 NOTE — Telephone Encounter (Signed)
Rx sent 

## 2022-12-15 NOTE — Telephone Encounter (Signed)
Prescription Request  12/15/2022  LOV: 09/15/2022  What is the name of the medication or equipment? levothyroxine (SYNTHROID) 125 MCG tablet atorvastatin (LIPITOR) 40 MG tablet  Have you contacted your pharmacy to request a refill? No   Which pharmacy would you like this sent to?  [id number] [place], [place] - [address] [place] [address] Phone: [phone] Fax: [phone]    Patient notified that their request is being sent to the clinical staff for review and that they should receive a response within 2 business days.   Please advise at Mobile [phone] (mobile)

## 2022-12-15 NOTE — Addendum Note (Signed)
Addended by: Christy Sartorius on: 12/15/2022 01:14 PM   Modules accepted: Orders

## 2022-12-17 ENCOUNTER — Other Ambulatory Visit: Payer: Self-pay | Admitting: Family Medicine

## 2022-12-19 ENCOUNTER — Emergency Department (HOSPITAL_COMMUNITY): Payer: BC Managed Care – PPO

## 2022-12-19 ENCOUNTER — Encounter: Payer: Self-pay | Admitting: Cardiology

## 2022-12-19 ENCOUNTER — Other Ambulatory Visit: Payer: Self-pay

## 2022-12-19 ENCOUNTER — Observation Stay (HOSPITAL_COMMUNITY)
Admission: EM | Admit: 2022-12-19 | Discharge: 2022-12-20 | Disposition: A | Discharge status: Home / Self Care | Payer: BC Managed Care – PPO | Attending: Cardiology | Admitting: Cardiology

## 2022-12-19 ENCOUNTER — Encounter (HOSPITAL_COMMUNITY): Payer: Self-pay

## 2022-12-19 ENCOUNTER — Observation Stay (HOSPITAL_COMMUNITY): Payer: BC Managed Care – PPO

## 2022-12-19 ENCOUNTER — Ambulatory Visit: Payer: BC Managed Care – PPO | Attending: Cardiology | Admitting: Cardiology

## 2022-12-19 VITALS — BP 118/86 | HR 75 | Ht 68.0 in | Wt 138.2 lb

## 2022-12-19 DIAGNOSIS — R0602 Shortness of breath: Secondary | ICD-10-CM | POA: Diagnosis not present

## 2022-12-19 DIAGNOSIS — Z7901 Long term (current) use of anticoagulants: Secondary | ICD-10-CM | POA: Diagnosis not present

## 2022-12-19 DIAGNOSIS — I4892 Unspecified atrial flutter: Secondary | ICD-10-CM | POA: Diagnosis present

## 2022-12-19 DIAGNOSIS — I10 Essential (primary) hypertension: Secondary | ICD-10-CM

## 2022-12-19 DIAGNOSIS — Z79899 Other long term (current) drug therapy: Secondary | ICD-10-CM | POA: Diagnosis not present

## 2022-12-19 DIAGNOSIS — Z85818 Personal history of malignant neoplasm of other sites of lip, oral cavity, and pharynx: Secondary | ICD-10-CM | POA: Diagnosis not present

## 2022-12-19 DIAGNOSIS — I428 Other cardiomyopathies: Secondary | ICD-10-CM | POA: Diagnosis not present

## 2022-12-19 DIAGNOSIS — I480 Paroxysmal atrial fibrillation: Secondary | ICD-10-CM | POA: Diagnosis not present

## 2022-12-19 DIAGNOSIS — I2510 Atherosclerotic heart disease of native coronary artery without angina pectoris: Secondary | ICD-10-CM | POA: Insufficient documentation

## 2022-12-19 DIAGNOSIS — F17210 Nicotine dependence, cigarettes, uncomplicated: Secondary | ICD-10-CM | POA: Insufficient documentation

## 2022-12-19 DIAGNOSIS — I420 Dilated cardiomyopathy: Secondary | ICD-10-CM | POA: Diagnosis not present

## 2022-12-19 DIAGNOSIS — E785 Hyperlipidemia, unspecified: Secondary | ICD-10-CM | POA: Diagnosis not present

## 2022-12-19 DIAGNOSIS — R0600 Dyspnea, unspecified: Secondary | ICD-10-CM

## 2022-12-19 DIAGNOSIS — E039 Hypothyroidism, unspecified: Secondary | ICD-10-CM | POA: Insufficient documentation

## 2022-12-19 LAB — CBC
HCT: 46.7 % (ref 39.0–52.0)
Hemoglobin: 16.1 g/dL (ref 13.0–17.0)
MCH: 32.5 pg (ref 26.0–34.0)
MCHC: 34.5 g/dL (ref 30.0–36.0)
MCV: 94.3 fL (ref 80.0–100.0)
Platelets: 137 10*3/uL — ABNORMAL LOW (ref 150–400)
RBC: 4.95 MIL/uL (ref 4.22–5.81)
RDW: 13.7 % (ref 11.5–15.5)
WBC: 2.3 10*3/uL — ABNORMAL LOW (ref 4.0–10.5)
nRBC: 0 % (ref 0.0–0.2)

## 2022-12-19 LAB — BASIC METABOLIC PANEL
Anion gap: 11 (ref 5–15)
BUN: 14 mg/dL (ref 8–23)
CO2: 24 mmol/L (ref 22–32)
Calcium: 9.4 mg/dL (ref 8.9–10.3)
Chloride: 102 mmol/L (ref 98–111)
Creatinine, Ser: 0.91 mg/dL (ref 0.61–1.24)
GFR, Estimated: >60 mL/min (ref >60)
Glucose, Bld: 91 mg/dL (ref 70–99)
Potassium: 4.8 mmol/L (ref 3.5–5.1)
Sodium: 137 mmol/L (ref 135–145)

## 2022-12-19 LAB — BRAIN NATRIURETIC PEPTIDE: B Natriuretic Peptide: 92.1 pg/mL (ref 0.0–100.0)

## 2022-12-19 LAB — TSH: TSH: 1.541 u[IU]/mL (ref 0.350–4.500)

## 2022-12-19 LAB — MRSA NEXT GEN BY PCR, NASAL: MRSA by PCR Next Gen: NOT DETECTED

## 2022-12-19 LAB — HIV ANTIBODY (ROUTINE TESTING W REFLEX): HIV Screen 4th Generation wRfx: NONREACTIVE

## 2022-12-19 MED ORDER — SODIUM CHLORIDE 0.9 % IV SOLN: INTRAVENOUS | Status: DC

## 2022-12-19 MED ORDER — METOPROLOL TARTRATE 5 MG/5ML IV SOLN: 5.0000 mg | Freq: Once | INTRAVENOUS | Status: DC

## 2022-12-19 MED ORDER — METOPROLOL TARTRATE 5 MG/5ML IV SOLN
5.0000 mg | Freq: Once | INTRAVENOUS | Status: AC
  Administered 2022-12-19: 5 mg via INTRAVENOUS
  Filled 2022-12-19: qty 5

## 2022-12-19 MED ORDER — ONDANSETRON HCL 4 MG/2ML IJ SOLN: 4.0000 mg | Freq: Four times a day (QID) | INTRAMUSCULAR | Status: DC | PRN

## 2022-12-19 MED ORDER — ORAL CARE MOUTH RINSE: 15.0000 mL | OROMUCOSAL | Status: DC | PRN

## 2022-12-19 MED ORDER — METOPROLOL TARTRATE 25 MG PO TABS
25.0000 mg | ORAL_TABLET | Freq: Two times a day (BID) | ORAL | Status: DC
  Administered 2022-12-19 – 2022-12-20 (×3): 25 mg via ORAL
  Filled 2022-12-19 (×3): qty 1

## 2022-12-19 MED ORDER — IOHEXOL 350 MG/ML SOLN
75.0000 mL | Freq: Once | INTRAVENOUS | Status: AC | PRN
  Administered 2022-12-19: 75 mL via INTRAVENOUS

## 2022-12-19 MED ORDER — ACETAMINOPHEN 325 MG PO TABS: 650.0000 mg | ORAL_TABLET | ORAL | Status: DC | PRN

## 2022-12-19 MED ORDER — APIXABAN 5 MG PO TABS
5.0000 mg | ORAL_TABLET | Freq: Two times a day (BID) | ORAL | Status: DC
  Administered 2022-12-19 – 2022-12-20 (×2): 5 mg via ORAL
  Filled 2022-12-19 (×2): qty 1

## 2022-12-19 NOTE — ED Triage Notes (Signed)
Pt sent by cardiologist, because he was in A-fib w/RVR in their office. Pt c/o weakness and SOB w/exertion.

## 2022-12-19 NOTE — ED Notes (Signed)
ED TO INPATIENT HANDOFF REPORT  ED Nurse Name and Phone #:  [phone]  S Name/Age/Gender Adrian [name] 67 y.o. male Room/Bed: 039C/039C  Code Status   Code Status: Full Code  Home/SNF/Other Home Patient oriented to: self, place, time, and situation Is this baseline? Yes   Triage Complete: Triage complete  Chief Complaint Atrial flutter [facility]) [I48.92]  Triage Note Pt sent by cardiologist, because he was in A-fib w/RVR in their office. Pt c/o weakness and SOB w/exertion.    Allergies No Known Allergies  Level of Care/Admitting Diagnosis ED Disposition     ED Disposition  Admit   Condition  --   Comment  Hospital Area: [facility] [[id number]]  Level of Care: Telemetry Cardiac [[id number]]  May place patient in observation at [facility] or [facility] if equivalent level of care is available:: No  Covid Evaluation: Asymptomatic - no recent exposure (last 10 days) testing not required  Diagnosis: Atrial flutter ([facility]) [427.32.ICD-9-CM]  Admitting Physician: Quintella Reichert [[id number]]  Attending Physician: Quintella Reichert [phone]          B Medical/Surgery History Past Medical History:  Diagnosis Date   AF (paroxysmal atrial fibrillation) ([facility]) 11/29/2018   Atrial flutter ([facility]) 10/12/2015   CAD (coronary artery disease), native coronary artery 12/06/2015   a. 10/2015 MV: EF  37%, reversible defect inferior apex, intermediate risk findings; b. 10/2015 Cath: 20% mid RCA.   Colonic diverticular abscess    Diverticulitis    History of chemotherapy 2005   Cisplatin   Hypothyroidism    NICM (nonischemic cardiomyopathy) ([facility]) 10/14/2015   a. Tachy mediated?;  b. Echo 3/17 - Mild concentric LVH, EF 30-35%, anteroseptal, anterior, anterolateral, apical anterior, lateral hypokinesis, trivial MR, mild to moderately reduced RVSF; c. LHC 3/17 - mRCA 20%   Paroxysmal atrial flutter ([facility])    a. TEE 3/17 with ? LAA clot-->s/p TEE/DCCV 11/24/2015   Radiation NOv.3,2005-Dec. 15, 2005    6810 cGy in 30 fractions   Tonsil cancer [facility]) 2005   Dr Otila Kluver.  XRT   Past Surgical History:  Procedure Laterality Date   APPENDECTOMY N/A 03/07/2019   Procedure: ROBOTIC ASSISTED APPENDECTOMY;  Surgeon: Karie Soda, MD;  [facility];  Service: General;  Laterality: N/A;   CARDIAC CATHETERIZATION N/A 10/18/2015   Procedure: Left Heart Cath and Coronary Angiography;  Surgeon: Kathleene Hazel, MD;  Location: [facility] INVASIVE CV LAB;  Service: Cardiovascular;  Laterality: N/A;   CARDIOVERSION N/A 11/24/2015   Procedure: CARDIOVERSION;  Surgeon: Wendall Stade, MD;  Location: [facility] ENDOSCOPY;  Service: Cardiovascular;  Laterality: N/A;   CYSTOSCOPY WITH STENT PLACEMENT Bilateral 03/07/2019   Procedure: CYSTOSCOPY WITH BILATERAL FIREFLY INJECTION;  Surgeon: Crist Fat, MD;  [facility];  Service: Urology;  Laterality: Bilateral;   GASTROSTOMY TUBE PLACEMENT  07/04/2004   IR - G tube for tonsilar cancer   IR ANGIO INTRA EXTRACRAN SEL COM CAROTID INNOMINATE UNI R MOD SED  03/09/2019   IR ANGIO VERTEBRAL SEL VERTEBRAL UNI R MOD SED  03/09/2019   IR CT HEAD LTD  03/09/2019   IR PERCUTANEOUS ART THROMBECTOMY/INFUSION INTRACRANIAL INC DIAG ANGIO  03/09/2019   IR RADIOLOGIST EVAL & MGMT  12/18/2018   LAMINECTOMY     C5/placement of steel plate   NECK SURGERY  2003   replaced disk   RADIOLOGY WITH ANESTHESIA N/A 03/08/2019   Procedure: IR WITH ANESTHESIA;  Surgeon: Julieanne Cotton, MD;  Location[facility];  Service: Radiology;  Laterality: N/A;   TEE WITHOUT CARDIOVERSION N/A 10/15/2015   Procedure: TRANSESOPHAGEAL ECHOCARDIOGRAM (TEE);  Surgeon: Laurey Morale, MD;  Location: [facility] ENDOSCOPY;  Service: Cardiovascular;  Laterality: N/A;   TEE WITHOUT CARDIOVERSION N/A 11/24/2015   Procedure: TRANSESOPHAGEAL ECHOCARDIOGRAM (TEE);  Surgeon: Wendall Stade, MD;  Location: [facility] ENDOSCOPY;  Service: Cardiovascular;  Laterality: N/A;   XI ROBOTIC ASSISTED COLOSTOMY TAKEDOWN N/A 03/07/2019    Procedure: XI ROBOTIC ASSISTED LOW ANTERIOR RESECTION, RIGID PROCTOSCOPY;  Surgeon: Karie Soda, MD;  [facility];  Service: General;  Laterality: N/A;     A IV Location/Drains/Wounds Patient Lines/Drains/Airways Status     Active Line/Drains/Airways     Name Placement date Placement time Site Days   Peripheral IV 12/19/22 20 G Right Antecubital 12/19/22  1152  Antecubital  less than 1   Incision (Closed) 03/07/19 Abdomen Other (Comment) 03/07/19  1441  -- 1383   Incision - 5 Ports Abdomen Right;Lateral Right;Medial Mid Right;Lower Left 03/07/19  1245  -- 1383   Nasoenteric Feeding Tube Cortrak - 43 inches Left nare Documented cm marking at nare/ corner of mouth 70 cm 03/10/19  1329  Left nare  1380   Wound / Incision (Open or Dehisced) 03/09/19 Puncture Thigh Anterior;Proximal;Right 03/09/19  0241  Thigh  1381   Wound / Incision (Open or Dehisced) 03/09/19 Puncture Thigh Anterior;Left;Proximal 03/09/19  0245  Thigh  1381            Intake/Output Last 24 hours No intake or output data in the 24 hours ending 12/19/22 1315  Labs/Imaging Results for orders placed or performed during the hospital encounter of 12/19/22 (from the past 48 hour(s))  Basic metabolic panel     Status: None   Collection Time: 12/19/22 11:52 AM  Result Value Ref Range   Sodium 137 135 - 145 mmol/L   Potassium 4.8 3.5 - 5.1 mmol/L    Comment: HEMOLYSIS AT THIS LEVEL MAY AFFECT RESULT   Chloride 102 98 - 111 mmol/L   CO2 24 22 - 32 mmol/L   Glucose, Bld 91 70 - 99 mg/dL    Comment: Glucose reference range applies only to samples taken after fasting for at least 8 hours.   BUN 14 8 - 23 mg/dL   Creatinine, Ser 1.61 0.61 - 1.24 mg/dL   Calcium 9.4 8.9 - 09.6 mg/dL   GFR, Estimated >04 >54 mL/min    Comment: (NOTE) Calculated using the CKD-EPI Creatinine Equation (2021)    Anion gap 11 5 - 15    Comment: Performed at [facility], [address], [place], [place] [address]  CBC     Status:  Abnormal   Collection Time: 12/19/22 11:52 AM  Result Value Ref Range   WBC 2.3 (L) 4.0 - 10.5 K/uL   RBC 4.95 4.22 - 5.81 MIL/uL   Hemoglobin 16.1 13.0 - 17.0 g/dL   HCT 91.4 78.2 - 95.6 %   MCV 94.3 80.0 - 100.0 fL   MCH 32.5 26.0 - 34.0 pg   MCHC 34.5 30.0 - 36.0 g/dL   RDW 21.3 08.6 - 57.8 %   Platelets 137 (L) 150 - 400 K/uL   nRBC 0.0 0.0 - 0.2 %    Comment: Performed at [facility], [address], [place], [place] [address]   DG Chest Portable 1 View  Result Date: 12/19/2022 CLINICAL DATA:  Tachycardia. EXAM: PORTABLE CHEST 1 VIEW COMPARISON:  10/19/2015. FINDINGS: Clear lungs. Normal heart size and mediastinal  contours. No pleural effusion or pneumothorax. Visualized bones and upper abdomen are unremarkable. IMPRESSION: No evidence of acute cardiopulmonary disease. Electronically Signed   By: Orvan Falconer M.D.   On: 12/19/2022 12:01    Pending Labs Unresulted Labs (From admission, onward)     Start     Ordered   12/20/22 0500  Basic metabolic panel  Tomorrow morning,   R        12/19/22 1118   12/20/22 0500  CBC  Tomorrow morning,   R        12/19/22 1118   12/19/22 1213  Brain natriuretic peptide  Once,   R        12/19/22 1212   12/19/22 1116  HIV Antibody (routine testing w rflx)  (HIV Antibody (Routine testing w reflex) panel)  Once,   R        12/19/22 1118   12/19/22 1053  TSH  Once,   URGENT        12/19/22 1052            Vitals/Pain Today's Vitals   12/19/22 1037 12/19/22 1038  BP: (!) 147/116   Pulse: (!) 132   Resp: (!) 22   Temp: 97.6 F (36.4 C)   TempSrc: Oral   SpO2: 99%   Weight:  62.7 kg  Height:  5\' 8"  (1.727 m)  PainSc:  0-No pain    Isolation Precautions No active isolations  Medications Medications  acetaminophen (TYLENOL) tablet 650 mg (has no administration in time range)  ondansetron (ZOFRAN) injection 4 mg (has no administration in time range)  apixaban (ELIQUIS) tablet 5 mg (has no administration in time range)   metoprolol tartrate (LOPRESSOR) tablet 25 mg (25 mg Oral Given 12/19/22 1203)    Mobility walks     Focused Assessments Cardiac Assessment Handoff:    Lab Results  Component Value Date   TROPONINI 0.03 10/12/2015   No results found for: "DDIMER" Does the Patient currently have chest pain? No    R Recommendations: See Admitting Provider Note  Report given to:   Additional Notes:

## 2022-12-19 NOTE — ED Provider Notes (Signed)
[place] EMERGENCY DEPARTMENT AT [facility] Provider Note   CSN: [id number] Arrival date & time: 12/19/22  1022     History  Chief Complaint  Patient presents with   Atrial Fibrillation    TREGAN WHITFIELD is a 67 y.o. male.  Six 42-year-old male with history of PAF presents with 2 days of increased shortness of breath.  Patient does take Eliquis but there has been some compliance issues.  Patient denies any chest pain.  Denies any orthopnea.  No lower extremity edema.  No severe chest pressure noted.  No fever or chills.  No cough congestion.  Saw his cardiology today, Dr. Mayford Knife, who found patient to be in A-fib with RVR.  Sent patient in here for admission       Home Medications Prior to Admission medications   Medication Sig Start Date End Date Taking? Authorizing Provider  apixaban (ELIQUIS) 5 MG TABS tablet TAKE 1 TABLET BY MOUTH TWICE A DAY 10/16/22   Burchette, Elberta Fortis, MD  atorvastatin (LIPITOR) 40 MG tablet Take 1 tablet (40 mg total) by mouth daily. 12/15/22   Burchette, Elberta Fortis, MD  Ibuprofen 200 MG CAPS Take 400 mg by mouth daily as needed (Pain). PRN OTC Patient not taking: Reported on 12/19/2022    [provider]  levothyroxine (SYNTHROID) 125 MCG tablet Take 1 tablet (125 mcg total) by mouth daily. 12/15/22   Burchette, Elberta Fortis, MD  losartan (COZAAR) 50 MG tablet Take 50 mg by mouth daily.    [provider]      Allergies    Patient has no known allergies.    Review of Systems   Review of Systems  All other systems reviewed and are negative.   Physical Exam Updated Vital Signs BP (!) 147/116   Pulse (!) 132   Temp 97.6 F (36.4 C) (Oral)   Resp (!) 22   Ht 1.727 m (5\' 8" )   Wt 62.7 kg   SpO2 99%   BMI 21.02 kg/m  Physical Exam Vitals and nursing note reviewed.  Constitutional:      General: He is not in acute distress.    Appearance: Normal appearance. He is well-developed. He is not toxic-appearing.  HENT:     Head:  Normocephalic and atraumatic.  Eyes:     General: Lids are normal.     Conjunctiva/sclera: Conjunctivae normal.     Pupils: Pupils are equal, round, and reactive to light.  Neck:     Thyroid: No thyroid mass.     Trachea: No tracheal deviation.  Cardiovascular:     Rate and Rhythm: Regular rhythm. Tachycardia present.     Heart sounds: Normal heart sounds. No murmur heard.    No gallop.  Pulmonary:     Effort: Pulmonary effort is normal. No respiratory distress.     Breath sounds: Normal breath sounds. No stridor. No decreased breath sounds, wheezing, rhonchi or rales.  Abdominal:     General: There is no distension.     Palpations: Abdomen is soft.     Tenderness: There is no abdominal tenderness. There is no rebound.  Musculoskeletal:        General: No tenderness. Normal range of motion.     Cervical back: Normal range of motion and neck supple.  Skin:    General: Skin is warm and dry.     Findings: No abrasion or rash.  Neurological:     Mental Status: He is alert and oriented to  person, place, and time. Mental status is at baseline.     GCS: GCS eye subscore is 4. GCS verbal subscore is 5. GCS motor subscore is 6.     Cranial Nerves: No cranial nerve deficit.     Sensory: No sensory deficit.     Motor: Motor function is intact.  Psychiatric:        Attention and Perception: Attention normal.        Speech: Speech normal.        Behavior: Behavior normal.     ED Results / Procedures / Treatments   Labs (all labs ordered are listed, but only abnormal results are displayed) Labs Reviewed  BASIC METABOLIC PANEL  CBC  TROPONIN I (HIGH SENSITIVITY)    EKG EKG Interpretation  Date/Time:  Tuesday Dec 19 2022 10:35:13 EDT Ventricular Rate:  132 PR Interval:  120 QRS Duration: 91 QT Interval:  346 QTC Calculation: 513 R Axis:   -82 Text Interpretation: Sinus tachycardia Left anterior fascicular block RSR' in V1 or V2, probably normal variant Consider left  ventricular hypertrophy ST depr, consider ischemia, inferior leads Minimal ST elevation, lateral leads Prolonged QT interval Confirmed by Lorre Nick ([id number]) on 12/19/2022 10:36:56 AM  Radiology No results found.  Procedures Procedures    Medications Ordered in ED Medications  metoprolol tartrate (LOPRESSOR) injection 5 mg (has no administration in time range)    ED Course/ Medical Decision Making/ A&P                             Medical Decision Making Amount and/or Complexity of Data Reviewed Labs: ordered. Radiology: ordered.  Risk Prescription drug management. Decision regarding hospitalization.  His EKG per my interpretation shows sinus tachycardia.  Patient will be given Lopressor 5 mg.  Also concern about possible pulmonary embolus and CT of the chest has been ordered as he is tachycardic.  Pending at this time.  Discussed with cardiology who will come and admit the patient  CRITICAL CARE Performed by: Toy Baker Total critical care time: 45 minutes Critical care time was exclusive of separately billable procedures and treating other patients. Critical care was necessary to treat or prevent imminent or life-threatening deterioration. Critical care was time spent personally by me on the following activities: development of treatment plan with patient and/or surrogate as well as nursing, discussions with consultants, evaluation of patient's response to treatment, examination of patient, obtaining history from patient or surrogate, ordering and performing treatments and interventions, ordering and review of laboratory studies, ordering and review of radiographic studies, pulse oximetry and re-evaluation of patient's condition.          Final Clinical Impression(s) / ED Diagnoses Final diagnoses:  None    Rx / DC Orders ED Discharge Orders     None         Lorre Nick, MD 12/19/22 1108

## 2022-12-19 NOTE — Progress Notes (Signed)
Cardiology Office Note:    Date:  12/19/2022   ID:  Adrian [name], DOB 1956/03/13, MRN [id number]  PCP:  Kristian Covey, MD  Cardiologist:  Armanda Magic, MD    Referring MD: Kristian Covey, MD   Chief Complaint  Patient presents with   Atrial Fibrillation   Cardiomyopathy   Hyperlipidemia   Coronary Artery Disease    History of Present Illness:    Adrian [name] is a 67 y.o. male with a hx of atrial flutter in the setting of acute diverticulitis, nonischemic cardiomyopathy LVEF 30 to 35%, nonobstructive CAD on cath in 2017, PAF on Eliquis for CHA2DS2-VASc of 2.  2D echo 03/09/2019 normal LVEF 60 to 65%.  He is here today for followup and is doing well.  He tells me that about 2 days ago he was mowing his yard and could not go very far because he would get worn out.  He also has been noticing that he feels very fatigued and worn out for the past 3 weeks.  He has had some mild recent SOB when exerting himself especially when mowing his yard and will have to stop and finish it later.  He recently was seen by his eye doctor and he was concerned that he had swelling under his eyes and may be his heart. He also has noticed dizziness when mowing his yard a few days ago and had to go down on his knees but did pass out. He noticed at that time that his heart was racing as well.  He denies any chest pain or pressure, PND, orthopnea, LE edema or syncope. He is compliant with his meds and is tolerating meds with no SE.    Past Medical History:  Diagnosis Date   AF (paroxysmal atrial fibrillation) (HCC) 11/29/2018   Atrial flutter (HCC) 10/12/2015   CAD (coronary artery disease), native coronary artery 12/06/2015   a. 10/2015 MV: EF  37%, reversible defect inferior apex, intermediate risk findings; b. 10/2015 Cath: 20% mid RCA.   Colonic diverticular abscess    Diverticulitis    History of chemotherapy 2005   Cisplatin   Hypothyroidism    NICM (nonischemic cardiomyopathy) (HCC) 10/14/2015   a. Tachy  mediated?;  b. Echo 3/17 - Mild concentric LVH, EF 30-35%, anteroseptal, anterior, anterolateral, apical anterior, lateral hypokinesis, trivial MR, mild to moderately reduced RVSF; c. LHC 3/17 - mRCA 20%   Paroxysmal atrial flutter (HCC)    a. TEE 3/17 with ? LAA clot-->s/p TEE/DCCV 11/24/2015   Radiation NOv.3,2005-Dec. 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  XRT    Past Surgical History:  Procedure Laterality Date   APPENDECTOMY N/A 03/07/2019   Procedure: ROBOTIC ASSISTED APPENDECTOMY;  Surgeon: Karie Soda, MD;  Location: [facility] ORS;  Service: General;  Laterality: N/A;   CARDIAC CATHETERIZATION N/A 10/18/2015   Procedure: Left Heart Cath and Coronary Angiography;  Surgeon: Kathleene Hazel, MD;  Location: [facility] INVASIVE CV LAB;  Service: Cardiovascular;  Laterality: N/A;   CARDIOVERSION N/A 11/24/2015   Procedure: CARDIOVERSION;  Surgeon: Wendall Stade, MD;  Location: [facility] ENDOSCOPY;  Service: Cardiovascular;  Laterality: N/A;   CYSTOSCOPY WITH STENT PLACEMENT Bilateral 03/07/2019   Procedure: CYSTOSCOPY WITH BILATERAL FIREFLY INJECTION;  Surgeon: Crist Fat, MD;  Location: [facility] ORS;  Service: Urology;  Laterality: Bilateral;   GASTROSTOMY TUBE PLACEMENT  07/04/2004   IR - G tube for tonsilar cancer  IR ANGIO INTRA EXTRACRAN SEL COM CAROTID INNOMINATE UNI R MOD SED  03/09/2019   IR ANGIO VERTEBRAL SEL VERTEBRAL UNI R MOD SED  03/09/2019   IR CT HEAD LTD  03/09/2019   IR PERCUTANEOUS ART THROMBECTOMY/INFUSION INTRACRANIAL INC DIAG ANGIO  03/09/2019   IR RADIOLOGIST EVAL & MGMT  12/18/2018   LAMINECTOMY     C5/placement of steel plate   NECK SURGERY  2003   replaced disk   RADIOLOGY WITH ANESTHESIA N/A 03/08/2019   Procedure: IR WITH ANESTHESIA;  Surgeon: Julieanne Cotton, MD;  Location: [facility] OR;  Service: Radiology;  Laterality: N/A;   TEE WITHOUT CARDIOVERSION N/A 10/15/2015   Procedure: TRANSESOPHAGEAL ECHOCARDIOGRAM (TEE);  Surgeon: Laurey Morale, MD;  Location: [facility] ENDOSCOPY;  Service: Cardiovascular;  Laterality: N/A;   TEE WITHOUT CARDIOVERSION N/A 11/24/2015   Procedure: TRANSESOPHAGEAL ECHOCARDIOGRAM (TEE);  Surgeon: Wendall Stade, MD;  Location: [facility] ENDOSCOPY;  Service: Cardiovascular;  Laterality: N/A;   XI ROBOTIC ASSISTED COLOSTOMY TAKEDOWN N/A 03/07/2019   Procedure: XI ROBOTIC ASSISTED LOW ANTERIOR RESECTION, RIGID PROCTOSCOPY;  Surgeon: Karie Soda, MD;  Location: [facility] ORS;  Service: General;  Laterality: N/A;    Current Medications: Current Meds  Medication Sig   apixaban (ELIQUIS) 5 MG TABS tablet TAKE 1 TABLET BY MOUTH TWICE A DAY   atorvastatin (LIPITOR) 40 MG tablet Take 1 tablet (40 mg total) by mouth daily.   levothyroxine (SYNTHROID) 125 MCG tablet Take 1 tablet (125 mcg total) by mouth daily.   losartan (COZAAR) 50 MG tablet Take 50 mg by mouth daily.     Allergies:   Patient has no known allergies.   Social History   Socioeconomic History   Marital status: Married    Spouse name: Not on file   Number of children: Not on file   Years of education: Not on file   Highest education level: Not on file  Occupational History   Not on file  Tobacco Use   Smoking status: Every Day    Packs/day: 0.50    Years: 20.00    Additional pack years: 0.00    Total pack years: 10.00    Types: Cigarettes   Smokeless tobacco: Never   Tobacco comments:    1/2 pack per day  Vaping Use   Vaping Use: Never used  Substance and Sexual Activity   Alcohol use: Yes    Alcohol/week: 12.0 standard drinks of alcohol    Types: 12 Cans of beer per week    Comment: couple of beers each day   Drug use: No   Sexual activity: Not on file  Other Topics Concern   Not on file  Social History Narrative   Not on file   Social Determinants of Health   Financial Resource Strain: Not on file  Food Insecurity: Not on file  Transportation Needs: Not on file  Physical Activity: Not on file  Stress: Not on file  Social  Connections: Not on file     Family History: The patient's family history includes Cancer in his brother; Colon cancer in his father; Prostate cancer in his father; Skin cancer in his mother. There is no history of Esophageal cancer, Rectal cancer, or Stomach cancer.  ROS:   Please see the history of present illness.    Review of Systems  Musculoskeletal:  Negative for gout.    All other systems reviewed and negative.   EKGs/Labs/Other Studies Reviewed:    The following studies were reviewed today: none  EKG:  EKG is ordered today and showed Atrial flutter with RVR at 132bpm  Recent Labs: 09/15/2022: ALT 19; BUN 10; Creatinine, Ser 0.85; Hemoglobin 16.5; Platelets 201.0; Potassium 4.2; Sodium 137; TSH 7.94   Recent Lipid Panel    Component Value Date/Time   CHOL 142 02/10/2022 0910   TRIG 131 02/10/2022 0910   HDL 59 02/10/2022 0910   CHOLHDL 2.4 02/10/2022 0910   CHOLHDL 3 04/22/2021 0914   VLDL 39.4 04/22/2021 0914   LDLCALC 60 02/10/2022 0910   LDLDIRECT 143.3 06/20/2012 1148    Physical Exam:    VS:  BP 118/86   Pulse 75   Ht 5\' 8"  (1.727 m)   Wt 138 lb 3.2 oz (62.7 kg)   SpO2 96%   BMI 21.01 kg/m     Wt Readings from Last 3 Encounters:  12/19/22 138 lb 3.2 oz (62.7 kg)  09/15/22 142 lb 12.8 oz (64.8 kg)  02/10/22 135 lb (61.2 kg)    GEN: Well nourished, well developed in no acute distress HEENT: Normal NECK: No JVD; No carotid bruits LYMPHATICS: No lymphadenopathy CARDIAC:regular but tachy, no murmurs, rubs, gallops RESPIRATORY:  decreased BS at bases with inspiratory and expiratory wheezes  ABDOMEN: Soft, non-tender, non-distended MUSCULOSKELETAL:  No edema; No deformity  SKIN: Warm and dry NEUROLOGIC:  Alert and oriented x 3 PSYCHIATRIC:  Normal affect   ASSESSMENT:    1. PAF (paroxysmal atrial fibrillation) (HCC)   2. NICM (nonischemic cardiomyopathy) (HCC)   3. Coronary artery disease involving native heart without angina pectoris, unspecified  vessel or lesion type   4. Hyperlipidemia LDL goal <70    PLAN:    In order of problems listed above:  1.  PAF -s/p TEE 3/17 with LAA thrombus -s/p DCCV 11/2015 -This week started noticing exertional fatigue, DOE and palpitations and EKG today shows atrial flutter with RVR -He has not had any bleeding problems on DOAC --I have personally reviewed and interpreted outside labs performed by patient's PCP which showed serum creatinine 0.85 and potassium 4.2 hemoglobin 16.5 on 09/15/2022 -Continue prescription drug management with apixaban 5 mg twice daily with as needed refills -I think he needs to be hospitalized as he is symptomatic and tachy from his atrial flutter to get HR under control and ultimately cardioverted -He has forgotten his Eliquis a few times in the past 4 weeks so will need TEE/DCCV -plan admit to tele bed -start on Lopressor 25mg  BID -check PA and LAt Cxray due to abnormal lung sounds on exam to rule out PNA>>He is a smoker  2.  NIDCM -felt tachy related -no significant CAD on cath 2017 -echo with EF 60-65% 03/2019 -Continue prescription drug management with losartan 50 mg daily with as needed refills  3.  ASCAD -mild nonobstructive CAD by cath 2017 -He has not had any anginal symptoms since I saw him last -no ASA due to DOAC -Continue statin therapy  4.  HLD -LDL goal < 70 -I have personally reviewed and interpreted outside labs performed by patient's PCP which showed LDL 60 and HDL 59 on 02/10/2022 -Continue prescription to manage with atorvastatin 40 mg daily with as needed refills  Medication Adjustments/Labs and Tests Ordered: Current medicines are reviewed at length with the patient today.  Concerns regarding medicines are outlined above.  Orders Placed This Encounter  Procedures   EKG 12-Lead   No orders of the defined types were placed in this encounter.   Signed, Armanda Magic, MD  12/19/2022 9:49 AM  [facility]

## 2022-12-19 NOTE — Patient Instructions (Signed)
Medication Instructions:  Your physician recommends that you continue on your current medications as directed. Please refer to the Current Medication list given to you today.  *If you need a refill on your cardiac medications before your next appointment, please call your pharmacy*  Lab Work: None Ordered  Testing/Procedures: None Ordered  Follow-Up: We will follow-up with you after your visit at [facility]  Other Instructions Please go to [facility] Emergency Department to be evaluated and treated for atrial flutter with RVR.

## 2022-12-19 NOTE — H&P (Signed)
Cardiology Admission History and Physical   Patient ID: Adrian [name] MRN: [id number]; DOB: 05-04-1956   Admission date: 12/19/2022  PCP:  Kristian Covey, MD   [facility] Providers Cardiologist:  Armanda Magic, MD        Chief Complaint:  atrial flutter  Patient Profile:   Adrian [name] is a 67 y.o. male with a hx of atrial flutter in the setting of acute diverticulitis, nonischemic cardiomyopathy LVEF 30 to 35%, nonobstructive CAD on cath in 2017, PAF on Eliquis for CHA2DS2-VASc of 2 who is being admitted 12/19/2022 for the evaluation/management of atrial flutter with RVR.  History of Present Illness:   Mr. Menor was seen in clinic today by Dr. Mayford Knife. This visit had been scheduled after patient saw his ophthalmologist several weeks ago for eye swelling and ophthalmology thought that it might be cardiac related.  Patient reports that he has felt unusually fatigued over the past several weeks.  He also has more acutely experienced shortness of breath with exertion in the last few days such as when mowing his yard.  Patient says that he had to take multiple breaks which is not normal for him.  Patient reports some sensation of dizziness when mowing his yard a couple of days ago but denies syncope.  He did feel a sensation of rapid heartbeat at that time.  In clinic today, patient found to be in atrial flutter with RVR.  He was relatively symptom-free, denied chest pain, pressure, PND, orthopnea, lower extremity edema.  Given rapid rate, decision made to send the patient to the emergency department for admission for rhythm/rate management.  Of note, patient reports that he has missed several doses of Eliquis in the past month.  He did take Eliquis this morning around 9 AM.   Past Medical History:  Diagnosis Date   AF (paroxysmal atrial fibrillation) (HCC) 11/29/2018   Atrial flutter (HCC) 10/12/2015   CAD (coronary artery disease), native coronary artery 12/06/2015   a. 10/2015 MV:  EF  37%, reversible defect inferior apex, intermediate risk findings; b. 10/2015 Cath: 20% mid RCA.   Colonic diverticular abscess    Diverticulitis    History of chemotherapy 2005   Cisplatin   Hypothyroidism    NICM (nonischemic cardiomyopathy) (HCC) 10/14/2015   a. Tachy mediated?;  b. Echo 3/17 - Mild concentric LVH, EF 30-35%, anteroseptal, anterior, anterolateral, apical anterior, lateral hypokinesis, trivial MR, mild to moderately reduced RVSF; c. LHC 3/17 - mRCA 20%   Paroxysmal atrial flutter (HCC)    a. TEE 3/17 with ? LAA clot-->s/p TEE/DCCV 11/24/2015   Radiation NOv.3,2005-Dec. 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  XRT    Past Surgical History:  Procedure Laterality Date   APPENDECTOMY N/A 03/07/2019   Procedure: ROBOTIC ASSISTED APPENDECTOMY;  Surgeon: Karie Soda, MD;  Location: [facility] ORS;  Service: General;  Laterality: N/A;   CARDIAC CATHETERIZATION N/A 10/18/2015   Procedure: Left Heart Cath and Coronary Angiography;  Surgeon: Kathleene Hazel, MD;  Location: [facility] INVASIVE CV LAB;  Service: Cardiovascular;  Laterality: N/A;   CARDIOVERSION N/A 11/24/2015   Procedure: CARDIOVERSION;  Surgeon: Wendall Stade, MD;  Location: [facility] ENDOSCOPY;  Service: Cardiovascular;  Laterality: N/A;   CYSTOSCOPY WITH STENT PLACEMENT Bilateral 03/07/2019   Procedure: CYSTOSCOPY WITH BILATERAL FIREFLY INJECTION;  Surgeon: Crist Fat, MD;  Location: [facility] ORS;  Service: Urology;  Laterality: Bilateral;   GASTROSTOMY TUBE  PLACEMENT  07/04/2004   IR - G tube for tonsilar cancer   IR ANGIO INTRA EXTRACRAN SEL COM CAROTID INNOMINATE UNI R MOD SED  03/09/2019   IR ANGIO VERTEBRAL SEL VERTEBRAL UNI R MOD SED  03/09/2019   IR CT HEAD LTD  03/09/2019   IR PERCUTANEOUS ART THROMBECTOMY/INFUSION INTRACRANIAL INC DIAG ANGIO  03/09/2019   IR RADIOLOGIST EVAL & MGMT  12/18/2018   LAMINECTOMY     C5/placement of steel plate   NECK SURGERY  2003   replaced disk    RADIOLOGY WITH ANESTHESIA N/A 03/08/2019   Procedure: IR WITH ANESTHESIA;  Surgeon: Julieanne Cotton, MD;  Location: [facility] OR;  Service: Radiology;  Laterality: N/A;   TEE WITHOUT CARDIOVERSION N/A 10/15/2015   Procedure: TRANSESOPHAGEAL ECHOCARDIOGRAM (TEE);  Surgeon: Laurey Morale, MD;  Location: [facility] ENDOSCOPY;  Service: Cardiovascular;  Laterality: N/A;   TEE WITHOUT CARDIOVERSION N/A 11/24/2015   Procedure: TRANSESOPHAGEAL ECHOCARDIOGRAM (TEE);  Surgeon: Wendall Stade, MD;  Location: [facility] ENDOSCOPY;  Service: Cardiovascular;  Laterality: N/A;   XI ROBOTIC ASSISTED COLOSTOMY TAKEDOWN N/A 03/07/2019   Procedure: XI ROBOTIC ASSISTED LOW ANTERIOR RESECTION, RIGID PROCTOSCOPY;  Surgeon: Karie Soda, MD;  Location: [facility] ORS;  Service: General;  Laterality: N/A;     Medications Prior to Admission: Prior to Admission medications   Medication Sig Start Date End Date Taking? Authorizing Provider  apixaban (ELIQUIS) 5 MG TABS tablet TAKE 1 TABLET BY MOUTH TWICE A DAY 10/16/22   Burchette, Elberta Fortis, MD  atorvastatin (LIPITOR) 40 MG tablet Take 1 tablet (40 mg total) by mouth daily. 12/15/22   Burchette, Elberta Fortis, MD  Ibuprofen 200 MG CAPS Take 400 mg by mouth daily as needed (Pain). PRN OTC Patient not taking: Reported on 12/19/2022    [provider]  levothyroxine (SYNTHROID) 125 MCG tablet Take 1 tablet (125 mcg total) by mouth daily. 12/15/22   Burchette, Elberta Fortis, MD  losartan (COZAAR) 50 MG tablet Take 50 mg by mouth daily.    [provider]     Allergies:   No Known Allergies  Social History:   Social History   Socioeconomic History   Marital status: Married    Spouse name: Not on file   Number of children: Not on file   Years of education: Not on file   Highest education level: Not on file  Occupational History   Not on file  Tobacco Use   Smoking status: Every Day    Packs/day: 0.50    Years: 20.00    Additional pack years: 0.00    Total pack years: 10.00    Types:  Cigarettes   Smokeless tobacco: Never   Tobacco comments:    1/2 pack per day  Vaping Use   Vaping Use: Never used  Substance and Sexual Activity   Alcohol use: Yes    Alcohol/week: 12.0 standard drinks of alcohol    Types: 12 Cans of beer per week    Comment: couple of beers each day   Drug use: No   Sexual activity: Not on file  Other Topics Concern   Not on file  Social History Narrative   Not on file   Social Determinants of Health   Financial Resource Strain: Not on file  Food Insecurity: Not on file  Transportation Needs: Not on file  Physical Activity: Not on file  Stress: Not on file  Social Connections: Not on file  Intimate Partner Violence: Not on file    Family  History:   The patient's family history includes Cancer in his brother; Colon cancer in his father; Prostate cancer in his father; Skin cancer in his mother. There is no history of Esophageal cancer, Rectal cancer, or Stomach cancer.    ROS:  Please see the history of present illness.  All other ROS reviewed and negative.     Physical Exam/Data:   Vitals:   12/19/22 1037 12/19/22 1038  BP: (!) 147/116   Pulse: (!) 132   Resp: (!) 22   Temp: 97.6 F (36.4 C)   TempSrc: Oral   SpO2: 99%   Weight:  62.7 kg  Height:  5\' 8"  (1.727 m)   No intake or output data in the 24 hours ending 12/19/22 1108    12/19/2022   10:38 AM 12/19/2022    9:35 AM 09/15/2022   10:30 AM  Last 3 Weights  Weight (lbs) 138 lb 3.7 oz 138 lb 3.2 oz 142 lb 12.8 oz  Weight (kg) 62.7 kg 62.687 kg 64.774 kg     Body mass index is 21.02 kg/m.  General:  Well nourished, well developed, in no acute distress HEENT: normal Neck: no JVD Vascular: No carotid bruits; Distal pulses 2+ bilaterally   Cardiac:  normal S1, S2; regular but rapid rhythm. No murmur. Lungs:  clear to auscultation bilaterally, no wheezing, rhonchi or rales  Abd: soft, nontender, no hepatomegaly  Ext: no edema Musculoskeletal:  No deformities, BUE and BLE  strength normal and equal Skin: warm and dry  Neuro:  CNs 2-12 intact, no focal abnormalities noted Psych:  Normal affect    EKG:  The ECG that was captured this morning was personally reviewed and demonstrates atrial flutter with 2:1 conduction.  Relevant CV Studies:  03/09/2019 TTE  [name]     1. The left ventricle has normal systolic function with an ejection  fraction of 60-65%. The cavity size was normal. Left ventricular diastolic  parameters were normal.   2. The right ventricle has normal systolic function. The cavity was  normal. There is no increase in right ventricular wall thickness.   3. No evidence of mitral valve stenosis.   4. The tricuspid valve is grossly normal.   5. No stenosis of the aortic valve.   6. The aorta is normal in size and structure.   7. The aortic root and ascending aorta are normal in size and structure.   8. The interatrial septum was not assessed.   FINDINGS   Left Ventricle: The left ventricle has normal systolic function, with an  ejection fraction of 60-65%. The cavity size was normal. There is no  increase in left ventricular wall thickness. Left ventricular diastolic  parameters were normal.   Right Ventricle: The right ventricle has normal systolic function. The  cavity was normal. There is no increase in right ventricular wall  thickness.   Left Atrium: Left atrial size was normal in size.   Right Atrium: Right atrial size was normal in size. Right atrial pressure  is estimated at 10 mmHg.   Interatrial Septum: The interatrial septum was not assessed.   Pericardium: There is no evidence of pericardial effusion.   Mitral Valve: The mitral valve is normal in structure. Mitral valve  regurgitation is trivial by color flow Doppler. No evidence of mitral  valve stenosis.   Tricuspid Valve: The tricuspid valve is grossly normal. Tricuspid valve  regurgitation was not visualized by color flow Doppler.   Aortic Valve: The  aortic valve is  normal in structure. Aortic valve  regurgitation was not visualized by color flow Doppler. There is No  stenosis of the aortic valve.   Pulmonic Valve: The pulmonic valve was grossly normal. Pulmonic valve  regurgitation is not visualized by color flow Doppler.   Aorta: The aortic root and ascending aorta are normal in size and  structure. The aorta is normal in size and structure.   Laboratory Data:  High Sensitivity Troponin:  No results for input(s): "TROPONINIHS" in the last 720 hours.    ChemistryNo results for input(s): "NA", "K", "CL", "CO2", "GLUCOSE", "BUN", "CREATININE", "CALCIUM", "MG", "GFRNONAA", "GFRAA", "ANIONGAP" in the last 168 hours.  No results for input(s): "PROT", "ALBUMIN", "AST", "ALT", "ALKPHOS", "BILITOT" in the last 168 hours. Lipids No results for input(s): "CHOL", "TRIG", "HDL", "LABVLDL", "LDLCALC", "CHOLHDL" in the last 168 hours. HematologyNo results for input(s): "WBC", "RBC", "HGB", "HCT", "MCV", "MCH", "MCHC", "RDW", "PLT" in the last 168 hours. Thyroid No results for input(s): "TSH", "FREET4" in the last 168 hours. BNPNo results for input(s): "BNP", "PROBNP" in the last 168 hours.  DDimer No results for input(s): "DDIMER" in the last 168 hours.   Radiology/Studies:  No results found.   Assessment and Plan:   Recurrent atrial flutter with rapid ventricular response CHA2DS2-VASc Score = 6   Patient with history of paroxysmal atrial flutter, underwent TEE in March 2017 which found left atrial appendage thrombus.  Patient with repeat TEE the following month with resolution of thrombus and successful cardioversion.  Returned to cardiology clinic today and found to be in atrial flutter with rapid ventricular response.  Given RVR with symptoms on exertion, will admit patient to cardiac telemetry floor for rate/rhythm management.  Given that patient consumed breakfast this morning, will need to defer procedures to tomorrow. TEE/DCCV  scheduled for tomorrow Start metoprolol 25 mg twice daily per Dr. Mayford Knife. May increase to TID today if BP allows. Continue Eliquis 5 mg twice daily  Shared Decision Making/Informed Consent The risks [stroke, cardiac arrhythmias rarely resulting in the need for a temporary or permanent pacemaker, skin irritation or burns, esophageal damage, perforation (1:10,000 risk), bleeding, pharyngeal hematoma as well as other potential complications associated with conscious sedation including aspiration, arrhythmia, respiratory failure and death], benefits (treatment guidance, restoration of normal sinus rhythm, diagnostic support) and alternatives of a transesophageal echocardiogram guided cardioversion were discussed in detail with Mr. Diffenderfer and he is willing to proceed.   Hypertension  Patient on home Losartan 50mg . This may need to be reduced during admission to allow up-titration of rate control.   Hx Cardiomyopathy Non obstructive CAD Hyperlipidemia  Patient previously found with reduced LVEF 30-35% in 2017 (in the setting of atrial flutter with RVR). LHC at that time with mild non-obstructive CAD and cardiomyopathy suspected to be tachy-mediated. Repeat echocardiogram in 2020 showed normal LVEF 60-65%.   No anginal symptoms at this time. No ischemic changes on ECG.  Continue statin therapy (LDL 60 as of 02/10/22)  Lower lobe inspiratory wheezing Tobacco use  Patient with inspiratory wheezing in bilateral lower lobes, R>L.   Will follow CTA chest as ordered by ED provider. Smoking cessation reviewed with patient.   Code status: Patient's code status reviewed on admission. He requests full scope of care.  Risk Assessment/Risk Scores:         CHA2DS2-VASc Score = 6   This indicates a 9.7% annual risk of stroke. The patient's score is based upon: CHF History: 1 HTN History: 1 Diabetes History: 0 Stroke History: 2  Vascular Disease History: 1 Age Score: 1 Gender Score: 0       Severity of Illness: The appropriate patient status for this patient is OBSERVATION. Observation status is judged to be reasonable and necessary in order to provide the required intensity of service to ensure the patient's safety. The patient's presenting symptoms, physical exam findings, and initial radiographic and laboratory data in the context of their medical condition is felt to place them at decreased risk for further clinical deterioration. Furthermore, it is anticipated that the patient will be medically stable for discharge from the hospital within 2 midnights of admission.    For questions or updates, please contact [facility] Please consult [email or web address] for contact info under     Signed, Perlie Gold, PA-C  12/19/2022 11:08 AM

## 2022-12-20 ENCOUNTER — Encounter (HOSPITAL_COMMUNITY)
Admission: EM | Disposition: A | Discharge status: Home / Self Care | Payer: Self-pay | Source: Home / Self Care | Attending: Emergency Medicine

## 2022-12-20 ENCOUNTER — Encounter (HOSPITAL_COMMUNITY): Payer: Self-pay | Admitting: Cardiology

## 2022-12-20 ENCOUNTER — Observation Stay (HOSPITAL_COMMUNITY): Payer: BC Managed Care – PPO | Admitting: Anesthesiology

## 2022-12-20 ENCOUNTER — Observation Stay (HOSPITAL_BASED_OUTPATIENT_CLINIC_OR_DEPARTMENT_OTHER): Payer: BC Managed Care – PPO

## 2022-12-20 DIAGNOSIS — I2510 Atherosclerotic heart disease of native coronary artery without angina pectoris: Secondary | ICD-10-CM | POA: Diagnosis not present

## 2022-12-20 DIAGNOSIS — I480 Paroxysmal atrial fibrillation: Secondary | ICD-10-CM | POA: Diagnosis not present

## 2022-12-20 DIAGNOSIS — I4891 Unspecified atrial fibrillation: Secondary | ICD-10-CM

## 2022-12-20 DIAGNOSIS — Z7901 Long term (current) use of anticoagulants: Secondary | ICD-10-CM | POA: Diagnosis not present

## 2022-12-20 DIAGNOSIS — E039 Hypothyroidism, unspecified: Secondary | ICD-10-CM | POA: Diagnosis not present

## 2022-12-20 DIAGNOSIS — I4892 Unspecified atrial flutter: Secondary | ICD-10-CM | POA: Diagnosis not present

## 2022-12-20 HISTORY — PX: CARDIOVERSION: SHX1299

## 2022-12-20 HISTORY — PX: TEE WITHOUT CARDIOVERSION: SHX5443

## 2022-12-20 LAB — BASIC METABOLIC PANEL
Anion gap: 8 (ref 5–15)
BUN: 12 mg/dL (ref 8–23)
CO2: 25 mmol/L (ref 22–32)
Calcium: 8.6 mg/dL — ABNORMAL LOW (ref 8.9–10.3)
Chloride: 103 mmol/L (ref 98–111)
Creatinine, Ser: 1.02 mg/dL (ref 0.61–1.24)
GFR, Estimated: >60 mL/min (ref >60)
Glucose, Bld: 96 mg/dL (ref 70–99)
Potassium: 3.8 mmol/L (ref 3.5–5.1)
Sodium: 136 mmol/L (ref 135–145)

## 2022-12-20 LAB — ECHO TEE

## 2022-12-20 LAB — CBC
HCT: 45.3 % (ref 39.0–52.0)
Hemoglobin: 15.6 g/dL (ref 13.0–17.0)
MCH: 32.5 pg (ref 26.0–34.0)
MCHC: 34.4 g/dL (ref 30.0–36.0)
MCV: 94.4 fL (ref 80.0–100.0)
Platelets: 133 10*3/uL — ABNORMAL LOW (ref 150–400)
RBC: 4.8 MIL/uL (ref 4.22–5.81)
RDW: 13.6 % (ref 11.5–15.5)
WBC: 3.3 10*3/uL — ABNORMAL LOW (ref 4.0–10.5)
nRBC: 0 % (ref 0.0–0.2)

## 2022-12-20 SURGERY — ECHOCARDIOGRAM, TRANSESOPHAGEAL
Anesthesia: Monitor Anesthesia Care

## 2022-12-20 MED ORDER — PROPOFOL 500 MG/50ML IV EMUL
INTRAVENOUS | Status: DC | PRN
  Administered 2022-12-20: 150 ug/kg/min via INTRAVENOUS

## 2022-12-20 MED ORDER — LIDOCAINE 2% (20 MG/ML) 5 ML SYRINGE
INTRAMUSCULAR | Status: DC | PRN
  Administered 2022-12-20: 20 mg via INTRAVENOUS

## 2022-12-20 MED ORDER — EPHEDRINE SULFATE (PRESSORS) 50 MG/ML IJ SOLN
INTRAMUSCULAR | Status: DC | PRN
  Administered 2022-12-20 (×3): 5 mg via INTRAVENOUS

## 2022-12-20 MED ORDER — LEVOTHYROXINE SODIUM 25 MCG PO TABS: 125.0000 ug | ORAL_TABLET | Freq: Every day | ORAL | Status: DC

## 2022-12-20 MED ORDER — PHENYLEPHRINE HCL (PRESSORS) 10 MG/ML IV SOLN
INTRAVENOUS | Status: DC | PRN
  Administered 2022-12-20 (×3): 80 ug via INTRAVENOUS

## 2022-12-20 MED ORDER — PROPOFOL 10 MG/ML IV BOLUS
INTRAVENOUS | Status: DC | PRN
  Administered 2022-12-20: 20 mg via INTRAVENOUS

## 2022-12-20 MED ORDER — METOPROLOL TARTRATE 25 MG PO TABS: 25.0000 mg | ORAL_TABLET | Freq: Two times a day (BID) | ORAL | 3 refills | Status: DC

## 2022-12-20 MED ORDER — PROPOFOL 10 MG/ML IV BOLUS: INTRAVENOUS | Status: DC | PRN

## 2022-12-20 SURGICAL SUPPLY — 1 items: ELECT DEFIB PAD ADLT CADENCE (PAD) ×1 IMPLANT

## 2022-12-20 NOTE — Discharge Summary (Addendum)
Discharge Summary    Patient ID: [name] MRN: [id number]; DOB: [birth year]  Admit date: 12/19/2022 Discharge date: 12/20/2022  PCP:  Kristian Covey, MD   [facility] Providers Cardiologist:  Armanda Magic, MD        Discharge Diagnoses    Active Problems:   Atrial flutter [facility])  Diagnostic Studies/Procedures    TEE-CV 1. EF challenging with elevated rates. For more sensitive LVEF, recommend  TTE in sinus rhythm. Left ventricular ejection fraction, by estimation, is  40 to 45%. The left ventricle has mildly decreased function.   2. Right ventricular systolic function is mildly reduced. The right  ventricular size is normal.   3. No left atrial/left atrial appendage thrombus was detected. The LAA  emptying velocity was 30 cm/s.   4. The mitral valve is normal in structure. No evidence of mitral valve  regurgitation.   5. The aortic valve is normal in structure. Aortic valve regurgitation is  not visualized. No aortic stenosis is present.  _____________   History of Present Illness     Adrian [name] is a 67 y.o. male with atrial flutter, nonischemic cardiomyopathy, nonobstructive CAD admitted 12/19/22 with atrial fibrillation with RVR.  Seen by Dr. Mayford Knife in outpatient cardiology clinic 12/19/22. Patient reports that he has felt unusually fatigued over the past several weeks. He also has more acutely experienced shortness of breath with exertion in the last few days such as when mowing his yard. Patient says that he had to take multiple breaks which is not normal for him. Patient reports some sensation of dizziness when mowing his yard a couple of days ago but denies syncope. He did feel a sensation of rapid heartbeat at that time. In clinic today, patient found to be in atrial flutter with RVR. He was relatively symptom-free, denied chest pain, pressure, PND, orthopnea, lower extremity edema. Given rapid rate, decision made to send the patient to the emergency  department for admission for rhythm/rate management   Hospital Course     Consultants: none   He was admitted and started on metoprolol. He underwent successful TEE-CV today. Post cardioversion, he felt well. Breathing much improved. No evidence of pneumonia on chest x ray or CT; cannot exclude bronchitis. Blood pressure was borderline low despite holding losartan. However, he ambulated without issue and felt well. He is euvolemic on exam and able to lie flat without issue. He was requesting discharge.    Recommend evaluating heart rate as an outpatient; if tolerating metoprolol, would consolidate to succinate. If blood pressure allows, would consider restarting low dose ARB.  Did the patient have an acute coronary syndrome (MI, NSTEMI, STEMI, etc) this admission?:  No                               Did the patient have a percutaneous coronary intervention (stent / angioplasty)?:  No.     _____________  Discharge Vitals Blood pressure 96/67, pulse 66, temperature 97.6 F (36.4 C), resp. rate 16, height 5\' 8"  (1.727 m), weight 62.6 kg, SpO2 96 %.  Filed Weights   12/19/22 1038 12/19/22 1453 12/20/22 0843  Weight: 62.7 kg 61.3 kg 62.6 kg   GEN: Well nourished, well developed in no acute distress NECK: No JVD CARDIAC: regular rhythm, normal S1 and S2, no rubs or gallops. No murmur. VASCULAR: Radial pulses 2+ bilaterally.  RESPIRATORY:  Clear to auscultation without rales, wheezing or rhonchi  ABDOMEN: Soft, non-tender, non-distended MUSCULOSKELETAL:  Moves all 4 limbs independently SKIN: Warm and dry, no edema NEUROLOGIC:  No focal neuro deficits noted. PSYCHIATRIC:  Normal affect    Labs & Radiologic Studies    CBC Recent Labs    12/19/22 1152 12/20/22 0038  WBC 2.3* 3.3*  HGB 16.1 15.6  HCT 46.7 45.3  MCV 94.3 94.4  PLT 137* 133*   Basic Metabolic Panel Recent Labs    16/10/96 1152 12/20/22 0038  NA 137 136  K 4.8 3.8  CL 102 103  CO2 24 25  GLUCOSE 91 96  BUN 14  12  CREATININE 0.91 1.02  CALCIUM 9.4 8.6*   Liver Function Tests No results for input(s): "AST", "ALT", "ALKPHOS", "BILITOT", "PROT", "ALBUMIN" in the last 72 hours. No results for input(s): "LIPASE", "AMYLASE" in the last 72 hours. High Sensitivity Troponin:   No results for input(s): "TROPONINIHS" in the last 720 hours.  BNP Invalid input(s): "POCBNP" D-Dimer No results for input(s): "DDIMER" in the last 72 hours. Hemoglobin A1C No results for input(s): "HGBA1C" in the last 72 hours. Fasting Lipid Panel No results for input(s): "CHOL", "HDL", "LDLCALC", "TRIG", "CHOLHDL", "LDLDIRECT" in the last 72 hours. Thyroid Function Tests Recent Labs    12/19/22 1152  TSH 1.541   _____________  ECHO TEE  Result Date: 12/20/2022    TRANSESOPHOGEAL ECHO REPORT   Patient Name:   Adrian [name] Date of Exam: 12/20/2022 Medical Rec #:  [id number]    Height:       68.0 in Accession #:    [id number]   Weight:       138.0 lb Date of Birth:  05/03/1956    BSA:          1.746 m Patient Age:    67 years     BP:           132/98 mmHg Patient Gender: M            HR:           124 bpm. Exam Location:  Inpatient Procedure: Transesophageal Echo, Color Doppler and Cardiac Doppler Indications:     I48.91* Unspecified atrial fibrillation  History:         Patient has prior history of Echocardiogram examinations, most                  recent 03/09/2019. Arrythmias:Atrial Fibrillation; Risk                  Factors:Hypertension and Dyslipidemia.  Sonographer:     Darlys Gales Referring Phys:  [id number] Perlie Gold Diagnosing Phys: [place] PROCEDURE: After discussion of the risks and benefits of a TEE, an informed consent was obtained from the patient. The transesophogeal probe was passed without difficulty through the esophogus of the patient. Sedation performed by different physician. The patient was monitored while under deep sedation. Anesthestetic sedation was provided intravenously by Anesthesiology: 219mg  of  Propofol, 20mg  of Lidocaine. The patient developed no complications during the procedure. A successful direct current cardioversion was performed at 200 joules with 1 attempt. Transgastric views not obtained.  [name]  1. EF challenging with elevated rates. For more sensitive LVEF, recommend TTE in sinus rhythm. Left ventricular ejection fraction, by estimation, is 40 to 45%. The left ventricle has mildly decreased function.  2. Right ventricular systolic function is mildly reduced. The right ventricular size is normal.  3. No left atrial/left atrial appendage thrombus was detected. The LAA emptying  velocity was 30 cm/s.  4. The mitral valve is normal in structure. No evidence of mitral valve regurgitation.  5. The aortic valve is normal in structure. Aortic valve regurgitation is not visualized. No aortic stenosis is present. FINDINGS  Left Ventricle: EF challenging with elevated rates. For more sensitive LVEF, recommend TTE in sinus rhythm. Left ventricular ejection fraction, by estimation, is 40 to 45%. The left ventricle has mildly decreased function. The left ventricular internal cavity size was normal in size. Right Ventricle: The right ventricular size is normal. Right ventricular systolic function is mildly reduced. Left Atrium: Left atrial size was normal in size. No left atrial/left atrial appendage thrombus was detected. The LAA emptying velocity was 30 cm/s. Right Atrium: Right atrial size was normal in size. Pericardium: There is no evidence of pericardial effusion. Mitral Valve: The mitral valve is normal in structure. No evidence of mitral valve regurgitation. Tricuspid Valve: The tricuspid valve is normal in structure. Tricuspid valve regurgitation is not demonstrated. Aortic Valve: The aortic valve is normal in structure. Aortic valve regurgitation is not visualized. No aortic stenosis is present. Pulmonic Valve: Pulmonic valve regurgitation is trivial. Aorta: The aortic root is normal in size  and structure. There is minimal (Grade I) plaque. IAS/Shunts: No atrial level shunt detected by color flow Doppler. Additional Comments: Spectral Doppler performed. Carolan Clines Electronically signed by Carolan Clines Signature Date/Time: 12/20/2022/11:56:17 AM    Final    EP STUDY  Result Date: 12/20/2022 See surgical note for result.  CT Angio Chest PE W/Cm &/Or Wo Cm  Result Date: 12/19/2022 CLINICAL DATA:  Pulmonary embolism (PE) suspected, high prob EXAM: CT ANGIOGRAPHY CHEST WITH CONTRAST TECHNIQUE: Multidetector CT imaging of the chest was performed using the standard protocol during bolus administration of intravenous contrast. Multiplanar CT image reconstructions and MIPs were obtained to evaluate the vascular anatomy. RADIATION DOSE REDUCTION: This exam was performed according to the departmental dose-optimization program which includes automated exposure control, adjustment of the mA and/or kV according to patient size and/or use of iterative reconstruction technique. CONTRAST:  75mL OMNIPAQUE IOHEXOL 350 MG/ML SOLN COMPARISON:  None Available. FINDINGS: Cardiovascular: Satisfactory opacification of the pulmonary arteries to the segmental level. No evidence of pulmonary embolism. Normal heart size. No pericardial effusion. Mediastinum/Nodes: No enlarged mediastinal, hilar, or axillary lymph nodes. Thyroid gland, trachea, and esophagus demonstrate no significant findings. Lungs/Pleura: No pleural effusion. No pneumothorax. 5 mm left lower lobe solid pulmonary nodule (series 5, image 101). There is diffuse bronchial wall thickening. Upper Abdomen: There is reflux of contrast into the hepatic veins, which can be seen in the setting of right heart dysfunction. Musculoskeletal: No chest wall abnormality. No acute or significant osseous findings. Review of the MIP images confirms the above findings. IMPRESSION: 1. No evidence of pulmonary embolism. 2. Diffuse bronchial wall thickening, which can be seen in  the setting of bronchitis. 3. Reflux of contrast into the hepatic veins, which can be seen in the setting of right heart dysfunction. 4. 5 mm left lower lobe solid pulmonary nodule. Per Fleischner Society Guidelines, no routine follow-up imaging is recommended. These guidelines do not apply to immunocompromised patients and patients with cancer. Follow up in patients with significant comorbidities as clinically warranted. For lung cancer screening, adhere to Lung-RADS guidelines. Reference: Radiology. 2017; 284(1):228-43. Electronically Signed   By: Lorenza Cambridge M.D.   On: 12/19/2022 14:33   DG Chest Portable 1 View  Result Date: 12/19/2022 CLINICAL DATA:  Tachycardia. EXAM: PORTABLE CHEST 1 VIEW COMPARISON:  10/19/2015. FINDINGS: Clear lungs. Normal heart size and mediastinal contours. No pleural effusion or pneumothorax. Visualized bones and upper abdomen are unremarkable. IMPRESSION: No evidence of acute cardiopulmonary disease. Electronically Signed   By: Orvan Falconer M.D.   On: 12/19/2022 12:01   Disposition   Pt is being discharged home today in good condition.  Follow-up Plans & Appointments     Follow-up Information     Sharlene Dory, [place]. Go on 01/03/2023.   Specialty: Cardiology Why: 01/03/2023 at 10:55 AM with Jari Favre, PA at [facility] office Contact information: [address] [place] [address] [phone]                Discharge Instructions     Amb referral to AFIB Clinic   Complete by: As directed         Discharge Medications   Allergies as of 12/20/2022   No Known Allergies      Medication List     STOP taking these medications    losartan 50 MG tablet Commonly known as: COZAAR       TAKE these medications    atorvastatin 40 MG tablet Commonly known as: LIPITOR Take 1 tablet (40 mg total) by mouth daily.   Eliquis 5 MG Tabs tablet Generic drug: apixaban TAKE 1 TABLET BY MOUTH TWICE A DAY    levothyroxine 125 MCG tablet Commonly known as: SYNTHROID Take 1 tablet (125 mcg total) by mouth daily.   metoprolol tartrate 25 MG tablet Commonly known as: LOPRESSOR Take 1 tablet (25 mg total) by mouth 2 (two) times daily.           Outstanding Labs/Studies   None  Duration of Discharge Encounter   Greater than 30 minutes including physician time.  Signed, Jodelle Red, MD 12/20/2022, 4:11 PM

## 2022-12-20 NOTE — Progress Notes (Signed)
Patient ID: Adrian [name], male   DOB: Jul 11, 1956, 67 y.o.   MRN: [id number]  An After Visit Summary was printed and given to the patient.   Patient education given on medication changes, activity and follow up appointments and the patient expresses understanding and acceptance of instructions.   Patient stated he was driving home but due to sedation today MD did not clear him. Ex-wife to pick him up and drive him home.   Lidia Collum 12/20/2022 5:03 PM

## 2022-12-20 NOTE — Anesthesia Postprocedure Evaluation (Signed)
Anesthesia Post Note  Patient: Adrian [name]  Procedure(s) Performed: TRANSESOPHAGEAL ECHOCARDIOGRAM CARDIOVERSION     Patient location during evaluation: PACU Anesthesia Type: MAC Level of consciousness: awake and alert Pain management: pain level controlled Vital Signs Assessment: post-procedure vital signs reviewed and stable Respiratory status: spontaneous breathing, nonlabored ventilation, respiratory function stable and patient connected to nasal cannula oxygen Cardiovascular status: stable and blood pressure returned to baseline Postop Assessment: no apparent nausea or vomiting Anesthetic complications: no   No notable events documented.  Last Vitals:  Vitals:   12/20/22 1100 12/20/22 1200  BP: 92/73 96/67  Pulse: 71 66  Resp:  16  Temp:    SpO2: 97% 96%    Last Pain:  Vitals:   12/20/22 0852  TempSrc:   PainSc: 0-No pain                 Nelle Don Rayvon Dakin

## 2022-12-20 NOTE — CV Procedure (Signed)
Procedure: Electrical Cardioversion Indications:  Atrial Fibrillation  Procedure Details:  Consent: Risks of procedure as well as the alternatives and risks of each were explained to the (patient/caregiver).  Consent for procedure obtained.  Time Out: Verified patient identification, verified procedure, site/side was marked, verified correct patient position, special equipment/implants available, medications/allergies/relevent history reviewed, required imaging and test results available. PERFORMED.  Patient placed on cardiac monitor, pulse oximetry, supplemental oxygen as necessary.  Sedation given:  propofol Pacer pads placed anterior and posterior chest.  Cardioverted 1 time(s).  Cardioversion with synchronized biphasic 200J shock.  Evaluation: Findings: Post procedure EKG shows: NSR Complications: None Patient did tolerate procedure well.  Time Spent Directly with the Patient:  15 minutes   INDICATIONS: Atrial Fibrillation  PROCEDURE:   Informed consent was obtained prior to the procedure. The risks, benefits and alternatives for the procedure were discussed and the patient comprehended these risks.  Risks include, but are not limited to, cough, sore throat, vomiting, nausea, somnolence, esophageal and stomach trauma or perforation, bleeding, low blood pressure, aspiration, pneumonia, infection, trauma to the teeth and death.    After a procedural time-out, the oropharynx was anesthetized with 20% benzocaine spray.   During this procedure the patient was administered propofol to achieve and maintain moderate conscious sedation.  The patient's heart rate, blood pressure, and oxygen saturationweare monitored continuously during the procedure. The period of conscious sedation was 15 minutes, of which I was present face-to-face 100% of this time.  The transesophageal probe was inserted in the esophagus and stomach without difficulty and multiple views were obtained.  The patient  was kept under observation until the patient left the procedure room.  The patient left the procedure room in stable condition.   Agitated microbubble saline contrast was not administered.  COMPLICATIONS:    There were no immediate complications.  FINDINGS:  No LAA thrombus  RECOMMENDATIONS:     Continue anticoagulation  Time Spent Directly with the Patient:  15 minutes   [name] 12/20/2022, 10:13 AM   [name] 12/20/2022, 10:13 AM

## 2022-12-20 NOTE — Progress Notes (Signed)
Patient remains in [place] due to his Aflutter heart rate sustaining 120's. Patient is going for a TEE/DCCV in am and he did receive his 2nd dose of Metoprolol tonight. Will continue to monitor patient closely.

## 2022-12-20 NOTE — Anesthesia Preprocedure Evaluation (Addendum)
Anesthesia Evaluation  Patient identified by MRN, date of birth, ID band Patient awake    Reviewed: Allergy & Precautions, NPO status , Patient's Chart, lab work & pertinent test results  Airway Mallampati: II  TM Distance: >3 FB Neck ROM: Full    Dental no notable dental hx.    Pulmonary Current Smoker   Pulmonary exam normal        Cardiovascular hypertension, Pt. on medications + CAD  + dysrhythmias Atrial Fibrillation  Rhythm:Irregular Rate:Normal  ECHO 2020:  1. The left ventricle has normal systolic function with an ejection  fraction of 60-65%. The cavity size was normal. Left ventricular diastolic  parameters were normal.   2. The right ventricle has normal systolic function. The cavity was  normal. There is no increase in right ventricular wall thickness.   3. No evidence of mitral valve stenosis.   4. The tricuspid valve is grossly normal.   5. No stenosis of the aortic valve.   6. The aorta is normal in size and structure.   7. The aortic root and ascending aorta are normal in size and structure.   8. The interatrial septum was not assessed.      Neuro/Psych negative neurological ROS  negative psych ROS   GI/Hepatic negative GI ROS, Neg liver ROS,,,  Endo/Other  Hypothyroidism    Renal/GU negative Renal ROS  negative genitourinary   Musculoskeletal negative musculoskeletal ROS (+)    Abdominal Normal abdominal exam  (+)   Peds  Hematology negative hematology ROS (+)   Anesthesia Other Findings   Reproductive/Obstetrics                             Anesthesia Physical Anesthesia Plan  ASA: 3  Anesthesia Plan: MAC   Post-op Pain Management:    Induction: Intravenous  PONV Risk Score and Plan: Propofol infusion and Treatment may vary due to age or medical condition  Airway Management Planned: Simple Face Mask and Nasal Cannula  Additional Equipment:  None  Intra-op Plan:   Post-operative Plan:   Informed Consent: I have reviewed the patients History and Physical, chart, labs and discussed the procedure including the risks, benefits and alternatives for the proposed anesthesia with the patient or authorized representative who has indicated his/her understanding and acceptance.     Dental advisory given  Plan Discussed with:   Anesthesia Plan Comments:        Anesthesia Quick Evaluation

## 2022-12-20 NOTE — Plan of Care (Signed)
Patient ID: Adrian [name], male   DOB: 11-Mar-1956, 67 y.o.   MRN: [id number]  Problem: Education: Goal: Knowledge of General Education information will improve Description: Including pain rating scale, medication(s)/side effects and non-pharmacologic comfort measures Outcome: Adequate for Discharge   Problem: Health Behavior/Discharge Planning: Goal: Ability to manage health-related needs will improve Outcome: Adequate for Discharge   Problem: Clinical Measurements: Goal: Ability to maintain clinical measurements within normal limits will improve Outcome: Adequate for Discharge Goal: Will remain free from infection Outcome: Adequate for Discharge Goal: Diagnostic test results will improve Outcome: Adequate for Discharge Goal: Respiratory complications will improve Outcome: Adequate for Discharge Goal: Cardiovascular complication will be avoided Outcome: Adequate for Discharge   Problem: Activity: Goal: Risk for activity intolerance will decrease Outcome: Adequate for Discharge   Problem: Nutrition: Goal: Adequate nutrition will be maintained Outcome: Adequate for Discharge   Problem: Coping: Goal: Level of anxiety will decrease Outcome: Adequate for Discharge   Problem: Elimination: Goal: Will not experience complications related to bowel motility Outcome: Adequate for Discharge Goal: Will not experience complications related to urinary retention Outcome: Adequate for Discharge   Problem: Pain Managment: Goal: General experience of comfort will improve Outcome: Adequate for Discharge   Problem: Safety: Goal: Ability to remain free from injury will improve Outcome: Adequate for Discharge   Problem: Skin Integrity: Goal: Risk for impaired skin integrity will decrease Outcome: Adequate for Discharge  Lidia Collum, RN

## 2022-12-20 NOTE — Transfer of Care (Signed)
Immediate Anesthesia Transfer of Care Note  Patient: Adrian [name]  Procedure(s) Performed: TRANSESOPHAGEAL ECHOCARDIOGRAM CARDIOVERSION  Patient Location: Cath Lab  Anesthesia Type:General  Level of Consciousness: sedated  Airway & Oxygen Therapy: Patient connected to nasal cannula oxygen  Post-op Assessment: Post -op Vital signs reviewed and stable  Post vital signs: stable  Last Vitals:  Vitals Value Taken Time  BP    Temp    Pulse    Resp    SpO2      Last Pain:  Vitals:   12/20/22 0852  TempSrc:   PainSc: 0-No pain         Complications: No notable events documented.

## 2022-12-20 NOTE — Progress Notes (Signed)
  Patient ID: Adrian [name], male   DOB: 27-Jun-1956, 67 y.o.   MRN: [id number]  Patient [place] in ED. Arrived to floor via stretcher. Currently in [place]. Denies symptoms and doesn't appear to be in any distress. Paged PA for cardiology. Orders received.  Lidia Collum, RN

## 2022-12-20 NOTE — Interval H&P Note (Signed)
History and Physical Interval Note:  12/20/2022 9:44 AM  Adrian [name]  has presented today for surgery, with the diagnosis of AFLUTTER.  The various methods of treatment have been discussed with the patient and family. After consideration of risks, benefits and other options for treatment, the patient has consented to  Procedure(s): TRANSESOPHAGEAL ECHOCARDIOGRAM (N/A) CARDIOVERSION (N/A) as a surgical intervention.  The patient's history has been reviewed, patient examined, no change in status, stable for surgery.  I have reviewed the patient's chart and labs.  Questions were answered to the patient's satisfaction.     [name]

## 2022-12-20 NOTE — TOC Transition Note (Signed)
Transition of Care [facility]) - CM/SW Discharge Note   Patient Details  Name: Adrian [name] MRN: [id number] Date of Birth: 1956/04/27  Transition of Care [facility]) CM/SW Contact:  Kermit Balo, RN Phone Number: 12/20/2022, 4:25 PM   Clinical Narrative:    Pt is discharging home with self care. No needs per TOC.   Final next level of care: Home/Self Care Barriers to Discharge: No Barriers Identified   Patient Goals and CMS Choice      Discharge Placement                         Discharge Plan and Services Additional resources added to the After Visit Summary for                                       Social Determinants of Health (SDOH) Interventions SDOH Screenings   Food Insecurity: No Food Insecurity (12/19/2022)  Housing: Low Risk  (12/19/2022)  Transportation Needs: No Transportation Needs (12/19/2022)  Utilities: Not At Risk (12/19/2022)  Tobacco Use: High Risk (12/20/2022)     Readmission Risk Interventions     No data to display

## 2022-12-20 NOTE — Progress Notes (Signed)
  Transition of Care [facility]) Screening Note   Patient Details  Name: Adrian [name] Date of Birth: 09/11/1955   Transition of Care [facility]) CM/SW Contact:    Harriet Masson, RN Phone Number: 12/20/2022, 10:47 AM    Transition of Care Department [facility]) has reviewed patient and no TOC needs have been identified at this time. We will continue to monitor patient advancement through interdisciplinary progression rounds. If new patient transition needs arise, please place a TOC consult.

## 2022-12-21 ENCOUNTER — Telehealth: Payer: Self-pay

## 2022-12-21 ENCOUNTER — Encounter (HOSPITAL_COMMUNITY): Payer: Self-pay | Admitting: Internal Medicine

## 2022-12-21 NOTE — Transitions of Care (Post Inpatient/ED Visit) (Signed)
   12/21/2022  Name: Adrian [name] MRN: [id number] DOB: 22-Jun-1956  Today's TOC FU Call Status: Today's TOC FU Call Status:: Successful TOC FU Call Competed  Transition Care Management Follow-up Telephone Call Discharge Facility: [facility]) Type of Discharge: Inpatient Admission Primary Inpatient Discharge Diagnosis:: Afid ,Dyspnea How have you been since you were released from the hospital?: Better Any questions or concerns?: No  Items Reviewed: Did you receive and understand the discharge instructions provided?: Yes Medications obtained,verified, and reconciled?: Yes (Medications Reviewed) Any new allergies since your discharge?: No Dietary orders reviewed?: NA Do you have support at home?: Yes People in Home: child(ren), dependent  Medications Reviewed Today: Medications Reviewed Today     Reviewed by Adrian [name], CMA (Certified Medical Assistant) on 12/21/22 at [phone]  Med List Status: <None>   Medication Order Taking? Sig Documenting Provider Last Dose Status Informant  apixaban (ELIQUIS) 5 MG TABS tablet [id number] Yes TAKE 1 TABLET BY MOUTH TWICE A DAY Burchette, Adrian Fortis, MD Taking Active Self, Pharmacy Records  atorvastatin (LIPITOR) 40 MG tablet [id number] Yes Take 1 tablet (40 mg total) by mouth daily. Adrian Covey, MD Taking Active Self, Pharmacy Records  levothyroxine (SYNTHROID) 125 MCG tablet [id number] Yes Take 1 tablet (125 mcg total) by mouth daily. Adrian Covey, MD Taking Active Self, Pharmacy Records  metoprolol tartrate (LOPRESSOR) 25 MG tablet [id number] Yes Take 1 tablet (25 mg total) by mouth 2 (two) times daily. Adrian Red, MD Taking Active             Home Care and Equipment/Supplies: Were Home Health Services Ordered?: No Any new equipment or medical supplies ordered?: No  Functional Questionnaire: Do you need assistance with bathing/showering or dressing?: No Do you need assistance with meal preparation?: No Do you  need assistance with eating?: No Do you have difficulty maintaining continence: No Do you need assistance with getting out of bed/getting out of a chair/moving?: No Do you have difficulty managing or taking your medications?: No  Follow up appointments reviewed: PCP Follow-up appointment confirmed?: NA Specialist Hospital Follow-up appointment confirmed?: Yes Date of Specialist follow-up appointment?: 01/03/23 Follow-Up Specialty Provider:: conte,tessa Do you need transportation to your follow-up appointment?: No Do you understand care options if your condition(s) worsen?: Yes-patient verbalized understanding    SIGNATURE Adrian [name],CMA [facility]

## 2023-01-02 NOTE — H&P (View-Only) (Signed)
Office Visit    Patient Name: Adrian [name] Date of Encounter: 01/03/2023  PCP:  Kristian Covey, MD   [facility]  Cardiologist:  Armanda Magic, MD  Advanced Practice Provider:  No care team member to display Electrophysiologist:  None   Chief Complaint    Adrian [name] is a 67 y.o. male with a hx of atrial flutter in the setting of acute diverticulitis, nonischemic cardiomyopathy LVEF 30 to 35%, nonobstructive LAD on cardiac cath in 2017, PAF on Eliquis for CHA2DS2-VASc of 2 presents today for overdue 1 year follow-up.  He underwent a robotic resection for diverticulitis 03/07/2019.  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  He had expressive aphasiaconfident but no focal weaknesses otherwise.  He failed swallow eval.  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  Neurology felt it was safe to transition him back to oral anticoagulation with aggressive outpatient rehab.  2D echocardiogram 03/09/2019 with normal LVEF 60 to 65%.  He was seen 10/01/2020 and was doing well at that time.  He denies any chest pain or pressure, SOB, DOE, PND, orthopnea.  He also denied lower extremity edema, dizziness, palpitations, or syncope.  He is compliant with his medications.  He was seen by me 02/10/2022, he overall is doing well since his last visit.  He states that sometimes he gets short of breath when lifting heavy boxes.  He works for [employer].  However, he has not had any significant shortness of breath with activity or chest pain.  He denies palpitations, fluttering in his chest, or feeling like his heart is racing.  He has not had any lower extremity edema.  He overall feels well and is able to do what he wants to do.  Blood pressure is well controlled today.  EKG reviewed with the patient.  We will get an updated lipid panel today.  Today, he presents today for hospital follow-up.  He was recently cardioverted on 5/14 by  Dr. Cristal Deer.  His EKG today shows atrial flutter, rate 130 bpm.  He has not missed a dose of his Eliquis.  His blood pressure is low normal today.  We have slowly titrated his metoprolol however, limited by blood pressure.  Asymptomatic.  No chest pain or shortness of breath.  No fatigue, dizziness, lightheadedness, or palpitations.  Discussed avoiding heavy lifting and heavy exertion.  Discussed avoiding bearing down until DCCV is arranged.  Reports no shortness of breath nor dyspnea on exertion. Reports no chest pain, pressure, or tightness. No edema, orthopnea, PND. Reports no palpitations.   Past Medical History    Past Medical History:  Diagnosis Date   AF (paroxysmal atrial fibrillation) (HCC) 11/29/2018   Atrial flutter (HCC) 10/12/2015   CAD (coronary artery disease), native coronary artery 12/06/2015   a. 10/2015 MV: EF  37%, reversible defect inferior apex, intermediate risk findings; b. 10/2015 Cath: 20% mid RCA.   Colonic diverticular abscess    Diverticulitis    History of chemotherapy 2005   Cisplatin   Hypothyroidism    NICM (nonischemic cardiomyopathy) (HCC) 10/14/2015   a. Tachy mediated?;  b. Echo 3/17 - Mild concentric LVH, EF 30-35%, anteroseptal, anterior, anterolateral, apical anterior, lateral hypokinesis, trivial MR, mild to moderately reduced RVSF; c. LHC 3/17 - mRCA 20%   Paroxysmal atrial flutter (HCC)    a. TEE 3/17 with ? LAA clot-->s/p TEE/DCCV 11/24/2015   Radiation NOv.3,2005-Dec. 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  XRT   Past Surgical History:  Procedure Laterality Date   APPENDECTOMY N/A 03/07/2019   Procedure: ROBOTIC ASSISTED APPENDECTOMY;  Surgeon: Karie Soda, MD;  Location: [facility] ORS;  Service: General;  Laterality: N/A;   CARDIAC CATHETERIZATION N/A 10/18/2015   Procedure: Left Heart Cath and Coronary Angiography;  Surgeon: Kathleene Hazel, MD;  Location: [facility] INVASIVE CV LAB;  Service: Cardiovascular;   Laterality: N/A;   CARDIOVERSION N/A 11/24/2015   Procedure: CARDIOVERSION;  Surgeon: Wendall Stade, MD;  Location: [facility] ENDOSCOPY;  Service: Cardiovascular;  Laterality: N/A;   CARDIOVERSION N/A 12/20/2022   Procedure: CARDIOVERSION;  Surgeon: [name], MD;  Location: [facility] INVASIVE CV LAB;  Service: Cardiovascular;  Laterality: N/A;   CYSTOSCOPY WITH STENT PLACEMENT Bilateral 03/07/2019   Procedure: CYSTOSCOPY WITH BILATERAL FIREFLY INJECTION;  Surgeon: Crist Fat, MD;  Location: [facility]S;  Service: Urology;  Laterality: Bilateral;   GASTROSTOMY TUBE PLACEMENT  07/04/2004   IR - G tube for tonsilar cancer   IR ANGIO INTRA EXTRACRAN SEL COM CAROTID INNOMINATE UNI R MOD SED  03/09/2019   IR ANGIO VERTEBRAL SEL VERTEBRAL UNI R MOD SED  03/09/2019   IR CT HEAD LTD  03/09/2019   IR PERCUTANEOUS ART THROMBECTOMY/INFUSION INTRACRANIAL INC DIAG ANGIO  03/09/2019   IR RADIOLOGIST EVAL & MGMT  12/18/2018   LAMINECTOMY     C5/placement of steel plate   NECK SURGERY  2003   replaced disk   RADIOLOGY WITH ANESTHESIA N/A 03/08/2019   Procedure: IR WITH ANESTHESIA;  Surgeon: Julieanne Cotton, MD;  Location: [facility] OR;  Service: Radiology;  Laterality: N/A;   TEE WITHOUT CARDIOVERSION N/A 10/15/2015   Procedure: TRANSESOPHAGEAL ECHOCARDIOGRAM (TEE);  Surgeon: Laurey Morale, MD;  Location: [facility] ENDOSCOPY;  Service: Cardiovascular;  Laterality: N/A;   TEE WITHOUT CARDIOVERSION N/A 11/24/2015   Procedure: TRANSESOPHAGEAL ECHOCARDIOGRAM (TEE);  Surgeon: Wendall Stade, MD;  Location: [facility] ENDOSCOPY;  Service: Cardiovascular;  Laterality: N/A;   TEE WITHOUT CARDIOVERSION N/A 12/20/2022   Procedure: TRANSESOPHAGEAL ECHOCARDIOGRAM;  Surgeon: [name], MD;  Location: [facility] INVASIVE CV LAB;  Service: Cardiovascular;  Laterality: N/A;   XI ROBOTIC ASSISTED COLOSTOMY TAKEDOWN N/A 03/07/2019   Procedure: XI ROBOTIC ASSISTED LOW ANTERIOR RESECTION, RIGID PROCTOSCOPY;  Surgeon: Karie Soda, MD;  Location: [facility];  Service:  General;  Laterality: N/A;    Allergies  No Known Allergies  EKGs/Labs/Other Studies Reviewed:   The following studies were reviewed today:  Echocardiogram 03/09/2019  [name]     1. The left ventricle has normal systolic function with an ejection  fraction of 60-65%. The cavity size was normal. Left ventricular diastolic  parameters were normal.   2. The right ventricle has normal systolic function. The cavity was  normal. There is no increase in right ventricular wall thickness.   3. No evidence of mitral valve stenosis.   4. The tricuspid valve is grossly normal.   5. No stenosis of the aortic valve.   6. The aorta is normal in size and structure.   7. The aortic root and ascending aorta are normal in size and structure.   8. The interatrial septum was not assessed.    EKG:  EKG is  ordered today.  The ekg ordered today demonstrates normal sinus rhythm, rate 74 bpm  Recent Labs: 09/15/2022: ALT 19 12/19/2022: B Natriuretic Peptide 92.1; TSH 1.541 12/20/2022: BUN 12; Creatinine, Ser 1.02;  Hemoglobin 15.6; Platelets 133; Potassium 3.8; Sodium 136  Recent Lipid Panel    Component Value Date/Time   CHOL 142 02/10/2022 0910   TRIG 131 02/10/2022 0910   HDL 59 02/10/2022 0910   CHOLHDL 2.4 02/10/2022 0910   CHOLHDL 3 04/22/2021 0914   VLDL 39.4 04/22/2021 0914   LDLCALC 60 02/10/2022 0910   LDLDIRECT 143.3 06/20/2012 1148    Risk Assessment/Calculations:   CHA2DS2-VASc Score = 6   This indicates a 9.7% annual risk of stroke. The patient's score is based upon: CHF History: 1 HTN History: 1 Diabetes History: 0 Stroke History: 2 Vascular Disease History: 1 Age Score: 1 Gender Score: 0   Home Medications   Current Meds  Medication Sig   apixaban (ELIQUIS) 5 MG TABS tablet TAKE 1 TABLET BY MOUTH TWICE A DAY   atorvastatin (LIPITOR) 40 MG tablet Take 1 tablet (40 mg total) by mouth daily.   levothyroxine (SYNTHROID) 125 MCG tablet Take 1 tablet (125 mcg total)  by mouth daily.   [DISCONTINUED] metoprolol tartrate (LOPRESSOR) 25 MG tablet Take 1 tablet (25 mg total) by mouth 2 (two) times daily.     Review of Systems      All other systems reviewed and are otherwise negative except as noted above.  Physical Exam    VS:  BP 108/72   Pulse (!) 129   Ht 5\' 8"  (1.727 m)   Wt 139 lb 12.8 oz (63.4 kg)   SpO2 97%   BMI 21.26 kg/m  , BMI Body mass index is 21.26 kg/m.  Wt Readings from Last 3 Encounters:  01/03/23 139 lb 12.8 oz (63.4 kg)  12/20/22 138 lb (62.6 kg)  12/19/22 138 lb 3.2 oz (62.7 kg)     GEN: Well nourished, well developed, in no acute distress. HEENT: normal. Neck: Supple, no JVD, carotid bruits, or masses. Cardiac: tachycardia, no murmurs, rubs, or gallops. No clubbing, cyanosis, edema.  Radials/PT 2+ and equal bilaterally.  Respiratory:  Respirations regular and unlabored GI: Soft, nontender, nondistended. MS: No deformity or atrophy. Skin: Warm and dry, no rash. Neuro:  Strength and sensation are intact. Psych: Normal affect.  Assessment & Plan    PAF/Aflutter -Status post DCCV 11/2015, 5/14 this year was his second DCCV  -Anticoagulated with Eliquis 5 mg twice daily, no missed doses -No issues with palpitation, racing heartbeat, or fluttering in his chest -asymptomatic at this time -magnesium, BMP at this time -increase metoprolol to 37.5mg  BID -keep track of BP and HR at home -Will need another DCCV  Nonischemic cardiomyopathy -No new shortness of breath or lower extremity edema -Appears euvolemic on exam  CAD -No chest pain -Continue GDMT: Lipitor 40 mg daily, Eliquis 5 mg twice daily -EKG shows Aflutter, rate 130bpm  HLD -LDL goal less than 70 -We will order updated lipid panel and LFTs today -Continue Lipitor 40 mg daily  History of embolic stroke -He does have some residual effects with spelling and speech.  He does have trouble with math. -He did participate in speech therapy  6. Nicotine  Use -He smokes about a pack a day -Cessation advised.  Patient states that he will try to cut back.    Disposition: Follow up 2-3 weeks with Armanda Magic, MD or APP.  Signed, Sharlene Dory, PA-C 01/03/2023, 12:38 PM [facility]

## 2023-01-02 NOTE — Progress Notes (Unsigned)
Office Visit    Patient Name: Adrian [name] Date of Encounter: 01/03/2023  PCP:  Kristian Covey, MD   [facility]  Cardiologist:  Armanda Magic, MD  Advanced Practice Provider:  No care team member to display Electrophysiologist:  None   Chief Complaint    Adrian [name] is a 67 y.o. male with a hx of atrial flutter in the setting of acute diverticulitis, nonischemic cardiomyopathy LVEF 30 to 35%, nonobstructive LAD on cardiac cath in 2017, PAF on Eliquis for CHA2DS2-VASc of 2 presents today for overdue 1 year follow-up.  He underwent a robotic resection for diverticulitis 03/07/2019.  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  He had expressive aphasiaconfident but no focal weaknesses otherwise.  He failed swallow eval.  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  Neurology felt it was safe to transition him back to oral anticoagulation with aggressive outpatient rehab.  2D echocardiogram 03/09/2019 with normal LVEF 60 to 65%.  He was seen 10/01/2020 and was doing well at that time.  He denies any chest pain or pressure, SOB, DOE, PND, orthopnea.  He also denied lower extremity edema, dizziness, palpitations, or syncope.  He is compliant with his medications.  He was seen by me 02/10/2022, he overall is doing well since his last visit.  He states that sometimes he gets short of breath when lifting heavy boxes.  He works for [employer].  However, he has not had any significant shortness of breath with activity or chest pain.  He denies palpitations, fluttering in his chest, or feeling like his heart is racing.  He has not had any lower extremity edema.  He overall feels well and is able to do what he wants to do.  Blood pressure is well controlled today.  EKG reviewed with the patient.  We will get an updated lipid panel today.  Today, he presents today for hospital follow-up.  He was recently cardioverted on 5/14 by  Dr. Cristal Deer.  His EKG today shows atrial flutter, rate 130 bpm.  He has not missed a dose of his Eliquis.  His blood pressure is low normal today.  We have slowly titrated his metoprolol however, limited by blood pressure.  Asymptomatic.  No chest pain or shortness of breath.  No fatigue, dizziness, lightheadedness, or palpitations.  Discussed avoiding heavy lifting and heavy exertion.  Discussed avoiding bearing down until DCCV is arranged.  Reports no shortness of breath nor dyspnea on exertion. Reports no chest pain, pressure, or tightness. No edema, orthopnea, PND. Reports no palpitations.   Past Medical History    Past Medical History:  Diagnosis Date   AF (paroxysmal atrial fibrillation) (HCC) 11/29/2018   Atrial flutter (HCC) 10/12/2015   CAD (coronary artery disease), native coronary artery 12/06/2015   a. 10/2015 MV: EF  37%, reversible defect inferior apex, intermediate risk findings; b. 10/2015 Cath: 20% mid RCA.   Colonic diverticular abscess    Diverticulitis    History of chemotherapy 2005   Cisplatin   Hypothyroidism    NICM (nonischemic cardiomyopathy) (HCC) 10/14/2015   a. Tachy mediated?;  b. Echo 3/17 - Mild concentric LVH, EF 30-35%, anteroseptal, anterior, anterolateral, apical anterior, lateral hypokinesis, trivial MR, mild to moderately reduced RVSF; c. LHC 3/17 - mRCA 20%   Paroxysmal atrial flutter (HCC)    a. TEE 3/17 with ? LAA clot-->s/p TEE/DCCV 11/24/2015   Radiation NOv.3,2005-Dec. 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  XRT   Past Surgical History:  Procedure Laterality Date   APPENDECTOMY N/A 03/07/2019   Procedure: ROBOTIC ASSISTED APPENDECTOMY;  Surgeon: Karie Soda, MD;  Location: [facility];  Service: General;  Laterality: N/A;   CARDIAC CATHETERIZATION N/A 10/18/2015   Procedure: Left Heart Cath and Coronary Angiography;  Surgeon: Kathleene Hazel, MD;  Location: [facility] INVASIVE CV LAB;  Service: Cardiovascular;   Laterality: N/A;   CARDIOVERSION N/A 11/24/2015   Procedure: CARDIOVERSION;  Surgeon: Wendall Stade, MD;  Location: [facility] ENDOSCOPY;  Service: Cardiovascular;  Laterality: N/A;   CARDIOVERSION N/A 12/20/2022   Procedure: CARDIOVERSION;  Surgeon: [name], MD;  Location: [facility] INVASIVE CV LAB;  Service: Cardiovascular;  Laterality: N/A;   CYSTOSCOPY WITH STENT PLACEMENT Bilateral 03/07/2019   Procedure: CYSTOSCOPY WITH BILATERAL FIREFLY INJECTION;  Surgeon: Crist Fat, MD;  Location: [facility];  Service: Urology;  Laterality: Bilateral;   GASTROSTOMY TUBE PLACEMENT  07/04/2004   IR - G tube for tonsilar cancer   IR ANGIO INTRA EXTRACRAN SEL COM CAROTID INNOMINATE UNI R MOD SED  03/09/2019   IR ANGIO VERTEBRAL SEL VERTEBRAL UNI R MOD SED  03/09/2019   IR CT HEAD LTD  03/09/2019   IR PERCUTANEOUS ART THROMBECTOMY/INFUSION INTRACRANIAL INC DIAG ANGIO  03/09/2019   IR RADIOLOGIST EVAL & MGMT  12/18/2018   LAMINECTOMY     C5/placement of steel plate   NECK SURGERY  2003   replaced disk   RADIOLOGY WITH ANESTHESIA N/A 03/08/2019   Procedure: IR WITH ANESTHESIA;  Surgeon: Julieanne Cotton, MD;  Location: [facility] OR;  Service: Radiology;  Laterality: N/A;   TEE WITHOUT CARDIOVERSION N/A 10/15/2015   Procedure: TRANSESOPHAGEAL ECHOCARDIOGRAM (TEE);  Surgeon: Laurey Morale, MD;  Location: [facility] ENDOSCOPY;  Service: Cardiovascular;  Laterality: N/A;   TEE WITHOUT CARDIOVERSION N/A 11/24/2015   Procedure: TRANSESOPHAGEAL ECHOCARDIOGRAM (TEE);  Surgeon: Wendall Stade, MD;  Location: [facility] ENDOSCOPY;  Service: Cardiovascular;  Laterality: N/A;   TEE WITHOUT CARDIOVERSION N/A 12/20/2022   Procedure: TRANSESOPHAGEAL ECHOCARDIOGRAM;  Surgeon: [name], MD;  Location: [facility] INVASIVE CV LAB;  Service: Cardiovascular;  Laterality: N/A;   XI ROBOTIC ASSISTED COLOSTOMY TAKEDOWN N/A 03/07/2019   Procedure: XI ROBOTIC ASSISTED LOW ANTERIOR RESECTION, RIGID PROCTOSCOPY;  Surgeon: Karie Soda, MD;  Location: [facility];  Service:  General;  Laterality: N/A;    Allergies  No Known Allergies  EKGs/Labs/Other Studies Reviewed:   The following studies were reviewed today:  Echocardiogram 03/09/2019  [name]     1. The left ventricle has normal systolic function with an ejection  fraction of 60-65%. The cavity size was normal. Left ventricular diastolic  parameters were normal.   2. The right ventricle has normal systolic function. The cavity was  normal. There is no increase in right ventricular wall thickness.   3. No evidence of mitral valve stenosis.   4. The tricuspid valve is grossly normal.   5. No stenosis of the aortic valve.   6. The aorta is normal in size and structure.   7. The aortic root and ascending aorta are normal in size and structure.   8. The interatrial septum was not assessed.    EKG:  EKG is  ordered today.  The ekg ordered today demonstrates normal sinus rhythm, rate 74 bpm  Recent Labs: 09/15/2022: ALT 19 12/19/2022: B Natriuretic Peptide 92.1; TSH 1.541 12/20/2022: BUN 12; Creatinine, Ser 1.02;  Hemoglobin 15.6; Platelets 133; Potassium 3.8; Sodium 136  Recent Lipid Panel    Component Value Date/Time   CHOL 142 02/10/2022 0910   TRIG 131 02/10/2022 0910   HDL 59 02/10/2022 0910   CHOLHDL 2.4 02/10/2022 0910   CHOLHDL 3 04/22/2021 0914   VLDL 39.4 04/22/2021 0914   LDLCALC 60 02/10/2022 0910   LDLDIRECT 143.3 06/20/2012 1148    Risk Assessment/Calculations:   CHA2DS2-VASc Score = 6   This indicates a 9.7% annual risk of stroke. The patient's score is based upon: CHF History: 1 HTN History: 1 Diabetes History: 0 Stroke History: 2 Vascular Disease History: 1 Age Score: 1 Gender Score: 0   Home Medications   Current Meds  Medication Sig   apixaban (ELIQUIS) 5 MG TABS tablet TAKE 1 TABLET BY MOUTH TWICE A DAY   atorvastatin (LIPITOR) 40 MG tablet Take 1 tablet (40 mg total) by mouth daily.   levothyroxine (SYNTHROID) 125 MCG tablet Take 1 tablet (125 mcg total)  by mouth daily.   [DISCONTINUED] metoprolol tartrate (LOPRESSOR) 25 MG tablet Take 1 tablet (25 mg total) by mouth 2 (two) times daily.     Review of Systems      All other systems reviewed and are otherwise negative except as noted above.  Physical Exam    VS:  BP 108/72   Pulse (!) 129   Ht 5\' 8"  (1.727 m)   Wt 139 lb 12.8 oz (63.4 kg)   SpO2 97%   BMI 21.26 kg/m  , BMI Body mass index is 21.26 kg/m.  Wt Readings from Last 3 Encounters:  01/03/23 139 lb 12.8 oz (63.4 kg)  12/20/22 138 lb (62.6 kg)  12/19/22 138 lb 3.2 oz (62.7 kg)     GEN: Well nourished, well developed, in no acute distress. HEENT: normal. Neck: Supple, no JVD, carotid bruits, or masses. Cardiac: tachycardia, no murmurs, rubs, or gallops. No clubbing, cyanosis, edema.  Radials/PT 2+ and equal bilaterally.  Respiratory:  Respirations regular and unlabored GI: Soft, nontender, nondistended. MS: No deformity or atrophy. Skin: Warm and dry, no rash. Neuro:  Strength and sensation are intact. Psych: Normal affect.  Assessment & Plan    PAF/Aflutter -Status post DCCV 11/2015, 5/14 this year was his second DCCV  -Anticoagulated with Eliquis 5 mg twice daily, no missed doses -No issues with palpitation, racing heartbeat, or fluttering in his chest -asymptomatic at this time -magnesium, BMP at this time -increase metoprolol to 37.5mg  BID -keep track of BP and HR at home -Will need another DCCV  Nonischemic cardiomyopathy -No new shortness of breath or lower extremity edema -Appears euvolemic on exam  CAD -No chest pain -Continue GDMT: Lipitor 40 mg daily, Eliquis 5 mg twice daily -EKG shows Aflutter, rate 130bpm  HLD -LDL goal less than 70 -We will order updated lipid panel and LFTs today -Continue Lipitor 40 mg daily  History of embolic stroke -He does have some residual effects with spelling and speech.  He does have trouble with math. -He did participate in speech therapy  6. Nicotine  Use -He smokes about a pack a day -Cessation advised.  Patient states that he will try to cut back.    Disposition: Follow up 2-3 weeks with Armanda Magic, MD or APP.  Signed, Sharlene Dory, PA-C 01/03/2023, 12:38 PM [facility]

## 2023-01-03 ENCOUNTER — Ambulatory Visit: Payer: BC Managed Care – PPO | Attending: Physician Assistant | Admitting: Physician Assistant

## 2023-01-03 ENCOUNTER — Encounter: Payer: Self-pay | Admitting: Physician Assistant

## 2023-01-03 ENCOUNTER — Encounter: Payer: Self-pay | Admitting: *Deleted

## 2023-01-03 VITALS — BP 108/72 | HR 129 | Ht 68.0 in | Wt 139.8 lb

## 2023-01-03 DIAGNOSIS — I428 Other cardiomyopathies: Secondary | ICD-10-CM | POA: Diagnosis not present

## 2023-01-03 DIAGNOSIS — I2510 Atherosclerotic heart disease of native coronary artery without angina pectoris: Secondary | ICD-10-CM | POA: Diagnosis not present

## 2023-01-03 DIAGNOSIS — I4892 Unspecified atrial flutter: Secondary | ICD-10-CM

## 2023-01-03 DIAGNOSIS — I63419 Cerebral infarction due to embolism of unspecified middle cerebral artery: Secondary | ICD-10-CM

## 2023-01-03 DIAGNOSIS — Z79899 Other long term (current) drug therapy: Secondary | ICD-10-CM

## 2023-01-03 DIAGNOSIS — I480 Paroxysmal atrial fibrillation: Secondary | ICD-10-CM | POA: Diagnosis not present

## 2023-01-03 DIAGNOSIS — E785 Hyperlipidemia, unspecified: Secondary | ICD-10-CM | POA: Diagnosis not present

## 2023-01-03 DIAGNOSIS — Z720 Tobacco use: Secondary | ICD-10-CM

## 2023-01-03 MED ORDER — METOPROLOL TARTRATE 25 MG PO TABS: 37.5000 mg | ORAL_TABLET | Freq: Two times a day (BID) | ORAL | 2 refills | Status: DC

## 2023-01-03 NOTE — Patient Instructions (Addendum)
Medication Instructions:    START TAKING METOPROLOL:  37.5 MG TWICE A DAY   *If you need a refill on your cardiac medications before your next appointment, please call your pharmacy*   Lab Work: BMET MAG  AND CBC TODAY    If you have labs (blood work) drawn today and your tests are completely normal, you will receive your results only by: MyChart Message (if you have MyChart) OR A paper copy in the mail If you have any lab test that is abnormal or we need to change your treatment, we will call you to review the results.   Testing/Procedures:  ON 01-09-23 Your physician has recommended that you have a Cardioversion (DCCV). Electrical Cardioversion uses a jolt of electricity to your heart either through paddles or wired patches attached to your chest. This is a controlled, usually prescheduled, procedure. Defibrillation is done under light anesthesia in the hospital, and you usually go home the day of the procedure. This is done to get your heart back into a normal rhythm. You are not awake for the procedure. Please see the instruction sheet given to you today.   Follow-Up: At [facility], you and your health needs are our priority.  As part of our continuing mission to provide you with exceptional heart care, we have created designated Provider Care Teams.  These Care Teams include your primary Cardiologist (physician) and Advanced Practice Providers (APPs -  Physician Assistants and Nurse Practitioners) who all work together to provide you with the care you need, when you need it.  We recommend signing up for the patient portal called "MyChart".  Sign up information is provided on this After Visit Summary.  MyChart is used to connect with patients for Virtual Visits (Telemedicine).  Patients are able to view lab/test results, encounter notes, upcoming appointments, etc.  Non-urgent messages can be sent to your provider as well.   To learn more about what you can do with MyChart, go to  [email or web address].    Your next appointment:  YOU HAVE BEEN  TO EP DEPARTMENT  WITH DR Lalla Brothers FOR A FLUTTER AFTER 01-09-23   AFTER 01-09-23  2 -3  week(s) POST CARDIOVERSION   Provider:  CONTE   Other Instructions  NO HEAVY EXERTION  OR BEARING DOWN  NO HEAVY LIFTING  Heart-Healthy Eating Plan Eating a healthy diet is important for the health of your heart. A heart-healthy eating plan includes: Eating less unhealthy fats. Eating more healthy fats. Eating less salt in your food. Salt is also called sodium. Making other changes in your diet. Talk with your doctor or a diet specialist (dietitian) to create an eating plan that is right for you. What is my plan? Your doctor may recommend an eating plan that includes: Total fat: ______% or less of total calories a day. Saturated fat: ______% or less of total calories a day. Cholesterol: less than _________mg a day. Sodium: less than _________mg a day. What are tips for following this plan? Cooking Avoid frying your food. Try to bake, boil, grill, or broil it instead. You can also reduce fat by: Removing the skin from poultry. Removing all visible fats from meats. Steaming vegetables in water or broth. Meal planning  At meals, divide your plate into four equal parts: Fill one-half of your plate with vegetables and green salads. Fill one-fourth of your plate with whole grains. Fill one-fourth of your plate with lean protein foods. Eat 2-4 cups of vegetables per day. One cup of vegetables  is: 1 cup (91 g) broccoli or cauliflower florets. 2 medium carrots. 1 large bell pepper. 1 large sweet potato. 1 large tomato. 1 medium white potato. 2 cups (150 g) raw leafy greens. Eat 1-2 cups of fruit per day. One cup of fruit is: 1 small apple 1 large banana 1 cup (237 g) mixed fruit, 1 large orange,  cup (82 g) dried fruit, 1 cup (240 mL) 100% fruit juice. Eat more foods that have soluble fiber. These are apples, broccoli,  carrots, beans, peas, and barley. Try to get 20-30 g of fiber per day. Eat 4-5 servings of nuts, legumes, and seeds per week: 1 serving of dried beans or legumes equals  cup (90 g) cooked. 1 serving of nuts is  oz (12 almonds, 24 pistachios, or 7 walnut halves). 1 serving of seeds equals  oz (8 g). General information Eat more home-cooked food. Eat less restaurant, buffet, and fast food. Limit or avoid alcohol. Limit foods that are high in starch and sugar. Avoid fried foods. Lose weight if you are overweight. Keep track of how much salt (sodium) you eat. This is important if you have high blood pressure. Ask your doctor to tell you more about this. Try to add vegetarian meals each week. Fats Choose healthy fats. These include olive oil and canola oil, flaxseeds, walnuts, almonds, and seeds. Eat more omega-3 fats. These include salmon, mackerel, sardines, tuna, flaxseed oil, and ground flaxseeds. Try to eat fish at least 2 times each week. Check food labels. Avoid foods with trans fats or high amounts of saturated fat. Limit saturated fats. These are often found in animal products, such as meats, butter, and cream. These are also found in plant foods, such as palm oil, palm kernel oil, and coconut oil. Avoid foods with partially hydrogenated oils in them. These have trans fats. Examples are stick margarine, some tub margarines, cookies, crackers, and other baked goods. What foods should I eat? Fruits All fresh, canned (in natural juice), or frozen fruits. Vegetables Fresh or frozen vegetables (raw, steamed, roasted, or grilled). Green salads. Grains Most grains. Choose whole wheat and whole grains most of the time. Rice and pasta, including brown rice and pastas made with whole wheat. Meats and other proteins Lean, well-trimmed beef, veal, pork, and lamb. Chicken and [place] without skin. All fish and shellfish. Wild duck, rabbit, pheasant, and venison. Egg whites or low-cholesterol  egg substitutes. Dried beans, peas, lentils, and tofu. Seeds and most nuts. Dairy Low-fat or nonfat cheeses, including ricotta and mozzarella. Skim or 1% milk that is liquid, powdered, or evaporated. Buttermilk that is made with low-fat milk. Nonfat or low-fat yogurt. Fats and oils Non-hydrogenated (trans-free) margarines. Vegetable oils, including soybean, sesame, sunflower, olive, peanut, safflower, corn, canola, and cottonseed. Salad dressings or mayonnaise made with a vegetable oil. Beverages Mineral water. Coffee and tea. Diet carbonated beverages. Sweets and desserts Sherbet, gelatin, and fruit ice. Small amounts of dark chocolate. Limit all sweets and desserts. Seasonings and condiments All seasonings and condiments. The items listed above may not be a complete list of foods and drinks you can eat. Contact a dietitian for more options. What foods should I avoid? Fruits Canned fruit in heavy syrup. Fruit in cream or butter sauce. Fried fruit. Limit coconut. Vegetables Vegetables cooked in cheese, cream, or butter sauce. Fried vegetables. Grains Breads that are made with saturated or trans fats, oils, or whole milk. Croissants. Sweet rolls. Donuts. High-fat crackers, such as cheese crackers. Meats and other proteins Fatty  meats, such as hot dogs, ribs, sausage, bacon, rib-eye roast or steak. High-fat deli meats, such as salami and bologna. Caviar. Domestic duck and goose. Organ meats, such as liver. Dairy Cream, sour cream, cream cheese, and creamed cottage cheese. Whole-milk cheeses. Whole or 2% milk that is liquid, evaporated, or condensed. Whole buttermilk. Cream sauce or high-fat cheese sauce. Yogurt that is made from whole milk. Fats and oils Meat fat, or shortening. Cocoa butter, hydrogenated oils, palm oil, coconut oil, palm kernel oil. Solid fats and shortenings, including bacon fat, salt pork, lard, and butter. Nondairy cream substitutes. Salad dressings with cheese or sour  cream. Beverages Regular sodas and juice drinks with added sugar. Sweets and desserts Frosting. Pudding. Cookies. Cakes. Pies. Milk chocolate or white chocolate. Buttered syrups. Full-fat ice cream or ice cream drinks. The items listed above may not be a complete list of foods and drinks to avoid. Contact a dietitian for more information. Summary Heart-healthy meal planning includes eating less unhealthy fats, eating more healthy fats, and making other changes in your diet. Eat a balanced diet. This includes fruits and vegetables, low-fat or nonfat dairy, lean protein, nuts and legumes, whole grains, and heart-healthy oils and fats. This information is not intended to replace advice given to you by your health care provider. Make sure you discuss any questions you have with your health care provider. Document Revised: 08/29/2021 Document Reviewed: 08/29/2021 Elsevier Patient Education  2024 [employer] Inc[employer]  Low-Sodium Eating Plan Salt (sodium) helps you keep a healthy balance of fluids in your body. Too much sodium can raise your blood pressure. It can also cause fluid and waste to be held in your body. Your health care provider or dietitian may recommend a low-sodium eating plan if you have high blood pressure (hypertension), kidney disease, liver disease, or heart failure. Eating less sodium can help lower your blood pressure and reduce swelling. It can also protect your heart, liver, and kidneys. What are tips for following this plan? Reading food labels  Check food labels for the amount of sodium per serving. If you eat more than one serving, you must multiply the listed amount by the number of servings. Choose foods with less than 140 milligrams (mg) of sodium per serving. Avoid foods with 300 mg of sodium or more per serving. Always check how much sodium is in a product, even if the label says "unsalted" or "no salt added." Shopping  Buy products labeled as "low-sodium" or "no salt  added." Buy fresh foods. Avoid canned foods and pre-made or frozen meals. Avoid canned, cured, or processed meats. Buy breads that have less than 80 mg of sodium per slice. Cooking  Eat more home-cooked food. Try to eat less restaurant, buffet, and fast food. Try not to add salt when you cook. Use salt-free seasonings or herbs instead of table salt or sea salt. Check with your provider or pharmacist before using salt substitutes. Cook with plant-based oils, such as canola, sunflower, or olive oil. Meal planning When eating at a restaurant, ask if your food can be made with less salt or no salt. Avoid dishes labeled as brined, pickled, cured, or smoked. Avoid dishes made with soy sauce, miso, or teriyaki sauce. Avoid foods that have monosodium glutamate (MSG) in them. MSG may be added to some restaurant food, sauces, soups, bouillon, and canned foods. Make meals that can be grilled, baked, poached, roasted, or steamed. These are often made with less sodium. General information Try to limit your sodium intake to  1,500-2,300 mg each day, or the amount told by your provider. What foods should I eat? Fruits Fresh, frozen, or canned fruit. Fruit juice. Vegetables Fresh or frozen vegetables. "No salt added" canned vegetables. "No salt added" tomato sauce and paste. Low-sodium or reduced-sodium tomato and vegetable juice. Grains Low-sodium cereals, such as oats, puffed wheat and rice, and shredded wheat. Low-sodium crackers. Unsalted rice. Unsalted pasta. Low-sodium bread. Whole grain breads and whole grain pasta. Meats and other proteins Fresh or frozen meat, poultry, seafood, and fish. These should have no added salt. Low-sodium canned tuna and salmon. Unsalted nuts. Dried peas, beans, and lentils without added salt. Unsalted canned beans. Eggs. Unsalted nut butters. Dairy Milk. Soy milk. Cheese that is naturally low in sodium, such as ricotta cheese, fresh mozzarella, or Swiss cheese. Low-sodium  or reduced-sodium cheese. Cream cheese. Yogurt. Seasonings and condiments Fresh and dried herbs and spices. Salt-free seasonings. Low-sodium mustard and ketchup. Sodium-free salad dressing. Sodium-free light mayonnaise. Fresh or refrigerated horseradish. Lemon juice. Vinegar. Other foods Homemade, reduced-sodium, or low-sodium soups. Unsalted popcorn and pretzels. Low-salt or salt-free chips. The items listed above may not be all the foods and drinks you can have. Talk to a dietitian to learn more. What foods should I avoid? Vegetables Sauerkraut, pickled vegetables, and relishes. Olives. [place] fries. Onion rings. Regular canned vegetables, except low-sodium or reduced-sodium items. Regular canned tomato sauce and paste. Regular tomato and vegetable juice. Frozen vegetables in sauces. Grains Instant hot cereals. Bread stuffing, pancake, and biscuit mixes. Croutons. Seasoned rice or pasta mixes. Noodle soup cups. Boxed or frozen macaroni and cheese. Regular salted crackers. Self-rising flour. Meats and other proteins Meat or fish that is salted, canned, smoked, spiced, or pickled. Precooked or cured meat, such as sausages or meat loaves. Tomasa Blase. Ham. Pepperoni. Hot dogs. Corned beef. Chipped beef. Salt pork. Jerky. Pickled herring, anchovies, and sardines. Regular canned tuna. Salted nuts. Dairy Processed cheese and cheese spreads. Hard cheeses. Cheese curds. Blue cheese. Feta cheese. String cheese. Regular cottage cheese. Buttermilk. Canned milk. Fats and oils Salted butter. Regular margarine. Ghee. Bacon fat. Seasonings and condiments Onion salt, garlic salt, seasoned salt, table salt, and sea salt. Canned and packaged gravies. Worcestershire sauce. Tartar sauce. Barbecue sauce. Teriyaki sauce. Soy sauce, including reduced-sodium soy sauce. Steak sauce. Fish sauce. Oyster sauce. Cocktail sauce. Horseradish that you find on the shelf. Regular ketchup and mustard. Meat flavorings and tenderizers.  Bouillon cubes. Hot sauce. Pre-made or packaged marinades. Pre-made or packaged taco seasonings. Relishes. Regular salad dressings. Salsa. Other foods Salted popcorn and pretzels. Corn chips and puffs. Potato and tortilla chips. Canned or dried soups. Pizza. Frozen entrees and pot pies. The items listed above may not be all the foods and drinks you should avoid. Talk to a dietitian to learn more. This information is not intended to replace advice given to you by your health care provider. Make sure you discuss any questions you have with your health care provider. Document Revised: 08/10/2022 Document Reviewed: 08/10/2022 Elsevier Patient Education  2024 [employer].

## 2023-01-04 LAB — BASIC METABOLIC PANEL
BUN/Creatinine Ratio: 11 (ref 10–24)
BUN: 9 mg/dL (ref 8–27)
CO2: 25 mmol/L (ref 20–29)
Calcium: 9.4 mg/dL (ref 8.6–10.2)
Chloride: 103 mmol/L (ref 96–106)
Creatinine, Ser: 0.83 mg/dL (ref 0.76–1.27)
Glucose: 76 mg/dL (ref 70–99)
Potassium: 4.7 mmol/L (ref 3.5–5.2)
Sodium: 139 mmol/L (ref 134–144)
eGFR: 96 mL/min/{1.73_m2} (ref >59)

## 2023-01-04 LAB — MAGNESIUM: Magnesium: 2 mg/dL (ref 1.6–2.3)

## 2023-01-05 ENCOUNTER — Telehealth: Payer: Self-pay | Admitting: Cardiology

## 2023-01-05 MED ORDER — AMIODARONE HCL 200 MG PO TABS: ORAL_TABLET | ORAL | 0 refills | Status: DC

## 2023-01-05 NOTE — Telephone Encounter (Signed)
Pt called in stating he is unable to make his cardioversion on 01/09/23 and he needs it r/s. Please advise.

## 2023-01-05 NOTE — Telephone Encounter (Signed)
Call to patient who states he cannot under any circumstances keep his 01/09/23 appt for cardioversion. He states he is "feeling fine" and was able to mow his lawn today. He denies any SOB or chest pain/palpitations. Call to cath lab, spoke to [facility], case moved to 10:30 AM on 01/19/23 with 9:30 AM arrival time. CBC, BMP, EKG and H&P have already been completed. Letter w/ TEE/cardioversion instructions sent to patient.  On review of chart, noted that T. Asa Lente had tried to contact him about starting amiodarone, but patient  had not answered. Advised patient that T. Asa Lente had wanted to start him on an antiarrhythmic, patient agrees to plan. Order sent to pharmacy of choice.

## 2023-01-05 NOTE — Addendum Note (Signed)
Addended by: Luellen Pucker on: 01/05/2023 03:29 PM   Modules accepted: Orders

## 2023-01-05 NOTE — Telephone Encounter (Signed)
Called patient to make sure he really can't make cardioversion on Tuesday 01/09/23 as he was back in aflutter on visit w/ T. Conte on 01/03/23. No answer, left detailed message on VM per DPR. Called home number listed in demographics and spouse picked up. Spouse is not listed on DPR form so I asked her to have him call me back and gave direct number.

## 2023-01-18 ENCOUNTER — Encounter (HOSPITAL_COMMUNITY): Payer: Self-pay | Admitting: Anesthesiology

## 2023-01-18 NOTE — Progress Notes (Signed)
Left a voicemail and instructed pt to come at 0830, to remain NPO after 0000. Instructed pt to take eliquis in am with a small sip of water and to have a ride home as well as a responsible adult to stay with them for 24 hours after the procedure.

## 2023-01-19 ENCOUNTER — Ambulatory Visit (HOSPITAL_COMMUNITY)
Admission: RE | Admit: 2023-01-19 | Discharge: 2023-01-19 | Disposition: A | Discharge status: Home / Self Care | Payer: BC Managed Care – PPO | Attending: Internal Medicine | Admitting: Internal Medicine

## 2023-01-19 ENCOUNTER — Encounter (HOSPITAL_COMMUNITY): Payer: Self-pay | Admitting: Internal Medicine

## 2023-01-19 ENCOUNTER — Encounter (HOSPITAL_COMMUNITY)
Admission: RE | Disposition: A | Discharge status: Home / Self Care | Payer: Self-pay | Source: Home / Self Care | Attending: Internal Medicine

## 2023-01-19 ENCOUNTER — Other Ambulatory Visit: Payer: Self-pay

## 2023-01-19 DIAGNOSIS — I480 Paroxysmal atrial fibrillation: Secondary | ICD-10-CM | POA: Insufficient documentation

## 2023-01-19 DIAGNOSIS — F17210 Nicotine dependence, cigarettes, uncomplicated: Secondary | ICD-10-CM | POA: Diagnosis not present

## 2023-01-19 DIAGNOSIS — Z79899 Other long term (current) drug therapy: Secondary | ICD-10-CM | POA: Insufficient documentation

## 2023-01-19 DIAGNOSIS — Z8673 Personal history of transient ischemic attack (TIA), and cerebral infarction without residual deficits: Secondary | ICD-10-CM | POA: Insufficient documentation

## 2023-01-19 DIAGNOSIS — I482 Chronic atrial fibrillation, unspecified: Secondary | ICD-10-CM

## 2023-01-19 DIAGNOSIS — I2510 Atherosclerotic heart disease of native coronary artery without angina pectoris: Secondary | ICD-10-CM | POA: Diagnosis not present

## 2023-01-19 DIAGNOSIS — I428 Other cardiomyopathies: Secondary | ICD-10-CM | POA: Diagnosis not present

## 2023-01-19 DIAGNOSIS — E785 Hyperlipidemia, unspecified: Secondary | ICD-10-CM | POA: Insufficient documentation

## 2023-01-19 DIAGNOSIS — Z539 Procedure and treatment not carried out, unspecified reason: Secondary | ICD-10-CM | POA: Insufficient documentation

## 2023-01-19 DIAGNOSIS — Z7901 Long term (current) use of anticoagulants: Secondary | ICD-10-CM | POA: Insufficient documentation

## 2023-01-19 DIAGNOSIS — I4892 Unspecified atrial flutter: Secondary | ICD-10-CM | POA: Diagnosis not present

## 2023-01-19 SURGERY — INVASIVE LAB ABORTED CASE

## 2023-01-19 MED ORDER — SODIUM CHLORIDE 0.9 % IV SOLN: INTRAVENOUS | Status: DC

## 2023-01-19 MED ORDER — METOPROLOL TARTRATE 25 MG PO TABS: 25.0000 mg | ORAL_TABLET | Freq: Two times a day (BID) | ORAL | 2 refills | Status: DC

## 2023-01-19 SURGICAL SUPPLY — 1 items: ELECT DEFIB PAD ADLT CADENCE (PAD) ×2 IMPLANT

## 2023-01-19 NOTE — CV Procedure (Signed)
DCCV not performed. Patient in sinus rhythm, sinus bradycardia rate 45 bpm. Decrease metoprolol to 25 mg BID and follow up in office 1 week.

## 2023-01-19 NOTE — Interval H&P Note (Signed)
History and Physical Interval Note:  01/19/2023 8:32 AM  Adrian [name]  has presented today for surgery, with the diagnosis of AFLUTTER.  The various methods of treatment have been discussed with the patient and family. After consideration of risks, benefits and other options for treatment, the patient has consented to  Procedure(s): CARDIOVERSION (N/A) as a surgical intervention.  The patient's history has been reviewed, patient examined, no change in status, stable for surgery.  I have reviewed the patient's chart and labs.  Questions were answered to the patient's satisfaction.     [name]

## 2023-01-19 NOTE — Anesthesia Preprocedure Evaluation (Signed)
Anesthesia Evaluation  Patient identified by MRN, date of birth, ID band Patient awake    Reviewed: Allergy & Precautions, NPO status , Patient's Chart, lab work & pertinent test results  Airway Mallampati: II  TM Distance: >3 FB Neck ROM: Full    Dental no notable dental hx.    Pulmonary Current Smoker   Pulmonary exam normal        Cardiovascular hypertension, Pt. on medications and Pt. on home beta blockers + CAD  + dysrhythmias Atrial Fibrillation  Rhythm:Irregular Rate:Normal  ECHO 5/24   1. EF challenging with elevated rates. For more sensitive LVEF, recommend  TTE in sinus rhythm. Left ventricular ejection fraction, by estimation, is  40 to 45%. The left ventricle has mildly decreased function.   2. Right ventricular systolic function is mildly reduced. The right  ventricular size is normal.   3. No left atrial/left atrial appendage thrombus was detected. The LAA  emptying velocity was 30 cm/s.   4. The mitral valve is normal in structure. No evidence of mitral valve  regurgitation.   5. The aortic valve is normal in structure. Aortic valve regurgitation is  not visualized. No aortic stenosis is present.     Neuro/Psych negative neurological ROS  negative psych ROS   GI/Hepatic negative GI ROS, Neg liver ROS,,,  Endo/Other  Hypothyroidism    Renal/GU negative Renal ROS  negative genitourinary   Musculoskeletal negative musculoskeletal ROS (+)    Abdominal Normal abdominal exam  (+)   Peds  Hematology negative hematology ROS (+)   Anesthesia Other Findings   Reproductive/Obstetrics                             Anesthesia Physical Anesthesia Plan  ASA: 3  Anesthesia Plan: General   Post-op Pain Management: Minimal or no pain anticipated   Induction: Intravenous  PONV Risk Score and Plan: 1 and Propofol infusion  Airway Management Planned: Natural Airway, Simple Face  Mask and Mask  Additional Equipment:   Intra-op Plan:   Post-operative Plan:   Informed Consent:      Dental advisory given  Plan Discussed with: Anesthesiologist and CRNA  Anesthesia Plan Comments:        Anesthesia Quick Evaluation

## 2023-01-22 NOTE — Progress Notes (Unsigned)
Office Visit    Patient Name: Adrian [name] Date of Encounter: 01/23/2023  PCP:  Kristian Covey, MD   [facility]  Cardiologist:  Armanda Magic, MD  Advanced Practice Provider:  No care team member to display Electrophysiologist:  None   Chief Complaint    AYLAN PARTINGTON is a 67 y.o. male with a hx of atrial flutter in the setting of acute diverticulitis, nonischemic cardiomyopathy LVEF 30 to 35%, nonobstructive LAD on cardiac cath in 2017, PAF on Eliquis for CHA2DS2-VASc of 2 presents today for overdue 1 year follow-up.  He underwent a robotic resection for diverticulitis 03/07/2019.  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  He had expressive aphasiaconfident but no focal weaknesses otherwise.  He failed swallow eval.  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  Neurology felt it was safe to transition him back to oral anticoagulation with aggressive outpatient rehab.  2D echocardiogram 03/09/2019 with normal LVEF 60 to 65%.  He was seen 10/01/2020 and was doing well at that time.  He denies any chest pain or pressure, SOB, DOE, PND, orthopnea.  He also denied lower extremity edema, dizziness, palpitations, or syncope.  He is compliant with his medications.  He was seen by me 02/10/2022, he overall is doing well since his last visit.  He states that sometimes he gets short of breath when lifting heavy boxes.  He works for [employer].  However, he has not had any significant shortness of breath with activity or chest pain.  He denies palpitations, fluttering in his chest, or feeling like his heart is racing.  He has not had any lower extremity edema.  He overall feels well and is able to do what he wants to do.  Blood pressure is well controlled today.  EKG reviewed with the patient.  We will get an updated lipid panel today.  He was seen by me 01/03/2023, he presents today for hospital follow-up.  He was recently  cardioverted on 5/14 by Dr. Cristal Deer.  His EKG today shows atrial flutter, rate 130 bpm.  He has not missed a dose of his Eliquis.  His blood pressure is low normal today.  We have slowly titrated his metoprolol however, limited by blood pressure.  Asymptomatic.  No chest pain or shortness of breath.  No fatigue, dizziness, lightheadedness, or palpitations.  Discussed avoiding heavy lifting and heavy exertion.  Discussed avoiding bearing down until DCCV is arranged.  Today, he tells me that he has not had any chest pain or shortness of breath.  No fluttering in his chest.  He worked yesterday without any issues.  He was able to exert himself (he has to do heavy lifting at his job).  He remains on amiodarone 200 mg daily and metoprolol 25 mg twice a day.  Heart rate is in normal sinus rhythm today in the high 40s.  Asymptomatic.  He has agreed to go to the lab after his appointment today and get a lipid panel and LFTs since these labs are due.  Reports no shortness of breath nor dyspnea on exertion. Reports no chest pain, pressure, or tightness. No edema, orthopnea, PND. Reports no palpitations.    Past Medical History    Past Medical History:  Diagnosis Date   AF (paroxysmal atrial fibrillation) (HCC) 11/29/2018   Atrial flutter (HCC) 10/12/2015   CAD (coronary artery disease), native coronary artery 12/06/2015   a. 10/2015 MV:  EF  37%, reversible defect inferior apex, intermediate risk findings; b. 10/2015 Cath: 20% mid RCA.   Colonic diverticular abscess    Diverticulitis    History of chemotherapy 2005   Cisplatin   Hypothyroidism    NICM (nonischemic cardiomyopathy) (HCC) 10/14/2015   a. Tachy mediated?;  b. Echo 3/17 - Mild concentric LVH, EF 30-35%, anteroseptal, anterior, anterolateral, apical anterior, lateral hypokinesis, trivial MR, mild to moderately reduced RVSF; c. LHC 3/17 - mRCA 20%   Paroxysmal atrial flutter (HCC)    a. TEE 3/17 with ? LAA clot-->s/p TEE/DCCV 11/24/2015   Radiation  NOv.3,2005-Dec. 15, 2005   6810 cGy in 30 fractions   Tonsil cancer [facility]) 2005   Dr Otila Kluver.  XRT   Past Surgical History:  Procedure Laterality Date   APPENDECTOMY N/A 03/07/2019   Procedure: ROBOTIC ASSISTED APPENDECTOMY;  Surgeon: Karie Soda, MD;  Location: [facility] ORS;  Service: General;  Laterality: N/A;   CARDIAC CATHETERIZATION N/A 10/18/2015   Procedure: Left Heart Cath and Coronary Angiography;  Surgeon: Kathleene Hazel, MD;  Location: [facility] INVASIVE CV LAB;  Service: Cardiovascular;  Laterality: N/A;   CARDIOVERSION N/A 11/24/2015   Procedure: CARDIOVERSION;  Surgeon: Wendall Stade, MD;  Location: [facility] ENDOSCOPY;  Service: Cardiovascular;  Laterality: N/A;   CARDIOVERSION N/A 12/20/2022   Procedure: CARDIOVERSION;  Surgeon: [name], MD;  Location: [facility] INVASIVE CV LAB;  Service: Cardiovascular;  Laterality: N/A;   CYSTOSCOPY WITH STENT PLACEMENT Bilateral 03/07/2019   Procedure: CYSTOSCOPY WITH BILATERAL FIREFLY INJECTION;  Surgeon: Crist Fat, MD;  Location: [facility] ORS;  Service: Urology;  Laterality: Bilateral;   GASTROSTOMY TUBE PLACEMENT  07/04/2004   IR - G tube for tonsilar cancer   IR ANGIO INTRA EXTRACRAN SEL COM CAROTID INNOMINATE UNI R MOD SED  03/09/2019   IR ANGIO VERTEBRAL SEL VERTEBRAL UNI R MOD SED  03/09/2019   IR CT HEAD LTD  03/09/2019   IR PERCUTANEOUS ART THROMBECTOMY/INFUSION INTRACRANIAL INC DIAG ANGIO  03/09/2019   IR RADIOLOGIST EVAL & MGMT  12/18/2018   LAMINECTOMY     C5/placement of steel plate   NECK SURGERY  2003   replaced disk   RADIOLOGY WITH ANESTHESIA N/A 03/08/2019   Procedure: IR WITH ANESTHESIA;  Surgeon: Julieanne Cotton, MD;  Location: [facility] OR;  Service: Radiology;  Laterality: N/A;   TEE WITHOUT CARDIOVERSION N/A 10/15/2015   Procedure: TRANSESOPHAGEAL ECHOCARDIOGRAM (TEE);  Surgeon: Laurey Morale, MD;  Location[facility] ENDOSCOPY;  Service: Cardiovascular;  Laterality: N/A;   TEE WITHOUT CARDIOVERSION N/A 11/24/2015   Procedure:  TRANSESOPHAGEAL ECHOCARDIOGRAM (TEE);  Surgeon: Wendall Stade, MD;  Location: [facility] ENDOSCOPY;  Service: Cardiovascular;  Laterality: N/A;   TEE WITHOUT CARDIOVERSION N/A 12/20/2022   Procedure: TRANSESOPHAGEAL ECHOCARDIOGRAM;  Surgeon: [name], MD;  Location: [facility] INVASIVE CV LAB;  Service: Cardiovascular;  Laterality: N/A;   XI ROBOTIC ASSISTED COLOSTOMY TAKEDOWN N/A 03/07/2019   Procedure: XI ROBOTIC ASSISTED LOW ANTERIOR RESECTION, RIGID PROCTOSCOPY;  Surgeon: Karie Soda, MD;  Location: [facility] ORS;  Service: General;  Laterality: N/A;    Allergies  No Known Allergies  EKGs/Labs/Other Studies Reviewed:   The following studies were reviewed today:  Echocardiogram 03/09/2019  [name]     1. The left ventricle has normal systolic function with an ejection  fraction of 60-65%. The cavity size was normal. Left ventricular diastolic  parameters were normal.   2. The right ventricle has normal systolic function. The cavity was  normal. There is no increase in  right ventricular wall thickness.   3. No evidence of mitral valve stenosis.   4. The tricuspid valve is grossly normal.   5. No stenosis of the aortic valve.   6. The aorta is normal in size and structure.   7. The aortic root and ascending aorta are normal in size and structure.   8. The interatrial septum was not assessed.    EKG:  EKG is  ordered today.  The ekg ordered today demonstrates normal sinus rhythm, rate 74 bpm  Recent Labs: 09/15/2022: ALT 19 12/19/2022: B Natriuretic Peptide 92.1; TSH 1.541 12/20/2022: Hemoglobin 15.6; Platelets 133 01/03/2023: BUN 9; Creatinine, Ser 0.83; Magnesium 2.0; Potassium 4.7; Sodium 139  Recent Lipid Panel    Component Value Date/Time   CHOL 142 02/10/2022 0910   TRIG 131 02/10/2022 0910   HDL 59 02/10/2022 0910   CHOLHDL 2.4 02/10/2022 0910   CHOLHDL 3 04/22/2021 0914   VLDL 39.4 04/22/2021 0914   LDLCALC 60 02/10/2022 0910   LDLDIRECT 143.3 06/20/2012 1148    Risk  Assessment/Calculations:   CHA2DS2-VASc Score = 6   This indicates a 9.7% annual risk of stroke. The patient's score is based upon: CHF History: 1 HTN History: 1 Diabetes History: 0 Stroke History: 2 Vascular Disease History: 1 Age Score: 1 Gender Score: 0   Home Medications   Current Meds  Medication Sig   amiodarone (PACERONE) 200 MG tablet Take 2 tablets (400 mg total) by mouth 2 (two) times daily for 10 days, THEN 1 tablet (200 mg total) daily.   apixaban (ELIQUIS) 5 MG TABS tablet TAKE 1 TABLET BY MOUTH TWICE A DAY   atorvastatin (LIPITOR) 40 MG tablet Take 1 tablet (40 mg total) by mouth daily.   ibuprofen (ADVIL) 200 MG tablet Take 200-400 mg by mouth daily as needed (pain.).   levothyroxine (SYNTHROID) 125 MCG tablet Take 1 tablet (125 mcg total) by mouth daily.   metoprolol tartrate (LOPRESSOR) 25 MG tablet Take 1 tablet (25 mg total) by mouth 2 (two) times daily.     Review of Systems      All other systems reviewed and are otherwise negative except as noted above.  Physical Exam    VS:  BP (!) 142/86   Pulse (!) 49   Ht 5\' 8"  (1.727 m)   Wt 135 lb 12.8 oz (61.6 kg)   SpO2 98%   BMI 20.65 kg/m  , BMI Body mass index is 20.65 kg/m.  Wt Readings from Last 3 Encounters:  01/23/23 135 lb 12.8 oz (61.6 kg)  01/03/23 139 lb 12.8 oz (63.4 kg)  12/20/22 138 lb (62.6 kg)     GEN: Well nourished, well developed, in no acute distress. HEENT: normal. Neck: Supple, no JVD, carotid bruits, or masses. Cardiac: tachycardia, no murmurs, rubs, or gallops. No clubbing, cyanosis, edema.  Radials/PT 2+ and equal bilaterally.  Respiratory:  Respirations regular and unlabored GI: Soft, nontender, nondistended. MS: No deformity or atrophy. Skin: Warm and dry, no rash. Neuro:  Strength and sensation are intact. Psych: Normal affect.  Assessment & Plan    PAF/Aflutter -NSR today -Status post DCCV 11/2015, 5/14 this year was his second DCCV  -Anticoagulated with Eliquis 5  mg twice daily, no missed doses -No issues with palpitation, racing heartbeat, or fluttering in his chest -asymptomatic at this time -recent labs reviewed -continue metoprolol 25mg  BID -keep track of BP and HR at home  Nonischemic cardiomyopathy -No new shortness of breath or lower extremity edema -  Appears euvolemic on exam  CAD -No chest pain -Continue GDMT: Lipitor 40 mg daily, Eliquis 5 mg twice daily -EKG shows Aflutter, rate 130bpm  HLD -LDL goal less than 70 -We will order updated lipid panel and LFTs today -Continue Lipitor 40 mg daily  History of embolic stroke -He does have some residual effects with spelling and speech.  He does have trouble with math. -He did participate in speech therapy  6. Nicotine Use -He smokes about a pack a day -Cessation advised.  Patient states that he will try to cut back.    Disposition: Follow up 3-4 months with Armanda Magic, MD or APP.  Signed, Sharlene Dory, PA-C 01/23/2023, 10:07 AM [facility]

## 2023-01-23 ENCOUNTER — Encounter: Payer: Self-pay | Admitting: Physician Assistant

## 2023-01-23 ENCOUNTER — Ambulatory Visit: Payer: BC Managed Care – PPO | Attending: Physician Assistant | Admitting: Physician Assistant

## 2023-01-23 VITALS — BP 142/86 | HR 49 | Ht 68.0 in | Wt 135.8 lb

## 2023-01-23 DIAGNOSIS — I428 Other cardiomyopathies: Secondary | ICD-10-CM

## 2023-01-23 DIAGNOSIS — Z79899 Other long term (current) drug therapy: Secondary | ICD-10-CM

## 2023-01-23 DIAGNOSIS — Z720 Tobacco use: Secondary | ICD-10-CM

## 2023-01-23 DIAGNOSIS — I480 Paroxysmal atrial fibrillation: Secondary | ICD-10-CM

## 2023-01-23 DIAGNOSIS — E785 Hyperlipidemia, unspecified: Secondary | ICD-10-CM

## 2023-01-23 DIAGNOSIS — I63419 Cerebral infarction due to embolism of unspecified middle cerebral artery: Secondary | ICD-10-CM

## 2023-01-23 DIAGNOSIS — I2510 Atherosclerotic heart disease of native coronary artery without angina pectoris: Secondary | ICD-10-CM

## 2023-01-23 LAB — LIPID PANEL
Chol/HDL Ratio: 2.8 ratio (ref 0.0–5.0)
Cholesterol, Total: 154 mg/dL (ref 100–199)
HDL: 55 mg/dL (ref >39)
LDL Chol Calc (NIH): 85 mg/dL (ref 0–99)
Triglycerides: 72 mg/dL (ref 0–149)
VLDL Cholesterol Cal: 14 mg/dL (ref 5–40)

## 2023-01-23 LAB — HEPATIC FUNCTION PANEL
ALT: 30 IU/L (ref 0–44)
AST: 21 IU/L (ref 0–40)
Albumin: 4.1 g/dL (ref 3.9–4.9)
Alkaline Phosphatase: 94 IU/L (ref 44–121)
Bilirubin Total: 0.8 mg/dL (ref 0.0–1.2)
Bilirubin, Direct: 0.24 mg/dL (ref 0.00–0.40)
Total Protein: 7.2 g/dL (ref 6.0–8.5)

## 2023-01-23 NOTE — Patient Instructions (Signed)
Medication Instructions:  Your physician recommends that you continue on your current medications as directed. Please refer to the Current Medication list given to you today.  *If you need a refill on your cardiac medications before your next appointment, please call your pharmacy*  Lab Work: Lipids and lft's-TODAY If you have labs (blood work) drawn today and your tests are completely normal, you will receive your results only by: MyChart Message (if you have MyChart) OR A paper copy in the mail If you have any lab test that is abnormal or we need to change your treatment, we will call you to review the results.  Follow-Up: At [facility], you and your health needs are our priority.  As part of our continuing mission to provide you with exceptional heart care, we have created designated Provider Care Teams.  These Care Teams include your primary Cardiologist (physician) and Advanced Practice Providers (APPs -  Physician Assistants and Nurse Practitioners) who all work together to provide you with the care you need, when you need it.  We recommend signing up for the patient portal called "MyChart".  Sign up information is provided on this After Visit Summary.  MyChart is used to connect with patients for Virtual Visits (Telemedicine).  Patients are able to view lab/test results, encounter notes, upcoming appointments, etc.  Non-urgent messages can be sent to your provider as well.   To learn more about what you can do with MyChart, go to [email or web address].    Your next appointment:   3-4 month(s)  Provider:   Armanda Magic, MD   Low-Sodium Eating Plan Salt (sodium) helps you keep a healthy balance of fluids in your body. Too much sodium can raise your blood pressure. It can also cause fluid and waste to be held in your body. Your health care provider or dietitian may recommend a low-sodium eating plan if you have high blood pressure (hypertension), kidney disease, liver  disease, or heart failure. Eating less sodium can help lower your blood pressure and reduce swelling. It can also protect your heart, liver, and kidneys. What are tips for following this plan? Reading food labels  Check food labels for the amount of sodium per serving. If you eat more than one serving, you must multiply the listed amount by the number of servings. Choose foods with less than 140 milligrams (mg) of sodium per serving. Avoid foods with 300 mg of sodium or more per serving. Always check how much sodium is in a product, even if the label says "unsalted" or "no salt added." Shopping  Buy products labeled as "low-sodium" or "no salt added." Buy fresh foods. Avoid canned foods and pre-made or frozen meals. Avoid canned, cured, or processed meats. Buy breads that have less than 80 mg of sodium per slice. Cooking  Eat more home-cooked food. Try to eat less restaurant, buffet, and fast food. Try not to add salt when you cook. Use salt-free seasonings or herbs instead of table salt or sea salt. Check with your provider or pharmacist before using salt substitutes. Cook with plant-based oils, such as canola, sunflower, or olive oil. Meal planning When eating at a restaurant, ask if your food can be made with less salt or no salt. Avoid dishes labeled as brined, pickled, cured, or smoked. Avoid dishes made with soy sauce, miso, or teriyaki sauce. Avoid foods that have monosodium glutamate (MSG) in them. MSG may be added to some restaurant food, sauces, soups, bouillon, and canned foods. Make meals that can  be grilled, baked, poached, roasted, or steamed. These are often made with less sodium. General information Try to limit your sodium intake to 1,500-2,300 mg each day, or the amount told by your provider. What foods should I eat? Fruits Fresh, frozen, or canned fruit. Fruit juice. Vegetables Fresh or frozen vegetables. "No salt added" canned vegetables. "No salt added" tomato sauce  and paste. Low-sodium or reduced-sodium tomato and vegetable juice. Grains Low-sodium cereals, such as oats, puffed wheat and rice, and shredded wheat. Low-sodium crackers. Unsalted rice. Unsalted pasta. Low-sodium bread. Whole grain breads and whole grain pasta. Meats and other proteins Fresh or frozen meat, poultry, seafood, and fish. These should have no added salt. Low-sodium canned tuna and salmon. Unsalted nuts. Dried peas, beans, and lentils without added salt. Unsalted canned beans. Eggs. Unsalted nut butters. Dairy Milk. Soy milk. Cheese that is naturally low in sodium, such as ricotta cheese, fresh mozzarella, or Swiss cheese. Low-sodium or reduced-sodium cheese. Cream cheese. Yogurt. Seasonings and condiments Fresh and dried herbs and spices. Salt-free seasonings. Low-sodium mustard and ketchup. Sodium-free salad dressing. Sodium-free light mayonnaise. Fresh or refrigerated horseradish. Lemon juice. Vinegar. Other foods Homemade, reduced-sodium, or low-sodium soups. Unsalted popcorn and pretzels. Low-salt or salt-free chips. The items listed above may not be all the foods and drinks you can have. Talk to a dietitian to learn more. What foods should I avoid? Vegetables Sauerkraut, pickled vegetables, and relishes. Olives. [place] fries. Onion rings. Regular canned vegetables, except low-sodium or reduced-sodium items. Regular canned tomato sauce and paste. Regular tomato and vegetable juice. Frozen vegetables in sauces. Grains Instant hot cereals. Bread stuffing, pancake, and biscuit mixes. Croutons. Seasoned rice or pasta mixes. Noodle soup cups. Boxed or frozen macaroni and cheese. Regular salted crackers. Self-rising flour. Meats and other proteins Meat or fish that is salted, canned, smoked, spiced, or pickled. Precooked or cured meat, such as sausages or meat loaves. Tomasa Blase. Ham. Pepperoni. Hot dogs. Corned beef. Chipped beef. Salt pork. Jerky. Pickled herring, anchovies, and sardines.  Regular canned tuna. Salted nuts. Dairy Processed cheese and cheese spreads. Hard cheeses. Cheese curds. Blue cheese. Feta cheese. String cheese. Regular cottage cheese. Buttermilk. Canned milk. Fats and oils Salted butter. Regular margarine. Ghee. Bacon fat. Seasonings and condiments Onion salt, garlic salt, seasoned salt, table salt, and sea salt. Canned and packaged gravies. Worcestershire sauce. Tartar sauce. Barbecue sauce. Teriyaki sauce. Soy sauce, including reduced-sodium soy sauce. Steak sauce. Fish sauce. Oyster sauce. Cocktail sauce. Horseradish that you find on the shelf. Regular ketchup and mustard. Meat flavorings and tenderizers. Bouillon cubes. Hot sauce. Pre-made or packaged marinades. Pre-made or packaged taco seasonings. Relishes. Regular salad dressings. Salsa. Other foods Salted popcorn and pretzels. Corn chips and puffs. Potato and tortilla chips. Canned or dried soups. Pizza. Frozen entrees and pot pies. The items listed above may not be all the foods and drinks you should avoid. Talk to a dietitian to learn more. This information is not intended to replace advice given to you by your health care provider. Make sure you discuss any questions you have with your health care provider. Document Revised: 08/10/2022 Document Reviewed: 08/10/2022 Elsevier Patient Education  2024 Elsevier Inc.  Heart-Healthy Eating Plan Many factors influence your heart health, including eating and exercise habits. Heart health is also called coronary health. Coronary risk increases with abnormal blood fat (lipid) levels. A heart-healthy eating plan includes limiting unhealthy fats, increasing healthy fats, limiting salt (sodium) intake, and making other diet and lifestyle changes. What is my plan? Your  health care provider may recommend that: You limit your fat intake to _________% or less of your total calories each day. You limit your saturated fat intake to _________% or less of your total  calories each day. You limit the amount of cholesterol in your diet to less than _________ mg per day. You limit the amount of sodium in your diet to less than _________ mg per day. What are tips for following this plan? Cooking Cook foods using methods other than frying. Baking, boiling, grilling, and broiling are all good options. Other ways to reduce fat include: Removing the skin from poultry. Removing all visible fats from meats. Steaming vegetables in water or broth. Meal planning  At meals, imagine dividing your plate into fourths: Fill one-half of your plate with vegetables and green salads. Fill one-fourth of your plate with whole grains. Fill one-fourth of your plate with lean protein foods. Eat 2-4 cups of vegetables per day. One cup of vegetables equals 1 cup (91 g) broccoli or cauliflower florets, 2 medium carrots, 1 large bell pepper, 1 large sweet potato, 1 large tomato, 1 medium white potato, 2 cups (150 g) raw leafy greens. Eat 1-2 cups of fruit per day. One cup of fruit equals 1 small apple, 1 large banana, 1 cup (237 g) mixed fruit, 1 large orange,  cup (82 g) dried fruit, 1 cup (240 mL) 100% fruit juice. Eat more foods that contain soluble fiber. Examples include apples, broccoli, carrots, beans, peas, and barley. Aim to get 25-30 g of fiber per day. Increase your consumption of legumes, nuts, and seeds to 4-5 servings per week. One serving of dried beans or legumes equals  cup (90 g) cooked, 1 serving of nuts is  oz (12 almonds, 24 pistachios, or 7 walnut halves), and 1 serving of seeds equals  oz (8 g). Fats Choose healthy fats more often. Choose monounsaturated and polyunsaturated fats, such as olive and canola oils, avocado oil, flaxseeds, walnuts, almonds, and seeds. Eat more omega-3 fats. Choose salmon, mackerel, sardines, tuna, flaxseed oil, and ground flaxseeds. Aim to eat fish at least 2 times each week. Check food labels carefully to identify foods with  trans fats or high amounts of saturated fat. Limit saturated fats. These are found in animal products, such as meats, butter, and cream. Plant sources of saturated fats include palm oil, palm kernel oil, and coconut oil. Avoid foods with partially hydrogenated oils in them. These contain trans fats. Examples are stick margarine, some tub margarines, cookies, crackers, and other baked goods. Avoid fried foods. General information Eat more home-cooked food and less restaurant, buffet, and fast food. Limit or avoid alcohol. Limit foods that are high in added sugar and simple starches such as foods made using white refined flour (white breads, pastries, sweets). Lose weight if you are overweight. Losing just 5-10% of your body weight can help your overall health and prevent diseases such as diabetes and heart disease. Monitor your sodium intake, especially if you have high blood pressure. Talk with your health care provider about your sodium intake. Try to incorporate more vegetarian meals weekly. What foods should I eat? Fruits All fresh, canned (in natural juice), or frozen fruits. Vegetables Fresh or frozen vegetables (raw, steamed, roasted, or grilled). Green salads. Grains Most grains. Choose whole wheat and whole grains most of the time. Rice and pasta, including brown rice and pastas made with whole wheat. Meats and other proteins Lean, well-trimmed beef, veal, pork, and lamb. Chicken and [place] without  skin. All fish and shellfish. Wild duck, rabbit, pheasant, and venison. Egg whites or low-cholesterol egg substitutes. Dried beans, peas, lentils, and tofu. Seeds and most nuts. Dairy Low-fat or nonfat cheeses, including ricotta and mozzarella. Skim or 1% milk (liquid, powdered, or evaporated). Buttermilk made with low-fat milk. Nonfat or low-fat yogurt. Fats and oils Non-hydrogenated (trans-free) margarines. Vegetable oils, including soybean, sesame, sunflower, olive, avocado, peanut,  safflower, corn, canola, and cottonseed. Salad dressings or mayonnaise made with a vegetable oil. Beverages Water (mineral or sparkling). Coffee and tea. Unsweetened ice tea. Diet beverages. Sweets and desserts Sherbet, gelatin, and fruit ice. Small amounts of dark chocolate. Limit all sweets and desserts. Seasonings and condiments All seasonings and condiments. The items listed above may not be a complete list of foods and beverages you can eat. Contact a dietitian for more options. What foods should I avoid? Fruits Canned fruit in heavy syrup. Fruit in cream or butter sauce. Fried fruit. Limit coconut. Vegetables Vegetables cooked in cheese, cream, or butter sauce. Fried vegetables. Grains Breads made with saturated or trans fats, oils, or whole milk. Croissants. Sweet rolls. Donuts. High-fat crackers, such as cheese crackers and chips. Meats and other proteins Fatty meats, such as hot dogs, ribs, sausage, bacon, rib-eye roast or steak. High-fat deli meats, such as salami and bologna. Caviar. Domestic duck and goose. Organ meats, such as liver. Dairy Cream, sour cream, cream cheese, and creamed cottage cheese. Whole-milk cheeses. Whole or 2% milk (liquid, evaporated, or condensed). Whole buttermilk. Cream sauce or high-fat cheese sauce. Whole-milk yogurt. Fats and oils Meat fat, or shortening. Cocoa butter, hydrogenated oils, palm oil, coconut oil, palm kernel oil. Solid fats and shortenings, including bacon fat, salt pork, lard, and butter. Nondairy cream substitutes. Salad dressings with cheese or sour cream. Beverages Regular sodas and any drinks with added sugar. Sweets and desserts Frosting. Pudding. Cookies. Cakes. Pies. Milk chocolate or white chocolate. Buttered syrups. Full-fat ice cream or ice cream drinks. The items listed above may not be a complete list of foods and beverages to avoid. Contact a dietitian for more information. Summary Heart-healthy meal planning includes  limiting unhealthy fats, increasing healthy fats, limiting salt (sodium) intake and making other diet and lifestyle changes. Lose weight if you are overweight. Losing just 5-10% of your body weight can help your overall health and prevent diseases such as diabetes and heart disease. Focus on eating a balance of foods, including fruits and vegetables, low-fat or nonfat dairy, lean protein, nuts and legumes, whole grains, and heart-healthy oils and fats. This information is not intended to replace advice given to you by your health care provider. Make sure you discuss any questions you have with your health care provider. Document Revised: 08/29/2021 Document Reviewed: 08/29/2021 Elsevier Patient Education  2024 [employer].

## 2023-01-26 ENCOUNTER — Other Ambulatory Visit: Payer: Self-pay | Admitting: *Deleted

## 2023-01-26 DIAGNOSIS — Z79899 Other long term (current) drug therapy: Secondary | ICD-10-CM

## 2023-01-26 DIAGNOSIS — E785 Hyperlipidemia, unspecified: Secondary | ICD-10-CM

## 2023-02-02 ENCOUNTER — Ambulatory Visit: Payer: BC Managed Care – PPO | Attending: Physician Assistant

## 2023-02-02 DIAGNOSIS — Z79899 Other long term (current) drug therapy: Secondary | ICD-10-CM

## 2023-02-02 DIAGNOSIS — E785 Hyperlipidemia, unspecified: Secondary | ICD-10-CM

## 2023-02-03 LAB — LIPID PANEL
Chol/HDL Ratio: 2.4 ratio (ref 0.0–5.0)
Cholesterol, Total: 140 mg/dL (ref 100–199)
HDL: 58 mg/dL (ref >39)
LDL Chol Calc (NIH): 68 mg/dL (ref 0–99)
Triglycerides: 70 mg/dL (ref 0–149)
VLDL Cholesterol Cal: 14 mg/dL (ref 5–40)

## 2023-03-13 ENCOUNTER — Other Ambulatory Visit: Payer: Self-pay

## 2023-03-13 ENCOUNTER — Other Ambulatory Visit: Payer: Self-pay | Admitting: Family Medicine

## 2023-03-13 MED ORDER — METOPROLOL TARTRATE 25 MG PO TABS: 25.0000 mg | ORAL_TABLET | Freq: Two times a day (BID) | ORAL | 3 refills | Status: DC

## 2023-03-13 NOTE — Telephone Encounter (Signed)
Pt's medication was sent to pt's pharmacy as requested. Confirmation received.  °

## 2023-04-04 ENCOUNTER — Other Ambulatory Visit: Payer: Self-pay | Admitting: Physician Assistant

## 2023-05-07 ENCOUNTER — Telehealth: Payer: Self-pay | Admitting: Family Medicine

## 2023-05-07 MED ORDER — APIXABAN 5 MG PO TABS: 5.0000 mg | ORAL_TABLET | Freq: Two times a day (BID) | ORAL | 1 refills | Status: DC

## 2023-05-07 NOTE — Telephone Encounter (Signed)
Rx sent 

## 2023-05-07 NOTE — Telephone Encounter (Signed)
Prescription Request  05/07/2023  LOV: 09/15/2022  What is the name of the medication or equipment? apixaban (ELIQUIS) 5 MG TABS tablet   pt losing insurance and would like as many tablets as possible in the fill  Have you contacted your pharmacy to request a refill? No   Which pharmacy would you like this sent to?  [id number] [place], [place] - [address]  [address] Phone: [phone] Fax: [phone]   Patient notified that their request is being sent to the clinical staff for review and that they should receive a response within 2 business days.   Please advise at Mobile [phone] (mobile)

## 2023-05-08 NOTE — Progress Notes (Signed)
Electrophysiology Office Note:    Date:  05/11/2023   ID:  Adrian [name], DOB 04-Feb-1956, MRN [id number]  [facility] Cardiologist:  Armanda Magic, MD  [facility] Electrophysiologist:  Lanier Prude, MD   Referring MD: Sharlene Dory, PA-C   Chief Complaint: Atrial flutter  History of Present Illness:    Adrian [name] is a 67 y.o. malewho I am seeing today for an evaluation of atrial flutter at the request of Jari Favre, PA-C.  The patient was last seen by Julian Hy on January 23, 2023.  The patient has a medical history that includes nonischemic cardiomyopathy, nonobstructive coronary artery disease, atrial flutter on Eliquis, diverticulitis.  The patient has a history of stroke after undergoing robotic resection of his bowel for diverticulitis..  The patient is undergoing cardioversion in the past, as recently as Dec 19, 2022.  When he saw Julian Hy in follow-up he was back out of rhythm.  Today he is doing well.  He works at [employer] and is active.      Their past medical, social and family history was reveiwed.   ROS:   Please see the history of present illness.    All other systems reviewed and are negative.  EKGs/Labs/Other Studies Reviewed:    The following studies were reviewed today:  Dec 20, 2022 transesophageal echo EF 40 to 45% RV mildly reduced No left atrial appendage thrombus No significant valvular abnormalities  Dec 19, 2022 EKG shows atrial flutter with 2 1 AV conduction  October 15, 2015 EKG shows typical appearing atrial flutter with variable AV conduction        Physical Exam:    VS:  BP (!) 148/102   Pulse 100   Ht 5\' 8"  (1.727 m)   Wt 132 lb 6.4 oz (60.1 kg)   SpO2 99%   BMI 20.13 kg/m     Wt Readings from Last 3 Encounters:  05/11/23 132 lb 6.4 oz (60.1 kg)  01/23/23 135 lb 12.8 oz (61.6 kg)  01/03/23 139 lb 12.8 oz (63.4 kg)     GEN:  Well nourished, well developed in no acute distress CARDIAC: Regular rhythm, no murmurs, rubs,  gallops RESPIRATORY:  Clear to auscultation without rales, wheezing or rhonchi       ASSESSMENT AND PLAN:    1. PAF (paroxysmal atrial fibrillation) (HCC)   2. Chronic atrial fibrillation (HCC)   3. NICM (nonischemic cardiomyopathy) (HCC)     #Persistent atrial flutter #High risk drug monitoring-amiodarone Symptomatic and associated with reduced ejection fraction.  I do not see any evidence of atrial fibrillation on prior EKGs. He is on Eliquis for stroke prophylaxis.  He has been started on amiodarone but prefers to avoid the risks associated with long-term use.  Rhythm control is indicated.  I discussed treatment strategies including antiarrhythmic drugs and catheter ablation.  I discussed the possibility of developing atrial fibrillation after a successful atrial flutter ablation.  Discussed treatment options today for AFL including antiarrhythmic drug therapy and ablation. Discussed risks, recovery and likelihood of success with each treatment strategy. Risk, benefits, and alternatives to EP study and ablation for AFL were discussed. These risks include but are not limited to stroke, bleeding, vascular damage, tamponade, perforation, worsening renal function, coronary vasospasm and death.  Discussed potential need for repeat ablation procedures and antiarrhythmic drugs after an initial ablation. The patient understands these risk and wishes to proceed.  We will therefore proceed with catheter ablation at the next available time.  Carto,  ICE, anesthesia are requested for the procedure.    Will plan for ILR implant at the time of ablation for AF surveillance.  Plan for [employer] loop.  I discussed the loop recorder implant procedure in detail with the patient include the risks and associated monthly monitoring cost and he wishes to proceed at the time of flutter ablation.  LFTs in June okay.  TSH in May okay. Plan to stop amiodarone after ablation.  #Chronic systolic heart  failure #Nonischemic cardiomyopathy NYHA class II.  Warm and dry on exam.  Continue GDMT.        Signed, Rossie Muskrat. Lalla Brothers, MD, [facility], [facility] 05/11/2023 9:49 AM    Electrophysiology [facility]

## 2023-05-11 ENCOUNTER — Ambulatory Visit: Payer: BC Managed Care – PPO | Attending: Cardiology | Admitting: Cardiology

## 2023-05-11 ENCOUNTER — Encounter: Payer: Self-pay | Admitting: Cardiology

## 2023-05-11 VITALS — BP 148/102 | HR 100 | Ht 68.0 in | Wt 132.4 lb

## 2023-05-11 DIAGNOSIS — I428 Other cardiomyopathies: Secondary | ICD-10-CM | POA: Insufficient documentation

## 2023-05-11 DIAGNOSIS — Z79899 Other long term (current) drug therapy: Secondary | ICD-10-CM | POA: Insufficient documentation

## 2023-05-11 DIAGNOSIS — I480 Paroxysmal atrial fibrillation: Secondary | ICD-10-CM | POA: Diagnosis not present

## 2023-05-11 DIAGNOSIS — I482 Chronic atrial fibrillation, unspecified: Secondary | ICD-10-CM | POA: Diagnosis not present

## 2023-05-11 NOTE — Patient Instructions (Signed)
Medication Instructions:  Your physician recommends that you continue on your current medications as directed. Please refer to the Current Medication list given to you today.  *If you need a refill on your cardiac medications before your next appointment, please call your pharmacy*  Lab Work: BMET and CBC prior to ablation  Testing/Procedures: Your physician has recommended that you have an ablation. Catheter ablation is a medical procedure used to treat some cardiac arrhythmias (irregular heartbeats). During catheter ablation, a long, thin, flexible tube is put into a blood vessel in your groin (upper thigh), or neck. This tube is called an ablation catheter. It is then guided to your heart through the blood vessel. Radio frequency waves destroy small areas of heart tissue where abnormal heartbeats may cause an arrhythmia to start. Please see the instruction sheet given to you today.   Follow-Up: At [facility], you and your health needs are our priority.  As part of our continuing mission to provide you with exceptional heart care, we have created designated Provider Care Teams.  These Care Teams include your primary Cardiologist (physician) and Advanced Practice Providers (APPs -  Physician Assistants and Nurse Practitioners) who all work together to provide you with the care you need, when you need it.  Your next appointment:   We will call you to arrange your follow up appointments.

## 2023-06-08 ENCOUNTER — Ambulatory Visit (HOSPITAL_COMMUNITY)
Admission: EM | Admit: 2023-06-08 | Discharge: 2023-06-08 | Disposition: A | Discharge status: Home / Self Care | Payer: BC Managed Care – PPO

## 2023-06-08 ENCOUNTER — Telehealth: Payer: Self-pay | Admitting: Cardiology

## 2023-06-08 ENCOUNTER — Emergency Department (HOSPITAL_COMMUNITY): Payer: BC Managed Care – PPO

## 2023-06-08 ENCOUNTER — Encounter (HOSPITAL_COMMUNITY): Payer: Self-pay

## 2023-06-08 ENCOUNTER — Other Ambulatory Visit: Payer: Self-pay

## 2023-06-08 ENCOUNTER — Emergency Department (HOSPITAL_COMMUNITY)
Admission: EM | Admit: 2023-06-08 | Discharge: 2023-06-08 | Disposition: A | Discharge status: Home / Self Care | Payer: BC Managed Care – PPO | Attending: Emergency Medicine | Admitting: Emergency Medicine

## 2023-06-08 ENCOUNTER — Ambulatory Visit: Payer: BC Managed Care – PPO | Attending: Internal Medicine

## 2023-06-08 DIAGNOSIS — S0990XA Unspecified injury of head, initial encounter: Secondary | ICD-10-CM

## 2023-06-08 DIAGNOSIS — I480 Paroxysmal atrial fibrillation: Secondary | ICD-10-CM

## 2023-06-08 DIAGNOSIS — Z7901 Long term (current) use of anticoagulants: Secondary | ICD-10-CM | POA: Insufficient documentation

## 2023-06-08 DIAGNOSIS — S0011XA Contusion of right eyelid and periocular area, initial encounter: Secondary | ICD-10-CM | POA: Diagnosis not present

## 2023-06-08 DIAGNOSIS — I2510 Atherosclerotic heart disease of native coronary artery without angina pectoris: Secondary | ICD-10-CM | POA: Diagnosis not present

## 2023-06-08 DIAGNOSIS — Z23 Encounter for immunization: Secondary | ICD-10-CM | POA: Diagnosis not present

## 2023-06-08 DIAGNOSIS — Z79899 Other long term (current) drug therapy: Secondary | ICD-10-CM | POA: Insufficient documentation

## 2023-06-08 DIAGNOSIS — G9389 Other specified disorders of brain: Secondary | ICD-10-CM | POA: Insufficient documentation

## 2023-06-08 DIAGNOSIS — I4891 Unspecified atrial fibrillation: Secondary | ICD-10-CM | POA: Diagnosis not present

## 2023-06-08 DIAGNOSIS — H05231 Hemorrhage of right orbit: Secondary | ICD-10-CM

## 2023-06-08 DIAGNOSIS — H1131 Conjunctival hemorrhage, right eye: Secondary | ICD-10-CM | POA: Insufficient documentation

## 2023-06-08 DIAGNOSIS — I482 Chronic atrial fibrillation, unspecified: Secondary | ICD-10-CM

## 2023-06-08 DIAGNOSIS — Z8673 Personal history of transient ischemic attack (TIA), and cerebral infarction without residual deficits: Secondary | ICD-10-CM | POA: Insufficient documentation

## 2023-06-08 DIAGNOSIS — I10 Essential (primary) hypertension: Secondary | ICD-10-CM | POA: Insufficient documentation

## 2023-06-08 DIAGNOSIS — W228XXA Striking against or struck by other objects, initial encounter: Secondary | ICD-10-CM | POA: Diagnosis not present

## 2023-06-08 DIAGNOSIS — S0993XA Unspecified injury of face, initial encounter: Secondary | ICD-10-CM | POA: Diagnosis present

## 2023-06-08 DIAGNOSIS — I428 Other cardiomyopathies: Secondary | ICD-10-CM

## 2023-06-08 MED ORDER — TETANUS-DIPHTH-ACELL PERTUSSIS 5-2.5-18.5 LF-MCG/0.5 IM SUSY
0.5000 mL | PREFILLED_SYRINGE | Freq: Once | INTRAMUSCULAR | Status: AC
  Administered 2023-06-08: 0.5 mL via INTRAMUSCULAR
  Filled 2023-06-08: qty 0.5

## 2023-06-08 MED ORDER — FLUORESCEIN SODIUM 1 MG OP STRP
1.0000 | ORAL_STRIP | Freq: Once | OPHTHALMIC | Status: AC
  Administered 2023-06-08: 1 via OPHTHALMIC
  Filled 2023-06-08: qty 1

## 2023-06-08 MED ORDER — TETRACAINE HCL 0.5 % OP SOLN
1.0000 [drp] | Freq: Once | OPHTHALMIC | Status: AC
  Administered 2023-06-08: 1 [drp] via OPHTHALMIC
  Filled 2023-06-08: qty 4

## 2023-06-08 NOTE — Telephone Encounter (Signed)
Spoke with Okey Regal per DPR and she states patient fell yesterday and hit his face while taking out the trash. He has a black eye and a laceration over his head. She asked if we can advise patient to go be checked out for the fall because he is on a blood thinner.  Patient has appointment with lab today. She will ask to speak with a nurse when they arrive

## 2023-06-08 NOTE — ED Notes (Signed)
Provider made aware of pt bp of 170/120. Pt reports history of hypertension and states he will take his meds at home. Cleared by provider for discharge. Ambulatory on a steady gait. GCS 15.

## 2023-06-08 NOTE — ED Triage Notes (Signed)
Pt arrived POV from urgent care after hitting his face on his rear view mirror on Monday night. R eye has obvious bruising and swelling with dried blood. No active bleeding from injury at this time. Denies vision changes. Pt reports he is on Eliquis from having a stroke.

## 2023-06-08 NOTE — ED Triage Notes (Signed)
Pt states taking the trash out Monday night and hit his face on his car rear view mirror. Denies LOC. Pt has bruising to rt eye with swelling and dry blood. States he is on blood thinners from having a stroke.

## 2023-06-08 NOTE — ED Provider Notes (Signed)
[facility]    CSN: [id number] Arrival date & time: 06/08/23  1154      History   Chief Complaint Chief Complaint  Patient presents with   Eye Injury    HPI Adrian [name] is a 67 y.o. male.   Patient presents to clinic for right eye swelling and bruising after hitting his face on his rear car view mirror on Monday.  He was taking out the trash when he was not wearing his glasses.  He is on Eliquis. He has significant bruising around his right eye and conjunctival redness. Denies any vision changes.   He has right-sided deficits from a previous stroke.  Reports compliance with antihypertensives, reports his blood pressure normally runs in the 170s over 100s. Denies new weakness, slurred speech or headache.   He was getting his blood drawn today and evaluated by nursing staff, where he was told to seek medical care based off of his head trauma.  The history is provided by the patient and medical records.  Eye Injury    Past Medical History:  Diagnosis Date   AF (paroxysmal atrial fibrillation) (HCC) 11/29/2018   Atrial flutter (HCC) 10/12/2015   CAD (coronary artery disease), native coronary artery 12/06/2015   a. 10/2015 MV: EF  37%, reversible defect inferior apex, intermediate risk findings; b. 10/2015 Cath: 20% mid RCA.   Colonic diverticular abscess    Diverticulitis    History of chemotherapy 2005   Cisplatin   Hypothyroidism    NICM (nonischemic cardiomyopathy) (HCC) 10/14/2015   a. Tachy mediated?;  b. Echo 3/17 - Mild concentric LVH, EF 30-35%, anteroseptal, anterior, anterolateral, apical anterior, lateral hypokinesis, trivial MR, mild to moderately reduced RVSF; c. LHC 3/17 - mRCA 20%   Paroxysmal atrial flutter (HCC)    a. TEE 3/17 with ? LAA clot-->s/p TEE/DCCV 11/24/2015   Radiation NOv.3,2005-Dec. 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  XRT    Patient Active Problem List   Diagnosis Date Noted   Atrial  flutter (HCC) 12/19/2022   Essential hypertension 04/22/2021   Hypokalemia 03/12/2019   Expressive aphasia 03/10/2019   Dysphagia 03/10/2019   Middle cerebral artery embolism, left 03/09/2019   Diverticular stricture (HCC) 03/07/2019   Bacteria in urine    Chronic atrial fibrillation (HCC) 12/02/2018   Current use of long term anticoagulation 12/02/2018   UTI (urinary tract infection) 11/29/2018   Hyponatremia 11/29/2018   PAF (paroxysmal atrial fibrillation) (HCC) 11/29/2018   Diverticulitis of large intestine with abscess 09/23/2018   Dyslipidemia 12/05/2017   CAD (coronary artery disease), native coronary artery 12/06/2015   SOB (shortness of breath)    NICM (nonischemic cardiomyopathy) (HCC)    Thyroid activity decreased    Hypothyroidism 08/12/2013   History of radiation therapy 05/30/2012   Smokes tobacco daily 05/30/2012   Cancer of tonsillar fossa (HCC) 11/27/2011   Tonsil cancer (HCC) 2005   History of chemotherapy 2005    Past Surgical History:  Procedure Laterality Date   APPENDECTOMY N/A 03/07/2019   Procedure: ROBOTIC ASSISTED APPENDECTOMY;  Surgeon: Karie Soda, MD;  Location: [facility];  Service: General;  Laterality: N/A;   CARDIAC CATHETERIZATION N/A 10/18/2015   Procedure: Left Heart Cath and Coronary Angiography;  Surgeon: Kathleene Hazel, MD;  Location: [facility] INVASIVE CV LAB;  Service: Cardiovascular;  Laterality: N/A;   CARDIOVERSION N/A 11/24/2015   Procedure: CARDIOVERSION;  Surgeon: Wendall Stade, MD;  Location:  [facility]OS[facility];  Service: Cardiovascular;  Laterality: N/A;   CARDIOVERSION N/A 12/20/2022   Procedure: CARDIOVERSION;  Surgeon: [name], MD;  Location: [facility] INVASIVE CV LAB;  Service: Cardiovascular;  Laterality: N/A;   CYSTOSCOPY WITH STENT PLACEMENT Bilateral 03/07/2019   Procedure: CYSTOSCOPY WITH BILATERAL FIREFLY INJECTION;  Surgeon: Crist Fat, MD;  Location: [facility];  Service: Urology;  Laterality: Bilateral;   GASTROSTOMY TUBE  PLACEMENT  07/04/2004   IR - G tube for tonsilar cancer   IR ANGIO INTRA EXTRACRAN SEL COM CAROTID INNOMINATE UNI R MOD SED  03/09/2019   IR ANGIO VERTEBRAL SEL VERTEBRAL UNI R MOD SED  03/09/2019   IR CT HEAD LTD  03/09/2019   IR PERCUTANEOUS ART THROMBECTOMY/INFUSION INTRACRANIAL INC DIAG ANGIO  03/09/2019   IR RADIOLOGIST EVAL & MGMT  12/18/2018   LAMINECTOMY     C5/placement of steel plate   NECK SURGERY  2003   replaced disk   RADIOLOGY WITH ANESTHESIA N/A 03/08/2019   Procedure: IR WITH ANESTHESIA;  Surgeon: Julieanne Cotton, MD;  Location: [facility];  Service: Radiology;  Laterality: N/A;   TEE WITHOUT CARDIOVERSION N/A 10/15/2015   Procedure: TRANSESOPHAGEAL ECHOCARDIOGRAM (TEE);  Surgeon: Laurey Morale, MD;  Location: [facility] ENDOSCOPY;  Service: Cardiovascular;  Laterality: N/A;   TEE WITHOUT CARDIOVERSION N/A 11/24/2015   Procedure: TRANSESOPHAGEAL ECHOCARDIOGRAM (TEE);  Surgeon: Wendall Stade, MD;  Location: [facility] ENDOSCOPY;  Service: Cardiovascular;  Laterality: N/A;   TEE WITHOUT CARDIOVERSION N/A 12/20/2022   Procedure: TRANSESOPHAGEAL ECHOCARDIOGRAM;  Surgeon: [name], MD;  Location: [facility] INVASIVE CV LAB;  Service: Cardiovascular;  Laterality: N/A;   XI ROBOTIC ASSISTED COLOSTOMY TAKEDOWN N/A 03/07/2019   Procedure: XI ROBOTIC ASSISTED LOW ANTERIOR RESECTION, RIGID PROCTOSCOPY;  Surgeon: Karie Soda, MD;  Location: [facility];  Service: General;  Laterality: N/A;       Home Medications    Prior to Admission medications   Medication Sig Start Date End Date Taking? Authorizing Provider  amiodarone (PACERONE) 200 MG tablet Take 1 tablet (200 mg total) by mouth daily. 04/04/23   Sharlene Dory, PA-C  apixaban (ELIQUIS) 5 MG TABS tablet Take 1 tablet (5 mg total) by mouth 2 (two) times daily. 05/07/23   Burchette, Elberta Fortis, MD  atorvastatin (LIPITOR) 40 MG tablet TAKE 1 TABLET BY MOUTH EVERY DAY 03/14/23   Burchette, Elberta Fortis, MD  ibuprofen (ADVIL) 200 MG tablet Take 200-400 mg by mouth daily as  needed (pain.).    [provider]  levothyroxine (SYNTHROID) 112 MCG tablet Take 112 mcg by mouth daily. Patient not taking: Reported on 05/11/2023 03/14/23   [provider]  levothyroxine (SYNTHROID) 125 MCG tablet TAKE 1 TABLET BY MOUTH EVERY DAY 03/14/23   Burchette, Elberta Fortis, MD  metoprolol tartrate (LOPRESSOR) 25 MG tablet Take 1 tablet (25 mg total) by mouth 2 (two) times daily. 03/13/23   Sharlene Dory, PA-C    Family History Family History  Problem Relation Age of Onset   Prostate cancer Father    Colon cancer Father    Skin cancer Mother    Cancer Brother    Esophageal cancer Neg Hx    Rectal cancer Neg Hx    Stomach cancer Neg Hx     Social History Social History   Tobacco Use   Smoking status: Every Day    Current packs/day: 0.50    Average packs/day: 0.5 packs/day for 20.0 years (10.0 ttl pk-yrs)    Types: Cigarettes  Smokeless tobacco: Never   Tobacco comments:    1/2 pack per day  Vaping Use   Vaping status: Never Used  Substance Use Topics   Alcohol use: Yes    Alcohol/week: 12.0 standard drinks of alcohol    Types: 12 Cans of beer per week    Comment: couple of beers each day   Drug use: No     Allergies   Patient has no known allergies.   Review of Systems Review of Systems  Per HPI   Physical Exam Triage Vital Signs ED Triage Vitals  Encounter Vitals Group     BP 06/08/23 1201 (!) 174/134     Systolic BP Percentile --      Diastolic BP Percentile --      Pulse Rate 06/08/23 1201 (!) 105     Resp 06/08/23 1201 18     Temp 06/08/23 1201 97.9 F (36.6 C)     Temp Source 06/08/23 1201 Oral     SpO2 06/08/23 1201 100 %     Weight --      Height --      Head Circumference --      Peak Flow --      Pain Score 06/08/23 1204 0     Pain Loc --      Pain Education --      Exclude from Growth Chart --    No data found.  Updated Vital Signs BP (!) 174/134 (BP Location: Right Arm)   Pulse (!) 105   Temp 97.9 F (36.6 C)  (Oral)   Resp 18   SpO2 100%   Visual Acuity Right Eye Distance:   Left Eye Distance:   Bilateral Distance:    Right Eye Near:   Left Eye Near:    Bilateral Near:     Physical Exam Vitals and nursing note reviewed.  Constitutional:      Appearance: Normal appearance.  HENT:     Head: Normocephalic.     Comments: Right eye with significant bruising and swelling posttrauma.  Subconjunctival hemorrhages present in the eye. PERRLA.     Right Ear: External ear normal.     Left Ear: External ear normal.     Nose: Nose normal.     Mouth/Throat:     Mouth: Mucous membranes are moist.  Eyes:     Pupils: Pupils are equal, round, and reactive to light.  Cardiovascular:     Rate and Rhythm: Normal rate and regular rhythm.     Heart sounds: Normal heart sounds. No murmur heard. Pulmonary:     Effort: Pulmonary effort is normal. No respiratory distress.     Breath sounds: Normal breath sounds.  Neurological:     Mental Status: He is alert.  Psychiatric:        Behavior: Behavior is cooperative.      UC Treatments / Results  Labs (all labs ordered are listed, but only abnormal results are displayed) Labs Reviewed - No data to display  EKG   Radiology No results found.  Procedures Procedures (including critical care time)  Medications Ordered in UC Medications - No data to display  Initial Impression / Assessment and Plan / UC Course  I have reviewed the triage vital signs and the nursing notes.  Pertinent labs & imaging results that were available during my care of the patient were reviewed by me and considered in my medical decision making (see chart for details).  Vitals and triage reviewed, patient  is hypertensive in clinic.  Head trauma that occurred on Monday and he is on Eliquis.  No vision changes, PERRLA.  Previous deficits from stroke.  Discussed that due to head trauma, age and being on thinners that he needs to head down to [facility] emergency department  for further evaluation for potential CT scan.  Patient initially did not want to head to [facility], ED for CT scan, discussed risk of brain bleed and that presentation may be delayed.  Staff discharged down to [facility], ED, patient will head there via POV.    Final Clinical [name](s) / UC Diagnoses   Final diagnoses:  Traumatic injury of head, initial encounter  Subconjunctival hemorrhage of right eye  Long term current use of anticoagulant therapy     Discharge Instructions      Due to your head trauma and being on Eliquis, advised you to head down to the nearest emergency department for further evaluation with a can do a CT scan.  Your blood pressure is elevated today in clinic, you reported this is your baseline, however, in combination with the head trauma that is concerning.  If this is your baseline please follow-up with your primary care provider for further evaluation and management of your hypertension.  If you choose not to go down to the emergency department, please know that you could have a slow brain bleed and be at risk for coma and death.  Seek immediate care for any vision loss, blurred vision, severe headache, or any new concerning symptoms.      ED Prescriptions   None    PDMP not reviewed this encounter.   Annick Dimaio, [place] N, [place] 06/08/23 [phone]

## 2023-06-08 NOTE — Telephone Encounter (Addendum)
Patient came in for lab work but ask to see the nurse.   He fell taking out the trash yesterday and hot his face on the car.  His eye has lots of bruising and swelling. The inside was really red and he had laceration above his eye. Did advise we will not be able to see him for his eye but being that he is on eliquis he needs to have it looked at. He verbalized understanding

## 2023-06-08 NOTE — ED Provider Notes (Signed)
[place] EMERGENCY DEPARTMENT AT [facility] Provider Note   CSN: [id number] Arrival date & time: 06/08/23  1224     History  No chief complaint on file.   Adrian [name] is a 67 y.o. male.  The history is provided by the patient and medical records. No language interpreter was used.     67 year old male with significant history of prior stroke, atrial fibrillation currently on Eliquis, CAD, hypertension sent here from urgent care center with concerns of facial injury.  Patient reports 5 days ago he was [place] his trash in the [place].  He slipped on something and fell forward striking the right side of his face against his car side mirror.  He denies falling on the ground hitting his head or having a loss of consciousness.  He woke up the next day and noticing bruising around his right eyelid.  He did not think much of it as there are no significant pain no change in vision no pain with eye movement.  He normally wears glasses but did not wear his glasses when he struck his face.  He went to his doctor for a scheduled blood work done today and when the staff noticed the bruising around his eye they were concerned and encouraged patient to come to ER for further evaluation.  He went to urgent care first but was sent here for further assessment.  Patient otherwise without any complaint.  Home Medications Prior to Admission medications   Medication Sig Start Date End Date Taking? Authorizing Provider  amiodarone (PACERONE) 200 MG tablet Take 1 tablet (200 mg total) by mouth daily. 04/04/23   Sharlene Dory, PA-C  apixaban (ELIQUIS) 5 MG TABS tablet Take 1 tablet (5 mg total) by mouth 2 (two) times daily. 05/07/23   Burchette, Elberta Fortis, MD  atorvastatin (LIPITOR) 40 MG tablet TAKE 1 TABLET BY MOUTH EVERY DAY 03/14/23   Burchette, Elberta Fortis, MD  ibuprofen (ADVIL) 200 MG tablet Take 200-400 mg by mouth daily as needed (pain.).    [provider]  levothyroxine (SYNTHROID) 112 MCG  tablet Take 112 mcg by mouth daily. Patient not taking: Reported on 05/11/2023 03/14/23   [provider]  levothyroxine (SYNTHROID) 125 MCG tablet TAKE 1 TABLET BY MOUTH EVERY DAY 03/14/23   Burchette, Elberta Fortis, MD  metoprolol tartrate (LOPRESSOR) 25 MG tablet Take 1 tablet (25 mg total) by mouth 2 (two) times daily. 03/13/23   Sharlene Dory, PA-C      Allergies    Patient has no known allergies.    Review of Systems   Review of Systems  All other systems reviewed and are negative.   Physical Exam Updated Vital Signs BP (!) 162/114   Pulse (!) 102   Temp 97.8 F (36.6 C) (Oral)   Resp 16   Ht 5\' 8"  (1.727 m)   Wt 61.7 kg   SpO2 98%   BMI 20.68 kg/m  Physical Exam Vitals and nursing note reviewed.  Constitutional:      General: He is not in acute distress.    Appearance: He is well-developed.  HENT:     Head: Normocephalic.  Eyes:     General: Vision grossly intact. Gaze aligned appropriately. Scleral icterus present. No visual field deficit.       Right eye: No foreign body, discharge or hordeolum.        Left eye: No foreign body, discharge or hordeolum.     Intraocular pressure: Right  eye pressure is 10 mmHg. Measurements were taken using a handheld tonometer.    Extraocular Movements: Extraocular movements intact.     Conjunctiva/sclera:     Right eye: Right conjunctiva is injected. Chemosis and hemorrhage present. No exudate.    Left eye: Left conjunctiva is not injected. No chemosis, exudate or hemorrhage.    Pupils: Pupils are equal, round, and reactive to light. Pupils are equal.     Right eye: Pupil is round, reactive and not sluggish. No corneal abrasion or fluorescein uptake. Seidel exam negative.     Comments: Ecchymosis noted to right periorbital region with a small scab noted to right eyebrow.  There are some conjunctival hemorrhage involving the right eye.  Pupils equal and round reactive extraocular movement is intact minimal tenderness to palpation  around the orbital rim.  No significant tenderness along the bridge of the nose and no midface tenderness.  No Battle sign.  Musculoskeletal:     Cervical back: Normal range of motion and neck supple. No rigidity or tenderness.  Skin:    Findings: No rash.  Neurological:     Mental Status: He is alert.     GCS: GCS eye subscore is 4. GCS verbal subscore is 5. GCS motor subscore is 6.     ED Results / Procedures / Treatments   Labs (all labs ordered are listed, but only abnormal results are displayed) Labs Reviewed - No data to display  EKG None  Radiology CT Head Wo Contrast  Result Date: 06/08/2023 CLINICAL DATA:  Orbital trauma EXAM: CT HEAD AND ORBITS WITHOUT CONTRAST TECHNIQUE: Contiguous axial images were obtained from the base of the skull through the vertex without contrast. Multidetector CT imaging of the orbits was performed using the standard protocol without intravenous contrast. RADIATION DOSE REDUCTION: This exam was performed according to the departmental dose-optimization program which includes automated exposure control, adjustment of the mA and/or kV according to patient size and/or use of iterative reconstruction technique. COMPARISON:  09/30/2019 FINDINGS: CT HEAD FINDINGS Brain: No evidence of acute infarct, hemorrhage, mass, mass effect, or midline shift. Redemonstrated encephalomalacia in left frontal insula and operculum, with mild ex vacuo dilatation of the left frontal horn. Vascular: No hyperdense vessel. Atherosclerotic calcifications in the intracranial carotid and vertebral arteries. Skull: Negative for fracture or focal lesion. Other: The mastoids are well aerated. CT ORBITS FINDINGS Orbits: No traumatic or inflammatory finding. Unchanged appearance of the bilateral globes and lenses. Optic nerves, orbital fat, extraocular muscles, vascular structures, and lacrimal glands are normal. Visualized sinuses: Mild mucosal thickening in the right maxillary sinus. Chronic  opacification of the right frontal sinus and anterior ethmoid air cells. Soft tissues: Right periorbital hematoma. IMPRESSION: 1. No acute intracranial process. 2. Right periorbital hematoma without evidence of acute orbital injury. Electronically Signed   By: Wiliam Ke M.D.   On: 06/08/2023 17:00   CT Orbits Wo Contrast  Result Date: 06/08/2023 CLINICAL DATA:  Orbital trauma EXAM: CT HEAD AND ORBITS WITHOUT CONTRAST TECHNIQUE: Contiguous axial images were obtained from the base of the skull through the vertex without contrast. Multidetector CT imaging of the orbits was performed using the standard protocol without intravenous contrast. RADIATION DOSE REDUCTION: This exam was performed according to the departmental dose-optimization program which includes automated exposure control, adjustment of the mA and/or kV according to patient size and/or use of iterative reconstruction technique. COMPARISON:  09/30/2019 FINDINGS: CT HEAD FINDINGS Brain: No evidence of acute infarct, hemorrhage, mass, mass effect, or midline shift. Redemonstrated  encephalomalacia in left frontal insula and operculum, with mild ex vacuo dilatation of the left frontal horn. Vascular: No hyperdense vessel. Atherosclerotic calcifications in the intracranial carotid and vertebral arteries. Skull: Negative for fracture or focal lesion. Other: The mastoids are well aerated. CT ORBITS FINDINGS Orbits: No traumatic or inflammatory finding. Unchanged appearance of the bilateral globes and lenses. Optic nerves, orbital fat, extraocular muscles, vascular structures, and lacrimal glands are normal. Visualized sinuses: Mild mucosal thickening in the right maxillary sinus. Chronic opacification of the right frontal sinus and anterior ethmoid air cells. Soft tissues: Right periorbital hematoma. IMPRESSION: 1. No acute intracranial process. 2. Right periorbital hematoma without evidence of acute orbital injury. Electronically Signed   By: Wiliam Ke M.D.   On: 06/08/2023 17:00    Procedures Procedures    Medications Ordered in ED Medications  tetracaine (PONTOCAINE) 0.5 % ophthalmic solution 1 drop (1 drop Both Eyes Given 06/08/23 1355)  fluorescein ophthalmic strip 1 strip (1 strip Both Eyes Given 06/08/23 1355)  Tdap (BOOSTRIX) injection 0.5 mL (0.5 mLs Intramuscular Given 06/08/23 1300)    ED Course/ Medical Decision Making/ A&P                                 Medical Decision Making Amount and/or Complexity of Data Reviewed Radiology: ordered.  Risk Prescription drug management.   BP (!) 177/122 (BP Location: Right Arm)   Pulse (!) 101   Temp 97.8 F (36.6 C) (Oral)   Resp (!) 22   Ht 5\' 8"  (1.727 m)   Wt 61.7 kg   SpO2 100%   BMI 20.68 kg/m   74:60 PM   67 year old male with significant history of prior stroke, atrial fibrillation currently on Eliquis, CAD, hypertension sent here from urgent care center with concerns of facial injury.  Patient reports 5 days ago he was [place].  He slipped on something and fell forward striking the right side of his face against his car side mirror.  He denies falling on the ground hitting his head or having a loss of consciousness.  He woke up the next day and noticing bruising around his right eyelid.  He did not think much of it as there are no significant pain no change in vision no pain with eye movement.  He normally wears glasses but did not wear his glasses when he struck his face.  He went to his doctor for a scheduled blood work done today and when the staff noticed the bruising around his eye they were concerned and encouraged patient to come to ER for further evaluation.  He went to urgent care first but was sent here for further assessment.  Patient otherwise without any complaint.  Exam remarkable for ecchymosis noted to right periorbital region with a small scab noted to right eyebrow.  There are some conjunctival hemorrhage involving the  right eye.  Pupils equal and round reactive extraocular movement is intact minimal tenderness to palpation around the orbital rim.  No significant tenderness along the bridge of the nose and no midface tenderness.  No Battle sign.  Given his presentation, will obtain head CT scan and CT scan of the orbit to rule out retrobulbar hematoma, facial fracture, or head bleed.  Will perform visual acuity and check intraocular pressure.  Care discussed with Dr. Earlene Plater.    -Imaging independently viewed and interpreted by me and I agree  with radiologist's interpretation.  Result remarkable for CT scan of head and orbital region showing periorbital hematoma of R eye but no acute fx or acute changes -This patient presents to the ED for concern of facial injury, this involves an extensive number of treatment options, and is a complaint that carries with it a high risk of complications and morbidity.  The differential diagnosis includes orbital fx, retrobulbar hematoma, intracranial bleed, subconjunctival hemorrhage -Co morbidities that complicate the patient evaluation includes afib on eliquis, prior stroke, HTN -Treatment includes supportive care, tetracain eye drop -Reevaluation of the patient after these medicines showed that the patient improved -PCP office notes or outside notes reviewed -Discussion with attending Dr. Earlene Plater -Escalation to admission/observation considered: patients feels much better, is comfortable with discharge, and will follow up with PCP -Prescription medication considered, patient comfortable with OTC meds -Social Determinant of Health considered which includes tobacco use         Final Clinical Impression(s) / ED Diagnoses Final diagnoses:  Periorbital hematoma of right eye    Rx / DC Orders ED Discharge Orders     None         Fayrene Helper, PA-C 06/08/23 1729    Laurence Spates, MD 06/11/23 [phone]

## 2023-06-08 NOTE — Discharge Instructions (Addendum)
And did not show any broken bone or blood in your brain.  No significant pressure behind your eye.  Please continue to use cool compress or ice pack to help with swelling and bruising.  Follow-up with your doctor for further care.

## 2023-06-08 NOTE — ED Notes (Signed)
Patient is being discharged from the Urgent Care and sent to the Emergency Department via POV . Per [place], patient is in need of higher level of care due to eye trauma and on blood thinner. Patient is aware and verbalizes understanding of plan of care.  Vitals:   06/08/23 1201  BP: (!) 174/134  Pulse: (!) 105  Resp: 18  Temp: 97.9 F (36.6 C)  SpO2: 100%

## 2023-06-08 NOTE — Telephone Encounter (Signed)
Wife stated patient fell on Monday, 10/28 and has a black eye.  Wife stated patient will be doing lab work today and wants nurse to review care instructions for patients on blood thinner information with patient when he comes in.

## 2023-06-08 NOTE — Discharge Instructions (Addendum)
Due to your head trauma and being on Eliquis, advised you to head down to the nearest emergency department for further evaluation with a can do a CT scan.  Your blood pressure is elevated today in clinic, you reported this is your baseline, however, in combination with the head trauma that is concerning.  If this is your baseline please follow-up with your primary care provider for further evaluation and management of your hypertension.  If you choose not to go down to the emergency department, please know that you could have a slow brain bleed and be at risk for coma and death.  Seek immediate care for any vision loss, blurred vision, severe headache, or any new concerning symptoms.

## 2023-06-09 LAB — BASIC METABOLIC PANEL
BUN/Creatinine Ratio: 14 (ref 10–24)
BUN: 14 mg/dL (ref 8–27)
CO2: 29 mmol/L (ref 20–29)
Calcium: 10 mg/dL (ref 8.6–10.2)
Chloride: 99 mmol/L (ref 96–106)
Creatinine, Ser: 1.03 mg/dL (ref 0.76–1.27)
Glucose: 99 mg/dL (ref 70–99)
Potassium: 4.8 mmol/L (ref 3.5–5.2)
Sodium: 140 mmol/L (ref 134–144)
eGFR: 80 mL/min/{1.73_m2} (ref >59)

## 2023-06-09 LAB — CBC
Hematocrit: 46.2 % (ref 37.5–51.0)
Hemoglobin: 15.5 g/dL (ref 13.0–17.7)
MCH: 35 pg — ABNORMAL HIGH (ref 26.6–33.0)
MCHC: 33.5 g/dL (ref 31.5–35.7)
MCV: 104 fL — ABNORMAL HIGH (ref 79–97)
Platelets: 174 10*3/uL (ref 150–450)
RBC: 4.43 x10E6/uL (ref 4.14–5.80)
RDW: 13.5 % (ref 11.6–15.4)
WBC: 1.9 10*3/uL — CL (ref 3.4–10.8)

## 2023-06-10 ENCOUNTER — Other Ambulatory Visit: Payer: Self-pay | Admitting: Family Medicine

## 2023-06-18 NOTE — Pre-Procedure Instructions (Signed)
Attempted to call patient regarding procedure instructions for tomorrow.  Left voice mail on the following items: Arrival time 1100 Nothing to eat or drink after midnight No meds AM of procedure Responsible person to drive you home and stay with you for 24 hrs  Have you missed any doses of anti-coagulant Eliquis- should be taken twice a day, if you have missed any doses please let us know.  Don't take dose in the morning.

## 2023-06-19 ENCOUNTER — Other Ambulatory Visit: Payer: Self-pay

## 2023-06-19 ENCOUNTER — Ambulatory Visit (HOSPITAL_COMMUNITY): Payer: BC Managed Care – PPO | Admitting: Anesthesiology

## 2023-06-19 ENCOUNTER — Ambulatory Visit (HOSPITAL_COMMUNITY)
Admission: RE | Disposition: A | Discharge status: Home / Self Care | Payer: Self-pay | Source: Home / Self Care | Attending: Cardiology

## 2023-06-19 ENCOUNTER — Ambulatory Visit (HOSPITAL_COMMUNITY)
Admission: RE | Admit: 2023-06-19 | Discharge: 2023-06-19 | Disposition: A | Discharge status: Home / Self Care | Payer: BC Managed Care – PPO | Attending: Cardiology | Admitting: Cardiology

## 2023-06-19 DIAGNOSIS — I2510 Atherosclerotic heart disease of native coronary artery without angina pectoris: Secondary | ICD-10-CM | POA: Insufficient documentation

## 2023-06-19 DIAGNOSIS — Z8673 Personal history of transient ischemic attack (TIA), and cerebral infarction without residual deficits: Secondary | ICD-10-CM | POA: Insufficient documentation

## 2023-06-19 DIAGNOSIS — I5022 Chronic systolic (congestive) heart failure: Secondary | ICD-10-CM | POA: Insufficient documentation

## 2023-06-19 DIAGNOSIS — Z7901 Long term (current) use of anticoagulants: Secondary | ICD-10-CM | POA: Insufficient documentation

## 2023-06-19 DIAGNOSIS — I480 Paroxysmal atrial fibrillation: Secondary | ICD-10-CM | POA: Diagnosis not present

## 2023-06-19 DIAGNOSIS — I4892 Unspecified atrial flutter: Secondary | ICD-10-CM | POA: Diagnosis present

## 2023-06-19 DIAGNOSIS — I482 Chronic atrial fibrillation, unspecified: Secondary | ICD-10-CM | POA: Diagnosis not present

## 2023-06-19 DIAGNOSIS — I428 Other cardiomyopathies: Secondary | ICD-10-CM | POA: Diagnosis not present

## 2023-06-19 HISTORY — PX: A-FLUTTER ABLATION: EP1230

## 2023-06-19 HISTORY — PX: LOOP RECORDER INSERTION: EP1214

## 2023-06-19 SURGERY — A-FLUTTER ABLATION
Anesthesia: General

## 2023-06-19 MED ORDER — HEPARIN SODIUM (PORCINE) 1000 UNIT/ML IJ SOLN
INTRAMUSCULAR | Status: DC | PRN
  Administered 2023-06-19: 1000 [IU] via INTRAVENOUS

## 2023-06-19 MED ORDER — LIDOCAINE 2% (20 MG/ML) 5 ML SYRINGE
INTRAMUSCULAR | Status: DC | PRN
  Administered 2023-06-19: 60 mg via INTRAVENOUS

## 2023-06-19 MED ORDER — SODIUM CHLORIDE 0.9 % IV SOLN: INTRAVENOUS | Status: DC

## 2023-06-19 MED ORDER — MIDAZOLAM HCL 5 MG/5ML IJ SOLN
INTRAMUSCULAR | Status: AC
  Filled 2023-06-19: qty 5

## 2023-06-19 MED ORDER — PHENYLEPHRINE 80 MCG/ML (10ML) SYRINGE FOR IV PUSH (FOR BLOOD PRESSURE SUPPORT)
PREFILLED_SYRINGE | INTRAVENOUS | Status: DC | PRN
  Administered 2023-06-19: 160 ug via INTRAVENOUS

## 2023-06-19 MED ORDER — ACETAMINOPHEN 325 MG PO TABS: 650.0000 mg | ORAL_TABLET | ORAL | Status: DC | PRN

## 2023-06-19 MED ORDER — SODIUM CHLORIDE 0.9% FLUSH: 3.0000 mL | Freq: Two times a day (BID) | INTRAVENOUS | Status: DC

## 2023-06-19 MED ORDER — ONDANSETRON HCL 4 MG/2ML IJ SOLN: 4.0000 mg | Freq: Four times a day (QID) | INTRAMUSCULAR | Status: DC | PRN

## 2023-06-19 MED ORDER — MIDAZOLAM HCL 2 MG/2ML IJ SOLN
INTRAMUSCULAR | Status: DC | PRN
  Administered 2023-06-19 (×2): 1 mg via INTRAVENOUS

## 2023-06-19 MED ORDER — SODIUM CHLORIDE 0.9 % IV SOLN: 250.0000 mL | INTRAVENOUS | Status: DC | PRN

## 2023-06-19 MED ORDER — ONDANSETRON HCL 4 MG/2ML IJ SOLN
INTRAMUSCULAR | Status: DC | PRN
  Administered 2023-06-19: 4 mg via INTRAVENOUS

## 2023-06-19 MED ORDER — DEXAMETHASONE SODIUM PHOSPHATE 10 MG/ML IJ SOLN
INTRAMUSCULAR | Status: DC | PRN
  Administered 2023-06-19: 10 mg via INTRAVENOUS

## 2023-06-19 MED ORDER — PHENYLEPHRINE HCL-NACL 20-0.9 MG/250ML-% IV SOLN
INTRAVENOUS | Status: DC | PRN
  Administered 2023-06-19: 60 ug/min via INTRAVENOUS

## 2023-06-19 MED ORDER — SODIUM CHLORIDE 0.9% FLUSH: 3.0000 mL | INTRAVENOUS | Status: DC | PRN

## 2023-06-19 MED ORDER — EPHEDRINE SULFATE-NACL 50-0.9 MG/10ML-% IV SOSY
PREFILLED_SYRINGE | INTRAVENOUS | Status: DC | PRN
  Administered 2023-06-19: 10 mg via INTRAVENOUS

## 2023-06-19 MED ORDER — FENTANYL CITRATE (PF) 100 MCG/2ML IJ SOLN
INTRAMUSCULAR | Status: AC
  Filled 2023-06-19: qty 2

## 2023-06-19 MED ORDER — APIXABAN 5 MG PO TABS
5.0000 mg | ORAL_TABLET | Freq: Two times a day (BID) | ORAL | Status: DC
  Administered 2023-06-19: 5 mg via ORAL
  Filled 2023-06-19: qty 1

## 2023-06-19 MED ORDER — ROCURONIUM BROMIDE 10 MG/ML (PF) SYRINGE
PREFILLED_SYRINGE | INTRAVENOUS | Status: DC | PRN
  Administered 2023-06-19: 50 mg via INTRAVENOUS

## 2023-06-19 MED ORDER — FENTANYL CITRATE (PF) 250 MCG/5ML IJ SOLN
INTRAMUSCULAR | Status: DC | PRN
  Administered 2023-06-19 (×2): 50 ug via INTRAVENOUS

## 2023-06-19 MED ORDER — PROPOFOL 10 MG/ML IV BOLUS
INTRAVENOUS | Status: DC | PRN
  Administered 2023-06-19: 100 mg via INTRAVENOUS

## 2023-06-19 MED ORDER — HEPARIN (PORCINE) IN NACL 1000-0.9 UT/500ML-% IV SOLN
INTRAVENOUS | Status: DC | PRN
  Administered 2023-06-19: 500 mL

## 2023-06-19 MED ORDER — SUGAMMADEX SODIUM 200 MG/2ML IV SOLN
INTRAVENOUS | Status: DC | PRN
  Administered 2023-06-19: 200 mg via INTRAVENOUS

## 2023-06-19 MED ORDER — LIDOCAINE-EPINEPHRINE 1 %-1:100000 IJ SOLN
INTRAMUSCULAR | Status: DC | PRN
  Administered 2023-06-19: 10 mL

## 2023-06-19 SURGICAL SUPPLY — 16 items
BAG SNAP BAND KOVER 36X36 (MISCELLANEOUS) IMPLANT
CATH 8FR REPROCESSED SOUNDSTAR (CATHETERS) ×1 IMPLANT
CATH 8FR SOUNDSTAR REPROCESSED (CATHETERS) IMPLANT
CATH SMTCH THERMOCOOL SF DF (CATHETERS) IMPLANT
CATH WEBSTER BI DIR CS D-F CRV (CATHETERS) IMPLANT
CLOSURE PERCLOSE PROSTYLE (VASCULAR PRODUCTS) IMPLANT
MONITOR LUX-DX II+ M312 (Prosthesis & Implant Heart) IMPLANT
PACK EP LF (CUSTOM PROCEDURE TRAY) ×1 IMPLANT
PACK LOOP INSERTION (CUSTOM PROCEDURE TRAY) ×1 IMPLANT
PAD DEFIB RADIO PHYSIO CONN (PAD) ×1 IMPLANT
PATCH CARTO3 (PAD) IMPLANT
SHEATH CARTO VIZIGO SM CVD (SHEATH) IMPLANT
SHEATH PINNACLE 8F 10CM (SHEATH) IMPLANT
SHEATH PINNACLE 9F 10CM (SHEATH) IMPLANT
SHEATH PROBE COVER 6X72 (BAG) IMPLANT
TUBING SMART ABLATE COOLFLOW (TUBING) IMPLANT

## 2023-06-19 NOTE — Anesthesia Procedure Notes (Signed)
Procedure Name: Intubation Date/Time: 06/19/2023 2:12 PM  Performed by: April Holding, CRNAPre-anesthesia Checklist: Patient identified, Emergency Drugs available, Suction available and Patient being monitored Patient Re-evaluated:Patient Re-evaluated prior to induction Oxygen Delivery Method: Circle System Utilized Preoxygenation: Pre-oxygenation with 100% oxygen Induction Type: IV induction Ventilation: Mask ventilation without difficulty Laryngoscope Size: 2 and Miller Grade View: Grade I Tube type: Oral Tube size: 7.5 mm Number of attempts: 1 Airway Equipment and Method: Stylet and Oral airway Placement Confirmation: ETT inserted through vocal cords under direct vision, positive ETCO2 and breath sounds checked- equal and bilateral Secured at: 23 cm Tube secured with: Tape Dental Injury: Teeth and Oropharynx as per pre-operative assessment

## 2023-06-19 NOTE — Discharge Instructions (Signed)
Cardiac Ablation, Care After  This sheet gives you information about how to care for yourself after your procedure. Your health care provider may also give you more specific instructions. If you have problems or questions, contact your health care provider. What can I expect after the procedure? After the procedure, it is common to have: Bruising around your puncture site. Tenderness around your puncture site. Skipped heartbeats. If you had an atrial fibrillation ablation, you may have atrial fibrillation during the first several months after your procedure.  Tiredness (fatigue).  Follow these instructions at home: Puncture site care  Follow instructions from your health care provider about how to take care of your puncture site. Make sure you: If present, leave stitches (sutures), skin glue, or adhesive strips in place. These skin closures may need to stay in place for up to 2 weeks. If adhesive strip edges start to loosen and curl up, you may trim the loose edges. Do not remove adhesive strips completely unless your health care provider tells you to do that. If a large square bandage is present, this may be removed 24 hours after surgery.  Check your puncture site every day for signs of infection. Check for: Redness, swelling, or pain. Fluid or blood. If your puncture site starts to bleed, lie down on your back, apply firm pressure to the area, and contact your health care provider. Warmth. Pus or a bad smell. A pea or small marble sized lump at the site is normal and can take up to three months to resolve.  Driving Do not drive for at least 4 days after your procedure or however long your health care provider recommends. (Do not resume driving if you have previously been instructed not to drive for other health reasons.) Do not drive or use heavy machinery while taking prescription pain medicine. Activity Avoid activities that take a lot of effort for at least 7 days after your  procedure. Do not lift anything that is heavier than 5 lb (4.5 kg) for one week.  No sexual activity for 1 week.  Return to your normal activities as told by your health care provider. Ask your health care provider what activities are safe for you. General instructions Take over-the-counter and prescription medicines only as told by your health care provider. Do not use any products that contain nicotine or tobacco, such as cigarettes and e-cigarettes. If you need help quitting, ask your health care provider. You may shower after 72 hours, but Do not take baths, swim, or use a hot tub for 1 week.  Do not drink alcohol for 24 hours after your procedure. Keep all follow-up visits as told by your health care provider. This is important. Contact a health care provider if: You have redness, mild swelling, or pain around your puncture site. You have fluid or blood coming from your puncture site that stops after applying firm pressure to the area. Your puncture site feels warm to the touch. You have pus or a bad smell coming from your puncture site. You have a fever. You have chest pain or discomfort that spreads to your neck, jaw, or arm. You have chest pain that is worse with lying on your back or taking a deep breath. You are sweating a lot. You feel nauseous. You have a fast or irregular heartbeat. You have shortness of breath. You are dizzy or light-headed and feel the need to lie down. You have pain or numbness in the arm or leg closest to your puncture  site. Get help right away if: Your puncture site suddenly swells. Your puncture site is bleeding and the bleeding does not stop after applying firm pressure to the area. These symptoms may represent a serious problem that is an emergency. Do not wait to see if the symptoms will go away. Get medical help right away. Call your local emergency services (911 in the [place] Do not drive yourself to the hospital. Summary After the procedure, it  is normal to have bruising and tenderness at the puncture site in your groin, neck, or forearm. Check your puncture site every day for signs of infection. Get help right away if your puncture site is bleeding and the bleeding does not stop after applying firm pressure to the area. This is a medical emergency. This information is not intended to replace advice given to you by your health care provider. Make sure you discuss any questions you have with your health care provider.   Care After Your Loop Recorder   Monitor your cardiac device site for redness, swelling, and drainage. Call the device clinic at [phone] if you experience these symptoms or fever/chills.  If you notice bleeding from your site, hold firm, but gently pressure with two fingers for 5 minutes. Dried blood on the steri-strips when removing the outer bandage is normal.   Keep the large square bandage on your site for 24 hours and then you may remove it yourself. Keep the steri-strips underneath in place.   You may shower after 72 hours / 3 days from your procedure with the steri-strips in place. They will usually fall off on their own, or may be removed after 10 days. Pat dry.   Avoid lotions, ointments, or perfumes over your incision until it is well-healed.  Please do not submerge in water until your site is completely healed.   Your device is MRI compatible.   Remote monitoring is used to monitor your cardiac device from home. This monitoring is scheduled every month by our office. It allows [place] to keep an eye on the function of your device to ensure it is working properly.

## 2023-06-19 NOTE — H&P (Signed)
Electrophysiology Office Note:     Date:  06/19/2023    ID:  Adrian [name], DOB June 29, 1956, MRN [id number]   [facility] Cardiologist:  Armanda Magic, MD  [facility] Electrophysiologist:  Lanier Prude, MD    Referring MD: Sharlene Dory, PA-C    Chief Complaint: Atrial flutter   History of Present Illness:     Adrian [name] is a 67 y.o. malewho I am seeing today for an evaluation of atrial flutter at the request of Jari Favre, PA-C.   The patient was last seen by Julian Hy on January 23, 2023.   The patient has a medical history that includes nonischemic cardiomyopathy, nonobstructive coronary artery disease, atrial flutter on Eliquis, diverticulitis.  The patient has a history of stroke after undergoing robotic resection of his bowel for diverticulitis..   The patient is undergoing cardioversion in the past, as recently as Dec 19, 2022.  When he saw Julian Hy in follow-up he was back out of rhythm.   Today he is doing well.  He works at [employer] and is active.   Presents for CTI ablation and ILR implant. Procedures reviewed.     Objective Their past medical, social and family history was reveiwed.     ROS:   Please see the history of present illness.    All other systems reviewed and are negative.   EKGs/Labs/Other Studies Reviewed:     The following studies were reviewed today:   Dec 20, 2022 transesophageal echo EF 40 to 45% RV mildly reduced No left atrial appendage thrombus No significant valvular abnormalities   Dec 19, 2022 EKG shows atrial flutter with 2 1 AV conduction   October 15, 2015 EKG shows typical appearing atrial flutter with variable AV conduction            Physical Exam:     VS:  BP 168/103   Pulse 98   Ht 5\' 8"  (1.727 m)   Wt 132 lb 6.4 oz (60.1 kg)   SpO2 99%   BMI 20.13 kg/m         Wt Readings from Last 3 Encounters:  05/11/23 132 lb 6.4 oz (60.1 kg)  01/23/23 135 lb 12.8 oz (61.6 kg)  01/03/23 139 lb 12.8 oz (63.4 kg)      GEN:   Well nourished, well developed in no acute distress CARDIAC: Regular rhythm, no murmurs, rubs, gallops RESPIRATORY:  Clear to auscultation without rales, wheezing or rhonchi          Assessment ASSESSMENT AND PLAN:     1. PAF (paroxysmal atrial fibrillation) (HCC)   2. Chronic atrial fibrillation (HCC)   3. NICM (nonischemic cardiomyopathy) (HCC)       #Persistent atrial flutter #High risk drug monitoring-amiodarone Symptomatic and associated with reduced ejection fraction.  I do not see any evidence of atrial fibrillation on prior EKGs. He is on Eliquis for stroke prophylaxis.  He has been started on amiodarone but prefers to avoid the risks associated with long-term use.   Rhythm control is indicated.  I discussed treatment strategies including antiarrhythmic drugs and catheter ablation.  I discussed the possibility of developing atrial fibrillation after a successful atrial flutter ablation.   Discussed treatment options today for AFL including antiarrhythmic drug therapy and ablation. Discussed risks, recovery and likelihood of success with each treatment strategy. Risk, benefits, and alternatives to EP study and ablation for AFL were discussed. These risks include but are not limited to stroke, bleeding, vascular damage, tamponade,  perforation, worsening renal function, coronary vasospasm and death.  Discussed potential need for repeat ablation procedures and antiarrhythmic drugs after an initial ablation. The patient understands these risk and wishes to proceed.  We will therefore proceed with catheter ablation at the next available time.  Carto, ICE, anesthesia are requested for the procedure.     Will plan for ILR implant at the time of ablation for AF surveillance.  Plan for [employer] loop.  I discussed the loop recorder implant procedure in detail with the patient include the risks and associated monthly monitoring cost and he wishes to proceed at the time of flutter  ablation.   LFTs in June okay.  TSH in May okay. Plan to stop amiodarone after ablation.   #Chronic systolic heart failure #Nonischemic cardiomyopathy NYHA class II.  Warm and dry on exam.  Continue GDMT.      Presents for CTI ablation and ILR implant. Procedures reviewed.         Signed, Rossie Muskrat. Lalla Brothers, MD, [facility], [facility] 06/19/2023 Electrophysiology [facility]

## 2023-06-19 NOTE — Anesthesia Preprocedure Evaluation (Signed)
Anesthesia Evaluation  Patient identified by MRN, date of birth, ID band Patient awake    Reviewed: NPO status , Patient's Chart, lab work & pertinent test results  Airway Mallampati: II  TM Distance: >3 FB Neck ROM: Full    Dental no notable dental hx.    Pulmonary neg pulmonary ROS, Current Smoker   Pulmonary exam normal        Cardiovascular hypertension, Pt. on medications and Pt. on home beta blockers + CAD  + dysrhythmias Atrial Fibrillation  Rhythm:Regular Rate:Normal     Neuro/Psych negative neurological ROS  negative psych ROS   GI/Hepatic negative GI ROS, Neg liver ROS,,,  Endo/Other  Hypothyroidism    Renal/GU negative Renal ROS  negative genitourinary   Musculoskeletal negative musculoskeletal ROS (+)    Abdominal Normal abdominal exam  (+)   Peds  Hematology Lab Results      Component                Value               Date                      WBC                      1.9 (LL)            06/08/2023                HGB                      15.5                06/08/2023                HCT                      46.2                06/08/2023                MCV                      104 (H)             06/08/2023                PLT                      174                 06/08/2023              Anesthesia Other Findings   Reproductive/Obstetrics                             Anesthesia Physical Anesthesia Plan  ASA: 3  Anesthesia Plan: General   Post-op Pain Management:    Induction: Intravenous  PONV Risk Score and Plan: 1 and Ondansetron, Dexamethasone, Midazolam and Treatment may vary due to age or medical condition  Airway Management Planned: Mask and Oral ETT  Additional Equipment: None  Intra-op Plan:   Post-operative Plan: Extubation in OR  Informed Consent: I have reviewed the patients History and Physical, chart, labs and discussed the procedure including  the risks, benefits and alternatives for the proposed anesthesia with the patient  or authorized representative who has indicated his/her understanding and acceptance.     Dental advisory given  Plan Discussed with: CRNA  Anesthesia Plan Comments:        Anesthesia Quick Evaluation

## 2023-06-19 NOTE — Transfer of Care (Signed)
Immediate Anesthesia Transfer of Care Note  Patient: Adrian [name]  Procedure(s) Performed: A-FLUTTER ABLATION LOOP RECORDER INSERTION  Patient Location: Cath Lab  Anesthesia Type:General  Level of Consciousness: awake, alert , and oriented  Airway & Oxygen Therapy: Patient Spontanous Breathing and Patient connected to nasal cannula oxygen  Post-op Assessment: Report given to RN and Post -op Vital signs reviewed and stable  Post vital signs: Reviewed and stable  Last Vitals:  Vitals Value Taken Time  BP 111/65 06/19/23 1511  Temp    Pulse 59 06/19/23 1513  Resp 15 06/19/23 1513  SpO2 99 % 06/19/23 1513  Vitals shown include unfiled device data.  Last Pain:  Vitals:   06/19/23 1513  TempSrc:   PainSc: 0-No pain         Complications: There were no known notable events for this encounter.

## 2023-06-20 ENCOUNTER — Encounter (HOSPITAL_COMMUNITY): Payer: Self-pay | Admitting: Cardiology

## 2023-06-20 NOTE — Anesthesia Postprocedure Evaluation (Signed)
Anesthesia Post Note  Patient: TIERNEY HEMMERLING  Procedure(s) Performed: A-FLUTTER ABLATION LOOP RECORDER INSERTION     Patient location during evaluation: PACU Anesthesia Type: General Level of consciousness: awake and alert Pain management: pain level controlled Vital Signs Assessment: post-procedure vital signs reviewed and stable Respiratory status: spontaneous breathing, nonlabored ventilation, respiratory function stable and patient connected to nasal cannula oxygen Cardiovascular status: blood pressure returned to baseline and stable Postop Assessment: no apparent nausea or vomiting Anesthetic complications: no   There were no known notable events for this encounter.  Last Vitals:  Vitals:   06/19/23 1800 06/19/23 1856  BP: 111/81 117/81  Pulse: 70 71  Resp: 19 18  Temp:    SpO2: 96% 96%    Last Pain:  Vitals:   06/19/23 1513  TempSrc:   PainSc: 0-No pain                 Earl Lites P Shyleigh Daughtry

## 2023-06-21 ENCOUNTER — Telehealth: Payer: Self-pay

## 2023-06-21 NOTE — Telephone Encounter (Signed)
-----   Message from Mariam Dollar Tillery sent at 06/19/2023  2:17 PM EST ----- Regarding: Same Day Discharge LOOP 06/19/23 Dr. Lalla Brothers (loop s/p ablation!)

## 2023-06-21 NOTE — Telephone Encounter (Signed)
  Loop Recorder Follow up   Is patient connected to [facility]? No, will forward to CMA pool to follow up with patient.  Have steri-strips fallen off or been removed? No   Does the patient need in office follow up? No   Please continue to monitor your cardiac device site for redness, swelling, and drainage. Call the device clinic at [phone] if you experience these symptoms, fever/chills, or have questions about your device.   Remote monitoring is used to monitor your cardiac device from home. This monitoring is scheduled every month by our office. It allows [place] to keep an eye on the functioning of your device to ensure it is working properly.

## 2023-06-22 ENCOUNTER — Encounter: Payer: Self-pay | Admitting: Cardiology

## 2023-06-22 ENCOUNTER — Ambulatory Visit: Payer: BC Managed Care – PPO | Attending: Cardiology | Admitting: Cardiology

## 2023-06-22 VITALS — BP 130/84 | HR 53 | Resp 16 | Ht 68.0 in | Wt 140.0 lb

## 2023-06-22 DIAGNOSIS — R0989 Other specified symptoms and signs involving the circulatory and respiratory systems: Secondary | ICD-10-CM

## 2023-06-22 DIAGNOSIS — I2510 Atherosclerotic heart disease of native coronary artery without angina pectoris: Secondary | ICD-10-CM

## 2023-06-22 DIAGNOSIS — I428 Other cardiomyopathies: Secondary | ICD-10-CM

## 2023-06-22 DIAGNOSIS — E785 Hyperlipidemia, unspecified: Secondary | ICD-10-CM | POA: Diagnosis not present

## 2023-06-22 DIAGNOSIS — I4892 Unspecified atrial flutter: Secondary | ICD-10-CM | POA: Diagnosis not present

## 2023-06-22 MED FILL — Midazolam HCl Inj 5 MG/5ML (Base Equivalent): INTRAMUSCULAR | Qty: 2 | Status: AC

## 2023-06-22 MED FILL — Fentanyl Citrate Preservative Free (PF) Inj 100 MCG/2ML: INTRAMUSCULAR | Qty: 2 | Status: AC

## 2023-06-22 NOTE — Progress Notes (Signed)
Cardiology Office Note:    Date:  06/22/2023   ID:  Adrian [name], DOB Mar 27, 1956, MRN [id number]  PCP:  Kristian Covey, MD  Cardiologist:  Armanda Magic, MD    Referring MD: Kristian Covey, MD   Chief Complaint  Patient presents with   Atrial Flutter   Coronary Artery Disease   Hyperlipidemia   Cardiomyopathy    History of Present Illness:    Adrian [name] is a 67 y.o. male with a hx of atrial flutter in the setting of acute diverticulitis, nonischemic cardiomyopathy LVEF 30 to 35%, nonobstructive CAD on cath in 2017, PAF on Eliquis for CHA2DS2-VASc of 2.  2D echo 03/09/2019 normal LVEF 60 to 65%.  He had recurrent atrial fibrillation underwent DCCV on 12/19/2022.  Unfortunately he reverted back to atrial flutter with RVR.  He was set up for repeat cardioversion but on arrival for procedure he was back in sinus bradycardia.  He was referred to EP and underwent a flutter ablation along with implantation of ILR for A-fib surveillance.  He is here today for followup and is doing well.  He denies any chest pain or pressure, SOB, DOE, PND, orthopnea, LE edema, dizziness, palpitations or syncope. He is compliant with his meds and is tolerating meds with no SE.     Past Medical History:  Diagnosis Date   AF (paroxysmal atrial fibrillation) (HCC) 11/29/2018   Atrial flutter (HCC) 10/12/2015   a. TEE 3/17 with ? LAA clot-->s/p TEE/DCCV 11/24/2015; s/p DCCV 12/2022;s/p ablation 06/2023   CAD (coronary artery disease), native coronary artery 12/06/2015   a. 10/2015 MV: EF  37%, reversible defect inferior apex, intermediate risk findings; b. 10/2015 Cath: 20% mid RCA.   Colonic diverticular abscess    Diverticulitis    History of chemotherapy 2005   Cisplatin   Hypothyroidism    NICM (nonischemic cardiomyopathy) (HCC) 10/14/2015   a. Tachy mediated?;  b. Echo 3/17 - Mild concentric LVH, EF 30-35%, anteroseptal, anterior, anterolateral, apical anterior, lateral hypokinesis, trivial MR,  mild to moderately reduced RVSF; c. LHC 3/17 - mRCA 20%   Radiation NOv.3,2005-Dec. 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  XRT    Past Surgical History:  Procedure Laterality Date   A-FLUTTER ABLATION N/A 06/19/2023   Procedure: A-FLUTTER ABLATION;  Surgeon: Lanier Prude, MD;  Location: [facility] INVASIVE CV LAB;  Service: Cardiovascular;  Laterality: N/A;   APPENDECTOMY N/A 03/07/2019   Procedure: ROBOTIC ASSISTED APPENDECTOMY;  Surgeon: Karie Soda, MD;  Location: [facility] ORS;  Service: General;  Laterality: N/A;   CARDIAC CATHETERIZATION N/A 10/18/2015   Procedure: Left Heart Cath and Coronary Angiography;  Surgeon: Kathleene Hazel, MD;  Location: [facility] INVASIVE CV LAB;  Service: Cardiovascular;  Laterality: N/A;   CARDIOVERSION N/A 11/24/2015   Procedure: CARDIOVERSION;  Surgeon: Wendall Stade, MD;  Location: [facility] ENDOSCOPY;  Service: Cardiovascular;  Laterality: N/A;   CARDIOVERSION N/A 12/20/2022   Procedure: CARDIOVERSION;  Surgeon: [name], MD;  Location: [facility] INVASIVE CV LAB;  Service: Cardiovascular;  Laterality: N/A;   CYSTOSCOPY WITH STENT PLACEMENT Bilateral 03/07/2019   Procedure: CYSTOSCOPY WITH BILATERAL FIREFLY INJECTION;  Surgeon: Crist Fat, MD;  Location: [facility] ORS;  Service: Urology;  Laterality: Bilateral;   GASTROSTOMY TUBE PLACEMENT  07/04/2004   IR - G tube for tonsilar cancer   IR ANGIO INTRA EXTRACRAN SEL COM CAROTID INNOMINATE UNI R MOD SED  03/09/2019  IR ANGIO VERTEBRAL SEL VERTEBRAL UNI R MOD SED  03/09/2019   IR CT HEAD LTD  03/09/2019   IR PERCUTANEOUS ART THROMBECTOMY/INFUSION INTRACRANIAL INC DIAG ANGIO  03/09/2019   IR RADIOLOGIST EVAL & MGMT  12/18/2018   LAMINECTOMY     C5/placement of steel plate   LOOP RECORDER INSERTION N/A 06/19/2023   Procedure: LOOP RECORDER INSERTION;  Surgeon: Lanier Prude, MD;  Location: [facility] INVASIVE CV LAB;  Service: Cardiovascular;  Laterality: N/A;   NECK SURGERY  2003    replaced disk   RADIOLOGY WITH ANESTHESIA N/A 03/08/2019   Procedure: IR WITH ANESTHESIA;  Surgeon: Julieanne Cotton, MD;  Location: [facility] OR;  Service: Radiology;  Laterality: N/A;   TEE WITHOUT CARDIOVERSION N/A 10/15/2015   Procedure: TRANSESOPHAGEAL ECHOCARDIOGRAM (TEE);  Surgeon: Laurey Morale, MD;  Location: [facility] ENDOSCOPY;  Service: Cardiovascular;  Laterality: N/A;   TEE WITHOUT CARDIOVERSION N/A 11/24/2015   Procedure: TRANSESOPHAGEAL ECHOCARDIOGRAM (TEE);  Surgeon: Wendall Stade, MD;  Location: [facility] ENDOSCOPY;  Service: Cardiovascular;  Laterality: N/A;   TEE WITHOUT CARDIOVERSION N/A 12/20/2022   Procedure: TRANSESOPHAGEAL ECHOCARDIOGRAM;  Surgeon: [name], MD;  Location: [facility] INVASIVE CV LAB;  Service: Cardiovascular;  Laterality: N/A;   XI ROBOTIC ASSISTED COLOSTOMY TAKEDOWN N/A 03/07/2019   Procedure: XI ROBOTIC ASSISTED LOW ANTERIOR RESECTION, RIGID PROCTOSCOPY;  Surgeon: Karie Soda, MD;  Location: [facility] ORS;  Service: General;  Laterality: N/A;    Current Medications: Current Meds  Medication Sig   amiodarone (PACERONE) 200 MG tablet Take 1 tablet (200 mg total) by mouth daily.   apixaban (ELIQUIS) 5 MG TABS tablet Take 1 tablet (5 mg total) by mouth 2 (two) times daily.   atorvastatin (LIPITOR) 40 MG tablet TAKE 1 TABLET BY MOUTH EVERY DAY   ibuprofen (ADVIL) 200 MG tablet Take 200-400 mg by mouth daily as needed (pain.).   levothyroxine (SYNTHROID) 125 MCG tablet TAKE 1 TABLET BY MOUTH EVERY DAY (Patient taking differently: Take 112 mcg by mouth daily.)   metoprolol tartrate (LOPRESSOR) 25 MG tablet Take 1 tablet (25 mg total) by mouth 2 (two) times daily.     Allergies:   Patient has no known allergies.   Social History   Socioeconomic History   Marital status: Married    Spouse name: Not on file   Number of children: Not on file   Years of education: Not on file   Highest education level: Not on file  Occupational History   Not on file  Tobacco Use   Smoking  status: Every Day    Current packs/day: 0.50    Average packs/day: 0.5 packs/day for 20.0 years (10.0 ttl pk-yrs)    Types: Cigarettes   Smokeless tobacco: Never   Tobacco comments:    1/2 pack per day  Vaping Use   Vaping status: Never Used  Substance and Sexual Activity   Alcohol use: Yes    Alcohol/week: 12.0 standard drinks of alcohol    Types: 12 Cans of beer per week    Comment: couple of beers each day   Drug use: No   Sexual activity: Not on file  Other Topics Concern   Not on file  Social History Narrative   Not on file   Social Determinants of Health   Financial Resource Strain: Not on file  Food Insecurity: No Food Insecurity (12/19/2022)   Hunger Vital Sign    Worried About Running Out of Food in the Last Year: Never true    Ran  Out of Food in the Last Year: Never true  Transportation Needs: No Transportation Needs (12/19/2022)   PRAPARE - Administrator, Civil Service (Medical): No    Lack of Transportation (Non-Medical): No  Physical Activity: Not on file  Stress: Not on file  Social Connections: Not on file     Family History: The patient's family history includes Cancer in his brother; Colon cancer in his father; Prostate cancer in his father; Skin cancer in his mother. There is no history of Esophageal cancer, Rectal cancer, or Stomach cancer.  ROS:   Please see the history of present illness.    Review of Systems  Musculoskeletal:  Negative for gout.    All other systems reviewed and negative.   EKGs/Labs/Other Studies Reviewed:    The following studies were reviewed today: none   Recent Labs: 12/19/2022: B Natriuretic Peptide 92.1; TSH 1.541 01/03/2023: Magnesium 2.0 01/23/2023: ALT 30 06/08/2023: BUN 14; Creatinine, Ser 1.03; Hemoglobin 15.5; Platelets 174; Potassium 4.8; Sodium 140   Recent Lipid Panel    Component Value Date/Time   CHOL 140 02/02/2023 1055   TRIG 70 02/02/2023 1055   HDL 58 02/02/2023 1055   CHOLHDL 2.4  02/02/2023 1055   CHOLHDL 3 04/22/2021 0914   VLDL 39.4 04/22/2021 0914   LDLCALC 68 02/02/2023 1055   LDLDIRECT 143.3 06/20/2012 1148    Physical Exam:    VS:  BP 130/84   Pulse (!) 53   Resp 16   Ht 5\' 8"  (1.727 m)   Wt 140 lb (63.5 kg)   SpO2 97%   BMI 21.29 kg/m     Wt Readings from Last 3 Encounters:  06/22/23 140 lb (63.5 kg)  06/19/23 137 lb (62.1 kg)  06/08/23 136 lb (61.7 kg)    GEN: Well nourished, well developed in no acute distress HEENT: Normal NECK: No JVD; right carotid bruit LYMPHATICS: No lymphadenopathy CARDIAC:RRR, no murmurs, rubs, gallops RESPIRATORY:  Clear to auscultation without rales, wheezing or rhonchi  ABDOMEN: Soft, non-tender, non-distended MUSCULOSKELETAL:  No edema; No deformity  SKIN: Warm and dry mild ecchymosis over ILR site with no hematoma or oozing NEUROLOGIC:  Alert and oriented x 3 PSYCHIATRIC:  Normal affect  ASSESSMENT:    1. Atrial flutter, unspecified type (HCC)   2. NICM (nonischemic cardiomyopathy) (HCC)   3. Coronary artery disease involving native heart without angina pectoris, unspecified vessel or lesion type   4. Hyperlipidemia LDL goal <70   5. Bruit of right carotid artery     PLAN:    In order of problems listed above:  1.  PAF -s/p TEE 3/17 with LAA thrombus -s/p DCCV 11/2015, 5/14 2024 and a flutter ablation on 06/19/2023 -He is maintaining normal sinus rhythm on exam today -Continue prescription drug management with amiodarone 200 mg daily, Eliquis 5 mg twice daily and Lopressor 25 mg twice daily with as needed refills -He denies any bleeding issues on Eliquis -I have personally reviewed and interpreted outside labs  which showed serum creatinine 1.03 and hemoglobin 15.5 on 06/08/2023  2.  NIDCM -felt tachy related -no significant CAD on cath 2017 -echo with EF 60-65% 03/2019 -TEE 12/20/2022 showed EF 40 to 45% -Will repeat 2D echo to reassess LV function -Continue drug management with Lopressor 25 mg  twice daily  3.  ASCAD -mild nonobstructive CAD by cath 2017 -He denies any anginal symptoms -no ASA due to DOAC -Continue statin therapy  4.  HLD -LDL goal < 70 -I  have personally reviewed and interpreted outside labs performed by patient's PCP which showed LDL 68 and HDL 58 on 02/02/2023 -Continue prescription drug management with atorvastatin 40 mg daily with as needed refills  5.  Right Carotid bruit -check carotid doppler  Medication Adjustments/Labs and Tests Ordered: Current medicines are reviewed at length with the patient today.  Concerns regarding medicines are outlined above.  No orders of the defined types were placed in this encounter.  No orders of the defined types were placed in this encounter.   Signed, Armanda Magic, MD  06/22/2023 8:42 AM    [facility]

## 2023-06-22 NOTE — Patient Instructions (Signed)
Medication Instructions:  Your physician recommends that you continue on your current medications as directed. Please refer to the Current Medication list given to you today.  *If you need a refill on your cardiac medications before your next appointment, please call your pharmacy*   Lab Work: None.   If you have labs (blood work) drawn today and your tests are completely normal, you will receive your results only by: MyChart Message (if you have MyChart) OR A paper copy in the mail If you have any lab test that is abnormal or we need to change your treatment, we will call you to review the results.   Testing/Procedures: Your physician has requested that you have an echocardiogram. Echocardiography is a painless test that uses sound waves to create images of your heart. It provides your doctor with information about the size and shape of your heart and how well your heart's chambers and valves are working. This procedure takes approximately one hour. There are no restrictions for this procedure. Please do NOT wear cologne, perfume, aftershave, or lotions (deodorant is allowed). Please arrive 15 minutes prior to your appointment time.  Please note: We ask at that you not bring children with you during ultrasound (echo/ vascular) testing. Due to room size and safety concerns, children are not allowed in the ultrasound rooms during exams. Our front office staff cannot provide observation of children in our lobby area while testing is being conducted. An adult accompanying a patient to their appointment will only be allowed in the ultrasound room at the discretion of the ultrasound technician under special circumstances. We apologize for any inconvenience.  Your physician has requested that you have a carotid duplex. This test is an ultrasound of the carotid arteries in your neck. It looks at blood flow through these arteries that supply the brain with blood. Allow one hour for this exam. There are  no restrictions or special instructions.    Follow-Up: At [facility], you and your health needs are our priority.  As part of our continuing mission to provide you with exceptional heart care, we have created designated Provider Care Teams.  These Care Teams include your primary Cardiologist (physician) and Advanced Practice Providers (APPs -  Physician Assistants and Nurse Practitioners) who all work together to provide you with the care you need, when you need it.  We recommend signing up for the patient portal called "MyChart".  Sign up information is provided on this After Visit Summary.  MyChart is used to connect with patients for Virtual Visits (Telemedicine).  Patients are able to view lab/test results, encounter notes, upcoming appointments, etc.  Non-urgent messages can be sent to your provider as well.   To learn more about what you can do with MyChart, go to [email or web address].    Your next appointment:   1 year(s)  Provider:   Armanda Magic, MD

## 2023-06-22 NOTE — Addendum Note (Signed)
Addended by: Luellen Pucker on: 06/22/2023 08:47 AM   Modules accepted: Orders

## 2023-06-27 NOTE — Telephone Encounter (Signed)
Called pt left vm for pt to call back about monitor/app on the phone is not connected

## 2023-06-28 NOTE — Telephone Encounter (Signed)
 Pt is now transmitting.

## 2023-07-09 ENCOUNTER — Ambulatory Visit (HOSPITAL_COMMUNITY)
Admission: RE | Admit: 2023-07-09 | Discharge: 2023-07-09 | Disposition: A | Discharge status: Home / Self Care | Payer: BC Managed Care – PPO | Source: Ambulatory Visit | Attending: Cardiology | Admitting: Cardiology

## 2023-07-09 ENCOUNTER — Ambulatory Visit (HOSPITAL_BASED_OUTPATIENT_CLINIC_OR_DEPARTMENT_OTHER)
Admission: RE | Admit: 2023-07-09 | Discharge: 2023-07-09 | Disposition: A | Discharge status: Home / Self Care | Payer: BC Managed Care – PPO | Source: Ambulatory Visit | Attending: Cardiology | Admitting: Cardiology

## 2023-07-09 DIAGNOSIS — R0989 Other specified symptoms and signs involving the circulatory and respiratory systems: Secondary | ICD-10-CM

## 2023-07-09 DIAGNOSIS — I428 Other cardiomyopathies: Secondary | ICD-10-CM | POA: Insufficient documentation

## 2023-07-09 LAB — ECHOCARDIOGRAM COMPLETE
AR max vel: 2.03 cm2
AV Area VTI: 1.97 cm2
AV Area mean vel: 1.98 cm2
AV Mean grad: 2 mm[Hg]
AV Peak grad: 3.7 mm[Hg]
Ao pk vel: 0.96 m/s
Area-P 1/2: 4.39 cm2
S' Lateral: 3.5 cm

## 2023-07-10 ENCOUNTER — Telehealth: Payer: Self-pay

## 2023-07-10 ENCOUNTER — Encounter: Payer: Self-pay | Admitting: Cardiology

## 2023-07-10 DIAGNOSIS — I6529 Occlusion and stenosis of unspecified carotid artery: Secondary | ICD-10-CM | POA: Insufficient documentation

## 2023-07-10 DIAGNOSIS — I6522 Occlusion and stenosis of left carotid artery: Secondary | ICD-10-CM

## 2023-07-10 NOTE — Telephone Encounter (Signed)
Call to patient to advise of carotid scan results. Per Dr. Mayford Knife, 1-39% right ICA stenosis and occluded left ICA - flow disturbed in right subclavian. Patient denies any right hand or hand pain with movement, numbness, tingling or coolness of right arm or hand. Dr. Mayford Knife recommends referral to Dr. Chestine Spore with vascular surgery. Patient verbalizes understanding, referral placed.

## 2023-07-10 NOTE — Telephone Encounter (Signed)
-----   Message from Armanda Magic sent at 07/10/2023  4:12 AM EST ----- 1-39% right ICA stenosis and occluded left ICA - flow disturbed in right subclavian.  Please find out if he has had an right had or hard pain with movement, numbness, tingling or coolness of right arm or hand.  No change in meds

## 2023-07-16 NOTE — Progress Notes (Unsigned)
  Electrophysiology Office Note:   Date:  07/16/2023  ID:  Adrian [name], DOB [birth year], MRN [id number]  Primary Cardiologist: Armanda Magic, MD Electrophysiologist: Lanier Prude, MD  {Click to update primary MD,subspecialty MD or APP then REFRESH:1}    History of Present Illness:   Adrian [name] is a 67 y.o. male with h/o NICM, AFL, Non-obstructive CAD, AFL on eliquis, and diverticulitis seen today for routine electrophysiology followup.   Underwent CTI ablation and ILR implant  Since last being seen in our clinic the patient reports doing ***.  he denies chest pain, palpitations, dyspnea, PND, orthopnea, nausea, vomiting, dizziness, syncope, edema, weight gain, or early satiety.   Review of systems complete and found to be negative unless listed in HPI.   EP Information / Studies Reviewed:    EKG is ordered today. Personal review as below.       Device history [place] Loop 06/19/2023 s/p Flutter ablation to follow burdens  Arrhythmia History  S/p AFL ablation 06/19/2023  Dec 20, 2022 transesophageal echo EF 40 to 45% RV mildly reduced No left atrial appendage thrombus No significant valvular abnormalities   Dec 19, 2022 EKG shows atrial flutter with 2 1 AV conduction   October 15, 2015 EKG shows typical appearing atrial flutter with variable AV conduction  Physical Exam:   VS:  There were no vitals taken for this visit.   Wt Readings from Last 3 Encounters:  06/22/23 140 lb (63.5 kg)  06/19/23 137 lb (62.1 kg)  06/08/23 136 lb (61.7 kg)     GEN: Well nourished, well developed in no acute distress NECK: No JVD; No carotid bruits CARDIAC: {EPRHYTHM:28826}, no murmurs, rubs, gallops RESPIRATORY:  Clear to auscultation without rales, wheezing or rhonchi  ABDOMEN: Soft, non-tender, non-distended EXTREMITIES:  No edema; No deformity   ASSESSMENT AND PLAN:    Persistent atrial flutter EKG today shows *** Will stop amiodarone ***  NICM Chronic systolic CHF NYHA  *** symptoms Warm and dry on exam GDMT per primary team.   {Click here to Review PMH, Prob List, Meds, Allergies, SHx, FHx  :1}   Follow up with {OBSJG:28366} {EPFOLLOW QH:47654}  Signed, Graciella Freer, PA-C

## 2023-07-17 ENCOUNTER — Ambulatory Visit: Payer: BC Managed Care – PPO | Attending: Student | Admitting: Student

## 2023-07-17 ENCOUNTER — Encounter: Payer: Self-pay | Admitting: Student

## 2023-07-17 ENCOUNTER — Other Ambulatory Visit: Payer: Self-pay

## 2023-07-17 VITALS — BP 162/90 | HR 57 | Ht 68.0 in | Wt 143.2 lb

## 2023-07-17 DIAGNOSIS — I428 Other cardiomyopathies: Secondary | ICD-10-CM | POA: Diagnosis not present

## 2023-07-17 DIAGNOSIS — I10 Essential (primary) hypertension: Secondary | ICD-10-CM | POA: Diagnosis not present

## 2023-07-17 DIAGNOSIS — I4892 Unspecified atrial flutter: Secondary | ICD-10-CM | POA: Diagnosis not present

## 2023-07-17 DIAGNOSIS — I2510 Atherosclerotic heart disease of native coronary artery without angina pectoris: Secondary | ICD-10-CM

## 2023-07-17 MED ORDER — METOPROLOL TARTRATE 50 MG PO TABS: 50.0000 mg | ORAL_TABLET | Freq: Two times a day (BID) | ORAL | 3 refills | Status: DC

## 2023-07-17 NOTE — Patient Instructions (Signed)
Medication Instructions:  1.Stop amiodarone 2.Increase metoprolol tartrate to 50 mg twice daily *If you need a refill on your cardiac medications before your next appointment, please call your pharmacy*  Lab Work: None ordered If you have labs (blood work) drawn today and your tests are completely normal, you will receive your results only by: MyChart Message (if you have MyChart) OR A paper copy in the mail If you have any lab test that is abnormal or we need to change your treatment, we will call you to review the results.  Follow-Up: At [facility], you and your health needs are our priority.  As part of our continuing mission to provide you with exceptional heart care, we have created designated Provider Care Teams.  These Care Teams include your primary Cardiologist (physician) and Advanced Practice Providers (APPs -  Physician Assistants and Nurse Practitioners) who all work together to provide you with the care you need, when you need it.  We recommend signing up for the patient portal called "MyChart".  Sign up information is provided on this After Visit Summary.  MyChart is used to connect with patients for Virtual Visits (Telemedicine).  Patients are able to view lab/test results, encounter notes, upcoming appointments, etc.  Non-urgent messages can be sent to your provider as well.   To learn more about what you can do with MyChart, go to [email or web address].    Your next appointment:   3-4 month(s)  Provider:   Steffanie Dunn, MD

## 2023-07-23 ENCOUNTER — Ambulatory Visit (INDEPENDENT_AMBULATORY_CARE_PROVIDER_SITE_OTHER): Payer: BC Managed Care – PPO

## 2023-07-23 DIAGNOSIS — I4892 Unspecified atrial flutter: Secondary | ICD-10-CM

## 2023-07-25 ENCOUNTER — Telehealth: Payer: Self-pay | Admitting: Cardiology

## 2023-07-25 NOTE — Telephone Encounter (Signed)
Patient called the answering service this evening concerned that his loop recorder was having issues staying connected to [employer]. Reports that about 5 weeks ago, he was called and told that his device was not connected and was not transmitting. The issues got resolved at that time. Today, he again got a message that his loop recorder was disconnected. He asked if this could have anything to do with the fact that he does not have Wifi at his home. To the best of his knowledge, he has not had any connection issues for the past 5 weeks.   I discussed that I do not work much with Loop recorders and transmissions. I told patient that I would send a message to our office, and ask someone to contact him tomorrow for further advice. Patient voiced understanding and thanked me for my call back   Jonita Albee, PA-C 07/25/2023 7:02 PM

## 2023-07-26 LAB — CUP PACEART REMOTE DEVICE CHECK
Date Time Interrogation Session: 20241218135600
Implantable Pulse Generator Implant Date: 20241112
Pulse Gen Serial Number: 126890

## 2023-07-27 ENCOUNTER — Telehealth: Payer: Self-pay

## 2023-07-27 NOTE — Telephone Encounter (Signed)
I spoke with the pt letting him know that he do not need wifi for his app to work. It goes off the cell towers. He states he do get good cell signal at home.

## 2023-07-27 NOTE — Telephone Encounter (Signed)
Call to patient to discuss echo results. Spoke with spouse [name]) and advised that EF has improved. Okey Regal verbalized understanding.

## 2023-07-27 NOTE — Telephone Encounter (Signed)
-----   Message from Christell Constant sent at 07/09/2023 11:27 AM EST ----- Function has improved with Dr. Norris Cross Therapy; continue ----- Message ----- From: Interface, [employer] Sent: 07/09/2023  10:58 AM EST To: Quintella Reichert, MD

## 2023-08-11 ENCOUNTER — Emergency Department (HOSPITAL_COMMUNITY): Payer: Medicare Other

## 2023-08-11 ENCOUNTER — Inpatient Hospital Stay (HOSPITAL_COMMUNITY): Payer: Medicare Other

## 2023-08-11 ENCOUNTER — Other Ambulatory Visit: Payer: Self-pay

## 2023-08-11 ENCOUNTER — Inpatient Hospital Stay (HOSPITAL_COMMUNITY)
Admission: EM | Admit: 2023-08-11 | Discharge: 2023-08-14 | DRG: 065 | Disposition: A | Discharge status: Rehabilitation Facility | Payer: Medicare Other | Attending: Internal Medicine | Admitting: Internal Medicine

## 2023-08-11 DIAGNOSIS — I480 Paroxysmal atrial fibrillation: Secondary | ICD-10-CM | POA: Diagnosis present

## 2023-08-11 DIAGNOSIS — R29810 Facial weakness: Secondary | ICD-10-CM | POA: Diagnosis present

## 2023-08-11 DIAGNOSIS — Z720 Tobacco use: Secondary | ICD-10-CM | POA: Diagnosis not present

## 2023-08-11 DIAGNOSIS — Z808 Family history of malignant neoplasm of other organs or systems: Secondary | ICD-10-CM

## 2023-08-11 DIAGNOSIS — R001 Bradycardia, unspecified: Secondary | ICD-10-CM | POA: Diagnosis not present

## 2023-08-11 DIAGNOSIS — R29708 NIHSS score 8: Secondary | ICD-10-CM | POA: Diagnosis present

## 2023-08-11 DIAGNOSIS — I482 Chronic atrial fibrillation, unspecified: Secondary | ICD-10-CM | POA: Diagnosis not present

## 2023-08-11 DIAGNOSIS — I69393 Ataxia following cerebral infarction: Secondary | ICD-10-CM | POA: Diagnosis not present

## 2023-08-11 DIAGNOSIS — R471 Dysarthria and anarthria: Secondary | ICD-10-CM | POA: Diagnosis present

## 2023-08-11 DIAGNOSIS — G8191 Hemiplegia, unspecified affecting right dominant side: Secondary | ICD-10-CM | POA: Diagnosis present

## 2023-08-11 DIAGNOSIS — Z716 Tobacco abuse counseling: Secondary | ICD-10-CM | POA: Diagnosis not present

## 2023-08-11 DIAGNOSIS — D696 Thrombocytopenia, unspecified: Secondary | ICD-10-CM | POA: Diagnosis present

## 2023-08-11 DIAGNOSIS — Z79899 Other long term (current) drug therapy: Secondary | ICD-10-CM

## 2023-08-11 DIAGNOSIS — Z9221 Personal history of antineoplastic chemotherapy: Secondary | ICD-10-CM

## 2023-08-11 DIAGNOSIS — I161 Hypertensive emergency: Secondary | ICD-10-CM | POA: Diagnosis present

## 2023-08-11 DIAGNOSIS — Z8042 Family history of malignant neoplasm of prostate: Secondary | ICD-10-CM

## 2023-08-11 DIAGNOSIS — D72819 Decreased white blood cell count, unspecified: Secondary | ICD-10-CM | POA: Diagnosis present

## 2023-08-11 DIAGNOSIS — E785 Hyperlipidemia, unspecified: Secondary | ICD-10-CM | POA: Diagnosis present

## 2023-08-11 DIAGNOSIS — I708 Atherosclerosis of other arteries: Secondary | ICD-10-CM | POA: Diagnosis present

## 2023-08-11 DIAGNOSIS — I619 Nontraumatic intracerebral hemorrhage, unspecified: Secondary | ICD-10-CM | POA: Diagnosis present

## 2023-08-11 DIAGNOSIS — E039 Hypothyroidism, unspecified: Secondary | ICD-10-CM | POA: Diagnosis present

## 2023-08-11 DIAGNOSIS — I428 Other cardiomyopathies: Secondary | ICD-10-CM | POA: Diagnosis present

## 2023-08-11 DIAGNOSIS — I69292 Facial weakness following other nontraumatic intracranial hemorrhage: Secondary | ICD-10-CM | POA: Diagnosis not present

## 2023-08-11 DIAGNOSIS — I2510 Atherosclerotic heart disease of native coronary artery without angina pectoris: Secondary | ICD-10-CM | POA: Diagnosis present

## 2023-08-11 DIAGNOSIS — I615 Nontraumatic intracerebral hemorrhage, intraventricular: Secondary | ICD-10-CM | POA: Diagnosis present

## 2023-08-11 DIAGNOSIS — I10 Essential (primary) hypertension: Secondary | ICD-10-CM | POA: Diagnosis present

## 2023-08-11 DIAGNOSIS — Z85818 Personal history of malignant neoplasm of other sites of lip, oral cavity, and pharynx: Secondary | ICD-10-CM | POA: Diagnosis not present

## 2023-08-11 DIAGNOSIS — I69222 Dysarthria following other nontraumatic intracranial hemorrhage: Secondary | ICD-10-CM | POA: Diagnosis not present

## 2023-08-11 DIAGNOSIS — Z79890 Hormone replacement therapy (postmenopausal): Secondary | ICD-10-CM

## 2023-08-11 DIAGNOSIS — Z7901 Long term (current) use of anticoagulants: Secondary | ICD-10-CM

## 2023-08-11 DIAGNOSIS — Z800 Family history of malignant neoplasm of digestive organs: Secondary | ICD-10-CM

## 2023-08-11 DIAGNOSIS — I610 Nontraumatic intracerebral hemorrhage in hemisphere, subcortical: Secondary | ICD-10-CM | POA: Diagnosis present

## 2023-08-11 DIAGNOSIS — I69320 Aphasia following cerebral infarction: Secondary | ICD-10-CM | POA: Diagnosis not present

## 2023-08-11 DIAGNOSIS — I69251 Hemiplegia and hemiparesis following other nontraumatic intracranial hemorrhage affecting right dominant side: Secondary | ICD-10-CM | POA: Diagnosis not present

## 2023-08-11 DIAGNOSIS — F17210 Nicotine dependence, cigarettes, uncomplicated: Secondary | ICD-10-CM | POA: Diagnosis present

## 2023-08-11 DIAGNOSIS — Z981 Arthrodesis status: Secondary | ICD-10-CM

## 2023-08-11 LAB — CBC
HCT: 41.7 % (ref 39.0–52.0)
Hemoglobin: 14.4 g/dL (ref 13.0–17.0)
MCH: 34.4 pg — ABNORMAL HIGH (ref 26.0–34.0)
MCHC: 34.5 g/dL (ref 30.0–36.0)
MCV: 99.5 fL (ref 80.0–100.0)
Platelets: 132 10*3/uL — ABNORMAL LOW (ref 150–400)
RBC: 4.19 MIL/uL — ABNORMAL LOW (ref 4.22–5.81)
RDW: 12.8 % (ref 11.5–15.5)
WBC: 2.3 10*3/uL — ABNORMAL LOW (ref 4.0–10.5)
nRBC: 0 % (ref 0.0–0.2)

## 2023-08-11 LAB — COMPREHENSIVE METABOLIC PANEL
ALT: 23 U/L (ref 0–44)
AST: 25 U/L (ref 15–41)
Albumin: 3.5 g/dL (ref 3.5–5.0)
Alkaline Phosphatase: 56 U/L (ref 38–126)
Anion gap: 11 (ref 5–15)
BUN: 16 mg/dL (ref 8–23)
CO2: 23 mmol/L (ref 22–32)
Calcium: 9 mg/dL (ref 8.9–10.3)
Chloride: 98 mmol/L (ref 98–111)
Creatinine, Ser: 0.86 mg/dL (ref 0.61–1.24)
GFR, Estimated: >60 mL/min (ref >60)
Glucose, Bld: 102 mg/dL — ABNORMAL HIGH (ref 70–99)
Potassium: 4.4 mmol/L (ref 3.5–5.1)
Sodium: 132 mmol/L — ABNORMAL LOW (ref 135–145)
Total Bilirubin: 0.8 mg/dL (ref 0.0–1.2)
Total Protein: 6.6 g/dL (ref 6.5–8.1)

## 2023-08-11 LAB — I-STAT CHEM 8, ED
BUN: 23 mg/dL (ref 8–23)
Calcium, Ion: 1.07 mmol/L — ABNORMAL LOW (ref 1.15–1.40)
Chloride: 103 mmol/L (ref 98–111)
Creatinine, Ser: 0.7 mg/dL (ref 0.61–1.24)
Glucose, Bld: 97 mg/dL (ref 70–99)
HCT: 49 % (ref 39.0–52.0)
Hemoglobin: 16.7 g/dL (ref 13.0–17.0)
Potassium: 5.1 mmol/L (ref 3.5–5.1)
Sodium: 135 mmol/L (ref 135–145)
TCO2: 25 mmol/L (ref 22–32)

## 2023-08-11 LAB — DIFFERENTIAL
Abs Immature Granulocytes: 0.01 10*3/uL (ref 0.00–0.07)
Basophils Absolute: 0 10*3/uL (ref 0.0–0.1)
Basophils Relative: 1 %
Eosinophils Absolute: 0 10*3/uL (ref 0.0–0.5)
Eosinophils Relative: 2 %
Immature Granulocytes: 0 %
Lymphocytes Relative: 41 %
Lymphs Abs: 1 10*3/uL (ref 0.7–4.0)
Monocytes Absolute: 0.8 10*3/uL (ref 0.1–1.0)
Monocytes Relative: 34 %
Neutro Abs: 0.5 10*3/uL — ABNORMAL LOW (ref 1.7–7.7)
Neutrophils Relative %: 22 %
Smear Review: DECREASED

## 2023-08-11 LAB — MRSA NEXT GEN BY PCR, NASAL: MRSA by PCR Next Gen: NOT DETECTED

## 2023-08-11 LAB — ETHANOL: Alcohol, Ethyl (B): <10 mg/dL (ref <10)

## 2023-08-11 LAB — PROTIME-INR
INR: 1.1 (ref 0.8–1.2)
Prothrombin Time: 14.6 s (ref 11.4–15.2)

## 2023-08-11 LAB — APTT: aPTT: 30 s (ref 24–36)

## 2023-08-11 LAB — CBG MONITORING, ED: Glucose-Capillary: 105 mg/dL — ABNORMAL HIGH (ref 70–99)

## 2023-08-11 MED ORDER — EMPTY CONTAINERS FLEXIBLE MISC
900.0000 mg | Freq: Once | Status: AC
  Administered 2023-08-11: 900 mg via INTRAVENOUS
  Filled 2023-08-11: qty 90

## 2023-08-11 MED ORDER — GADOBUTROL 1 MMOL/ML IV SOLN
6.5000 mL | Freq: Once | INTRAVENOUS | Status: AC | PRN
  Administered 2023-08-11: 6.5 mL via INTRAVENOUS

## 2023-08-11 MED ORDER — ACETAMINOPHEN 325 MG PO TABS
650.0000 mg | ORAL_TABLET | ORAL | Status: DC | PRN
  Administered 2023-08-11 – 2023-08-13 (×2): 650 mg via ORAL
  Filled 2023-08-11 (×2): qty 2

## 2023-08-11 MED ORDER — LABETALOL HCL 5 MG/ML IV SOLN: 20.0000 mg | Freq: Once | INTRAVENOUS | Status: DC

## 2023-08-11 MED ORDER — HYDRALAZINE HCL 20 MG/ML IJ SOLN
20.0000 mg | Freq: Once | INTRAMUSCULAR | Status: AC
  Administered 2023-08-11: 20 mg via INTRAVENOUS

## 2023-08-11 MED ORDER — ORAL CARE MOUTH RINSE
15.0000 mL | OROMUCOSAL | Status: DC | PRN
Start: 2023-08-11 — End: 2023-08-14

## 2023-08-11 MED ORDER — PANTOPRAZOLE SODIUM 40 MG IV SOLR: 40.0000 mg | Freq: Every day | INTRAVENOUS | Status: DC

## 2023-08-11 MED ORDER — PANTOPRAZOLE SODIUM 40 MG PO TBEC
40.0000 mg | DELAYED_RELEASE_TABLET | Freq: Every day | ORAL | Status: DC
  Administered 2023-08-11: 40 mg via ORAL
  Filled 2023-08-11: qty 1

## 2023-08-11 MED ORDER — ACETAMINOPHEN 650 MG RE SUPP: 650.0000 mg | RECTAL | Status: DC | PRN

## 2023-08-11 MED ORDER — SENNOSIDES-DOCUSATE SODIUM 8.6-50 MG PO TABS
1.0000 | ORAL_TABLET | Freq: Two times a day (BID) | ORAL | Status: DC
  Administered 2023-08-11 – 2023-08-13 (×4): 1 via ORAL
  Filled 2023-08-11 (×6): qty 1

## 2023-08-11 MED ORDER — IOHEXOL 350 MG/ML SOLN
75.0000 mL | Freq: Once | INTRAVENOUS | Status: AC | PRN
  Administered 2023-08-11: 75 mL via INTRAVENOUS

## 2023-08-11 MED ORDER — ACETAMINOPHEN 160 MG/5ML PO SOLN: 650.0000 mg | ORAL | Status: DC | PRN

## 2023-08-11 MED ORDER — HYDRALAZINE HCL 20 MG/ML IJ SOLN
INTRAMUSCULAR | Status: AC
Start: 2023-08-11 — End: ?
  Filled 2023-08-11: qty 1

## 2023-08-11 MED ORDER — CLEVIDIPINE BUTYRATE 0.5 MG/ML IV EMUL
0.0000 mg/h | INTRAVENOUS | Status: DC
  Administered 2023-08-11: 4 mg/h via INTRAVENOUS
  Administered 2023-08-11: 2 mg/h via INTRAVENOUS
  Filled 2023-08-11: qty 50
  Filled 2023-08-11: qty 100

## 2023-08-11 MED ORDER — SODIUM CHLORIDE 0.9% FLUSH: 3.0000 mL | Freq: Once | INTRAVENOUS | Status: DC

## 2023-08-11 MED ORDER — STROKE: EARLY STAGES OF RECOVERY BOOK
Freq: Once | Status: AC
  Filled 2023-08-11: qty 1

## 2023-08-11 MED ORDER — CHLORHEXIDINE GLUCONATE CLOTH 2 % EX PADS
6.0000 | MEDICATED_PAD | Freq: Every day | CUTANEOUS | Status: DC
  Administered 2023-08-11 – 2023-08-14 (×4): 6 via TOPICAL

## 2023-08-11 NOTE — ED Provider Notes (Signed)
 [place] EMERGENCY DEPARTMENT AT [facility] Provider Note   CSN: [id number] Arrival date & time: 08/11/23  1414     History  No chief complaint on file.   Adrian [name] is a 68 y.o. male.  He is presenting as a code stroke activation.  Last known well was 11 AM.  Complaining of right face right arm right leg weakness.  He is on Eliquis  for atrial flutter and last took a dose of Eliquis  last evening.  Denies any headache.  No numbness.  The history is provided by the patient and the EMS personnel.  Cerebrovascular Accident This is a new problem. The current episode started 3 to 5 hours ago. The problem occurs constantly. The problem has not changed since onset.Pertinent negatives include no chest pain, no abdominal pain, no headaches and no shortness of breath. Nothing aggravates the symptoms. Nothing relieves the symptoms. He has tried rest for the symptoms. The treatment provided no relief.       Home Medications Prior to Admission medications   Medication Sig Start Date End Date Taking? Authorizing Provider  apixaban  (ELIQUIS ) 5 MG TABS tablet Take 1 tablet (5 mg total) by mouth 2 (two) times daily. 05/07/23   Burchette, Wolm ORN, MD  atorvastatin  (LIPITOR ) 40 MG tablet TAKE 1 TABLET BY MOUTH EVERY DAY 06/12/23   Burchette, Wolm ORN, MD  ibuprofen (ADVIL) 200 MG tablet Take 200-400 mg by mouth daily as needed (pain.).    [provider]  levothyroxine  (SYNTHROID ) 125 MCG tablet TAKE 1 TABLET BY MOUTH EVERY DAY Patient taking differently: Take 112 mcg by mouth daily. 06/12/23   Burchette, Wolm ORN, MD  metoprolol  tartrate (LOPRESSOR ) 50 MG tablet Take 1 tablet (50 mg total) by mouth 2 (two) times daily. 07/17/23   Lesia Ozell Barter, PA-C      Allergies    Patient has no known allergies.    Review of Systems   Review of Systems  Constitutional:  Negative for fever.  HENT:  Negative for sore throat.   Eyes:  Negative for visual disturbance.  Respiratory:   Negative for shortness of breath.   Cardiovascular:  Negative for chest pain.  Gastrointestinal:  Negative for abdominal pain.  Genitourinary:  Negative for dysuria.  Skin:  Negative for rash.  Neurological:  Positive for weakness. Negative for headaches.    Physical Exam Updated Vital Signs BP 102/80 Comment: clevi titrated  Pulse 70   Temp 97.8 F (36.6 C) (Oral)   Resp 17   Ht 5' 8 (1.727 m)   Wt 63.5 kg   SpO2 94%   BMI 21.29 kg/m  Physical Exam Vitals and nursing note reviewed.  Constitutional:      General: He is not in acute distress.    Appearance: Normal appearance. He is well-developed.  HENT:     Head: Normocephalic and atraumatic.  Eyes:     Conjunctiva/sclera: Conjunctivae normal.  Cardiovascular:     Rate and Rhythm: Normal rate and regular rhythm.     Heart sounds: No murmur heard. Pulmonary:     Effort: Pulmonary effort is normal. No respiratory distress.     Breath sounds: Normal breath sounds.  Abdominal:     Palpations: Abdomen is soft.     Tenderness: There is no abdominal tenderness.  Musculoskeletal:        General: No swelling.     Cervical back: Neck supple.  Skin:    General: Skin is warm and dry.  Capillary Refill: Capillary refill takes less than 2 seconds.  Neurological:     Mental Status: He is alert and oriented to person, place, and time.     Sensory: No sensory deficit.     Motor: Weakness present.     Comments: He has right facial droop and right arm and leg weakness.  No gross sensory deficit.     ED Results / Procedures / Treatments   Labs (all labs ordered are listed, but only abnormal results are displayed) Labs Reviewed  CBC - Abnormal; Notable for the following components:      Result Value   WBC 2.3 (*)    RBC 4.19 (*)    MCH 34.4 (*)    Platelets 132 (*)    All other components within normal limits  DIFFERENTIAL - Abnormal; Notable for the following components:   Neutro Abs 0.5 (*)    All other components  within normal limits  COMPREHENSIVE METABOLIC PANEL - Abnormal; Notable for the following components:   Sodium 132 (*)    Glucose, Bld 102 (*)    All other components within normal limits  I-STAT CHEM 8, ED - Abnormal; Notable for the following components:   Calcium , Ion 1.07 (*)    All other components within normal limits  CBG MONITORING, ED - Abnormal; Notable for the following components:   Glucose-Capillary 105 (*)    All other components within normal limits  MRSA NEXT GEN BY PCR, NASAL  PROTIME-INR  APTT  ETHANOL  PATHOLOGIST SMEAR REVIEW    EKG EKG Interpretation Date/Time:  Saturday August 11 2023 14:54:31 EST Ventricular Rate:  68 PR Interval:  192 QRS Duration:  110 QT Interval:  434 QTC Calculation: 462 R Axis:   -77  Text Interpretation: Sinus rhythm Left anterior fascicular block Borderline repolarization abnormality No significant change since prior 12/24 Confirmed by Towana Sharper [phone]) on 08/11/2023 2:56:11 PM  Radiology CT ANGIO HEAD NECK W WO CM (CODE STROKE) Result Date: 08/11/2023 CLINICAL DATA:  Neuro deficit, acute, stroke suspected. Acute right-sided weakness. EXAM: CT ANGIOGRAPHY HEAD AND NECK WITH AND WITHOUT CONTRAST TECHNIQUE: Multidetector CT imaging of the head and neck was performed using the standard protocol during bolus administration of intravenous contrast. Multiplanar CT image reconstructions and MIPs were obtained to evaluate the vascular anatomy. Carotid stenosis measurements (when applicable) are obtained utilizing NASCET criteria, using the distal internal carotid diameter as the denominator. RADIATION DOSE REDUCTION: This exam was performed according to the departmental dose-optimization program which includes automated exposure control, adjustment of the mA and/or kV according to patient size and/or use of iterative reconstruction technique. CONTRAST:  75mL OMNIPAQUE  IOHEXOL  350 MG/ML SOLN COMPARISON:  CTA head and neck 03/08/2019 FINDINGS:  CTA NECK FINDINGS Aortic arch: Normal variant 4 vessel aortic arch with the left vertebral artery arising directly from the arch. No significant stenosis of the arch vessel origins. Right carotid system: Patent with a small amount of calcified plaque at the carotid bifurcation and in the proximal ICA. No evidence of a significant stenosis or dissection. Left carotid system: The common carotid artery is patent. There is prominent calcified and soft plaque at the carotid bifurcation with chronic occlusion of the ICA at the bulb without reconstitution in the neck. Vertebral arteries: Patent and codominant without evidence of a significant stenosis or dissection. Skeleton: Solid C5-6 ACDF. Advanced disc degeneration at C3-4 and C6-7 with asymmetric right neural foraminal stenosis at these levels. Asymmetrically advanced left facet arthrosis in the upper cervical  spine. Other neck: No evidence of cervical lymphadenopathy or mass. Upper chest: Biapical pleuroparenchymal lung scarring. Review of the MIP images confirms the above findings CTA HEAD FINDINGS Anterior circulation: The intracranial right ICA is patent with mild atherosclerosis not resulting in significant stenosis. There is reconstitution of the left ICA beginning in the horizontal petrous segment with the vessel remaining grossly patent though irregularly narrowed distally through the terminus, with a particularly thready appearance in the distal petrous and proximal cavernous segments. ACAs and MCAs are patent without evidence of a significant A1 or M1 stenosis. There is attenuation of left MCA branch vessels in the setting of a chronic infarct. Two adjacent punctate foci of contrast are present in the medial aspect of the acute left basal ganglia hemorrhage suggestive of contrast extravasation and possible ongoing bleeding. No aneurysm or vascular malformation is identified. Posterior circulation: The intracranial vertebral arteries are widely patent to the  basilar. The basilar artery is widely patent. Posterior communicating arteries are diminutive or absent. The PCAs are patent with the right PCA appearing mildly smaller than the left diffusely but without evidence of a flow limiting proximal stenosis. No aneurysm is identified. Venous sinuses: Patent. Anatomic variants: None. Review of the MIP images confirms the above findings These results were communicated to Dr. Vanessa at 2:54 pm on 08/11/2023 by text page via the [facility] messaging system. IMPRESSION: 1. CTA spot sign/punctate foci of contrast extravasation within the left basal ganglia hematoma. 2. No aneurysm, vascular malformation, or acute large vessel occlusion identified. 3. 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 Electronically Signed   By: Dasie Hamburg M.D.   On: 08/11/2023 15:08   CT HEAD CODE STROKE WO CONTRAST Result Date: 08/11/2023 CLINICAL DATA:  Code stroke.  Neuro deficit, acute, stroke suspected EXAM: CT HEAD WITHOUT CONTRAST TECHNIQUE: Contiguous axial images were obtained from the base of the skull through the vertex without intravenous contrast. RADIATION DOSE REDUCTION: This exam was performed according to the departmental dose-optimization program which includes automated exposure control, adjustment of the mA and/or kV according to patient size and/or use of iterative reconstruction technique. COMPARISON:  Head CT 06/08/2023 FINDINGS: Brain: 1.8 x 2.0 x 1.1 cm parenchymal hemorrhage centered in the left basal ganglia with intraventricular extension. There are layering blood products in the left occipital horn. No evidence of hydrocephalus at the time of exam. No significant mass effect or midline shift. There is chronic infarct involving the left frontal lobe and insular region. No extra-axial fluid collection. Vascular: No hyperdense vessel or unexpected calcification. Skull: Normal. Negative for fracture or focal lesion. Sinuses/Orbits: No middle  ear or mastoid effusion. Paranasal sinuses are notable for mucosal thickening in the right frontal and anterior ethmoid air cells. Orbits are unremarkable. Other: None ASPECTS ([place] Stroke Program Early CT Score) IMPRESSION: 1.8 x 2.0 x 1.1 cm parenchymal hemorrhage centered in the left basal ganglia with intraventricular extension. No evidence of hydrocephalus at the time of exam. No significant midline shift. Findings were paged to Dr. Vanessa on 08/11/23 at 2:29 PM Electronically Signed   By: Lyndall Gore M.D.   On: 08/11/2023 14:31    Procedures .Critical Care  Performed by: Towana Ozell BROCKS, MD Authorized by: Towana Ozell BROCKS, MD   Critical care provider statement:    Critical care time (minutes):  45   Critical care time was exclusive of:  Separately billable procedures and treating other patients   Critical care was necessary to treat or prevent imminent or  life-threatening deterioration of the following conditions:  CNS failure or compromise   Critical care was time spent personally by me on the following activities:  Development of treatment plan with patient or surrogate, discussions with consultants, evaluation of patient's response to treatment, examination of patient, obtaining history from patient or surrogate, ordering and performing treatments and interventions, ordering and review of laboratory studies, ordering and review of radiographic studies, pulse oximetry, re-evaluation of patient's condition and review of old charts   I assumed direction of critical care for this patient from another provider in my specialty: no       Medications Ordered in ED Medications  sodium chloride  flush (NS) 0.9 % injection 3 mL (has no administration in time range)  coag fact Xa recombinant (ANDEXXA ) low dose infusion 900 mg (has no administration in time range)  clevidipine  (CLEVIPREX ) infusion 0.5 mg/mL (has no administration in time range)   stroke: early stages of recovery book (has  no administration in time range)  acetaminophen  (TYLENOL ) tablet 650 mg (has no administration in time range)    Or  acetaminophen  (TYLENOL ) 160 MG/5ML solution 650 mg (has no administration in time range)    Or  acetaminophen  (TYLENOL ) suppository 650 mg (has no administration in time range)  senna-docusate (Senokot-S) tablet 1 tablet (has no administration in time range)  pantoprazole  (PROTONIX ) injection 40 mg (has no administration in time range)  hydrALAZINE  (APRESOLINE ) injection 20 mg (has no administration in time range)    ED Course/ Medical Decision Making/ A&P Clinical Course as of 08/11/23 1432  Sat Aug 11, 2023  1427 Initial CT head shows intracranial hemorrhage.  Neurology expediting reversal of anticoagulation and control of blood pressure. [MB]  1431 Neurology will be admitting patient.  Hydralazine  being given for blood pressure. [MB]    Clinical Course User Index [MB] Towana Ozell BROCKS, MD                                 Medical Decision Making Amount and/or Complexity of Data Reviewed Labs: ordered. Radiology: ordered.  Risk Decision regarding hospitalization.   This patient complains of acute onset right-sided weakness; this involves an extensive number of treatment Options and is a complaint that carries with it a high risk of complications and morbidity. The differential includes stroke, bleed, tumor, mass, seizure  I ordered, reviewed and interpreted labs, which included CBC with low white count stable hemoglobin, chemistries fairly unremarkable, glucose normal I ordered medication medications for reversal of anticoagulation and blood pressure control and reviewed PMP when indicated. I ordered imaging studies which included CT head and I independently    visualized and interpreted imaging which showed basal ganglia intraparenchymal hemorrhage Additional history obtained from EMS Previous records obtained and reviewed in epic including recent cardiology  notes I consulted neurology Dr. Dickson And discussed lab and imaging findings and discussed disposition.  Cardiac monitoring reviewed, sinus rhythm Social determinants considered, tobacco use Critical Interventions: Reversal of anticoagulation, acute stroke evaluation, control of elevated blood pressure  After the interventions stated above, I reevaluated the patient and found patient still with significant right-sided deficits Admission and further testing considered, patient will be admitted to neurology service ICU for continued blood pressure control and further management.         Final Clinical Impression(s) / ED Diagnoses Final diagnoses:  Intraparenchymal hemorrhage of brain [facility])  Hypertensive emergency    Rx / DC Orders ED Discharge  Orders     None         Towana Ozell BROCKS, MD 08/11/23 [id number]

## 2023-08-11 NOTE — H&P (Addendum)
 NEUROLOGY CONSULT NOTE   Date of service: August 11, 2023 Patient Name: Adrian [name] MRN:  [id number] DOB:  18-Jul-1956 Chief Complaint: R sided weakness Requesting Provider: No att. providers found  History of Present Illness  Adrian [name] is a 68 y.o. male with hx of Afibb on eliquis  and last dose was yesterday evening, carotid artery stenosis, hypothyroidism, who presents with sudden onset right-sided weakness.  He woke up this morning and made his coffee 11 AM, he noted abrupt onset of right-sided weakness.  Symptoms did not improve so he eventually called EMS and was brought in as a code stroke.  He was hypertensive to 230 systolic per EMS  CT head w/o contrast with a L BG ICH with IV extension.  LKW: 1100 AM Modified rankin score: 0-Completely asymptomatic and back to baseline post- stroke IV Thrombolysis: not offered 2/2 ICH EVT: not offered 2/2 ICH ICH Score:1  NIHSS components Score: Comment  1a Level of Conscious 0[x]  1[]  2[]  3[]      1b LOC Questions 0[x]  1[]  2[]       1c LOC Commands 0[x]  1[]  2[]       2 Best Gaze 0[x]  1[]  2[]       3 Visual 0[x]  1[]  2[]  3[]      4 Facial Palsy 0[]  1[x]  2[]  3[]      5a Motor Arm - left 0[x]  1[]  2[]  3[]  4[]  UN[]    5b Motor Arm - Right 0[]  1[]  2[]  3[x]  4[]  UN[]    6a Motor Leg - Left 0[x]  1[]  2[]  3[]  4[]  UN[]    6b Motor Leg - Right 0[]  1[]  2[x]  3[]  4[]  UN[]    7 Limb Ataxia 0[x]  1[]  2[]  3[]  UN[]     8 Sensory 0[x]  1[]  2[]  UN[]      9 Best Language 0[]  1[x]  2[]  3[]      10 Dysarthria 0[]  1[x]  2[]  UN[]      11 Extinct. and Inattention 0[x]  1[]  2[]       TOTAL: 8      ROS  Comprehensive ROS performed and pertinent positives documented in HPI   Past History   Past Medical History:  Diagnosis Date   AF (paroxysmal atrial fibrillation) (HCC) 11/29/2018   Atrial flutter (HCC) 10/12/2015   a. TEE 3/17 with ? LAA clot-->s/p TEE/DCCV 11/24/2015; s/p DCCV 12/2022;s/p ablation 06/2023   CAD (coronary artery disease), native coronary artery 12/06/2015    a. 10/2015 MV: EF  37%, reversible defect inferior apex, intermediate risk findings; b. 10/2015 Cath: 20% mid RCA.   Carotid artery stenosis    1-39% right ICA stenosis and occluded left ICA   Colonic diverticular abscess    Diverticulitis    History of chemotherapy 2005   Cisplatin   Hypothyroidism    NICM (nonischemic cardiomyopathy) (HCC) 10/14/2015   a. Tachy mediated?;  b. Echo 3/17 - Mild concentric LVH, EF 30-35%, anteroseptal, anterior, anterolateral, apical anterior, lateral hypokinesis, trivial MR, mild to moderately reduced RVSF; c. LHC 3/17 - mRCA 20%   Radiation NOv.3,2005-Dec. 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  XRT    Past Surgical History:  Procedure Laterality Date   A-FLUTTER ABLATION N/A 06/19/2023   Procedure: A-FLUTTER ABLATION;  Surgeon: Cindie Ole DASEN, MD;  Location: [facility] INVASIVE CV LAB;  Service: Cardiovascular;  Laterality: N/A;   APPENDECTOMY N/A 03/07/2019   Procedure: ROBOTIC ASSISTED APPENDECTOMY;  Surgeon: Sheldon Standing, MD;  Location: [facility] ORS;  Service: General;  Laterality: N/A;  CARDIAC CATHETERIZATION N/A 10/18/2015   Procedure: Left Heart Cath and Coronary Angiography;  Surgeon: Lonni JONETTA Cash, MD;  Location: [facility] INVASIVE CV LAB;  Service: Cardiovascular;  Laterality: N/A;   CARDIOVERSION N/A 11/24/2015   Procedure: CARDIOVERSION;  Surgeon: Maude JAYSON Emmer, MD;  Location: [facility] ENDOSCOPY;  Service: Cardiovascular;  Laterality: N/A;   CARDIOVERSION N/A 12/20/2022   Procedure: CARDIOVERSION;  Surgeon: Alvan Ronal BRAVO, MD;  Location: [facility] INVASIVE CV LAB;  Service: Cardiovascular;  Laterality: N/A;   CYSTOSCOPY WITH STENT PLACEMENT Bilateral 03/07/2019   Procedure: CYSTOSCOPY WITH BILATERAL FIREFLY INJECTION;  Surgeon: Cam Morene ORN, MD;  Location: [facility] ORS;  Service: Urology;  Laterality: Bilateral;   GASTROSTOMY TUBE PLACEMENT  07/04/2004   IR - G tube for tonsilar cancer   IR ANGIO INTRA EXTRACRAN SEL  COM CAROTID INNOMINATE UNI R MOD SED  03/09/2019   IR ANGIO VERTEBRAL SEL VERTEBRAL UNI R MOD SED  03/09/2019   IR CT HEAD LTD  03/09/2019   IR PERCUTANEOUS ART THROMBECTOMY/INFUSION INTRACRANIAL INC DIAG ANGIO  03/09/2019   IR RADIOLOGIST EVAL & MGMT  12/18/2018   LAMINECTOMY     C5/placement of steel plate   LOOP RECORDER INSERTION N/A 06/19/2023   Procedure: LOOP RECORDER INSERTION;  Surgeon: Cindie Ole DASEN, MD;  Location: [facility] INVASIVE CV LAB;  Service: Cardiovascular;  Laterality: N/A;   NECK SURGERY  2003   replaced disk   RADIOLOGY WITH ANESTHESIA N/A 03/08/2019   Procedure: IR WITH ANESTHESIA;  Surgeon: Dolphus Carrion, MD;  Location: [facility] OR;  Service: Radiology;  Laterality: N/A;   TEE WITHOUT CARDIOVERSION N/A 10/15/2015   Procedure: TRANSESOPHAGEAL ECHOCARDIOGRAM (TEE);  Surgeon: Ezra GORMAN Shuck, MD;  Location: [facility] ENDOSCOPY;  Service: Cardiovascular;  Laterality: N/A;   TEE WITHOUT CARDIOVERSION N/A 11/24/2015   Procedure: TRANSESOPHAGEAL ECHOCARDIOGRAM (TEE);  Surgeon: Maude JAYSON Emmer, MD;  Location: [facility] ENDOSCOPY;  Service: Cardiovascular;  Laterality: N/A;   TEE WITHOUT CARDIOVERSION N/A 12/20/2022   Procedure: TRANSESOPHAGEAL ECHOCARDIOGRAM;  Surgeon: Alvan Ronal BRAVO, MD;  Location: [facility] INVASIVE CV LAB;  Service: Cardiovascular;  Laterality: N/A;   XI ROBOTIC ASSISTED COLOSTOMY TAKEDOWN N/A 03/07/2019   Procedure: XI ROBOTIC ASSISTED LOW ANTERIOR RESECTION, RIGID PROCTOSCOPY;  Surgeon: Sheldon Standing, MD;  Location: [facility]S;  Service: General;  Laterality: N/A;    Family History: Family History  Problem Relation Age of Onset   Prostate cancer Father    Colon cancer Father    Skin cancer Mother    Cancer Brother    Esophageal cancer Neg Hx    Rectal cancer Neg Hx    Stomach cancer Neg Hx     Social History  reports that he has been smoking cigarettes. He has a 10 pack-year smoking history. He has never used smokeless tobacco. He reports current alcohol use of about 12.0 standard  drinks of alcohol per week. He reports that he does not use drugs.  No Known Allergies  Medications   Current Facility-Administered Medications:    sodium chloride  flush (NS) 0.9 % injection 3 mL, 3 mL, Intravenous, Once, Towana Ozell JAYSON, MD  Current Outpatient Medications:    apixaban  (ELIQUIS ) 5 MG TABS tablet, Take 1 tablet (5 mg total) by mouth 2 (two) times daily., Disp: 180 tablet, Rfl: 1   atorvastatin  (LIPITOR ) 40 MG tablet, TAKE 1 TABLET BY MOUTH EVERY DAY, Disp: 90 tablet, Rfl: 0   ibuprofen (ADVIL) 200 MG tablet, Take 200-400 mg by mouth daily as needed (pain.)., Disp: , Rfl:  levothyroxine  (SYNTHROID ) 125 MCG tablet, TAKE 1 TABLET BY MOUTH EVERY DAY (Patient taking differently: Take 112 mcg by mouth daily.), Disp: 90 tablet, Rfl: 0   metoprolol  tartrate (LOPRESSOR ) 50 MG tablet, Take 1 tablet (50 mg total) by mouth 2 (two) times daily., Disp: 180 tablet, Rfl: 3  Vitals  There were no vitals filed for this visit.  There is no height or weight on file to calculate BMI.  Physical Exam   General: Laying comfortably in bed; in no acute distress.  HENT: Normal oropharynx and mucosa. Normal external appearance of ears and nose.  Neck: Supple, no pain or tenderness  CV: No JVD. No peripheral edema.  Pulmonary: Symmetric Chest rise. Normal respiratory effort.  Abdomen: Soft to touch, non-tender.  Ext: No cyanosis, edema, or deformity  Skin: No rash. Normal palpation of skin.   Musculoskeletal: Normal digits and nails by inspection. No clubbing.   Neurologic Examination  Mental status/Cognition: Alert, oriented to self, place, month and year, good attention.  Speech/language: dysarthric, fluent, comprehension intact, object naming intact, repetition intact. Mildly hesitant speech with some paraphasic errors. Cranial nerves:   CN II Pupils equal and reactive to light, no VF deficits    CN III,IV,VI EOM intact, no gaze preference or deviation, no nystagmus    CN V normal  sensation in V1, V2, and V3 segments bilaterally    CN VII R facial droop   CN VIII normal hearing to speech    CN IX & X normal palatal elevation, no uvular deviation    CN XI 5/5 head turn and 5/5 shoulder shrug bilaterally    CN XII midline tongue protrusion   Motor:  Muscle bulk: normal, tone flaccid in RUE Mvmt Root Nerve  Muscle Right Left Comments  SA C5/6 Ax Deltoid 1 5   EF C5/6 Mc Biceps 1 5   EE C6/7/8 Rad Triceps 1 5   WF C6/7 Med FCR 1    WE C7/8 PIN ECU 1    F Ab C8/T1 U ADM/FDI 1 5   HF L1/2/3 Fem Illopsoas 2 5   KE L2/3/4 Fem Quad 2 5   DF L4/5 D Peron Tib Ant 3 5   PF S1/2 Tibial Grc/Sol 3 5    Sensation:  Light touch Intact throughout   Pin prick    Temperature    Vibration   Proprioception    Coordination/Complex Motor:  - Finger to Nose intact on the left - Heel to shin unable to do due to weakness in his R leg - Rapid alternating movement are intact on the left - Gait: deferred   Labs/Imaging/Neurodiagnostic studies   CBC: No results for input(s): WBC, NEUTROABS, HGB, HCT, MCV, PLT in the last 168 hours. Basic Metabolic Panel:  Lab Results  Component Value Date   NA 140 06/08/2023   K 4.8 06/08/2023   CO2 29 06/08/2023   GLUCOSE 99 06/08/2023   BUN 14 06/08/2023   CREATININE 1.03 06/08/2023   CALCIUM  10.0 06/08/2023   GFRNONAA >60 12/20/2022   GFRAA 111 12/19/2019   Lipid Panel:  Lab Results  Component Value Date   LDLCALC 68 02/02/2023   HgbA1c:  Lab Results  Component Value Date   HGBA1C 5.5 03/09/2019   Urine Drug Screen: No results found for: LABOPIA, COCAINSCRNUR, LABBENZ, AMPHETMU, THCU, LABBARB  Alcohol Level No results found for: [facility] INR  Lab Results  Component Value Date   INR 1.3 (H) 03/09/2019   APTT  Lab Results  Component Value Date   APTT 88 (H) 03/10/2019   AED levels: No results found for: PHENYTOIN, ZONISAMIDE, LAMOTRIGINE, LEVETIRACETA  CT Head without  contrast(Personally reviewed): L BG ICH with intraventricular extension.  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 Found to have L BG ICH with IVH. ICH score of 1.  RECOMMENDATIONS  Left basal ganglia ICH with intraventricular extension: - Admit to ICU - Reverse Eliquis  with Andexxa  - Stability scan in 6 hours or STAT with any neurological decline - Frequent neuro checks; q10min for 1 hour, then q1hour - No antiplatelets or anticoagulants due to ICH - SCD for DVT prophylaxis, pharmacological DVT ppx at 24 hours if ICH is stable - Blood pressure control with goal systolic 110 - 130, cleverplex and hydralazine  PRN(HR is below 60) CTA with spot sign. - Stroke labs, HgbA1c, fasting lipid panel - MRI brain with and without contrast when stabilized to evaluate for underlying mass - MRA without contrast of the brain and Vasc US  carotid duplex to evaluate for underlying vascular abnormality. - Risk factor modification - Echocardiogram - PT consult, OT consult, Speech consult. - Stroke team to follow  Hypertensive emergency: - Goal SBP as above. PRN Hydralazine  and cleviprex  to SBP within goal.  Atrial fibrillation on Eliquis : - Reverse Eliquis  with Andexxa . - Hold Eliquis  in the setting of ICH. -Hold off on metoprolol .  Will give as needed metoprolol  IV 5 mg for heart rate above 120.  Hyperlipidemia: - Continue home atorvastatin .    Hypothyroidism: continue home Synthroid . ______________________________________________________________________  UPDATE: 3:59 PM CTA with spot sign, will aim for a lower SBP goal of 110-130.   This patient is critically ill and at significant risk of neurological worsening, death and care requires constant monitoring of  vital signs, hemodynamics,respiratory and cardiac monitoring, neurological assessment, discussion with family, other specialists and medical decision making of high complexity. I spent 35 minutes of neurocritical care time  in the care of  this patient. This was time spent independent of any time provided by nurse practitioner or PA.  Uilani Sanville [facility] 08/11/2023  2:35 PM  Signed, Farley Crooker, MD [facility]

## 2023-08-11 NOTE — Code Documentation (Signed)
 Stroke Response Nurse Documentation Code Documentation  Adrian [name] is a 68 y.o. male arriving to [facility]  via [facility] on 08/11/2023 with past medical hx of A. Flutter, CAD. On Eliquis  (apixaban ) daily. Code stroke was activated by EMS.   Patient from home where he was LKW at 1100 and now complaining of right sided weakness.  He made his coffee at 1100 and thereafter experienced sudden onset of right sided weakness. When his symptoms did not improve he called EMS.   Stroke team at the bedside on patient arrival. Labs drawn and patient cleared for CT by Dr. Towana. Patient to CT with team.   NIHSS 8, see documentation for details and code stroke times. Patient with right facial droop, right arm weakness, right leg weakness, and Expressive aphasia  on exam.   The following imaging was completed:  CT Head and CTA. Patient is not a candidate for IV Thrombolytic due to ICH. Patient is not a candidate for IR due to ICH.   Care Plan: q1h NIHSS, VS, and pupils.   Bedside handoff with ED RN Chiquita.    Adi Seales L Kaysee Hergert  Rapid Response RN

## 2023-08-11 NOTE — ED Triage Notes (Signed)
 Patient brought in by [facility] as a code stroke for sudden onset right sided weakness that began at 1245. Patient called his wife around 28. Patient's initial BP with EMS 237/116. Second BP 211 palp. Patient endorses only right sided weakness, no other deficits. LVO 1 with EMS. Patient arrives GCS 15. Patient is on eliquis . 18 LAC and 20 Lhand.

## 2023-08-12 ENCOUNTER — Inpatient Hospital Stay (HOSPITAL_COMMUNITY): Payer: Medicare Other

## 2023-08-12 DIAGNOSIS — I610 Nontraumatic intracerebral hemorrhage in hemisphere, subcortical: Secondary | ICD-10-CM | POA: Diagnosis not present

## 2023-08-12 LAB — BASIC METABOLIC PANEL
Anion gap: 11 (ref 5–15)
BUN: 11 mg/dL (ref 8–23)
CO2: 23 mmol/L (ref 22–32)
Calcium: 9.2 mg/dL (ref 8.9–10.3)
Chloride: 99 mmol/L (ref 98–111)
Creatinine, Ser: 0.73 mg/dL (ref 0.61–1.24)
GFR, Estimated: >60 mL/min (ref >60)
Glucose, Bld: 109 mg/dL — ABNORMAL HIGH (ref 70–99)
Potassium: 3.8 mmol/L (ref 3.5–5.1)
Sodium: 133 mmol/L — ABNORMAL LOW (ref 135–145)

## 2023-08-12 LAB — LIPID PANEL
Cholesterol: 199 mg/dL (ref 0–200)
HDL: 56 mg/dL (ref >40)
LDL Cholesterol: 117 mg/dL — ABNORMAL HIGH (ref 0–99)
Total CHOL/HDL Ratio: 3.6 {ratio}
Triglycerides: 128 mg/dL (ref <150)
VLDL: 26 mg/dL (ref 0–40)

## 2023-08-12 LAB — CBC
HCT: 44 % (ref 39.0–52.0)
Hemoglobin: 15.8 g/dL (ref 13.0–17.0)
MCH: 34.5 pg — ABNORMAL HIGH (ref 26.0–34.0)
MCHC: 35.9 g/dL (ref 30.0–36.0)
MCV: 96.1 fL (ref 80.0–100.0)
Platelets: 152 10*3/uL (ref 150–400)
RBC: 4.58 MIL/uL (ref 4.22–5.81)
RDW: 12.9 % (ref 11.5–15.5)
WBC: 2.7 10*3/uL — ABNORMAL LOW (ref 4.0–10.5)
nRBC: 0 % (ref 0.0–0.2)

## 2023-08-12 MED ORDER — LEVOTHYROXINE SODIUM 25 MCG PO TABS
125.0000 ug | ORAL_TABLET | Freq: Every day | ORAL | Status: DC
  Administered 2023-08-12 – 2023-08-14 (×3): 125 ug via ORAL
  Filled 2023-08-12 (×3): qty 1

## 2023-08-12 MED ORDER — METOPROLOL TARTRATE 50 MG PO TABS
50.0000 mg | ORAL_TABLET | Freq: Two times a day (BID) | ORAL | Status: DC
  Administered 2023-08-12 – 2023-08-13 (×3): 50 mg via ORAL
  Filled 2023-08-12 (×4): qty 1

## 2023-08-12 NOTE — Progress Notes (Signed)
 Spoke with Dr. Jerrie shortly after 0200 about patient's systolic blood pressure being below goal while off clevidipine  and while sleeping. She informed me that as long as the patient's exam remains stable she was ok with soft pressures. She informed me we could do a IVF bolus if it remains low but to encourage PO fluids.

## 2023-08-12 NOTE — Evaluation (Signed)
 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   Diverticular stricture (HCC) 03/07/2019   Bacteria in urine    Chronic atrial fibrillation (HCC) 12/02/2018   Current use of long term anticoagulation 12/02/2018   UTI (urinary tract infection) 11/29/2018   Hyponatremia 11/29/2018   PAF (paroxysmal atrial fibrillation) (HCC) 11/29/2018   Diverticulitis of large intestine with abscess 09/23/2018   Dyslipidemia 12/05/2017   CAD (coronary artery disease), native coronary artery 12/06/2015   SOB (shortness of breath)    NICM (nonischemic cardiomyopathy) (HCC)    Thyroid  activity decreased    Hypothyroidism 08/12/2013   History of radiation therapy 05/30/2012   Smokes tobacco daily 05/30/2012   Cancer of tonsillar fossa (HCC) 11/27/2011   Tonsil cancer (HCC) 2005   History of chemotherapy 2005   Past Medical History:  Past Medical History:  Diagnosis Date   AF (paroxysmal atrial fibrillation) (HCC) 11/29/2018   Atrial flutter (HCC) 10/12/2015   a. TEE 3/17 with ? LAA clot-->s/p TEE/DCCV 11/24/2015; s/p DCCV 12/2022;s/p ablation 06/2023   CAD (coronary artery disease), native coronary artery 12/06/2015   a. 10/2015 MV: EF  37%, reversible defect inferior apex, intermediate risk findings; b. 10/2015 Cath: 20% mid RCA.   Carotid artery stenosis    1-39% right ICA stenosis and occluded left ICA   Colonic diverticular abscess    Diverticulitis    History of chemotherapy 2005   Cisplatin    Hypothyroidism    NICM (nonischemic cardiomyopathy) (HCC) 10/14/2015   a. Tachy mediated?;  b. Echo 3/17 - Mild concentric LVH, EF 30-35%, anteroseptal, anterior, anterolateral, apical anterior, lateral hypokinesis, trivial MR, mild to moderately reduced RVSF; c. LHC 3/17 - mRCA 20%   Radiation NOv.3,2005-Dec. 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  XRT   Past Surgical History:  Past Surgical History:  Procedure Laterality Date   A-FLUTTER ABLATION N/A 06/19/2023   Procedure: A-FLUTTER ABLATION;  Surgeon: Cindie Ole DASEN, MD;  Location: [facility] INVASIVE CV LAB;  Service: Cardiovascular;  Laterality: N/A;   APPENDECTOMY N/A 03/07/2019   Procedure: ROBOTIC ASSISTED APPENDECTOMY;  Surgeon: Sheldon Standing, MD;  Location: [facility] ORS;  Service: General;  Laterality: N/A;   CARDIAC CATHETERIZATION N/A 10/18/2015   Procedure: Left Heart Cath and Coronary Angiography;  Surgeon: Lonni JONETTA Cash, MD;  Location: [facility] INVASIVE CV LAB;  Service: Cardiovascular;  Laterality: N/A;   CARDIOVERSION N/A 11/24/2015   Procedure: CARDIOVERSION;  Surgeon: Maude JAYSON Emmer, MD;  [facility];  Service: Cardiovascular;  Laterality: N/A;   CARDIOVERSION N/A 12/20/2022   Procedure: CARDIOVERSION;  Surgeon: Alvan Ronal BRAVO, MD;  Location: [facility] INVASIVE CV LAB;  Service: Cardiovascular;  Laterality: N/A;   CYSTOSCOPY WITH STENT PLACEMENT Bilateral 03/07/2019   Procedure: CYSTOSCOPY WITH BILATERAL FIREFLY INJECTION;  Surgeon: Cam Morene ORN, MD;  Location: [facility] ORS;  Service: Urology;  Laterality: Bilateral;   GASTROSTOMY TUBE PLACEMENT  07/04/2004   IR - G tube for tonsilar cancer   IR ANGIO INTRA EXTRACRAN SEL  COM CAROTID INNOMINATE UNI R MOD SED  03/09/2019   IR ANGIO VERTEBRAL SEL VERTEBRAL UNI R MOD SED  03/09/2019   IR CT HEAD LTD  03/09/2019   IR PERCUTANEOUS ART THROMBECTOMY/INFUSION INTRACRANIAL INC DIAG ANGIO  03/09/2019   IR RADIOLOGIST EVAL & MGMT  12/18/2018    LAMINECTOMY     C5/placement of steel plate   LOOP RECORDER INSERTION N/A 06/19/2023   Procedure: LOOP RECORDER INSERTION;  Surgeon: Cindie Ole DASEN, MD;  Location: [facility] INVASIVE CV LAB;  Service: Cardiovascular;  Laterality: N/A;   NECK SURGERY  2003   replaced disk   RADIOLOGY WITH ANESTHESIA N/A 03/08/2019   Procedure: IR WITH ANESTHESIA;  Surgeon: Dolphus Carrion, MD;  Location: [facility] OR;  Service: Radiology;  Laterality: N/A;   TEE WITHOUT CARDIOVERSION N/A 10/15/2015   Procedure: TRANSESOPHAGEAL ECHOCARDIOGRAM (TEE);  Surgeon: Ezra GORMAN Shuck, MD;  Location: [facility] ENDOSCOPY;  Service: Cardiovascular;  Laterality: N/A;   TEE WITHOUT CARDIOVERSION N/A 11/24/2015   Procedure: TRANSESOPHAGEAL ECHOCARDIOGRAM (TEE);  Surgeon: Maude JAYSON Emmer, MD;  Location: [facility] ENDOSCOPY;  Service: Cardiovascular;  Laterality: N/A;   TEE WITHOUT CARDIOVERSION N/A 12/20/2022   Procedure: TRANSESOPHAGEAL ECHOCARDIOGRAM;  Surgeon: Alvan Ronal BRAVO, MD;  Location: [facility] INVASIVE CV LAB;  Service: Cardiovascular;  Laterality: N/A;   XI ROBOTIC ASSISTED COLOSTOMY TAKEDOWN N/A 03/07/2019   Procedure: XI ROBOTIC ASSISTED LOW ANTERIOR RESECTION, RIGID PROCTOSCOPY;  Surgeon: Sheldon Standing, MD;  Location: [facility] ORS;  Service: General;  Laterality: N/A;   HPI:  Adrian [name] is a 68 yo male presenting to ED 1/4 with sudden onset R sided weakness. CTH includes ICH centered in the L basal ganglia with intraventricular extension. Pt seen August-October 2020 for dysphagia and cognitive by SLP targeting executive functioning, memory, attention, aphasia, and anomia.  PMH includes A-fib, carotid artery stenosis, hypothyroidism, HLD   Assessment / Plan / Recommendation Clinical Impression  Pt presents with suspected acute on chronic cognitive linguistic deficits characterized by difficulty with memory, attention, speech intelligibility, naming, and awareness. Per chart review, pt has a history of aphasia in 2020 for which he worked with OP SLP. He  reports he lives at home alone and is typically independent. Pt scored 13/30 on the SLUMS, which is below the score of 27 or above that is considered WFL. He reports he feels he is at his baseline when SLP presented his score, although feel he is likely experiencing acute cognitive deficits. Pt will likely benefit from intensive SLP f/u to facilitate return to PLOF. Will continue following.    SLP Assessment  SLP Recommendation/Assessment: Patient needs continued Speech Lanaguage Pathology Services SLP Visit Diagnosis: Dysarthria and anarthria (R47.1);Cognitive communication deficit (R41.841)    Recommendations for follow up therapy are one component of a multi-disciplinary discharge planning process, led by the attending physician.  Recommendations may be updated based on patient status, additional functional criteria and insurance authorization.    Follow Up Recommendations  Acute inpatient rehab (3hours/day)    Assistance Recommended at Discharge  Frequent or constant Supervision/Assistance  Functional Status Assessment Patient has had a recent decline in their functional status and demonstrates the ability to make significant improvements in function in a reasonable and predictable amount of time.  Frequency and Duration min 2x/week  2 weeks      SLP Evaluation Cognition  Overall Cognitive Status: History of cognitive impairments - at baseline Arousal/Alertness: Awake/alert Orientation Level: Oriented X4 Attention: Sustained Sustained Attention: Impaired Sustained Attention Impairment: Verbal basic Memory: Impaired Memory Impairment:  Storage deficit;Retrieval deficit Awareness: Impaired Awareness Impairment: Emergent impairment Problem Solving: Impaired Problem Solving Impairment: Verbal basic       Comprehension  Auditory Comprehension Overall Auditory Comprehension: Appears within functional limits for tasks assessed    Expression Expression Primary Mode of Expression:  Verbal Verbal Expression Overall Verbal Expression: Impaired Naming: Impairment Divergent: 0-24% accurate   Oral / Motor  Oral Motor/Sensory Function Overall Oral Motor/Sensory Function: Within functional limits Motor Speech Overall Motor Speech: Impaired Respiration: Within functional limits Phonation: Normal Resonance: Within functional limits Articulation: Impaired Level of Impairment: Sentence Intelligibility: Intelligibility reduced Sentence: 50-74% accurate            Damien Blumenthal, M.A., CF-SLP Speech Language Pathology, Acute Rehabilitation Services  Secure Chat preferred [phone]  08/12/2023, 9:57 AM

## 2023-08-12 NOTE — Evaluation (Signed)
 Physical Therapy Evaluation Patient Details Name: Adrian [name] MRN: [id number] DOB: 05-Sep-1955 Today's Date: 08/12/2023  History of Present Illness  The pt is a 68 yo male presenting 1/4 with R sided weakness. CT showed a large Left basal ganglia ICH with IVH extension. PMH includes:  A-fib, carotid artery stenosis, CAD, tonsil cancer, hypothyroidism, L MCA CVA.   Clinical Impression  Pt in bed upon arrival of PT, agreeable to evaluation at this time. Prior to admission the pt was independent without need for DME, living alone, driving, and no reports of falls. The pt presents with limitations in functional mobility, strength, ROM, balance, motor planning, and endurance due to above dx, and will continue to benefit from skilled PT to address these deficits. He required minA of 2 for bed mobility and up to modA of 2 to complete OOB transfers at this time. He was limited to small lateral steps with assist to wt shift and advance RLE as well as prevent blocking of RLE due to deficits in strength. Given prior level of independence, recommend intensive therapies to facilitate return to maximal level of independence.           If plan is discharge home, recommend the following: Two people to help with walking and/or transfers;Two people to help with bathing/dressing/bathroom;Assistance with cooking/housework;Assistance with feeding;Direct supervision/assist for medications management;Direct supervision/assist for financial management;Help with stairs or ramp for entrance   Can travel by private vehicle        Equipment Recommendations Other (comment) (defer to post acute)  Recommendations for Other Services  Rehab consult    Functional Status Assessment Patient has had a recent decline in their functional status and demonstrates the ability to make significant improvements in function in a reasonable and predictable amount of time.     Precautions / Restrictions Precautions Precautions:  Fall Precaution Comments: R hemi Restrictions Weight Bearing Restrictions Per Provider Order: No      Mobility  Bed Mobility Overal bed mobility: Needs Assistance Bed Mobility: Supine to Sit     Supine to sit: Min assist, +2 for physical assistance, +2 for safety/equipment, HOB elevated, Used rails     General bed mobility comments: VC to initiate and to utilize bed rail on the left side of bed as needed.    Transfers Overall transfer level: Needs assistance Equipment used: None, 2 person hand held assist Transfers: Sit to/from Stand, Bed to chair/wheelchair/BSC Sit to Stand: Min assist, +2 physical assistance, From elevated surface   Step pivot transfers: Mod assist, +2 physical assistance, +2 safety/equipment       General transfer comment: VC for technique and sequencing provided. Physical assist to weight shift in order to move RLE. Right knee blocked to prevent buckling when weight bearing.    Ambulation/Gait Ambulation/Gait assistance: Mod assist, +2 physical assistance Gait Distance (Feet): 3 Feet Assistive device: 2 person hand held assist Gait Pattern/deviations: Step-to pattern, Decreased stride length, Knee hyperextension - right, Knee flexed in stance - right Gait velocity: decreased Gait velocity interpretation: <1.31 ft/sec, indicative of household ambulator   General Gait Details: assist to wt shift for stepping. assist to advance RLE. blocking of RLE, HHA in LUE     Modified Rankin (Stroke Patients Only) Modified Rankin (Stroke Patients Only) Pre-Morbid Rankin Score: No significant disability Modified Rankin: Severe disability     Balance Overall balance assessment: Needs assistance, History of Falls Sitting-balance support: Single extremity supported, Feet supported Sitting balance-Leahy Scale: Fair Sitting balance - Comments: sitting EOB Postural  control: Posterior lean (standing) Standing balance support: Bilateral upper extremity supported,  During functional activity Standing balance-Leahy Scale: Zero Standing balance comment: Posterior bias while standing. Unable to self correct when cued. Reliant on therapists for balance.                             Pertinent Vitals/Pain Pain Assessment Pain Assessment: No/denies pain    Home Living Family/patient expects to be discharged to:: Private residence Living Arrangements: Alone Available Help at Discharge: Family;Available 24 hours/day (Option to stay with his Ex wife short term when discharged if necessary) Type of Home: House Home Access: Stairs to enter Entrance Stairs-Rails: Right Entrance Stairs-Number of Steps: 2   Home Layout: One level Home Equipment: Grab bars - tub/shower;Hand held shower head;Grab bars - toilet      Prior Function Prior Level of Function : Independent/Modified Independent;Driving;History of Falls (last six months)             Mobility Comments: no use of DME, independent living alone ADLs Comments: independent living alone     Extremity/Trunk Assessment   Upper Extremity Assessment Upper Extremity Assessment: Defer to OT evaluation RUE Deficits / Details: A/ROM shoulder flexion ~25% range, elbow flexion 50% range, wrist flexion/extension and supination/pronation WFL. Able to demonstrate full finger extension and flexion. MMT: 3-/5 shoulder flexion, elbow flexion/extension, 3/5 wrist supination/pronation, 3/5 wrist flexion, 3-/5 wrist extension, impaired gross grasp. RUE Sensation: decreased proprioception RUE Coordination: decreased gross motor;decreased fine motor    Lower Extremity Assessment Lower Extremity Assessment: RLE deficits/detail RLE Deficits / Details: limited stregnth, control, power. able to move against gravity but hyperextends in stance. limited hip flexion and poor advancement for steps RLE Sensation: decreased light touch RLE Coordination: decreased fine motor;decreased gross motor    Cervical /  Trunk Assessment Cervical / Trunk Assessment: Normal  Communication   Communication Communication: Difficulty communicating thoughts/reduced clarity of speech Cueing Techniques: Verbal cues  Cognition Arousal: Alert Behavior During Therapy: WFL for tasks assessed/performed Overall Cognitive Status: Impaired/Different from baseline Area of Impairment: Orientation                 Orientation Level: Time (stated the year was 2026)             General Comments: at times difficult word finding, for example stated tuesday when asked the month, then corrected to correct month        General Comments General comments (skin integrity, edema, etc.): BP checked prior to mobility. 163/93. Nursing gave the ok to continue with therapy evaluation and transfer out of bed. BP monitored during session    Exercises     Assessment/Plan    PT Assessment Patient needs continued PT services  PT Problem List Decreased strength;Decreased activity tolerance;Decreased balance;Decreased mobility;Decreased coordination;Decreased safety awareness       PT Treatment Interventions DME instruction;Gait training;Stair training;Functional mobility training;Therapeutic activities;Therapeutic exercise;Balance training;Neuromuscular re-education;Patient/family education    PT Goals (Current goals can be found in the Care Plan section)  Acute Rehab PT Goals Patient Stated Goal: return to independence PT Goal Formulation: With patient/family Time For Goal Achievement: 08/26/23 Potential to Achieve Goals: Good    Frequency Min 1X/week     Co-evaluation   Reason for Co-Treatment: To address functional/ADL transfers   OT goals addressed during session: Strengthening/ROM;Proper use of Adaptive equipment and DME;ADL's and self-care       AM-PAC PT 6 Clicks Mobility  Outcome Measure Help needed  turning from your back to your side while in a flat bed without using bedrails?: A Little Help  needed moving from lying on your back to sitting on the side of a flat bed without using bedrails?: A Little Help needed moving to and from a bed to a chair (including a wheelchair)?: A Lot Help needed standing up from a chair using your arms (e.g., wheelchair or bedside chair)?: A Lot Help needed to walk in hospital room?: Total Help needed climbing 3-5 steps with a railing? : Total 6 Click Score: 12    End of Session Equipment Utilized During Treatment: Gait belt Activity Tolerance: Patient tolerated treatment well;Patient limited by fatigue Patient left: in chair;with call bell/phone within reach;with chair alarm set;with family/visitor present Nurse Communication: Mobility status PT Visit Diagnosis: Unsteadiness on feet (R26.81);Muscle weakness (generalized) (M62.81);Hemiplegia and hemiparesis Hemiplegia - Right/Left: Right Hemiplegia - dominant/non-dominant: Dominant Hemiplegia - caused by: Nontraumatic intracerebral hemorrhage    Time: 1217-1245 PT Time Calculation (min) (ACUTE ONLY): 28 min   Charges:   PT Evaluation $PT Eval Moderate Complexity: 1 Mod   PT General Charges $$ ACUTE PT VISIT: 1 Visit         Izetta Call, PT, DPT   Acute Rehabilitation Department Office [phone] Secure Chat Communication Preferred  Izetta JULIANNA Call 08/12/2023, 5:16 PM

## 2023-08-12 NOTE — Evaluation (Signed)
 Occupational Therapy Evaluation Patient Details Name: Adrian [name] MRN: [id number] DOB: February 04, 1956 Today's Date: 08/12/2023   History of Present Illness 68 y.o. male with hx of A-fib, carotid artery stenosis, CAD, tonsil cancer, hypothyroidism, who presents with sudden onset right-sided weakness. CT head showed a large basal ganglia ICH with IVH extension.   Clinical Impression   Pt admitted with the above diagnosis. Pt currently with functional limitations due to the deficits listed below (see OT Problem List). Prior to admit, pt was living alone at home independent with all ADL tasks and functional mobility. Patient will benefit from intensive inpatient follow up therapy, >3 hours/day.  Pt will benefit from acute skilled OT to increase their safety and independence with ADL and functional mobility for ADL to facilitate discharge. OT will continue to follow patient acutely.         If plan is discharge home, recommend the following: Two people to help with walking and/or transfers;A lot of help with bathing/dressing/bathroom;Assistance with cooking/housework;Assist for transportation;Help with stairs or ramp for entrance    Functional Status Assessment  Patient has had a recent decline in their functional status and demonstrates the ability to make significant improvements in function in a reasonable and predictable amount of time.  Equipment Recommendations  Other (comment) (defer to next venue of care)    Recommendations for Other Services Rehab consult     Precautions / Restrictions Precautions Precautions: Fall Precaution Comments: R hemi Restrictions Weight Bearing Restrictions Per Provider Order: No      Mobility Bed Mobility Overal bed mobility: Needs Assistance Bed Mobility: Supine to Sit     Supine to sit: Min assist, +2 for physical assistance, +2 for safety/equipment, HOB elevated, Used rails     General bed mobility comments: VC to initiate and to utilize bed  rail on the left side of bed as needed.    Transfers Overall transfer level: Needs assistance Equipment used: None, 2 person hand held assist Transfers: Sit to/from Stand, Bed to chair/wheelchair/BSC Sit to Stand: Min assist, +2 physical assistance, From elevated surface     Step pivot transfers: Mod assist, +2 physical assistance, +2 safety/equipment     General transfer comment: VC for technique and sequencing provided. Physical assist to weight shift in order to move RLE. Right knee blocked to prevent buckling when weight bearing.      Balance Overall balance assessment: Needs assistance, History of Falls Sitting-balance support: Single extremity supported, Feet supported Sitting balance-Leahy Scale: Fair Sitting balance - Comments: sitting EOB Postural control: Posterior lean (standing) Standing balance support: Bilateral upper extremity supported, During functional activity Standing balance-Leahy Scale: Zero Standing balance comment: Posterior bias while standing. Unable to self correct when cued. Reliant on therapists for balance.       ADL either performed or assessed with clinical judgement   ADL Overall ADL's : Needs assistance/impaired Eating/Feeding: Set up;Sitting   Grooming: Set up;Sitting   Upper Body Bathing: Moderate assistance;Sitting   Lower Body Bathing: Maximal assistance;Sit to/from stand;Sitting/lateral leans   Upper Body Dressing : Moderate assistance;Sitting   Lower Body Dressing: Total assistance;Sitting/lateral leans;Sit to/from stand   Toilet Transfer: Moderate assistance;+2 for physical assistance;Cueing for sequencing;Cueing for safety;Stand-pivot;BSC/3in1   Toileting- Clothing Manipulation and Hygiene: Total assistance;Sit to/from stand               Vision Baseline Vision/History: 1 Wears glasses Ability to See in Adequate Light: 0 Adequate Patient Visual Report: No change from baseline Vision Assessment?: No apparent visual  deficits  Perception Perception: Impaired Preception Impairment Details: Inattention/Neglect, Spatial orientation Perception-Other Comments: slight Right side neglect/inattention   Praxis Praxis: Impaired Praxis Impairment Details: Motor planning, Organization Praxis-Other Comments: Right upper and lower extremity   Pertinent Vitals/Pain Pain Assessment Pain Assessment: No/denies pain     Extremity/Trunk Assessment Upper Extremity Assessment RUE Deficits / Details: A/ROM shoulder flexion ~25% range, elbow flexion 50% range, wrist flexion/extension and supination/pronation WFL. Able to demonstrate full finger extension and flexion. MMT: 3-/5 shoulder flexion, elbow flexion/extension, 3/5 wrist supination/pronation, 3/5 wrist flexion, 3-/5 wrist extension, impaired gross grasp. RUE Sensation: decreased proprioception RUE Coordination: decreased gross motor;decreased fine motor   Lower Extremity Assessment Lower Extremity Assessment: Defer to PT evaluation   Cervical / Trunk Assessment Cervical / Trunk Assessment: Normal   Communication Communication Communication: Difficulty communicating thoughts/reduced clarity of speech Cueing Techniques: Verbal cues   Cognition Arousal: Alert Behavior During Therapy: WFL for tasks assessed/performed Overall Cognitive Status: Impaired/Different from baseline Area of Impairment: Orientation                 Orientation Level: Time (stated the year was 2026)             General Comments: receptive aphasia noted     General Comments  BP checked prior to mobility. 163/93. Nursing gave the ok to continue with therapy evaluation and transfer out of bed. BP monitored during session    Exercises Exercises: Other exercises Other Exercises Other Exercises: Provided with green resistive foam block to work on Right hand strength. VC and visual demonstration provided on use. Pt verbalized understanding.        Home Living  Family/patient expects to be discharged to:: Private residence Living Arrangements: Alone Available Help at Discharge: Family;Available 24 hours/day (Option to stay with his Ex wife short term when discharged if necessary) Type of Home: House Home Access: Stairs to enter [employer] of Steps: 2 Entrance Stairs-Rails: Right Home Layout: One level     Bathroom Shower/Tub: Chief Strategy Officer: Standard     Home Equipment: Grab bars - tub/shower;Hand held shower head;Grab bars - toilet          Prior Functioning/Environment Prior Level of Function : Independent/Modified Independent;Driving;History of Falls (last six months)           OT Problem List: Decreased strength;Decreased range of motion;Decreased activity tolerance;Impaired balance (sitting and/or standing);Decreased coordination;Decreased cognition;Impaired sensation;Impaired UE functional use      OT Treatment/Interventions: Self-care/ADL training;Therapeutic exercise;Neuromuscular education;Energy conservation;DME and/or AE instruction;Manual therapy;Modalities;Therapeutic activities;Cognitive remediation/compensation;Patient/family education;Balance training    OT Goals(Current goals can be found in the care plan section) Acute Rehab OT Goals OT Goal Formulation: With patient Time For Goal Achievement: 08/26/23 Potential to Achieve Goals: Good  OT Frequency: Min 1X/week    Co-evaluation PT/OT/SLP Co-Evaluation/Treatment: Yes Reason for Co-Treatment: To address functional/ADL transfers   OT goals addressed during session: Strengthening/ROM;Proper use of Adaptive equipment and DME;ADL's and self-care      AM-PAC OT 6 Clicks Daily Activity     Outcome Measure Help from another person eating meals?: A Little Help from another person taking care of personal grooming?: A Little Help from another person toileting, which includes using toliet, bedpan, or urinal?: Total Help from another  person bathing (including washing, rinsing, drying)?: Total Help from another person to put on and taking off regular upper body clothing?: A Lot Help from another person to put on and taking off regular lower body clothing?: Total 6 Click Score: 11  End of Session Equipment Utilized During Treatment: Gait belt Nurse Communication: Mobility status  Activity Tolerance: Patient tolerated treatment well Patient left: in chair;with call bell/phone within reach;with chair alarm set;with family/visitor present;with nursing/sitter in room  OT Visit Diagnosis: Unsteadiness on feet (R26.81);Muscle weakness (generalized) (M62.81);History of falling (Z91.81);Ataxia, unspecified (R27.0);Hemiplegia and hemiparesis Hemiplegia - Right/Left: Right Hemiplegia - dominant/non-dominant: Dominant Hemiplegia - caused by: Nontraumatic intracerebral hemorrhage                Time: 1216-1245 OT Time Calculation (min): 29 min Charges:  OT General Charges $OT Visit: 1 Visit OT Evaluation $OT Eval High Complexity: 1 High  [employer], OTR/L,CBIS  Supplemental OT - [facility] and [facility] Secure Chat Preferred    Abygail Galeno, Leita BIRCH 08/12/2023, 4:26 PM

## 2023-08-12 NOTE — Progress Notes (Addendum)
 STROKE TEAM PROGRESS NOTE   BRIEF HPI [name] is a 68 y.o. male with history of atrial fibrillation, on Eliquis , carotid artery stenosis, hypothyroidism who presented 1/4 with acute onset of right-sided weakness.  Patient called EMS and he was brought in as a code stroke.  Noted to be hypertensive up to 230 systolic per EMS. On exam at bridge patient exhibited right-sided facial droop, right arm and leg weakness, dysarthria.  CT head showed a large basal ganglia ICH with IVH extension. CTA showed spot sign and blood pressure parameters were tightened to a goal of 110-130 Patient was admitted to 4 N. ICU for monitoring and strict blood pressure control.  LKW: 1100 1/4 mRs: 0 IVT: not offered d/t ICH EVT: not offered d/t ICH ICH Score: 1  NIH on Admission: 8   SIGNIFICANT HOSPITAL EVENTS 1/4: Admitted to 4N ICU for strict BP control due to ICH with spot sign 1/5 AM: CTH stable  INTERIM HISTORY/SUBJECTIVE  Blood pressure parameters lightening to less than 160 due to stable CT head this morning. Wean cleviprex  as able. Plan to transfer out of ICU if BP remains controlled without IV medications. Will restart home PO medications.   RN at bedside on exam.  Patient lying in bed he is fully oriented and able to give clear history.  He does continue to have right arm weakness.  Patient denies any issues with bleeding while on Eliquis . States that he does not check his BP at home but does have a cuff that he can use. Educated further on BP control in secondary stroke prevention.   OBJECTIVE  CBC    Component Value Date/Time   WBC 2.7 (L) 08/12/2023 0428   RBC 4.58 08/12/2023 0428   HGB 15.8 08/12/2023 0428   HGB 15.5 06/08/2023 1147   HGB 15.4 10/24/2010 0927   HCT 44.0 08/12/2023 0428   HCT 46.2 06/08/2023 1147   HCT 43.9 10/24/2010 0927   PLT 152 08/12/2023 0428   PLT 174 06/08/2023 1147   MCV 96.1 08/12/2023 0428   MCV 104 (H) 06/08/2023 1147   MCV 99.7 (H) 10/24/2010  0927   MCH 34.5 (H) 08/12/2023 0428   MCHC 35.9 08/12/2023 0428   RDW 12.9 08/12/2023 0428   RDW 13.5 06/08/2023 1147   RDW 12.8 10/24/2010 0927   LYMPHSABS 1.0 08/11/2023 1417   LYMPHSABS 1.0 10/24/2010 0927   MONOABS 0.8 08/11/2023 1417   MONOABS 0.5 10/24/2010 0927   EOSABS 0.0 08/11/2023 1417   EOSABS 0.1 10/24/2010 0927   BASOSABS 0.0 08/11/2023 1417   BASOSABS 0.0 10/24/2010 0927    BMET    Component Value Date/Time   NA 133 (L) 08/12/2023 0428   NA 140 06/08/2023 1147   K 3.8 08/12/2023 0428   CL 99 08/12/2023 0428   CO2 23 08/12/2023 0428   GLUCOSE 109 (H) 08/12/2023 0428   BUN 11 08/12/2023 0428   BUN 14 06/08/2023 1147   CREATININE 0.73 08/12/2023 0428   CREATININE 0.88 11/22/2015 1137   CALCIUM  9.2 08/12/2023 0428   EGFR 80 06/08/2023 1147   GFRNONAA >60 08/12/2023 0428    IMAGING past 24 hours CT HEAD WO CONTRAST Result Date: 08/12/2023 CLINICAL DATA:  Stroke follow-up EXAM: CT HEAD WITHOUT CONTRAST TECHNIQUE: Contiguous axial images were obtained from the base of the skull through the vertex without intravenous contrast. RADIATION DOSE REDUCTION: This exam was performed according to the departmental dose-optimization program which includes automated exposure control, adjustment of the  mA and/or kV according to patient size and/or use of iterative reconstruction technique. COMPARISON:  Brain MRI from yesterday FINDINGS: Brain: Some Collison and retraction of blood products in the left lateral ventricle, no evidence of progressive bleeding. The parenchymal hemorrhage centered at the upper left basal ganglia and corona radiata is stable in size at up to 2 cm span on axial images. Chronic infarct at the left insula and frontotemporal operculum. Vascular: No acute finding Skull: Normal. Negative for fracture or focal lesion. Sinuses/Orbits: No acute finding. IMPRESSION: No progression of left basal ganglia hemorrhage with intraventricular extension. Lateral ventricular  clot. No hydrocephalus. Electronically Signed   By: Dorn Roulette M.D.   On: 08/12/2023 07:25   MR BRAIN W WO CONTRAST Result Date: 08/11/2023 CLINICAL DATA:  Stroke, follow up Neuro deficit, acute, stroke suspected EXAM: MRI HEAD WITHOUT AND WITH CONTRAST TECHNIQUE: Multiplanar, multiecho pulse sequences of the brain and surrounding structures were obtained without and with intravenous contrast. CONTRAST:  6.5mL GADAVIST  GADOBUTROL  1 MMOL/ML IV SOLN COMPARISON:  CT head from today. FINDINGS: Brain: When comparing across modalities, no substantial change in intraparenchymal hemorrhage in the left basal ganglia with intraventricular extension. No visible surrounding acute hemorrhage or mass lesion; however, acute blood products limits assessment. Prior left frontal infarct with encephalomalacia. No hydrocephalus. No pathologic enhancement. Vascular: Major arterial flow voids are maintained at the skull base. Skull and upper cervical spine: Normal marrow signal. Sinuses/Orbits: Right frontal sinus opacification. Remaining sinuses are clear. No acute orbital findings. Other: No mastoid effusions. IMPRESSION: 1. When comparing across modalities, no substantial change in intraparenchymal hemorrhage in the left basal ganglia with intraventricular extension. No progressive mass effect. 2. No visible surrounding acute hemorrhage or mass lesion; however, acute blood products limits assessment Electronically Signed   By: Gilmore GORMAN Molt M.D.   On: 08/11/2023 20:38   CT ANGIO HEAD NECK W WO CM (CODE STROKE) Result Date: 08/11/2023 CLINICAL DATA:  Neuro deficit, acute, stroke suspected. Acute right-sided weakness. EXAM: CT ANGIOGRAPHY HEAD AND NECK WITH AND WITHOUT CONTRAST TECHNIQUE: Multidetector CT imaging of the head and neck was performed using the standard protocol during bolus administration of intravenous contrast. Multiplanar CT image reconstructions and MIPs were obtained to evaluate the vascular anatomy.  Carotid stenosis measurements (when applicable) are obtained utilizing NASCET criteria, using the distal internal carotid diameter as the denominator. RADIATION DOSE REDUCTION: This exam was performed according to the departmental dose-optimization program which includes automated exposure control, adjustment of the mA and/or kV according to patient size and/or use of iterative reconstruction technique. CONTRAST:  75mL OMNIPAQUE  IOHEXOL  350 MG/ML SOLN COMPARISON:  CTA head and neck 03/08/2019 FINDINGS: CTA NECK FINDINGS Aortic arch: Normal variant 4 vessel aortic arch with the left vertebral artery arising directly from the arch. No significant stenosis of the arch vessel origins. Right carotid system: Patent with a small amount of calcified plaque at the carotid bifurcation and in the proximal ICA. No evidence of a significant stenosis or dissection. Left carotid system: The common carotid artery is patent. There is prominent calcified and soft plaque at the carotid bifurcation with chronic occlusion of the ICA at the bulb without reconstitution in the neck. Vertebral arteries: Patent and codominant without evidence of a significant stenosis or dissection. Skeleton: Solid C5-6 ACDF. Advanced disc degeneration at C3-4 and C6-7 with asymmetric right neural foraminal stenosis at these levels. Asymmetrically advanced left facet arthrosis in the upper cervical spine. Other neck: No evidence of cervical lymphadenopathy or mass. Upper chest: Biapical  pleuroparenchymal lung scarring. Review of the MIP images confirms the above findings CTA HEAD FINDINGS Anterior circulation: The intracranial right ICA is patent with mild atherosclerosis not resulting in significant stenosis. There is reconstitution of the left ICA beginning in the horizontal petrous segment with the vessel remaining grossly patent though irregularly narrowed distally through the terminus, with a particularly thready appearance in the distal petrous and  proximal cavernous segments. ACAs and MCAs are patent without evidence of a significant A1 or M1 stenosis. There is attenuation of left MCA branch vessels in the setting of a chronic infarct. Two adjacent punctate foci of contrast are present in the medial aspect of the acute left basal ganglia hemorrhage suggestive of contrast extravasation and possible ongoing bleeding. No aneurysm or vascular malformation is identified. Posterior circulation: The intracranial vertebral arteries are widely patent to the basilar. The basilar artery is widely patent. Posterior communicating arteries are diminutive or absent. The PCAs are patent with the right PCA appearing mildly smaller than the left diffusely but without evidence of a flow limiting proximal stenosis. No aneurysm is identified. Venous sinuses: Patent. Anatomic variants: None. Review of the MIP images confirms the above findings These results were communicated to Dr. Vanessa at 2:54 pm on 08/11/2023 by text page via the [facility] messaging system. IMPRESSION: 1. CTA spot sign/punctate foci of contrast extravasation within the left basal ganglia hematoma. 2. No aneurysm, vascular malformation, or acute large vessel occlusion identified. 3. 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 Electronically Signed   By: Dasie Hamburg M.D.   On: 08/11/2023 15:08   CT HEAD CODE STROKE WO CONTRAST Result Date: 08/11/2023 CLINICAL DATA:  Code stroke.  Neuro deficit, acute, stroke suspected EXAM: CT HEAD WITHOUT CONTRAST TECHNIQUE: Contiguous axial images were obtained from the base of the skull through the vertex without intravenous contrast. RADIATION DOSE REDUCTION: This exam was performed according to the departmental dose-optimization program which includes automated exposure control, adjustment of the mA and/or kV according to patient size and/or use of iterative reconstruction technique. COMPARISON:  Head CT 06/08/2023 FINDINGS: Brain: 1.8 x  2.0 x 1.1 cm parenchymal hemorrhage centered in the left basal ganglia with intraventricular extension. There are layering blood products in the left occipital horn. No evidence of hydrocephalus at the time of exam. No significant mass effect or midline shift. There is chronic infarct involving the left frontal lobe and insular region. No extra-axial fluid collection. Vascular: No hyperdense vessel or unexpected calcification. Skull: Normal. Negative for fracture or focal lesion. Sinuses/Orbits: No middle ear or mastoid effusion. Paranasal sinuses are notable for mucosal thickening in the right frontal and anterior ethmoid air cells. Orbits are unremarkable. Other: None ASPECTS ([place] Stroke Program Early CT Score) IMPRESSION: 1.8 x 2.0 x 1.1 cm parenchymal hemorrhage centered in the left basal ganglia with intraventricular extension. No evidence of hydrocephalus at the time of exam. No significant midline shift. Findings were paged to Dr. Vanessa on 08/11/23 at 2:29 PM Electronically Signed   By: Lyndall Gore M.D.   On: 08/11/2023 14:31    Vitals:   08/12/23 0715 08/12/23 0800 08/12/23 0900 08/12/23 1000  BP: 119/74 116/67 (!) 146/72 (!) 155/89  Pulse: (!) 58 61 (!) 59 70  Resp: 13 13 14 17   Temp:  98.3 F (36.8 C)    TempSrc:  Axillary    SpO2: 94% 92% 93% 94%  Weight:      Height:         PHYSICAL EXAM  General:  Alert, well-nourished, well-developed patient in no acute distress Psych:  Mood and affect appropriate for situation CV: Regular rate and rhythm on monitor, SR to SB Respiratory:  Regular, unlabored respirations on room air   NEURO:  Mental Status: AA&Ox3, patient is able to give clear and coherent history Speech/Language: speech is without dysarthria or aphasia.  Naming, repetition, fluency, and comprehension intact.  Cranial Nerves:  II: PERRL. Visual fields full.  III, IV, VI: EOMI. Eyelids elevate symmetrically.  V: Sensation is intact to light touch and symmetrical  to face.  VII: right facial droop, mild VIII: hearing intact to voice. IX, X: Palate elevates symmetrically. Phonation is normal.  KP:Dynloizm shrug 5/5. XII: tongue is midline without fasciculations. Motor: 5/5 LUE, LLE no drifts RUE: 1/5 shoulder, 3/5 bicep, 4-/5 grip, finger abduction/adduction with poor control and coordination. Strong drift present.  RLE: 4-/5 throughout, slight drift  Tone: is normal and bulk is normal Sensation- Decreased to right arm and leg.    Coordination: unable to perform on right. No tremor or overt ataxia noted.  Gait- deferred  Most Recent NIH: 6    ASSESSMENT/PLAN  Left BG ICH with IVH, etiology: Likely hypertensive in the setting of eliquis  use Code Stroke CT head - Left basal ganglia IPH, measuring 1.8 x 2.0 x 1.1 cm with intraventricular extension CTA head & neck w/Perfusion, Spot sign within the left basal ganglia hematoma. No aneurysm, vascular malformation or acute LVO identified. 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 MRI w/wo No substantial change in IPH in left basal ganglia.  No progressive mass effect CTH No progression of left basal ganglia hemorrhage with intraventricular extension Lateral ventricular clot. No hydrocephalus 2D Echo: LVEF 60 to 65% no LVH LDL 117 HgbA1c pending VTE prophylaxis - SCDs Eliquis  BID  prior to admission, now on No antithrombotic due to ICH. Pt will follow up with Dr. Rosemarie as outpt to consider ASPIRE trial if interested, or restart eliquis  in 2 weeks if stable, or follow up with cardiology for other options including Watchman device. Therapy recommendations:  CIR Disposition:  pending  Atrial Fibrillation Atrial Flutter Home Meds: Amiodarone  200 mg, Eliquis  5 mg twice daily, Lopressor  25 mg twice daily Lopressor  restarted Ablation performed 06/19/23 Loop Recorder placed 06/19/23 post A-fib ablation Sees Dr. Shlomo cardiology for outpatient Pt will follow up with Dr.  Rosemarie as outpt to consider ASPIRE trial if interested, or restart eliquis  in 2 weeks if stable, or follow up with cardiology for other options including Watchman device.  Left ICA occlusion, chronic Carotid Doppler 07/09/23: Evidence consistent with total occlusion of the left ICA. Also found to have right subclavian artery stenosis. He was referred to Dr. Gretta with vascular surgery and has appointment later this month. CTA head and neck showed chronic left ICA occlusion but no subclavian artery stenosis.   Hypertension Home meds:  Lopressor  50mg , restarted Weaning clieviprex gtt Blood Pressure Goal: SBP less than 160  Long term BP goal normotensive  Hyperlipidemia Home meds:  Lipitor  40mg  LDL 117, goal < 70 Hold statin due to ICH consider to increase to Lipitor  80mg  on discharge  Tobacco Abuse Patient smokes 0.5 packs per day        Ready to quit? Yes Cessation education provided  Other Stroke Risk Factors ETOH use, alcohol level <10, advised to drink no more than 1-2 drink(s) a day Coronary artery disease  Other active problems Leukopenia, appear chronic Labs in 2021 first showing WBC 2.3->2.7  Hypothyroidism Restart home synthroid   Hospital day # 1   Pt seen by Neuro NP/APP and later by MD. Note/plan to be edited by MD as needed.    Rocky JAYSON Likes, DNP, AGACNP-BC [facility] Please use AMION for contact information & EPIC for messaging.  ATTENDING NOTE: I reviewed above note and agree with the assessment and plan. Pt was seen and examined.   No family at the bedside. Pt lying in bed, AAO x 3, no aphasia, following all commands. Still has mild right facial droop, slight tongue protrusion to the right. RUE 1/5 deltoid, 3/5 bicep, 4/5 finger grip. RLE proximal 3+/5 and distal 1/5. Sensation symmetrical. Left FTN intact.   Pt ICH and IVH likely due to HTN in the setting of eliquis  use. Now stable with serial images. BP goal < 160. Off cleviprex . Hold off AC  due to ICH, may consider outpt follow up with Dr. Rosemarie for ASPIRE consideration, restart eliquis  in 2-3 weeks or cardiology consideration of alternative regimen such as WATCHMAN device. Quit smoking.   For detailed assessment and plan, please refer to above/below as I have made changes wherever appropriate.   Ary Cummins, MD PhD Stroke Neurology 08/12/2023 4:12 PM  This patient is critically ill due to ICH and IVH, afib off AC now, hypertensive emergency and at significant risk of neurological worsening, death form hematoma expansion, brain herniation, hydrocephalus, stroke, Afib RVR, heart failure. This patient's care requires constant monitoring of vital signs, hemodynamics, respiratory and cardiac monitoring, review of multiple databases, neurological assessment, discussion with family, other specialists and medical decision making of high complexity. I spent 40 minutes of neurocritical care time in the care of this patient.     To contact Stroke Continuity provider, please refer to [email or web address]. After hours, contact General Neurology

## 2023-08-13 ENCOUNTER — Other Ambulatory Visit (HOSPITAL_COMMUNITY): Payer: Self-pay

## 2023-08-13 DIAGNOSIS — I610 Nontraumatic intracerebral hemorrhage in hemisphere, subcortical: Secondary | ICD-10-CM

## 2023-08-13 DIAGNOSIS — G8191 Hemiplegia, unspecified affecting right dominant side: Secondary | ICD-10-CM | POA: Diagnosis not present

## 2023-08-13 LAB — CBC
HCT: 43.9 % (ref 39.0–52.0)
Hemoglobin: 15.5 g/dL (ref 13.0–17.0)
MCH: 34.6 pg — ABNORMAL HIGH (ref 26.0–34.0)
MCHC: 35.3 g/dL (ref 30.0–36.0)
MCV: 98 fL (ref 80.0–100.0)
Platelets: 138 10*3/uL — ABNORMAL LOW (ref 150–400)
RBC: 4.48 MIL/uL (ref 4.22–5.81)
RDW: 13 % (ref 11.5–15.5)
WBC: 2.9 10*3/uL — ABNORMAL LOW (ref 4.0–10.5)
nRBC: 0 % (ref 0.0–0.2)

## 2023-08-13 LAB — BASIC METABOLIC PANEL
Anion gap: 10 (ref 5–15)
BUN: 18 mg/dL (ref 8–23)
CO2: 23 mmol/L (ref 22–32)
Calcium: 9.1 mg/dL (ref 8.9–10.3)
Chloride: 100 mmol/L (ref 98–111)
Creatinine, Ser: 0.91 mg/dL (ref 0.61–1.24)
GFR, Estimated: >60 mL/min (ref >60)
Glucose, Bld: 108 mg/dL — ABNORMAL HIGH (ref 70–99)
Potassium: 3.8 mmol/L (ref 3.5–5.1)
Sodium: 133 mmol/L — ABNORMAL LOW (ref 135–145)

## 2023-08-13 LAB — PATHOLOGIST SMEAR REVIEW

## 2023-08-13 LAB — HEMOGLOBIN A1C
Hgb A1c MFr Bld: 5.6 % (ref 4.8–5.6)
Mean Plasma Glucose: 114 mg/dL

## 2023-08-13 MED ORDER — ATORVASTATIN CALCIUM 80 MG PO TABS
80.0000 mg | ORAL_TABLET | Freq: Every day | ORAL | Status: DC
Start: 2023-08-13 — End: 2023-08-14
  Administered 2023-08-13 – 2023-08-14 (×2): 80 mg via ORAL
  Filled 2023-08-13 (×2): qty 1

## 2023-08-13 MED ORDER — HEPARIN SODIUM (PORCINE) 5000 UNIT/ML IJ SOLN
5000.0000 [IU] | Freq: Three times a day (TID) | INTRAMUSCULAR | Status: DC
  Administered 2023-08-13 – 2023-08-14 (×4): 5000 [IU] via SUBCUTANEOUS
  Filled 2023-08-13 (×4): qty 1

## 2023-08-13 MED ORDER — ACETAMINOPHEN 160 MG/5ML PO SUSP
650.0000 mg | ORAL | 0 refills | Status: DC | PRN
  Filled 2023-08-13: qty 118, 1d supply, fill #0

## 2023-08-13 NOTE — Progress Notes (Signed)

## 2023-08-13 NOTE — Progress Notes (Signed)
 Inpatient Rehab Coordinator Note:  I met with patient at bedside, and ex wife [name] by phone, to discuss CIR recommendations and goals/expectations of CIR stay.  We reviewed 3 hrs/day of therapy, physician follow up, and average length of stay 2 weeks (dependent upon progress) with goals of mod I.  [name] states that she can provide 24/7 supervision. She also states that the patient recently retired and does not have BCBS anymore since 2025, Medicare should be primary insurnace. Will verify insurance and plan for CIR admission when medically stable.  Rehab Admissons Coordinator Curren Mohrmann, [place], [place] [phone]

## 2023-08-13 NOTE — Plan of Care (Signed)
  Problem: Education: Goal: Knowledge of disease or condition will improve Outcome: Progressing   Problem: Intracerebral Hemorrhage Tissue Perfusion: Goal: Complications of Intracerebral Hemorrhage will be minimized Outcome: Progressing   Problem: Coping: Goal: Will verbalize positive feelings about self 08/13/2023 1359 by Trinty Marken, Ellery SAILOR, RN Outcome: Progressing 08/13/2023 1357 by [place] Ellery SAILOR, RN Outcome: Progressing Goal: Will identify appropriate support needs 08/13/2023 1359 by Cayle Cordoba, Ellery SAILOR, RN Outcome: Progressing 08/13/2023 1357 by [place] Ellery SAILOR, RN Outcome: Progressing   Problem: Health Behavior/Discharge Planning: Goal: Ability to manage health-related needs will improve Outcome: Progressing   Problem: Self-Care: Goal: Ability to participate in self-care as condition permits will improve Outcome: Progressing Goal: Verbalization of feelings and concerns over difficulty with self-care will improve Outcome: Progressing Goal: Ability to communicate needs accurately will improve Outcome: Progressing   Problem: Activity: Goal: Risk for activity intolerance will decrease Outcome: Progressing   Problem: Nutrition: Goal: Adequate nutrition will be maintained Outcome: Progressing   Problem: Coping: Goal: Level of anxiety will decrease Outcome: Progressing   Problem: Elimination: Goal: Will not experience complications related to bowel motility Outcome: Progressing Goal: Will not experience complications related to urinary retention Outcome: Progressing   Problem: Pain Management: Goal: General experience of comfort will improve Outcome: Progressing   Problem: Safety: Goal: Ability to remain free from injury will improve Outcome: Progressing   Problem: Skin Integrity: Goal: Risk for impaired skin integrity will decrease Outcome: Progressing

## 2023-08-13 NOTE — Progress Notes (Signed)
 Orthopedic Tech Progress Note Patient Details:  Adrian [name] 13-Aug-1955 [id number]  Ortho Devices Type of Ortho Device: Prafo boot/shoe Ortho Device/Splint Location: RLE Ortho Device/Splint Interventions: Ordered, Application, Adjustment   Post Interventions Patient Tolerated: Well Instructions Provided: Care of device  Adrian [name] Pac 08/13/2023, 1:02 PM

## 2023-08-13 NOTE — Consult Note (Signed)
 Physical Medicine and Rehabilitation Consult Reason for Consult:right sided weakness and functional deficits Referring Physician: Jerri   HPI: Adrian [name] is a 68 y.o. male with a history of atrial fibrillation on Eliquis , hypothyroidism and carotid artery stenosis who presented on 08/11/2023 with right sided weakness.  Patient was hypertensive upon arrival of EMS.  CT of the head demonstrated large basal ganglia intracranial hemorrhage with intraventricular hemorrhage extension.  CTA of the head and neck demonstrated spot sign with left basal ganglia hematoma.  No aneurysms, malformations or large vessel occlusions were identified.  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  Patient's Eliquis  was held and blood pressure was controlled.  Patient mobilized with physical therapy yesterday and was min assist for sit to stand transfers and mod assist for step pivot transfers.  He was able to ambulate 3 feet with mod assist +2 using hand-held assist.  Patient was independent and living alone prior to arrival.  Apparently had been having some falls.  He does have an ex-wife who may be able to assist him after discharge.   Review of Systems  Constitutional: Negative.   HENT: Negative.    Eyes: Negative.   Respiratory: Negative.    Cardiovascular: Negative.   Gastrointestinal: Negative.   Genitourinary: Negative.   Musculoskeletal:  Positive for falls and myalgias.  Skin: Negative.   Neurological:  Positive for focal weakness.  Psychiatric/Behavioral: Negative.     Past Medical History:  Diagnosis Date   AF (paroxysmal atrial fibrillation) (HCC) 11/29/2018   Atrial flutter (HCC) 10/12/2015   a. TEE 3/17 with ? LAA clot-->s/p TEE/DCCV 11/24/2015; s/p DCCV 12/2022;s/p ablation 06/2023   CAD (coronary artery disease), native coronary artery 12/06/2015   a. 10/2015 MV: EF  37%, reversible defect inferior apex, intermediate risk findings; b. 10/2015 Cath:  20% mid RCA.   Carotid artery stenosis    1-39% right ICA stenosis and occluded left ICA   Colonic diverticular abscess    Diverticulitis    History of chemotherapy 2005   Cisplatin   Hypothyroidism    NICM (nonischemic cardiomyopathy) (HCC) 10/14/2015   a. Tachy mediated?;  b. Echo 3/17 - Mild concentric LVH, EF 30-35%, anteroseptal, anterior, anterolateral, apical anterior, lateral hypokinesis, trivial MR, mild to moderately reduced RVSF; c. LHC 3/17 - mRCA 20%   Radiation NOv.3,2005-Dec. 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  XRT   Past Surgical History:  Procedure Laterality Date   A-FLUTTER ABLATION N/A 06/19/2023   Procedure: A-FLUTTER ABLATION;  Surgeon: Cindie Ole DASEN, MD;  Location: [facility] INVASIVE CV LAB;  Service: Cardiovascular;  Laterality: N/A;   APPENDECTOMY N/A 03/07/2019   Procedure: ROBOTIC ASSISTED APPENDECTOMY;  Surgeon: Sheldon Standing, MD;  Location: [facility] ORS;  Service: General;  Laterality: N/A;   CARDIAC CATHETERIZATION N/A 10/18/2015   Procedure: Left Heart Cath and Coronary Angiography;  Surgeon: Lonni JONETTA Cash, MD;  Location: [facility] INVASIVE CV LAB;  Service: Cardiovascular;  Laterality: N/A;   CARDIOVERSION N/A 11/24/2015   Procedure: CARDIOVERSION;  Surgeon: Maude JAYSON Emmer, MD;  Location: [facility] ENDOSCOPY;  Service: Cardiovascular;  Laterality: N/A;   CARDIOVERSION N/A 12/20/2022   Procedure: CARDIOVERSION;  Surgeon: Alvan Ronal BRAVO, MD;  Location: [facility] INVASIVE CV LAB;  Service: Cardiovascular;  Laterality: N/A;   CYSTOSCOPY WITH STENT PLACEMENT Bilateral 03/07/2019   Procedure: CYSTOSCOPY WITH BILATERAL FIREFLY INJECTION;  Surgeon: Cam Morene ORN, MD;  Location: [facility] ORS;  Service: Urology;  Laterality: Bilateral;   GASTROSTOMY TUBE PLACEMENT  07/04/2004   IR - G tube for tonsilar cancer   IR ANGIO INTRA EXTRACRAN SEL COM CAROTID INNOMINATE UNI R MOD SED  03/09/2019   IR ANGIO VERTEBRAL SEL VERTEBRAL UNI R MOD SED  03/09/2019    IR CT HEAD LTD  03/09/2019   IR PERCUTANEOUS ART THROMBECTOMY/INFUSION INTRACRANIAL INC DIAG ANGIO  03/09/2019   IR RADIOLOGIST EVAL & MGMT  12/18/2018   LAMINECTOMY     C5/placement of steel plate   LOOP RECORDER INSERTION N/A 06/19/2023   Procedure: LOOP RECORDER INSERTION;  Surgeon: Cindie Ole DASEN, MD;  Location: [facility] INVASIVE CV LAB;  Service: Cardiovascular;  Laterality: N/A;   NECK SURGERY  2003   replaced disk   RADIOLOGY WITH ANESTHESIA N/A 03/08/2019   Procedure: IR WITH ANESTHESIA;  Surgeon: Dolphus Carrion, MD;  Location: [facility] OR;  Service: Radiology;  Laterality: N/A;   TEE WITHOUT CARDIOVERSION N/A 10/15/2015   Procedure: TRANSESOPHAGEAL ECHOCARDIOGRAM (TEE);  Surgeon: Ezra GORMAN Shuck, MD;  Location: [facility] ENDOSCOPY;  Service: Cardiovascular;  Laterality: N/A;   TEE WITHOUT CARDIOVERSION N/A 11/24/2015   Procedure: TRANSESOPHAGEAL ECHOCARDIOGRAM (TEE);  Surgeon: Maude JAYSON Emmer, MD;  Location: [facility] ENDOSCOPY;  Service: Cardiovascular;  Laterality: N/A;   TEE WITHOUT CARDIOVERSION N/A 12/20/2022   Procedure: TRANSESOPHAGEAL ECHOCARDIOGRAM;  Surgeon: Alvan Ronal BRAVO, MD;  Location: [facility] INVASIVE CV LAB;  Service: Cardiovascular;  Laterality: N/A;   XI ROBOTIC ASSISTED COLOSTOMY TAKEDOWN N/A 03/07/2019   Procedure: XI ROBOTIC ASSISTED LOW ANTERIOR RESECTION, RIGID PROCTOSCOPY;  Surgeon: Sheldon Standing, MD;  Location: [facility] ORS;  Service: General;  Laterality: N/A;   Family History  Problem Relation Age of Onset   Prostate cancer Father    Colon cancer Father    Skin cancer Mother    Cancer Brother    Esophageal cancer Neg Hx    Rectal cancer Neg Hx    Stomach cancer Neg Hx    Social History:  reports that he has been smoking cigarettes. He has a 10 pack-year smoking history. He has never used smokeless tobacco. He reports current alcohol use of about 12.0 standard drinks of alcohol per week. He reports that he does not use drugs. Allergies: No Known Allergies Medications Prior to Admission   Medication Sig Dispense Refill   amiodarone  (PACERONE ) 200 MG tablet Take 200 mg by mouth daily.     apixaban  (ELIQUIS ) 5 MG TABS tablet Take 1 tablet (5 mg total) by mouth 2 (two) times daily. 180 tablet 1   atorvastatin  (LIPITOR ) 40 MG tablet TAKE 1 TABLET BY MOUTH EVERY DAY 90 tablet 0   levothyroxine  (SYNTHROID ) 125 MCG tablet TAKE 1 TABLET BY MOUTH EVERY DAY 90 tablet 0   metoprolol  tartrate (LOPRESSOR ) 50 MG tablet Take 1 tablet (50 mg total) by mouth 2 (two) times daily. 180 tablet 3    Home: Home Living Family/patient expects to be discharged to:: Private residence Living Arrangements: Alone Available Help at Discharge: Family, Available 24 hours/day (Option to stay with his Ex wife short term when discharged if necessary) Type of Home: House Home Access: Stairs to enter Secretary/administrator of Steps: 2 Entrance Stairs-Rails: Right Home Layout: One level Bathroom Shower/Tub: Engineer, Manufacturing Systems: Standard Home Equipment: Grab bars - tub/shower, Hand held shower head, Grab bars - toilet  Lives With: Alone  Functional History: Prior Function Prior Level of Function : Independent/Modified Independent, Driving, History of Falls (last six months)  Mobility Comments: no use of DME, independent living alone ADLs Comments: independent living alone Functional Status:  Mobility: Bed Mobility Overal bed mobility: Needs Assistance Bed Mobility: Supine to Sit Supine to sit: Min assist, +2 for physical assistance, +2 for safety/equipment, HOB elevated, Used rails General bed mobility comments: VC to initiate and to utilize bed rail on the left side of bed as needed. Transfers Overall transfer level: Needs assistance Equipment used: None, 2 person hand held assist Transfers: Sit to/from Stand, Bed to chair/wheelchair/BSC Sit to Stand: Min assist, +2 physical assistance, From elevated surface Bed to/from chair/wheelchair/BSC transfer type:: Step pivot Step pivot  transfers: Mod assist, +2 physical assistance, +2 safety/equipment General transfer comment: VC for technique and sequencing provided. Physical assist to weight shift in order to move RLE. Right knee blocked to prevent buckling when weight bearing. Ambulation/Gait Ambulation/Gait assistance: Mod assist, +2 physical assistance Gait Distance (Feet): 3 Feet Assistive device: 2 person hand held assist Gait Pattern/deviations: Step-to pattern, Decreased stride length, Knee hyperextension - right, Knee flexed in stance - right General Gait Details: assist to wt shift for stepping. assist to advance RLE. blocking of RLE, HHA in LUE Gait velocity: decreased Gait velocity interpretation: <1.31 ft/sec, indicative of household ambulator    ADL: ADL Overall ADL's : Needs assistance/impaired Eating/Feeding: Set up, Sitting Grooming: Set up, Sitting Upper Body Bathing: Moderate assistance, Sitting Lower Body Bathing: Maximal assistance, Sit to/from stand, Sitting/lateral leans Upper Body Dressing : Moderate assistance, Sitting Lower Body Dressing: Total assistance, Sitting/lateral leans, Sit to/from stand Toilet Transfer: Moderate assistance, +2 for physical assistance, Cueing for sequencing, Cueing for safety, Stand-pivot, BSC/3in1 Toileting- Clothing Manipulation and Hygiene: Total assistance, Sit to/from stand  Cognition: Cognition Overall Cognitive Status: Impaired/Different from baseline Arousal/Alertness: Awake/alert Orientation Level: Oriented X4 Attention: Sustained Sustained Attention: Impaired Sustained Attention Impairment: Verbal basic Memory: Impaired Memory Impairment: Storage deficit, Retrieval deficit Awareness: Impaired Awareness Impairment: Emergent impairment Problem Solving: Impaired Problem Solving Impairment: Verbal basic Cognition Arousal: Alert Behavior During Therapy: WFL for tasks assessed/performed Overall Cognitive Status: Impaired/Different from baseline Area  of Impairment: Orientation Orientation Level: Time (stated the year was 2026) General Comments: at times difficult word finding, for example stated tuesday when asked the month, then corrected to correct month  Blood pressure (!) 148/76, pulse (!) 59, temperature 98.2 F (36.8 C), temperature source Oral, resp. rate 18, height 5' 8 (1.727 m), weight 63.5 kg, SpO2 95%. Physical Exam Constitutional:      General: He is not in acute distress. HENT:     Head: Normocephalic.     Right Ear: External ear normal.     Left Ear: External ear normal.     Nose: Nose normal.  Eyes:     Pupils: Pupils are equal, round, and reactive to light.  Cardiovascular:     Rate and Rhythm: Normal rate.  Pulmonary:     Effort: Pulmonary effort is normal.  Abdominal:     Palpations: Abdomen is soft.  Musculoskeletal:        General: No swelling or deformity. Normal range of motion.     Cervical back: Normal range of motion.     Comments: Right heel cord tight, difficult to passively range to neutral.  Skin:    General: Skin is warm.  Neurological:     Mental Status: He is alert.     Comments: Pt is alert and oriented to place. Follows basic commands. Has reasonable memory insight and awareness. Right central VII with dysarthria. CN exam otherwise  normal. MMT: RUE 3- prox to 3+ distally. RLE 3- prox to 0/5 distally. Sensory exam normal for light touch and pain in all 4 limbs. No limb ataxia or cerebellar signs. No abnormal tone appreciated.    Psychiatric:        Mood and Affect: Mood normal.        Behavior: Behavior normal.     Results for orders placed or performed during the hospital encounter of 08/11/23 (from the past 24 hours)  Basic metabolic panel     Status: Abnormal   Collection Time: 08/13/23  4:48 AM  Result Value Ref Range   Sodium 133 (L) 135 - 145 mmol/L   Potassium 3.8 3.5 - 5.1 mmol/L   Chloride 100 98 - 111 mmol/L   CO2 23 22 - 32 mmol/L   Glucose, Bld 108 (H) 70 - 99 mg/dL    BUN 18 8 - 23 mg/dL   Creatinine, Ser 9.08 0.61 - 1.24 mg/dL   Calcium  9.1 8.9 - 10.3 mg/dL   GFR, Estimated >39 >39 mL/min   Anion gap 10 5 - 15  CBC     Status: Abnormal   Collection Time: 08/13/23  4:48 AM  Result Value Ref Range   WBC 2.9 (L) 4.0 - 10.5 K/uL   RBC 4.48 4.22 - 5.81 MIL/uL   Hemoglobin 15.5 13.0 - 17.0 g/dL   HCT 56.0 60.9 - 47.9 %   MCV 98.0 80.0 - 100.0 fL   MCH 34.6 (H) 26.0 - 34.0 pg   MCHC 35.3 30.0 - 36.0 g/dL   RDW 86.9 88.4 - 84.4 %   Platelets 138 (L) 150 - 400 K/uL   nRBC 0.0 0.0 - 0.2 %   CT HEAD WO CONTRAST Result Date: 08/12/2023 CLINICAL DATA:  Stroke follow-up EXAM: CT HEAD WITHOUT CONTRAST TECHNIQUE: Contiguous axial images were obtained from the base of the skull through the vertex without intravenous contrast. RADIATION DOSE REDUCTION: This exam was performed according to the departmental dose-optimization program which includes automated exposure control, adjustment of the mA and/or kV according to patient size and/or use of iterative reconstruction technique. COMPARISON:  Brain MRI from yesterday FINDINGS: Brain: Some Collison and retraction of blood products in the left lateral ventricle, no evidence of progressive bleeding. The parenchymal hemorrhage centered at the upper left basal ganglia and corona radiata is stable in size at up to 2 cm span on axial images. Chronic infarct at the left insula and frontotemporal operculum. Vascular: No acute finding Skull: Normal. Negative for fracture or focal lesion. Sinuses/Orbits: No acute finding. IMPRESSION: No progression of left basal ganglia hemorrhage with intraventricular extension. Lateral ventricular clot. No hydrocephalus. Electronically Signed   By: Dorn Roulette M.D.   On: 08/12/2023 07:25   MR BRAIN W WO CONTRAST Result Date: 08/11/2023 CLINICAL DATA:  Stroke, follow up Neuro deficit, acute, stroke suspected EXAM: MRI HEAD WITHOUT AND WITH CONTRAST TECHNIQUE: Multiplanar, multiecho pulse sequences of  the brain and surrounding structures were obtained without and with intravenous contrast. CONTRAST:  6.5mL GADAVIST  GADOBUTROL  1 MMOL/ML IV SOLN COMPARISON:  CT head from today. FINDINGS: Brain: When comparing across modalities, no substantial change in intraparenchymal hemorrhage in the left basal ganglia with intraventricular extension. No visible surrounding acute hemorrhage or mass lesion; however, acute blood products limits assessment. Prior left frontal infarct with encephalomalacia. No hydrocephalus. No pathologic enhancement. Vascular: Major arterial flow voids are maintained at the skull base. Skull and upper cervical spine: Normal marrow signal. Sinuses/Orbits: Right frontal  sinus opacification. Remaining sinuses are clear. No acute orbital findings. Other: No mastoid effusions. IMPRESSION: 1. When comparing across modalities, no substantial change in intraparenchymal hemorrhage in the left basal ganglia with intraventricular extension. No progressive mass effect. 2. No visible surrounding acute hemorrhage or mass lesion; however, acute blood products limits assessment Electronically Signed   By: Gilmore GORMAN Molt M.D.   On: 08/11/2023 20:38   CT ANGIO HEAD NECK W WO CM (CODE STROKE) Result Date: 08/11/2023 CLINICAL DATA:  Neuro deficit, acute, stroke suspected. Acute right-sided weakness. EXAM: CT ANGIOGRAPHY HEAD AND NECK WITH AND WITHOUT CONTRAST TECHNIQUE: Multidetector CT imaging of the head and neck was performed using the standard protocol during bolus administration of intravenous contrast. Multiplanar CT image reconstructions and MIPs were obtained to evaluate the vascular anatomy. Carotid stenosis measurements (when applicable) are obtained utilizing NASCET criteria, using the distal internal carotid diameter as the denominator. RADIATION DOSE REDUCTION: This exam was performed according to the departmental dose-optimization program which includes automated exposure control, adjustment of the  mA and/or kV according to patient size and/or use of iterative reconstruction technique. CONTRAST:  75mL OMNIPAQUE  IOHEXOL  350 MG/ML SOLN COMPARISON:  CTA head and neck 03/08/2019 FINDINGS: CTA NECK FINDINGS Aortic arch: Normal variant 4 vessel aortic arch with the left vertebral artery arising directly from the arch. No significant stenosis of the arch vessel origins. Right carotid system: Patent with a small amount of calcified plaque at the carotid bifurcation and in the proximal ICA. No evidence of a significant stenosis or dissection. Left carotid system: The common carotid artery is patent. There is prominent calcified and soft plaque at the carotid bifurcation with chronic occlusion of the ICA at the bulb without reconstitution in the neck. Vertebral arteries: Patent and codominant without evidence of a significant stenosis or dissection. Skeleton: Solid C5-6 ACDF. Advanced disc degeneration at C3-4 and C6-7 with asymmetric right neural foraminal stenosis at these levels. Asymmetrically advanced left facet arthrosis in the upper cervical spine. Other neck: No evidence of cervical lymphadenopathy or mass. Upper chest: Biapical pleuroparenchymal lung scarring. Review of the MIP images confirms the above findings CTA HEAD FINDINGS Anterior circulation: The intracranial right ICA is patent with mild atherosclerosis not resulting in significant stenosis. There is reconstitution of the left ICA beginning in the horizontal petrous segment with the vessel remaining grossly patent though irregularly narrowed distally through the terminus, with a particularly thready appearance in the distal petrous and proximal cavernous segments. ACAs and MCAs are patent without evidence of a significant A1 or M1 stenosis. There is attenuation of left MCA branch vessels in the setting of a chronic infarct. Two adjacent punctate foci of contrast are present in the medial aspect of the acute left basal ganglia hemorrhage suggestive of  contrast extravasation and possible ongoing bleeding. No aneurysm or vascular malformation is identified. Posterior circulation: The intracranial vertebral arteries are widely patent to the basilar. The basilar artery is widely patent. Posterior communicating arteries are diminutive or absent. The PCAs are patent with the right PCA appearing mildly smaller than the left diffusely but without evidence of a flow limiting proximal stenosis. No aneurysm is identified. Venous sinuses: Patent. Anatomic variants: None. Review of the MIP images confirms the above findings These results were communicated to Dr. Vanessa at 2:54 pm on 08/11/2023 by text page via the [facility] messaging system. IMPRESSION: 1. CTA spot sign/punctate foci of contrast extravasation within the left basal ganglia hematoma. 2. No aneurysm, vascular malformation, or acute large vessel occlusion identified.  3. 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 Electronically Signed   By: Dasie Hamburg M.D.   On: 08/11/2023 15:08   CT HEAD CODE STROKE WO CONTRAST Result Date: 08/11/2023 CLINICAL DATA:  Code stroke.  Neuro deficit, acute, stroke suspected EXAM: CT HEAD WITHOUT CONTRAST TECHNIQUE: Contiguous axial images were obtained from the base of the skull through the vertex without intravenous contrast. RADIATION DOSE REDUCTION: This exam was performed according to the departmental dose-optimization program which includes automated exposure control, adjustment of the mA and/or kV according to patient size and/or use of iterative reconstruction technique. COMPARISON:  Head CT 06/08/2023 FINDINGS: Brain: 1.8 x 2.0 x 1.1 cm parenchymal hemorrhage centered in the left basal ganglia with intraventricular extension. There are layering blood products in the left occipital horn. No evidence of hydrocephalus at the time of exam. No significant mass effect or midline shift. There is chronic infarct involving the left frontal lobe and  insular region. No extra-axial fluid collection. Vascular: No hyperdense vessel or unexpected calcification. Skull: Normal. Negative for fracture or focal lesion. Sinuses/Orbits: No middle ear or mastoid effusion. Paranasal sinuses are notable for mucosal thickening in the right frontal and anterior ethmoid air cells. Orbits are unremarkable. Other: None ASPECTS ([place] Stroke Program Early CT Score) IMPRESSION: 1.8 x 2.0 x 1.1 cm parenchymal hemorrhage centered in the left basal ganglia with intraventricular extension. No evidence of hydrocephalus at the time of exam. No significant midline shift. Findings were paged to Dr. Vanessa on 08/11/23 at 2:29 PM Electronically Signed   By: Lyndall Gore M.D.   On: 08/11/2023 14:31    Assessment/Plan: Diagnosis: 68 year old male status post left basal ganglia hemorrhage due to hypertension and Eliquis  use with subsequent right hemiparesis Does the need for close, 24 hr/day medical supervision in concert with the patient's rehab needs make it unreasonable for this patient to be served in a less intensive setting? Yes Co-Morbidities requiring supervision/potential complications:  -Hypertension -Atrial fibrillation -Chronic leukopenia Due to bladder management, bowel management, safety, skin/wound care, disease management, medication administration, pain management, and patient education, does the patient require 24 hr/day rehab nursing? Yes Does the patient require coordinated care of a physician, rehab nurse, therapy disciplines of PT, OT, SLP to address physical and functional deficits in the context of the above medical diagnosis(es)? Yes Addressing deficits in the following areas: balance, endurance, locomotion, strength, transferring, bowel/bladder control, bathing, dressing, feeding, grooming, toileting, speech, and psychosocial support Can the patient actively participate in an intensive therapy program of at least 3 hrs of therapy per day at least 5  days per week? Yes The potential for patient to make measurable gains while on inpatient rehab is excellent Anticipated functional outcomes upon discharge from inpatient rehab are modified independent  with PT, modified independent and supervision with OT, modified independent with SLP. Estimated rehab length of stay to reach the above functional goals is: 12-16 days Anticipated discharge destination: Home Overall Rehab/Functional Prognosis: excellent  POST ACUTE RECOMMENDATIONS: This patient's condition is appropriate for continued rehabilitative care in the following setting: CIR Patient has agreed to participate in recommended program. Yes Note that insurance prior authorization may be required for reimbursement for recommended care.  Comment: Rehab Admissions Coordinator to follow up.     MEDICAL RECOMMENDATIONS: Will order PRAFO RLE as heel cord is already tight   I have personally performed a face to face diagnostic evaluation of this patient. Additionally, I have examined the patient's medical record including any pertinent labs  and radiographic images. If the physician assistant has documented in this note, I have reviewed and edited or otherwise concur with the physician assistant's documentation.  Thanks,  Arthea ONEIDA Gunther, MD 08/13/2023

## 2023-08-13 NOTE — Discharge Instructions (Signed)
 Dear Okey GORMAN Rummer,   Congratulations for your interest in quitting smoking!  Find a program that suits you best: when you want to quit, how you need support, where you live, and how you like to learn.    If you're ready to get started TODAY, consider scheduling a visit through [email or web address].  Appointments are available from 8am to 8pm, Monday to Friday.   Most health insurance plans will cover some level of tobacco cessation visits and medications.    Additional Resources: [employer] are also available to help you quit & provide the support you'll need. Many programs are available in both English and Spanish and have a long history of successfully helping people get off and stay off tobacco.    Quit Smoking Apps:  quitSTART at seriousbroker.de QuitGuide?at forgetparking.dk Online education and resources: Smokefree  at borders group.gov Free Telephone Coaching: QuitNow,  Call 1-800-QUIT-NOW (575-044-0878) or Text- Ready to (437)565-9926 *Quitline [place] has teamed up with Medicaid to offer a free 14 week program    Vaping- Want to Quit? Free 24/7 support. Call [facility]  [place], [place], [place], [place], [place]  [facility]

## 2023-08-13 NOTE — Plan of Care (Signed)
  Problem: Intracerebral Hemorrhage Tissue Perfusion: Goal: Complications of Intracerebral Hemorrhage will be minimized Outcome: Progressing   Problem: Coping: Goal: Will verbalize positive feelings about self Outcome: Progressing Goal: Will identify appropriate support needs Outcome: Progressing   Problem: Health Behavior/Discharge Planning: Goal: Ability to manage health-related needs will improve Outcome: Progressing   Problem: Self-Care: Goal: Ability to participate in self-care as condition permits will improve Outcome: Progressing Goal: Verbalization of feelings and concerns over difficulty with self-care will improve Outcome: Progressing Goal: Ability to communicate needs accurately will improve Outcome: Progressing   Problem: Activity: Goal: Risk for activity intolerance will decrease Outcome: Progressing   Problem: Nutrition: Goal: Adequate nutrition will be maintained Outcome: Progressing   Problem: Coping: Goal: Level of anxiety will decrease Outcome: Progressing   Problem: Elimination: Goal: Will not experience complications related to bowel motility Outcome: Progressing Goal: Will not experience complications related to urinary retention Outcome: Progressing   Problem: Pain Management: Goal: General experience of comfort will improve Outcome: Progressing   Problem: Safety: Goal: Ability to remain free from injury will improve Outcome: Progressing   Problem: Skin Integrity: Goal: Risk for impaired skin integrity will decrease Outcome: Progressing

## 2023-08-13 NOTE — TOC CM/SW Note (Signed)
 Transition of Care [facility]) - Inpatient Brief Assessment   Patient Details  Name: MAMOUDOU MULVEHILL MRN: [id number] Date of Birth: [birth year]  Transition of Care [facility]) CM/SW Contact:    Tom-Johnson, Adelfa Lozito Daphne, RN Phone Number: 08/13/2023, 3:41 PM   Clinical Narrative:  Patient presented to the ED with Rt-sided Facial Droop, Rt Arm/Leg Weakness and Dysarthria. Found to have a large Basal Ganglia ICH with IVH extension. On Room Air at this time. Neurology following. CIR recommended, plan to admit when medically ready.   Patient not Medically ready for discharge.  CM will continue to follow as patient progresses with care towards discharge.          Transition of Care Asessment:

## 2023-08-13 NOTE — Progress Notes (Signed)
 STROKE TEAM PROGRESS NOTE   BRIEF HPI [name] is a 68 y.o. male with history of atrial fibrillation, on Eliquis , carotid artery stenosis, hypothyroidism who presented 1/4 with acute onset of right-sided weakness.  Patient called EMS and he was brought in as a code stroke.  Noted to be hypertensive up to 230 systolic per EMS. On exam at bridge patient exhibited right-sided facial droop, right arm and leg weakness, dysarthria.  CT head showed a large basal ganglia ICH with IVH extension. CTA showed spot sign and blood pressure parameters were tightened to a goal of 110-130 Patient was admitted to 4 N. ICU for monitoring and strict blood pressure control.  LKW: 1100 1/4 mRs: 0 IVT: not offered d/t ICH EVT: not offered d/t ICH ICH Score: 1  NIH on Admission: 8   SIGNIFICANT HOSPITAL EVENTS 1/4: Admitted to 4N ICU for strict BP control due to ICH with spot sign 1/5 AM: CTH stable  INTERIM HISTORY/SUBJECTIVE No family at the bedside. Pt lying in bed, neuro stable, unchanged, still has right hemiparesis. BP stable. No afib on tele.   OBJECTIVE  CBC    Component Value Date/Time   WBC 2.9 (L) 08/13/2023 0448   RBC 4.48 08/13/2023 0448   HGB 15.5 08/13/2023 0448   HGB 15.5 06/08/2023 1147   HGB 15.4 10/24/2010 0927   HCT 43.9 08/13/2023 0448   HCT 46.2 06/08/2023 1147   HCT 43.9 10/24/2010 0927   PLT 138 (L) 08/13/2023 0448   PLT 174 06/08/2023 1147   MCV 98.0 08/13/2023 0448   MCV 104 (H) 06/08/2023 1147   MCV 99.7 (H) 10/24/2010 0927   MCH 34.6 (H) 08/13/2023 0448   MCHC 35.3 08/13/2023 0448   RDW 13.0 08/13/2023 0448   RDW 13.5 06/08/2023 1147   RDW 12.8 10/24/2010 0927   LYMPHSABS 1.0 08/11/2023 1417   LYMPHSABS 1.0 10/24/2010 0927   MONOABS 0.8 08/11/2023 1417   MONOABS 0.5 10/24/2010 0927   EOSABS 0.0 08/11/2023 1417   EOSABS 0.1 10/24/2010 0927   BASOSABS 0.0 08/11/2023 1417   BASOSABS 0.0 10/24/2010 0927    BMET    Component Value Date/Time   NA  133 (L) 08/13/2023 0448   NA 140 06/08/2023 1147   K 3.8 08/13/2023 0448   CL 100 08/13/2023 0448   CO2 23 08/13/2023 0448   GLUCOSE 108 (H) 08/13/2023 0448   BUN 18 08/13/2023 0448   BUN 14 06/08/2023 1147   CREATININE 0.91 08/13/2023 0448   CREATININE 0.88 11/22/2015 1137   CALCIUM  9.1 08/13/2023 0448   EGFR 80 06/08/2023 1147   GFRNONAA >60 08/13/2023 0448    IMAGING past 24 hours No results found.   Vitals:   08/13/23 1000 08/13/23 1100 08/13/23 1200 08/13/23 1300  BP: (!) 148/76 134/70 (!) 153/77 124/79  Pulse: (!) 59 (!) 56 64 (!) 58  Resp: 18 16 (!) 21 20  Temp:   98.2 F (36.8 C)   TempSrc:   Oral   SpO2: 95% 93% 93% 92%  Weight:      Height:         PHYSICAL EXAM General:  Alert, well-nourished, well-developed patient in no acute distress Psych:  Mood and affect appropriate for situation CV: Regular rate and rhythm on monitor, SR to SB Respiratory:  Regular, unlabored respirations on room air   NEURO:  Mental Status: AA&Ox3, patient is able to give clear and coherent history Speech/Language: speech is without dysarthria or aphasia.  Naming,  repetition, fluency, and comprehension intact.  Cranial Nerves:  II: PERRL. Visual fields full.  III, IV, VI: EOMI. Eyelids elevate symmetrically.  V: Sensation is intact to light touch and symmetrical to face.  VII: right facial droop, mild VIII: hearing intact to voice. IX, X: Palate elevates symmetrically. Phonation is normal.  KP:Dynloizm shrug 5/5. XII: tongue is midline without fasciculations. Motor: 5/5 LUE, LLE no drifts RUE: 3-/5 deltoid, 3/5 bicep, 4-/5 grip. RLE: 3/5 proximal, 0/5 distal toe movement Tone: is normal and bulk is normal Sensation- symmetrical bilaterally, no sensory neglect   Coordination: unable to perform on right. No tremor or overt ataxia noted.  Gait- deferred  Most Recent NIH: 6    ASSESSMENT/PLAN  Left BG ICH with IVH, etiology: Likely hypertensive in the setting of  eliquis  use Code Stroke CT head - Left basal ganglia IPH, measuring 1.8 x 2.0 x 1.1 cm with intraventricular extension CTA head & neck w/Perfusion, Spot sign within the left basal ganglia hematoma. No aneurysm, vascular malformation or acute LVO identified. 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 MRI w/wo No substantial change in IPH in left basal ganglia.  No progressive mass effect CTH No progression of left basal ganglia hemorrhage with intraventricular extension Lateral ventricular clot. No hydrocephalus 2D Echo: LVEF 60 to 65% no LVH LDL 117 HgbA1c 5.6 VTE prophylaxis - SCDs Eliquis  BID  prior to admission, now on No antithrombotic due to ICH. Pt will follow up with Dr. Rosemarie as outpt to consider ASPIRE trial if interested, or restart eliquis  in 2 weeks if stable, or follow up with cardiology for other options including Watchman device. Therapy recommendations:  CIR Disposition:  pending  Atrial Fibrillation Atrial Flutter Home Meds: Amiodarone  200 mg, Eliquis  5 mg twice daily, Lopressor  25 mg twice daily Lopressor  restarted Ablation performed 06/19/23 Loop Recorder placed 06/19/23 post A-fib ablation Now in NSR Sees Dr. Shlomo cardiology for outpatient Pt will follow up with Dr. Rosemarie as outpt to consider ASPIRE trial if interested, or restart eliquis  in 2 weeks if stable, or follow up with cardiology for other options including Watchman device.  Left ICA occlusion, chronic Carotid Doppler 07/09/23: Evidence consistent with total occlusion of the left ICA. Also found to have right subclavian artery stenosis. He was referred to Dr. Gretta with vascular surgery and has appointment later this month. CTA head and neck showed chronic left ICA occlusion but no subclavian artery stenosis.   Hypertension Home meds:  Lopressor  50mg , restarted Weaning clieviprex gtt Blood Pressure Goal: SBP less than 160  Long term BP goal  normotensive  Hyperlipidemia Home meds:  Lipitor  40mg  LDL 117, goal < 70 On Lipitor  80mg  now  Continue statin on discharge  Tobacco Abuse Patient smokes 0.5 packs per day        Ready to quit? Yes Cessation education provided  Other Stroke Risk Factors ETOH use, alcohol level <10, advised to drink no more than 1-2 drink(s) a day Coronary artery disease  Other active problems Leukopenia, appear chronic Labs in 2021 first showing WBC 2.3->2.7 Hypothyroidism Restart home [facility] day # 2  Neurology will sign off. Please call with questions. Pt will follow up with stroke clinic Dr. Rosemarie at [facility] in about 3-4 weeks. Thanks for the consult.   Ary Cummins, MD PhD Stroke Neurology 08/13/2023 1:11 PM  To contact Stroke Continuity provider, please refer to [email or web address]. After hours, contact General Neurology

## 2023-08-13 NOTE — Progress Notes (Addendum)
 Physical Therapy Treatment Patient Details Name: Adrian [name] MRN: [id number] DOB: May 22, 1956 Today's Date: 08/13/2023   History of Present Illness The pt is a 68 yo male presenting 1/4 with R sided weakness. CT showed a large Left basal ganglia ICH with IVH extension. PMH includes:  A-fib, carotid artery stenosis, CAD, tonsil cancer, hypothyroidism, L MCA CVA.    PT Comments  Pt progressed well toward goals today.  Emphasis on rolling and transition side to sitting with min/mod assist, scooting symmetrically, and completing 4 trials of pre gait over 2+ min each with mod assist of 1-2 persons incl w/shifts/unweighting, step Forward/back, side stepping L/R, knee control activity including squats.     If plan is discharge home, recommend the following: Two people to help with walking and/or transfers;Two people to help with bathing/dressing/bathroom;Assistance with cooking/housework;Assistance with feeding;Direct supervision/assist for medications management;Direct supervision/assist for financial management;Help with stairs or ramp for entrance   Can travel by private vehicle        Equipment Recommendations  Other (comment)    Recommendations for Other Services Rehab consult     Precautions / Restrictions Precautions Precautions: Fall Precaution Comments: R hemi     Mobility  Bed Mobility Overal bed mobility: Needs Assistance Bed Mobility: Rolling, Sidelying to Sit Rolling: Min assist Sidelying to sit: Mod assist, +2 for safety/equipment (flat HOB)       General bed mobility comments: cues for sequencing--hooklying to normal roll to up via L elbow/UE.  Light mod to coordinate up to midline.  min stability while pt scoot symmetrically forward/back.    Transfers Overall transfer level: Needs assistance Equipment used: None (chairback) Transfers: Sit to/from Stand Sit to Stand: Min assist, +2 safety/equipment           General transfer comment: VC for technique and  sequencing provided. Physical assist to weight shift in order to move RLE.    Ambulation/Gait             Pre-gait activities: 4 trials of pregait  2+ min each.  1st with w/shifting, knee control, locking unlocking,  2nd--w/shift, unload, step forward R LE, translate forward/back step with assist avoiding drag back, 2-3 trials, 10 reps of controlled squats with,  3rd  knee control, step forward/back and transition to 3 steps L and 3 steps R with control assist and normalized sequencing.  4th all 4 activities. General Gait Details: NT in lieu of more Pre-gait.   Stairs             Wheelchair Mobility     Tilt Bed    Modified Rankin (Stroke Patients Only) Modified Rankin (Stroke Patients Only) Pre-Morbid Rankin Score: No significant disability Modified Rankin: Severe disability     Balance Overall balance assessment: Needs assistance, History of Falls Sitting-balance support: No upper extremity supported, Single extremity supported, Feet supported Sitting balance-Leahy Scale: Fair Sitting balance - Comments: sitting EOB     Standing balance-Leahy Scale: Poor Standing balance comment: trunk, shd girdle, pelvis and R knee work and controlled with min to mod assist at given times.                            Cognition Arousal: Alert Behavior During Therapy: WFL for tasks assessed/performed Overall Cognitive Status:  (NT formally, followed 1-2 step commands, good focus on task.)  Exercises Other Exercises Other Exercises: hip/knee control exercises in flex/ext/IR/ER with graded assist and graded resistance. x5-7 reps    General Comments General comments (skin integrity, edema, etc.): vss      Pertinent Vitals/Pain Pain Assessment Pain Assessment: No/denies pain    Home Living     Available Help at Discharge: Family;Available 24 hours/day Type of Home: House Home Access: Stairs to  enter Entrance Stairs-Rails: Right Entrance Stairs-Number of Steps: 2   Home Layout: One level        Prior Function            PT Goals (current goals can now be found in the care plan section) Acute Rehab PT Goals Patient Stated Goal: return to independence PT Goal Formulation: With patient/family Time For Goal Achievement: 08/26/23 Potential to Achieve Goals: Good Progress towards PT goals: Progressing toward goals    Frequency    Min 1X/week      PT Plan      Co-evaluation              AM-PAC PT 6 Clicks Mobility   Outcome Measure  Help needed turning from your back to your side while in a flat bed without using bedrails?: A Little Help needed moving from lying on your back to sitting on the side of a flat bed without using bedrails?: A Little Help needed moving to and from a bed to a chair (including a wheelchair)?: A Lot Help needed standing up from a chair using your arms (e.g., wheelchair or bedside chair)?: A Lot Help needed to walk in hospital room?: Total Help needed climbing 3-5 steps with a railing? : Total 6 Click Score: 12    End of Session   Activity Tolerance: Patient tolerated treatment well;Patient limited by fatigue (worked toward muscle fatigue) Patient left: in bed;with call bell/phone within reach Nurse Communication: Mobility status PT Visit Diagnosis: Unsteadiness on feet (R26.81);Hemiplegia and hemiparesis Hemiplegia - Right/Left: Right Hemiplegia - dominant/non-dominant: Dominant Hemiplegia - caused by: Nontraumatic intracerebral hemorrhage     Time: 8482-8450 PT Time Calculation (min) (ACUTE ONLY): 32 min  Charges:    $Therapeutic Activity: 8-22 mins $Neuromuscular Re-education: 8-22 mins PT General Charges $$ ACUTE PT VISIT: 1 Visit                     08/13/2023  Adrian [name]., PT Acute Rehabilitation Services [phone]  (office)   Adrian [name] 08/13/2023, 4:15 PM

## 2023-08-13 NOTE — Progress Notes (Signed)
 PROGRESS NOTE    Adrian [name]  FMW:[id number] DOB: 1956-05-17 DOA: 08/11/2023 PCP: Micheal Wolm ORN, MD    Brief Narrative:  68 year old with history of A-fib on Eliquis , carotid artery stenosis, hypothyroidism presented on 1/4 with acute onset of right-sided weakness.  He was hemiplegic on the right side, hypertensive with blood pressure 230 with EMS.  CT scan showed large basal ganglia intracranial hemorrhage with IVH extension.  He was admitted to neuro ICU, treated with Cleviprex .  Ultimately transferred out of ICU on 1/6.  Subjective: Patient seen and examined.  Denies any complaints.  He thinks he has some of the strength coming back.  Sinus rhythm on the telemetry.  Waiting for rehab assessment. Assessment & Plan:   Acute hemorrhagic stroke, left basal ganglia intracranial hemorrhage with IVH.  Likely hypertensive in the setting of Eliquis  use: Clinical findings, acute onset right hemiplegia and dysarthria. CT head findings, left basal ganglia hemorrhage 1.8 x 2 x 1.1 cm.  Intraventricular extension. MRI of the brain, similar to CT scan.  No progressive mass effect. CT angiogram head and neck with perfusion, no aneurysm or vascular malformation.  Chronic occlusion of proximal left ICA. Repeat CT scan with stable size hematoma.  No hydrocephalus. 2D echocardiogram, normal ejection fraction. Antiplatelet therapy, on Eliquis  at home.  Currently off of any anticoagulation or antiplatelets. LDL 117. Hemoglobin A1c, 5.6. CIR when bed available.  Paroxysmal A-fib: Patient currently rate controlled in sinus rhythm.  Patient is on amiodarone  200 mg daily, Eliquis  5 mg twice daily, Lopressor  25 mg twice daily at home.  Resume Lopressor .  Patient is status post ablation and loop recorder placed.  Off anticoagulation.  Outpatient follow-up with cardiology.  Essential hypertension: As above.  Now on blood pressure. Hyperlipidemia: LDL 117.  On Lipitor  80 mg. Hypothyroidism: Resume home  Synthroid .  Smoker: Counseled to quit.       DVT prophylaxis: heparin  injection 5,000 Units Start: 08/13/23 1400 SCD's Start: 08/11/23 1425   Code Status: Full code Family Communication: None at the bedside Disposition Plan: Status is: Inpatient Remains inpatient appropriate because: Waiting for rehab     Consultants:  Neurology  Procedures:  None  Antimicrobials:  None     Objective: Vitals:   08/13/23 1000 08/13/23 1100 08/13/23 1200 08/13/23 1300  BP: (!) 148/76 134/70 (!) 153/77 124/79  Pulse: (!) 59 (!) 56 64 (!) 58  Resp: 18 16 (!) 21 20  Temp:   98.2 F (36.8 C)   TempSrc:   Oral   SpO2: 95% 93% 93% 92%  Weight:      Height:        Intake/Output Summary (Last 24 hours) at 08/13/2023 1342 Last data filed at 08/13/2023 0800 Gross per 24 hour  Intake --  Output 750 ml  Net -750 ml   Filed Weights   08/11/23 1454  Weight: 63.5 kg    Examination:  General exam: Appears calm and comfortable  Respiratory system: Clear to auscultation. Respiratory effort normal. Cardiovascular system: S1 & S2 heard, RRR.  Gastrointestinal system: Abdomen is nondistended, soft and nontender. No organomegaly or masses felt. Normal bowel sounds heard. Central nervous system: Alert and oriented. Speech is clear. Has mild right-sided facial droop. Left upper and lower extremities with normal power and sensation. Right upper extremity with decreased power, can use proximal muscles against gravity.  Distal muscles 3/5. Right lower extremity with decreased power, 3 x 5 proximal muscle group.  0/5 distal muscles.    Data Reviewed:  I have personally reviewed following labs and imaging studies  CBC: Recent Labs  Lab 08/11/23 1417 08/11/23 1438 08/12/23 0428 08/13/23 0448  WBC 2.3*  --  2.7* 2.9*  NEUTROABS 0.5*  --   --   --   HGB 14.4 16.7 15.8 15.5  HCT 41.7 49.0 44.0 43.9  MCV 99.5  --  96.1 98.0  PLT 132*  --  152 138*   Basic Metabolic Panel: Recent Labs   Lab 08/11/23 1417 08/11/23 1438 08/12/23 0428 08/13/23 0448  NA 132* 135 133* 133*  K 4.4 5.1 3.8 3.8  CL 98 103 99 100  CO2 23  --  23 23  GLUCOSE 102* 97 109* 108*  BUN 16 23 11 18   CREATININE 0.86 0.70 0.73 0.91  CALCIUM  9.0  --  9.2 9.1   GFR: Estimated Creatinine Clearance: 70.7 mL/min (by C-G formula based on SCr of 0.91 mg/dL). Liver Function Tests: Recent Labs  Lab 08/11/23 1417  AST 25  ALT 23  ALKPHOS 56  BILITOT 0.8  PROT 6.6  ALBUMIN 3.5   No results for input(s): LIPASE, AMYLASE in the last 168 hours. No results for input(s): AMMONIA in the last 168 hours. Coagulation Profile: Recent Labs  Lab 08/11/23 1417  INR 1.1   Cardiac Enzymes: No results for input(s): CKTOTAL, CKMB, CKMBINDEX, TROPONINI in the last 168 hours. BNP (last 3 results) No results for input(s): PROBNP in the last 8760 hours. HbA1C: Recent Labs    08/12/23 0428  HGBA1C 5.6   CBG: Recent Labs  Lab 08/11/23 1446  GLUCAP 105*   Lipid Profile: Recent Labs    08/12/23 0428  CHOL 199  HDL 56  LDLCALC 117*  TRIG 128  CHOLHDL 3.6   Thyroid  Function Tests: No results for input(s): TSH, T4TOTAL, FREET4, T3FREE, THYROIDAB in the last 72 hours. Anemia Panel: No results for input(s): VITAMINB12, FOLATE, FERRITIN, TIBC, IRON, RETICCTPCT in the last 72 hours. Sepsis Labs: No results for input(s): PROCALCITON, LATICACIDVEN in the last 168 hours.  Recent Results (from the past 240 hours)  MRSA Next Gen by PCR, Nasal     Status: None   Collection Time: 08/11/23  4:00 PM   Specimen: Nasal Mucosa; Nasal Swab  Result Value Ref Range Status   MRSA by PCR Next Gen NOT DETECTED NOT DETECTED Final    Comment: (NOTE) The GeneXpert MRSA Assay (FDA approved for NASAL specimens only), is one component of a comprehensive MRSA colonization surveillance program. It is not intended to diagnose MRSA infection nor to guide or monitor treatment for  MRSA infections. Test performance is not FDA approved in patients less than 13 years old. Performed at [facility], [address], [place], [place] [address]          Radiology Studies: CT HEAD WO CONTRAST Result Date: 08/12/2023 CLINICAL DATA:  Stroke follow-up EXAM: CT HEAD WITHOUT CONTRAST TECHNIQUE: Contiguous axial images were obtained from the base of the skull through the vertex without intravenous contrast. RADIATION DOSE REDUCTION: This exam was performed according to the departmental dose-optimization program which includes automated exposure control, adjustment of the mA and/or kV according to patient size and/or use of iterative reconstruction technique. COMPARISON:  Brain MRI from yesterday FINDINGS: Brain: Some Collison and retraction of blood products in the left lateral ventricle, no evidence of progressive bleeding. The parenchymal hemorrhage centered at the upper left basal ganglia and corona radiata is stable in size at up to 2 cm span on  axial images. Chronic infarct at the left insula and frontotemporal operculum. Vascular: No acute finding Skull: Normal. Negative for fracture or focal lesion. Sinuses/Orbits: No acute finding. IMPRESSION: No progression of left basal ganglia hemorrhage with intraventricular extension. Lateral ventricular clot. No hydrocephalus. Electronically Signed   By: Dorn Roulette M.D.   On: 08/12/2023 07:25   MR BRAIN W WO CONTRAST Result Date: 08/11/2023 CLINICAL DATA:  Stroke, follow up Neuro deficit, acute, stroke suspected EXAM: MRI HEAD WITHOUT AND WITH CONTRAST TECHNIQUE: Multiplanar, multiecho pulse sequences of the brain and surrounding structures were obtained without and with intravenous contrast. CONTRAST:  6.5mL GADAVIST  GADOBUTROL  1 MMOL/ML IV SOLN COMPARISON:  CT head from today. FINDINGS: Brain: When comparing across modalities, no substantial change in intraparenchymal hemorrhage in the left basal ganglia with intraventricular  extension. No visible surrounding acute hemorrhage or mass lesion; however, acute blood products limits assessment. Prior left frontal infarct with encephalomalacia. No hydrocephalus. No pathologic enhancement. Vascular: Major arterial flow voids are maintained at the skull base. Skull and upper cervical spine: Normal marrow signal. Sinuses/Orbits: Right frontal sinus opacification. Remaining sinuses are clear. No acute orbital findings. Other: No mastoid effusions. IMPRESSION: 1. When comparing across modalities, no substantial change in intraparenchymal hemorrhage in the left basal ganglia with intraventricular extension. No progressive mass effect. 2. No visible surrounding acute hemorrhage or mass lesion; however, acute blood products limits assessment Electronically Signed   By: Gilmore GORMAN Molt M.D.   On: 08/11/2023 20:38   CT ANGIO HEAD NECK W WO CM (CODE STROKE) Result Date: 08/11/2023 CLINICAL DATA:  Neuro deficit, acute, stroke suspected. Acute right-sided weakness. EXAM: CT ANGIOGRAPHY HEAD AND NECK WITH AND WITHOUT CONTRAST TECHNIQUE: Multidetector CT imaging of the head and neck was performed using the standard protocol during bolus administration of intravenous contrast. Multiplanar CT image reconstructions and MIPs were obtained to evaluate the vascular anatomy. Carotid stenosis measurements (when applicable) are obtained utilizing NASCET criteria, using the distal internal carotid diameter as the denominator. RADIATION DOSE REDUCTION: This exam was performed according to the departmental dose-optimization program which includes automated exposure control, adjustment of the mA and/or kV according to patient size and/or use of iterative reconstruction technique. CONTRAST:  75mL OMNIPAQUE  IOHEXOL  350 MG/ML SOLN COMPARISON:  CTA head and neck 03/08/2019 FINDINGS: CTA NECK FINDINGS Aortic arch: Normal variant 4 vessel aortic arch with the left vertebral artery arising directly from the arch. No  significant stenosis of the arch vessel origins. Right carotid system: Patent with a small amount of calcified plaque at the carotid bifurcation and in the proximal ICA. No evidence of a significant stenosis or dissection. Left carotid system: The common carotid artery is patent. There is prominent calcified and soft plaque at the carotid bifurcation with chronic occlusion of the ICA at the bulb without reconstitution in the neck. Vertebral arteries: Patent and codominant without evidence of a significant stenosis or dissection. Skeleton: Solid C5-6 ACDF. Advanced disc degeneration at C3-4 and C6-7 with asymmetric right neural foraminal stenosis at these levels. Asymmetrically advanced left facet arthrosis in the upper cervical spine. Other neck: No evidence of cervical lymphadenopathy or mass. Upper chest: Biapical pleuroparenchymal lung scarring. Review of the MIP images confirms the above findings CTA HEAD FINDINGS Anterior circulation: The intracranial right ICA is patent with mild atherosclerosis not resulting in significant stenosis. There is reconstitution of the left ICA beginning in the horizontal petrous segment with the vessel remaining grossly patent though irregularly narrowed distally through the terminus, with a particularly thready appearance  in the distal petrous and proximal cavernous segments. ACAs and MCAs are patent without evidence of a significant A1 or M1 stenosis. There is attenuation of left MCA branch vessels in the setting of a chronic infarct. Two adjacent punctate foci of contrast are present in the medial aspect of the acute left basal ganglia hemorrhage suggestive of contrast extravasation and possible ongoing bleeding. No aneurysm or vascular malformation is identified. Posterior circulation: The intracranial vertebral arteries are widely patent to the basilar. The basilar artery is widely patent. Posterior communicating arteries are diminutive or absent. The PCAs are patent with the  right PCA appearing mildly smaller than the left diffusely but without evidence of a flow limiting proximal stenosis. No aneurysm is identified. Venous sinuses: Patent. Anatomic variants: None. Review of the MIP images confirms the above findings These results were communicated to Dr. Vanessa at 2:54 pm on 08/11/2023 by text page via the [facility] messaging system. IMPRESSION: 1. CTA spot sign/punctate foci of contrast extravasation within the left basal ganglia hematoma. 2. No aneurysm, vascular malformation, or acute large vessel occlusion identified. 3. 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 Electronically Signed   By: Dasie Hamburg M.D.   On: 08/11/2023 15:08   CT HEAD CODE STROKE WO CONTRAST Result Date: 08/11/2023 CLINICAL DATA:  Code stroke.  Neuro deficit, acute, stroke suspected EXAM: CT HEAD WITHOUT CONTRAST TECHNIQUE: Contiguous axial images were obtained from the base of the skull through the vertex without intravenous contrast. RADIATION DOSE REDUCTION: This exam was performed according to the departmental dose-optimization program which includes automated exposure control, adjustment of the mA and/or kV according to patient size and/or use of iterative reconstruction technique. COMPARISON:  Head CT 06/08/2023 FINDINGS: Brain: 1.8 x 2.0 x 1.1 cm parenchymal hemorrhage centered in the left basal ganglia with intraventricular extension. There are layering blood products in the left occipital horn. No evidence of hydrocephalus at the time of exam. No significant mass effect or midline shift. There is chronic infarct involving the left frontal lobe and insular region. No extra-axial fluid collection. Vascular: No hyperdense vessel or unexpected calcification. Skull: Normal. Negative for fracture or focal lesion. Sinuses/Orbits: No middle ear or mastoid effusion. Paranasal sinuses are notable for mucosal thickening in the right frontal and anterior ethmoid air cells.  Orbits are unremarkable. Other: None ASPECTS ([place] Stroke Program Early CT Score) IMPRESSION: 1.8 x 2.0 x 1.1 cm parenchymal hemorrhage centered in the left basal ganglia with intraventricular extension. No evidence of hydrocephalus at the time of exam. No significant midline shift. Findings were paged to Dr. Vanessa on 08/11/23 at 2:29 PM Electronically Signed   By: Lyndall Gore M.D.   On: 08/11/2023 14:31        Scheduled Meds:  atorvastatin   80 mg Oral Daily   Chlorhexidine  Gluconate Cloth  6 each Topical Daily   heparin  injection (subcutaneous)  5,000 Units Subcutaneous Q8H   levothyroxine   125 mcg Oral Q0600   metoprolol  tartrate  50 mg Oral BID   senna-docusate  1 tablet Oral BID   sodium chloride  flush  3 mL Intravenous Once   Continuous Infusions:   LOS: 2 days    Time spent: 40 minutes    Renato Applebaum, MD [facility]

## 2023-08-14 ENCOUNTER — Other Ambulatory Visit: Payer: Self-pay

## 2023-08-14 ENCOUNTER — Inpatient Hospital Stay (HOSPITAL_COMMUNITY)
Admission: AD | Admit: 2023-08-14 | Discharge: 2023-09-05 | DRG: 057 | Disposition: A | Discharge status: Home Health | Payer: Medicare Other | Source: Intra-hospital | Attending: Physical Medicine & Rehabilitation | Admitting: Physical Medicine & Rehabilitation

## 2023-08-14 ENCOUNTER — Encounter (HOSPITAL_COMMUNITY): Payer: Self-pay | Admitting: Physical Medicine & Rehabilitation

## 2023-08-14 DIAGNOSIS — I69292 Facial weakness following other nontraumatic intracranial hemorrhage: Secondary | ICD-10-CM

## 2023-08-14 DIAGNOSIS — D696 Thrombocytopenia, unspecified: Secondary | ICD-10-CM | POA: Diagnosis present

## 2023-08-14 DIAGNOSIS — I480 Paroxysmal atrial fibrillation: Secondary | ICD-10-CM | POA: Diagnosis present

## 2023-08-14 DIAGNOSIS — R001 Bradycardia, unspecified: Secondary | ICD-10-CM | POA: Diagnosis not present

## 2023-08-14 DIAGNOSIS — D72819 Decreased white blood cell count, unspecified: Secondary | ICD-10-CM | POA: Diagnosis present

## 2023-08-14 DIAGNOSIS — F17210 Nicotine dependence, cigarettes, uncomplicated: Secondary | ICD-10-CM | POA: Diagnosis present

## 2023-08-14 DIAGNOSIS — Z9221 Personal history of antineoplastic chemotherapy: Secondary | ICD-10-CM | POA: Diagnosis not present

## 2023-08-14 DIAGNOSIS — Z7901 Long term (current) use of anticoagulants: Secondary | ICD-10-CM

## 2023-08-14 DIAGNOSIS — I69393 Ataxia following cerebral infarction: Secondary | ICD-10-CM | POA: Diagnosis not present

## 2023-08-14 DIAGNOSIS — G8191 Hemiplegia, unspecified affecting right dominant side: Secondary | ICD-10-CM

## 2023-08-14 DIAGNOSIS — I610 Nontraumatic intracerebral hemorrhage in hemisphere, subcortical: Secondary | ICD-10-CM | POA: Diagnosis present

## 2023-08-14 DIAGNOSIS — I69222 Dysarthria following other nontraumatic intracranial hemorrhage: Secondary | ICD-10-CM

## 2023-08-14 DIAGNOSIS — Z79899 Other long term (current) drug therapy: Secondary | ICD-10-CM

## 2023-08-14 DIAGNOSIS — I428 Other cardiomyopathies: Secondary | ICD-10-CM | POA: Diagnosis present

## 2023-08-14 DIAGNOSIS — I69320 Aphasia following cerebral infarction: Secondary | ICD-10-CM

## 2023-08-14 DIAGNOSIS — I482 Chronic atrial fibrillation, unspecified: Secondary | ICD-10-CM | POA: Diagnosis not present

## 2023-08-14 DIAGNOSIS — Z85818 Personal history of malignant neoplasm of other sites of lip, oral cavity, and pharynx: Secondary | ICD-10-CM | POA: Diagnosis not present

## 2023-08-14 DIAGNOSIS — E039 Hypothyroidism, unspecified: Secondary | ICD-10-CM | POA: Diagnosis present

## 2023-08-14 DIAGNOSIS — I10 Essential (primary) hypertension: Secondary | ICD-10-CM | POA: Diagnosis present

## 2023-08-14 DIAGNOSIS — E785 Hyperlipidemia, unspecified: Secondary | ICD-10-CM | POA: Diagnosis present

## 2023-08-14 DIAGNOSIS — Z79890 Hormone replacement therapy (postmenopausal): Secondary | ICD-10-CM | POA: Diagnosis not present

## 2023-08-14 DIAGNOSIS — I69251 Hemiplegia and hemiparesis following other nontraumatic intracranial hemorrhage affecting right dominant side: Secondary | ICD-10-CM | POA: Diagnosis not present

## 2023-08-14 DIAGNOSIS — I619 Nontraumatic intracerebral hemorrhage, unspecified: Principal | ICD-10-CM | POA: Diagnosis present

## 2023-08-14 DIAGNOSIS — Z720 Tobacco use: Secondary | ICD-10-CM | POA: Diagnosis not present

## 2023-08-14 LAB — CBC
HCT: 44.1 % (ref 39.0–52.0)
Hemoglobin: 15.5 g/dL (ref 13.0–17.0)
MCH: 34.8 pg — ABNORMAL HIGH (ref 26.0–34.0)
MCHC: 35.1 g/dL (ref 30.0–36.0)
MCV: 98.9 fL (ref 80.0–100.0)
Platelets: 131 10*3/uL — ABNORMAL LOW (ref 150–400)
RBC: 4.46 MIL/uL (ref 4.22–5.81)
RDW: 12.8 % (ref 11.5–15.5)
WBC: 3.1 10*3/uL — ABNORMAL LOW (ref 4.0–10.5)
nRBC: 0 % (ref 0.0–0.2)

## 2023-08-14 LAB — BASIC METABOLIC PANEL
Anion gap: 11 (ref 5–15)
BUN: 18 mg/dL (ref 8–23)
CO2: 26 mmol/L (ref 22–32)
Calcium: 9 mg/dL (ref 8.9–10.3)
Chloride: 98 mmol/L (ref 98–111)
Creatinine, Ser: 1.06 mg/dL (ref 0.61–1.24)
GFR, Estimated: >60 mL/min (ref >60)
Glucose, Bld: 102 mg/dL — ABNORMAL HIGH (ref 70–99)
Potassium: 4.1 mmol/L (ref 3.5–5.1)
Sodium: 135 mmol/L (ref 135–145)

## 2023-08-14 MED ORDER — AMIODARONE HCL 200 MG PO TABS
200.0000 mg | ORAL_TABLET | Freq: Every day | ORAL | Status: DC
  Administered 2023-08-14 – 2023-09-05 (×23): 200 mg via ORAL
  Filled 2023-08-14 (×23): qty 1

## 2023-08-14 MED ORDER — ACETAMINOPHEN 325 MG PO TABS
325.0000 mg | ORAL_TABLET | ORAL | Status: DC | PRN
  Administered 2023-08-15 – 2023-08-21 (×2): 650 mg via ORAL
  Administered 2023-08-22 – 2023-08-23 (×2): 325 mg via ORAL
  Administered 2023-08-24 – 2023-09-04 (×10): 650 mg via ORAL
  Filled 2023-08-14 (×14): qty 2

## 2023-08-14 MED ORDER — AMLODIPINE BESYLATE 2.5 MG PO TABS: 2.5000 mg | ORAL_TABLET | Freq: Every day | ORAL | Status: DC

## 2023-08-14 MED ORDER — ALUM & MAG HYDROXIDE-SIMETH 200-200-20 MG/5ML PO SUSP: 30.0000 mL | ORAL | Status: DC | PRN

## 2023-08-14 MED ORDER — LEVOTHYROXINE SODIUM 75 MCG PO TABS
125.0000 ug | ORAL_TABLET | Freq: Every day | ORAL | Status: DC
  Administered 2023-08-15 – 2023-09-05 (×22): 125 ug via ORAL
  Filled 2023-08-14 (×22): qty 1

## 2023-08-14 MED ORDER — HEPARIN SODIUM (PORCINE) 5000 UNIT/ML IJ SOLN
5000.0000 [IU] | Freq: Three times a day (TID) | INTRAMUSCULAR | Status: DC
  Administered 2023-08-14 – 2023-08-22 (×23): 5000 [IU] via SUBCUTANEOUS
  Filled 2023-08-14 (×23): qty 1

## 2023-08-14 MED ORDER — ONDANSETRON HCL 4 MG/2ML IJ SOLN: 4.0000 mg | Freq: Four times a day (QID) | INTRAMUSCULAR | Status: DC | PRN

## 2023-08-14 MED ORDER — ATORVASTATIN CALCIUM 80 MG PO TABS: 80.0000 mg | ORAL_TABLET | Freq: Every day | ORAL | Status: DC

## 2023-08-14 MED ORDER — HEPARIN SODIUM (PORCINE) 5000 UNIT/ML IJ SOLN: 5000.0000 [IU] | Freq: Three times a day (TID) | INTRAMUSCULAR | Status: DC

## 2023-08-14 MED ORDER — BISACODYL 5 MG PO TBEC: 5.0000 mg | DELAYED_RELEASE_TABLET | Freq: Every day | ORAL | Status: DC | PRN

## 2023-08-14 MED ORDER — SENNOSIDES-DOCUSATE SODIUM 8.6-50 MG PO TABS: 1.0000 | ORAL_TABLET | Freq: Two times a day (BID) | ORAL | Status: DC

## 2023-08-14 MED ORDER — ONDANSETRON HCL 4 MG PO TABS: 4.0000 mg | ORAL_TABLET | Freq: Four times a day (QID) | ORAL | Status: DC | PRN

## 2023-08-14 MED ORDER — SENNOSIDES-DOCUSATE SODIUM 8.6-50 MG PO TABS
1.0000 | ORAL_TABLET | Freq: Two times a day (BID) | ORAL | Status: DC
  Administered 2023-08-14 – 2023-09-05 (×44): 1 via ORAL
  Filled 2023-08-14 (×44): qty 1

## 2023-08-14 MED ORDER — METOPROLOL TARTRATE 50 MG PO TABS
50.0000 mg | ORAL_TABLET | Freq: Two times a day (BID) | ORAL | Status: DC
  Administered 2023-08-14 – 2023-08-16 (×3): 50 mg via ORAL
  Filled 2023-08-14 (×6): qty 1

## 2023-08-14 MED ORDER — GUAIFENESIN-DM 100-10 MG/5ML PO SYRP: 10.0000 mL | ORAL_SOLUTION | Freq: Four times a day (QID) | ORAL | Status: DC | PRN

## 2023-08-14 MED ORDER — METHOCARBAMOL 500 MG PO TABS
500.0000 mg | ORAL_TABLET | Freq: Four times a day (QID) | ORAL | Status: DC | PRN
  Administered 2023-08-21: 500 mg via ORAL
  Filled 2023-08-14: qty 1

## 2023-08-14 MED ORDER — AMLODIPINE BESYLATE 2.5 MG PO TABS
2.5000 mg | ORAL_TABLET | Freq: Every day | ORAL | Status: DC
  Administered 2023-08-15 – 2023-09-05 (×22): 2.5 mg via ORAL
  Filled 2023-08-14 (×22): qty 1

## 2023-08-14 MED ORDER — ATORVASTATIN CALCIUM 80 MG PO TABS
80.0000 mg | ORAL_TABLET | Freq: Every day | ORAL | Status: DC
  Administered 2023-08-15 – 2023-09-05 (×22): 80 mg via ORAL
  Filled 2023-08-14 (×22): qty 1

## 2023-08-14 MED ORDER — FLEET ENEMA RE ENEM: 1.0000 | ENEMA | Freq: Once | RECTAL | Status: DC | PRN

## 2023-08-14 NOTE — Progress Notes (Signed)
 PROGRESS NOTE    Adrian [name]  FMW:[id number] DOB: 01-26-1956 DOA: 08/11/2023 PCP: Micheal Wolm ORN, MD    Brief Narrative:  68 year old with history of A-fib on Eliquis , carotid artery stenosis, hypothyroidism presented on 1/4 with acute onset of right-sided weakness.  He was hemiplegic on the right side, hypertensive with blood pressure 230 with EMS.  CT scan showed large basal ganglia intracranial hemorrhage with IVH extension.  He was admitted to neuro ICU, treated with Cleviprex .  Ultimately transferred out of ICU on 1/6.  Subjective:  Patient seen and examined.  No overnight events.  He is very eager to go to rehab.   Assessment & Plan:   Acute hemorrhagic stroke, left basal ganglia intracranial hemorrhage with IVH.  Likely hypertensive in the setting of Eliquis  use: Clinical findings, acute onset right hemiplegia and dysarthria. CT head findings, left basal ganglia hemorrhage 1.8 x 2 x 1.1 cm.  Intraventricular extension. MRI of the brain, similar to CT scan.  No progressive mass effect. CT angiogram head and neck with perfusion, no aneurysm or vascular malformation.  Chronic occlusion of proximal left ICA. Repeat CT scan with stable size hematoma.  No hydrocephalus. 2D echocardiogram, normal ejection fraction. Antiplatelet therapy, on Eliquis  at home.  Currently off of any anticoagulation or antiplatelets. LDL 117. Hemoglobin A1c, 5.6. CIR when bed available.  Paroxysmal A-fib: Patient currently rate controlled in sinus rhythm.  Patient is on amiodarone  200 mg daily, Eliquis  5 mg twice daily, Lopressor  25 mg twice daily at home.  Resume Lopressor .  Patient is status post ablation and loop recorder placed.  Off anticoagulation.  Outpatient follow-up with cardiology.  Essential hypertension: As above.  Blood pressure is better today.  On blood pressure only. Hyperlipidemia: LDL 117.  On Lipitor  80 mg. Hypothyroidism: Resumed home Synthroid .  Smoker: Counseled to quit.     Patient waiting to go to CIR.  He can be transferred to MedSurg bed.     DVT prophylaxis: heparin  injection 5,000 Units Start: 08/13/23 1400 SCD's Start: 08/11/23 1425   Code Status: Full code Family Communication: None at the bedside Disposition Plan: Status is: Inpatient Remains inpatient appropriate because: Waiting for rehab     Consultants:  Neurology  Procedures:  None  Antimicrobials:  None     Objective: Vitals:   08/14/23 0000 08/14/23 0400 08/14/23 0800 08/14/23 1200  BP: 110/69 (!) 106/92 117/68   Pulse: 61 (!) 52 (!) 53   Resp: 17 20 14    Temp: 98.1 F (36.7 C) 98.3 F (36.8 C) 97.7 F (36.5 C) (!) 97.1 F (36.2 C)  TempSrc: Oral Oral Oral Axillary  SpO2: 92% 90% 96%   Weight:      Height:        Intake/Output Summary (Last 24 hours) at 08/14/2023 1302 Last data filed at 08/14/2023 0600 Gross per 24 hour  Intake 120 ml  Output 675 ml  Net -555 ml   Filed Weights   08/11/23 1454  Weight: 63.5 kg    Examination:  General exam: Appears calm and comfortable  Gastrointestinal system: Abdomen is nondistended, soft and nontender. No organomegaly or masses felt. Normal bowel sounds heard. Central nervous system: Alert and oriented. Speech is clear. Has mild right-sided facial droop. Left upper and lower extremities with normal power and sensation. Right upper extremity with decreased power, can use proximal muscles against gravity.  Distal muscles 3/5. Right lower extremity with decreased power, 3 x 5 proximal muscle group.  0/5 distal muscles.  Data Reviewed: I have personally reviewed following labs and imaging studies  CBC: Recent Labs  Lab 08/11/23 1417 08/11/23 1438 08/12/23 0428 08/13/23 0448 08/14/23 0552  WBC 2.3*  --  2.7* 2.9* 3.1*  NEUTROABS 0.5*  --   --   --   --   HGB 14.4 16.7 15.8 15.5 15.5  HCT 41.7 49.0 44.0 43.9 44.1  MCV 99.5  --  96.1 98.0 98.9  PLT 132*  --  152 138* 131*   Basic Metabolic  Panel: Recent Labs  Lab 08/11/23 1417 08/11/23 1438 08/12/23 0428 08/13/23 0448 08/14/23 0552  NA 132* 135 133* 133* 135  K 4.4 5.1 3.8 3.8 4.1  CL 98 103 99 100 98  CO2 23  --  23 23 26   GLUCOSE 102* 97 109* 108* 102*  BUN 16 23 11 18 18   CREATININE 0.86 0.70 0.73 0.91 1.06  CALCIUM  9.0  --  9.2 9.1 9.0   GFR: Estimated Creatinine Clearance: 60.7 mL/min (by C-G formula based on SCr of 1.06 mg/dL). Liver Function Tests: Recent Labs  Lab 08/11/23 1417  AST 25  ALT 23  ALKPHOS 56  BILITOT 0.8  PROT 6.6  ALBUMIN 3.5   No results for input(s): LIPASE, AMYLASE in the last 168 hours. No results for input(s): AMMONIA in the last 168 hours. Coagulation Profile: Recent Labs  Lab 08/11/23 1417  INR 1.1   Cardiac Enzymes: No results for input(s): CKTOTAL, CKMB, CKMBINDEX, TROPONINI in the last 168 hours. BNP (last 3 results) No results for input(s): PROBNP in the last 8760 hours. HbA1C: Recent Labs    08/12/23 0428  HGBA1C 5.6   CBG: Recent Labs  Lab 08/11/23 1446  GLUCAP 105*   Lipid Profile: Recent Labs    08/12/23 0428  CHOL 199  HDL 56  LDLCALC 117*  TRIG 128  CHOLHDL 3.6   Thyroid  Function Tests: No results for input(s): TSH, T4TOTAL, FREET4, T3FREE, THYROIDAB in the last 72 hours. Anemia Panel: No results for input(s): VITAMINB12, FOLATE, FERRITIN, TIBC, IRON, RETICCTPCT in the last 72 hours. Sepsis Labs: No results for input(s): PROCALCITON, LATICACIDVEN in the last 168 hours.  Recent Results (from the past 240 hours)  MRSA Next Gen by PCR, Nasal     Status: None   Collection Time: 08/11/23  4:00 PM   Specimen: Nasal Mucosa; Nasal Swab  Result Value Ref Range Status   MRSA by PCR Next Gen NOT DETECTED NOT DETECTED Final    Comment: (NOTE) The GeneXpert MRSA Assay (FDA approved for NASAL specimens only), is one component of a comprehensive MRSA colonization surveillance program. It is not intended to  diagnose MRSA infection nor to guide or monitor treatment for MRSA infections. Test performance is not FDA approved in patients less than 45 years old. Performed at [facility], [address] [place], [place] [address]          Radiology Studies: No results found.       Scheduled Meds:  atorvastatin   80 mg Oral Daily   Chlorhexidine  Gluconate Cloth  6 each Topical Daily   heparin  injection (subcutaneous)  5,000 Units Subcutaneous Q8H   levothyroxine   125 mcg Oral Q0600   metoprolol  tartrate  50 mg Oral BID   senna-docusate  1 tablet Oral BID   sodium chloride  flush  3 mL Intravenous Once   Continuous Infusions:   LOS: 3 days    Time spent: 40 minutes    Renato Applebaum, MD [facility]

## 2023-08-14 NOTE — TOC CM/SW Note (Signed)
 Transition of Care [facility]) - Inpatient Brief Assessment   Patient Details  Name: Adrian [name] MRN: [id number] Date of Birth: 10-02-1955  [facility] CM/SW Contact:    Inocente GORMAN Kindle, LCSW Phone Number: 08/14/2023, 4:18 PM   Clinical Narrative: Patient discharging to CIR. No further needs identified.    Transition of Care Asessment:

## 2023-08-14 NOTE — PMR Pre-admission (Addendum)
 PMR Admission Coordinator Pre-Admission Assessment  Patient: Adrian [name] is an 68 y.o., male MRN: [id number] DOB: [birth year] Height: 5' 8 (172.7 cm) Weight: 63.5 kg              Insurance Information HMO:     PPO: yes     PCP:      IPA:      80/20:      OTHER:  PRIMARY: [employer] [place]       Policy#: [id number]      Subscriber: pt CM Name: via portal approval      Phone#: [phone]     Fax#: [phone] Pre-Cert#: [id number]  approved 1/7 until 1/21     Employer: retired [employer] Benefits:  Phone #: [phone]     Name: 1/6 Eff. Date: 01/05/2013 until 08/06/9998     Deduct: $100      Out of Pocket Max: $1000      Life Max: none  CIR: 80%      SNF: 80% Outpatient: 80%     Co-Pay:  Home Health: 80%      Co-Pay:  DME: 80%     Co-Pay:  Providers: in network  SECONDARY: Medicare a and b      Policy#: [id number]      active 05/08/23 per passport one source online  THIRD: AARP medicare supplement policy # [id number]  Financial Counselor:       Phone#:   The "Data Collection Information Summary" for patients in Inpatient Rehabilitation Facilities with attached "Privacy Act Statement-Health Care Records" was provided and verbally reviewed with: Patient and Family  Emergency Contact Information Contact Information     Name Relation Home Work Mobile   [place] Spouse [phone]  [phone]   [place]ill, Grimsley Daughter [phone]        Other Contacts     Name Relation Home Work Mobile   Glazebrook,Ben Son [phone]        Current Medical History  Patient Admitting Diagnosis: ICH  History of Present Illness: Adrian [name] is a 68 y.o. male with a history of atrial fibrillation on Eliquis , hypothyroidism and carotid artery stenosis who presented on 08/11/2023 with right sided weakness.   Patient was hypertensive upon arrival of EMS.  CT of the head demonstrated large basal ganglia intracranial hemorrhage with intraventricular hemorrhage extension.  CTA of the head and neck  demonstrated spot sign with left basal ganglia hematoma.  No aneurysms, malformations or large vessel occlusions were identified.  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  Patient's Eliquis  was held and blood pressure was controlled. On Eliquis  prior to admit. Now on no antithrombotic due to ICH. Will follow up as OP with Dr Rosemarie to consider ASPIRE trail if interested or restart ELiquis  in 2 weeks if stable, or follow up with cardiology for other options including Watchman device.   Lopressor  as at home med restarted. Ablation recently 06/19/23. LOOP recorder recently placed 06/19/23. Has been referred to Dr Gretta with vascular surgery due to evidence of consistent total occlusion of the left ICA 07/09/23. Home Lipitor  increased from 40 mg to 80 with LDL 117. On home synthroid  .  Complete NIHSS TOTAL: 7 Glasgow Coma Scale Score: 15  Patient's medical record from [facility] has been reviewed by the rehabilitation admission coordinator and physician.  Past Medical History  Past Medical History:  Diagnosis Date   AF (paroxysmal atrial fibrillation) (HCC) 11/29/2018   Atrial flutter (HCC) 10/12/2015   a. TEE  3/17 with ? LAA clot-->s/p TEE/DCCV 11/24/2015; s/p DCCV 12/2022;s/p ablation 06/2023   CAD (coronary artery disease), native coronary artery 12/06/2015   a. 10/2015 MV: EF  37%, reversible defect inferior apex, intermediate risk findings; b. 10/2015 Cath: 20% mid RCA.   Carotid artery stenosis    1-39% right ICA stenosis and occluded left ICA   Colonic diverticular abscess    Diverticulitis    History of chemotherapy 2005   Cisplatin   Hypothyroidism    NICM (nonischemic cardiomyopathy) (HCC) 10/14/2015   a. Tachy mediated?;  b. Echo 3/17 - Mild concentric LVH, EF 30-35%, anteroseptal, anterior, anterolateral, apical anterior, lateral hypokinesis, trivial MR, mild to moderately reduced RVSF; c. LHC 3/17 - mRCA 20%   Radiation  NOv.3,2005-Dec. 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) 2005   Dr Hughes.  XRT   Has the patient had major surgery during 100 days prior to admission? yes  Family History  family history includes Cancer in his brother; Colon cancer in his father; Prostate cancer in his father; Skin cancer in his mother.  Current Medications   Current Facility-Administered Medications:    acetaminophen  (TYLENOL ) tablet 650 mg, 650 mg, Oral, Q4H PRN, 650 mg at 08/13/23 2234 **OR** acetaminophen  (TYLENOL ) 160 MG/5ML solution 650 mg, 650 mg, Per Tube, Q4H PRN **OR** acetaminophen  (TYLENOL ) suppository 650 mg, 650 mg, Rectal, Q4H PRN, Khaliqdina, Salman, MD   [START ON 08/15/2023] amLODipine  (NORVASC ) tablet 2.5 mg, 2.5 mg, Oral, Daily, Ghimire, Kuber, MD   atorvastatin  (LIPITOR ) tablet 80 mg, 80 mg, Oral, Daily, Jerri Pfeiffer, MD, 80 mg at 08/14/23 9061   Chlorhexidine  Gluconate Cloth 2 % PADS 6 each, 6 each, Topical, Daily, Jerri Pfeiffer, MD, 6 each at 08/14/23 1412   heparin  injection 5,000 Units, 5,000 Units, Subcutaneous, Q8H, Jerri Pfeiffer, MD, 5,000 Units at 08/14/23 1412   levothyroxine  (SYNTHROID ) tablet 125 mcg, 125 mcg, Oral, Q0600, Judithe Longs C, NP, 125 mcg at 08/14/23 9380   metoprolol  tartrate (LOPRESSOR ) tablet 50 mg, 50 mg, Oral, BID, Lehner, Erin C, NP, 50 mg at 08/13/23 2237   Oral care mouth rinse, 15 mL, Mouth Rinse, PRN, Jerri Pfeiffer, MD   senna-docusate (Senokot-S) tablet 1 tablet, 1 tablet, Oral, BID, Khaliqdina, Salman, MD, 1 tablet at 08/13/23 2237   sodium chloride  flush (NS) 0.9 % injection 3 mL, 3 mL, Intravenous, Once, Towana Ozell BROCKS, MD  Patients Current Diet:  Diet Order             Diet - low sodium heart healthy           Diet Heart Room service appropriate? Yes; Fluid consistency: Thin  Diet effective now                  Precautions / Restrictions Precautions Precautions: Fall Precaution Comments: R hemi Restrictions Weight Bearing  Restrictions Per Provider Order: No   Has the patient had 2 or more falls or a fall with injury in the past year? yes  Prior Activity Level Community (5-7x/wk): independent, driving, retired 6 weeks prior to admit from [employer]  Prior Functional Level Prior Function Prior Level of Function : Independent/Modified Independent, Driving, History of Falls (last six months) Mobility Comments: no use of DME, independent living alone ADLs Comments: independent living alone  Self Care: Did the patient need help bathing, dressing, using the toilet or eating?  Independent  Indoor Mobility: Did the patient need assistance with walking from room to room (  with or without device)? Independent  Stairs: Did the patient need assistance with internal or external stairs (with or without device)? Independent  Functional Cognition: Did the patient need help planning regular tasks such as shopping or remembering to take medications? Independent  Patient Information Are you of Hispanic, Latino/a,or Spanish origin?: A. No, not of Hispanic, Latino/a, or Spanish origin What is your race?: A. White Do you need or want an interpreter to communicate with a doctor or health care staff?: 0. No  Patient's Response To:  Health Literacy and Transportation Is the patient able to respond to health literacy and transportation needs?: Yes Health Literacy - How often do you need to have someone help you when you read instructions, pamphlets, or other written material from your doctor or pharmacy?: Never In the past 12 months, has lack of transportation kept you from medical appointments or from getting medications?: No In the past 12 months, has lack of transportation kept you from meetings, work, or from getting things needed for daily living?: No  Home Assistive Devices / Equipment Home Equipment: Grab bars - tub/shower, Hand held shower head, Grab bars - toilet  Prior Device Use: Indicate devices/aids used by the  patient prior to current illness, exacerbation or injury? None of the above  Current Functional Level Cognition  Arousal/Alertness: Awake/alert Overall Cognitive Status:  (NT formally, followed 1-2 step commands, good focus on task.) Orientation Level: Oriented X4 General Comments: at times difficult word finding, for example stated tuesday when asked the month, then corrected to correct month Attention: Sustained Sustained Attention: Impaired Sustained Attention Impairment: Verbal basic Memory: Impaired Memory Impairment: Storage deficit, Retrieval deficit Awareness: Impaired Awareness Impairment: Emergent impairment Problem Solving: Impaired Problem Solving Impairment: Verbal basic    Extremity Assessment (includes Sensation/Coordination)  Upper Extremity Assessment: Defer to OT evaluation RUE Deficits / Details: A/ROM shoulder flexion ~25% range, elbow flexion 50% range, wrist flexion/extension and supination/pronation WFL. Able to demonstrate full finger extension and flexion. MMT: 3-/5 shoulder flexion, elbow flexion/extension, 3/5 wrist supination/pronation, 3/5 wrist flexion, 3-/5 wrist extension, impaired gross grasp. RUE Sensation: decreased proprioception RUE Coordination: decreased gross motor, decreased fine motor  Lower Extremity Assessment: RLE deficits/detail RLE Deficits / Details: limited stregnth, control, power. able to move against gravity but hyperextends in stance. limited hip flexion and poor advancement for steps RLE Sensation: decreased light touch RLE Coordination: decreased fine motor, decreased gross motor    ADLs  Overall ADL's : Needs assistance/impaired Eating/Feeding: Set up, Sitting Grooming: Set up, Sitting Upper Body Bathing: Moderate assistance, Sitting Lower Body Bathing: Maximal assistance, Sit to/from stand, Sitting/lateral leans Upper Body Dressing : Moderate assistance, Sitting Lower Body Dressing: Total assistance, Sitting/lateral leans,  Sit to/from stand Toilet Transfer: Moderate assistance, +2 for physical assistance, Cueing for sequencing, Cueing for safety, Stand-pivot, BSC/3in1 Toileting- Clothing Manipulation and Hygiene: Total assistance, Sit to/from stand    Mobility  Overal bed mobility: Needs Assistance Bed Mobility: Rolling, Sidelying to Sit Rolling: Min assist Sidelying to sit: Mod assist, +2 for safety/equipment (flat HOB) Supine to sit: Min assist, +2 for physical assistance, +2 for safety/equipment, HOB elevated, Used rails General bed mobility comments: cues for sequencing--hooklying to normal roll to up via L elbow/UE.  Light mod to coordinate up to midline.  min stability while pt scoot symmetrically forward/back.    Transfers  Overall transfer level: Needs assistance Equipment used: None (chairback) Transfers: Sit to/from Stand Sit to Stand: Min assist, +2 safety/equipment Bed to/from chair/wheelchair/BSC transfer type:: Step pivot Step pivot  transfers: Mod assist, +2 physical assistance, +2 safety/equipment General transfer comment: VC for technique and sequencing provided. Physical assist to weight shift in order to move RLE.    Ambulation / Gait / Stairs / Wheelchair Mobility  Ambulation/Gait Ambulation/Gait assistance: Mod assist, +2 physical assistance Gait Distance (Feet): 3 Feet Assistive device: 2 person hand held assist Gait Pattern/deviations: Step-to pattern, Decreased stride length, Knee hyperextension - right, Knee flexed in stance - right General Gait Details: NT in lieu of more Pre-gait. Gait velocity: decreased Gait velocity interpretation: <1.31 ft/sec, indicative of household ambulator Pre-gait activities: 4 trials of pregait  2+ min each.  1st with w/shifting, knee control, locking unlocking,  2nd--w/shift, unload, step forward R LE, translate forward/back step with assist avoiding drag back, 2-3 trials, 10 reps of controlled squats with,  3rd  knee control, step forward/back and  transition to 3 steps L and 3 steps R with control assist and normalized sequencing.  4th all 4 activities.    Posture / Balance Dynamic Sitting Balance Sitting balance - Comments: sitting EOB Balance Overall balance assessment: Needs assistance, History of Falls Sitting-balance support: No upper extremity supported, Single extremity supported, Feet supported Sitting balance-Leahy Scale: Fair Sitting balance - Comments: sitting EOB Postural control: Posterior lean (standing) Standing balance support: Bilateral upper extremity supported, During functional activity Standing balance-Leahy Scale: Poor Standing balance comment: trunk, shd girdle, pelvis and R knee work and controlled with min to mod assist at given times.    Special needs/care consideration    Previous Home Environment  Living Arrangements: Alone  Lives With: Alone Available Help at Discharge:  (exwife and their children and friends, exwife does work ? if she can get FMLA. Cared for him after last CVA) Type of Home: House Home Layout: One level Home Access: Stairs to enter Entrance Stairs-Rails: Right Entrance Stairs-Number of Steps: 2 Bathroom Shower/Tub: Engineer, Manufacturing Systems: Standard Bathroom Accessibility: Yes How Accessible: Accessible via walker  Discharge Living Setting Plans for Discharge Living Setting: House (Will stay with ex wife) Discharge Home Layout: One level Discharge Home Access: Stairs to enter Entrance Stairs-Number of Steps: 2 Discharge Bathroom Shower/Tub: Tub/shower unit Discharge Bathroom Toilet: Standard  Social/Family/Support Systems Anticipated Caregiver: Ex wife, [name] Anticipated Industrial/product Designer Information: [phone] Caregiver Availability: 24/7 Discharge Plan Discussed with Primary Caregiver: Yes Is Caregiver In Agreement with Plan?: Yes Does Caregiver/Family have Issues with Lodging/Transportation while Pt is in Rehab?: No  Goals Patient/Family Goal for  Rehab: Mod I PT, OT, Independent SLP Expected length of stay: 12-16 days Pt/Family Agrees to Admission and willing to participate: Yes Program Orientation Provided & Reviewed with Pt/Caregiver Including Roles  & Responsibilities: Yes  Barriers to Discharge: Insurance for SNF coverage  Decrease burden of Care through IP rehab admission: n/a  Possible need for SNF placement upon discharge:not anticipated  Patient Condition: This patient's condition remains as documented in the consult dated 08/13/23, in which the Rehabilitation Physician determined and documented that the patient's condition is appropriate for intensive rehabilitative care in an inpatient rehabilitation facility. Will admit to inpatient rehab today.  Preadmission Screen Completed By:  Alison Heron Lot, RN MSN 08/14/2023 4:18 PM ______________________________________________________________________   Discussed status with Dr. Babs on 08/14/23 at 1618 and received approval for admission today.  Admission Coordinator:  Alison Heron Lot, RN MSN time [id number] Date 08/14/23

## 2023-08-14 NOTE — Progress Notes (Signed)
 Physical Therapy Treatment Patient Details Name: EDMAN LIPSEY MRN: [id number] DOB: 06-24-1956 Today's Date: 08/14/2023   History of Present Illness The pt is a 68 yo male presenting 1/4 with R sided weakness. CT showed a large Left basal ganglia ICH with IVH extension. PMH includes:  A-fib, carotid artery stenosis, CAD, tonsil cancer, hypothyroidism, L MCA CVA.    PT Comments  Patient progressing with ambulation in hallway using rail on L side and PT assist for R foot placement and progression.  Working in reverse to train hamstrings on R and due to continued reliance on L UE support.  Patient tolerating intensity and appropriate for acute inpatient rehab (>3 hours/day).      If plan is discharge home, recommend the following: A lot of help with bathing/dressing/bathroom;A lot of help with walking and/or transfers;Help with stairs or ramp for entrance;Assist for transportation;Assistance with cooking/housework   Can travel by private vehicle        Equipment Recommendations  Other (comment) (TBA)    Recommendations for Other Services       Precautions / Restrictions Precautions Precautions: Fall Precaution Comments: R hemi     Mobility  Bed Mobility Overal bed mobility: Needs Assistance Bed Mobility: Supine to Sit   Sidelying to sit: Mod assist, HOB elevated       General bed mobility comments: assist for R LE and scooting hips    Transfers Overall transfer level: Needs assistance Equipment used: None (versus wall rail) Transfers: Sit to/from Stand, Bed to chair/wheelchair/BSC   Stand pivot transfers: Mod assist         General transfer comment: up to recliner on L side with A for balance and cues for positioning in chair    Ambulation/Gait   Gait Distance (Feet): 8 Feet (forward then back x 3 reps) Assistive device:  (wall rail on L side; PT on R side; first trial with PRAFO, then without) Gait Pattern/deviations: Step-to pattern, Step-through pattern,  Decreased stride length, Decreased dorsiflexion - right, Decreased weight shift to right, Knee hyperextension - right       General Gait Details: Gait forward with A for R hip/knee control in stance, placement of R foot with wider step width; to prevent hyperextension and for balance; backing up A for engaging hamstrings and for moving/placing R foot   Stairs             Wheelchair Mobility     Tilt Bed    Modified Rankin (Stroke Patients Only) Modified Rankin (Stroke Patients Only) Pre-Morbid Rankin Score: No significant disability Modified Rankin: Moderately severe disability     Balance Overall balance assessment: Needs assistance   Sitting balance-Leahy Scale: Good     Standing balance support: Single extremity supported, Bilateral upper extremity supported Standing balance-Leahy Scale: Poor Standing balance comment: A for balance in standing and L UE support                            Cognition Arousal: Alert Behavior During Therapy: WFL for tasks assessed/performed Overall Cognitive Status: Within Functional Limits for tasks assessed                                          Exercises      General Comments General comments (skin integrity, edema, etc.): Condom cath fell off during session, RN aware  Pertinent Vitals/Pain Pain Assessment Pain Assessment: No/denies pain    Home Living                          Prior Function            PT Goals (current goals can now be found in the care plan section) Progress towards PT goals: Progressing toward goals    Frequency    Min 1X/week      PT Plan      Co-evaluation              AM-PAC PT 6 Clicks Mobility   Outcome Measure  Help needed turning from your back to your side while in a flat bed without using bedrails?: A Little Help needed moving from lying on your back to sitting on the side of a flat bed without using bedrails?: A  Little Help needed moving to and from a bed to a chair (including a wheelchair)?: A Lot Help needed standing up from a chair using your arms (e.g., wheelchair or bedside chair)?: A Lot Help needed to walk in hospital room?: A Lot Help needed climbing 3-5 steps with a railing? : Total 6 Click Score: 13    End of Session Equipment Utilized During Treatment: Gait belt Activity Tolerance: Patient tolerated treatment well Patient left: in chair;with call bell/phone within reach   PT Visit Diagnosis: Other abnormalities of gait and mobility (R26.89);Hemiplegia and hemiparesis Hemiplegia - Right/Left: Right Hemiplegia - dominant/non-dominant: Dominant Hemiplegia - caused by: Nontraumatic intracerebral hemorrhage     Time: 1500-1529 PT Time Calculation (min) (ACUTE ONLY): 29 min  Charges:    $Gait Training: 8-22 mins $Therapeutic Activity: 8-22 mins PT General Charges $$ ACUTE PT VISIT: 1 Visit                     Micheline Portal, PT Acute Rehabilitation Services [phone] 08/14/2023    Montie Portal 08/14/2023, 5:34 PM

## 2023-08-14 NOTE — Plan of Care (Signed)
  Problem: Education: Goal: Knowledge of General Education information will improve Description: Including pain rating scale, medication(s)/side effects and non-pharmacologic comfort measures Outcome: Progressing   Problem: Nutrition: Goal: Dietary intake will improve 08/14/2023 1157 by Shaylie Eklund, Ellery SAILOR, RN Outcome: Progressing 08/14/2023 1156 by [place] Ellery SAILOR, RN Outcome: Progressing   Problem: Nutrition: Goal: Risk of aspiration will decrease 08/14/2023 1157 by Lidia Clavijo, Ellery SAILOR, RN Outcome: Progressing 08/14/2023 1156 by [place] Ellery SAILOR, RN Outcome: Progressing   Problem: Clinical Measurements: Goal: Cardiovascular complication will be avoided Outcome: Progressing   Problem: Clinical Measurements: Goal: Respiratory complications will improve Outcome: Progressing   Problem: Clinical Measurements: Goal: Diagnostic test results will improve Outcome: Progressing   Problem: Clinical Measurements: Goal: Will remain free from infection Outcome: Progressing   Problem: Clinical Measurements: Goal: Ability to maintain clinical measurements within normal limits will improve Outcome: Progressing

## 2023-08-14 NOTE — Discharge Summary (Signed)
 Physician Discharge Summary  Adrian [name] FMW:[id number] DOB: 09/10/1955 DOA: 08/11/2023  PCP: Micheal Wolm ORN, MD  Admit date: 08/11/2023 Discharge date: 08/14/2023  Admitted From: Home Disposition: Acute inpatient rehab  Recommendations for Outpatient Follow-up:  Schedule follow-up with neurology on discharge  Home Health: N/A Equipment/Devices: N/A  Discharge Condition: Stable CODE STATUS: Full code Diet recommendation: Low-salt diet  Discharge summary: 68 year old with history of A-fib on Eliquis , carotid artery stenosis, hypothyroidism presented on 1/4 with acute onset of right-sided weakness.  He was hemiplegic on the right side, hypertensive with blood pressure 230 with EMS.  CT scan showed large basal ganglia intracranial hemorrhage with IVH extension.  He was admitted to neuro ICU, treated with Cleviprex .  Ultimately transferred out of ICU on 1/6.  Medically stabilizing.  Able to go to rehab today.    Assessment & Plan:   Acute hemorrhagic stroke, left basal ganglia intracranial hemorrhage with IVH.  Likely hypertensive in the setting of Eliquis  use: Clinical findings, acute onset right hemiplegia and dysarthria. CT head findings, left basal ganglia hemorrhage 1.8 x 2 x 1.1 cm.  Intraventricular extension. MRI of the brain, similar to CT scan.  No progressive mass effect. CT angiogram head and neck with perfusion, no aneurysm or vascular malformation.  Chronic occlusion of proximal left ICA. Repeat CT scan with stable size hematoma.  No hydrocephalus. 2D echocardiogram, normal ejection fraction. Antiplatelet therapy, on Eliquis  at home.  Currently off of any anticoagulation or antiplatelets. LDL 117.  Increasing dose of atorvastatin  from 40 to 80 mg daily. Hemoglobin A1c, 5.6.   Paroxysmal A-fib: Patient currently rate controlled in sinus rhythm.  Patient is on amiodarone  200 mg daily, Eliquis  5 mg twice daily, Lopressor  50 mg twice daily at home.  Continued on Lopressor .   Resume amiodarone . Patient is status post ablation and loop recorder placed.  Off anticoagulation.  Outpatient follow-up with cardiology.   Essential hypertension: Initially on Cleviprex  infusion.  Ultimately stabilizing.  On Lopressor  50 mg twice daily.  Blood pressures now increasing.  Will suggest to start amlodipine  2.5 mg tomorrow morning.  May need further escalation of doses.  Hyperlipidemia: LDL 117.  On Lipitor  80 mg.   Hypothyroidism: Resumed home Synthroid .   Smoker: Counseled to quit.     Transfer to CIR when bed available.  Discharge Diagnoses:  Principal Problem:   ICH (intracerebral hemorrhage) [facility])    Discharge Instructions  Discharge Instructions     Ambulatory referral to Neurology   Complete by: As directed    Follow up with Dr. Rosemarie at [facility] in 3-4 weeks. Too complicated for RN to follow. Thanks.   Diet - low sodium heart healthy   Complete by: As directed    Increase activity slowly   Complete by: As directed       Allergies as of 08/14/2023   No Known Allergies      Medication List     STOP taking these medications    apixaban  5 MG Tabs tablet Commonly known as: Eliquis        TAKE these medications    amiodarone  200 MG tablet Commonly known as: PACERONE  Take 200 mg by mouth daily.   amLODipine  2.5 MG tablet Commonly known as: NORVASC  Take 1 tablet (2.5 mg total) by mouth daily. Start taking on: August 15, 2023   atorvastatin  80 MG tablet Commonly known as: LIPITOR  Take 1 tablet (80 mg total) by mouth daily. What changed:  medication strength how much to take   levothyroxine   125 MCG tablet Commonly known as: SYNTHROID  TAKE 1 TABLET BY MOUTH EVERY DAY   metoprolol  tartrate 50 MG tablet Commonly known as: LOPRESSOR  Take 1 tablet (50 mg total) by mouth 2 (two) times daily.   senna-docusate 8.6-50 MG tablet Commonly known as: Senokot-S Take 1 tablet by mouth 2 (two) times daily.        Follow-up Information     Rosemarie Eather RAMAN, MD Follow up in 1 month(s).   Specialties: Neurology, Radiology Why: stroke clinic Contact information: [address] [place] [address] [phone]                No Known Allergies  Consultations: Neurology   Procedures/Studies: CT HEAD WO CONTRAST Result Date: 08/12/2023 CLINICAL DATA:  Stroke follow-up EXAM: CT HEAD WITHOUT CONTRAST TECHNIQUE: Contiguous axial images were obtained from the base of the skull through the vertex without intravenous contrast. RADIATION DOSE REDUCTION: This exam was performed according to the departmental dose-optimization program which includes automated exposure control, adjustment of the mA and/or kV according to patient size and/or use of iterative reconstruction technique. COMPARISON:  Brain MRI from yesterday FINDINGS: Brain: Some Collison and retraction of blood products in the left lateral ventricle, no evidence of progressive bleeding. The parenchymal hemorrhage centered at the upper left basal ganglia and corona radiata is stable in size at up to 2 cm span on axial images. Chronic infarct at the left insula and frontotemporal operculum. Vascular: No acute finding Skull: Normal. Negative for fracture or focal lesion. Sinuses/Orbits: No acute finding. IMPRESSION: No progression of left basal ganglia hemorrhage with intraventricular extension. Lateral ventricular clot. No hydrocephalus. Electronically Signed   By: Dorn Roulette M.D.   On: 08/12/2023 07:25   MR BRAIN W WO CONTRAST Result Date: 08/11/2023 CLINICAL DATA:  Stroke, follow up Neuro deficit, acute, stroke suspected EXAM: MRI HEAD WITHOUT AND WITH CONTRAST TECHNIQUE: Multiplanar, multiecho pulse sequences of the brain and surrounding structures were obtained without and with intravenous contrast. CONTRAST:  6.5mL GADAVIST  GADOBUTROL  1 MMOL/ML IV SOLN COMPARISON:  CT head from today. FINDINGS: Brain: When comparing across modalities, no substantial change in  intraparenchymal hemorrhage in the left basal ganglia with intraventricular extension. No visible surrounding acute hemorrhage or mass lesion; however, acute blood products limits assessment. Prior left frontal infarct with encephalomalacia. No hydrocephalus. No pathologic enhancement. Vascular: Major arterial flow voids are maintained at the skull base. Skull and upper cervical spine: Normal marrow signal. Sinuses/Orbits: Right frontal sinus opacification. Remaining sinuses are clear. No acute orbital findings. Other: No mastoid effusions. IMPRESSION: 1. When comparing across modalities, no substantial change in intraparenchymal hemorrhage in the left basal ganglia with intraventricular extension. No progressive mass effect. 2. No visible surrounding acute hemorrhage or mass lesion; however, acute blood products limits assessment Electronically Signed   By: Gilmore RAMAN Molt M.D.   On: 08/11/2023 20:38   CT ANGIO HEAD NECK W WO CM (CODE STROKE) Result Date: 08/11/2023 CLINICAL DATA:  Neuro deficit, acute, stroke suspected. Acute right-sided weakness. EXAM: CT ANGIOGRAPHY HEAD AND NECK WITH AND WITHOUT CONTRAST TECHNIQUE: Multidetector CT imaging of the head and neck was performed using the standard protocol during bolus administration of intravenous contrast. Multiplanar CT image reconstructions and MIPs were obtained to evaluate the vascular anatomy. Carotid stenosis measurements (when applicable) are obtained utilizing NASCET criteria, using the distal internal carotid diameter as the denominator. RADIATION DOSE REDUCTION: This exam was performed according to the departmental dose-optimization program which includes automated exposure control, adjustment  of the mA and/or kV according to patient size and/or use of iterative reconstruction technique. CONTRAST:  75mL OMNIPAQUE  IOHEXOL  350 MG/ML SOLN COMPARISON:  CTA head and neck 03/08/2019 FINDINGS: CTA NECK FINDINGS Aortic arch: Normal variant 4 vessel aortic  arch with the left vertebral artery arising directly from the arch. No significant stenosis of the arch vessel origins. Right carotid system: Patent with a small amount of calcified plaque at the carotid bifurcation and in the proximal ICA. No evidence of a significant stenosis or dissection. Left carotid system: The common carotid artery is patent. There is prominent calcified and soft plaque at the carotid bifurcation with chronic occlusion of the ICA at the bulb without reconstitution in the neck. Vertebral arteries: Patent and codominant without evidence of a significant stenosis or dissection. Skeleton: Solid C5-6 ACDF. Advanced disc degeneration at C3-4 and C6-7 with asymmetric right neural foraminal stenosis at these levels. Asymmetrically advanced left facet arthrosis in the upper cervical spine. Other neck: No evidence of cervical lymphadenopathy or mass. Upper chest: Biapical pleuroparenchymal lung scarring. Review of the MIP images confirms the above findings CTA HEAD FINDINGS Anterior circulation: The intracranial right ICA is patent with mild atherosclerosis not resulting in significant stenosis. There is reconstitution of the left ICA beginning in the horizontal petrous segment with the vessel remaining grossly patent though irregularly narrowed distally through the terminus, with a particularly thready appearance in the distal petrous and proximal cavernous segments. ACAs and MCAs are patent without evidence of a significant A1 or M1 stenosis. There is attenuation of left MCA branch vessels in the setting of a chronic infarct. Two adjacent punctate foci of contrast are present in the medial aspect of the acute left basal ganglia hemorrhage suggestive of contrast extravasation and possible ongoing bleeding. No aneurysm or vascular malformation is identified. Posterior circulation: The intracranial vertebral arteries are widely patent to the basilar. The basilar artery is widely patent. Posterior  communicating arteries are diminutive or absent. The PCAs are patent with the right PCA appearing mildly smaller than the left diffusely but without evidence of a flow limiting proximal stenosis. No aneurysm is identified. Venous sinuses: Patent. Anatomic variants: None. Review of the MIP images confirms the above findings These results were communicated to Dr. Vanessa at 2:54 pm on 08/11/2023 by text page via the [facility] messaging system. IMPRESSION: 1. CTA spot sign/punctate foci of contrast extravasation within the left basal ganglia hematoma. 2. No aneurysm, vascular malformation, or acute large vessel occlusion identified. 3. 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 Electronically Signed   By: Dasie Hamburg M.D.   On: 08/11/2023 15:08   CT HEAD CODE STROKE WO CONTRAST Result Date: 08/11/2023 CLINICAL DATA:  Code stroke.  Neuro deficit, acute, stroke suspected EXAM: CT HEAD WITHOUT CONTRAST TECHNIQUE: Contiguous axial images were obtained from the base of the skull through the vertex without intravenous contrast. RADIATION DOSE REDUCTION: This exam was performed according to the departmental dose-optimization program which includes automated exposure control, adjustment of the mA and/or kV according to patient size and/or use of iterative reconstruction technique. COMPARISON:  Head CT 06/08/2023 FINDINGS: Brain: 1.8 x 2.0 x 1.1 cm parenchymal hemorrhage centered in the left basal ganglia with intraventricular extension. There are layering blood products in the left occipital horn. No evidence of hydrocephalus at the time of exam. No significant mass effect or midline shift. There is chronic infarct involving the left frontal lobe and insular region. No extra-axial fluid collection. Vascular: No hyperdense vessel  or unexpected calcification. Skull: Normal. Negative for fracture or focal lesion. Sinuses/Orbits: No middle ear or mastoid effusion. Paranasal sinuses are notable for  mucosal thickening in the right frontal and anterior ethmoid air cells. Orbits are unremarkable. Other: None ASPECTS ([place]ta Stroke Program Early CT Score) IMPRESSION: 1.8 x 2.0 x 1.1 cm parenchymal hemorrhage centered in the left basal ganglia with intraventricular extension. No evidence of hydrocephalus at the time of exam. No significant midline shift. Findings were paged to Dr. Vanessa on 08/11/23 at 2:29 PM Electronically Signed   By: Lyndall Gore M.D.   On: 08/11/2023 14:31   CUP PACEART REMOTE DEVICE CHECK Result Date: 07/26/2023 ILR summary report received. Battery status OK. Normal device function. No new symptom, tachy, brady, or pause episodes. No new AF episodes. Monthly summary reports and ROV/PRN LA, CVRS  (Echo, Carotid, EGD, Colonoscopy, ERCP)    Subjective: Patient seen in the morning rounds.  Denies any new complaints.  Eager to go to rehab.   Discharge Exam: Vitals:   08/14/23 0800 08/14/23 1200  BP: 117/68 (!) 148/81  Pulse: (!) 53 64  Resp: 14 19  Temp: 97.7 F (36.5 C) (!) 97.1 F (36.2 C)  SpO2: 96% 94%   Vitals:   08/14/23 0000 08/14/23 0400 08/14/23 0800 08/14/23 1200  BP: 110/69 (!) 106/92 117/68 (!) 148/81  Pulse: 61 (!) 52 (!) 53 64  Resp: 17 20 14 19   Temp: 98.1 F (36.7 C) 98.3 F (36.8 C) 97.7 F (36.5 C) (!) 97.1 F (36.2 C)  TempSrc: Oral Oral Oral Axillary  SpO2: 92% 90% 96% 94%  Weight:      Height:       General exam: Appears calm and comfortable  Gastrointestinal system: Abdomen is nondistended, soft and nontender. No organomegaly or masses felt. Normal bowel sounds heard. Central nervous system: Alert and oriented. Speech is clear. Has mild right-sided facial droop. Left upper and lower extremities with normal power and sensation. Right upper extremity with decreased power, can use proximal muscles against gravity.  Distal muscles 3/5. Right lower extremity with decreased power, 3 x 5 proximal muscle group.  0/5 distal  muscles.     The results of significant diagnostics from this hospitalization (including imaging, microbiology, ancillary and laboratory) are listed below for reference.     Microbiology: Recent Results (from the past 240 hours)  MRSA Next Gen by PCR, Nasal     Status: None   Collection Time: 08/11/23  4:00 PM   Specimen: Nasal Mucosa; Nasal Swab  Result Value Ref Range Status   MRSA by PCR Next Gen NOT DETECTED NOT DETECTED Final    Comment: (NOTE) The GeneXpert MRSA Assay (FDA approved for NASAL specimens only), is one component of a comprehensive MRSA colonization surveillance program. It is not intended to diagnose MRSA infection nor to guide or monitor treatment for MRSA infections. Test performance is not FDA approved in patients less than 55 years old. Performed at [facility], [address], [place], [place] [address]      Labs: BNP (last 3 results) Recent Labs    12/19/22 1152  BNP 92.1   Basic Metabolic Panel: Recent Labs  Lab 08/11/23 1417 08/11/23 1438 08/12/23 0428 08/13/23 0448 08/14/23 0552  NA 132* 135 133* 133* 135  K 4.4 5.1 3.8 3.8 4.1  CL 98 103 99 100 98  CO2 23  --  23 23 26   GLUCOSE 102* 97 109* 108* 102*  BUN 16 23 11  18 18  CREATININE 0.86 0.70 0.73 0.91 1.06  CALCIUM  9.0  --  9.2 9.1 9.0   Liver Function Tests: Recent Labs  Lab 08/11/23 1417  AST 25  ALT 23  ALKPHOS 56  BILITOT 0.8  PROT 6.6  ALBUMIN 3.5   No results for input(s): LIPASE, AMYLASE in the last 168 hours. No results for input(s): AMMONIA in the last 168 hours. CBC: Recent Labs  Lab 08/11/23 1417 08/11/23 1438 08/12/23 0428 08/13/23 0448 08/14/23 0552  WBC 2.3*  --  2.7* 2.9* 3.1*  NEUTROABS 0.5*  --   --   --   --   HGB 14.4 16.7 15.8 15.5 15.5  HCT 41.7 49.0 44.0 43.9 44.1  MCV 99.5  --  96.1 98.0 98.9  PLT 132*  --  152 138* 131*   Cardiac Enzymes: No results for input(s): CKTOTAL, CKMB, CKMBINDEX, TROPONINI in the last 168  hours. BNP: Invalid input(s): POCBNP CBG: Recent Labs  Lab 08/11/23 1446  GLUCAP 105*   D-Dimer No results for input(s): DDIMER in the last 72 hours. Hgb A1c Recent Labs    08/12/23 0428  HGBA1C 5.6   Lipid Profile Recent Labs    08/12/23 0428  CHOL 199  HDL 56  LDLCALC 117*  TRIG 128  CHOLHDL 3.6   Thyroid  function studies No results for input(s): TSH, T4TOTAL, T3FREE, THYROIDAB in the last 72 hours.  Invalid input(s): FREET3 Anemia work up No results for input(s): VITAMINB12, FOLATE, FERRITIN, TIBC, IRON, RETICCTPCT in the last 72 hours. Urinalysis    Component Value Date/Time   COLORURINE YELLOW 11/29/2018 1647   APPEARANCEUR TURBID (A) 11/29/2018 1647   LABSPEC 1.020 11/29/2018 1647   PHURINE 6.0 11/29/2018 1647   GLUCOSEU NEGATIVE 11/29/2018 1647   HGBUR MODERATE (A) 11/29/2018 1647   BILIRUBINUR NEGATIVE 11/29/2018 1647   BILIRUBINUR neg 11/20/2018 0942   KETONESUR 5 (A) 11/29/2018 1647   PROTEINUR 100 (A) 11/29/2018 1647   UROBILINOGEN 0.2 11/20/2018 0942   NITRITE NEGATIVE 11/29/2018 1647   LEUKOCYTESUR MODERATE (A) 11/29/2018 1647   Sepsis Labs Recent Labs  Lab 08/11/23 1417 08/12/23 0428 08/13/23 0448 08/14/23 0552  WBC 2.3* 2.7* 2.9* 3.1*   Microbiology Recent Results (from the past 240 hours)  MRSA Next Gen by PCR, Nasal     Status: None   Collection Time: 08/11/23  4:00 PM   Specimen: Nasal Mucosa; Nasal Swab  Result Value Ref Range Status   MRSA by PCR Next Gen NOT DETECTED NOT DETECTED Final    Comment: (NOTE) The GeneXpert MRSA Assay (FDA approved for NASAL specimens only), is one component of a comprehensive MRSA colonization surveillance program. It is not intended to diagnose MRSA infection nor to guide or monitor treatment for MRSA infections. Test performance is not FDA approved in patients less than 37 years old. Performed at [facility], [address], [place], [place] [address]       Time coordinating discharge:  35 minutes  SIGNED:   Renato Applebaum, MD  [facility] 08/14/2023, 4:16 PM

## 2023-08-14 NOTE — Progress Notes (Signed)
 Babs Arthea DASEN, MD  Physician Physical Medicine and Rehabilitation   Consult Note     Signed   Date of Service: 08/13/2023 10:50 AM  Related encounter: ED to Hosp-Admission (Discharged) from 08/11/2023 in [facility] NEURO/TRAUMA/SURGICAL ICU   Signed     Expand All Collapse All  Show:Clear all [x] Written[x] Templated[] Copied  Added by: [x] Babs Arthea DASEN, MD  [] Hover for details          Physical Medicine and Rehabilitation Consult Reason for Consult:right sided weakness and functional deficits Referring Physician: Jerri     HPI: Adrian [name] is a 68 y.o. male with a history of atrial fibrillation on Eliquis , hypothyroidism and carotid artery stenosis who presented on 08/11/2023 with right sided weakness.  Patient was hypertensive upon arrival of EMS.  CT of the head demonstrated large basal ganglia intracranial hemorrhage with intraventricular hemorrhage extension.  CTA of the head and neck demonstrated spot sign with left basal ganglia hematoma.  No aneurysms, malformations or large vessel occlusions were identified.  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  Patient's Eliquis  was held and blood pressure was controlled.  Patient mobilized with physical therapy yesterday and was min assist for sit to stand transfers and mod assist for step pivot transfers.  He was able to ambulate 3 feet with mod assist +2 using hand-held assist.  Patient was independent and living alone prior to arrival.  Apparently had been having some falls.  He does have an ex-wife who may be able to assist him after discharge.     Review of Systems  Constitutional: Negative.   HENT: Negative.    Eyes: Negative.   Respiratory: Negative.    Cardiovascular: Negative.   Gastrointestinal: Negative.   Genitourinary: Negative.   Musculoskeletal:  Positive for falls and myalgias.  Skin: Negative.   Neurological:  Positive for focal weakness.   Psychiatric/Behavioral: Negative.          Past Medical History:  Diagnosis Date   AF (paroxysmal atrial fibrillation) (HCC) 11/29/2018   Atrial flutter (HCC) 10/12/2015    a. TEE 3/17 with ? LAA clot-->s/p TEE/DCCV 11/24/2015; s/p DCCV 12/2022;s/p ablation 06/2023   CAD (coronary artery disease), native coronary artery 12/06/2015    a. 10/2015 MV: EF  37%, reversible defect inferior apex, intermediate risk findings; b. 10/2015 Cath: 20% mid RCA.   Carotid artery stenosis      1-39% right ICA stenosis and occluded left ICA   Colonic diverticular abscess     Diverticulitis     History of chemotherapy 2005    Cisplatin   Hypothyroidism     NICM (nonischemic cardiomyopathy) (HCC) 10/14/2015    a. Tachy mediated?;  b. Echo 3/17 - Mild concentric LVH, EF 30-35%, anteroseptal, anterior, anterolateral, apical anterior, lateral hypokinesis, trivial MR, mild to moderately reduced RVSF; c. LHC 3/17 - mRCA 20%   Radiation NOv.3,2005-Dec. 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  XRT             Past Surgical History:  Procedure Laterality Date   A-FLUTTER ABLATION N/A 06/19/2023    Procedure: A-FLUTTER ABLATION;  Surgeon: Cindie Ole DASEN, MD;  Location: [facility] INVASIVE CV LAB;  Service: Cardiovascular;  Laterality: N/A;   APPENDECTOMY N/A 03/07/2019    Procedure: ROBOTIC ASSISTED APPENDECTOMY;  Surgeon: Sheldon Standing, MD;  Location: [facility] ORS;  Service: General;  Laterality: N/A;  CARDIAC CATHETERIZATION N/A 10/18/2015    Procedure: Left Heart Cath and Coronary Angiography;  Surgeon: Lonni JONETTA Cash, MD;  Location: [facility] INVASIVE CV LAB;  Service: Cardiovascular;  Laterality: N/A;   CARDIOVERSION N/A 11/24/2015    Procedure: CARDIOVERSION;  Surgeon: Maude JAYSON Emmer, MD;  Location: [facility] ENDOSCOPY;  Service: Cardiovascular;  Laterality: N/A;   CARDIOVERSION N/A 12/20/2022    Procedure: CARDIOVERSION;  Surgeon: Alvan Ronal BRAVO, MD;  Location: [facility] INVASIVE CV  LAB;  Service: Cardiovascular;  Laterality: N/A;   CYSTOSCOPY WITH STENT PLACEMENT Bilateral 03/07/2019    Procedure: CYSTOSCOPY WITH BILATERAL FIREFLY INJECTION;  Surgeon: Cam Morene ORN, MD;  Location: [facility] ORS;  Service: Urology;  Laterality: Bilateral;   GASTROSTOMY TUBE PLACEMENT   07/04/2004    IR - G tube for tonsilar cancer   IR ANGIO INTRA EXTRACRAN SEL COM CAROTID INNOMINATE UNI R MOD SED   03/09/2019   IR ANGIO VERTEBRAL SEL VERTEBRAL UNI R MOD SED   03/09/2019   IR CT HEAD LTD   03/09/2019   IR PERCUTANEOUS ART THROMBECTOMY/INFUSION INTRACRANIAL INC DIAG ANGIO   03/09/2019   IR RADIOLOGIST EVAL & MGMT   12/18/2018   LAMINECTOMY        C5/placement of steel plate   LOOP RECORDER INSERTION N/A 06/19/2023    Procedure: LOOP RECORDER INSERTION;  Surgeon: Cindie Ole DASEN, MD;  Location: [facility] INVASIVE CV LAB;  Service: Cardiovascular;  Laterality: N/A;   NECK SURGERY   2003    replaced disk   RADIOLOGY WITH ANESTHESIA N/A 03/08/2019    Procedure: IR WITH ANESTHESIA;  Surgeon: Dolphus Carrion, MD;  Location: [facility] OR;  Service: Radiology;  Laterality: N/A;   TEE WITHOUT CARDIOVERSION N/A 10/15/2015    Procedure: TRANSESOPHAGEAL ECHOCARDIOGRAM (TEE);  Surgeon: Ezra GORMAN Shuck, MD;  Location: [facility] ENDOSCOPY;  Service: Cardiovascular;  Laterality: N/A;   TEE WITHOUT CARDIOVERSION N/A 11/24/2015    Procedure: TRANSESOPHAGEAL ECHOCARDIOGRAM (TEE);  Surgeon: Maude JAYSON Emmer, MD;  Location: [facility] ENDOSCOPY;  Service: Cardiovascular;  Laterality: N/A;   TEE WITHOUT CARDIOVERSION N/A 12/20/2022    Procedure: TRANSESOPHAGEAL ECHOCARDIOGRAM;  Surgeon: Alvan Ronal BRAVO, MD;  Location: [facility] INVASIVE CV LAB;  Service: Cardiovascular;  Laterality: N/A;   XI ROBOTIC ASSISTED COLOSTOMY TAKEDOWN N/A 03/07/2019    Procedure: XI ROBOTIC ASSISTED LOW ANTERIOR RESECTION, RIGID PROCTOSCOPY;  Surgeon: Sheldon Standing, MD;  Location: [facility] ORS;  Service: General;  Laterality: N/A;             Family History  Problem Relation Age of  Onset   Prostate cancer Father     Colon cancer Father     Skin cancer Mother     Cancer Brother     Esophageal cancer Neg Hx     Rectal cancer Neg Hx     Stomach cancer Neg Hx          Social History:  reports that he has been smoking cigarettes. He has a 10 pack-year smoking history. He has never used smokeless tobacco. He reports current alcohol use of about 12.0 standard drinks of alcohol per week. He reports that he does not use drugs. Allergies:  Allergies  No Known Allergies         Medications Prior to Admission  Medication Sig Dispense Refill   amiodarone  (PACERONE ) 200 MG tablet Take 200 mg by mouth daily.       apixaban  (ELIQUIS ) 5 MG TABS tablet Take 1 tablet (5 mg total) by mouth 2 (two) times daily.  180 tablet 1   atorvastatin  (LIPITOR ) 40 MG tablet TAKE 1 TABLET BY MOUTH EVERY DAY 90 tablet 0   levothyroxine  (SYNTHROID ) 125 MCG tablet TAKE 1 TABLET BY MOUTH EVERY DAY 90 tablet 0   metoprolol  tartrate (LOPRESSOR ) 50 MG tablet Take 1 tablet (50 mg total) by mouth 2 (two) times daily. 180 tablet 3          Home: Home Living Family/patient expects to be discharged to:: Private residence Living Arrangements: Alone Available Help at Discharge: Family, Available 24 hours/day (Option to stay with his Ex wife short term when discharged if necessary) Type of Home: House Home Access: Stairs to enter Secretary/administrator of Steps: 2 Entrance Stairs-Rails: Right Home Layout: One level Bathroom Shower/Tub: Engineer, Manufacturing Systems: Standard Home Equipment: Grab bars - tub/shower, Hand held shower head, Grab bars - toilet  Lives With: Alone  Functional History: Prior Function Prior Level of Function : Independent/Modified Independent, Driving, History of Falls (last six months) Mobility Comments: no use of DME, independent living alone ADLs Comments: independent living alone Functional Status:  Mobility: Bed Mobility Overal bed mobility: Needs  Assistance Bed Mobility: Supine to Sit Supine to sit: Min assist, +2 for physical assistance, +2 for safety/equipment, HOB elevated, Used rails General bed mobility comments: VC to initiate and to utilize bed rail on the left side of bed as needed. Transfers Overall transfer level: Needs assistance Equipment used: None, 2 person hand held assist Transfers: Sit to/from Stand, Bed to chair/wheelchair/BSC Sit to Stand: Min assist, +2 physical assistance, From elevated surface Bed to/from chair/wheelchair/BSC transfer type:: Step pivot Step pivot transfers: Mod assist, +2 physical assistance, +2 safety/equipment General transfer comment: VC for technique and sequencing provided. Physical assist to weight shift in order to move RLE. Right knee blocked to prevent buckling when weight bearing. Ambulation/Gait Ambulation/Gait assistance: Mod assist, +2 physical assistance Gait Distance (Feet): 3 Feet Assistive device: 2 person hand held assist Gait Pattern/deviations: Step-to pattern, Decreased stride length, Knee hyperextension - right, Knee flexed in stance - right General Gait Details: assist to wt shift for stepping. assist to advance RLE. blocking of RLE, HHA in LUE Gait velocity: decreased Gait velocity interpretation: <1.31 ft/sec, indicative of household ambulator   ADL: ADL Overall ADL's : Needs assistance/impaired Eating/Feeding: Set up, Sitting Grooming: Set up, Sitting Upper Body Bathing: Moderate assistance, Sitting Lower Body Bathing: Maximal assistance, Sit to/from stand, Sitting/lateral leans Upper Body Dressing : Moderate assistance, Sitting Lower Body Dressing: Total assistance, Sitting/lateral leans, Sit to/from stand Toilet Transfer: Moderate assistance, +2 for physical assistance, Cueing for sequencing, Cueing for safety, Stand-pivot, BSC/3in1 Toileting- Clothing Manipulation and Hygiene: Total assistance, Sit to/from stand   Cognition: Cognition Overall Cognitive  Status: Impaired/Different from baseline Arousal/Alertness: Awake/alert Orientation Level: Oriented X4 Attention: Sustained Sustained Attention: Impaired Sustained Attention Impairment: Verbal basic Memory: Impaired Memory Impairment: Storage deficit, Retrieval deficit Awareness: Impaired Awareness Impairment: Emergent impairment Problem Solving: Impaired Problem Solving Impairment: Verbal basic Cognition Arousal: Alert Behavior During Therapy: WFL for tasks assessed/performed Overall Cognitive Status: Impaired/Different from baseline Area of Impairment: Orientation Orientation Level: Time (stated the year was 2026) General Comments: at times difficult word finding, for example stated tuesday when asked the month, then corrected to correct month   Blood pressure (!) 148/76, pulse (!) 59, temperature 98.2 F (36.8 C), temperature source Oral, resp. rate 18, height 5' 8 (1.727 m), weight 63.5 kg, SpO2 95%. Physical Exam Constitutional:      General: He is not in acute distress. HENT:  Head: Normocephalic.     Right Ear: External ear normal.     Left Ear: External ear normal.     Nose: Nose normal.  Eyes:     Pupils: Pupils are equal, round, and reactive to light.  Cardiovascular:     Rate and Rhythm: Normal rate.  Pulmonary:     Effort: Pulmonary effort is normal.  Abdominal:     Palpations: Abdomen is soft.  Musculoskeletal:        General: No swelling or deformity. Normal range of motion.     Cervical back: Normal range of motion.     Comments: Right heel cord tight, difficult to passively range to neutral.  Skin:    General: Skin is warm.  Neurological:     Mental Status: He is alert.     Comments: Pt is alert and oriented to place. Follows basic commands. Has reasonable memory insight and awareness. Right central VII with dysarthria. CN exam otherwise normal. MMT: RUE 3- prox to 3+ distally. RLE 3- prox to 0/5 distally. Sensory exam normal for light touch and  pain in all 4 limbs. No limb ataxia or cerebellar signs. No abnormal tone appreciated.    Psychiatric:        Mood and Affect: Mood normal.        Behavior: Behavior normal.        Lab Results Last 24 Hours       Results for orders placed or performed during the hospital encounter of 08/11/23 (from the past 24 hours)  Basic metabolic panel     Status: Abnormal    Collection Time: 08/13/23  4:48 AM  Result Value Ref Range    Sodium 133 (L) 135 - 145 mmol/L    Potassium 3.8 3.5 - 5.1 mmol/L    Chloride 100 98 - 111 mmol/L    CO2 23 22 - 32 mmol/L    Glucose, Bld 108 (H) 70 - 99 mg/dL    BUN 18 8 - 23 mg/dL    Creatinine, Ser 9.08 0.61 - 1.24 mg/dL    Calcium  9.1 8.9 - 10.3 mg/dL    GFR, Estimated >39 >39 mL/min    Anion gap 10 5 - 15  CBC     Status: Abnormal    Collection Time: 08/13/23  4:48 AM  Result Value Ref Range    WBC 2.9 (L) 4.0 - 10.5 K/uL    RBC 4.48 4.22 - 5.81 MIL/uL    Hemoglobin 15.5 13.0 - 17.0 g/dL    HCT 56.0 60.9 - 47.9 %    MCV 98.0 80.0 - 100.0 fL    MCH 34.6 (H) 26.0 - 34.0 pg    MCHC 35.3 30.0 - 36.0 g/dL    RDW 86.9 88.4 - 84.4 %    Platelets 138 (L) 150 - 400 K/uL    nRBC 0.0 0.0 - 0.2 %       Imaging Results (Last 48 hours)  CT HEAD WO CONTRAST Result Date: 08/12/2023 CLINICAL DATA:  Stroke follow-up EXAM: CT HEAD WITHOUT CONTRAST TECHNIQUE: Contiguous axial images were obtained from the base of the skull through the vertex without intravenous contrast. RADIATION DOSE REDUCTION: This exam was performed according to the departmental dose-optimization program which includes automated exposure control, adjustment of the mA and/or kV according to patient size and/or use of iterative reconstruction technique. COMPARISON:  Brain MRI from yesterday FINDINGS: Brain: Some Collison and retraction of blood products in the left lateral ventricle, no evidence of  progressive bleeding. The parenchymal hemorrhage centered at the upper left basal ganglia and corona  radiata is stable in size at up to 2 cm span on axial images. Chronic infarct at the left insula and frontotemporal operculum. Vascular: No acute finding Skull: Normal. Negative for fracture or focal lesion. Sinuses/Orbits: No acute finding. IMPRESSION: No progression of left basal ganglia hemorrhage with intraventricular extension. Lateral ventricular clot. No hydrocephalus. Electronically Signed   By: Dorn Roulette M.D.   On: 08/12/2023 07:25    MR BRAIN W WO CONTRAST Result Date: 08/11/2023 CLINICAL DATA:  Stroke, follow up Neuro deficit, acute, stroke suspected EXAM: MRI HEAD WITHOUT AND WITH CONTRAST TECHNIQUE: Multiplanar, multiecho pulse sequences of the brain and surrounding structures were obtained without and with intravenous contrast. CONTRAST:  6.5mL GADAVIST  GADOBUTROL  1 MMOL/ML IV SOLN COMPARISON:  CT head from today. FINDINGS: Brain: When comparing across modalities, no substantial change in intraparenchymal hemorrhage in the left basal ganglia with intraventricular extension. No visible surrounding acute hemorrhage or mass lesion; however, acute blood products limits assessment. Prior left frontal infarct with encephalomalacia. No hydrocephalus. No pathologic enhancement. Vascular: Major arterial flow voids are maintained at the skull base. Skull and upper cervical spine: Normal marrow signal. Sinuses/Orbits: Right frontal sinus opacification. Remaining sinuses are clear. No acute orbital findings. Other: No mastoid effusions. IMPRESSION: 1. When comparing across modalities, no substantial change in intraparenchymal hemorrhage in the left basal ganglia with intraventricular extension. No progressive mass effect. 2. No visible surrounding acute hemorrhage or mass lesion; however, acute blood products limits assessment Electronically Signed   By: Gilmore GORMAN Molt M.D.   On: 08/11/2023 20:38    CT ANGIO HEAD NECK W WO CM (CODE STROKE) Result Date: 08/11/2023 CLINICAL DATA:  Neuro deficit, acute,  stroke suspected. Acute right-sided weakness. EXAM: CT ANGIOGRAPHY HEAD AND NECK WITH AND WITHOUT CONTRAST TECHNIQUE: Multidetector CT imaging of the head and neck was performed using the standard protocol during bolus administration of intravenous contrast. Multiplanar CT image reconstructions and MIPs were obtained to evaluate the vascular anatomy. Carotid stenosis measurements (when applicable) are obtained utilizing NASCET criteria, using the distal internal carotid diameter as the denominator. RADIATION DOSE REDUCTION: This exam was performed according to the departmental dose-optimization program which includes automated exposure control, adjustment of the mA and/or kV according to patient size and/or use of iterative reconstruction technique. CONTRAST:  75mL OMNIPAQUE  IOHEXOL  350 MG/ML SOLN COMPARISON:  CTA head and neck 03/08/2019 FINDINGS: CTA NECK FINDINGS Aortic arch: Normal variant 4 vessel aortic arch with the left vertebral artery arising directly from the arch. No significant stenosis of the arch vessel origins. Right carotid system: Patent with a small amount of calcified plaque at the carotid bifurcation and in the proximal ICA. No evidence of a significant stenosis or dissection. Left carotid system: The common carotid artery is patent. There is prominent calcified and soft plaque at the carotid bifurcation with chronic occlusion of the ICA at the bulb without reconstitution in the neck. Vertebral arteries: Patent and codominant without evidence of a significant stenosis or dissection. Skeleton: Solid C5-6 ACDF. Advanced disc degeneration at C3-4 and C6-7 with asymmetric right neural foraminal stenosis at these levels. Asymmetrically advanced left facet arthrosis in the upper cervical spine. Other neck: No evidence of cervical lymphadenopathy or mass. Upper chest: Biapical pleuroparenchymal lung scarring. Review of the MIP images confirms the above findings CTA HEAD FINDINGS Anterior circulation:  The intracranial right ICA is patent with mild atherosclerosis not resulting in significant stenosis. There is  reconstitution of the left ICA beginning in the horizontal petrous segment with the vessel remaining grossly patent though irregularly narrowed distally through the terminus, with a particularly thready appearance in the distal petrous and proximal cavernous segments. ACAs and MCAs are patent without evidence of a significant A1 or M1 stenosis. There is attenuation of left MCA branch vessels in the setting of a chronic infarct. Two adjacent punctate foci of contrast are present in the medial aspect of the acute left basal ganglia hemorrhage suggestive of contrast extravasation and possible ongoing bleeding. No aneurysm or vascular malformation is identified. Posterior circulation: The intracranial vertebral arteries are widely patent to the basilar. The basilar artery is widely patent. Posterior communicating arteries are diminutive or absent. The PCAs are patent with the right PCA appearing mildly smaller than the left diffusely but without evidence of a flow limiting proximal stenosis. No aneurysm is identified. Venous sinuses: Patent. Anatomic variants: None. Review of the MIP images confirms the above findings These results were communicated to Dr. Vanessa at 2:54 pm on 08/11/2023 by text page via the [facility] messaging system. IMPRESSION: 1. CTA spot sign/punctate foci of contrast extravasation within the left basal ganglia hematoma. 2. No aneurysm, vascular malformation, or acute large vessel occlusion identified. 3. 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 Electronically Signed   By: Dasie Hamburg M.D.   On: 08/11/2023 15:08    CT HEAD CODE STROKE WO CONTRAST Result Date: 08/11/2023 CLINICAL DATA:  Code stroke.  Neuro deficit, acute, stroke suspected EXAM: CT HEAD WITHOUT CONTRAST TECHNIQUE: Contiguous axial images were obtained from the base of the skull through  the vertex without intravenous contrast. RADIATION DOSE REDUCTION: This exam was performed according to the departmental dose-optimization program which includes automated exposure control, adjustment of the mA and/or kV according to patient size and/or use of iterative reconstruction technique. COMPARISON:  Head CT 06/08/2023 FINDINGS: Brain: 1.8 x 2.0 x 1.1 cm parenchymal hemorrhage centered in the left basal ganglia with intraventricular extension. There are layering blood products in the left occipital horn. No evidence of hydrocephalus at the time of exam. No significant mass effect or midline shift. There is chronic infarct involving the left frontal lobe and insular region. No extra-axial fluid collection. Vascular: No hyperdense vessel or unexpected calcification. Skull: Normal. Negative for fracture or focal lesion. Sinuses/Orbits: No middle ear or mastoid effusion. Paranasal sinuses are notable for mucosal thickening in the right frontal and anterior ethmoid air cells. Orbits are unremarkable. Other: None ASPECTS ([place] Stroke Program Early CT Score) IMPRESSION: 1.8 x 2.0 x 1.1 cm parenchymal hemorrhage centered in the left basal ganglia with intraventricular extension. No evidence of hydrocephalus at the time of exam. No significant midline shift. Findings were paged to Dr. Vanessa on 08/11/23 at 2:29 PM Electronically Signed   By: Lyndall Gore M.D.   On: 08/11/2023 14:31       Assessment/Plan: Diagnosis: 68 year old male status post left basal ganglia hemorrhage due to hypertension and Eliquis  use with subsequent right hemiparesis Does the need for close, 24 hr/day medical supervision in concert with the patient's rehab needs make it unreasonable for this patient to be served in a less intensive setting? Yes Co-Morbidities requiring supervision/potential complications:  -Hypertension -Atrial fibrillation -Chronic leukopenia Due to bladder management, bowel management, safety, skin/wound  care, disease management, medication administration, pain management, and patient education, does the patient require 24 hr/day rehab nursing? Yes Does the patient require coordinated care of a physician, rehab nurse, therapy disciplines  of PT, OT, SLP to address physical and functional deficits in the context of the above medical diagnosis(es)? Yes Addressing deficits in the following areas: balance, endurance, locomotion, strength, transferring, bowel/bladder control, bathing, dressing, feeding, grooming, toileting, speech, and psychosocial support Can the patient actively participate in an intensive therapy program of at least 3 hrs of therapy per day at least 5 days per week? Yes The potential for patient to make measurable gains while on inpatient rehab is excellent Anticipated functional outcomes upon discharge from inpatient rehab are modified independent  with PT, modified independent and supervision with OT, modified independent with SLP. Estimated rehab length of stay to reach the above functional goals is: 12-16 days Anticipated discharge destination: Home Overall Rehab/Functional Prognosis: excellent   POST ACUTE RECOMMENDATIONS: This patient's condition is appropriate for continued rehabilitative care in the following setting: CIR Patient has agreed to participate in recommended program. Yes Note that insurance prior authorization may be required for reimbursement for recommended care.   Comment: Rehab Admissions Coordinator to follow up.       MEDICAL RECOMMENDATIONS: Will order PRAFO RLE as heel cord is already tight     I have personally performed a face to face diagnostic evaluation of this patient. Additionally, I have examined the patient's medical record including any pertinent labs and radiographic images. If the physician assistant has documented in this note, I have reviewed and edited or otherwise concur with the physician assistant's documentation.   Thanks,    Arthea ONEIDA Gunther, MD 08/13/2023          Routing History

## 2023-08-14 NOTE — Plan of Care (Signed)
  Problem: Coping: Goal: Will verbalize positive feelings about self Outcome: Progressing   Problem: Education: Goal: Knowledge of secondary prevention will improve (MUST DOCUMENT ALL) Outcome: Progressing   Problem: Intracerebral Hemorrhage Tissue Perfusion: Goal: Complications of Intracerebral Hemorrhage will be minimized Outcome: Progressing   Problem: Education: Goal: Knowledge of patient specific risk factors will improve Alonso N/A or DELETE if not current risk factor) Outcome: Progressing   Problem: Health Behavior/Discharge Planning: Goal: Goals will be collaboratively established with patient/family Outcome: Progressing   Problem: Self-Care: Goal: Ability to communicate needs accurately will improve Outcome: Progressing   Problem: Self-Care: Goal: Verbalization of feelings and concerns over difficulty with self-care will improve Outcome: Progressing   Problem: Nutrition: Goal: Risk of aspiration will decrease Outcome: Progressing   Problem: Clinical Measurements: Goal: Will remain free from infection Outcome: Progressing   Problem: Clinical Measurements: Goal: Diagnostic test results will improve Outcome: Progressing   Problem: Clinical Measurements: Goal: Respiratory complications will improve Outcome: Progressing   Problem: Activity: Goal: Risk for activity intolerance will decrease Outcome: Progressing   Problem: Nutrition: Goal: Adequate nutrition will be maintained Outcome: Progressing   Problem: Coping: Goal: Level of anxiety will decrease Outcome: Progressing

## 2023-08-14 NOTE — H&P (Signed)
 Physical Medicine and Rehabilitation Admission H&P   CC: Functional deficits secondary to basal ganglia intracranial hemorrhage with IVH extension   HPI: Adrian [name] is a 68 year old male who presented to the emergency department on 08/11/2023 reporting he woke up in his usual state of health and at approximately 11 AM noted abrupt onset of right-sided weakness.  He was brought to the emergency department as code stroke.  He was hypertensive to systolic blood pressure to 230 per EMS.  CT scan of the head without contrast revealed left basal ganglia intracranial hemorrhage with intraventricular extension.  CTA showed spots sign and blood pressure parameters were tightened to a goal of 110-130.  He was admitted to the ICU for strict blood pressure control.  His past medical history significant for atrial fibrillation on Eliquis , carotid artery stenosis, hypothyroidism, dispose loop recorder placed 06/19/2023 post A-fib ablation, chronic left ICA occlusion, tobacco use, hyperlipidemia, chronic leukopenia.  Examination he exhibited right-sided facial droop, right upper and lower extremity weakness and dysarthria.  2D echo performed with left ventricular ejection fraction 60 to 65%.  Hemoglobin A1c 5.6%.  SCDs placed for VTE prophylaxis.  The patient will follow-up with Dr. Rosemarie as an outpatient to consider ASPIRE trial if interested.  Alternatively to restart Eliquis  in 2 weeks if stable or follow-up with cardiology for other options including Watchman device.  He continues on amlodipine  2.5 mg daily and Lopressor  25 mg daily.  Lipitor  increased to 80 mg daily.  Resume home Synthroid .  To resume amiodarone  200 mg daily on admission to CIR.  Now on heparin  5000 units 3 times daily for DVT prophylaxis.  Advised regarding tobacco and alcohol use.  Right hemiparesis persists.  Tolerating heart healthy diet with thin liquids. The patient requires inpatient medicine and rehabilitation evaluations and services for ongoing  dysfunction secondary to ICH with IVH extension.  States he lives alone and his ex takes care of him.  Review of Systems  Respiratory:  Negative for cough and shortness of breath.   Cardiovascular:  Negative for chest pain and palpitations.  Gastrointestinal:  Negative for constipation and nausea.       Last BM yesterday  Genitourinary:  Negative for dysuria and urgency.  Musculoskeletal:        Complains of right hip/gluteal spasms with inactivity  Neurological:  Negative for dizziness and headaches.  Psychiatric/Behavioral:  The patient is not nervous/anxious and does not have insomnia.    Past Medical History:  Diagnosis Date   AF (paroxysmal atrial fibrillation) (HCC) 11/29/2018   Atrial flutter (HCC) 10/12/2015   a. TEE 3/17 with ? LAA clot-->s/p TEE/DCCV 11/24/2015; s/p DCCV 12/2022;s/p ablation 06/2023   CAD (coronary artery disease), native coronary artery 12/06/2015   a. 10/2015 MV: EF  37%, reversible defect inferior apex, intermediate risk findings; b. 10/2015 Cath: 20% mid RCA.   Carotid artery stenosis    1-39% right ICA stenosis and occluded left ICA   Colonic diverticular abscess    Diverticulitis    History of chemotherapy 2005   Cisplatin   Hypothyroidism    NICM (nonischemic cardiomyopathy) (HCC) 10/14/2015   a. Tachy mediated?;  b. Echo 3/17 - Mild concentric LVH, EF 30-35%, anteroseptal, anterior, anterolateral, apical anterior, lateral hypokinesis, trivial MR, mild to moderately reduced RVSF; c. LHC 3/17 - mRCA 20%   Radiation NOv.3,2005-Dec. 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  XRT   Past Surgical History:  Procedure Laterality Date   A-FLUTTER ABLATION N/A 06/19/2023   Procedure: A-FLUTTER ABLATION;  Surgeon: Cindie Ole DASEN, MD;  Location: [facility] INVASIVE CV LAB;  Service: Cardiovascular;  Laterality: N/A;   APPENDECTOMY N/A 03/07/2019   Procedure: ROBOTIC ASSISTED APPENDECTOMY;  Surgeon: Sheldon Standing, MD;   Location: [facility] ORS;  Service: General;  Laterality: N/A;   CARDIAC CATHETERIZATION N/A 10/18/2015   Procedure: Left Heart Cath and Coronary Angiography;  Surgeon: Lonni JONETTA Cash, MD;  Location: [facility] INVASIVE CV LAB;  Service: Cardiovascular;  Laterality: N/A;   CARDIOVERSION N/A 11/24/2015   Procedure: CARDIOVERSION;  Surgeon: Maude JAYSON Emmer, MD;  Location: [place] [facility] ENDOSCOPY;  Service: Cardiovascular;  Laterality: N/A;   CARDIOVERSION N/A 12/20/2022   Procedure: CARDIOVERSION;  Surgeon: Alvan Ronal BRAVO, MD;  Location: [facility] INVASIVE CV LAB;  Service: Cardiovascular;  Laterality: N/A;   CYSTOSCOPY WITH STENT PLACEMENT Bilateral 03/07/2019   Procedure: CYSTOSCOPY WITH BILATERAL FIREFLY INJECTION;  Surgeon: Cam Morene ORN, MD;  Location: [facility] ORS;  Service: Urology;  Laterality: Bilateral;   GASTROSTOMY TUBE PLACEMENT  07/04/2004   IR - G tube for tonsilar cancer   IR ANGIO INTRA EXTRACRAN SEL COM CAROTID INNOMINATE UNI R MOD SED  03/09/2019   IR ANGIO VERTEBRAL SEL VERTEBRAL UNI R MOD SED  03/09/2019   IR CT HEAD LTD  03/09/2019   IR PERCUTANEOUS ART THROMBECTOMY/INFUSION INTRACRANIAL INC DIAG ANGIO  03/09/2019   IR RADIOLOGIST EVAL & MGMT  12/18/2018   LAMINECTOMY     C5/placement of steel plate   LOOP RECORDER INSERTION N/A 06/19/2023   Procedure: LOOP RECORDER INSERTION;  Surgeon: Cindie Ole DASEN, MD;  Location: [facility] INVASIVE CV LAB;  Service: Cardiovascular;  Laterality: N/A;   NECK SURGERY  2003   replaced disk   RADIOLOGY WITH ANESTHESIA N/A 03/08/2019   Procedure: IR WITH ANESTHESIA;  Surgeon: Dolphus Carrion, MD;  Location: [facility] OR;  Service: Radiology;  Laterality: N/A;   TEE WITHOUT CARDIOVERSION N/A 10/15/2015   Procedure: TRANSESOPHAGEAL ECHOCARDIOGRAM (TEE);  Surgeon: Ezra GORMAN Shuck, MD;  Location: [facility] ENDOSCOPY;  Service: Cardiovascular;  Laterality: N/A;   TEE WITHOUT CARDIOVERSION N/A 11/24/2015   Procedure: TRANSESOPHAGEAL ECHOCARDIOGRAM (TEE);  Surgeon: Maude JAYSON Emmer, MD;  Location: [facility]  ENDOSCOPY;  Service: Cardiovascular;  Laterality: N/A;   TEE WITHOUT CARDIOVERSION N/A 12/20/2022   Procedure: TRANSESOPHAGEAL ECHOCARDIOGRAM;  Surgeon: Alvan Ronal BRAVO, MD;  Location: [facility] INVASIVE CV LAB;  Service: Cardiovascular;  Laterality: N/A;   XI ROBOTIC ASSISTED COLOSTOMY TAKEDOWN N/A 03/07/2019   Procedure: XI ROBOTIC ASSISTED LOW ANTERIOR RESECTION, RIGID PROCTOSCOPY;  Surgeon: Sheldon Standing, MD;  Location: [facility] ORS;  Service: General;  Laterality: N/A;   Family History  Problem Relation Age of Onset   Prostate cancer Father    Colon cancer Father    Skin cancer Mother    Cancer Brother    Esophageal cancer Neg Hx    Rectal cancer Neg Hx    Stomach cancer Neg Hx    Social History:  reports that he has been smoking cigarettes. He has a 10 pack-year smoking history. He has never used smokeless tobacco. He reports current alcohol use of about 12.0 standard drinks of alcohol per week. He reports that he does not use drugs. Allergies: No Known Allergies Medications Prior to Admission  Medication Sig Dispense Refill   amiodarone  (PACERONE ) 200 MG tablet Take 200 mg by mouth daily.     apixaban  (ELIQUIS ) 5 MG TABS tablet Take 1 tablet (5 mg total) by mouth 2 (  two) times daily. 180 tablet 1   levothyroxine  (SYNTHROID ) 125 MCG tablet TAKE 1 TABLET BY MOUTH EVERY DAY 90 tablet 0   metoprolol  tartrate (LOPRESSOR ) 50 MG tablet Take 1 tablet (50 mg total) by mouth 2 (two) times daily. 180 tablet 3   [DISCONTINUED] atorvastatin  (LIPITOR ) 40 MG tablet TAKE 1 TABLET BY MOUTH EVERY DAY 90 tablet 0      Home: Home Living Family/patient expects to be discharged to:: Private residence Living Arrangements: Alone Available Help at Discharge:  (exwife and their children and friends, exwife does work ? if she can get FMLA. Cared for him after last CVA) Type of Home: House Home Access: Stairs to enter [employer] of Steps: 2 Entrance Stairs-Rails: Right Home Layout: One level Bathroom  Shower/Tub: Engineer, [employer] Systems: Standard Bathroom Accessibility: Yes Home Equipment: Grab bars - tub/shower, Hand held shower head, Grab bars - toilet  Lives With: Alone   Functional History: Prior Function Prior Level of Function : Independent/Modified Independent, Driving, History of Falls (last six months) Mobility Comments: no use of DME, independent living alone ADLs Comments: independent living alone  Functional Status:  Mobility: Bed Mobility Overal bed mobility: Needs Assistance Bed Mobility: Rolling, Sidelying to Sit Rolling: Min assist Sidelying to sit: Mod assist, +2 for safety/equipment (flat HOB) Supine to sit: Min assist, +2 for physical assistance, +2 for safety/equipment, HOB elevated, Used rails General bed mobility comments: cues for sequencing--hooklying to normal roll to up via L elbow/UE.  Light mod to coordinate up to midline.  min stability while pt scoot symmetrically forward/back. Transfers Overall transfer level: Needs assistance Equipment used: None (chairback) Transfers: Sit to/from Stand Sit to Stand: Min assist, +2 safety/equipment Bed to/from chair/wheelchair/BSC transfer type:: Step pivot Step pivot transfers: Mod assist, +2 physical assistance, +2 safety/equipment General transfer comment: VC for technique and sequencing provided. Physical assist to weight shift in order to move RLE. Ambulation/Gait Ambulation/Gait assistance: Mod assist, +2 physical assistance Gait Distance (Feet): 3 Feet Assistive device: 2 person hand held assist Gait Pattern/deviations: Step-to pattern, Decreased stride length, Knee hyperextension - right, Knee flexed in stance - right General Gait Details: NT in lieu of more Pre-gait. Gait velocity: decreased Gait velocity interpretation: <1.31 ft/sec, indicative of household ambulator Pre-gait activities: 4 trials of pregait  2+ min each.  1st with w/shifting, knee control, locking unlocking,  2nd--w/shift,  unload, step forward R LE, translate forward/back step with assist avoiding drag back, 2-3 trials, 10 reps of controlled squats with,  3rd  knee control, step forward/back and transition to 3 steps L and 3 steps R with control assist and normalized sequencing.  4th all 4 activities.    ADL: ADL Overall ADL's : Needs assistance/impaired Eating/Feeding: Set up, Sitting Grooming: Set up, Sitting Upper Body Bathing: Moderate assistance, Sitting Lower Body Bathing: Maximal assistance, Sit to/from stand, Sitting/lateral leans Upper Body Dressing : Moderate assistance, Sitting Lower Body Dressing: Total assistance, Sitting/lateral leans, Sit to/from stand Toilet Transfer: Moderate assistance, +2 for physical assistance, Cueing for sequencing, Cueing for safety, Stand-pivot, BSC/3in1 Toileting- Clothing Manipulation and Hygiene: Total assistance, Sit to/from stand  Cognition: Cognition Overall Cognitive Status:  (NT formally, followed 1-2 step commands, good focus on task.) Arousal/Alertness: Awake/alert Orientation Level: Oriented X4 Attention: Sustained Sustained Attention: Impaired Sustained Attention Impairment: Verbal basic Memory: Impaired Memory Impairment: Storage deficit, Retrieval deficit Awareness: Impaired Awareness Impairment: Emergent impairment Problem Solving: Impaired Problem Solving Impairment: Verbal basic Cognition Arousal: Alert Behavior During Therapy: WFL for tasks assessed/performed Overall Cognitive  Status:  (NT formally, followed 1-2 step commands, good focus on task.) Area of Impairment: Orientation Orientation Level: Time (stated the year was 2026) General Comments: at times difficult word finding, for example stated tuesday when asked the month, then corrected to correct month  Physical Exam: Blood pressure (!) 148/81, pulse 64, temperature 97.8 F (36.6 C), temperature source Oral, resp. rate 19, height 5' 8 (1.727 m), weight 63.5 kg, SpO2  94%. Physical Exam Constitutional:      General: He is not in acute distress. HENT:     Head: Normocephalic and atraumatic.     Nose: Nose normal.  Eyes:     Extraocular Movements: Extraocular movements intact.     Conjunctiva/sclera: Conjunctivae normal.     Pupils: Pupils are equal, round, and reactive to light.  Cardiovascular:     Rate and Rhythm: Normal rate and regular rhythm.     Pulses: Normal pulses.  Pulmonary:     Effort: Pulmonary effort is normal. No respiratory distress.     Breath sounds: No wheezing.  Abdominal:     General: Abdomen is flat. There is no distension.     Tenderness: There is no abdominal tenderness.  Musculoskeletal:     Right lower leg: No edema.     Left lower leg: No edema.     Comments: Erythema of RUE PIV sites: these were both removed upon admission to CIR  Neurological:     Mental Status: He is alert.     Motor: Weakness present.     Comments: Pt is alert and oriented to person, place, month, year, reason he's here. Fair insight and awareness. Some STM deficits. Normal language. Right central 7 and mild tongue deviation with dysarthria. MMT: RUE 3+ deltoid, bicep, tricep, and 4/5 wrist and grip. RLE 3/5 HF, KE and trace ADF/PF. LUE and LLE 4+/5.  Sensory exam normal for light touch and pain in all 4 limbs. No limb ataxia or cerebellar signs. No abnormal tone appreciated although right heel cord tight.   Psychiatric:        Mood and Affect: Mood normal.        Behavior: Behavior normal.     Results for orders placed or performed during the hospital encounter of 08/11/23 (from the past 48 hours)  Basic metabolic panel     Status: Abnormal   Collection Time: 08/13/23  4:48 AM  Result Value Ref Range   Sodium 133 (L) 135 - 145 mmol/L   Potassium 3.8 3.5 - 5.1 mmol/L   Chloride 100 98 - 111 mmol/L   CO2 23 22 - 32 mmol/L   Glucose, Bld 108 (H) 70 - 99 mg/dL    Comment: Glucose reference range applies only to samples taken after fasting for  at least 8 hours.   BUN 18 8 - 23 mg/dL   Creatinine, Ser 9.08 0.61 - 1.24 mg/dL   Calcium  9.1 8.9 - 10.3 mg/dL   GFR, Estimated >39 >39 mL/min    Comment: (NOTE) Calculated using the CKD-EPI Creatinine Equation (2021)    Anion gap 10 5 - 15    Comment: Performed at [facility], [address] [place], [place] [address]  CBC     Status: Abnormal   Collection Time: 08/13/23  4:48 AM  Result Value Ref Range   WBC 2.9 (L) 4.0 - 10.5 K/uL   RBC 4.48 4.22 - 5.81 MIL/uL   Hemoglobin 15.5 13.0 - 17.0 g/dL   HCT 56.0 60.9 -  52.0 %   MCV 98.0 80.0 - 100.0 fL   MCH 34.6 (H) 26.0 - 34.0 pg   MCHC 35.3 30.0 - 36.0 g/dL   RDW 86.9 88.4 - 84.4 %   Platelets 138 (L) 150 - 400 K/uL   nRBC 0.0 0.0 - 0.2 %    Comment: Performed at [facility], [address], [place], [place] [address]  Basic metabolic panel     Status: Abnormal   Collection Time: 08/14/23  5:52 AM  Result Value Ref Range   Sodium 135 135 - 145 mmol/L   Potassium 4.1 3.5 - 5.1 mmol/L   Chloride 98 98 - 111 mmol/L   CO2 26 22 - 32 mmol/L   Glucose, Bld 102 (H) 70 - 99 mg/dL    Comment: Glucose reference range applies only to samples taken after fasting for at least 8 hours.   BUN 18 8 - 23 mg/dL   Creatinine, Ser 8.93 0.61 - 1.24 mg/dL   Calcium  9.0 8.9 - 10.3 mg/dL   GFR, Estimated >39 >39 mL/min    Comment: (NOTE) Calculated using the CKD-EPI Creatinine Equation (2021)    Anion gap 11 5 - 15    Comment: Performed at [facility], [address], [place], [place] [address]  CBC     Status: Abnormal   Collection Time: 08/14/23  5:52 AM  Result Value Ref Range   WBC 3.1 (L) 4.0 - 10.5 K/uL   RBC 4.46 4.22 - 5.81 MIL/uL   Hemoglobin 15.5 13.0 - 17.0 g/dL   HCT 55.8 60.9 - 47.9 %   MCV 98.9 80.0 - 100.0 fL   MCH 34.8 (H) 26.0 - 34.0 pg   MCHC 35.1 30.0 - 36.0 g/dL   RDW 87.1 88.4 - 84.4 %   Platelets 131 (L) 150 - 400 K/uL    Comment: REPEATED TO VERIFY   nRBC 0.0 0.0 - 0.2 %    Comment:  Performed at [facility], [address], [place], [place] [address]   No results found.    Blood pressure (!) 148/81, pulse 64, temperature 97.8 F (36.6 C), temperature source Oral, resp. rate 19, height 5' 8 (1.727 m), weight 63.5 kg, SpO2 94%.  Medical Problem List and Plan: 1. Functional deficits secondary to left basal ganglia hemorrhage d/t hypertension with subsequent right hemiparesis  -patient may  shower  -ELOS/Goals: 12-16 days, goals mod I with PT and OT and independent with SLP  2.  Antithrombotics: -DVT/anticoagulation:  Pharmaceutical: Heparin   -antiplatelet therapy: none  3. Pain Management: Tylenol  as needed  4. Mood/Behavior/Sleep: LCSW to evaluate and provide emotional support  -antipsychotic agents: n/a  5. Neuropsych/cognition: This patient is capable of making decisions on his own behalf although he appears to have some short term memory deficits  6. Skin/Wound Care: Routine skin care checks   7. Fluids/Electrolytes/Nutrition: Routine Is and Os and follow-up chemistries  8: Hypertension: monitor TID and prn. Fair control at present  -Continue amlodipine  2.5 mg daily  -Continue metoprolol  tartrate 50 mg twice daily  9: Hyperlipidemia: continue Lipitor  80 mg daily  10: Hypothyroidism: Continue Synthroid  125 mcg every morning  11: Tobacco use: Cessation counseling  12: Atrial fibrillation: Now off Eliquis  due to ICH.  Will follow-up with neurology in 2 weeks to consider aspire trial, restart Eliquis  in 2 weeks if stable and follow-up with cardiology.  13: Chronic leukopenia: Follow-up CBC  14: Mild thrombocytopenia: Follow-up CBC     @  Nena JINNY Buba, PA-C 08/14/2023

## 2023-08-14 NOTE — Progress Notes (Signed)
 Speech Language Pathology Treatment: Cognitive-Linquistic  Patient Details Name: Adrian [name] MRN: [id number] DOB: 01/09/1956 Today's Date: 08/14/2023 Time: 8459-8444 SLP Time Calculation (min) (ACUTE ONLY): 15 min  Assessment / Plan / Recommendation Clinical Impression  Patient seen by SLP for skilled treatment focused on aphasia goals. He indicated that he is close to his baseline as far as communication but not quite. When asked what he feels he needs to work on he replied, clarity of thought and stating that I can't label it and gave example of being able to describe/communicate about a football game he would want to watch but unable to name the teams. His complaints are consistent with anomia. He presents with expressive aphasia but with receptive language appearing to be [facility]. He did tell SLP that following initial CVA and subsequent OP SLP in 2021, he was able to return to work at [employer] working internally and that the guys realized I had a stroke but that was no bother. He stated he retired seven weeks ago. His main goals are for his left leg and arm to work properly. SLP is recommending SLP at CIR to work on anomia, higher level expressive language goals.     HPI HPI: Adrian [name] is a 68 yo male presenting to ED 1/4 with sudden onset R sided weakness. CTH includes ICH centered in the L basal ganglia with intraventricular extension. Pt seen August-October 2020 for dysphagia and cognitive by SLP targeting executive functioning, memory, attention, aphasia, and anomia.  PMH includes A-fib, carotid artery stenosis, hypothyroidism, HLD      SLP Plan  All goals met      Recommendations for follow up therapy are one component of a multi-disciplinary discharge planning process, led by the attending physician.  Recommendations may be updated based on patient status, additional functional criteria and insurance authorization.    Recommendations   SLP at CIR                       Set up Supervision/Assistance Aphasia (R47.01)     All goals met   Norleen IVAR Blase, MA, CCC-SLP Speech Therapy

## 2023-08-14 NOTE — Progress Notes (Signed)
 Babs Arthea DASEN, MD  Physician Physical Medicine and Rehabilitation   PMR Pre-admission     Addendum   Date of Service: 08/14/2023  3:26 PM  Related encounter: ED to Hosp-Admission (Discharged) from 08/11/2023 in [facility] NEURO/TRAUMA/SURGICAL ICU   Expand All Collapse All  Show:Clear all [x] Written[x] Templated[x] Copied  Added by: [x] Alison Heron MATSU, RN  [] Hover for details PMR Admission Coordinator Pre-Admission Assessment   Patient: Adrian [name] is an 68 y.o., male MRN: [id number] DOB: [birth year] Height: 5' 8 (172.7 cm) Weight: 63.5 kg                                                                                                                                                  Insurance Information HMO:     PPO: yes     PCP:      IPA:      80/20:      OTHER:  PRIMARY: [employer] [place]       Policy#: [id number]      Subscriber: pt CM Name: via portal approval      Phone#: [phone]    Fax#: [phone] Pre-Cert#: [id number]  approved 1/7 until 1/21     Employer: retired [employer] Benefits:  Phone #: [phone]     Name: 1/6 Eff. Date: 01/05/2013 until 08/06/9998     Deduct: $100      Out of Pocket Max: $1000      Life Max: none  CIR: 80%      SNF: 80% Outpatient: 80%     Co-Pay:  Home Health: 80%      Co-Pay:  DME: 80%     Co-Pay:  Providers: in network  SECONDARY: Medicare a and b      Policy#: [id number]      active 05/08/23 per passport one source online   THIRD: AARP medicare supplement policy # [id number]   Financial Counselor:       Phone#:    The "Data Collection Information Summary" for patients in Inpatient Rehabilitation Facilities with attached "Privacy Act Statement-Health Care Records" was provided and verbally reviewed with: Patient and Family   Emergency Contact Information Contact Information       Name Relation Home Work Mobile    [place] Spouse [phone]   [phone]    Asiah, Befort Daughter [phone]             Other  Contacts       Name Relation Home Work Mobile    Gent,Ben Son [phone]             Current Medical History  Patient Admitting Diagnosis: ICH   History of Present Illness: Adrian [name] is a 68 y.o. male with a history of atrial fibrillation on Eliquis , hypothyroidism and carotid artery stenosis who presented on 08/11/2023 with right sided weakness.    Patient was hypertensive  upon arrival of EMS.  CT of the head demonstrated large basal ganglia intracranial hemorrhage with intraventricular hemorrhage extension.  CTA of the head and neck demonstrated spot sign with left basal ganglia hematoma.  No aneurysms, malformations or large vessel occlusions were identified.  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  Patient's Eliquis  was held and blood pressure was controlled. On Eliquis  prior to admit. Now on no antithrombotic due to ICH. Will follow up as OP with Dr Rosemarie to consider ASPIRE trail if interested or restart ELiquis  in 2 weeks if stable, or follow up with cardiology for other options including Watchman device.    Lopressor  as at home med restarted. Ablation recently 06/19/23. LOOP recorder recently placed 06/19/23. Has been referred to Dr Gretta with vascular surgery due to evidence of consistent total occlusion of the left ICA 07/09/23. Home Lipitor  increased from 40 mg to 80 with LDL 117. On home synthroid  .   Complete NIHSS TOTAL: 7 Glasgow Coma Scale Score: 15   Patient[facility] from [facility] has been reviewed by the rehabilitation admission coordinator and physician.   Past Medical History      Past Medical History:  Diagnosis Date   AF (paroxysmal atrial fibrillation) (HCC) 11/29/2018   Atrial flutter (HCC) 10/12/2015    a. TEE 3/17 with ? LAA clot-->s/p TEE/DCCV 11/24/2015; s/p DCCV 12/2022;s/p ablation 06/2023   CAD (coronary artery disease), native coronary artery 12/06/2015    a. 10/2015 MV: EF  37%, reversible  defect inferior apex, intermediate risk findings; b. 10/2015 Cath: 20% mid RCA.   Carotid artery stenosis      1-39% right ICA stenosis and occluded left ICA   Colonic diverticular abscess     Diverticulitis     History of chemotherapy 2005    Cisplatin   Hypothyroidism     NICM (nonischemic cardiomyopathy) (HCC) 10/14/2015    a. Tachy mediated?;  b. Echo 3/17 - Mild concentric LVH, EF 30-35%, anteroseptal, anterior, anterolateral, apical anterior, lateral hypokinesis, trivial MR, mild to moderately reduced RVSF; c. LHC 3/17 - mRCA 20%   Radiation NOv.3,2005-Dec. 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  XRT        Has the patient had major surgery during 100 days prior to admission? yes   Family History  family history includes Cancer in his brother; Colon cancer in his father; Prostate cancer in his father; Skin cancer in his mother.   Current Medications   Current Medications    Current Facility-Administered Medications:    acetaminophen  (TYLENOL ) tablet 650 mg, 650 mg, Oral, Q4H PRN, 650 mg at 08/13/23 2234 **OR** acetaminophen  (TYLENOL ) 160 MG/5ML solution 650 mg, 650 mg, Per Tube, Q4H PRN **OR** acetaminophen  (TYLENOL ) suppository 650 mg, 650 mg, Rectal, Q4H PRN, Khaliqdina, Salman, MD   [START ON 08/15/2023] amLODipine  (NORVASC ) tablet 2.5 mg, 2.5 mg, Oral, Daily, Ghimire, Kuber, MD   atorvastatin  (LIPITOR ) tablet 80 mg, 80 mg, Oral, Daily, Jerri Pfeiffer, MD, 80 mg at 08/14/23 9061   Chlorhexidine  Gluconate Cloth 2 % PADS 6 each, 6 each, Topical, Daily, Jerri Pfeiffer, MD, 6 each at 08/14/23 1412   heparin  injection 5,000 Units, 5,000 Units, Subcutaneous, Q8H, Jerri Pfeiffer, MD, 5,000 Units at 08/14/23 1412   levothyroxine  (SYNTHROID ) tablet 125 mcg, 125 mcg, Oral, Q0600, Judithe Longs C, NP, 125 mcg at 08/14/23 0619   metoprolol  tartrate (LOPRESSOR ) tablet 50  mg, 50 mg, Oral, BID, Lehner, Erin C, NP, 50 mg at 08/13/23 2237   Oral care mouth  rinse, 15 mL, Mouth Rinse, PRN, Jerri Pfeiffer, MD   senna-docusate (Senokot-S) tablet 1 tablet, 1 tablet, Oral, BID, Khaliqdina, Salman, MD, 1 tablet at 08/13/23 2237   sodium chloride  flush (NS) 0.9 % injection 3 mL, 3 mL, Intravenous, Once, Towana Ozell BROCKS, MD     Patients Current Diet:  Diet Order                  Diet - low sodium heart healthy             Diet Heart Room service appropriate? Yes; Fluid consistency: Thin  Diet effective now                       Precautions / Restrictions Precautions Precautions: Fall Precaution Comments: R hemi Restrictions Weight Bearing Restrictions Per Provider Order: No    Has the patient had 2 or more falls or a fall with injury in the past year? yes   Prior Activity Level Community (5-7x/wk): independent, driving, retired 6 weeks prior to admit from [employer]   Prior Functional Level Prior Function Prior Level of Function : Independent/Modified Independent, Driving, History of Falls (last six months) Mobility Comments: no use of DME, independent living alone ADLs Comments: independent living alone   Self Care: Did the patient need help bathing, dressing, using the toilet or eating?  Independent   Indoor Mobility: Did the patient need assistance with walking from room to room (with or without device)? Independent   Stairs: Did the patient need assistance with internal or external stairs (with or without device)? Independent   Functional Cognition: Did the patient need help planning regular tasks such as shopping or remembering to take medications? Independent   Patient Information Are you of Hispanic, Latino/a,or Spanish origin?: A. No, not of Hispanic, Latino/a, or Spanish origin What is your race?: A. White Do you need or want an interpreter to communicate with a doctor or health care staff?: 0. No   Patient's Response To:  Health Literacy and Transportation Is the patient able to respond to health literacy and  transportation needs?: Yes Health Literacy - How often do you need to have someone help you when you read instructions, pamphlets, or other written material from your doctor or pharmacy?: Never In the past 12 months, has lack of transportation kept you from medical appointments or from getting medications?: No In the past 12 months, has lack of transportation kept you from meetings, work, or from getting things needed for daily living?: No   Home Assistive Devices / Equipment Home Equipment: Grab bars - tub/shower, Hand held shower head, Grab bars - toilet   Prior Device Use: Indicate devices/aids used by the patient prior to current illness, exacerbation or injury? None of the above   Current Functional Level Cognition   Arousal/Alertness: Awake/alert Overall Cognitive Status:  (NT formally, followed 1-2 step commands, good focus on task.) Orientation Level: Oriented X4 General Comments: at times difficult word finding, for example stated tuesday when asked the month, then corrected to correct month Attention: Sustained Sustained Attention: Impaired Sustained Attention Impairment: Verbal basic Memory: Impaired Memory Impairment: Storage deficit, Retrieval deficit Awareness: Impaired Awareness Impairment: Emergent impairment Problem Solving: Impaired Problem Solving Impairment: Verbal basic    Extremity Assessment (includes Sensation/Coordination)   Upper Extremity Assessment: Defer to OT evaluation RUE Deficits / Details: A/ROM shoulder flexion ~  25% range, elbow flexion 50% range, wrist flexion/extension and supination/pronation WFL. Able to demonstrate full finger extension and flexion. MMT: 3-/5 shoulder flexion, elbow flexion/extension, 3/5 wrist supination/pronation, 3/5 wrist flexion, 3-/5 wrist extension, impaired gross grasp. RUE Sensation: decreased proprioception RUE Coordination: decreased gross motor, decreased fine motor  Lower Extremity Assessment: RLE  deficits/detail RLE Deficits / Details: limited stregnth, control, power. able to move against gravity but hyperextends in stance. limited hip flexion and poor advancement for steps RLE Sensation: decreased light touch RLE Coordination: decreased fine motor, decreased gross motor     ADLs   Overall ADL's : Needs assistance/impaired Eating/Feeding: Set up, Sitting Grooming: Set up, Sitting Upper Body Bathing: Moderate assistance, Sitting Lower Body Bathing: Maximal assistance, Sit to/from stand, Sitting/lateral leans Upper Body Dressing : Moderate assistance, Sitting Lower Body Dressing: Total assistance, Sitting/lateral leans, Sit to/from stand Toilet Transfer: Moderate assistance, +2 for physical assistance, Cueing for sequencing, Cueing for safety, Stand-pivot, BSC/3in1 Toileting- Clothing Manipulation and Hygiene: Total assistance, Sit to/from stand     Mobility   Overal bed mobility: Needs Assistance Bed Mobility: Rolling, Sidelying to Sit Rolling: Min assist Sidelying to sit: Mod assist, +2 for safety/equipment (flat HOB) Supine to sit: Min assist, +2 for physical assistance, +2 for safety/equipment, HOB elevated, Used rails General bed mobility comments: cues for sequencing--hooklying to normal roll to up via L elbow/UE.  Light mod to coordinate up to midline.  min stability while pt scoot symmetrically forward/back.     Transfers   Overall transfer level: Needs assistance Equipment used: None (chairback) Transfers: Sit to/from Stand Sit to Stand: Min assist, +2 safety/equipment Bed to/from chair/wheelchair/BSC transfer type:: Step pivot Step pivot transfers: Mod assist, +2 physical assistance, +2 safety/equipment General transfer comment: VC for technique and sequencing provided. Physical assist to weight shift in order to move RLE.     Ambulation / Gait / Stairs / Wheelchair Mobility   Ambulation/Gait Ambulation/Gait assistance: Mod assist, +2 physical assistance Gait  Distance (Feet): 3 Feet Assistive device: 2 person hand held assist Gait Pattern/deviations: Step-to pattern, Decreased stride length, Knee hyperextension - right, Knee flexed in stance - right General Gait Details: NT in lieu of more Pre-gait. Gait velocity: decreased Gait velocity interpretation: <1.31 ft/sec, indicative of household ambulator Pre-gait activities: 4 trials of pregait  2+ min each.  1st with w/shifting, knee control, locking unlocking,  2nd--w/shift, unload, step forward R LE, translate forward/back step with assist avoiding drag back, 2-3 trials, 10 reps of controlled squats with,  3rd  knee control, step forward/back and transition to 3 steps L and 3 steps R with control assist and normalized sequencing.  4th all 4 activities.     Posture / Balance Dynamic Sitting Balance Sitting balance - Comments: sitting EOB Balance Overall balance assessment: Needs assistance, History of Falls Sitting-balance support: No upper extremity supported, Single extremity supported, Feet supported Sitting balance-Leahy Scale: Fair Sitting balance - Comments: sitting EOB Postural control: Posterior lean (standing) Standing balance support: Bilateral upper extremity supported, During functional activity Standing balance-Leahy Scale: Poor Standing balance comment: trunk, shd girdle, pelvis and R knee work and controlled with min to mod assist at given times.     Special needs/care consideration      Previous Home Environment  Living Arrangements: Alone  Lives With: Alone Available Help at Discharge:  (exwife and their children and friends, exwife does work ? if she can get FMLA. Cared for him after last CVA) Type of Home: House Home Layout: One level Home  Access: Stairs to enter Entrance Stairs-Rails: Right Entrance Stairs-Number of Steps: 2 Bathroom Shower/Tub: Associate Professor: Yes How Accessible: Accessible via walker   Discharge  Living Setting Plans for Discharge Living Setting: House (Will stay with ex wife) Discharge Home Layout: One level Discharge Home Access: Stairs to enter Entrance Stairs-Number of Steps: 2 Discharge Bathroom Shower/Tub: Tub/shower unit Discharge Bathroom Toilet: Standard   Social/Family/Support Systems Anticipated Caregiver: Ex wife, [name] Anticipated Industrial/product Designer Information: [phone] Caregiver Availability: 24/7 Discharge Plan Discussed with Primary Caregiver: Yes Is Caregiver In Agreement with Plan?: Yes Does Caregiver/Family have Issues with Lodging/Transportation while Pt is in Rehab?: No   Goals Patient/Family Goal for Rehab: Mod I PT, OT, Independent SLP Expected length of stay: 12-16 days Pt/Family Agrees to Admission and willing to participate: Yes Program Orientation Provided & Reviewed with Pt/Caregiver Including Roles  & Responsibilities: Yes  Barriers to Discharge: Insurance for SNF coverage   Decrease burden of Care through IP rehab admission: n/a   Possible need for SNF placement upon discharge:not anticipated   Patient Condition: This patient's condition remains as documented in the consult dated 08/13/23, in which the Rehabilitation Physician determined and documented that the patient's condition is appropriate for intensive rehabilitative care in an inpatient rehabilitation facility. Will admit to inpatient rehab today.   Preadmission Screen Completed By:  Alison Heron Lot, RN MSN 08/14/2023 4:18 PM ______________________________________________________________________   Discussed status with Dr. Babs on 08/14/23 at 1618 and received approval for admission today.   Admission Coordinator:  Alison Heron Lot, RN MSN time [id number] Date 08/14/23        Revision History

## 2023-08-14 NOTE — H&P (Signed)
 Physical Medicine and Rehabilitation Admission H&P     CC: Functional deficits secondary to basal ganglia intracranial hemorrhage with IVH extension    HPI: Adrian [name] is a 68 year old male who presented to the emergency department on 08/11/2023 reporting he woke up in his usual state of health and at approximately 11 AM noted abrupt onset of right-sided weakness.  He was brought to the emergency department as code stroke.  He was hypertensive to systolic blood pressure to 230 per EMS.  CT scan of the head without contrast revealed left basal ganglia intracranial hemorrhage with intraventricular extension.  CTA showed spots sign and blood pressure parameters were tightened to a goal of 110-130.  He was admitted to the ICU for strict blood pressure control.  His past medical history significant for atrial fibrillation on Eliquis , carotid artery stenosis, hypothyroidism, dispose loop recorder placed 06/19/2023 post A-fib ablation, chronic left ICA occlusion, tobacco use, hyperlipidemia, chronic leukopenia.  Examination he exhibited right-sided facial droop, right upper and lower extremity weakness and dysarthria.  2D echo performed with left ventricular ejection fraction 60 to 65%.  Hemoglobin A1c 5.6%.  SCDs placed for VTE prophylaxis.  The patient will follow-up with Dr. Rosemarie as an outpatient to consider ASPIRE trial if interested.  Alternatively to restart Eliquis  in 2 weeks if stable or follow-up with cardiology for other options including Watchman device.  He continues on amlodipine  2.5 mg daily and Lopressor  25 mg daily.  Lipitor  increased to 80 mg daily.  Resume home Synthroid .  To resume amiodarone  200 mg daily on admission to CIR.  Now on heparin  5000 units 3 times daily for DVT prophylaxis.  Advised regarding tobacco and alcohol use.  Right hemiparesis persists.  Tolerating heart healthy diet with thin liquids. The patient requires inpatient medicine and rehabilitation evaluations and services for  ongoing dysfunction secondary to ICH with IVH extension.   States he lives alone and his ex takes care of him.   Review of Systems  Respiratory:  Negative for cough and shortness of breath.   Cardiovascular:  Negative for chest pain and palpitations.  Gastrointestinal:  Negative for constipation and nausea.       Last BM yesterday  Genitourinary:  Negative for dysuria and urgency.  Musculoskeletal:        Complains of right hip/gluteal spasms with inactivity  Neurological:  Negative for dizziness and headaches.  Psychiatric/Behavioral:  The patient is not nervous/anxious and does not have insomnia.         Past Medical History:  Diagnosis Date   AF (paroxysmal atrial fibrillation) (HCC) 11/29/2018   Atrial flutter (HCC) 10/12/2015    a. TEE 3/17 with ? LAA clot-->s/p TEE/DCCV 11/24/2015; s/p DCCV 12/2022;s/p ablation 06/2023   CAD (coronary artery disease), native coronary artery 12/06/2015    a. 10/2015 MV: EF  37%, reversible defect inferior apex, intermediate risk findings; b. 10/2015 Cath: 20% mid RCA.   Carotid artery stenosis      1-39% right ICA stenosis and occluded left ICA   Colonic diverticular abscess     Diverticulitis     History of chemotherapy 2005    Cisplatin   Hypothyroidism     NICM (nonischemic cardiomyopathy) (HCC) 10/14/2015    a. Tachy mediated?;  b. Echo 3/17 - Mild concentric LVH, EF 30-35%, anteroseptal, anterior, anterolateral, apical anterior, lateral hypokinesis, trivial MR, mild to moderately reduced RVSF; c. LHC 3/17 - mRCA 20%   Radiation NOv.3,2005-Dec. 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  XRT             Past Surgical History:  Procedure Laterality Date   A-FLUTTER ABLATION N/A 06/19/2023    Procedure: A-FLUTTER ABLATION;  Surgeon: Cindie Ole DASEN, MD;  Location: [facility] INVASIVE CV LAB;  Service: Cardiovascular;  Laterality: N/A;   APPENDECTOMY N/A 03/07/2019    Procedure: ROBOTIC ASSISTED  APPENDECTOMY;  Surgeon: Sheldon Standing, MD;  Location: [facility] ORS;  Service: General;  Laterality: N/A;   CARDIAC CATHETERIZATION N/A 10/18/2015    Procedure: Left Heart Cath and Coronary Angiography;  Surgeon: Lonni JONETTA Cash, MD;  Location: [facility] INVASIVE CV LAB;  Service: Cardiovascular;  Laterality: N/A;   CARDIOVERSION N/A 11/24/2015    Procedure: CARDIOVERSION;  Surgeon: Maude JAYSON Emmer, MD;  Location: [facility] ENDOSCOPY;  Service: Cardiovascular;  Laterality: N/A;   CARDIOVERSION N/A 12/20/2022    Procedure: CARDIOVERSION;  Surgeon: Alvan Ronal BRAVO, MD;  Location: [facility] INVASIVE CV LAB;  Service: Cardiovascular;  Laterality: N/A;   CYSTOSCOPY WITH STENT PLACEMENT Bilateral 03/07/2019    Procedure: CYSTOSCOPY WITH BILATERAL FIREFLY INJECTION;  Surgeon: Cam Morene ORN, MD;  Location: [facility] ORS;  Service: Urology;  Laterality: Bilateral;   GASTROSTOMY TUBE PLACEMENT   07/04/2004    IR - G tube for tonsilar cancer   IR ANGIO INTRA EXTRACRAN SEL COM CAROTID INNOMINATE UNI R MOD SED   03/09/2019   IR ANGIO VERTEBRAL SEL VERTEBRAL UNI R MOD SED   03/09/2019   IR CT HEAD LTD   03/09/2019   IR PERCUTANEOUS ART THROMBECTOMY/INFUSION INTRACRANIAL INC DIAG ANGIO   03/09/2019   IR RADIOLOGIST EVAL & MGMT   12/18/2018   LAMINECTOMY        C5/placement of steel plate   LOOP RECORDER INSERTION N/A 06/19/2023    Procedure: LOOP RECORDER INSERTION;  Surgeon: Cindie Ole DASEN, MD;  Location: [facility] INVASIVE CV LAB;  Service: Cardiovascular;  Laterality: N/A;   NECK SURGERY   2003    replaced disk   RADIOLOGY WITH ANESTHESIA N/A 03/08/2019    Procedure: IR WITH ANESTHESIA;  Surgeon: Dolphus Carrion, MD;  Location: [facility] OR;  Service: Radiology;  Laterality: N/A;   TEE WITHOUT CARDIOVERSION N/A 10/15/2015    Procedure: TRANSESOPHAGEAL ECHOCARDIOGRAM (TEE);  Surgeon: Ezra GORMAN Shuck, MD;  Location: [facility] ENDOSCOPY;  Service: Cardiovascular;  Laterality: N/A;   TEE WITHOUT CARDIOVERSION N/A 11/24/2015    Procedure: TRANSESOPHAGEAL  ECHOCARDIOGRAM (TEE);  Surgeon: Maude JAYSON Emmer, MD;  Location: [facility] ENDOSCOPY;  Service: Cardiovascular;  Laterality: N/A;   TEE WITHOUT CARDIOVERSION N/A 12/20/2022    Procedure: TRANSESOPHAGEAL ECHOCARDIOGRAM;  Surgeon: Alvan Ronal BRAVO, MD;  Location: [facility] INVASIVE CV LAB;  Service: Cardiovascular;  Laterality: N/A;   XI ROBOTIC ASSISTED COLOSTOMY TAKEDOWN N/A 03/07/2019    Procedure: XI ROBOTIC ASSISTED LOW ANTERIOR RESECTION, RIGID PROCTOSCOPY;  Surgeon: Sheldon Standing, MD;  Location: [facility] ORS;  Service: General;  Laterality: N/A;             Family History  Problem Relation Age of Onset   Prostate cancer Father     Colon cancer Father     Skin cancer Mother     Cancer Brother     Esophageal cancer Neg Hx     Rectal cancer Neg Hx     Stomach cancer Neg Hx          Social History:  reports that he has been smoking cigarettes. He has a  10 pack-year smoking history. He has never used smokeless tobacco. He reports current alcohol use of about 12.0 standard drinks of alcohol per week. He reports that he does not use drugs. Allergies:  Allergies  No Known Allergies         Medications Prior to Admission  Medication Sig Dispense Refill   amiodarone  (PACERONE ) 200 MG tablet Take 200 mg by mouth daily.       apixaban  (ELIQUIS ) 5 MG TABS tablet Take 1 tablet (5 mg total) by mouth 2 (two) times daily. 180 tablet 1   levothyroxine  (SYNTHROID ) 125 MCG tablet TAKE 1 TABLET BY MOUTH EVERY DAY 90 tablet 0   metoprolol  tartrate (LOPRESSOR ) 50 MG tablet Take 1 tablet (50 mg total) by mouth 2 (two) times daily. 180 tablet 3   [DISCONTINUED] atorvastatin  (LIPITOR ) 40 MG tablet TAKE 1 TABLET BY MOUTH EVERY DAY 90 tablet 0              Home: Home Living Family/patient expects to be discharged to:: Private residence Living Arrangements: Alone Available Help at Discharge:  (exwife and their children and friends, exwife does work ? if she can get FMLA. Cared for him after last CVA) Type of Home:  House Home Access: Stairs to enter [employer] of Steps: 2 Entrance Stairs-Rails: Right Home Layout: One level Bathroom Shower/Tub: Engineer[employer] Systems: Standard Bathroom Accessibility: Yes Home Equipment: Grab bars - tub/shower, Hand held shower head, Grab bars - toilet  Lives With: Alone   Functional History: Prior Function Prior Level of Function : Independent/Modified Independent, Driving, History of Falls (last six months) Mobility Comments: no use of DME, independent living alone ADLs Comments: independent living alone   Functional Status:  Mobility: Bed Mobility Overal bed mobility: Needs Assistance Bed Mobility: Rolling, Sidelying to Sit Rolling: Min assist Sidelying to sit: Mod assist, +2 for safety/equipment (flat HOB) Supine to sit: Min assist, +2 for physical assistance, +2 for safety/equipment, HOB elevated, Used rails General bed mobility comments: cues for sequencing--hooklying to normal roll to up via L elbow/UE.  Light mod to coordinate up to midline.  min stability while pt scoot symmetrically forward/back. Transfers Overall transfer level: Needs assistance Equipment used: None (chairback) Transfers: Sit to/from Stand Sit to Stand: Min assist, +2 safety/equipment Bed to/from chair/wheelchair/BSC transfer type:: Step pivot Step pivot transfers: Mod assist, +2 physical assistance, +2 safety/equipment General transfer comment: VC for technique and sequencing provided. Physical assist to weight shift in order to move RLE. Ambulation/Gait Ambulation/Gait assistance: Mod assist, +2 physical assistance Gait Distance (Feet): 3 Feet Assistive device: 2 person hand held assist Gait Pattern/deviations: Step-to pattern, Decreased stride length, Knee hyperextension - right, Knee flexed in stance - right General Gait Details: NT in lieu of more Pre-gait. Gait velocity: decreased Gait velocity interpretation: <1.31 ft/sec, indicative of household  ambulator Pre-gait activities: 4 trials of pregait  2+ min each.  1st with w/shifting, knee control, locking unlocking,  2nd--w/shift, unload, step forward R LE, translate forward/back step with assist avoiding drag back, 2-3 trials, 10 reps of controlled squats with,  3rd  knee control, step forward/back and transition to 3 steps L and 3 steps R with control assist and normalized sequencing.  4th all 4 activities.   ADL: ADL Overall ADL's : Needs assistance/impaired Eating/Feeding: Set up, Sitting Grooming: Set up, Sitting Upper Body Bathing: Moderate assistance, Sitting Lower Body Bathing: Maximal assistance, Sit to/from stand, Sitting/lateral leans Upper Body Dressing : Moderate assistance, Sitting Lower Body Dressing: Total assistance,  Sitting/lateral leans, Sit to/from stand Toilet Transfer: Moderate assistance, +2 for physical assistance, Cueing for sequencing, Cueing for safety, Stand-pivot, BSC/3in1 Toileting- Clothing Manipulation and Hygiene: Total assistance, Sit to/from stand   Cognition: Cognition Overall Cognitive Status:  (NT formally, followed 1-2 step commands, good focus on task.) Arousal/Alertness: Awake/alert Orientation Level: Oriented X4 Attention: Sustained Sustained Attention: Impaired Sustained Attention Impairment: Verbal basic Memory: Impaired Memory Impairment: Storage deficit, Retrieval deficit Awareness: Impaired Awareness Impairment: Emergent impairment Problem Solving: Impaired Problem Solving Impairment: Verbal basic Cognition Arousal: Alert Behavior During Therapy: WFL for tasks assessed/performed Overall Cognitive Status:  (NT formally, followed 1-2 step commands, good focus on task.) Area of Impairment: Orientation Orientation Level: Time (stated the year was 2026) General Comments: at times difficult word finding, for example stated tuesday when asked the month, then corrected to correct month   Physical Exam: Blood pressure (!) 148/81,  pulse 64, temperature 97.8 F (36.6 C), temperature source Oral, resp. rate 19, height 5' 8 (1.727 m), weight 63.5 kg, SpO2 94%. Physical Exam Constitutional:      General: He is not in acute distress. HENT:     Head: Normocephalic and atraumatic.     Nose: Nose normal.  Eyes:     Extraocular Movements: Extraocular movements intact.     Conjunctiva/sclera: Conjunctivae normal.     Pupils: Pupils are equal, round, and reactive to light.  Cardiovascular:     Rate and Rhythm: Normal rate and regular rhythm.     Pulses: Normal pulses.  Pulmonary:     Effort: Pulmonary effort is normal. No respiratory distress.     Breath sounds: No wheezing.  Abdominal:     General: Abdomen is flat. There is no distension.     Tenderness: There is no abdominal tenderness.  Musculoskeletal:     Right lower leg: No edema.     Left lower leg: No edema.     Comments: Erythema of RUE PIV sites: these were both removed upon admission to CIR  Neurological:     Mental Status: He is alert.     Motor: Weakness present.     Comments: Pt is alert and oriented to person, place, month, year, reason he's here. Fair insight and awareness. Some STM deficits. Normal language. Right central 7 and mild tongue deviation with dysarthria. MMT: RUE 3+ deltoid, bicep, tricep, and 4/5 wrist and grip. RLE 3/5 HF, KE and trace ADF/PF. LUE and LLE 4+/5.  Sensory exam normal for light touch and pain in all 4 limbs. No limb ataxia or cerebellar signs. No abnormal tone appreciated although right heel cord tight.   Psychiatric:        Mood and Affect: Mood normal.        Behavior: Behavior normal.        Lab Results Last 48 Hours        Results for orders placed or performed during the hospital encounter of 08/11/23 (from the past 48 hours)  Basic metabolic panel     Status: Abnormal    Collection Time: 08/13/23  4:48 AM  Result Value Ref Range    Sodium 133 (L) 135 - 145 mmol/L    Potassium 3.8 3.5 - 5.1 mmol/L    Chloride  100 98 - 111 mmol/L    CO2 23 22 - 32 mmol/L    Glucose, Bld 108 (H) 70 - 99 mg/dL      Comment: Glucose reference range applies only to samples taken after fasting for at least 8  hours.    BUN 18 8 - 23 mg/dL    Creatinine, Ser 9.08 0.61 - 1.24 mg/dL    Calcium  9.1 8.9 - 10.3 mg/dL    GFR, Estimated >39 >39 mL/min      Comment: (NOTE) Calculated using the CKD-EPI Creatinine Equation (2021)      Anion gap 10 5 - 15      Comment: Performed at [facility], [address] [place], [place] [address]  CBC     Status: Abnormal    Collection Time: 08/13/23  4:48 AM  Result Value Ref Range    WBC 2.9 (L) 4.0 - 10.5 K/uL    RBC 4.48 4.22 - 5.81 MIL/uL    Hemoglobin 15.5 13.0 - 17.0 g/dL    HCT 56.0 60.9 - 47.9 %    MCV 98.0 80.0 - 100.0 fL    MCH 34.6 (H) 26.0 - 34.0 pg    MCHC 35.3 30.0 - 36.0 g/dL    RDW 86.9 88.4 - 84.4 %    Platelets 138 (L) 150 - 400 K/uL    nRBC 0.0 0.0 - 0.2 %      Comment: Performed at [facility], [address] [place], [place] [address]  Basic metabolic panel     Status: Abnormal    Collection Time: 08/14/23  5:52 AM  Result Value Ref Range    Sodium 135 135 - 145 mmol/L    Potassium 4.1 3.5 - 5.1 mmol/L    Chloride 98 98 - 111 mmol/L    CO2 26 22 - 32 mmol/L    Glucose, Bld 102 (H) 70 - 99 mg/dL      Comment: Glucose reference range applies only to samples taken after fasting for at least 8 hours.    BUN 18 8 - 23 mg/dL    Creatinine, Ser 8.93 0.61 - 1.24 mg/dL    Calcium  9.0 8.9 - 10.3 mg/dL    GFR, Estimated >39 >39 mL/min      Comment: (NOTE) Calculated using the CKD-EPI Creatinine Equation (2021)      Anion gap 11 5 - 15      Comment: Performed at [facility], [address], [place], [place] [address]  CBC     Status: Abnormal    Collection Time: 08/14/23  5:52 AM  Result Value Ref Range    WBC 3.1 (L) 4.0 - 10.5 K/uL    RBC 4.46 4.22 - 5.81 MIL/uL    Hemoglobin 15.5 13.0 - 17.0 g/dL    HCT 55.8 60.9 - 47.9 %    MCV  98.9 80.0 - 100.0 fL    MCH 34.8 (H) 26.0 - 34.0 pg    MCHC 35.1 30.0 - 36.0 g/dL    RDW 87.1 88.4 - 84.4 %    Platelets 131 (L) 150 - 400 K/uL      Comment: REPEATED TO VERIFY    nRBC 0.0 0.0 - 0.2 %      Comment: Performed at [facility], [address], [place], [place] [address]      Imaging Results (Last 48 hours)  No results found.         Blood pressure (!) 148/81, pulse 64, temperature 97.8 F (36.6 C), temperature source Oral, resp. rate 19, height 5' 8 (1.727 m), weight 63.5 kg, SpO2 94%.   Medical Problem List and Plan: 1. Functional deficits secondary to left basal ganglia hemorrhage d/t hypertension with subsequent  right hemiparesis             -patient may  shower             -ELOS/Goals: 12-16 days, goals mod I with PT and OT and independent with SLP   -PRAFO while in bed to stretch right heel cord. I suspect he will need an AFO for gait. 2.  Antithrombotics: -DVT/anticoagulation:  Pharmaceutical: Heparin              -antiplatelet therapy: none   3. Pain Management: Tylenol  as needed   4. Mood/Behavior/Sleep: LCSW to evaluate and provide emotional support             -antipsychotic agents: n/a   5. Neuropsych/cognition: This patient is capable of making decisions on his own behalf although he appears to have some short term memory deficits   6. Skin/Wound Care: Routine skin care checks   7. Fluids/Electrolytes/Nutrition: Routine Is and Os and follow-up chemistries   8: Hypertension: monitor TID and prn. Fair control at present             -Continue amlodipine  2.5 mg daily             -Continue metoprolol  tartrate 50 mg twice daily   9: Hyperlipidemia: continue Lipitor  80 mg daily   10: Hypothyroidism: Continue Synthroid  125 mcg every morning   11: Tobacco use: Cessation counseling   12: Atrial fibrillation: Now off Eliquis  due to ICH.  Will follow-up with neurology in 2 weeks to consider aspire trial, restart Eliquis  in 2 weeks if stable and  follow-up with cardiology.   13: Chronic leukopenia: Follow-up CBC   14: Mild thrombocytopenia: Follow-up CBC         @Sandra  JINNY Buba, PA-C 08/14/2023  I have personally performed a face to face diagnostic evaluation of this patient and formulated the key components of the plan.  Additionally, I have personally reviewed laboratory data, imaging studies, as well as relevant notes and concur with the physician assistant's documentation above.  The patient's status has not changed from the original H&P.  Any changes in documentation from the acute care chart have been noted above.  Arthea IVAR Gunther, MD, LEELLEN

## 2023-08-14 NOTE — Progress Notes (Signed)
 Patient arrived and oriented to unit routines. No questions or concerns at this time. Alert and oriented to 4. Verbalized willingness to follow unit safety procedures.

## 2023-08-14 NOTE — Progress Notes (Signed)
 Inpatient Rehabilitation Admissions Coordinator   [employer]BS if [place]  state they are primary over Medicare. We began Auth on 1/6 and await their approval for CIR admit. I spoke with patient at bedside as well as his ex wife, Niels, by phone. Niels to provide me with his AARP/U[employer] card clarification today of whether he has supplement or [facility] medicare advantage plan for 2025.  Heron Leavell, RN, MSN Rehab Admissions Coordinator [phone] 08/14/2023 11:31 AM

## 2023-08-14 NOTE — Progress Notes (Signed)
 Inpatient Rehabilitation Admissions Coordinator   I have insurance approval and CIR bed to admit him to today. I will make the arrangements. Dr Raenelle and acute team made aware as well as ex wife, Niels and patient.  Heron Leavell, RN, MSN Rehab Admissions Coordinator [phone] 08/14/2023 4:16 PM

## 2023-08-14 NOTE — Progress Notes (Addendum)
 Met patient and wife at bedside  discussed about rehab schedule; educational materials regarding disease and secondary risks management including AFib, HTN, HLD with Medications and heart healthy diet along with  smoking cessation. Continue to follow along to discharge to address educational needs. [place] RN

## 2023-08-15 ENCOUNTER — Other Ambulatory Visit (HOSPITAL_COMMUNITY): Payer: Self-pay

## 2023-08-15 DIAGNOSIS — Z720 Tobacco use: Secondary | ICD-10-CM

## 2023-08-15 DIAGNOSIS — I610 Nontraumatic intracerebral hemorrhage in hemisphere, subcortical: Secondary | ICD-10-CM | POA: Diagnosis not present

## 2023-08-15 DIAGNOSIS — I10 Essential (primary) hypertension: Secondary | ICD-10-CM | POA: Diagnosis not present

## 2023-08-15 LAB — CBC WITH DIFFERENTIAL/PLATELET
Abs Immature Granulocytes: 0 10*3/uL (ref 0.00–0.07)
Basophils Absolute: 0 10*3/uL (ref 0.0–0.1)
Basophils Relative: 1 %
Eosinophils Absolute: 0.1 10*3/uL (ref 0.0–0.5)
Eosinophils Relative: 3 %
HCT: 44.3 % (ref 39.0–52.0)
Hemoglobin: 15.5 g/dL (ref 13.0–17.0)
Immature Granulocytes: 0 %
Lymphocytes Relative: 43 %
Lymphs Abs: 1.2 10*3/uL (ref 0.7–4.0)
MCH: 34.5 pg — ABNORMAL HIGH (ref 26.0–34.0)
MCHC: 35 g/dL (ref 30.0–36.0)
MCV: 98.7 fL (ref 80.0–100.0)
Monocytes Absolute: 1 10*3/uL (ref 0.1–1.0)
Monocytes Relative: 35 %
Neutro Abs: 0.5 10*3/uL — ABNORMAL LOW (ref 1.7–7.7)
Neutrophils Relative %: 18 %
Platelets: 126 10*3/uL — ABNORMAL LOW (ref 150–400)
RBC: 4.49 MIL/uL (ref 4.22–5.81)
RDW: 12.6 % (ref 11.5–15.5)
WBC: 2.7 10*3/uL — ABNORMAL LOW (ref 4.0–10.5)
nRBC: 0 % (ref 0.0–0.2)

## 2023-08-15 LAB — COMPREHENSIVE METABOLIC PANEL
ALT: 18 U/L (ref 0–44)
AST: 18 U/L (ref 15–41)
Albumin: 3.4 g/dL — ABNORMAL LOW (ref 3.5–5.0)
Alkaline Phosphatase: 66 U/L (ref 38–126)
Anion gap: 8 (ref 5–15)
BUN: 18 mg/dL (ref 8–23)
CO2: 25 mmol/L (ref 22–32)
Calcium: 9.1 mg/dL (ref 8.9–10.3)
Chloride: 100 mmol/L (ref 98–111)
Creatinine, Ser: 0.88 mg/dL (ref 0.61–1.24)
GFR, Estimated: >60 mL/min (ref >60)
Glucose, Bld: 105 mg/dL — ABNORMAL HIGH (ref 70–99)
Potassium: 4.2 mmol/L (ref 3.5–5.1)
Sodium: 133 mmol/L — ABNORMAL LOW (ref 135–145)
Total Bilirubin: 0.9 mg/dL (ref 0.0–1.2)
Total Protein: 7.2 g/dL (ref 6.5–8.1)

## 2023-08-15 NOTE — Discharge Summary (Signed)
Physician Discharge Summary  Patient ID: Adrian [name] MRN: [id number] DOB/AGE: [birth year] 68 y.o.  Admit date: 08/14/2023 Discharge date: 09/05/2023  Discharge Diagnoses:  Principal Problem:   ICH (intracerebral hemorrhage) (HCC) Active problems: Functional deficits secondary ICH Atrial fibrillation Hypertension Hypothyroidism Chronic leukopenia Mild thrombocytopenia  Discharged Condition: stable  Significant Diagnostic Studies:  Labs:  Basic Metabolic Panel: Recent Labs  Lab 09/03/23 0552  NA 136  K 4.4  CL 99  CO2 28  GLUCOSE 92  BUN 19  CREATININE 1.02  CALCIUM 9.2    CBC: Recent Labs  Lab 09/03/23 0552  WBC 2.6*  HGB 14.4  HCT 39.5  MCV 95.0  PLT 152    CBG: No results for input(s): "GLUCAP" in the last 168 hours.   Brief HPI:   Adrian [name] is a 68 y.o. male who presented to the emergency department on 08/11/2023 reporting he woke up in his usual state of health and at approximately 11 AM noted abrupt onset of right-sided weakness. He was brought to the emergency department as code stroke. He was hypertensive to systolic blood pressure to 230 per EMS. CT scan of the head without contrast revealed left basal ganglia intracranial hemorrhage with intraventricular extension. CTA showed spots sign and blood pressure parameters were tightened to a goal of 110-130. He was admitted to the ICU for strict blood pressure control. His past medical history significant for atrial fibrillation on Eliquis, carotid artery stenosis, hypothyroidism, dispose loop recorder placed 06/19/2023 post A-fib ablation, chronic left ICA occlusion, tobacco use, hyperlipidemia, chronic leukopenia. Examination he exhibited right-sided facial droop, right upper and lower extremity weakness and dysarthria. 2D echo performed with left ventricular ejection fraction 60 to 65%. Hemoglobin A1c 5.6%. SCDs placed for VTE prophylaxis. The patient will follow-up with Dr. Pearlean [name] as an outpatient to consider  ASPIRE trial if interested. Alternatively to restart Eliquis in 2 weeks if stable or follow-up with cardiology for other options including Watchman device. He continues on amlodipine 2.5 mg daily and Lopressor 25 mg daily. Lipitor increased to 80 mg daily. Resume home Synthroid. To resume amiodarone 200 mg daily on admission to CIR. Now on heparin 5000 units 3 times daily for DVT prophylaxis. Advised regarding tobacco and alcohol use. Right hemiparesis persists. Tolerating heart healthy diet with thin liquids.    Hospital Course: Adrian [name] was admitted to rehab 08/14/2023 for inpatient therapies to consist of PT, ST and OT at least three hours five days a week. Past admission physiatrist, therapy team and rehab RN have worked together to provide customized collaborative inpatient rehab.  PRAFO for right heel cord while in bed.  Per acute team, amiodarone resumed on admission to CIR. Follow-up labs stable. MBS performed as patient was coughing with all consistencies. Continued on regular diet with thin liquids.  Follow-up labs dated 1/13 and 1/20 were stable. 2 weeks post ICH , Dr Roda Shutters , neuro rec repeat CT head - this was reviewed by Dr Roda Shutters as well as Pearlean [name].  Neuro recommends start Eliquis 1/20. Noted sensory deficit due to CVA. Left foot  sensation may be related to neuropathy, not a diabetic but has history of EtOH as well as chemotherapy about 20 years ago. Follow-up labs on 1/27: BMP normal and WBC count stable, platelet count normal. Neuropsychology consultation obtained on 1/28.   Blood pressures were monitored on TID basis and amlodipine 2.5 mg daily and metoprolol tartrate 50 mg twice daily continued. Morning dosing of BB had to be held due  to bradycardia. Decreased dosing to 25 mg BID on 1/10. Decreased BB to 12.5 mg BID on 1/13. Discontinued BB on 1/22.    Rehab course: During patient's stay in rehab weekly team conferences were held to monitor patient's progress, set goals and discuss barriers to  discharge. At admission, patient required mod with mobility and with basic self-care skills and min A for functional mobility   He has had improvement in activity tolerance, balance, postural control as well as ability to compensate for deficits. He has had improvement in functional use RUE  and RLE as well as improvement in awareness. Patient has met 14 of 14 long term goals due to improved balance, postural control, ability to compensate for deficits, functional use of  RIGHT upper and RIGHT lower extremity, and improved coordination.  Patient to discharge at overall Modified Independent level.  Pt has made significant improvements with his balance and functional use of RUE.   Discharge disposition: 01-Home or Self Care     Diet: hearth healthy  Special Instructions: No driving, alcohol consumption or tobacco use.  Follow-up with neurology to consider ASPIRE trial.  Discharge Instructions     Ambulatory referral to Neurology   Complete by: As directed    An appointment is requested in approximately: 2 weeks. Consider ASPIRE trial.   Ambulatory referral to Physical Medicine Rehab   Complete by: As directed    Hospital follow-up   Discharge patient   Complete by: As directed    Discharge disposition: 01-Home or Self Care   Discharge patient date: 09/05/2023      Allergies as of 09/05/2023   No Known Allergies      Medication List     STOP taking these medications    metoprolol tartrate 50 MG tablet Commonly known as: LOPRESSOR       TAKE these medications    acetaminophen 325 MG tablet Commonly known as: TYLENOL Take 1-2 tablets (325-650 mg total) by mouth every 4 (four) hours as needed for mild pain (pain score 1-3).   amiodarone 200 MG tablet Commonly known as: PACERONE Take 1 tablet (200 mg total) by mouth daily.   amLODipine 2.5 MG tablet Commonly known as: NORVASC Take 1 tablet (2.5 mg total) by mouth daily.   atorvastatin 80 MG tablet Commonly known  as: LIPITOR Take 1 tablet (80 mg total) by mouth daily.   Eliquis 5 MG Tabs tablet Generic drug: apixaban Take 5 mg by mouth 2 (two) times daily.   levothyroxine 125 MCG tablet Commonly known as: SYNTHROID TAKE 1 TABLET BY MOUTH EVERY DAY   Senna-S 8.6-50 MG tablet Generic drug: senna-docusate Take 1 tablet by mouth 2 (two) times daily.        Follow-up Information     Kirsteins, Victorino Sparrow, MD Follow up.   Specialty: Physical Medicine and Rehabilitation Why: office will call you to arrange your appt (sent) Contact information: [address] [place] [address] [phone]         Kristian Covey, MD Follow up.   Specialty: Family Medicine Why: Call the office in 1-2 days to make arrangements for hospital follow-up appointment. Contact information: [address] [place]ena Flake [place] [address] [phone]         [facility] Follow up.   Why: Call the office in 1-2 days to make arrangements for hospital follow-up appointment. Contact information: [address]     [address] [place] [address] [phone]  Signed: Milinda Antis, PA-C 09/05/2023, 5:53 PM

## 2023-08-15 NOTE — Evaluation (Signed)
 Physical Therapy Assessment and Plan  Patient Details  Name: Adrian [name] MRN: [id number] Date of Birth: 1956/07/30  PT Diagnosis: Cognitive deficits, Difficulty walking, Hemiparesis dominant, Hypertonia, Impaired cognition, Impaired sensation, Muscle spasms, and Muscle weakness Rehab Potential: Good ELOS: 12-16 days   Today's Date: 08/15/2023 PT Individual Time: 9067-8954 PT Individual Time Calculation (min): 73 min    Hospital Problem: Principal Problem:   ICH (intracerebral hemorrhage) (HCC)   Past Medical History:  Past Medical History:  Diagnosis Date   AF (paroxysmal atrial fibrillation) (HCC) 11/29/2018   Atrial flutter (HCC) 10/12/2015   a. TEE 3/17 with ? LAA clot-->s/p TEE/DCCV 11/24/2015; s/p DCCV 12/2022;s/p ablation 06/2023   CAD (coronary artery disease), native coronary artery 12/06/2015   a. 10/2015 MV: EF  37%, reversible defect inferior apex, intermediate risk findings; b. 10/2015 Cath: 20% mid RCA.   Carotid artery stenosis    1-39% right ICA stenosis and occluded left ICA   Colonic diverticular abscess    Diverticulitis    History of chemotherapy 2005   Cisplatin   Hypothyroidism    NICM (nonischemic cardiomyopathy) (HCC) 10/14/2015   a. Tachy mediated?;  b. Echo 3/17 - Mild concentric LVH, EF 30-35%, anteroseptal, anterior, anterolateral, apical anterior, lateral hypokinesis, trivial MR, mild to moderately reduced RVSF; c. LHC 3/17 - mRCA 20%   Radiation NOv.3,2005-Dec. 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  XRT   Past Surgical History:  Past Surgical History:  Procedure Laterality Date   A-FLUTTER ABLATION N/A 06/19/2023   Procedure: A-FLUTTER ABLATION;  Surgeon: Cindie Ole DASEN, MD;  Location: [facility] INVASIVE CV LAB;  Service: Cardiovascular;  Laterality: N/A;   APPENDECTOMY N/A 03/07/2019   Procedure: ROBOTIC ASSISTED APPENDECTOMY;  Surgeon: Sheldon Standing, MD;  Location: [facility] ORS;  Service: General;  Laterality: N/A;    CARDIAC CATHETERIZATION N/A 10/18/2015   Procedure: Left Heart Cath and Coronary Angiography;  Surgeon: Lonni JONETTA Cash, MD;  Location: [facility] INVASIVE CV LAB;  Service: Cardiovascular;  Laterality: N/A;   CARDIOVERSION N/A 11/24/2015   Procedure: CARDIOVERSION;  Surgeon: Maude JAYSON Emmer, MD;  Location: [facility] ENDOSCOPY;  Service: Cardiovascular;  Laterality: N/A;   CARDIOVERSION N/A 12/20/2022   Procedure: CARDIOVERSION;  Surgeon: Alvan Ronal BRAVO, MD;  Location: [facility] INVASIVE CV LAB;  Service: Cardiovascular;  Laterality: N/A;   CYSTOSCOPY WITH STENT PLACEMENT Bilateral 03/07/2019   Procedure: CYSTOSCOPY WITH BILATERAL FIREFLY INJECTION;  Surgeon: Cam Morene ORN, MD;  Location: [facility] ORS;  Service: Urology;  Laterality: Bilateral;   GASTROSTOMY TUBE PLACEMENT  07/04/2004   IR - G tube for tonsilar cancer   IR ANGIO INTRA EXTRACRAN SEL COM CAROTID INNOMINATE UNI R MOD SED  03/09/2019   IR ANGIO VERTEBRAL SEL VERTEBRAL UNI R MOD SED  03/09/2019   IR CT HEAD LTD  03/09/2019   IR PERCUTANEOUS ART THROMBECTOMY/INFUSION INTRACRANIAL INC DIAG ANGIO  03/09/2019   IR RADIOLOGIST EVAL & MGMT  12/18/2018   LAMINECTOMY     C5/placement of steel plate   LOOP RECORDER INSERTION N/A 06/19/2023   Procedure: LOOP RECORDER INSERTION;  Surgeon: Cindie Ole DASEN, MD;  Location: [facility] INVASIVE CV LAB;  Service: Cardiovascular;  Laterality: N/A;   NECK SURGERY  2003   replaced disk   RADIOLOGY WITH ANESTHESIA N/A 03/08/2019   Procedure: IR WITH ANESTHESIA;  Surgeon: Dolphus Carrion, MD;  Location: [facility] OR;  Service: Radiology;  Laterality: N/A;   TEE WITHOUT CARDIOVERSION N/A 10/15/2015  Procedure: TRANSESOPHAGEAL ECHOCARDIOGRAM (TEE);  Surgeon: Ezra GORMAN Shuck, MD;  Location: [facility] ENDOSCOPY;  Service: Cardiovascular;  Laterality: N/A;   TEE WITHOUT CARDIOVERSION N/A 11/24/2015   Procedure: TRANSESOPHAGEAL ECHOCARDIOGRAM (TEE);  Surgeon: Maude JAYSON Emmer, MD;  Location: [facility] ENDOSCOPY;  Service: Cardiovascular;  Laterality: N/A;    TEE WITHOUT CARDIOVERSION N/A 12/20/2022   Procedure: TRANSESOPHAGEAL ECHOCARDIOGRAM;  Surgeon: Alvan Ronal BRAVO, MD;  Location: [facility] INVASIVE CV LAB;  Service: Cardiovascular;  Laterality: N/A;   XI ROBOTIC ASSISTED COLOSTOMY TAKEDOWN N/A 03/07/2019   Procedure: XI ROBOTIC ASSISTED LOW ANTERIOR RESECTION, RIGID PROCTOSCOPY;  Surgeon: Sheldon Standing, MD;  Location: [facility] ORS;  Service: General;  Laterality: N/A;    Assessment & Plan Clinical Impression: Adrian [name] is a 68 year old male who presented to the emergency department on 08/11/2023 reporting he woke up in his usual state of health and at approximately 11 AM noted abrupt onset of right-sided weakness. He was brought to the emergency department as code stroke. He was hypertensive to systolic blood pressure to 230 per EMS. CT scan of the head without contrast revealed left basal ganglia intracranial hemorrhage with intraventricular extension. CTA showed spots sign and blood pressure parameters were tightened to a goal of 110-130. He was admitted to the ICU for strict blood pressure control. His past medical history significant for atrial fibrillation on Eliquis , carotid artery stenosis, hypothyroidism, dispose loop recorder placed 06/19/2023 post A-fib ablation, chronic left ICA occlusion, tobacco use, hyperlipidemia, chronic leukopenia. Examination he exhibited right-sided facial droop, right upper and lower extremity weakness and dysarthria. 2D echo performed with left ventricular ejection fraction 60 to 65%. Hemoglobin A1c 5.6%. SCDs placed for VTE prophylaxis. The patient will follow-up with Dr. Rosemarie as an outpatient to consider ASPIRE trial if interested. Alternatively to restart Eliquis  in 2 weeks if stable or follow-up with cardiology for other options including Watchman device. He continues on amlodipine  2.5 mg daily and Lopressor  25 mg daily. Lipitor  increased to 80 mg daily. Resume home Synthroid . To resume amiodarone  200 mg daily on admission to CIR.  Now on heparin  5000 units 3 times daily for DVT prophylaxis. Advised regarding tobacco and alcohol use. Right hemiparesis persists. Tolerating heart healthy diet with thin liquids. The patient requires inpatient medicine and rehabilitation evaluations and services for ongoing dysfunction secondary to ICH with IVH extension    Patient currently requires mod with mobility secondary to muscle weakness and muscle joint tightness, decreased cardiorespiratoy endurance, abnormal tone, unbalanced muscle activation, decreased coordination, and decreased motor planning, decreased problem solving, decreased safety awareness, and decreased memory, and decreased sitting balance, decreased standing balance, hemiplegia, and decreased balance strategies.  Prior to hospitalization, patient was independent  with mobility and lived with Alone in a House home.  Home access is 2Stairs to enter.  Patient will benefit from skilled PT intervention to maximize safe functional mobility, minimize fall risk, and decrease caregiver burden for planned discharge home with intermittent assist.  Anticipate patient will benefit from follow up [facility] at discharge.  PT - End of Session Activity Tolerance: Tolerates 30+ min activity with multiple rests Endurance Deficit: Yes PT Assessment Rehab Potential (ACUTE/IP ONLY): Good PT Barriers to Discharge: Inaccessible home environment;Decreased caregiver support;Home environment access/layout;Lack of/limited family support PT Barriers to Discharge Comments: pain PT Patient demonstrates impairments in the following area(s): Balance;Endurance;Motor;Pain;Perception;Safety;Sensory;Skin Integrity PT Transfers Functional Problem(s): Bed Mobility;Bed to Chair;Car;Furniture PT Locomotion Functional Problem(s): Ambulation;Wheelchair Mobility;Stairs PT Plan PT Intensity: Minimum of 1-2 x/day ,45 to 90 minutes PT Frequency: 5 out  of 7 days PT Duration Estimated Length of Stay: 12-16 days PT  Treatment/Interventions: Ambulation/gait training;Discharge planning;Functional mobility training;Psychosocial support;Therapeutic Activities;Visual/perceptual remediation/compensation;Balance/vestibular training;Disease management/prevention;Neuromuscular re-education;Skin care/wound management;Therapeutic Exercise;Wheelchair propulsion/positioning;Cognitive remediation/compensation;DME/adaptive equipment instruction;Pain management;Splinting/orthotics;UE/LE Strength taining/ROM;Community reintegration;Functional electrical stimulation;Patient/family education;Stair training;UE/LE Coordination activities PT Transfers Anticipated Outcome(s): mod I PT Locomotion Anticipated Outcome(s): supervision PT Recommendation Recommendations for Other Services: Speech consult Follow Up Recommendations: Home health PT Patient destination: Home Equipment Recommended: To be determined Equipment Details: pt does not have any equipment   PT Evaluation Precautions/Restrictions Precautions Precautions: Fall Precaution Comments: R hemi Restrictions Weight Bearing Restrictions Per Provider Order: No Pain Interference Pain Interference Pain Effect on Sleep: 2. Occasionally (R LE spasms-pt reports constant with inactivity) Pain Interference with Therapy Activities: 1. Rarely or not at all Pain Interference with Day-to-Day Activities: 1. Rarely or not at all Home Living/Prior Functioning Home Living Available Help at Discharge: Family;Friend(s) (pt reports pt wife, friends and daughter will be available to provide supervision level assistance) Type of Home: House Home Access: Stairs to enter [employer] of Steps: 2 Entrance Stairs-Rails: Right Home Layout: One level Bathroom Shower/Tub: Tub/shower unit (grab bars, handheld shower head) Bathroom Toilet: Standard Bathroom Accessibility: Yes  Lives With: Alone Prior Function Level of Independence: Independent with basic ADLs;Independent with  gait;Independent with transfers;Independent with homemaking with ambulation  Able to Take Stairs?: Yes Driving: Yes Vocation: Retired (retired 7 weeks ago, previously worked in [employer]) Vision/Perception  Vision - History Ability to See in Adequate Light: 0 Adequate Perception Perception: Impaired Preception Impairment Details: Inattention/Neglect;Spatial orientation Perception-Other Comments: R sided UE/LE neglect/inattention UE>LE Praxis Praxis: Impaired Praxis Impairment Details: Motor planning;Organization  Cognition Overall Cognitive Status: Impaired/Different from baseline Arousal/Alertness: Awake/alert Orientation Level: Oriented X4 Year: 2025 Month: January Day of Week: Correct Attention: Sustained Sustained Attention: Appears intact Sustained Attention Impairment: Verbal basic Memory: Impaired Memory Impairment: Storage deficit;Retrieval deficit Awareness: Appears intact Problem Solving: Impaired Problem Solving Impairment: Verbal complex;Functional complex Safety/Judgment: Impaired Sensation Sensation Light Touch: Appears Intact Proprioception: Impaired by gross assessment Additional Comments: ligth touch intact B LE, pt denies any N&T, pt reports sensation symmetrical B LE Coordination Gross Motor Movements are Fluid and Coordinated: No Fine Motor Movements are Fluid and Coordinated: No Coordination and Movement Description: limited 2/2 R hemi Motor  Motor Motor: Hemiplegia Motor - Skilled Clinical Observations: R hemi  Trunk/Postural Assessment     Balance Balance Balance Assessed: Yes Static Sitting Balance Static Sitting - Level of Assistance: 5: Stand by assistance Dynamic Sitting Balance Dynamic Sitting - Level of Assistance: 5: Stand by assistance Static Standing Balance Static Standing - Balance Support: No upper extremity supported;During functional activity Static Standing - Level of Assistance: 4: Min assist Dynamic Standing  Balance Dynamic Standing - Balance Support: During functional activity;No upper extremity supported;Bilateral upper extremity supported Dynamic Standing - Level of Assistance: 4: Min assist Extremity Assessment  RLE Assessment RLE Assessment: Exceptions to [facility] RLE Strength Right Hip Flexion: 3-/5 Right Hip ABduction: 2/5 Right Knee Flexion: 3-/5 Right Knee Extension: 3+/5 Right Ankle Dorsiflexion: 1/5 Right Ankle Plantar Flexion: 2/5 RLE Tone RLE Tone: Hypertonic LLE Assessment LLE Assessment: Within Functional Limits  Care Tool Care Tool Bed Mobility Roll left and right activity   Roll left and right assist level: Supervision/Verbal cueing    Sit to lying activity   Sit to lying assist level: Supervision/Verbal cueing    Lying to sitting on side of bed activity   Lying to sitting on side of bed assist level: the ability to move  from lying on the back to sitting on the side of the bed with no back support.: Supervision/Verbal cueing     Care Tool Transfers Sit to stand transfer   Sit to stand assist level: Minimal Assistance - Patient > 75%    Chair/bed transfer   Chair/bed transfer assist level: Minimal Assistance - Patient > 75%    Car transfer   Car transfer assist level: Minimal Assistance - Patient > 75%      Care Tool Locomotion Ambulation   Assist level: 2 helpers Assistive device:  (B HHA) Max distance: 8 feet  Walk 10 feet activity   Assist level: 2 helpers     Walk 50 feet with 2 turns activity Walk 50 feet with 2 turns activity did not occur: Safety/medical concerns (fatigue)      Walk 150 feet activity Walk 150 feet activity did not occur: Safety/medical concerns (fatigue)      Walk 10 feet on uneven surfaces activity Walk 10 feet on uneven surfaces activity did not occur: Safety/medical concerns      Stairs Stair activity did not occur: Safety/medical concerns Assist level: 2 helpers Stairs assistive device: 2 hand rails Max number of stairs:  8  Walk up/down 1 step activity   Walk up/down 1 step (curb) assist level: 2 helpers Walk up/down 1 step or curb assistive device: 2 hand rails  Walk up/down 4 steps activity   Walk up/down 4 steps assist level: 2 helpers Walk up/down 4 steps assistive device: 2 hand rails  Walk up/down 12 steps activity Walk up/down 12 steps activity did not occur: Safety/medical concerns (fatigue)      Pick up small objects from floor   Pick up small object from the floor assist level: Total Assistance - Patient < 25%    Wheelchair Is the patient using a wheelchair?: Yes Type of Wheelchair: Manual   Wheelchair assist level: Dependent - Patient 0%    Wheel 50 feet with 2 turns activity   Assist Level: Dependent - Patient 0%  Wheel 150 feet activity   Assist Level: Dependent - Patient 0%    Refer to Care Plan for Long Term Goals  SHORT TERM GOAL WEEK 1 PT Short Term Goal 1 (Week 1): pt will perform sit to stand with LRAD and supervision PT Short Term Goal 2 (Week 1): pt will perform bed to chair transfer with LRAD and supervision PT Short Term Goal 3 (Week 1): pt will ambulate 50 feet with LRAD and min A  Recommendations for other services: None   Skilled Therapeutic Intervention Mobility Bed Mobility Bed Mobility: Rolling Right;Rolling Left;Supine to Sit;Sit to Supine Rolling Right: Supervision/verbal cueing Rolling Left: Supervision/Verbal cueing Supine to Sit: Supervision/Verbal cueing Sit to Supine: Supervision/Verbal cueing Transfers Transfers: Sit to Stand;Stand to Sit;Stand Pivot Transfers Sit to Stand: Minimal Assistance - Patient > 75% Stand to Sit: Minimal Assistance - Patient > 75% Stand Pivot Transfers: Minimal Assistance - Patient > 75% Stand Pivot Transfer Details: Verbal cues for precautions/safety;Verbal cues for sequencing;Verbal cues for technique Transfer (Assistive device): None Locomotion  Gait Ambulation: Yes Gait Assistance: Moderate Assistance - Patient  50-74% Gait Distance (Feet): 8 Feet Assistive device: 2 person hand held assist Gait Assistance Details: Verbal cues for gait pattern;Verbal cues for precautions/safety;Verbal cues for technique;Verbal cues for sequencing Gait Gait: Yes Gait Pattern: Impaired Gait Pattern: Step-to pattern;Decreased step length - left;Decreased stance time - right;Decreased dorsiflexion - right;Decreased hip/knee flexion - right;Decreased weight shift to right;Right circumduction;Right foot flat;Right genu  recurvatum;Narrow base of support;Poor foot clearance - right;Lateral trunk lean to right Gait velocity: decreased Stairs / Additional Locomotion Stairs: Yes Stairs Assistance: 2 Helpers;Moderate Assistance - Patient 50 - 74% Stair Management Technique: Two rails Number of Stairs: 8 Height of Stairs: 3 Wheelchair Mobility Wheelchair Mobility: Yes Wheelchair Assistance: Dependent - Patient 0% Wheelchair Parts Management: Needs assistance   Discharge Criteria: Patient will be discharged from PT if patient refuses treatment 3 consecutive times without medical reason, if treatment goals not met, if there is a change in medical status, if patient makes no progress towards goals or if patient is discharged from hospital.  The above assessment, treatment plan, treatment alternatives and goals were discussed and mutually agreed upon: by patient  Today's Interventions  Pt supine in bed upon arrival. Pt agreeable to therapy. Pt denies any pain.   Evaluation completed (see details above and below) with education on PT POC and goals and individual treatment initiated with focus on gait training.   Bed mobility with supervision, verbal cues provided for attention to R UE.  Sit to stand, stand pivot transfer min A with no AD, verbal and tactile cues provided for R LE positioning Gait x 8 feet with B HHA (mod A) with step to gait, narrow BOS, inconsistent step length, R knee hyperextension in stance, narrow BOS.  No evidence of bucling-verbal cues provided for R LE positioning as pt will attempt to step with ankle inverted without cues.   Wrapped R LE in ace wrap, pt ambulated 25 feet with L HHA on rail, and +2 for WC follow, pt demos improved reciprocal gait, verbal cues provided for decreased step length R LE and slight bend in R knee.   Pt ascended/descended 8 3 inch steps with B HR and step to gait, and +2 mod A, for R lateral trunk lean and intermittent R LE instability/buckling, with therapist facilitating hip and knee flexion for R LE advancement.   Education provided on purpose of R LE PRAFO, for assisting with R LE positioning and contracture prevention. Education provided to wear it on R LE at night.   Pt supine in bed with all needs within reach and bed alarm on.    [facility] [place], [place], DPT  08/15/2023, 1:22 PM

## 2023-08-15 NOTE — Discharge Instructions (Addendum)
 Inpatient Rehab Discharge Instructions  Adrian [name] Discharge date and time:  09/05/2023  Activities/Precautions/ Functional Status: Activity: no lifting, driving, or strenuous exercise until cleared by MD Diet: cardiac diet Wound Care: none needed Functional status:  ___ No restrictions     ___ Walk up steps independently _x__ 24/7 supervision/assistance   ___ Walk up steps with assistance ___ Intermittent supervision/assistance  ___ Bathe/dress independently ___ Walk with walker     ___ Bathe/dress with assistance ___ Walk Independently    ___ Shower independently ___ Walk with assistance    _x__ Shower with assistance __x_ No alcohol     ___ Return to work/school ________  Special Instructions: No driving, alcohol consumption or tobacco use.  Recommend daily BP measurement in same arm and record time of day. Bring this information with you to follow-up appointment with PCP.  COMMUNITY REFERRALS UPON DISCHARGE:    Home Health:   PT     OT     ST                Agency: Phone:  *To be determined at this time. Will call once able to secure home health, and then will cancel outpatient referral.*   Outpatient: PT     OT    ST             Agency:[facility]  Phone:[phone]              Appointment Date/Time:*Please expect follow-up within 7-10 business days to schedule your appointment. If you have not received follow-up, be sure to contact the site directly.*   Medical Equipment/Items Ordered:rolling walker and tub transfer bench                                                 Agency/Supplier:[employer] [phone]   STROKE/TIA DISCHARGE INSTRUCTIONS SMOKING Cigarette smoking nearly doubles your risk of having a stroke & is the single most alterable risk factor  If you smoke or have smoked in the last 12 months, you are advised to quit smoking for your health. Most of the excess cardiovascular risk related to smoking disappears within a year  of stopping. Ask you doctor about anti-smoking medications  Quit Line: 1-800-QUIT NOW Free Smoking Cessation Classes [phone]  CHOLESTEROL Know your levels; limit fat & cholesterol in your diet  Lipid Panel     Component Value Date/Time   CHOL 199 08/12/2023 0428   CHOL 140 02/02/2023 1055   TRIG 128 08/12/2023 0428   HDL 56 08/12/2023 0428   HDL 58 02/02/2023 1055   CHOLHDL 3.6 08/12/2023 0428   VLDL 26 08/12/2023 0428   LDLCALC 117 (H) 08/12/2023 0428   LDLCALC 68 02/02/2023 1055     Many patients benefit from treatment even if their cholesterol is at goal. Goal: Total Cholesterol (CHOL) less than 160 Goal:  Triglycerides (TRIG) less than 150 Goal:  HDL greater than 40 Goal:  LDL (LDLCALC) less than 100   BLOOD PRESSURE American Stroke Association blood pressure target is less that 120/80 mm/Hg  Your discharge blood pressure is:  BP: (!) 117/56 Monitor your blood pressure Limit your salt and alcohol intake Many individuals will require more than one medication for high blood pressure  DIABETES (A1c is a blood sugar average for last 3 months) Goal HGBA1c is under 7% (  HBGA1c is blood sugar average for last 3 months)  Diabetes: No known diagnosis of diabetes    Lab Results  Component Value Date   HGBA1C 5.6 08/12/2023    Your HGBA1c can be lowered with medications, healthy diet, and exercise. Check your blood sugar as directed by your physician Call your physician if you experience unexplained or low blood sugars.  PHYSICAL ACTIVITY/REHABILITATION Goal is 30 minutes at least 4 days per week  Activity: Increase activity slowly, Therapies: Physical Therapy: Outpatient, Occupational Therapy: Outpatient, and Speech Therapy: Outpatient Return to work: when cleared by MD Activity decreases your risk of heart attack and stroke and makes your heart stronger.  It helps control your weight and blood pressure; helps you relax and can improve your mood. Participate in a regular  exercise program. Talk with your doctor about the best form of exercise for you (dancing, walking, swimming, cycling).  DIET/WEIGHT Goal is to maintain a healthy weight  Your discharge diet is:  Diet Order             Diet Heart Room service appropriate? Yes; Fluid consistency: Thin  Diet effective now                  thin liquids Your height is:  Height: 5' 8 (172.7 cm) Your current weight is: Weight: 59.4 kg Your Body Mass Index (BMI) is:  BMI (Calculated): 19.92 Following the type of diet specifically designed for you will help prevent another stroke. Your goal weight range is:   Your goal Body Mass Index (BMI) is 19-24. Healthy food habits can help reduce 3 risk factors for stroke:  High cholesterol, hypertension, and excess weight.  RESOURCES Stroke/Support Group:  Call [phone]   STROKE EDUCATION PROVIDED/REVIEWED AND GIVEN TO PATIENT Stroke warning signs and symptoms How to activate emergency medical system (call 911). Medications prescribed at discharge. Need for follow-up after discharge. Personal risk factors for stroke. Pneumonia vaccine given: No Flu vaccine given: No My questions have been answered, the writing is legible, and I understand these instructions.  I will adhere to these goals & educational materials that have been provided to me after my discharge from the hospital.     My questions have been answered and I understand these instructions. I will adhere to these goals and the provided educational materials after my discharge from the hospital.  Patient/Caregiver Signature _______________________________ Date __________  Clinician Signature _______________________________________ Date __________  Please bring this form and your medication list with you to all your follow-up doctor's appointments.

## 2023-08-15 NOTE — Plan of Care (Signed)
 Problem: RH Balance Goal: LTG: Patient will maintain dynamic sitting balance (OT) Description: LTG:  Patient will maintain dynamic sitting balance with assistance during activities of daily living (OT) Flowsheets (Taken 08/15/2023 1504) LTG: Pt will maintain dynamic sitting balance during ADLs with: Independent with assistive device Goal: LTG Patient will maintain dynamic standing with ADLs (OT) Description: LTG:  Patient will maintain dynamic standing balance with assist during activities of daily living (OT)  Flowsheets (Taken 08/15/2023 1504) LTG: Pt will maintain dynamic standing balance during ADLs with: Independent with assistive device   Problem: Sit to Stand Goal: LTG:  Patient will perform sit to stand in prep for activites of daily living with assistance level (OT) Description: LTG:  Patient will perform sit to stand in prep for activites of daily living with assistance level (OT) Flowsheets (Taken 08/15/2023 1504) LTG: PT will perform sit to stand in prep for activites of daily living with assistance level: Independent with assistive device   Problem: RH Eating Goal: LTG Patient will perform eating w/assist, cues/equip (OT) Description: LTG: Patient will perform eating with assist, with/without cues using equipment (OT) Flowsheets (Taken 08/15/2023 1504) LTG: Pt will perform eating with assistance level of: (using dominant hand (right) with LRAD/ finger foods) Supervision/Verbal cueing Note: using dominant hand (right) with LRAD/ finger foods    Problem: RH Grooming Goal: LTG Patient will perform grooming w/assist,cues/equip (OT) Description: LTG: Patient will perform grooming with assist, with/without cues using equipment (OT) Flowsheets (Taken 08/15/2023 1504) LTG: Pt will perform grooming with assistance level of: Supervision/Verbal cueing   Problem: RH Bathing Goal: LTG Patient will bathe all body parts with assist levels (OT) Description: LTG: Patient will bathe all body parts  with assist levels (OT) Flowsheets (Taken 08/15/2023 1504) LTG: Pt will perform bathing with assistance level/cueing: Supervision/Verbal cueing   Problem: RH Dressing Goal: LTG Patient will perform upper body dressing (OT) Description: LTG Patient will perform upper body dressing with assist, with/without cues (OT). Flowsheets (Taken 08/15/2023 1504) LTG: Pt will perform upper body dressing with assistance level of: Independent with assistive device   Problem: RH Toileting Goal: LTG Patient will perform toileting task (3/3 steps) with assistance level (OT) Description: LTG: Patient will perform toileting task (3/3 steps) with assistance level (OT)  Flowsheets (Taken 08/15/2023 1504) LTG: Pt will perform toileting task (3/3 steps) with assistance level: Independent with assistive device   Problem: RH Functional Use of Upper Extremity Goal: LTG Patient will use RT/LT upper extremity as a (OT) Description: LTG: Patient will use right/left upper extremity as a stabilizer/gross assist/diminished/nondominant/dominant level with assist, with/without cues during functional activity (OT) Flowsheets (Taken 08/15/2023 1504) LTG: Use of upper extremity in functional activities: RUE as nondominant level LTG: Pt will use upper extremity in functional activity with assistance level of: Supervision/Verbal cueing   Problem: RH Toilet Transfers Goal: LTG Patient will perform toilet transfers w/assist (OT) Description: LTG: Patient will perform toilet transfers with assist, with/without cues using equipment (OT) Flowsheets (Taken 08/15/2023 1504) LTG: Pt will perform toilet transfers with assistance level of: Independent with assistive device   Problem: RH Tub/Shower Transfers Goal: LTG Patient will perform tub/shower transfers w/assist (OT) Description: LTG: Patient will perform tub/shower transfers with assist, with/without cues using equipment (OT) Flowsheets (Taken 08/15/2023 1504) LTG: Pt will perform  tub/shower stall transfers with assistance level of: Supervision/Verbal cueing   Problem: RH Dressing Goal: LTG Patient will perform lower body dressing w/assist (OT) Description: LTG: Patient will perform lower body dressing with assist, with/without cues in  positioning using equipment (OT) Flowsheets (Taken 08/15/2023 1506) LTG: Pt will perform lower body dressing with assistance level of: Supervision/Verbal cueing

## 2023-08-15 NOTE — Evaluation (Signed)
 Speech Language Pathology Assessment and Plan  Patient Details  Name: Adrian [name] MRN: [id number] Date of Birth: [birth year]  SLP Diagnosis: Aphasia;Cognitive Impairments;Dysphagia  Rehab Potential: Excellent ELOS: 2 weeks    Today's Date: 08/15/2023 SLP Individual Time: 0800-0900 SLP Individual Time Calculation (min): 60 min   Hospital Problem: Principal Problem:   ICH (intracerebral hemorrhage) (HCC)  Past Medical History:  Past Medical History:  Diagnosis Date   AF (paroxysmal atrial fibrillation) (HCC) 11/29/2018   Atrial flutter (HCC) 10/12/2015   a. TEE 3/17 with ? LAA clot-->s/p TEE/DCCV 11/24/2015; s/p DCCV 12/2022;s/p ablation 06/2023   CAD (coronary artery disease), native coronary artery 12/06/2015   a. 10/2015 MV: EF  37%, reversible defect inferior apex, intermediate risk findings; b. 10/2015 Cath: 20% mid RCA.   Carotid artery stenosis    1-39% right ICA stenosis and occluded left ICA   Colonic diverticular abscess    Diverticulitis    History of chemotherapy 2005   Cisplatin   Hypothyroidism    NICM (nonischemic cardiomyopathy) (HCC) 10/14/2015   a. Tachy mediated?;  b. Echo 3/17 - Mild concentric LVH, EF 30-35%, anteroseptal, anterior, anterolateral, apical anterior, lateral hypokinesis, trivial MR, mild to moderately reduced RVSF; c. LHC 3/17 - mRCA 20%   Radiation NOv.3,2005-Dec. 15, 2005   6810 cGy in 30 fractions   Tonsil cancer [facility]) 2005   Dr Hughes.  XRT   Past Surgical History:  Past Surgical History:  Procedure Laterality Date   A-FLUTTER ABLATION N/A 06/19/2023   Procedure: A-FLUTTER ABLATION;  Surgeon: Cindie Ole DASEN, MD;  Location[facility] INVASIVE CV LAB;  Service: Cardiovascular;  Laterality: N/A;   APPENDECTOMY N/A 03/07/2019   Procedure: ROBOTIC ASSISTED APPENDECTOMY;  Surgeon: Sheldon Standing, MD;  Location: [facility] ORS;  Service: General;  Laterality: N/A;   CARDIAC CATHETERIZATION N/A 10/18/2015   Procedure: Left Heart Cath and Coronary  Angiography;  Surgeon: Lonni JONETTA Cash, MD;  Location: [facility] INVASIVE CV LAB;  Service: Cardiovascular;  Laterality: N/A;   CARDIOVERSION N/A 11/24/2015   Procedure: CARDIOVERSION;  Surgeon: Maude JAYSON Emmer, MD;  Location: [facility] ENDOSCOPY;  Service: Cardiovascular;  Laterality: N/A;   CARDIOVERSION N/A 12/20/2022   Procedure: CARDIOVERSION;  Surgeon: Alvan Ronal BRAVO, MD;  Location: [facility] INVASIVE CV LAB;  Service: Cardiovascular;  Laterality: N/A;   CYSTOSCOPY WITH STENT PLACEMENT Bilateral 03/07/2019   Procedure: CYSTOSCOPY WITH BILATERAL FIREFLY INJECTION;  Surgeon: Cam Morene ORN, MD;  Location: [facility] ORS;  Service: Urology;  Laterality: Bilateral;   GASTROSTOMY TUBE PLACEMENT  07/04/2004   IR - G tube for tonsilar cancer   IR ANGIO INTRA EXTRACRAN SEL COM CAROTID INNOMINATE UNI R MOD SED  03/09/2019   IR ANGIO VERTEBRAL SEL VERTEBRAL UNI R MOD SED  03/09/2019   IR CT HEAD LTD  03/09/2019   IR PERCUTANEOUS ART THROMBECTOMY/INFUSION INTRACRANIAL INC DIAG ANGIO  03/09/2019   IR RADIOLOGIST EVAL & MGMT  12/18/2018   LAMINECTOMY     C5/placement of steel plate   LOOP RECORDER INSERTION N/A 06/19/2023   Procedure: LOOP RECORDER INSERTION;  Surgeon: Cindie Ole DASEN, MD;  Location: [facility] INVASIVE CV LAB;  Service: Cardiovascular;  Laterality: N/A;   NECK SURGERY  2003   replaced disk   RADIOLOGY WITH ANESTHESIA N/A 03/08/2019   Procedure: IR WITH ANESTHESIA;  Surgeon: Dolphus Carrion, MD;  Location: [facility] OR;  Service: Radiology;  Laterality: N/A;   TEE WITHOUT CARDIOVERSION N/A 10/15/2015   Procedure: TRANSESOPHAGEAL ECHOCARDIOGRAM (TEE);  Surgeon: Ezra GORMAN Shuck, MD;  Location:  [facility] ENDOSCOPY;  Service: Cardiovascular;  Laterality: N/A;   TEE WITHOUT CARDIOVERSION N/A 11/24/2015   Procedure: TRANSESOPHAGEAL ECHOCARDIOGRAM (TEE);  Surgeon: Maude JAYSON Emmer, MD;  Location[facility] ENDOSCOPY;  Service: Cardiovascular;  Laterality: N/A;   TEE WITHOUT CARDIOVERSION N/A 12/20/2022   Procedure: TRANSESOPHAGEAL  ECHOCARDIOGRAM;  Surgeon: Alvan Ronal BRAVO, MD;  Location: [facility] INVASIVE CV LAB;  Service: Cardiovascular;  Laterality: N/A;   XI ROBOTIC ASSISTED COLOSTOMY TAKEDOWN N/A 03/07/2019   Procedure: XI ROBOTIC ASSISTED LOW ANTERIOR RESECTION, RIGID PROCTOSCOPY;  Surgeon: Sheldon Standing, MD;  Location: [facility] ORS;  Service: General;  Laterality: N/A;    Assessment / Plan / Recommendation Clinical Impression HPI: Adrian [name] is a 68 year old male who presented to the emergency department on 08/11/2023 reporting he woke up in his usual state of health and at approximately 11 AM noted abrupt onset of right-sided weakness. He was brought to the emergency department as code stroke. He was hypertensive to systolic blood pressure to 230 per EMS. CT scan of the head without contrast revealed left basal ganglia intracranial hemorrhage with intraventricular extension. CTA showed spots sign and blood pressure parameters were tightened to a goal of 110-130. He was admitted to the ICU for strict blood pressure control. His past medical history significant for atrial fibrillation on Eliquis , carotid artery stenosis, hypothyroidism, dispose loop recorder placed 06/19/2023 post A-fib ablation, chronic left ICA occlusion, tobacco use, hyperlipidemia, chronic leukopenia. Examination he exhibited right-sided facial droop, right upper and lower extremity weakness and dysarthria. 2D echo performed with left ventricular ejection fraction 60 to 65%. Hemoglobin A1c 5.6%. SCDs placed for VTE prophylaxis. The patient will follow-up with Dr. Rosemarie as an outpatient to consider ASPIRE trial if interested. Alternatively to restart Eliquis  in 2 weeks if stable or follow-up with cardiology for other options including Watchman device. He continues on amlodipine  2.5 mg daily and Lopressor  25 mg daily. Lipitor  increased to 80 mg daily. Resume home Synthroid . To resume amiodarone  200 mg daily on admission to CIR. Now on heparin  5000 units 3 times daily for DVT  prophylaxis. Advised regarding tobacco and alcohol use. Right hemiparesis persists. Tolerating heart healthy diet with thin liquids. The patient requires inpatient medicine and rehabilitation evaluations and services for ongoing dysfunction secondary to ICH with IVH extension.   Clinical Impression:  Bedside Swallow Evaluation: A bedside swallow evaluation was completed to assess for s/sx of oropharyngeal dysphagia. Oral mechanism exam WFL. Patient was provided POs of thin liquids, puree and solids. Patient with immediate and/or delayed throat clears after all consistencies tested which could be due to bolus misdirection. Patient with adequate mastication times and oral clearance. Recommend continuation of regular/thin diet at this time with plan for MBS tomorrow (1/9) to further assess oropharyngeal swallow function.  Communication: Patient presents with mild expressive language deficits characterized by occasional anomia and phonemic paraphasias during conversation.  Cognition: The Cognistat was utilized to assess cognitive-linguistic functioning. Patient scored WFL on all subtests with the exception of severe deficits in memory and mild deficits in calculations. Patient sustained attention throughout evaluation and demonstrated intellectual/emergent awareness through identification of cognitive/physical deficits and correction of errors. Recommend targeting memory and problem solving during inpatient stay due to patients prior high cognitive load.  Dysarthria: Patient presents with mild dysarthria characterized by imprecise articulation resulting in ~85% intelligibility at the conversational level.  Pt would benefit from skilled ST services to maximize dysphagia, dysarthria, word finding and cognition in order to maximize functional independence at d/c. Anticipate patient will require  24 hour supervision at d/c and f/u SLP services.    Skilled Therapeutic Interventions          Patient evaluated using a  non-standardized cognitive linguistic assessment and bedside swallow evaluation to assess current cognitive, communicative and swallowing function. See above for details.    SLP Assessment  Patient will need skilled Speech Lanaguage Pathology Services during CIR admission    Recommendations  SLP Diet Recommendations: Age appropriate regular solids;Thin Liquid Administration via: Cup Medication Administration: Whole meds with liquid Supervision: Patient able to self feed Compensations: Minimize environmental distractions;Slow rate;Small sips/bites Postural Changes and/or Swallow Maneuvers: Seated upright 90 degrees Oral Care Recommendations: Oral care BID Patient destination: Home Follow up Recommendations: Outpatient SLP Equipment Recommended: None recommended by SLP    SLP Frequency 3 to 5 out of 7 days   SLP Duration  SLP Intensity  SLP Treatment/Interventions 2 weeks  Minumum of 1-2 x/day, 30 to 90 minutes  Cognitive remediation/compensation;Dysphagia/aspiration precaution training;Cueing hierarchy;Functional tasks;Internal/external aids;Multimodal communication approach;Patient/family education;Speech/Language facilitation;Therapeutic Activities;Therapeutic Exercise    Pain None reported   SLP Evaluation Cognition Overall Cognitive Status: Impaired/Different from baseline Arousal/Alertness: Awake/alert Orientation Level: Oriented X4 Year: 2025 Month: January Day of Week: Correct Attention: Sustained Sustained Attention: Appears intact Memory: Impaired Memory Impairment: Storage deficit;Retrieval deficit Awareness: Appears intact Problem Solving: Impaired Problem Solving Impairment: Verbal complex;Functional complex  Comprehension Auditory Comprehension Overall Auditory Comprehension: Appears within functional limits for tasks assessed Expression Expression Primary Mode of Expression: Verbal Verbal Expression Overall Verbal Expression: Impaired Initiation: No  impairment Repetition: No impairment Naming: Impairment Other Naming Comments: anomia during conversation Verbal Errors: Phonemic paraphasias;Aware of errors Pragmatics: No impairment Oral Motor Oral Motor/Sensory Function Overall Oral Motor/Sensory Function: Within functional limits Motor Speech Overall Motor Speech: Impaired Respiration: Within functional limits Phonation: Normal Resonance: Within functional limits Articulation: Impaired Level of Impairment: Conversation Intelligibility: Intelligibility reduced Conversation: 75-100% accurate  Care Tool Care Tool Cognition Ability to hear (with hearing aid or hearing appliances if normally used Ability to hear (with hearing aid or hearing appliances if normally used): 0. Adequate - no difficulty in normal conservation, social interaction, listening to TV   Expression of Ideas and Wants Expression of Ideas and Wants: 3. Some difficulty - exhibits some difficulty with expressing needs and ideas (e.g, some words or finishing thoughts) or speech is not clear   Understanding Verbal and Non-Verbal Content Understanding Verbal and Non-Verbal Content: 4. Understands (complex and basic) - clear comprehension without cues or repetitions  Memory/Recall Ability Memory/Recall Ability : Current season;That he or she is in a hospital/hospital unit   Bedside Swallowing Assessment General Previous Swallow Assessment: none Diet Prior to this Study: Regular;Thin liquids (Level 0) Respiratory Status: Room air Behavior/Cognition: Alert;Cooperative;Pleasant mood Oral Cavity - Dentition: Adequate natural dentition Self-Feeding Abilities: Needs assist Patient Positioning: Upright in bed Baseline Vocal Quality: Normal Volitional Cough: Strong Volitional Swallow: Able to elicit  Ice Chips Ice chips: Not tested Thin Liquid Thin Liquid: Impaired Presentation: Cup;Straw Pharyngeal  Phase Impairments: Throat Clearing - Immediate;Throat Clearing -  Delayed Nectar Thick Nectar Thick Liquid: Not tested Honey Thick Honey Thick Liquid: Not tested Puree Puree: Impaired Presentation: Self Fed;Spoon Pharyngeal Phase Impairments: Throat Clearing - Delayed Solid Solid: Impaired Pharyngeal Phase Impairments: Throat Clearing - Immediate;Throat Clearing - Delayed BSE Assessment Risk for Aspiration Impact on safety and function: Mild aspiration risk Other Related Risk Factors: Previous CVA  Short Term Goals: Week 1: SLP Short Term Goal 1 (Week 1): Patient will participate in MBS to assess  oropharyngeal swallow function SLP Short Term Goal 2 (Week 1): Patient will increase speech intelligibility to 90% during conversation with use of strategies given min multimodal A SLP Short Term Goal 3 (Week 1): Patient will utilize compensatory strategies to increase word finding during abstract thoughts given min multimodal A SLP Short Term Goal 4 (Week 1): Patient will demonstrate problem solving during mildly complex functional situations given min multimodal A SLP Short Term Goal 5 (Week 1): Patient will recall and utilize memory compensatory aids given min multimodal A  Refer to Care Plan for Long Term Goals  Recommendations for other services: None   Discharge Criteria: Patient will be discharged from SLP if patient refuses treatment 3 consecutive times without medical reason, if treatment goals not met, if there is a change in medical status, if patient makes no progress towards goals or if patient is discharged from hospital.  The above assessment, treatment plan, treatment alternatives and goals were discussed and mutually agreed upon: by patient  Loistine Eberlin M.A., CF-SLP 08/15/2023, 9:50 AM

## 2023-08-15 NOTE — Progress Notes (Signed)
 Patient ID: Adrian [name], male   DOB: [birth year], 68 y.o.   MRN: [id number]  1220-SW had in-depth conversation with pt ex-wife/separated. Reports pt can come to her home at discharge. Will see about his friends assisting with his care as she does work with the exception of one day in which she works remote. She will see about their dtr coming up form [place] to help if needed (she can work remote). Shares pt is a very private person and has not disclosed he has a alcool dependence in which there have been severe consequences (loss of job, marriage, injury to self from falls, etc). She indicates he will likely not share his excessive use. She is aware SW will continue to provide updates as available.   Graeme Jude, MSW, LCSW Office: [phone] Cell: [phone] Fax: [phone]

## 2023-08-15 NOTE — Patient Care Conference (Signed)
 Inpatient RehabilitationTeam Conference and Plan of Care Update Date: 08/15/2023   Time: 10:19 AM    Patient Name: Adrian [name]      Medical Record Number: [id number]  Date of Birth: Mar 07, 1956 Sex: Male         Room/Bed: 4W25C/4W25C-01 Payor Info: Payor: BLUE CROSS BLUE SHIELD / Plan: BCBS COMM PPO / Product Type: *No Product type* /    Admit Date/Time:  08/14/2023  4:52 PM  Primary Diagnosis:  ICH (intracerebral hemorrhage) [facility])  Hospital Problems: Principal Problem:   ICH (intracerebral hemorrhage) (HCC)    Expected Discharge Date: Expected Discharge Date:  (Evals pending)  Team Members Present: Physician leading conference: Dr. Prentice Compton Social Worker Present: Graeme Jude, LCSWA Nurse Present: Barnie Ronde, RN;Shalamar Crays Hilliard, RN PT Present: Recardo Milliner, PT OT Present: Delon Sharps, OT SLP Present: Blaise Alderman, SLP PPS Coordinator present : Eleanor Colon, SLP     Current Status/Progress Goal Weekly Team Focus  Bowel/Bladder   Continent of B/B. LBm 08/13/23   Maintain continence   Monitor for changes    Swallow/Nutrition/ Hydration   eval pending           ADL's   evals pending            Mobility   eval pending           Communication   eval pending            Safety/Cognition/ Behavioral Observations  eval pending            Pain   Denies pain   Remain pain free   Assess QS and prn    Skin   Intact   Maintain integrity  Assess QS and prn.      Discharge Planning:  TBA. Per EMR, pt to discharge to home with ex-wife. SW will confirm there are no barriers to discharge.   Team Discussion: ICH. Strength in upper extremities better than lower extremities. Good fine motor. Sensation good. Blood pressures mildly elevated. Was on Eliquis  PTA for A-fib but stopped d/t bleed. Constipation addressed. Smoking cessations provided. PT/OT evaluations pending. ST noted coughing during meals. Currently on heart healthy diet/thin  liquids. Noted mild word finding and memory difficulties.  Patient on target to meet rehab goals: Evaluations pending  *See Care Plan and progress notes for long and short-term goals.   Revisions to Treatment Plan:  Will repeat scan in 2 weeks and if stable will restart Eliquis . Swallow study tomorrow 08/16/23 Teaching Needs: Medications, safety, self care, gait/transfer training, diet and lifestyle modifications, etc.  Current Barriers to Discharge: Decreased caregiver support  Possible Resolutions to Barriers: Family education Adhere to suggested smoking cessation and diet/lifestyle modifications Order recommended DME      Medical Summary Current Status: uncontrolled hypertension, righ themiparesis stroke, AFib  Barriers to Discharge: Medical stability   Possible Resolutions to [employer] Focus: med managment of HTN   Continued Need for Acute Rehabilitation Level of Care: The patient requires daily medical management by a physician with specialized training in physical medicine and rehabilitation for the following reasons: Direction of a multidisciplinary physical rehabilitation program to maximize functional independence : Yes Medical management of patient stability for increased activity during participation in an intensive rehabilitation regime.: Yes Analysis of laboratory values and/or radiology reports with any subsequent need for medication adjustment and/or medical intervention. : Yes   I attest that I was present, lead the team conference, and concur with the assessment and plan of the team.  Adrian [name] 08/15/2023, 2:27 PM

## 2023-08-15 NOTE — Progress Notes (Signed)
 Inpatient Rehabilitation Admission Medication Review by a Pharmacist  A complete drug regimen review was completed for this patient to identify any potential clinically significant medication issues.  High Risk Drug Classes Is patient taking? Indication by Medication  Antipsychotic No   Anticoagulant Yes SQ heparin  - VTE prophylaxis  Antibiotic No   Opioid No   Antiplatelet No   Hypoglycemics/insulin No   Vasoactive Medication Yes Amiodarone , metoprolol  - atrial fibrillation Amlodipine , metoprolol  - blood pressure  Chemotherapy No   Other No Atorvastatin  - hyperlipidemia Levothyroxine  - hypothyroid Senna-docusate - laxative  PRNs: Acetaminophen  - mild pain Maalox - indigestion Guaifenesin /dextromethorphan - cough Methocarbamol  - muscle specimens Ondansetron  - nausea, vomiting Bisacodyl , Fleets enema - constipation     Type of Medication Issue Identified Description of Issue Recommendation(s)  Drug Interaction(s) (clinically significant)     Duplicate Therapy     Allergy     No Medication Administration End Date     Incorrect Dose     Additional Drug Therapy Needed     Significant med changes from prior encounter (inform family/care partners about these prior to discharge). Apixaban  reversed and discontinued.    New Amlodipine , senna-docusate Atorvastatin  dose increased (LDL 117) Notes plan to follow up with Neuro as outpatient for possibly resuming or ASPIRE study, if stable.  Communicate changes with patient/family at discharge.  Other       Clinically significant medication issues were identified that warrant physician communication and completion of prescribed/recommended actions by midnight of the next day:  No  Pharmacist comments:   - SQ heparin  begun 08/13/23 per okay with Neuro  Time spent performing this drug regimen review (minutes):  20   Genaro Zebedee Calin, [place] 08/15/2023 12:18 PM

## 2023-08-15 NOTE — Progress Notes (Signed)
 Inpatient Rehabilitation  Patient information reviewed and entered into eRehab system by [facility]. Karen Kays., CCC/SLP, PPS Coordinator.  Information including medical coding, functional ability and quality indicators will be reviewed and updated through discharge.

## 2023-08-15 NOTE — Progress Notes (Signed)
 PROGRESS NOTE   Subjective/Complaints:  No issues overnite , coughing with all consistencies per SLP  ROS- neg CP, SOB, N/V/D  Objective:   No results found. Recent Labs    08/14/23 0552 08/15/23 0508  WBC 3.1* 2.7*  HGB 15.5 15.5  HCT 44.1 44.3  PLT 131* 126*   Recent Labs    08/14/23 0552 08/15/23 0508  NA 135 133*  K 4.1 4.2  CL 98 100  CO2 26 25  GLUCOSE 102* 105*  BUN 18 18  CREATININE 1.06 0.88  CALCIUM  9.0 9.1    Intake/Output Summary (Last 24 hours) at 08/15/2023 9178 Last data filed at 08/15/2023 0755 Gross per 24 hour  Intake 354 ml  Output 450 ml  Net -96 ml        Physical Exam: Vital Signs Blood pressure 123/70, pulse (!) 51, temperature 98.2 F (36.8 C), temperature source Oral, resp. rate 18, height 5' 8 (1.727 m), weight 60.5 kg, SpO2 100%.   General: No acute distress Mood and affect are appropriate Heart: Regular rate and rhythm no rubs murmurs or extra sounds Lungs: Clear to auscultation, breathing unlabored, no rales or wheezes Abdomen: Positive bowel sounds, soft nontender to palpation, nondistended Extremities: No clubbing, cyanosis, or edema Skin: No evidence of breakdown, no evidence of rash Neurologic: Cranial nerves II through XII intact, motor strength is 5/5 in lef tand 3- R deltoid, bicep, tricep, grip,5/5 left and 2- right  hip flexor, knee extensors,  0/5 on righ t5/5 left ankle dorsiflexor and plantar flexor Sensory exam normal sensation to light touch  in bilateral upper and lower extremities Intact finger to thumb opposition Musculoskeletal: Full range of motion in all 4 extremities. No joint swelling   Assessment/Plan: 1. Functional deficits which require 3+ hours per day of interdisciplinary therapy in a comprehensive inpatient rehab setting. Physiatrist is providing close team supervision and 24 hour management of active medical problems listed  below. Physiatrist and rehab team continue to assess barriers to discharge/monitor patient progress toward functional and medical goals  Care Tool:  Bathing              Bathing assist       Upper Body Dressing/Undressing Upper body dressing        Upper body assist      Lower Body Dressing/Undressing Lower body dressing            Lower body assist       Toileting Toileting    Toileting assist       Transfers Chair/bed transfer  Transfers assist           Locomotion Ambulation   Ambulation assist              Walk 10 feet activity   Assist           Walk 50 feet activity   Assist           Walk 150 feet activity   Assist           Walk 10 feet on uneven surface  activity   Assist           Wheelchair  Assist               Wheelchair 50 feet with 2 turns activity    Assist            Wheelchair 150 feet activity     Assist          Blood pressure 123/70, pulse (!) 51, temperature 98.2 F (36.8 C), temperature source Oral, resp. rate 18, height 5' 8 (1.727 m), weight 60.5 kg, SpO2 100%.  Medical Problem List and Plan: 1. Functional deficits secondary to left basal ganglia hemorrhage d/t hypertension and anticoagulation  with subsequent right hemiparesis             -patient may  shower             -ELOS/Goals: 12-16 days, goals mod I with PT and OT and independent with SLP              -PRAFO while in bed to stretch right heel cord. I suspect he will need an AFO for gait. 2.  Antithrombotics: -DVT/anticoagulation:  Pharmaceutical: Heparin              -antiplatelet therapy: none   3. Pain Management: Tylenol  as needed   4. Mood/Behavior/Sleep: LCSW to evaluate and provide emotional support             -antipsychotic agents: n/a   5. Neuropsych/cognition: This patient is capable of making decisions on his own behalf although he appears to have some short term memory  deficits   6. Skin/Wound Care: Routine skin care checks   7. Fluids/Electrolytes/Nutrition: Routine Is and Os and follow-up chemistries   8: Hypertension: monitor TID and prn. Fair control at present             -Continue amlodipine  2.5 mg daily             -Continue metoprolol  tartrate 50 mg twice daily   Vitals:   08/15/23 0444 08/15/23 0934  BP: 123/70 135/79  Pulse: (!) 51 (!) 56  Resp: 18   Temp: 98.2 F (36.8 C)   SpO2: 100%     9: Hyperlipidemia: continue Lipitor  80 mg daily   10: Hypothyroidism: Continue Synthroid  125 mcg every morning   11: Tobacco use: Cessation counseling   12: Atrial fibrillation: Now off Eliquis  due to ICH.  Will follow-up with neurology in 2 weeks to consider aspire trial, restart Eliquis  in 2 weeks if stable and follow-up with cardiology.   13: Chronic leukopenia: Follow-up CBC   14: Mild thrombocytopenia: Follow-up CBC      LOS: 1 days A FACE TO FACE EVALUATION WAS PERFORMED  Adrian [name] 08/15/2023, 8:21 AM

## 2023-08-15 NOTE — Plan of Care (Signed)
 Problem: RH Balance Goal: LTG Patient will maintain dynamic sitting balance (PT) Description: LTG:  Patient will maintain dynamic sitting balance with assistance during mobility activities (PT) Flowsheets (Taken 08/15/2023 1324) LTG: Pt will maintain dynamic sitting balance during mobility activities with:: Independent with assistive device  Goal: LTG Patient will maintain dynamic standing balance (PT) Description: LTG:  Patient will maintain dynamic standing balance with assistance during mobility activities (PT) Flowsheets (Taken 08/15/2023 1324) LTG: Pt will maintain dynamic standing balance during mobility activities with:: Supervision/Verbal cueing   Problem: Sit to Stand Goal: LTG:  Patient will perform sit to stand with assistance level (PT) Description: LTG:  Patient will perform sit to stand with assistance level (PT) Flowsheets (Taken 08/15/2023 1324) LTG: PT will perform sit to stand in preparation for functional mobility with assistance level: Independent with assistive device   Problem: RH Bed Mobility Goal: LTG Patient will perform bed mobility with assist (PT) Description: LTG: Patient will perform bed mobility with assistance, with/without cues (PT). Flowsheets (Taken 08/15/2023 1324) LTG: Pt will perform bed mobility with assistance level of: Independent with assistive device    Problem: RH Bed to Chair Transfers Goal: LTG Patient will perform bed/chair transfers w/assist (PT) Description: LTG: Patient will perform bed to chair transfers with assistance (PT). Flowsheets (Taken 08/15/2023 1324) LTG: Pt will perform Bed to Chair Transfers with assistance level: Independent with assistive device    Problem: RH Car Transfers Goal: LTG Patient will perform car transfers with assist (PT) Description: LTG: Patient will perform car transfers with assistance (PT). Flowsheets (Taken 08/15/2023 1324) LTG: Pt will perform car transfers with assist:: Supervision/Verbal cueing   Problem:  RH Furniture Transfers Goal: LTG Patient will perform furniture transfers w/assist (OT/PT) Description: LTG: Patient will perform furniture transfers  with assistance (OT/PT). Flowsheets (Taken 08/15/2023 1324) LTG: Pt will perform furniture transfers with assist:: Independent with assistive device    Problem: RH Ambulation Goal: LTG Patient will ambulate in controlled environment (PT) Description: LTG: Patient will ambulate in a controlled environment, # of feet with assistance (PT). Flowsheets (Taken 08/15/2023 1324) LTG: Pt will ambulate in controlled environ  assist needed:: Supervision/Verbal cueing LTG: Ambulation distance in controlled environment: 75 feet with LRAD Goal: LTG Patient will ambulate in home environment (PT) Description: LTG: Patient will ambulate in home environment, # of feet with assistance (PT). Flowsheets (Taken 08/15/2023 1324) LTG: Pt will ambulate in home environ  assist needed:: Supervision/Verbal cueing LTG: Ambulation distance in home environment: 50 feet with LRAD   Problem: RH Wheelchair Mobility Goal: LTG Patient will propel w/c in controlled environment (PT) Description: LTG: Patient will propel wheelchair in controlled environment, # of feet with assist (PT) Flowsheets (Taken 08/15/2023 1324) LTG: Pt will propel w/c in controlled environ  assist needed:: Independent with assistive device LTG: Propel w/c distance in controlled environment: 150 Goal: LTG Patient will propel w/c in home environment (PT) Description: LTG: Patient will propel wheelchair in home environment, # of feet with assistance (PT). Flowsheets (Taken 08/15/2023 1324) LTG: Pt will propel w/c in home environ  assist needed:: Independent with assistive device LTG: Propel w/c distance in home environment: 75   Problem: RH Stairs Goal: LTG Patient will ambulate up and down stairs w/assist (PT) Description: LTG: Patient will ambulate up and down # of stairs with assistance (PT) Flowsheets (Taken  08/15/2023 1324) LTG: Pt will ambulate up/down stairs assist needed:: Minimal Assistance - Patient > 75% LTG: Pt will  ambulate up and down number of stairs: 2 steps with  LRAD per home set up

## 2023-08-15 NOTE — Progress Notes (Signed)
 Inpatient Rehabilitation Care Coordinator Assessment and Plan Patient Details  Name: Adrian [name] MRN: [id number] Date of Birth: 02/08/1956  Today's Date: 08/15/2023  Hospital Problems: Principal Problem:   ICH (intracerebral hemorrhage) [facility])  Past Medical History:  Past Medical History:  Diagnosis Date   AF (paroxysmal atrial fibrillation) (HCC) 11/29/2018   Atrial flutter (HCC) 10/12/2015   a. TEE 3/17 with ? LAA clot-->s/p TEE/DCCV 11/24/2015; s/p DCCV 12/2022;s/p ablation 06/2023   CAD (coronary artery disease), native coronary artery 12/06/2015   a. 10/2015 MV: EF  37%, reversible defect inferior apex, intermediate risk findings; b. 10/2015 Cath: 20% mid RCA.   Carotid artery stenosis    1-39% right ICA stenosis and occluded left ICA   Colonic diverticular abscess    Diverticulitis    History of chemotherapy 2005   Cisplatin   Hypothyroidism    NICM (nonischemic cardiomyopathy) (HCC) 10/14/2015   a. Tachy mediated?;  b. Echo 3/17 - Mild concentric LVH, EF 30-35%, anteroseptal, anterior, anterolateral, apical anterior, lateral hypokinesis, trivial MR, mild to moderately reduced RVSF; c. LHC 3/17 - mRCA 20%   Radiation NOv.3,2005-Dec. 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  XRT   Past Surgical History:  Past Surgical History:  Procedure Laterality Date   A-FLUTTER ABLATION N/A 06/19/2023   Procedure: A-FLUTTER ABLATION;  Surgeon: Cindie Ole DASEN, MD;  Location: [facility] INVASIVE CV LAB;  Service: Cardiovascular;  Laterality: N/A;   APPENDECTOMY N/A 03/07/2019   Procedure: ROBOTIC ASSISTED APPENDECTOMY;  Surgeon: Sheldon Standing, MD;  Location: [facility] ORS;  Service: General;  Laterality: N/A;   CARDIAC CATHETERIZATION N/A 10/18/2015   Procedure: Left Heart Cath and Coronary Angiography;  Surgeon: Lonni JONETTA Cash, MD;  Location: [facility] INVASIVE CV LAB;  Service: Cardiovascular;  Laterality: N/A;   CARDIOVERSION N/A 11/24/2015   Procedure:  CARDIOVERSION;  Surgeon: Maude JAYSON Emmer, MD;  Location: [facility] ENDOSCOPY;  Service: Cardiovascular;  Laterality: N/A;   CARDIOVERSION N/A 12/20/2022   Procedure: CARDIOVERSION;  Surgeon: Alvan Ronal BRAVO, MD;  Location: [facility] INVASIVE CV LAB;  Service: Cardiovascular;  Laterality: N/A;   CYSTOSCOPY WITH STENT PLACEMENT Bilateral 03/07/2019   Procedure: CYSTOSCOPY WITH BILATERAL FIREFLY INJECTION;  Surgeon: Cam Morene ORN, MD;  Location: [facility] ORS;  Service: Urology;  Laterality: Bilateral;   GASTROSTOMY TUBE PLACEMENT  07/04/2004   IR - G tube for tonsilar cancer   IR ANGIO INTRA EXTRACRAN SEL COM CAROTID INNOMINATE UNI R MOD SED  03/09/2019   IR ANGIO VERTEBRAL SEL VERTEBRAL UNI R MOD SED  03/09/2019   IR CT HEAD LTD  03/09/2019   IR PERCUTANEOUS ART THROMBECTOMY/INFUSION INTRACRANIAL INC DIAG ANGIO  03/09/2019   IR RADIOLOGIST EVAL & MGMT  12/18/2018   LAMINECTOMY     C5/placement of steel plate   LOOP RECORDER INSERTION N/A 06/19/2023   Procedure: LOOP RECORDER INSERTION;  Surgeon: Cindie Ole DASEN, MD;  Location: [facility] INVASIVE CV LAB;  Service: Cardiovascular;  Laterality: N/A;   NECK SURGERY  2003   replaced disk   RADIOLOGY WITH ANESTHESIA N/A 03/08/2019   Procedure: IR WITH ANESTHESIA;  Surgeon: Dolphus Carrion, MD;  Location: [facility] OR;  Service: Radiology;  Laterality: N/A;   TEE WITHOUT CARDIOVERSION N/A 10/15/2015   Procedure: TRANSESOPHAGEAL ECHOCARDIOGRAM (TEE);  Surgeon: Ezra GORMAN Shuck, MD;  Location: [facility] ENDOSCOPY;  Service: Cardiovascular;  Laterality: N/A;   TEE WITHOUT CARDIOVERSION N/A 11/24/2015   Procedure: TRANSESOPHAGEAL ECHOCARDIOGRAM (TEE);  Surgeon: Maude JAYSON Emmer,  MD;  Location: [facility] ENDOSCOPY;  Service: Cardiovascular;  Laterality: N/A;   TEE WITHOUT CARDIOVERSION N/A 12/20/2022   Procedure: TRANSESOPHAGEAL ECHOCARDIOGRAM;  Surgeon: Alvan Ronal BRAVO, MD;  Location: [facility] INVASIVE CV LAB;  Service: Cardiovascular;  Laterality: N/A;   XI ROBOTIC ASSISTED COLOSTOMY TAKEDOWN N/A 03/07/2019    Procedure: XI ROBOTIC ASSISTED LOW ANTERIOR RESECTION, RIGID PROCTOSCOPY;  Surgeon: Sheldon Standing, MD;  Location: [facility] ORS;  Service: General;  Laterality: N/A;   Social History:  reports that he has been smoking cigarettes. He has a 10 pack-year smoking history. He has never used smokeless tobacco. He reports current alcohol use of about 12.0 standard drinks of alcohol per week. He reports that he does not use drugs.  Family / Support Systems Marital Status: Separated How Long?: since 2015 Patient Roles: Other (Comment), Parent (ex-wife) Spouse/Significant Other: ex wife Adrian [name] Children: 2 children- Consuelo (lives in [place], [place]) and [place] ([place], [place]) Other Supports: two best friends Anticipated Caregiver: ex-wife Ability/Limitations of Caregiver: Ex-wife works 5 days per week; remote one day per week. Will see if tewo bestfriends and their dtr can come up to help assist with care. Caregiver Availability: Intermittent Family Dynamics: Pt lives alone.  Social History Preferred language: English Religion: Presbyterian Cultural Background: Pt worked at [employer] until his recent retirement earlier this year Education: some college Primary School Teacher - How often do you need to have someone help you when you read instructions, pamphlets, or other written material from your doctor or pharmacy?: Never Writes: Yes Employment Status: Retired Date Retired/Disabled/Unemployed: December 2024 Age Retired: 42 Legal History/Current Legal Issues: Denies Guardian/Conservator: No HCPOA- designates his ex-wife Airline Pilot   Abuse/Neglect Abuse/Neglect Assessment Can Be Completed: Yes Physical Abuse: Denies Verbal Abuse: Denies Sexual Abuse: Denies Exploitation of patient/patient's resources: Denies Self-Neglect: Denies  Patient response to: Social Isolation - How often do you feel lonely or isolated from those around you?: Never  Emotional Status Pt's affect, behavior and adjustment status: Pt in  good spirits at time of visit Recent Psychosocial Issues: Denie Psychiatric History: Denies Substance Abuse History: Pt admits to smoking 3/4 pk of cigarettes per day; etoh use- 2 mixed drinks with bourbon daily; denies rec drug use  Patient / Family Perceptions, Expectations & Goals Pt/Family understanding of illness & functional limitations: Pt and family have a general understanding of care needs Premorbid pt/family roles/activities: Independent Anticipated changes in roles/activities/participation: Assistance with ADLs/IADLs Pt/family expectations/goals: Pt goal is to work towards getting his right side to improve, toes, ankle, arm, etc.  Building Surveyor: None Premorbid Home Care/DME Agencies: None Transportation available at discharge: TBD Is the patient able to respond to transportation needs?: Yes In the past 12 months, has lack of transportation kept you from medical appointments or from getting medications?: No In the past 12 months, has lack of transportation kept you from meetings, work, or from getting things needed for daily living?: No Resource referrals recommended: Neuropsychology  Discharge Planning Living Arrangements: Non-relatives/Friends, Alone Support Systems: Children, Friends/neighbors Type of Residence: Private residence Insurance Resources: Media Planner (specify) HERBALIST) Financial Resources: Restaurant Manager, Fast Food Screen Referred: No Living Expenses: Banker Management: Patient Does the patient have any problems obtaining your medications?: No Home Management: Pt manages his own home care needs Patient/Family Preliminary Plans: TBD Care Coordinator Barriers to Discharge: Decreased caregiver support, Lack of/limited family support, Insurance for SNF coverage Care Coordinator Anticipated Follow Up Needs: HH/OP  Clinical Impression SW met with pt at bedside to introduce self, explain role, and  discuss discharge process.  Pt is not a cytogeneticist. No HCPOA. SW will confirm if DME.   Binh Doten A Claudia Greenley 08/15/2023, 4:03 PM

## 2023-08-15 NOTE — Evaluation (Signed)
 Occupational Therapy Assessment and Plan  Patient Details  Name: Adrian [name] MRN: [id number] Date of Birth: May 30, 1956  OT Diagnosis: hemiplegia affecting dominant side and muscle weakness (generalized) Rehab Potential: Rehab Potential (ACUTE ONLY): Good ELOS: ~ 12-14 days   Today's Date: 08/15/2023 OT Individual Time: 1315-1415 OT Individual Time Calculation (min): 60 min     Hospital Problem: Principal Problem:   ICH (intracerebral hemorrhage) (HCC)   Past Medical History:  Past Medical History:  Diagnosis Date   AF (paroxysmal atrial fibrillation) (HCC) 11/29/2018   Atrial flutter (HCC) 10/12/2015   a. TEE 3/17 with ? LAA clot-->s/p TEE/DCCV 11/24/2015; s/p DCCV 12/2022;s/p ablation 06/2023   CAD (coronary artery disease), native coronary artery 12/06/2015   a. 10/2015 MV: EF  37%, reversible defect inferior apex, intermediate risk findings; b. 10/2015 Cath: 20% mid RCA.   Carotid artery stenosis    1-39% right ICA stenosis and occluded left ICA   Colonic diverticular abscess    Diverticulitis    History of chemotherapy 2005   Cisplatin   Hypothyroidism    NICM (nonischemic cardiomyopathy) (HCC) 10/14/2015   a. Tachy mediated?;  b. Echo 3/17 - Mild concentric LVH, EF 30-35%, anteroseptal, anterior, anterolateral, apical anterior, lateral hypokinesis, trivial MR, mild to moderately reduced RVSF; c. LHC 3/17 - mRCA 20%   Radiation NOv.3,2005-Dec. 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  XRT   Past Surgical History:  Past Surgical History:  Procedure Laterality Date   A-FLUTTER ABLATION N/A 06/19/2023   Procedure: A-FLUTTER ABLATION;  Surgeon: Cindie Ole DASEN, MD;  Location: [facility] INVASIVE CV LAB;  Service: Cardiovascular;  Laterality: N/A;   APPENDECTOMY N/A 03/07/2019   Procedure: ROBOTIC ASSISTED APPENDECTOMY;  Surgeon: Sheldon Standing, MD;  Location: [facility] ORS;  Service: General;  Laterality: N/A;   CARDIAC CATHETERIZATION N/A 10/18/2015    Procedure: Left Heart Cath and Coronary Angiography;  Surgeon: Lonni JONETTA Cash, MD;  Location: [facility] INVASIVE CV LAB;  Service: Cardiovascular;  Laterality: N/A;   CARDIOVERSION N/A 11/24/2015   Procedure: CARDIOVERSION;  Surgeon: Maude JAYSON Emmer, MD;  Location: [facility] ENDOSCOPY;  Service: Cardiovascular;  Laterality: N/A;   CARDIOVERSION N/A 12/20/2022   Procedure: CARDIOVERSION;  Surgeon: Alvan Ronal BRAVO, MD;  Location: [facility] INVASIVE CV LAB;  Service: Cardiovascular;  Laterality: N/A;   CYSTOSCOPY WITH STENT PLACEMENT Bilateral 03/07/2019   Procedure: CYSTOSCOPY WITH BILATERAL FIREFLY INJECTION;  Surgeon: Cam Morene ORN, MD;  Location: [facility] ORS;  Service: Urology;  Laterality: Bilateral;   GASTROSTOMY TUBE PLACEMENT  07/04/2004   IR - G tube for tonsilar cancer   IR ANGIO INTRA EXTRACRAN SEL COM CAROTID INNOMINATE UNI R MOD SED  03/09/2019   IR ANGIO VERTEBRAL SEL VERTEBRAL UNI R MOD SED  03/09/2019   IR CT HEAD LTD  03/09/2019   IR PERCUTANEOUS ART THROMBECTOMY/INFUSION INTRACRANIAL INC DIAG ANGIO  03/09/2019   IR RADIOLOGIST EVAL & MGMT  12/18/2018   LAMINECTOMY     C5/placement of steel plate   LOOP RECORDER INSERTION N/A 06/19/2023   Procedure: LOOP RECORDER INSERTION;  Surgeon: Cindie Ole DASEN, MD;  Location: [facility] INVASIVE CV LAB;  Service: Cardiovascular;  Laterality: N/A;   NECK SURGERY  2003   replaced disk   RADIOLOGY WITH ANESTHESIA N/A 03/08/2019   Procedure: IR WITH ANESTHESIA;  Surgeon: Dolphus Carrion, MD;  Location: [facility] OR;  Service: Radiology;  Laterality: N/A;   TEE WITHOUT CARDIOVERSION N/A 10/15/2015   Procedure:  TRANSESOPHAGEAL ECHOCARDIOGRAM (TEE);  Surgeon: Ezra GORMAN Shuck, MD;  Location: [facility] ENDOSCOPY;  Service: Cardiovascular;  Laterality: N/A;   TEE WITHOUT CARDIOVERSION N/A 11/24/2015   Procedure: TRANSESOPHAGEAL ECHOCARDIOGRAM (TEE);  Surgeon: Maude JAYSON Emmer, MD;  Location: [facility] ENDOSCOPY;  Service: Cardiovascular;  Laterality: N/A;   TEE WITHOUT CARDIOVERSION N/A 12/20/2022    Procedure: TRANSESOPHAGEAL ECHOCARDIOGRAM;  Surgeon: Alvan Ronal BRAVO, MD;  Location: [facility] INVASIVE CV LAB;  Service: Cardiovascular;  Laterality: N/A;   XI ROBOTIC ASSISTED COLOSTOMY TAKEDOWN N/A 03/07/2019   Procedure: XI ROBOTIC ASSISTED LOW ANTERIOR RESECTION, RIGID PROCTOSCOPY;  Surgeon: Sheldon Standing, MD;  Location: [facility] ORS;  Service: General;  Laterality: N/A;    Assessment & Plan Clinical Impression: Patient is a 68 y.o. year old male resented to the emergency department on 08/11/2023 reporting he woke up in his usual state of health and at approximately 11 AM noted abrupt onset of right-sided weakness. He was brought to the emergency department as code stroke. He was hypertensive to systolic blood pressure to 230 per EMS. CT scan of the head without contrast revealed left basal ganglia intracranial hemorrhage with intraventricular extension. CTA showed spots sign and blood pressure parameters were tightened to a goal of 110-130. He was admitted to the ICU for strict blood pressure control. His past medical history significant for atrial fibrillation on Eliquis , carotid artery stenosis, hypothyroidism, dispose loop recorder placed 06/19/2023 post A-fib ablation, chronic left ICA occlusion, tobacco use, hyperlipidemia, chronic leukopenia. Examination he exhibited right-sided facial droop, right upper and lower extremity weakness and dysarthria. 2D echo performed with left ventricular ejection fraction 60 to 65%. Hemoglobin A1c 5.6%. SCDs placed for VTE prophylaxis. The patient will follow-up with Dr. Rosemarie as an outpatient to consider ASPIRE trial if interested. Alternatively to restart Eliquis  in 2 weeks if stable or follow-up with cardiology for other options including Watchman device. He continues on amlodipine  2.5 mg daily and Lopressor  25 mg daily. Lipitor  increased to 80 mg daily. Resume home Synthroid . To resume amiodarone  200 mg daily on admission to CIR. Now on heparin  5000 units 3 times daily for  DVT prophylaxis. Advised regarding tobacco and alcohol use. Right hemiparesis persists. Tolerating heart healthy diet with thin liquids.  Patient transferred to CIR on 08/14/2023 .    Patient currently requires mod with basic self-care skills and min A for functional mobility   secondary to muscle weakness, decreased cardiorespiratoy endurance, impaired timing and sequencing, unbalanced muscle activation, and decreased coordination, decreased attention to right, decreased awareness and decreased safety awareness, and decreased standing balance, hemiplegia, and decreased balance strategies.  Prior to hospitalization, patient could complete ADL with independent .  Patient will benefit from skilled intervention to decrease level of assist with basic self-care skills and increase independence with basic self-care skills prior to discharge home with care partner.  Anticipate patient will require intermittent supervision and follow up outpatient.  OT - End of Session Activity Tolerance: Tolerates 10 - 20 min activity with multiple rests Endurance Deficit: Yes OT Assessment Rehab Potential (ACUTE ONLY): Good OT Patient demonstrates impairments in the following area(s): Balance;Pain;Cognition;Safety;Perception;Sensory;Endurance;Motor OT Basic ADL's Functional Problem(s): Eating;Grooming;Bathing;Dressing;Toileting OT Transfers Functional Problem(s): Toilet;Tub/Shower OT Additional Impairment(s): Fuctional Use of Upper Extremity OT Plan OT Intensity: Minimum of 1-2 x/day, 45 to 90 minutes OT Frequency: 5 out of 7 days OT Duration/Estimated Length of Stay: ~ 12-14 days OT Treatment/Interventions: Balance/vestibular training;Community reintegration;Disease mangement/prevention;Functional electrical stimulation;Neuromuscular re-education;Patient/family education;Self Care/advanced ADL retraining;Therapeutic Exercise;UE/LE Coordination activities;Cognitive remediation/compensation;Discharge planning;DME/adaptive  equipment instruction;Functional mobility training;Pain management;Psychosocial  support;Skin care/wound managment;Therapeutic Activities;UE/LE Strength taining/ROM OT Self Feeding Anticipated Outcome(s): supervision with domiant hand OT Basic Self-Care Anticipated Outcome(s): supervision OT Toileting Anticipated Outcome(s): supervision OT Bathroom Transfers Anticipated Outcome(s): supervision OT Recommendation Recommendations for Other Services: Neuropsych consult;Therapeutic Recreation consult Patient destination: Home Follow Up Recommendations: Outpatient OT Equipment Recommended: To be determined   OT Evaluation Precautions/Restrictions  Precautions Precautions: Fall Precaution Comments: R hemi Restrictions Weight Bearing Restrictions Per Provider Order: No General Chart Reviewed: Yes Family/Caregiver Present: No Vital Signs Therapy Vitals Temp: 97.8 F (36.6 C) Temp Source: Oral Pulse Rate: (!) 56 Resp: 16 BP: (!) 140/75 Patient Position (if appropriate): Lying Oxygen Therapy SpO2: 100 % O2 Device: Room Air Pain   Home Living/Prior Functioning Home Living Family/patient expects to be discharged to:: Private residence Living Arrangements: Alone Available Help at Discharge: Family, Friend(s) Type of Home: House Home Access: Stairs to enter Secretary/administrator of Steps: 2 Entrance Stairs-Rails: Right Home Layout: One level Bathroom Shower/Tub: Engineer, Manufacturing Systems: Standard Bathroom Accessibility: Yes Additional Comments: Pt is currently separated from wife, but his wife states she plans to have him live with her and be his caregiver. Wife will be working from home. Pt wife is a scientific laboratory technician for [employer].  Lives With: Alone Prior Function Level of Independence: Independent with basic ADLs, Independent with gait, Independent with transfers, Independent with homemaking with ambulation  Able to Take Stairs?: Yes Driving: Yes Vocation: Retired  (retired 7 weeks ago, previously worked in Administrator) Vision Baseline Vision/History: 1 Wears glasses Ability to See in Adequate Light: 0 Adequate Patient Visual Report: No change from baseline Vision Assessment?: No apparent visual deficits Perception  Perception: Impaired Perception-Other Comments: R sided UE/LE neglect/inattention UE>LE Praxis Praxis: Impaired Praxis Impairment Details: Motor planning;Organization Cognition Cognition Overall Cognitive Status: Within Functional Limits for tasks assessed Arousal/Alertness: Awake/alert Orientation Level: Person;Place;Situation Person: Oriented Place: Oriented Situation: Oriented Memory: Impaired Memory Impairment: Storage deficit;Retrieval deficit Attention: Sustained Sustained Attention: Impaired Sustained Attention Impairment: Functional basic Awareness: Impaired Awareness Impairment: Emergent impairment Problem Solving: Impaired Problem Solving Impairment: Verbal complex;Functional complex Safety/Judgment: Impaired Brief Interview for Mental Status (BIMS) Repetition of Three Words (First Attempt): 3 Temporal Orientation: Year: Correct Temporal Orientation: Month: Accurate within 5 days Temporal Orientation: Day: Correct Recall: Sock: No, could not recall Recall: Blue: Yes, after cueing (a color) Recall: Bed: No, could not recall BIMS Summary Score: 10 Sensation Sensation Light Touch: Appears Intact Proprioception: Impaired Detail Proprioception Impaired Details: Impaired RLE;Impaired RUE Additional Comments: ligth touch intact B LE, pt denies any N&T, pt reports sensation symmetrical B LE Coordination Gross Motor Movements are Fluid and Coordinated: No Fine Motor Movements are Fluid and Coordinated: No Coordination and Movement Description: limited 2/2 R hemi Motor  Motor Motor: Hemiplegia Motor - Skilled Clinical Observations: R hemi  Trunk/Postural Assessment  Cervical Assessment Cervical  Assessment: Within Functional Limits Thoracic Assessment Thoracic Assessment: Within Functional Limits Lumbar Assessment Lumbar Assessment: Within Functional Limits Postural Control Postural Control: Deficits on evaluation Righting Reactions: delayed  Balance Balance Balance Assessed: Yes Static Sitting Balance Static Sitting - Level of Assistance: 5: Stand by assistance Dynamic Sitting Balance Dynamic Sitting - Level of Assistance: 5: Stand by assistance Static Standing Balance Static Standing - Balance Support: No upper extremity supported;During functional activity Static Standing - Level of Assistance: 4: Min assist Dynamic Standing Balance Dynamic Standing - Balance Support: During functional activity;No upper extremity supported;Bilateral upper extremity supported Dynamic Standing - Level of Assistance: 4: Min assist Extremity/Trunk Assessment RUE Assessment RUE  Assessment: Exceptions to [facility] RUE Body System: Neuro Brunstrum levels for arm and hand: Arm;Hand Brunstrum level for arm: Stage V Relative Independence from Synergy Brunstrum level for hand: Stage V Independence from basic synergies RUE Tone RUE Tone: Within Functional Limits LUE Assessment LUE Assessment: Within Functional Limits  Care Tool Care Tool Self Care Eating   Eating Assist Level: Set up assist    Oral Care    Oral Care Assist Level: Contact Guard/Toucning assist    Bathing   Body parts bathed by patient: Right arm;Chest;Abdomen;Front perineal area;Right upper leg;Buttocks;Left upper leg;Right lower leg;Left lower leg;Face Body parts bathed by helper: Left arm   Assist Level: Minimal Assistance - Patient > 75%    Upper Body Dressing(including orthotics)   What is the patient wearing?: Pull over shirt   Assist Level: Maximal Assistance - Patient 25 - 49%    Lower Body Dressing (excluding footwear)   What is the patient wearing?: Pants Assist for lower body dressing: Maximal Assistance -  Patient 25 - 49%    Putting on/Taking off footwear   What is the patient wearing?: Non-skid slipper socks Assist for footwear: Moderate Assistance - Patient 50 - 74%       Care Tool Toileting Toileting activity   Assist for toileting: Moderate Assistance - Patient 50 - 74%     Care Tool Bed Mobility Roll left and right activity   Roll left and right assist level: Supervision/Verbal cueing    Sit to lying activity   Sit to lying assist level: Supervision/Verbal cueing    Lying to sitting on side of bed activity   Lying to sitting on side of bed assist level: the ability to move from lying on the back to sitting on the side of the bed with no back support.: Supervision/Verbal cueing     Care Tool Transfers Sit to stand transfer   Sit to stand assist level: Minimal Assistance - Patient > 75%    Chair/bed transfer   Chair/bed transfer assist level: Minimal Assistance - Patient > 75%     Toilet transfer   Assist Level: Minimal Assistance - Patient > 75%     Care Tool Cognition  Expression of Ideas and Wants Expression of Ideas and Wants: 3. Some difficulty - exhibits some difficulty with expressing needs and ideas (e.g, some words or finishing thoughts) or speech is not clear  Understanding Verbal and Non-Verbal Content Understanding Verbal and Non-Verbal Content: 4. Understands (complex and basic) - clear comprehension without cues or repetitions   Memory/Recall Ability     Refer to Care Plan for Long Term Goals  SHORT TERM GOAL WEEK 1 OT Short Term Goal 1 (Week 1): Pt will be able to pick up dinger foods to feed self with min A OT Short Term Goal 2 (Week 1): Pt will perform sit to stands in ADL without UE support wtih contact guard demonstrating improved balance OT Short Term Goal 3 (Week 1): Pt will don LB clothing including footwear with min A OT Short Term Goal 4 (Week 1): pt will perform toileting with min A for balance  Recommendations for other services: Neuropsych  and Therapeutic Recreation  Outing/community reintegration   Skilled Therapeutic Intervention 1:1 Ot eval initiated with OT purpose, role and goals discussed. Pt received in the bed. Self care retraining at shower level. Pt reported toileting needs. Transferred from bed to w/c <toilet<w/c to tub bench in shower. Focus on use of right hand in all tasks varying from gross  assist to use at non dominant level with assist/ support. Sit to stands in the shower required min to mod A to maintain balance - leaning to the right with decr attention to sustained activity in right LE. Demonstrated and taught hemi dressing techniques for donning shirt and pants. Continued practice [facility] with right hand opening toothpaste and turning on/ off water  controls at sink. Assisted pt with shaving at the sink. Transferred back into bed with min A. Pt left resting in the bed with call bell in hand.    ADL ADL Eating: Set up Grooming: Setup Upper Body Bathing: Minimal assistance Where Assessed-Upper Body Bathing: Shower Lower Body Bathing: Minimal assistance Where Assessed-Lower Body Bathing: Shower Upper Body Dressing: Maximal assistance Lower Body Dressing: Maximal assistance Toileting: Moderate assistance Where Assessed-Toileting: Teacher, Adult Education: Curator Method: Risk Manager: Insurance Underwriter Method: Stand pivot Mobility  Bed Mobility Bed Mobility: Rolling Right;Rolling Left;Supine to Sit;Sit to Supine Rolling Right: Supervision/verbal cueing Rolling Left: Supervision/Verbal cueing Supine to Sit: Supervision/Verbal cueing Sit to Supine: Supervision/Verbal cueing Transfers Sit to Stand: Minimal Assistance - Patient > 75% Stand to Sit: Minimal Assistance - Patient > 75%   Discharge Criteria: Patient will be discharged from OT if patient refuses treatment 3 consecutive times without medical reason, if treatment goals not met,  if there is a change in medical status, if patient makes no progress towards goals or if patient is discharged from hospital.  The above assessment, treatment plan, treatment alternatives and goals were discussed and mutually agreed upon: by patient  Claudene Delon Levy 08/15/2023, 3:03 PM

## 2023-08-15 NOTE — Plan of Care (Signed)
  Problem: RH Swallowing Goal: LTG Patient will consume least restrictive diet using compensatory strategies with assistance (SLP) Description: LTG:  Patient will consume least restrictive diet using compensatory strategies with assistance (SLP) Flowsheets (Taken 08/15/2023 0947) LTG: Pt Patient will consume least restrictive diet using compensatory strategies with assistance of (SLP): Modified Independent   Problem: RH Expression Communication Goal: LTG Patient will increase speech intelligibility (SLP) Description: LTG: Patient will increase speech intelligibility at word/phrase/conversation level with cues, % of the time (SLP) Flowsheets (Taken 08/15/2023 0947) LTG: Patient will increase speech intelligibility (SLP): Supervision Level: Conversation level Percent of time patient will use intelligible speech: 100 Goal: LTG Patient will increase word finding of common (SLP) Description: LTG:  Patient will increase word finding of common objects/daily info/abstract thoughts with cues using compensatory strategies (SLP). Flowsheets (Taken 08/15/2023 0947) LTG: Patient will increase word finding of common (SLP): Supervision Patient will use compensatory strategies to increase word finding of: Abstract thoughts   Problem: RH Problem Solving Goal: LTG Patient will demonstrate problem solving for (SLP) Description: LTG:  Patient will demonstrate problem solving for basic/complex daily situations with cues  (SLP) Flowsheets (Taken 08/15/2023 0947) LTG: Patient will demonstrate problem solving for (SLP): (mildly complex) Other (comment) LTG Patient will demonstrate problem solving for: Supervision   Problem: RH Memory Goal: LTG Patient will use memory compensatory aids to (SLP) Description: LTG:  Patient will use memory compensatory aids to recall biographical/new, daily complex information with cues (SLP) Flowsheets (Taken 08/15/2023 0947) LTG: Patient will use memory compensatory aids to (SLP):  Supervision

## 2023-08-16 ENCOUNTER — Inpatient Hospital Stay (HOSPITAL_COMMUNITY): Payer: Medicare Other

## 2023-08-16 DIAGNOSIS — Z720 Tobacco use: Secondary | ICD-10-CM | POA: Diagnosis not present

## 2023-08-16 DIAGNOSIS — I10 Essential (primary) hypertension: Secondary | ICD-10-CM | POA: Diagnosis not present

## 2023-08-16 DIAGNOSIS — I610 Nontraumatic intracerebral hemorrhage in hemisphere, subcortical: Secondary | ICD-10-CM | POA: Diagnosis not present

## 2023-08-16 NOTE — Progress Notes (Signed)
 Patient ID: Adrian [name], male   DOB: 1955-08-22, 68 y.o.   MRN: [id number]  SW received FMLA forms from pt ex-wife.  [id number]- SW informed pt ex-wife [name] on pt ELOS and received FMLA forms and will update once completed.   SW left FMLA forms with PA-Sandra.   Graeme Jude, MSW, LCSW Office: [phone] Cell: [phone] Fax: [phone]

## 2023-08-16 NOTE — Progress Notes (Signed)
 Occupational Therapy Session Note  Patient Details  Name: Adrian [name] MRN: [id number] Date of Birth: [birth year]  Today's Date: 08/16/2023 OT Individual Time: 9264-9167 OT Individual Time Calculation (min): 57 min    Short Term Goals: Week 1:  OT Short Term Goal 1 (Week 1): Pt will be able to pick up dinger foods to feed self with min A OT Short Term Goal 2 (Week 1): Pt will perform sit to stands in ADL without UE support wtih contact guard demonstrating improved balance OT Short Term Goal 3 (Week 1): Pt will don LB clothing including footwear with min A OT Short Term Goal 4 (Week 1): pt will perform toileting with min A for balance  Skilled Therapeutic Interventions/Progress Updates:  Pt greeted supine in bed, pt agreeable to OT intervention.      Transfers/bed mobility/functional mobility: supine>sit with CGA. Pt completed stand pivot transfer to w/c to R side with MIN A, MIN verbal cues needed for set- up and technique.   Therapeutic activity:    ADLs:  Grooming: pt completed seated grooming tasks at sink with MIN A, emphasis on using RUE as gross asssist.   Footwear: donned shoes with MAX A   Transfers: MIN A for stand pivot to BSC over toilet, MIN verbal cues for sequencing and technique  Toileting: pt required MIN A for 3/3 toileting tasks needing assist to manage pants on R side, continent urine void.    NMR: pt completed various activities in weightbearing position with pt leaning onto mat table with RUE positioned under shoulder for optimal joint protection with pt instructed to reach out of BOS with LUE to cross midline and match cones to dots.   Graded task up with pt able to transition into tall kneeling on mat table with MINA. Pt able to lean onto bench while reaching with LUE to cross midline and retrieve cones from mat table. Pt also able to transition from weightbearing onto forearms to hands on bench with an emphasis on motor planning and weightbearing into affected  extremity.     Ended session with pt supine in bed with all needs within reach and bed alarm activated.                 Therapy Documentation Precautions:  Precautions Precautions: Fall Precaution Comments: R hemi Restrictions Weight Bearing Restrictions Per Provider Order: No  Pain: No pain    Therapy/Group: Individual Therapy  Ronal Gift [facility] 08/16/2023, 8:34 AM

## 2023-08-16 NOTE — Care Management (Signed)
 [facility] Individual Statement of Services  Patient Name:  Adrian [name]  Date:  08/16/2023  Welcome to the [facility]patient [facility].  Our goal is to provide you with an individualized program based on your diagnosis and situation, designed to meet your specific needs.  With this comprehensive rehabilitation program, you will be expected to participate in at least 3 hours of rehabilitation therapies Monday-Friday, with modified therapy programming on the weekends.  Your rehabilitation program will include the following services:  Physical Therapy (PT), Occupational Therapy (OT), Speech Therapy (ST), 24 hour per day rehabilitation nursing, Therapeutic Recreaction (TR), Psychology, Neuropsychology, Care Coordinator, Rehabilitation Medicine, Nutrition Services, Pharmacy Services, and Other  Weekly team conferences will be held on Wednesdays to discuss your progress.  Your Inpatient Rehabilitation Care Coordinator will talk with you frequently to get your input and to update you on team discussions.  Team conferences with you and your family in attendance may also be held.  Expected length of stay: 12-16 days  Overall anticipated outcome: Independent with Assistive Device  Depending on your progress and recovery, your program may change. Your Inpatient Rehabilitation Care Coordinator will coordinate services and will keep you informed of any changes. Your Inpatient Rehabilitation Care Coordinator's name and contact numbers are listed  below.  The following services may also be recommended but are not provided by the Inpatient Rehabilitation Center:  Driving Evaluations Home Health Rehabiltiation Services Outpatient Rehabilitation Services Vocational Rehabilitation   Arrangements will be made to provide these services after discharge if needed.  Arrangements include referral to agencies that provide these services.  Your insurance has been verified to be:  BCBS  Your  primary doctor is:  Government Social Research Officer  Pertinent information will be shared with your doctor and your insurance company.  Inpatient Rehabilitation Care Coordinator:  Adrian [name] [phone] or [phone]  Information discussed with and copy given to patient by: Adrian [name], 08/16/2023, 9:54 AM

## 2023-08-16 NOTE — Procedures (Signed)
 Modified Barium Swallow Study  Patient Details  Name: Adrian [name] MRN: [id number] Date of Birth: 02-17-1956  Today's Date: 08/16/2023  Modified Barium Swallow completed.  Full report located under Chart Review in the Imaging Section.  History of Present Illness Adrian [name] is a 68 yo male presenting to ED 1/4 with sudden onset R sided weakness. CTH includes ICH centered in the L basal ganglia with intraventricular extension. Pt seen August-October 2020 for dysphagia and cognitive by SLP targeting executive functioning, memory, attention, aphasia, and anomia.  PMH includes A-fib, carotid artery stenosis, hypothyroidism, HLD   Clinical Impression Patient presents with moderate oropharyngeal dysphagia. Oral phase is characterized by reduced lingual control/coordination resulting in posterior spillage to the pyriforms and trace-mild oral residuals throughout the oral cavity. Pharyngeal phase is remarkable for incomplete epiglottic inversion, reduced laryngeal vestibular closure, diminished pharyngeal stripping wave, reduced hyoid excursion reduced tongue base retraction, and marked obstruction of PES opening. These deficits resulted in audible aspiration of thin liquids during consumption of pill (no other aspiration observed during thin liquids, occasional penetration) and penetration of NTL to vocal cords, though spontaneously cleared. Use of chin tuck and head turn to reduce penetration/aspiration was ineffective. No penetration/aspiration present during puree and solids. Moderate-severe pharyngeal residuals present throughout the pharyngeal cavity and worsened as viscosity increased. Residuals were effectively reduced with use of multiple swallows. Recommend continuation of current diet (regular/thin) with multiple dry swallows after solids. Medications should be consumed crushed in puree, as patient unable to swallow pill during study and concern for lodging in vallecula. Patient should consume  single, small sips of thin liquids with occasional throat clear. Recommend intermittent supervision to cue for compensatory strategies and initiation of pharyngeal strengthening exercises.    Swallow Evaluation Recommendations Recommendations: PO diet PO Diet Recommendation: Regular;Thin liquids (Level 0) Liquid Administration via: Cup;Straw Medication Administration: Crushed with puree Supervision: Patient able to self-feed;Staff to assist with self-feeding;Intermittent supervision/cueing for swallowing strategies Swallowing strategies  : Minimize environmental distractions;Slow rate;Small bites/sips;Multiple dry swallows after each bite/sip;Clear throat intermittently Postural changes: Position pt fully upright for meals Oral care recommendations: Oral care BID (2x/day)   Leianna Barga M.A., CF-SLP 08/16/2023,4:12 PM

## 2023-08-16 NOTE — Progress Notes (Signed)
 Hold metoprolol per Fermin Schwab. PA.    Tilden Dome, LPN

## 2023-08-16 NOTE — Progress Notes (Signed)
 PROGRESS NOTE   Subjective/Complaints:  Seen in PT, no pain issues , discussed possible need for AFO as well as general timeframe of functional recovery   ROS- neg CP, SOB, N/V/D, no abd pain or bladder issues   Objective:   No results found. Recent Labs    08/14/23 0552 08/15/23 0508  WBC 3.1* 2.7*  HGB 15.5 15.5  HCT 44.1 44.3  PLT 131* 126*   Recent Labs    08/14/23 0552 08/15/23 0508  NA 135 133*  K 4.1 4.2  CL 98 100  CO2 26 25  GLUCOSE 102* 105*  BUN 18 18  CREATININE 1.06 0.88  CALCIUM  9.0 9.1    Intake/Output Summary (Last 24 hours) at 08/16/2023 0810 Last data filed at 08/16/2023 0415 Gross per 24 hour  Intake 600 ml  Output 350 ml  Net 250 ml        Physical Exam: Vital Signs Blood pressure 137/82, pulse (!) 56, temperature 98.7 F (37.1 C), temperature source Oral, resp. rate 17, height 5' 8 (1.727 m), weight 60.5 kg, SpO2 98%.   General: No acute distress Mood and affect are appropriate Heart: Regular rate and rhythm no rubs murmurs or extra sounds Lungs: Clear to auscultation, breathing unlabored, no rales or wheezes Abdomen: Positive bowel sounds, soft nontender to palpation, nondistended Extremities: No clubbing, cyanosis, or edema Skin: No evidence of breakdown, no evidence of rash Neurologic: Cranial nerves II through XII intact, motor strength is 5/5 in lef tand 3- R deltoid, bicep, tricep, grip,5/5 left and 2- right  hip flexor, knee extensors,  0/5 on right 5/5 left ankle dorsiflexor and plantar flexor Sensory exam normal sensation to light touch  in bilateral upper and lower extremities Intact finger to thumb opposition Musculoskeletal: Full range of motion in all 4 extremities. No joint swelling   Assessment/Plan: 1. Functional deficits which require 3+ hours per day of interdisciplinary therapy in a comprehensive inpatient rehab setting. Physiatrist is providing close team  supervision and 24 hour management of active medical problems listed below. Physiatrist and rehab team continue to assess barriers to discharge/monitor patient progress toward functional and medical goals  Care Tool:  Bathing    Body parts bathed by patient: Right arm, Chest, Abdomen, Front perineal area, Right upper leg, Buttocks, Left upper leg, Right lower leg, Left lower leg, Face   Body parts bathed by helper: Left arm     Bathing assist Assist Level: Minimal Assistance - Patient > 75%     Upper Body Dressing/Undressing Upper body dressing   What is the patient wearing?: Pull over shirt    Upper body assist Assist Level: Maximal Assistance - Patient 25 - 49%    Lower Body Dressing/Undressing Lower body dressing      What is the patient wearing?: Pants     Lower body assist Assist for lower body dressing: Maximal Assistance - Patient 25 - 49%     Toileting Toileting    Toileting assist Assist for toileting: Moderate Assistance - Patient 50 - 74%     Transfers Chair/bed transfer  Transfers assist     Chair/bed transfer assist level: Minimal Assistance - Patient > 75%  Locomotion Ambulation   Ambulation assist      Assist level: 2 helpers Assistive device:  (B HHA) Max distance: 8 feet   Walk 10 feet activity   Assist     Assist level: 2 helpers     Walk 50 feet activity   Assist Walk 50 feet with 2 turns activity did not occur: Safety/medical concerns (fatigue)         Walk 150 feet activity   Assist Walk 150 feet activity did not occur: Safety/medical concerns (fatigue)         Walk 10 feet on uneven surface  activity   Assist Walk 10 feet on uneven surfaces activity did not occur: Safety/medical concerns         Wheelchair     Assist Is the patient using a wheelchair?: Yes Type of Wheelchair: Manual    Wheelchair assist level: Dependent - Patient 0%      Wheelchair 50 feet with 2 turns  activity    Assist        Assist Level: Dependent - Patient 0%   Wheelchair 150 feet activity     Assist      Assist Level: Dependent - Patient 0%   Blood pressure 137/82, pulse (!) 56, temperature 98.7 F (37.1 C), temperature source Oral, resp. rate 17, height 5' 8 (1.727 m), weight 60.5 kg, SpO2 98%.  Medical Problem List and Plan: 1. Functional deficits secondary to left basal ganglia hemorrhage d/t hypertension and anticoagulation  with subsequent right hemiparesis             -patient may  shower             -ELOS/Goals: 12-16 days, goals mod I with PT and OT and independent with SLP              -PRAFO while in bed to stretch right heel cord. I suspect he will need an AFO for gait. 2.  Antithrombotics: -DVT/anticoagulation:  Pharmaceutical: Heparin              -antiplatelet therapy: none   3. Pain Management: Tylenol  as needed   4. Mood/Behavior/Sleep: LCSW to evaluate and provide emotional support             -antipsychotic agents: n/a   5. Neuropsych/cognition: This patient is capable of making decisions on his own behalf although he appears to have some short term memory deficits   6. Skin/Wound Care: Routine skin care checks   7. Fluids/Electrolytes/Nutrition: Routine Is and Os and follow-up chemistries   8: Hypertension: monitor TID and prn. Fair control at present             -Continue amlodipine  2.5 mg daily             -Continue metoprolol  tartrate 50 mg twice daily   Vitals:   08/15/23 1939 08/16/23 0410  BP: (!) 152/74 137/82  Pulse: (!) 54 (!) 56  Resp: 17 17  Temp: (!) 97.3 F (36.3 C) 98.7 F (37.1 C)  SpO2: 97% 98%   Sys BP improved, remains bradycardic on beta blocker  9: Hyperlipidemia: continue Lipitor  80 mg daily   10: Hypothyroidism: Continue Synthroid  125 mcg every morning   11: Tobacco use: Cessation counseling   12: Atrial fibrillation: Now off Eliquis  due to ICH.  Will follow-up with neurology in 2 weeks to consider  aspire trial, restart Eliquis  in 2 weeks if stable and follow-up with cardiology.   13: Chronic leukopenia: Follow-up CBC  14: Mild thrombocytopenia: Follow-up CBC      LOS: 2 days A FACE TO FACE EVALUATION WAS PERFORMED  Adrian [name] 08/16/2023, 8:10 AM

## 2023-08-16 NOTE — Progress Notes (Signed)
 Physical Therapy Session Note  Patient Details  Name: Adrian [name] MRN: [id number] Date of Birth: 30-Jan-1956  Today's Date: 08/16/2023 PT Individual Time: 1052-1203; 1355 - 1437 PT Individual Time Calculation (min): 71 min; 42 min   Short Term Goals: Week 1:  PT Short Term Goal 1 (Week 1): pt will perform sit to stand with LRAD and supervision PT Short Term Goal 2 (Week 1): pt will perform bed to chair transfer with LRAD and supervision PT Short Term Goal 3 (Week 1): pt will ambulate 50 feet with LRAD and min A  SESSION 1 Skilled Therapeutic Interventions/Progress Updates: Patient supine in bed on entrance to room. Patient alert and agreeable to PT session.   Patient reported no pain during session.  Therapeutic Activity: Bed Mobility: Pt performed supine<>sit on EOB with close supervision (HOB elevated and use of bed features). Pt advanced R LE off of bed with assistance from L UE (R LE assisted when transitioning sitting EOB to supine at end of session via hook method).  Transfers: Pt performed stand pivot transfers throughout session with no AD and with minA. Provided to increase step clearance on R LE, and to pivot steps accordingly. Pt with decreased DF and eversion  Patient demonstrates increased fall risk as noted by score of 13/56 on Berg Balance Scale.  (<36= high risk for falls, close to 100%; 37-45 significant >80%; 46-51 moderate >50%; 52-55 lower >25%)  5xSTS (avg = 21.23s) from hi/low mat lowered. Supervision/light minA throughout due to pt inability to maintain stabilize standing balance, leading to posterior LOB.   - 17.6s with no UE support (light minA due to decreased anterior lean - no VC provided)  - 21.64s with minimal UE support, VC for anterior lean to avoid posterior LOB  - 24.56s - did not count 2 due to pt posterior LOB from not stabilizing in standing  Neuromuscular Re-ed: NMR facilitated during session with focus on neuromuscular control, dynamic standing  balance, coordination. - Pt performed x 4 transfers from edge of mat <> WC with DF wrap donned (improved step length/clearance vs earlier presentation in room). Pt with VC for weight shifting accordingly, advancement of B LE accordingly and to reach back to surface for controlled descent. Pt with minA throughout. Pt with hip flexion compensation to retro-step R LE when pivoting back to sitting surface.  - Pt performed R hip flexion while short sitting edge of mat with 4lb ankle weight donned on R ankle. PTA passively moved R LE into hip flexion with VC for pt to control eccentric while avoiding valsalva maneuver. Pt performed until fatigue - R LAQ with 4 lb ankle weight donned on R LE with VC to control eccentric. Pt performed until close to fatigue.   NMR performed for improvements in motor control and coordination, balance, sequencing, judgement, and self confidence/ efficacy in performing all aspects of mobility at highest level of independence.   Patient supine in bed at end of session with brakes locked, bed alarm set, and all needs within reach.  SESSION 2 Skilled Therapeutic Interventions/Progress Updates: Patient supine in bed on entrance to room. Patient alert and agreeable to PT session.   Patient reported no pain or change in status since previous session.  Therapeutic Activity: Bed Mobility: Pt performed supine<>sit on EOB with supervision and use of bed features. Transfers: Pt performed sit<>stand transfers throughout session with RW and with minA. Provided VC for sequence of B UE/LE's, increasing step length of R LE and bringing awareness to  R LE to avoid rolling ankle due to decrease strength in dorsiflexors/evertors   Gait Training:  Pt ambulated 40' x 1, and 53' x 1 using RW with minA with WC follow for safety (DF wrap donned). Pt demonstrated the following gait deviations with therapist providing the described cuing and facilitation for improvement:  - Decreased step clearance  bilaterally. Decreased cadence. - decreased BOS with VC to increase step width on R - Excessive external rotation on R LE with VC for pt to bring R toe towards midline (while stepping slightly out for increased BOS). Pt required increased stepping attempts to perform, but was able to do so with less VC, and pt demonstrating self-error learning (able to increase cadence slightly towards end of last trial) - R UE slipped off of RW three times with pt aware of it, and placing it back on RW.   Patient supine in bed at end of session with brakes locked, bed alarm set, and all needs within reach.       Therapy Documentation Precautions:  Precautions Precautions: Fall Precaution Comments: R hemi Restrictions Weight Bearing Restrictions Per Provider Order: No  Therapy/Group: Individual Therapy  Naythan Douthit PTA 08/16/2023, 12:08 PM

## 2023-08-16 NOTE — Plan of Care (Signed)
  Problem: Consults Goal: RH STROKE PATIENT EDUCATION Description: See Patient Education module for education specifics  Outcome: Progressing   Problem: RH BOWEL ELIMINATION Goal: RH STG MANAGE BOWEL WITH ASSISTANCE Description: STG Manage Bowel with mod I Assistance. Outcome: Progressing   Problem: RH SAFETY Goal: RH STG ADHERE TO SAFETY PRECAUTIONS W/ASSISTANCE/DEVICE Description: STG Adhere to Safety Precautions With cues Outcome: Progressing   Problem: RH KNOWLEDGE DEFICIT Goal: RH STG INCREASE KNOWLEDGE OF DIABETES Outcome: Progressing Goal: RH STG INCREASE KNOWLEDGE OF HYPERTENSION Description: Patient manage care re: hypertension at discharge using educational materials independently Outcome: Progressing Goal: RH STG INCREASE KNOWLEGDE OF HYPERLIPIDEMIA Outcome: Progressing Goal: RH STG INCREASE KNOWLEDGE OF STROKE PROPHYLAXIS Outcome: Progressing

## 2023-08-17 DIAGNOSIS — I10 Essential (primary) hypertension: Secondary | ICD-10-CM | POA: Diagnosis not present

## 2023-08-17 DIAGNOSIS — I610 Nontraumatic intracerebral hemorrhage in hemisphere, subcortical: Secondary | ICD-10-CM | POA: Diagnosis not present

## 2023-08-17 DIAGNOSIS — Z720 Tobacco use: Secondary | ICD-10-CM | POA: Diagnosis not present

## 2023-08-17 MED ORDER — METOPROLOL TARTRATE 25 MG PO TABS
25.0000 mg | ORAL_TABLET | Freq: Two times a day (BID) | ORAL | Status: DC
  Administered 2023-08-17 – 2023-08-20 (×5): 25 mg via ORAL
  Filled 2023-08-17 (×6): qty 1

## 2023-08-17 NOTE — Progress Notes (Addendum)
 Hold metoprolol per Fermin Schwab. PA.    Tilden Dome, LPN  Adjusted metoprolol dosing to 25 mg BID.

## 2023-08-17 NOTE — Progress Notes (Signed)
 Speech Language Pathology Daily Session Note  Patient Details  Name: HELMUTH RECUPERO MRN: [id number] Date of Birth: [birth year]  Today's Date: 08/17/2023 SLP Individual Time: 1400-1445 SLP Individual Time Calculation (min): 45 min  Short Term Goals: Week 1: SLP Short Term Goal 1 (Week 1): Patient will participate in MBS to assess oropharyngeal swallow function SLP Short Term Goal 2 (Week 1): Patient will increase speech intelligibility to 90% during conversation with use of strategies given min multimodal A SLP Short Term Goal 3 (Week 1): Patient will utilize compensatory strategies to increase word finding during abstract thoughts given min multimodal A SLP Short Term Goal 4 (Week 1): Patient will demonstrate problem solving during mildly complex functional situations given min multimodal A SLP Short Term Goal 5 (Week 1): Patient will recall and utilize memory compensatory aids given min multimodal A  Skilled Therapeutic Interventions: Skilled therapy session focused on communication goals. Initially, SLP re-educated patient on swallowing safety strategies discussed yesterday afternoon post-MBS. Patient verbalized understanding. SLP then targeted word finding through description task. Patient was tasked to describe vocabulary words utilizing semantic feature anaylsis. Patient completed activity with minA. Patient left in bed with alarm set and call bell in reach. Continue POC.   Pain None reported   Therapy/Group: Individual Therapy  Terrie Grajales M.A., CF-SLP 08/17/2023, 7:38 AM

## 2023-08-17 NOTE — IPOC Note (Signed)
 Overall Plan of Care [facility]) Patient Details Name: Adrian [name] MRN: [id number] DOB: [birth year]  Admitting Diagnosis: ICH (intracerebral hemorrhage) [facility])  Hospital Problems: Principal Problem:   ICH (intracerebral hemorrhage) (HCC)     Functional Problem List: Nursing Medication Management, Safety, Bowel, Endurance  PT Balance, Endurance, Motor, Pain, Perception, Safety, Sensory, Skin Integrity  OT Balance, Pain, Cognition, Safety, Perception, Sensory, Endurance, Motor  SLP Cognition, Linguistic, Nutrition  TR         Basic ADL's: OT Eating, Grooming, Bathing, Dressing, Toileting     Advanced  ADL's: OT       Transfers: PT Bed Mobility, Bed to Chair, Car, Occupational Psychologist, Research Scientist (life Sciences): PT Ambulation, Psychologist, Prison And Probation Services, Stairs     Additional Impairments: OT Fuctional Use of Upper Extremity  SLP Swallowing, Communication, Social Cognition expression Problem Solving, Memory  TR      Anticipated Outcomes Item Anticipated Outcome  Self Feeding supervision with domiant hand  Swallowing  modi   Basic self-care  supervision  Toileting  supervision   Bathroom Transfers supervision  Bowel/Bladder  manage bowels with mod I  Transfers  mod I  Locomotion  supervision  Communication  supervision  Cognition  supervision  Pain  pain less than 4 with prn meds  Safety/Judgment  manage safety with cues   Therapy Plan: PT Intensity: Minimum of 1-2 x/day ,45 to 90 minutes PT Frequency: 5 out of 7 days PT Duration Estimated Length of Stay: 12-16 days OT Intensity: Minimum of 1-2 x/day, 45 to 90 minutes OT Frequency: 5 out of 7 days OT Duration/Estimated Length of Stay: ~ 12-14 days SLP Intensity: Minumum of 1-2 x/day, 30 to 90 minutes SLP Frequency: 3 to 5 out of 7 days SLP Duration/Estimated Length of Stay: 2 weeks   Team Interventions: Nursing Interventions Patient/Family Education, Disease Management/Prevention, Discharge Planning, Bowel  Management, Medication Management  PT interventions Ambulation/gait training, Discharge planning, Functional mobility training, Psychosocial support, Therapeutic Activities, Visual/perceptual remediation/compensation, Balance/vestibular training, Disease management/prevention, Neuromuscular re-education, Skin care/wound management, Therapeutic Exercise, Wheelchair propulsion/positioning, Cognitive remediation/compensation, DME/adaptive equipment instruction, Pain management, Splinting/orthotics, UE/LE Strength taining/ROM, Community reintegration, Development worker, international aid stimulation, Equities Trader education, Museum/gallery curator, UE/LE Coordination activities  OT Interventions Warden/ranger, Community reintegration, Disease mangement/prevention, Development worker, international aid stimulation, Neuromuscular re-education, Patient/family education, Self Care/advanced ADL retraining, Therapeutic Exercise, UE/LE Coordination activities, Cognitive remediation/compensation, Discharge planning, DME/adaptive equipment instruction, Functional mobility training, Pain management, Psychosocial support, Skin care/wound managment, Therapeutic Activities, UE/LE Strength taining/ROM  SLP Interventions Cognitive remediation/compensation, Dysphagia/aspiration precaution training, Cueing hierarchy, Functional tasks, Internal/external aids, Multimodal communication approach, Patient/family education, Speech/Language facilitation, Therapeutic Activities, Therapeutic Exercise  TR Interventions    SW/CM Interventions Discharge Planning, Psychosocial Support, Patient/Family Education   Barriers to Discharge MD  Medical stability and Lack of/limited family support  Nursing Decreased caregiver support 1 level 2 step entry rt rail / solo  PT Inaccessible home environment, Decreased caregiver support, Home environment access/layout, Lack of/limited family support pain  OT      SLP      SW Decreased caregiver support, Lack of/limited  family support, Community Education Officer for SNF coverage     Team Discharge Planning: Destination: PT-Home ,OT- Home , SLP-Home Projected Follow-up: PT-Home health PT, OT-  Outpatient OT, SLP-Outpatient SLP Projected Equipment Needs: PT-To be determined, OT- To be determined, SLP-None recommended by SLP Equipment Details: PT-pt does not have any equipment, OT-  Patient/family involved in discharge planning: PT- Patient,  OT-Patient, SLP-Patient  MD ELOS: 10-14d Medical Rehab Prognosis:  Good Assessment: The patient has been admitted for CIR therapies with the diagnosis of CVA. The team will be addressing functional mobility, strength, stamina, balance, safety, adaptive techniques and equipment, self-care, bowel and bladder mgt, patient and caregiver education, BP management . Goals have been set at Sup. Anticipated discharge destination is Home with ex wife assist.        See Team Conference Notes for weekly updates to the plan of care

## 2023-08-17 NOTE — Progress Notes (Signed)
 Physical Therapy Session Note  Patient Details  Name: Adrian [name] MRN: [id number] Date of Birth: [birth year]  Today's Date: 08/17/2023 PT Individual Time: 1308-1350 PT Individual Time Calculation (min): 42 min   Short Term Goals: Week 1:  PT Short Term Goal 1 (Week 1): pt will perform sit to stand with LRAD and supervision PT Short Term Goal 2 (Week 1): pt will perform bed to chair transfer with LRAD and supervision PT Short Term Goal 3 (Week 1): pt will ambulate 50 feet with LRAD and min A  Skilled Therapeutic Interventions/Progress Updates: Patient supine in bed on entrance to room. Patient alert and agreeable to PT session.   Patient reported no pain during session.  Therapeutic Activity: Bed Mobility: Pt performed supine<>sit on EOB with supervision (HOB slightly elevated). Transfers: Pt performed sit<>stand transfers throughout session with RW and with pt recalling safety cues of R LE to avoid ankle rolling. Provided VC for hand placement.   - Pt with improved transfers following e-stim by demonstrating increase in active dorsiflexion and eversion to avoid ankle rolling. This did not last too long after as pt was unable to perform same motion while supine in bed at end of session.   Neuromuscular Re-ed: Applied R LE (TA/evertors) [place] e-stim device set on NMES L  - intensity 42 with palpable muscle contraction - after completion of e-stim, pt denies any negative side effects with skin intact and no adverse side effects noted. Pt provided with VC to look at ankle as it moves, and to actively think about moving ankle into DF and eversion with each rise.   NMR performed for improvements in motor control and coordination, balance, sequencing, judgement, and self confidence/ efficacy in performing all aspects of mobility at highest level of independence.   Patient supine at end of session with brakes locked, bed alarm set, and all needs within reach.      Therapy  Documentation Precautions:  Precautions Precautions: Fall Precaution Comments: R hemi Restrictions Weight Bearing Restrictions Per Provider Order: No  Therapy/Group: Individual Therapy  Kennet Mccort PTA 08/17/2023, 3:13 PM

## 2023-08-17 NOTE — Plan of Care (Signed)
  Problem: Consults Goal: RH STROKE PATIENT EDUCATION Description: See Patient Education module for education specifics  Outcome: Progressing   Problem: RH BOWEL ELIMINATION Goal: RH STG MANAGE BOWEL WITH ASSISTANCE Description: STG Manage Bowel with mod I Assistance. Outcome: Progressing   Problem: RH SAFETY Goal: RH STG ADHERE TO SAFETY PRECAUTIONS W/ASSISTANCE/DEVICE Description: STG Adhere to Safety Precautions With cues Outcome: Progressing   Problem: RH KNOWLEDGE DEFICIT Goal: RH STG INCREASE KNOWLEDGE OF DIABETES Outcome: Progressing Goal: RH STG INCREASE KNOWLEDGE OF HYPERTENSION Description: Patient manage care re: hypertension at discharge using educational materials independently Outcome: Progressing Goal: RH STG INCREASE KNOWLEGDE OF HYPERLIPIDEMIA Outcome: Progressing Goal: RH STG INCREASE KNOWLEDGE OF STROKE PROPHYLAXIS Outcome: Progressing

## 2023-08-17 NOTE — Progress Notes (Signed)
 PROGRESS NOTE   Subjective/Complaints:  Appreciate MBS, occ cough with meals but no aspiration seen No fever or chills   ROS- neg CP, SOB, N/V/D, no abd pain or bladder issues   Objective:   DG Swallowing Func-Speech Pathology Result Date: 08/16/2023 Table formatting from the original result was not included. Modified Barium Swallow Study Patient Details Name: Adrian [name] MRN: [id number] Date of Birth: [birth year] Today's Date: 08/16/2023 HPI/PMH: HPI: Adrian [name] is a 68 yo male presenting to ED 1/4 with sudden onset R sided weakness. CTH includes ICH centered in the L basal ganglia with intraventricular extension. Pt seen August-October 2020 for dysphagia and cognitive by SLP targeting executive functioning, memory, attention, aphasia, and anomia.  PMH includes A-fib, carotid artery stenosis, hypothyroidism, HLD Clinical Impression: Patient presents with moderate oropharyngeal dysphagia. Oral phase is characterized by reduced lingual control/coordination resulting in posterior spillage to the pyriforms and trace-mild oral residuals throughout the oral cavity. Pharyngeal phase is remarkable for incomplete epiglottic inversion, reduced laryngeal vestibular closure, diminished pharyngeal stripping wave, reduced hyoid excursion reduced tongue base retraction, and marked obstruction of PES opening. These deficits resulted in audible aspiration of thin liquids during consumption of pill (no other aspiration observed during thin liquids, occasional penetration) and penetration of NTL to vocal cords, though spontaneously cleared. Use of chin tuck and head turn to reduce penetration/aspiration was ineffective. No penetration/aspiration present during puree and solids. Moderate-severe pharyngeal residuals present throughout the pharyngeal cavity and worsened as viscosity increased. Residuals were effectively reduced with use of multiple swallows. Recommend  continuation of current diet (regular/thin) with multiple dry swallows after solids. Medications should be consumed crushed in puree, as patient unable to swallow pill during study and concern for lodging in vallecula. Patient should consume single, small sips of thin liquids with occasional throat clear. Recommend intermittent supervision to cue for compensatory strategies and initiation of pharyngeal strengthening exercises. Factors that may increase risk of adverse event in presence of aspiration Noe & Lianne 2021): No data recorded Recommendations/Plan: Swallowing Evaluation Recommendations Swallowing Evaluation Recommendations Recommendations: PO diet PO Diet Recommendation: Regular; Thin liquids (Level 0) Liquid Administration via: Cup; Straw Medication Administration: Crushed with puree Supervision: Patient able to self-feed; Staff to assist with self-feeding; Intermittent supervision/cueing for swallowing strategies Swallowing strategies  : Minimize environmental distractions; Slow rate; Small bites/sips; Multiple dry swallows after each bite/sip; Clear throat intermittently Postural changes: Position pt fully upright for meals Oral care recommendations: Oral care BID (2x/day) Treatment Plan Treatment Plan Treatment recommendations: Therapy as outlined in treatment plan below Follow-up recommendations: Outpatient SLP Recommendations Comment: OP ST Functional status assessment: Patient has had a recent decline in their functional status and demonstrates the ability to make significant improvements in function in a reasonable and predictable amount of time. Treatment frequency: Min 2x/week Treatment duration: 2 weeks Interventions: Aspiration precaution training; Oropharyngeal exercises; Compensatory techniques; Patient/family education Recommendations Recommendations for follow up therapy are one component of a multi-disciplinary discharge planning process, led by the attending physician.   Recommendations may be updated based on patient status, additional functional criteria and insurance authorization. Assessment: Orofacial Exam: Orofacial Exam Oral Cavity: Oral Hygiene: WFL Oral Cavity - Dentition: Adequate natural  dentition Orofacial Anatomy: WFL Oral Motor/Sensory Function: WFL Anatomy: Anatomy: Presence of cervical hardware; Prominent cricopharyngeus Boluses Administered: Boluses Administered Boluses Administered: Thin liquids (Level 0); Mildly thick liquids (Level 2, nectar thick); Puree; Solid  Oral Impairment Domain: Oral Impairment Domain Lip Closure: No labial escape Tongue control during bolus hold: Cohesive bolus between tongue to palatal seal Bolus preparation/mastication: Timely and efficient chewing and mashing Bolus transport/lingual motion: Repetitive/disorganized tongue motion Oral residue: Trace residue lining oral structures Location of oral residue : Tongue; Palate Initiation of pharyngeal swallow : Pyriform sinuses  Pharyngeal Impairment Domain: Pharyngeal Impairment Domain Soft palate elevation: No bolus between soft palate (SP)/pharyngeal wall (PW) Laryngeal elevation: Complete superior movement of thyroid  cartilage with complete approximation of arytenoids to epiglottic petiole Anterior hyoid excursion: Partial anterior movement Epiglottic movement: Partial inversion Laryngeal vestibule closure: Incomplete, narrow column air/contrast in laryngeal vestibule Pharyngeal stripping wave : Present - diminished Pharyngeal contraction (A/P view only): N/A Pharyngoesophageal segment opening: Minimal distention/minimal duration, marked obstruction of flow Tongue base retraction: Narrow column of contrast or air between tongue base and PPW Pharyngeal residue: Collection of residue within or on pharyngeal structures; Majority of contrast within or on pharyngeal structures Location of pharyngeal residue: Diffuse (>3 areas)  Esophageal Impairment Domain: No data recorded Pill: Pill  Consistency administered: Thin liquids (Level 0) Thin liquids (Level 0): Impaired (see clinical impressions) Penetration/Aspiration Scale Score: Penetration/Aspiration Scale Score 1.  Material does not enter airway: Solid; Pill; Puree 4.  Material enters airway, CONTACTS cords then ejected out: Mildly thick liquids (Level 2, nectar thick) 7.  Material enters airway, passes BELOW cords and not ejected out despite cough attempt by patient: Thin liquids (Level 0) Compensatory Strategies: Compensatory Strategies Compensatory strategies: Yes Multiple swallows: Effective Effective Multiple Swallows: Mildly thick liquid (Level 2, nectar thick); Puree; Solid Chin tuck: Ineffective Ineffective Chin Tuck: Thin liquid (Level 0) Left head turn: Ineffective Ineffective Left Head Turn: Thin liquid (Level 0) Right head turn: Ineffective Ineffective Right Head Turn: Thin liquid (Level 0)   General Information: No data recorded Diet Prior to this Study: Regular; Thin liquids (Level 0)   No data recorded  Respiratory Status: WFL   Supplemental O2: None (Room air)   No data recorded Behavior/Cognition: Alert; Cooperative Self-Feeding Abilities: Able to self-feed Baseline vocal quality/speech: Normal Volitional Cough: Able to elicit Volitional Swallow: Able to elicit No data recorded Goal Planning: Prognosis for improved oropharyngeal function: Good No data recorded Barriers/Prognosis Comment: n/a Patient/Family Stated Goal: none stated Consulted and agree with results and recommendations: Patient Pain: No data recorded End of Session: Start Time:No data recorded Stop Time: No data recorded Time Calculation:No data recorded Charges: No data recorded SLP visit diagnosis: SLP Visit Diagnosis: Dysphagia, oropharyngeal phase (R13.12) Past Medical History: Past Medical History: Diagnosis Date  AF (paroxysmal atrial fibrillation) (HCC) 11/29/2018  Atrial flutter (HCC) 10/12/2015  a. TEE 3/17 with ? LAA clot-->s/p TEE/DCCV 11/24/2015; s/p DCCV  12/2022;s/p ablation 06/2023  CAD (coronary artery disease), native coronary artery 12/06/2015  a. 10/2015 MV: EF  37%, reversible defect inferior apex, intermediate risk findings; b. 10/2015 Cath: 20% mid RCA.  Carotid artery stenosis   1-39% right ICA stenosis and occluded left ICA  Colonic diverticular abscess   Diverticulitis   History of chemotherapy 2005  Cisplatin  Hypothyroidism   NICM (nonischemic cardiomyopathy) (HCC) 10/14/2015  a. Tachy mediated?;  b. Echo 3/17 - Mild concentric LVH, EF 30-35%, anteroseptal, anterior, anterolateral, apical anterior, lateral hypokinesis, trivial MR, mild to moderately reduced RVSF; c.  LHC 3/17 - mRCA 20%  Radiation NOv.3,2005-Dec. 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  XRT Past Surgical History: Past Surgical History: Procedure Laterality Date  A-FLUTTER ABLATION N/A 06/19/2023  Procedure: A-FLUTTER ABLATION;  Surgeon: Cindie Ole DASEN, MD;  Location: [facility] INVASIVE CV LAB;  Service: Cardiovascular;  Laterality: N/A;  APPENDECTOMY N/A 03/07/2019  Procedure: ROBOTIC ASSISTED APPENDECTOMY;  Surgeon: Sheldon Standing, MD;  Location: [facility] ORS;  Service: General;  Laterality: N/A;  CARDIAC CATHETERIZATION N/A 10/18/2015  Procedure: Left Heart Cath and Coronary Angiography;  Surgeon: Lonni JONETTA Cash, MD;  Location: [facility] INVASIVE CV LAB;  Service: Cardiovascular;  Laterality: N/A;  CARDIOVERSION N/A 11/24/2015  Procedure: CARDIOVERSION;  Surgeon: Maude JAYSON Emmer, MD;  Location: [facility] ENDOSCOPY;  Service: Cardiovascular;  Laterality: N/A;  CARDIOVERSION N/A 12/20/2022  Procedure: CARDIOVERSION;  Surgeon: Alvan Ronal BRAVO, MD;  Location: [facility] INVASIVE CV LAB;  Service: Cardiovascular;  Laterality: N/A;  CYSTOSCOPY WITH STENT PLACEMENT Bilateral 03/07/2019  Procedure: CYSTOSCOPY WITH BILATERAL FIREFLY INJECTION;  Surgeon: Cam Morene ORN, MD;  Location: [facility] ORS;  Service: Urology;  Laterality: Bilateral;  GASTROSTOMY TUBE PLACEMENT  07/04/2004  IR - G tube  for tonsilar cancer  IR ANGIO INTRA EXTRACRAN SEL COM CAROTID INNOMINATE UNI R MOD SED  03/09/2019  IR ANGIO VERTEBRAL SEL VERTEBRAL UNI R MOD SED  03/09/2019  IR CT HEAD LTD  03/09/2019  IR PERCUTANEOUS ART THROMBECTOMY/INFUSION INTRACRANIAL INC DIAG ANGIO  03/09/2019  IR RADIOLOGIST EVAL & MGMT  12/18/2018  LAMINECTOMY    C5/placement of steel plate  LOOP RECORDER INSERTION N/A 06/19/2023  Procedure: LOOP RECORDER INSERTION;  Surgeon: Cindie Ole DASEN, MD;  Location: [facility] INVASIVE CV LAB;  Service: Cardiovascular;  Laterality: N/A;  NECK SURGERY  2003  replaced disk  RADIOLOGY WITH ANESTHESIA N/A 03/08/2019  Procedure: IR WITH ANESTHESIA;  Surgeon: Dolphus Carrion, MD;  Location: [facility] OR;  Service: Radiology;  Laterality: N/A;  TEE WITHOUT CARDIOVERSION N/A 10/15/2015  Procedure: TRANSESOPHAGEAL ECHOCARDIOGRAM (TEE);  Surgeon: Ezra GORMAN Shuck, MD;  Location: [facility] ENDOSCOPY;  Service: Cardiovascular;  Laterality: N/A;  TEE WITHOUT CARDIOVERSION N/A 11/24/2015  Procedure: TRANSESOPHAGEAL ECHOCARDIOGRAM (TEE);  Surgeon: Maude JAYSON Emmer, MD;  Location: [facility] ENDOSCOPY;  Service: Cardiovascular;  Laterality: N/A;  TEE WITHOUT CARDIOVERSION N/A 12/20/2022  Procedure: TRANSESOPHAGEAL ECHOCARDIOGRAM;  Surgeon: Alvan Ronal BRAVO, MD;  Location: [facility] INVASIVE CV LAB;  Service: Cardiovascular;  Laterality: N/A;  XI ROBOTIC ASSISTED COLOSTOMY TAKEDOWN N/A 03/07/2019  Procedure: XI ROBOTIC ASSISTED LOW ANTERIOR RESECTION, RIGID PROCTOSCOPY;  Surgeon: Sheldon Standing, MD;  Location: [facility] ORS;  Service: General;  Laterality: N/A; Cassidi F Sockwell 08/16/2023, 4:28 PM  Recent Labs    08/15/23 0508  WBC 2.7*  HGB 15.5  HCT 44.3  PLT 126*   Recent Labs    08/15/23 0508  NA 133*  K 4.2  CL 100  CO2 25  GLUCOSE 105*  BUN 18  CREATININE 0.88  CALCIUM  9.1    Intake/Output Summary (Last 24 hours) at 08/17/2023 0714 Last data filed at 08/17/2023 0430 Gross per 24 hour  Intake 472 ml  Output 400 ml  Net 72 ml        Physical Exam: Vital  Signs Blood pressure (!) 150/85, pulse (!) 53, temperature 97.6 F (36.4 C), resp. rate 18, height 5' 8 (1.727 m), weight 60.5 kg, SpO2 99%.   General: No acute distress Mood and affect are appropriate Heart: Regular rate and rhythm no rubs murmurs or extra  sounds Lungs: Clear to auscultation, breathing unlabored, no rales or wheezes Abdomen: Positive bowel sounds, soft nontender to palpation, nondistended Extremities: No clubbing, cyanosis, or edema Skin: No evidence of breakdown, no evidence of rash Neurologic: Cranial nerves II through XII intact, motor strength is 5/5 in left and 3- R deltoid, bicep, tricep, grip,5/5 left and 3- right  hip flexor, knee extensors,  1/5 on right 5/5 left ankle dorsiflexor and plantar flexor Sensory exam normal sensation to light touch  in bilateral upper and lower extremities Intact finger to thumb opposition but slow on Right side  Musculoskeletal: Full range of motion in all 4 extremities. No joint swelling   Assessment/Plan: 1. Functional deficits which require 3+ hours per day of interdisciplinary therapy in a comprehensive inpatient rehab setting. Physiatrist is providing close team supervision and 24 hour management of active medical problems listed below. Physiatrist and rehab team continue to assess barriers to discharge/monitor patient progress toward functional and medical goals  Care Tool:  Bathing    Body parts bathed by patient: Right arm, Chest, Abdomen, Front perineal area, Right upper leg, Buttocks, Left upper leg, Right lower leg, Left lower leg, Face   Body parts bathed by helper: Left arm     Bathing assist Assist Level: Minimal Assistance - Patient > 75%     Upper Body Dressing/Undressing Upper body dressing   What is the patient wearing?: Pull over shirt    Upper body assist Assist Level: Maximal Assistance - Patient 25 - 49%    Lower Body Dressing/Undressing Lower body dressing      What is the patient wearing?:  Pants     Lower body assist Assist for lower body dressing: Maximal Assistance - Patient 25 - 49%     Toileting Toileting    Toileting assist Assist for toileting: Moderate Assistance - Patient 50 - 74%     Transfers Chair/bed transfer  Transfers assist     Chair/bed transfer assist level: Minimal Assistance - Patient > 75%     Locomotion Ambulation   Ambulation assist      Assist level: 2 helpers Assistive device:  (B HHA) Max distance: 8 feet   Walk 10 feet activity   Assist     Assist level: 2 helpers     Walk 50 feet activity   Assist Walk 50 feet with 2 turns activity did not occur: Safety/medical concerns (fatigue)         Walk 150 feet activity   Assist Walk 150 feet activity did not occur: Safety/medical concerns (fatigue)         Walk 10 feet on uneven surface  activity   Assist Walk 10 feet on uneven surfaces activity did not occur: Safety/medical concerns         Wheelchair     Assist Is the patient using a wheelchair?: Yes Type of Wheelchair: Manual    Wheelchair assist level: Dependent - Patient 0%      Wheelchair 50 feet with 2 turns activity    Assist        Assist Level: Dependent - Patient 0%   Wheelchair 150 feet activity     Assist      Assist Level: Dependent - Patient 0%   Blood pressure (!) 150/85, pulse (!) 53, temperature 97.6 F (36.4 C), resp. rate 18, height 5' 8 (1.727 m), weight 60.5 kg, SpO2 99%.  Medical Problem List and Plan: 1. Functional deficits secondary to left basal ganglia hemorrhage d/t hypertension and anticoagulation  with subsequent right hemiparesis             -patient may  shower             -ELOS/Goals: 12-16 days, goals mod I with PT and OT and independent with SLP              -PRAFO while in bed to stretch right heel cord. I suspect he will need an AFO for gait. 2.  Antithrombotics: -DVT/anticoagulation:  Pharmaceutical: Heparin               -antiplatelet therapy: none   3. Pain Management: Tylenol  as needed   4. Mood/Behavior/Sleep: LCSW to evaluate and provide emotional support             -antipsychotic agents: n/a   5. Neuropsych/cognition: This patient is capable of making decisions on his own behalf although he appears to have some short term memory deficits   6. Skin/Wound Care: Routine skin care checks   7. Fluids/Electrolytes/Nutrition: Routine Is and Os and follow-up chemistries   8: Hypertension: monitor TID and prn. Fair control at present             -Continue amlodipine  2.5 mg daily             -Continue metoprolol  tartrate 50 mg twice daily   Vitals:   08/16/23 1943 08/17/23 0420  BP: (!) 147/78 (!) 150/85  Pulse: 64 (!) 53  Resp: 17 18  Temp: 98.8 F (37.1 C) 97.6 F (36.4 C)  SpO2: 99% 99%   Sys BP improved, remains bradycardic on beta blocker  9: Hyperlipidemia: continue Lipitor  80 mg daily   10: Hypothyroidism: Continue Synthroid  125 mcg every morning   11: Tobacco use: Cessation counseling   12: Atrial fibrillation: Now off Eliquis  due to ICH.  Will follow-up with neurology in 2 weeks to consider aspire trial, restart Eliquis  in 2 weeks if stable and follow-up with cardiology.   13: Chronic leukopenia: Follow-up CBC   14: Mild thrombocytopenia: Follow-up CBC      LOS: 3 days A FACE TO FACE EVALUATION WAS PERFORMED  Prentice FORBES Compton 08/17/2023, 7:14 AM

## 2023-08-17 NOTE — Progress Notes (Signed)
 Occupational Therapy Session Note  Patient Details  Name: [name] MRN: [id number] Date of Birth: 26-Mar-1956  Today's Date: 08/17/2023 OT Individual Time: 0830-0930 OT Individual Time Calculation (min): 60 min    Short Term Goals: Week 1:  OT Short Term Goal 1 (Week 1): Pt will be able to pick up dinger foods to feed self with min A OT Short Term Goal 2 (Week 1): Pt will perform sit to stands in ADL without UE support wtih contact guard demonstrating improved balance OT Short Term Goal 3 (Week 1): Pt will don LB clothing including footwear with min A OT Short Term Goal 4 (Week 1): pt will perform toileting with min A for balance  Skilled Therapeutic Interventions/Progress Updates:     Pt received in bed ready for therapy.  Focus of therapy session on balance, R side motor control and safety awareness.       ADL Retraining: -shower on tub bench with pt actively using R hand to wash L arm, grab soap bottle.  He did not have enough strength to squeeze soap out of bottle. Able to push R arm through shirt and sweatshirt sleeves without A.  Cues to don R extremities in clothing first.  Pt donned pants with only CGA even using R hand to pull pants over hips. Min A to start socks and then pt able to complete pulling over.   Transfers: -cues to move slowly with bed mobility to not rush coming to EOB to ensure he has good trunk control and to then ensure feet are in adequate position for sit to stand -CGA sit to stands without RW and then pt places hands on RW to stand pivot and /or take 5-6 steps with min A.  Balance: -light CGA when standing in shower or when pulling clothing over hips with no UE support  Neuromuscular Re-Education:  -R shoulder a/arom with using B hands on RW handles and with walker turned backwards rolling it back and forth.  Pt has fair grasp but needs to work on grasp and pinch strength.   Pt opted to rest in bed as he had 90 min until next session.    Pt resting in  bed with all needs met. Alarm set and call light in reach.     Therapy Documentation Precautions:  Precautions Precautions: Fall Precaution Comments: R hemi Restrictions Weight Bearing Restrictions Per Provider Order: No Therapy Vitals Pulse Rate: (!) 56 Pain: Pain Assessment Pain Scale: 0-10 Pain Score: 0-No pain ADL: ADL Eating: Set up Grooming: Setup Where Assessed-Grooming: Sitting at sink Upper Body Bathing: Supervision/safety Where Assessed-Upper Body Bathing: Shower Lower Body Bathing: Contact guard Where Assessed-Lower Body Bathing: Shower Upper Body Dressing: Minimal assistance Where Assessed-Upper Body Dressing: Chair Lower Body Dressing: Minimal assistance Where Assessed-Lower Body Dressing: Chair Toileting: Moderate assistance Where Assessed-Toileting: Teacher, Adult Education: Curator Method: Risk Manager: Insurance Underwriter Method: Stand pivot  Therapy/Group: Individual Therapy  Adrian [name] 08/17/2023, 12:15 PM

## 2023-08-17 NOTE — Progress Notes (Signed)
 Occupational Therapy Session Note  Patient Details  Name: Adrian [name] MRN: [id number] Date of Birth: [birth year]  Today's Date: 08/17/2023 OT Individual Time: 8890-8796 OT Individual Time Calculation (min): 54 min    Short Term Goals: Week 1:  OT Short Term Goal 1 (Week 1): Pt will be able to pick up dinger foods to feed self with min A OT Short Term Goal 2 (Week 1): Pt will perform sit to stands in ADL without UE support wtih contact guard demonstrating improved balance OT Short Term Goal 3 (Week 1): Pt will don LB clothing including footwear with min A OT Short Term Goal 4 (Week 1): pt will perform toileting with min A for balance  Skilled Therapeutic Interventions/Progress Updates:   Pt greeted supine in bed, pt agreeable to OT intervention.      Transfers/bed mobility/functional mobility: pt completed supine>sit with CGA with MIN verbal cues needed to recall hooking method with LLE. Pt completed sit>stands throughout session with CGA with MIN verbal cues for hand placement.   ADLs:  Footwear: MAX A to don shoes from EOB   Transfers: ambulatory toilet ransfer with RW with MINA, decreased coordination in RLE during heel/toe Toileting: MINA for 3/3 toileting tasks needing assist for clothing mgmt, continent urine void.  Self feeding: issued pt built up handle for utensils to be trialed at lunch.   NMR: pt completed various exercises to provide NMR to RUE to facilitate improved strength/coordination in RUE.  - pt completed dynamic reaching to place horseshoes over rim of checkered board with an emphasis on improved motor planning, proprioception and cylindrical grasp. Pt completed task with CGA for balance and MIN verbal cues for technique - graded task up and provided bimanual task with pt instructed to pin Washcloths to board with weighted clothespins with RUE completing coordination task - pt also able to stand at hi lo table to place Pegs  in board with RUE to challenge [facility] and  improved proprioception and motor planning, graded task up and provided 2.5 wrist weight to RUE to allow for increased NMR.  - pt able to sit and use RUE to recreate block structure with an emphasis on [facility] and providing proximal support to decrease ataxia, pt needed + time with this task but able to complete with supervision and MIN verbal cues for technique.   Ended session with pt supine in bed with all needs within reach and bed alarm activated.                   Therapy Documentation Precautions:  Precautions Precautions: Fall Precaution Comments: R hemi Restrictions Weight Bearing Restrictions Per Provider Order: No  Pain: No pain    Therapy/Group: Individual Therapy  Ronal Mallie Needy 08/17/2023, 12:23 PM

## 2023-08-18 DIAGNOSIS — R001 Bradycardia, unspecified: Secondary | ICD-10-CM | POA: Diagnosis not present

## 2023-08-18 DIAGNOSIS — I610 Nontraumatic intracerebral hemorrhage in hemisphere, subcortical: Secondary | ICD-10-CM | POA: Diagnosis not present

## 2023-08-18 DIAGNOSIS — I10 Essential (primary) hypertension: Secondary | ICD-10-CM | POA: Diagnosis not present

## 2023-08-18 NOTE — Progress Notes (Signed)
 Metoprolol held due to low HR (55)/ Street, [place] aware.   Nigel Sloop, LPN

## 2023-08-18 NOTE — Progress Notes (Signed)
 PROGRESS NOTE   Subjective/Complaints:  Pt doing well, slept ok, denies pain, LBM yesterday, urinating fine. Denies any other complaints or concerns. Nurse asking about his metoprolol  order being halved. Held it this morning.   ROS- neg CP, SOB, N/V/D, no abd pain or bladder issues   Objective:   DG Swallowing Func-Speech Pathology Result Date: 08/16/2023 Table formatting from the original result was not included. Modified Barium Swallow Study Patient Details Name: Adrian [name] MRN: [id number] Date of Birth: [birth year] Today's Date: 08/16/2023 HPI/PMH: HPI: Adrian [name] is a 68 yo male presenting to ED 1/4 with sudden onset R sided weakness. CTH includes ICH centered in the L basal ganglia with intraventricular extension. Pt seen August-October 2020 for dysphagia and cognitive by SLP targeting executive functioning, memory, attention, aphasia, and anomia.  PMH includes A-fib, carotid artery stenosis, hypothyroidism, HLD Clinical Impression: Patient presents with moderate oropharyngeal dysphagia. Oral phase is characterized by reduced lingual control/coordination resulting in posterior spillage to the pyriforms and trace-mild oral residuals throughout the oral cavity. Pharyngeal phase is remarkable for incomplete epiglottic inversion, reduced laryngeal vestibular closure, diminished pharyngeal stripping wave, reduced hyoid excursion reduced tongue base retraction, and marked obstruction of PES opening. These deficits resulted in audible aspiration of thin liquids during consumption of pill (no other aspiration observed during thin liquids, occasional penetration) and penetration of NTL to vocal cords, though spontaneously cleared. Use of chin tuck and head turn to reduce penetration/aspiration was ineffective. No penetration/aspiration present during puree and solids. Moderate-severe pharyngeal residuals present throughout the pharyngeal cavity and  worsened as viscosity increased. Residuals were effectively reduced with use of multiple swallows. Recommend continuation of current diet (regular/thin) with multiple dry swallows after solids. Medications should be consumed crushed in puree, as patient unable to swallow pill during study and concern for lodging in vallecula. Patient should consume single, small sips of thin liquids with occasional throat clear. Recommend intermittent supervision to cue for compensatory strategies and initiation of pharyngeal strengthening exercises. Factors that may increase risk of adverse event in presence of aspiration Noe & Lianne 2021): No data recorded Recommendations/Plan: Swallowing Evaluation Recommendations Swallowing Evaluation Recommendations Recommendations: PO diet PO Diet Recommendation: Regular; Thin liquids (Level 0) Liquid Administration via: Cup; Straw Medication Administration: Crushed with puree Supervision: Patient able to self-feed; Staff to assist with self-feeding; Intermittent supervision/cueing for swallowing strategies Swallowing strategies  : Minimize environmental distractions; Slow rate; Small bites/sips; Multiple dry swallows after each bite/sip; Clear throat intermittently Postural changes: Position pt fully upright for meals Oral care recommendations: Oral care BID (2x/day) Treatment Plan Treatment Plan Treatment recommendations: Therapy as outlined in treatment plan below Follow-up recommendations: Outpatient SLP Recommendations Comment: OP ST Functional status assessment: Patient has had a recent decline in their functional status and demonstrates the ability to make significant improvements in function in a reasonable and predictable amount of time. Treatment frequency: Min 2x/week Treatment duration: 2 weeks Interventions: Aspiration precaution training; Oropharyngeal exercises; Compensatory techniques; Patient/family education Recommendations Recommendations for follow up therapy are one  component of a multi-disciplinary discharge planning process, led by the attending physician.  Recommendations may be updated based on patient status, additional functional criteria and insurance authorization. Assessment:  Orofacial Exam: Orofacial Exam Oral Cavity: Oral Hygiene: WFL Oral Cavity - Dentition: Adequate natural dentition Orofacial Anatomy: WFL Oral Motor/Sensory Function: WFL Anatomy: Anatomy: Presence of cervical hardware; Prominent cricopharyngeus Boluses Administered: Boluses Administered Boluses Administered: Thin liquids (Level 0); Mildly thick liquids (Level 2, nectar thick); Puree; Solid  Oral Impairment Domain: Oral Impairment Domain Lip Closure: No labial escape Tongue control during bolus hold: Cohesive bolus between tongue to palatal seal Bolus preparation/mastication: Timely and efficient chewing and mashing Bolus transport/lingual motion: Repetitive/disorganized tongue motion Oral residue: Trace residue lining oral structures Location of oral residue : Tongue; Palate Initiation of pharyngeal swallow : Pyriform sinuses  Pharyngeal Impairment Domain: Pharyngeal Impairment Domain Soft palate elevation: No bolus between soft palate (SP)/pharyngeal wall (PW) Laryngeal elevation: Complete superior movement of thyroid  cartilage with complete approximation of arytenoids to epiglottic petiole Anterior hyoid excursion: Partial anterior movement Epiglottic movement: Partial inversion Laryngeal vestibule closure: Incomplete, narrow column air/contrast in laryngeal vestibule Pharyngeal stripping wave : Present - diminished Pharyngeal contraction (A/P view only): N/A Pharyngoesophageal segment opening: Minimal distention/minimal duration, marked obstruction of flow Tongue base retraction: Narrow column of contrast or air between tongue base and PPW Pharyngeal residue: Collection of residue within or on pharyngeal structures; Majority of contrast within or on pharyngeal structures Location of pharyngeal  residue: Diffuse (>3 areas)  Esophageal Impairment Domain: No data recorded Pill: Pill Consistency administered: Thin liquids (Level 0) Thin liquids (Level 0): Impaired (see clinical impressions) Penetration/Aspiration Scale Score: Penetration/Aspiration Scale Score 1.  Material does not enter airway: Solid; Pill; Puree 4.  Material enters airway, CONTACTS cords then ejected out: Mildly thick liquids (Level 2, nectar thick) 7.  Material enters airway, passes BELOW cords and not ejected out despite cough attempt by patient: Thin liquids (Level 0) Compensatory Strategies: Compensatory Strategies Compensatory strategies: Yes Multiple swallows: Effective Effective Multiple Swallows: Mildly thick liquid (Level 2, nectar thick); Puree; Solid Chin tuck: Ineffective Ineffective Chin Tuck: Thin liquid (Level 0) Left head turn: Ineffective Ineffective Left Head Turn: Thin liquid (Level 0) Right head turn: Ineffective Ineffective Right Head Turn: Thin liquid (Level 0)   General Information: No data recorded Diet Prior to this Study: Regular; Thin liquids (Level 0)   No data recorded  Respiratory Status: WFL   Supplemental O2: None (Room air)   No data recorded Behavior/Cognition: Alert; Cooperative Self-Feeding Abilities: Able to self-feed Baseline vocal quality/speech: Normal Volitional Cough: Able to elicit Volitional Swallow: Able to elicit No data recorded Goal Planning: Prognosis for improved oropharyngeal function: Good No data recorded Barriers/Prognosis Comment: n/a Patient/Family Stated Goal: none stated Consulted and agree with results and recommendations: Patient Pain: No data recorded End of Session: Start Time:No data recorded Stop Time: No data recorded Time Calculation:No data recorded Charges: No data recorded SLP visit diagnosis: SLP Visit Diagnosis: Dysphagia, oropharyngeal phase (R13.12) Past Medical History: Past Medical History: Diagnosis Date  AF (paroxysmal atrial fibrillation) (HCC) 11/29/2018  Atrial  flutter (HCC) 10/12/2015  a. TEE 3/17 with ? LAA clot-->s/p TEE/DCCV 11/24/2015; s/p DCCV 12/2022;s/p ablation 06/2023  CAD (coronary artery disease), native coronary artery 12/06/2015  a. 10/2015 MV: EF  37%, reversible defect inferior apex, intermediate risk findings; b. 10/2015 Cath: 20% mid RCA.  Carotid artery stenosis   1-39% right ICA stenosis and occluded left ICA  Colonic diverticular abscess   Diverticulitis   History of chemotherapy 2005  Cisplatin  Hypothyroidism   NICM (nonischemic cardiomyopathy) (HCC) 10/14/2015  a. Tachy mediated?;  b. Echo 3/17 - Mild concentric LVH, EF 30-35%,  anteroseptal, anterior, anterolateral, apical anterior, lateral hypokinesis, trivial MR, mild to moderately reduced RVSF; c. LHC 3/17 - mRCA 20%  Radiation NOv.3,2005-Dec. 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  XRT Past Surgical History: Past Surgical History: Procedure Laterality Date  A-FLUTTER ABLATION N/A 06/19/2023  Procedure: A-FLUTTER ABLATION;  Surgeon: Cindie Ole DASEN, MD;  Location: [facility] INVASIVE CV LAB;  Service: Cardiovascular;  Laterality: N/A;  APPENDECTOMY N/A 03/07/2019  Procedure: ROBOTIC ASSISTED APPENDECTOMY;  Surgeon: Sheldon Standing, MD;  Location: [facility] ORS;  Service: General;  Laterality: N/A;  CARDIAC CATHETERIZATION N/A 10/18/2015  Procedure: Left Heart Cath and Coronary Angiography;  Surgeon: Lonni JONETTA Cash, MD;  Location: [facility] INVASIVE CV LAB;  Service: Cardiovascular;  Laterality: N/A;  CARDIOVERSION N/A 11/24/2015  Procedure: CARDIOVERSION;  Surgeon: Maude JAYSON Emmer, MD;  Location: [facility] ENDOSCOPY;  Service: Cardiovascular;  Laterality: N/A;  CARDIOVERSION N/A 12/20/2022  Procedure: CARDIOVERSION;  Surgeon: Alvan Ronal BRAVO, MD;  Location: [facility] INVASIVE CV LAB;  Service: Cardiovascular;  Laterality: N/A;  CYSTOSCOPY WITH STENT PLACEMENT Bilateral 03/07/2019  Procedure: CYSTOSCOPY WITH BILATERAL FIREFLY INJECTION;  Surgeon: Cam Morene ORN, MD;  Location: [facility] ORS;   Service: Urology;  Laterality: Bilateral;  GASTROSTOMY TUBE PLACEMENT  07/04/2004  IR - G tube for tonsilar cancer  IR ANGIO INTRA EXTRACRAN SEL COM CAROTID INNOMINATE UNI R MOD SED  03/09/2019  IR ANGIO VERTEBRAL SEL VERTEBRAL UNI R MOD SED  03/09/2019  IR CT HEAD LTD  03/09/2019  IR PERCUTANEOUS ART THROMBECTOMY/INFUSION INTRACRANIAL INC DIAG ANGIO  03/09/2019  IR RADIOLOGIST EVAL & MGMT  12/18/2018  LAMINECTOMY    C5/placement of steel plate  LOOP RECORDER INSERTION N/A 06/19/2023  Procedure: LOOP RECORDER INSERTION;  Surgeon: Cindie Ole DASEN, MD;  Location: [facility] INVASIVE CV LAB;  Service: Cardiovascular;  Laterality: N/A;  NECK SURGERY  2003  replaced disk  RADIOLOGY WITH ANESTHESIA N/A 03/08/2019  Procedure: IR WITH ANESTHESIA;  Surgeon: Dolphus Carrion, MD;  Location: [facility] OR;  Service: Radiology;  Laterality: N/A;  TEE WITHOUT CARDIOVERSION N/A 10/15/2015  Procedure: TRANSESOPHAGEAL ECHOCARDIOGRAM (TEE);  Surgeon: Ezra GORMAN Shuck, MD;  Location: [facility] ENDOSCOPY;  Service: Cardiovascular;  Laterality: N/A;  TEE WITHOUT CARDIOVERSION N/A 11/24/2015  Procedure: TRANSESOPHAGEAL ECHOCARDIOGRAM (TEE);  Surgeon: Maude JAYSON Emmer, MD;  Location: [facility] ENDOSCOPY;  Service: Cardiovascular;  Laterality: N/A;  TEE WITHOUT CARDIOVERSION N/A 12/20/2022  Procedure: TRANSESOPHAGEAL ECHOCARDIOGRAM;  Surgeon: Alvan Ronal BRAVO, MD;  Location: [facility] INVASIVE CV LAB;  Service: Cardiovascular;  Laterality: N/A;  XI ROBOTIC ASSISTED COLOSTOMY TAKEDOWN N/A 03/07/2019  Procedure: XI ROBOTIC ASSISTED LOW ANTERIOR RESECTION, RIGID PROCTOSCOPY;  Surgeon: Sheldon Standing, MD;  Location: [facility] ORS;  Service: General;  Laterality: N/A; Cassidi F Sockwell 08/16/2023, 4:28 PM  No results for input(s): WBC, HGB, HCT, PLT in the last 72 hours.  No results for input(s): NA, K, CL, CO2, GLUCOSE, BUN, CREATININE, CALCIUM  in the last 72 hours.   Intake/Output Summary (Last 24 hours) at 08/18/2023 0836 Last data filed at 08/18/2023 0720 Gross per 24  hour  Intake 837 ml  Output 350 ml  Net 487 ml        Physical Exam: Vital Signs Blood pressure (!) 141/79, pulse (!) 55, temperature 98 F (36.7 C), resp. rate 18, height 5' 8 (1.727 m), weight 60.5 kg, SpO2 95%.   General: No acute distress, resting comfortably in bed Mood and affect are appropriate Heart: Regular rate and rhythm no rubs murmurs or extra sounds Lungs: Clear  to auscultation, breathing unlabored, no rales or wheezes Abdomen: Positive bowel sounds, soft nontender to palpation, nondistended Extremities: No clubbing, cyanosis, or edema Skin: No evidence of breakdown, no evidence of rash over exposed surfaces  PRIOR EXAMS: Neurologic: Cranial nerves II through XII intact, motor strength is 5/5 in left and 3- R deltoid, bicep, tricep, grip,5/5 left and 3- right  hip flexor, knee extensors,  1/5 on right 5/5 left ankle dorsiflexor and plantar flexor Sensory exam normal sensation to light touch  in bilateral upper and lower extremities Intact finger to thumb opposition but slow on Right side  Musculoskeletal: Full range of motion in all 4 extremities. No joint swelling   Assessment/Plan: 1. Functional deficits which require 3+ hours per day of interdisciplinary therapy in a comprehensive inpatient rehab setting. Physiatrist is providing close team supervision and 24 hour management of active medical problems listed below. Physiatrist and rehab team continue to assess barriers to discharge/monitor patient progress toward functional and medical goals  Care Tool:  Bathing    Body parts bathed by patient: Right arm, Chest, Abdomen, Front perineal area, Right upper leg, Buttocks, Left upper leg, Right lower leg, Left lower leg, Face   Body parts bathed by helper: Left arm     Bathing assist Assist Level: Minimal Assistance - Patient > 75%     Upper Body Dressing/Undressing Upper body dressing   What is the patient wearing?: Pull over shirt    Upper body assist  Assist Level: Maximal Assistance - Patient 25 - 49%    Lower Body Dressing/Undressing Lower body dressing      What is the patient wearing?: Pants     Lower body assist Assist for lower body dressing: Maximal Assistance - Patient 25 - 49%     Toileting Toileting    Toileting assist Assist for toileting: Moderate Assistance - Patient 50 - 74%     Transfers Chair/bed transfer  Transfers assist     Chair/bed transfer assist level: Minimal Assistance - Patient > 75%     Locomotion Ambulation   Ambulation assist      Assist level: 2 helpers Assistive device:  (B HHA) Max distance: 8 feet   Walk 10 feet activity   Assist     Assist level: 2 helpers     Walk 50 feet activity   Assist Walk 50 feet with 2 turns activity did not occur: Safety/medical concerns (fatigue)         Walk 150 feet activity   Assist Walk 150 feet activity did not occur: Safety/medical concerns (fatigue)         Walk 10 feet on uneven surface  activity   Assist Walk 10 feet on uneven surfaces activity did not occur: Safety/medical concerns         Wheelchair     Assist Is the patient using a wheelchair?: Yes Type of Wheelchair: Manual    Wheelchair assist level: Dependent - Patient 0%      Wheelchair 50 feet with 2 turns activity    Assist        Assist Level: Dependent - Patient 0%   Wheelchair 150 feet activity     Assist      Assist Level: Dependent - Patient 0%   Blood pressure (!) 141/79, pulse (!) 55, temperature 98 F (36.7 C), resp. rate 18, height 5' 8 (1.727 m), weight 60.5 kg, SpO2 95%.  Medical Problem List and Plan: 1. Functional deficits secondary to left basal ganglia hemorrhage d/t  hypertension and anticoagulation  with subsequent right hemiparesis             -patient may  shower -ELOS/Goals: 12-16 days, goals mod I with PT and OT and independent with SLP -PRAFO while in bed to stretch right heel cord. I suspect he  will need an AFO for gait. 2.  Antithrombotics: -DVT/anticoagulation:  Pharmaceutical: Heparin  5000U q8h             -antiplatelet therapy: none   3. Pain Management: Tylenol  and robaxin  as needed   4. Mood/Behavior/Sleep: LCSW to evaluate and provide emotional support             -antipsychotic agents: n/a   5. Neuropsych/cognition: This patient is capable of making decisions on his own behalf although he appears to have some short term memory deficits   6. Skin/Wound Care: Routine skin care checks   7. Fluids/Electrolytes/Nutrition: Routine Is and Os and follow-up chemistries   8: Hypertension: monitor TID and prn. Fair control at present             -Continue amlodipine  2.5 mg daily             -Continue metoprolol  tartrate 50 mg twice daily -Sys BP improved, remains bradycardic on beta blocker  -08/18/23 BPs up a little, but they held his metoprolol  several days d/t bradycardia. Could consider halving dose of metoprolol , but will see how his BPs go the next few days Vitals:   08/14/23 1926 08/15/23 0444 08/15/23 0934 08/15/23 1245  BP: (!) 147/73 123/70 135/79 (!) 140/75   08/15/23 1939 08/16/23 0410 08/16/23 1333 08/16/23 1943  BP: (!) 152/74 137/82 (!) 164/78 (!) 147/78   08/17/23 0420 08/17/23 1404 08/17/23 2003 08/18/23 0442  BP: (!) 150/85 (!) 153/81 (!) 145/69 (!) 141/79    9: Hyperlipidemia: continue Lipitor  80 mg daily   10: Hypothyroidism: Continue Synthroid  125 mcg every morning   11: Tobacco use: Cessation counseling   12: Atrial fibrillation: Now off Eliquis  due to ICH.  Will follow-up with neurology in 2 weeks to consider aspire trial, restart Eliquis  in 2 weeks if stable and follow-up with cardiology. Continue amiodarone  200mg  daily   13: Chronic leukopenia: Follow-up CBC   14: Mild thrombocytopenia: Follow-up CBC      LOS: 4 days A FACE TO FACE EVALUATION WAS PERFORMED  [address] 08/18/2023, 8:36 AM

## 2023-08-18 NOTE — Plan of Care (Signed)
  Problem: Consults Goal: RH STROKE PATIENT EDUCATION Description: See Patient Education module for education specifics  Outcome: Progressing   Problem: RH BOWEL ELIMINATION Goal: RH STG MANAGE BOWEL WITH ASSISTANCE Description: STG Manage Bowel with mod I Assistance. Outcome: Progressing   Problem: RH SAFETY Goal: RH STG ADHERE TO SAFETY PRECAUTIONS W/ASSISTANCE/DEVICE Description: STG Adhere to Safety Precautions With cues Outcome: Progressing   Problem: RH KNOWLEDGE DEFICIT Goal: RH STG INCREASE KNOWLEDGE OF DIABETES Outcome: Progressing Goal: RH STG INCREASE KNOWLEDGE OF HYPERTENSION Description: Patient manage care re: hypertension at discharge using educational materials independently Outcome: Progressing Goal: RH STG INCREASE KNOWLEGDE OF HYPERLIPIDEMIA Outcome: Progressing Goal: RH STG INCREASE KNOWLEDGE OF STROKE PROPHYLAXIS Outcome: Progressing

## 2023-08-18 NOTE — Progress Notes (Signed)
 Occupational Therapy Session Note  Patient Details  Name: Adrian [name] MRN: [id number] Date of Birth: Jul 08, 1956  Today's Date: 08/18/2023 OT Individual Time: 8893-8794 OT Individual Time Calculation (min): 59 min    Short Term Goals: Week 1:  OT Short Term Goal 1 (Week 1): Pt will be able to pick up dinger foods to feed self with min A OT Short Term Goal 2 (Week 1): Pt will perform sit to stands in ADL without UE support wtih contact guard demonstrating improved balance OT Short Term Goal 3 (Week 1): Pt will don LB clothing including footwear with min A OT Short Term Goal 4 (Week 1): pt will perform toileting with min A for balance  Skilled Therapeutic Interventions/Progress Updates:     Pt received in bed ready for therapy.  Focus of therapy session on Balance and R side functional use.      ADL Retraining: -pt requested to toilet, ambulated with RW with min A to bathroom with pt attending well to R foot to ensure it was stable prior to advancing L foot.  Toileted with CGA for balance as he adjusted clothing over hips -sat at sink in wc to complete oral care with no assist -washed hands, need A to squeeze soap out of tube with R hand  Neuromuscular Re-Education:  -pt taken to gym and transferred to mat stand pivot with min A  -provided with medium theraputty to use in room. Pt practiced grasping and squeezing putty to help with grasp strength, worked on tip pinch squeezes with putty -lateral and tip pinch strengthening with red clothespins and placing them on bar with good control -shoulder AROM with reaching 2 lb bar forward and back but with some shoulder elevation as pt using upper trap.  He demonstrated improved control when he worked on AROM with a large hula hoop and turning it like a wheel with no shoulder elevation -for RLE motor control pt worked on 10 sit to squats with a brief isometric squat hold and then eccentric lowering to sit in 3 cts.  He repeated for 2 sets.    Pt's  verbal expression improving with him explaining things with approximately 60% word recall.  Pt resting in bed with all needs met. Alarm set and call light in reach.     Therapy Documentation Precautions:  Precautions Precautions: Fall Precaution Comments: R hemi Restrictions Weight Bearing Restrictions Per Provider Order: No    Pain:  No c/o pain  ADL: ADL Eating: Set up Grooming: Setup Where Assessed-Grooming: Sitting at sink Upper Body Bathing: Supervision/safety Where Assessed-Upper Body Bathing: Shower Lower Body Bathing: Contact guard Where Assessed-Lower Body Bathing: Shower Upper Body Dressing: Minimal assistance Where Assessed-Upper Body Dressing: Chair Lower Body Dressing: Minimal assistance Where Assessed-Lower Body Dressing: Chair Toileting: Moderate assistance Where Assessed-Toileting: Teacher, Adult Education: Curator Method: Risk Manager: Insurance Underwriter Method: Stand pivot   Therapy/Group: Individual Therapy  Wyatt Galvan 08/18/2023, 12:47 PM

## 2023-08-18 NOTE — Progress Notes (Signed)
 Speech Language Pathology Daily Session Note  Patient Details  Name: FERRIS FIELDEN MRN: [id number] Date of Birth: 05-07-1956  Today's Date: 08/18/2023 SLP Individual Time: 1345-1445 SLP Individual Time Calculation (min): 60 min  Short Term Goals: Week 1: SLP Short Term Goal 1 (Week 1): Patient will participate in MBS to assess oropharyngeal swallow function SLP Short Term Goal 2 (Week 1): Patient will increase speech intelligibility to 90% during conversation with use of strategies given min multimodal A SLP Short Term Goal 3 (Week 1): Patient will utilize compensatory strategies to increase word finding during abstract thoughts given min multimodal A SLP Short Term Goal 4 (Week 1): Patient will demonstrate problem solving during mildly complex functional situations given min multimodal A SLP Short Term Goal 5 (Week 1): Patient will recall and utilize memory compensatory aids given min multimodal A  Skilled Therapeutic Interventions: Skilled therapy session focused on cognitive goals. SLP faciliated session through prompting patient to complete BID pillbox according to written directions. Patient utilzed problem solving and organizational skills to complete pill box independently with no mistakes. At completion of BID pill box, SLP encouraged patient to find (intentional) mistakes in pill box and correct. Patient did so independently. SLP encouraged patient to memorize medications and what they were for in which patient required minA. At the end of the session, SLP educated patient on WRAP (write, repeat, associate, picture) memory strategies and uses for each. Patient provided examples of use given minA. Patient left in bed with alarm set and call bell in reach. Continue POC.  Pain None reported   Therapy/Group: Individual Therapy  Aleaya Latona M.A., CF-SLP 08/18/2023, 7:30 AM

## 2023-08-19 DIAGNOSIS — R001 Bradycardia, unspecified: Secondary | ICD-10-CM | POA: Diagnosis not present

## 2023-08-19 DIAGNOSIS — I610 Nontraumatic intracerebral hemorrhage in hemisphere, subcortical: Secondary | ICD-10-CM | POA: Diagnosis not present

## 2023-08-19 DIAGNOSIS — I10 Essential (primary) hypertension: Secondary | ICD-10-CM | POA: Diagnosis not present

## 2023-08-19 NOTE — Progress Notes (Signed)
 Occupational Therapy Session Note  Patient Details  Name: Adrian [name] MRN: [id number] Date of Birth: 17-Aug-1955  Today's Date: 08/20/2023 OT Individual Time: 9064-8952 OT Individual Time Calculation (min): 72 min    Short Term Goals: Week 1:  OT Short Term Goal 1 (Week 1): Pt will be able to pick up finger foods to feed self with min A OT Short Term Goal 2 (Week 1): Pt will perform sit to stands in ADL without UE support wtih contact guard demonstrating improved balance OT Short Term Goal 3 (Week 1): Pt will don LB clothing including footwear with min A OT Short Term Goal 4 (Week 1): pt will perform toileting with min A for balance  Skilled Therapeutic Interventions/Progress Updates:    Patient agreeable to participate in OT session. Reports 0/10 pain level.   Patient participated in skilled OT session focusing on functional transfers, NM re-edu.  Pt participated in NM re-education to the RUE in order to increase functional use of right hand while assisting with self care tasks.  Pt completed bed mobility with SBA with HOB elevated and use of bed rail.  Functional transfer Grip strength:  - red putty, grip/release, 20X, Right hand.  Box and blocks: Left: 43, Right: 24 9 hole peg test: Left: 28.5, Right: 1'48 Grip strength: Left: 75#, Right: 55# Lateral pinch: Left: 17#, Right: 14#  Pt participated in functional reaching task while integrating static standing balance utilizing resistive clothespins. 3/4lb wrist weight placed on RUE. Min A provided for standing balance while utilizing RW. VC were provided for standing posture/balance when pt demonstrated muscle fatigue. Able to place ~4 pins before VC was needed to take a seated rest break. Completed 2 standing trails to place 8 total clothespins. Pt then stood for a 3rd time to remove all pins with right hand using RW for balance and Min A.   Pt removed tennis shoes with moderate difficulty and removed shoelaces from both shoes with  increased time using primarily his right hand.   Pt completed step pivot transfer from [facility] to bed using RW once returned to room. VC for hand placement with RW management provided prior to sit to stand transition. Min A provided during transfer. Pt was able to doff shoes seated EOB without physical assist.    Therapy Documentation Precautions:  Precautions Precautions: Fall Precaution Comments: R hemi Restrictions Weight Bearing Restrictions Per Provider Order: No  Therapy/Group: Individual Therapy  Leita Howell, OTR/L,CBIS  Supplemental OT - [facility] and [facility] Secure Chat Preferred   08/20/2023, 10:33 AM

## 2023-08-19 NOTE — Plan of Care (Signed)
  Problem: Consults Goal: RH STROKE PATIENT EDUCATION Description: See Patient Education module for education specifics  Outcome: Progressing   Problem: RH BOWEL ELIMINATION Goal: RH STG MANAGE BOWEL WITH ASSISTANCE Description: STG Manage Bowel with mod I Assistance. Outcome: Progressing   Problem: RH SAFETY Goal: RH STG ADHERE TO SAFETY PRECAUTIONS W/ASSISTANCE/DEVICE Description: STG Adhere to Safety Precautions With cues Outcome: Progressing   Problem: RH KNOWLEDGE DEFICIT Goal: RH STG INCREASE KNOWLEDGE OF DIABETES Outcome: Progressing Goal: RH STG INCREASE KNOWLEDGE OF HYPERTENSION Description: Patient manage care re: hypertension at discharge using educational materials independently Outcome: Progressing Goal: RH STG INCREASE KNOWLEGDE OF HYPERLIPIDEMIA Outcome: Progressing Goal: RH STG INCREASE KNOWLEDGE OF STROKE PROPHYLAXIS Outcome: Progressing

## 2023-08-19 NOTE — Progress Notes (Signed)
 PROGRESS NOTE   Subjective/Complaints:  Pt doing well, slept well, denies pain, LBM last night, urinating fine. Denies any other complaints or concerns. HR remaining stable in the upper 50s-60s overnight, despite a couple skipped doses recently.   ROS- neg CP, SOB, N/V/D, no abd pain or bladder issues   Objective:   No results found.  No results for input(s): WBC, HGB, HCT, PLT in the last 72 hours.  No results for input(s): NA, K, CL, CO2, GLUCOSE, BUN, CREATININE, CALCIUM  in the last 72 hours.   Intake/Output Summary (Last 24 hours) at 08/19/2023 0831 Last data filed at 08/19/2023 0801 Gross per 24 hour  Intake 777 ml  Output 650 ml  Net 127 ml        Physical Exam: Vital Signs Blood pressure (!) 142/77, pulse (!) 57, temperature 97.6 F (36.4 C), resp. rate 18, height 5' 8 (1.727 m), weight 60.5 kg, SpO2 98%.   General: No acute distress, resting comfortably in bed Mood and affect are appropriate Heart: mildly bradycardic, reg rhythm no rubs murmurs or extra sounds Lungs: Clear to auscultation, breathing unlabored, no rales or wheezes Abdomen: Positive bowel sounds, soft nontender to palpation, nondistended Extremities: No clubbing, cyanosis, or edema Skin: No evidence of breakdown, no evidence of rash over exposed surfaces  PRIOR EXAMS: Neurologic: Cranial nerves II through XII intact, motor strength is 5/5 in left and 3- R deltoid, bicep, tricep, grip,5/5 left and 3- right  hip flexor, knee extensors,  1/5 on right 5/5 left ankle dorsiflexor and plantar flexor Sensory exam normal sensation to light touch  in bilateral upper and lower extremities Intact finger to thumb opposition but slow on Right side  Musculoskeletal: Full range of motion in all 4 extremities. No joint swelling   Assessment/Plan: 1. Functional deficits which require 3+ hours per day of interdisciplinary therapy in a  comprehensive inpatient rehab setting. Physiatrist is providing close team supervision and 24 hour management of active medical problems listed below. Physiatrist and rehab team continue to assess barriers to discharge/monitor patient progress toward functional and medical goals  Care Tool:  Bathing    Body parts bathed by patient: Right arm, Chest, Abdomen, Front perineal area, Right upper leg, Buttocks, Left upper leg, Right lower leg, Left lower leg, Face   Body parts bathed by helper: Left arm     Bathing assist Assist Level: Minimal Assistance - Patient > 75%     Upper Body Dressing/Undressing Upper body dressing   What is the patient wearing?: Pull over shirt    Upper body assist Assist Level: Maximal Assistance - Patient 25 - 49%    Lower Body Dressing/Undressing Lower body dressing      What is the patient wearing?: Pants     Lower body assist Assist for lower body dressing: Maximal Assistance - Patient 25 - 49%     Toileting Toileting    Toileting assist Assist for toileting: Moderate Assistance - Patient 50 - 74%     Transfers Chair/bed transfer  Transfers assist     Chair/bed transfer assist level: Minimal Assistance - Patient > 75%     Locomotion Ambulation   Ambulation assist  Assist level: 2 helpers Assistive device:  (B HHA) Max distance: 8 feet   Walk 10 feet activity   Assist     Assist level: 2 helpers     Walk 50 feet activity   Assist Walk 50 feet with 2 turns activity did not occur: Safety/medical concerns (fatigue)         Walk 150 feet activity   Assist Walk 150 feet activity did not occur: Safety/medical concerns (fatigue)         Walk 10 feet on uneven surface  activity   Assist Walk 10 feet on uneven surfaces activity did not occur: Safety/medical concerns         Wheelchair     Assist Is the patient using a wheelchair?: Yes Type of Wheelchair: Manual    Wheelchair assist level:  Dependent - Patient 0%      Wheelchair 50 feet with 2 turns activity    Assist        Assist Level: Dependent - Patient 0%   Wheelchair 150 feet activity     Assist      Assist Level: Dependent - Patient 0%   Blood pressure (!) 142/77, pulse (!) 57, temperature 97.6 F (36.4 C), resp. rate 18, height 5' 8 (1.727 m), weight 60.5 kg, SpO2 98%.  Medical Problem List and Plan: 1. Functional deficits secondary to left basal ganglia hemorrhage d/t hypertension and anticoagulation  with subsequent right hemiparesis             -patient may  shower -ELOS/Goals: 12-16 days, goals mod I with PT and OT and independent with SLP -PRAFO while in bed to stretch right heel cord. I suspect he will need an AFO for gait. -Continue CIR 2.  Antithrombotics: -DVT/anticoagulation:  Pharmaceutical: Heparin  5000U q8h             -antiplatelet therapy: none   3. Pain Management: Tylenol  and robaxin  as needed   4. Mood/Behavior/Sleep: LCSW to evaluate and provide emotional support             -antipsychotic agents: n/a   5. Neuropsych/cognition: This patient is capable of making decisions on his own behalf although he appears to have some short term memory deficits   6. Skin/Wound Care: Routine skin care checks   7. Fluids/Electrolytes/Nutrition: Routine Is and Os and follow-up chemistries   8: Hypertension/bradycardia: monitor TID and prn. Fair control at present             -Continue amlodipine  2.5 mg daily -Continue metoprolol  tartrate 50 mg twice daily>> decreased to 25mg  BID on 08/17/23 -Sys BP improved, remains bradycardic on beta blocker  -08/18/23 BPs up a little, but they held his metoprolol  several days d/t bradycardia. Could consider halving dose of metoprolol , but will see how his BPs go the next few days -08/19/23 BP still a little up, HR upper 50s-60s consistently despite several skipped doses of metoprolol -- doubt we need to decrease it now, continue current dose, if  bradycardia worsens then could consider lowering it, but might then need to increase amlodipine  for improved BP control Vitals:   08/15/23 1939 08/16/23 0410 08/16/23 1333 08/16/23 1943  BP: (!) 152/74 137/82 (!) 164/78 (!) 147/78   08/17/23 0420 08/17/23 1404 08/17/23 2003 08/18/23 0442  BP: (!) 150/85 (!) 153/81 (!) 145/69 (!) 141/79   08/18/23 1449 08/18/23 1927 08/19/23 0621 08/19/23 0813  BP: 136/77 117/78 (!) 144/86 (!) 142/77    9: Hyperlipidemia: continue Lipitor  80 mg daily  10: Hypothyroidism: Continue Synthroid  125 mcg every morning   11: Tobacco use: Cessation counseling   12: Atrial fibrillation: Now off Eliquis  due to ICH.  Will follow-up with neurology in 2 weeks to consider aspire trial, restart Eliquis  in 2 weeks if stable and follow-up with cardiology. Continue amiodarone  200mg  daily   13: Chronic leukopenia: Follow-up CBC   14: Mild thrombocytopenia: Follow-up CBC      LOS: 5 days A FACE TO FACE EVALUATION WAS PERFORMED  [address] 08/19/2023, 8:31 AM

## 2023-08-20 DIAGNOSIS — I10 Essential (primary) hypertension: Secondary | ICD-10-CM | POA: Diagnosis not present

## 2023-08-20 DIAGNOSIS — Z720 Tobacco use: Secondary | ICD-10-CM | POA: Diagnosis not present

## 2023-08-20 DIAGNOSIS — I610 Nontraumatic intracerebral hemorrhage in hemisphere, subcortical: Secondary | ICD-10-CM | POA: Diagnosis not present

## 2023-08-20 LAB — CBC
HCT: 41.6 % (ref 39.0–52.0)
Hemoglobin: 14.9 g/dL (ref 13.0–17.0)
MCH: 34.7 pg — ABNORMAL HIGH (ref 26.0–34.0)
MCHC: 35.8 g/dL (ref 30.0–36.0)
MCV: 96.7 fL (ref 80.0–100.0)
Platelets: 169 10*3/uL (ref 150–400)
RBC: 4.3 MIL/uL (ref 4.22–5.81)
RDW: 12 % (ref 11.5–15.5)
WBC: 2.8 10*3/uL — ABNORMAL LOW (ref 4.0–10.5)
nRBC: 0 % (ref 0.0–0.2)

## 2023-08-20 LAB — BASIC METABOLIC PANEL
Anion gap: 8 (ref 5–15)
BUN: 24 mg/dL — ABNORMAL HIGH (ref 8–23)
CO2: 24 mmol/L (ref 22–32)
Calcium: 9.2 mg/dL (ref 8.9–10.3)
Chloride: 97 mmol/L — ABNORMAL LOW (ref 98–111)
Creatinine, Ser: 1.01 mg/dL (ref 0.61–1.24)
GFR, Estimated: >60 mL/min (ref >60)
Glucose, Bld: 95 mg/dL (ref 70–99)
Potassium: 4.2 mmol/L (ref 3.5–5.1)
Sodium: 129 mmol/L — ABNORMAL LOW (ref 135–145)

## 2023-08-20 MED ORDER — METOPROLOL TARTRATE 12.5 MG HALF TABLET
12.5000 mg | ORAL_TABLET | Freq: Two times a day (BID) | ORAL | Status: DC
  Administered 2023-08-20 – 2023-08-28 (×16): 12.5 mg via ORAL
  Filled 2023-08-20 (×18): qty 1

## 2023-08-20 NOTE — Progress Notes (Signed)
 Physical Therapy Session Note  Patient Details  Name: Adrian [name] MRN: [id number] Date of Birth: 14-Dec-1955  Today's Date: 08/20/2023 PT Individual Time: 0808-0903 PT Individual Time Calculation (min): 55 min   Short Term Goals: Week 1:  PT Short Term Goal 1 (Week 1): pt will perform sit to stand with LRAD and supervision PT Short Term Goal 2 (Week 1): pt will perform bed to chair transfer with LRAD and supervision PT Short Term Goal 3 (Week 1): pt will ambulate 50 feet with LRAD and min A  Skilled Therapeutic Interventions/Progress Updates:  Patient supine in bed on entrance to room. Patient alert and agreeable to PT session.   Patient with no pain complaint at start of session. Just feeling fatigue from early start to therapy day.   Therapeutic Activity: Bed Mobility: Pt performed supine > sit with supervision. VC required for RLE positioning. Performs dressing while seated on EOB and requires CGA overall for shirt and sweatshirt, supervision for socks and MaxA for shoes mainly for time.  Transfers: Pt performed sit<>stand transfers during session with CGA at most and use of RW. Stand pivot transfers throughout session with CGA/ MinA d/t decreased coordination with RLE and vc for R hand placement to RW grip. Provided verbal cues for technique throughout. Light lean noted to R side.  Gait Training:  Pt ambulated 10' x1 using RW with CGA/ MinA. Return trip pt is able to complete to 130 ft using RW with CGA and 1.5#aw attached to RLE for improved focus to performance/ motor control/ proprioception. Demonstrated decreased step height/ length with RLE. Also decreased IR with decreased activation around ankle demonstrating passive ER/ eversion at ankle with decreased DF. All allowing for drag of toe to R. Provided vc/ tc for increasing R hip/ knee flexion. Pt is able to perform minimal activation at ankle info DF but requires continuous vc for focus and performance. Does fatigue during session.    Neuromuscular Re-ed: NMR facilitated during session with focus on standing balance, motor control, proprioception. Pt guided in stand pivot with focus on reduced need for AD and provided with CGA/ MinA with vc for increased focus to RLE and positioning.   Initiated RLE toe touches to 4 step and provided with vc of knee up/ toe up for focus to hip/ knee flexion as well as increased DF to reach placement and then  knee up/ heel back to remove foot from step with reduced drag on step. Progressed to coordination challenge with touch to either red or blue called color disc placed on step. Next progressed to continued task with 1.5# aw applied to RLE. Improved smooth movement.   NMR performed for improvements in motor control and coordination, balance, sequencing, judgement, and self confidence/ efficacy in performing all aspects of mobility at highest level of independence.   Patient supine in bed at end of session with brakes locked, bed alarm set, and all needs within reach.   Therapy Documentation Precautions:  Precautions Precautions: Fall Precaution Comments: R hemi Restrictions Weight Bearing Restrictions Per Provider Order: No  Pain: Pain Assessment Pain Scale: 0-10 Pain Score: 0-No pain  Therapy/Group: Individual Therapy  Mliss DELENA Milliner PT, DPT, CSRS 08/20/2023, 12:55 PM

## 2023-08-20 NOTE — Progress Notes (Signed)
 Occupational Therapy Session Note  Patient Details  Name: Adrian [name] MRN: [id number] Date of Birth: [birth year]  Today's Date: 08/20/2023 OT Individual Time: 1400-1445 OT Individual Time Calculation (min): 45 min    Short Term Goals: Week 1:  OT Short Term Goal 1 (Week 1): Pt will be able to pick up dinger foods to feed self with min A OT Short Term Goal 2 (Week 1): Pt will perform sit to stands in ADL without UE support wtih contact guard demonstrating improved balance OT Short Term Goal 3 (Week 1): Pt will don LB clothing including footwear with min A OT Short Term Goal 4 (Week 1): pt will perform toileting with min A for balance  Skilled Therapeutic Interventions/Progress Updates:     Pt received in w/c ready for therapy.  Focus of therapy session on RUE  Neuromuscular Re-Education. Pt taken to gym,  pt worked in both sitting and standing to focus on R shoulder and finger control. Pt used a variety of activities including arch to move rings from side to side,  pulling up on squeegez,  turning bolts for finger dexterity. Pt is exhibiting improved motor control of R shoulder and finger dexterity.  Pt demonstrating improved grip strength with lifting and holding a 2 lb weight and placing it down and picking it up.    Pt returned to room. Pt resting in bed with all needs met. Alarm set and call light in reach.     Therapy Documentation Precautions:  Precautions Precautions: Fall Precaution Comments: R hemi Restrictions Weight Bearing Restrictions Per Provider Order: No    Vital Signs: Therapy Vitals Temp: 98.2 F (36.8 C) Pulse Rate: (!) 50 Resp: 17 BP: 134/79 Oxygen Therapy SpO2: 97 % Pain:  No c/o pain  ADL: ADL Eating: Set up Grooming: Setup Where Assessed-Grooming: Sitting at sink Upper Body Bathing: Supervision/safety Where Assessed-Upper Body Bathing: Shower Lower Body Bathing: Contact guard Where Assessed-Lower Body Bathing: Shower Upper Body Dressing:  Minimal assistance Where Assessed-Upper Body Dressing: Chair Lower Body Dressing: Minimal assistance Where Assessed-Lower Body Dressing: Chair Toileting: Moderate assistance Where Assessed-Toileting: Teacher, Adult Education: Curator Method: Risk Manager: Insurance Underwriter Method: Stand pivot     Therapy/Group: Individual Therapy  Lauris Keepers 08/20/2023, 9:02 AM

## 2023-08-20 NOTE — Plan of Care (Signed)
  Problem: Consults Goal: RH STROKE PATIENT EDUCATION Description: See Patient Education module for education specifics  Outcome: Progressing   Problem: RH BOWEL ELIMINATION Goal: RH STG MANAGE BOWEL WITH ASSISTANCE Description: STG Manage Bowel with mod I Assistance. Outcome: Progressing   Problem: RH SAFETY Goal: RH STG ADHERE TO SAFETY PRECAUTIONS W/ASSISTANCE/DEVICE Description: STG Adhere to Safety Precautions With cues Outcome: Progressing   Problem: RH KNOWLEDGE DEFICIT Goal: RH STG INCREASE KNOWLEDGE OF DIABETES Outcome: Progressing Goal: RH STG INCREASE KNOWLEDGE OF HYPERTENSION Description: Patient manage care re: hypertension at discharge using educational materials independently Outcome: Progressing Goal: RH STG INCREASE KNOWLEGDE OF HYPERLIPIDEMIA Outcome: Progressing Goal: RH STG INCREASE KNOWLEDGE OF STROKE PROPHYLAXIS Outcome: Progressing

## 2023-08-20 NOTE — Progress Notes (Signed)
 Physical Therapy Session Note  Patient Details  Name: Adrian [name] MRN: [id number] Date of Birth: 09/27/1955  Today's Date: 08/20/2023 PT Individual Time: 1300-1330 PT Individual Time Calculation (min): 30 min   Short Term Goals: Week 1:  PT Short Term Goal 1 (Week 1): pt will perform sit to stand with LRAD and supervision PT Short Term Goal 2 (Week 1): pt will perform bed to chair transfer with LRAD and supervision PT Short Term Goal 3 (Week 1): pt will ambulate 50 feet with LRAD and min A  Skilled Therapeutic Interventions/Progress Updates:    Session focused on functional transfers with RW and NMR for balance retraining, RLE activation and coordination, and during gait.   Pt performed bed mobility with supervision to come to EOB. Donned shoes with extra time and set up assist seated EOB using figure 4 technique.  Min assist for sit > stand with RW and stand step transfer to the w/c with cues for eccentric control on stand to sit.   Blocked practice sit <> stands and in standing with minimal UE support focusing on pre-gait activities for focus on stance control and R and then coordination with stepping on R with hip flexion activation. Performed 10 x R hip marches and forwards/backwards stepping and then repeated on the L. Min assist overall for balance. Progressed to gait trials with RW x 30' each x 2 with focus on R foot placement, stance control and full WB especially during turning on the RLE with min assist. Discussed trial of AFO in future session (ran out of time) for improved RLE foot clearance and stance control. No noted rolling of R ankle this session but AFO can assist with stability as well and discussed this with patient.   Therapy Documentation Precautions:  Precautions Precautions: Fall Precaution Comments: R hemi Restrictions Weight Bearing Restrictions Per Provider Order: No  Pain:  Denies pain.    Therapy/Group: Individual Therapy  Adrian [name]  Adrian [name] WENDI Adrian, PT, DPT, CBIS  08/20/2023, 2:10 PM

## 2023-08-20 NOTE — Progress Notes (Signed)
 Patient ID: Adrian [name], male   DOB: 1956/01/12, 68 y.o.   MRN: [id number]  SW received FMLA forms. SW left forms in room.   [id number] called pt ex-wife [name] on above.   Graeme Jude, MSW, LCSW Office: [phone] Cell: [phone] Fax: [phone]

## 2023-08-20 NOTE — Progress Notes (Signed)
 PROGRESS NOTE   Subjective/Complaints:  No issues overnite , discussed HR, no dizziness   ROS- neg CP, SOB, N/V/D, no abd pain or bladder issues   Objective:   No results found.  Recent Labs    08/20/23 0511  WBC 2.8*  HGB 14.9  HCT 41.6  PLT 169    Recent Labs    08/20/23 0511  NA 129*  K 4.2  CL 97*  CO2 24  GLUCOSE 95  BUN 24*  CREATININE 1.01  CALCIUM  9.2     Intake/Output Summary (Last 24 hours) at 08/20/2023 0912 Last data filed at 08/20/2023 0720 Gross per 24 hour  Intake 832 ml  Output 750 ml  Net 82 ml        Physical Exam: Vital Signs Blood pressure 134/79, pulse (!) 58, temperature 98.2 F (36.8 C), resp. rate 17, height 5' 8 (1.727 m), weight 60.5 kg, SpO2 97%.   General: No acute distress, resting comfortably in bed Mood and affect are appropriate Heart: mildly bradycardic, reg rhythm no rubs murmurs or extra sounds Lungs: Clear to auscultation, breathing unlabored, no rales or wheezes Abdomen: Positive bowel sounds, soft nontender to palpation, nondistended Extremities: No clubbing, cyanosis, or edema Skin: No evidence of breakdown, no evidence of rash over exposed surfaces  PRIOR EXAMS: Neurologic: Cranial nerves II through XII intact, motor strength is 5/5 in left and 3- R deltoid, bicep, tricep, grip,5/5 left and 3- right  hip flexor, knee extensors,  1/5 on right 5/5 left ankle dorsiflexor and plantar flexor Sensory exam normal sensation to light touch  in bilateral upper and lower extremities Intact finger to thumb opposition but slow on Right side  Musculoskeletal: Full range of motion in all 4 extremities. No joint swelling   Assessment/Plan: 1. Functional deficits which require 3+ hours per day of interdisciplinary therapy in a comprehensive inpatient rehab setting. Physiatrist is providing close team supervision and 24 hour management of active medical problems listed  below. Physiatrist and rehab team continue to assess barriers to discharge/monitor patient progress toward functional and medical goals  Care Tool:  Bathing    Body parts bathed by patient: Right arm, Chest, Abdomen, Front perineal area, Right upper leg, Buttocks, Left upper leg, Right lower leg, Left lower leg, Face   Body parts bathed by helper: Left arm     Bathing assist Assist Level: Minimal Assistance - Patient > 75%     Upper Body Dressing/Undressing Upper body dressing   What is the patient wearing?: Pull over shirt    Upper body assist Assist Level: Maximal Assistance - Patient 25 - 49%    Lower Body Dressing/Undressing Lower body dressing      What is the patient wearing?: Pants     Lower body assist Assist for lower body dressing: Maximal Assistance - Patient 25 - 49%     Toileting Toileting    Toileting assist Assist for toileting: Moderate Assistance - Patient 50 - 74%     Transfers Chair/bed transfer  Transfers assist     Chair/bed transfer assist level: Minimal Assistance - Patient > 75%     Locomotion Ambulation   Ambulation assist  Assist level: 2 helpers Assistive device:  (B HHA) Max distance: 8 feet   Walk 10 feet activity   Assist     Assist level: 2 helpers     Walk 50 feet activity   Assist Walk 50 feet with 2 turns activity did not occur: Safety/medical concerns (fatigue)         Walk 150 feet activity   Assist Walk 150 feet activity did not occur: Safety/medical concerns (fatigue)         Walk 10 feet on uneven surface  activity   Assist Walk 10 feet on uneven surfaces activity did not occur: Safety/medical concerns         Wheelchair     Assist Is the patient using a wheelchair?: Yes Type of Wheelchair: Manual    Wheelchair assist level: Dependent - Patient 0%      Wheelchair 50 feet with 2 turns activity    Assist        Assist Level: Dependent - Patient 0%    Wheelchair 150 feet activity     Assist      Assist Level: Dependent - Patient 0%   Blood pressure 134/79, pulse (!) 58, temperature 98.2 F (36.8 C), resp. rate 17, height 5' 8 (1.727 m), weight 60.5 kg, SpO2 97%.  Medical Problem List and Plan: 1. Functional deficits secondary to left basal ganglia hemorrhage d/t hypertension and anticoagulation  with subsequent right hemiparesis             -patient may  shower -ELOS/Goals: 12-16 days, goals mod I with PT and OT and independent with SLP -PRAFO while in bed to stretch right heel cord. I suspect he will need an AFO for gait. -Continue CIR 2.  Antithrombotics: -DVT/anticoagulation:  Pharmaceutical: Heparin  5000U q8h             -antiplatelet therapy: none   3. Pain Management: Tylenol  and robaxin  as needed   4. Mood/Behavior/Sleep: LCSW to evaluate and provide emotional support             -antipsychotic agents: n/a   5. Neuropsych/cognition: This patient is capable of making decisions on his own behalf although he appears to have some short term memory deficits   6. Skin/Wound Care: Routine skin care checks   7. Fluids/Electrolytes/Nutrition: Routine Is and Os and follow-up chemistries   8: Hypertension/bradycardia: monitor TID and prn. Fair control at present             -Continue amlodipine  2.5 mg daily -Continue metoprolol  tartrate 50 mg twice daily>> decreased to 25mg  BID on 08/17/23 -Sys BP improved, remains bradycardic on beta blocker  -08/18/23 BPs up a little, but they held his metoprolol  several days d/t bradycardia. Could consider halving dose of metoprolol , but will see how his BPs go the next few days -08/19/23 BP still a little up, HR upper 50s-60s consistently despite several skipped doses of metoprolol -- doubt we need to decrease it now, continue current dose, if bradycardia worsens then could consider lowering it, but might then need to increase amlodipine  for improved BP control Vitals:   08/16/23 1943  08/17/23 0420 08/17/23 1404 08/17/23 2003  BP: (!) 147/78 (!) 150/85 (!) 153/81 (!) 145/69   08/18/23 0442 08/18/23 1449 08/18/23 1927 08/19/23 0621  BP: (!) 141/79 136/77 117/78 (!) 144/86   08/19/23 0813 08/19/23 1404 08/19/23 1937 08/20/23 0523  BP: (!) 142/77 (!) 155/94 131/83 134/79   Will decrease lopressor  to 12.5mg  BID , monitor HR and BP  9: Hyperlipidemia: continue Lipitor  80 mg daily   10: Hypothyroidism: Continue Synthroid  125 mcg every morning   11: Tobacco use: Cessation counseling   12: Atrial fibrillation: Now off Eliquis  due to ICH.  Will follow-up with neurology in 2 weeks to consider aspire trial, restart Eliquis  in 2 weeks if stable and follow-up with cardiology. Continue amiodarone  200mg  daily   13: Chronic leukopenia: Follow-up CBC- stable WBCs    14: Mild thrombocytopenia: Follow-up CBC      LOS: 6 days A FACE TO FACE EVALUATION WAS PERFORMED  Prentice FORBES Compton 08/20/2023, 9:12 AM

## 2023-08-21 DIAGNOSIS — I10 Essential (primary) hypertension: Secondary | ICD-10-CM | POA: Diagnosis not present

## 2023-08-21 DIAGNOSIS — I610 Nontraumatic intracerebral hemorrhage in hemisphere, subcortical: Secondary | ICD-10-CM | POA: Diagnosis not present

## 2023-08-21 DIAGNOSIS — Z720 Tobacco use: Secondary | ICD-10-CM | POA: Diagnosis not present

## 2023-08-21 NOTE — Progress Notes (Signed)
 Weight obtained per MD order.

## 2023-08-21 NOTE — Progress Notes (Addendum)
 Physical Therapy Weekly Progress Note  Patient Details  Name: Adrian [name] MRN: [id number] Date of Birth: 12-Feb-1956  Beginning of progress report period: August 15, 2023 End of progress report period: August 21, 2023  Today's Date: 08/21/2023 PT Individual Time: 1301-1400 PT Individual Time Calculation (min): 59 min   Patient has met 2 of 3 short term goals.  Pt making appropriate progress towards goals and is on track to meet LTG. He completes bed mobility with supervision, sit<>stand transfers with close supervision, and stand<>pivot transfers with CGA/ MinA and RW. He's ambulating longer distances up to >156ft with CGA/ MinA using RW and has shown ability to navigate smaller eight 3inch steps with MinA and 2 hand rails. He continues to be primarily limited by decreased dynamic standing balance, RUE and RLE weakness and coordination deficits, and gait impairments. No family education has occurred yet to prepare for d/c home.    Patient continues to demonstrate the following deficits muscle weakness, decreased cardiorespiratoy endurance, impaired timing and sequencing, unbalanced muscle activation, ataxia, and decreased coordination, decreased attention to right and decreased motor planning, decreased attention and decreased safety awareness, and decreased standing balance, decreased balance strategies, and hemipareisis  and therefore will continue to benefit from skilled PT intervention to increase functional independence with mobility.  Patient progressing toward long term goals..  Continue plan of care.  PT Short Term Goals Week 1:  PT Short Term Goal 1 (Week 1): pt will perform sit to stand with LRAD and supervision PT Short Term Goal 1 - Progress (Week 1): Met PT Short Term Goal 2 (Week 1): pt will perform bed to chair transfer with LRAD and supervision PT Short Term Goal 2 - Progress (Week 1): Progressing toward goal PT Short Term Goal 3 (Week 1): pt will ambulate 50 feet with LRAD  and min A PT Short Term Goal 3 - Progress (Week 1): Met Week 2:  PT Short Term Goal 1 (Week 2): Pt will perform stand pivot transfer with LRAD and supervision PT Short Term Goal 2 (Week 2): Pt will ambulate at least 100 feet using LRAD with light CGA. PT Short Term Goal 3 (Week 2): Pt will navigate at least 4 steps using BHR with overall minA. PT Short Term Goal 4 (Week 2): Pt will improve score on Berg Balance Test by at least the [facility]. PT Short Term Goal 5 (Week 2): Pt will improve quality of gait with ability to spend increased time on RLE in order to advance LLE past RLE consistently.  Skilled Therapeutic Interventions/Progress Updates:  Ambulation/gait training;Discharge planning;Functional mobility training;Psychosocial support;Therapeutic Activities;Visual/perceptual remediation/compensation;Balance/vestibular training;Disease management/prevention;Neuromuscular re-education;Skin care/wound management;Therapeutic Exercise;Wheelchair propulsion/positioning;Cognitive remediation/compensation;DME/adaptive equipment instruction;Pain management;Splinting/orthotics;UE/LE Strength taining/ROM;Community reintegration;Functional electrical stimulation;Patient/family education;Stair training;UE/LE Coordination activities   Patient supine in bed on entrance to room. Patient alert and agreeable to PT session.   Patient with no pain complaint at start of session.  Therapeutic Activity: Bed Mobility: Pt performed supine <> sit with supervision. Min vc/ tc required for technique. Transfers: Pt performed sit<>stand transfers with close supervision and is able to maintain standing balance on rising to no AD. Stand pivot transfers throughout session with MinA for balance and vc for increased focus on R foot positioning during pivot stepping.  Gait Training/ NMR:  To conserve energy and for time, pt brought to main gym via w/c. Positioned in front of steps and guided in RLE toe touches to first step. Is able  to perform with improved quality from previous day and bring toe  to step on initial attempt. Continues to attempt circumduction and hip hike on R to compensate and improve quality of movement. Top of R pelvis blocked to prevent and pt still able to bring RLE to step. Continued vc for knee up/ toe up and knee up/ heel back.   Pt ambulated 30 ft using no AD with MinA. Demonstrated improved ability to bring RLE forward with slight increase in R hip/ knee flexion - less circumduction and sliding foot forward with leg in ER. Provided vc/ tc for maintaining forward toe positioning and DF during swing phase. However, is unable to advance LLE past RLE d/t decreased time spent on RLE. Provided with vc/ tc and light facilitation to weight shift further to R side, however, pt with reduced confidence in RLE despite relating ability to feel RLE and good noted strength.   Neuromuscular Re-ed: NMR facilitated during session with focus on standing balance, motor control. Pt guided in RUE use to place red, green, and blue pinch pins to basketball net placed in high position. Pt is able to place with R hand only and demonstrates increased effort with holding of breath. Guided in inhale prior to attempt and exhale with effort/ reach. Requires vc throughout as pt continues to demo holding of breath. For final 9 pins, instructed to use LUE for 2, then RUE for 1 to complete in order to provide rest for RUE. Pt in seated position to remove pins and instructed to alternate with UE. Noted to breathe normally with use of LUE and holding breath with RUE. Cued to continue to breathe normally with RUE use. Pt continues to hold breath despite vc. Throughout activity, he requires extra time to position RUE and then to utilize pinch or grasp to open pin. But is able to complete with supervision.   NMR performed for improvements in motor control and coordination, balance, sequencing, judgement, and self confidence/ efficacy in performing  all aspects of mobility at highest level of independence.   Patient supine in bed at end of session with brakes locked, bed alarm set, and all needs within reach. Oriented to time and time of final therapy session for day in 60 min.    Therapy Documentation Precautions:  Precautions Precautions: Fall Precaution Comments: R hemi Restrictions Weight Bearing Restrictions Per Provider Order: No  Pain: Pain Assessment Pain Scale: 0-10 Pain Score: 0-No pain Vision/Perception  Vision - History Ability to See in Adequate Light: 0 Adequate Perception Perception: Impaired Preception Impairment Details: Inattention/Neglect;Spatial orientation Perception-Other Comments: R sided UE/LE neglect/inattention UE>LE Praxis Praxis: Impaired Praxis Impairment Details: Motor planning  Mobility: Bed Mobility Bed Mobility: Rolling Right;Rolling Left;Supine to Sit;Sit to Supine Rolling Right: Supervision/verbal cueing Rolling Left: Supervision/Verbal cueing Supine to Sit: Supervision/Verbal cueing Sit to Supine: Supervision/Verbal cueing Transfers Transfers: Sit to Stand;Stand to Sit;Stand Pivot Transfers Sit to Stand: Contact Guard/Touching assist;Supervision/Verbal cueing Stand to Sit: Contact Guard/Touching assist Stand Pivot Transfers: Contact Guard/Touching assist;Minimal Assistance - Patient > 75% Stand Pivot Transfer Details: Verbal cues for precautions/safety;Verbal cues for sequencing;Verbal cues for technique Transfer (Assistive device): Rolling walker Locomotion : Gait Ambulation: Yes Gait Assistance: Contact Guard/Touching assist;Minimal Assistance - Patient > 75% Gait Distance (Feet): 150 Feet Assistive device: Rolling walker Gait Assistance Details: Verbal cues for gait pattern;Verbal cues for precautions/safety;Verbal cues for technique;Verbal cues for sequencing Gait Gait: Yes Gait Pattern: Impaired Gait Pattern: Step-to pattern;Decreased step length - left;Decreased stance  time - right;Decreased dorsiflexion - right;Decreased hip/knee flexion - right;Decreased weight shift to right;Right circumduction;Right foot flat;Right  genu recurvatum;Narrow base of support;Poor foot clearance - right;Lateral trunk lean to right Gait velocity: decreased Stairs / Additional Locomotion Stairs: Yes Stairs Assistance: Minimal Assistance - Patient > 75% Stair Management Technique: Two rails  Trunk/Postural Assessment : Cervical Assessment Cervical Assessment: Within Functional Limits Thoracic Assessment Thoracic Assessment: Within Functional Limits Lumbar Assessment Lumbar Assessment: Within Functional Limits Postural Control Righting Reactions: delayed  Balance: Balance Balance Assessed: Yes Static Sitting Balance Static Sitting - Level of Assistance: 5: Stand by assistance Dynamic Sitting Balance Dynamic Sitting - Level of Assistance: 5: Stand by assistance Static Standing Balance Static Standing - Balance Support: No upper extremity supported;During functional activity Static Standing - Level of Assistance: 4: Min assist;Other (comment) (CGA) Dynamic Standing Balance Dynamic Standing - Level of Assistance: 4: Min assist   Therapy/Group: Individual Therapy  Mliss DELENA Milliner 08/21/2023, 6:14 PM

## 2023-08-21 NOTE — Plan of Care (Signed)
  Problem: RH BOWEL ELIMINATION Goal: RH STG MANAGE BOWEL WITH ASSISTANCE Description: STG Manage Bowel with mod I Assistance. Outcome: Progressing   Problem: RH SAFETY Goal: RH STG ADHERE TO SAFETY PRECAUTIONS W/ASSISTANCE/DEVICE Description: STG Adhere to Safety Precautions With cues Outcome: Progressing   Problem: RH KNOWLEDGE DEFICIT Goal: RH STG INCREASE KNOWLEDGE OF DIABETES Outcome: Progressing

## 2023-08-21 NOTE — Progress Notes (Signed)
 Occupational Therapy Weekly Progress Note  Patient Details  Name: Adrian [name] MRN: [id number] Date of Birth: 10-20-1955  Beginning of progress report period: August 15, 2023 End of progress report period: August 21, 2023  Today's Date: 08/21/2023 OT Individual Time: 0930-1015 OT Individual Time Calculation (min): 45 min    Patient has met 4 of 4 short term goals.  Pt is progressing well with his balance, transfers, and RUE functional use.  Pt is very motivated and puts in excellent effort.    Patient continues to demonstrate the following deficits: decreased cardiorespiratoy endurance, unbalanced muscle activation and decreased coordination, decreased attention to right, and decreased standing balance, hemiplegia, and decreased balance strategies and therefore will continue to benefit from skilled OT intervention to enhance overall performance with BADL and iADL.  Patient progressing toward long term goals..  Continue plan of care.  OT Short Term Goals Week 1:  OT Short Term Goal 1 (Week 1): Pt will be able to pick up finger foods to feed self with min A OT Short Term Goal 1 - Progress (Week 1): Met OT Short Term Goal 2 (Week 1): Pt will perform sit to stands in ADL without UE support wtih contact guard demonstrating improved balance OT Short Term Goal 2 - Progress (Week 1): Met OT Short Term Goal 3 (Week 1): Pt will don LB clothing including footwear with min A OT Short Term Goal 3 - Progress (Week 1): Met OT Short Term Goal 4 (Week 1): pt will perform toileting with min A for balance OT Short Term Goal 4 - Progress (Week 1): Met Week 2:  OT Short Term Goal 1 (Week 2): pt will demonstrate improved balance to don pants with supervision only. OT Short Term Goal 2 (Week 2): Pt will demonstrate improved R hand FMC to cut meat without A. OT Short Term Goal 3 (Week 2): Pt will demonstrate improved R shoulder AROM to be able to don a shirt without needing to use hemidressing  techniques.  Skilled Therapeutic Interventions/Progress Updates:    Pt received in w/c requesting a shower.  He sat at sink to complete oral care with cue to try to use R hand as much as possible.  Pt held toothbrush in hand as he applied paste and then used R hand to partially brush teeth,then switched to his L hand for thoroughness.    Stand pivot from wc to shower bench CGA. Pt able to use R hand to squeeze out soap from bottle today. Used R hand as an active assist to wash body (back of hips when standing and legs also).  CGA when standing due to R lean.   Pt dried off and transferred to w/c to dress. Due to the high seat had pt sit on EOB so he was at an improved angle to enable him to don his socks using B hands with legs in figure 4 position.  Pt able to do this task but needed A to get socks over long nails.   Pt donned shoes with elastic laces.  He is improving with sit to stand skills but continues to need CGA when doing ADLs in standing.    Pt then worked on a RUE NMR coordination task using a spatula to flip disks (like pancakes) to work on grip and forearm rotation.   Pt needed some hand over hand guiding initially, and then was able to carry out task.   Had pt practice holding his remote and flipping it over and  back.   Pt opted to lay back in bed and rest. Pt resting in bed with all needs met. Alarm set and call light in reach.     Therapy Documentation Precautions:  Precautions Precautions: Fall Precaution Comments: R hemi Restrictions Weight Bearing Restrictions Per Provider Order: No    Pain:  No c/o pain  ADL: ADL Eating: Set up Grooming: Setup Where Assessed-Grooming: Sitting at sink Upper Body Bathing: Supervision/safety Where Assessed-Upper Body Bathing: Shower Lower Body Bathing: Contact guard Where Assessed-Lower Body Bathing: Shower Upper Body Dressing: Supervision/safety Where Assessed-Upper Body Dressing: Chair Lower Body Dressing: Contact guard Where  Assessed-Lower Body Dressing: Chair Toileting: Contact guard Where Assessed-Toileting: Teacher, Adult Education: Curator Method: Risk Manager: Insurance Underwriter Method: Stand pivot   Therapy/Group: Individual Therapy  Amiyrah Lamere 08/21/2023, 12:24 PM

## 2023-08-21 NOTE — Progress Notes (Signed)
 Physical Therapy Session Note  Patient Details  Name: Adrian [name] MRN: [id number] Date of Birth: 05-12-1956  Today's Date: 08/21/2023 PT Individual Time: 1032-1120 PT Individual Time Calculation (min): 48 min   Short Term Goals: Week 1:  PT Short Term Goal 1 (Week 1): pt will perform sit to stand with LRAD and supervision PT Short Term Goal 2 (Week 1): pt will perform bed to chair transfer with LRAD and supervision PT Short Term Goal 3 (Week 1): pt will ambulate 50 feet with LRAD and min A  Skilled Therapeutic Interventions/Progress Updates:  Patient supine in bed on entrance to room. Patient alert and agreeable to PT session.   Patient with no pain complaint at start of session.  Therapeutic Activity: Bed Mobility: Pt performed supine <> sit with supervision. VC/ tc required for effort, technique. Transfers: Pt performed sit<>stand with focus on maintaining balance in stance and is improving to supervision. Stand pivot transfers throughout session with MinA and requires hand hold to something in order to steady self despite having balance.  Provided vc/ tc for R foot positioning during pivot stepping and increasing step height.  Gait Training:  Pt ambulated >150 ft from room to far end of day room using RW with CGA/ intermittent light MinA throughout. Demonstrated significant decrease in pace with difficulty advancing Rle in neutral leg position. Pt tending to slide toe of R foot with RLE in externally rotated position at hip and knee.  Provided vc/ tc for increasing hip/ knee flexion in order to maintain movement in sagittal plane. Pt can only improve minimally with attempt.   Wheelchair Mobility:  Pt propelled wheelchair ~60 feet with supervision but requires vc for equalizing effort in BUE. Is able to manage straight path with decreasing difficulty and manages turn to R more easily than turn to L. Maneuvers all large obstacles in hallway.   Neuromuscular Re-ed: NMR facilitated  during session with focus on motor control and coordination. In seated position at [facility], pt guided in maintaining straight RLE with heel on floor and df/ PF for R toe. Is able to initiate movement and requires ModA to complete in both directions. Has increased AROM at end of exercise. Then guided in: straight leg IR with focus on movement at hip LAQs HS curls Marches  All performed within AROM and minimal AAROM provided to complete pain-free range  NMR performed for improvements in motor control and coordination, balance, sequencing, judgement, and self confidence/ efficacy in performing all aspects of mobility at highest level of independence.   Patient supine in bed at end of session with brakes locked, bed alarm set, and all needs within reach.   Therapy Documentation Precautions:  Precautions Precautions: Fall Precaution Comments: R hemi Restrictions Weight Bearing Restrictions Per Provider Order: No  Pain:  No pain related this session.    Therapy/Group: Individual Therapy  Mliss DELENA Milliner PT, DPT, CSRS 08/21/2023, 1:06 PM

## 2023-08-21 NOTE — Progress Notes (Signed)
 PROGRESS NOTE   Subjective/Complaints:  Had BM this am , no c/o of pain Pt frustrated by inability to move toes on Right side ROS- neg CP, SOB, N/V/D, no abd pain or bladder issues   Objective:   No results found.  Recent Labs    08/20/23 0511  WBC 2.8*  HGB 14.9  HCT 41.6  PLT 169    Recent Labs    08/20/23 0511  NA 129*  K 4.2  CL 97*  CO2 24  GLUCOSE 95  BUN 24*  CREATININE 1.01  CALCIUM  9.2     Intake/Output Summary (Last 24 hours) at 08/21/2023 0906 Last data filed at 08/21/2023 0800 Gross per 24 hour  Intake 922 ml  Output 1100 ml  Net -178 ml        Physical Exam: Vital Signs Blood pressure 134/75, pulse 68, temperature 98.6 F (37 C), resp. rate 18, height 5' 8 (1.727 m), weight 60.5 kg, SpO2 97%.   General: No acute distress, resting comfortably in bed Mood and affect are appropriate Heart: mildly bradycardic, reg rhythm no rubs murmurs or extra sounds Lungs: Clear to auscultation, breathing unlabored, no rales or wheezes Abdomen: Positive bowel sounds, soft nontender to palpation, nondistended Extremities: No clubbing, cyanosis, or edema Skin: No evidence of breakdown, no evidence of rash over exposed surfaces  PRIOR EXAMS: Neurologic: Cranial nerves II through XII intact, motor strength is 5/5 in left and 3- R deltoid, bicep, tricep, grip,5/5 left and 3- right  hip flexor, knee extensors,  1/5 on right 5/5 left ankle dorsiflexor and plantar flexor Sensory exam normal sensation to light touch  in bilateral upper and lower extremities Intact finger to thumb opposition but slow on Right side  Musculoskeletal: Full range of motion in all 4 extremities. No joint swelling   Assessment/Plan: 1. Functional deficits which require 3+ hours per day of interdisciplinary therapy in a comprehensive inpatient rehab setting. Physiatrist is providing close team supervision and 24 hour management of  active medical problems listed below. Physiatrist and rehab team continue to assess barriers to discharge/monitor patient progress toward functional and medical goals  Care Tool:  Bathing    Body parts bathed by patient: Right arm, Chest, Abdomen, Front perineal area, Right upper leg, Buttocks, Left upper leg, Right lower leg, Left lower leg, Face   Body parts bathed by helper: Left arm     Bathing assist Assist Level: Minimal Assistance - Patient > 75%     Upper Body Dressing/Undressing Upper body dressing   What is the patient wearing?: Pull over shirt    Upper body assist Assist Level: Maximal Assistance - Patient 25 - 49%    Lower Body Dressing/Undressing Lower body dressing      What is the patient wearing?: Pants     Lower body assist Assist for lower body dressing: Maximal Assistance - Patient 25 - 49%     Toileting Toileting    Toileting assist Assist for toileting: Moderate Assistance - Patient 50 - 74%     Transfers Chair/bed transfer  Transfers assist     Chair/bed transfer assist level: Minimal Assistance - Patient > 75%     Locomotion  Ambulation   Ambulation assist      Assist level: Minimal Assistance - Patient > 75% Assistive device: Walker-rolling Max distance: 30'   Walk 10 feet activity   Assist     Assist level: Minimal Assistance - Patient > 75% Assistive device: Walker-rolling   Walk 50 feet activity   Assist Walk 50 feet with 2 turns activity did not occur: Safety/medical concerns (fatigue)         Walk 150 feet activity   Assist Walk 150 feet activity did not occur: Safety/medical concerns (fatigue)         Walk 10 feet on uneven surface  activity   Assist Walk 10 feet on uneven surfaces activity did not occur: Safety/medical concerns         Wheelchair     Assist Is the patient using a wheelchair?: Yes Type of Wheelchair: Manual    Wheelchair assist level: Dependent - Patient 0%       Wheelchair 50 feet with 2 turns activity    Assist        Assist Level: Dependent - Patient 0%   Wheelchair 150 feet activity     Assist      Assist Level: Dependent - Patient 0%   Blood pressure 134/75, pulse 68, temperature 98.6 F (37 C), resp. rate 18, height 5' 8 (1.727 m), weight 60.5 kg, SpO2 97%.  Medical Problem List and Plan: 1. Functional deficits secondary to left basal ganglia hemorrhage d/t hypertension and anticoagulation  with subsequent right hemiparesis             -patient may  shower -ELOS/Goals: 12-16 days, goals mod I with PT and OT and independent with SLP -PRAFO while in bed to stretch right heel cord. I suspect he will need an AFO for gait. -Continue CIR, tea conf in am  2.  Antithrombotics: -DVT/anticoagulation:  Pharmaceutical: Heparin  5000U q8h             -antiplatelet therapy: none   3. Pain Management: Tylenol  and robaxin  as needed   4. Mood/Behavior/Sleep: LCSW to evaluate and provide emotional support             -antipsychotic agents: n/a   5. Neuropsych/cognition: This patient is capable of making decisions on his own behalf although he appears to have some short term memory deficits   6. Skin/Wound Care: Routine skin care checks   7. Fluids/Electrolytes/Nutrition: Routine Is and Os and follow-up chemistries   8: Hypertension/bradycardia: monitor TID and prn. Fair control at present             -Continue amlodipine  2.5 mg daily -Continue metoprolol  tartrate 50 mg twice daily>> decreased to 25mg  BID on 08/17/23 -Sys BP improved, remains bradycardic on beta blocker  -08/18/23 BPs up a little, but they held his metoprolol  several days d/t bradycardia. Could consider halving dose of metoprolol , but will see how his BPs go the next few days -08/19/23 BP still a little up, HR upper 50s-60s consistently despite several skipped doses of metoprolol -- doubt we need to decrease it now, continue current dose, if bradycardia worsens then  could consider lowering it, but might then need to increase amlodipine  for improved BP control Vitals:   08/17/23 2003 08/18/23 0442 08/18/23 1449 08/18/23 1927  BP: (!) 145/69 (!) 141/79 136/77 117/78   08/19/23 0621 08/19/23 0813 08/19/23 1404 08/19/23 1937  BP: (!) 144/86 (!) 142/77 (!) 155/94 131/83   08/20/23 0523 08/20/23 1349 08/20/23 1932 08/21/23 0532  BP:  134/79 118/73 (!) 143/77 134/75   Will decrease lopressor  to 12.5mg  BID , monitor HR and BP- HR in 60s this am monitor  9: Hyperlipidemia: continue Lipitor  80 mg daily   10: Hypothyroidism: Continue Synthroid  125 mcg every morning   11: Tobacco use: Cessation counseling   12: Atrial fibrillation: Now off Eliquis  due to ICH.  Will follow-up with neurology in 2 weeks to consider aspire trial, restart Eliquis  in 2 weeks if stable and follow-up with cardiology. Continue amiodarone  200mg  daily   13: Chronic leukopenia: Follow-up CBC- stable WBCs    14: Mild thrombocytopenia: Follow-up CBC      LOS: 7 days A FACE TO FACE EVALUATION WAS PERFORMED  Prentice FORBES Compton 08/21/2023, 9:06 AM

## 2023-08-21 NOTE — Progress Notes (Signed)
 Physical Therapy Session Note  Patient Details  Name: Adrian [name] MRN: [id number] Date of Birth: 1955/10/01  Today's Date: 08/21/2023 PT Individual Time: 1430-1500 PT Individual Time Calculation (min): 30 min   Short Term Goals: Week 1:  PT Short Term Goal 1 (Week 1): pt will perform sit to stand with LRAD and supervision PT Short Term Goal 2 (Week 1): pt will perform bed to chair transfer with LRAD and supervision PT Short Term Goal 3 (Week 1): pt will ambulate 50 feet with LRAD and min A   Skilled Therapeutic Interventions/Progress Updates:    Session focused on NMR to address transitional movements (sit <> stands), WB through RLE, coordination of RLE, and overall balance retraining.  Blocked practice sit <> stands without UE support x 10 reps with CGA and tactile input to RLE for activation Blocked practice sit <> stand with LLE on 2 block x 10 reps with minimal UE support progressing to no UE support x 10 reps (min assist) with tactile input to RLE Toe taps with LLE to 4 step for stance control on R  x 10 reps Toe taps with RLE to 4 step for hip flexion activation with cues for technique x 10 reps (noted hip weakness with ER).  Performed stand step transfers with min assist in and out of bed without device with focus on increasing WB through RLE and focusing on stepping RLE vs pivot on this foot. Supervision for bed mobility. All needs in reach.   Therapy Documentation Precautions:  Precautions Precautions: Fall Precaution Comments: R hemi Restrictions Weight Bearing Restrictions Per Provider Order: No  Pain:  Denies pain.    Therapy/Group: Individual Therapy  Elnor Pizza Sherrell Pizza WENDI Elnor, PT, DPT, CBIS  08/21/2023, 3:04 PM

## 2023-08-21 NOTE — Progress Notes (Signed)
 Speech Language Pathology Daily Session Note  Patient Details  Name: Adrian [name] MRN: [id number] Date of Birth: [birth year]  Today's Date: 08/21/2023 SLP Individual Time: 0800-0859 SLP Individual Time Calculation (min): 59 min  Short Term Goals: Week 1: SLP Short Term Goal 1 (Week 1): Patient will participate in MBS to assess oropharyngeal swallow function SLP Short Term Goal 2 (Week 1): Patient will increase speech intelligibility to 90% during conversation with use of strategies given min multimodal A SLP Short Term Goal 3 (Week 1): Patient will utilize compensatory strategies to increase word finding during abstract thoughts given min multimodal A SLP Short Term Goal 4 (Week 1): Patient will demonstrate problem solving during mildly complex functional situations given min multimodal A SLP Short Term Goal 5 (Week 1): Patient will recall and utilize memory compensatory aids given min multimodal A  Skilled Therapeutic Interventions: Skilled therapy session focused on dysphagia and communication goals. SLP faciliated session by encouraging patient to verbalize swallowing strategies. Patient recalled need for double swallow and intermittent throat clear independently to reduce pharyngeal residuals. SLP then educated patient on pharyngeal strengthening exercises including the chin tuck against resistance, masako and effortful swallow. Patient completed each with supervision A x10 repetitions. SLP addressed communication goals through review of description strategy and encouraged use during picture description task and semantic feature anaylsis webs. Patient utilized word finding strategies, as needed with minA. Patient left in bed with alarm set and call bell in reach. Continue POC.   Pain None reported   Therapy/Group: Individual Therapy  Jamaia Brum M.A., CF-SLP 08/21/2023, 7:40 AM

## 2023-08-22 DIAGNOSIS — I610 Nontraumatic intracerebral hemorrhage in hemisphere, subcortical: Secondary | ICD-10-CM | POA: Diagnosis not present

## 2023-08-22 DIAGNOSIS — Z720 Tobacco use: Secondary | ICD-10-CM | POA: Diagnosis not present

## 2023-08-22 DIAGNOSIS — I10 Essential (primary) hypertension: Secondary | ICD-10-CM | POA: Diagnosis not present

## 2023-08-22 NOTE — Plan of Care (Signed)
  Problem: Consults Goal: RH STROKE PATIENT EDUCATION Description: See Patient Education module for education specifics  Outcome: Progressing   Problem: RH BOWEL ELIMINATION Goal: RH STG MANAGE BOWEL WITH ASSISTANCE Description: STG Manage Bowel with mod I Assistance. Outcome: Progressing   Problem: RH SAFETY Goal: RH STG ADHERE TO SAFETY PRECAUTIONS W/ASSISTANCE/DEVICE Description: STG Adhere to Safety Precautions With cues Outcome: Progressing   Problem: RH KNOWLEDGE DEFICIT Goal: RH STG INCREASE KNOWLEDGE OF DIABETES Outcome: Progressing Goal: RH STG INCREASE KNOWLEDGE OF HYPERTENSION Description: Patient manage care re: hypertension at discharge using educational materials independently Outcome: Progressing Goal: RH STG INCREASE KNOWLEGDE OF HYPERLIPIDEMIA Outcome: Progressing Goal: RH STG INCREASE KNOWLEDGE OF STROKE PROPHYLAXIS Outcome: Progressing

## 2023-08-22 NOTE — Progress Notes (Signed)
 Patient ID: Adrian [name], male   DOB: 22-Mar-1956, 68 y.o.   MRN: [id number]  SW met with pt and pt ex-wife Kennie Peach to provide updates from team conference, inform on d/c date 1/29, and discuss family edu. She reports they will likely discharge to his home and she will come to his home at night. He will also likely have a neighbor and his friend that can come to the home as well. If discharging to his home, will need a TTB due to him having a tub shower. Fam edu scheduled for Friday (1/24) 1pm-4pm. SW suggested pt friend's that will help to come as well. SW will confirm if outpatient therapies remains team recommendation.   Norval Been, MSW, LCSW Office: [phone] Cell: [phone] Fax: [phone]

## 2023-08-22 NOTE — Plan of Care (Signed)
  Problem: RH BOWEL ELIMINATION Goal: RH STG MANAGE BOWEL WITH ASSISTANCE Description: STG Manage Bowel with mod I Assistance. Outcome: Progressing   Problem: RH SAFETY Goal: RH STG ADHERE TO SAFETY PRECAUTIONS W/ASSISTANCE/DEVICE Description: STG Adhere to Safety Precautions With cues Outcome: Progressing   Problem: RH KNOWLEDGE DEFICIT Goal: RH STG INCREASE KNOWLEDGE OF DIABETES Outcome: Progressing

## 2023-08-22 NOTE — Progress Notes (Addendum)
 PROGRESS NOTE   Subjective/Complaints:  No issues overnite , poor sleep, pt states he has restless legs   ROS- neg CP, SOB, N/V/D, no abd pain or bladder issues   Objective:   No results found.  Recent Labs    08/20/23 0511  WBC 2.8*  HGB 14.9  HCT 41.6  PLT 169    Recent Labs    08/20/23 0511  NA 129*  K 4.2  CL 97*  CO2 24  GLUCOSE 95  BUN 24*  CREATININE 1.01  CALCIUM  9.2     Intake/Output Summary (Last 24 hours) at 08/22/2023 0827 Last data filed at 08/22/2023 0754 Gross per 24 hour  Intake 720 ml  Output 300 ml  Net 420 ml        Physical Exam: Vital Signs Blood pressure 138/72, pulse 60, temperature (!) 97.3 F (36.3 C), resp. rate 17, height 5\' 8"  (1.727 m), weight 59.4 kg, SpO2 98%.   General: No acute distress, resting comfortably in bed Mood and affect are appropriate Heart: mildly bradycardic, reg rhythm no rubs murmurs or extra sounds Lungs: Clear to auscultation, breathing unlabored, no rales or wheezes Abdomen: Positive bowel sounds, soft nontender to palpation, nondistended Extremities: No clubbing, cyanosis, or edema Skin: No evidence of breakdown, no evidence of rash over exposed surfaces  PRIOR EXAMS: Neurologic: Cranial nerves II through XII intact, motor strength is 5/5 in left and 3- R deltoid, bicep, tricep, grip,5/5 left and 3- right  hip flexor, knee extensors,  1/5 on right 5/5 left ankle dorsiflexor and plantar flexor Sensory exam normal sensation to light touch  in bilateral upper and lower extremities Intact finger to thumb opposition but slow on Right side  Musculoskeletal: Full range of motion in all 4 extremities. No joint swelling   Assessment/Plan: 1. Functional deficits which require 3+ hours per day of interdisciplinary therapy in a comprehensive inpatient rehab setting. Physiatrist is providing close team supervision and 24 hour management of active medical  problems listed below. Physiatrist and rehab team continue to assess barriers to discharge/monitor patient progress toward functional and medical goals  Care Tool:  Bathing    Body parts bathed by patient: Right arm, Chest, Abdomen, Front perineal area, Right upper leg, Buttocks, Left upper leg, Right lower leg, Left lower leg, Face, Left arm   Body parts bathed by helper: Left arm     Bathing assist Assist Level: Contact Guard/Touching assist     Upper Body Dressing/Undressing Upper body dressing   What is the patient wearing?: Pull over shirt    Upper body assist Assist Level: Supervision/Verbal cueing    Lower Body Dressing/Undressing Lower body dressing      What is the patient wearing?: Pants     Lower body assist Assist for lower body dressing: Minimal Assistance - Patient > 75%     Toileting Toileting    Toileting assist Assist for toileting: Contact Guard/Touching assist     Transfers Chair/bed transfer  Transfers assist     Chair/bed transfer assist level: Minimal Assistance - Patient > 75%     Locomotion Ambulation   Ambulation assist      Assist level: Contact Guard/Touching assist  Assistive device: Walker-rolling Max distance: 150 ft   Walk 10 feet activity   Assist     Assist level: Minimal Assistance - Patient > 75% Assistive device: No Device   Walk 50 feet activity   Assist Walk 50 feet with 2 turns activity did not occur: Safety/medical concerns (fatigue)  Assist level: Contact Guard/Touching assist Assistive device: Walker-rolling    Walk 150 feet activity   Assist Walk 150 feet activity did not occur: Safety/medical concerns (fatigue)  Assist level: Minimal Assistance - Patient > 75% Assistive device: Walker-rolling    Walk 10 feet on uneven surface  activity   Assist Walk 10 feet on uneven surfaces activity did not occur: Safety/medical concerns         Wheelchair     Assist Is the patient using a  wheelchair?: Yes Type of Wheelchair: Manual    Wheelchair assist level: Supervision/Verbal cueing Max wheelchair distance: 50 ft    Wheelchair 50 feet with 2 turns activity    Assist        Assist Level: Supervision/Verbal cueing   Wheelchair 150 feet activity     Assist      Assist Level: Moderate Assistance - Patient 50 - 74%   Blood pressure 138/72, pulse 60, temperature (!) 97.3 F (36.3 C), resp. rate 17, height 5\' 8"  (1.727 m), weight 59.4 kg, SpO2 98%.  Medical Problem List and Plan: 1. Functional deficits secondary to left basal ganglia hemorrhage d/t hypertension and anticoagulation  with subsequent right hemiparesis             -patient may  shower -ELOS/Goals: 12-16 days, goals mod I with PT and OT and independent with SLP -PRAFO while in bed to stretch right heel cord. I suspect he will need an AFO for gait. -Continue CIR, Team conference today please see physician documentation under team conference tab, met with team  to discuss problems,progress, and goals. Formulized individual treatment plan based on medical history, underlying problem and comorbidities.  2.  Antithrombotics: -DVT/anticoagulation:  Pharmaceutical: Heparin  5000U q8h- amb distance 125'             -antiplatelet therapy: none   3. Pain Management: Tylenol  and robaxin  as needed   4. Mood/Behavior/Sleep: LCSW to evaluate and provide emotional support             -antipsychotic agents: n/a   5. Neuropsych/cognition: This patient is capable of making decisions on his own behalf although he appears to have some short term memory deficits   6. Skin/Wound Care: Routine skin care checks   7. Fluids/Electrolytes/Nutrition: Routine Is and Os and follow-up chemistries   8: Hypertension/bradycardia: monitor TID and prn. Fair control at present             -Continue amlodipine  2.5 mg daily -Continue metoprolol  tartrate 50 mg twice daily>> decreased to 25mg  BID on 08/17/23 -Sys BP improved,  remains bradycardic on beta blocker  -08/18/23 BPs up a little, but they held his metoprolol  several days d/t bradycardia. Could consider halving dose of metoprolol , but will see how his BPs go the next few days -08/19/23 BP still a little up, HR upper 50s-60s consistently despite several skipped doses of metoprolol -- doubt we need to decrease it now, continue current dose, if bradycardia worsens then could consider lowering it, but might then need to increase amlodipine  for improved BP control Vitals:   08/18/23 1927 08/19/23 0621 08/19/23 0813 08/19/23 1404  BP: 117/78 (!) 144/86 (!) 142/77 (!) 155/94  08/19/23 1937 08/20/23 0523 08/20/23 1349 08/20/23 1932  BP: 131/83 134/79 118/73 (!) 143/77   08/21/23 0532 08/21/23 1409 08/21/23 2003 08/22/23 0441  BP: 134/75 (!) 150/76 125/77 138/72   Will decrease lopressor  to 12.5mg  BID , monitor HR and BP- HR in 60s this am monitor  9: Hyperlipidemia: continue Lipitor  80 mg daily   10: Hypothyroidism: Continue Synthroid  125 mcg every morning   11: Tobacco use: Cessation counseling   12: Atrial fibrillation: Now off Eliquis  due to ICH.  Will follow-up with neurology in 2 weeks to consider aspire trial, restart Eliquis  in 2 weeks if stable and follow-up with cardiology. Continue amiodarone  200mg  daily   13: Chronic leukopenia: Follow-up CBC- stable WBCs    14: Mild thrombocytopenia: Follow-up CBC      LOS: 8 days A FACE TO FACE EVALUATION WAS PERFORMED  Genetta Kenning 08/22/2023, 8:27 AM

## 2023-08-22 NOTE — Patient Care Conference (Signed)
Inpatient RehabilitationTeam Conference and Plan of Care Update Date: 08/22/2023   Time: 1006 am    Patient Name: Adrian [name]      Medical Record Number: [id number]  Date of Birth: Jan 23, 1956 Sex: Male         Room/Bed: 4W25C/4W25C-01 Payor Info: Payor: [employer]UE [employer]UE [employer] / Plan: BCBS COMM PPO / Product Type: *No Product type* /    Admit Date/Time:  08/14/2023  4:52 PM  Primary Diagnosis:  ICH (intracerebral hemorrhage) [facility])  Hospital Problems: Principal Problem:   ICH (intracerebral hemorrhage) [facility])    Expected Discharge Date: Expected Discharge Date: 09/05/23  Team Members Present: Physician leading conference: Dr. Claudette Laws Social Worker Present: Cecile Sheerer, LCSWA Nurse Present: Chana Bode, RN;Karen Trilby Drummer, RN PT Present: Ralph Leyden, PT OT Present: Primitivo Gauze, OT SLP Present: Everardo Pacific, SLP PPS Coordinator present : Edson Snowball, PT     Current Status/Progress Goal Weekly Team Focus  Bowel/Bladder   Continent of bowel/bladder. LBM 1/14   To remain continent of bowel/bladder with Mod I   Assess bowel/bladder function q shift and as needed    Swallow/Nutrition/ Hydration   reg/thin   modi  use of strategies and pharyngeal strengthening exercises    ADL's   min A overall with developing use of RUE, using hand as an active assist   supervision with LB dressing, bathing, ; mod I with toileting and toilet transfer   ADL training, balance, RUE NMR, right side awareness,pt education    Mobility   Bed mobility = supervision, sit<>stand = supervision/ CGA, stand pivot = CGA/ MinA, ambulation = MinA for balance, RLE advancement with floor clearance, ataxia in RUE   Mod I for bed mobility, transfers, and w/c mobility, supervision for ambulation, MinA for stairs  Barriers: motor control on R hemibody, RUE ataxia, decreased confidence /// Work on: R sided NMR, standing balance, RLE/ UE motor control, improving quality of  gait, family educ    Communication   speech intelligibilty goal met, minA for word finding   supervision   continue to address word finding through semantic feature analysis    Safety/Cognition/ Behavioral Observations  supervision-minA   supervision A   continue to target memory and mildly complex, functional word finding    Pain   [place] of right leg pain, Tylenol PRN on board   To be free of pain   Assess and treat pain q shift and as needed    Skin   Bruises to abd due to Hepain injection   Skin to be free of infection/breakdown with Mod I  Assess skin q shift and as needed      Discharge Planning:  Pt will discharge to home with ex-wife. SW will confirm there are no barriers to discharge.   Team Discussion: Patient was admitted post subcortical ICH with problem solving deficits. Blood pressure fluctuations doing ok and no ETOH withdrawal symptoms noted. Patient on target to meet rehab goals: yes, currently needs occasional CGA with ADLs. Trouble with ambulation but does well with active movement in the shoulder. Working on [employer] operational officer.    *See Care Plan and progress notes for long and short-term goals.   Revisions to Treatment Plan:  Semantic activities   Teaching Needs: Safety, medications, transfers, toileting , etc.  Current Barriers to Discharge: N/a  Possible Resolutions to Barriers: Family education. Out patient follow-up      Medical Summary Current Status: afib rate controlled , BP with lability, occ pain R  side  Barriers to Discharge: Uncontrolled Hypertension   Possible Resolutions to [employer] Focus: manage BP, pain   Continued Need for Acute Rehabilitation Level of Care: The patient requires daily medical management by a physician with specialized training in physical medicine and rehabilitation for the following reasons: Direction of a multidisciplinary physical rehabilitation program to maximize functional independence : Yes Medical  management of patient stability for increased activity during participation in an intensive rehabilitation regime.: Yes Analysis of laboratory values and/or radiology reports with any subsequent need for medication adjustment and/or medical intervention. : Yes   I attest that I was present, lead the team conference, and concur with the assessment and plan of the team.   Gwenyth Allegra 08/22/2023, 3:21 PM

## 2023-08-22 NOTE — Progress Notes (Signed)
 Occupational Therapy Session Note  Patient Details  Name: Adrian [name] MRN: [id number] Date of Birth: 10-24-1955  Today's Date: 08/22/2023 OT Individual Time: 8295-6213 and 1130-1200 OT Individual Time Calculation (min): 45 min and 30 min   Short Term Goals: Week 2:  OT Short Term Goal 1 (Week 2): pt will demonstrate improved balance to don pants with supervision only. OT Short Term Goal 2 (Week 2): Pt will demonstrate improved R hand FMC to cut meat without A. OT Short Term Goal 3 (Week 2): Pt will demonstrate improved R shoulder AROM to be able to don a shirt without needing to use hemidressing techniques.  Skilled Therapeutic Interventions/Progress Updates:    Visit 1: Pain: no c/o pain  Pt sat to EOB and donned socks and shoes with no A using R hand actively.  Stepped with RW to sink.  Stood at sink to brush teeth, did lean hips against sink for support.  Used R hand as an active assist but continues to need to use L hand to brush teeth adequately. Used RW to ambulate  from sink to w/c out in the hallway with improved control of R foot and min A for balance.  Pt taken to dayroom gym.  He worked on  R shoulder AROM with dynamic movements to reach arms overhead -holding arms out at 90 degree abduction with forearm/pronation/ supination  -repeated exercises with arm in front held at 90 degrees  FMC  -scooping small pegs up with plastic spoon and transferring to a cup using wrist rotation -picking up and placing larger pegs in foam board without difficulty -needs cues to relax shoulder as he tends to use a shoulder hike using his upper traps to compensate with lifting arm -tip pinch prehension and finger coordination with twirling and rotation pen, pt with difficulty holding fingers in place with holding tip pinch -attempted writing, pt can somewhat legibly write his name but has limited ability to hold prehension and maintain O shape -recommended in his room he practice tip pinch  prehension with putty and rotating red foam tube  Pt returned to room and opted to rest in bed. Pt resting in bed with all needs met. Alarm set and call light in reach.    Visit 2: Pain: no c/o pain  Pt received in bed. Had pt work in supine for R arm strengthening to avoid shoulder hike.  Using red theraband pt completed single arm exercises of lat pull downs,  PNF D1 and 2, tricep presses.  Bilateral hand holds with pulling band apart with arms straight and then reaching band slightly above head.   Band pulls for external rotation with upper arms next to torso.   Used band around R thigh for resisted clam shell in supine with knees bent 20x , resisted hip adduction 20x, and then 20 bridges.    Pt opted to sit in wc for lunch.  Transferred to wc with all needs met.  Belt alarm on.   Call light in reach.   Therapy Documentation Precautions:  Precautions Precautions: Fall Precaution Comments: R hemi, mild ataxia Restrictions Weight Bearing Restrictions Per Provider Order: No    Vital Signs: Therapy Vitals Temp: (!) 97.3 F (36.3 C) Pulse Rate: 60 Resp: 17 BP: 138/72 Oxygen Therapy SpO2: 98 %  ADL: ADL Eating: Set up Grooming: Setup Where Assessed-Grooming: Sitting at sink Upper Body Bathing: Supervision/safety Where Assessed-Upper Body Bathing: Shower Lower Body Bathing: Contact guard Where Assessed-Lower Body Bathing: Shower Upper Body Dressing: Supervision/safety  Where Assessed-Upper Body Dressing: Chair Lower Body Dressing: Contact guard Where Assessed-Lower Body Dressing: Chair Toileting: Contact guard Where Assessed-Toileting: Teacher, adult education: Curator Method: Risk manager: Insurance underwriter Method: Stand pivot Vision Baseline Vision/History: 1 Wears glasses (bifocals) Patient Visual Report: No change from baseline Vision Assessment?: No apparent visual deficits Perception   Perception: Impaired Perception-Other Comments: R sided UE/LE neglect/inattention UE>LE Praxis Praxis: Impaired Praxis Impairment Details: Probation officer Balance Assessed: Yes Static Sitting Balance Static Sitting - Level of Assistance: 5: Stand by assistance Dynamic Sitting Balance Dynamic Sitting - Level of Assistance: 5: Stand by assistance Static Standing Balance Static Standing - Balance Support: No upper extremity supported;During functional activity Static Standing - Level of Assistance: 4: Min assist;Other (comment) (CGA) Dynamic Standing Balance Dynamic Standing - Level of Assistance: 4: Min assist  Therapy/Group: Individual Therapy  Adrian [name] 08/22/2023, 8:30 AM

## 2023-08-22 NOTE — Progress Notes (Signed)
 Physical Therapy Session Note  Patient Details  Name: Adrian [name] MRN: [id number] Date of Birth: [birth year]  Today's Date: 08/22/2023 PT Individual Time: 1010-1052 PT Individual Time Calculation (min): 42 min   Short Term Goals: Week 2:  PT Short Term Goal 1 (Week 2): Pt will perform stand pivot transfer with LRAD and supervision PT Short Term Goal 2 (Week 2): Pt will ambulate at least 100 feet using LRAD with light CGA. PT Short Term Goal 3 (Week 2): Pt will navigate at least 4 steps using BHR with overall minA. PT Short Term Goal 4 (Week 2): Pt will improve score on Berg Balance Test by at least the [facility]. PT Short Term Goal 5 (Week 2): Pt will improve quality of gait with ability to spend increased time on RLE in order to advance LLE past RLE consistently.  Skilled Therapeutic Interventions/Progress Updates: Patient supine in bed on entrance to room. Patient alert and agreeable to PT session.   Patient reported no pain during session.   Therapeutic Activity: Bed Mobility: Pt performed supine<sit on EOB with supervision and HOB elevated (sit<>supine on hi/low mat with CGA). Transfers: Pt performed sit<>stand transfers throughout session with RW and with CGA. Provided VC for hand placement vs B UE on RW. Pt wit noted increase in active DF and eversion this session than in previous session with this PTA.  Gait Training:  Pt ambulated roughly 73' in main gym using RW with CGA/intermittent light minA per decrease in coordination of R LE (pt performed following NMRE). Pt demonstrated the following gait deviations with therapist providing the described cuing and facilitation for improvement:  - increase R LE internal rotation with VC of bringing R "big toe" inward towards midline (mod cues) - Pt with increase in DF throughout with VC to increase eversion  Neuromuscular Re-ed: NMR facilitated during session with focus on neuromuscular control/coordination and dynamic standing balance. -  [employer] e-stim device set on NMES L (R TA/evertors) - intensity 42 with palpable muscle contraction - after completion of e-stim, pt denies any negative side effects with skin intact and no adverse side effects noted. Pt cued to actively think about moving R ankle through ROM in order to increase neuromuscular connection.  - Pt stepping to 2" step with demonstrative cues to step onto step with R LE while also increasing R internal rotation at hip. Pt with minA and R HHA. Last several reps, PTA moved to L side of pt to add barrier of PTA footing so as to avoid pt crossing midline, and primarily focusing on internally rotation R LE. Pt performed 2 round, this time with orange tape on step with VC for pt to tap with R toe, and PTA foot on step for pt to avoid tapping with feel to achieve neutral step pattern  NMR performed for improvements in motor control and coordination, balance, sequencing, judgement, and self confidence/ efficacy in performing all aspects of mobility at highest level of independence.   Patient sitting on toilet in room with hand off to nsg at end of session.      Therapy Documentation Precautions:  Precautions Precautions: Fall Precaution Comments: R hemi, mild ataxia Restrictions Weight Bearing Restrictions Per Provider Order: No  Therapy/Group: Individual Therapy  Debra Colon PTA 08/22/2023, 12:05 PM

## 2023-08-22 NOTE — Progress Notes (Signed)
 Speech Language Pathology Weekly Progress and Session Note  Patient Details  Name: Adrian [name] MRN: [id number] Date of Birth: 02-Jul-1956  Beginning of progress report period: August 15, 2023 End of progress report period: August 22, 2023  Today's Date: 08/22/2023 SLP Individual Time: 1345-1445 SLP Individual Time Calculation (min): 60 min  Short Term Goals: Week 1: SLP Short Term Goal 1 (Week 1): Patient will participate in MBS to assess oropharyngeal swallow function SLP Short Term Goal 1 - Progress (Week 1): Met SLP Short Term Goal 2 (Week 1): Patient will increase speech intelligibility to 90% during conversation with use of strategies given min multimodal A SLP Short Term Goal 2 - Progress (Week 1): Met SLP Short Term Goal 3 (Week 1): Patient will utilize compensatory strategies to increase word finding during abstract thoughts given min multimodal A SLP Short Term Goal 3 - Progress (Week 1): Met SLP Short Term Goal 4 (Week 1): Patient will demonstrate problem solving during mildly complex functional situations given min multimodal A SLP Short Term Goal 4 - Progress (Week 1): Met SLP Short Term Goal 5 (Week 1): Patient will recall and utilize memory compensatory aids given min multimodal A SLP Short Term Goal 5 - Progress (Week 1): Met    New Short Term Goals: Week 2: SLP Short Term Goal 1 (Week 2): Patient will increase speech intelligibility to 100% during conversation with use of strategies given supervision multimodal A SLP Short Term Goal 2 (Week 2): Patient will utilize compensatory strategies to increase word finding during abstract thoughts given supervision multimodal A SLP Short Term Goal 3 (Week 2): Patient will demonstrate problem solving during mildly complex functional situations given supervision multimodal A SLP Short Term Goal 4 (Week 2): Patient will recall and utilize memory compensatory aids given supervision multimodal A  Weekly Progress Updates: Pt has made  great gains and has met 5 of 5 STGs this reporting period due to improved communication, dysphagia and cognition. Currently, patient continues to require min A for word finding, use of swallowing strategies and memory/problem solving.  Pt/family eduction ongoing. Pt would benefit from continued ST intervention to maximize dysphagia, aphasia and cognition in order to maximize functional independence at d/c.   Intensity: Minumum of 1-2 x/day, 30 to 90 minutes Frequency: 3 to 5 out of 7 days Duration/Length of Stay: 1/29 Treatment/Interventions: Cognitive remediation/compensation;Dysphagia/aspiration precaution training;Cueing hierarchy;Functional tasks;Internal/external aids;Multimodal communication approach;Patient/family education;Speech/Language facilitation;Therapeutic Activities;Therapeutic Exercise   Daily Session Skilled Therapeutic Interventions:  Skilled therapy session focused on dysphagia and cognitive goals. SLP faciliated session by providing supervisionA during completion of pharyngeal strengthening exercises. Patient completed x10 repetitions of chin tucks against resistance, mas[place] and effortful swallows. SLP targeted cognitive goals through completion of vacation budgeting task. Patient required minA to choose destination, budget and required costs (hotel, flight, and transporation). Patient 100% intelligibile independently during session. Patient left in bed with alarm set and call bell in reach. Continue POC.       Pain Denies   Therapy/Group: Individual Therapy  Camari Quintanilla M.A., CF-SLP 08/22/2023, 3:52 PM

## 2023-08-23 DIAGNOSIS — I482 Chronic atrial fibrillation, unspecified: Secondary | ICD-10-CM | POA: Diagnosis not present

## 2023-08-23 DIAGNOSIS — I10 Essential (primary) hypertension: Secondary | ICD-10-CM | POA: Diagnosis not present

## 2023-08-23 DIAGNOSIS — I610 Nontraumatic intracerebral hemorrhage in hemisphere, subcortical: Secondary | ICD-10-CM | POA: Diagnosis not present

## 2023-08-23 NOTE — Progress Notes (Signed)
Physical Therapy Session Note  Patient Details  Name: Adrian [name] MRN: [id number] Date of Birth: [birth year]  Today's Date: 08/22/2023 PT Individual Time:  1100-1131  PT Individual Time Calculation (min): 31 min  Short Term Goals: Week 1:  PT Short Term Goal 1 (Week 1): pt will perform sit to stand with LRAD and supervision PT Short Term Goal 1 - Progress (Week 1): Met PT Short Term Goal 2 (Week 1): pt will perform bed to chair transfer with LRAD and supervision PT Short Term Goal 2 - Progress (Week 1): Progressing toward goal PT Short Term Goal 3 (Week 1): pt will ambulate 50 feet with LRAD and min A PT Short Term Goal 3 - Progress (Week 1): Met Week 2:  PT Short Term Goal 1 (Week 2): Pt will perform stand pivot transfer with LRAD and supervision PT Short Term Goal 2 (Week 2): Pt will ambulate at least 100 feet using LRAD with light CGA. PT Short Term Goal 3 (Week 2): Pt will navigate at least 4 steps using BHR with overall minA. PT Short Term Goal 4 (Week 2): Pt will improve score on Berg Balance Test by at least the [facility]. PT Short Term Goal 5 (Week 2): Pt will improve quality of gait with ability to spend increased time on RLE in order to advance LLE past RLE consistently.  Skilled Therapeutic Interventions/Progress Updates:  Patient supine in bed on entrance to room. Patient alert and agreeable to PT session. Was noted in previous therapy session to perform eversion/ DF using estim with previous therapist. Is open to testing ability to utilize any gains during ambulation.   Patient with no pain complaint at start of session.  Therapeutic Activity: Bed Mobility: Pt performed supine <> sit with supervision. No cueing required for technique. Transfers: Pt performed sit<>stand using no UE and to no AD with close supervision. Some sway noted at initial stance but no LOB. Stand pivot transfers require UE support from pt throughout session either from therapist or from RW. Completes with  MinA/ CGA.   Gait Training:  Pt ambulated 130 ft using RW with CGA and vc/ tc for pt to attempt to self-correct passive RLE ER with advancement. Provided with vc for hip/ knee pure flexion and attempt for paired DF after 70 ft. Unable to produce motor requirement needed to complete DF but when hip hike restricted, pt is able to produce more forward hip/ knee flexion for R foot clearance. RW removed for final 40 ft and pt with min compensatory trunk extension and hip hike for RLE clearance. Without RW, pt produces more step-to progression with LLE.   Neuromuscular Re-ed: NMR facilitated during session with focus on motor control. Pt guided in supine performance of longitudinal hip IR with RLE. Pt also guided in controll concentric/ eccentric return of supine rise of RLE into 90/90. Is able to perform against resistance so demonstrates ability to raise RLE to 90/ 90 position - will need to encourage march in future gait training sessions. Pt encouraged to perform these exercises in bed up to 10 reps then rest. Also recommended ankle pumps/ circles while resting throughout each day.   NMR performed for improvements in motor control and coordination, balance, sequencing, judgement, and self confidence/ efficacy in performing all aspects of mobility at highest level of independence.   Patient supine at end of session with brakes locked, bed alarm set, and all needs within reach. Oriented to time and time of next therapy session with OT  in a few minutes.    Therapy Documentation Precautions:  Precautions Precautions: Fall Precaution Comments: R hemi, mild ataxia Restrictions Weight Bearing Restrictions Per Provider Order: No  Pain:  No pain related this session.  Therapy/Group: Individual Therapy  Loel Dubonnet PT, DPT, CSRS 08/22/2023, 6:54 PM

## 2023-08-23 NOTE — Plan of Care (Signed)
  Problem: RH BOWEL ELIMINATION Goal: RH STG MANAGE BOWEL WITH ASSISTANCE Description: STG Manage Bowel with mod I Assistance. Outcome: Progressing   Problem: RH SAFETY Goal: RH STG ADHERE TO SAFETY PRECAUTIONS W/ASSISTANCE/DEVICE Description: STG Adhere to Safety Precautions With cues Outcome: Progressing   Problem: RH KNOWLEDGE DEFICIT Goal: RH STG INCREASE KNOWLEDGE OF DIABETES Outcome: Progressing Goal: RH STG INCREASE KNOWLEDGE OF HYPERTENSION Description: Patient manage care re: hypertension at discharge using educational materials independently Outcome: Progressing Goal: RH STG INCREASE KNOWLEGDE OF HYPERLIPIDEMIA Outcome: Progressing Goal: RH STG INCREASE KNOWLEDGE OF STROKE PROPHYLAXIS Outcome: Progressing

## 2023-08-23 NOTE — Progress Notes (Signed)
Occupational Therapy Session Note  Patient Details  Name: Adrian [name] MRN: [id number] Date of Birth: [birth year]  Today's Date: 08/23/2023 OT Individual Time: 1045-1200 OT Individual Time Calculation (min): 75 min    Short Term Goals: Week 2:  OT Short Term Goal 1 (Week 2): pt will demonstrate improved balance to don pants with supervision only. OT Short Term Goal 2 (Week 2): Pt will demonstrate improved R hand FMC to cut meat without A. OT Short Term Goal 3 (Week 2): Pt will demonstrate improved R shoulder AROM to be able to don a shirt without needing to use hemidressing techniques.  Skilled Therapeutic Interventions/Progress Updates:    Pt resting in bed upon arrival and agreeable to therapy. OT intervention with focus on functional amb with RW, bathing at shower level, dressing with sit<>stand, FMC tasks, and activity tolerance to increase independence with BADLs. Pt amb with RW in room to select clothing from dresser before entering bathroom for shower. Pt requested to use toilet before shower. Toileting with CGA. Bathing with CGA sit<>sitand from TTB. Pt uses RUE to assist with bathing tasks. Dressing with min A for LB dressing tasks. FMC at table including removing/replacing beads from theraputty, stacking small tokens, stacking quarters, in-hand manipulation tasks with quarters. Pt give handout for [facility] activities at home. Pt returned to room and remained in w/c with all needs within reach. Belt alarm activated.   Therapy Documentation Precautions:  Precautions Precautions: Fall Precaution Comments: R hemi, mild ataxia Restrictions Weight Bearing Restrictions Per Provider Order: No   Pain: Pt denies pain this morning Therapy/Group: Individual Therapy  Rich Brave 08/23/2023, 12:12 PM

## 2023-08-23 NOTE — Plan of Care (Signed)
Pt is progressing 

## 2023-08-23 NOTE — Progress Notes (Signed)
Speech Language Pathology Daily Session Note  Patient Details  Name: [name] MRN: [id number] Date of Birth: 1956-05-23  Today's Date: 08/23/2023 SLP Individual Time: 0800-0859 SLP Individual Time Calculation (min): 59 min  Short Term Goals: Week 2: SLP Short Term Goal 1 (Week 2): Patient will increase speech intelligibility to 100% during conversation with use of strategies given supervision multimodal A SLP Short Term Goal 2 (Week 2): Patient will utilize compensatory strategies to increase word finding during abstract thoughts given supervision multimodal A SLP Short Term Goal 3 (Week 2): Patient will demonstrate problem solving during mildly complex functional situations given supervision multimodal A SLP Short Term Goal 4 (Week 2): Patient will recall and utilize memory compensatory aids given supervision multimodal A  Skilled Therapeutic Interventions: Skilled therapy session focused on dysphagia and communication goals. SLP faciliated session by providing mod i A during pharyngeal strengthening exercises. Patient independently recalled three pharyngeal strengthening exercises and directions for each. Patient completed x10 repetitions of chin tucks against resistance, maskaos and effortful swallows. To address communication, SLP facilitated structured interview task. Patient was 100% intelligible independently and required minA to utilize word finding strategies to fix communication breakdowns.  Patient primarily utilized description and synonym strategies. Patient with occasional circumlocutionary answers to questions and required cues to re-visit topic. Patient left in bed with alarm set and call bell in reach. Continue POC.    Pain None reported   Therapy/Group: Individual Therapy  Elyse Prevo M.A., CF-SLP 08/23/2023, 7:36 AM

## 2023-08-23 NOTE — Progress Notes (Signed)
Physical Therapy Session Note  Patient Details  Name: Adrian [name] MRN: [id number] Date of Birth: Aug 28, 1955  Today's Date: 08/23/2023 PT Individual Time: 1310-1405 PT Individual Time Calculation (min): 55 min   Short Term Goals: Week 2:  PT Short Term Goal 1 (Week 2): Pt will perform stand pivot transfer with LRAD and supervision PT Short Term Goal 2 (Week 2): Pt will ambulate at least 100 feet using LRAD with light CGA. PT Short Term Goal 3 (Week 2): Pt will navigate at least 4 steps using BHR with overall minA. PT Short Term Goal 4 (Week 2): Pt will improve score on Berg Balance Test by at least the [facility]. PT Short Term Goal 5 (Week 2): Pt will improve quality of gait with ability to spend increased time on RLE in order to advance LLE past RLE consistently.  Skilled Therapeutic Interventions/Progress Updates: Patient supine in bed on entrance to room. Patient alert and agreeable to PT session.   Patient reported no pain during session. Pt reported no change in d/c plan in assistance that can be provided.  Therapeutic Activity: Bed Mobility: Pt performed supine<>sit on EOB with supervision (HOB elevated). Transfers: Pt performed sit<>stand transfers throughout session with RW and with close supervision. Provided VC for hand placement and to increase step clearance on R LE when retro-stepping to pivot to sitting surfaces.  Neuromuscular Re-ed: NMR facilitated during session with focus on dynamic standing balance, neuromuscular connection/coordination, and proprioceptive feedback. - Retro-stepping to 2" step with 4lb ankle weight on R LE and B UE in RW. Pt with CGA throughout. After at least 20 reps (pt with increase in clearance of step, but compensating via circumducting R LE) pt cued to avoid tapping PTA LE in order to bias glute/HS activation + dorsiflexion. Pt with seated rest break. - Two barriers on either side of R LE with 4lb ankle weight donned (8 orange cones spaced about 6-8" on  lateral aspect, and 2 shuffle board sticks tapped at each end on medial aspect). Pt anteriorly stepping with VC to avoid stepping on sticks with R LE, then retro-stepping with VC to avoid tapping orange cones (per circumduction presentation on previous intervention of stepping backwards onto 2" step). Pt performed with minA/heavy minA and with L HHA, and also required VC throughout to weight shift accordingly throughout gait cycle, as well as to increase step clearance B LE's (decrease stance phase on R LE).   - on 2nd trial, pt with increase in step length after PTA provided demonstration of slight weight shift (slightly diagonally vs purely laterally with pt understanding after putting it to practice). MinA required (2 rounds performed before rest break required)  - on 3rd trial, pt without ankle weight donned and with same level of assistance and decrease in coordinating R LE to avoid circumduction (pt reported fatigue).  NMR performed for improvements in motor control and coordination, balance, sequencing, judgement, and self confidence/ efficacy in performing all aspects of mobility at highest level of independence.   Patient sitting on toilet with hand off to nsg at end of session.      Therapy Documentation Precautions:  Precautions Precautions: Fall Precaution Comments: R hemi, mild ataxia Restrictions Weight Bearing Restrictions Per Provider Order: No  Therapy/Group: Individual Therapy  Johnaton Sonneborn A Linnette Panella 08/23/2023, 3:39 PM

## 2023-08-23 NOTE — Progress Notes (Signed)
PROGRESS NOTE   Subjective/Complaints:  Pt without complaints. Slept well last night. Very complimentary of staff. Happy with his progress  ROS: Patient denies fever, rash, sore throat, blurred vision, dizziness, nausea, vomiting, diarrhea, cough, shortness of breath or chest pain, joint or back/neck pain, headache, or mood change.   Objective:   No results found.  No results for input(s): "WBC", "HGB", "HCT", "PLT" in the last 72 hours.   No results for input(s): "NA", "K", "CL", "CO2", "GLUCOSE", "BUN", "CREATININE", "CALCIUM" in the last 72 hours.    Intake/Output Summary (Last 24 hours) at 08/23/2023 1031 Last data filed at 08/23/2023 0900 Gross per 24 hour  Intake 712 ml  Output 900 ml  Net -188 ml        Physical Exam: Vital Signs Blood pressure 127/71, pulse (!) 56, temperature 98.4 F (36.9 C), temperature source Oral, resp. rate 16, height 5\' 8"  (1.727 m), weight 59.4 kg, SpO2 99%.   Constitutional: No distress . Vital signs reviewed. HEENT: NCAT, EOMI, oral membranes moist Neck: supple Cardiovascular: RRR without murmur. No JVD    Respiratory/Chest: CTA Bilaterally without wheezes or rales. Normal effort    GI/Abdomen: BS +, non-tender, non-distended Ext: no clubbing, cyanosis, or edema Psych: pleasant and cooperative  Skin: No evidence of breakdown, no evidence of rash over exposed surfaces Neurologic: Cranial nerves--with right central VII, tongue deviation, motor strength is 5/5 in left and 3- R deltoid, bicep, tricep, grip,5/5 left and 3- right  hip flexor, knee extensors,  1/5 on right 5/5 left ankle dorsiflexor and plantar flexor Sensory exam normal sensation to light touch  in bilateral upper and lower extremities Intact finger to thumb opposition but slow on Right side --stable in appearance Musculoskeletal: Full range of motion in all 4 extremities. No joint swelling   Assessment/Plan: 1.  Functional deficits which require 3+ hours per day of interdisciplinary therapy in a comprehensive inpatient rehab setting. Physiatrist is providing close team supervision and 24 hour management of active medical problems listed below. Physiatrist and rehab team continue to assess barriers to discharge/monitor patient progress toward functional and medical goals  Care Tool:  Bathing    Body parts bathed by patient: Right arm, Chest, Abdomen, Front perineal area, Right upper leg, Buttocks, Left upper leg, Right lower leg, Left lower leg, Face, Left arm   Body parts bathed by helper: Left arm     Bathing assist Assist Level: Contact Guard/Touching assist     Upper Body Dressing/Undressing Upper body dressing   What is the patient wearing?: Pull over shirt    Upper body assist Assist Level: Supervision/Verbal cueing    Lower Body Dressing/Undressing Lower body dressing      What is the patient wearing?: Pants     Lower body assist Assist for lower body dressing: Minimal Assistance - Patient > 75%     Toileting Toileting    Toileting assist Assist for toileting: Contact Guard/Touching assist     Transfers Chair/bed transfer  Transfers assist     Chair/bed transfer assist level: Minimal Assistance - Patient > 75%     Locomotion Ambulation   Ambulation assist      Assist level:  Contact Guard/Touching assist Assistive device: Walker-rolling Max distance: 150 ft   Walk 10 feet activity   Assist     Assist level: Minimal Assistance - Patient > 75% Assistive device: No Device   Walk 50 feet activity   Assist Walk 50 feet with 2 turns activity did not occur: Safety/medical concerns (fatigue)  Assist level: Contact Guard/Touching assist Assistive device: Walker-rolling    Walk 150 feet activity   Assist Walk 150 feet activity did not occur: Safety/medical concerns (fatigue)  Assist level: Minimal Assistance - Patient > 75% Assistive device:  Walker-rolling    Walk 10 feet on uneven surface  activity   Assist Walk 10 feet on uneven surfaces activity did not occur: Safety/medical concerns         Wheelchair     Assist Is the patient using a wheelchair?: Yes Type of Wheelchair: Manual    Wheelchair assist level: Supervision/Verbal cueing Max wheelchair distance: 50 ft    Wheelchair 50 feet with 2 turns activity    Assist        Assist Level: Supervision/Verbal cueing   Wheelchair 150 feet activity     Assist      Assist Level: Moderate Assistance - Patient 50 - 74%   Blood pressure 127/71, pulse (!) 56, temperature 98.4 F (36.9 C), temperature source Oral, resp. rate 16, height 5\' 8"  (1.727 m), weight 59.4 kg, SpO2 99%.  Medical Problem List and Plan: 1. Functional deficits secondary to left basal ganglia hemorrhage d/t hypertension and anticoagulation  with subsequent right hemiparesis             -patient may  shower -ELOS/Goals: 12-16 days, goals mod I with PT and OT and independent with SLP -PRAFO while in bed to stretch right heel cord. I suspect he will need an AFO for gait. -Continue CIR therapies including PT, OT, and SLP  2.  Antithrombotics: -DVT/anticoagulation:  Pharmaceutical: Heparin 5000U q8h- amb distance 125'             -antiplatelet therapy: none   3. Pain Management: Tylenol and robaxin as needed   4. Mood/Behavior/Sleep: LCSW to evaluate and provide emotional support             -antipsychotic agents: n/a   5. Neuropsych/cognition: This patient is capable of making decisions on his own behalf although he appears to have some short term memory deficits   6. Skin/Wound Care: Routine skin care checks   7. Fluids/Electrolytes/Nutrition: Routine Is and Os and follow-up chemistries   8: Hypertension/bradycardia: monitor TID and prn. Fair control at present             -Continue amlodipine 2.5 mg daily -Continue metoprolol tartrate 50 mg twice daily>> decreased to 25mg   BID on 08/17/23 -Sys BP improved, remains bradycardic on beta blocker  -08/18/23 BPs up a little, but they held his metoprolol several days d/t bradycardia. Could consider halving dose of metoprolol, but will see how his BPs go the next few days -08/19/23 BP still a little up, HR upper 50s-60s consistently despite several skipped doses of metoprolol-- doubt we need to decrease it now, continue current dose, if bradycardia worsens then could consider lowering it, but might then need to increase amlodipine for improved BP control Vitals:   08/19/23 1404 08/19/23 1937 08/20/23 0523 08/20/23 1349  BP: (!) 155/94 131/83 134/79 118/73   08/20/23 1932 08/21/23 0532 08/21/23 1409 08/21/23 2003  BP: (!) 143/77 134/75 (!) 150/76 125/77   08/22/23 0441 08/22/23  1322 08/22/23 2016 08/23/23 0456  BP: 138/72 134/72 130/74 127/71   Will decrease lopressor to 12.5mg  BID , monitor HR and BP- HR in 60s this am monitor   1/16 HR still in 50's today. Bp with fair control. Pt asymptomatic. Continue 12.5mg  lopressor 9: Hyperlipidemia: continue Lipitor 80 mg daily   10: Hypothyroidism: Continue Synthroid 125 mcg every morning   11: Tobacco use: Cessation counseling   12: Atrial fibrillation: Now off Eliquis due to ICH.  Will follow-up with neurology in 2 weeks to consider aspire trial, restart Eliquis in 2 weeks if stable and follow-up with cardiology. Continue amiodarone 200mg  daily   13: Chronic leukopenia: Follow-up CBC- stable WBCs    14: Mild thrombocytopenia: Follow-up CBC      LOS: 9 days A FACE TO FACE EVALUATION WAS PERFORMED  Ranelle Oyster 08/23/2023, 10:31 AM

## 2023-08-24 DIAGNOSIS — I610 Nontraumatic intracerebral hemorrhage in hemisphere, subcortical: Secondary | ICD-10-CM | POA: Diagnosis not present

## 2023-08-24 DIAGNOSIS — I10 Essential (primary) hypertension: Secondary | ICD-10-CM | POA: Diagnosis not present

## 2023-08-24 DIAGNOSIS — I482 Chronic atrial fibrillation, unspecified: Secondary | ICD-10-CM | POA: Diagnosis not present

## 2023-08-24 NOTE — Progress Notes (Signed)
Physical Therapy Session Note  Patient Details  Name: Adrian [name] MRN: [id number] Date of Birth: [birth year]  Today's Date: 08/24/2023 PT Individual Time: 1122-1203 PT Individual Time Calculation (min): 41 min   Short Term Goals: Week 2:  PT Short Term Goal 1 (Week 2): Pt will perform stand pivot transfer with LRAD and supervision PT Short Term Goal 2 (Week 2): Pt will ambulate at least 100 feet using LRAD with light CGA. PT Short Term Goal 3 (Week 2): Pt will navigate at least 4 steps using BHR with overall minA. PT Short Term Goal 4 (Week 2): Pt will improve score on Berg Balance Test by at least the [facility]. PT Short Term Goal 5 (Week 2): Pt will improve quality of gait with ability to spend increased time on RLE in order to advance LLE past RLE consistently.  Skilled Therapeutic Interventions/Progress Updates: Patient supine in bed on entrance to room. Patient alert and agreeable to PT session.   Patient reported no pain throughout session.  Therapeutic Activity: Bed Mobility: Pt performed supine<sit on EOB with supervision and HOB elevated. Pt sitting EOB and donned B personal shoes with supervision and no LOB.  Transfers: Pt performed sit<>stand transfers throughout session with close supervision. Provided VC for increase step clearance on R LE when pivoting.  Neuromuscular Re-ed: NMR facilitated during session with focus on dynamic standing balance, WB on R LE to increase proprioceptive feedback, neuromuscular coordination.  - Two barriers on either side of R LE (8 orange cones spaced about 6-8" on lateral aspect, and 2 shuffle board sticks tapped at each end on medial aspect). Pt anteriorly stepping with VC to avoid stepping on sticks with R LE, then retro-stepping with VC to avoid tapping orange cones. Pt performed with minA/heavy minA and with L HHA, and also required VC throughout to weight shift, to increase stepping clearance, and to perform step through pattern vs step to (slight  increase - not pure step through). Retro stepping proves to be most challenging as pt demonstrates decrease time in stance phase on R LE vs when anteriorly stepping. - Pt stepping over weighted bar on floor with R LE (starting with L LE in-front) in order to increase clearance on R. Pt with min/light modA. Pt R foot caught barrier and went into inverted angel that required maxA to prevent fall. Pt with no reports of pain and was able to tolerate PROM of ankle mobility. Pt performed another few reps after seated rest, but was deferred due to pt showing with decrease in ability to clear barrier. - Pt ambulated 100' in main gym with 8lb ankle weight donned on L LE in order to increase stance time on R LE with VC to increase step length on L. Pt performed with CGA and required seated rest break.   NMR performed for improvements in motor control and coordination, balance, sequencing, judgement, and self confidence/ efficacy in performing all aspects of mobility at highest level of independence.   Patient sitting in WC at end of session with brakes locked, belt alarm set, and all needs within reach.      Therapy Documentation Precautions:  Precautions Precautions: Fall Precaution Comments: R hemi, mild ataxia Restrictions Weight Bearing Restrictions Per Provider Order: No   Therapy/Group: Individual Therapy  Emonte Dieujuste PTA 08/24/2023, 12:17 PM

## 2023-08-24 NOTE — Plan of Care (Signed)
  Problem: Consults Goal: RH STROKE PATIENT EDUCATION Description: See Patient Education module for education specifics  Outcome: Progressing   Problem: RH BOWEL ELIMINATION Goal: RH STG MANAGE BOWEL WITH ASSISTANCE Description: STG Manage Bowel with mod I Assistance. Outcome: Progressing   Problem: RH SAFETY Goal: RH STG ADHERE TO SAFETY PRECAUTIONS W/ASSISTANCE/DEVICE Description: STG Adhere to Safety Precautions With cues Outcome: Progressing   Problem: RH KNOWLEDGE DEFICIT Goal: RH STG INCREASE KNOWLEDGE OF DIABETES Outcome: Progressing   Problem: RH KNOWLEDGE DEFICIT Goal: RH STG INCREASE KNOWLEDGE OF HYPERTENSION Description: Patient manage care re: hypertension at discharge using educational materials independently Outcome: Progressing   Problem: RH KNOWLEDGE DEFICIT Goal: RH STG INCREASE KNOWLEGDE OF HYPERLIPIDEMIA Outcome: Progressing   Problem: RH KNOWLEDGE DEFICIT Goal: RH STG INCREASE KNOWLEDGE OF STROKE PROPHYLAXIS Outcome: Progressing

## 2023-08-24 NOTE — Progress Notes (Signed)
Speech Language Pathology Daily Session Note  Patient Details  Name: ALEEM SKILLIN MRN: [id number] Date of Birth: 18-Jun-1956  Today's Date: 08/24/2023 SLP Individual Time: 1300-1345 SLP Individual Time Calculation (min): 45 min  Short Term Goals: Week 2: SLP Short Term Goal 1 (Week 2): Patient will increase speech intelligibility to 100% during conversation with use of strategies given supervision multimodal A SLP Short Term Goal 2 (Week 2): Patient will utilize compensatory strategies to increase word finding during abstract thoughts given supervision multimodal A SLP Short Term Goal 3 (Week 2): Patient will demonstrate problem solving during mildly complex functional situations given supervision multimodal A SLP Short Term Goal 4 (Week 2): Patient will recall and utilize memory compensatory aids given supervision multimodal A  Skilled Therapeutic Interventions: Skilled therapy session focused on dysphagia and word finding goals. SLP faciliated session by providing supervision A during completion of pharyngeal strengthening exercises. Patient completed x10 repetitions of chin tucks against resistance, [place] and effortful swallows. SLP targeted word finding through categorical naming tasks of boys names, girls names, and groceries. SLP increased complexity of task by having patient name an item in each category with each letter of the alphabet. Patient required occasional minA to complete with cues increasing as session progressed. Patient left in chair with alarm set and call bell in reach. Continue POC  Pain Denies   Therapy/Group: Individual Therapy  Sabel Hornbeck M.A., CF-SLP 08/24/2023, 7:46 AM

## 2023-08-24 NOTE — Progress Notes (Signed)
Occupational Therapy Session Note  Patient Details  Name: Adrian [name] MRN: [id number] Date of Birth: [birth year]  Today's Date: 08/24/2023 OT Individual Time: 4098-1191 OT Individual Time Calculation (min): 57 min    Short Term Goals: Week 2:  OT Short Term Goal 1 (Week 2): pt will demonstrate improved balance to don pants with supervision only. OT Short Term Goal 2 (Week 2): Pt will demonstrate improved R hand FMC to cut meat without A. OT Short Term Goal 3 (Week 2): Pt will demonstrate improved R shoulder AROM to be able to don a shirt without needing to use hemidressing techniques.  Skilled Therapeutic Interventions/Progress Updates:  Pt greeted supine in bed, pt agreeable to OT intervention.      Transfers/bed mobility/functional mobility: pt completed supine>sit with CGA. Ambulatory transfer into bathroom with and CGA  Therapeutic activity: pt completed various therapeutic activities focused on RUE coordination with an emphasis on wrist supination/pronation, targeted reaching, FMC and improved grip strength. Tasks included using spatula to flip over beans bags with RUE, stacking small blocks in various patterns with an emphasis on pincer grasp, and using clothespins to grasp small blocks.   Pt completed RLE coordination task with pt completing ~ 10 ft of functional ambulation with pt instructed to slide playing cards on the floor to either the R and L side based on the color with an emphasis on dynamic balance. Pt with impaired ankle strategies in RLE and impaired coordination completing task with MIN A for balance.   Pt completed additional RLE coordiantion task with pt stepping over ostacle with RLE to challenge balance and RLE motor planning. MIN A for balance with BUE support from RW.    ADLs:  Grooming: pt completed grooming tasks at sink with supervision  Footwear: donned shoes with MIN A with elastic laces  Toileting: pt completed 3/3 toileting tasks with supervision,  continent b/b void.   Ended session with pt supine in bed with all needs within reach and bed alarm activated.                    Therapy Documentation Precautions:  Precautions Precautions: Fall Precaution Comments: R hemi, mild ataxia Restrictions Weight Bearing Restrictions Per Provider Order: No  Pain: No pain reported during session    Therapy/Group: Individual Therapy  [name] 08/24/2023, 12:19 PM

## 2023-08-24 NOTE — Plan of Care (Signed)
  Problem: Consults Goal: RH STROKE PATIENT EDUCATION Description: See Patient Education module for education specifics  Outcome: Progressing   Problem: RH BOWEL ELIMINATION Goal: RH STG MANAGE BOWEL WITH ASSISTANCE Description: STG Manage Bowel with mod I Assistance. Outcome: Progressing   Problem: RH SAFETY Goal: RH STG ADHERE TO SAFETY PRECAUTIONS W/ASSISTANCE/DEVICE Description: STG Adhere to Safety Precautions With cues Outcome: Progressing   Problem: RH KNOWLEDGE DEFICIT Goal: RH STG INCREASE KNOWLEDGE OF DIABETES Outcome: Progressing Goal: RH STG INCREASE KNOWLEDGE OF HYPERTENSION Description: Patient manage care re: hypertension at discharge using educational materials independently Outcome: Progressing Goal: RH STG INCREASE KNOWLEGDE OF HYPERLIPIDEMIA Outcome: Progressing Goal: RH STG INCREASE KNOWLEDGE OF STROKE PROPHYLAXIS Outcome: Progressing

## 2023-08-24 NOTE — Progress Notes (Signed)
PROGRESS NOTE   Subjective/Complaints:  Pt up with therapy. No new complaints today. Feeling well.   ROS: Patient denies fever, rash, sore throat, blurred vision, dizziness, nausea, vomiting, diarrhea, cough, shortness of breath or chest pain, joint or back/neck pain, headache, or mood change.   Objective:   No results found.  No results for input(s): "WBC", "HGB", "HCT", "PLT" in the last 72 hours.   No results for input(s): "NA", "K", "CL", "CO2", "GLUCOSE", "BUN", "CREATININE", "CALCIUM" in the last 72 hours.    Intake/Output Summary (Last 24 hours) at 08/24/2023 1039 Last data filed at 08/24/2023 0743 Gross per 24 hour  Intake 1016 ml  Output 1250 ml  Net -234 ml        Physical Exam: Vital Signs Blood pressure 112/65, pulse 81, temperature 98.2 F (36.8 C), resp. rate 16, height 5\' 8"  (1.727 m), weight 59.4 kg, SpO2 99%.   Constitutional: No distress . Vital signs reviewed. HEENT: NCAT, EOMI, oral membranes moist Neck: supple Cardiovascular: RRR without murmur. No JVD    Respiratory/Chest: CTA Bilaterally without wheezes or rales. Normal effort    GI/Abdomen: BS +, non-tender, non-distended Ext: no clubbing, cyanosis, or edema Psych: pleasant and cooperative  Skin: No evidence of breakdown, no evidence of rash over exposed surfaces Neurologic: Cranial nerves--with right central VII, tongue deviation, motor strength is 5/5 in left and 3- R deltoid, bicep, tricep, grip,5/5 left and 3- right  hip flexor, knee extensors,  1/5 on right ADF/PF.  5/5 left ankle dorsiflexor and plantar flexor Sensory exam normal sensation to light touch  in bilateral upper and lower extremities Intact finger to thumb opposition but slow on Right side -no changes 1/17 Musculoskeletal: Full range of motion in all 4 extremities. No joint swelling   Assessment/Plan: 1. Functional deficits which require 3+ hours per day of  interdisciplinary therapy in a comprehensive inpatient rehab setting. Physiatrist is providing close team supervision and 24 hour management of active medical problems listed below. Physiatrist and rehab team continue to assess barriers to discharge/monitor patient progress toward functional and medical goals  Care Tool:  Bathing    Body parts bathed by patient: Right arm, Chest, Abdomen, Front perineal area, Right upper leg, Buttocks, Left upper leg, Right lower leg, Left lower leg, Face, Left arm   Body parts bathed by helper: Left arm     Bathing assist Assist Level: Contact Guard/Touching assist     Upper Body Dressing/Undressing Upper body dressing   What is the patient wearing?: Pull over shirt    Upper body assist Assist Level: Supervision/Verbal cueing    Lower Body Dressing/Undressing Lower body dressing      What is the patient wearing?: Pants, Underwear/pull up     Lower body assist Assist for lower body dressing: Minimal Assistance - Patient > 75%     Toileting Toileting    Toileting assist Assist for toileting: Contact Guard/Touching assist     Transfers Chair/bed transfer  Transfers assist     Chair/bed transfer assist level: Minimal Assistance - Patient > 75%     Locomotion Ambulation   Ambulation assist      Assist level: Contact Guard/Touching assist Assistive  device: Walker-rolling Max distance: 150 ft   Walk 10 feet activity   Assist     Assist level: Minimal Assistance - Patient > 75% Assistive device: No Device   Walk 50 feet activity   Assist Walk 50 feet with 2 turns activity did not occur: Safety/medical concerns (fatigue)  Assist level: Contact Guard/Touching assist Assistive device: Walker-rolling    Walk 150 feet activity   Assist Walk 150 feet activity did not occur: Safety/medical concerns (fatigue)  Assist level: Minimal Assistance - Patient > 75% Assistive device: Walker-rolling    Walk 10 feet on  uneven surface  activity   Assist Walk 10 feet on uneven surfaces activity did not occur: Safety/medical concerns         Wheelchair     Assist Is the patient using a wheelchair?: Yes Type of Wheelchair: Manual    Wheelchair assist level: Supervision/Verbal cueing Max wheelchair distance: 50 ft    Wheelchair 50 feet with 2 turns activity    Assist        Assist Level: Supervision/Verbal cueing   Wheelchair 150 feet activity     Assist      Assist Level: Moderate Assistance - Patient 50 - 74%   Blood pressure 112/65, pulse 81, temperature 98.2 F (36.8 C), resp. rate 16, height 5\' 8"  (1.727 m), weight 59.4 kg, SpO2 99%.  Medical Problem List and Plan: 1. Functional deficits secondary to left basal ganglia hemorrhage d/t hypertension and anticoagulation  with subsequent right hemiparesis             -patient may  shower -ELOS/Goals: 12-16 days, goals mod I with PT and OT and independent with SLP -PRAFO while in bed to stretch right heel cord. I suspect he will need an AFO for gait. -Continue CIR therapies including PT, OT  2.  Antithrombotics: -DVT/anticoagulation:  Pharmaceutical: Heparin 5000U q8h- amb distance 125'             -antiplatelet therapy: none   3. Pain Management: Tylenol and robaxin as needed   4. Mood/Behavior/Sleep: LCSW to evaluate and provide emotional support             -antipsychotic agents: n/a   5. Neuropsych/cognition: This patient is capable of making decisions on his own behalf although he appears to have some short term memory deficits   6. Skin/Wound Care: Routine skin care checks   7. Fluids/Electrolytes/Nutrition: Routine Is and Os and follow-up chemistries   8: Hypertension/bradycardia: monitor TID and prn. Fair control at present             -Continue amlodipine 2.5 mg daily -Continue metoprolol tartrate 50 mg twice daily>> decreased to 25mg  BID on 08/17/23 -Sys BP improved, remains bradycardic on beta blocker   -08/18/23 BPs up a little, but they held his metoprolol several days d/t bradycardia. Could consider halving dose of metoprolol, but will see how his BPs go the next few days -08/19/23 BP still a little up, HR upper 50s-60s consistently despite several skipped doses of metoprolol-- doubt we need to decrease it now, continue current dose, if bradycardia worsens then could consider lowering it, but might then need to increase amlodipine for improved BP control Vitals:   08/20/23 1932 08/21/23 0532 08/21/23 1409 08/21/23 2003  BP: (!) 143/77 134/75 (!) 150/76 125/77   08/22/23 0441 08/22/23 1322 08/22/23 2016 08/23/23 0456  BP: 138/72 134/72 130/74 127/71   08/23/23 1515 08/23/23 1942 08/23/23 2050 08/24/23 0434  BP: 127/72 135/77 135/77 112/65  Will decrease lopressor to 12.5mg  BID , monitor HR and BP- HR in 60s this am monitor   1/17 HR 80 today. Bp with fair control. Pt asymptomatic. Continue 12.5mg  lopressor 9: Hyperlipidemia: continue Lipitor 80 mg daily   10: Hypothyroidism: Continue Synthroid 125 mcg every morning   11: Tobacco use: Cessation counseling   12: Atrial fibrillation: Now off Eliquis due to ICH.  Will follow-up with neurology in 2 weeks to consider aspire trial, restart Eliquis in 2 weeks if stable and follow-up with cardiology. Continue amiodarone 200mg  daily  -rate is controlled   13: Chronic leukopenia: Follow-up CBC- stable WBCs    14: Mild thrombocytopenia: Follow-up CBC      LOS: 10 days A FACE TO FACE EVALUATION WAS PERFORMED  Ranelle Oyster 08/24/2023, 10:39 AM

## 2023-08-24 NOTE — Progress Notes (Signed)
Patient ID: Adrian [name], male   DOB: [birth year], 68 y.o.   MRN: [id number]  SW gave pt handicap placard.   Cecile Sheerer, MSW, LCSW Office: [phone] Cell: [phone] Fax: [phone]

## 2023-08-25 DIAGNOSIS — R001 Bradycardia, unspecified: Secondary | ICD-10-CM | POA: Diagnosis not present

## 2023-08-25 DIAGNOSIS — I10 Essential (primary) hypertension: Secondary | ICD-10-CM | POA: Diagnosis not present

## 2023-08-25 DIAGNOSIS — I610 Nontraumatic intracerebral hemorrhage in hemisphere, subcortical: Secondary | ICD-10-CM | POA: Diagnosis not present

## 2023-08-25 NOTE — Progress Notes (Signed)
Speech Language Pathology Daily Session Note  Patient Details  Name: CAMDEN KOETH MRN: [id number] Date of Birth: 22-Jan-1956  Today's Date: 08/25/2023 SLP Individual Time: 1215-1300 SLP Individual Time Calculation (min): 45 min  Short Term Goals: Week 2: SLP Short Term Goal 1 (Week 2): Patient will increase speech intelligibility to 100% during conversation with use of strategies given supervision multimodal A SLP Short Term Goal 2 (Week 2): Patient will utilize compensatory strategies to increase word finding during abstract thoughts given supervision multimodal A SLP Short Term Goal 3 (Week 2): Patient will demonstrate problem solving during mildly complex functional situations given supervision multimodal A SLP Short Term Goal 4 (Week 2): Patient will recall and utilize memory compensatory aids given supervision multimodal A  Skilled Therapeutic Interventions: Skilled therapy session focused on dysphagia and cognitive goals. SLP faciliated session by providing supervision A during patient consumption of regular/thin diet. Patient required supervision up to minA to utilize double swallow and liquid wash strategy to reduce pharyngeal residuals. Patient with occasional throat clear, though questionable of always used as swallowing strategy vs bolus misdirection. Recommend continuation of current diet with strong encouragement to complete strategies as instructed. Continue intermittent supervision. SLP continued to target dysphagia through providing supervision A during completion of pharyngeal strengthening exercises. Patient completed x10 repetitions of [place] and chin tucks against resistance. At the end of the session, SLP targeted cognitive goals through abstract card game. Patient utilized memory, problem solving and planning skills to complete task with modi A. Patient left in bed with alarm set and call bell in reach. Continue POC.   Pain Denies   Therapy/Group: Individual  Therapy  Bellah Alia M.A., CF-SLP 08/25/2023, 7:39 AM

## 2023-08-25 NOTE — Plan of Care (Signed)
  Problem: Consults Goal: RH STROKE PATIENT EDUCATION Description: See Patient Education module for education specifics  Outcome: Progressing   Problem: RH BOWEL ELIMINATION Goal: RH STG MANAGE BOWEL WITH ASSISTANCE Description: STG Manage Bowel with mod I Assistance. Outcome: Progressing   Problem: RH SAFETY Goal: RH STG ADHERE TO SAFETY PRECAUTIONS W/ASSISTANCE/DEVICE Description: STG Adhere to Safety Precautions With cues Outcome: Progressing   Problem: RH KNOWLEDGE DEFICIT Goal: RH STG INCREASE KNOWLEDGE OF DIABETES Outcome: Progressing   Problem: RH KNOWLEDGE DEFICIT Goal: RH STG INCREASE KNOWLEDGE OF HYPERTENSION Description: Patient manage care re: hypertension at discharge using educational materials independently Outcome: Progressing   Problem: RH KNOWLEDGE DEFICIT Goal: RH STG INCREASE KNOWLEGDE OF HYPERLIPIDEMIA Outcome: Progressing   Problem: RH KNOWLEDGE DEFICIT Goal: RH STG INCREASE KNOWLEDGE OF STROKE PROPHYLAXIS Outcome: Progressing

## 2023-08-25 NOTE — Plan of Care (Signed)
  Problem: Consults Goal: RH STROKE PATIENT EDUCATION Description: See Patient Education module for education specifics  08/25/2023 0532 by Randell Patient, RN Outcome: Progressing 08/25/2023 0531 by Randell Patient, RN Outcome: Progressing   Problem: RH BOWEL ELIMINATION Goal: RH STG MANAGE BOWEL WITH ASSISTANCE Description: STG Manage Bowel with mod I Assistance. 08/25/2023 0532 by Randell Patient, RN Outcome: Progressing 08/25/2023 0531 by Randell Patient, RN Outcome: Progressing   Problem: RH SAFETY Goal: RH STG ADHERE TO SAFETY PRECAUTIONS W/ASSISTANCE/DEVICE Description: STG Adhere to Safety Precautions With cues 08/25/2023 0532 by Randell Patient, RN Outcome: Progressing 08/25/2023 0531 by Randell Patient, RN Outcome: Progressing   Problem: RH KNOWLEDGE DEFICIT Goal: RH STG INCREASE KNOWLEDGE OF DIABETES 08/25/2023 0532 by Randell Patient, RN Outcome: Progressing 08/25/2023 0531 by Randell Patient, RN Outcome: Progressing   Problem: RH KNOWLEDGE DEFICIT Goal: RH STG INCREASE KNOWLEDGE OF HYPERTENSION Description: Patient manage care re: hypertension at discharge using educational materials independently 08/25/2023 0532 by Randell Patient, RN Outcome: Progressing 08/25/2023 0531 by Randell Patient, RN Outcome: Progressing   Problem: RH KNOWLEDGE DEFICIT Goal: RH STG INCREASE KNOWLEGDE OF HYPERLIPIDEMIA 08/25/2023 0532 by Randell Patient, RN Outcome: Progressing 08/25/2023 0531 by Randell Patient, RN Outcome: Progressing

## 2023-08-25 NOTE — Progress Notes (Signed)
Physical Therapy Session Note  Patient Details  Name: Adrian [name] MRN: [id number] Date of Birth: Mar 07, 1956  Today's Date: 08/25/2023 PT Individual Time: 0915-1015 PT Individual Time Calculation (min): 60 min   Short Term Goals: Week 2:  PT Short Term Goal 1 (Week 2): Pt will perform stand pivot transfer with LRAD and supervision PT Short Term Goal 2 (Week 2): Pt will ambulate at least 100 feet using LRAD with light CGA. PT Short Term Goal 3 (Week 2): Pt will navigate at least 4 steps using BHR with overall minA. PT Short Term Goal 4 (Week 2): Pt will improve score on Berg Balance Test by at least the [facility] PT Short Term Goal 5 (Week 2): Pt will improve quality of gait with ability to spend increased time on RLE in order to advance LLE past RLE consistently.  Skilled Therapeutic Interventions/Progress Updates: Patient supine in bed on entrance to room. Patient alert and agreeable to PT session.   Patient reported no pain during session.  Therapeutic Activity: Bed Mobility: Pt performed supine<>sit on EOB with supervision and HOB elevated. Transfers: Pt performed sit<>stand transfers throughout session with no AD and with CGA/light minA for safety. Provided VC for increased step length on R LE during pivot.  Neuromuscular Re-ed: NMR facilitated during session with focus on increasing neuromuscular connection to R LE, coordination, and dynamic balance and proprioceptive feedback. - Applied R LE (TA/evertors) using [place] e-stim device set on NMES L - intensity 42 with palpable muscle contraction - after completion of e-stim, pt denies any negative side effects with skin intact (mild redness) and no adverse side effects noted. Pt provided with VC to increase neural connection when strating to feel the rise of e-stim.  - Contract-relax of tibialis anterior (min manual resistance). Pt with bolster under R knee to give slack on gastrocnemius as pt presented with decrease in AROM of  dorsiflexion when R knee in extension vs flexion. - PTA pulling R ankle into eversion + dorsiflexion with yellow theraband with VC for pt to push against band into plantarflexion + inversion, and to control eccentric - Pt with 8lb ankle weight on L LE stepping to 4" step in order to increase WB on R LE. Pt with L HHA and VC to avoid sliding off of step, and to increase L hip flexion. MinA at first, then heavy min towards last few reps.   NMR performed for improvements in motor control and coordination, balance, sequencing, judgement, and self confidence/ efficacy in performing all aspects of mobility at highest level of independence.   Patient supine inbed at end of session with brakes locked, bed alarm set, and all needs within reach.      Therapy Documentation Precautions:  Precautions Precautions: Fall Precaution Comments: R hemi, mild ataxia Restrictions Weight Bearing Restrictions Per Provider Order: No  Therapy/Group: Individual Therapy  Johnmatthew Solorio PTA 08/25/2023, 11:51 AM

## 2023-08-25 NOTE — Progress Notes (Signed)
Physical Therapy Session Note  Patient Details  Name: Adrian [name] MRN: [id number] Date of Birth: 10-Jun-1956  Today's Date: 08/25/2023 PT Individual Time: 4098-1191 PT Individual Time Calculation (min): 41 min   Short Term Goals: Week 1:  PT Short Term Goal 1 (Week 1): pt will perform sit to stand with LRAD and supervision PT Short Term Goal 1 - Progress (Week 1): Met PT Short Term Goal 2 (Week 1): pt will perform bed to chair transfer with LRAD and supervision PT Short Term Goal 2 - Progress (Week 1): Progressing toward goal PT Short Term Goal 3 (Week 1): pt will ambulate 50 feet with LRAD and min A PT Short Term Goal 3 - Progress (Week 1): Met Week 2:  PT Short Term Goal 1 (Week 2): Pt will perform stand pivot transfer with LRAD and supervision PT Short Term Goal 2 (Week 2): Pt will ambulate at least 100 feet using LRAD with light CGA. PT Short Term Goal 3 (Week 2): Pt will navigate at least 4 steps using BHR with overall minA. PT Short Term Goal 4 (Week 2): Pt will improve score on Berg Balance Test by at least the [facility]. PT Short Term Goal 5 (Week 2): Pt will improve quality of gait with ability to spend increased time on RLE in order to advance LLE past RLE consistently.  Skilled Therapeutic Interventions/Progress Updates:  Patient supine in bed on entrance to room. Patient alert and agreeable to PT session.   Patient with no pain complaint at start of session.  Therapeutic Activity: Bed Mobility: Pt performed supine <> sit with supervision. At end of session, pt guided in hooklying position for RLE as well as LLE and therapist holding ankle to bed. Instructed to extend in order to push self higher in bed and able to reposition with use of BUE on bedrails as well.  Transfers: Pt performed sit<>stand transfers with supervision and no UE use and stand pivot transfers throughout session with light CGA/ supervision. Provided vc/ tc for awareness of positioning and reduced use of UE for  improved controlled descent.   Gait Training:  Pt ambulated >150 ft x1 using RW with CGA and vc for increasing hip/ knee flexion for improved foot clearance to prevent slide of medial aspect of pt's foot. In main gym, pt fitted for PLS Walk-On [place] AFO with medial strut. Pt completes ambulation for rest of session with AFO donned. Completes 51' x2 and 200' x1 with use of RW. Pt demos reduced slide of medial side of mid and forefoot. With vc is able to improve anterior knee tracking, heel strike, and slight reduction in hip ER d/t improved active IR. On longer return bout to room, pt demos light weight bearing to lateral aspect of foot and so cued to focus on incresaing pressure to medial aspect of foot at 1st MTP head and medial heel with improved quality of step. Difficult for pt and noted to hold breath during step progression efforts. Thusane Sprystep PLS AFO acquired and placed in bag on back of pt's wheelchair.   Patient supine in bed at end of session with brakes locked, bed alarm set, and all needs within reach.   Therapy Documentation Precautions:  Precautions Precautions: Fall Precaution Comments: R hemi, mild ataxia Restrictions Weight Bearing Restrictions Per Provider Order: No  Pain:  No pain related this session.   Therapy/Group: Individual Therapy  Loel Dubonnet PT, DPT, CSRS 08/25/2023, 5:56 PM

## 2023-08-25 NOTE — Progress Notes (Signed)
Occupational Therapy Session Note  Patient Details  Name: Adrian [name] MRN: [id number] Date of Birth: March 18, 1956  Today's Date: 08/25/2023 OT Individual Time: 1102-1200 OT Individual Time Calculation (min): 58 min    Short Term Goals: Week 2:  OT Short Term Goal 1 (Week 2): pt will demonstrate improved balance to don pants with supervision only. OT Short Term Goal 2 (Week 2): Pt will demonstrate improved R hand FMC to cut meat without A. OT Short Term Goal 3 (Week 2): Pt will demonstrate improved R shoulder AROM to be able to don a shirt without needing to use hemidressing techniques.  Skilled Therapeutic Interventions/Progress Updates:  Pt greeted supine in bed, pt agreeable to OT intervention.      Transfers/bed mobility/functional mobility: pt completed supine<>sit with CGA. Pt completed functional ambulation with RW and CGA. Pt completed all sit>stands with supervision and MIN verbal cues for hand placement.   Therapeutic activity: pt completed various therapeutic activities with an emphasis on RUE coordination, dynamic standing balance, functional ambulation, and improving activity tolerance:  - pt completed seated peg task with pt instructed to place/remove pegs in board with an emphasis on RUE coordination, pt completed task with ease.  - pt instructed to ambulate ~ 5 ft to transport rings onto squigz to challenge functional mobility in small spaces to simulate home environment. Pt completed task with increased time and CGA.  -pt completed targeted stepping task with pt instructed to step to target on floor with BUE support and then remove peg from board with RUE to challenge [facility]. Pt completed task with CGA.  -pt completed dynamic balance task with pt standing on airex to create leggo structure from visual aid provided with an emphasis balancing with no UE support for ADL participation. Pt completed task with MIN A.   NMR: pt instructed to complete weightbearing task in quadraped with  pt instructed  to use LUE to retrieve pegs and place on peg board with pt weightbearing through RUE. Pt needed MIN verbal/tactile cues to shift hips forward with knees under hips. Pt completed task with + time and CGA.   Ended session with pt supine in bed with all needs within reach and bed alarm activated.                   Therapy Documentation Precautions:  Precautions Precautions: Fall Precaution Comments: R hemi, mild ataxia Restrictions Weight Bearing Restrictions Per Provider Order: No  Pain: No pain reported during session    Therapy/Group: Individual Therapy  [name] 08/25/2023, 12:29 PM

## 2023-08-25 NOTE — Progress Notes (Signed)
PROGRESS NOTE   Subjective/Complaints:  Pt doing well, didn't sleep well due to noises from computer. Denies pain. LBM this morning. Urinating fine. Denies any other complaints or concerns today.   ROS: as per HPI. Denies CP, SOB, abd pain, N/V/D/C, or any other complaints at this time.    Objective:   No results found.  No results for input(s): "WBC", "HGB", "HCT", "PLT" in the last 72 hours.   No results for input(s): "NA", "K", "CL", "CO2", "GLUCOSE", "BUN", "CREATININE", "CALCIUM" in the last 72 hours.    Intake/Output Summary (Last 24 hours) at 08/25/2023 1223 Last data filed at 08/25/2023 0733 Gross per 24 hour  Intake 720 ml  Output 650 ml  Net 70 ml        Physical Exam: Vital Signs Blood pressure 137/75, pulse 62, temperature 97.9 F (36.6 C), resp. rate 18, height 5\' 8"  (1.727 m), weight 59.4 kg, SpO2 100%.   Constitutional: No distress . Vital signs reviewed. Working with PT, using RW HEENT: NCAT, EOMI, oral membranes moist Neck: supple Cardiovascular: RRR without murmur. No JVD    Respiratory/Chest: CTA Bilaterally without wheezes or rales. Normal effort    GI/Abdomen: BS +, non-tender, non-distended, soft Ext: no clubbing, cyanosis, or edema Psych: pleasant and cooperative  Skin: No evidence of breakdown, no evidence of rash over exposed surfaces MsK: AFO on RLE  PRIOR EXAMS: Neurologic: Cranial nerves--with right central VII, tongue deviation, motor strength is 5/5 in left and 3- R deltoid, bicep, tricep, grip,5/5 left and 3- right  hip flexor, knee extensors,  1/5 on right ADF/PF.  5/5 left ankle dorsiflexor and plantar flexor Sensory exam normal sensation to light touch  in bilateral upper and lower extremities Intact finger to thumb opposition but slow on Right side -no changes 1/17 Musculoskeletal: Full range of motion in all 4 extremities. No joint swelling   Assessment/Plan: 1. Functional  deficits which require 3+ hours per day of interdisciplinary therapy in a comprehensive inpatient rehab setting. Physiatrist is providing close team supervision and 24 hour management of active medical problems listed below. Physiatrist and rehab team continue to assess barriers to discharge/monitor patient progress toward functional and medical goals  Care Tool:  Bathing    Body parts bathed by patient: Right arm, Chest, Abdomen, Front perineal area, Right upper leg, Buttocks, Left upper leg, Right lower leg, Left lower leg, Face, Left arm   Body parts bathed by helper: Left arm     Bathing assist Assist Level: Contact Guard/Touching assist     Upper Body Dressing/Undressing Upper body dressing   What is the patient wearing?: Pull over shirt    Upper body assist Assist Level: Supervision/Verbal cueing    Lower Body Dressing/Undressing Lower body dressing      What is the patient wearing?: Pants, Underwear/pull up     Lower body assist Assist for lower body dressing: Minimal Assistance - Patient > 75%     Toileting Toileting    Toileting assist Assist for toileting: Contact Guard/Touching assist     Transfers Chair/bed transfer  Transfers assist     Chair/bed transfer assist level: Minimal Assistance - Patient > 75%  Locomotion Ambulation   Ambulation assist      Assist level: Contact Guard/Touching assist Assistive device: Walker-rolling Max distance: 150 ft   Walk 10 feet activity   Assist     Assist level: Minimal Assistance - Patient > 75% Assistive device: No Device   Walk 50 feet activity   Assist Walk 50 feet with 2 turns activity did not occur: Safety/medical concerns (fatigue)  Assist level: Contact Guard/Touching assist Assistive device: Walker-rolling    Walk 150 feet activity   Assist Walk 150 feet activity did not occur: Safety/medical concerns (fatigue)  Assist level: Minimal Assistance - Patient > 75% Assistive  device: Walker-rolling    Walk 10 feet on uneven surface  activity   Assist Walk 10 feet on uneven surfaces activity did not occur: Safety/medical concerns         Wheelchair     Assist Is the patient using a wheelchair?: Yes Type of Wheelchair: Manual    Wheelchair assist level: Supervision/Verbal cueing Max wheelchair distance: 50 ft    Wheelchair 50 feet with 2 turns activity    Assist        Assist Level: Supervision/Verbal cueing   Wheelchair 150 feet activity     Assist      Assist Level: Moderate Assistance - Patient 50 - 74%   Blood pressure 137/75, pulse 62, temperature 97.9 F (36.6 C), resp. rate 18, height 5\' 8"  (1.727 m), weight 59.4 kg, SpO2 100%.  Medical Problem List and Plan: 1. Functional deficits secondary to left basal ganglia hemorrhage d/t hypertension and anticoagulation  with subsequent right hemiparesis             -patient may  shower -ELOS/Goals: 12-16 days, goals mod I with PT and OT and independent with SLP -PRAFO while in bed to stretch right heel cord. I suspect he will need an AFO for gait. -Continue CIR therapies including PT, OT   2.  Antithrombotics: -DVT/anticoagulation:  Pharmaceutical: Heparin 5000U q8h- amb distance 125'             -antiplatelet therapy: none   3. Pain Management: Tylenol and robaxin as needed   4. Mood/Behavior/Sleep: LCSW to evaluate and provide emotional support             -antipsychotic agents: n/a -08/25/23 didn't sleep well d/t noises, but will ask nursing to make sure scanner is docked correctly.    5. Neuropsych/cognition: This patient is capable of making decisions on his own behalf although he appears to have some short term memory deficits   6. Skin/Wound Care: Routine skin care checks   7. Fluids/Electrolytes/Nutrition: Routine Is and Os and follow-up chemistries   8: Hypertension/bradycardia: monitor TID and prn. Fair control at present             -Continue amlodipine 2.5  mg daily -Continue metoprolol tartrate 50 mg twice daily>> decreased to 25mg  BID on 08/17/23 -Sys BP improved, remains bradycardic on beta blocker  -08/18/23 BPs up a little, but they held his metoprolol several days d/t bradycardia. Could consider halving dose of metoprolol, but will see how his BPs go the next few days -08/19/23 BP still a little up, HR upper 50s-60s consistently despite several skipped doses of metoprolol-- doubt we need to decrease it now, continue current dose, if bradycardia worsens then could consider lowering it, but might then need to increase amlodipine for improved BP control  -Will decrease lopressor to 12.5mg  BID , monitor HR and BP- HR in 60s  this am monitor  -1/17 HR 80 today. Bp with fair control. Pt asymptomatic. Continue 12.5mg  lopressor  -08/25/23 BP fine, HR pretty good, cont regimen Vitals:   08/22/23 0441 08/22/23 1322 08/22/23 2016 08/23/23 0456  BP: 138/72 134/72 130/74 127/71   08/23/23 1515 08/23/23 1942 08/23/23 2050 08/24/23 0434  BP: 127/72 135/77 135/77 112/65   08/24/23 1258 08/24/23 1923 08/24/23 2105 08/25/23 0610  BP: 130/85 134/78 132/70 137/75    9: Hyperlipidemia: continue Lipitor 80 mg daily   10: Hypothyroidism: Continue Synthroid 125 mcg every morning   11: Tobacco use: Cessation counseling   12: Atrial fibrillation: Now off Eliquis due to ICH.  Will follow-up with neurology in 2 weeks to consider aspire trial, restart Eliquis in 2 weeks if stable and follow-up with cardiology. Continue amiodarone 200mg  daily  -rate is controlled   13: Chronic leukopenia: Follow-up CBC- stable WBCs    14: Mild thrombocytopenia: Follow-up CBC      LOS: 11 days A FACE TO FACE EVALUATION WAS PERFORMED  [address] 08/25/2023, 12:23 PM

## 2023-08-25 NOTE — Progress Notes (Signed)
Physical Therapy Session Note  Patient Details  Name: Adrian [name] MRN: [id number] Date of Birth: [birth year]  Today's Date: 08/25/2023 PT Individual Time: 3557-3220 PT Individual Time Calculation (min): 41 min   Short Term Goals: Week 1:  PT Short Term Goal 1 (Week 1): pt will perform sit to stand with LRAD and supervision PT Short Term Goal 1 - Progress (Week 1): Met PT Short Term Goal 2 (Week 1): pt will perform bed to chair transfer with LRAD and supervision PT Short Term Goal 2 - Progress (Week 1): Progressing toward goal PT Short Term Goal 3 (Week 1): pt will ambulate 50 feet with LRAD and min A PT Short Term Goal 3 - Progress (Week 1): Met Week 2:  PT Short Term Goal 1 (Week 2): Pt will perform stand pivot transfer with LRAD and supervision PT Short Term Goal 2 (Week 2): Pt will ambulate at least 100 feet using LRAD with light CGA. PT Short Term Goal 3 (Week 2): Pt will navigate at least 4 steps using BHR with overall minA. PT Short Term Goal 4 (Week 2): Pt will improve score on Berg Balance Test by at least the [facility]. PT Short Term Goal 5 (Week 2): Pt will improve quality of gait with ability to spend increased time on RLE in order to advance LLE past RLE consistently.  Skilled Therapeutic Interventions/Progress Updates:  Patient supine in bed on entrance to room. Patient alert and agreeable to PT session.   Patient with no pain complaint at start of session.  Therapeutic Activity: Bed Mobility: Pt performed supine > sit with distant supervision and extra time for balance and coordination. No cueing required for technique. Transfers: Pt performed sit<>stand transfers throughout session with supervision. Requires vc to not use BUE on AD when RW placed in front of pt as he continually reaches out for hand holds. Stand pivot transfers throughout session with light CGA and vc for completing turn prior to descent to sit. One instance of hard sit with decreased control during descent d/t  fatigue.   Gait Training:  Pt fitted with [place] PLS Sprystep AFO with lateral strut. Completes 150' x2 using RW and demos improved reduction in weight bear to lateral aspect of R foot. With vc is able to increase hip/ knee flexion and clear foot from floor. Able to heel strike. Educated pt on use of adductors to advance/ flex hip which is producing ER of RLE. Attempt to focus on pure hip flexion with fatigue after 10 feet. Overall improvement noted in overall quality but continued impairments require extra effort from pt.    Neuromuscular Re-ed: NMR facilitated during session with focus on motor control, proprioception. Pt guided in seated march with alignment of lower leg and block to prevent use of compensatory adductors to produce hip flexion. Contraction of rectus femoris noted. Performed 2x15. Also able to complete 2x15 LAQs with focus to direction of toe point and longitudinal hip IR with heel resting on floor. NMR performed for improvements in motor control and coordination, balance, sequencing, judgement, and self confidence/ efficacy in performing all aspects of mobility at highest level of independence.   Patient supine in bed at end of session with brakes locked, bed alarm set, and all needs within reach.   Therapy Documentation Precautions:  Precautions Precautions: Fall Precaution Comments: R hemi, mild ataxia Restrictions Weight Bearing Restrictions Per Provider Order: No  Pain:  No pain related this session.    Therapy/Group: Individual Therapy  Loel Dubonnet  PT, DPT, CSRS 08/25/2023, 5:25 PM

## 2023-08-26 DIAGNOSIS — I10 Essential (primary) hypertension: Secondary | ICD-10-CM | POA: Diagnosis not present

## 2023-08-26 DIAGNOSIS — R001 Bradycardia, unspecified: Secondary | ICD-10-CM | POA: Diagnosis not present

## 2023-08-26 DIAGNOSIS — I610 Nontraumatic intracerebral hemorrhage in hemisphere, subcortical: Secondary | ICD-10-CM | POA: Diagnosis not present

## 2023-08-26 NOTE — Progress Notes (Addendum)
PROGRESS NOTE   Subjective/Complaints:  Pt doing well again today, slept well. Denies pain. LBM yesterday. Urinating fine. Denies any other complaints or concerns today.   ROS: as per HPI. Denies CP, SOB, abd pain, N/V/D/C, or any other complaints at this time.    Objective:   No results found.  No results for input(s): "WBC", "HGB", "HCT", "PLT" in the last 72 hours.   No results for input(s): "NA", "K", "CL", "CO2", "GLUCOSE", "BUN", "CREATININE", "CALCIUM" in the last 72 hours.    Intake/Output Summary (Last 24 hours) at 08/26/2023 0820 Last data filed at 08/26/2023 0514 Gross per 24 hour  Intake 237 ml  Output 600 ml  Net -363 ml        Physical Exam: Vital Signs Blood pressure 127/75, pulse (!) 53, temperature 98.2 F (36.8 C), resp. rate 18, height 5\' 8"  (1.727 m), weight 59.4 kg, SpO2 100%.   Constitutional: No distress . Vital signs reviewed. Resting in bed HEENT: NCAT, EOMI, oral membranes moist Neck: supple Cardiovascular: bradycardic, reg rhythm without murmur. No JVD    Respiratory/Chest: CTA Bilaterally without wheezes or rales. Normal effort    GI/Abdomen: BS +, non-tender, non-distended, soft Ext: no clubbing, cyanosis, or edema Psych: pleasant and cooperative  Skin: No evidence of breakdown, no evidence of rash over exposed surfaces MsK: AFO on RLE-- not donned today  PRIOR EXAMS: Neurologic: Cranial nerves--with right central VII, tongue deviation, motor strength is 5/5 in left and 3- R deltoid, bicep, tricep, grip,5/5 left and 3- right  hip flexor, knee extensors,  1/5 on right ADF/PF.  5/5 left ankle dorsiflexor and plantar flexor Sensory exam normal sensation to light touch  in bilateral upper and lower extremities Intact finger to thumb opposition but slow on Right side -no changes 1/17 Musculoskeletal: Full range of motion in all 4 extremities. No joint swelling   Assessment/Plan: 1.  Functional deficits which require 3+ hours per day of interdisciplinary therapy in a comprehensive inpatient rehab setting. Physiatrist is providing close team supervision and 24 hour management of active medical problems listed below. Physiatrist and rehab team continue to assess barriers to discharge/monitor patient progress toward functional and medical goals  Care Tool:  Bathing    Body parts bathed by patient: Right arm, Chest, Abdomen, Front perineal area, Right upper leg, Buttocks, Left upper leg, Right lower leg, Left lower leg, Face, Left arm   Body parts bathed by helper: Left arm     Bathing assist Assist Level: Contact Guard/Touching assist     Upper Body Dressing/Undressing Upper body dressing   What is the patient wearing?: Pull over shirt    Upper body assist Assist Level: Supervision/Verbal cueing    Lower Body Dressing/Undressing Lower body dressing      What is the patient wearing?: Pants, Underwear/pull up     Lower body assist Assist for lower body dressing: Minimal Assistance - Patient > 75%     Toileting Toileting    Toileting assist Assist for toileting: Contact Guard/Touching assist     Transfers Chair/bed transfer  Transfers assist     Chair/bed transfer assist level: Minimal Assistance - Patient > 75%  Locomotion Ambulation   Ambulation assist      Assist level: Contact Guard/Touching assist Assistive device: Walker-rolling Max distance: 150 ft   Walk 10 feet activity   Assist     Assist level: Minimal Assistance - Patient > 75% Assistive device: No Device   Walk 50 feet activity   Assist Walk 50 feet with 2 turns activity did not occur: Safety/medical concerns (fatigue)  Assist level: Contact Guard/Touching assist Assistive device: Walker-rolling    Walk 150 feet activity   Assist Walk 150 feet activity did not occur: Safety/medical concerns (fatigue)  Assist level: Minimal Assistance - Patient >  75% Assistive device: Walker-rolling    Walk 10 feet on uneven surface  activity   Assist Walk 10 feet on uneven surfaces activity did not occur: Safety/medical concerns         Wheelchair     Assist Is the patient using a wheelchair?: Yes Type of Wheelchair: Manual    Wheelchair assist level: Supervision/Verbal cueing Max wheelchair distance: 50 ft    Wheelchair 50 feet with 2 turns activity    Assist        Assist Level: Supervision/Verbal cueing   Wheelchair 150 feet activity     Assist      Assist Level: Moderate Assistance - Patient 50 - 74%   Blood pressure 127/75, pulse (!) 53, temperature 98.2 F (36.8 C), resp. rate 18, height 5\' 8"  (1.727 m), weight 59.4 kg, SpO2 100%.  Medical Problem List and Plan: 1. Functional deficits secondary to left basal ganglia hemorrhage d/t hypertension and anticoagulation  with subsequent right hemiparesis             -patient may  shower -ELOS/Goals: 12-16 days, goals mod I with PT and OT and independent with SLP -PRAFO while in bed to stretch right heel cord. I suspect he will need an AFO for gait. -Continue CIR therapies including PT, OT   2.  Antithrombotics: -DVT/anticoagulation:  Pharmaceutical: Heparin 5000U q8h- amb distance 125'             -antiplatelet therapy: none   3. Pain Management: Tylenol and robaxin as needed   4. Mood/Behavior/Sleep: LCSW to evaluate and provide emotional support             -antipsychotic agents: n/a -08/25/23 didn't sleep well d/t noises, but will ask nursing to make sure scanner is docked correctly.-- slept better 1/19   5. Neuropsych/cognition: This patient is capable of making decisions on his own behalf although he appears to have some short term memory deficits   6. Skin/Wound Care: Routine skin care checks   7. Fluids/Electrolytes/Nutrition: Routine Is and Os and follow-up chemistries   8: Hypertension/bradycardia: monitor TID and prn. Fair control at  present             -Continue amlodipine 2.5 mg daily -Continue metoprolol tartrate 50 mg twice daily>> decreased to 25mg  BID on 08/17/23 -Sys BP improved, remains bradycardic on beta blocker  -08/18/23 BPs up a little, but they held his metoprolol several days d/t bradycardia. Could consider halving dose of metoprolol, but will see how his BPs go the next few days -08/19/23 BP still a little up, HR upper 50s-60s consistently despite several skipped doses of metoprolol-- doubt we need to decrease it now, continue current dose, if bradycardia worsens then could consider lowering it, but might then need to increase amlodipine for improved BP control  -Will decrease lopressor to 12.5mg  BID , monitor HR and BP-  HR in 60s this am monitor  -1/17 HR 80 today. Bp with fair control. Pt asymptomatic. Continue 12.5mg  lopressor  -1/18-19/25 BP fine, HR pretty good, cont regimen Vitals:   08/23/23 0456 08/23/23 1515 08/23/23 1942 08/23/23 2050  BP: 127/71 127/72 135/77 135/77   08/24/23 0434 08/24/23 1258 08/24/23 1923 08/24/23 2105  BP: 112/65 130/85 134/78 132/70   08/25/23 0610 08/25/23 1322 08/25/23 2011 08/26/23 0513  BP: 137/75 131/73 124/68 127/75    9: Hyperlipidemia: continue Lipitor 80 mg daily   10: Hypothyroidism: Continue Synthroid 125 mcg every morning   11: Tobacco use: Cessation counseling   12: Atrial fibrillation: Now off Eliquis due to ICH.  Will follow-up with neurology in 2 weeks to consider aspire trial, restart Eliquis in 2 weeks if stable and follow-up with cardiology. Continue amiodarone 200mg  daily  -rate is controlled   13: Chronic leukopenia: Follow-up CBC- stable WBCs    14: Mild thrombocytopenia: Follow-up CBC      LOS: 12 days A FACE TO FACE EVALUATION WAS PERFORMED  [address] [place] Creed Kail 08/26/2023, 8:20 AM

## 2023-08-26 NOTE — Plan of Care (Signed)
  Problem: Consults Goal: RH STROKE PATIENT EDUCATION Description: See Patient Education module for education specifics  Outcome: Progressing   Problem: RH BOWEL ELIMINATION Goal: RH STG MANAGE BOWEL WITH ASSISTANCE Description: STG Manage Bowel with mod I Assistance. Outcome: Progressing   Problem: RH SAFETY Goal: RH STG ADHERE TO SAFETY PRECAUTIONS W/ASSISTANCE/DEVICE Description: STG Adhere to Safety Precautions With cues Outcome: Progressing   Problem: RH KNOWLEDGE DEFICIT Goal: RH STG INCREASE KNOWLEDGE OF DIABETES Outcome: Progressing

## 2023-08-27 ENCOUNTER — Inpatient Hospital Stay (HOSPITAL_COMMUNITY): Payer: Medicare Other

## 2023-08-27 ENCOUNTER — Telehealth: Payer: Self-pay | Admitting: Cardiology

## 2023-08-27 DIAGNOSIS — I610 Nontraumatic intracerebral hemorrhage in hemisphere, subcortical: Secondary | ICD-10-CM | POA: Diagnosis not present

## 2023-08-27 DIAGNOSIS — I482 Chronic atrial fibrillation, unspecified: Secondary | ICD-10-CM | POA: Diagnosis not present

## 2023-08-27 DIAGNOSIS — I10 Essential (primary) hypertension: Secondary | ICD-10-CM | POA: Diagnosis not present

## 2023-08-27 DIAGNOSIS — Z720 Tobacco use: Secondary | ICD-10-CM | POA: Diagnosis not present

## 2023-08-27 LAB — BASIC METABOLIC PANEL
Anion gap: 10 (ref 5–15)
BUN: 20 mg/dL (ref 8–23)
CO2: 25 mmol/L (ref 22–32)
Calcium: 9.2 mg/dL (ref 8.9–10.3)
Chloride: 97 mmol/L — ABNORMAL LOW (ref 98–111)
Creatinine, Ser: 0.99 mg/dL (ref 0.61–1.24)
GFR, Estimated: >60 mL/min (ref >60)
Glucose, Bld: 97 mg/dL (ref 70–99)
Potassium: 4.2 mmol/L (ref 3.5–5.1)
Sodium: 132 mmol/L — ABNORMAL LOW (ref 135–145)

## 2023-08-27 LAB — CBC
HCT: 39.9 % (ref 39.0–52.0)
Hemoglobin: 14.4 g/dL (ref 13.0–17.0)
MCH: 34.6 pg — ABNORMAL HIGH (ref 26.0–34.0)
MCHC: 36.1 g/dL — ABNORMAL HIGH (ref 30.0–36.0)
MCV: 95.9 fL (ref 80.0–100.0)
Platelets: 185 10*3/uL (ref 150–400)
RBC: 4.16 MIL/uL — ABNORMAL LOW (ref 4.22–5.81)
RDW: 11.9 % (ref 11.5–15.5)
WBC: 2.5 10*3/uL — ABNORMAL LOW (ref 4.0–10.5)
nRBC: 0 % (ref 0.0–0.2)

## 2023-08-27 MED ORDER — APIXABAN 5 MG PO TABS
5.0000 mg | ORAL_TABLET | Freq: Two times a day (BID) | ORAL | Status: DC
  Administered 2023-08-27 – 2023-09-05 (×18): 5 mg via ORAL
  Filled 2023-08-27 (×18): qty 1

## 2023-08-27 NOTE — Progress Notes (Addendum)
Speech Language Pathology Daily Session Note  Patient Details  Name: Adrian [name] MRN: [id number] Date of Birth: 1956-05-18  Today's Date: 08/27/2023 SLP Individual Time: 0104-0147 SLP Individual Time Calculation (min): 43 min  Short Term Goals: Week 2: SLP Short Term Goal 1 (Week 2): Patient will increase speech intelligibility to 100% during conversation with use of strategies given supervision multimodal A SLP Short Term Goal 2 (Week 2): Patient will utilize compensatory strategies to increase word finding during abstract thoughts given supervision multimodal A SLP Short Term Goal 3 (Week 2): Patient will demonstrate problem solving during mildly complex functional situations given supervision multimodal A SLP Short Term Goal 4 (Week 2): Patient will recall and utilize memory compensatory aids given supervision multimodal A  Skilled Therapeutic Interventions:  Patient was seen in PM to address cognitive re- training, word finding, and speech intelligibility. Pt was alert and finishing up lunch meal upon SLP arrival. When asked he recalled swallowing strategies with min A. SLP reviewed WRAP compensatory strategies and examples of utilization. SLP challenged pt in paragraph retention task through presentation of complex information. SLP guided pt in use of repetition and association strategies. Given a 20 minute delay pt recalled information with 50% acc which did not improve with additional cues. SLP further addressed word finding through review of circumlocution strategies. SLP challenged pt in use of strategies through conveying an unknown topic to listener utilizing circumlocution strategies. Pt completed task with sup A. SLP also challenged pt in speech intelligibility through conversational exchange. Pt with x2 instances of anomia with ability to recover in both attempts given sup A. He was 100% intelligible at the conversation level given sup A. At conclusion of session, pt was left in bed  with call button within reach. SLP to continue POC.   Pain Pain Assessment Pain Scale: 0-10 Pain Score: 0-No pain  Therapy/Group: Individual Therapy  Renaee Munda 08/27/2023, 1:43 PM

## 2023-08-27 NOTE — Progress Notes (Signed)
Occupational Therapy Session Note  Patient Details  Name: WICK MOSELY MRN: [id number] Date of Birth: Apr 24, 1956  Today's Date: 08/27/2023 OT Individual Time: 1015-1100 OT Individual Time Calculation (min): 45 min    Short Term Goals: Week 1:  OT Short Term Goal 1 (Week 1): Pt will be able to pick up finger foods to feed self with min A OT Short Term Goal 1 - Progress (Week 1): Met OT Short Term Goal 2 (Week 1): Pt will perform sit to stands in ADL without UE support wtih contact guard demonstrating improved balance OT Short Term Goal 2 - Progress (Week 1): Met OT Short Term Goal 3 (Week 1): Pt will don LB clothing including footwear with min A OT Short Term Goal 3 - Progress (Week 1): Met OT Short Term Goal 4 (Week 1): pt will perform toileting with min A for balance OT Short Term Goal 4 - Progress (Week 1): Met Week 2:  OT Short Term Goal 1 (Week 2): pt will demonstrate improved balance to don pants with supervision only. OT Short Term Goal 2 (Week 2): Pt will demonstrate improved R hand FMC to cut meat without A. OT Short Term Goal 3 (Week 2): Pt will demonstrate improved R shoulder AROM to be able to don a shirt without needing to use hemidressing techniques.      Skilled Therapeutic Interventions/Progress Updates:    Pt received in bed ready to participate. Pt got up and donned socks and shoes from EOB with no assist using R hand actively.  He  ambulated with RW to toilet. Sat on regular toilet and completed all tasks with close Supervision. Used hand well to pull up pants.  Walked to sink to complete oral care and washing hands. Pt able to grasp paper towels with tip pinch and pull them out.     Pt engaged in NMR activities to facilitate motor control and strength.  Pt used RW to ambulate to wall.  Pt placed hands on wall in push up position and then cued to step feet back from wall. Cued pt to close his eyes and visualize that he was moving his elbows into a push down and then  extending elbows into push up. Pt did that visualization and then proceeded with the activity of completing 12 push ups on the wall demonstrating strong elbow extension.  Had pt step closer to the wall and visualize he was fully extending his hand over head by sliding hand up the wall, then he repeated with AROM able to reach fully over head.  Pt sat in arm chair to work on an active pulling shoulder retraction exercise using the red band.  Pt then worked on R shoulder strength by placing B wrists inside belt loop and actively pulling belt apart, with this tension he raised arms over head. Cued to keep palms in neutral vs pronation.    Pt continues to make excellent gains in therapy.   Pt resting in bed with all needs met. Alarm set and call light in reach.    Therapy Documentation Precautions:  Precautions Precautions: Fall Precaution Comments: R hemi, mild ataxia Restrictions Weight Bearing Restrictions Per Provider Order: No    Pain: no c/o pain   ADL: ADL Eating: Set up Grooming: Setup Where Assessed-Grooming: Sitting at sink Upper Body Bathing: Supervision/safety Where Assessed-Upper Body Bathing: Shower Lower Body Bathing: Contact guard Where Assessed-Lower Body Bathing: Shower Upper Body Dressing: Supervision/safety Where Assessed-Upper Body Dressing: Chair Lower Body Dressing: Contact guard  Where Assessed-Lower Body Dressing: Chair Toileting: Contact guard Where Assessed-Toileting: Teacher, adult education: Curator Method: Risk manager: Insurance underwriter Method: Stand pivot   Therapy/Group: Individual Therapy  Michoel Kunin 08/27/2023, 10:28 AM

## 2023-08-27 NOTE — Addendum Note (Signed)
Addended by: Geralyn Flash D on: 08/27/2023 12:31 PM   Modules accepted: Orders

## 2023-08-27 NOTE — Progress Notes (Signed)
PHARMACY - ANTICOAGULATION CONSULT NOTE  Pharmacy Consult for apixaban Indication: atrial fibrillation  No Known Allergies  Patient Measurements: Height: 5\' 8"  (172.7 cm) Weight: 59.4 kg (130 lb 15.3 oz) IBW/kg (Calculated) : 68.4   Vital Signs: Temp: 97.7 F (36.5 C) (01/20 1357) Temp Source: Oral (01/20 1357) BP: 130/71 (01/20 1357) Pulse Rate: 56 (01/20 1357)  Labs: Recent Labs    08/27/23 0450  HGB 14.4  HCT 39.9  PLT 185  CREATININE 0.99    Estimated Creatinine Clearance: 60.8 mL/min (by C-G formula based on SCr of 0.99 mg/dL).   Medical History: Past Medical History:  Diagnosis Date   AF (paroxysmal atrial fibrillation) (HCC) 11/29/2018   Atrial flutter (HCC) 10/12/2015   a. TEE 3/17 with ? LAA clot-->s/p TEE/DCCV 11/24/2015; s/p DCCV 12/2022;s/p ablation 06/2023   CAD (coronary artery disease), native coronary artery 12/06/2015   a. 10/2015 MV: EF  37%, reversible defect inferior apex, intermediate risk findings; b. 10/2015 Cath: 20% mid RCA.   Carotid artery stenosis    1-39% right ICA stenosis and occluded left ICA   Colonic diverticular abscess    Diverticulitis    History of chemotherapy 2005   Cisplatin   Hypothyroidism    NICM (nonischemic cardiomyopathy) (HCC) 10/14/2015   a. Tachy mediated?;  b. Echo 3/17 - Mild concentric LVH, EF 30-35%, anteroseptal, anterior, anterolateral, apical anterior, lateral hypokinesis, trivial MR, mild to moderately reduced RVSF; c. LHC 3/17 - mRCA 20%   Radiation NOv.3,2005-Dec. 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  XRT    Medications:  Medications Prior to Admission  Medication Sig Dispense Refill Last Dose/Taking   amiodarone (PACERONE) 200 MG tablet Take 200 mg by mouth daily.      amLODipine (NORVASC) 2.5 MG tablet Take 1 tablet (2.5 mg total) by mouth daily.      apixaban (ELIQUIS) 5 MG TABS tablet Take 5 mg by mouth 2 (two) times daily.      atorvastatin (LIPITOR) 80  MG tablet Take 1 tablet (80 mg total) by mouth daily.      levothyroxine (SYNTHROID) 125 MCG tablet TAKE 1 TABLET BY MOUTH EVERY DAY 90 tablet 0    metoprolol tartrate (LOPRESSOR) 50 MG tablet Take 1 tablet (50 mg total) by mouth 2 (two) times daily. 180 tablet 3    senna-docusate (SENOKOT-S) 8.6-50 MG tablet Take 1 tablet by mouth 2 (two) times daily.       Assessment: 61 YOM admitted to CIR s/p IP admission for large basal ganglia ICH w/ IVH extension. Neuro has recommended restarting apixaban at this time.   Goal of Therapy:  Monitor platelets by anticoagulation protocol: Yes   Plan:  Restart apixaban 5 mg po bid Monitor for signs of bleeding Pharmacy will sign off consult but continue to monitor making recs prn Thank you   Greta Doom BS, PharmD, BCPS Clinical Pharmacist 08/27/2023 2:07 PM  Contact: [phone] after 3 PM  "Be curious, not judgmental..." -Debbora Dus

## 2023-08-27 NOTE — Progress Notes (Addendum)
PROGRESS NOTE   Subjective/Complaints:  No issues overnite , discussed anticoag for CVA risk reduction in the setting of A fib   ROS: as per HPI. Denies CP, SOB, abd pain, N/V/D/C, or any other complaints at this time.    Objective:   No results found.  Recent Labs    08/27/23 0450  WBC 2.5*  HGB 14.4  HCT 39.9  PLT 185     Recent Labs    08/27/23 0450  NA 132*  K 4.2  CL 97*  CO2 25  GLUCOSE 97  BUN 20  CREATININE 0.99  CALCIUM 9.2      Intake/Output Summary (Last 24 hours) at 08/27/2023 0844 Last data filed at 08/27/2023 0808 Gross per 24 hour  Intake 480 ml  Output 850 ml  Net -370 ml        Physical Exam: Vital Signs Blood pressure 130/74, pulse (!) 51, temperature (!) 97.5 F (36.4 C), resp. rate 18, height 5\' 8"  (1.727 m), weight 59.4 kg, SpO2 99%.   General: No acute distress Mood and affect are appropriate Heart: Regular rate and rhythm no rubs murmurs or extra sounds Lungs: Clear to auscultation, breathing unlabored, no rales or wheezes Abdomen: Positive bowel sounds, soft nontender to palpation, nondistended Extremities: No clubbing, cyanosis, or edema Skin: No evidence of breakdown, no evidence of rash Neurologic: Cranial nerves II through XII intact, Cerebellar exam normal finger to nose to finger as well as heel to shin in bilateral upper and lower extremities Musculoskeletal: Full range of motion in all 4 extremities. No joint swelling  Skin: No evidence of breakdown, no evidence of rash over exposed surfaces MsK: AFO on RLE-- not donned today   Neurologic: Cranial nerves--with right central VII, tongue deviation, motor strength is 5/5 in left and 3- R deltoid, bicep, tricep, grip,5/5 left and 3- right  hip flexor, knee extensors,  1/5 on right ADF/PF.  5/5 left ankle dorsiflexor and plantar flexor Sensory exam normal sensation to light touch  in bilateral upper and lower  extremities Proprioception ? Reduced RUE vs naming issues due to aphasia  Intact finger to thumb opposition but slow on Right side - Musculoskeletal: Full range of motion in all 4 extremities. No joint swelling   Assessment/Plan: 1. Functional deficits which require 3+ hours per day of interdisciplinary therapy in a comprehensive inpatient rehab setting. Physiatrist is providing close team supervision and 24 hour management of active medical problems listed below. Physiatrist and rehab team continue to assess barriers to discharge/monitor patient progress toward functional and medical goals  Care Tool:  Bathing    Body parts bathed by patient: Right arm, Chest, Abdomen, Front perineal area, Right upper leg, Buttocks, Left upper leg, Right lower leg, Left lower leg, Face, Left arm   Body parts bathed by helper: Left arm     Bathing assist Assist Level: Contact Guard/Touching assist     Upper Body Dressing/Undressing Upper body dressing   What is the patient wearing?: Pull over shirt    Upper body assist Assist Level: Supervision/Verbal cueing    Lower Body Dressing/Undressing Lower body dressing      What is the patient wearing?: Pants,  Underwear/pull up     Lower body assist Assist for lower body dressing: Minimal Assistance - Patient > 75%     Toileting Toileting    Toileting assist Assist for toileting: Contact Guard/Touching assist     Transfers Chair/bed transfer  Transfers assist     Chair/bed transfer assist level: Minimal Assistance - Patient > 75%     Locomotion Ambulation   Ambulation assist      Assist level: Contact Guard/Touching assist Assistive device: Walker-rolling Max distance: 150 ft   Walk 10 feet activity   Assist     Assist level: Minimal Assistance - Patient > 75% Assistive device: No Device   Walk 50 feet activity   Assist Walk 50 feet with 2 turns activity did not occur: Safety/medical concerns (fatigue)  Assist  level: Contact Guard/Touching assist Assistive device: Walker-rolling    Walk 150 feet activity   Assist Walk 150 feet activity did not occur: Safety/medical concerns (fatigue)  Assist level: Minimal Assistance - Patient > 75% Assistive device: Walker-rolling    Walk 10 feet on uneven surface  activity   Assist Walk 10 feet on uneven surfaces activity did not occur: Safety/medical concerns         Wheelchair     Assist Is the patient using a wheelchair?: Yes Type of Wheelchair: Manual    Wheelchair assist level: Supervision/Verbal cueing Max wheelchair distance: 50 ft    Wheelchair 50 feet with 2 turns activity    Assist        Assist Level: Supervision/Verbal cueing   Wheelchair 150 feet activity     Assist      Assist Level: Moderate Assistance - Patient 50 - 74%   Blood pressure 130/74, pulse (!) 51, temperature (!) 97.5 F (36.4 C), resp. rate 18, height 5\' 8"  (1.727 m), weight 59.4 kg, SpO2 99%.  Medical Problem List and Plan: 1. Functional deficits secondary to left basal ganglia hemorrhage 08/11/2023 d/t hypertension and anticoagulation  with subsequent right hemiparesis             -patient may  shower -ELOS/Goals: 09/05/23 days, goals mod I with PT and OT and independent with SLP -PRAFO while in bed to stretch right heel cord. I suspect he will need an AFO for gait. -Continue CIR therapies including PT, OT  >2wks post ICH , Dr Roda Shutters , neuro rec repeat CT head - this was reviewed by Dr Roda Shutters as well as Pearlean [name].  Neuro recommends start Eliquis and neuro f/u as OP  2.  Antithrombotics: -DVT/anticoagulation:  Pharmaceutical: Heparin d/ced amb distance 125'             -antiplatelet therapy: none   3. Pain Management: Tylenol and robaxin as needed   4. Mood/Behavior/Sleep: LCSW to evaluate and provide emotional support             -antipsychotic agents: n/a    5. Neuropsych/cognition: This patient is capable of making decisions on his own behalf  although he appears to have some short term memory deficits   6. Skin/Wound Care: Routine skin care checks   7. Fluids/Electrolytes/Nutrition: Routine Is and Os and follow-up chemistries   8: Hypertension/bradycardia: monitor TID and prn. Fair control at present             -Continue amlodipine 2.5 mg daily -Continue metoprolol tartrate  decreased to 12.5mg  BID on 08/20/23 Controlled 1/20 Vitals:   08/23/23 2050 08/24/23 0434 08/24/23 1258 08/24/23 1923  BP: 135/77 112/65 130/85 134/78  08/24/23 2105 08/25/23 0610 08/25/23 1322 08/25/23 2011  BP: 132/70 137/75 131/73 124/68   08/26/23 0513 08/26/23 1310 08/26/23 1950 08/27/23 0433  BP: 127/75 129/83 130/62 130/74    9: Hyperlipidemia: continue Lipitor 80 mg daily   10: Hypothyroidism: Continue Synthroid 125 mcg every morning   11: Tobacco use: Cessation counseling   12: Atrial fibrillation: Now off Eliquis due to ICH.  Reached out to neurology to consider ASPIRE trial, restart Eliquis in 2 weeks if stable and follow-up with cardiology. Continue amiodarone 200mg  daily  -rate is controlled   13: Chronic leukopenia: Follow-up CBC- stable WBCs    14: Mild thrombocytopenia: Follow-up CBC      LOS: 13 days A FACE TO FACE EVALUATION WAS PERFORMED  Erick Colace 08/27/2023, 8:44 AM

## 2023-08-27 NOTE — Telephone Encounter (Signed)
Wife [name]) reported patient is in hospital and wants a call back to confirm device reading was received.

## 2023-08-27 NOTE — Progress Notes (Signed)
Bsx Loop Recorder

## 2023-08-28 ENCOUNTER — Encounter: Payer: BC Managed Care – PPO | Admitting: Vascular Surgery

## 2023-08-28 NOTE — Progress Notes (Addendum)
Physical Therapy Session Note  Patient Details  Name: Adrian [name] MRN: [id number] Date of Birth: [birth year]  Today's Date: 08/27/2023 PT Individual Time:  1503-1605  PT Individual Time Calculation (min): 62 min  Short Term Goals: Week 1:  PT Short Term Goal 1 (Week 1): pt will perform sit to stand with LRAD and supervision PT Short Term Goal 1 - Progress (Week 1): Met PT Short Term Goal 2 (Week 1): pt will perform bed to chair transfer with LRAD and supervision PT Short Term Goal 2 - Progress (Week 1): Progressing toward goal PT Short Term Goal 3 (Week 1): pt will ambulate 50 feet with LRAD and min A PT Short Term Goal 3 - Progress (Week 1): Met Week 2:  PT Short Term Goal 1 (Week 2): Pt will perform stand pivot transfer with LRAD and supervision PT Short Term Goal 2 (Week 2): Pt will ambulate at least 100 feet using LRAD with light CGA. PT Short Term Goal 3 (Week 2): Pt will navigate at least 4 steps using BHR with overall minA. PT Short Term Goal 4 (Week 2): Pt will improve score on Berg Balance Test by at least the [facility]. PT Short Term Goal 5 (Week 2): Pt will improve quality of gait with ability to spend increased time on RLE in order to advance LLE past RLE consistently.  Skilled Therapeutic Interventions/Progress Updates:  Patient supine in bed on entrance to room. Patient alert and agreeable to PT session.   Patient with no pain complaint at start of session. But does relate that foot has pulled out of shoe for AFO.   Discussed need for AFO after d/c in order to improve pt's quality of gait and safety with gait. Pt demos understanding. Also discussed potential for outing on Wed morning with Recreation therapist. Pt talks with ex-wife on phone and requests her to bring a coat and hat for outing.   Therapeutic Activity: Bed Mobility: Pt performed supine <> sit with Mod I/ supervision. No cueing for technique. AFO and shoe removed and re-donned.  Transfers: Pt performed sit<>stand  with focus on technique and supervision. Is able to complete throughout session with no UE use and minimal knee extension assist into seated surface. Stand pivot transfers throughout session using RW with close supervision/ CGA. Improved with AFO donned.  Gait Training:  Pt ambulated >200 ft to reach main therapy gym using RW with light CGA/ close supervision. During initial part of gait, pt noted to continue to invert ankle and on reaching 150 ft pt sits in order to doff and re-don shoe and AFO for improved fit. Provided vc/ tc for focus to press medial aspect of ball and heel of foot into ground with heel-to-toe progression. Continues to demo reduced control of ER at hip and decreased hip flexion strength.   Neuromuscular Re-ed: NMR facilitated during session with focus on standing balance, proprioception, proactive/ reactive balance. Pt guided in toss of bean bags to target board x3 bouts. First bout with static standing but sit<>stand for each beanbag grasp. Then final 2 bouts with L foot forward with RUE toss. Improved quality of balance with learning and stance. After first bout, pt provided with reacher and is able to retrieve 8 bags from top of board and pass to therapist. No LOB and minimal instruction/ cues required. NMR performed for improvements in motor control and coordination, balance, sequencing, judgement, and self confidence/ efficacy in performing all aspects of mobility at highest level of independence.  Patient seated on toilet at end of session and NT notified as to pt's disposition and handover of care to NT. Reminded pt to pull call light for assist when ready.    Therapy Documentation Precautions:  Precautions Precautions: Fall Precaution Comments: R hemi, mild ataxia Restrictions Weight Bearing Restrictions Per Provider Order: No  Pain:  No pain related this session.    Therapy/Group: Individual Therapy  Loel Dubonnet PT, DPT, CSRS 08/27/2023, 5:32 PM

## 2023-08-28 NOTE — Progress Notes (Signed)
Occupational Therapy Weekly Progress Note  Patient Details  Name: Adrian [name] MRN: [id number] Date of Birth: 1955-12-30  Beginning of progress report period: August 21, 2023 End of progress report period: August 28, 2023  Today's Date: 08/28/2023 OT Individual Time: 1115-1200 OT Individual Time Calculation (min): 45 min    Patient has met 3 of 3 short term goals.  Pt is making excellent progress with use of his RUE. Pt is now actively using R hand in [facility] and [facility].  He requires supervision overall with goals of reaching a modified independence level by next week.    Patient continues to demonstrate the following deficits: unbalanced muscle activation and decreased coordination and decreased standing balance and hemiplegia and therefore will continue to benefit from skilled OT intervention to enhance overall performance with BADL and iADL.  Patient progressing toward long term goals..  Continue plan of care.  LTGs bathing and dressing upgraded to Modified independence.   OT Short Term Goals Week 1:  OT Short Term Goal 1 (Week 1): Pt will be able to pick up finger foods to feed self with min A OT Short Term Goal 1 - Progress (Week 1): Met OT Short Term Goal 2 (Week 1): Pt will perform sit to stands in ADL without UE support wtih contact guard demonstrating improved balance OT Short Term Goal 2 - Progress (Week 1): Met OT Short Term Goal 3 (Week 1): Pt will don LB clothing including footwear with min A OT Short Term Goal 3 - Progress (Week 1): Met OT Short Term Goal 4 (Week 1): pt will perform toileting with min A for balance OT Short Term Goal 4 - Progress (Week 1): Met Week 2:  OT Short Term Goal 1 (Week 2): pt will demonstrate improved balance to don pants with supervision only. OT Short Term Goal 1 - Progress (Week 2): Met OT Short Term Goal 2 (Week 2): Pt will demonstrate improved R hand [facility] to cut meat without A. OT Short Term Goal 2 - Progress (Week 2): Met OT Short Term Goal 3  (Week 2): Pt will demonstrate improved R shoulder AROM to be able to don a shirt without needing to use hemidressing techniques. OT Short Term Goal 3 - Progress (Week 2): Met Week 3:  OT Short Term Goal 1 (Week 3): STGS = LTGs  Skilled Therapeutic Interventions/Progress Updates:    Pt seen this session to focus on RUE NMR.  Pt first needed to toilet. Ambulated to bathroom without RW with min A.  Toileted with supervision using R hand to fasten jeans and pull up zipper.  He was taken to gym via w/c. Pt engaged in numerous RUE NMR challenges : Standing and picking up a laundry basket with 7 lbs of weights using B hands. Sitting and lifting basket onto a tall table.  Zoom ball for shoulder abduction.  Rotating a large hula hoop for sh flexion.  Basket ball bounce and catch and toss and catch for B coordination and speed.  R shoulder retraction pulls with theraband.  Excellent progress with RUE.   Pt returned to room in w.c.    Had pt work on B coordination with moving wc in various directions. Pt in room with all needs met.   Therapy Documentation Precautions:  Precautions Precautions: Fall Precaution Comments: R hemi, mild ataxia Restrictions Weight Bearing Restrictions Per Provider Order: No Pain: Pain Assessment Pain Scale: 0-10 Pain Score: 0-No pain ADL: ADL Eating: Set up Grooming: Setup Where Assessed-Grooming: Sitting  at sink Upper Body Bathing: Supervision/safety Where Assessed-Upper Body Bathing: Shower Lower Body Bathing: Supervision/safety Where Assessed-Lower Body Bathing: Shower Upper Body Dressing: Supervision/safety Where Assessed-Upper Body Dressing: Chair Lower Body Dressing: Supervision/safety Where Assessed-Lower Body Dressing: Chair Toileting: Supervision/safety Where Assessed-Toileting: Teacher, adult education: Furniture conservator/restorer Method: Biomedical scientist Method: Ambulating   Therapy/Group:  Individual Therapy  Kaliana Albino 08/28/2023, 1:04 PM

## 2023-08-28 NOTE — Progress Notes (Signed)
Speech Language Pathology Daily Session Note  Patient Details  Name: Adrian [name] MRN: [id number] Date of Birth: 19-Jun-1956  Today's Date: 08/28/2023 SLP Individual Time: 1400-1444 SLP Individual Time Calculation (min): 44 min  Short Term Goals: Week 2: SLP Short Term Goal 1 (Week 2): Patient will increase speech intelligibility to 100% during conversation with use of strategies given supervision multimodal A SLP Short Term Goal 2 (Week 2): Patient will utilize compensatory strategies to increase word finding during abstract thoughts given supervision multimodal A SLP Short Term Goal 3 (Week 2): Patient will demonstrate problem solving during mildly complex functional situations given supervision multimodal A SLP Short Term Goal 4 (Week 2): Patient will recall and utilize memory compensatory aids given supervision multimodal A  Skilled Therapeutic Interventions: Skilled SLP session focused on cognitive goals. SLP faciliated session by listing locations in hospital for patient to identify. Patient utilized hospital signs to locate prompted places including the gift shop, main entrance, cafeteria and [employer]. Patient required supervision-minA to complete this task. Upon return to room, patient recalled all locations found independently, however with more difficulty recalling details such as types of food stations in the cafeteria and floor levels of different locations. Patient independently aware of difficulty. Patient left in bed with alarm set and call bell in reach. Continue POC.   Pain Denies  Therapy/Group: Individual Therapy  Warnie Belair M.A., CF-SLP 08/28/2023, 7:45 AM

## 2023-08-28 NOTE — Progress Notes (Signed)
Physical Therapy Session Note  Patient Details  Name: Adrian [name] MRN: [id number] Date of Birth: October 07, 1955  Today's Date: 08/28/2023 PT Individual Time: 0911-1006 PT Individual Time Calculation (min): 55 min   Short Term Goals: Week 2:  PT Short Term Goal 1 (Week 2): Pt will perform stand pivot transfer with LRAD and supervision PT Short Term Goal 2 (Week 2): Pt will ambulate at least 100 feet using LRAD with light CGA. PT Short Term Goal 3 (Week 2): Pt will navigate at least 4 steps using BHR with [name] minA. PT Short Term Goal 4 (Week 2): Pt will improve score on Berg Balance Test by at least the [facility]. PT Short Term Goal 5 (Week 2): Pt will improve quality of gait with ability to spend increased time on RLE in order to advance LLE past RLE consistently.  Skilled Therapeutic Interventions/Progress Updates: Patient supine in bed on entrance to room. Patient alert and agreeable to PT session.   Patient reported no pain.  Therapeutic Activity: Bed Mobility: Pt performed supine<>sit on EOB with supervision/modI. Pt donned/doffed personal shoe (L) and sock (B) with supervision while sitting EOB without LOB. PTA donned R shoe and AFO. Transfers: Pt performed sit<>stand transfers throughout session with no AD and with CGA for safety. Provided VC for increased step clearance on R LE when pivoting.  Gait Training:  Pt ambulated roughly 180' from room<day room gym using RW with CGA. Pt with slight hyperextension with VC to control soft bend in R quadriceps. Pt with increase in stance time on R LE this session than in previous sessions with this PTA, but decreased once cued to control eccentric contraction of R quadriceps.   Neuromuscular Re-ed: NMR facilitated during session with focus on muscle energy techniques, neuromuscular connection of R LE, proprioception feedback. - Step to 6" step with 7lb ankle weight donned on L LE in order to increase WB and proprioceptive feedback on R LE. Pt with  L HHA and with minA to maintain standing balance. Pt performed until fatigue with a few reps performed without UE support with heavy minA. 2 rounds performed with same level of assistance - Contraction of 3-5 isometric hold of R HS followed by passive stretch (pt reported R HS were tight previously). Contract-relax performed in this manner to increase R knee ROM and quadriceps activation - Contract-relax (Muscle Energy) of R quadriceps. Manual mod resistance.  - x 5 reps of LAQ on R LE following 2 rounds of this intervention (VC to control movement concentrically and eccentrically) - Alternating quadriceps and HS contraction with manual min resistance on HS, and light mod resistance on quadriceps.   - x 10 reps of LAQ on R LE following 2 rounds  (VC to control movement concentrically and eccentrically)  NMR performed for improvements in motor control and coordination, balance, sequencing, judgement, and self confidence/ efficacy in performing all aspects of mobility at highest level of independence.   Patient supine in bed at end of session with brakes locked, bed alarm set, and all needs within reach.      Therapy Documentation Precautions:  Precautions Precautions: Fall Precaution Comments: R hemi, mild ataxia Restrictions Weight Bearing Restrictions Per Provider Order: No  Therapy/Group: Individual Therapy  Saryna Kneeland PTA 08/28/2023, 12:25 PM

## 2023-08-28 NOTE — Plan of Care (Signed)
  Problem: RH Bathing Goal: LTG Patient will bathe all body parts with assist levels (OT) Description: LTG: Patient will bathe all body parts with assist levels (OT) Flowsheets (Taken 08/28/2023 1258) LTG: Pt will perform bathing with assistance level/cueing: Independent with assistive device  LTG: Position pt will perform bathing: Shower   Problem: RH Dressing Goal: LTG Patient will perform lower body dressing w/assist (OT) Description: LTG: Patient will perform lower body dressing with assist, with/without cues in positioning using equipment (OT) Flowsheets (Taken 08/28/2023 1258) LTG: Pt will perform lower body dressing with assistance level of: Independent with assistive device

## 2023-08-28 NOTE — Progress Notes (Signed)
Physical Therapy Session Note  Patient Details  Name: Adrian [name] MRN: [id number] Date of Birth: 10/22/1955  Today's Date: 08/28/2023 PT Individual Time:  1302-1348  PT Individual Time Calculation (min): 46 min  Short Term Goals: Week 1:  PT Short Term Goal 1 (Week 1): pt will perform sit to stand with LRAD and supervision PT Short Term Goal 1 - Progress (Week 1): Met PT Short Term Goal 2 (Week 1): pt will perform bed to chair transfer with LRAD and supervision PT Short Term Goal 2 - Progress (Week 1): Progressing toward goal PT Short Term Goal 3 (Week 1): pt will ambulate 50 feet with LRAD and min A PT Short Term Goal 3 - Progress (Week 1): Met Week 2:  PT Short Term Goal 1 (Week 2): Pt will perform stand pivot transfer with LRAD and supervision PT Short Term Goal 2 (Week 2): Pt will ambulate at least 100 feet using LRAD with light CGA. PT Short Term Goal 3 (Week 2): Pt will navigate at least 4 steps using BHR with overall minA. PT Short Term Goal 4 (Week 2): Pt will improve score on Berg Balance Test by at least the [facility]. PT Short Term Goal 5 (Week 2): Pt will improve quality of gait with ability to spend increased time on RLE in order to advance LLE past RLE consistently.  Skilled Therapeutic Interventions/Progress Updates:  Patient seated upright on EOB on entrance to room. CPO, Olivia present from [facility] for ambulation assessment for AFO.  Patient alert and agreeable to PT session.   Patient with no pain complaint at start of session.  Gait Training:  Pt ambulated 200' x1 with no AFO donned, 135' x1 with PLS Walk-On [place] AFO with medial strut donned, then 135' x1 with [place] PLS Sprystep AFO with lateral strut donned. Pt used RW throughout with overall supervision/ intermittent CGA. On initial bout demo'd improved foot clearance of RLE d/t use of adductors to advance LE. Continued but improved overall passive ER. No longer sliding medial aspect of R foot forward.  Increased uncontrolled hyperextension with R knee when fatigued.   After all ambulation, determined that most improvement noted with [place] PLS Sprystep AFO with lateral strut. CPO recommended taller height in attempt to reduce hyperextension of knee. If necessary, may also add very lo wedge to lateral heel to decrease tendency to invert foot.   D/t fatigue, pt returned to room via w/c. Relates need to toilet.   Patient seated on toilet at end of session, and pull cord within reach. Reminded to use cord to alert when toileting is completed. NT notified as to pt's disposition and need for assist from bathroom and back to bed soon.    Therapy Documentation Precautions:  Precautions Precautions: Fall Precaution Comments: R hemi, mild ataxia Restrictions Weight Bearing Restrictions Per Provider Order: No  Pain:  No pain related this session.   Therapy/Group: Individual Therapy  Loel Dubonnet PT, DPT, CSRS 08/28/2023, 8:54 AM

## 2023-08-28 NOTE — Progress Notes (Signed)
Orthopedic Tech Progress Note Patient Details:  CASTIEL MANWILLER 09/06/1955 [id number]  Called in order to [facility] for an AFO CONSULT   Patient ID: HASIB CHOATE, male   DOB: 1955-09-02, 68 y.o.   MRN: [id number]  Donald Pore 08/28/2023, 8:54 AM

## 2023-08-28 NOTE — Plan of Care (Signed)
  Problem: Consults Goal: RH STROKE PATIENT EDUCATION Description: See Patient Education module for education specifics  Outcome: Progressing   Problem: RH BOWEL ELIMINATION Goal: RH STG MANAGE BOWEL WITH ASSISTANCE Description: STG Manage Bowel with mod I Assistance. Outcome: Progressing   Problem: RH SAFETY Goal: RH STG ADHERE TO SAFETY PRECAUTIONS W/ASSISTANCE/DEVICE Description: STG Adhere to Safety Precautions With cues Outcome: Progressing   Problem: RH KNOWLEDGE DEFICIT Goal: RH STG INCREASE KNOWLEDGE OF DIABETES Outcome: Progressing   Problem: RH KNOWLEDGE DEFICIT Goal: RH STG INCREASE KNOWLEDGE OF HYPERTENSION Description: Patient manage care re: hypertension at discharge using educational materials independently Outcome: Progressing   Problem: RH KNOWLEDGE DEFICIT Goal: RH STG INCREASE KNOWLEGDE OF HYPERLIPIDEMIA Outcome: Progressing   Problem: RH KNOWLEDGE DEFICIT Goal: RH STG INCREASE KNOWLEDGE OF STROKE PROPHYLAXIS Outcome: Progressing

## 2023-08-28 NOTE — Progress Notes (Signed)
Physical Therapy Session Note  Patient Details  Name: Adrian [name] MRN: [id number] Date of Birth: [birth year]  Today's Date: 08/27/2023 PT Individual Time:  1116-1203  PT Individual Time Calculation (min): 47 min  Short Term Goals: Week 1:  PT Short Term Goal 1 (Week 1): pt will perform sit to stand with LRAD and supervision PT Short Term Goal 1 - Progress (Week 1): Met PT Short Term Goal 2 (Week 1): pt will perform bed to chair transfer with LRAD and supervision PT Short Term Goal 2 - Progress (Week 1): Progressing toward goal PT Short Term Goal 3 (Week 1): pt will ambulate 50 feet with LRAD and min A PT Short Term Goal 3 - Progress (Week 1): Met Week 2:  PT Short Term Goal 1 (Week 2): Pt will perform stand pivot transfer with LRAD and supervision PT Short Term Goal 2 (Week 2): Pt will ambulate at least 100 feet using LRAD with light CGA. PT Short Term Goal 3 (Week 2): Pt will navigate at least 4 steps using BHR with overall minA. PT Short Term Goal 4 (Week 2): Pt will improve score on Berg Balance Test by at least the [facility]. PT Short Term Goal 5 (Week 2): Pt will improve quality of gait with ability to spend increased time on RLE in order to advance LLE past RLE consistently.  Skilled Therapeutic Interventions/Progress Updates:  Patient supine in bed on entrance to room. Patient alert and agreeable to PT session.   Patient with no pain complaint at start of session.  Therapeutic Activity: Bed Mobility: Pt performed supine <> sit with Mod I/ supervision. AFO and R shoe donned with MaxA for time. Pt able to don L shoe with MinA.  Transfers: Pt performed sit<>stand and stand pivot transfers throughout session with close supervision and RW for pivoting. Provided vc/ tc for min technique.  Gait Training:  Pt ambulated >200 ft to main therapy gym using RW with close supervision/ CGA. Demonstrated improved overall quality of gait as compared to ambulation without AFO. However, pt continues to  demo decreased R hip strength including flexion and IR. Provided vc/ tc for maintaining strong but soft knee to prevent uncontrolled hyperextension as well as to lift knee straight forward to prevent uncontrolled ER at hip during advancement.   Neuromuscular Re-ed: NMR facilitated during session with focus on standing balance, motor control. Pt guided in eccentric/ concentric flexion and extension while standing in order to improve strength and control of knee mobility - good control noted throughout with use of red theraband.   Pt also guided in improving ability to shift weight forward to decrease need for momentum build in rise to stand as well as to improve strength/ coordination. Pt guided in proper positioning and need for forward hip hinge to shift weight forward from rear to feet. Pt able to demo rise to stand without need for throwing weight forward. Progressed to sitting/ standing to rolling stool in therapy gym with instructions to perform sit<>stand without moving stool. Stool stabilized by therapist but pt is able to perform with very minimal movement of stool and significant improvement in weight shift and control. Performs on stool x4.   NMR performed for improvements in motor control and coordination, balance, sequencing, judgement, and self confidence/ efficacy in performing all aspects of mobility at highest level of independence.   Patient supine in bed at end of session with brakes locked, maintains shoes donned, bed alarm set, and all needs within reach.  Therapy Documentation Precautions:  Precautions Precautions: Fall Precaution Comments: R hemi, mild ataxia Restrictions Weight Bearing Restrictions Per Provider Order: No  Pain:  No pain related this session.    Therapy/Group: Individual Therapy  Loel Dubonnet PT, DPT, CSRS 08/28/2023, 6:43 AM

## 2023-08-28 NOTE — Progress Notes (Signed)
PROGRESS NOTE   Subjective/Complaints:  No issues overnite , discussed potential to enroll in ASPIRE trial as OP  ROS: as per HPI. Denies CP, SOB, abd pain, N/V/D/C, or any other complaints at this time.    Objective:   CT HEAD WO CONTRAST ( ) Result Date: 08/27/2023 CLINICAL DATA:  Stroke, hemorrhagic EXAM: CT HEAD WITHOUT CONTRAST TECHNIQUE: Contiguous axial images were obtained from the base of the skull through the vertex without intravenous contrast. RADIATION DOSE REDUCTION: This exam was performed according to the departmental dose-optimization program which includes automated exposure control, adjustment of the mA and/or kV according to patient size and/or use of iterative reconstruction technique. COMPARISON:  Head CT 08/12/2023 FINDINGS: Brain: Interval decrease in size of the previously seen hemorrhage in the left basal ganglia, now measuring 10 x 7 mm, previously 20 x 12 mm. There is resolution of intraventricular blood products. No hydrocephalus. No extra-axial fluid collection. No mass effect. No mass lesion. No CT evidence of an acute cortical infarct. There is chronic left MCA territory infarct involving the left opercular region. Vascular: No hyperdense vessel or unexpected calcification. Skull: Normal. Negative for fracture or focal lesion. Sinuses/Orbits: No middle ear or mastoid effusion. Paranasal sinuses are notable for complete opacification of the right frontal sinus. Orbits are unremarkable. Other: None. IMPRESSION: No acute intracranial abnormality. Interval decrease in size of left basal ganglia hemorrhage, now measuring 10 x 7 mm, previously 20 x 12 mm. Resolution of intraventricular blood products. No hydrocephalus. Electronically Signed   By: Lorenza Cambridge M.D.   On: 08/27/2023 13:51    Recent Labs    08/27/23 0450  WBC 2.5*  HGB 14.4  HCT 39.9  PLT 185     Recent Labs    08/27/23 0450  NA 132*  K  4.2  CL 97*  CO2 25  GLUCOSE 97  BUN 20  CREATININE 0.99  CALCIUM 9.2      Intake/Output Summary (Last 24 hours) at 08/28/2023 0734 Last data filed at 08/28/2023 0500 Gross per 24 hour  Intake 680 ml  Output 1075 ml  Net -395 ml        Physical Exam: Vital Signs Blood pressure 113/64, pulse (!) 55, temperature 98.9 F (37.2 C), resp. rate 16, height 5\' 8"  (1.727 m), weight 59.4 kg, SpO2 100%.   General: No acute distress Mood and affect are appropriate Heart: Regular rate and rhythm no rubs murmurs or extra sounds Lungs: Clear to auscultation, breathing unlabored, no rales or wheezes Abdomen: Positive bowel sounds, soft nontender to palpation, nondistended Extremities: No clubbing, cyanosis, or edema Skin: No evidence of breakdown, no evidence of rash Neurologic: Cranial nerves II through XII intact, Cerebellar exam normal finger to nose to finger as well as heel to shin in bilateral upper and lower extremities Musculoskeletal: Full range of motion in all 4 extremities. No joint swelling  Skin: No evidence of breakdown, no evidence of rash over exposed surfaces MsK: AFO on RLE-- not donned today   Neurologic: Cranial nerves--with right central VII, tongue deviation, motor strength is 5/5 in left and 4- R deltoid, bicep, tricep, grip,5/5 left and 3- right  hip flexor, knee extensors,  1/5 on right ADF/PF.  5/5 left ankle dorsiflexor and plantar flexor Sensory exam normal sensation to light touch  in bilateral upper and lower extremities Proprioception  Reduced RUE with sensory ataxia  Intact finger to thumb opposition but slow on Right side - Musculoskeletal: Full range of motion in all 4 extremities. No joint swelling   Assessment/Plan: 1. Functional deficits which require 3+ hours per day of interdisciplinary therapy in a comprehensive inpatient rehab setting. Physiatrist is providing close team supervision and 24 hour management of active medical problems listed  below. Physiatrist and rehab team continue to assess barriers to discharge/monitor patient progress toward functional and medical goals  Care Tool:  Bathing    Body parts bathed by patient: Right arm, Chest, Abdomen, Front perineal area, Right upper leg, Buttocks, Left upper leg, Right lower leg, Left lower leg, Face, Left arm   Body parts bathed by helper: Left arm     Bathing assist Assist Level: Contact Guard/Touching assist     Upper Body Dressing/Undressing Upper body dressing   What is the patient wearing?: Pull over shirt    Upper body assist Assist Level: Supervision/Verbal cueing    Lower Body Dressing/Undressing Lower body dressing      What is the patient wearing?: Pants, Underwear/pull up     Lower body assist Assist for lower body dressing: Minimal Assistance - Patient > 75%     Toileting Toileting    Toileting assist Assist for toileting: Contact Guard/Touching assist     Transfers Chair/bed transfer  Transfers assist     Chair/bed transfer assist level: Minimal Assistance - Patient > 75%     Locomotion Ambulation   Ambulation assist      Assist level: Contact Guard/Touching assist Assistive device: Walker-rolling Max distance: 150 ft   Walk 10 feet activity   Assist     Assist level: Minimal Assistance - Patient > 75% Assistive device: No Device   Walk 50 feet activity   Assist Walk 50 feet with 2 turns activity did not occur: Safety/medical concerns (fatigue)  Assist level: Contact Guard/Touching assist Assistive device: Walker-rolling    Walk 150 feet activity   Assist Walk 150 feet activity did not occur: Safety/medical concerns (fatigue)  Assist level: Minimal Assistance - Patient > 75% Assistive device: Walker-rolling    Walk 10 feet on uneven surface  activity   Assist Walk 10 feet on uneven surfaces activity did not occur: Safety/medical concerns         Wheelchair     Assist Is the patient using  a wheelchair?: Yes Type of Wheelchair: Manual    Wheelchair assist level: Supervision/Verbal cueing Max wheelchair distance: 50 ft    Wheelchair 50 feet with 2 turns activity    Assist        Assist Level: Supervision/Verbal cueing   Wheelchair 150 feet activity     Assist      Assist Level: Moderate Assistance - Patient 50 - 74%   Blood pressure 113/64, pulse (!) 55, temperature 98.9 F (37.2 C), resp. rate 16, height 5\' 8"  (1.727 m), weight 59.4 kg, SpO2 100%.  Medical Problem List and Plan: 1. Functional deficits secondary to left basal ganglia hemorrhage 08/11/2023 d/t hypertension and anticoagulation  with subsequent right hemiparesis             -patient may  shower -ELOS/Goals: 09/05/23 days, goals mod I with PT and OT and independent with SLP -PRAFO while in bed to stretch right heel cord. I  suspect he will need an AFO for gait. -Continue CIR therapies including PT, OT  team conf in am  >2wks post ICH , Dr Roda Shutters , neuro rec repeat CT head - this was reviewed by Dr Roda Shutters as well as Pearlean [name].  Neuro recommends start Eliquis and neuro f/u as OP  2.  Antithrombotics: -DVT/anticoagulation:  Pharmaceutical: Heparin d/ced amb distance 125'             -antiplatelet therapy: none   3. Pain Management: Tylenol and robaxin as needed   4. Mood/Behavior/Sleep: LCSW to evaluate and provide emotional support             -antipsychotic agents: n/a    5. Neuropsych/cognition: This patient is capable of making decisions on his own behalf although he appears to have some short term memory deficits   6. Skin/Wound Care: Routine skin care checks   7. Fluids/Electrolytes/Nutrition: Routine Is and Os and follow-up chemistries   8: Hypertension/bradycardia: monitor TID and prn. Fair control at present             -Continue amlodipine 2.5 mg daily -Continue metoprolol tartrate  decreased to 12.5mg  BID on 08/20/23 Controlled 1/21 Vitals:   08/24/23 1923 08/24/23 2105 08/25/23 0610  08/25/23 1322  BP: 134/78 132/70 137/75 131/73   08/25/23 2011 08/26/23 0513 08/26/23 1310 08/26/23 1950  BP: 124/68 127/75 129/83 130/62   08/27/23 0433 08/27/23 1357 08/27/23 1930 08/28/23 0422  BP: 130/74 130/71 127/83 113/64    9: Hyperlipidemia: continue Lipitor 80 mg daily   10: Hypothyroidism: Continue Synthroid 125 mcg every morning   11: Tobacco use: Cessation counseling   12: Atrial fibrillation: Now on Eliquis 5mg  BID  Reached out to neurology to consider ASPIRE trial, may be done at OP visit. Continue amiodarone 200mg  daily  -rate is controlled   13: Chronic leukopenia: Follow-up CBC- stable WBCs    14: Mild thrombocytopenia: Follow-up CBC      LOS: 14 days A FACE TO FACE EVALUATION WAS PERFORMED  Erick Colace 08/28/2023, 7:34 AM

## 2023-08-29 NOTE — Plan of Care (Signed)
  Problem: Consults Goal: RH STROKE PATIENT EDUCATION Description: See Patient Education module for education specifics  Outcome: Progressing   Problem: RH BOWEL ELIMINATION Goal: RH STG MANAGE BOWEL WITH ASSISTANCE Description: STG Manage Bowel with mod I Assistance. Outcome: Progressing   Problem: RH SAFETY Goal: RH STG ADHERE TO SAFETY PRECAUTIONS W/ASSISTANCE/DEVICE Description: STG Adhere to Safety Precautions With cues Outcome: Progressing   Problem: RH KNOWLEDGE DEFICIT Goal: RH STG INCREASE KNOWLEDGE OF DIABETES Outcome: Progressing Goal: RH STG INCREASE KNOWLEDGE OF HYPERTENSION Description: Patient manage care re: hypertension at discharge using educational materials independently Outcome: Progressing Goal: RH STG INCREASE KNOWLEGDE OF HYPERLIPIDEMIA Outcome: Progressing Goal: RH STG INCREASE KNOWLEDGE OF STROKE PROPHYLAXIS Outcome: Progressing

## 2023-08-29 NOTE — Telephone Encounter (Signed)
I spoke with the pt but his magnet was out of reach. He was going to wait until he had some help then try to send a transmission.

## 2023-08-29 NOTE — Progress Notes (Signed)
PROGRESS NOTE   Subjective/Complaints:  No issues overnite , discussed potential to enroll in ASPIRE trial as OP  ROS: as per HPI. Denies CP, SOB, abd pain, N/V/D/C, or any other complaints at this time.    Objective:   CT HEAD WO CONTRAST ( ) Result Date: 08/27/2023 CLINICAL DATA:  Stroke, hemorrhagic EXAM: CT HEAD WITHOUT CONTRAST TECHNIQUE: Contiguous axial images were obtained from the base of the skull through the vertex without intravenous contrast. RADIATION DOSE REDUCTION: This exam was performed according to the departmental dose-optimization program which includes automated exposure control, adjustment of the mA and/or kV according to patient size and/or use of iterative reconstruction technique. COMPARISON:  Head CT 08/12/2023 FINDINGS: Brain: Interval decrease in size of the previously seen hemorrhage in the left basal ganglia, now measuring 10 x 7 mm, previously 20 x 12 mm. There is resolution of intraventricular blood products. No hydrocephalus. No extra-axial fluid collection. No mass effect. No mass lesion. No CT evidence of an acute cortical infarct. There is chronic left MCA territory infarct involving the left opercular region. Vascular: No hyperdense vessel or unexpected calcification. Skull: Normal. Negative for fracture or focal lesion. Sinuses/Orbits: No middle ear or mastoid effusion. Paranasal sinuses are notable for complete opacification of the right frontal sinus. Orbits are unremarkable. Other: None. IMPRESSION: No acute intracranial abnormality. Interval decrease in size of left basal ganglia hemorrhage, now measuring 10 x 7 mm, previously 20 x 12 mm. Resolution of intraventricular blood products. No hydrocephalus. Electronically Signed   By: Lorenza Cambridge M.D.   On: 08/27/2023 13:51    Recent Labs    08/27/23 0450  WBC 2.5*  HGB 14.4  HCT 39.9  PLT 185     Recent Labs    08/27/23 0450  NA 132*  K  4.2  CL 97*  CO2 25  GLUCOSE 97  BUN 20  CREATININE 0.99  CALCIUM 9.2      Intake/Output Summary (Last 24 hours) at 08/29/2023 0746 Last data filed at 08/29/2023 5784 Gross per 24 hour  Intake 948 ml  Output 975 ml  Net -27 ml        Physical Exam: Vital Signs Blood pressure 131/76, pulse (!) 52, temperature 97.6 F (36.4 C), resp. rate 17, height 5\' 8"  (1.727 m), weight 59.4 kg, SpO2 99%.   General: No acute distress Mood and affect are appropriate Heart: Regular rate and rhythm no rubs murmurs or extra sounds Lungs: Clear to auscultation, breathing unlabored, no rales or wheezes Abdomen: Positive bowel sounds, soft nontender to palpation, nondistended Extremities: No clubbing, cyanosis, or edema Skin: No evidence of breakdown, no evidence of rash Neurologic: Cranial nerves II through XII intact, Cerebellar exam normal finger to nose to finger as well as heel to shin in bilateral upper and lower extremities Musculoskeletal: Full range of motion in all 4 extremities. No joint swelling  Skin: No evidence of breakdown, no evidence of rash over exposed surfaces MsK: AFO on RLE-- not donned today   Neurologic: Cranial nerves--with right central VII, tongue deviation, motor strength is 5/5 in left and 4- R deltoid, bicep, tricep, grip,5/5 left and 3- right  hip flexor, knee extensors,  1/5 on right ADF/PF.  5/5 left ankle dorsiflexor and plantar flexor Sensory exam normal sensation to light touch  in bilateral upper and lower extremities Reduced pinprick RUE and RLE as well as left foot Proprioception  Reduced RUE with sensory ataxia  Intact finger to thumb opposition but slow on Right side - Musculoskeletal: Full range of motion in all 4 extremities. No joint swelling   Assessment/Plan: 1. Functional deficits which require 3+ hours per day of interdisciplinary therapy in a comprehensive inpatient rehab setting. Physiatrist is providing close team supervision and 24 hour  management of active medical problems listed below. Physiatrist and rehab team continue to assess barriers to discharge/monitor patient progress toward functional and medical goals  Care Tool:  Bathing    Body parts bathed by patient: Right arm, Chest, Abdomen, Front perineal area, Right upper leg, Buttocks, Left upper leg, Right lower leg, Left lower leg, Face, Left arm   Body parts bathed by helper: Left arm     Bathing assist Assist Level: Contact Guard/Touching assist     Upper Body Dressing/Undressing Upper body dressing   What is the patient wearing?: Pull over shirt    Upper body assist Assist Level: Supervision/Verbal cueing    Lower Body Dressing/Undressing Lower body dressing      What is the patient wearing?: Pants, Underwear/pull up     Lower body assist Assist for lower body dressing: Minimal Assistance - Patient > 75%     Toileting Toileting    Toileting assist Assist for toileting: Contact Guard/Touching assist     Transfers Chair/bed transfer  Transfers assist     Chair/bed transfer assist level: Minimal Assistance - Patient > 75%     Locomotion Ambulation   Ambulation assist      Assist level: Contact Guard/Touching assist Assistive device: Walker-rolling Max distance: 150 ft   Walk 10 feet activity   Assist     Assist level: Minimal Assistance - Patient > 75% Assistive device: No Device   Walk 50 feet activity   Assist Walk 50 feet with 2 turns activity did not occur: Safety/medical concerns (fatigue)  Assist level: Contact Guard/Touching assist Assistive device: Walker-rolling    Walk 150 feet activity   Assist Walk 150 feet activity did not occur: Safety/medical concerns (fatigue)  Assist level: Minimal Assistance - Patient > 75% Assistive device: Walker-rolling    Walk 10 feet on uneven surface  activity   Assist Walk 10 feet on uneven surfaces activity did not occur: Safety/medical concerns          Wheelchair     Assist Is the patient using a wheelchair?: Yes Type of Wheelchair: Manual    Wheelchair assist level: Supervision/Verbal cueing Max wheelchair distance: 50 ft    Wheelchair 50 feet with 2 turns activity    Assist        Assist Level: Supervision/Verbal cueing   Wheelchair 150 feet activity     Assist      Assist Level: Moderate Assistance - Patient 50 - 74%   Blood pressure 131/76, pulse (!) 52, temperature 97.6 F (36.4 C), resp. rate 17, height 5\' 8"  (1.727 m), weight 59.4 kg, SpO2 99%.  Medical Problem List and Plan: 1. Functional deficits secondary to left basal ganglia hemorrhage 08/11/2023 d/t hypertension and anticoagulation  with subsequent right hemiparesis             -patient may  shower -ELOS/Goals: 09/05/23 days, goals mod I with PT and OT and independent with SLP -  PRAFO while in bed to stretch right heel cord. I suspect he will need an AFO for gait. -Continue CIR therapies including PT, OT  Team conference today please see physician documentation under team conference tab, met with team  to discuss problems,progress, and goals. Formulized individual treatment plan based on medical history, underlying problem and comorbidities.  >2wks post ICH , Dr Roda Shutters , neuro rec repeat CT head - this was reviewed by Dr Roda Shutters as well as Pearlean [name].  Neuro recommends start Eliquis and neuro f/u as OP  2.  Antithrombotics: -DVT/anticoagulation:  Pharmaceutical: Heparin d/ced amb distance 125'             -antiplatelet therapy: none   3. Pain Management: Tylenol and robaxin as needed   4. Mood/Behavior/Sleep: LCSW to evaluate and provide emotional support             -antipsychotic agents: n/a    5. Neuropsych/cognition: This patient is capable of making decisions on his own behalf although he appears to have some short term memory deficits   6. Skin/Wound Care: Routine skin care checks   7. Fluids/Electrolytes/Nutrition: Routine Is and Os and follow-up  chemistries   8: Hypertension/bradycardia: monitor TID and prn. Fair control at present             -Continue amlodipine 2.5 mg daily -Continue metoprolol tartrate  decreased to 12.5mg  BID on 08/20/23 Controlled 1/22 Vitals:   08/25/23 1322 08/25/23 2011 08/26/23 0513 08/26/23 1310  BP: 131/73 124/68 127/75 129/83   08/26/23 1950 08/27/23 0433 08/27/23 1357 08/27/23 1930  BP: 130/62 130/74 130/71 127/83   08/28/23 0422 08/28/23 1359 08/28/23 1947 08/29/23 0606  BP: 113/64 (!) 160/87 123/69 131/76    9: Hyperlipidemia: continue Lipitor 80 mg daily   10: Hypothyroidism: Continue Synthroid 125 mcg every morning   11: Tobacco use: Cessation counseling   12: Atrial fibrillation: Now on Eliquis 5mg  BID  Reached out to neurology to consider ASPIRE trial, may be done at OP visit. Continue amiodarone 200mg  daily  -rate is controlled   13: Chronic leukopenia: Follow-up CBC- stable WBCs    14: Mild thrombocytopenia: Follow-up CBC   15.  Sensory def due to CVA. Left foot  sensation may be related to neuropathy , not a diabetic but has hx of ETOH  LOS: 15 days A FACE TO FACE EVALUATION WAS PERFORMED  Erick Colace 08/29/2023, 7:46 AM

## 2023-08-29 NOTE — Progress Notes (Signed)
Physical Therapy Session Note  Patient Details  Name: Adrian [name] MRN: [id number] Date of Birth: 1955-09-29  Today's Date: 08/29/2023 PT Individual Time: 1030-1200 PT Individual Time Calculation (min): 90 min   Short Term Goals: Week 1:  PT Short Term Goal 1 (Week 1): pt will perform sit to stand with LRAD and supervision PT Short Term Goal 1 - Progress (Week 1): Met PT Short Term Goal 2 (Week 1): pt will perform bed to chair transfer with LRAD and supervision PT Short Term Goal 2 - Progress (Week 1): Progressing toward goal PT Short Term Goal 3 (Week 1): pt will ambulate 50 feet with LRAD and min A PT Short Term Goal 3 - Progress (Week 1): Met Week 2:  PT Short Term Goal 1 (Week 2): Pt will perform stand pivot transfer with LRAD and supervision PT Short Term Goal 2 (Week 2): Pt will ambulate at least 100 feet using LRAD with light CGA. PT Short Term Goal 3 (Week 2): Pt will navigate at least 4 steps using BHR with overall minA. PT Short Term Goal 4 (Week 2): Pt will improve score on Berg Balance Test by at least the [facility]. PT Short Term Goal 5 (Week 2): Pt will improve quality of gait with ability to spend increased time on RLE in order to advance LLE past RLE consistently.  Skilled Therapeutic Interventions/Progress Updates:  Patient seated upright in w/c upon meeting pt at elevators with Recreation Therapist for outing to [facility] [employer] store. Patient alert, dressed warmly, and eager to participate in outing.    Patient with no pain complaint at start of session.  Pt has goals for entering/ exiting [employer] van via steps, entering/ exiting store, locating list of items in store including , ambulating using RW with safety awareness for fatigue and properly requesting to rest for energy conservation. Pt also has goal for using RUE to grasp smaller items in store, and BUE to grasp larger items.  Goals reviewed on trip to store and pt demos understanding.   Pt is able to complete all  goals with overall supervision. Since steps to [facility] are narrow, pt provided with guard to R knee to prevent LOB with knee hyperextension. Pt deemed to have been able to perform with close supervision as he is able to self correct throughout and has good grasp to hand/ safety rails in [place]. Please see goal sheet in shadow chart.   Recreation Therapist and PT provide education with pt re: improvement in overall management of feelings if pt can limit ideas of being confined d/t impairments from stroke. Continue to participate in daily life activities with supervision initially while progressing to full independence. Discussed potential scenario of need to use public restroom with supervision of opposite sex. Scenario described and problem solved with pt.   Patient supine in bed with HOB elevated at end of session with brakes locked, bed alarm set, and all needs within reach. Set up for lunch arrival.    Therapy Documentation Precautions:  Precautions Precautions: Fall Precaution Comments: R hemi, mild ataxia Restrictions Weight Bearing Restrictions Per Provider Order: No  Pain: No pain related this session.    Therapy/Group: Individual Therapy  Loel Dubonnet PT, DPT, CSRS 08/29/2023, 4:44 PM

## 2023-08-29 NOTE — Progress Notes (Signed)
Occupational Therapy Session Note  Patient Details  Name: Adrian [name] MRN: [id number] Date of Birth: 09-Sep-1955  Today's Date: 08/29/2023 OT Individual Time: 0932-1015+1317-1355 ( 38 mins) OT Individual Time Calculation (min): 43 min    Short Term Goals: Week 3:  OT Short Term Goal 1 (Week 3): STGS = LTGs  Skilled Therapeutic Interventions/Progress Updates:  Session 1: Pt handed off directly from PTA, pt agreeable to OT intervention.      Transfers/bed mobility/functional mobility: pt completed functional ambulation greater than a household distance with RW and CGA.   Therapeutic activity: pt completed various therapeutic activities focused on RUE coordination, standing balance/tolerance, and functional mobility:  - pt completed standing RUE FMC task with pt engaging in Adrian [name] game to challenge RUE motor planning and proprioception. Pt completed task with supervision for balance and MIN verbal cues for set- up and technique. Pt did compensate for impaired motor control by turning tower to retrieve pieces easier. -challenged RUE grip strength/coordination with pt using weighted clothespin in RUE to remove pegs from board with an emphasis on Motor control. Pt completed task with + and supervision.  -pt completed functional ambulation task with pt instructed to ambulate ~ 10 ft to match cards to board with RUE with an emphasis on RUE [facility]. Pt completed task with CGA. Graded task up and had pt step up on step and ambulate around cones to challenge balance and simulate tight spaces in home environment.    ADLs:  Grooming: pt completed standing grooming task of oral care at sink with supervision.   Transfers: ambulatory ADL transfer with RW and CGA- supervision  Toileting: continent b/b void with pt completing 3/3 toileting task with supervision    Ended session with pt supine in bed with all needs within reach and bed alarm activated.                    Session 2: Pt greeted supine in bed,  pt agreeable to OT intervention.    Pt requesting to shower.   Transfers/bed mobility/functional mobility: pt completed functional ambulation from bed<>bathroom with Rw and CGA.   ADLs: pt able to sit EOB and doff clothing with only supervision.  UB dressing:pt donned [place] shirt from EOB with set- up assist.  LB dressing: pt donned pants and underwear from EOB with set- up assist, good use of RW as needed for balance support  Footwear: pt donned socks with set- up assist vis figure 4  Bathing: pt completed bathing both seated/standing with supervision.  Transfers: ambulatory transfer into shower with grab bars and CGA. Pt completed additional ambulatory transfer to TTB in tub room with Rw and CGA, MIN verbal cues for technique and set-up. Pt does have grab bar in shower at home and demonstrated ability to sit>stand with grab bar with supervision. Education provided on tucking shower curtain under pts bottom to decrease excess water output, recommended adhesive shower head holder to position shower head in a more accessible position.   Ended session with pt supine in bed with all needs within reach and bed alarm activated.                    Therapy Documentation Precautions:  Precautions Precautions: Fall Precaution Comments: R hemi, mild ataxia Restrictions Weight Bearing Restrictions Per Provider Order: No  Pain: No pain reported during either session    Therapy/Group: Individual Therapy  [name] 08/29/2023, 12:28 PM

## 2023-08-29 NOTE — Progress Notes (Addendum)
Physical Therapy Session Note  Patient Details  Name: Adrian [name] MRN: [id number] Date of Birth: 02-08-1956  Today's Date: 08/29/2023 PT Individual Time: [phone] PT Individual Time Calculation (min): 23 min   Short Term Goals: Week 2:  PT Short Term Goal 1 (Week 2): Pt will perform stand pivot transfer with LRAD and supervision PT Short Term Goal 2 (Week 2): Pt will ambulate at least 100 feet using LRAD with light CGA. PT Short Term Goal 3 (Week 2): Pt will navigate at least 4 steps using BHR with overall minA. PT Short Term Goal 4 (Week 2): Pt will improve score on Berg Balance Test by at least the [facility]. PT Short Term Goal 5 (Week 2): Pt will improve quality of gait with ability to spend increased time on RLE in order to advance LLE past RLE consistently.  Skilled Therapeutic Interventions/Progress Updates: Patient sitting EOB on entrance to room. Patient alert and agreeable to PT session.   Patient reported no pain during session.   Therapeutic Activity: Transfers: Pt performed sit<>stand transfers throughout session with no AD and with light CGA for safety. Pt with improved step clearance during pivot this session  Gait Training:  Pt ambulated roughly 170' using no AD with CGA.  - Pt with improved internal rotation for neutral alignment during gait cycle on R LE this session vs previous sessions with increased external rotation - Pt provided with VC to control eccentric quadriceps during stance phase. Pt decrease step length on L LE after adhering to cue. Pt improved step length on L after VC to increase stance time on R - Pt with decreased R LE step clearance due to decreased L weight shift towards end of trial (VC provided with improved sequence)  Neuromuscular Re-ed: NMR facilitated during session with focus on improving neuromuscular connection of musculature listed below. - Contract-Relax of HS with PTA passively stretching to improve synergistic quality of R LE musculature  during gait cycle (pt with improved ROM tolerance of R knee extension from previous session after stretch - observational measure) - Contract-Contract of R quadriceps and HS with pt performing x 10 LAQ prior, and x 10 following intervention  NMR performed for improvements in motor control and coordination, balance, sequencing, judgement, and self confidence/ efficacy in performing all aspects of mobility at highest level of independence.   Patient handed off to OT session at end in day room gym.      Therapy Documentation Precautions:  Precautions Precautions: Fall Precaution Comments: R hemi, mild ataxia Restrictions Weight Bearing Restrictions Per Provider Order: No  Therapy/Group: Individual Therapy  Collyn Selk PTA 08/29/2023, 9:55 AM

## 2023-08-29 NOTE — Patient Care Conference (Cosign Needed)
Inpatient RehabilitationTeam Conference and Plan of Care Update Date: 08/29/2023   Time: 7:10 AM    Patient Name: Adrian [name]      Medical Record Number: [id number]  Date of Birth: 1955-11-12 Sex: Male         Room/Bed: 4W25C/4W25C-01 Payor Info: Payor: BLUE CROSS BLUE SHIELD / Plan: BCBS COMM PPO / Product Type: *No Product type* /    Admit Date/Time:  08/14/2023  4:52 PM  Primary Diagnosis:  ICH (intracerebral hemorrhage) [facility])  Hospital Problems: Principal Problem:   ICH (intracerebral hemorrhage) (HCC)    Expected Discharge Date: Expected Discharge Date: 09/05/23  Team Members Present:       Current Status/Progress Goal Weekly Team Focus  Bowel/Bladder   patient is contient of bowel and bladder   remain contient   Remain on current plan    Swallow/Nutrition/ Hydration   regular diet   Keep current regimen  Encourage patient to continue to do things independly    ADL's   patient is a moderate assist   Encourage patient to assist with care   Alllow patient to help with adls    Mobility   1 assist with a walker   continue to work with PT  Increase mobilty with PT    Communication   Effective communicator   Encourage patient to drive care   Allow patient to be are of care    Safety/Cognition/ Behavioral Observations  patient is mental intact   Ask thought driven questions   encourage patient to state what his plans are    Pain   no pain   continue current regimen   n/A    Skin   Skin is intact   keep current regimen  n/a      Discharge Planning:  meet goals   Team Discussion:  Patient on target to meet rehab goals:   *See Care Plan and progress notes for long and short-term goals.   Revisions to Treatment Plan:    Teaching Needs:   Current Barriers to Discharge:   Possible Resolutions to Barriers:      Medical Summary    Barriers to Discharge: Uncontrolled Hypertension;Uncontrolled Pain  Barriers to Discharge Comments:  uncontrolled Hypertension         I attest that I was present, lead the team conference, and concur with the assessment and plan of the team.   Pamala Duffel 08/29/2023, 7:10 AM

## 2023-08-30 ENCOUNTER — Ambulatory Visit (INDEPENDENT_AMBULATORY_CARE_PROVIDER_SITE_OTHER): Payer: BC Managed Care – PPO

## 2023-08-30 DIAGNOSIS — I428 Other cardiomyopathies: Secondary | ICD-10-CM | POA: Diagnosis not present

## 2023-08-30 NOTE — Progress Notes (Signed)
PROGRESS NOTE   Subjective/Complaints:  No issues overnite , discussed potential to enroll in ASPIRE trial as OP  ROS: as per HPI. Denies CP, SOB, abd pain, N/V/D/C, or any other complaints at this time.    Objective:   No results found.   No results for input(s): "WBC", "HGB", "HCT", "PLT" in the last 72 hours.    No results for input(s): "NA", "K", "CL", "CO2", "GLUCOSE", "BUN", "CREATININE", "CALCIUM" in the last 72 hours.     Intake/Output Summary (Last 24 hours) at 08/30/2023 0735 Last data filed at 08/29/2023 2006 Gross per 24 hour  Intake 952 ml  Output 775 ml  Net 177 ml        Physical Exam: Vital Signs Blood pressure (!) 158/83, pulse (!) 58, temperature 98 F (36.7 C), temperature source Oral, resp. rate 17, height 5\' 8"  (1.727 m), weight 59.4 kg, SpO2 100%.   General: No acute distress Mood and affect are appropriate Heart: Regular rate and rhythm no rubs murmurs or extra sounds Lungs: Clear to auscultation, breathing unlabored, no rales or wheezes Abdomen: Positive bowel sounds, soft nontender to palpation, nondistended Extremities: No clubbing, cyanosis, or edema Skin: No evidence of breakdown, no evidence of rash Neurologic: Cranial nerves II through XII intact, Cerebellar exam normal finger to nose to finger as well as heel to shin in bilateral upper and lower extremities Musculoskeletal: Full range of motion in all 4 extremities. No joint swelling  Skin: No evidence of breakdown, no evidence of rash over exposed surfaces MsK: AFO on RLE-- not donned today   Neurologic: Cranial nerves--with right central VII, tongue deviation, motor strength is 5/5 in left and 4- R deltoid, bicep, tricep, grip,5/5 left and 3- right  hip flexor, knee extensors,  1/5 on right ADF/PF.  5/5 left ankle dorsiflexor and plantar flexor Sensory exam normal sensation to light touch  in bilateral upper and lower  extremities Reduced pinprick RUE and RLE as well as left foot Proprioception  Reduced RUE with sensory ataxia  Intact finger to thumb opposition but slow on Right side - Musculoskeletal: Full range of motion in all 4 extremities. No joint swelling   Assessment/Plan: 1. Functional deficits which require 3+ hours per day of interdisciplinary therapy in a comprehensive inpatient rehab setting. Physiatrist is providing close team supervision and 24 hour management of active medical problems listed below. Physiatrist and rehab team continue to assess barriers to discharge/monitor patient progress toward functional and medical goals  Care Tool:  Bathing    Body parts bathed by patient: Right arm, Chest, Abdomen, Front perineal area, Right upper leg, Buttocks, Left upper leg, Right lower leg, Left lower leg, Face, Left arm   Body parts bathed by helper: Left arm     Bathing assist Assist Level: Contact Guard/Touching assist     Upper Body Dressing/Undressing Upper body dressing   What is the patient wearing?: Pull over shirt    Upper body assist Assist Level: Supervision/Verbal cueing    Lower Body Dressing/Undressing Lower body dressing      What is the patient wearing?: Pants, Underwear/pull up     Lower body assist Assist for lower body dressing: Minimal  Assistance - Patient > 75%     Editor, commissioning assist Assist for toileting: Contact Guard/Touching assist     Transfers Chair/bed transfer  Transfers assist     Chair/bed transfer assist level: Minimal Assistance - Patient > 75%     Locomotion Ambulation   Ambulation assist      Assist level: Contact Guard/Touching assist Assistive device: Walker-rolling Max distance: 150 ft   Walk 10 feet activity   Assist     Assist level: Minimal Assistance - Patient > 75% Assistive device: No Device   Walk 50 feet activity   Assist Walk 50 feet with 2 turns activity did not occur:  Safety/medical concerns (fatigue)  Assist level: Contact Guard/Touching assist Assistive device: Walker-rolling    Walk 150 feet activity   Assist Walk 150 feet activity did not occur: Safety/medical concerns (fatigue)  Assist level: Minimal Assistance - Patient > 75% Assistive device: Walker-rolling    Walk 10 feet on uneven surface  activity   Assist Walk 10 feet on uneven surfaces activity did not occur: Safety/medical concerns         Wheelchair     Assist Is the patient using a wheelchair?: Yes Type of Wheelchair: Manual    Wheelchair assist level: Supervision/Verbal cueing Max wheelchair distance: 50 ft    Wheelchair 50 feet with 2 turns activity    Assist        Assist Level: Supervision/Verbal cueing   Wheelchair 150 feet activity     Assist      Assist Level: Moderate Assistance - Patient 50 - 74%   Blood pressure (!) 158/83, pulse (!) 58, temperature 98 F (36.7 C), temperature source Oral, resp. rate 17, height 5\' 8"  (1.727 m), weight 59.4 kg, SpO2 100%.  Medical Problem List and Plan: 1. Functional deficits secondary to left basal ganglia hemorrhage 08/11/2023 d/t hypertension and anticoagulation  with subsequent right hemiparesis             -patient may  shower -ELOS/Goals: 09/05/23 days, goals mod I with PT and OT and independent with SLP -PRAFO while in bed to stretch right heel cord. I suspect he will need an AFO for gait. -Continue CIR therapies including PT, OT    >2wks post ICH , Dr Roda Shutters , neuro rec repeat CT head - this was reviewed by Dr Roda Shutters as well as Pearlean [name].  Neuro recommends start Eliquis and neuro f/u as OP  2.  Antithrombotics: -DVT/anticoagulation:  Pharmaceutical: Heparin d/ced amb distance >100'             -antiplatelet therapy: none   3. Pain Management: Tylenol and robaxin as needed   4. Mood/Behavior/Sleep: LCSW to evaluate and provide emotional support             -antipsychotic agents: n/a    5.  Neuropsych/cognition: This patient is capable of making decisions on his own behalf although he appears to have some short term memory deficits   6. Skin/Wound Care: Routine skin care checks   7. Fluids/Electrolytes/Nutrition: Routine Is and Os and follow-up chemistries   8: Hypertension/bradycardia: monitor TID and prn. Fair control at present             -Continue amlodipine 2.5 mg daily -Continue metoprolol tartrate  decreased to 12.5mg  BID on 08/20/23 Controlled 1/22- elevated this am on metoprolol , may need to increase amlodipine in am if this persists Vitals:   08/26/23 1310 08/26/23 1950 08/27/23 0433 08/27/23 1357  BP: 129/83 130/62 130/74 130/71   08/27/23 1930 08/28/23 0422 08/28/23 1359 08/28/23 1947  BP: 127/83 113/64 (!) 160/87 123/69   08/29/23 0606 08/29/23 1255 08/29/23 2003 08/30/23 0518  BP: 131/76 128/70 115/61 (!) 158/83    9: Hyperlipidemia: continue Lipitor 80 mg daily   10: Hypothyroidism: Continue Synthroid 125 mcg every morning   11: Tobacco use: Cessation counseling   12: Atrial fibrillation: Now on Eliquis 5mg  BID  Reached out to neurology to consider ASPIRE trial, may be done at OP visit. Continue amiodarone 200mg  daily  -rate is controlled   13: Chronic leukopenia: Follow-up CBC- stable WBCs    14: Mild thrombocytopenia: Follow-up CBC   15.  Sensory def due to CVA. Left foot  sensation may be related to neuropathy , not a diabetic but has hx of ETOH  LOS: 16 days A FACE TO FACE EVALUATION WAS PERFORMED  Adrian [name] 08/30/2023, 7:35 AM

## 2023-08-30 NOTE — Progress Notes (Signed)
Occupational Therapy Session Note  Patient Details  Name: Adrian [name] MRN: [id number] Date of Birth: [birth year]  Today's Date: 08/30/2023 OT Individual Time: 4098-1191 OT Individual Time Calculation (min): 55 min    Short Term Goals: Week 1:  OT Short Term Goal 1 (Week 1): Pt will be able to pick up finger foods to feed self with min A OT Short Term Goal 1 - Progress (Week 1): Met OT Short Term Goal 2 (Week 1): Pt will perform sit to stands in ADL without UE support wtih contact guard demonstrating improved balance OT Short Term Goal 2 - Progress (Week 1): Met OT Short Term Goal 3 (Week 1): Pt will don LB clothing including footwear with min A OT Short Term Goal 3 - Progress (Week 1): Met OT Short Term Goal 4 (Week 1): pt will perform toileting with min A for balance OT Short Term Goal 4 - Progress (Week 1): Met Week 2:  OT Short Term Goal 1 (Week 2): pt will demonstrate improved balance to don pants with supervision only. OT Short Term Goal 1 - Progress (Week 2): Met OT Short Term Goal 2 (Week 2): Pt will demonstrate improved R hand FMC to cut meat without A. OT Short Term Goal 2 - Progress (Week 2): Met OT Short Term Goal 3 (Week 2): Pt will demonstrate improved R shoulder AROM to be able to don a shirt without needing to use hemidressing techniques. OT Short Term Goal 3 - Progress (Week 2): Met Week 3:  OT Short Term Goal 1 (Week 3): STGS = LTGs  Skilled Therapeutic Interventions/Progress Updates:    Pt seen this session to focus on RUE NMR and balance.   Pt used RW to ambulate to gym with CGA In gym pt worked on seated hip abduction ball squeezes,  seated ankle internal rotation on ball,  hamstring isometric curls pushing ball with lower leg,  B hand squeezes on ball with reaching arms forward. For R hand manipulation, held soccer ball in R palm and rotated arm into external rotation and back.  Rotating hand up and down.    Standing holding [place] with 5 lbs of water,  pt  lifting arms up into bicep curls and then lowering to a modified dead lifts.     Pt had excellent  participation.  Ambulated back to room.  Pt resting in w/c with all needs met. Alarm set and call light in reach.    Therapy Documentation Precautions:  Precautions Precautions: Fall Precaution Comments: R hemi, mild ataxia Restrictions Weight Bearing Restrictions Per Provider Order: No   Vital Signs: Therapy Vitals Temp: 98 F (36.7 C) Temp Source: Oral Pulse Rate: 64 Resp: 17 BP: 135/76 Patient Position (if appropriate): Sitting Oxygen Therapy SpO2: 99 % O2 Device: Room Air Pain: Pain Assessment Pain Scale: 0-10 Pain Score: 0-No pain ADL: ADL Eating: Set up Grooming: Setup Where Assessed-Grooming: Sitting at sink Upper Body Bathing: Supervision/safety Where Assessed-Upper Body Bathing: Shower Lower Body Bathing: Supervision/safety Where Assessed-Lower Body Bathing: Shower Upper Body Dressing: Supervision/safety Where Assessed-Upper Body Dressing: Chair Lower Body Dressing: Supervision/safety Where Assessed-Lower Body Dressing: Chair Toileting: Supervision/safety Where Assessed-Toileting: Teacher, adult education: Furniture conservator/restorer Method: Biomedical scientist Method: Ambulating   Therapy/Group: Individual Therapy  Adrian [name] 08/30/2023, 8:30 AM

## 2023-08-30 NOTE — Progress Notes (Signed)
Physical Therapy Weekly Progress Note  Patient Details  Name: Adrian [name] MRN: [id number] Date of Birth: Apr 24, 1956  Beginning of progress report period: August 21, 2023 End of progress report period: August 30, 2023  Patient has met 5 of 5 short term goals. Pt has made significant progress towards LTGs. Pt has improved BERG balance score from 13/56 to 33/56 demonstrating significant improvement in mobility and balance. Pt has also significantly improved transfer safety with added use of R AFO as to avoid rolling R ankle, and assist with decreased DF AROM. Of note, pt has shown increase in DF activation and can transfer without AFO with improved foot drop. He has increased stance time on R LE by demonstrating consistent increase in step length on L (VC still required at times, but pt does well with error based learning and hinted cues) with use of RW. Stair navigation has been initiated with pt performing with CGA with decreased coordination on R LE when descending to maintain wide BOS  and demonstrating uncontrolled R knee hyperextension with stance phase on RLE to ascend. Only uses R hand rail as per household setup. He continues to have decreased R hip internal rotation as LE presents with excessive Er that requires increased cuing to maintain during swing phase of the gait cycle.  Patient continues to demonstrate the following deficits muscle weakness, decreased cardiorespiratoy endurance, impaired timing and sequencing and unbalanced muscle activation, decreased awareness, and decreased standing balance, decreased balance strategies, and hemipareisis  and therefore will continue to benefit from skilled PT intervention to increase functional independence with mobility.  Patient progressing toward long term goals..  Continue plan of care.  PT Short Term Goals Week 2:  PT Short Term Goal 1 (Week 2): Pt will perform stand pivot transfer with LRAD and supervision PT Short Term Goal 1 - Progress  (Week 2): Met PT Short Term Goal 2 (Week 2): Pt will ambulate at least 100 feet using LRAD with light CGA. PT Short Term Goal 2 - Progress (Week 2): Met PT Short Term Goal 3 (Week 2): Pt will navigate at least 4 steps using BHR with overall minA. PT Short Term Goal 3 - Progress (Week 2): Met PT Short Term Goal 4 (Week 2): Pt will improve score on Berg Balance Test by at least the [facility]. PT Short Term Goal 4 - Progress (Week 2): Met PT Short Term Goal 5 (Week 2): Pt will improve quality of gait with ability to spend increased time on RLE in order to advance LLE past RLE consistently. PT Short Term Goal 5 - Progress (Week 2): Met  Skilled Therapeutic Interventions/Progress Updates:  Ambulation/gait training;Discharge planning;Functional mobility training;Psychosocial support;Therapeutic Activities;Visual/perceptual remediation/compensation;Balance/vestibular training;Disease management/prevention;Neuromuscular re-education;Skin care/wound management;Therapeutic Exercise;Wheelchair propulsion/positioning;Cognitive remediation/compensation;DME/adaptive equipment instruction;Pain management;Splinting/orthotics;UE/LE Strength taining/ROM;Community reintegration;Functional electrical stimulation;Patient/family education;Stair training;UE/LE Coordination activities   Therapy Documentation Precautions:  Precautions Precautions: Fall Precaution Comments: R hemi, mild ataxia Restrictions Weight Bearing Restrictions Per Provider Order: No  Dominic Sandoval PTA 08/31/2023, 10:24 AM   Loel Dubonnet PT, DPT, CSRS 09/01/2023, 4:53 PM

## 2023-08-30 NOTE — Patient Care Conference (Signed)
Inpatient RehabilitationTeam Conference and Plan of Care Update Date: 08/29/2023   Time: 10:18 AM    Patient Name: Adrian [name]      Medical Record Number: [id number]  Date of Birth: 04/11/1956 Sex: Male         Room/Bed: 4W25C/4W25C-01 Payor Info: Payor: BLUE CROSS BLUE SHIELD / Plan: BCBS COMM PPO / Product Type: *No Product type* /    Admit Date/Time:  08/14/2023  4:52 PM  Primary Diagnosis:  ICH (intracerebral hemorrhage) [facility])  Hospital Problems: Principal Problem:   ICH (intracerebral hemorrhage) [facility])    Expected Discharge Date: Expected Discharge Date: 09/05/23  Team Members Present: Physician leading conference: Dr. Claudette Laws Social Worker Present: Cecile Sheerer, LCSWA Nurse Present: Chana Bode, RN;Angelina Shirlee Latch, RN;Ulla Mckiernan Marjo Bicker, RN PT Present: Ralph Leyden, PT OT Present: Primitivo Gauze, OT SLP Present: Pablo Lawrence, SLP PPS Coordinator present : Fae Pippin, SLP     Current Status/Progress Goal Weekly Team Focus  Bowel/Bladder   patient is contient of bowel and bladder   remain contient   Remain on current plan    Swallow/Nutrition/ Hydration   regular/ thin   Mod I  multiple swallows per bolus, upright position, and intermittent throat clear    ADL's   close supervision with self care and CGA to close S with ambulation with RW, RUE functional use improving well.    mod I with all self care, except for supervision with shower transfer    RUE NMR, balance, ADL training, pt education    Mobility   Bed mobility = Mod I, sit<>stand = supervision, stand pivot = CGA, ambulation = CGA/ supervision for balance, trialing AFO, improved coordination   Mod I for bed mobility, transfers, and w/c mobility, supervision for ambulation, MinA for stairs    Barriers: motor control/ strength on R hemibody, decreased confidence /// Work on: R sided NMR, standing balance, RLE/ UE motor control/ strength, improving quality of gait, family educ     Communication   90-100% intelligibility   supervision   carryover of speech intelligibility strategies    Safety/Cognition/ Behavioral Observations  sup to min A   supervision   problem solving, memory    Pain   no pain   continue current regimen   n/A    Skin   Skin is intact   keep current regimen  n/a      Discharge Planning:  Pt will discharge to home with ex-wife. Fam edu scheduled for Friday (1/24) 1pm-4pm. SW will confirm there are no barriers to discharge.   Team Discussion: ICH. Blood pressures okay with some bradycardia noted. Sensory deficits noted. Right UE function is progressing well with use of fine motor function. Gait progressing with use of AFO. Will need to be able to negotiate steps by discharge. Tolerating regular texture diet with multiple swallowing strategies.  Patient on target to meet rehab goals: yes, continues to progress to toward goals with discharge date of 09/05/2023  *See Care Plan and progress notes for long and short-term goals.   Revisions to Treatment Plan:  CT stable.  Start Eliquis.  Metoprolol discontinued. Reinforce no driving until cleared by MD.  AFO consult. Monitor labs and VS.  Teaching Needs: Medications, safety, self care, transfers, toileting, diet/lifestyle modifications, etc.   Current Barriers to Discharge: Decreased caregiver support and Home enviroment access/layout  Possible Resolutions to Barriers: Family education Confirm discharge location Order recommended DME     Medical Summary Current Status: afib rate controlled , RIght hemisensory  deficits due to CVA but also with Left foot sensory def, likely neuropathy  Barriers to Discharge: Medical stability;Self-care education;Uncontrolled Hypertension  Barriers to Discharge Comments: uncontrolled Hypertension Possible Resolutions to [employer] Focus: adjust BP meds,bradycardia, d/c BB   Continued Need for Acute Rehabilitation Level of Care: The patient  requires daily medical management by a physician with specialized training in physical medicine and rehabilitation for the following reasons: Direction of a multidisciplinary physical rehabilitation program to maximize functional independence : Yes Medical management of patient stability for increased activity during participation in an intensive rehabilitation regime.: Yes Analysis of laboratory values and/or radiology reports with any subsequent need for medication adjustment and/or medical intervention. : Yes   I attest that I was present, lead the team conference, and concur with the assessment and plan of the team.   Jearld Adjutant 08/30/2023, 11:24 AM

## 2023-08-30 NOTE — Progress Notes (Signed)
Physical Therapy Session Note  Patient Details  Name: Adrian [name] MRN: [id number] Date of Birth: 1955/08/14  Today's Date: 08/30/2023 PT Individual Time: 0908-1018 PT Individual Time Calculation (min): 70 min   Short Term Goals: Week 2:  PT Short Term Goal 1 (Week 2): Pt will perform stand pivot transfer with LRAD and supervision PT Short Term Goal 2 (Week 2): Pt will ambulate at least 100 feet using LRAD with light CGA. PT Short Term Goal 3 (Week 2): Pt will navigate at least 4 steps using BHR with overall minA. PT Short Term Goal 4 (Week 2): Pt will improve score on Berg Balance Test by at least the [facility]. PT Short Term Goal 5 (Week 2): Pt will improve quality of gait with ability to spend increased time on RLE in order to advance LLE past RLE consistently.  Skilled Therapeutic Interventions/Progress Updates: Patient sitting EOB on entrance to room. Patient alert and agreeable to PT session.   Patient reported no pain during session  Therapeutic Activity: Bed Mobility: Pt performed supine<sit on EOB with modI (HOB elevated). Transfers: Pt performed sit<>stand transfers throughout session with RW and with supervision for safety. VC for hand placement  Patient demonstrates increased fall risk as noted by score of  33/56 on Berg Balance Scale.  (<36= high risk for falls, close to 100%; 37-45 significant >80%; 46-51 moderate >50%; 52-55 lower >25%)  Gait Training:  Pt ambulated from room<main gym (150'+) using RW with light CGA. Pt with VC to control eccentric quadriceps in stance phase (min cueing). Pt also with longer AFO on R LE with increase in external rotation that required mod cues to increase internal rotation for neutral alignment  Pt ambulated roughly 19' with R HHA (minA) and VC to increase stance time on R LE (min cues) and to increase step length bilaterally (min cueing to avoid taking longer than necessary steps). Pt with VC to increase weight shift to L in order to increase  step clearance of R LE when pivoting to sit to edge of hi/low mat.   - Ascending 4 (6") steps with use of R HR, and with CGA. Pt with step-to pattern and was able to recall which LE to lead with (L), but unable to verbalize rationale (education provided). Pt required VC to increase R hip flexion to avoid circumduction compensation. - Descending 4 (6") steps with use of L HR, and with CGA. Pt with step-to pattern and was able to recall which LE to lead with (R), but unable to verbalize rationale (education provided). Pt required VC to increase R hip abductors to laterally step out as to avoid narrow BOS presentation (harder to maintain longer than 1-2 steps each round)  4 rounds performed with one break provided.  Neuromuscular Re-ed: NMR facilitated during session with focus on proprioceptive feedback on R LE. - R LE on 6" step (maintaining) with instructions to step up, and back down with L LE. Pt with R UE support on railing (forearm), then only hand (CGA required throughout) then 2 attempts without UE support (light minA to ascend, and heavy minA to control descending L LE).   NMR performed for improvements in motor control and coordination, balance, sequencing, judgement, and self confidence/ efficacy in performing all aspects of mobility at highest level of independence.   Patient supine in bed at end of session with brakes locked, bed alarm set, and all needs within reach.      Therapy Documentation Precautions:  Precautions Precautions: Fall Precaution  Comments: R hemi, mild ataxia Restrictions Weight Bearing Restrictions Per Provider Order: No  Therapy/Group: Individual Therapy  Patrick Sohm PTA 08/30/2023, 12:25 PM

## 2023-08-30 NOTE — Plan of Care (Signed)
  Problem: Consults Goal: RH STROKE PATIENT EDUCATION Description: See Patient Education module for education specifics  Outcome: Progressing   Problem: RH BOWEL ELIMINATION Goal: RH STG MANAGE BOWEL WITH ASSISTANCE Description: STG Manage Bowel with mod I Assistance. Outcome: Progressing   Problem: RH SAFETY Goal: RH STG ADHERE TO SAFETY PRECAUTIONS W/ASSISTANCE/DEVICE Description: STG Adhere to Safety Precautions With cues Outcome: Progressing   Problem: RH KNOWLEDGE DEFICIT Goal: RH STG INCREASE KNOWLEDGE OF DIABETES Outcome: Progressing Goal: RH STG INCREASE KNOWLEDGE OF HYPERTENSION Description: Patient manage care re: hypertension at discharge using educational materials independently Outcome: Progressing Goal: RH STG INCREASE KNOWLEGDE OF HYPERLIPIDEMIA Outcome: Progressing Goal: RH STG INCREASE KNOWLEDGE OF STROKE PROPHYLAXIS Outcome: Progressing

## 2023-08-30 NOTE — Progress Notes (Signed)
Occupational Therapy Session Note  Patient Details  Name: Adrian [name] MRN: [id number] Date of Birth: 1955-09-27  Today's Date: 08/30/2023 OT Individual Time: 1354-1459 OT Individual Time Calculation (min): 65 min    Short Term Goals: Week 3:  OT Short Term Goal 1 (Week 3): STGS = LTGs  Skilled Therapeutic Interventions/Progress Updates:  Pt greeted seated in w/c, pt agreeable to OT intervention.      Transfers/bed mobility/functional mobility: pt completed all functional ambulation with RW and supervision.   ADLs:   Footwear: practiced donned shoes with R AFO, pt required MIN A to position AFO correctly.   Transfers: pt completed ambulatory transfer into bathroom with RW and supervision.  Toileting: pt completed 3/3 toileting tasks with supervision    IADLS:pt completed functional reaching in kitchen with RW into [place] drawers and below knee level with RUE, pt completed task with close supervision but no LOB. Pt also able to place pans in oven with unilateral support and supervision. Education provided on fall prevention in kitchen and safety awareness with RW, did distribute walker bag to pt to allow for safe transitions of IADL items.   Pt additionally able to complete ambulatory transfers in apt to flat [facility] and recliner with RW and supervision.   Additionally talked through various IADL tasks such as feeding the dogs, laundry, cleaning and med mgmt. Pt with good insight into deficits and able to problem solve through various tasks with pt open to feedback and suggestions to complete task such as using reusable bag on RW to transport laundry.   NMR: pt completed various NMR tasks to challenge RUE motor planning:  -sit>stands from mat table while holding unweighted dowel at chest level, graded task up and balance bean bag on dowel rod to challenge RUE proprioception. Pt completed stands with supervision, x5 reps -sit>stands from EOM with pt holding tray and balancing cup on tray,  supervision for x5 sit>stands - pt able to stand at [facility] to use forearm gym to challenge RUE visual perception and RUE coordination  - pt able to stand at [facility] to maneuver small cube along edges of box to challenge visual perception skills and improve RUE motor planning.  - pt completed RUE NMR task with pt rolling large therapy ball forward/backwards with an emphasis on scapular protraction/retraction, graded task up and had pt step over obstacles in // bars while rolling ball to challenge dynamic balance and motor planning.   Ended session with pt supine in bed with all needs within reach and bed alarm activated.                    Therapy Documentation Precautions:  Precautions Precautions: Fall Precaution Comments: R hemi, mild ataxia Restrictions Weight Bearing Restrictions Per Provider Order: No  Pain: No pain reported during session    Therapy/Group: Individual Therapy  [name] 08/30/2023, 3:08 PM

## 2023-08-31 LAB — CUP PACEART REMOTE DEVICE CHECK
Date Time Interrogation Session: 20250123111800
Implantable Pulse Generator Implant Date: 20241112
Pulse Gen Serial Number: 126890

## 2023-08-31 NOTE — Progress Notes (Signed)
Speech Language Pathology Weekly Progress and Session Note  Patient Details  Name: Adrian [name] MRN: [id number] Date of Birth: 02-06-1956  Beginning of progress report period: August 22, 2023 End of progress report period: August 31, 2023  Today's Date: 08/31/2023 SLP Individual Time: 1000-1055 SLP Individual Time Calculation (min): 55 min  Short Term Goals: Week 2: SLP Short Term Goal 1 (Week 2): Patient will increase speech intelligibility to 100% during conversation with use of strategies given supervision multimodal A SLP Short Term Goal 1 - Progress (Week 2): Met SLP Short Term Goal 2 (Week 2): Patient will utilize compensatory strategies to increase word finding during abstract thoughts given supervision multimodal A SLP Short Term Goal 2 - Progress (Week 2): Progressing toward goal SLP Short Term Goal 3 (Week 2): Patient will demonstrate problem solving during mildly complex functional situations given supervision multimodal A SLP Short Term Goal 3 - Progress (Week 2): Met SLP Short Term Goal 4 (Week 2): Patient will recall and utilize memory compensatory aids given supervision multimodal A    New Short Term Goals: Week 3: SLP Short Term Goal 1 (Week 3): STGs=LTGs d/t ELOS  Weekly Progress Updates:  Patient has made steady gains and has met 2 of 4 STGs this reporting period. Patient is currently completing cognitive tasks with overall min A intermittent cues in regards to higher level problem solving situations. Patient is currently consuming a regular diet with thin liquids with supervision assistance to implement swallowing compensatory strategies. Patient and family education is ongoing. Patient would benefit from continued skilled SLP intervention to maximize cognitive-linguistic and swallow functioning and overall functional independence prior to discharge.      Intensity: Minumum of 1-2 x/day, 30 to 90 minutes Frequency: 3 to 5 out of 7 days Duration/Length of Stay:  1/29 Treatment/Interventions: Cognitive remediation/compensation;Dysphagia/aspiration precaution training;Cueing hierarchy;Functional tasks;Internal/external aids;Multimodal communication approach;Patient/family education;Speech/Language facilitation;Therapeutic Activities;Therapeutic Exercise   Daily Session  Skilled Therapeutic Interventions: Pt seen for SLP session to address cognitive goals. Pt able to independently recall all compensatory swallow strategies and speech tasks from prior sessions. He completed safety awareness task x2 with >90% accuracy given set up. He completed novel card game with min cues for strategic problem solving. He demonstrated carryover of rules as game progressed. Pt left sitting upright in bed with bed alarm activated and call bell in reach. Continue SLP PoC.      General    Pain Pain Assessment Pain Scale: 0-10 Pain Score: 0-No pain  Therapy/Group: Individual Therapy  Ellery Plunk 08/31/2023, 1:52 PM

## 2023-08-31 NOTE — Progress Notes (Signed)
Occupational Therapy Session Note  Patient Details  Name: Adrian [name] MRN: [id number] Date of Birth: 11/21/1955  Today's Date: 08/31/2023 OT Individual Time: 1914-7829 OT Individual Time Calculation (min): 42 min    Short Term Goals: Week 3:  OT Short Term Goal 1 (Week 3): STGS = LTGs  Skilled Therapeutic Interventions/Progress Updates:  Pt greeted supine in bed with ex wife present for family ed, pt agreeable to OT intervention.      Transfers/bed mobility/functional mobility: pt completed supine>sit MODI with use of bed features. Pt completed sit>stand from EOB with RW MODI. Pt completed functional ambulation greater than a household distance with Rw MODI.   ADLs:  Grooming: pt stood at sink to wash hands MODI   Footwear: pt able to don shoes and AFO today with only set- up assist! Pt even able to lace up shoe laces.    Transfers: ambulatory transfer into bathroom with RW and CGA Toileting: pt completed 3/3 toileting tasks with supervision, continent urine void.   Education:  family education provided to [name] ( ex wife) on the below topics: -always using gait belt for functional mobility -recommendation on use of RW for functional ambulation - general assist needed for ADLS and functional mobility ( I.e MODI - supervision) -general education provided on RUE impaired motor planning -recommendation of wearing supportive shoes and AFO during all functional mobility  -decreasing clutter/fall hazards in the home, shower safety -recommendation of using suction cup shower head holder to allow pt at access shower head easier from sitting position - discussed DME needs I.e TTb and Rw - discussed differences in HH vs OP, family prefers HH at first with hopes to transition to OP in near future -education provided on shower transfers to TTB, pt demonstrated ambulatory transfer to TTB with Rw with MODI - pt demo'ed functional ambulation greater than a household distance to wife as well  as transfer to recliner as pt primarily sit in recliner at home  Family demonstrated appropriate level of assist with ADLs and functional mobility    Ended session with pt supine in bed with all needs within reach.           Therapy Documentation Precautions:  Precautions Precautions: Fall Precaution Comments: R hemi, mild ataxia Restrictions Weight Bearing Restrictions Per Provider Order: No  Pain: No pain reported during session    Therapy/Group: Individual Therapy  [name] 08/31/2023, 2:59 PM

## 2023-08-31 NOTE — Progress Notes (Signed)
Occupational Therapy Session Note  Patient Details  Name: DAIRE OKIMOTO MRN: [id number] Date of Birth: 06-29-1956  Today's Date: 08/31/2023 OT Individual Time: 0835-0920 OT Individual Time Calculation (min): 45 min    Short Term Goals: Week 3:  OT Short Term Goal 1 (Week 3): STGS = LTGs  Skilled Therapeutic Interventions/Progress Updates:    Pt sat to EOB independently, managing bed functions to lower bed to flat position.  Pt used RW to ambulate to dresser to retrieve clothing and place in walker bag with distant Supervision.  Ambulated to toilet and then toileted, transferred to shower, showered, dried off all with mod independence.  Pt actively using RUE at a diminished level. Pt then ambulated to bed to dress. Donned all clothing independently.  Replaced elastic shoe laces with regular laces as pt is now able to tie his shoes.  He continues to struggle with donning shoe with AFO partly due to style of the shoe,  pt has a soft running shoe and would benefit from a stiffer walking shoe or hiking style sneaker.   Min A to don AFO.   Also worked on smooth sit to stand transitions. Pt has been practicing sit to stands without use of his hands but he tends to go into too much trunk extension and then wobbles until he gains his balance. Discussed with pt that although he is strong enough to stand with his legs only I want him to use his hands as a guide to ensure his sit to stand and stand to sit is smooth and balance. Pt then practiced this 8x demonstrating great control.    Pt resting in bed with all needs met. call light in reach.    Therapy Documentation Precautions:  Precautions Precautions: Fall Precaution Comments: R hemi, mild ataxia Restrictions Weight Bearing Restrictions Per Provider Order: No       Pain: Pain Assessment Pain Score: 0-No pain ADL: ADL Eating: Independent Grooming: Supervision/safety Where Assessed-Grooming: Standing at sink Upper Body Bathing:  Independent Where Assessed-Upper Body Bathing: Shower Lower Body Bathing: Modified independent Where Assessed-Lower Body Bathing: Shower Upper Body Dressing: Independent Where Assessed-Upper Body Dressing: Chair Lower Body Dressing: Supervision/safety (mod I with underwear and pants, min A with R AFO) Where Assessed-Lower Body Dressing: Chair Toileting: Modified independent Where Assessed-Toileting: Teacher, adult education: Distant supervision Statistician Method: Event organiser: Distant Restaurant manager, fast food Method: Ambulating     Therapy/Group: Individual Therapy  Yousuf Ager 08/31/2023, 10:00 AM

## 2023-08-31 NOTE — Plan of Care (Signed)
  Problem: RH Light Housekeeping Goal: LTG Patient will perform light housekeeping w/assist (OT) Description: LTG: Patient will perform light housekeeping with assistance, with/without cues (OT). Flowsheets (Taken 08/31/2023 1054) LTG: Pt will perform light housekeeping with assistance level of: Supervision/Verbal cueing LTG: Pt will perform light housekeeping w/level of: Ambulate with device   Problem: RH Laundry Goal: LTG Patient will perform laundry w/assist, cues (OT) Description: LTG: Patient will perform laundry with assistance, with/without cues (OT). Flowsheets (Taken 08/31/2023 1054) LTG: Pt will perform laundry with assistance level of: Supervision/Verbal cueing LTG: Pt will perform laundry with level of: Ambulate with device

## 2023-08-31 NOTE — Progress Notes (Signed)
Physical Therapy Session Note  Patient Details  Name: Adrian [name] MRN: [id number] Date of Birth: 02/24/1956  Today's Date: 08/31/2023 PT Individual Time: 4098-1191 PT Individual Time Calculation (min): 60 min   Short Term Goals: Week 1:  PT Short Term Goal 1 (Week 1): pt will perform sit to stand with LRAD and supervision PT Short Term Goal 1 - Progress (Week 1): Met PT Short Term Goal 2 (Week 1): pt will perform bed to chair transfer with LRAD and supervision PT Short Term Goal 2 - Progress (Week 1): Progressing toward goal PT Short Term Goal 3 (Week 1): pt will ambulate 50 feet with LRAD and min A PT Short Term Goal 3 - Progress (Week 1): Met Week 2:  PT Short Term Goal 1 (Week 2): Pt will perform stand pivot transfer with LRAD and supervision PT Short Term Goal 1 - Progress (Week 2): Met PT Short Term Goal 2 (Week 2): Pt will ambulate at least 100 feet using LRAD with light CGA. PT Short Term Goal 2 - Progress (Week 2): Met PT Short Term Goal 3 (Week 2): Pt will navigate at least 4 steps using BHR with overall minA. PT Short Term Goal 3 - Progress (Week 2): Met PT Short Term Goal 4 (Week 2): Pt will improve score on Berg Balance Test by at least the [facility]. PT Short Term Goal 4 - Progress (Week 2): Met PT Short Term Goal 5 (Week 2): Pt will improve quality of gait with ability to spend increased time on RLE in order to advance LLE past RLE consistently. PT Short Term Goal 5 - Progress (Week 2): Met Week 3:   STG = LTG d/t ELOS  Skilled Therapeutic Interventions/Progress Updates:  Patient seated upright on EOB on entrance to room. Ex-wife Adrian [name] present for family education. Patient alert and agreeable to PT session.   Patient with no pain complaint at start of session.  Therapeutic Activity: Bed Mobility: Pt performed supine <> sit with Mod I. No cueing required. Pt demonstrates ability to tie shoes with IND. Still needs CGA to don AFO.  Transfers: Pt performed sit<>stand and  stand pivot transfers throughout session with supervision/ Mod I. Provided vc/ tc fortaking time with pivot stepping. Adrian [name] is able to provide appropriate supervision.   Gait Training:  Pt ambulated community level distances throughout department  using RW with close supervision for distance. Demonstrated to wife appropriate technique to provide CGA with light hold to belt but no pulling/ pushing to pt. Provided vc/ tc initially to correct technique and to pt for direct hip flexion to raise knee.   Demonstrates car transfer with supervision and educated wife on role to hold door in case pt reaches out to use it despite education not to use door if pt is balanced. Also educated on her role to provide RW prior to pt's exit from vehicle.   Ambulated up/ down ramp with supervision and Adrian [name] providing CGA with vc.   Also demonstrated ability to ascend/ descend twelve 6-in steps using R HR. Demonstrated to Adrian [name] how to guard R knee from extreme hyperextension and potential subsequent posterior weight shift. Educated to remain one step below pt to ascend as well as to descend. Educated to remind pt to maintain feet apart during placement to descend. Wife cued to provide RW to pt at bottom of steps and educated to take RW to top of steps at home in order to have AD ready when pt reaches top of steps. She  is able to perform with decreasing vc during next 2 bouts of steps.   Answered all remaining questions from Adrian [name] and pt within therapist's scope of practice and deferred all remaining questions to CSW, nursing and medical staff.   Patient supine in bed at end of session with brakes locked, bed alarm set, and all needs within reach.  Therapy Documentation Precautions:  Precautions Precautions: Fall Precaution Comments: R hemi, mild ataxia Restrictions Weight Bearing Restrictions Per Provider Order: No  Pain:  No pain related this session.   Therapy/Group: Individual Therapy  Loel Dubonnet PT,  DPT, CSRS 08/31/2023, 6:17 PM

## 2023-08-31 NOTE — Progress Notes (Signed)
PROGRESS NOTE   Subjective/Complaints:  No issues overnite , Pt able to walk to gym  Has his own AFO Right side   ROS: as per HPI. Denies CP, SOB, abd pain, N/V/D/C, or any other complaints at this time.    Objective:   No results found.   No results for input(s): "WBC", "HGB", "HCT", "PLT" in the last 72 hours.    No results for input(s): "NA", "K", "CL", "CO2", "GLUCOSE", "BUN", "CREATININE", "CALCIUM" in the last 72 hours.     Intake/Output Summary (Last 24 hours) at 08/31/2023 0740 Last data filed at 08/30/2023 1802 Gross per 24 hour  Intake 944 ml  Output 350 ml  Net 594 ml        Physical Exam: Vital Signs Blood pressure 133/73, pulse (!) 58, temperature 97.7 F (36.5 C), temperature source Oral, resp. rate 16, height 5\' 8"  (1.727 m), weight 59.4 kg, SpO2 100%.   General: No acute distress Mood and affect are appropriate Heart: Regular rate and rhythm no rubs murmurs or extra sounds Lungs: Clear to auscultation, breathing unlabored, no rales or wheezes Abdomen: Positive bowel sounds, soft nontender to palpation, nondistended Extremities: No clubbing, cyanosis, or edema Skin: No evidence of breakdown, no evidence of rash Neurologic: Cranial nerves II through XII intact, Musculoskeletal: Full range of motion in all 4 extremities. No joint swelling Cerebellar- moderate dysmetria R FNF Skin: No evidence of breakdown, no evidence of rash over exposed surfaces MsK: AFO on RLE-- not donned today   Neurologic: Cranial nerves--with right central VII, tongue deviation, motor strength is 5/5 in left and 4- R deltoid, bicep, tricep, grip,5/5 left and 3- right  hip flexor, knee extensors,  1/5 on right ADF/PF.  5/5 left ankle dorsiflexor and plantar flexor Sensory exam normal sensation to light touch  in bilateral upper and lower extremities Reduced pinprick RUE and RLE as well as left foot Proprioception  Reduced  RUE with sensory ataxia  Intact finger to thumb opposition but slow on Right side - Musculoskeletal: Full range of motion in all 4 extremities. No joint swelling   Assessment/Plan: 1. Functional deficits which require 3+ hours per day of interdisciplinary therapy in a comprehensive inpatient rehab setting. Physiatrist is providing close team supervision and 24 hour management of active medical problems listed below. Physiatrist and rehab team continue to assess barriers to discharge/monitor patient progress toward functional and medical goals  Care Tool:  Bathing    Body parts bathed by patient: Right arm, Chest, Abdomen, Front perineal area, Right upper leg, Buttocks, Left upper leg, Right lower leg, Left lower leg, Face, Left arm   Body parts bathed by helper: Left arm     Bathing assist Assist Level: Contact Guard/Touching assist     Upper Body Dressing/Undressing Upper body dressing   What is the patient wearing?: Pull over shirt    Upper body assist Assist Level: Supervision/Verbal cueing    Lower Body Dressing/Undressing Lower body dressing      What is the patient wearing?: Pants, Underwear/pull up     Lower body assist Assist for lower body dressing: Minimal Assistance - Patient > 75%     Toileting Toileting  Toileting assist Assist for toileting: Contact Guard/Touching assist     Transfers Chair/bed transfer  Transfers assist     Chair/bed transfer assist level: Supervision/Verbal cueing     Locomotion Ambulation   Ambulation assist      Assist level: Contact Guard/Touching assist Assistive device: Walker-rolling Max distance: 150 ft   Walk 10 feet activity   Assist     Assist level: Supervision/Verbal cueing Assistive device: Walker-rolling   Walk 50 feet activity   Assist Walk 50 feet with 2 turns activity did not occur: Safety/medical concerns (fatigue)  Assist level: Supervision/Verbal cueing Assistive device:  Walker-rolling    Walk 150 feet activity   Assist Walk 150 feet activity did not occur: Safety/medical concerns (fatigue)  Assist level: Supervision/Verbal cueing Assistive device: Walker-rolling    Walk 10 feet on uneven surface  activity   Assist Walk 10 feet on uneven surfaces activity did not occur: Safety/medical concerns         Wheelchair     Assist Is the patient using a wheelchair?: Yes Type of Wheelchair: Manual    Wheelchair assist level: Supervision/Verbal cueing Max wheelchair distance: 50 ft    Wheelchair 50 feet with 2 turns activity    Assist        Assist Level: Supervision/Verbal cueing   Wheelchair 150 feet activity     Assist      Assist Level: Moderate Assistance - Patient 50 - 74%   Blood pressure 133/73, pulse (!) 58, temperature 97.7 F (36.5 C), temperature source Oral, resp. rate 16, height 5\' 8"  (1.727 m), weight 59.4 kg, SpO2 100%.  Medical Problem List and Plan: 1. Functional deficits secondary to left basal ganglia hemorrhage 08/11/2023 d/t hypertension and anticoagulation  with subsequent right hemiparesis             -patient may  shower -ELOS/Goals: 09/05/23 days, goals mod I with PT and OT and independent with SLP -PRAFO while in bed to stretch right heel cord. I suspect he will need an AFO for gait. -Continue CIR therapies including PT, OT    >2wks post ICH , Dr Roda Shutters , neuro rec repeat CT head - this was reviewed by Dr Roda Shutters as well as Pearlean [name].  Neuro recommends start Eliquis and neuro f/u as OP  2.  Antithrombotics: -DVT/anticoagulation:  Pharmaceutical: Heparin d/ced amb distance >100'             -antiplatelet therapy: none   3. Pain Management: Tylenol and robaxin as needed   4. Mood/Behavior/Sleep: LCSW to evaluate and provide emotional support             -antipsychotic agents: n/a    5. Neuropsych/cognition: This patient is capable of making decisions on his own behalf although he appears to have some short  term memory deficits   6. Skin/Wound Care: Routine skin care checks   7. Fluids/Electrolytes/Nutrition: Routine Is and Os and follow-up chemistries   8: Hypertension/bradycardia: monitor TID and prn. Fair control at present             -Continue amlodipine 2.5 mg daily -Continue metoprolol tartrate  decreased to 12.5mg  BID on 08/20/23 Controlled 1/22- elevated this am on metoprolol , may need to increase amlodipine in am if this persists Vitals:   08/27/23 1930 08/28/23 0422 08/28/23 1359 08/28/23 1947  BP: 127/83 113/64 (!) 160/87 123/69   08/29/23 0606 08/29/23 1255 08/29/23 2003 08/30/23 0518  BP: 131/76 128/70 115/61 (!) 158/83   08/30/23 0801 08/30/23 1314  08/30/23 1933 08/31/23 0514  BP: 135/76 136/72 120/68 133/73    9: Hyperlipidemia: continue Lipitor 80 mg daily   10: Hypothyroidism: Continue Synthroid 125 mcg every morning   11: Tobacco use: Cessation counseling   12: Atrial fibrillation: Now on Eliquis 5mg  BID  Reached out to neurology to consider ASPIRE trial, may be done at OP visit. Continue amiodarone 200mg  daily  -rate is controlled   13: Chronic leukopenia: Follow-up CBC- stable WBCs    14: Mild thrombocytopenia: Follow-up CBC   15.  Sensory def due to CVA. Left foot  sensation may be related to neuropathy , not a diabetic but has hx of ETOH  LOS: 17 days A FACE TO FACE EVALUATION WAS PERFORMED  Erick Colace 08/31/2023, 7:40 AM

## 2023-09-01 DIAGNOSIS — I10 Essential (primary) hypertension: Secondary | ICD-10-CM | POA: Diagnosis not present

## 2023-09-01 DIAGNOSIS — I610 Nontraumatic intracerebral hemorrhage in hemisphere, subcortical: Secondary | ICD-10-CM | POA: Diagnosis not present

## 2023-09-01 NOTE — Progress Notes (Signed)
PROGRESS NOTE   Subjective/Complaints:  Pt doing well, slept well, denies pain, LBM yesterday, urinating fine, denies any other complaints or concerns.   ROS: as per HPI. Denies CP, SOB, abd pain, N/V/D/C, or any other complaints at this time.    Objective:   No results found.    No results for input(s): "WBC", "HGB", "HCT", "PLT" in the last 72 hours.    No results for input(s): "NA", "K", "CL", "CO2", "GLUCOSE", "BUN", "CREATININE", "CALCIUM" in the last 72 hours.     Intake/Output Summary (Last 24 hours) at 09/01/2023 1236 Last data filed at 09/01/2023 0913 Gross per 24 hour  Intake 540 ml  Output 1100 ml  Net -560 ml        Physical Exam: Vital Signs Blood pressure 120/68, pulse 68, temperature 98.8 F (37.1 C), temperature source Oral, resp. rate 16, height 5\' 8"  (1.727 m), weight 59.4 kg, SpO2 99%.   General: No acute distress, resting in bed.  Mood and affect are appropriate Heart: Regular rate and rhythm no rubs murmurs or extra sounds Lungs: Clear to auscultation, breathing unlabored, no rales or wheezes Abdomen: Positive bowel sounds, soft nontender to palpation, nondistended Extremities: No clubbing, cyanosis, or edema Skin: No evidence of breakdown, no evidence of rash over exposed surfaces.   PRIOR EXAMS: Neurologic: Cranial nerves II through XII intact, Musculoskeletal: Full range of motion in all 4 extremities. No joint swelling Cerebellar- moderate dysmetria R FNF Skin: No evidence of breakdown, no evidence of rash over exposed surfaces MsK: AFO on RLE-- not donned today   Neurologic: Cranial nerves--with right central VII, tongue deviation, motor strength is 5/5 in left and 4- R deltoid, bicep, tricep, grip,5/5 left and 3- right  hip flexor, knee extensors,  1/5 on right ADF/PF.  5/5 left ankle dorsiflexor and plantar flexor Sensory exam normal sensation to light touch  in bilateral upper  and lower extremities Reduced pinprick RUE and RLE as well as left foot Proprioception  Reduced RUE with sensory ataxia  Intact finger to thumb opposition but slow on Right side - Musculoskeletal: Full range of motion in all 4 extremities. No joint swelling   Assessment/Plan: 1. Functional deficits which require 3+ hours per day of interdisciplinary therapy in a comprehensive inpatient rehab setting. Physiatrist is providing close team supervision and 24 hour management of active medical problems listed below. Physiatrist and rehab team continue to assess barriers to discharge/monitor patient progress toward functional and medical goals  Care Tool:  Bathing    Body parts bathed by patient: Right arm, Chest, Abdomen, Front perineal area, Right upper leg, Buttocks, Left upper leg, Right lower leg, Left lower leg, Face, Left arm   Body parts bathed by helper: Left arm     Bathing assist Assist Level: Independent with assistive device     Upper Body Dressing/Undressing Upper body dressing   What is the patient wearing?: Pull over shirt    Upper body assist Assist Level: Independent    Lower Body Dressing/Undressing Lower body dressing      What is the patient wearing?: Pants, Underwear/pull up     Lower body assist Assist for lower body dressing: Independent with  assitive device     Editor, commissioning assist Assist for toileting: Independent with assistive device     Transfers Chair/bed transfer  Transfers assist     Chair/bed transfer assist level: Supervision/Verbal cueing     Locomotion Ambulation   Ambulation assist      Assist level: Contact Guard/Touching assist Assistive device: Walker-rolling Max distance: 150 ft   Walk 10 feet activity   Assist     Assist level: Supervision/Verbal cueing Assistive device: Walker-rolling   Walk 50 feet activity   Assist Walk 50 feet with 2 turns activity did not occur: Safety/medical  concerns (fatigue)  Assist level: Supervision/Verbal cueing Assistive device: Walker-rolling    Walk 150 feet activity   Assist Walk 150 feet activity did not occur: Safety/medical concerns (fatigue)  Assist level: Supervision/Verbal cueing Assistive device: Walker-rolling    Walk 10 feet on uneven surface  activity   Assist Walk 10 feet on uneven surfaces activity did not occur: Safety/medical concerns         Wheelchair     Assist Is the patient using a wheelchair?: Yes Type of Wheelchair: Manual    Wheelchair assist level: Supervision/Verbal cueing Max wheelchair distance: 50 ft    Wheelchair 50 feet with 2 turns activity    Assist        Assist Level: Supervision/Verbal cueing   Wheelchair 150 feet activity     Assist      Assist Level: Moderate Assistance - Patient 50 - 74%   Blood pressure 120/68, pulse 68, temperature 98.8 F (37.1 C), temperature source Oral, resp. rate 16, height 5\' 8"  (1.727 m), weight 59.4 kg, SpO2 99%.  Medical Problem List and Plan: 1. Functional deficits secondary to left basal ganglia hemorrhage 08/11/2023 d/t hypertension and anticoagulation  with subsequent right hemiparesis             -patient may  shower -ELOS/Goals: 09/05/23 days, goals mod I with PT and OT and independent with SLP -PRAFO while in bed to stretch right heel cord. I suspect he will need an AFO for gait. -Continue CIR therapies including PT, OT   2.  Antithrombotics: -DVT/anticoagulation:  Pharmaceutical: Heparin d/ced amb distance >100'             -antiplatelet therapy: none >2wks post ICH , Dr Roda Shutters , neuro rec repeat CT head - this was reviewed by Dr Roda Shutters as well as Pearlean [name].  Neuro recommends start Eliquis and neuro f/u as OP 3. Pain Management: Tylenol and robaxin as needed   4. Mood/Behavior/Sleep: LCSW to evaluate and provide emotional support             -antipsychotic agents: n/a    5. Neuropsych/cognition: This patient is capable of  making decisions on his own behalf although he appears to have some short term memory deficits   6. Skin/Wound Care: Routine skin care checks   7. Fluids/Electrolytes/Nutrition: Routine Is and Os and follow-up chemistries   8: Hypertension/bradycardia: monitor TID and prn. Fair control at present             -Continue amlodipine 2.5 mg daily -Continue metoprolol tartrate  decreased to 12.5mg  BID on 08/20/23-- d/c'd Controlled 1/22- elevated this am on metoprolol , may need to increase amlodipine in am if this persists -09/01/23 BP fine, cont regimen Vitals:   08/28/23 1947 08/29/23 0606 08/29/23 1255 08/29/23 2003  BP: 123/69 131/76 128/70 115/61   08/30/23 0518 08/30/23 0801 08/30/23 1314 08/30/23 1933  BP: (!) 158/83 135/76 136/72 120/68   08/31/23 0514 08/31/23 1649 08/31/23 1926 09/01/23 0457  BP: 133/73 123/74 107/63 120/68    9: Hyperlipidemia: continue Lipitor 80 mg daily   10: Hypothyroidism: Continue Synthroid 125 mcg every morning   11: Tobacco use: Cessation counseling   12: Atrial fibrillation: Now on Eliquis 5mg  BID  Reached out to neurology to consider ASPIRE trial, may be done at OP visit. Continue amiodarone 200mg  daily  -rate is controlled   13: Chronic leukopenia: Follow-up CBC- stable WBCs    14: Mild thrombocytopenia: Follow-up CBC   15.  Sensory def due to CVA. Left foot  sensation may be related to neuropathy , not a diabetic but has hx of ETOH  LOS: 18 days A FACE TO FACE EVALUATION WAS PERFORMED  [address] 09/01/2023, 12:36 PM

## 2023-09-01 NOTE — Progress Notes (Signed)
Physical Therapy Session Note  Patient Details  Name: Adrian [name] MRN: [id number] Date of Birth: [birth year]  Today's Date: 09/01/2023 PT Individual Time: 0920-1002 PT Individual Time Calculation (min): 42 min   Short Term Goals: Week 2:  PT Short Term Goal 1 (Week 2): Pt will perform stand pivot transfer with LRAD and supervision PT Short Term Goal 1 - Progress (Week 2): Met PT Short Term Goal 2 (Week 2): Pt will ambulate at least 100 feet using LRAD with light CGA. PT Short Term Goal 2 - Progress (Week 2): Met PT Short Term Goal 3 (Week 2): Pt will navigate at least 4 steps using BHR with overall minA. PT Short Term Goal 3 - Progress (Week 2): Met PT Short Term Goal 4 (Week 2): Pt will improve score on Berg Balance Test by at least the [facility]. PT Short Term Goal 4 - Progress (Week 2): Met PT Short Term Goal 5 (Week 2): Pt will improve quality of gait with ability to spend increased time on RLE in order to advance LLE past RLE consistently. PT Short Term Goal 5 - Progress (Week 2): Met  Skilled Therapeutic Interventions/Progress Updates: Patient supine in bed on entrance to room. Patient alert and agreeable to PT session.   Patient reported no pain during session.  Therapeutic Activity: Bed Mobility: Pt performed supine<>sit on EOB with supervision/modI (HOB elevated).   Gait Training:  Pt ambulated from room<>day room gym using RW with close supervision. Pt with improved placement of R LE during gait cycle with closer to neutral BOS. Pt with R hyperextension of knee that improved after initial VC to increase eccentric control of quadriceps with "soft bend" VC. Pt also with closer to neutral alignment of R foot (not as externally rotated) that decreased as pt's cadence increased. Pt required VC to decrease cadence in order to take intentional steps to increase internal rotation for neutral alignment.   Neuromuscular Re-ed: NMR facilitated during session with focus on neuromuscular  control/coordination of R LE. - 2lb ankle weights tied on each end of yellow theraband. Band on dorsal aspect of R foot with pt short sitting on high elevation of hi/low mat. Pt with instructional cues to perform LAQ with heavy emphasis on 2-3 second isometric hold in extension and eccentric control. Pt performed 2 rounds until fatigue - Pt supine on hi/low mat performing 90/90 on R LE (AFO and shoe donned at first, then doffed in order for pt to obtain closer to 90* at R knee - pt performed until close to fatigue with rest break to follow (light min/minA to maintain R knee 90* with PTA also providing visual cue for pt to avoid hitting PTA arm on lateral side to avoid external rotation at R hip). On second round, pt demonstrated improved control 90* control of R knee with a few reps initially that required light minA.  Pt encouraged to perform 90/90 while supine in bed later in day and next day as pt does not have therapy with pt understanding focus on VC and sequence performed during session (2-3 sets both days with reps stops once pt starts to fatigue - when ability to control movement decreases).   NMR performed for improvements in motor control and coordination, balance, sequencing, judgement, and self confidence/ efficacy in performing all aspects of mobility at highest level of independence.   Patient supine in bed at end of session with brakes locked, bed alarm set, and all needs within reach.  Therapy Documentation Precautions:  Precautions Precautions: Fall Precaution Comments: R hemi, mild ataxia Restrictions Weight Bearing Restrictions Per Provider Order: No  Therapy/Group: Individual Therapy  Geraldina Parrott PTA 09/01/2023, 12:29 PM

## 2023-09-01 NOTE — Progress Notes (Signed)
Occupational Therapy Session Note  Patient Details  Name: Adrian [name] MRN: [id number] Date of Birth: November 01, 1955  Today's Date: 09/01/2023 OT Individual Time: 1308-6578 OT Individual Time Calculation (min): 29 min    Short Term Goals: Week 2:  OT Short Term Goal 1 (Week 2): pt will demonstrate improved balance to don pants with supervision only. OT Short Term Goal 1 - Progress (Week 2): Met OT Short Term Goal 2 (Week 2): Pt will demonstrate improved R hand FMC to cut meat without A. OT Short Term Goal 2 - Progress (Week 2): Met OT Short Term Goal 3 (Week 2): Pt will demonstrate improved R shoulder AROM to be able to don a shirt without needing to use hemidressing techniques. OT Short Term Goal 3 - Progress (Week 2): Met Week 3:  OT Short Term Goal 1 (Week 3): STGS = LTGs  Skilled Therapeutic Interventions/Progress Updates:    Pt bed level at time of session agreeable to OT and no pain. Pt donning pants with set up, shoes including AFO with supervision sitting EOB, donning pull over sweatshirt with set up as well. Pt using RW to maneuver in room to pick out clothing, get washcloth, and other personal items all with Supervision. Standing for grooming and hygiene tasks - good use of AD. Pt performing sit <> stands from low, moderate, and high surface all for 1x5 with Supervision from high surface and needing MIN A 1x from low surface 2/2 mild LOB. Pt motivated and pleasant. Alarm onc all bell in reach at end of session.   Therapy Documentation Precautions:  Precautions Precautions: Fall Precaution Comments: R hemi, mild ataxia Restrictions Weight Bearing Restrictions Per Provider Order: No     Therapy/Group: Individual Therapy  Erasmo Score 09/01/2023, 12:20 PM

## 2023-09-02 DIAGNOSIS — I610 Nontraumatic intracerebral hemorrhage in hemisphere, subcortical: Secondary | ICD-10-CM | POA: Diagnosis not present

## 2023-09-02 DIAGNOSIS — I10 Essential (primary) hypertension: Secondary | ICD-10-CM | POA: Diagnosis not present

## 2023-09-02 NOTE — Plan of Care (Signed)
  Problem: Consults Goal: RH STROKE PATIENT EDUCATION Description: See Patient Education module for education specifics  Outcome: Progressing   Problem: RH BOWEL ELIMINATION Goal: RH STG MANAGE BOWEL WITH ASSISTANCE Description: STG Manage Bowel with mod I Assistance. Outcome: Progressing   Problem: RH SAFETY Goal: RH STG ADHERE TO SAFETY PRECAUTIONS W/ASSISTANCE/DEVICE Description: STG Adhere to Safety Precautions With cues Outcome: Progressing   Problem: RH KNOWLEDGE DEFICIT Goal: RH STG INCREASE KNOWLEDGE OF DIABETES Outcome: Progressing Goal: RH STG INCREASE KNOWLEDGE OF HYPERTENSION Description: Patient manage care re: hypertension at discharge using educational materials independently Outcome: Progressing Goal: RH STG INCREASE KNOWLEDGE OF STROKE PROPHYLAXIS Outcome: Progressing   Problem: RH KNOWLEDGE DEFICIT Goal: RH STG INCREASE KNOWLEDGE OF HYPERTENSION Description: Patient manage care re: hypertension at discharge using educational materials independently Outcome: Progressing   Problem: RH KNOWLEDGE DEFICIT Goal: RH STG INCREASE KNOWLEDGE OF STROKE PROPHYLAXIS Outcome: Progressing

## 2023-09-02 NOTE — Progress Notes (Signed)
PROGRESS NOTE   Subjective/Complaints:  Pt doing well again today, slept well, denies pain, LBM this morning, urinating fine, denies any other complaints or concerns.   ROS: as per HPI. Denies CP, SOB, abd pain, N/V/D/C, or any other complaints at this time.    Objective:   No results found.    No results for input(s): "WBC", "HGB", "HCT", "PLT" in the last 72 hours.    No results for input(s): "NA", "K", "CL", "CO2", "GLUCOSE", "BUN", "CREATININE", "CALCIUM" in the last 72 hours.     Intake/Output Summary (Last 24 hours) at 09/02/2023 1354 Last data filed at 09/02/2023 0720 Gross per 24 hour  Intake 240 ml  Output 275 ml  Net -35 ml        Physical Exam: Vital Signs Blood pressure 132/80, pulse (!) 58, temperature (!) 97.5 F (36.4 C), resp. rate 18, height 5\' 8"  (1.727 m), weight 59.4 kg, SpO2 98%.   General: No acute distress, resting in bed. Easily awakens. Mood and affect are appropriate Heart: Regular rate and rhythm no rubs murmurs or extra sounds Lungs: Clear to auscultation, breathing unlabored, no rales or wheezes Abdomen: Positive bowel sounds, soft nontender to palpation, nondistended Extremities: No clubbing, cyanosis, or edema Skin: No evidence of breakdown, no evidence of rash over exposed surfaces.   PRIOR EXAMS: Neurologic: Cranial nerves II through XII intact, Musculoskeletal: Full range of motion in all 4 extremities. No joint swelling Cerebellar- moderate dysmetria R FNF Skin: No evidence of breakdown, no evidence of rash over exposed surfaces MsK: AFO on RLE-- not donned today   Neurologic: Cranial nerves--with right central VII, tongue deviation, motor strength is 5/5 in left and 4- R deltoid, bicep, tricep, grip,5/5 left and 3- right  hip flexor, knee extensors,  1/5 on right ADF/PF.  5/5 left ankle dorsiflexor and plantar flexor Sensory exam normal sensation to light touch  in  bilateral upper and lower extremities Reduced pinprick RUE and RLE as well as left foot Proprioception  Reduced RUE with sensory ataxia  Intact finger to thumb opposition but slow on Right side - Musculoskeletal: Full range of motion in all 4 extremities. No joint swelling   Assessment/Plan: 1. Functional deficits which require 3+ hours per day of interdisciplinary therapy in a comprehensive inpatient rehab setting. Physiatrist is providing close team supervision and 24 hour management of active medical problems listed below. Physiatrist and rehab team continue to assess barriers to discharge/monitor patient progress toward functional and medical goals  Care Tool:  Bathing    Body parts bathed by patient: Right arm, Chest, Abdomen, Front perineal area, Right upper leg, Buttocks, Left upper leg, Right lower leg, Left lower leg, Face, Left arm   Body parts bathed by helper: Left arm     Bathing assist Assist Level: Independent with assistive device     Upper Body Dressing/Undressing Upper body dressing   What is the patient wearing?: Pull over shirt    Upper body assist Assist Level: Independent    Lower Body Dressing/Undressing Lower body dressing      What is the patient wearing?: Pants, Underwear/pull up     Lower body assist Assist for lower body  dressing: Independent with assitive device     Toileting Toileting    Toileting assist Assist for toileting: Independent with assistive device     Transfers Chair/bed transfer  Transfers assist     Chair/bed transfer assist level: Supervision/Verbal cueing     Locomotion Ambulation   Ambulation assist      Assist level: Contact Guard/Touching assist Assistive device: Walker-rolling Max distance: 150 ft   Walk 10 feet activity   Assist     Assist level: Supervision/Verbal cueing Assistive device: Walker-rolling   Walk 50 feet activity   Assist Walk 50 feet with 2 turns activity did not occur:  Safety/medical concerns (fatigue)  Assist level: Supervision/Verbal cueing Assistive device: Walker-rolling    Walk 150 feet activity   Assist Walk 150 feet activity did not occur: Safety/medical concerns (fatigue)  Assist level: Supervision/Verbal cueing Assistive device: Walker-rolling    Walk 10 feet on uneven surface  activity   Assist Walk 10 feet on uneven surfaces activity did not occur: Safety/medical concerns         Wheelchair     Assist Is the patient using a wheelchair?: Yes Type of Wheelchair: Manual    Wheelchair assist level: Supervision/Verbal cueing Max wheelchair distance: 50 ft    Wheelchair 50 feet with 2 turns activity    Assist        Assist Level: Supervision/Verbal cueing   Wheelchair 150 feet activity     Assist      Assist Level: Moderate Assistance - Patient 50 - 74%   Blood pressure 132/80, pulse (!) 58, temperature (!) 97.5 F (36.4 C), resp. rate 18, height 5\' 8"  (1.727 m), weight 59.4 kg, SpO2 98%.  Medical Problem List and Plan: 1. Functional deficits secondary to left basal ganglia hemorrhage 08/11/2023 d/t hypertension and anticoagulation  with subsequent right hemiparesis             -patient may  shower -ELOS/Goals: 09/05/23 days, goals mod I with PT and OT and independent with SLP -PRAFO while in bed to stretch right heel cord. I suspect he will need an AFO for gait. -Continue CIR therapies including PT, OT   2.  Antithrombotics: -DVT/anticoagulation:  Pharmaceutical: Heparin d/ced amb distance >100'             -antiplatelet therapy: none >2wks post ICH , Dr Roda Shutters , neuro rec repeat CT head - this was reviewed by Dr Roda Shutters as well as Pearlean [name].  Neuro recommends start Eliquis and neuro f/u as OP 3. Pain Management: Tylenol and robaxin as needed   4. Mood/Behavior/Sleep: LCSW to evaluate and provide emotional support             -antipsychotic agents: n/a    5. Neuropsych/cognition: This patient is capable of  making decisions on his own behalf although he appears to have some short term memory deficits   6. Skin/Wound Care: Routine skin care checks   7. Fluids/Electrolytes/Nutrition: Routine Is and Os and follow-up chemistries   8: Hypertension/bradycardia: monitor TID and prn. Fair control at present             -Continue amlodipine 2.5 mg daily -Continue metoprolol tartrate  decreased to 12.5mg  BID on 08/20/23-- d/c'd Controlled 1/22- elevated this am on metoprolol , may need to increase amlodipine in am if this persists -1/25-26/25 BP/HR fine, cont regimen Vitals:   08/29/23 2003 08/30/23 0518 08/30/23 0801 08/30/23 1314  BP: 115/61 (!) 158/83 135/76 136/72   08/30/23 1933 08/31/23 0514 08/31/23 1649  08/31/23 1926  BP: 120/68 133/73 123/74 107/63   09/01/23 0457 09/01/23 1331 09/01/23 1954 09/02/23 0453  BP: 120/68 115/65 125/79 132/80    9: Hyperlipidemia: continue Lipitor 80 mg daily   10: Hypothyroidism: Continue Synthroid 125 mcg every morning   11: Tobacco use: Cessation counseling   12: Atrial fibrillation: Now on Eliquis 5mg  BID  Reached out to neurology to consider ASPIRE trial, may be done at OP visit. Continue amiodarone 200mg  daily  -rate is controlled   13: Chronic leukopenia: Follow-up CBC- stable WBCs    14: Mild thrombocytopenia: Follow-up CBC   15.  Sensory def due to CVA. Left foot  sensation may be related to neuropathy , not a diabetic but has hx of ETOH  LOS: 19 days A FACE TO FACE EVALUATION WAS PERFORMED  [id number] Trevionne Advani 09/02/2023, 1:54 PM

## 2023-09-02 NOTE — Plan of Care (Signed)
  Problem: Consults Goal: RH STROKE PATIENT EDUCATION Description: See Patient Education module for education specifics  Outcome: Progressing   Problem: RH BOWEL ELIMINATION Goal: RH STG MANAGE BOWEL WITH ASSISTANCE Description: STG Manage Bowel with mod I Assistance. Outcome: Progressing   Problem: RH SAFETY Goal: RH STG ADHERE TO SAFETY PRECAUTIONS W/ASSISTANCE/DEVICE Description: STG Adhere to Safety Precautions With cues Outcome: Progressing   Problem: RH KNOWLEDGE DEFICIT Goal: RH STG INCREASE KNOWLEDGE OF STROKE PROPHYLAXIS Outcome: Progressing

## 2023-09-03 ENCOUNTER — Telehealth: Payer: Self-pay | Admitting: Cardiology

## 2023-09-03 DIAGNOSIS — Z720 Tobacco use: Secondary | ICD-10-CM | POA: Diagnosis not present

## 2023-09-03 DIAGNOSIS — I10 Essential (primary) hypertension: Secondary | ICD-10-CM | POA: Diagnosis not present

## 2023-09-03 DIAGNOSIS — I610 Nontraumatic intracerebral hemorrhage in hemisphere, subcortical: Secondary | ICD-10-CM | POA: Diagnosis not present

## 2023-09-03 DIAGNOSIS — I482 Chronic atrial fibrillation, unspecified: Secondary | ICD-10-CM | POA: Diagnosis not present

## 2023-09-03 LAB — BASIC METABOLIC PANEL
Anion gap: 9 (ref 5–15)
BUN: 19 mg/dL (ref 8–23)
CO2: 28 mmol/L (ref 22–32)
Calcium: 9.2 mg/dL (ref 8.9–10.3)
Chloride: 99 mmol/L (ref 98–111)
Creatinine, Ser: 1.02 mg/dL (ref 0.61–1.24)
GFR, Estimated: >60 mL/min (ref >60)
Glucose, Bld: 92 mg/dL (ref 70–99)
Potassium: 4.4 mmol/L (ref 3.5–5.1)
Sodium: 136 mmol/L (ref 135–145)

## 2023-09-03 LAB — CBC
HCT: 39.5 % (ref 39.0–52.0)
Hemoglobin: 14.4 g/dL (ref 13.0–17.0)
MCH: 34.6 pg — ABNORMAL HIGH (ref 26.0–34.0)
MCHC: 36.5 g/dL — ABNORMAL HIGH (ref 30.0–36.0)
MCV: 95 fL (ref 80.0–100.0)
Platelets: 152 10*3/uL (ref 150–400)
RBC: 4.16 MIL/uL — ABNORMAL LOW (ref 4.22–5.81)
RDW: 11.8 % (ref 11.5–15.5)
WBC: 2.6 10*3/uL — ABNORMAL LOW (ref 4.0–10.5)
nRBC: 0 % (ref 0.0–0.2)

## 2023-09-03 NOTE — Telephone Encounter (Signed)
Pt spouse called in stating he is in the hospital and he had to cancel his consult with [facility]. She asked if this was something Dr. Mayford Knife wanted him to r/s when he gets out or is this something that could be handled in the hospital. Please advise.

## 2023-09-03 NOTE — Progress Notes (Signed)
Patient ID: Adrian [name], male   DOB: September 21, 1955, 68 y.o.   MRN: [id number]  SW spoke with pt ex-wife [name] to provide updates from team conference, d/c date remains 1/29, a discussed HH therapies recommended. SW will begin to explore HHAs and see who is able to accept since no preference, and likely challenges with obtaining HH. SW will follow-up with updates.   Cecile Sheerer, MSW, LCSW Office: [phone] Cell: [phone] Fax: [phone]

## 2023-09-03 NOTE — Progress Notes (Signed)
Physical Therapy Session Note  Patient Details  Name: Adrian [name] MRN: [id number] Date of Birth: 02/04/1956  Today's Date: 09/03/2023 PT Individual Time: 1422-1524 PT Individual Time Calculation (min): 62 min   Short Term Goals: Week 2:  PT Short Term Goal 1 (Week 2): Pt will perform stand pivot transfer with LRAD and supervision PT Short Term Goal 1 - Progress (Week 2): Met PT Short Term Goal 2 (Week 2): Pt will ambulate at least 100 feet using LRAD with light CGA. PT Short Term Goal 2 - Progress (Week 2): Met PT Short Term Goal 3 (Week 2): Pt will navigate at least 4 steps using BHR with overall minA. PT Short Term Goal 3 - Progress (Week 2): Met PT Short Term Goal 4 (Week 2): Pt will improve score on Berg Balance Test by at least the [facility]. PT Short Term Goal 4 - Progress (Week 2): Met PT Short Term Goal 5 (Week 2): Pt will improve quality of gait with ability to spend increased time on RLE in order to advance LLE past RLE consistently. PT Short Term Goal 5 - Progress (Week 2): Met Week 3:   STG = LTG d/t ELOS  Skilled Therapeutic Interventions/Progress Updates:  Patient seated upright on EOB on entrance to room. Patient alert and agreeable to PT session. NT relates that pt will be changing rooms d/t plumbing issue after therapy session and will now be in 4M09.  Patient with no pain complaint at start of session.  Therapeutic Activity: Bed Mobility: Pt performed supine <> sit with IND. Transfers: Pt performed sit<>stand and stand pivot transfers throughout session with supervision. No cueing required for technique.  Floor transfers initiated and provided pt with verbal instructions and physical demonstration prior to practice. Pt performs first attempt with light intermittent CGA and then improves to supervision > Mod I in final attempt. Good technique throughout.   Gait Training/ NMR:  Pt ambulated >252ft  using RW with supervision and vc for controlling quad eccentric control to  prevent hyperextension in RLE. Is able to improve control with focus until fatigued.   Guided in ambulation over 6" hurdles with focus not to rush, improve focus to R LE whether leading or trailing to clear the hurdle. Significant improvement in ability to perform hip flexion and clear throughout. Then guided in slalom around all 6 hurdles placed 3 ft apart and to focus on full clearance without early turn and knocking into obstacle. Not all obstacles at home or in community are as mobile. Improved performance throughout.   NMR performed for improvements in motor control and coordination, balance, sequencing, judgement, and self confidence/ efficacy in performing all aspects of mobility at highest level of independence.   Patient seated on EOB at end of session with brakes locked, bed alarm set, and all needs within reach.  Therapy Documentation Precautions:  Precautions Precautions: Fall Precaution Comments: R hemi, mild ataxia Restrictions Weight Bearing Restrictions Per Provider Order: No  Pain:  No pain related this session.   Balance: Standardized Balance Assessment Standardized Balance Assessment:  (5xSTS = 13.98 sec)  Timed Up and Go Test TUG: Normal TUG = 23.3 sec  Therapy/Group: Individual Therapy  Loel Dubonnet PT, DPT, CSRS 09/03/2023, 6:23 PM

## 2023-09-03 NOTE — Progress Notes (Signed)
Occupational Therapy Session Note  Patient Details  Name: Adrian [name] MRN: [id number] Date of Birth: 10-18-1955  Today's Date: 09/03/2023 OT Individual Time: 0102-7253 OT Individual Time Calculation (min): 74 min    Short Term Goals: Week 3:  OT Short Term Goal 1 (Week 3): STGS = LTGs  Skilled Therapeutic Interventions/Progress Updates:  Pt greeted supine in bed, pt agreeable to OT intervention.      Transfers/bed mobility/functional mobility: pt completed bed mobility MODI. Ambualtory transfers with RW with distant supervision.   Therapeutic activity: pt completed dynamic reaching task in standing with RUE with pt reaching to grasp bocce balls to provide weighted sensory feedback to affected UE and then place balls in basketball hoop with an emphasis on wrist supination/pronation for higher level ADLS. Pt completed task with supervision.    ADLs:  Grooming: pt stood at sink for oral care MODI UB dressing:pt donned OH shirt from EOB MODI LB dressing: pt donned pants and underwear MODI from EOB  Footwear: pt donnes shoes/socks/AFO from EOB MODI  Bathing: pt completed bathing seated/standing from shower seat MODI Transfers: ADL transfers with RW MODI  IADLS:pt completed ambulatory IADL task of simulated laundry with pt able to use reacher to retrieve clothes from floor level hamper with supervision. Pt then transported items in reusable bag with RW and supervision. Pt able to transfer clothing to washer/dryer with supervision using reacher as needed for energy conservation. Pt then able to stand to fold items with no UE support and supervision.   Ended session with pt supine in bed with all needs within reach and bed alarm activated.                    Therapy Documentation Precautions:  Precautions Precautions: Fall Precaution Comments: R hemi, mild ataxia Restrictions Weight Bearing Restrictions Per Provider Order: No  Pain: No pain   Therapy/Group: Individual  Therapy  [name] [facility] 09/03/2023, 12:09 PM

## 2023-09-03 NOTE — Progress Notes (Signed)
Occupational Therapy Session Note  Patient Details  Name: Adrian [name] MRN: [id number] Date of Birth: [birth year]  Today's Date: 09/03/2023 OT Individual Time: 0945-1100 OT Individual Time Calculation (min): 75 min    Short Term Goals: Week 3:  OT Short Term Goal 1 (Week 3): STGS = LTGs  Skilled Therapeutic Interventions/Progress Updates:    Pt received resting in bed ready for therapy.  Pt sat to EOB independently, stood independently and then used RW to ambulate to gym.  Focus of session on learning exercises that he can do at home for RUE coordination and stabilization. He now has full AROM but with some ataxia of RUE.   Pt sat on mat and worked on floor transfer with CGA to ensure pt lowering to ground  safely. Pt cued to rotate to L hip and then step R leg back to floor to lower eccentrically with L leg.   In quadriped, worked on modified bird dog with extending L arm for forced use of R arm,  modified planks with marching hands forward and back and mini push ups.  Practice moving from quadriped to long sit to supine. Pt engaged in bridges, ball squeezes between knees,  holding ball in B hands and extending overhead to tricep extensions.   Pt practiced supine to long sit to quad to half kneeling to sitting on mat 4x with close supervision.  Pt then worked on isometric wall push ups, then pushups, added on to challenge by having pt step further away from wall to continue pushups with guarding support, and added on a few push up jumps.    Using counter height table,  pt worked on deep pressure through hand as if he was scrubbing the table for coordination challenge.   Pt then rested briefly and then ambulated back to room with supervision with RW.  Resting in room with all needs met.     Therapy Documentation Precautions:  Precautions Precautions: Fall Precaution Comments: R hemi, mild ataxia Restrictions Weight Bearing Restrictions Per Provider Order: No    Pain: Pain  Assessment Pain Score: 0-No pain    Therapy/Group: Individual Therapy  Kieryn Burtis 09/03/2023, 12:20 PM

## 2023-09-03 NOTE — Telephone Encounter (Signed)
Transmission received 08/30/2023.

## 2023-09-03 NOTE — Progress Notes (Signed)
PROGRESS NOTE   Subjective/Complaints:  No issues overnite  Discussed BP elevated noted at [facility] onset Discussed fall with facial bruising which preceded admission 06/08/23- neg CT head   ROS: as per HPI. Denies CP, SOB, abd pain, N/V/D/C, or any other complaints at this time.    Objective:   No results found.    Recent Labs    09/03/23 0552  WBC 2.6*  HGB 14.4  HCT 39.5  PLT 152      Recent Labs    09/03/23 0552  NA 136  K 4.4  CL 99  CO2 28  GLUCOSE 92  BUN 19  CREATININE 1.02  CALCIUM 9.2       Intake/Output Summary (Last 24 hours) at 09/03/2023 0801 Last data filed at 09/03/2023 0500 Gross per 24 hour  Intake 720 ml  Output 600 ml  Net 120 ml        Physical Exam: Vital Signs Blood pressure 135/77, pulse (!) 57, temperature 97.6 F (36.4 C), resp. rate 17, height 5\' 8"  (1.727 m), weight 59.4 kg, SpO2 99%.   General: No acute distress, resting in bed. Easily awakens. Mood and affect are appropriate Heart: Regular rate and rhythm no rubs murmurs or extra sounds Lungs: Clear to auscultation, breathing unlabored, no rales or wheezes Abdomen: Positive bowel sounds, soft nontender to palpation, nondistended Extremities: No clubbing, cyanosis, or edema Skin: No evidence of breakdown, no evidence of rash over exposed surfaces.   PRIOR EXAMS: Neurologic: Cranial nerves II through XII intact, Musculoskeletal: Full range of motion in all 4 extremities. No joint swelling Cerebellar- moderate dysmetria R FNF Skin: No evidence of breakdown, no evidence of rash over exposed surfaces MsK: AFO on RLE-- not donned today   Neurologic: Cranial nerves--with right central VII, tongue deviation, motor strength is 5/5 in left and 4- R deltoid, bicep, tricep, grip,5/5 left and 4- right  hip flexor, knee extensors,  1/5 on right ADF/PF.  5/5 left ankle dorsiflexor and plantar flexor  PRIOR Reduced pinprick RUE  and RLE as well as left foot Proprioception  Reduced RUE with sensory ataxia  Intact finger to thumb opposition but slow on Right side - Musculoskeletal: Full range of motion in all 4 extremities. No joint swelling   Assessment/Plan: 1. Functional deficits which require 3+ hours per day of interdisciplinary therapy in a comprehensive inpatient rehab setting. Physiatrist is providing close team supervision and 24 hour management of active medical problems listed below. Physiatrist and rehab team continue to assess barriers to discharge/monitor patient progress toward functional and medical goals  Care Tool:  Bathing    Body parts bathed by patient: Right arm, Chest, Abdomen, Front perineal area, Right upper leg, Buttocks, Left upper leg, Right lower leg, Left lower leg, Face, Left arm   Body parts bathed by helper: Left arm     Bathing assist Assist Level: Independent with assistive device     Upper Body Dressing/Undressing Upper body dressing   What is the patient wearing?: Pull over shirt    Upper body assist Assist Level: Independent    Lower Body Dressing/Undressing Lower body dressing      What is the patient wearing?:  Pants, Underwear/pull up     Lower body assist Assist for lower body dressing: Independent with assitive device     Toileting Toileting    Toileting assist Assist for toileting: Independent with assistive device     Transfers Chair/bed transfer  Transfers assist     Chair/bed transfer assist level: Supervision/Verbal cueing     Locomotion Ambulation   Ambulation assist      Assist level: Contact Guard/Touching assist Assistive device: Walker-rolling Max distance: 150 ft   Walk 10 feet activity   Assist     Assist level: Supervision/Verbal cueing Assistive device: Walker-rolling   Walk 50 feet activity   Assist Walk 50 feet with 2 turns activity did not occur: Safety/medical concerns (fatigue)  Assist level:  Supervision/Verbal cueing Assistive device: Walker-rolling    Walk 150 feet activity   Assist Walk 150 feet activity did not occur: Safety/medical concerns (fatigue)  Assist level: Supervision/Verbal cueing Assistive device: Walker-rolling    Walk 10 feet on uneven surface  activity   Assist Walk 10 feet on uneven surfaces activity did not occur: Safety/medical concerns         Wheelchair     Assist Is the patient using a wheelchair?: Yes Type of Wheelchair: Manual    Wheelchair assist level: Supervision/Verbal cueing Max wheelchair distance: 50 ft    Wheelchair 50 feet with 2 turns activity    Assist        Assist Level: Supervision/Verbal cueing   Wheelchair 150 feet activity     Assist      Assist Level: Moderate Assistance - Patient 50 - 74%   Blood pressure 135/77, pulse (!) 57, temperature 97.6 F (36.4 C), resp. rate 17, height 5\' 8"  (1.727 m), weight 59.4 kg, SpO2 99%.  Medical Problem List and Plan: 1. Functional deficits secondary to left basal ganglia hemorrhage 08/11/2023 d/t hypertension and anticoagulation  with subsequent right hemiparesis             -patient may  shower -ELOS/Goals: 09/05/23 days, goals mod I with PT and OT and independent with SLP -PRAFO while in bed to stretch right heel cord. I suspect he will need an AFO for gait. -Continue CIR therapies including PT, OT   2.  Antithrombotics: -DVT/anticoagulation:  Pharmaceutical: Heparin d/ced amb distance >100'             -antiplatelet therapy: none >2wks post ICH , Dr Roda Shutters , neuro rec repeat CT head - this was reviewed by Dr Roda Shutters as well as Pearlean [name].  Neuro recommends start Eliquis and neuro f/u as OP 3. Pain Management: Tylenol and robaxin as needed   4. Mood/Behavior/Sleep: LCSW to evaluate and provide emotional support             -antipsychotic agents: n/a    5. Neuropsych/cognition: This patient is capable of making decisions on his own behalf although he appears to  have some short term memory deficits   6. Skin/Wound Care: Routine skin care checks   7. Fluids/Electrolytes/Nutrition: Routine Is and Os and follow-up chemistries      Latest Ref Rng & Units 09/03/2023    5:52 AM 08/27/2023    4:50 AM 08/20/2023    5:11 AM  BMP  Glucose 70 - 99 mg/dL 92  97  95   BUN 8 - 23 mg/dL 19  20  24    Creatinine 0.61 - 1.24 mg/dL 1.61  0.96  0.45   Sodium 135 - 145 mmol/L 136  132  129   Potassium 3.5 - 5.1 mmol/L 4.4  4.2  4.2   Chloride 98 - 111 mmol/L 99  97  97   CO2 22 - 32 mmol/L 28  25  24    Calcium 8.9 - 10.3 mg/dL 9.2  9.2  9.2   1/61 wnl  8: Hypertension/bradycardia: monitor TID and prn. Fair control at present             -Continue amlodipine 2.5 mg daily -Continue metoprolol tartrate  decreased to 12.5mg  BID on 08/20/23-- d/c'd Controlled 1/22- elevated this am on metoprolol , may need to increase amlodipine in am if this persists -1/25-26/25 BP/HR fine, cont regimen Vitals:   08/30/23 1314 08/30/23 1933 08/31/23 0514 08/31/23 1649  BP: 136/72 120/68 133/73 123/74   08/31/23 1926 09/01/23 0457 09/01/23 1331 09/01/23 1954  BP: 107/63 120/68 115/65 125/79   09/02/23 0453 09/02/23 1551 09/02/23 2002 09/03/23 0503  BP: 132/80 137/83 126/70 135/77    9: Hyperlipidemia: continue Lipitor 80 mg daily   10: Hypothyroidism: Continue Synthroid 125 mcg every morning   11: Tobacco use: Cessation counseling   12: Atrial fibrillation: Now on Eliquis 5mg  BID  Reached out to neurology to consider ASPIRE trial, may be done at OP visit. Continue amiodarone 200mg  daily  -rate is controlled   13: Chronic leukopenia: Follow-up CBC- stable WBCs    14: Mild thrombocytopenia: Follow-up CBC   15.  Sensory def due to CVA. Left foot  sensation may be related to neuropathy , not a diabetic but has hx of ETOH as well as chemo therapy ~66yrs ago with cisplatin   LOS: 20 days A FACE TO FACE EVALUATION WAS PERFORMED  Erick Colace 09/03/2023, 8:01 AM

## 2023-09-04 DIAGNOSIS — I10 Essential (primary) hypertension: Secondary | ICD-10-CM

## 2023-09-04 DIAGNOSIS — I482 Chronic atrial fibrillation, unspecified: Secondary | ICD-10-CM

## 2023-09-04 DIAGNOSIS — Z720 Tobacco use: Secondary | ICD-10-CM

## 2023-09-04 DIAGNOSIS — I610 Nontraumatic intracerebral hemorrhage in hemisphere, subcortical: Secondary | ICD-10-CM

## 2023-09-04 NOTE — Progress Notes (Signed)
Physical Therapy Discharge Summary  Patient Details  Name: Adrian [name] MRN: [id number] Date of Birth: 05-Jul-1956  Date of Discharge from PT service:September 04, 2023  Today's Date: 09/04/2023 PT Individual Time: 1505-1610 PT Individual Time Calculation (min): 65 min    Patient has met {NUMBERS 0-12:18577} of {NUMBERS 0-12:18577} long term goals due to improved activity tolerance, improved balance, increased strength, increased range of motion, ability to compensate for deficits, functional use of  right upper extremity and right lower extremity, improved attention, improved awareness, and improved coordination.  Patient to discharge at an ambulatory level Modified Independent.   Patient's care partner is independent to provide the necessary physical and cognitive assistance at discharge.  Reasons goals not met: ***  Recommendation:  Patient will benefit from ongoing skilled PT services in home health setting to continue to advance safe functional mobility, address ongoing impairments in strength, coordination, balance, activity tolerance, cognition, safety awareness, and to minimize fall risk.  Equipment: RW, TTB  Reasons for discharge: treatment goals met and discharge from hospital  Patient/family agrees with progress made and goals achieved: Yes  PT Discharge Precautions/Restrictions   Vital Signs   Pain   Pain Interference   Vision/Perception     Cognition Overall Cognitive Status: Within Functional Limits for tasks assessed Arousal/Alertness: Awake/alert Sensation Coordination 9 Hole Peg Test: L: 25.6 R: 54.5 seconds Motor     Mobility   Locomotion     Trunk/Postural Assessment     Balance Balance Balance Assessed: Yes Standardized Balance Assessment Standardized Balance Assessment: Timed Up and Go Test;Berg Balance Test (5xSTS = 13.98 sec) Berg Balance Test Sit to Stand: Able to stand without using hands and stabilize independently Standing Unsupported:  Able to stand 2 minutes with supervision Sitting with Back Unsupported but Feet Supported on Floor or Stool: Able to sit safely and securely 2 minutes Stand to Sit: Sits safely with minimal use of hands Transfers: Able to transfer safely, definite need of hands Standing Unsupported with Eyes Closed: Able to stand 10 seconds with supervision Standing Ubsupported with Feet Together: Able to place feet together independently but unable to hold for 30 seconds From Standing, Reach Forward with Outstretched Arm: Can reach forward >12 cm safely (5") From Standing Position, Pick up Object from Floor: Able to pick up shoe, needs supervision From Standing Position, Turn to Look Behind Over each Shoulder: Looks behind one side only/other side shows less weight shift Turn 360 Degrees: Able to turn 360 degrees safely but slowly Standing Unsupported, Alternately Place Feet on Step/Stool: Able to complete >2 steps/needs minimal assist Standing Unsupported, One Foot in Front: Able to plae foot ahead of the other independently and hold 30 seconds Standing on One Leg: Able to lift leg independently and hold equal to or more than 3 seconds Total Score: 40 Timed Up and Go Test TUG: Normal TUG Normal TUG (seconds): 23.3 Static Sitting Balance Static Sitting - Balance Support: Feet supported Static Sitting - Level of Assistance: 7: Independent Dynamic Sitting Balance Dynamic Sitting - Balance Support: Feet supported Dynamic Sitting - Level of Assistance: 7: Independent Static Standing Balance Static Standing - Balance Support: No upper extremity supported;During functional activity Static Standing - Level of Assistance: 5: Stand by assistance Dynamic Standing Balance Dynamic Standing - Balance Support: During functional activity;Bilateral upper extremity supported Dynamic Standing - Level of Assistance: 5: Stand by assistance Extremity Assessment            Loel Dubonnet 09/04/2023, 10:26 AM

## 2023-09-04 NOTE — Progress Notes (Signed)
Inpatient Rehabilitation Discharge Medication Review by a Pharmacist  A complete drug regimen review was completed for this patient to identify any potential clinically significant medication issues.  High Risk Drug Classes Is patient taking? Indication by Medication  Antipsychotic No   Anticoagulant Yes Apixaban- AF  Antibiotic No   Opioid No   Antiplatelet No   Hypoglycemics/insulin No   Vasoactive Medication Yes Norvasc- HTN Amiodarone- rate control  Chemotherapy No   Other Yes Lipitor- HLD Synthroid- hypothyroidism     Type of Medication Issue Identified Description of Issue Recommendation(s)  Drug Interaction(s) (clinically significant)     Duplicate Therapy     Allergy     No Medication Administration End Date     Incorrect Dose     Additional Drug Therapy Needed     Significant med changes from prior encounter (inform family/care partners about these prior to discharge).    Other       Clinically significant medication issues were identified that warrant physician communication and completion of prescribed/recommended actions by midnight of the next day:  No   Time spent performing this drug regimen review (minutes):  30   Dimarco Minkin BS, PharmD, BCPS Clinical Pharmacist 09/04/2023 7:17 AM  Contact: [phone] after 3 PM  "Be curious, not judgmental..." -Debbora Dus

## 2023-09-04 NOTE — Progress Notes (Signed)
Physical Therapy Session Note  Patient Details  Name: Adrian [name] MRN: [id number] Date of Birth: [birth year]  Today's Date: 09/04/2023 PT Individual Time: 0901-0959 PT Individual Time Calculation (min): 58 min   Short Term Goals: Week 2:  PT Short Term Goal 1 (Week 2): Pt will perform stand pivot transfer with LRAD and supervision PT Short Term Goal 1 - Progress (Week 2): Met PT Short Term Goal 2 (Week 2): Pt will ambulate at least 100 feet using LRAD with light CGA. PT Short Term Goal 2 - Progress (Week 2): Met PT Short Term Goal 3 (Week 2): Pt will navigate at least 4 steps using BHR with overall minA. PT Short Term Goal 3 - Progress (Week 2): Met PT Short Term Goal 4 (Week 2): Pt will improve score on Berg Balance Test by at least the [facility]. PT Short Term Goal 4 - Progress (Week 2): Met PT Short Term Goal 5 (Week 2): Pt will improve quality of gait with ability to spend increased time on RLE in order to advance LLE past RLE consistently. PT Short Term Goal 5 - Progress (Week 2): Met Week 3:   STG = LTG d/t ELOS  Skilled Therapeutic Interventions/Progress Updates:  Patient seated upright in w/c on entrance to room. Patient alert and agreeable to PT session.   Patient with no pain complaint at start of session.  Therapeutic Activity: Transfers: Pt performed sit<>stand and stand pivot transfers throughout session with Mod I using RW.   Car transfer performed with supervision/ distant supervision and no cues for technique. Uses BUE to assist RLE into footwell. Is also able to close and re-open RW while seated at edge of car seat.   Gait Training:  Pt ambulated 170' x1/ 140' x1/ >200' x1 using RW with varying distances of supervision. Demonstrated continued focus on maintaining constant push of RW while also controlling R knee hyperextension during terminal phase of gait cycle. Provided vc/ tc for reminder of focus prior to gait.   Is able to ambulate up/ down twelve 6-in steps using  RHR only with vc provided for preparing R knee for increased stability prior to stepping up to step with LLE. Improved stability in R knee exhibited throughout.   Neuromuscular Re-ed: NMR facilitated during session with focus on standing balance. Pt guided in completion of Berg balance test. Patient demonstrates increased fall risk as noted by score of  40 /56 on Berg Balance Scale.  (<36= high risk for falls, close to 100%; 37-45 significant >80%; 46-51 moderate >50%; 52-55 lower >25%). Pt also demonstrate significant improvement in balance with increase in score of 7 points over previous score. See discharge summary for test and scoring. Continues to demo difficulty with more NBOS tasks, and more dynamic tasks.   NMR performed for improvements in motor control and coordination, balance, sequencing, judgement, and self confidence/ efficacy in performing all aspects of mobility at highest level of independence.   Patient supine in bed at end of session with brakes locked, bed alarm set, and all needs within reach.   Therapy Documentation Precautions:  Precautions Precautions: Fall Precaution Comments: R hemi, mild ataxia Restrictions Weight Bearing Restrictions Per Provider Order: No  Pain:  No pain related this session.    Therapy/Group: Individual Therapy  Loel Dubonnet PT, DPT, CSRS 09/04/2023, 10:10 AM

## 2023-09-04 NOTE — Progress Notes (Signed)
Patient ID: Adrian [name], male   DOB: [birth year], 68 y.o.   MRN: [id number]  [id number] spoke with pt ex-wife [name] to inform after reviewing with staff who submit insurance auth form CIR, reports that BCBS remains primary and has approved for him to be here, despite having the letter. SW explained barriers to securing HH due to insurance. SW encouraged her to follow-up with insurance through his previous employer and to call BCBS as well. SW shared will continue to pursue [facility], and there may not be an answer until after he discharges. Pt will be set up for outpatient therapies in event unable to secure; at [facility] location for PT/OT/SLP.  SW faxed outpatient referral to [facility] location.   SW spoke with Erica[facility] about referral. Reports no OT. SW will follow-up once more updates on if OT is needed; would be accepted for PT/SLP. *Updates include pt no longer has BCBS and has a state Medicare plan that is unknown. Declined to accept at this time.   Per OT, pt needs continued OT therapies.  *SW received message from pt wife reporting pt coverage should now show Medicare as primary. SW sent HHPT/OT/SLP to [facility] and waiting on follow-up.   Declined HHAs Amy/[facility]- not in network Lynette/[facility] Cory/[facility]- staffing Angie/[facility]- not in network Carolyn[facility] Cheryl/[facility]- declined  Cecile Sheerer, MSW, LCSW Office: [phone] Cell: [phone] Fax: [phone]

## 2023-09-04 NOTE — Plan of Care (Signed)
Problem: RH Swallowing Goal: LTG Patient will consume least restrictive diet using compensatory strategies with assistance (SLP) Description: LTG:  Patient will consume least restrictive diet using compensatory strategies with assistance (SLP) Outcome: Completed/Met   Problem: RH Expression Communication Goal: LTG Patient will increase speech intelligibility (SLP) Description: LTG: Patient will increase speech intelligibility at word/phrase/conversation level with cues, % of the time (SLP) Outcome: Completed/Met   Problem: RH Problem Solving Goal: LTG Patient will demonstrate problem solving for (SLP) Description: LTG:  Patient will demonstrate problem solving for basic/complex daily situations with cues  (SLP) Outcome: Completed/Met   Problem: RH Memory Goal: LTG Patient will use memory compensatory aids to (SLP) Description: LTG:  Patient will use memory compensatory aids to recall biographical/new, daily complex information with cues (SLP) Outcome: Completed/Met

## 2023-09-04 NOTE — Progress Notes (Signed)
PROGRESS NOTE   Subjective/Complaints:  No new c/o , discussed d/c process, pt also had questions regarding purpose of neuropsych   ROS: as per HPI. Denies CP, SOB, abd pain, N/V/D/C, or any other complaints at this time.    Objective:   No results found.    Recent Labs    09/03/23 0552  WBC 2.6*  HGB 14.4  HCT 39.5  PLT 152      Recent Labs    09/03/23 0552  NA 136  K 4.4  CL 99  CO2 28  GLUCOSE 92  BUN 19  CREATININE 1.02  CALCIUM 9.2       Intake/Output Summary (Last 24 hours) at 09/04/2023 0758 Last data filed at 09/04/2023 0745 Gross per 24 hour  Intake 2834 ml  Output 700 ml  Net 2134 ml        Physical Exam: Vital Signs Blood pressure (!) 141/74, pulse 61, temperature (!) 97.4 F (36.3 C), temperature source Oral, resp. rate 16, height 5\' 8"  (1.727 m), weight 59.4 kg, SpO2 100%.   General: No acute distress, resting in bed. Easily awakens. Mood and affect are appropriate Heart: Regular rate and rhythm no rubs murmurs or extra sounds Lungs: Clear to auscultation, breathing unlabored, no rales or wheezes Abdomen: Positive bowel sounds, soft nontender to palpation, nondistended Extremities: No clubbing, cyanosis, or edema Skin: No evidence of breakdown, no evidence of rash over exposed surfaces.   Neurologic: Cranial nerves II through XII intact, Musculoskeletal: Full range of motion in all 4 extremities. No joint swelling Cerebellar- moderate dysmetria R FNF Skin: No evidence of breakdown, no evidence of rash over exposed surfaces    Neurologic: Cranial nerves--with right central VII, tongue deviation, motor strength is 5/5 in left and 4- R deltoid, bicep, tricep, grip,5/5 left and 4- right  hip flexor, knee extensors,  1/5 on right ADF/PF.  5/5 left ankle dorsiflexor and plantar flexor  PRIOR Reduced pinprick RUE and RLE as well as left foot Proprioception  Reduced RUE with sensory  ataxia  Intact finger to thumb opposition but slow on Right side - Musculoskeletal: Full range of motion in all 4 extremities. No joint swelling   Assessment/Plan: 1. Functional deficits which require 3+ hours per day of interdisciplinary therapy in a comprehensive inpatient rehab setting. Physiatrist is providing close team supervision and 24 hour management of active medical problems listed below. Physiatrist and rehab team continue to assess barriers to discharge/monitor patient progress toward functional and medical goals  Care Tool:  Bathing    Body parts bathed by patient: Right arm, Chest, Abdomen, Front perineal area, Right upper leg, Buttocks, Left upper leg, Right lower leg, Left lower leg, Face, Left arm   Body parts bathed by helper: Left arm     Bathing assist Assist Level: Independent with assistive device     Upper Body Dressing/Undressing Upper body dressing   What is the patient wearing?: Pull over shirt    Upper body assist Assist Level: Independent    Lower Body Dressing/Undressing Lower body dressing      What is the patient wearing?: Pants, Underwear/pull up     Lower body assist Assist for  lower body dressing: Independent with assitive device     Toileting Toileting    Toileting assist Assist for toileting: Independent with assistive device     Transfers Chair/bed transfer  Transfers assist     Chair/bed transfer assist level: Supervision/Verbal cueing     Locomotion Ambulation   Ambulation assist      Assist level: Contact Guard/Touching assist Assistive device: Walker-rolling Max distance: 150 ft   Walk 10 feet activity   Assist     Assist level: Supervision/Verbal cueing Assistive device: Walker-rolling   Walk 50 feet activity   Assist Walk 50 feet with 2 turns activity did not occur: Safety/medical concerns (fatigue)  Assist level: Supervision/Verbal cueing Assistive device: Walker-rolling    Walk 150 feet  activity   Assist Walk 150 feet activity did not occur: Safety/medical concerns (fatigue)  Assist level: Supervision/Verbal cueing Assistive device: Walker-rolling    Walk 10 feet on uneven surface  activity   Assist Walk 10 feet on uneven surfaces activity did not occur: Safety/medical concerns         Wheelchair     Assist Is the patient using a wheelchair?: Yes Type of Wheelchair: Manual    Wheelchair assist level: Supervision/Verbal cueing Max wheelchair distance: 50 ft    Wheelchair 50 feet with 2 turns activity    Assist        Assist Level: Supervision/Verbal cueing   Wheelchair 150 feet activity     Assist      Assist Level: Moderate Assistance - Patient 50 - 74%   Blood pressure (!) 141/74, pulse 61, temperature (!) 97.4 F (36.3 C), temperature source Oral, resp. rate 16, height 5\' 8"  (1.727 m), weight 59.4 kg, SpO2 100%.  Medical Problem List and Plan: 1. Functional deficits secondary to left basal ganglia hemorrhage 08/11/2023 d/t hypertension and anticoagulation  with subsequent right hemiparesis             -patient may  shower -ELOS/Goals: 1/29/25goals mod I with PT and OT and independent with SLP -PRAFO while in bed to stretch right heel cord. I suspect he will need an AFO for gait. -Continue CIR therapies including PT, OT   2.  Antithrombotics: -DVT/anticoagulation:  Pharmaceutical: Heparin d/ced amb distance >100'             -antiplatelet therapy: none >2wks post ICH , Dr Roda Shutters , neuro rec repeat CT head - this was reviewed by Dr Roda Shutters as well as Pearlean [name].  Neuro recommends start Eliquis and neuro f/u as OP 3. Pain Management: Tylenol and robaxin as needed   4. Mood/Behavior/Sleep: LCSW to evaluate and provide emotional support             -antipsychotic agents: n/a Chart lists ETOH abuse but on admit ethanol level <10    5. Neuropsych/cognition: This patient is capable of making decisions on his own behalf although he has mild exp  aphasia    6. Skin/Wound Care: Routine skin care checks   7. Fluids/Electrolytes/Nutrition: Routine Is and Os and follow-up chemistries      Latest Ref Rng & Units 09/03/2023    5:52 AM 08/27/2023    4:50 AM 08/20/2023    5:11 AM  BMP  Glucose 70 - 99 mg/dL 92  97  95   BUN 8 - 23 mg/dL 19  20  24    Creatinine 0.61 - 1.24 mg/dL 0.98  1.19  1.47   Sodium 135 - 145 mmol/L 136  132  129  Potassium 3.5 - 5.1 mmol/L 4.4  4.2  4.2   Chloride 98 - 111 mmol/L 99  97  97   CO2 22 - 32 mmol/L 28  25  24    Calcium 8.9 - 10.3 mg/dL 9.2  9.2  9.2   1/19 wnl  8: Hypertension/bradycardia: monitor TID and prn. Fair control at present             -Continue amlodipine 2.5 mg daily -Continue metoprolol tartrate  decreased to 12.5mg  BID on 08/20/23-- d/c'd Controlled 1/22- elevated this am on metoprolol , may need to increase amlodipine in am if this persists -1/25-26/25 BP/HR fine, cont regimen Vitals:   08/31/23 1926 09/01/23 0457 09/01/23 1331 09/01/23 1954  BP: 107/63 120/68 115/65 125/79   09/02/23 0453 09/02/23 1551 09/02/23 2002 09/03/23 0503  BP: 132/80 137/83 126/70 135/77   09/03/23 1341 09/03/23 1536 09/03/23 2014 09/04/23 0550  BP: 133/73 138/88 136/83 (!) 141/74    9: Hyperlipidemia: continue Lipitor 80 mg daily   10: Hypothyroidism: Continue Synthroid 125 mcg every morning   11: Tobacco use: Cessation counseling   12: Atrial fibrillation: Now on Eliquis 5mg  BID  Reached out to neurology to consider ASPIRE trial, may be done at OP visit. Continue amiodarone 200mg  daily  -rate is controlled   13: Chronic leukopenia: Follow-up CBC- stable WBCs    14: Mild thrombocytopenia: Follow-up CBC   15.  Sensory def due to CVA. Left foot  sensation may be related to neuropathy , not a diabetic but has hx of ETOH as well as chemo therapy ~22yrs ago with cisplatin   LOS: 21 days A FACE TO FACE EVALUATION WAS PERFORMED  Erick Colace 09/04/2023, 7:58 AM

## 2023-09-04 NOTE — Consult Note (Signed)
Neuropsychological Consultation Comprehensive Inpatient Rehab   Patient:   Adrian [name]   DOB:   September 05, 1955  MR Number:  [id number]  Location:  [facility] [address] [place] [address] Dept: [phone] Loc: [phone]           Date of Service:   09/04/2023  Start Time:   2 PM End Time:   3 PM  Provider/Observer:  Arley Phenix, Psy.D.       Clinical Neuropsychologist       Billing Code/Service: [phone]  Reason for Service:    Adrian [name] is a 68 year old male referred for neuropsychological consultation due to coping and adjustment issues after nontraumatic subcortical hemorrhage.  Patient is currently admitted onto the comprehensive inpatient rehabilitation unit.  Patient had presented to [facility] emergency department on 08/11/2023 given good detail to events just prior.  Patient noted that he woke up in the morning in his usual state of health.  Patient noted that approximately 11 AM he noted abrupt onset of right-sided weakness.  Patient was transported to the emergency department via code stroke.  Patient was hypertensive with systolic blood pressure to 230 per EMS.  CT scan revealed left basal ganglia intracranial hemorrhage with intraventricular extension.  Close control of her blood pressure was initiated and patient was admitted to ICU for strict blood pressure control.  Past medical history has included A-fib on Eliquis, carotid artery stenosis, hypothyroidism and previous ablation.  Chronic left ICA occlusion is noted along with tobacco use, hyperlipidemia, chronic leukopenia.  Physical exam had noted right-sided facial droop, right upper and lower extremity weakness and dysarthria.  Once patient was stabilized medically and therapy evaluations were completed patient was admitted to CIR due to ongoing dysfunction secondary to his left basal ganglia region intracranial hemorrhage.  During today's  visit, the patient was oriented and appeared to be at baseline cognitively.  Patient had lots of questions as to why he may have had such high blood pressure noting that he had not had this situation before.  Patient noted right-sided motor deficits but also noted that he has improved considerably over the past week.  Patient with weak motor strength and weak fine motor control for right hand and self-reports issues with right leg.  Very mild facial droop may have been visible today.  Patient notes that he is concerned about how he will return and improve over time and we addressed this issue with what to expect going forward.  Today we worked on coping and adjustment issues with residual effects of his subcortical hemorrhage.  HPI for the current admission:    HPI: Adrian [name] is a 68 year old male who presented to the emergency department on 08/11/2023 reporting he woke up in his usual state of health and at approximately 11 AM noted abrupt onset of right-sided weakness. He was brought to the emergency department as code stroke. He was hypertensive to systolic blood pressure to 230 per EMS. CT scan of the head without contrast revealed left basal ganglia intracranial hemorrhage with intraventricular extension. CTA showed spots sign and blood pressure parameters were tightened to a goal of 110-130. He was admitted to the ICU for strict blood pressure control. His past medical history significant for atrial fibrillation on Eliquis, carotid artery stenosis, hypothyroidism, dispose loop recorder placed 06/19/2023 post A-fib ablation, chronic left ICA occlusion, tobacco use, hyperlipidemia, chronic leukopenia. Examination he exhibited right-sided facial droop, right  upper and lower extremity weakness and dysarthria. 2D echo performed with left ventricular ejection fraction 60 to 65%. Hemoglobin A1c 5.6%. SCDs placed for VTE prophylaxis. The patient will follow-up with Dr. Pearlean [name] as an outpatient to consider ASPIRE  trial if interested. Alternatively to restart Eliquis in 2 weeks if stable or follow-up with cardiology for other options including Watchman device. He continues on amlodipine 2.5 mg daily and Lopressor 25 mg daily. Lipitor increased to 80 mg daily. Resume home Synthroid. To resume amiodarone 200 mg daily on admission to CIR. Now on heparin 5000 units 3 times daily for DVT prophylaxis. Advised regarding tobacco and alcohol use. Right hemiparesis persists. Tolerating heart healthy diet with thin liquids. The patient requires inpatient medicine and rehabilitation evaluations and services for ongoing dysfunction secondary to ICH with IVH extension.   Medical History:   Past Medical History:  Diagnosis Date   AF (paroxysmal atrial fibrillation) (HCC) 11/29/2018   Atrial flutter (HCC) 10/12/2015   a. TEE 3/17 with ? LAA clot-->s/p TEE/DCCV 11/24/2015; s/p DCCV 12/2022;s/p ablation 06/2023   CAD (coronary artery disease), native coronary artery 12/06/2015   a. 10/2015 MV: EF  37%, reversible defect inferior apex, intermediate risk findings; b. 10/2015 Cath: 20% mid RCA.   Carotid artery stenosis    1-39% right ICA stenosis and occluded left ICA   Colonic diverticular abscess    Diverticulitis    History of chemotherapy 2005   Cisplatin   Hypothyroidism    NICM (nonischemic cardiomyopathy) (HCC) 10/14/2015   a. Tachy mediated?;  b. Echo 3/17 - Mild concentric LVH, EF 30-35%, anteroseptal, anterior, anterolateral, apical anterior, lateral hypokinesis, trivial MR, mild to moderately reduced RVSF; c. LHC 3/17 - mRCA 20%   Radiation NOv.3,2005-Dec. 15, 2005   6810 cGy in 30 fractions   Tonsil cancer [facility]) 2005   Dr Otila Kluver.  XRT         Patient Active Problem List   Diagnosis Date Noted   ICH (intracerebral hemorrhage) (HCC) 08/11/2023   Carotid artery stenosis    Atrial flutter (HCC) 12/19/2022   Essential hypertension 04/22/2021   Hypokalemia 03/12/2019   Expressive aphasia 03/10/2019    Dysphagia 03/10/2019   Middle cerebral artery embolism, left 03/09/2019   Diverticular stricture (HCC) 03/07/2019   Bacteria in urine    Chronic atrial fibrillation (HCC) 12/02/2018   Current use of long term anticoagulation 12/02/2018   UTI (urinary tract infection) 11/29/2018   Hyponatremia 11/29/2018   PAF (paroxysmal atrial fibrillation) (HCC) 11/29/2018   Diverticulitis of large intestine with abscess 09/23/2018   Dyslipidemia 12/05/2017   CAD (coronary artery disease), native coronary artery 12/06/2015   SOB (shortness of breath)    NICM (nonischemic cardiomyopathy) (HCC)    Thyroid activity decreased    Hypothyroidism 08/12/2013   History of radiation therapy 05/30/2012   Smokes tobacco daily 05/30/2012   Cancer of tonsillar fossa (HCC) 11/27/2011   Tonsil cancer (HCC) 2005   History of chemotherapy 2005    Behavioral Observation/Mental Status:   Adrian [name]  presents as a 68 y.o.-year-old Right handed Caucasian Male who appeared his stated age. his dress was Appropriate and he was Well Groomed and his manners were Appropriate to the situation.  his participation was indicative of Appropriate and Attentive behaviors.  There were physical disabilities noted.  he displayed an appropriate level of cooperation and motivation.    Interactions:    Active Appropriate  Attention:   within normal limits and attention span and concentration were age  appropriate  Memory:   within normal limits; recent and remote memory intact  Visuo-spatial:   not examined  Speech (Volume):  normal  Speech:   normal; slurred  Thought Process:  Coherent and Relevant  Coherent  Though Content:  WNL; not suicidal and not homicidal  Orientation:   person, place, time/date, and situation  Judgment:   Good  Planning:   Fair  Affect:    Appropriate  Mood:    Dysphoric  Insight:   Good  Intelligence:   normal  Psychiatric History:  No prior psychiatric history  History of Substance Use  or Abuse:  There is a documented history of tobacco abuse confirmed by the patient.    Family Med/Psych History:  Family History  Problem Relation Age of Onset   Prostate cancer Father    Colon cancer Father    Skin cancer Mother    Cancer Brother    Esophageal cancer Neg Hx    Rectal cancer Neg Hx    Stomach cancer Neg Hx     Impression/DX:   Adrian [name] is a 68 year old male referred for neuropsychological consultation due to coping and adjustment issues after nontraumatic subcortical hemorrhage.  Patient is currently admitted onto the comprehensive inpatient rehabilitation unit.  Patient had presented to [facility] emergency department on 08/11/2023 given good detail to events just prior.  Patient noted that he woke up in the morning in his usual state of health.  Patient noted that approximately 11 AM he noted abrupt onset of right-sided weakness.  Patient was transported to the emergency department via code stroke.  Patient was hypertensive with systolic blood pressure to 230 per EMS.  CT scan revealed left basal ganglia intracranial hemorrhage with intraventricular extension.  Close control of her blood pressure was initiated and patient was admitted to ICU for strict blood pressure control.  Past medical history has included A-fib on Eliquis, carotid artery stenosis, hypothyroidism and previous ablation.  Chronic left ICA occlusion is noted along with tobacco use, hyperlipidemia, chronic leukopenia.  Physical exam had noted right-sided facial droop, right upper and lower extremity weakness and dysarthria.  Once patient was stabilized medically and therapy evaluations were completed patient was admitted to CIR due to ongoing dysfunction secondary to his left basal ganglia region intracranial hemorrhage.  During today's visit, the patient was oriented and appeared to be at baseline cognitively.  Patient had lots of questions as to why he may have had such high blood pressure noting that he had  not had this situation before.  Patient noted right-sided motor deficits but also noted that he has improved considerably over the past week.  Patient with weak motor strength and weak fine motor control for right hand and self-reports issues with right leg.  Very mild facial droop may have been visible today.  Patient notes that he is concerned about how he will return and improve over time and we addressed this issue with what to expect going forward.  Today we worked on coping and adjustment issues with residual effects of his subcortical hemorrhage.  Disposition/Plan:  Today we worked on coping and adjustment issues with recent left basal ganglia ICH patient improving physical status.          Electronically Signed   _______________________ Arley Phenix, Psy.D. Clinical Neuropsychologist

## 2023-09-04 NOTE — Progress Notes (Signed)
Speech Language Pathology Discharge Summary  Patient Details  Name: Adrian [name] MRN: [id number] Date of Birth: [birth year]  Date of Discharge from SLP service:September 04, 2023  Today's Date: 09/04/2023 SLP Individual Time: 1100-1159 SLP Individual Time Calculation (min): 59 min   Skilled Therapeutic Interventions:   Skilled therapy session focused on re-evaluation of patients cognitive-linguistic abilities and education. SLP re-administered the Cognistat and patient scored WFL on all subtests indiciating improvements from evaluation (initial deficits in memory/calculations). Although patient with functional improvements, he continues to demonstrate word finding difficulty during conversation, however is able to utilize strategies to repair breakdowns with minA. SLP reviewed all strategies taught in therapy including memory strategies, swallowing and word finding. Patient independently recalled memory and swallowing strategies, however required minA to name specific word finding strategies targeted in therapy. SLP re-educate patient on current diet recommendations and reason pills should be crushed in puree. Patient verbalized understanding with no further questions. Patient left in bed with alarm set and call bell in reach.     Patient has met 4 of 5 long term goals.  Patient to discharge at overall Supervision;Min;Modified Independent level.  Reasons goals not met: continues to require minA to utilize word finding strategies in conversation   Clinical Impression/Discharge Summary:  Pt has made great gains and has met 4 of 5 LTG's this admission due to improved dysphagia, cognition and communication. Pt is currently an overall supervisionA for cognitive tasks and requires modi cues for utilization of swallowing compensatory strategies to minimize overt s/sx of aspiration with reg/thin diet. Patient continues to require minA to utilize word finding strategies at the conversational level to repair  communication breakdowns. Pt/family education complete and pt will discharge home with hour supervision from friends/family/etc. Pt would benefit from f/u ST services to maximize communication in order to maximize functional independence. Patient may benefit from OP MBS to reassess pharyngeal residuals.    Care Partner:  Caregiver Able to Provide Assistance: Yes  Type of Caregiver Assistance: Cognitive;Physical  Recommendation:  Outpatient SLP;Home Health SLP  Rationale for SLP Follow Up: Maximize cognitive function and independence;Maximize swallowing safety   Equipment: n/a   Reasons for discharge: Discharged from hospital   Patient/Family Agrees with Progress Made and Goals Achieved: Yes    [name] M.A., CF-SLP 09/04/2023, 12:27 PM

## 2023-09-04 NOTE — Telephone Encounter (Signed)
Call to spouse Okey Regal [facility]) regarding rescheduling Vascular Consult with Dr. Chestine Spore. No answer, explained that patient should reschedule appointment with Dr. Chestine Spore so it can be completed once he is discharged from hospital/rehab. Asked Okey Regal or patient to call us if any questions.

## 2023-09-04 NOTE — Progress Notes (Signed)
Occupational Therapy Discharge Summary  Patient Details  Name: PHILBERT OCALLAGHAN MRN: [id number] Date of Birth: 06-21-1956  Date of Discharge from OT service:September 04, 2023   Patient has met {NUMBERS 0-12:18577} of {NUMBERS 0-12:18577} long term goals due to {due to:3041651}.  Patient to discharge at overall {LOA:3049010} level.  Patient's care partner {care partner:[id number]} to provide the necessary {assistance:3041652} assistance at discharge.    Reasons goals not met: ***  Recommendation:  Patient will benefit from ongoing skilled OT services in outpatient setting to continue to advance functional skills in the area of BADL and iADL.  Equipment: RW, tub transfer bench  Reasons for discharge: treatment goals met and discharge from hospital  Patient/family agrees with progress made and goals achieved: Yes  OT Discharge Precautions/Restrictions    General OT Amount of Missed Time: 9 Minutes Vital Signs   Pain   ADL ADL Eating: Independent Where Assessed-Eating: Chair Grooming: Independent Where Assessed-Grooming: Sitting at sink Upper Body Bathing: Independent Where Assessed-Upper Body Bathing: Shower Lower Body Bathing: Modified independent Where Assessed-Lower Body Bathing: Shower Upper Body Dressing: Modified independent (Device) Where Assessed-Upper Body Dressing: Sitting at sink Lower Body Dressing: Modified independent Where Assessed-Lower Body Dressing: Sitting at sink Toileting: Modified independent Where Assessed-Toileting: Teacher, adult education: Engineer, agricultural Method: Event organiser: Distant supervision Film/video editor Method: Probation officer Overall Cognitive Status: Within Functional Limits for tasks assessed Arousal/Alertness: Awake/alert Orientation Level: Person;Place;Situation Person: Oriented Place: Oriented Situation: Oriented Memory: Appears  intact Awareness: Appears intact Problem Solving: Appears intact Safety/Judgment: Appears intact Brief Interview for Mental Status (BIMS) Repetition of Three Words (First Attempt): 3 Temporal Orientation: Year: Missed by 1 year Temporal Orientation: Month: Accurate within 5 days Temporal Orientation: Day: Correct Recall: "Sock": Yes, no cue required Recall: "Blue": Yes, no cue required Recall: "Bed": Yes, no cue required BIMS Summary Score: 14 Sensation Coordination 9 Hole Peg Test: L: 25.6 R: 54.5 seconds Motor    Mobility     Trunk/Postural Assessment     Balance Balance Balance Assessed: Yes Standardized Balance Assessment Standardized Balance Assessment: Timed Up and Go Test;Berg Balance Test (5xSTS = 13.98 sec) Berg Balance Test Sit to Stand: Able to stand without using hands and stabilize independently Standing Unsupported: Able to stand 2 minutes with supervision Sitting with Back Unsupported but Feet Supported on Floor or Stool: Able to sit safely and securely 2 minutes Stand to Sit: Sits safely with minimal use of hands Transfers: Able to transfer safely, definite need of hands Standing Unsupported with Eyes Closed: Able to stand 10 seconds with supervision Standing Ubsupported with Feet Together: Able to place feet together independently but unable to hold for 30 seconds From Standing, Reach Forward with Outstretched Arm: Can reach forward >12 cm safely (5") From Standing Position, Pick up Object from Floor: Able to pick up shoe, needs supervision From Standing Position, Turn to Look Behind Over each Shoulder: Looks behind one side only/other side shows less weight shift Turn 360 Degrees: Able to turn 360 degrees safely but slowly Standing Unsupported, Alternately Place Feet on Step/Stool: Able to complete >2 steps/needs minimal assist Standing Unsupported, One Foot in Front: Able to plae foot ahead of the other independently and hold 30 seconds Standing on One Leg:  Able to lift leg independently and hold equal to or more than 3 seconds Total Score: 40 Timed Up and Go Test TUG: Normal TUG Normal TUG (seconds): 23.3 Static Sitting  Balance Static Sitting - Balance Support: Feet supported Static Sitting - Level of Assistance: 7: Independent Dynamic Sitting Balance Dynamic Sitting - Balance Support: Feet supported Dynamic Sitting - Level of Assistance: 7: Independent Static Standing Balance Static Standing - Balance Support: No upper extremity supported;During functional activity Static Standing - Level of Assistance: 5: Stand by assistance Dynamic Standing Balance Dynamic Standing - Balance Support: During functional activity;Bilateral upper extremity supported Dynamic Standing - Level of Assistance: 5: Stand by assistance Extremity/Trunk Assessment       Crissie Reese 09/04/2023, 10:47 AM

## 2023-09-04 NOTE — Progress Notes (Signed)
Occupational Therapy Session Note  Patient Details  Name: Adrian [name] MRN: [id number] Date of Birth: 12-Jan-1956  Today's Date: 09/04/2023 OT Individual Time: 0822-0858 OT Individual Time Calculation (min): 36 min    Short Term Goals: Week 3:  OT Short Term Goal 1 (Week 3): STGS = LTGs  Skilled Therapeutic Interventions/Progress Updates:    Pt received supine with no c/o pain, agreeable to OT session.  He came to EOB with mod I and donned socks and B shoes with R AFO with mod I. He completed functional mobility into the bathroom with the RW at mod I level. He completed toileting tasks at mod I level with continent urine void. 150 ft of functional mobility to the therapy gym with distant (S). He completed UE testing- 9 hole, dynamometer and lateral pinch- results below with comparison to previous testing on the 13th. He returned to his room following. Pt was left sitting up in the w/c with all needs met and call bell within reach.     1/13 testing results 9 hole peg test: Left: 28.5", Right: 1'48" Grip strength: Left: 75#, Right: 55# Lateral pinch: Left: 17#, Right: 14#   1/28 testing results 9 hole peg test: Left: 25.6", Right: 54.5 Grip strength: Left: 78#, Right: 60# Lateral pinch: Left: 17#, Right: 15#  Therapy Documentation Precautions:  Precautions Precautions: Fall Precaution Comments: R hemi, mild ataxia Restrictions Weight Bearing Restrictions Per Provider Order: No   Therapy/Group: Individual Therapy  Crissie Reese 09/04/2023, 8:47 AM

## 2023-09-05 ENCOUNTER — Other Ambulatory Visit (HOSPITAL_COMMUNITY): Payer: Self-pay

## 2023-09-05 MED ORDER — ATORVASTATIN CALCIUM 80 MG PO TABS
80.0000 mg | ORAL_TABLET | Freq: Every day | ORAL | 0 refills | Status: DC
  Filled 2023-09-05: qty 30, 30d supply, fill #0

## 2023-09-05 MED ORDER — AMIODARONE HCL 200 MG PO TABS
200.0000 mg | ORAL_TABLET | Freq: Every day | ORAL | 0 refills | Status: DC
  Filled 2023-09-05: qty 30, 30d supply, fill #0

## 2023-09-05 MED ORDER — AMLODIPINE BESYLATE 2.5 MG PO TABS
2.5000 mg | ORAL_TABLET | Freq: Every day | ORAL | 0 refills | Status: DC
  Filled 2023-09-05: qty 30, 30d supply, fill #0

## 2023-09-05 MED ORDER — SENNOSIDES-DOCUSATE SODIUM 8.6-50 MG PO TABS
1.0000 | ORAL_TABLET | Freq: Two times a day (BID) | ORAL | 0 refills | Status: DC
  Filled 2023-09-05: qty 100, 50d supply, fill #0

## 2023-09-05 MED ORDER — ACETAMINOPHEN 325 MG PO TABS: 325.0000 mg | ORAL_TABLET | ORAL | Status: DC | PRN

## 2023-09-05 NOTE — Progress Notes (Signed)
PROGRESS NOTE   Subjective/Complaints:  Appreciate neuropsych note , discussed d/c recs, no driving , no ETOH , no nicotine   ROS: as per HPI. Denies CP, SOB, abd pain, N/V/D/C, or any other complaints at this time.    Objective:   No results found.    Recent Labs    09/03/23 0552  WBC 2.6*  HGB 14.4  HCT 39.5  PLT 152      Recent Labs    09/03/23 0552  NA 136  K 4.4  CL 99  CO2 28  GLUCOSE 92  BUN 19  CREATININE 1.02  CALCIUM 9.2       Intake/Output Summary (Last 24 hours) at 09/05/2023 0750 Last data filed at 09/04/2023 2059 Gross per 24 hour  Intake 720 ml  Output 450 ml  Net 270 ml        Physical Exam: Vital Signs Blood pressure (!) 117/56, pulse 65, temperature 98.5 F (36.9 C), resp. rate 16, height 5\' 8"  (1.727 m), weight 59.4 kg, SpO2 100%.   General: No acute distress, resting in bed. Easily awakens. Mood and affect are appropriate Heart: Regular rate and rhythm no rubs murmurs or extra sounds Lungs: Clear to auscultation, breathing unlabored, no rales or wheezes Abdomen: Positive bowel sounds, soft nontender to palpation, nondistended Extremities: No clubbing, cyanosis, or edema Skin: No evidence of breakdown, no evidence of rash over exposed surfaces.   Neurologic: Cranial nerves II through XII intact, Musculoskeletal: Full range of motion in all 4 extremities. No joint swelling Cerebellar- moderate dysmetria R FNF Skin: No evidence of breakdown, no evidence of rash over exposed surfaces    Neurologic: Cranial nerves--with right central VII, tongue deviation, motor strength is 5/5 in left and 4- R deltoid, bicep, tricep, grip,5/5 left and 4- right  hip flexor, knee extensors,  1/5 on right ADF/PF.  5/5 left ankle dorsiflexor and plantar flexor  PRIOR Reduced pinprick RUE and RLE as well as left foot Proprioception  Reduced RUE with sensory ataxia  Intact finger to thumb  opposition but slow on Right side - Musculoskeletal: Full range of motion in all 4 extremities. No joint swelling   Assessment/Plan: 1. Functional deficits due to   Care Tool:  Bathing    Body parts bathed by patient: Right arm, Chest, Abdomen, Front perineal area, Right upper leg, Buttocks, Left upper leg, Right lower leg, Left lower leg, Face, Left arm   Body parts bathed by helper: Left arm     Bathing assist Assist Level: Independent with assistive device     Upper Body Dressing/Undressing Upper body dressing   What is the patient wearing?: Pull over shirt    Upper body assist Assist Level: Independent    Lower Body Dressing/Undressing Lower body dressing      What is the patient wearing?: Pants, Underwear/pull up     Lower body assist Assist for lower body dressing: Independent with assitive device     Toileting Toileting    Toileting assist Assist for toileting: Independent with assistive device     Transfers Chair/bed transfer  Transfers assist     Chair/bed transfer assist level: Independent with assistive device  Locomotion Ambulation   Ambulation assist      Assist level: Independent with assistive device Assistive device: Walker-rolling Max distance: 300 ft   Walk 10 feet activity   Assist     Assist level: Independent with assistive device Assistive device: Walker-rolling   Walk 50 feet activity   Assist Walk 50 feet with 2 turns activity did not occur: Safety/medical concerns (fatigue)  Assist level: Independent with assistive device Assistive device: Walker-rolling    Walk 150 feet activity   Assist Walk 150 feet activity did not occur: Safety/medical concerns (fatigue)  Assist level: Supervision/Verbal cueing Assistive device: Walker-rolling    Walk 10 feet on uneven surface  activity   Assist Walk 10 feet on uneven surfaces activity did not occur: Safety/medical concerns   Assist level:  Supervision/Verbal cueing Assistive device: Walker-rolling   Wheelchair     Assist Is the patient using a wheelchair?: No (refused as pt will not have w/c on d/c) Type of Wheelchair: Manual Wheelchair activity did not occur: Refused  Wheelchair assist level: Supervision/Verbal cueing Max wheelchair distance: 50 ft    Wheelchair 50 feet with 2 turns activity    Assist    Wheelchair 50 feet with 2 turns activity did not occur: Refused   Assist Level: Supervision/Verbal cueing   Wheelchair 150 feet activity     Assist  Wheelchair 150 feet activity did not occur: Refused   Assist Level: Moderate Assistance - Patient 50 - 74%   Blood pressure (!) 117/56, pulse 65, temperature 98.5 F (36.9 C), resp. rate 16, height 5\' 8"  (1.727 m), weight 59.4 kg, SpO2 100%.  Medical Problem List and Plan: 1. Functional deficits secondary to left basal ganglia hemorrhage 08/11/2023 d/t hypertension ( BP elevation likely multifactorial ETOH, nicotine) and anticoagulation  with subsequent right hemiparesis             -patient may  shower -ELOS/Goals: 09/05/23, home with family assist   2.  Antithrombotics: -DVT/anticoagulation:  Pharmaceutical: Heparin d/ced amb distance >100'             -antiplatelet therapy: none >2wks post ICH , Dr Roda Shutters , neuro rec repeat CT head - this was reviewed by Dr Roda Shutters as well as Pearlean [name].  Neuro recommends start Eliquis and neuro f/u as OP 3. Pain Management: Tylenol and robaxin as needed   4. Mood/Behavior/Sleep: LCSW to evaluate and provide emotional support             -antipsychotic agents: n/a Chart lists ETOH abuse but on admit ethanol level <10 ,pt admits to drinking "a bit"  discussed rec for no ETOH given hx and balance issues    5. Neuropsych/cognition: This patient is capable of making decisions on his own behalf although he has mild exp aphasia    6. Skin/Wound Care: Routine skin care checks   7. Fluids/Electrolytes/Nutrition: Routine Is and Os and  follow-up chemistries      Latest Ref Rng & Units 09/03/2023    5:52 AM 08/27/2023    4:50 AM 08/20/2023    5:11 AM  BMP  Glucose 70 - 99 mg/dL 92  97  95   BUN 8 - 23 mg/dL 19  20  24    Creatinine 0.61 - 1.24 mg/dL 3.24  4.01  0.27   Sodium 135 - 145 mmol/L 136  132  129   Potassium 3.5 - 5.1 mmol/L 4.4  4.2  4.2   Chloride 98 - 111 mmol/L 99  97  97  CO2 22 - 32 mmol/L 28  25  24    Calcium 8.9 - 10.3 mg/dL 9.2  9.2  9.2   4/74 wnl  8: Hypertension/bradycardia: monitor TID and prn. Fair control at present             -Continue amlodipine 2.5 mg daily -Continue metoprolol tartrate  decreased to 12.5mg  BID on 08/20/23-- d/c'd Controlled 1/22- elevated this am on metoprolol , may need to increase amlodipine in am if this persists -1/25-26/25 BP/HR fine, cont regimen Vitals:   09/01/23 1954 09/02/23 0453 09/02/23 1551 09/02/23 2002  BP: 125/79 132/80 137/83 126/70   09/03/23 0503 09/03/23 1341 09/03/23 1536 09/03/23 2014  BP: 135/77 133/73 138/88 136/83   09/04/23 0550 09/04/23 1354 09/04/23 1926 09/05/23 0516  BP: (!) 141/74 121/73 122/76 (!) 117/56    9: Hyperlipidemia: continue Lipitor 80 mg daily   10: Hypothyroidism: Continue Synthroid 125 mcg every morning   11: Tobacco use: Cessation counseling, pt states he has no desire to smoke or craving    12: Atrial fibrillation: Now on Eliquis 5mg  BID  Reached out to neurology to consider ASPIRE trial, may be done at OP visit. Continue amiodarone 200mg  daily  -rate is controlled   13: Chronic leukopenia: Follow-up CBC- stable WBCs    14: Mild thrombocytopenia: Follow-up CBC   15.  Sensory def due to CVA. Left foot  sensation may be related to neuropathy , not a diabetic but has hx of ETOH as well as chemo therapy ~75yrs ago with cisplatin   LOS: 22 days A FACE TO FACE EVALUATION WAS PERFORMED  Erick Colace 09/05/2023, 7:50 AM

## 2023-09-05 NOTE — Plan of Care (Signed)
Problem: RH Balance Goal: LTG Patient will maintain dynamic sitting balance (PT) Description: LTG:  Patient will maintain dynamic sitting balance with assistance during mobility activities (PT) Outcome: Completed/Met Flowsheets (Taken 09/05/2023 0520) LTG: Pt will maintain dynamic sitting balance during mobility activities with:: Independent Goal: LTG Patient will maintain dynamic standing balance (PT) Description: LTG:  Patient will maintain dynamic standing balance with assistance during mobility activities (PT) Outcome: Completed/Met Flowsheets (Taken 09/05/2023 0520) LTG: Pt will maintain dynamic standing balance during mobility activities with:: Independent with assistive device    Problem: Sit to Stand Goal: LTG:  Patient will perform sit to stand with assistance level (PT) Description: LTG:  Patient will perform sit to stand with assistance level (PT) Outcome: Completed/Met Flowsheets (Taken 09/05/2023 0520) LTG: PT will perform sit to stand in preparation for functional mobility with assistance level: Independent   Problem: RH Bed Mobility Goal: LTG Patient will perform bed mobility with assist (PT) Description: LTG: Patient will perform bed mobility with assistance, with/without cues (PT). Outcome: Completed/Met Flowsheets (Taken 09/05/2023 0520) LTG: Pt will perform bed mobility with assistance level of: Independent   Problem: RH Bed to Chair Transfers Goal: LTG Patient will perform bed/chair transfers w/assist (PT) Description: LTG: Patient will perform bed to chair transfers with assistance (PT). Outcome: Completed/Met Flowsheets (Taken 08/15/2023 1324 by Ambrose Finland, PT) LTG: Pt will perform Bed to Chair Transfers with assistance level: Independent with assistive device    Problem: RH Car Transfers Goal: LTG Patient will perform car transfers with assist (PT) Description: LTG: Patient will perform car transfers with assistance (PT). Outcome: Completed/Met Flowsheets  (Taken 08/15/2023 1324 by Ambrose Finland, PT) LTG: Pt will perform car transfers with assist:: Supervision/Verbal cueing   Problem: RH Furniture Transfers Goal: LTG Patient will perform furniture transfers w/assist (OT/PT) Description: LTG: Patient will perform furniture transfers  with assistance (OT/PT). Outcome: Completed/Met Flowsheets (Taken 08/15/2023 1324 by Ambrose Finland, PT) LTG: Pt will perform furniture transfers with assist:: Independent with assistive device    Problem: RH Ambulation Goal: LTG Patient will ambulate in controlled environment (PT) Description: LTG: Patient will ambulate in a controlled environment, # of feet with assistance (PT). Outcome: Completed/Met Flowsheets Taken 09/05/2023 0520 by Loel Dubonnet, PT LTG: Ambulation distance in controlled environment: >300 ft using LRAD Taken 08/15/2023 1324 by Ambrose Finland, PT LTG: Pt will ambulate in controlled environ  assist needed:: Supervision/Verbal cueing Goal: LTG Patient will ambulate in home environment (PT) Description: LTG: Patient will ambulate in home environment, # of feet with assistance (PT). Outcome: Completed/Met Flowsheets (Taken 09/05/2023 0520) LTG: Pt will ambulate in home environ  assist needed:: Independent with assistive device   Problem: RH Wheelchair Mobility Goal: LTG Patient will propel w/c in controlled environment (PT) Description: LTG: Patient will propel wheelchair in controlled environment, # of feet with assist (PT) Outcome: Completed/Met Flowsheets (Taken 08/15/2023 1324 by Ambrose Finland, PT) LTG: Pt will propel w/c in controlled environ  assist needed:: Independent with assistive device LTG: Propel w/c distance in controlled environment: 150 Goal: LTG Patient will propel w/c in home environment (PT) Description: LTG: Patient will propel wheelchair in home environment, # of feet with assistance (PT). Outcome: Completed/Met Flowsheets (Taken 08/15/2023 1324 by Ambrose Finland,  PT) LTG: Pt will propel w/c in home environ  assist needed:: Independent with assistive device LTG: Propel w/c distance in home environment: 75   Problem: RH Stairs Goal: LTG Patient will ambulate up and down stairs w/assist (PT) Description: LTG: Patient will ambulate up and down #  of stairs with assistance (PT) Outcome: Completed/Met Flowsheets (Taken 09/05/2023 0520) LTG: Pt will ambulate up/down stairs assist needed:: Supervision/Verbal cueing

## 2023-09-05 NOTE — Plan of Care (Signed)
  Problem: Sit to Stand Goal: LTG:  Patient will perform sit to stand in prep for activites of daily living with assistance level (OT) Description: LTG:  Patient will perform sit to stand in prep for activites of daily living with assistance level (OT) Outcome: Completed/Met   Problem: RH Eating Goal: LTG Patient will perform eating w/assist, cues/equip (OT) Description: LTG: Patient will perform eating with assist, with/without cues using equipment (OT) Outcome: Completed/Met   Problem: RH Grooming Goal: LTG Patient will perform grooming w/assist,cues/equip (OT) Description: LTG: Patient will perform grooming with assist, with/without cues using equipment (OT) Outcome: Completed/Met   Problem: RH Bathing Goal: LTG Patient will bathe all body parts with assist levels (OT) Description: LTG: Patient will bathe all body parts with assist levels (OT) Outcome: Completed/Met   Problem: RH Dressing Goal: LTG Patient will perform upper body dressing (OT) Description: LTG Patient will perform upper body dressing with assist, with/without cues (OT). Outcome: Completed/Met   Problem: RH Toileting Goal: LTG Patient will perform toileting task (3/3 steps) with assistance level (OT) Description: LTG: Patient will perform toileting task (3/3 steps) with assistance level (OT)  Outcome: Completed/Met   Problem: RH Functional Use of Upper Extremity Goal: LTG Patient will use RT/LT upper extremity as a (OT) Description: LTG: Patient will use right/left upper extremity as a stabilizer/gross assist/diminished/nondominant/dominant level with assist, with/without cues during functional activity (OT) Outcome: Completed/Met   Problem: RH Toilet Transfers Goal: LTG Patient will perform toilet transfers w/assist (OT) Description: LTG: Patient will perform toilet transfers with assist, with/without cues using equipment (OT) Outcome: Completed/Met   Problem: RH Tub/Shower Transfers Goal: LTG Patient will  perform tub/shower transfers w/assist (OT) Description: LTG: Patient will perform tub/shower transfers with assist, with/without cues using equipment (OT) Outcome: Completed/Met   Problem: RH Dressing Goal: LTG Patient will perform lower body dressing w/assist (OT) Description: LTG: Patient will perform lower body dressing with assist, with/without cues in positioning using equipment (OT) Outcome: Completed/Met   Problem: RH Laundry Goal: LTG Patient will perform laundry w/assist, cues (OT) Description: LTG: Patient will perform laundry with assistance, with/without cues (OT). Outcome: Completed/Met   Problem: RH Light Housekeeping Goal: LTG Patient will perform light housekeeping w/assist (OT) Description: LTG: Patient will perform light housekeeping with assistance, with/without cues (OT). Outcome: Completed/Met

## 2023-09-05 NOTE — Progress Notes (Signed)
Patient ID: Adrian [name], male   DOB: [birth year], 68 y.o.   MRN: [id number]  SW received updates from Angie/[facility] and reports waiting to hear from billing department on insurance.  *SW received updates reporting able to accept referral for PT/OT/SLP.  SW rescinded outpatient referral from [facility] Location [phone]).  SW called pt ex-wife [name] to inform on [facility] now in place. SW provided contact number for [facility] via text.   Cecile Sheerer, MSW, LCSW Office: [phone] Cell: [phone] Fax: [phone]

## 2023-09-05 NOTE — Progress Notes (Signed)
Inpatient Rehabilitation Care Coordinator Discharge Note   Patient Details  Name: Adrian [name] MRN: [id number] Date of Birth: Dec 12, 1955   Discharge location: D/c to home  Length of Stay: 21 days  Discharge activity level: ambulatory level Modified Independent  Home/community participation: Limited  Patient response WU:[id number] Literacy - How often do you need to have someone help you when you read instructions, pamphlets, or other written material from your doctor or pharmacy?: Never  Patient response YN:WGNFAO Isolation - How often do you feel lonely or isolated from those around you?: Never  Services provided included: MD, RD, PT, OT, SLP, RN, CM, TR, Pharmacy, Neuropsych, SW  Financial Services:  Field seismologist Utilized: Medicare    Choices offered to/list presented to: patient and pt wife  Follow-up services arranged:  Home Health, Outpatient Home Health Agency: [facility] for HHPT/OT/SLP/aide  Outpatient Servicies: [facility] for RW and TTB      Patient response to transportation need: Is the patient able to respond to transportation needs?: Yes In the past 12 months, has lack of transportation kept you from medical appointments or from getting medications?: No In the past 12 months, has lack of transportation kept you from meetings, work, or from getting things needed for daily living?: No   Patient/Family verbalized understanding of follow-up arrangements:  Yes  Individual responsible for coordination of the follow-up plan: contact pt ex-wife [name] [phone]  Confirmed correct DME delivered: Gretchen Short 09/05/2023    Comments (or additional information):fam edu completed  Summary of Stay    Date/Time Discharge Planning CSW  08/30/23 1107 Pt will discharge to home with ex-wife. Fam edu scheduled for Friday (1/24) 1pm-4pm. SW will confirm there are no barriers to discharge. AAC  08/29/23 0702 meet goals DLR  08/27/23 1339 Pt will discharge  to home with ex-wife. Fam edu scheduled for Friday (1/24) 1pm-4pm. SW will confirm there are no barriers to discharge. AAC  08/20/23 1539 Pt will discharge to home with ex-wife. SW will confirm there are no barriers to discharge. AAC  08/15/23 0957 TBA. Per EMR, pt to discharge to home with ex-wife. SW will confirm there are no barriers to discharge. AAC       Travia Onstad A Lula Olszewski

## 2023-09-07 ENCOUNTER — Telehealth: Payer: Self-pay

## 2023-09-07 NOTE — Telephone Encounter (Signed)
Copied from CRM [id number]. Topic: Clinical - Home Health Verbal Orders >> Sep 07, 2023  1:25 PM [place] D wrote: Caller/Agency: [facility]  Callback Number: [phone] Service Requested: Physical Therapy Frequency: 2 week 3 , 1 week 3  Any new concerns about the patient? No

## 2023-09-07 NOTE — Telephone Encounter (Signed)
Transitional Care Call--who you spoke with wife [name] [phone].   Are you/is patient experiencing any problems since coming home. No problems.  Are there any questions regarding any aspect of care? No.  Are there any questions regarding medications administration/dosing? No. Are meds being taken as prescribed? Yes. However, I was not able to review list. [name] was at work but stated he has his medications. Patient should review meds with caller to confirm. (Rx's where not present).  Have there been any falls? No.  Has Home Health been to the house and/or have they contacted you? Yes.  If not, have you tried to contact them? N/A Can we help you contact them?  No need.  Are bowels and bladder emptying properly? Yes.  Are there any unexpected incontinence issues? None.  If applicable, is patient following bowel/bladder programs? N/A. Any fevers, problems with breathing, unexpected pain? None.   Are there any skin problems or new areas of breakdown? None. Has the patient/family member arranged specialty MD follow up (ie cardiology/neurology/renal/surgical/etc)? [name] the wife has been informed of appointments that need to be set up.  Can we help arrange? N/A Does the patient need any other services or support that we can help arrange?  None. Are caregivers following through as expected in assisting the patient? Yes. [name] & Niece are assisting the patient. [name] was advised to call the office back if she needs any help or has any questions.         11. Has the patient quit smoking, drinking alcohol, or using drugs as recommended? Per [name] she has removed alcohol from the home.  She did state he failed to mention at some point he was a heavy drinker & smoked.   Appointment  [address] with Jacalyn Lefevre NP on 09/18/2023 arrive at  2:40 PM.

## 2023-09-08 ENCOUNTER — Other Ambulatory Visit: Payer: Self-pay | Admitting: Family Medicine

## 2023-09-08 DIAGNOSIS — E039 Hypothyroidism, unspecified: Secondary | ICD-10-CM

## 2023-09-10 ENCOUNTER — Telehealth: Payer: Self-pay

## 2023-09-10 NOTE — Telephone Encounter (Signed)
Patient rescheduled for vascular consult with Dr. Chestine Spore on 10/23/23.

## 2023-09-10 NOTE — Telephone Encounter (Signed)
Left a message for agency to return my call.

## 2023-09-10 NOTE — Telephone Encounter (Signed)
Adrian [name] informed of Ok

## 2023-09-10 NOTE — Telephone Encounter (Signed)
Copied from CRM [id number]. Topic: General - Other >> Sep 10, 2023 12:38 PM Jon G[place] C wrote: Reason for CRM: Genevive from PT called in returning call back. She also wanted to state that patient has refused home health aid as he stated he has family to assist him

## 2023-09-11 ENCOUNTER — Telehealth: Payer: Self-pay | Admitting: Adult Health

## 2023-09-11 ENCOUNTER — Telehealth: Payer: Self-pay | Admitting: Physical Medicine & Rehabilitation

## 2023-09-11 NOTE — Telephone Encounter (Signed)
Pt's wife, Eliasar Hlavaty calling to schedule hospital follow up for stroke on 08/11/23 and discharged from hospital on 09/05/23. Transferring call to New Patient Referrals.

## 2023-09-11 NOTE — Telephone Encounter (Signed)
Called wife and let her know that the therapist will call with the frequency. She understands

## 2023-09-11 NOTE — Telephone Encounter (Signed)
 Patient spouse called in and wants to know how often therapies were ordered for patient . Patient states [facility] is the current agency they are working with and so far has started OT 2/3 ST 2/4 and PT but wife thinks patient would benefit from more therapies and wants to know if it can be increased , they did not inform patient how often it would be but would like at lease twice a week if possible

## 2023-09-12 ENCOUNTER — Encounter: Payer: Self-pay | Admitting: Family Medicine

## 2023-09-12 ENCOUNTER — Ambulatory Visit (INDEPENDENT_AMBULATORY_CARE_PROVIDER_SITE_OTHER): Payer: Medicare Other | Admitting: Family Medicine

## 2023-09-12 VITALS — BP 116/60 | HR 70 | Temp 97.6°F | Wt 139.4 lb

## 2023-09-12 DIAGNOSIS — E785 Hyperlipidemia, unspecified: Secondary | ICD-10-CM

## 2023-09-12 DIAGNOSIS — I610 Nontraumatic intracerebral hemorrhage in hemisphere, subcortical: Secondary | ICD-10-CM | POA: Diagnosis not present

## 2023-09-12 DIAGNOSIS — E039 Hypothyroidism, unspecified: Secondary | ICD-10-CM | POA: Diagnosis not present

## 2023-09-12 DIAGNOSIS — I10 Essential (primary) hypertension: Secondary | ICD-10-CM

## 2023-09-12 DIAGNOSIS — I4892 Unspecified atrial flutter: Secondary | ICD-10-CM

## 2023-09-12 MED ORDER — AMLODIPINE BESYLATE 2.5 MG PO TABS: 2.5000 mg | ORAL_TABLET | Freq: Every day | ORAL | 3 refills | Status: DC

## 2023-09-12 NOTE — Progress Notes (Signed)
 Established Patient Office Visit  Subjective   Patient ID: ERYC BODEY, male    DOB: 1956-04-28  Age: 68 y.o. MRN: [id number]  Chief Complaint  Patient presents with   Hospitalization Follow-up    HPI   [place] is seen for hospital follow-up following recent stroke.  He has chronic problems including history of a flutter, CAD, peripheral vascular disease, hypertension, cancer of the tonsillar fossa, diverticulitis, hypothyroidism, longstanding nicotine  use, and past history of alcohol abuse.  He just retired back in November.  On January 4 he developed sudden onset of right hemiplegia and some aphasia.  He had severe hypertension with initial systolic 230 by EMS.  CT scans showed large basal ganglia intracerebral hemorrhage with intraventricular hemorrhage extension.  Was sent to the neuro ICU and treated with Cleviprex .  Patient was on Eliquis  5 mg twice daily at time of admission.  MRI scan confirmed findings of CT.  CT angiogram head and neck showed no major vascular malformation or aneurysm.  Chronic occlusion proximal left ICA.  Repeat CT head stable size hematoma.  2D echo normal EF with no acute abnormalities.  He had LDL cholesterol 117 and atorvastatin  increased from 40-80.  A1c 5.6%.  Remained in sinus rhythm throughout hospitalization.  Patient had been on Lopressor  50 mg twice daily but had some bradycardia and beta-blocker was discontinued prior to discharge.  Loop recorder was placed.  Maintained on amiodarone .  Blood pressure stabilized after Cleviprex .  He was eventually started on amlodipine  2.5 mg daily.  Blood pressure stable since discharge.  He was transferred to rehab on the seventh and they are through the 29th and has made good progress with PT and OT.  He has home speech therapy, PT, and OT services.  Ambulating with a walker.  Has regained considerable amount of strength in the right upper extremity.  Improving ambulation.  Patient has history of hypothyroidism and  recently has been taking 112 mcg levothyroxine .  We had actually bumped this up to 125 previously but for some reason he went back to the 112.  No recent TSH was drawn.  Patient quit smoking at the time of admission and does not plan to start back.  He also has given up alcohol for now.  Apparently was binge drinking some prior to admission.  Past Medical History:  Diagnosis Date   AF (paroxysmal atrial fibrillation) (HCC) 11/29/2018   Atrial flutter (HCC) 10/12/2015   a. TEE 3/17 with ? LAA clot-->s/p TEE/DCCV 11/24/2015; s/p DCCV 12/2022;s/p ablation 06/2023   CAD (coronary artery disease), native coronary artery 12/06/2015   a. 10/2015 MV: EF  37%, reversible defect inferior apex, intermediate risk findings; b. 10/2015 Cath: 20% mid RCA.   Carotid artery stenosis    1-39% right ICA stenosis and occluded left ICA   Colonic diverticular abscess    Diverticulitis    History of chemotherapy 2005   Cisplatin   Hypothyroidism    NICM (nonischemic cardiomyopathy) (HCC) 10/14/2015   a. Tachy mediated?;  b. Echo 3/17 - Mild concentric LVH, EF 30-35%, anteroseptal, anterior, anterolateral, apical anterior, lateral hypokinesis, trivial MR, mild to moderately reduced RVSF; c. LHC 3/17 - mRCA 20%   Radiation NOv.3,2005-Dec. 15, 2005   6810 cGy in 30 fractions   Tonsil cancer [facility]) 2005   Dr Hughes.  XRT   Past Surgical History:  Procedure Laterality Date   A-FLUTTER ABLATION N/A 06/19/2023   Procedure: A-FLUTTER ABLATION;  Surgeon: Cindie Ole DASEN, MD;  Location: [facility] INVASIVE  CV LAB;  Service: Cardiovascular;  Laterality: N/A;   APPENDECTOMY N/A 03/07/2019   Procedure: ROBOTIC ASSISTED APPENDECTOMY;  Surgeon: Sheldon Standing, MD;  Location: [facility] ORS;  Service: General;  Laterality: N/A;   CARDIAC CATHETERIZATION N/A 10/18/2015   Procedure: Left Heart Cath and Coronary Angiography;  Surgeon: Lonni JONETTA Cash, MD;  Location: [facility] INVASIVE CV LAB;  Service: Cardiovascular;  Laterality: N/A;    CARDIOVERSION N/A 11/24/2015   Procedure: CARDIOVERSION;  Surgeon: Maude JAYSON Emmer, MD;  Location: [facility] ENDOSCOPY;  Service: Cardiovascular;  Laterality: N/A;   CARDIOVERSION N/A 12/20/2022   Procedure: CARDIOVERSION;  Surgeon: Alvan Ronal BRAVO, MD;  Location: [facility] INVASIVE CV LAB;  Service: Cardiovascular;  Laterality: N/A;   CYSTOSCOPY WITH STENT PLACEMENT Bilateral 03/07/2019   Procedure: CYSTOSCOPY WITH BILATERAL FIREFLY INJECTION;  Surgeon: Cam Morene ORN, MD;  Location: [facility] ORS;  Service: Urology;  Laterality: Bilateral;   GASTROSTOMY TUBE PLACEMENT  07/04/2004   IR - G tube for tonsilar cancer   IR ANGIO INTRA EXTRACRAN SEL COM CAROTID INNOMINATE UNI R MOD SED  03/09/2019   IR ANGIO VERTEBRAL SEL VERTEBRAL UNI R MOD SED  03/09/2019   IR CT HEAD LTD  03/09/2019   IR PERCUTANEOUS ART THROMBECTOMY/INFUSION INTRACRANIAL INC DIAG ANGIO  03/09/2019   IR RADIOLOGIST EVAL & MGMT  12/18/2018   LAMINECTOMY     C5/placement of steel plate   LOOP RECORDER INSERTION N/A 06/19/2023   Procedure: LOOP RECORDER INSERTION;  Surgeon: Cindie Ole DASEN, MD;  Location: [facility] INVASIVE CV LAB;  Service: Cardiovascular;  Laterality: N/A;   NECK SURGERY  2003   replaced disk   RADIOLOGY WITH ANESTHESIA N/A 03/08/2019   Procedure: IR WITH ANESTHESIA;  Surgeon: Dolphus Carrion, MD;  Location: [facility] OR;  Service: Radiology;  Laterality: N/A;   TEE WITHOUT CARDIOVERSION N/A 10/15/2015   Procedure: TRANSESOPHAGEAL ECHOCARDIOGRAM (TEE);  Surgeon: Ezra GORMAN Shuck, MD;  Location: [facility] ENDOSCOPY;  Service: Cardiovascular;  Laterality: N/A;   TEE WITHOUT CARDIOVERSION N/A 11/24/2015   Procedure: TRANSESOPHAGEAL ECHOCARDIOGRAM (TEE);  Surgeon: Maude JAYSON Emmer, MD;  Location: [facility] ENDOSCOPY;  Service: Cardiovascular;  Laterality: N/A;   TEE WITHOUT CARDIOVERSION N/A 12/20/2022   Procedure: TRANSESOPHAGEAL ECHOCARDIOGRAM;  Surgeon: Alvan Ronal BRAVO, MD;  Location: [facility] INVASIVE CV LAB;  Service: Cardiovascular;  Laterality: N/A;   XI ROBOTIC ASSISTED  COLOSTOMY TAKEDOWN N/A 03/07/2019   Procedure: XI ROBOTIC ASSISTED LOW ANTERIOR RESECTION, RIGID PROCTOSCOPY;  Surgeon: Sheldon Standing, MD;  Location: [facility]S;  Service: General;  Laterality: N/A;    reports that he has been smoking cigarettes. He has a 10 pack-year smoking history. He has never used smokeless tobacco. He reports current alcohol use of about 12.0 standard drinks of alcohol per week. He reports that he does not use drugs. family history includes Cancer in his brother; Colon cancer in his father; Prostate cancer in his father; Skin cancer in his mother. No Known Allergies  Review of Systems  Constitutional:  Negative for chills and fever.  Eyes:  Negative for blurred vision.  Respiratory:  Negative for shortness of breath.   Cardiovascular:  Negative for chest pain and leg swelling.  Gastrointestinal:  Negative for abdominal pain.  Genitourinary:  Negative for dysuria.  Neurological:  Negative for dizziness, weakness and headaches.      Objective:     BP 116/60 (BP Location: Left Arm, Patient Position: Sitting, Cuff Size: Normal)   Pulse 70   Temp 97.6 F (36.4 C) (Oral)   Wt 139 lb 6.4  oz (63.2 kg)   SpO2 99%   BMI 21.20 kg/m  BP Readings from Last 3 Encounters:  09/12/23 116/60  09/05/23 (!) 117/56  08/14/23 (!) 148/81   Wt Readings from Last 3 Encounters:  09/12/23 139 lb 6.4 oz (63.2 kg)  08/21/23 130 lb 15.3 oz (59.4 kg)  08/11/23 140 lb (63.5 kg)      Physical Exam Vitals reviewed.  Constitutional:      General: He is not in acute distress.    Appearance: He is well-developed. He is not ill-appearing.  Eyes:     Pupils: Pupils are equal, round, and reactive to light.  Neck:     Thyroid : No thyromegaly.  Cardiovascular:     Rate and Rhythm: Normal rate and regular rhythm.  Pulmonary:     Effort: Pulmonary effort is normal. No respiratory distress.     Breath sounds: Normal breath sounds. No wheezing or rales.  Musculoskeletal:     Cervical  back: Neck supple.     Right lower leg: No edema.     Left lower leg: No edema.  Neurological:     Mental Status: He is alert and oriented to person, place, and time.     Comments: Right hand grip just slightly diminished compared with left.      No results found for any visits on 09/12/23.  Last CBC Lab Results  Component Value Date   WBC 2.6 (L) 09/03/2023   HGB 14.4 09/03/2023   HCT 39.5 09/03/2023   MCV 95.0 09/03/2023   MCH 34.6 (H) 09/03/2023   RDW 11.8 09/03/2023   PLT 152 09/03/2023   Last metabolic panel Lab Results  Component Value Date   GLUCOSE 92 09/03/2023   NA 136 09/03/2023   K 4.4 09/03/2023   CL 99 09/03/2023   CO2 28 09/03/2023   BUN 19 09/03/2023   CREATININE 1.02 09/03/2023   GFRNONAA >60 09/03/2023   CALCIUM  9.2 09/03/2023   PHOS 3.1 10/12/2015   PROT 7.2 08/15/2023   ALBUMIN 3.4 (L) 08/15/2023   LABGLOB 3.4 10/08/2020   AGRATIO 1.2 10/08/2020   BILITOT 0.9 08/15/2023   ALKPHOS 66 08/15/2023   AST 18 08/15/2023   ALT 18 08/15/2023   ANIONGAP 9 09/03/2023   Last lipids Lab Results  Component Value Date   CHOL 199 08/12/2023   HDL 56 08/12/2023   LDLCALC 117 (H) 08/12/2023   LDLDIRECT 143.3 06/20/2012   TRIG 128 08/12/2023   CHOLHDL 3.6 08/12/2023   Last hemoglobin A1c Lab Results  Component Value Date   HGBA1C 5.6 08/12/2023   Last thyroid  functions Lab Results  Component Value Date   TSH 1.541 12/19/2022   T4TOTAL 6.3 08/31/2008      The ASCVD Risk score (Arnett DK, et al., 2019) failed to calculate for the following reasons:   Risk score cannot be calculated because patient has a medical history suggesting prior/existing ASCVD    Assessment & Plan:   #1 recent acute intracerebral hemorrhage with right hemiplegia and some aphasia.  Gradually improving.  This occurred in the setting of being on Eliquis  with severe hypertension.  Past history of atrial flutter.  Patient on Eliquis .  Loop recorder placed.  Recent LDL  cholesterol 117 with increase in atorvastatin  to 80 mg.  A1c 5.6%.  -Continue home PT, OT, and speech therapy -Thankfully, he quit smoking recently and has no desire to go back.  Continue to avoid all forms of nicotine  -Continue low-dose amlodipine  2.5 mg with  refills sent -Continue close follow-up with cardiology and neurology -Patient also has follow-up scheduled with apparently vascular surgery  #2 hyperlipidemia.  Recent LDL 117 on atorvastatin  40 mg daily.  Future lab order placed for lipid panel to reassess lipids after recent increase of atorvastatin  to 80 mg daily.  Patient tolerating well.  #3 hypothyroidism.  Patient currently on levothyroxine  112 mcg daily.  He was under replaced at this dosage previously but will recheck labs in a couple of weeks before making any further adjustments.  #4 history of a flutter.  Status post prior ablation.  No evidence for recurrent A-fib/flutter on recent monitoring.  Loop recorder placed.  Continue close follow-up with cardiology.  Patient currently on Eliquis  5 mg twice daily.  Recent discontinuation of Lopressor  secondary to bradycardia in hospital.  Over 45 minutes spent in combination between face to face evaluation and review of recent hospital records.    No follow-ups on file.    Wolm Scarlet, MD

## 2023-09-12 NOTE — Telephone Encounter (Signed)
 Noted.

## 2023-09-12 NOTE — Telephone Encounter (Signed)
Patient has upcoming appt today. Patient's wife inquired if TSH labs can be placed at appointment time?

## 2023-09-17 ENCOUNTER — Telehealth: Payer: Self-pay

## 2023-09-17 ENCOUNTER — Encounter: Payer: Medicare Other | Admitting: Registered Nurse

## 2023-09-17 NOTE — Telephone Encounter (Signed)
 Copied from CRM [id number]. Topic: Clinical - Home Health Verbal Orders >> Sep 17, 2023  8:50 AM Aline Ireland wrote: Caller/Agency: Brain Cahill Therapist [facility]  Callback Number: [phone] Service Requested: Speech Therapy Frequency: 1 week 4  Any new concerns about the patient? No

## 2023-09-17 NOTE — Telephone Encounter (Signed)
 Adrian [name] is returning Adrian [name] call

## 2023-09-17 NOTE — Telephone Encounter (Signed)
 Adrian [name] informed of verbal OK

## 2023-09-18 ENCOUNTER — Encounter: Payer: Medicare Other | Admitting: Registered Nurse

## 2023-09-19 ENCOUNTER — Encounter: Payer: Medicare Other | Attending: Registered Nurse | Admitting: Registered Nurse

## 2023-09-19 VITALS — BP 143/80 | HR 63 | Ht 68.0 in | Wt 145.0 lb

## 2023-09-19 DIAGNOSIS — I639 Cerebral infarction, unspecified: Secondary | ICD-10-CM | POA: Diagnosis not present

## 2023-09-19 DIAGNOSIS — I480 Paroxysmal atrial fibrillation: Secondary | ICD-10-CM | POA: Insufficient documentation

## 2023-09-19 DIAGNOSIS — I10 Essential (primary) hypertension: Secondary | ICD-10-CM | POA: Insufficient documentation

## 2023-09-19 NOTE — Progress Notes (Signed)
Subjective:    Patient ID: Adrian [name], male    DOB: 1956-04-10, 68 y.o.   MRN: [id number]  HPI: Adrian [name] is a 68 y.o. male who is scheduled for Transitional Care Visit for follow up of his  ICH ( Intracerebral hemorrhage), Left Basal Ganglia Embolic Stroke, PAF ( Paroxysmal Atrial Fibrillation and Essential Hypertension. He presented to [facility] on 08/11/2023 via EMS with complaints of right sided weakness.  Dr. Derry Lory H&P Note:  Adrian [name] is a 68 y.o. male with hx of Afibb on eliquis and last dose was yesterday evening, carotid artery stenosis, hypothyroidism, who presents with sudden onset right-sided weakness.  He woke up this morning and made his coffee 11 AM, he noted abrupt onset of right-sided weakness.  Symptoms did not improve so he eventually called EMS and was brought in as a code stroke.  He was hypertensive to 230 systolic per EMS  CT: Head: WO Contrast:  IMPRESSION: 1.8 x 2.0 x 1.1 cm parenchymal hemorrhage centered in the left basal ganglia with intraventricular extension. No evidence of hydrocephalus at the time of exam. No significant midline shift.   CTA:  IMPRESSION: 1. CTA spot sign/punctate foci of contrast extravasation within the left basal ganglia hematoma. 2. No aneurysm, vascular malformation, or acute large vessel occlusion identified. 3. 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  MR: Brain:  IMPRESSION: 1. When comparing across modalities, no substantial change in intraparenchymal hemorrhage in the left basal ganglia with intraventricular extension. No progressive mass effect. 2. No visible surrounding acute hemorrhage or mass lesion; however, acute blood products limits assessment  Neurology Following.   Mr. Blann was admitted to inpatient rehabilitation on 08/14/2023 and discharged home on 09/05/2023. He is receiving Home Health Therapy with [facility].  He denies any pain. He rates his pain 0. Also  reports he has a good appetite.     Pain Inventory Average Pain 0 Pain Right Now 0 My pain is  None   BOWEL Number of stools per week: 7  Type of laxative Colace PRN   BLADDER Normal   Mobility use a walker how many minutes can you walk? 5/10 min ability to climb steps?  yes do you drive?  no Do you have any goals in this area?  yes  Function retired I need assistance with the following:  meal prep, household duties, and shopping Do you have any goals in this area?  yes  Neuro/Psych trouble walking  Prior Studies Any changes since last visit?  no  Physicians involved in your care Any changes since last visit?  no   Family History  Problem Relation Age of Onset   Prostate cancer Father    Colon cancer Father    Skin cancer Mother    Cancer Brother    Esophageal cancer Neg Hx    Rectal cancer Neg Hx    Stomach cancer Neg Hx    Social History   Socioeconomic History   Marital status: Married    Spouse name: Not on file   Number of children: Not on file   Years of education: Not on file   Highest education level: Not on file  Occupational History   Not on file  Tobacco Use   Smoking status: Every Day    Current packs/day: 0.50    Average packs/day: 0.5 packs/day for 20.0 years (10.0 ttl pk-yrs)    Types: Cigarettes   Smokeless tobacco: Never   Tobacco comments:  1/2 pack per day  Vaping Use   Vaping status: Never Used  Substance and Sexual Activity   Alcohol use: Yes    Alcohol/week: 12.0 standard drinks of alcohol    Types: 12 Cans of beer per week    Comment: couple of beers each day   Drug use: No   Sexual activity: Not on file  Other Topics Concern   Not on file  Social History Narrative   Not on file   Social Drivers of Health   Financial Resource Strain: Not on file  Food Insecurity: No Food Insecurity (08/11/2023)   Hunger Vital Sign    Worried About Running Out of Food in the Last Year: Never true    Ran Out of Food in the  Last Year: Never true  Transportation Needs: No Transportation Needs (08/11/2023)   PRAPARE - Administrator, Civil Service (Medical): No    Lack of Transportation (Non-Medical): No  Physical Activity: Not on file  Stress: Not on file  Social Connections: Moderately Integrated (08/11/2023)   Social Connection and Isolation Panel [NHANES]    Frequency of Communication with Friends and Family: Three times a week    Frequency of Social Gatherings with Friends and Family: Once a week    Attends Religious Services: 1 to 4 times per year    Active Member of Clubs or Organizations: Yes    Attends Banker Meetings: 1 to 4 times per year    Marital Status: Divorced   Past Surgical History:  Procedure Laterality Date   A-FLUTTER ABLATION N/A 06/19/2023   Procedure: A-FLUTTER ABLATION;  Surgeon: Lanier Prude, MD;  Location: [facility] INVASIVE CV LAB;  Service: Cardiovascular;  Laterality: N/A;   APPENDECTOMY N/A 03/07/2019   Procedure: ROBOTIC ASSISTED APPENDECTOMY;  Surgeon: Karie Soda, MD;  Location: [facility] ORS;  Service: General;  Laterality: N/A;   CARDIAC CATHETERIZATION N/A 10/18/2015   Procedure: Left Heart Cath and Coronary Angiography;  Surgeon: Kathleene Hazel, MD;  Location: [facility] INVASIVE CV LAB;  Service: Cardiovascular;  Laterality: N/A;   CARDIOVERSION N/A 11/24/2015   Procedure: CARDIOVERSION;  Surgeon: Wendall Stade, MD;  Location: [facility] ENDOSCOPY;  Service: Cardiovascular;  Laterality: N/A;   CARDIOVERSION N/A 12/20/2022   Procedure: CARDIOVERSION;  Surgeon: [name], MD;  Location: [facility] INVASIVE CV LAB;  Service: Cardiovascular;  Laterality: N/A;   CYSTOSCOPY WITH STENT PLACEMENT Bilateral 03/07/2019   Procedure: CYSTOSCOPY WITH BILATERAL FIREFLY INJECTION;  Surgeon: Crist Fat, MD;  Location: [facility] ORS;  Service: Urology;  Laterality: Bilateral;   GASTROSTOMY TUBE PLACEMENT  07/04/2004   IR - G tube for tonsilar cancer   IR ANGIO INTRA EXTRACRAN SEL  COM CAROTID INNOMINATE UNI R MOD SED  03/09/2019   IR ANGIO VERTEBRAL SEL VERTEBRAL UNI R MOD SED  03/09/2019   IR CT HEAD LTD  03/09/2019   IR PERCUTANEOUS ART THROMBECTOMY/INFUSION INTRACRANIAL INC DIAG ANGIO  03/09/2019   IR RADIOLOGIST EVAL & MGMT  12/18/2018   LAMINECTOMY     C5/placement of steel plate   LOOP RECORDER INSERTION N/A 06/19/2023   Procedure: LOOP RECORDER INSERTION;  Surgeon: Lanier Prude, MD;  [facility] INVASIVE CV LAB;  Service: Cardiovascular;  Laterality: N/A;   NECK SURGERY  2003   replaced disk   RADIOLOGY WITH ANESTHESIA N/A 03/08/2019   Procedure: IR WITH ANESTHESIA;  Surgeon: Julieanne Cotton, MD;  Location: [facility] OR;  Service: Radiology;  Laterality: N/A;   TEE WITHOUT CARDIOVERSION  N/A 10/15/2015   Procedure: TRANSESOPHAGEAL ECHOCARDIOGRAM (TEE);  Surgeon: Laurey Morale, MD;  Location: [facility] ENDOSCOPY;  Service: Cardiovascular;  Laterality: N/A;   TEE WITHOUT CARDIOVERSION N/A 11/24/2015   Procedure: TRANSESOPHAGEAL ECHOCARDIOGRAM (TEE);  Surgeon: Wendall Stade, MD;  Location: [facility] ENDOSCOPY;  Service: Cardiovascular;  Laterality: N/A;   TEE WITHOUT CARDIOVERSION N/A 12/20/2022   Procedure: TRANSESOPHAGEAL ECHOCARDIOGRAM;  Surgeon: [name], MD;  Location: [facility] INVASIVE CV LAB;  Service: Cardiovascular;  Laterality: N/A;   XI ROBOTIC ASSISTED COLOSTOMY TAKEDOWN N/A 03/07/2019   Procedure: XI ROBOTIC ASSISTED LOW ANTERIOR RESECTION, RIGID PROCTOSCOPY;  Surgeon: Karie Soda, MD;  Location: [facility] ORS;  Service: General;  Laterality: N/A;   Past Medical History:  Diagnosis Date   AF (paroxysmal atrial fibrillation) (HCC) 11/29/2018   Atrial flutter (HCC) 10/12/2015   a. TEE 3/17 with ? LAA clot-->s/p TEE/DCCV 11/24/2015; s/p DCCV 12/2022;s/p ablation 06/2023   CAD (coronary artery disease), native coronary artery 12/06/2015   a. 10/2015 MV: EF  37%, reversible defect inferior apex, intermediate risk findings; b. 10/2015 Cath: 20% mid RCA.   Carotid artery stenosis     1-39% right ICA stenosis and occluded left ICA   Colonic diverticular abscess    Diverticulitis    History of chemotherapy 2005   Cisplatin   Hypothyroidism    NICM (nonischemic cardiomyopathy) (HCC) 10/14/2015   a. Tachy mediated?;  b. Echo 3/17 - Mild concentric LVH, EF 30-35%, anteroseptal, anterior, anterolateral, apical anterior, lateral hypokinesis, trivial MR, mild to moderately reduced RVSF; c. LHC 3/17 - mRCA 20%   Radiation NOv.3,2005-Dec. 15, 2005   6810 cGy in 30 fractions   Tonsil cancer [facility]) 2005   Dr Otila Kluver.  XRT   There were no vitals taken for this visit.  Opioid Risk Score:   Fall Risk Score:  `1  Depression screen [facility] 2/9     09/12/2023    4:17 PM 10/12/2015    9:30 AM  Depression screen PHQ 2/9  Decreased Interest 0 0  Down, Depressed, Hopeless 0 0  PHQ - 2 Score 0 0      Review of Systems  All other systems reviewed and are negative.     Objective:   Physical Exam Vitals and nursing note reviewed.  Constitutional:      Appearance: Normal appearance.  Cardiovascular:     Rate and Rhythm: Normal rate and regular rhythm.     Pulses: Normal pulses.     Heart sounds: Normal heart sounds.  Pulmonary:     Effort: Pulmonary effort is normal.     Breath sounds: Normal breath sounds.  Musculoskeletal:     Comments: Normal Muscle Bulk and Muscle Testing Reveals:  Upper Extremities: Full ROM and Muscle Strength  5/5 Lower Extremities: Right: Decreased ROM: Wearing AFO Left Lower Extremity: Full ROM and Muscle Strength 5/5 Arises from Table Slowly using walker for support Narrow Based  Gait     Skin:    General: Skin is warm and dry.  Neurological:     Mental Status: He is alert and oriented to person, place, and time.  Psychiatric:        Mood and Affect: Mood normal.        Behavior: Behavior normal.         Assessment & Plan:  1. ICH ( Intracerebral hemorrhage), Left Basal Ganglia Embolic Stroke: Continue Home Health Therapy with  [facility]. He has a scheduled appointment with Dr Pearlean [name]. Continue to Monitor.  2, PAF ( Paroxysmal Atrial Fibrillation). Continue current medication regimen. He has a scheduled appointment with Cardiology. Continue to Monitor.  3. Essential Hypertension.: Continue current medication regimen. PCP following. Continue to Monitor.   F/U with Dr Wynn Banker in 4-6 weeks

## 2023-09-24 ENCOUNTER — Telehealth: Payer: Self-pay

## 2023-09-24 ENCOUNTER — Other Ambulatory Visit (INDEPENDENT_AMBULATORY_CARE_PROVIDER_SITE_OTHER): Payer: Medicare Other

## 2023-09-24 DIAGNOSIS — E785 Hyperlipidemia, unspecified: Secondary | ICD-10-CM

## 2023-09-24 DIAGNOSIS — E039 Hypothyroidism, unspecified: Secondary | ICD-10-CM

## 2023-09-24 NOTE — Telephone Encounter (Signed)
Adrian [name] informed of OK

## 2023-09-24 NOTE — Telephone Encounter (Signed)
Copied from CRM [id number]. Topic: Clinical - Home Health Verbal Orders >> Sep 24, 2023 11:26 AM Maxwell Marion wrote: Caller/Agency: Nolberto Hanlon with [facility] Callback Number: [phone] Service Requested: Occupational Therapy Frequency: 1 week 4 Any new concerns about the patient? No

## 2023-09-25 LAB — COMPREHENSIVE METABOLIC PANEL
ALT: 16 U/L (ref 0–53)
AST: 17 U/L (ref 0–37)
Albumin: 4.3 g/dL (ref 3.5–5.2)
Alkaline Phosphatase: 82 U/L (ref 39–117)
BUN: 19 mg/dL (ref 6–23)
CO2: 28 meq/L (ref 19–32)
Calcium: 9.8 mg/dL (ref 8.4–10.5)
Chloride: 96 meq/L (ref 96–112)
Creatinine, Ser: 1.04 mg/dL (ref 0.40–1.50)
GFR: 74.13 mL/min (ref >60.00)
Glucose, Bld: 92 mg/dL (ref 70–99)
Potassium: 4.3 meq/L (ref 3.5–5.1)
Sodium: 135 meq/L (ref 135–145)
Total Bilirubin: 0.9 mg/dL (ref 0.2–1.2)
Total Protein: 8.1 g/dL (ref 6.0–8.3)

## 2023-09-25 LAB — LIPID PANEL
Cholesterol: 146 mg/dL (ref 0–200)
HDL: 48.1 mg/dL (ref >39.00)
LDL Cholesterol: 79 mg/dL (ref 0–99)
NonHDL: 97.95
Total CHOL/HDL Ratio: 3
Triglycerides: 97 mg/dL (ref 0.0–149.0)
VLDL: 19.4 mg/dL (ref 0.0–40.0)

## 2023-09-25 LAB — TSH: TSH: 11.45 u[IU]/mL — ABNORMAL HIGH (ref 0.35–5.50)

## 2023-09-26 ENCOUNTER — Telehealth: Payer: Self-pay

## 2023-09-26 ENCOUNTER — Encounter: Payer: Self-pay | Admitting: Registered Nurse

## 2023-09-26 NOTE — Telephone Encounter (Signed)
Copied from CRM [id number]. Topic: General - Other >> Sep 26, 2023 10:58 AM Turkey A wrote: Reason for CRM: Patient's wife called and said that she viewed his labs on myCHart and would like a call back please [phone]

## 2023-09-27 MED ORDER — LEVOTHYROXINE SODIUM 137 MCG PO TABS: 137.0000 ug | ORAL_TABLET | Freq: Every day | ORAL | 0 refills | Status: AC

## 2023-09-27 NOTE — Telephone Encounter (Signed)
 Please see result note

## 2023-09-27 NOTE — Addendum Note (Signed)
Addended by: Christy Sartorius on: 09/27/2023 11:42 AM   Modules accepted: Orders

## 2023-09-27 NOTE — Addendum Note (Signed)
Addended by: Christy Sartorius on: 09/27/2023 11:51 AM   Modules accepted: Orders

## 2023-10-03 ENCOUNTER — Other Ambulatory Visit: Payer: Self-pay | Admitting: Family Medicine

## 2023-10-03 MED ORDER — ATORVASTATIN CALCIUM 80 MG PO TABS: 80.0000 mg | ORAL_TABLET | Freq: Every day | ORAL | 3 refills | Status: AC

## 2023-10-03 MED ORDER — AMLODIPINE BESYLATE 2.5 MG PO TABS: 2.5000 mg | ORAL_TABLET | Freq: Every day | ORAL | 1 refills | Status: DC

## 2023-10-03 NOTE — Telephone Encounter (Signed)
 Copied from CRM [id number]. Topic: Clinical - Medication Refill >> Oct 03, 2023  2:24 PM Herbert Seta B wrote: Most Recent Primary Care Visit:  Provider: [facility]  Department: [facility]  Visit Type: LAB VISIT  Date: 09/24/2023  Medication: 1-amLODipine (NORVASC) 2.5 MG tablet  2-atorvastatin (LIPITOR) 80 MG tablet   Has the patient contacted their pharmacy? Yes-needs sent (Agent: If no, request that the patient contact the pharmacy for the refill. If patient does not wish to contact the pharmacy document the reason why and proceed with request.) (Agent: If yes, when and what did the pharmacy advise?)  Is this the correct pharmacy for this prescription? yes If no, delete pharmacy and type the correct one.  This is the patient's preferred pharmacy:  [id number] [place], [place] - [phone] AT [facility] & [address] [place] [address] Phone: [phone] Fax: [phone]    Has the prescription been filled recently? no  Is the patient out of the medication? YES  Has the patient been seen for an appointment in the last year OR does the patient have an upcoming appointment? yes  Can we respond through MyChart? yes  Agent: Please be advised that Rx refills may take up to 3 business days. We ask that you follow-up with your pharmacy.

## 2023-10-04 ENCOUNTER — Ambulatory Visit (INDEPENDENT_AMBULATORY_CARE_PROVIDER_SITE_OTHER): Payer: BC Managed Care – PPO

## 2023-10-04 DIAGNOSIS — I428 Other cardiomyopathies: Secondary | ICD-10-CM

## 2023-10-05 LAB — CUP PACEART REMOTE DEVICE CHECK
Date Time Interrogation Session: 20250227142000
Implantable Pulse Generator Implant Date: 20241112
Pulse Gen Serial Number: 126890

## 2023-10-09 ENCOUNTER — Encounter: Payer: Self-pay | Admitting: Cardiology

## 2023-10-10 NOTE — Addendum Note (Signed)
 Addended by: Elease Etienne A on: 10/10/2023 04:02 PM   Modules accepted: Orders

## 2023-10-10 NOTE — Progress Notes (Signed)
 Carelink Summary Report / Loop Recorder

## 2023-10-17 ENCOUNTER — Other Ambulatory Visit

## 2023-10-17 DIAGNOSIS — E039 Hypothyroidism, unspecified: Secondary | ICD-10-CM

## 2023-10-17 LAB — TSH: TSH: 3.6 u[IU]/mL (ref 0.35–5.50)

## 2023-10-18 ENCOUNTER — Telehealth: Payer: Self-pay | Admitting: *Deleted

## 2023-10-18 DIAGNOSIS — G8191 Hemiplegia, unspecified affecting right dominant side: Secondary | ICD-10-CM

## 2023-10-18 DIAGNOSIS — I610 Nontraumatic intracerebral hemorrhage in hemisphere, subcortical: Secondary | ICD-10-CM

## 2023-10-18 NOTE — Telephone Encounter (Signed)
 Copied from CRM [id number]. Topic: General - Other >> Oct 18, 2023  2:30 PM Turkey A wrote: Reason for CRM: Patient is being discharged from Physical Therapy today 10/18/23- PT suggest patient goes to Out Patient Physical Therapy and also would like for patient to have a call when he is scheduled with Out Patient Therapy

## 2023-10-19 NOTE — Telephone Encounter (Signed)
 Referral placed.

## 2023-10-23 ENCOUNTER — Ambulatory Visit (INDEPENDENT_AMBULATORY_CARE_PROVIDER_SITE_OTHER): Payer: Medicare Other | Admitting: Vascular Surgery

## 2023-10-23 ENCOUNTER — Encounter: Payer: Self-pay | Admitting: Vascular Surgery

## 2023-10-23 VITALS — BP 138/81 | HR 65 | Temp 98.2°F | Resp 18 | Ht 68.0 in | Wt 140.8 lb

## 2023-10-23 DIAGNOSIS — I6522 Occlusion and stenosis of left carotid artery: Secondary | ICD-10-CM | POA: Insufficient documentation

## 2023-10-23 DIAGNOSIS — I6521 Occlusion and stenosis of right carotid artery: Secondary | ICD-10-CM

## 2023-10-23 NOTE — Progress Notes (Signed)
 Patient name: Adrian [name] MRN: [id number] DOB: 1956-01-14 Sex: male  REASON FOR CONSULT: Occluded left internal carotid artery with disturbed flow in the right subclavian  HPI: Adrian [name] is a 68 y.o. male, with history of a flutter on Eliquis, nonischemic cardiomyopathy, hyperlipidemia that presents for evaluation of left internal carotid artery occlusion (also noted disturbed flow in right subclavian).  The referral was placed by Dr. Mayford Knife with cardiology after a carotid ultrasound on 07/09/23.  Since his referral here he was admitted with a left basal ganglia stroke involving a bleed on 08/11/2023.  He did have right hemiparesis.  CTA neck on 08/11/2023 again showed chronic left ICA occlusion.  The stroke was felt to be related to hypertension.  He did go to rehab and is making ongoing recovery.  Past Medical History:  Diagnosis Date   AF (paroxysmal atrial fibrillation) (HCC) 11/29/2018   Atrial flutter (HCC) 10/12/2015   a. TEE 3/17 with ? LAA clot-->s/p TEE/DCCV 11/24/2015; s/p DCCV 12/2022;s/p ablation 06/2023   CAD (coronary artery disease), native coronary artery 12/06/2015   a. 10/2015 MV: EF  37%, reversible defect inferior apex, intermediate risk findings; b. 10/2015 Cath: 20% mid RCA.   Carotid artery stenosis    1-39% right ICA stenosis and occluded left ICA   Colonic diverticular abscess    Diverticulitis    History of chemotherapy 2005   Cisplatin   Hypothyroidism    NICM (nonischemic cardiomyopathy) (HCC) 10/14/2015   a. Tachy mediated?;  b. Echo 3/17 - Mild concentric LVH, EF 30-35%, anteroseptal, anterior, anterolateral, apical anterior, lateral hypokinesis, trivial MR, mild to moderately reduced RVSF; c. LHC 3/17 - mRCA 20%   Radiation NOv.3,2005-Dec. 15, 2005   6810 cGy in 30 fractions   Tonsil cancer [facility]) 2005   Dr Otila Kluver.  XRT    Past Surgical History:  Procedure Laterality Date   A-FLUTTER ABLATION N/A 06/19/2023   Procedure: A-FLUTTER ABLATION;  Surgeon:  Lanier Prude, MD;  Location: [facility] INVASIVE CV LAB;  Service: Cardiovascular;  Laterality: N/A;   APPENDECTOMY N/A 03/07/2019   Procedure: ROBOTIC ASSISTED APPENDECTOMY;  Surgeon: Karie Soda, MD;  Location: [facility] ORS;  Service: General;  Laterality: N/A;   CARDIAC CATHETERIZATION N/A 10/18/2015   Procedure: Left Heart Cath and Coronary Angiography;  Surgeon: Kathleene Hazel, MD;  Location: [facility] INVASIVE CV LAB;  Service: Cardiovascular;  Laterality: N/A;   CARDIOVERSION N/A 11/24/2015   Procedure: CARDIOVERSION;  Surgeon: Wendall Stade, MD;  Location: [facility] ENDOSCOPY;  Service: Cardiovascular;  Laterality: N/A;   CARDIOVERSION N/A 12/20/2022   Procedure: CARDIOVERSION;  Surgeon: [name], MD;  Location: [facility] INVASIVE CV LAB;  Service: Cardiovascular;  Laterality: N/A;   CYSTOSCOPY WITH STENT PLACEMENT Bilateral 03/07/2019   Procedure: CYSTOSCOPY WITH BILATERAL FIREFLY INJECTION;  Surgeon: Crist Fat, MD;  Location: [facility];  Service: Urology;  Laterality: Bilateral;   GASTROSTOMY TUBE PLACEMENT  07/04/2004   IR - G tube for tonsilar cancer   IR ANGIO INTRA EXTRACRAN SEL COM CAROTID INNOMINATE UNI R MOD SED  03/09/2019   IR ANGIO VERTEBRAL SEL VERTEBRAL UNI R MOD SED  03/09/2019   IR CT HEAD LTD  03/09/2019   IR PERCUTANEOUS ART THROMBECTOMY/INFUSION INTRACRANIAL INC DIAG ANGIO  03/09/2019   IR RADIOLOGIST EVAL & MGMT  12/18/2018   LAMINECTOMY     C5/placement of steel plate   LOOP RECORDER INSERTION N/A 06/19/2023   Procedure: LOOP RECORDER INSERTION;  Surgeon: Lanier Prude, MD;  Location: [facility] INVASIVE CV LAB;  Service: Cardiovascular;  Laterality: N/A;   NECK SURGERY  2003   replaced disk   RADIOLOGY WITH ANESTHESIA N/A 03/08/2019   Procedure: IR WITH ANESTHESIA;  Surgeon: Julieanne Cotton, MD;  Location: [facility] OR;  Service: Radiology;  Laterality: N/A;   TEE WITHOUT CARDIOVERSION N/A 10/15/2015   Procedure: TRANSESOPHAGEAL ECHOCARDIOGRAM (TEE);  Surgeon: Laurey Morale, MD;   Location: [facility] ENDOSCOPY;  Service: Cardiovascular;  Laterality: N/A;   TEE WITHOUT CARDIOVERSION N/A 11/24/2015   Procedure: TRANSESOPHAGEAL ECHOCARDIOGRAM (TEE);  Surgeon: Wendall Stade, MD;  Location: [facility] ENDOSCOPY;  Service: Cardiovascular;  Laterality: N/A;   TEE WITHOUT CARDIOVERSION N/A 12/20/2022   Procedure: TRANSESOPHAGEAL ECHOCARDIOGRAM;  Surgeon: [name], MD;  Location: [facility] INVASIVE CV LAB;  Service: Cardiovascular;  Laterality: N/A;   XI ROBOTIC ASSISTED COLOSTOMY TAKEDOWN N/A 03/07/2019   Procedure: XI ROBOTIC ASSISTED LOW ANTERIOR RESECTION, RIGID PROCTOSCOPY;  Surgeon: Karie Soda, MD;  Location: [facility];  Service: General;  Laterality: N/A;    Family History  Problem Relation Age of Onset   Prostate cancer Father    Colon cancer Father    Skin cancer Mother    Cancer Brother    Esophageal cancer Neg Hx    Rectal cancer Neg Hx    Stomach cancer Neg Hx     SOCIAL HISTORY: Social History   Socioeconomic History   Marital status: Married    Spouse name: Not on file   Number of children: Not on file   Years of education: Not on file   Highest education level: Not on file  Occupational History   Not on file  Tobacco Use   Smoking status: Every Day    Current packs/day: 0.50    Average packs/day: 0.5 packs/day for 20.0 years (10.0 ttl pk-yrs)    Types: Cigarettes   Smokeless tobacco: Never   Tobacco comments:    1/2 pack per day  Vaping Use   Vaping status: Never Used  Substance and Sexual Activity   Alcohol use: Yes    Alcohol/week: 12.0 standard drinks of alcohol    Types: 12 Cans of beer per week    Comment: couple of beers each day   Drug use: No   Sexual activity: Not on file  Other Topics Concern   Not on file  Social History Narrative   Not on file   Social Drivers of Health   Financial Resource Strain: Not on file  Food Insecurity: No Food Insecurity (08/11/2023)   Hunger Vital Sign    Worried About Running Out of Food in the Last Year:  Never true    Ran Out of Food in the Last Year: Never true  Transportation Needs: No Transportation Needs (08/11/2023)   PRAPARE - Administrator, Civil Service (Medical): No    Lack of Transportation (Non-Medical): No  Physical Activity: Not on file  Stress: Not on file  Social Connections: Moderately Integrated (08/11/2023)   Social Connection and Isolation Panel [NHANES]    Frequency of Communication with Friends and Family: Three times a week    Frequency of Social Gatherings with Friends and Family: Once a week    Attends Religious Services: 1 to 4 times per year    Active Member of [employer] or Organizations: Yes    Attends Banker Meetings: 1 to 4 times per year    Marital Status: Divorced  Intimate Partner Violence: Not At Risk (08/11/2023)   Humiliation, Afraid,  Rape, and Kick questionnaire    Fear of Current or Ex-Partner: No    Emotionally Abused: No    Physically Abused: No    Sexually Abused: No    No Known Allergies  Current Outpatient Medications  Medication Sig Dispense Refill   acetaminophen (TYLENOL) 325 MG tablet Take 1-2 tablets (325-650 mg total) by mouth every 4 (four) hours as needed for mild pain (pain score 1-3).     amiodarone (PACERONE) 200 MG tablet Take 1 tablet (200 mg total) by mouth daily. 30 tablet 0   amLODipine (NORVASC) 2.5 MG tablet Take 1 tablet (2.5 mg total) by mouth daily. 90 tablet 1   apixaban (ELIQUIS) 5 MG TABS tablet Take 5 mg by mouth 2 (two) times daily.     atorvastatin (LIPITOR) 80 MG tablet Take 1 tablet (80 mg total) by mouth daily. 90 tablet 3   levothyroxine (SYNTHROID) 137 MCG tablet Take 1 tablet (137 mcg total) by mouth daily before breakfast. 90 tablet 0   senna-docusate (SENOKOT-S) 8.6-50 MG tablet Take 1 tablet by mouth 2 (two) times daily. 100 tablet 0   No current facility-administered medications for this visit.    REVIEW OF SYSTEMS:  [X]  denotes positive finding, [ ]  denotes negative finding Cardiac   Comments:  Chest pain or chest pressure:    Shortness of breath upon exertion:    Short of breath when lying flat:    Irregular heart rhythm:        Vascular    Pain in calf, thigh, or hip brought on by ambulation:    Pain in feet at night that wakes you up from your sleep:     Blood clot in your veins:    Leg swelling:         Pulmonary    Oxygen at home:    Productive cough:     Wheezing:         Neurologic    Sudden weakness in arms or legs:     Sudden numbness in arms or legs:     Sudden onset of difficulty speaking or slurred speech:    Temporary loss of vision in one eye:     Problems with dizziness:         Gastrointestinal    Blood in stool:     Vomited blood:         Genitourinary    Burning when urinating:     Blood in urine:        Psychiatric    Major depression:         Hematologic    Bleeding problems:    Problems with blood clotting too easily:        Skin    Rashes or ulcers:        Constitutional    Fever or chills:      PHYSICAL EXAM: There were no vitals filed for this visit.  GENERAL: The patient is a well-nourished male, in no acute distress. The vital signs are documented above. CARDIAC: There is a regular rate and rhythm.  PULMONARY: No respiratory distress. ABDOMEN: Soft and non-tender. MUSCULOSKELETAL: There are no major deformities or cyanosis. NEUROLOGIC: No focal weakness or paresthesias are detected.  Right upper and lower extremity 4 out of 5 SKIN: There are no ulcers or rashes noted. PSYCHIATRIC: The patient has a normal affect.  DATA:   CTA neck reviewed from 08/11/2023 with chronically occluded left ICA and no significant contralateral right ICA stenosis.  Assessment/Plan:  68 y.o. male, with history of a flutter on Eliquis, nonischemic cardiomyopathy, hyperlipidemia that presents for evaluation of left internal carotid artery occlusion (also noted disturbed flow in right subclavian).  The referral was placed by Dr.  Mayford Knife with cardiology after a carotid ultrasound on 07/09/23.  Since his referral here he was admitted with a left basal ganglia stroke involving a bleed on 08/11/2023.  He did have right hemiparesis.  CTA neck on 08/11/2023 again showed chronic left ICA occlusion.  The stroke was felt to be related to hypertension.  Discussed there is no role for revascularization of a chronically occluded left ICA.  This has been occluded since at least 2020 in review of the images and has had a prior intervention by neuro IR.  I discussed that his recent stroke in January was due to a basal ganglia bleed and not related to his carotid artery disease.  I discussed that his right carotid artery is providing collateral flow in communication to the left hemisphere along with posterior circulation.  He does not have any significant right ICA disease at this time but we will follow with surveillance.  Will order another carotid duplex in 1 year.  There was flow disturbance in the right subclavian artery but he has a palpable radial pulse and no right arm claudication that would warrant intervention.  Discussed we will follow asymptomatic subclavian disease on the right.   Cephus Shelling, MD [facility] Office: [phone]

## 2023-10-24 NOTE — Progress Notes (Unsigned)
  Electrophysiology Office Follow up Visit Note:    Date:  10/24/2023   ID:  Adrian [name], DOB 1955-10-28, MRN [id number]  PCP:  Kristian Covey, MD  [facility] Cardiologist:  Armanda Magic, MD  [facility] Electrophysiologist:  Lanier Prude, MD    Interval History:     Adrian [name] is a 68 y.o. male who presents for a follow up visit.   The patient has a history of persistent atrial fibrillation.  He was previously on amiodarone.  He underwent atrial flutter ablation and loop recorder implant on June 19, 2023.  He saw Mardelle Matte in clinic July 17, 2023.  At that appointment he was doing well.  On January 4 he presented to the hospital with acute onset right sided weakness.  He was extremely hypertensive with systolic blood pressures in the 230s.  He was ultimately discharged to rehab.  His anticoagulation was stopped in the setting of intracranial hemorrhage.  His intracranial hemorrhage was felt to be secondary to severe hypertension.  He saw his primary care physician on September 12, 2023.  In that note he reports the patient was back on his anticoagulant.  His loop recorder tracings since implant have shown no recurrence of atrial arrhythmias.       Past medical, surgical, social and family history were reviewed.  ROS:   Please see the history of present illness.    All other systems reviewed and are negative.  EKGs/Labs/Other Studies Reviewed:    The following studies were reviewed today:  Loop recorder interrogations personally reviewed and showed no evidence of atrial fibrillation        Physical Exam:    VS:  There were no vitals taken for this visit.    Wt Readings from Last 3 Encounters:  10/23/23 140 lb 12.8 oz (63.9 kg)  09/19/23 145 lb (65.8 kg)  09/12/23 139 lb 6.4 oz (63.2 kg)     GEN: no distress CARD: RRR, No MRG RESP: No IWOB. CTAB.      ASSESSMENT:    No diagnosis found. PLAN:    In order of problems listed  above:  #Atrial flutter #High risk med monitoring-amiodarone  no recurrence after catheter ablation. CMP, TSH okay for continued amiodarone use on recent check. Recommend stopping amiodarone at this time.  We will continue to monitor for arrhythmia recurrence using his loop recorder. I have discussed the pros and cons of staying on anticoagulation given his history of atrial arrhythmias and intracranial hemorrhage.  For now, I think the risks of being anticoagulation outweigh the benefits.  We will continue to monitor his rhythm using an implantable loop recorder.  #Hypertension Continue amlodipine  Follow-up 6 months with EP APP.   Signed, Steffanie Dunn, MD, [facility], [facility] 10/24/2023 6:15 PM    Electrophysiology [facility]

## 2023-10-25 ENCOUNTER — Telehealth: Payer: Self-pay | Admitting: Cardiology

## 2023-10-25 ENCOUNTER — Encounter: Payer: Self-pay | Admitting: Cardiology

## 2023-10-25 ENCOUNTER — Ambulatory Visit: Payer: BC Managed Care – PPO | Attending: Cardiology | Admitting: Cardiology

## 2023-10-25 VITALS — BP 120/80 | HR 64 | Ht 68.0 in | Wt 143.2 lb

## 2023-10-25 DIAGNOSIS — I10 Essential (primary) hypertension: Secondary | ICD-10-CM | POA: Insufficient documentation

## 2023-10-25 DIAGNOSIS — Z79899 Other long term (current) drug therapy: Secondary | ICD-10-CM | POA: Diagnosis present

## 2023-10-25 DIAGNOSIS — I4892 Unspecified atrial flutter: Secondary | ICD-10-CM | POA: Diagnosis present

## 2023-10-25 NOTE — Telephone Encounter (Signed)
 Patient's wife called with patient in background.  She wants to inform Dr. Lalla Brothers that patient was Lopressor not on list when he had stroke in Jan.  Has not been taking this and wanted to make sure Dr. Lalla Brothers knows that.  I edited the med list to show he is not taking    He is aware to stay off amiodarone as per today's visit orders, and aware if there are other recommendations they will be called back.

## 2023-10-25 NOTE — Patient Instructions (Signed)
Medication Instructions:  Your physician has recommended you make the following change in your medication:  1) STOP taking amiodarone  *If you need a refill on your cardiac medications before your next appointment, please call your pharmacy*  Follow-Up: At [facility], you and your health needs are our priority.  As part of our continuing mission to provide you with exceptional heart care, we have created designated Provider Care Teams.  These Care Teams include your primary Cardiologist (physician) and Advanced Practice Providers (APPs -  Physician Assistants and Nurse Practitioners) who all work together to provide you with the care you need, when you need it.  Your next appointment:   6 months  Provider:   You will see one of the following Advanced Practice Providers on your designated Care Team:   Francis Dowse, Charlott Holler [address]" [place], [place] Sherie Don, NP Canary Brim, NP

## 2023-10-25 NOTE — Telephone Encounter (Signed)
 Pt c/o medication issue:  1. Name of Medication: metoprolol tartrate (LOPRESSOR) 25 MG tablet   2. How are you currently taking this medication (dosage and times per day)?    3. Are you having a reaction (difficulty breathing--STAT)? no  4. What is your medication issue? Wife called to say that, patient is no longer taken this medication. She stated when he was in the hospital they told him to stop taking the medication. Please advise

## 2023-10-26 ENCOUNTER — Encounter: Payer: Self-pay | Admitting: Physical Medicine & Rehabilitation

## 2023-10-26 ENCOUNTER — Encounter: Payer: Medicare Other | Attending: Registered Nurse | Admitting: Physical Medicine & Rehabilitation

## 2023-10-26 VITALS — BP 122/78 | HR 66 | Ht 68.0 in

## 2023-10-26 DIAGNOSIS — I610 Nontraumatic intracerebral hemorrhage in hemisphere, subcortical: Secondary | ICD-10-CM | POA: Diagnosis present

## 2023-10-26 NOTE — Progress Notes (Signed)
 Subjective:    Patient ID: Adrian [name], male    DOB: [birth year], 68 y.o.   MRN: [id number] CT head dated 08/27/2023 IMPRESSION: No acute intracranial abnormality. Interval decrease in size of left basal ganglia hemorrhage, now measuring 10 x 7 mm, previously 20 x 12 mm. Resolution of intraventricular blood products. No hydrocephalus.     Electronically Signed   By: Lorenza Cambridge M.D.   On: 08/27/2023 13:51  Adrian [name] was admitted to rehab 08/14/2023 for inpatient therapies to consist of PT, ST and OT at least three hours five days a week. Past admission physiatrist, therapy team and rehab RN have worked together to provide customized collaborative inpatient rehab.  PRAFO for right heel cord while in bed.  Per acute team, amiodarone resumed on admission to CIR. Follow-up labs stable. MBS performed as patient was coughing with all consistencies. Continued on regular diet with thin liquids.  Follow-up labs dated 1/13 and 1/20 were stable. 2 weeks post [facility] , Dr Roda Shutters , neuro rec repeat CT head - this was reviewed by Dr Roda Shutters as well as Pearlean [name].  Neuro recommends start Eliquis 1/20. Noted sensory deficit due to CVA. Left foot  sensation may be related to neuropathy, not a diabetic but has history of EtOH as well as chemotherapy about 20 years ago. Follow-up labs on 1/27: BMP normal and WBC count stable, platelet count normal. Neuropsychology consultation obtained on 1/28.   Admit date: 08/14/2023 Discharge date: 09/05/2023 HPI [name] is now Mod I , living by himself  Amb with [place], uses walker in the am  HHPT finished up last week. The patient is doing laundry as well as dishes and doing some light meal prep as well.  He is not driving. Reports no falls Continues to use AFO for ambulation interested in pursuing outpatient therapy.   Pain Inventory Average Pain 0 Pain Right Now 0 My pain is intermittent and restless legs  In the last 24 hours, has pain interfered with the following? General  activity 0 Relation with others 0 Enjoyment of life 0 What TIME of day is your pain at its worst? night Sleep (in general) Fair  Pain is worse with:  at night with restless legs Pain improves with:  nothing Relief from Meds:  no med  Family History  Problem Relation Age of Onset   Prostate cancer Father    Colon cancer Father    Skin cancer Mother    Cancer Brother    Esophageal cancer Neg Hx    Rectal cancer Neg Hx    Stomach cancer Neg Hx    Social History   Socioeconomic History   Marital status: Married    Spouse name: Not on file   Number of children: Not on file   Years of education: Not on file   Highest education level: Not on file  Occupational History   Not on file  Tobacco Use   Smoking status: Former    Current packs/day: 0.00    Average packs/day: 0.5 packs/day for 20.0 years (10.0 ttl pk-yrs)    Types: Cigarettes    Quit date: 08/11/2023    Years since quitting: 0.2   Smokeless tobacco: Never   Tobacco comments:    1/2 pack per day  Vaping Use   Vaping status: Never Used  Substance and Sexual Activity   Alcohol use: Yes    Alcohol/week: 12.0 standard drinks of alcohol    Types: 12 Cans of beer per week  Comment: couple of beers each day   Drug use: No   Sexual activity: Not on file  Other Topics Concern   Not on file  Social History Narrative   Not on file   Social Drivers of Health   Financial Resource Strain: Not on file  Food Insecurity: No Food Insecurity (08/11/2023)   Hunger Vital Sign    Worried About Running Out of Food in the Last Year: Never true    Ran Out of Food in the Last Year: Never true  Transportation Needs: No Transportation Needs (08/11/2023)   PRAPARE - Administrator, Civil Service (Medical): No    Lack of Transportation (Non-Medical): No  Physical Activity: Not on file  Stress: Not on file  Social Connections: Moderately Integrated (08/11/2023)   Social Connection and Isolation Panel [NHANES]    Frequency  of Communication with Friends and Family: Three times a week    Frequency of Social Gatherings with Friends and Family: Once a week    Attends Religious Services: 1 to 4 times per year    Active Member of Clubs or Organizations: Yes    Attends Banker Meetings: 1 to 4 times per year    Marital Status: Divorced   Past Surgical History:  Procedure Laterality Date   A-FLUTTER ABLATION N/A 06/19/2023   Procedure: A-FLUTTER ABLATION;  Surgeon: Lanier Prude, MD;  Location: [facility];  Service: Cardiovascular;  Laterality: N/A;   APPENDECTOMY N/A 03/07/2019   Procedure: ROBOTIC ASSISTED APPENDECTOMY;  Surgeon: Karie Soda, MD;  Location: [facility];  Service: General;  Laterality: N/A;   CARDIAC CATHETERIZATION N/A 10/18/2015   Procedure: Left Heart Cath and Coronary Angiography;  Surgeon: Kathleene Hazel, MD;  Location: [facility] INVASIVE CV LAB;  Service: Cardiovascular;  Laterality: N/A;   CARDIOVERSION N/A 11/24/2015   Procedure: CARDIOVERSION;  Surgeon: Wendall Stade, MD;  Location: [facility] ENDOSCOPY;  Service: Cardiovascular;  Laterality: N/A;   CARDIOVERSION N/A 12/20/2022   Procedure: CARDIOVERSION;  Surgeon: [name], MD;  Location: [facility];  Service: Cardiovascular;  Laterality: N/A;   CYSTOSCOPY WITH STENT PLACEMENT Bilateral 03/07/2019   Procedure: CYSTOSCOPY WITH BILATERAL FIREFLY INJECTION;  Surgeon: Crist Fat, MD;  Location: [facility];  Service: Urology;  Laterality: Bilateral;   GASTROSTOMY TUBE PLACEMENT  07/04/2004   IR - G tube for tonsilar cancer   IR ANGIO INTRA EXTRACRAN SEL COM CAROTID INNOMINATE UNI R MOD SED  03/09/2019   IR ANGIO VERTEBRAL SEL VERTEBRAL UNI R MOD SED  03/09/2019   IR CT HEAD LTD  03/09/2019   IR PERCUTANEOUS ART THROMBECTOMY/INFUSION INTRACRANIAL INC DIAG ANGIO  03/09/2019   IR RADIOLOGIST EVAL & MGMT  12/18/2018   LAMINECTOMY     C5/placement of steel plate   LOOP RECORDER INSERTION N/A 06/19/2023   Procedure: LOOP  RECORDER INSERTION;  Surgeon: Lanier Prude, MD;  Location: [facility];  Service: Cardiovascular;  Laterality: N/A;   NECK SURGERY  2003   replaced disk   RADIOLOGY WITH ANESTHESIA N/A 03/08/2019   Procedure: IR WITH ANESTHESIA;  Surgeon: Julieanne Cotton, MD;  Location: [facility];  Service: Radiology;  Laterality: N/A;   TEE WITHOUT CARDIOVERSION N/A 10/15/2015   Procedure: TRANSESOPHAGEAL ECHOCARDIOGRAM (TEE);  Surgeon: Laurey Morale, MD;  Location: [facility] ENDOSCOPY;  Service: Cardiovascular;  Laterality: N/A;   TEE WITHOUT CARDIOVERSION N/A 11/24/2015   Procedure: TRANSESOPHAGEAL ECHOCARDIOGRAM (TEE);  Surgeon: Wendall Stade, MD;  Location:  [facility]OSCOPY;  Service: Cardiovascular;  Laterality: N/A;   TEE WITHOUT CARDIOVERSION N/A 12/20/2022   Procedure: TRANSESOPHAGEAL ECHOCARDIOGRAM;  Surgeon: [name], MD;  Location: [facility] INVASIVE CV LAB;  Service: Cardiovascular;  Laterality: N/A;   XI ROBOTIC ASSISTED COLOSTOMY TAKEDOWN N/A 03/07/2019   Procedure: XI ROBOTIC ASSISTED LOW ANTERIOR RESECTION, RIGID PROCTOSCOPY;  Surgeon: Karie Soda, MD;  Location: [facility];  Service: General;  Laterality: N/A;   Past Surgical History:  Procedure Laterality Date   A-FLUTTER ABLATION N/A 06/19/2023   Procedure: A-FLUTTER ABLATION;  Surgeon: Lanier Prude, MD;  [facility];  Service: Cardiovascular;  Laterality: N/A;   APPENDECTOMY N/A 03/07/2019   Procedure: ROBOTIC ASSISTED APPENDECTOMY;  Surgeon: Karie Soda, MD;  Location: [facility];  Service: General;  Laterality: N/A;   CARDIAC CATHETERIZATION N/A 10/18/2015   Procedure: Left Heart Cath and Coronary Angiography;  Surgeon: Kathleene Hazel, MD;  Location: [facility] INVASIVE CV LAB;  Service: Cardiovascular;  Laterality: N/A;   CARDIOVERSION N/A 11/24/2015   Procedure: CARDIOVERSION;  Surgeon: Wendall Stade, MD;  Location: [facility] ENDOSCOPY;  Service: Cardiovascular;  Laterality: N/A;   CARDIOVERSION N/A 12/20/2022   Procedure:  CARDIOVERSION;  Surgeon: [name], MD;  [facility]: [facility];  Service: Cardiovascular;  Laterality: N/A;   CYSTOSCOPY WITH STENT PLACEMENT Bilateral 03/07/2019   Procedure: CYSTOSCOPY WITH BILATERAL FIREFLY INJECTION;  Surgeon: Crist Fat, MD;  Location: [facility];  Service: Urology;  Laterality: Bilateral;   GASTROSTOMY TUBE PLACEMENT  07/04/2004   IR - G tube for tonsilar cancer   IR ANGIO INTRA EXTRACRAN SEL COM CAROTID INNOMINATE UNI R MOD SED  03/09/2019   IR ANGIO VERTEBRAL SEL VERTEBRAL UNI R MOD SED  03/09/2019   IR CT HEAD LTD  03/09/2019   IR PERCUTANEOUS ART THROMBECTOMY/INFUSION INTRACRANIAL INC DIAG ANGIO  03/09/2019   IR RADIOLOGIST EVAL & MGMT  12/18/2018   LAMINECTOMY     C5/placement of steel plate   LOOP RECORDER INSERTION N/A 06/19/2023   Procedure: LOOP RECORDER INSERTION;  Surgeon: Lanier Prude, MD;  [facility]: [facility];  Service: Cardiovascular;  Laterality: N/A;   NECK SURGERY  2003   replaced disk   RADIOLOGY WITH ANESTHESIA N/A 03/08/2019   Procedure: IR WITH ANESTHESIA;  Surgeon: Julieanne Cotton, MD;  Location: [facility];  Service: Radiology;  Laterality: N/A;   TEE WITHOUT CARDIOVERSION N/A 10/15/2015   Procedure: TRANSESOPHAGEAL ECHOCARDIOGRAM (TEE);  Surgeon: Laurey Morale, MD;  Location: [facility] ENDOSCOPY;  Service: Cardiovascular;  Laterality: N/A;   TEE WITHOUT CARDIOVERSION N/A 11/24/2015   Procedure: TRANSESOPHAGEAL ECHOCARDIOGRAM (TEE);  Surgeon: Wendall Stade, MD;  Location: [facility] ENDOSCOPY;  Service: Cardiovascular;  Laterality: N/A;   TEE WITHOUT CARDIOVERSION N/A 12/20/2022   Procedure: TRANSESOPHAGEAL ECHOCARDIOGRAM;  Surgeon: [name], MD;  Location: [facility] INVASIVE CV LAB;  Service: Cardiovascular;  Laterality: N/A;   XI ROBOTIC ASSISTED COLOSTOMY TAKEDOWN N/A 03/07/2019   Procedure: XI ROBOTIC ASSISTED LOW ANTERIOR RESECTION, RIGID PROCTOSCOPY;  Surgeon: Karie Soda, MD;  Location: [facility];  Service: General;  Laterality: N/A;    Past Medical History:  Diagnosis Date   AF (paroxysmal atrial fibrillation) (HCC) 11/29/2018   Atrial flutter (HCC) 10/12/2015   a. TEE 3/17 with ? LAA clot-->s/p TEE/DCCV 11/24/2015; s/p DCCV 12/2022;s/p ablation 06/2023   CAD (coronary artery disease), native coronary artery 12/06/2015   a. 10/2015 MV: EF  37%, reversible defect inferior apex, intermediate risk findings; b. 10/2015 Cath: 20%  mid RCA.   Carotid artery stenosis    1-39% right ICA stenosis and occluded left ICA   Colonic diverticular abscess    Diverticulitis    History of chemotherapy 2005   Cisplatin   Hypothyroidism    NICM (nonischemic cardiomyopathy) (HCC) 10/14/2015   a. Tachy mediated?;  b. Echo 3/17 - Mild concentric LVH, EF 30-35%, anteroseptal, anterior, anterolateral, apical anterior, lateral hypokinesis, trivial MR, mild to moderately reduced RVSF; c. LHC 3/17 - mRCA 20%   Radiation NOv.3,2005-Dec. 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  XRT   BP 122/78   Pulse 66   Ht 5\' 8"  (1.727 m)   SpO2 94%   BMI 21.77 kg/m   Opioid Risk Score:   Fall Risk Score:  `1  Depression screen [facility] 2/9     10/26/2023    2:39 PM 09/19/2023    2:11 PM 09/12/2023    4:17 PM 10/12/2015    9:30 AM  Depression screen PHQ 2/9  Decreased Interest 0 0 0 0  Down, Depressed, Hopeless 0 0 0 0  PHQ - 2 Score 0 0 0 0  Altered sleeping  0    Tired, decreased energy  0    Change in appetite  0    Feeling bad or failure about yourself   0    Trouble concentrating  0    Moving slowly or fidgety/restless  0    Suicidal thoughts  0    PHQ-9 Score  0    Difficult doing work/chores  Not difficult at all       Review of Systems     Objective:   Physical Exam General No acute distress Mood and affect appropriate Speech minimal dysarthria Language intact naming, good fluency and comprehension Short-term memory 2/3 objects after 3 minutes delay Ambulates without assistive device for  short distances otherwise uses a cane he has a right AFO no evidence of toe drag or knee instability he does have a stifflegged gait with cocontraction of quad and hamstring. Motor strength is 4/5 in the right deltoid bicep tricep grip as well as right hip flexion knee extension and to minus ankle dorsiflexion plantarflexion Sensation intact to light touch right upper extremity There is mild dysdiadochokinesis with rapid alternating supination pronation of the right upper extremity intact on left side Finger thumb opposition is mildly reduced/slow in the right upper extremity compared to left side. Finger-nose-finger testing is mildly slowed on the right side compared to the left side       Assessment & Plan:  1.  History of left basal ganglia hemorrhage approximately 2 and half months ago.  He has completed inpatient rehabilitation as well as home health.  I do think he would be a great candidate for outpatient therapy.  He has gotten back to modified independent level but still has some mild weakness on the right side as well as fine motor issues and motor control issues.  His balance is also off.  He continues wear right AFO because of foot and ankle weakness.  Patient has some short-term memory issues as well Will make referral for outpatient PT OT and speech therapy Physical medicine rehab follow-up in 3 months

## 2023-10-26 NOTE — Telephone Encounter (Signed)
 Follow Up:     Patient's wife called. She wanted the nurse to know that the Lopressor is still on patient's medication list. She said he went  to see another doctor today and  was still listed on his medicine list.

## 2023-10-26 NOTE — Telephone Encounter (Signed)
 Spoke with patient and wife and assured her metoprolol is not on current medication list.

## 2023-10-30 ENCOUNTER — Telehealth: Payer: Self-pay

## 2023-10-30 MED ORDER — ASPIRIN 81 MG PO TBEC: 81.0000 mg | DELAYED_RELEASE_TABLET | Freq: Every day | ORAL | Status: DC

## 2023-10-30 NOTE — Telephone Encounter (Signed)
-----   Message from Rossie Muskrat [facility] sent at 10/29/2023  9:37 PM EDT ----- Thanks Janalyn Shy.  Carly, can you let [name] know that Dr Pearlean [name] and I agree re: Aspirin?   Thanks,Cameron ----- Message ----- From: Micki Riley, MD Sent: 10/25/2023   2:48 PM EDT To: Lanier Prude, MD  Agree with aspirin 81 mg daily for now.  If there are recurrent strokes may need Watchman ----- Message ----- From: Lanier Prude, MD Sent: 10/25/2023  11:15 AM EDT To: Micki Riley, MD  Pramod,  I am seeing this mutual patient today.  History of intracranial hemorrhage.  History of atrial flutter now post atrial flutter ablation.  He has had no recurrence of arrhythmia on loop recorder monitor.  I would like to stop his blood thinner at this point but he wanted me to confirm that it is okay from your perspective.  If you confirm, we can call him to make the change. Thanks, Sheria Lang

## 2023-10-30 NOTE — Telephone Encounter (Signed)
 Left message for patient to call back

## 2023-10-30 NOTE — Telephone Encounter (Signed)
 Patient is returning call.

## 2023-10-30 NOTE — Telephone Encounter (Signed)
 Spoke with the patient and he is aware to stop Eliquis and start on Aspirin 81 mg daily

## 2023-10-31 ENCOUNTER — Ambulatory Visit: Attending: Physical Medicine & Rehabilitation | Admitting: Occupational Therapy

## 2023-10-31 ENCOUNTER — Encounter: Payer: Self-pay | Admitting: Physical Therapy

## 2023-10-31 ENCOUNTER — Ambulatory Visit: Admitting: Physical Therapy

## 2023-10-31 ENCOUNTER — Other Ambulatory Visit: Payer: Self-pay

## 2023-10-31 DIAGNOSIS — I610 Nontraumatic intracerebral hemorrhage in hemisphere, subcortical: Secondary | ICD-10-CM | POA: Insufficient documentation

## 2023-10-31 DIAGNOSIS — R41840 Attention and concentration deficit: Secondary | ICD-10-CM | POA: Insufficient documentation

## 2023-10-31 DIAGNOSIS — M6281 Muscle weakness (generalized): Secondary | ICD-10-CM

## 2023-10-31 DIAGNOSIS — R2681 Unsteadiness on feet: Secondary | ICD-10-CM | POA: Insufficient documentation

## 2023-10-31 DIAGNOSIS — R2689 Other abnormalities of gait and mobility: Secondary | ICD-10-CM | POA: Diagnosis present

## 2023-10-31 DIAGNOSIS — R262 Difficulty in walking, not elsewhere classified: Secondary | ICD-10-CM | POA: Insufficient documentation

## 2023-10-31 DIAGNOSIS — R278 Other lack of coordination: Secondary | ICD-10-CM | POA: Insufficient documentation

## 2023-10-31 DIAGNOSIS — R41844 Frontal lobe and executive function deficit: Secondary | ICD-10-CM | POA: Diagnosis present

## 2023-10-31 NOTE — Patient Instructions (Addendum)
  Coordination Activities  Perform the following activities for 10 minutes 1 times per day with right hand(s).  Flip cards 1 at a time  Deal cards with your thumb (Hold deck in hand and push card off top with thumb). Pick up coins and place in container or coin bank. Pick up coins and stack. Pick up coins one at a time until you get 5-10 in your hand, then move coins from palm to fingertips to  place in a container

## 2023-10-31 NOTE — Therapy (Addendum)
 OUTPATIENT OCCUPATIONAL THERAPY NEURO EVALUATION  Patient Name: Adrian [name] MRN: [id number] DOB:1955/10/25, 68 y.o., male Today's Date: 10/31/2023  PCP: Dr. Caryl Never REFERRING PROVIDER: Dr. Wynn Banker  END OF SESSION:  OT End of Session - 10/31/23 1014     Visit Number 1    Number of Visits 25    Date for OT Re-Evaluation 01/23/24    Authorization Type Medicare    Authorization - Visit Number 1    Progress Note Due on Visit 10    OT Start Time 1012    OT Stop Time 1052    OT Time Calculation (min) 40 min    Activity Tolerance Patient tolerated treatment well    Behavior During Therapy [facility] for tasks assessed/performed             Past Medical History:  Diagnosis Date   AF (paroxysmal atrial fibrillation) (HCC) 11/29/2018   Atrial flutter (HCC) 10/12/2015   a. TEE 3/17 with ? LAA clot-->s/p TEE/DCCV 11/24/2015; s/p DCCV 12/2022;s/p ablation 06/2023   CAD (coronary artery disease), native coronary artery 12/06/2015   a. 10/2015 MV: EF  37%, reversible defect inferior apex, intermediate risk findings; b. 10/2015 Cath: 20% mid RCA.   Carotid artery stenosis    1-39% right ICA stenosis and occluded left ICA   Colonic diverticular abscess    Diverticulitis    History of chemotherapy 2005   Cisplatin   Hypothyroidism    NICM (nonischemic cardiomyopathy) (HCC) 10/14/2015   a. Tachy mediated?;  b. Echo 3/17 - Mild concentric LVH, EF 30-35%, anteroseptal, anterior, anterolateral, apical anterior, lateral hypokinesis, trivial MR, mild to moderately reduced RVSF; c. LHC 3/17 - mRCA 20%   Radiation NOv.3,2005-Dec. 15, 2005   6810 cGy in 30 fractions   Tonsil cancer [facility]) 2005   Dr Otila Kluver.  XRT   Past Surgical History:  Procedure Laterality Date   A-FLUTTER ABLATION N/A 06/19/2023   Procedure: A-FLUTTER ABLATION;  Surgeon: Lanier Prude, MD;  Location: [facility] INVASIVE CV LAB;  Service: Cardiovascular;  Laterality: N/A;   APPENDECTOMY N/A 03/07/2019   Procedure: ROBOTIC  ASSISTED APPENDECTOMY;  Surgeon: Karie Soda, MD;  Location: [facility] ORS;  Service: General;  Laterality: N/A;   CARDIAC CATHETERIZATION N/A 10/18/2015   Procedure: Left Heart Cath and Coronary Angiography;  Surgeon: Kathleene Hazel, MD;  Location: [facility] INVASIVE CV LAB;  Service: Cardiovascular;  Laterality: N/A;   CARDIOVERSION N/A 11/24/2015   Procedure: CARDIOVERSION;  Surgeon: Wendall Stade, MD;  Location: [facility] ENDOSCOPY;  Service: Cardiovascular;  Laterality: N/A;   CARDIOVERSION N/A 12/20/2022   Procedure: CARDIOVERSION;  Surgeon: [name], MD;  Location: [facility] INVASIVE CV LAB;  Service: Cardiovascular;  Laterality: N/A;   CYSTOSCOPY WITH STENT PLACEMENT Bilateral 03/07/2019   Procedure: CYSTOSCOPY WITH BILATERAL FIREFLY INJECTION;  Surgeon: Crist Fat, MD;  Location: [facility] ORS;  Service: Urology;  Laterality: Bilateral;   GASTROSTOMY TUBE PLACEMENT  07/04/2004   IR - G tube for tonsilar cancer   IR ANGIO INTRA EXTRACRAN SEL COM CAROTID INNOMINATE UNI R MOD SED  03/09/2019   IR ANGIO VERTEBRAL SEL VERTEBRAL UNI R MOD SED  03/09/2019   IR CT HEAD LTD  03/09/2019   IR PERCUTANEOUS ART THROMBECTOMY/INFUSION INTRACRANIAL INC DIAG ANGIO  03/09/2019   IR RADIOLOGIST EVAL & MGMT  12/18/2018   LAMINECTOMY     C5/placement of steel plate   LOOP RECORDER INSERTION N/A 06/19/2023   Procedure: LOOP RECORDER INSERTION;  Surgeon: Lanier Prude, MD;  Location:  [facility] INVASIVE CV LAB;  Service: Cardiovascular;  Laterality: N/A;   NECK SURGERY  2003   replaced disk   RADIOLOGY WITH ANESTHESIA N/A 03/08/2019   Procedure: IR WITH ANESTHESIA;  Surgeon: Julieanne Cotton, MD;  Location: [facility] OR;  Service: Radiology;  Laterality: N/A;   TEE WITHOUT CARDIOVERSION N/A 10/15/2015   Procedure: TRANSESOPHAGEAL ECHOCARDIOGRAM (TEE);  Surgeon: Laurey Morale, MD;  Location: [facility] ENDOSCOPY;  Service: Cardiovascular;  Laterality: N/A;   TEE WITHOUT CARDIOVERSION N/A 11/24/2015   Procedure: TRANSESOPHAGEAL ECHOCARDIOGRAM  (TEE);  Surgeon: Wendall Stade, MD;  Location: [facility] ENDOSCOPY;  Service: Cardiovascular;  Laterality: N/A;   TEE WITHOUT CARDIOVERSION N/A 12/20/2022   Procedure: TRANSESOPHAGEAL ECHOCARDIOGRAM;  Surgeon: [name], MD;  Location: [facility] INVASIVE CV LAB;  Service: Cardiovascular;  Laterality: N/A;   XI ROBOTIC ASSISTED COLOSTOMY TAKEDOWN N/A 03/07/2019   Procedure: XI ROBOTIC ASSISTED LOW ANTERIOR RESECTION, RIGID PROCTOSCOPY;  Surgeon: Karie Soda, MD;  Location: [facility] ORS;  Service: General;  Laterality: N/A;   Patient Active Problem List   Diagnosis Date Noted   Carotid occlusion, left 10/23/2023   ICH (intracerebral hemorrhage) (HCC) 08/11/2023   Carotid artery stenosis    Atrial flutter (HCC) 12/19/2022   Essential hypertension 04/22/2021   Hypokalemia 03/12/2019   Expressive aphasia 03/10/2019   Dysphagia 03/10/2019   Middle cerebral artery embolism, left 03/09/2019   Diverticular stricture (HCC) 03/07/2019   Bacteria in urine    Chronic atrial fibrillation (HCC) 12/02/2018   Current use of long term anticoagulation 12/02/2018   UTI (urinary tract infection) 11/29/2018   Hyponatremia 11/29/2018   PAF (paroxysmal atrial fibrillation) (HCC) 11/29/2018   Diverticulitis of large intestine with abscess 09/23/2018   Dyslipidemia 12/05/2017   CAD (coronary artery disease), native coronary artery 12/06/2015   SOB (shortness of breath)    NICM (nonischemic cardiomyopathy) (HCC)    Thyroid activity decreased    Hypothyroidism 08/12/2013   History of radiation therapy 05/30/2012   Smokes tobacco daily 05/30/2012   Cancer of tonsillar fossa (HCC) 11/27/2011   Tonsil cancer (HCC) 2005   History of chemotherapy 2005    ONSET DATE: 08/11/23  REFERRING DIAG: I61.0 (ICD-10-CM) - Nontraumatic subcortical hemorrhage of left cerebral hemisphere (HCC)   THERAPY DIAG:  Other lack of coordination - Plan: Ot plan of care cert/re-cert  Muscle weakness (generalized) - Plan: Ot plan of care  cert/re-cert  Other abnormalities of gait and mobility - Plan: Ot plan of care cert/re-cert  Attention and concentration deficit - Plan: Ot plan of care cert/re-cert  Frontal lobe and executive function deficit - Plan: Ot plan of care cert/re-cert  Rationale for Evaluation and Treatment: Rehabilitation  SUBJECTIVE:   SUBJECTIVE STATEMENT: pt reports difficulty using his RUE Pt accompanied by: self  PERTINENT HISTORY:   68 yo male hospitalized 08/11/23 with R sided weakness. CT showed a large basal ganglia ICH with IVH extension. PMH includes:  A-fib, carotid artery stenosis, CAD, tonsil cancer, hypothyroidism, L MCA CVA.    PRECAUTIONS: Fall  WEIGHT BEARING RESTRICTIONS: No  PAIN:  Are you having pain? No  FALLS: Has patient fallen in last 6 months? No  LIVING ENVIRONMENT: Lives with: lives alone has friends and ex wife checks in on him Lives in: House/apartment Stairs: no Has following equipment at home: Single point cane and Tour manager  PLOF: Independent  PATIENT GOALS: improve use of RUE  OBJECTIVE:  Note: Objective measures were completed at Evaluation unless otherwise noted.  HAND DOMINANCE: Right  ADLs: Overall  ADLs: mod I with all basic ADLS Transfers/ambulation related to ADLs: Eating: uses RUE 20%, currently using non dominant LUE,  Grooming: using non dominant LUE UB Dressing: mod I difficulty with buttons LB Dressing: mod I Toileting: mod I with cane Bathing: has tub bench Tub Shower transfers: mod I  Equipment: Transfer tub bench  IADLs: Shopping: needs assist for transportation Light housekeeping: Pt is performing light cleaning, he has occasional assist Meal Prep: microwave meals primarily, does a little stove top  Medication management: pt handles  Financial management: pt handles Handwriting: 90% legibility, handwriting is small   MOBILITY STATUS:  mod I with cane   ACTIVITY TOLERANCE: Activity tolerance: standing     UPPER  EXTREMITY ROM:  grossly WFLS   UPPER EXTREMITY MMT:     MMT Right eval Left eval  Shoulder flexion 3+/5 4+/5  Shoulder abduction    Shoulder adduction    Shoulder extension    Shoulder internal rotation    Shoulder external rotation    Middle trapezius    Lower trapezius    Elbow flexion 4-/5 4-/5  Elbow extension 4-/5 4-/5  Wrist flexion    Wrist extension    Wrist ulnar deviation    Wrist radial deviation    Wrist pronation    Wrist supination    (Blank rows = not tested)  HAND FUNCTION: Grip strength: Right: 50 lbs; Left: 63 lbs  COORDINATION: 9 Hole Peg test: Right: 59.69 sec; Left: 29.35 sec  SENSATION:diminished in RUE    COGNITION: Overall cognitive status:unable to spell WORLD backwards correctly, recalls 2/3 words following a delay.   VISION ASSESSMENT: Not tested- denies visual changes      OBSERVATIONS: Pleasant gentleman motivated to improve                                                                                                                              TREATMENT DATE: 10/31/23- eval         PATIENT EDUCATION: Education details: role of OT, beginning coordination HEP Person educated: Patient Education method: Explanation Education comprehension: verbalized understanding  HOME EXERCISE PROGRAM: beginning coordination 3/26   GOALS: Goals reviewed with patient? Yes  SHORT TERM GOALS: Target date: 12/01/23  I with inital HEP  Goal status: INITIAL  2.  Pt will report feeding himself at least 50% of the time with RUE. Baseline: uses 20% x Goal status: INITIAL  3.  Pt will demonstrate improved RUE fine motor coordination for ADLs as evidenced by decreasing 9 hole peg test score to 52 secs or less  Goal status: INITIAL  4.  Pt will report consistently brushing his teeth with RUE  Goal status: INITIAL  5.  I with memory compensation strategies  Goal status: INITIAL  6.  Pt will report that he has resumed moderate  level home management mod I  Goal status: INITIAL  LONG TERM GOALS: Target date: 01/23/24  I with updated HEP  Goal status: INITIAL  2.  Pt will demonstrate improved RUE fine motor coordination for ADLs as evidenced by decreasing 9 hole peg test score to 48 secs or less  Goal status: INITIAL  3.  Pt will demonstrate ability to carry a plate in RUE without spills.  Goal status: INITIAL  4.  Pt will write a short paragraph with 100% legibility and minimal decrease in letter size.  Goal status: INITIAL  5.  Pt will resume use of RUE as dominant hand at least 75% of the time for ADLs/ IADLs.  Goal status: INITIAL  6. Pt will retrieve a 3 lbs object from eye level shelf using RUE with good control  Goal status: inital  ASSESSMENT:  CLINICAL IMPRESSION: 68 yo male hospitalized 08/11/23 with R sided weakness. CT showed a large basal ganglia ICH with IVH extension. PMH includes:  A-fib, carotid artery stenosis, CAD, tonsil cancer, hypothyroidism, L MCA CVA. Patient presents with the perfroamcne deficits below. He can benefit from skilled occupational therapy to address these deficits in order to maximize pt's safety and I with ADLs/IADLs.  PERFORMANCE DEFICITS: in functional skills including ADLs, IADLs, coordination, dexterity, sensation, strength, flexibility, Fine motor control, Gross motor control, mobility, balance, endurance, decreased knowledge of precautions, decreased knowledge of use of DME, and UE functional use, cognitive skills including attention, memory, problem solving, safety awareness, and thought, and psychosocial skills including coping strategies, environmental adaptation, habits, interpersonal interactions, and routines and behaviors.   IMPAIRMENTS: are limiting patient from ADLs, IADLs, play, leisure, and social participation.   CO-MORBIDITIES: may have co-morbidities  that affects occupational performance. Patient will benefit from skilled OT to address above  impairments and improve overall function.  MODIFICATION OR ASSISTANCE TO COMPLETE EVALUATION: No modification of tasks or assist necessary to complete an evaluation.  OT OCCUPATIONAL PROFILE AND HISTORY: Detailed assessment: Review of records and additional review of physical, cognitive, psychosocial history related to current functional performance.  CLINICAL DECISION MAKING: LOW - limited treatment options, no task modification necessary  REHAB POTENTIAL: Good  EVALUATION COMPLEXITY: Low    PLAN:  OT FREQUENCY: 2x/week  OT DURATION: 12 weeks  PLANNED INTERVENTIONS: 97168 OT Re-evaluation, 97535 self care/ADL training, 84132 therapeutic exercise, 97530 therapeutic activity, 97112 neuromuscular re-education, 97140 manual therapy, 97113 aquatic therapy, 97035 ultrasound, 97018 paraffin, 44010 moist heat, 97010 cryotherapy, 97034 contrast bath, 97014 electrical stimulation unattended, 97129 Cognitive training (first 15 min), 27253 Cognitive training(each additional 15 min), 66440 Orthotics management and training, 34742 Splinting (initial encounter), passive range of motion, balance training, functional mobility training, energy conservation, coping strategies training, patient/family education, and DME and/or AE instructions  RECOMMENDED OTHER SERVICES: n/a  CONSULTED AND AGREED WITH PLAN OF CARE: Patient  PLAN FOR NEXT SESSION: inital HEP, consider cane vs. light theraband, add to coordination HEP   Fonnie Crookshanks, OT 10/31/2023, 2:21 PM

## 2023-10-31 NOTE — Therapy (Signed)
 OUTPATIENT PHYSICAL THERAPY EVALUATION   Patient Name: Adrian [name] MRN: [id number] DOB:21-Nov-1955, 68 y.o., male Today's Date: 10/31/2023  END OF SESSION:  PT End of Session - 10/31/23 1132     Visit Number 1    Number of Visits 17    Date for PT Re-Evaluation 12/26/23    Authorization Type MCR    Authorization Time Period 10/31/23 to 12/26/23    Progress Note Due on Visit 10    PT Start Time 1102    PT Stop Time 1143    PT Time Calculation (min) 41 min    Activity Tolerance Patient tolerated treatment well    Behavior During Therapy [facility] for tasks assessed/performed             Past Medical History:  Diagnosis Date   AF (paroxysmal atrial fibrillation) (HCC) 11/29/2018   Atrial flutter (HCC) 10/12/2015   a. TEE 3/17 with ? LAA clot-->s/p TEE/DCCV 11/24/2015; s/p DCCV 12/2022;s/p ablation 06/2023   CAD (coronary artery disease), native coronary artery 12/06/2015   a. 10/2015 MV: EF  37%, reversible defect inferior apex, intermediate risk findings; b. 10/2015 Cath: 20% mid RCA.   Carotid artery stenosis    1-39% right ICA stenosis and occluded left ICA   Colonic diverticular abscess    Diverticulitis    History of chemotherapy 2005   Cisplatin   Hypothyroidism    NICM (nonischemic cardiomyopathy) (HCC) 10/14/2015   a. Tachy mediated?;  b. Echo 3/17 - Mild concentric LVH, EF 30-35%, anteroseptal, anterior, anterolateral, apical anterior, lateral hypokinesis, trivial MR, mild to moderately reduced RVSF; c. LHC 3/17 - mRCA 20%   Radiation NOv.3,2005-Dec. 15, 2005   6810 cGy in 30 fractions   Tonsil cancer [facility]) 2005   Dr Otila Kluver.  XRT   Past Surgical History:  Procedure Laterality Date   A-FLUTTER ABLATION N/A 06/19/2023   Procedure: A-FLUTTER ABLATION;  Surgeon: Lanier Prude, MD;  Location: [facility] INVASIVE CV LAB;  Service: Cardiovascular;  Laterality: N/A;   APPENDECTOMY N/A 03/07/2019   Procedure: ROBOTIC ASSISTED APPENDECTOMY;  Surgeon: Karie Soda, MD;   Location: [facility] ORS;  Service: General;  Laterality: N/A;   CARDIAC CATHETERIZATION N/A 10/18/2015   Procedure: Left Heart Cath and Coronary Angiography;  Surgeon: Kathleene Hazel, MD;  Location: [facility] INVASIVE CV LAB;  Service: Cardiovascular;  Laterality: N/A;   CARDIOVERSION N/A 11/24/2015   Procedure: CARDIOVERSION;  Surgeon: Wendall Stade, MD;  Location: [facility] ENDOSCOPY;  Service: Cardiovascular;  Laterality: N/A;   CARDIOVERSION N/A 12/20/2022   Procedure: CARDIOVERSION;  Surgeon: [name], MD;  Location: [facility] INVASIVE CV LAB;  Service: Cardiovascular;  Laterality: N/A;   CYSTOSCOPY WITH STENT PLACEMENT Bilateral 03/07/2019   Procedure: CYSTOSCOPY WITH BILATERAL FIREFLY INJECTION;  Surgeon: Crist Fat, MD;  Location: [facility] ORS;  Service: Urology;  Laterality: Bilateral;   GASTROSTOMY TUBE PLACEMENT  07/04/2004   IR - G tube for tonsilar cancer   IR ANGIO INTRA EXTRACRAN SEL COM CAROTID INNOMINATE UNI R MOD SED  03/09/2019   IR ANGIO VERTEBRAL SEL VERTEBRAL UNI R MOD SED  03/09/2019   IR CT HEAD LTD  03/09/2019   IR PERCUTANEOUS ART THROMBECTOMY/INFUSION INTRACRANIAL INC DIAG ANGIO  03/09/2019   IR RADIOLOGIST EVAL & MGMT  12/18/2018   LAMINECTOMY     C5/placement of steel plate   LOOP RECORDER INSERTION N/A 06/19/2023   Procedure: LOOP RECORDER INSERTION;  Surgeon: Lanier Prude, MD;  Location: [facility] INVASIVE CV LAB;  Service: Cardiovascular;  Laterality: N/A;   NECK SURGERY  2003   replaced disk   RADIOLOGY WITH ANESTHESIA N/A 03/08/2019   Procedure: IR WITH ANESTHESIA;  Surgeon: Julieanne Cotton, MD;  Location: [facility] OR;  Service: Radiology;  Laterality: N/A;   TEE WITHOUT CARDIOVERSION N/A 10/15/2015   Procedure: TRANSESOPHAGEAL ECHOCARDIOGRAM (TEE);  Surgeon: Laurey Morale, MD;  Location: [facility] ENDOSCOPY;  Service: Cardiovascular;  Laterality: N/A;   TEE WITHOUT CARDIOVERSION N/A 11/24/2015   Procedure: TRANSESOPHAGEAL ECHOCARDIOGRAM (TEE);  Surgeon: Wendall Stade, MD;  Location: [facility]  ENDOSCOPY;  Service: Cardiovascular;  Laterality: N/A;   TEE WITHOUT CARDIOVERSION N/A 12/20/2022   Procedure: TRANSESOPHAGEAL ECHOCARDIOGRAM;  Surgeon: [name], MD;  Location: [facility] INVASIVE CV LAB;  Service: Cardiovascular;  Laterality: N/A;   XI ROBOTIC ASSISTED COLOSTOMY TAKEDOWN N/A 03/07/2019   Procedure: XI ROBOTIC ASSISTED LOW ANTERIOR RESECTION, RIGID PROCTOSCOPY;  Surgeon: Karie Soda, MD;  Location: [facility] ORS;  Service: General;  Laterality: N/A;   Patient Active Problem List   Diagnosis Date Noted   Carotid occlusion, left 10/23/2023   ICH (intracerebral hemorrhage) (HCC) 08/11/2023   Carotid artery stenosis    Atrial flutter (HCC) 12/19/2022   Essential hypertension 04/22/2021   Hypokalemia 03/12/2019   Expressive aphasia 03/10/2019   Dysphagia 03/10/2019   Middle cerebral artery embolism, left 03/09/2019   Diverticular stricture (HCC) 03/07/2019   Bacteria in urine    Chronic atrial fibrillation (HCC) 12/02/2018   Current use of long term anticoagulation 12/02/2018   UTI (urinary tract infection) 11/29/2018   Hyponatremia 11/29/2018   PAF (paroxysmal atrial fibrillation) (HCC) 11/29/2018   Diverticulitis of large intestine with abscess 09/23/2018   Dyslipidemia 12/05/2017   CAD (coronary artery disease), native coronary artery 12/06/2015   SOB (shortness of breath)    NICM (nonischemic cardiomyopathy) (HCC)    Thyroid activity decreased    Hypothyroidism 08/12/2013   History of radiation therapy 05/30/2012   Smokes tobacco daily 05/30/2012   Cancer of tonsillar fossa (HCC) 11/27/2011   Tonsil cancer (HCC) 2005   History of chemotherapy 2005    PCP: Evelena Peat MD   REFERRING PROVIDER: Erick Colace, MD  REFERRING DIAG: I61.0 (ICD-10-CM) - Nontraumatic subcortical hemorrhage of left cerebral hemisphere [facility])  THERAPY DIAG:  Muscle weakness (generalized)  Difficulty in walking, not elsewhere classified  Unsteadiness on feet  Other  abnormalities of gait and mobility  Rationale for Evaluation and Treatment: Rehabilitation  ONSET DATE: 08/11/23  SUBJECTIVE:   SUBJECTIVE STATEMENT:  Had this 2nd stroke in early January this year, ended up going to inpatient rehab in [place] and then HHPT- have been completely DCed. Biggest concern is coordination in R LE and foot, having to wear a brace/AFO and it helps me a lot. Had been doing HHPT exercises until about 2 weeks ago but stopped bc I didn't feel like they were doing me a lot of good. Found some exercises on the internet I've been using like walking backwards and tandem stance.   PERTINENT HISTORY: 1.  History of left basal ganglia hemorrhage approximately 2 and half months ago.  He has completed inpatient rehabilitation as well as home health.  I do think he would be a great candidate for outpatient therapy.  He has gotten back to modified independent level but still has some mild weakness on the right side as well as fine motor issues and motor control issues.  His balance is also off.  He continues wear right AFO because of foot and ankle weakness.  Patient has some short-term memory issues as well Will make referral for outpatient PT OT and speech therapy Physical medicine rehab follow-up in 3 months PAIN:  Are you having pain? No  PRECAUTIONS: Fall and Other: R hemi, has loop recorder   RED FLAGS: None   WEIGHT BEARING RESTRICTIONS: No  FALLS:  Has patient fallen in last 6 months? No  LIVING ENVIRONMENT: Lives with: lives alone Lives in: House/apartment Stairs: 2 STE with rail  Has following equipment at home: Single point cane and Environmental consultant - 2 wheeled  OCCUPATION: retired- used to work for [employer]   PLOF: Independent, Independent with basic ADLs, Independent with gait, Independent with transfers, and Requires assistive device for independence  PATIENT GOALS: walk straight without a cane, get back to driving and motorcycling   NEXT MD VISIT: Referring  01/25/24  OBJECTIVE:  Note: Objective measures were completed at Evaluation unless otherwise noted.  DIAGNOSTIC FINDINGS:   CLINICAL DATA:  Stroke, follow up Neuro deficit, acute, stroke suspected   EXAM: MRI HEAD WITHOUT AND WITH CONTRAST   TECHNIQUE: Multiplanar, multiecho pulse sequences of the brain and surrounding structures were obtained without and with intravenous contrast.   CONTRAST:  6.52mL GADAVIST GADOBUTROL 1 MMOL/ML IV SOLN   COMPARISON:  CT head from today.   FINDINGS: Brain: When comparing across modalities, no substantial change in intraparenchymal hemorrhage in the left basal ganglia with intraventricular extension. No visible surrounding acute hemorrhage or mass lesion; however, acute blood products limits assessment. Prior left frontal infarct with encephalomalacia. No hydrocephalus. No pathologic enhancement.   Vascular: Major arterial flow voids are maintained at the skull base.   Skull and upper cervical spine: Normal marrow signal.   Sinuses/Orbits: Right frontal sinus opacification. Remaining sinuses are clear. No acute orbital findings.   Other: No mastoid effusions.   IMPRESSION: 1. When comparing across modalities, no substantial change in intraparenchymal hemorrhage in the left basal ganglia with intraventricular extension. No progressive mass effect. 2. No visible surrounding acute hemorrhage or mass lesion; however, acute blood products limits assessment  EXAM: CT HEAD WITHOUT CONTRAST   TECHNIQUE: Contiguous axial images were obtained from the base of the skull through the vertex without intravenous contrast.   RADIATION DOSE REDUCTION: This exam was performed according to the departmental dose-optimization program which includes automated exposure control, adjustment of the mA and/or kV according to patient size and/or use of iterative reconstruction technique.   COMPARISON:  Head CT 08/12/2023   FINDINGS: Brain: Interval  decrease in size of the previously seen hemorrhage in the left basal ganglia, now measuring 10 x 7 mm, previously 20 x 12 mm. There is resolution of intraventricular blood products. No hydrocephalus. No extra-axial fluid collection. No mass effect. No mass lesion. No CT evidence of an acute cortical infarct. There is chronic left MCA territory infarct involving the left opercular region.   Vascular: No hyperdense vessel or unexpected calcification.   Skull: Normal. Negative for fracture or focal lesion.   Sinuses/Orbits: No middle ear or mastoid effusion. Paranasal sinuses are notable for complete opacification of the right frontal sinus. Orbits are unremarkable.   Other: None.   IMPRESSION: No acute intracranial abnormality. Interval decrease in size of left basal ganglia hemorrhage, now measuring 10 x 7 mm, previously 20 x 12 mm. Resolution of intraventricular blood products. No hydrocephalus.    PATIENT SURVEYS:    Patient-Specific Activity Scoring Scheme  "0" represents "unable to perform." "10" represents "able to perform at prior level. 0 1 2  3 4 5 6 7 8 9 10  (Date and Score)   Activity Eval     1. Walking straight without the cane  4     2. Strength in RLE   4    3. Coordinated movements of R LE  3   4.    5.    Score 3.7    Total score = sum of the activity scores/number of activities Minimum detectable change (90%CI) for average score = 2 points Minimum detectable change (90%CI) for single activity score = 3 points     COGNITION: Overall cognitive status: Within functional limits for tasks assessed     SENSATION: Not tested      LOWER EXTREMITY MMT:  MMT Right eval Left eval  Hip flexion 4 5  Hip extension    Hip abduction    Hip adduction    Hip internal rotation    Hip external rotation    Knee flexion 4 5  Knee extension 4 5  Ankle dorsiflexion 1 at best (in AFO, reports no movement in ankle)   Ankle plantarflexion    Ankle  inversion    Ankle eversion     (Blank rows = not tested)    FUNCTIONAL TESTS:  5 times sit to stand: 12.8 seconds some off shift from R LE  Timed up and go (TUG): deferred  3 minute walk test: 251ft [facility]     10/31/23 0001  Standardized Balance Assessment  Standardized Balance Assessment Dynamic Gait Index  Dynamic Gait Index  Level Surface 2  Change in Gait Speed 1  Gait with Horizontal Head Turns 2  Gait with Vertical Head Turns 2  Gait and Pivot Turn 1  Step Over Obstacle 1  Step Around Obstacles 1  Steps 0  Total Score 10    GAIT: Distance walked: 226ft  Assistive device utilized: Single point cane Level of assistance: Modified independence Comments: wide BOS, increased tone in R LE with gait pattern- limited knee ROM and ankle tends to invert a bit , mild unsteadiness with corners, limited rotation at trunk/hips                                                                                                                                 TREATMENT DATE:   10/31/23  Eval, POC, HEP- lots of education on stroke recovery and 6 month window for optimal regains of strength/balance/function, encouraged him to keep doing HEP from HHPT as well as balance work he found for himself, has been able to get on/off and use his TM safely at home so encouraged this as well  Nustep L5 x5 minutes BLEs only          PATIENT EDUCATION:  Education details: exam findings, POC, HEP, otherwise as above  Person educated: Patient Education method: Programmer, multimedia, Demonstration, and Handouts Education comprehension: verbalized understanding, returned demonstration, and needs further education  HOME EXERCISE PROGRAM: TBD  ASSESSMENT:  CLINICAL IMPRESSION: Patient is a 68 y.o. M who was seen today for physical therapy evaluation and treatment for I61.0 (ICD-10-CM) - Nontraumatic subcortical hemorrhage of left cerebral hemisphere [facility]). He is very motivated and moving a very high level  given the hemorrhagic CVA. His main concerns are balance/coordination and getting back to walking without assistive device. Anticipate he will do very well with skilled PT services.   OBJECTIVE IMPAIRMENTS: Abnormal gait, decreased activity tolerance, decreased balance, decreased coordination, decreased knowledge of use of DME, decreased mobility, difficulty walking, decreased strength, and decreased safety awareness.   ACTIVITY LIMITATIONS: standing, squatting, stairs, transfers, and locomotion level  PARTICIPATION LIMITATIONS: driving, shopping, community activity, and yard work  PERSONAL FACTORS: Age, Behavior pattern, Fitness, Past/current experiences, Social background, Time since onset of injury/illness/exacerbation, and Transportation are also affecting patient's functional outcome.   REHAB POTENTIAL: Good  CLINICAL DECISION MAKING: Evolving/moderate complexity  EVALUATION COMPLEXITY: Moderate   GOALS: Goals reviewed with patient? No  SHORT TERM GOALS: Target date: 11/28/2023   Will be compliant with appropriate progressive HEP  Baseline: Goal status: INITIAL  2.  Will score 14/24 on DGI  Baseline:  Goal status: INITIAL  3.  Will be able to perform all functional transfers without compensation strategies or offshift from hemi LE  Baseline:  Goal status: INITIAL  4.  Will be able to ambulate household distances without AD, Mod(I) Baseline:  Goal status: INITIAL    LONG TERM GOALS: Target date: 12/26/2023    MMT in R LE to be at least 4+/5 in hip and knee musculature  Baseline:  Goal status: INITIAL  2.  Will score 18/24 on DGI to show improved functional balance  Baseline:  Goal status: INITIAL  3.  Will be able to walk at least 268ft over uneven/unsteady surfaces without device and no more than S/ 557ft over even surfaces no device Mod(I) Baseline:  Goal status: INITIAL  4.  Will be able to ascend and descend steps without rail and minimal compensation  strategies  Baseline:  Goal status: INITIAL  5.  Will be compliant with advanced HEP vs gym based program at DC to maintain functional gains  Baseline:  Goal status: INITIAL  6.  PSFS to improve by at least 3 points  Baseline:  Goal status: INITIAL   PLAN:  PT FREQUENCY: 2x/week  PT DURATION: 8 weeks  PLANNED INTERVENTIONS: [id number]- PT Re-evaluation, 97110-Therapeutic exercises, 97530- Therapeutic activity, 97112- Neuromuscular re-education, 97535- Self Care, [id number]- Manual therapy, [id number]- Gait training, [phone]- Orthotic Fit/training, [phone]- Aquatic Therapy, Stair training, Taping, Dry Needling, Cryotherapy, and Moist heat  PLAN FOR NEXT SESSION: strength, balance, gait training, functional conditioning. Needs HEP   Nedra Hai, PT, DPT 10/31/23 11:56 AM

## 2023-10-31 NOTE — Progress Notes (Signed)
   10/31/23 0001  Standardized Balance Assessment  Standardized Balance Assessment Dynamic Gait Index  Dynamic Gait Index  Level Surface 2  Change in Gait Speed 1  Gait with Horizontal Head Turns 2  Gait with Vertical Head Turns 2  Gait and Pivot Turn 1  Step Over Obstacle 1  Step Around Obstacles 1  Steps 0  Total Score 10

## 2023-11-05 ENCOUNTER — Ambulatory Visit

## 2023-11-05 DIAGNOSIS — R2681 Unsteadiness on feet: Secondary | ICD-10-CM

## 2023-11-05 DIAGNOSIS — R262 Difficulty in walking, not elsewhere classified: Secondary | ICD-10-CM

## 2023-11-05 DIAGNOSIS — R2689 Other abnormalities of gait and mobility: Secondary | ICD-10-CM

## 2023-11-05 DIAGNOSIS — M6281 Muscle weakness (generalized): Secondary | ICD-10-CM

## 2023-11-05 DIAGNOSIS — R278 Other lack of coordination: Secondary | ICD-10-CM

## 2023-11-05 DIAGNOSIS — I610 Nontraumatic intracerebral hemorrhage in hemisphere, subcortical: Secondary | ICD-10-CM

## 2023-11-05 NOTE — Therapy (Signed)
 OUTPATIENT PHYSICAL THERAPY TREATMENT   Patient Name: Adrian [name] MRN: [id number] DOB:08-Apr-1956, 68 y.o., male Today's Date: 11/05/2023  END OF SESSION:  PT End of Session - 11/05/23 1441     Visit Number 2    Number of Visits 17    Date for PT Re-Evaluation 12/26/23    Authorization Type MCR    Authorization Time Period 10/31/23 to 12/26/23    Progress Note Due on Visit 10    PT Start Time 1445    PT Stop Time 1530    PT Time Calculation (min) 45 min    Activity Tolerance Patient tolerated treatment well    Behavior During Therapy [facility] for tasks assessed/performed              Past Medical History:  Diagnosis Date   AF (paroxysmal atrial fibrillation) (HCC) 11/29/2018   Atrial flutter (HCC) 10/12/2015   a. TEE 3/17 with ? LAA clot-->s/p TEE/DCCV 11/24/2015; s/p DCCV 12/2022;s/p ablation 06/2023   CAD (coronary artery disease), native coronary artery 12/06/2015   a. 10/2015 MV: EF  37%, reversible defect inferior apex, intermediate risk findings; b. 10/2015 Cath: 20% mid RCA.   Carotid artery stenosis    1-39% right ICA stenosis and occluded left ICA   Colonic diverticular abscess    Diverticulitis    History of chemotherapy 2005   Cisplatin   Hypothyroidism    NICM (nonischemic cardiomyopathy) (HCC) 10/14/2015   a. Tachy mediated?;  b. Echo 3/17 - Mild concentric LVH, EF 30-35%, anteroseptal, anterior, anterolateral, apical anterior, lateral hypokinesis, trivial MR, mild to moderately reduced RVSF; c. LHC 3/17 - mRCA 20%   Radiation NOv.3,2005-Dec. 15, 2005   6810 cGy in 30 fractions   Tonsil cancer [facility]) 2005   Dr Otila Kluver.  XRT   Past Surgical History:  Procedure Laterality Date   A-FLUTTER ABLATION N/A 06/19/2023   Procedure: A-FLUTTER ABLATION;  Surgeon: Lanier Prude, MD;  Location: [facility] INVASIVE CV LAB;  Service: Cardiovascular;  Laterality: N/A;   APPENDECTOMY N/A 03/07/2019   Procedure: ROBOTIC ASSISTED APPENDECTOMY;  Surgeon: Karie Soda, MD;   Location: [facility] ORS;  Service: General;  Laterality: N/A;   CARDIAC CATHETERIZATION N/A 10/18/2015   Procedure: Left Heart Cath and Coronary Angiography;  Surgeon: Kathleene Hazel, MD;  Location: [facility] INVASIVE CV LAB;  Service: Cardiovascular;  Laterality: N/A;   CARDIOVERSION N/A 11/24/2015   Procedure: CARDIOVERSION;  Surgeon: Wendall Stade, MD;  Location: [facility] ENDOSCOPY;  Service: Cardiovascular;  Laterality: N/A;   CARDIOVERSION N/A 12/20/2022   Procedure: CARDIOVERSION;  Surgeon: [name], MD;  Location: [facility] INVASIVE CV LAB;  Service: Cardiovascular;  Laterality: N/A;   CYSTOSCOPY WITH STENT PLACEMENT Bilateral 03/07/2019   Procedure: CYSTOSCOPY WITH BILATERAL FIREFLY INJECTION;  Surgeon: Crist Fat, MD;  Location: [facility] ORS;  Service: Urology;  Laterality: Bilateral;   GASTROSTOMY TUBE PLACEMENT  07/04/2004   IR - G tube for tonsilar cancer   IR ANGIO INTRA EXTRACRAN SEL COM CAROTID INNOMINATE UNI R MOD SED  03/09/2019   IR ANGIO VERTEBRAL SEL VERTEBRAL UNI R MOD SED  03/09/2019   IR CT HEAD LTD  03/09/2019   IR PERCUTANEOUS ART THROMBECTOMY/INFUSION INTRACRANIAL INC DIAG ANGIO  03/09/2019   IR RADIOLOGIST EVAL & MGMT  12/18/2018   LAMINECTOMY     C5/placement of steel plate   LOOP RECORDER INSERTION N/A 06/19/2023   Procedure: LOOP RECORDER INSERTION;  Surgeon: Lanier Prude, MD;  Location: [facility] INVASIVE CV LAB;  Service:  Cardiovascular;  Laterality: N/A;   NECK SURGERY  2003   replaced disk   RADIOLOGY WITH ANESTHESIA N/A 03/08/2019   Procedure: IR WITH ANESTHESIA;  Surgeon: Julieanne Cotton, MD;  Location: [facility] OR;  Service: Radiology;  Laterality: N/A;   TEE WITHOUT CARDIOVERSION N/A 10/15/2015   Procedure: TRANSESOPHAGEAL ECHOCARDIOGRAM (TEE);  Surgeon: Laurey Morale, MD;  Location: [facility] ENDOSCOPY;  Service: Cardiovascular;  Laterality: N/A;   TEE WITHOUT CARDIOVERSION N/A 11/24/2015   Procedure: TRANSESOPHAGEAL ECHOCARDIOGRAM (TEE);  Surgeon: Wendall Stade, MD;  Location: [facility]  ENDOSCOPY;  Service: Cardiovascular;  Laterality: N/A;   TEE WITHOUT CARDIOVERSION N/A 12/20/2022   Procedure: TRANSESOPHAGEAL ECHOCARDIOGRAM;  Surgeon: [name], MD;  Location: [facility] INVASIVE CV LAB;  Service: Cardiovascular;  Laterality: N/A;   XI ROBOTIC ASSISTED COLOSTOMY TAKEDOWN N/A 03/07/2019   Procedure: XI ROBOTIC ASSISTED LOW ANTERIOR RESECTION, RIGID PROCTOSCOPY;  Surgeon: Karie Soda, MD;  Location: [facility] ORS;  Service: General;  Laterality: N/A;   Patient Active Problem List   Diagnosis Date Noted   Carotid occlusion, left 10/23/2023   ICH (intracerebral hemorrhage) (HCC) 08/11/2023   Carotid artery stenosis    Atrial flutter (HCC) 12/19/2022   Essential hypertension 04/22/2021   Hypokalemia 03/12/2019   Expressive aphasia 03/10/2019   Dysphagia 03/10/2019   Middle cerebral artery embolism, left 03/09/2019   Diverticular stricture (HCC) 03/07/2019   Bacteria in urine    Chronic atrial fibrillation (HCC) 12/02/2018   Current use of long term anticoagulation 12/02/2018   UTI (urinary tract infection) 11/29/2018   Hyponatremia 11/29/2018   PAF (paroxysmal atrial fibrillation) (HCC) 11/29/2018   Diverticulitis of large intestine with abscess 09/23/2018   Dyslipidemia 12/05/2017   CAD (coronary artery disease), native coronary artery 12/06/2015   SOB (shortness of breath)    NICM (nonischemic cardiomyopathy) (HCC)    Thyroid activity decreased    Hypothyroidism 08/12/2013   History of radiation therapy 05/30/2012   Smokes tobacco daily 05/30/2012   Cancer of tonsillar fossa (HCC) 11/27/2011   Tonsil cancer (HCC) 2005   History of chemotherapy 2005    PCP: Evelena Peat MD   REFERRING PROVIDER: Erick Colace, MD  REFERRING DIAG: I61.0 (ICD-10-CM) - Nontraumatic subcortical hemorrhage of left cerebral hemisphere [facility])  THERAPY DIAG:  Muscle weakness (generalized)  Difficulty in walking, not elsewhere classified  Unsteadiness on feet  Other  abnormalities of gait and mobility  Other lack of coordination  Nontraumatic subcortical hemorrhage of left cerebral hemisphere [facility])  Rationale for Evaluation and Treatment: Rehabilitation  ONSET DATE: 08/11/23  SUBJECTIVE:   SUBJECTIVE STATEMENT: Doing fine, no pain. 2 weeks I quit doing exercises because it didn't seem to be doing me good. The balance is still not there.   PERTINENT HISTORY: 1.  History of left basal ganglia hemorrhage approximately 2 and half months ago.  He has completed inpatient rehabilitation as well as home health.  I do think he would be a great candidate for outpatient therapy.  He has gotten back to modified independent level but still has some mild weakness on the right side as well as fine motor issues and motor control issues.  His balance is also off.  He continues wear right AFO because of foot and ankle weakness.  Patient has some short-term memory issues as well Will make referral for outpatient PT OT and speech therapy Physical medicine rehab follow-up in 3 months  PAIN:  Are you having pain? No  PRECAUTIONS: Fall and Other: R hemi, has loop recorder  RED FLAGS: None   WEIGHT BEARING RESTRICTIONS: No  FALLS:  Has patient fallen in last 6 months? No  LIVING ENVIRONMENT: Lives with: lives alone Lives in: House/apartment Stairs: 2 STE with rail  Has following equipment at home: Single point cane and Environmental consultant - 2 wheeled  OCCUPATION: retired- used to work for [employer]   PLOF: Independent, Independent with basic ADLs, Independent with gait, Independent with transfers, and Requires assistive device for independence  PATIENT GOALS: walk straight without a cane, get back to driving and motorcycling   NEXT MD VISIT: Referring 01/25/24  OBJECTIVE:  Note: Objective measures were completed at Evaluation unless otherwise noted.  DIAGNOSTIC FINDINGS:   CLINICAL DATA:  Stroke, follow up Neuro deficit, acute, stroke suspected   EXAM: MRI HEAD  WITHOUT AND WITH CONTRAST   TECHNIQUE: Multiplanar, multiecho pulse sequences of the brain and surrounding structures were obtained without and with intravenous contrast.   CONTRAST:  6.42mL GADAVIST GADOBUTROL 1 MMOL/ML IV SOLN   COMPARISON:  CT head from today.   FINDINGS: Brain: When comparing across modalities, no substantial change in intraparenchymal hemorrhage in the left basal ganglia with intraventricular extension. No visible surrounding acute hemorrhage or mass lesion; however, acute blood products limits assessment. Prior left frontal infarct with encephalomalacia. No hydrocephalus. No pathologic enhancement.   Vascular: Major arterial flow voids are maintained at the skull base.   Skull and upper cervical spine: Normal marrow signal.   Sinuses/Orbits: Right frontal sinus opacification. Remaining sinuses are clear. No acute orbital findings.   Other: No mastoid effusions.   IMPRESSION: 1. When comparing across modalities, no substantial change in intraparenchymal hemorrhage in the left basal ganglia with intraventricular extension. No progressive mass effect. 2. No visible surrounding acute hemorrhage or mass lesion; however, acute blood products limits assessment  EXAM: CT HEAD WITHOUT CONTRAST   TECHNIQUE: Contiguous axial images were obtained from the base of the skull through the vertex without intravenous contrast.   RADIATION DOSE REDUCTION: This exam was performed according to the departmental dose-optimization program which includes automated exposure control, adjustment of the mA and/or kV according to patient size and/or use of iterative reconstruction technique.   COMPARISON:  Head CT 08/12/2023   FINDINGS: Brain: Interval decrease in size of the previously seen hemorrhage in the left basal ganglia, now measuring 10 x 7 mm, previously 20 x 12 mm. There is resolution of intraventricular blood products. No hydrocephalus. No extra-axial fluid  collection. No mass effect. No mass lesion. No CT evidence of an acute cortical infarct. There is chronic left MCA territory infarct involving the left opercular region.   Vascular: No hyperdense vessel or unexpected calcification.   Skull: Normal. Negative for fracture or focal lesion.   Sinuses/Orbits: No middle ear or mastoid effusion. Paranasal sinuses are notable for complete opacification of the right frontal sinus. Orbits are unremarkable.   Other: None.   IMPRESSION: No acute intracranial abnormality. Interval decrease in size of left basal ganglia hemorrhage, now measuring 10 x 7 mm, previously 20 x 12 mm. Resolution of intraventricular blood products. No hydrocephalus.    PATIENT SURVEYS:    Patient-Specific Activity Scoring Scheme  "0" represents "unable to perform." "10" represents "able to perform at prior level. 0 1 2 3 4 5 6 7 8 9  10 (Date and Score)   Activity Eval     1. Walking straight without the cane  4     2. Strength in RLE   4    3. Coordinated movements  of R LE  3   4.    5.    Score 3.7    Total score = sum of the activity scores/number of activities Minimum detectable change (90%CI) for average score = 2 points Minimum detectable change (90%CI) for single activity score = 3 points     COGNITION: Overall cognitive status: Within functional limits for tasks assessed     SENSATION: Not tested      LOWER EXTREMITY MMT:  MMT Right eval Left eval  Hip flexion 4 5  Hip extension    Hip abduction    Hip adduction    Hip internal rotation    Hip external rotation    Knee flexion 4 5  Knee extension 4 5  Ankle dorsiflexion 1 at best (in AFO, reports no movement in ankle)   Ankle plantarflexion    Ankle inversion    Ankle eversion     (Blank rows = not tested)    FUNCTIONAL TESTS:  5 times sit to stand: 12.8 seconds some off shift from R LE  Timed up and go (TUG): deferred  3 minute walk test: 234ft [facility]     10/31/23  0001  Standardized Balance Assessment  Standardized Balance Assessment Dynamic Gait Index  Dynamic Gait Index  Level Surface 2  Change in Gait Speed 1  Gait with Horizontal Head Turns 2  Gait with Vertical Head Turns 2  Gait and Pivot Turn 1  Step Over Obstacle 1  Step Around Obstacles 1  Steps 0  Total Score 10    GAIT: Distance walked: 234ft  Assistive device utilized: Single point cane Level of assistance: Modified independence Comments: wide BOS, increased tone in R LE with gait pattern- limited knee ROM and ankle tends to invert a bit , mild unsteadiness with corners, limited rotation at trunk/hips                                                                                                                                 TREATMENT DATE:  11/05/23 NuStep L5x88mins, did just LE after 3 mins  Step ups 4" Leg ext 3# 2x10 HS curls red 2x10 Rocker board  Side steps on beam STS x10 then with OHP   10/31/23  Eval, POC, HEP- lots of education on stroke recovery and 6 month window for optimal regains of strength/balance/function, encouraged him to keep doing HEP from HHPT as well as balance work he found for himself, has been able to get on/off and use his TM safely at home so encouraged this as well  Nustep L5 x5 minutes BLEs only          PATIENT EDUCATION:  Education details: exam findings, POC, HEP, otherwise as above  Person educated: Patient Education method: Programmer, multimedia, Demonstration, and Handouts Education comprehension: verbalized understanding, returned demonstration, and needs further education  HOME EXERCISE PROGRAM: LAQ, tandem stance, marching, hip abduction, backwards walking   ASSESSMENT:  CLINICAL IMPRESSION: Patient is a 68 y.o. M who was seen today for physical therapy evaluation and treatment for I61.0 (ICD-10-CM) - Nontraumatic subcortical hemorrhage of left cerebral hemisphere [facility]). He is very motivated and moving a very high level given  the hemorrhagic CVA. His main concerns are balance/coordination and getting back to walking without assistive device. Anticipate he will do very well with skilled PT services.   OBJECTIVE IMPAIRMENTS: Abnormal gait, decreased activity tolerance, decreased balance, decreased coordination, decreased knowledge of use of DME, decreased mobility, difficulty walking, decreased strength, and decreased safety awareness.   ACTIVITY LIMITATIONS: standing, squatting, stairs, transfers, and locomotion level  PARTICIPATION LIMITATIONS: driving, shopping, community activity, and yard work  PERSONAL FACTORS: Age, Behavior pattern, Fitness, Past/current experiences, Social background, Time since onset of injury/illness/exacerbation, and Transportation are also affecting patient's functional outcome.   REHAB POTENTIAL: Good  CLINICAL DECISION MAKING: Evolving/moderate complexity  EVALUATION COMPLEXITY: Moderate   GOALS: Goals reviewed with patient? No  SHORT TERM GOALS: Target date: 11/28/2023   Will be compliant with appropriate progressive HEP  Baseline: Goal status: INITIAL  2.  Will score 14/24 on DGI  Baseline: 10 Goal status: INITIAL  3.  Will be able to perform all functional transfers without compensation strategies or offshift from hemi LE  Baseline:  Goal status: INITIAL  4.  Will be able to ambulate household distances without AD, Mod(I) Baseline:  Goal status: INITIAL    LONG TERM GOALS: Target date: 12/26/2023    MMT in R LE to be at least 4+/5 in hip and knee musculature  Baseline:  Goal status: INITIAL  2.  Will score 18/24 on DGI to show improved functional balance  Baseline:  Goal status: INITIAL  3.  Will be able to walk at least 232ft over uneven/unsteady surfaces without device and no more than S/ 555ft over even surfaces no device Mod(I) Baseline:  Goal status: INITIAL  4.  Will be able to ascend and descend steps without rail and minimal compensation  strategies  Baseline:  Goal status: INITIAL  5.  Will be compliant with advanced HEP vs gym based program at DC to maintain functional gains  Baseline:  Goal status: INITIAL  6.  PSFS to improve by at least 3 points  Baseline: 3.7 Goal status: INITIAL   PLAN:  PT FREQUENCY: 2x/week  PT DURATION: 8 weeks  PLANNED INTERVENTIONS: 97164- PT Re-evaluation, 97110-Therapeutic exercises, 97530- Therapeutic activity, 97112- Neuromuscular re-education, 97535- Self Care, 16109- Manual therapy, [id number]- Gait training, [phone]- Orthotic Fit/training, [phone]- Aquatic Therapy, Stair training, Taping, Dry Needling, Cryotherapy, and Moist heat  PLAN FOR NEXT SESSION: strength, balance, gait training, functional conditioning. Needs HEP   Nedra Hai, PT, DPT 11/05/23 3:27 PM

## 2023-11-06 ENCOUNTER — Encounter: Payer: Self-pay | Admitting: Neurology

## 2023-11-06 ENCOUNTER — Ambulatory Visit (INDEPENDENT_AMBULATORY_CARE_PROVIDER_SITE_OTHER): Payer: Medicare Other | Admitting: Neurology

## 2023-11-06 VITALS — BP 130/79 | HR 70 | Ht 68.0 in | Wt 145.0 lb

## 2023-11-06 DIAGNOSIS — G2581 Restless legs syndrome: Secondary | ICD-10-CM | POA: Diagnosis not present

## 2023-11-06 DIAGNOSIS — Z8679 Personal history of other diseases of the circulatory system: Secondary | ICD-10-CM

## 2023-11-06 DIAGNOSIS — I610 Nontraumatic intracerebral hemorrhage in hemisphere, subcortical: Secondary | ICD-10-CM | POA: Diagnosis not present

## 2023-11-06 MED ORDER — GABAPENTIN 300 MG PO CAPS: 300.0000 mg | ORAL_CAPSULE | Freq: Every day | ORAL | 3 refills | Status: DC

## 2023-11-06 NOTE — Progress Notes (Signed)
 Carelink Summary Report / Loop Recorder

## 2023-11-06 NOTE — Progress Notes (Signed)
 [facility] [address] [place]. [place] [address] [phone]       OFFICE FOLLOW-UP NOTE  [name] Date of Birth:  [birth year] Medical Record Number:  [id number]   HPI: Adrian [name] is a 68 year old Caucasian male seen today for office follow-up visit following recent admission for intracerebral hemorrhage January 2025.  Is accompanied by his wife.  History is obtained from them and review of electronic medical records.  I personally reviewed pertinent available imaging films in PACS.Adrian [name] is a 68 y.o. male with history of atrial fibrillation, on Eliquis, carotid artery stenosis, hypothyroidism who presented 1/4 with acute onset of right-sided weakness.  Patient called EMS and he was brought in as a code stroke.  Noted to be hypertensive up to 230 systolic per EMS. On exam at bridge patient exhibited right-sided facial droop, right arm and leg weakness, dysarthria.CT head showed a large basal ganglia ICH with IVH extension.CTA showed spot sign and blood pressure parameters were tightened to a goal of 110-130.Patient was admitted to 4 N. ICU for monitoring and strict blood pressure control.  ICH score was 1 on admission.  His Eliquis was reversed with Andexxa.  Blood pressure was tightly controlled.  MRI scan showed stable appearance of the basal ganglia hemorrhage without significant intra ventricular extension as hydrocephalus or mass effect.  2D echo showed ejection fraction of 60 to 65% with no LVH.  LDL cholesterol 117 mg percent.  Hemoglobin A1c was 5.6.  Patient was advised not to take any antithrombotics and to follow-up with me as an outpatient and to consider possible participation in the ASPIRE trial.  Patient already was started on Eliquis as an outpatient and recently saw her cardiologist Dr. Lalla Brothers who questioned the safety and risk-benefit and patient is here today to see me for my opinion.  Patient is currently on  Eliquis 5 mg twice daily.  And  tolerating it well without bruising or bleeding he continues to have right-sided weakness but it is improving.  He is able to use his right upper extremity fairly well.  He still has mild right foot drop and drags his right leg while walking.  He has finished 6 weeks of home physical occupational speech therapy and has started outpatient therapies last week.  He is able to do almost all activities of daily living for himself. Prior office notes 01/15/2020, Adrian [name] is being seen for follow-up regarding left MCA stroke in 03/2019.  He has been doing well since prior visit with improvement of his speech and only occasional word finding difficulty.  Denies new or worsening stroke/TIA symptoms.  He continues to work for [employer] without difficulty.  Continues on Eliquis for secondary stroke prevention and atrial fibrillation as well as atorvastatin without side effects. Recent lab work showed LDL 50. Blood pressure today 134/75.  He continues to follow closely with PCP and cardiology. No stroke or neurological concerns at this time.     History provided for reference purposes only Update 07/15/2019 JM: Adrian [name] is a 68 year old male who is being seen today for stroke follow-up.  Residual deficits occasional speech hesitancy or difficulty finding the correct word but overall improving.  He has also continued to experience decreased energy levels and decreased motivation but this has been slightly improving.  He has returned back to work without great difficulty working 7 to 8 hours/day.  He denies insomnia or snoring.  He has actually been working on weight gain and has gained  5 pounds since prior visit.  Continues on Eliquis for history of atrial fibrillation and secondary stroke prevention without bleeding or bruising.  Continues on atorvastatin without myalgias.  Blood pressure today 122/78.  No further concerns at this time.   Initial visit 04/15/2019 Dr. Pearlean [name]: Adrian [name] is a 68 year old Caucasian male seen today for  initial office follow-up visit following hospital admission for stroke in August 2020.  History is obtained from the patient, his ex wife, review of electronic medical records and have personally reviewed imaging films in PACS.  He has past medical history of coronary artery disease, tonsillar cancer in remission, atrial fibrillation on anticoagulation which was held 5 days ago prior to admission for diverticulitis of large intestine with abscess requiring exploratory laparotomy with low anterior resection, rigid proctoscopy and appendicectomy.  He was last seen normal on 03/08/2019 at 7:30 PM and was still on hospital and he developed sudden onset of speech difficulties and speech became nonsensical he had trouble expressing himself.  Code stroke was activated his NIH stroke scale was 2 on assessment there.  Noncontrast CT scan of the head was negative for bleed TPA was not given due to recent surgery 1 day prior.  CT angiogram of the head and neck was obtained which showed left cervical carotid occlusion as well as left MCA occlusion and CT perfusion showed acute cord infarct involving the anterior left MCA distribution with moderate surrounding ischemic penumbra favorable for revascularization.  Patient was transferred to [facility] where he underwent mechanical thrombectomy of the occluded left ICA with revascularization but only partial revascularization of the left MCA.  MRI scan of the brain showed moderate size left MCA infarct with trace petechial hemorrhage with decreased left ICA flow void.  2D echo showed normal ejection fraction.  LDL cholesterol 46 mg percent.  Hemoglobin A1c was 5.5.  Patient is started on Eliquis for his history of atrial fibrillation.  He was discharged home with outpatient physical occupational and speech therapy.     Patient states he is continuing therapies but is currently getting only speech and occupational therapy 1 day a week.  He still has some word finding  difficulties particularly towards afternoon when he is tired he struggles to speak.  He is currently out on short-term disability to October 23 from his diverticulitis.  He wants to go back to work and will be working part-time 6 hours/day.  He does not involve a lot of speaking and talking in his job.  He is tolerating Eliquis well without bruising or bleeding.  His blood pressure is well controlled and today it is 124/74.  He is tolerating Lipitor without muscle aches and pains.  Patient continues to smoke but states he is willing to quit soon.  He wants to return back to work on 05/25/2019.  ROS:   14 system review of systems is positive for weakness, negative walking, stiffness, bruising all other systems negative  PMH:  Past Medical History:  Diagnosis Date   AF (paroxysmal atrial fibrillation) (HCC) 11/29/2018   Atrial flutter (HCC) 10/12/2015   a. TEE 3/17 with ? LAA clot-->s/p TEE/DCCV 11/24/2015; s/p DCCV 12/2022;s/p ablation 06/2023   CAD (coronary artery disease), native coronary artery 12/06/2015   a. 10/2015 MV: EF  37%, reversible defect inferior apex, intermediate risk findings; b. 10/2015 Cath: 20% mid RCA.   Carotid artery stenosis    1-39% right ICA stenosis and occluded left ICA   Colonic diverticular abscess    Diverticulitis  History of chemotherapy 2005   Cisplatin   Hypothyroidism    NICM (nonischemic cardiomyopathy) (HCC) 10/14/2015   a. Tachy mediated?;  b. Echo 3/17 - Mild concentric LVH, EF 30-35%, anteroseptal, anterior, anterolateral, apical anterior, lateral hypokinesis, trivial MR, mild to moderately reduced RVSF; c. LHC 3/17 - mRCA 20%   Radiation NOv.3,2005-Dec. 15, 2005   6810 cGy in 30 fractions   Tonsil cancer [facility]) 2005   Dr Otila Kluver.  XRT    Social History:  Social History   Socioeconomic History   Marital status: Married    Spouse name: Not on file   Number of children: Not on file   Years of education: Not on file   Highest education level:  Not on file  Occupational History   Not on file  Tobacco Use   Smoking status: Former    Current packs/day: 0.00    Average packs/day: 0.5 packs/day for 20.0 years (10.0 ttl pk-yrs)    Types: Cigarettes    Quit date: 08/11/2023    Years since quitting: 0.2   Smokeless tobacco: Never   Tobacco comments:    1/2 pack per day  Vaping Use   Vaping status: Never Used  Substance and Sexual Activity   Alcohol use: Yes    Alcohol/week: 12.0 standard drinks of alcohol    Types: 12 Cans of beer per week    Comment: couple of beers each day   Drug use: No   Sexual activity: Not on file  Other Topics Concern   Not on file  Social History Narrative   Not on file   Social Drivers of Health   Financial Resource Strain: Not on file  Food Insecurity: No Food Insecurity (08/11/2023)   Hunger Vital Sign    Worried About Running Out of Food in the Last Year: Never true    Ran Out of Food in the Last Year: Never true  Transportation Needs: No Transportation Needs (08/11/2023)   PRAPARE - Administrator, Civil Service (Medical): No    Lack of Transportation (Non-Medical): No  Physical Activity: Not on file  Stress: Not on file  Social Connections: Moderately Integrated (08/11/2023)   Social Connection and Isolation Panel [NHANES]    Frequency of Communication with Friends and Family: Three times a week    Frequency of Social Gatherings with Friends and Family: Once a week    Attends Religious Services: 1 to 4 times per year    Active Member of [employer] or Organizations: Yes    Attends Banker Meetings: 1 to 4 times per year    Marital Status: Divorced  Catering manager Violence: Not At Risk (08/11/2023)   Humiliation, Afraid, Rape, and Kick questionnaire    Fear of Current or Ex-Partner: No    Emotionally Abused: No    Physically Abused: No    Sexually Abused: No    Medications:   Current Outpatient Medications on File Prior to Visit  Medication Sig Dispense Refill    amLODipine (NORVASC) 2.5 MG tablet Take 1 tablet (2.5 mg total) by mouth daily. 90 tablet 1   aspirin EC 81 MG tablet Take 1 tablet (81 mg total) by mouth daily. Swallow whole.     atorvastatin (LIPITOR) 80 MG tablet Take 1 tablet (80 mg total) by mouth daily. 90 tablet 3   levothyroxine (SYNTHROID) 137 MCG tablet Take 1 tablet (137 mcg total) by mouth daily before breakfast. 90 tablet 0   No current facility-administered medications on  file prior to visit.    Allergies:  No Known Allergies  Physical Exam General: well developed, well nourished, seated, in no evident distress Head: head normocephalic and atraumatic.  Neck: supple with no carotid or supraclavicular bruits Cardiovascular: regular rate and rhythm, no murmurs Musculoskeletal: no deformity Skin:  no rash/petichiae Vascular:  Normal pulses all extremities Vitals:   11/06/23 1033  BP: 130/79  Pulse: 70   Neurologic Exam Mental Status: Awake and fully alert. Oriented to place and time. Recent and remote memory intact. Attention span, concentration and fund of knowledge appropriate. Mood and affect appropriate.  Cranial Nerves: Fundoscopic exam reveals sharp disc margins. Pupils equal, briskly reactive to light. Extraocular movements full without nystagmus. Visual fields full to confrontation. Hearing intact. Facial sensation intact.  Mild right lower facial weakness.  Tongue, palate moves normally and symmetrically.  Motor: Spastic right hemiparesis with 4/5 strength with weakness of right grip and intrinsic hand muscles.  Orbits left or right upper extremity.  Mild weakness of right hip flexors and ankle dorsiflexors.  Tone is increased on the right compared to the left.   Sensory.: intact to touch ,pinprick .position and vibratory sensation.  Coordination: Rapid alternating movements normal in all extremities. Finger-to-nose and heel-to-shin performed accurately bilaterally. Gait and Station: Arises from chair without  difficulty. Stance is normal. Gait is spastic hemiplegic with circumduction.  With right foot drop.  Unable to walk tandem. Reflexes: 2+ and asymmetric and brisker on the right. Toes downgoing.   NIHSS  1 Modified Rankin  2   ASSESSMENT: 68 year old Caucasian male with remote history of left MCA branch infarct in August 2020 due to left ICA occlusion treated with mechanical thrombectomy with revascularization.  History of atrial fibrillation was on long-term Eliquis but developed left basal ganglia intracerebral hemorrhage in January 2025 now has residual spastic hemiparesis.  Vascular risk factors of atrial fibrillation, smoking and hyperlipidemia.     PLAN:I had a long patient with the patient and his wife regarding his recent intracerebral hemorrhage related to hypertension and being on Eliquis for his A-fib and discuss current equipoise as to whether to resume anticoagulation just continue aspirin and discussed risk-benefit of each.  We also discussed possible participation in the ASPIRE in which patient will be randomized to either aspirin or Eliquis and patient appears interested and we will give him information to review at home and decide.  I recommend he is discontinue Eliquis for now and stay on aspirin alone till he makes a decision about participation in the study.  Continue ongoing physical occupational and speech therapy.  Trial of gabapentin 300 mg at night to help with restless leg syndrome.  Return for follow-up in the future in 6 months or call earlier if necessary.  Greater than 50% of time during this 40 minute visit was spent on counseling,explanation of diagnosis intracerebral hemorrhage on Eliquis, atrial fibrillation and stroke risk and discussion about risk-benefit of resuming Eliquis versus continuing aspirin participating in the ASPIRE stroke prevention study., planning of further management, discussion with patient and family and coordination of care Delia Heady, MD Note:  This document was prepared with digital dictation and possible smart phrase technology. Any transcriptional errors that result from this process are unintentional

## 2023-11-06 NOTE — Addendum Note (Signed)
 Addended by: Elease Etienne A on: 11/06/2023 08:33 AM   Modules accepted: Orders

## 2023-11-06 NOTE — Patient Instructions (Addendum)
 I had a long patient with the patient and his wife regarding his recent intracerebral hemorrhage related to hypertension and being on Eliquis for his A-fib and discuss current equipoise as to whether to resume anticoagulation just continue aspirin and discussed risk-benefit of each.  We also discussed possible participation in the ASPIRE in which patient will be randomized to either aspirin or Eliquis and patient appears interested and we will give him information to review at home and decide.  I recommend he is discontinue Eliquis for now and stay on aspirin alone till he makes a decision about participation in the study.  Continue ongoing physical occupational and speech therapy.  Trial of gabapentin 300 mg at night to help with restless leg syndrome.  Return for follow-up in the future in 6 months or call earlier if necessary.  Restless Legs Syndrome Restless legs syndrome is a condition that causes uncomfortable feelings or sensations in the legs, especially while sitting or lying down. The sensations usually cause an overwhelming urge to move the legs. The arms can also sometimes be affected. The condition can range from mild to severe. The symptoms often interfere with a person's ability to sleep. What are the causes? The cause of this condition is not known. What increases the risk? The following factors may make you more likely to develop this condition: Being older than 50. Pregnancy. Being a woman. In general, the condition is more common in women than in men. A family history of the condition. Having iron deficiency. Overuse of caffeine, nicotine, or alcohol. Certain medical conditions, such as kidney disease, Parkinson's disease, or nerve damage. Certain medicines, such as those for high blood pressure, nausea, colds, allergies, depression, and some heart conditions. What are the signs or symptoms? The main symptom of this condition is uncomfortable sensations in the legs, such  as: Pulling. Tingling. Prickling. Throbbing. Crawling. Burning. Usually, the sensations: Affect both sides of the body. Are worse when you sit or lie down. Are worse at night. These may make it difficult to fall asleep. Make you have a strong urge to move your legs. Are temporarily relieved by moving your legs or standing. The arms can also be affected, but this is rare. People who have this condition often have tiredness during the day because of their lack of sleep at night. How is this diagnosed? This condition may be diagnosed based on: Your symptoms. Blood tests. In some cases, you may be monitored in a sleep lab by a specialist (a sleep study). This can detect any disruptions in your sleep. How is this treated? This condition is treated by managing the symptoms. This may include: Lifestyle changes, such as exercising, using relaxation techniques, and avoiding caffeine, alcohol, or tobacco. Iron supplements. Medicines. Parkinson's medications may be tried first. Anti-seizure medications can also be helpful. Follow these instructions at home: General instructions Take over-the-counter and prescription medicines only as told by your health care provider. Use methods to help relieve the uncomfortable sensations, such as: Massaging your legs. Walking or stretching. Taking a cold or hot bath. Keep all follow-up visits. This is important. Lifestyle     Practice good sleep habits. For example, go to bed and get up at the same time every day. Most adults should get 7-9 hours of sleep each night. Exercise regularly. Try to get at least 30 minutes of exercise most days of the week. Practice ways of relaxing, such as yoga or meditation. Avoid caffeine and alcohol. Do not use any products that contain  nicotine or tobacco. These products include cigarettes, chewing tobacco, and vaping devices, such as e-cigarettes. If you need help quitting, ask your health care provider. Where to  find more information [employer] of Neurological Disorders and Stroke: [employer] Contact a health care provider if: Your symptoms get worse or they do not improve with treatment. Summary Restless legs syndrome is a condition that causes uncomfortable feelings or sensations in the legs, especially while sitting or lying down. The symptoms often interfere with your ability to sleep. This condition is treated by managing the symptoms. You may need to make lifestyle changes or take medicines. This information is not intended to replace advice given to you by your health care provider. Make sure you discuss any questions you have with your health care provider. Document Revised: 03/06/2021 Document Reviewed: 03/06/2021 Elsevier Patient Education  2024 [employer].

## 2023-11-07 ENCOUNTER — Encounter: Payer: Self-pay | Admitting: Speech Pathology

## 2023-11-07 ENCOUNTER — Ambulatory Visit: Attending: Physical Medicine & Rehabilitation | Admitting: Speech Pathology

## 2023-11-07 DIAGNOSIS — R2681 Unsteadiness on feet: Secondary | ICD-10-CM | POA: Diagnosis present

## 2023-11-07 DIAGNOSIS — I610 Nontraumatic intracerebral hemorrhage in hemisphere, subcortical: Secondary | ICD-10-CM | POA: Insufficient documentation

## 2023-11-07 DIAGNOSIS — R278 Other lack of coordination: Secondary | ICD-10-CM | POA: Insufficient documentation

## 2023-11-07 DIAGNOSIS — R262 Difficulty in walking, not elsewhere classified: Secondary | ICD-10-CM | POA: Insufficient documentation

## 2023-11-07 DIAGNOSIS — R41841 Cognitive communication deficit: Secondary | ICD-10-CM | POA: Insufficient documentation

## 2023-11-07 DIAGNOSIS — R471 Dysarthria and anarthria: Secondary | ICD-10-CM | POA: Insufficient documentation

## 2023-11-07 DIAGNOSIS — R41844 Frontal lobe and executive function deficit: Secondary | ICD-10-CM | POA: Diagnosis present

## 2023-11-07 DIAGNOSIS — R2689 Other abnormalities of gait and mobility: Secondary | ICD-10-CM | POA: Diagnosis present

## 2023-11-07 DIAGNOSIS — R4701 Aphasia: Secondary | ICD-10-CM | POA: Diagnosis present

## 2023-11-07 DIAGNOSIS — R41840 Attention and concentration deficit: Secondary | ICD-10-CM | POA: Insufficient documentation

## 2023-11-07 DIAGNOSIS — M6281 Muscle weakness (generalized): Secondary | ICD-10-CM | POA: Insufficient documentation

## 2023-11-07 NOTE — Therapy (Unsigned)
 OUTPATIENT SPEECH LANGUAGE PATHOLOGY EVALUATION   Patient Name: Adrian [name] MRN: [id number] DOB:Aug 16, 1955, 68 y.o., male Today's Date: 11/08/2023  PCP: NA REFERRING PROVIDER: Erick Colace, MD  END OF SESSION:  End of Session - 11/07/23 1234     Visit Number 1    Number of Visits 17    Date for SLP Re-Evaluation 01/07/24    SLP Start Time 1230    SLP Stop Time  1310    SLP Time Calculation (min) 40 min    Activity Tolerance Patient tolerated treatment well             Past Medical History:  Diagnosis Date   AF (paroxysmal atrial fibrillation) (HCC) 11/29/2018   Atrial flutter (HCC) 10/12/2015   a. TEE 3/17 with ? LAA clot-->s/p TEE/DCCV 11/24/2015; s/p DCCV 12/2022;s/p ablation 06/2023   CAD (coronary artery disease), native coronary artery 12/06/2015   a. 10/2015 MV: EF  37%, reversible defect inferior apex, intermediate risk findings; b. 10/2015 Cath: 20% mid RCA.   Carotid artery stenosis    1-39% right ICA stenosis and occluded left ICA   Colonic diverticular abscess    Diverticulitis    History of chemotherapy 2005   Cisplatin   Hypothyroidism    NICM (nonischemic cardiomyopathy) (HCC) 10/14/2015   a. Tachy mediated?;  b. Echo 3/17 - Mild concentric LVH, EF 30-35%, anteroseptal, anterior, anterolateral, apical anterior, lateral hypokinesis, trivial MR, mild to moderately reduced RVSF; c. LHC 3/17 - mRCA 20%   Radiation NOv.3,2005-Dec. 15, 2005   6810 cGy in 30 fractions   Tonsil cancer [facility]) 2005   Dr Otila Kluver.  XRT   Past Surgical History:  Procedure Laterality Date   A-FLUTTER ABLATION N/A 06/19/2023   Procedure: A-FLUTTER ABLATION;  Surgeon: Lanier Prude, MD;  Location: [facility] INVASIVE CV LAB;  Service: Cardiovascular;  Laterality: N/A;   APPENDECTOMY N/A 03/07/2019   Procedure: ROBOTIC ASSISTED APPENDECTOMY;  Surgeon: Karie Soda, MD;  Location: [facility] ORS;  Service: General;  Laterality: N/A;   CARDIAC CATHETERIZATION N/A 10/18/2015   Procedure:  Left Heart Cath and Coronary Angiography;  Surgeon: Kathleene Hazel, MD;  Location: [facility] INVASIVE CV LAB;  Service: Cardiovascular;  Laterality: N/A;   CARDIOVERSION N/A 11/24/2015   Procedure: CARDIOVERSION;  Surgeon: Wendall Stade, MD;  Location: [facility] ENDOSCOPY;  Service: Cardiovascular;  Laterality: N/A;   CARDIOVERSION N/A 12/20/2022   Procedure: CARDIOVERSION;  Surgeon: [name], MD;  Location: [facility] INVASIVE CV LAB;  Service: Cardiovascular;  Laterality: N/A;   CYSTOSCOPY WITH STENT PLACEMENT Bilateral 03/07/2019   Procedure: CYSTOSCOPY WITH BILATERAL FIREFLY INJECTION;  Surgeon: Crist Fat, MD;  Location: [facility] ORS;  Service: Urology;  Laterality: Bilateral;   GASTROSTOMY TUBE PLACEMENT  07/04/2004   IR - G tube for tonsilar cancer   IR ANGIO INTRA EXTRACRAN SEL COM CAROTID INNOMINATE UNI R MOD SED  03/09/2019   IR ANGIO VERTEBRAL SEL VERTEBRAL UNI R MOD SED  03/09/2019   IR CT HEAD LTD  03/09/2019   IR PERCUTANEOUS ART THROMBECTOMY/INFUSION INTRACRANIAL INC DIAG ANGIO  03/09/2019   IR RADIOLOGIST EVAL & MGMT  12/18/2018   LAMINECTOMY     C5/placement of steel plate   LOOP RECORDER INSERTION N/A 06/19/2023   Procedure: LOOP RECORDER INSERTION;  Surgeon: Lanier Prude, MD;  Location: [facility] INVASIVE CV LAB;  Service: Cardiovascular;  Laterality: N/A;   NECK SURGERY  2003   replaced disk   RADIOLOGY WITH ANESTHESIA N/A 03/08/2019   Procedure: IR  WITH ANESTHESIA;  Surgeon: Julieanne Cotton, MD;  Location: [facility] OR;  Service: Radiology;  Laterality: N/A;   TEE WITHOUT CARDIOVERSION N/A 10/15/2015   Procedure: TRANSESOPHAGEAL ECHOCARDIOGRAM (TEE);  Surgeon: Laurey Morale, MD;  Location: [facility] ENDOSCOPY;  Service: Cardiovascular;  Laterality: N/A;   TEE WITHOUT CARDIOVERSION N/A 11/24/2015   Procedure: TRANSESOPHAGEAL ECHOCARDIOGRAM (TEE);  Surgeon: Wendall Stade, MD;  Location: [facility] ENDOSCOPY;  Service: Cardiovascular;  Laterality: N/A;   TEE WITHOUT CARDIOVERSION N/A 12/20/2022   Procedure:  TRANSESOPHAGEAL ECHOCARDIOGRAM;  Surgeon: [name], MD;  Location: [facility] INVASIVE CV LAB;  Service: Cardiovascular;  Laterality: N/A;   XI ROBOTIC ASSISTED COLOSTOMY TAKEDOWN N/A 03/07/2019   Procedure: XI ROBOTIC ASSISTED LOW ANTERIOR RESECTION, RIGID PROCTOSCOPY;  Surgeon: Karie Soda, MD;  Location: [facility] ORS;  Service: General;  Laterality: N/A;   Patient Active Problem List   Diagnosis Date Noted   Carotid occlusion, left 10/23/2023   ICH (intracerebral hemorrhage) (HCC) 08/11/2023   Carotid artery stenosis    Atrial flutter (HCC) 12/19/2022   Essential hypertension 04/22/2021   Hypokalemia 03/12/2019   Expressive aphasia 03/10/2019   Dysphagia 03/10/2019   Middle cerebral artery embolism, left 03/09/2019   Diverticular stricture (HCC) 03/07/2019   Bacteria in urine    Chronic atrial fibrillation (HCC) 12/02/2018   Current use of long term anticoagulation 12/02/2018   UTI (urinary tract infection) 11/29/2018   Hyponatremia 11/29/2018   PAF (paroxysmal atrial fibrillation) (HCC) 11/29/2018   Diverticulitis of large intestine with abscess 09/23/2018   Dyslipidemia 12/05/2017   CAD (coronary artery disease), native coronary artery 12/06/2015   SOB (shortness of breath)    NICM (nonischemic cardiomyopathy) (HCC)    Thyroid activity decreased    Hypothyroidism 08/12/2013   History of radiation therapy 05/30/2012   Smokes tobacco daily 05/30/2012   Cancer of tonsillar fossa (HCC) 11/27/2011   Tonsil cancer (HCC) 2005   History of chemotherapy 2005    ONSET DATE: Referred on 10/26/23 (CVA in Jan)   REFERRING DIAG: I61.0 (ICD-10-CM) - Nontraumatic subcortical hemorrhage of left cerebral hemisphere [facility])   THERAPY DIAG:  Cognitive communication deficit  Dysarthria and anarthria  Rationale for Evaluation and Treatment: Rehabilitation  SUBJECTIVE:   SUBJECTIVE STATEMENT: Pt was pleasant and cooperative throughout evaluation.   Pt accompanied by: self  PERTINENT HISTORY:  Per chart review: 68 y.o. male with history of atrial fibrillation, on Eliquis, carotid artery stenosis, hypothyroidism , CVA 1/25  PAIN:  Are you having pain? No  FALLS: Has patient fallen in last 6 months?  No  LIVING ENVIRONMENT: Lives with: lives alone Lives in: House/apartment Ex Wife will be driving to appointments (or friends) Okey Regal  PLOF:  Level of assistance: Independent with ADLs, Independent with IADLs Employment: Retired; [employer]   PATIENT GOALS: memory, word finding, speech   OBJECTIVE:  Note: Objective measures were completed at Evaluation unless otherwise noted.  DIAGNOSTIC FINDINGS: Per Chart Review  CT HEAD CODE STROKE WO CONTRAST 08/11/23  IMPRESSION: 1.8 x 2.0 x 1.1 cm parenchymal hemorrhage centered in the left basal ganglia with intraventricular extension. No evidence of hydrocephalus at the time of exam. No significant midline shift.   Findings were paged to Dr. Derry Lory on 08/11/23 at 2:29 PM     Electronically Signed   By: Lorenza Cambridge M.D.   On: 08/11/2023 14:31  COGNITION: Overall cognitive status: Impaired Areas of impairment:  Attention: Impaired: Selective, Alternating, Divided Memory: Impaired: Short term Prospective Executive function: Impaired: Problem solving, Organization, Planning, and Slow  processing Functional deficits: Reports difficulty with recall of important information  AUDITORY COMPREHENSION: Overall auditory comprehension: Appears intact YES/NO questions: Appears intact Following directions: Appears intact Conversation: Complex Interfering components: attention and processing speed Effective technique: extra processing time, pausing, and repetition/stressing words  READING COMPREHENSION: Intact  EXPRESSION: verbal  VERBAL EXPRESSION: Level of generative/spontaneous verbalization: conversation  Comments: Anomia  Non-verbal means of communication: N/A  WRITTEN EXPRESSION: Dominant hand: right Written expression:  Not tested  MOTOR SPEECH: Overall motor speech: impaired Level of impairment: Conversation Respiration:  WFL Phonation: normal Resonance: WFL Articulation: Appears intact; pt reports slurred speech when feeling tired Intelligibility: Intelligible; 100% intelligible at eval; reports worsening speech when fatigued Motor planning: Appears intact Motor speech errors:  NA Interfering components:  NA Effective technique: slow rate and over articulate  ORAL MOTOR EXAMINATION: Overall status: Impaired:   Labial: Right (Symmetry) Facial: Right (Symmetry) *ROM is good. At rest, facial droop on R side. Comments: NA   STANDARDIZED ASSESSMENTS: Initiated CLQT - to complete next session    Cognitive Linguistic Quick Test: AGE - 18 - 69   The Cognitive Linguistic Quick Test (CLQT) was administered to assess the relative status of five cognitive domains: attention, memory, language, executive functioning, and visuospatial skills. Scores from 10 tasks were used to estimate severity ratings (standardized for age groups 18-69 years and 70-89 years) for each domain, a clock drawing task, as well as an overall composite severity rating of cognition.       Task Score Criterion Cut Scores  Personal Facts 7/8 8  Symbol Cancellation 11/12 11  Confrontation Naming -/10 10  Clock Drawing  11/13 12  Story Retelling 6/10 6  Symbol Trails 10/10 9  Generative Naming 3/9 5  Design Memory -/6 5  Mazes  8/8 7  Design Generation 5/13 6      PATIENT REPORTED OUTCOME MEASURES (PROM): To complete next session                                                                                                                            TREATMENT DATE:   11/07/23: Provided brief education on cognitive-communication disorder and speech intelligibility.   PATIENT EDUCATION: Education details: SLP role in cognitive-communication impairments Person educated: Patient Education method: Explanation Education  comprehension: needs further education   GOALS: Goals reviewed with patient? No; to update after further evaluation  SHORT TERM GOALS: Target date: 12/08/23  Continue Cognitive testing to determine needs Baseline: Goal status: INITIAL  2.  Complete PROMs measure Baseline:  Goal status: INITIAL  3.   Baseline:  Goal status: INITIAL  4.   Baseline:  Goal status: INITIAL  5.   Baseline:  Goal status: INITIAL  6.   Baseline:  Goal status: INITIAL  LONG TERM GOALS: Target date: 01/08/24  Pt will improve score on PROMs measure Baseline:  Goal status: INITIAL  2.   Baseline:  Goal status: INITIAL  3.   Baseline:  Goal  status: INITIAL  4.   Baseline:  Goal status: INITIAL  5.   Baseline:  Goal status: INITIAL  6.   Baseline:  Goal status: INITIAL  ASSESSMENT:  CLINICAL IMPRESSION: Pt is a 68 yo male who presents to ST OP for evaluation post recent CVA in Jan. Pt lives alone and will be driven to appointments by ex-wife or friends. Pt endorses speech changes, in addition to sx associated with cognitive-communication challenges MV:[id number] of new information, word finding, and slowed processing.  Pt was assessed using CLQT - to complete next session. SLP observed difficulty with short term memory, instances of anomia, and very mild dysarthria. Pt was 100% intelligible in quiet, therapy room to an unfamiliar listener. He reports speech is worse when feeling tired. SLP to provide strategies for pt use in moments where speech may be unclear. Pt reports no difficulty with swallowing at this time and is tolerating a regular/thin diet. SLP rec skilled ST services to address cognitive-communication impairment and dysarthria to maximize functional independence and increase QOL.     OBJECTIVE IMPAIRMENTS: include memory, executive functioning, expressive language, and dysarthria. These impairments are limiting patient from effectively communicating at home and in  community. Factors affecting potential to achieve goals and functional outcome are  NA . Patient will benefit from skilled SLP services to address above impairments and improve overall function.  REHAB POTENTIAL: Good  PLAN:  SLP FREQUENCY: 1-2x/week  SLP DURATION: 8 weeks  PLANNED INTERVENTIONS: Environmental controls, Cueing hierachy, Internal/external aids, Functional tasks, SLP instruction and feedback, Compensatory strategies, Patient/family education, and [id number] Treatment of speech (30 or 45 min)     [employer], CCC-SLP 11/08/2023, 11:03 AM

## 2023-11-08 ENCOUNTER — Ambulatory Visit (INDEPENDENT_AMBULATORY_CARE_PROVIDER_SITE_OTHER): Payer: BC Managed Care – PPO

## 2023-11-08 DIAGNOSIS — I428 Other cardiomyopathies: Secondary | ICD-10-CM

## 2023-11-08 NOTE — Therapy (Signed)
 OUTPATIENT OCCUPATIONAL THERAPY NEURO TREATMENT  Patient Name: Adrian [name] MRN: [id number] DOB:Sep 04, 1955, 68 y.o., male Today's Date: 11/09/2023  PCP: Dr. Caryl Never REFERRING PROVIDER: Dr. Wynn Banker  END OF SESSION:  OT End of Session - 11/09/23 0933     Visit Number 2    Number of Visits 25    Date for OT Re-Evaluation 01/23/24    Authorization Type Medicare    Authorization - Visit Number 2    Progress Note Due on Visit 10    OT Start Time 0933    OT Stop Time 1015    OT Time Calculation (min) 42 min    Activity Tolerance Patient tolerated treatment well    Behavior During Therapy [facility] for tasks assessed/performed              Past Medical History:  Diagnosis Date   AF (paroxysmal atrial fibrillation) (HCC) 11/29/2018   Atrial flutter (HCC) 10/12/2015   a. TEE 3/17 with ? LAA clot-->s/p TEE/DCCV 11/24/2015; s/p DCCV 12/2022;s/p ablation 06/2023   CAD (coronary artery disease), native coronary artery 12/06/2015   a. 10/2015 MV: EF  37%, reversible defect inferior apex, intermediate risk findings; b. 10/2015 Cath: 20% mid RCA.   Carotid artery stenosis    1-39% right ICA stenosis and occluded left ICA   Colonic diverticular abscess    Diverticulitis    History of chemotherapy 2005   Cisplatin   Hypothyroidism    NICM (nonischemic cardiomyopathy) (HCC) 10/14/2015   a. Tachy mediated?;  b. Echo 3/17 - Mild concentric LVH, EF 30-35%, anteroseptal, anterior, anterolateral, apical anterior, lateral hypokinesis, trivial MR, mild to moderately reduced RVSF; c. LHC 3/17 - mRCA 20%   Radiation NOv.3,2005-Dec. 15, 2005   6810 cGy in 30 fractions   Tonsil cancer [facility]) 2005   Dr Otila Kluver.  XRT   Past Surgical History:  Procedure Laterality Date   A-FLUTTER ABLATION N/A 06/19/2023   Procedure: A-FLUTTER ABLATION;  Surgeon: Lanier Prude, MD;  Location: [facility] INVASIVE CV LAB;  Service: Cardiovascular;  Laterality: N/A;   APPENDECTOMY N/A 03/07/2019   Procedure: ROBOTIC  ASSISTED APPENDECTOMY;  Surgeon: Karie Soda, MD;  Location: [facility] ORS;  Service: General;  Laterality: N/A;   CARDIAC CATHETERIZATION N/A 10/18/2015   Procedure: Left Heart Cath and Coronary Angiography;  Surgeon: Kathleene Hazel, MD;  Location: [facility] INVASIVE CV LAB;  Service: Cardiovascular;  Laterality: N/A;   CARDIOVERSION N/A 11/24/2015   Procedure: CARDIOVERSION;  Surgeon: Wendall Stade, MD;  Location: [facility] ENDOSCOPY;  Service: Cardiovascular;  Laterality: N/A;   CARDIOVERSION N/A 12/20/2022   Procedure: CARDIOVERSION;  Surgeon: [name], MD;  Location: [facility] INVASIVE CV LAB;  Service: Cardiovascular;  Laterality: N/A;   CYSTOSCOPY WITH STENT PLACEMENT Bilateral 03/07/2019   Procedure: CYSTOSCOPY WITH BILATERAL FIREFLY INJECTION;  Surgeon: Crist Fat, MD;  Location: [facility] ORS;  Service: Urology;  Laterality: Bilateral;   GASTROSTOMY TUBE PLACEMENT  07/04/2004   IR - G tube for tonsilar cancer   IR ANGIO INTRA EXTRACRAN SEL COM CAROTID INNOMINATE UNI R MOD SED  03/09/2019   IR ANGIO VERTEBRAL SEL VERTEBRAL UNI R MOD SED  03/09/2019   IR CT HEAD LTD  03/09/2019   IR PERCUTANEOUS ART THROMBECTOMY/INFUSION INTRACRANIAL INC DIAG ANGIO  03/09/2019   IR RADIOLOGIST EVAL & MGMT  12/18/2018   LAMINECTOMY     C5/placement of steel plate   LOOP RECORDER INSERTION N/A 06/19/2023   Procedure: LOOP RECORDER INSERTION;  Surgeon: Lanier Prude, MD;  Location: [facility] INVASIVE CV LAB;  Service: Cardiovascular;  Laterality: N/A;   NECK SURGERY  2003   replaced disk   RADIOLOGY WITH ANESTHESIA N/A 03/08/2019   Procedure: IR WITH ANESTHESIA;  Surgeon: Julieanne Cotton, MD;  Location: [facility] OR;  Service: Radiology;  Laterality: N/A;   TEE WITHOUT CARDIOVERSION N/A 10/15/2015   Procedure: TRANSESOPHAGEAL ECHOCARDIOGRAM (TEE);  Surgeon: Laurey Morale, MD;  Location: [facility] ENDOSCOPY;  Service: Cardiovascular;  Laterality: N/A;   TEE WITHOUT CARDIOVERSION N/A 11/24/2015   Procedure: TRANSESOPHAGEAL ECHOCARDIOGRAM  (TEE);  Surgeon: Wendall Stade, MD;  Location: [facility] ENDOSCOPY;  Service: Cardiovascular;  Laterality: N/A;   TEE WITHOUT CARDIOVERSION N/A 12/20/2022   Procedure: TRANSESOPHAGEAL ECHOCARDIOGRAM;  Surgeon: [name], MD;  Location: [facility] INVASIVE CV LAB;  Service: Cardiovascular;  Laterality: N/A;   XI ROBOTIC ASSISTED COLOSTOMY TAKEDOWN N/A 03/07/2019   Procedure: XI ROBOTIC ASSISTED LOW ANTERIOR RESECTION, RIGID PROCTOSCOPY;  Surgeon: Karie Soda, MD;  Location: [facility] ORS;  Service: General;  Laterality: N/A;   Patient Active Problem List   Diagnosis Date Noted   Carotid occlusion, left 10/23/2023   ICH (intracerebral hemorrhage) (HCC) 08/11/2023   Carotid artery stenosis    Atrial flutter (HCC) 12/19/2022   Essential hypertension 04/22/2021   Hypokalemia 03/12/2019   Expressive aphasia 03/10/2019   Dysphagia 03/10/2019   Middle cerebral artery embolism, left 03/09/2019   Diverticular stricture (HCC) 03/07/2019   Bacteria in urine    Chronic atrial fibrillation (HCC) 12/02/2018   Current use of long term anticoagulation 12/02/2018   UTI (urinary tract infection) 11/29/2018   Hyponatremia 11/29/2018   PAF (paroxysmal atrial fibrillation) (HCC) 11/29/2018   Diverticulitis of large intestine with abscess 09/23/2018   Dyslipidemia 12/05/2017   CAD (coronary artery disease), native coronary artery 12/06/2015   SOB (shortness of breath)    NICM (nonischemic cardiomyopathy) (HCC)    Thyroid activity decreased    Hypothyroidism 08/12/2013   History of radiation therapy 05/30/2012   Smokes tobacco daily 05/30/2012   Cancer of tonsillar fossa (HCC) 11/27/2011   Tonsil cancer (HCC) 2005   History of chemotherapy 2005    ONSET DATE: 08/11/23  REFERRING DIAG: I61.0 (ICD-10-CM) - Nontraumatic subcortical hemorrhage of left cerebral hemisphere (HCC)   THERAPY DIAG:  Muscle weakness (generalized)  Other lack of coordination  Attention and concentration deficit  Frontal lobe and  executive function deficit  Unsteadiness on feet  Rationale for Evaluation and Treatment: Rehabilitation  SUBJECTIVE:   SUBJECTIVE STATEMENT: Pt reports that he is doing coordination HEP with difficulty.  Pt reports eating with RUE 90% of the time now.    Pt accompanied by: self  PERTINENT HISTORY:   68 yo male hospitalized 08/11/23 with R sided weakness. CT showed a large basal ganglia ICH with IVH extension. PMH includes:  A-fib, carotid artery stenosis, CAD, tonsil cancer, hypothyroidism, L MCA CVA.    PRECAUTIONS: Fall  WEIGHT BEARING RESTRICTIONS: No  PAIN:  Are you having pain? No  FALLS: Has patient fallen in last 6 months? No  LIVING ENVIRONMENT: Lives with: lives alone has friends and ex wife checks in on him Lives in: House/apartment Stairs: no Has following equipment at home: Single point cane and Tour manager  PLOF: Independent  PATIENT GOALS: improve use of RUE  OBJECTIVE:  Note: Objective measures were completed at Evaluation unless otherwise noted.  HAND DOMINANCE: Right  ADLs: Overall ADLs: mod I with all basic ADLS Transfers/ambulation related to ADLs: Eating: uses RUE 20%, currently using non dominant  LUE,  Grooming: using non dominant LUE UB Dressing: mod I difficulty with buttons LB Dressing: mod I Toileting: mod I with cane Bathing: has tub bench Tub Shower transfers: mod I  Equipment: Transfer tub bench  IADLs: Shopping: needs assist for transportation Light housekeeping: Pt is performing light cleaning, he has occasional assist Meal Prep: microwave meals primarily, does a little stove top Medication management: pt handles  Financial management: pt handles Handwriting: 90% legibility, handwriting is small   MOBILITY STATUS:  mod I with cane  ACTIVITY TOLERANCE: Activity tolerance: standing  UPPER EXTREMITY ROM:  grossly WFLS  UPPER EXTREMITY MMT:     MMT Right eval Left eval  Shoulder flexion 3+/5 4+/5  Shoulder abduction     Shoulder adduction    Shoulder extension    Shoulder internal rotation    Shoulder external rotation    Middle trapezius    Lower trapezius    Elbow flexion 4-/5 4-/5  Elbow extension 4-/5 4-/5  Wrist flexion    Wrist extension    Wrist ulnar deviation    Wrist radial deviation    Wrist pronation    Wrist supination    (Blank rows = not tested)  HAND FUNCTION: Grip strength: Right: 50 lbs; Left: 63 lbs  COORDINATION: 9 Hole Peg test: Right: 59.69 sec; Left: 29.35 sec  SENSATION:diminished in RUE  COGNITION: Overall cognitive status:unable to spell WORLD backwards correctly, recalls 2/3 words following a delay.  VISION ASSESSMENT: Not tested- denies visual changes   OBSERVATIONS: Pleasant gentleman motivated to improve                                                                                                                              TREATMENT DATE:   11/09/23:   Pt instructed in cane HEP.  Pt fatigued quickly and with difficulty with control, but responded well to cueing.  Also reviewed and added to coordination HEP.  Pt able to copy shopping list with good legibility, but several spelling errors--pt reports difficulty with spelling if generating a list at home.   10/31/23- eval     PATIENT EDUCATION: Education details:  Software engineer and reviewed and added to coordination HEP.--see pt instructions  Person educated: Patient Education method: Explanation, Demonstration, Tactile cues, Verbal cues, and Handouts Education comprehension: verbalized understanding, returned demonstration, and verbal cues required  HOME EXERCISE PROGRAM: beginning coordination 3/26 11/09/23  Cane HEP   GOALS: Goals reviewed with patient? Yes  SHORT TERM GOALS: Target date: 12/01/23  I with inital HEP Goal status: INITIAL  2.  Pt will report feeding himself at least 50% of the time with RUE. Baseline: uses 20% x Goal status: INITIAL  3.  Pt will demonstrate improved RUE  fine motor coordination for ADLs as evidenced by decreasing 9 hole peg test score to 52 secs or less Goal status: INITIAL  4.  Pt will report consistently brushing his teeth with RUE Goal status: INITIAL  5.  I with memory compensation strategies Goal status: INITIAL  6.  Pt will report that he has resumed moderate level home management mod  Goal status: INITIAL   LONG TERM GOALS: Target date: 01/23/24  I with updated HEP Goal status: INITIAL  2.  Pt will demonstrate improved RUE fine motor coordination for ADLs as evidenced by decreasing 9 hole peg test score to 48 secs or less Goal status: INITIAL  3.  Pt will demonstrate ability to carry a plate in RUE without spills. Goal status: INITIAL  4.  Pt will write a short paragraph with 100% legibility and minimal decrease in letter size. Goal status: INITIAL  5.  Pt will resume use of RUE as dominant hand at least 75% of the time for ADLs/ IADLs. Goal status: INITIAL  6. Pt will retrieve a 3 lbs object from eye level shelf using RUE with good control  Goal status: inital  ASSESSMENT:  CLINICAL IMPRESSION: Pt is progressing towards goals with reported improved ability to eat with R hand.       PERFORMANCE DEFICITS: in functional skills including ADLs, IADLs, coordination, dexterity, sensation, strength, flexibility, Fine motor control, Gross motor control, mobility, balance, endurance, decreased knowledge of precautions, decreased knowledge of use of DME, and UE functional use, cognitive skills including attention, memory, problem solving, safety awareness, and thought, and psychosocial skills including coping strategies, environmental adaptation, habits, interpersonal interactions, and routines and behaviors.   IMPAIRMENTS: are limiting patient from ADLs, IADLs, play, leisure, and social participation.   CO-MORBIDITIES: may have co-morbidities  that affects occupational performance. Patient will benefit from skilled OT to  address above impairments and improve overall function.  MODIFICATION OR ASSISTANCE TO COMPLETE EVALUATION: No modification of tasks or assist necessary to complete an evaluation.  OT OCCUPATIONAL PROFILE AND HISTORY: Detailed assessment: Review of records and additional review of physical, cognitive, psychosocial history related to current functional performance.  CLINICAL DECISION MAKING: LOW - limited treatment options, no task modification necessary  REHAB POTENTIAL: Good  EVALUATION COMPLEXITY: Low    PLAN:  OT FREQUENCY: 2x/week  OT DURATION: 12 weeks  PLANNED INTERVENTIONS: 97168 OT Re-evaluation, 97535 self care/ADL training, 16109 therapeutic exercise, 97530 therapeutic activity, 97112 neuromuscular re-education, 97140 manual therapy, 97113 aquatic therapy, 97035 ultrasound, 97018 paraffin, 60454 moist heat, 97010 cryotherapy, 97034 contrast bath, 97014 electrical stimulation unattended, 97129 Cognitive training (first 15 min), 09811 Cognitive training(each additional 15 min), 91478 Orthotics management and training, 29562 Splinting (initial encounter), passive range of motion, balance training, functional mobility training, energy conservation, coping strategies training, patient/family education, and DME and/or AE instructions  RECOMMENDED OTHER SERVICES: n/a  CONSULTED AND AGREED WITH PLAN OF CARE: Patient  PLAN FOR NEXT SESSION: review HEPs, continue with RUE coordination and control    Feige Lowdermilk, OTR/L 11/09/2023, 10:14 AM

## 2023-11-09 ENCOUNTER — Encounter: Payer: Self-pay | Admitting: Occupational Therapy

## 2023-11-09 ENCOUNTER — Ambulatory Visit: Admitting: Occupational Therapy

## 2023-11-09 ENCOUNTER — Ambulatory Visit: Admitting: Physical Therapy

## 2023-11-09 ENCOUNTER — Ambulatory Visit: Admitting: Speech Pathology

## 2023-11-09 ENCOUNTER — Encounter: Payer: Self-pay | Admitting: Speech Pathology

## 2023-11-09 DIAGNOSIS — M6281 Muscle weakness (generalized): Secondary | ICD-10-CM

## 2023-11-09 DIAGNOSIS — R278 Other lack of coordination: Secondary | ICD-10-CM

## 2023-11-09 DIAGNOSIS — R2689 Other abnormalities of gait and mobility: Secondary | ICD-10-CM

## 2023-11-09 DIAGNOSIS — R2681 Unsteadiness on feet: Secondary | ICD-10-CM

## 2023-11-09 DIAGNOSIS — R262 Difficulty in walking, not elsewhere classified: Secondary | ICD-10-CM

## 2023-11-09 DIAGNOSIS — R41840 Attention and concentration deficit: Secondary | ICD-10-CM

## 2023-11-09 DIAGNOSIS — R471 Dysarthria and anarthria: Secondary | ICD-10-CM

## 2023-11-09 DIAGNOSIS — R41841 Cognitive communication deficit: Secondary | ICD-10-CM

## 2023-11-09 DIAGNOSIS — R41844 Frontal lobe and executive function deficit: Secondary | ICD-10-CM

## 2023-11-09 NOTE — Therapy (Signed)
 OUTPATIENT PHYSICAL THERAPY TREATMENT   Patient Name: Adrian [name] MRN: [id number] DOB:09/26/1955, 68 y.o., male Today's Date: 11/09/2023  END OF SESSION:  PT End of Session - 11/09/23 1059     Visit Number 3    Number of Visits 17    Date for PT Re-Evaluation 12/26/23    Authorization Type MCR    Authorization Time Period 10/31/23 to 12/26/23    Progress Note Due on Visit 10    PT Start Time 1100    PT Stop Time 1140    PT Time Calculation (min) 40 min    Activity Tolerance Patient tolerated treatment well    Behavior During Therapy [facility] for tasks assessed/performed               Past Medical History:  Diagnosis Date   AF (paroxysmal atrial fibrillation) (HCC) 11/29/2018   Atrial flutter (HCC) 10/12/2015   a. TEE 3/17 with ? LAA clot-->s/p TEE/DCCV 11/24/2015; s/p DCCV 12/2022;s/p ablation 06/2023   CAD (coronary artery disease), native coronary artery 12/06/2015   a. 10/2015 MV: EF  37%, reversible defect inferior apex, intermediate risk findings; b. 10/2015 Cath: 20% mid RCA.   Carotid artery stenosis    1-39% right ICA stenosis and occluded left ICA   Colonic diverticular abscess    Diverticulitis    History of chemotherapy 2005   Cisplatin   Hypothyroidism    NICM (nonischemic cardiomyopathy) (HCC) 10/14/2015   a. Tachy mediated?;  b. Echo 3/17 - Mild concentric LVH, EF 30-35%, anteroseptal, anterior, anterolateral, apical anterior, lateral hypokinesis, trivial MR, mild to moderately reduced RVSF; c. LHC 3/17 - mRCA 20%   Radiation NOv.3,2005-Dec. 15, 2005   6810 cGy in 30 fractions   Tonsil cancer [facility]) 2005   Dr Otila Kluver.  XRT   Past Surgical History:  Procedure Laterality Date   A-FLUTTER ABLATION N/A 06/19/2023   Procedure: A-FLUTTER ABLATION;  Surgeon: Lanier Prude, MD;  Location: [facility] INVASIVE CV LAB;  Service: Cardiovascular;  Laterality: N/A;   APPENDECTOMY N/A 03/07/2019   Procedure: ROBOTIC ASSISTED APPENDECTOMY;  Surgeon: Karie Soda, MD;   Location: [facility] ORS;  Service: General;  Laterality: N/A;   CARDIAC CATHETERIZATION N/A 10/18/2015   Procedure: Left Heart Cath and Coronary Angiography;  Surgeon: Kathleene Hazel, MD;  Location: [facility] INVASIVE CV LAB;  Service: Cardiovascular;  Laterality: N/A;   CARDIOVERSION N/A 11/24/2015   Procedure: CARDIOVERSION;  Surgeon: Wendall Stade, MD;  Location: [facility] ENDOSCOPY;  Service: Cardiovascular;  Laterality: N/A;   CARDIOVERSION N/A 12/20/2022   Procedure: CARDIOVERSION;  Surgeon: [name], MD;  Location: [facility] INVASIVE CV LAB;  Service: Cardiovascular;  Laterality: N/A;   CYSTOSCOPY WITH STENT PLACEMENT Bilateral 03/07/2019   Procedure: CYSTOSCOPY WITH BILATERAL FIREFLY INJECTION;  Surgeon: Crist Fat, MD;  Location: [facility] ORS;  Service: Urology;  Laterality: Bilateral;   GASTROSTOMY TUBE PLACEMENT  07/04/2004   IR - G tube for tonsilar cancer   IR ANGIO INTRA EXTRACRAN SEL COM CAROTID INNOMINATE UNI R MOD SED  03/09/2019   IR ANGIO VERTEBRAL SEL VERTEBRAL UNI R MOD SED  03/09/2019   IR CT HEAD LTD  03/09/2019   IR PERCUTANEOUS ART THROMBECTOMY/INFUSION INTRACRANIAL INC DIAG ANGIO  03/09/2019   IR RADIOLOGIST EVAL & MGMT  12/18/2018   LAMINECTOMY     C5/placement of steel plate   LOOP RECORDER INSERTION N/A 06/19/2023   Procedure: LOOP RECORDER INSERTION;  Surgeon: Lanier Prude, MD;  Location: [facility] INVASIVE CV LAB;  Service: Cardiovascular;  Laterality: N/A;   NECK SURGERY  2003   replaced disk   RADIOLOGY WITH ANESTHESIA N/A 03/08/2019   Procedure: IR WITH ANESTHESIA;  Surgeon: Julieanne Cotton, MD;  Location: [facility] OR;  Service: Radiology;  Laterality: N/A;   TEE WITHOUT CARDIOVERSION N/A 10/15/2015   Procedure: TRANSESOPHAGEAL ECHOCARDIOGRAM (TEE);  Surgeon: Laurey Morale, MD;  Location: [facility] ENDOSCOPY;  Service: Cardiovascular;  Laterality: N/A;   TEE WITHOUT CARDIOVERSION N/A 11/24/2015   Procedure: TRANSESOPHAGEAL ECHOCARDIOGRAM (TEE);  Surgeon: Wendall Stade, MD;  Location: [facility]  ENDOSCOPY;  Service: Cardiovascular;  Laterality: N/A;   TEE WITHOUT CARDIOVERSION N/A 12/20/2022   Procedure: TRANSESOPHAGEAL ECHOCARDIOGRAM;  Surgeon: [name], MD;  Location: [facility] INVASIVE CV LAB;  Service: Cardiovascular;  Laterality: N/A;   XI ROBOTIC ASSISTED COLOSTOMY TAKEDOWN N/A 03/07/2019   Procedure: XI ROBOTIC ASSISTED LOW ANTERIOR RESECTION, RIGID PROCTOSCOPY;  Surgeon: Karie Soda, MD;  Location: [facility] ORS;  Service: General;  Laterality: N/A;   Patient Active Problem List   Diagnosis Date Noted   Carotid occlusion, left 10/23/2023   ICH (intracerebral hemorrhage) (HCC) 08/11/2023   Carotid artery stenosis    Atrial flutter (HCC) 12/19/2022   Essential hypertension 04/22/2021   Hypokalemia 03/12/2019   Expressive aphasia 03/10/2019   Dysphagia 03/10/2019   Middle cerebral artery embolism, left 03/09/2019   Diverticular stricture (HCC) 03/07/2019   Bacteria in urine    Chronic atrial fibrillation (HCC) 12/02/2018   Current use of long term anticoagulation 12/02/2018   UTI (urinary tract infection) 11/29/2018   Hyponatremia 11/29/2018   PAF (paroxysmal atrial fibrillation) (HCC) 11/29/2018   Diverticulitis of large intestine with abscess 09/23/2018   Dyslipidemia 12/05/2017   CAD (coronary artery disease), native coronary artery 12/06/2015   SOB (shortness of breath)    NICM (nonischemic cardiomyopathy) (HCC)    Thyroid activity decreased    Hypothyroidism 08/12/2013   History of radiation therapy 05/30/2012   Smokes tobacco daily 05/30/2012   Cancer of tonsillar fossa (HCC) 11/27/2011   Tonsil cancer (HCC) 2005   History of chemotherapy 2005    PCP: Evelena Peat MD   REFERRING PROVIDER: Erick Colace, MD  REFERRING DIAG: I61.0 (ICD-10-CM) - Nontraumatic subcortical hemorrhage of left cerebral hemisphere [facility])  THERAPY DIAG:  No diagnosis found.  Rationale for Evaluation and Treatment: Rehabilitation  ONSET DATE: 08/11/23  SUBJECTIVE:    SUBJECTIVE STATEMENT: Pt reports nothing new or different. Steps are still difficult and side to side stepping.   PERTINENT HISTORY: 1.  History of left basal ganglia hemorrhage approximately 2 and half months ago.  He has completed inpatient rehabilitation as well as home health.  I do think he would be a great candidate for outpatient therapy.  He has gotten back to modified independent level but still has some mild weakness on the right side as well as fine motor issues and motor control issues.  His balance is also off.  He continues wear right AFO because of foot and ankle weakness.  Patient has some short-term memory issues as well Will make referral for outpatient PT OT and speech therapy Physical medicine rehab follow-up in 3 months  PAIN:  Are you having pain? No  PRECAUTIONS: Fall and Other: R hemi, has loop recorder   RED FLAGS: None   WEIGHT BEARING RESTRICTIONS: No  FALLS:  Has patient fallen in last 6 months? No  LIVING ENVIRONMENT: Lives with: lives alone Lives in: House/apartment Stairs: 2 STE with rail  Has following equipment at  home: Single point cane and Walker - 2 wheeled  OCCUPATION: retired- used to work for [employer]   PLOF: Independent, Independent with basic ADLs, Independent with gait, Independent with transfers, and Requires assistive device for independence  PATIENT GOALS: walk straight without a cane, get back to driving and motorcycling   NEXT MD VISIT: Referring 01/25/24  OBJECTIVE:  Note: Objective measures were completed at Evaluation unless otherwise noted.  DIAGNOSTIC FINDINGS:   CLINICAL DATA:  Stroke, follow up Neuro deficit, acute, stroke suspected   EXAM: MRI HEAD WITHOUT AND WITH CONTRAST   TECHNIQUE: Multiplanar, multiecho pulse sequences of the brain and surrounding structures were obtained without and with intravenous contrast.   CONTRAST:  6.34mL GADAVIST GADOBUTROL 1 MMOL/ML IV SOLN   COMPARISON:  CT head from today.    FINDINGS: Brain: When comparing across modalities, no substantial change in intraparenchymal hemorrhage in the left basal ganglia with intraventricular extension. No visible surrounding acute hemorrhage or mass lesion; however, acute blood products limits assessment. Prior left frontal infarct with encephalomalacia. No hydrocephalus. No pathologic enhancement.   Vascular: Major arterial flow voids are maintained at the skull base.   Skull and upper cervical spine: Normal marrow signal.   Sinuses/Orbits: Right frontal sinus opacification. Remaining sinuses are clear. No acute orbital findings.   Other: No mastoid effusions.   IMPRESSION: 1. When comparing across modalities, no substantial change in intraparenchymal hemorrhage in the left basal ganglia with intraventricular extension. No progressive mass effect. 2. No visible surrounding acute hemorrhage or mass lesion; however, acute blood products limits assessment  EXAM: CT HEAD WITHOUT CONTRAST   TECHNIQUE: Contiguous axial images were obtained from the base of the skull through the vertex without intravenous contrast.   RADIATION DOSE REDUCTION: This exam was performed according to the departmental dose-optimization program which includes automated exposure control, adjustment of the mA and/or kV according to patient size and/or use of iterative reconstruction technique.   COMPARISON:  Head CT 08/12/2023   FINDINGS: Brain: Interval decrease in size of the previously seen hemorrhage in the left basal ganglia, now measuring 10 x 7 mm, previously 20 x 12 mm. There is resolution of intraventricular blood products. No hydrocephalus. No extra-axial fluid collection. No mass effect. No mass lesion. No CT evidence of an acute cortical infarct. There is chronic left MCA territory infarct involving the left opercular region.   Vascular: No hyperdense vessel or unexpected calcification.   Skull: Normal. Negative for  fracture or focal lesion.   Sinuses/Orbits: No middle ear or mastoid effusion. Paranasal sinuses are notable for complete opacification of the right frontal sinus. Orbits are unremarkable.   Other: None.   IMPRESSION: No acute intracranial abnormality. Interval decrease in size of left basal ganglia hemorrhage, now measuring 10 x 7 mm, previously 20 x 12 mm. Resolution of intraventricular blood products. No hydrocephalus.    PATIENT SURVEYS:    Patient-Specific Activity Scoring Scheme  "0" represents "unable to perform." "10" represents "able to perform at prior level. 0 1 2 3 4 5 6 7 8 9  10 (Date and Score)   Activity Eval     1. Walking straight without the cane  4     2. Strength in RLE   4    3. Coordinated movements of R LE  3   4.    5.    Score 3.7    Total score = sum of the activity scores/number of activities Minimum detectable change (90%CI) for average score = 2 points  Minimum detectable change (90%CI) for single activity score = 3 points     COGNITION: Overall cognitive status: Within functional limits for tasks assessed     SENSATION: Not tested      LOWER EXTREMITY MMT:  MMT Right eval Left eval  Hip flexion 4 5  Hip extension    Hip abduction    Hip adduction    Hip internal rotation    Hip external rotation    Knee flexion 4 5  Knee extension 4 5  Ankle dorsiflexion 1 at best (in AFO, reports no movement in ankle)   Ankle plantarflexion    Ankle inversion    Ankle eversion     (Blank rows = not tested)    FUNCTIONAL TESTS:  5 times sit to stand: 12.8 seconds some off shift from R LE  Timed up and go (TUG): deferred  3 minute walk test: 266ft [facility]     10/31/23 0001  Standardized Balance Assessment  Standardized Balance Assessment Dynamic Gait Index  Dynamic Gait Index  Level Surface 2  Change in Gait Speed 1  Gait with Horizontal Head Turns 2  Gait with Vertical Head Turns 2  Gait and Pivot Turn 1  Step Over Obstacle  1  Step Around Obstacles 1  Steps 0  Total Score 10    GAIT: Distance walked: 261ft  Assistive device utilized: Single point cane Level of assistance: Modified independence Comments: wide BOS, increased tone in R LE with gait pattern- limited knee ROM and ankle tends to invert a bit , mild unsteadiness with corners, limited rotation at trunk/hips                                                                                                                                 TREATMENT DATE:  11/09/23 Standing Heel/toe raise 2x10 Gastroc stretch x 30" Soleus stretch x 30" Staggered stance fwd/bwd weight shifts 2x10 R&L Staggered stance heel/toe x10 3 way clock reach, wash rag under foot 2x10 R&L Amb SPC with R AFO, working on heel/toe pattern and symmetrical step lengths multiple 2x3 laps around gym, clockwise and counter clockwise  11/05/23 NuStep L5x31mins, did just LE after 3 mins  Step ups 4" Leg ext 3# 2x10 HS curls red 2x10 Rocker board  Side steps on beam STS x10 then with OHP   10/31/23  Eval, POC, HEP- lots of education on stroke recovery and 6 month window for optimal regains of strength/balance/function, encouraged him to keep doing HEP from HHPT as well as balance work he found for himself, has been able to get on/off and use his TM safely at home so encouraged this as well  Nustep L5 x5 minutes BLEs only          PATIENT EDUCATION:  Education details: exam findings, POC, HEP, otherwise as above  Person educated: Patient Education method: Explanation, Demonstration, and Handouts Education comprehension: verbalized understanding, returned demonstration, and needs further  education  HOME EXERCISE PROGRAM: Access Code: [email or web address] URL: [email or web address] Date: 11/09/2023 Prepared by: Vernon Prey April Kirstie Peri  Exercises - Heel Toe Raises with Counter Support  - 1 x daily - 7 x weekly - 2 sets - 10 reps - Stride Stance Weight Shift  - 1 x  daily - 7 x weekly - 2 sets - 10 reps - Single Leg Balance with Clock Reach (with wash rag)  - 1 x daily - 7 x weekly - 2 sets - 10 reps  ASSESSMENT:  CLINICAL IMPRESSION: Treatment focused on improving R LE weight bearing, strength and coordination. Worked on improving pt's gait pattern for improved normal reciprocal pattern with SPC  From eval: Patient is a 68 y.o. M who was seen today for physical therapy evaluation and treatment for I61.0 (ICD-10-CM) - Nontraumatic subcortical hemorrhage of left cerebral hemisphere [facility]). He is very motivated and moving a very high level given the hemorrhagic CVA. His main concerns are balance/coordination and getting back to walking without assistive device. Anticipate he will do very well with skilled PT services.   OBJECTIVE IMPAIRMENTS: Abnormal gait, decreased activity tolerance, decreased balance, decreased coordination, decreased knowledge of use of DME, decreased mobility, difficulty walking, decreased strength, and decreased safety awareness.   ACTIVITY LIMITATIONS: standing, squatting, stairs, transfers, and locomotion level  PARTICIPATION LIMITATIONS: driving, shopping, community activity, and yard work  PERSONAL FACTORS: Age, Behavior pattern, Fitness, Past/current experiences, Social background, Time since onset of injury/illness/exacerbation, and Transportation are also affecting patient's functional outcome.   REHAB POTENTIAL: Good  CLINICAL DECISION MAKING: Evolving/moderate complexity  EVALUATION COMPLEXITY: Moderate   GOALS: Goals reviewed with patient? No  SHORT TERM GOALS: Target date: 11/28/2023   Will be compliant with appropriate progressive HEP  Baseline: Goal status: INITIAL  2.  Will score 14/24 on DGI  Baseline: 10 Goal status: INITIAL  3.  Will be able to perform all functional transfers without compensation strategies or offshift from hemi LE  Baseline:  Goal status: INITIAL  4.  Will be able to ambulate  household distances without AD, Mod(I) Baseline:  Goal status: INITIAL    LONG TERM GOALS: Target date: 12/26/2023    MMT in R LE to be at least 4+/5 in hip and knee musculature  Baseline:  Goal status: INITIAL  2.  Will score 18/24 on DGI to show improved functional balance  Baseline:  Goal status: INITIAL  3.  Will be able to walk at least 231ft over uneven/unsteady surfaces without device and no more than S/ 589ft over even surfaces no device Mod(I) Baseline:  Goal status: INITIAL  4.  Will be able to ascend and descend steps without rail and minimal compensation strategies  Baseline:  Goal status: INITIAL  5.  Will be compliant with advanced HEP vs gym based program at DC to maintain functional gains  Baseline:  Goal status: INITIAL  6.  PSFS to improve by at least 3 points  Baseline: 3.7 Goal status: INITIAL   PLAN:  PT FREQUENCY: 2x/week  PT DURATION: 8 weeks  PLANNED INTERVENTIONS: [id number]- PT Re-evaluation, 97110-Therapeutic exercises, 97530- Therapeutic activity, 97112- Neuromuscular re-education, 97535- Self Care, [id number]- Manual therapy, [id number]- Gait training, [phone]- Orthotic Fit/training, [phone]- Aquatic Therapy, Stair training, Taping, Dry Needling, Cryotherapy, and Moist heat  PLAN FOR NEXT SESSION: strength, balance, gait training, functional conditioning.   Agam Tuohy April Dell Ponto, PT, DPT 11/09/23 11:00 AM

## 2023-11-09 NOTE — Therapy (Signed)
 OUTPATIENT SPEECH LANGUAGE PATHOLOGY EVALUATION   Patient Name: Adrian [name] MRN: [id number] DOB:09-10-1955, 68 y.o., male Today's Date: 11/09/2023  PCP: NA REFERRING PROVIDER: Erick Colace, MD  END OF SESSION:  End of Session - 11/09/23 1021     Visit Number 2    Number of Visits 17    Date for SLP Re-Evaluation 01/07/24    SLP Start Time 1015    SLP Stop Time  1055    SLP Time Calculation (min) 40 min    Activity Tolerance Patient tolerated treatment well             Past Medical History:  Diagnosis Date   AF (paroxysmal atrial fibrillation) (HCC) 11/29/2018   Atrial flutter (HCC) 10/12/2015   a. TEE 3/17 with ? LAA clot-->s/p TEE/DCCV 11/24/2015; s/p DCCV 12/2022;s/p ablation 06/2023   CAD (coronary artery disease), native coronary artery 12/06/2015   a. 10/2015 MV: EF  37%, reversible defect inferior apex, intermediate risk findings; b. 10/2015 Cath: 20% mid RCA.   Carotid artery stenosis    1-39% right ICA stenosis and occluded left ICA   Colonic diverticular abscess    Diverticulitis    History of chemotherapy 2005   Cisplatin   Hypothyroidism    NICM (nonischemic cardiomyopathy) (HCC) 10/14/2015   a. Tachy mediated?;  b. Echo 3/17 - Mild concentric LVH, EF 30-35%, anteroseptal, anterior, anterolateral, apical anterior, lateral hypokinesis, trivial MR, mild to moderately reduced RVSF; c. LHC 3/17 - mRCA 20%   Radiation NOv.3,2005-Dec. 15, 2005   6810 cGy in 30 fractions   Tonsil cancer [facility]) 2005   Dr Otila Kluver.  XRT   Past Surgical History:  Procedure Laterality Date   A-FLUTTER ABLATION N/A 06/19/2023   Procedure: A-FLUTTER ABLATION;  Surgeon: Lanier Prude, MD;  Location: [facility] INVASIVE CV LAB;  Service: Cardiovascular;  Laterality: N/A;   APPENDECTOMY N/A 03/07/2019   Procedure: ROBOTIC ASSISTED APPENDECTOMY;  Surgeon: Karie Soda, MD;  Location: [facility] ORS;  Service: General;  Laterality: N/A;   CARDIAC CATHETERIZATION N/A 10/18/2015   Procedure:  Left Heart Cath and Coronary Angiography;  Surgeon: Kathleene Hazel, MD;  Location: [facility] INVASIVE CV LAB;  Service: Cardiovascular;  Laterality: N/A;   CARDIOVERSION N/A 11/24/2015   Procedure: CARDIOVERSION;  Surgeon: Wendall Stade, MD;  Location: [facility] ENDOSCOPY;  Service: Cardiovascular;  Laterality: N/A;   CARDIOVERSION N/A 12/20/2022   Procedure: CARDIOVERSION;  Surgeon: [name], MD;  Location: [facility] INVASIVE CV LAB;  Service: Cardiovascular;  Laterality: N/A;   CYSTOSCOPY WITH STENT PLACEMENT Bilateral 03/07/2019   Procedure: CYSTOSCOPY WITH BILATERAL FIREFLY INJECTION;  Surgeon: Crist Fat, MD;  Location: [facility] ORS;  Service: Urology;  Laterality: Bilateral;   GASTROSTOMY TUBE PLACEMENT  07/04/2004   IR - G tube for tonsilar cancer   IR ANGIO INTRA EXTRACRAN SEL COM CAROTID INNOMINATE UNI R MOD SED  03/09/2019   IR ANGIO VERTEBRAL SEL VERTEBRAL UNI R MOD SED  03/09/2019   IR CT HEAD LTD  03/09/2019   IR PERCUTANEOUS ART THROMBECTOMY/INFUSION INTRACRANIAL INC DIAG ANGIO  03/09/2019   IR RADIOLOGIST EVAL & MGMT  12/18/2018   LAMINECTOMY     C5/placement of steel plate   LOOP RECORDER INSERTION N/A 06/19/2023   Procedure: LOOP RECORDER INSERTION;  Surgeon: Lanier Prude, MD;  Location: [facility] INVASIVE CV LAB;  Service: Cardiovascular;  Laterality: N/A;   NECK SURGERY  2003   replaced disk   RADIOLOGY WITH ANESTHESIA N/A 03/08/2019   Procedure: IR  WITH ANESTHESIA;  Surgeon: Julieanne Cotton, MD;  Location: [facility] OR;  Service: Radiology;  Laterality: N/A;   TEE WITHOUT CARDIOVERSION N/A 10/15/2015   Procedure: TRANSESOPHAGEAL ECHOCARDIOGRAM (TEE);  Surgeon: Laurey Morale, MD;  Location: [facility] ENDOSCOPY;  Service: Cardiovascular;  Laterality: N/A;   TEE WITHOUT CARDIOVERSION N/A 11/24/2015   Procedure: TRANSESOPHAGEAL ECHOCARDIOGRAM (TEE);  Surgeon: Wendall Stade, MD;  Location: [facility] ENDOSCOPY;  Service: Cardiovascular;  Laterality: N/A;   TEE WITHOUT CARDIOVERSION N/A 12/20/2022   Procedure:  TRANSESOPHAGEAL ECHOCARDIOGRAM;  Surgeon: [name], MD;  Location: [facility] INVASIVE CV LAB;  Service: Cardiovascular;  Laterality: N/A;   XI ROBOTIC ASSISTED COLOSTOMY TAKEDOWN N/A 03/07/2019   Procedure: XI ROBOTIC ASSISTED LOW ANTERIOR RESECTION, RIGID PROCTOSCOPY;  Surgeon: Karie Soda, MD;  Location: [facility] ORS;  Service: General;  Laterality: N/A;   Patient Active Problem List   Diagnosis Date Noted   Carotid occlusion, left 10/23/2023   ICH (intracerebral hemorrhage) (HCC) 08/11/2023   Carotid artery stenosis    Atrial flutter (HCC) 12/19/2022   Essential hypertension 04/22/2021   Hypokalemia 03/12/2019   Expressive aphasia 03/10/2019   Dysphagia 03/10/2019   Middle cerebral artery embolism, left 03/09/2019   Diverticular stricture (HCC) 03/07/2019   Bacteria in urine    Chronic atrial fibrillation (HCC) 12/02/2018   Current use of long term anticoagulation 12/02/2018   UTI (urinary tract infection) 11/29/2018   Hyponatremia 11/29/2018   PAF (paroxysmal atrial fibrillation) (HCC) 11/29/2018   Diverticulitis of large intestine with abscess 09/23/2018   Dyslipidemia 12/05/2017   CAD (coronary artery disease), native coronary artery 12/06/2015   SOB (shortness of breath)    NICM (nonischemic cardiomyopathy) (HCC)    Thyroid activity decreased    Hypothyroidism 08/12/2013   History of radiation therapy 05/30/2012   Smokes tobacco daily 05/30/2012   Cancer of tonsillar fossa (HCC) 11/27/2011   Tonsil cancer (HCC) 2005   History of chemotherapy 2005    ONSET DATE: Referred on 10/26/23 (CVA in Jan)   REFERRING DIAG: I61.0 (ICD-10-CM) - Nontraumatic subcortical hemorrhage of left cerebral hemisphere (HCC)   THERAPY DIAG:  Cognitive communication deficit  Dysarthria and anarthria  Rationale for Evaluation and Treatment: Rehabilitation  SUBJECTIVE:   SUBJECTIVE STATEMENT: Pt had   Pt accompanied by: self  PERTINENT HISTORY: Per chart review: 68 y.o. male with history of  atrial fibrillation, on Eliquis, carotid artery stenosis, hypothyroidism , CVA 1/25  PAIN:  Are you having pain? No  FALLS: Has patient fallen in last 6 months?  No  LIVING ENVIRONMENT: Lives with: lives alone Lives in: House/apartment Ex Wife will be driving to appointments (or friends) Okey Regal  PLOF:  Level of assistance: Independent with ADLs, Independent with IADLs Employment: Retired; [employer]   PATIENT GOALS: memory, word finding, speech   OBJECTIVE:  Note: Objective measures were completed at Evaluation unless otherwise noted.  DIAGNOSTIC FINDINGS: Per Chart Review  CT HEAD CODE STROKE WO CONTRAST 08/11/23  IMPRESSION: 1.8 x 2.0 x 1.1 cm parenchymal hemorrhage centered in the left basal ganglia with intraventricular extension. No evidence of hydrocephalus at the time of exam. No significant midline shift.   Findings were paged to Dr. Derry Lory on 08/11/23 at 2:29 PM     Electronically Signed   By: Lorenza Cambridge M.D.   On: 08/11/2023 14:31  COGNITION: Overall cognitive status: Impaired Areas of impairment:  Attention: Impaired: Selective, Alternating, Divided Memory: Impaired: Short term Prospective Executive function: Impaired: Problem solving, Organization, Planning, and Slow processing Functional deficits: Reports difficulty  with recall of important information  AUDITORY COMPREHENSION: Overall auditory comprehension: Appears intact YES/NO questions: Appears intact Following directions: Appears intact Conversation: Complex Interfering components: attention and processing speed Effective technique: extra processing time, pausing, and repetition/stressing words  READING COMPREHENSION: Intact  EXPRESSION: verbal  VERBAL EXPRESSION: Level of generative/spontaneous verbalization: conversation  Comments: Anomia  Non-verbal means of communication: N/A  WRITTEN EXPRESSION: Dominant hand: right Written expression: Not tested  MOTOR SPEECH: Overall motor  speech: impaired Level of impairment: Conversation Respiration:  WFL Phonation: normal Resonance: WFL Articulation: Appears intact; pt reports slurred speech when feeling tired Intelligibility: Intelligible; 100% intelligible at eval; reports worsening speech when fatigued Motor planning: Appears intact Motor speech errors:  NA Interfering components:  NA Effective technique: slow rate and over articulate  ORAL MOTOR EXAMINATION: Overall status: Impaired:   Labial: Right (Symmetry) Facial: Right (Symmetry) *ROM is good. At rest, facial droop on R side. Comments: NA   STANDARDIZED ASSESSMENTS: Initiated CLQT - to complete next session    Cognitive Linguistic Quick Test: AGE - 18 - 69   The Cognitive Linguistic Quick Test (CLQT) was administered to assess the relative status of five cognitive domains: attention, memory, language, executive functioning, and visuospatial skills. Scores from 10 tasks were used to estimate severity ratings (standardized for age groups 18-69 years and 70-89 years) for each domain, a clock drawing task, as well as an overall composite severity rating of cognition.       Task Score Criterion Cut Scores  Personal Facts 7/8 8  Symbol Cancellation 11/12 11  Confrontation Naming 10/10 10  Clock Drawing  11/13 12  Story Retelling 6/10 6  Symbol Trails 10/10 9  Generative Naming 3/9 5  Design Memory 6/6 5  Mazes  8/8 7  Design Generation 5/13 6     Cognitive Domain Composite Score Severity Rating  Attention 190/215 WNL  Memory 148/185 Mild  Executive Function 26/40 WNL  Language 26/37 Mild  Visuospatial Skills 95/105 WNL  Clock Drawing  11/13 Mild  Composite Severity Rating  WNL           PATIENT REPORTED OUTCOME MEASURES (PROM): To complete next session                                                                                                                            TREATMENT DATE:   11/09/23: Pt was seen for skilled ST  services targeting completed cognitive-communication testing and education. SLP reviewed CLQT results with patient. He had no further questions. SLP provided pt with cognitive-communication handout. Pt reported difficulty with re: recalling details in conversations, understanding TV shows, difficulty with expressing thoughts, and reading/writing.  Pt has residual aphasia from previous stroke. To provide strategies in this POC.   11/07/23: Provided brief education on cognitive-communication disorder and speech intelligibility.   PATIENT EDUCATION: Education details: SLP role in cognitive-communication impairments Person educated: Patient Education method: Explanation Education comprehension: needs further education   GOALS: Goals reviewed  with patient? No; to update after further evaluation  SHORT TERM GOALS: Target date: 12/08/23  Continue Cognitive testing to determine needs Baseline: Goal status: MET  2.  Complete PROMs measure Baseline:  Goal status: INITIAL  3.   Baseline:  Goal status: INITIAL  4.   Baseline:  Goal status: INITIAL  5.   Baseline:  Goal status: INITIAL  6.   Baseline:  Goal status: INITIAL  LONG TERM GOALS: Target date: 01/08/24  Pt will improve score on PROMs measure Baseline:  Goal status: INITIAL  2.   Baseline:  Goal status: INITIAL  3.   Baseline:  Goal status: INITIAL  4.   Baseline:  Goal status: INITIAL  5.   Baseline:  Goal status: INITIAL  6.   Baseline:  Goal status: INITIAL  ASSESSMENT:  CLINICAL IMPRESSION: Pt is a 68 yo male who presents to ST OP for evaluation post recent CVA in Jan. Pt lives alone and will be driven to appointments by ex-wife or friends. Pt endorses speech changes, in addition to sx associated with cognitive-communication challenges [id number] of new information, word finding, and slowed processing.  Pt was assessed using CLQT - to complete next session. SLP observed difficulty with short term memory,  instances of anomia, and very mild dysarthria. Pt was 100% intelligible in quiet, therapy room to an unfamiliar listener. He reports speech is worse when feeling tired. SLP to provide strategies for pt use in moments where speech may be unclear. Pt reports no difficulty with swallowing at this time and is tolerating a regular/thin diet. SLP rec skilled ST services to address cognitive-communication impairment and dysarthria to maximize functional independence and increase QOL.     OBJECTIVE IMPAIRMENTS: include memory, executive functioning, expressive language, and dysarthria. These impairments are limiting patient from effectively communicating at home and in community. Factors affecting potential to achieve goals and functional outcome are  NA . Patient will benefit from skilled SLP services to address above impairments and improve overall function.  REHAB POTENTIAL: Good  PLAN:  SLP FREQUENCY: 1-2x/week  SLP DURATION: 8 weeks  PLANNED INTERVENTIONS: Environmental controls, Cueing hierachy, Internal/external aids, Functional tasks, SLP instruction and feedback, Compensatory strategies, Patient/family education, and [id number] Treatment of speech (30 or 45 min)     [employer], CCC-SLP 11/09/2023, 10:22 AM

## 2023-11-09 NOTE — Patient Instructions (Addendum)
 SELF ASSISTED WITH OBJECT: Shoulder Flexion - Sitting    Sit, With arms straight, hold cane forward at waist. Raise cane above head. Hold 3 seconds.  Elbows straight.  HOLD CANE HANDLE WITH RIGHT HAND Repeat 10 times. Do 2 times per day.   Chest Press    In shoulder width stance hold cane in both hands at chest with shoulder blades back, then press arms straight ahead. Repeat 10 times per set. Do 2x per day   ROM: Abduction - Wand   Holding wand with RIGHT hand palm up/on handle, push wand directly out to side, leading with other hand palm down, until stretch is felt. Hold 5 seconds. Repeat 10 times per set. Do 2 sessions per day.    ROM: Extension - Wand (Standing)   Stand holding wand behind back. Raise arms as far as possible.  PALMS FORWARD/RIGHT HAND ON HANDLE Repeat 10 times per set.  Do 2 sessions per day.    Coordination Exercises  Perform the following activities for 10 minutes 1 times per day with right hand(s).   Flip cards 1 at a time  Deal cards with your thumb (Hold deck in hand and push card off top with thumb). Pick up coins and place in container or coin bank. Pick up coins and stack. Pick up coins one at a time until you get 5-10 in your hand, then move coins from palm to fingertips to  place in a container Rotate ball in fingertips (clockwise and counter-clockwise). Practice writing

## 2023-11-12 ENCOUNTER — Encounter: Payer: Self-pay | Admitting: Occupational Therapy

## 2023-11-12 ENCOUNTER — Ambulatory Visit: Admitting: Occupational Therapy

## 2023-11-12 ENCOUNTER — Ambulatory Visit

## 2023-11-12 DIAGNOSIS — R2689 Other abnormalities of gait and mobility: Secondary | ICD-10-CM

## 2023-11-12 DIAGNOSIS — R278 Other lack of coordination: Secondary | ICD-10-CM

## 2023-11-12 DIAGNOSIS — R2681 Unsteadiness on feet: Secondary | ICD-10-CM

## 2023-11-12 DIAGNOSIS — R262 Difficulty in walking, not elsewhere classified: Secondary | ICD-10-CM

## 2023-11-12 DIAGNOSIS — R41840 Attention and concentration deficit: Secondary | ICD-10-CM

## 2023-11-12 DIAGNOSIS — M6281 Muscle weakness (generalized): Secondary | ICD-10-CM

## 2023-11-12 DIAGNOSIS — R41844 Frontal lobe and executive function deficit: Secondary | ICD-10-CM

## 2023-11-12 DIAGNOSIS — R41841 Cognitive communication deficit: Secondary | ICD-10-CM | POA: Diagnosis not present

## 2023-11-12 NOTE — Therapy (Signed)
 OUTPATIENT PHYSICAL THERAPY TREATMENT   Adrian [name] Name: Adrian JAGIELLO MRN: [id number] DOB:[birth year], 68 y.o., male Today's Date: 11/12/2023  END OF SESSION:  PT End of Session - 11/12/23 1444     Visit Number 4    Number of Visits 17    Date for PT Re-Evaluation 12/26/23    Authorization Type MCR    Authorization Time Period 10/31/23 to 12/26/23    Progress Note Due on Visit 10    PT Start Time 1444    PT Stop Time 1530    PT Time Calculation (min) 46 min    Activity Tolerance Adrian [name] tolerated treatment well    Behavior During Therapy [facility] for tasks assessed/performed                Past Medical History:  Diagnosis Date   AF (paroxysmal atrial fibrillation) (HCC) 11/29/2018   Atrial flutter (HCC) 10/12/2015   a. TEE 3/17 with ? LAA clot-->s/p TEE/DCCV 11/24/2015; s/p DCCV 12/2022;s/p ablation 06/2023   CAD (coronary artery disease), native coronary artery 12/06/2015   a. 10/2015 MV: EF  37%, reversible defect inferior apex, intermediate risk findings; b. 10/2015 Cath: 20% mid RCA.   Carotid artery stenosis    1-39% right ICA stenosis and occluded left ICA   Colonic diverticular abscess    Diverticulitis    History of chemotherapy 2005   Cisplatin   Hypothyroidism    NICM (nonischemic cardiomyopathy) (HCC) 10/14/2015   a. Tachy mediated?;  b. Echo 3/17 - Mild concentric LVH, EF 30-35%, anteroseptal, anterior, anterolateral, apical anterior, lateral hypokinesis, trivial MR, mild to moderately reduced RVSF; c. LHC 3/17 - mRCA 20%   Radiation NOv.3,2005-Dec. 15, 2005   6810 cGy in 30 fractions   Tonsil cancer [facility]) 2005   Dr Otila Kluver.  XRT   Past Surgical History:  Procedure Laterality Date   A-FLUTTER ABLATION N/A 06/19/2023   Procedure: A-FLUTTER ABLATION;  Surgeon: Lanier Prude, MD;  Location: [facility] INVASIVE CV LAB;  Service: Cardiovascular;  Laterality: N/A;   APPENDECTOMY N/A 03/07/2019   Procedure: ROBOTIC ASSISTED APPENDECTOMY;  Surgeon: Karie Soda, MD;   Location: [facility] ORS;  Service: General;  Laterality: N/A;   CARDIAC CATHETERIZATION N/A 10/18/2015   Procedure: Left Heart Cath and Coronary Angiography;  Surgeon: Kathleene Hazel, MD;  Location: [facility] INVASIVE CV LAB;  Service: Cardiovascular;  Laterality: N/A;   CARDIOVERSION N/A 11/24/2015   Procedure: CARDIOVERSION;  Surgeon: Wendall Stade, MD;  Location: [facility] ENDOSCOPY;  Service: Cardiovascular;  Laterality: N/A;   CARDIOVERSION N/A 12/20/2022   Procedure: CARDIOVERSION;  Surgeon: [name], MD;  Location: [facility] INVASIVE CV LAB;  Service: Cardiovascular;  Laterality: N/A;   CYSTOSCOPY WITH STENT PLACEMENT Bilateral 03/07/2019   Procedure: CYSTOSCOPY WITH BILATERAL FIREFLY INJECTION;  Surgeon: Crist Fat, MD;  Location: [facility] ORS;  Service: Urology;  Laterality: Bilateral;   GASTROSTOMY TUBE PLACEMENT  07/04/2004   IR - G tube for tonsilar cancer   IR ANGIO INTRA EXTRACRAN SEL COM CAROTID INNOMINATE UNI R MOD SED  03/09/2019   IR ANGIO VERTEBRAL SEL VERTEBRAL UNI R MOD SED  03/09/2019   IR CT HEAD LTD  03/09/2019   IR PERCUTANEOUS ART THROMBECTOMY/INFUSION INTRACRANIAL INC DIAG ANGIO  03/09/2019   IR RADIOLOGIST EVAL & MGMT  12/18/2018   LAMINECTOMY     C5/placement of steel plate   LOOP RECORDER INSERTION N/A 06/19/2023   Procedure: LOOP RECORDER INSERTION;  Surgeon: Lanier Prude, MD;  Location: [facility] INVASIVE CV LAB;  Service: Cardiovascular;  Laterality: N/A;   NECK SURGERY  2003   replaced disk   RADIOLOGY WITH ANESTHESIA N/A 03/08/2019   Procedure: IR WITH ANESTHESIA;  Surgeon: Julieanne Cotton, MD;  Location: [facility] OR;  Service: Radiology;  Laterality: N/A;   TEE WITHOUT CARDIOVERSION N/A 10/15/2015   Procedure: TRANSESOPHAGEAL ECHOCARDIOGRAM (TEE);  Surgeon: Laurey Morale, MD;  Location: [facility] ENDOSCOPY;  Service: Cardiovascular;  Laterality: N/A;   TEE WITHOUT CARDIOVERSION N/A 11/24/2015   Procedure: TRANSESOPHAGEAL ECHOCARDIOGRAM (TEE);  Surgeon: Wendall Stade, MD;  Location: [facility]  ENDOSCOPY;  Service: Cardiovascular;  Laterality: N/A;   TEE WITHOUT CARDIOVERSION N/A 12/20/2022   Procedure: TRANSESOPHAGEAL ECHOCARDIOGRAM;  Surgeon: [name], MD;  Location: [facility] INVASIVE CV LAB;  Service: Cardiovascular;  Laterality: N/A;   XI ROBOTIC ASSISTED COLOSTOMY TAKEDOWN N/A 03/07/2019   Procedure: XI ROBOTIC ASSISTED LOW ANTERIOR RESECTION, RIGID PROCTOSCOPY;  Surgeon: Karie Soda, MD;  Location: [facility] ORS;  Service: General;  Laterality: N/A;   Adrian [name] Active Problem List   Diagnosis Date Noted   Carotid occlusion, left 10/23/2023   ICH (intracerebral hemorrhage) (HCC) 08/11/2023   Carotid artery stenosis    Atrial flutter (HCC) 12/19/2022   Essential hypertension 04/22/2021   Hypokalemia 03/12/2019   Expressive aphasia 03/10/2019   Dysphagia 03/10/2019   Middle cerebral artery embolism, left 03/09/2019   Diverticular stricture (HCC) 03/07/2019   Bacteria in urine    Chronic atrial fibrillation (HCC) 12/02/2018   Current use of long term anticoagulation 12/02/2018   UTI (urinary tract infection) 11/29/2018   Hyponatremia 11/29/2018   PAF (paroxysmal atrial fibrillation) (HCC) 11/29/2018   Diverticulitis of large intestine with abscess 09/23/2018   Dyslipidemia 12/05/2017   CAD (coronary artery disease), native coronary artery 12/06/2015   SOB (shortness of breath)    NICM (nonischemic cardiomyopathy) (HCC)    Thyroid activity decreased    Hypothyroidism 08/12/2013   History of radiation therapy 05/30/2012   Smokes tobacco daily 05/30/2012   Cancer of tonsillar fossa (HCC) 11/27/2011   Tonsil cancer (HCC) 2005   History of chemotherapy 2005    PCP: Evelena Peat MD   REFERRING PROVIDER: Erick Colace, MD  REFERRING DIAG: I61.0 (ICD-10-CM) - Nontraumatic subcortical hemorrhage of left cerebral hemisphere [facility])  THERAPY DIAG:  Muscle weakness (generalized)  Other lack of coordination  Unsteadiness on feet  Difficulty in walking, not elsewhere  classified  Other abnormalities of gait and mobility  Rationale for Evaluation and Treatment: Rehabilitation  ONSET DATE: 08/11/23  SUBJECTIVE:   SUBJECTIVE STATEMENT: Pt reports nothing new or different.   PERTINENT HISTORY: 1.  History of left basal ganglia hemorrhage approximately 2 and half months ago.  He has completed inpatient rehabilitation as well as home health.  I do think he would be a great candidate for outpatient therapy.  He has gotten back to modified independent level but still has some mild weakness on the right side as well as fine motor issues and motor control issues.  His balance is also off.  He continues wear right AFO because of foot and ankle weakness.  Adrian [name] has some short-term memory issues as well Will make referral for outpatient PT OT and speech therapy Physical medicine rehab follow-up in 3 months  PAIN:  Are you having pain? No  PRECAUTIONS: Fall and Other: R hemi, has loop recorder   RED FLAGS: None   WEIGHT BEARING RESTRICTIONS: No  FALLS:  Has Adrian [name] fallen in last 6 months? No  LIVING ENVIRONMENT: Lives with: lives  alone Lives in: House/apartment Stairs: 2 STE with rail  Has following equipment at home: Single point cane and Walker - 2 wheeled  OCCUPATION: retired- used to work for [employer]   PLOF: Independent, Independent with basic ADLs, Independent with gait, Independent with transfers, and Requires assistive device for independence  Adrian [name] GOALS: walk straight without a cane, get back to driving and motorcycling   NEXT MD VISIT: Referring 01/25/24  OBJECTIVE:  Note: Objective measures were completed at Evaluation unless otherwise noted.  DIAGNOSTIC FINDINGS:   CLINICAL DATA:  Stroke, follow up Neuro deficit, acute, stroke suspected   EXAM: MRI HEAD WITHOUT AND WITH CONTRAST   TECHNIQUE: Multiplanar, multiecho pulse sequences of the brain and surrounding structures were obtained without and with intravenous contrast.    CONTRAST:  6.72mL GADAVIST GADOBUTROL 1 MMOL/ML IV SOLN   COMPARISON:  CT head from today.   FINDINGS: Brain: When comparing across modalities, no substantial change in intraparenchymal hemorrhage in the left basal ganglia with intraventricular extension. No visible surrounding acute hemorrhage or mass lesion; however, acute blood products limits assessment. Prior left frontal infarct with encephalomalacia. No hydrocephalus. No pathologic enhancement.   Vascular: Major arterial flow voids are maintained at the skull base.   Skull and upper cervical spine: Normal marrow signal.   Sinuses/Orbits: Right frontal sinus opacification. Remaining sinuses are clear. No acute orbital findings.   Other: No mastoid effusions.   IMPRESSION: 1. When comparing across modalities, no substantial change in intraparenchymal hemorrhage in the left basal ganglia with intraventricular extension. No progressive mass effect. 2. No visible surrounding acute hemorrhage or mass lesion; however, acute blood products limits assessment  EXAM: CT HEAD WITHOUT CONTRAST   TECHNIQUE: Contiguous axial images were obtained from the base of the skull through the vertex without intravenous contrast.   RADIATION DOSE REDUCTION: This exam was performed according to the departmental dose-optimization program which includes automated exposure control, adjustment of the mA and/or kV according to Adrian [name] size and/or use of iterative reconstruction technique.   COMPARISON:  Head CT 08/12/2023   FINDINGS: Brain: Interval decrease in size of the previously seen hemorrhage in the left basal ganglia, now measuring 10 x 7 mm, previously 20 x 12 mm. There is resolution of intraventricular blood products. No hydrocephalus. No extra-axial fluid collection. No mass effect. No mass lesion. No CT evidence of an acute cortical infarct. There is chronic left MCA territory infarct involving the left opercular region.    Vascular: No hyperdense vessel or unexpected calcification.   Skull: Normal. Negative for fracture or focal lesion.   Sinuses/Orbits: No middle ear or mastoid effusion. Paranasal sinuses are notable for complete opacification of the right frontal sinus. Orbits are unremarkable.   Other: None.   IMPRESSION: No acute intracranial abnormality. Interval decrease in size of left basal ganglia hemorrhage, now measuring 10 x 7 mm, previously 20 x 12 mm. Resolution of intraventricular blood products. No hydrocephalus.    Adrian [name] SURVEYS:    Adrian [name]-Specific Activity Scoring Scheme  "0" represents "unable to perform." "10" represents "able to perform at prior level. 0 1 2 3 4 5 6 7 8 9  10 (Date and Score)   Activity Eval     1. Walking straight without the cane  4     2. Strength in RLE   4    3. Coordinated movements of R LE  3   4.    5.    Score 3.7    Total score = sum of the  activity scores/number of activities Minimum detectable change (90%CI) for average score = 2 points Minimum detectable change (90%CI) for single activity score = 3 points     COGNITION: Overall cognitive status: Within functional limits for tasks assessed     SENSATION: Not tested      LOWER EXTREMITY MMT:  MMT Right eval Left eval  Hip flexion 4 5  Hip extension    Hip abduction    Hip adduction    Hip internal rotation    Hip external rotation    Knee flexion 4 5  Knee extension 4 5  Ankle dorsiflexion 1 at best (in AFO, reports no movement in ankle)   Ankle plantarflexion    Ankle inversion    Ankle eversion     (Blank rows = not tested)    FUNCTIONAL TESTS:  5 times sit to stand: 12.8 seconds some off shift from R LE  Timed up and go (TUG): deferred  3 minute walk test: 235ft [facility]     10/31/23 0001  Standardized Balance Assessment  Standardized Balance Assessment Dynamic Gait Index  Dynamic Gait Index  Level Surface 2  Change in Gait Speed 1  Gait with  Horizontal Head Turns 2  Gait with Vertical Head Turns 2  Gait and Pivot Turn 1  Step Over Obstacle 1  Step Around Obstacles 1  Steps 0  Total Score 10    GAIT: Distance walked: 279ft  Assistive device utilized: Single point cane Level of assistance: Modified independence Comments: wide BOS, increased tone in R LE with gait pattern- limited knee ROM and ankle tends to invert a bit , mild unsteadiness with corners, limited rotation at trunk/hips                                                                                                                                 TREATMENT DATE:  11/12/23 Leg press 20# 2x10  NuStep L5 x all extremities, x3 just LE's  Side steps in bars Balance on airex  -eyes open -eyes closed -feet together -cone taps  -3 way taps  Step overs 4" stairs  Seated box taps 5# forward and to the side 6"    11/09/23 Standing Heel/toe raise 2x10 Gastroc stretch x 30" Soleus stretch x 30" Staggered stance fwd/bwd weight shifts 2x10 R&L Staggered stance heel/toe x10 3 way clock reach, wash rag under foot 2x10 R&L Amb SPC with R AFO, working on heel/toe pattern and symmetrical step lengths multiple 2x3 laps around gym, clockwise and counter clockwise  11/05/23 NuStep L5x63mins, did just LE after 3 mins  Step ups 4" Leg ext 3# 2x10 HS curls red 2x10 Rocker board  Side steps on beam STS x10 then with OHP   10/31/23  Eval, POC, HEP- lots of education on stroke recovery and 6 month window for optimal regains of strength/balance/function, encouraged him to keep doing HEP from HHPT as well as balance work  he found for himself, has been able to get on/off and use his TM safely at home so encouraged this as well  Nustep L5 x5 minutes BLEs only      Adrian [name] EDUCATION:  Education details: exam findings, POC, HEP, otherwise as above  Person educated: Adrian [name] Education method: Programmer, multimedia, Demonstration, and Handouts Education comprehension:  verbalized understanding, returned demonstration, and needs further education  HOME EXERCISE PROGRAM: Access Code: [email or web address] URL: [email or web address] Date: 11/09/2023 Prepared by: Vernon Prey April Kirstie Peri  Exercises - Heel Toe Raises with Counter Support  - 1 x daily - 7 x weekly - 2 sets - 10 reps - Stride Stance Weight Shift  - 1 x daily - 7 x weekly - 2 sets - 10 reps - Single Leg Balance with Clock Reach (with wash rag)  - 1 x daily - 7 x weekly - 2 sets - 10 reps  ASSESSMENT:  CLINICAL IMPRESSION: Treatment focused on improving R LE weight bearing, strength and coordination.With balance training he needed the bars for support with eyes closed feet together, and with cone taps and three way reaches.    From eval: Adrian [name] is a 68 y.o. M who was seen today for physical therapy evaluation and treatment for I61.0 (ICD-10-CM) - Nontraumatic subcortical hemorrhage of left cerebral hemisphere [facility]). He is very motivated and moving a very high level given the hemorrhagic CVA. His main concerns are balance/coordination and getting back to walking without assistive device. Anticipate he will do very well with skilled PT services.   OBJECTIVE IMPAIRMENTS: Abnormal gait, decreased activity tolerance, decreased balance, decreased coordination, decreased knowledge of use of DME, decreased mobility, difficulty walking, decreased strength, and decreased safety awareness.   ACTIVITY LIMITATIONS: standing, squatting, stairs, transfers, and locomotion level  PARTICIPATION LIMITATIONS: driving, shopping, community activity, and yard work  PERSONAL FACTORS: Age, Behavior pattern, Fitness, Past/current experiences, Social background, Time since onset of injury/illness/exacerbation, and Transportation are also affecting Adrian [name]'s functional outcome.   REHAB POTENTIAL: Good  CLINICAL DECISION MAKING: Evolving/moderate complexity  EVALUATION COMPLEXITY: Moderate   GOALS: Goals  reviewed with Adrian [name]? No  SHORT TERM GOALS: Target date: 11/28/2023   Will be compliant with appropriate progressive HEP  Baseline: Goal status: INITIAL  2.  Will score 14/24 on DGI  Baseline: 10 Goal status: INITIAL  3.  Will be able to perform all functional transfers without compensation strategies or offshift from hemi LE  Baseline:  Goal status: INITIAL  4.  Will be able to ambulate household distances without AD, Mod(I) Baseline:  Goal status: INITIAL    LONG TERM GOALS: Target date: 12/26/2023    MMT in R LE to be at least 4+/5 in hip and knee musculature  Baseline:  Goal status: INITIAL  2.  Will score 18/24 on DGI to show improved functional balance  Baseline:  Goal status: INITIAL  3.  Will be able to walk at least 238ft over uneven/unsteady surfaces without device and no more than S/ 584ft over even surfaces no device Mod(I) Baseline:  Goal status: INITIAL  4.  Will be able to ascend and descend steps without rail and minimal compensation strategies  Baseline:  Goal status: INITIAL  5.  Will be compliant with advanced HEP vs gym based program at DC to maintain functional gains  Baseline:  Goal status: INITIAL  6.  PSFS to improve by at least 3 points  Baseline: 3.7 Goal status: INITIAL   PLAN:  PT FREQUENCY: 2x/week  PT DURATION: 8 weeks  PLANNED INTERVENTIONS:  16109- PT Re-evaluation, 97110-Therapeutic exercises, 97530- Therapeutic activity, [id number]- Neuromuscular re-education, 97535- Self Care, [id number]- Manual therapy, [id number]- Gait training, [phone]- Orthotic Fit/training, [phone]- Aquatic Therapy, Stair training, Taping, Dry Needling, Cryotherapy, and Moist heat  PLAN FOR NEXT SESSION: strength, balance, gait training, functional conditioning.   Cassie Freer, [place], DPT 11/12/23 3:30 PM

## 2023-11-12 NOTE — Therapy (Signed)
 OUTPATIENT OCCUPATIONAL THERAPY NEURO TREATMENT  Patient Name: Adrian [name] MRN: [id number] DOB:May 26, 1956, 68 y.o., male Today's Date: 11/12/2023  PCP: Dr. Caryl Never REFERRING PROVIDER: Dr. Wynn Banker  END OF SESSION:  OT End of Session - 11/12/23 1659     Visit Number 3    Number of Visits 25    Date for OT Re-Evaluation 01/23/24    Authorization Type Medicare    Authorization - Visit Number 3    Progress Note Due on Visit 10    OT Start Time 1404    OT Stop Time 1443    OT Time Calculation (min) 39 min    Activity Tolerance Patient tolerated treatment well    Behavior During Therapy [facility] for tasks assessed/performed               Past Medical History:  Diagnosis Date   AF (paroxysmal atrial fibrillation) (HCC) 11/29/2018   Atrial flutter (HCC) 10/12/2015   a. TEE 3/17 with ? LAA clot-->s/p TEE/DCCV 11/24/2015; s/p DCCV 12/2022;s/p ablation 06/2023   CAD (coronary artery disease), native coronary artery 12/06/2015   a. 10/2015 MV: EF  37%, reversible defect inferior apex, intermediate risk findings; b. 10/2015 Cath: 20% mid RCA.   Carotid artery stenosis    1-39% right ICA stenosis and occluded left ICA   Colonic diverticular abscess    Diverticulitis    History of chemotherapy 2005   Cisplatin   Hypothyroidism    NICM (nonischemic cardiomyopathy) (HCC) 10/14/2015   a. Tachy mediated?;  b. Echo 3/17 - Mild concentric LVH, EF 30-35%, anteroseptal, anterior, anterolateral, apical anterior, lateral hypokinesis, trivial MR, mild to moderately reduced RVSF; c. LHC 3/17 - mRCA 20%   Radiation NOv.3,2005-Dec. 15, 2005   6810 cGy in 30 fractions   Tonsil cancer [facility]) 2005   Dr Otila Kluver.  XRT   Past Surgical History:  Procedure Laterality Date   A-FLUTTER ABLATION N/A 06/19/2023   Procedure: A-FLUTTER ABLATION;  Surgeon: Lanier Prude, MD;  Location: [facility] INVASIVE CV LAB;  Service: Cardiovascular;  Laterality: N/A;   APPENDECTOMY N/A 03/07/2019   Procedure: ROBOTIC  ASSISTED APPENDECTOMY;  Surgeon: Karie Soda, MD;  Location: [facility] ORS;  Service: General;  Laterality: N/A;   CARDIAC CATHETERIZATION N/A 10/18/2015   Procedure: Left Heart Cath and Coronary Angiography;  Surgeon: Kathleene Hazel, MD;  Location: [facility] INVASIVE CV LAB;  Service: Cardiovascular;  Laterality: N/A;   CARDIOVERSION N/A 11/24/2015   Procedure: CARDIOVERSION;  Surgeon: Wendall Stade, MD;  Location: [facility] ENDOSCOPY;  Service: Cardiovascular;  Laterality: N/A;   CARDIOVERSION N/A 12/20/2022   Procedure: CARDIOVERSION;  Surgeon: [name], MD;  Location: [facility] INVASIVE CV LAB;  Service: Cardiovascular;  Laterality: N/A;   CYSTOSCOPY WITH STENT PLACEMENT Bilateral 03/07/2019   Procedure: CYSTOSCOPY WITH BILATERAL FIREFLY INJECTION;  Surgeon: Crist Fat, MD;  Location: [facility] ORS;  Service: Urology;  Laterality: Bilateral;   GASTROSTOMY TUBE PLACEMENT  07/04/2004   IR - G tube for tonsilar cancer   IR ANGIO INTRA EXTRACRAN SEL COM CAROTID INNOMINATE UNI R MOD SED  03/09/2019   IR ANGIO VERTEBRAL SEL VERTEBRAL UNI R MOD SED  03/09/2019   IR CT HEAD LTD  03/09/2019   IR PERCUTANEOUS ART THROMBECTOMY/INFUSION INTRACRANIAL INC DIAG ANGIO  03/09/2019   IR RADIOLOGIST EVAL & MGMT  12/18/2018   LAMINECTOMY     C5/placement of steel plate   LOOP RECORDER INSERTION N/A 06/19/2023   Procedure: LOOP RECORDER INSERTION;  Surgeon: Lanier Prude, MD;  Location: [facility] INVASIVE CV LAB;  Service: Cardiovascular;  Laterality: N/A;   NECK SURGERY  2003   replaced disk   RADIOLOGY WITH ANESTHESIA N/A 03/08/2019   Procedure: IR WITH ANESTHESIA;  Surgeon: Julieanne Cotton, MD;  Location: [facility] OR;  Service: Radiology;  Laterality: N/A;   TEE WITHOUT CARDIOVERSION N/A 10/15/2015   Procedure: TRANSESOPHAGEAL ECHOCARDIOGRAM (TEE);  Surgeon: Laurey Morale, MD;  Location: [facility] ENDOSCOPY;  Service: Cardiovascular;  Laterality: N/A;   TEE WITHOUT CARDIOVERSION N/A 11/24/2015   Procedure: TRANSESOPHAGEAL ECHOCARDIOGRAM  (TEE);  Surgeon: Wendall Stade, MD;  Location: [facility] ENDOSCOPY;  Service: Cardiovascular;  Laterality: N/A;   TEE WITHOUT CARDIOVERSION N/A 12/20/2022   Procedure: TRANSESOPHAGEAL ECHOCARDIOGRAM;  Surgeon: [name], MD;  Location: [facility] INVASIVE CV LAB;  Service: Cardiovascular;  Laterality: N/A;   XI ROBOTIC ASSISTED COLOSTOMY TAKEDOWN N/A 03/07/2019   Procedure: XI ROBOTIC ASSISTED LOW ANTERIOR RESECTION, RIGID PROCTOSCOPY;  Surgeon: Karie Soda, MD;  Location: [facility] ORS;  Service: General;  Laterality: N/A;   Patient Active Problem List   Diagnosis Date Noted   Carotid occlusion, left 10/23/2023   ICH (intracerebral hemorrhage) (HCC) 08/11/2023   Carotid artery stenosis    Atrial flutter (HCC) 12/19/2022   Essential hypertension 04/22/2021   Hypokalemia 03/12/2019   Expressive aphasia 03/10/2019   Dysphagia 03/10/2019   Middle cerebral artery embolism, left 03/09/2019   Diverticular stricture (HCC) 03/07/2019   Bacteria in urine    Chronic atrial fibrillation (HCC) 12/02/2018   Current use of long term anticoagulation 12/02/2018   UTI (urinary tract infection) 11/29/2018   Hyponatremia 11/29/2018   PAF (paroxysmal atrial fibrillation) (HCC) 11/29/2018   Diverticulitis of large intestine with abscess 09/23/2018   Dyslipidemia 12/05/2017   CAD (coronary artery disease), native coronary artery 12/06/2015   SOB (shortness of breath)    NICM (nonischemic cardiomyopathy) (HCC)    Thyroid activity decreased    Hypothyroidism 08/12/2013   History of radiation therapy 05/30/2012   Smokes tobacco daily 05/30/2012   Cancer of tonsillar fossa (HCC) 11/27/2011   Tonsil cancer (HCC) 2005   History of chemotherapy 2005    ONSET DATE: 08/11/23  REFERRING DIAG: I61.0 (ICD-10-CM) - Nontraumatic subcortical hemorrhage of left cerebral hemisphere (HCC)   THERAPY DIAG:  Muscle weakness (generalized)  Other lack of coordination  Unsteadiness on feet  Other abnormalities of gait and  mobility  Frontal lobe and executive function deficit  Attention and concentration deficit  Rationale for Evaluation and Treatment: Rehabilitation  SUBJECTIVE:   SUBJECTIVE STATEMENT: Pt reports using his RUE for feeding  Pt accompanied by: self  PERTINENT HISTORY:   68 yo male hospitalized 08/11/23 with R sided weakness. CT showed a large basal ganglia ICH with IVH extension. PMH includes:  A-fib, carotid artery stenosis, CAD, tonsil cancer, hypothyroidism, L MCA CVA.    PRECAUTIONS: Fall  WEIGHT BEARING RESTRICTIONS: No  PAIN:  Are you having pain? No  FALLS: Has patient fallen in last 6 months? No  LIVING ENVIRONMENT: Lives with: lives alone has friends and ex wife checks in on him Lives in: House/apartment Stairs: no Has following equipment at home: Single point cane and Tour manager  PLOF: Independent  PATIENT GOALS: improve use of RUE  OBJECTIVE:  Note: Objective measures were completed at Evaluation unless otherwise noted.  HAND DOMINANCE: Right  ADLs: Overall ADLs: mod I with all basic ADLS Transfers/ambulation related to ADLs: Eating: uses RUE 20%, currently using non dominant LUE,  Grooming: using non dominant LUE UB Dressing:  mod I difficulty with buttons LB Dressing: mod I Toileting: mod I with cane Bathing: has tub bench Tub Shower transfers: mod I  Equipment: Transfer tub bench  IADLs: Shopping: needs assist for transportation Light housekeeping: Pt is performing light cleaning, he has occasional assist Meal Prep: microwave meals primarily, does a little stove top Medication management: pt handles  Financial management: pt handles Handwriting: 90% legibility, handwriting is small   MOBILITY STATUS:  mod I with cane  ACTIVITY TOLERANCE: Activity tolerance: standing  UPPER EXTREMITY ROM:  grossly WFLS  UPPER EXTREMITY MMT:     MMT Right eval Left eval  Shoulder flexion 3+/5 4+/5  Shoulder abduction    Shoulder adduction     Shoulder extension    Shoulder internal rotation    Shoulder external rotation    Middle trapezius    Lower trapezius    Elbow flexion 4-/5 4-/5  Elbow extension 4-/5 4-/5  Wrist flexion    Wrist extension    Wrist ulnar deviation    Wrist radial deviation    Wrist pronation    Wrist supination    (Blank rows = not tested)  HAND FUNCTION: Grip strength: Right: 50 lbs; Left: 63 lbs  COORDINATION: 9 Hole Peg test: Right: 59.69 sec; Left: 29.35 sec  SENSATION:diminished in RUE  COGNITION: Overall cognitive status:unable to spell WORLD backwards correctly, recalls 2/3 words following a delay.  VISION ASSESSMENT: Not tested- denies visual changes   OBSERVATIONS: Pleasant gentleman motivated to improve                                                                                                                              TREATMENT DATE: 11/12/23- Reviewed cane HEP 10-15 reps min v.c Functional reaching to place and retrieve clothespins(1-8#) from overhead target(vertical yardstick) with RUE for functional reach and sustained pinch, min difficulty, v.c Placing metal pegs( 3 sizes) into pegboard with RUE then removing with in hand manipulation, min difficulty/ v.c  Flipping and dealing cards with RUE for increased fine motor coordination, min v.c   11/09/23: Pt instructed in cane HEP.  Pt fatigued quickly and with difficulty with control, but responded well to cueing.  Also reviewed and added to coordination HEP.  Pt able to copy shopping list with good legibility, but several spelling errors--pt reports difficulty with spelling if generating a list at home.   10/31/23- eval     PATIENT EDUCATION: Education details:  Software engineer review Person educated: Patient Education method: Programmer, multimedia, Facilities manager, Actor cues, Verbal cues, and Handouts Education comprehension: verbalized understanding, returned demonstration, and verbal cues required  HOME EXERCISE  PROGRAM: beginning coordination 3/26 11/09/23  Cane HEP   GOALS: Goals reviewed with patient? Yes  SHORT TERM GOALS: Target date: 12/01/23  I with inital HEP Goal status: ongoing  2.  Pt will report feeding himself at least 50% of the time with RUE. Baseline: uses 20% x Goal status: INITIAL  3.  Pt  will demonstrate improved RUE fine motor coordination for ADLs as evidenced by decreasing 9 hole peg test score to 52 secs or less Goal status: INITIAL  4.  Pt will report consistently brushing his teeth with RUE Goal status: INITIAL  5.  I with memory compensation strategies Goal status: INITIAL  6.  Pt will report that he has resumed moderate level home management mod  Goal status: INITIAL   LONG TERM GOALS: Target date: 01/23/24  I with updated HEP Goal status: INITIAL  2.  Pt will demonstrate improved RUE fine motor coordination for ADLs as evidenced by decreasing 9 hole peg test score to 48 secs or less Goal status: INITIAL  3.  Pt will demonstrate ability to carry a plate in RUE without spills. Goal status: INITIAL  4.  Pt will write a short paragraph with 100% legibility and minimal decrease in letter size. Goal status: INITIAL  5.  Pt will resume use of RUE as dominant hand at least 75% of the time for ADLs/ IADLs. Goal status: INITIAL  6. Pt will retrieve a 3 lbs object from eye level shelf using RUE with good control  Goal status: inital  ASSESSMENT:  CLINICAL IMPRESSION: Pt is progressing towards goals with improving RUE coordination and control.  PERFORMANCE DEFICITS: in functional skills including ADLs, IADLs, coordination, dexterity, sensation, strength, flexibility, Fine motor control, Gross motor control, mobility, balance, endurance, decreased knowledge of precautions, decreased knowledge of use of DME, and UE functional use, cognitive skills including attention, memory, problem solving, safety awareness, and thought, and psychosocial skills including  coping strategies, environmental adaptation, habits, interpersonal interactions, and routines and behaviors.   IMPAIRMENTS: are limiting patient from ADLs, IADLs, play, leisure, and social participation.   CO-MORBIDITIES: may have co-morbidities  that affects occupational performance. Patient will benefit from skilled OT to address above impairments and improve overall function.  MODIFICATION OR ASSISTANCE TO COMPLETE EVALUATION: No modification of tasks or assist necessary to complete an evaluation.  OT OCCUPATIONAL PROFILE AND HISTORY: Detailed assessment: Review of records and additional review of physical, cognitive, psychosocial history related to current functional performance.  CLINICAL DECISION MAKING: LOW - limited treatment options, no task modification necessary  REHAB POTENTIAL: Good  EVALUATION COMPLEXITY: Low    PLAN:  OT FREQUENCY: 2x/week  OT DURATION: 12 weeks  PLANNED INTERVENTIONS: 97168 OT Re-evaluation, 97535 self care/ADL training, 09811 therapeutic exercise, 97530 therapeutic activity, 97112 neuromuscular re-education, 97140 manual therapy, 97113 aquatic therapy, 97035 ultrasound, 97018 paraffin, 91478 moist heat, 97010 cryotherapy, 97034 contrast bath, 97014 electrical stimulation unattended, 97129 Cognitive training (first 15 min), 29562 Cognitive training(each additional 15 min), 13086 Orthotics management and training, 57846 Splinting (initial encounter), passive range of motion, balance training, functional mobility training, energy conservation, coping strategies training, patient/family education, and DME and/or AE instructions  RECOMMENDED OTHER SERVICES: n/a  CONSULTED AND AGREED WITH PLAN OF CARE: Patient  PLAN FOR NEXT SESSION: continue with RUE coordination and control    Deleon Passe, OTR/L 11/12/2023, 5:02 PM

## 2023-11-14 LAB — CUP PACEART REMOTE DEVICE CHECK
Date Time Interrogation Session: 20250408093300
Implantable Pulse Generator Implant Date: 20241112
Pulse Gen Serial Number: 126890

## 2023-11-14 NOTE — Addendum Note (Signed)
 Addended by: Christy Sartorius on: 11/14/2023 07:14 AM   Modules accepted: Orders

## 2023-11-15 ENCOUNTER — Ambulatory Visit: Admitting: Speech Pathology

## 2023-11-15 ENCOUNTER — Ambulatory Visit: Admitting: Occupational Therapy

## 2023-11-15 ENCOUNTER — Ambulatory Visit: Admitting: Physical Therapy

## 2023-11-15 ENCOUNTER — Encounter: Payer: Self-pay | Admitting: Occupational Therapy

## 2023-11-15 ENCOUNTER — Encounter: Payer: Self-pay | Admitting: Speech Pathology

## 2023-11-15 ENCOUNTER — Encounter: Payer: Self-pay | Admitting: Physical Therapy

## 2023-11-15 DIAGNOSIS — R2681 Unsteadiness on feet: Secondary | ICD-10-CM

## 2023-11-15 DIAGNOSIS — R278 Other lack of coordination: Secondary | ICD-10-CM

## 2023-11-15 DIAGNOSIS — R41844 Frontal lobe and executive function deficit: Secondary | ICD-10-CM

## 2023-11-15 DIAGNOSIS — M6281 Muscle weakness (generalized): Secondary | ICD-10-CM

## 2023-11-15 DIAGNOSIS — R2689 Other abnormalities of gait and mobility: Secondary | ICD-10-CM

## 2023-11-15 DIAGNOSIS — R4701 Aphasia: Secondary | ICD-10-CM

## 2023-11-15 DIAGNOSIS — R41840 Attention and concentration deficit: Secondary | ICD-10-CM

## 2023-11-15 DIAGNOSIS — R41841 Cognitive communication deficit: Secondary | ICD-10-CM | POA: Diagnosis not present

## 2023-11-15 DIAGNOSIS — R471 Dysarthria and anarthria: Secondary | ICD-10-CM

## 2023-11-15 NOTE — Therapy (Signed)
 OUTPATIENT OCCUPATIONAL THERAPY NEURO TREATMENT  Patient Name: Adrian [name] MRN: [id number] DOB:04-13-1956, 68 y.o., male Today's Date: 11/15/2023  PCP: Dr. Caryl Never REFERRING PROVIDER: Dr. Wynn Banker  END OF SESSION:  OT End of Session - 11/15/23 0811     Visit Number 4    Number of Visits 25    Date for OT Re-Evaluation 01/23/24    Authorization Type Medicare    Authorization - Visit Number 4    Progress Note Due on Visit 10    OT Start Time 0806    OT Stop Time 0845    OT Time Calculation (min) 39 min    Activity Tolerance Patient tolerated treatment well    Behavior During Therapy [facility] for tasks assessed/performed               Past Medical History:  Diagnosis Date   AF (paroxysmal atrial fibrillation) (HCC) 11/29/2018   Atrial flutter (HCC) 10/12/2015   a. TEE 3/17 with ? LAA clot-->s/p TEE/DCCV 11/24/2015; s/p DCCV 12/2022;s/p ablation 06/2023   CAD (coronary artery disease), native coronary artery 12/06/2015   a. 10/2015 MV: EF  37%, reversible defect inferior apex, intermediate risk findings; b. 10/2015 Cath: 20% mid RCA.   Carotid artery stenosis    1-39% right ICA stenosis and occluded left ICA   Colonic diverticular abscess    Diverticulitis    History of chemotherapy 2005   Cisplatin   Hypothyroidism    NICM (nonischemic cardiomyopathy) (HCC) 10/14/2015   a. Tachy mediated?;  b. Echo 3/17 - Mild concentric LVH, EF 30-35%, anteroseptal, anterior, anterolateral, apical anterior, lateral hypokinesis, trivial MR, mild to moderately reduced RVSF; c. LHC 3/17 - mRCA 20%   Radiation NOv.3,2005-Dec. 15, 2005   6810 cGy in 30 fractions   Tonsil cancer [facility]) 2005   Dr Otila Kluver.  XRT   Past Surgical History:  Procedure Laterality Date   A-FLUTTER ABLATION N/A 06/19/2023   Procedure: A-FLUTTER ABLATION;  Surgeon: Lanier Prude, MD;  Location: [facility] INVASIVE CV LAB;  Service: Cardiovascular;  Laterality: N/A;   APPENDECTOMY N/A 03/07/2019   Procedure: ROBOTIC  ASSISTED APPENDECTOMY;  Surgeon: Karie Soda, MD;  Location: [facility] ORS;  Service: General;  Laterality: N/A;   CARDIAC CATHETERIZATION N/A 10/18/2015   Procedure: Left Heart Cath and Coronary Angiography;  Surgeon: Kathleene Hazel, MD;  Location: [facility] INVASIVE CV LAB;  Service: Cardiovascular;  Laterality: N/A;   CARDIOVERSION N/A 11/24/2015   Procedure: CARDIOVERSION;  Surgeon: Wendall Stade, MD;  Location: [facility] ENDOSCOPY;  Service: Cardiovascular;  Laterality: N/A;   CARDIOVERSION N/A 12/20/2022   Procedure: CARDIOVERSION;  Surgeon: [name], MD;  Location: [facility] INVASIVE CV LAB;  Service: Cardiovascular;  Laterality: N/A;   CYSTOSCOPY WITH STENT PLACEMENT Bilateral 03/07/2019   Procedure: CYSTOSCOPY WITH BILATERAL FIREFLY INJECTION;  Surgeon: Crist Fat, MD;  Location: [facility] ORS;  Service: Urology;  Laterality: Bilateral;   GASTROSTOMY TUBE PLACEMENT  07/04/2004   IR - G tube for tonsilar cancer   IR ANGIO INTRA EXTRACRAN SEL COM CAROTID INNOMINATE UNI R MOD SED  03/09/2019   IR ANGIO VERTEBRAL SEL VERTEBRAL UNI R MOD SED  03/09/2019   IR CT HEAD LTD  03/09/2019   IR PERCUTANEOUS ART THROMBECTOMY/INFUSION INTRACRANIAL INC DIAG ANGIO  03/09/2019   IR RADIOLOGIST EVAL & MGMT  12/18/2018   LAMINECTOMY     C5/placement of steel plate   LOOP RECORDER INSERTION N/A 06/19/2023   Procedure: LOOP RECORDER INSERTION;  Surgeon: Lanier Prude, MD;  Location: [facility] INVASIVE CV LAB;  Service: Cardiovascular;  Laterality: N/A;   NECK SURGERY  2003   replaced disk   RADIOLOGY WITH ANESTHESIA N/A 03/08/2019   Procedure: IR WITH ANESTHESIA;  Surgeon: Julieanne Cotton, MD;  Location: [facility] OR;  Service: Radiology;  Laterality: N/A;   TEE WITHOUT CARDIOVERSION N/A 10/15/2015   Procedure: TRANSESOPHAGEAL ECHOCARDIOGRAM (TEE);  Surgeon: Laurey Morale, MD;  Location: [facility] ENDOSCOPY;  Service: Cardiovascular;  Laterality: N/A;   TEE WITHOUT CARDIOVERSION N/A 11/24/2015   Procedure: TRANSESOPHAGEAL ECHOCARDIOGRAM  (TEE);  Surgeon: Wendall Stade, MD;  Location: [facility] ENDOSCOPY;  Service: Cardiovascular;  Laterality: N/A;   TEE WITHOUT CARDIOVERSION N/A 12/20/2022   Procedure: TRANSESOPHAGEAL ECHOCARDIOGRAM;  Surgeon: [name], MD;  Location: [facility] INVASIVE CV LAB;  Service: Cardiovascular;  Laterality: N/A;   XI ROBOTIC ASSISTED COLOSTOMY TAKEDOWN N/A 03/07/2019   Procedure: XI ROBOTIC ASSISTED LOW ANTERIOR RESECTION, RIGID PROCTOSCOPY;  Surgeon: Karie Soda, MD;  Location: [facility] ORS;  Service: General;  Laterality: N/A;   Patient Active Problem List   Diagnosis Date Noted   Carotid occlusion, left 10/23/2023   ICH (intracerebral hemorrhage) (HCC) 08/11/2023   Carotid artery stenosis    Atrial flutter (HCC) 12/19/2022   Essential hypertension 04/22/2021   Hypokalemia 03/12/2019   Expressive aphasia 03/10/2019   Dysphagia 03/10/2019   Middle cerebral artery embolism, left 03/09/2019   Diverticular stricture (HCC) 03/07/2019   Bacteria in urine    Chronic atrial fibrillation (HCC) 12/02/2018   Current use of long term anticoagulation 12/02/2018   UTI (urinary tract infection) 11/29/2018   Hyponatremia 11/29/2018   PAF (paroxysmal atrial fibrillation) (HCC) 11/29/2018   Diverticulitis of large intestine with abscess 09/23/2018   Dyslipidemia 12/05/2017   CAD (coronary artery disease), native coronary artery 12/06/2015   SOB (shortness of breath)    NICM (nonischemic cardiomyopathy) (HCC)    Thyroid activity decreased    Hypothyroidism 08/12/2013   History of radiation therapy 05/30/2012   Smokes tobacco daily 05/30/2012   Cancer of tonsillar fossa (HCC) 11/27/2011   Tonsil cancer (HCC) 2005   History of chemotherapy 2005    ONSET DATE: 08/11/23  REFERRING DIAG: I61.0 (ICD-10-CM) - Nontraumatic subcortical hemorrhage of left cerebral hemisphere (HCC)   THERAPY DIAG:  No diagnosis found.  Rationale for Evaluation and Treatment: Rehabilitation  SUBJECTIVE:   SUBJECTIVE STATEMENT: Pt  denies pain  Pt accompanied by: self  PERTINENT HISTORY:   68 yo male hospitalized 08/11/23 with R sided weakness. CT showed a large basal ganglia ICH with IVH extension. PMH includes:  A-fib, carotid artery stenosis, CAD, tonsil cancer, hypothyroidism, L MCA CVA.    PRECAUTIONS: Fall  WEIGHT BEARING RESTRICTIONS: No  PAIN:  Are you having pain? No  FALLS: Has patient fallen in last 6 months? No  LIVING ENVIRONMENT: Lives with: lives alone has friends and ex wife checks in on him Lives in: House/apartment Stairs: no Has following equipment at home: Single point cane and Tour manager  PLOF: Independent  PATIENT GOALS: improve use of RUE  OBJECTIVE:  Note: Objective measures were completed at Evaluation unless otherwise noted.  HAND DOMINANCE: Right  ADLs: Overall ADLs: mod I with all basic ADLS Transfers/ambulation related to ADLs: Eating: uses RUE 20%, currently using non dominant LUE,  Grooming: using non dominant LUE UB Dressing: mod I difficulty with buttons LB Dressing: mod I Toileting: mod I with cane Bathing: has tub bench Tub Shower transfers: mod I  Equipment: Transfer tub bench  IADLs: Shopping: needs  assist for transportation Light housekeeping: Pt is performing light cleaning, he has occasional assist Meal Prep: microwave meals primarily, does a little stove top Medication management: pt handles  Financial management: pt handles Handwriting: 90% legibility, handwriting is small   MOBILITY STATUS:  mod I with cane  ACTIVITY TOLERANCE: Activity tolerance: standing  UPPER EXTREMITY ROM:  grossly WFLS  UPPER EXTREMITY MMT:     MMT Right eval Left eval  Shoulder flexion 3+/5 4+/5  Shoulder abduction    Shoulder adduction    Shoulder extension    Shoulder internal rotation    Shoulder external rotation    Middle trapezius    Lower trapezius    Elbow flexion 4-/5 4-/5  Elbow extension 4-/5 4-/5  Wrist flexion    Wrist extension    Wrist  ulnar deviation    Wrist radial deviation    Wrist pronation    Wrist supination    (Blank rows = not tested)  HAND FUNCTION: Grip strength: Right: 50 lbs; Left: 63 lbs  COORDINATION: 9 Hole Peg test: Right: 59.69 sec; Left: 29.35 sec  SENSATION:diminished in RUE  COGNITION: Overall cognitive status:unable to spell WORLD backwards correctly, recalls 2/3 words following a delay.  VISION ASSESSMENT: Not tested- denies visual changes   OBSERVATIONS: Pleasant gentleman motivated to improve                                                                                                                              TREATMENT DATE: 11/15/23 UBE x 6 mins level 1 for conditioning closed chain shoulder flexion with medium ball x 15 reps then with foam roll for send set min v.c for control. Yellow theraband for biceps curls and triceps extension, 10-15 reps each UE, min v.c copying small peg design with RUE min-mod difficulty/ drops, min v.c to avoid compensation.  11/12/23- Reviewed cane HEP 10-15 reps min v.c Functional reaching to place and retrieve clothespins(1-8#) from overhead target(vertical yardstick) with RUE for functional reach and sustained pinch, min difficulty, v.c Placing metal pegs( 3 sizes) into pegboard with RUE then removing with in hand manipulation, min difficulty/ v.c  Flipping and dealing cards with RUE for increased fine motor coordination, min v.c   11/09/23: Pt instructed in cane HEP.  Pt fatigued quickly and with difficulty with control, but responded well to cueing.  Also reviewed and added to coordination HEP.  Pt able to copy shopping list with good legibility, but several spelling errors--pt reports difficulty with spelling if generating a list at home.   10/31/23- eval     PATIENT EDUCATION: Education details:   yellow theraband and shoulder flexion with paper towel roll Person educated: Patient Education method: Explanation, Demonstration, Tactile cues,  Verbal cues, and Handouts Education comprehension: verbalized understanding, returned demonstration, and verbal cues required  HOME EXERCISE PROGRAM: beginning coordination 3/26 11/09/23  Cane HEP 4/10- biceps triceps with yellow band, shoulder flexion with paper towel roll  GOALS: Goals reviewed  with patient? Yes  SHORT TERM GOALS: Target date: 12/01/23  I with inital HEP Goal status: met for cane  2.  Pt will report feeding himself at least 50% of the time with RUE. Baseline: uses 20% x Goal status:  ongoing  3.  Pt will demonstrate improved RUE fine motor coordination for ADLs as evidenced by decreasing 9 hole peg test score to 52 secs or less Goal status: ongoing  4.  Pt will report consistently brushing his teeth with RUE Goal status: ongoing  5.  I with memory compensation strategies Goal status: ongoing  6.  Pt will report that he has resumed moderate level home management mod  Goal status: ongoing   LONG TERM GOALS: Target date: 01/23/24  I with updated HEP Goal status: INITIAL  2.  Pt will demonstrate improved RUE fine motor coordination for ADLs as evidenced by decreasing 9 hole peg test score to 48 secs or less Goal status: INITIAL  3.  Pt will demonstrate ability to carry a plate in RUE without spills. Goal status: INITIAL  4.  Pt will write a short paragraph with 100% legibility and minimal decrease in letter size. Goal status: INITIAL  5.  Pt will resume use of RUE as dominant hand at least 75% of the time for ADLs/ IADLs. Goal status: INITIAL  6. Pt will retrieve a 3 lbs object from eye level shelf using RUE with good control  Goal status: inital  ASSESSMENT:  CLINICAL IMPRESSION: Pt is progressing towards goals. He demonstrates understanding of updates to HEP.  PERFORMANCE DEFICITS: in functional skills including ADLs, IADLs, coordination, dexterity, sensation, strength, flexibility, Fine motor control, Gross motor control, mobility, balance,  endurance, decreased knowledge of precautions, decreased knowledge of use of DME, and UE functional use, cognitive skills including attention, memory, problem solving, safety awareness, and thought, and psychosocial skills including coping strategies, environmental adaptation, habits, interpersonal interactions, and routines and behaviors.   IMPAIRMENTS: are limiting patient from ADLs, IADLs, play, leisure, and social participation.   CO-MORBIDITIES: may have co-morbidities  that affects occupational performance. Patient will benefit from skilled OT to address above impairments and improve overall function.  MODIFICATION OR ASSISTANCE TO COMPLETE EVALUATION: No modification of tasks or assist necessary to complete an evaluation.  OT OCCUPATIONAL PROFILE AND HISTORY: Detailed assessment: Review of records and additional review of physical, cognitive, psychosocial history related to current functional performance.  CLINICAL DECISION MAKING: LOW - limited treatment options, no task modification necessary  REHAB POTENTIAL: Good  EVALUATION COMPLEXITY: Low    PLAN:  OT FREQUENCY: 2x/week  OT DURATION: 12 weeks  PLANNED INTERVENTIONS: 97168 OT Re-evaluation, 97535 self care/ADL training, 02725 therapeutic exercise, 97530 therapeutic activity, 97112 neuromuscular re-education, 97140 manual therapy, 97113 aquatic therapy, 97035 ultrasound, 97018 paraffin, 36644 moist heat, 97010 cryotherapy, 97034 contrast bath, 97014 electrical stimulation unattended, 97129 Cognitive training (first 15 min), 03474 Cognitive training(each additional 15 min), 25956 Orthotics management and training, 38756 Splinting (initial encounter), passive range of motion, balance training, functional mobility training, energy conservation, coping strategies training, patient/family education, and DME and/or AE instructions  RECOMMENDED OTHER SERVICES: n/a  CONSULTED AND AGREED WITH PLAN OF CARE: Patient  PLAN FOR NEXT  SESSION: continue to address RUE functional reach and coordiantion   Rindi Beechy, OTR/L 11/15/2023, 8:12 AM

## 2023-11-15 NOTE — Patient Instructions (Signed)
   SHOULDER: Flexion Bilateral    Raise arms to shoulder height holding paper towel roll at same speed. Keep elbows straight. 10-15___ reps per set, _1-2__ sets per day, _7__ days per week Maintain object in midline. Slow down forcus on control        Elbow Flexion: Resisted   With tubing held in __one____ hand(s) and other end secured under foot, curl arm up as far as possible. Repeat _10___ times per set. Do _1-2___ sessions per day, every other day.    Elbow Extension: Resisted   Sit in chair with resistive band secured at armrest (or hold with other hand) and __one_____ elbow bent. Straighten elbow. Repeat _10___ times per set.  Do _1-2___ sessions per day, every other day.   Copyright  VHI. All rights reserved.

## 2023-11-15 NOTE — Therapy (Signed)
 OUTPATIENT PHYSICAL THERAPY TREATMENT   Patient Name: Adrian [name] MRN: [id number] DOB:1956-02-14, 68 y.o., male Today's Date: 11/15/2023  END OF SESSION:  PT End of Session - 11/15/23 0941     Visit Number 5    Number of Visits 17    Date for PT Re-Evaluation 12/26/23    Authorization Type MCR    Authorization Time Period 10/31/23 to 12/26/23    Progress Note Due on Visit 10    PT Start Time 0931    PT Stop Time 1010    PT Time Calculation (min) 39 min    Activity Tolerance Patient tolerated treatment well    Behavior During Therapy [facility] for tasks assessed/performed                 Past Medical History:  Diagnosis Date   AF (paroxysmal atrial fibrillation) (HCC) 11/29/2018   Atrial flutter (HCC) 10/12/2015   a. TEE 3/17 with ? LAA clot-->s/p TEE/DCCV 11/24/2015; s/p DCCV 12/2022;s/p ablation 06/2023   CAD (coronary artery disease), native coronary artery 12/06/2015   a. 10/2015 MV: EF  37%, reversible defect inferior apex, intermediate risk findings; b. 10/2015 Cath: 20% mid RCA.   Carotid artery stenosis    1-39% right ICA stenosis and occluded left ICA   Colonic diverticular abscess    Diverticulitis    History of chemotherapy 2005   Cisplatin   Hypothyroidism    NICM (nonischemic cardiomyopathy) (HCC) 10/14/2015   a. Tachy mediated?;  b. Echo 3/17 - Mild concentric LVH, EF 30-35%, anteroseptal, anterior, anterolateral, apical anterior, lateral hypokinesis, trivial MR, mild to moderately reduced RVSF; c. LHC 3/17 - mRCA 20%   Radiation NOv.3,2005-Dec. 15, 2005   6810 cGy in 30 fractions   Tonsil cancer [facility]) 2005   Dr Otila Kluver.  XRT   Past Surgical History:  Procedure Laterality Date   A-FLUTTER ABLATION N/A 06/19/2023   Procedure: A-FLUTTER ABLATION;  Surgeon: Lanier Prude, MD;  Location: [facility] INVASIVE CV LAB;  Service: Cardiovascular;  Laterality: N/A;   APPENDECTOMY N/A 03/07/2019   Procedure: ROBOTIC ASSISTED APPENDECTOMY;  Surgeon: Karie Soda, MD;   Location: [facility] ORS;  Service: General;  Laterality: N/A;   CARDIAC CATHETERIZATION N/A 10/18/2015   Procedure: Left Heart Cath and Coronary Angiography;  Surgeon: Kathleene Hazel, MD;  Location: [facility] INVASIVE CV LAB;  Service: Cardiovascular;  Laterality: N/A;   CARDIOVERSION N/A 11/24/2015   Procedure: CARDIOVERSION;  Surgeon: Wendall Stade, MD;  Location: [facility] ENDOSCOPY;  Service: Cardiovascular;  Laterality: N/A;   CARDIOVERSION N/A 12/20/2022   Procedure: CARDIOVERSION;  Surgeon: [name], MD;  Location: [facility] INVASIVE CV LAB;  Service: Cardiovascular;  Laterality: N/A;   CYSTOSCOPY WITH STENT PLACEMENT Bilateral 03/07/2019   Procedure: CYSTOSCOPY WITH BILATERAL FIREFLY INJECTION;  Surgeon: Crist Fat, MD;  Location: [facility] ORS;  Service: Urology;  Laterality: Bilateral;   GASTROSTOMY TUBE PLACEMENT  07/04/2004   IR - G tube for tonsilar cancer   IR ANGIO INTRA EXTRACRAN SEL COM CAROTID INNOMINATE UNI R MOD SED  03/09/2019   IR ANGIO VERTEBRAL SEL VERTEBRAL UNI R MOD SED  03/09/2019   IR CT HEAD LTD  03/09/2019   IR PERCUTANEOUS ART THROMBECTOMY/INFUSION INTRACRANIAL INC DIAG ANGIO  03/09/2019   IR RADIOLOGIST EVAL & MGMT  12/18/2018   LAMINECTOMY     C5/placement of steel plate   LOOP RECORDER INSERTION N/A 06/19/2023   Procedure: LOOP RECORDER INSERTION;  Surgeon: Lanier Prude, MD;  Location: [facility] INVASIVE CV  LAB;  Service: Cardiovascular;  Laterality: N/A;   NECK SURGERY  2003   replaced disk   RADIOLOGY WITH ANESTHESIA N/A 03/08/2019   Procedure: IR WITH ANESTHESIA;  Surgeon: Julieanne Cotton, MD;  Location: [facility] OR;  Service: Radiology;  Laterality: N/A;   TEE WITHOUT CARDIOVERSION N/A 10/15/2015   Procedure: TRANSESOPHAGEAL ECHOCARDIOGRAM (TEE);  Surgeon: Laurey Morale, MD;  Location: [facility] ENDOSCOPY;  Service: Cardiovascular;  Laterality: N/A;   TEE WITHOUT CARDIOVERSION N/A 11/24/2015   Procedure: TRANSESOPHAGEAL ECHOCARDIOGRAM (TEE);  Surgeon: Wendall Stade, MD;  Location: [facility]  ENDOSCOPY;  Service: Cardiovascular;  Laterality: N/A;   TEE WITHOUT CARDIOVERSION N/A 12/20/2022   Procedure: TRANSESOPHAGEAL ECHOCARDIOGRAM;  Surgeon: [name], MD;  Location: [facility] INVASIVE CV LAB;  Service: Cardiovascular;  Laterality: N/A;   XI ROBOTIC ASSISTED COLOSTOMY TAKEDOWN N/A 03/07/2019   Procedure: XI ROBOTIC ASSISTED LOW ANTERIOR RESECTION, RIGID PROCTOSCOPY;  Surgeon: Karie Soda, MD;  Location: [facility] ORS;  Service: General;  Laterality: N/A;   Patient Active Problem List   Diagnosis Date Noted   Carotid occlusion, left 10/23/2023   ICH (intracerebral hemorrhage) (HCC) 08/11/2023   Carotid artery stenosis    Atrial flutter (HCC) 12/19/2022   Essential hypertension 04/22/2021   Hypokalemia 03/12/2019   Expressive aphasia 03/10/2019   Dysphagia 03/10/2019   Middle cerebral artery embolism, left 03/09/2019   Diverticular stricture (HCC) 03/07/2019   Bacteria in urine    Chronic atrial fibrillation (HCC) 12/02/2018   Current use of long term anticoagulation 12/02/2018   UTI (urinary tract infection) 11/29/2018   Hyponatremia 11/29/2018   PAF (paroxysmal atrial fibrillation) (HCC) 11/29/2018   Diverticulitis of large intestine with abscess 09/23/2018   Dyslipidemia 12/05/2017   CAD (coronary artery disease), native coronary artery 12/06/2015   SOB (shortness of breath)    NICM (nonischemic cardiomyopathy) (HCC)    Thyroid activity decreased    Hypothyroidism 08/12/2013   History of radiation therapy 05/30/2012   Smokes tobacco daily 05/30/2012   Cancer of tonsillar fossa (HCC) 11/27/2011   Tonsil cancer (HCC) 2005   History of chemotherapy 2005    PCP: Evelena Peat MD   REFERRING PROVIDER: Erick Colace, MD  REFERRING DIAG: I61.0 (ICD-10-CM) - Nontraumatic subcortical hemorrhage of left cerebral hemisphere [facility])  THERAPY DIAG:  Muscle weakness (generalized)  Other lack of coordination  Unsteadiness on feet  Rationale for Evaluation and  Treatment: Rehabilitation  ONSET DATE: 08/11/23  SUBJECTIVE:   SUBJECTIVE STATEMENT:   Nothing really new, no falls/close calls, have been doing HEP no issue   PERTINENT HISTORY: 1.  History of left basal ganglia hemorrhage approximately 2 and half months ago.  He has completed inpatient rehabilitation as well as home health.  I do think he would be a great candidate for outpatient therapy.  He has gotten back to modified independent level but still has some mild weakness on the right side as well as fine motor issues and motor control issues.  His balance is also off.  He continues wear right AFO because of foot and ankle weakness.  Patient has some short-term memory issues as well Will make referral for outpatient PT OT and speech therapy Physical medicine rehab follow-up in 3 months  PAIN:  Are you having pain? No 0/10  PRECAUTIONS: Fall and Other: R hemi, has loop recorder   RED FLAGS: None   WEIGHT BEARING RESTRICTIONS: No  FALLS:  Has patient fallen in last 6 months? No  LIVING ENVIRONMENT: Lives with: lives alone Lives in:  House/apartment Stairs: 2 STE with rail  Has following equipment at home: Single point cane and Walker - 2 wheeled  OCCUPATION: retired- used to work for [employer]   PLOF: Independent, Independent with basic ADLs, Independent with gait, Independent with transfers, and Requires assistive device for independence  PATIENT GOALS: walk straight without a cane, get back to driving and motorcycling   NEXT MD VISIT: Referring 01/25/24  OBJECTIVE:  Note: Objective measures were completed at Evaluation unless otherwise noted.  DIAGNOSTIC FINDINGS:   CLINICAL DATA:  Stroke, follow up Neuro deficit, acute, stroke suspected   EXAM: MRI HEAD WITHOUT AND WITH CONTRAST   TECHNIQUE: Multiplanar, multiecho pulse sequences of the brain and surrounding structures were obtained without and with intravenous contrast.   CONTRAST:  6.71mL GADAVIST GADOBUTROL 1  MMOL/ML IV SOLN   COMPARISON:  CT head from today.   FINDINGS: Brain: When comparing across modalities, no substantial change in intraparenchymal hemorrhage in the left basal ganglia with intraventricular extension. No visible surrounding acute hemorrhage or mass lesion; however, acute blood products limits assessment. Prior left frontal infarct with encephalomalacia. No hydrocephalus. No pathologic enhancement.   Vascular: Major arterial flow voids are maintained at the skull base.   Skull and upper cervical spine: Normal marrow signal.   Sinuses/Orbits: Right frontal sinus opacification. Remaining sinuses are clear. No acute orbital findings.   Other: No mastoid effusions.   IMPRESSION: 1. When comparing across modalities, no substantial change in intraparenchymal hemorrhage in the left basal ganglia with intraventricular extension. No progressive mass effect. 2. No visible surrounding acute hemorrhage or mass lesion; however, acute blood products limits assessment  EXAM: CT HEAD WITHOUT CONTRAST   TECHNIQUE: Contiguous axial images were obtained from the base of the skull through the vertex without intravenous contrast.   RADIATION DOSE REDUCTION: This exam was performed according to the departmental dose-optimization program which includes automated exposure control, adjustment of the mA and/or kV according to patient size and/or use of iterative reconstruction technique.   COMPARISON:  Head CT 08/12/2023   FINDINGS: Brain: Interval decrease in size of the previously seen hemorrhage in the left basal ganglia, now measuring 10 x 7 mm, previously 20 x 12 mm. There is resolution of intraventricular blood products. No hydrocephalus. No extra-axial fluid collection. No mass effect. No mass lesion. No CT evidence of an acute cortical infarct. There is chronic left MCA territory infarct involving the left opercular region.   Vascular: No hyperdense vessel or  unexpected calcification.   Skull: Normal. Negative for fracture or focal lesion.   Sinuses/Orbits: No middle ear or mastoid effusion. Paranasal sinuses are notable for complete opacification of the right frontal sinus. Orbits are unremarkable.   Other: None.   IMPRESSION: No acute intracranial abnormality. Interval decrease in size of left basal ganglia hemorrhage, now measuring 10 x 7 mm, previously 20 x 12 mm. Resolution of intraventricular blood products. No hydrocephalus.    PATIENT SURVEYS:    Patient-Specific Activity Scoring Scheme  "0" represents "unable to perform." "10" represents "able to perform at prior level. 0 1 2 3 4 5 6 7 8 9  10 (Date and Score)   Activity Eval     1. Walking straight without the cane  4     2. Strength in RLE   4    3. Coordinated movements of R LE  3   4.    5.    Score 3.7    Total score = sum of the activity scores/number of  activities Minimum detectable change (90%CI) for average score = 2 points Minimum detectable change (90%CI) for single activity score = 3 points     COGNITION: Overall cognitive status: Within functional limits for tasks assessed     SENSATION: Not tested      LOWER EXTREMITY MMT:  MMT Right eval Left eval  Hip flexion 4 5  Hip extension    Hip abduction    Hip adduction    Hip internal rotation    Hip external rotation    Knee flexion 4 5  Knee extension 4 5  Ankle dorsiflexion 1 at best (in AFO, reports no movement in ankle)   Ankle plantarflexion    Ankle inversion    Ankle eversion     (Blank rows = not tested)    FUNCTIONAL TESTS:  5 times sit to stand: 12.8 seconds some off shift from R LE  Timed up and go (TUG): deferred  3 minute walk test: 270ft [facility]     10/31/23 0001  Standardized Balance Assessment  Standardized Balance Assessment Dynamic Gait Index  Dynamic Gait Index  Level Surface 2  Change in Gait Speed 1  Gait with Horizontal Head Turns 2  Gait with Vertical  Head Turns 2  Gait and Pivot Turn 1  Step Over Obstacle 1  Step Around Obstacles 1  Steps 0  Total Score 10    GAIT: Distance walked: 269ft  Assistive device utilized: Single point cane Level of assistance: Modified independence Comments: wide BOS, increased tone in R LE with gait pattern- limited knee ROM and ankle tends to invert a bit , mild unsteadiness with corners, limited rotation at trunk/hips                                                                                                                                 TREATMENT DATE:   11/15/23  Nustep L5x8 minutes all four extremities for w/u and neural priming  Forward step ups 4 inch step x15 B Lateral step ups 4 inch step x12 B U UE support on rail STS yellow TB goblet hold x15    Ipsilateral target taps on 4 inch step, cues for foot clearance and up to ModA for balance recovery  Cross midline target taps on 4 inch step, cues for foot clearance, intermittent MinA  Forward and lateral step overs half foam rolls in // bars x3 laps each    11/12/23 Leg press 20# 2x10  NuStep L5 x all extremities, x3 just LE's  Side steps in bars Balance on airex  -eyes open -eyes closed -feet together -cone taps  -3 way taps  Step overs 4" stairs  Seated box taps 5# forward and to the side 6"    11/09/23 Standing Heel/toe raise 2x10 Gastroc stretch x 30" Soleus stretch x 30" Staggered stance fwd/bwd weight shifts 2x10 R&L Staggered stance heel/toe x10 3 way clock reach, wash rag under foot  2x10 R&L Amb SPC with R AFO, working on heel/toe pattern and symmetrical step lengths multiple 2x3 laps around gym, clockwise and counter clockwise  11/05/23 NuStep L5x24mins, did just LE after 3 mins  Step ups 4" Leg ext 3# 2x10 HS curls red 2x10 Rocker board  Side steps on beam STS x10 then with OHP   10/31/23  Eval, POC, HEP- lots of education on stroke recovery and 6 month window for optimal regains of  strength/balance/function, encouraged him to keep doing HEP from HHPT as well as balance work he found for himself, has been able to get on/off and use his TM safely at home so encouraged this as well  Nustep L5 x5 minutes BLEs only      PATIENT EDUCATION:  Education details: exam findings, POC, HEP, otherwise as above  Person educated: Patient Education method: Programmer, multimedia, Demonstration, and Handouts Education comprehension: verbalized understanding, returned demonstration, and needs further education  HOME EXERCISE PROGRAM: Access Code: [email or web address] URL: [email or web address] Date: 11/09/2023 Prepared by: Vernon Prey April Kirstie Peri  Exercises - Heel Toe Raises with Counter Support  - 1 x daily - 7 x weekly - 2 sets - 10 reps - Stride Stance Weight Shift  - 1 x daily - 7 x weekly - 2 sets - 10 reps - Single Leg Balance with Clock Reach (with wash rag)  - 1 x daily - 7 x weekly - 2 sets - 10 reps  ASSESSMENT:  CLINICAL IMPRESSION:  Pt arrives today feeling well, we continued to work on a good mix of functional strength and balance as well as functional activity tolerance this session. Does have trouble with foot clearance tasks that force him to maintain stance phase on R LE, needed up to [facility] for balance today with challenges. Will continue to progress as able.    From eval: Patient is a 68 y.o. M who was seen today for physical therapy evaluation and treatment for I61.0 (ICD-10-CM) - Nontraumatic subcortical hemorrhage of left cerebral hemisphere [facility]). He is very motivated and moving a very high level given the hemorrhagic CVA. His main concerns are balance/coordination and getting back to walking without assistive device. Anticipate he will do very well with skilled PT services.   OBJECTIVE IMPAIRMENTS: Abnormal gait, decreased activity tolerance, decreased balance, decreased coordination, decreased knowledge of use of DME, decreased mobility, difficulty walking,  decreased strength, and decreased safety awareness.   ACTIVITY LIMITATIONS: standing, squatting, stairs, transfers, and locomotion level  PARTICIPATION LIMITATIONS: driving, shopping, community activity, and yard work  PERSONAL FACTORS: Age, Behavior pattern, Fitness, Past/current experiences, Social background, Time since onset of injury/illness/exacerbation, and Transportation are also affecting patient's functional outcome.   REHAB POTENTIAL: Good  CLINICAL DECISION MAKING: Evolving/moderate complexity  EVALUATION COMPLEXITY: Moderate   GOALS: Goals reviewed with patient? No  SHORT TERM GOALS: Target date: 11/28/2023   Will be compliant with appropriate progressive HEP  Baseline: Goal status: INITIAL  2.  Will score 14/24 on DGI  Baseline: 10 Goal status: INITIAL  3.  Will be able to perform all functional transfers without compensation strategies or offshift from hemi LE  Baseline:  Goal status: INITIAL  4.  Will be able to ambulate household distances without AD, Mod(I) Baseline:  Goal status: INITIAL    LONG TERM GOALS: Target date: 12/26/2023    MMT in R LE to be at least 4+/5 in hip and knee musculature  Baseline:  Goal status: INITIAL  2.  Will score 18/24 on DGI to show improved functional  balance  Baseline:  Goal status: INITIAL  3.  Will be able to walk at least 238ft over uneven/unsteady surfaces without device and no more than S/ 569ft over even surfaces no device Mod(I) Baseline:  Goal status: INITIAL  4.  Will be able to ascend and descend steps without rail and minimal compensation strategies  Baseline:  Goal status: INITIAL  5.  Will be compliant with advanced HEP vs gym based program at DC to maintain functional gains  Baseline:  Goal status: INITIAL  6.  PSFS to improve by at least 3 points  Baseline: 3.7 Goal status: INITIAL   PLAN:  PT FREQUENCY: 2x/week  PT DURATION: 8 weeks  PLANNED INTERVENTIONS: 97164- PT Re-evaluation,  97110-Therapeutic exercises, 97530- Therapeutic activity, 97112- Neuromuscular re-education, 97535- Self Care, 16109- Manual therapy, [id number]- Gait training, [phone]- Orthotic Fit/training, [phone]- Aquatic Therapy, Stair training, Taping, Dry Needling, Cryotherapy, and Moist heat  PLAN FOR NEXT SESSION: strength, balance, gait training, functional conditioning. Motor control tasks/progressions for R LE. Add to HEP next visit per pt request   Nedra Hai, PT, DPT 11/15/23 10:11 AM

## 2023-11-15 NOTE — Therapy (Signed)
 OUTPATIENT SPEECH LANGUAGE PATHOLOGY TREATMENT   Patient Name: Adrian [name] MRN: [id number] DOB:01-16-1956, 68 y.o., male Today's Date: 11/15/2023  PCP: NA REFERRING PROVIDER: Erick Colace, MD  END OF SESSION:  End of Session - 11/15/23 0848     Visit Number 3    Number of Visits 17    Date for SLP Re-Evaluation 01/07/24    SLP Start Time 0845    SLP Stop Time  0925    SLP Time Calculation (min) 40 min    Activity Tolerance Patient tolerated treatment well             Past Medical History:  Diagnosis Date   AF (paroxysmal atrial fibrillation) (HCC) 11/29/2018   Atrial flutter (HCC) 10/12/2015   a. TEE 3/17 with ? LAA clot-->s/p TEE/DCCV 11/24/2015; s/p DCCV 12/2022;s/p ablation 06/2023   CAD (coronary artery disease), native coronary artery 12/06/2015   a. 10/2015 MV: EF  37%, reversible defect inferior apex, intermediate risk findings; b. 10/2015 Cath: 20% mid RCA.   Carotid artery stenosis    1-39% right ICA stenosis and occluded left ICA   Colonic diverticular abscess    Diverticulitis    History of chemotherapy 2005   Cisplatin   Hypothyroidism    NICM (nonischemic cardiomyopathy) (HCC) 10/14/2015   a. Tachy mediated?;  b. Echo 3/17 - Mild concentric LVH, EF 30-35%, anteroseptal, anterior, anterolateral, apical anterior, lateral hypokinesis, trivial MR, mild to moderately reduced RVSF; c. LHC 3/17 - mRCA 20%   Radiation NOv.3,2005-Dec. 15, 2005   6810 cGy in 30 fractions   Tonsil cancer [facility]) 2005   Dr Otila Kluver.  XRT   Past Surgical History:  Procedure Laterality Date   A-FLUTTER ABLATION N/A 06/19/2023   Procedure: A-FLUTTER ABLATION;  Surgeon: Lanier Prude, MD;  Location: [facility] INVASIVE CV LAB;  Service: Cardiovascular;  Laterality: N/A;   APPENDECTOMY N/A 03/07/2019   Procedure: ROBOTIC ASSISTED APPENDECTOMY;  Surgeon: Karie Soda, MD;  Location: [facility] ORS;  Service: General;  Laterality: N/A;   CARDIAC CATHETERIZATION N/A 10/18/2015   Procedure:  Left Heart Cath and Coronary Angiography;  Surgeon: Kathleene Hazel, MD;  Location: [facility] INVASIVE CV LAB;  Service: Cardiovascular;  Laterality: N/A;   CARDIOVERSION N/A 11/24/2015   Procedure: CARDIOVERSION;  Surgeon: Wendall Stade, MD;  Location: [facility] ENDOSCOPY;  Service: Cardiovascular;  Laterality: N/A;   CARDIOVERSION N/A 12/20/2022   Procedure: CARDIOVERSION;  Surgeon: [name], MD;  Location: [facility] INVASIVE CV LAB;  Service: Cardiovascular;  Laterality: N/A;   CYSTOSCOPY WITH STENT PLACEMENT Bilateral 03/07/2019   Procedure: CYSTOSCOPY WITH BILATERAL FIREFLY INJECTION;  Surgeon: Crist Fat, MD;  Location: [facility] ORS;  Service: Urology;  Laterality: Bilateral;   GASTROSTOMY TUBE PLACEMENT  07/04/2004   IR - G tube for tonsilar cancer   IR ANGIO INTRA EXTRACRAN SEL COM CAROTID INNOMINATE UNI R MOD SED  03/09/2019   IR ANGIO VERTEBRAL SEL VERTEBRAL UNI R MOD SED  03/09/2019   IR CT HEAD LTD  03/09/2019   IR PERCUTANEOUS ART THROMBECTOMY/INFUSION INTRACRANIAL INC DIAG ANGIO  03/09/2019   IR RADIOLOGIST EVAL & MGMT  12/18/2018   LAMINECTOMY     C5/placement of steel plate   LOOP RECORDER INSERTION N/A 06/19/2023   Procedure: LOOP RECORDER INSERTION;  Surgeon: Lanier Prude, MD;  Location: [facility] INVASIVE CV LAB;  Service: Cardiovascular;  Laterality: N/A;   NECK SURGERY  2003   replaced disk   RADIOLOGY WITH ANESTHESIA N/A 03/08/2019   Procedure: IR  WITH ANESTHESIA;  Surgeon: Julieanne Cotton, MD;  Location: [facility] OR;  Service: Radiology;  Laterality: N/A;   TEE WITHOUT CARDIOVERSION N/A 10/15/2015   Procedure: TRANSESOPHAGEAL ECHOCARDIOGRAM (TEE);  Surgeon: Laurey Morale, MD;  Location: [facility] ENDOSCOPY;  Service: Cardiovascular;  Laterality: N/A;   TEE WITHOUT CARDIOVERSION N/A 11/24/2015   Procedure: TRANSESOPHAGEAL ECHOCARDIOGRAM (TEE);  Surgeon: Wendall Stade, MD;  Location: [facility] ENDOSCOPY;  Service: Cardiovascular;  Laterality: N/A;   TEE WITHOUT CARDIOVERSION N/A 12/20/2022   Procedure:  TRANSESOPHAGEAL ECHOCARDIOGRAM;  Surgeon: [name], MD;  Location: [facility] INVASIVE CV LAB;  Service: Cardiovascular;  Laterality: N/A;   XI ROBOTIC ASSISTED COLOSTOMY TAKEDOWN N/A 03/07/2019   Procedure: XI ROBOTIC ASSISTED LOW ANTERIOR RESECTION, RIGID PROCTOSCOPY;  Surgeon: Karie Soda, MD;  Location: [facility] ORS;  Service: General;  Laterality: N/A;   Patient Active Problem List   Diagnosis Date Noted   Carotid occlusion, left 10/23/2023   ICH (intracerebral hemorrhage) (HCC) 08/11/2023   Carotid artery stenosis    Atrial flutter (HCC) 12/19/2022   Essential hypertension 04/22/2021   Hypokalemia 03/12/2019   Expressive aphasia 03/10/2019   Dysphagia 03/10/2019   Middle cerebral artery embolism, left 03/09/2019   Diverticular stricture (HCC) 03/07/2019   Bacteria in urine    Chronic atrial fibrillation (HCC) 12/02/2018   Current use of long term anticoagulation 12/02/2018   UTI (urinary tract infection) 11/29/2018   Hyponatremia 11/29/2018   PAF (paroxysmal atrial fibrillation) (HCC) 11/29/2018   Diverticulitis of large intestine with abscess 09/23/2018   Dyslipidemia 12/05/2017   CAD (coronary artery disease), native coronary artery 12/06/2015   SOB (shortness of breath)    NICM (nonischemic cardiomyopathy) (HCC)    Thyroid activity decreased    Hypothyroidism 08/12/2013   History of radiation therapy 05/30/2012   Smokes tobacco daily 05/30/2012   Cancer of tonsillar fossa (HCC) 11/27/2011   Tonsil cancer (HCC) 2005   History of chemotherapy 2005    ONSET DATE: Referred on 10/26/23 (CVA in Jan)   REFERRING DIAG: I61.0 (ICD-10-CM) - Nontraumatic subcortical hemorrhage of left cerebral hemisphere (HCC)   THERAPY DIAG:  Cognitive communication deficit  Aphasia  Dysarthria and anarthria  Rationale for Evaluation and Treatment: Rehabilitation  SUBJECTIVE:   SUBJECTIVE STATEMENT: Pt had   Pt accompanied by: self  PERTINENT HISTORY: Per chart review: 68 y.o. male with  history of atrial fibrillation, on Eliquis, carotid artery stenosis, hypothyroidism , CVA 1/25  PAIN:  Are you having pain? No  FALLS: Has patient fallen in last 6 months?  No  LIVING ENVIRONMENT: Lives with: lives alone Lives in: House/apartment Ex Wife will be driving to appointments (or friends) Okey Regal  PLOF:  Level of assistance: Independent with ADLs, Independent with IADLs Employment: Retired; [employer]   PATIENT GOALS: memory, word finding, speech   OBJECTIVE:  Note: Objective measures were completed at Evaluation unless otherwise noted.  DIAGNOSTIC FINDINGS: Per Chart Review  CT HEAD CODE STROKE WO CONTRAST 08/11/23  IMPRESSION: 1.8 x 2.0 x 1.1 cm parenchymal hemorrhage centered in the left basal ganglia with intraventricular extension. No evidence of hydrocephalus at the time of exam. No significant midline shift.   Findings were paged to Dr. Derry Lory on 08/11/23 at 2:29 PM     Electronically Signed   By: Lorenza Cambridge M.D.   On: 08/11/2023 14:31  COGNITION: Overall cognitive status: Impaired Areas of impairment:  Attention: Impaired: Selective, Alternating, Divided Memory: Impaired: Short term Prospective Executive function: Impaired: Problem solving, Organization, Planning, and Slow processing Functional deficits:  Reports difficulty with recall of important information  AUDITORY COMPREHENSION: Overall auditory comprehension: Appears intact YES/NO questions: Appears intact Following directions: Appears intact Conversation: Complex Interfering components: attention and processing speed Effective technique: extra processing time, pausing, and repetition/stressing words  READING COMPREHENSION: Intact  EXPRESSION: verbal  VERBAL EXPRESSION: Level of generative/spontaneous verbalization: conversation  Comments: Anomia  Non-verbal means of communication: N/A  WRITTEN EXPRESSION: Dominant hand: right Written expression: Not tested  MOTOR  SPEECH: Overall motor speech: impaired Level of impairment: Conversation Respiration:  WFL Phonation: normal Resonance: WFL Articulation: Appears intact; pt reports slurred speech when feeling tired Intelligibility: Intelligible; 100% intelligible at eval; reports worsening speech when fatigued Motor planning: Appears intact Motor speech errors:  NA Interfering components:  NA Effective technique: slow rate and over articulate  ORAL MOTOR EXAMINATION: Overall status: Impaired:   Labial: Right (Symmetry) Facial: Right (Symmetry) *ROM is good. At rest, facial droop on R side. Comments: NA   STANDARDIZED ASSESSMENTS: Initiated CLQT - to complete next session    Cognitive Linguistic Quick Test: AGE - 18 - 69   The Cognitive Linguistic Quick Test (CLQT) was administered to assess the relative status of five cognitive domains: attention, memory, language, executive functioning, and visuospatial skills. Scores from 10 tasks were used to estimate severity ratings (standardized for age groups 18-69 years and 70-89 years) for each domain, a clock drawing task, as well as an overall composite severity rating of cognition.       Task Score Criterion Cut Scores  Personal Facts 7/8 8  Symbol Cancellation 11/12 11  Confrontation Naming 10/10 10  Clock Drawing  11/13 12  Story Retelling 6/10 6  Symbol Trails 10/10 9  Generative Naming 3/9 5  Design Memory 6/6 5  Mazes  8/8 7  Design Generation 5/13 6     Cognitive Domain Composite Score Severity Rating  Attention 190/215 WNL  Memory 148/185 Mild  Executive Function 26/40 WNL  Language 26/37 Mild  Visuospatial Skills 95/105 WNL  Clock Drawing  11/13 Mild  Composite Severity Rating  WNL           PATIENT REPORTED OUTCOME MEASURES (PROM): To complete next session                                                                                                                            TREATMENT DATE:   11/15/23: Pt was  seen for skilled ST services targeting cognitive-communication and word finding strategies. SLP initiated education on word finding. SLP provided examples of each strategies and how to use in conversation. Pt reported he feels he would like to the following word finding strategies re: delay, describe, association, look it up. Pt was noted to have errors when reading aloud which was impacting his reading comprehension. Pt was noted to delete sounds from words  and add functor words while reading. SLP trialed re: pointing to each word with pen, re-reading aloud, re-reading silently to remove expressive component. To  demonstrate "describe" strategy through Semantic Feature Analysis (SFA) next session.   11/09/23: Pt was seen for skilled ST services targeting completed cognitive-communication testing and education. SLP reviewed CLQT results with patient. He had no further questions. SLP provided pt with cognitive-communication handout. Pt reported difficulty with re: recalling details in conversations, understanding TV shows, difficulty with expressing thoughts, and reading/writing.  Pt has residual aphasia from previous stroke. To provide strategies in this POC.   11/07/23: Provided brief education on cognitive-communication disorder and speech intelligibility.   PATIENT EDUCATION: Education details: SLP role in cognitive-communication impairments Person educated: Patient Education method: Explanation Education comprehension: needs further education   GOALS: Goals reviewed with patient? No; to update after further evaluation  SHORT TERM GOALS: Target date: 12/08/23  Continue Cognitive testing to determine needs Baseline: Goal status: MET  2.  Complete PROMs measure Baseline:  Goal status: INITIAL  3.  Pt will recall 3 word finding strategies to use in instances of anomia with independently. Baseline:  Goal status: INITIAL  4.  Pt will verbalize 2 attention strategies to improve concentration  during functional tasks.  Baseline:  Goal status: INITIAL  5. Pt will verbalize 2 memory strategies for recall of important information.  Baseline:  Goal status: INITIAL    LONG TERM GOALS: Target date: 01/08/24  Pt will improve score on PROMs measure Baseline:  Goal status: INITIAL  2.  Pt will report successful use of word finding strategies at home and in the community. Baseline:  Goal status: INITIAL  3.  Pt will report successful use of attention strategies at home and in the community. Baseline:  Goal status: INITIAL  4.  Pt will report successful use of memory strategies at home and in the community.  Baseline:  Goal status: INITIAL   ASSESSMENT:  CLINICAL IMPRESSION: Pt is a 68 yo male who presents to ST OP for evaluation post recent CVA in Jan. Pt lives alone and will be driven to appointments by ex-wife or friends. Pt endorses speech changes, in addition to sx associated with cognitive-communication challenges [id number] of new information, word finding, and slowed processing.  Pt was assessed using CLQT - to complete next session. SLP observed difficulty with short term memory, instances of anomia, and very mild dysarthria. Pt was 100% intelligible in quiet, therapy room to an unfamiliar listener. He reports speech is worse when feeling tired. SLP to provide strategies for pt use in moments where speech may be unclear. Pt reports no difficulty with swallowing at this time and is tolerating a regular/thin diet. SLP rec skilled ST services to address cognitive-communication impairment and dysarthria to maximize functional independence and increase QOL.     OBJECTIVE IMPAIRMENTS: include memory, executive functioning, expressive language, and dysarthria. These impairments are limiting patient from effectively communicating at home and in community. Factors affecting potential to achieve goals and functional outcome are  NA . Patient will benefit from skilled SLP services to  address above impairments and improve overall function.  REHAB POTENTIAL: Good  PLAN:  SLP FREQUENCY: 1-2x/week  SLP DURATION: 8 weeks  PLANNED INTERVENTIONS: Environmental controls, Cueing hierachy, Internal/external aids, Functional tasks, SLP instruction and feedback, Compensatory strategies, Patient/family education, and [id number] Treatment of speech (30 or 45 min)     [employer], CCC-SLP 11/15/2023, 8:49 AM

## 2023-11-17 ENCOUNTER — Encounter: Payer: Self-pay | Admitting: Cardiology

## 2023-11-20 ENCOUNTER — Encounter: Payer: Self-pay | Admitting: Physical Therapy

## 2023-11-20 ENCOUNTER — Ambulatory Visit: Admitting: Speech Pathology

## 2023-11-20 ENCOUNTER — Ambulatory Visit: Admitting: Occupational Therapy

## 2023-11-20 ENCOUNTER — Encounter: Payer: Self-pay | Admitting: Speech Pathology

## 2023-11-20 ENCOUNTER — Ambulatory Visit: Admitting: Physical Therapy

## 2023-11-20 DIAGNOSIS — R471 Dysarthria and anarthria: Secondary | ICD-10-CM

## 2023-11-20 DIAGNOSIS — R41841 Cognitive communication deficit: Secondary | ICD-10-CM

## 2023-11-20 DIAGNOSIS — R41840 Attention and concentration deficit: Secondary | ICD-10-CM

## 2023-11-20 DIAGNOSIS — R2689 Other abnormalities of gait and mobility: Secondary | ICD-10-CM

## 2023-11-20 DIAGNOSIS — M6281 Muscle weakness (generalized): Secondary | ICD-10-CM

## 2023-11-20 DIAGNOSIS — R41844 Frontal lobe and executive function deficit: Secondary | ICD-10-CM

## 2023-11-20 DIAGNOSIS — R2681 Unsteadiness on feet: Secondary | ICD-10-CM

## 2023-11-20 DIAGNOSIS — R278 Other lack of coordination: Secondary | ICD-10-CM

## 2023-11-20 NOTE — Therapy (Unsigned)
 OUTPATIENT SPEECH LANGUAGE PATHOLOGY TREATMENT   Patient Name: Adrian [name] MRN: [id number] DOB:09-Apr-1956, 68 y.o., male Today's Date: 11/20/2023  PCP: NA REFERRING PROVIDER: Genetta Kenning, MD  END OF SESSION:  End of Session - 11/20/23 1013     Visit Number 4    Number of Visits 17    Date for SLP Re-Evaluation 01/07/24    SLP Start Time 1013    SLP Stop Time  1053    SLP Time Calculation (min) 40 min    Activity Tolerance Patient tolerated treatment well             Past Medical History:  Diagnosis Date   AF (paroxysmal atrial fibrillation) (HCC) 11/29/2018   Atrial flutter (HCC) 10/12/2015   a. TEE 3/17 with ? LAA clot-->s/p TEE/DCCV 11/24/2015; s/p DCCV 12/2022;s/p ablation 06/2023   CAD (coronary artery disease), native coronary artery 12/06/2015   a. 10/2015 MV: EF  37%, reversible defect inferior apex, intermediate risk findings; b. 10/2015 Cath: 20% mid RCA.   Carotid artery stenosis    1-39% right ICA stenosis and occluded left ICA   Colonic diverticular abscess    Diverticulitis    History of chemotherapy 2005   Cisplatin   Hypothyroidism    NICM (nonischemic cardiomyopathy) (HCC) 10/14/2015   a. Tachy mediated?;  b. Echo 3/17 - Mild concentric LVH, EF 30-35%, anteroseptal, anterior, anterolateral, apical anterior, lateral hypokinesis, trivial MR, mild to moderately reduced RVSF; c. LHC 3/17 - mRCA 20%   Radiation NOv.3,2005-Dec. 15, 2005   6810 cGy in 30 fractions   Tonsil cancer [facility]) 2005   Dr Ava Lei.  XRT   Past Surgical History:  Procedure Laterality Date   A-FLUTTER ABLATION N/A 06/19/2023   Procedure: A-FLUTTER ABLATION;  Surgeon: Boyce Byes, MD;  Location: [facility] INVASIVE CV LAB;  Service: Cardiovascular;  Laterality: N/A;   APPENDECTOMY N/A 03/07/2019   Procedure: ROBOTIC ASSISTED APPENDECTOMY;  Surgeon: Candyce Champagne, MD;  Location: [facility] ORS;  Service: General;  Laterality: N/A;   CARDIAC CATHETERIZATION N/A 10/18/2015   Procedure:  Left Heart Cath and Coronary Angiography;  Surgeon: Odie Benne, MD;  Location: [facility] INVASIVE CV LAB;  Service: Cardiovascular;  Laterality: N/A;   CARDIOVERSION N/A 11/24/2015   Procedure: CARDIOVERSION;  Surgeon: Loyde Rule, MD;  Location: [facility] ENDOSCOPY;  Service: Cardiovascular;  Laterality: N/A;   CARDIOVERSION N/A 12/20/2022   Procedure: CARDIOVERSION;  Surgeon: Bridgette Campus, MD;  Location: [facility] INVASIVE CV LAB;  Service: Cardiovascular;  Laterality: N/A;   CYSTOSCOPY WITH STENT PLACEMENT Bilateral 03/07/2019   Procedure: CYSTOSCOPY WITH BILATERAL FIREFLY INJECTION;  Surgeon: Andrez Banker, MD;  Location: [facility] ORS;  Service: Urology;  Laterality: Bilateral;   GASTROSTOMY TUBE PLACEMENT  07/04/2004   IR - G tube for tonsilar cancer   IR ANGIO INTRA EXTRACRAN SEL COM CAROTID INNOMINATE UNI R MOD SED  03/09/2019   IR ANGIO VERTEBRAL SEL VERTEBRAL UNI R MOD SED  03/09/2019   IR CT HEAD LTD  03/09/2019   IR PERCUTANEOUS ART THROMBECTOMY/INFUSION INTRACRANIAL INC DIAG ANGIO  03/09/2019   IR RADIOLOGIST EVAL & MGMT  12/18/2018   LAMINECTOMY     C5/placement of steel plate   LOOP RECORDER INSERTION N/A 06/19/2023   Procedure: LOOP RECORDER INSERTION;  Surgeon: Boyce Byes, MD;  Location: [facility] INVASIVE CV LAB;  Service: Cardiovascular;  Laterality: N/A;   NECK SURGERY  2003   replaced disk   RADIOLOGY WITH ANESTHESIA N/A 03/08/2019   Procedure: IR  WITH ANESTHESIA;  Surgeon: Luellen Sages, MD;  Location: [facility] OR;  Service: Radiology;  Laterality: N/A;   TEE WITHOUT CARDIOVERSION N/A 10/15/2015   Procedure: TRANSESOPHAGEAL ECHOCARDIOGRAM (TEE);  Surgeon: Darlis Eisenmenger, MD;  Location: [facility] ENDOSCOPY;  Service: Cardiovascular;  Laterality: N/A;   TEE WITHOUT CARDIOVERSION N/A 11/24/2015   Procedure: TRANSESOPHAGEAL ECHOCARDIOGRAM (TEE);  Surgeon: Loyde Rule, MD;  Location: [facility] ENDOSCOPY;  Service: Cardiovascular;  Laterality: N/A;   TEE WITHOUT CARDIOVERSION N/A 12/20/2022   Procedure:  TRANSESOPHAGEAL ECHOCARDIOGRAM;  Surgeon: Bridgette Campus, MD;  Location: [facility] INVASIVE CV LAB;  Service: Cardiovascular;  Laterality: N/A;   XI ROBOTIC ASSISTED COLOSTOMY TAKEDOWN N/A 03/07/2019   Procedure: XI ROBOTIC ASSISTED LOW ANTERIOR RESECTION, RIGID PROCTOSCOPY;  Surgeon: Candyce Champagne, MD;  Location: [facility] ORS;  Service: General;  Laterality: N/A;   Patient Active Problem List   Diagnosis Date Noted   Carotid occlusion, left 10/23/2023   ICH (intracerebral hemorrhage) (HCC) 08/11/2023   Carotid artery stenosis    Atrial flutter (HCC) 12/19/2022   Essential hypertension 04/22/2021   Hypokalemia 03/12/2019   Expressive aphasia 03/10/2019   Dysphagia 03/10/2019   Middle cerebral artery embolism, left 03/09/2019   Diverticular stricture (HCC) 03/07/2019   Bacteria in urine    Chronic atrial fibrillation (HCC) 12/02/2018   Current use of long term anticoagulation 12/02/2018   UTI (urinary tract infection) 11/29/2018   Hyponatremia 11/29/2018   PAF (paroxysmal atrial fibrillation) (HCC) 11/29/2018   Diverticulitis of large intestine with abscess 09/23/2018   Dyslipidemia 12/05/2017   CAD (coronary artery disease), native coronary artery 12/06/2015   SOB (shortness of breath)    NICM (nonischemic cardiomyopathy) (HCC)    Thyroid activity decreased    Hypothyroidism 08/12/2013   History of radiation therapy 05/30/2012   Smokes tobacco daily 05/30/2012   Cancer of tonsillar fossa (HCC) 11/27/2011   Tonsil cancer (HCC) 2005   History of chemotherapy 2005    ONSET DATE: Referred on 10/26/23 (CVA in Jan)   REFERRING DIAG: I61.0 (ICD-10-CM) - Nontraumatic subcortical hemorrhage of left cerebral hemisphere [facility])   THERAPY DIAG:  Cognitive communication deficit  Dysarthria and anarthria  Rationale for Evaluation and Treatment: Rehabilitation  SUBJECTIVE:   SUBJECTIVE STATEMENT: Pt reports embarrassment with communication.   Pt accompanied by: self  PERTINENT HISTORY: Per chart  review: 68 y.o. male with history of atrial fibrillation, on Eliquis, carotid artery stenosis, hypothyroidism , CVA 1/25  PAIN:  Are you having pain? No  FALLS: Has patient fallen in last 6 months?  No  LIVING ENVIRONMENT: Lives with: lives alone Lives in: House/apartment Ex Wife will be driving to appointments (or friends) Sheryle Donning  PLOF:  Level of assistance: Independent with ADLs, Independent with IADLs Employment: Retired; [employer]   PATIENT GOALS: memory, word finding, speech   OBJECTIVE:  Note: Objective measures were completed at Evaluation unless otherwise noted.  DIAGNOSTIC FINDINGS: Per Chart Review  CT HEAD CODE STROKE WO CONTRAST 08/11/23  IMPRESSION: 1.8 x 2.0 x 1.1 cm parenchymal hemorrhage centered in the left basal ganglia with intraventricular extension. No evidence of hydrocephalus at the time of exam. No significant midline shift.   Findings were paged to Dr. Murvin Arthurs on 08/11/23 at 2:29 PM     Electronically Signed   By: Clora Dane M.D.   On: 08/11/2023 14:31  COGNITION: Overall cognitive status: Impaired Areas of impairment:  Attention: Impaired: Selective, Alternating, Divided Memory: Impaired: Short term Prospective Executive function: Impaired: Problem solving, Organization, Planning, and Slow processing Functional  deficits: Reports difficulty with recall of important information  AUDITORY COMPREHENSION: Overall auditory comprehension: Appears intact YES/NO questions: Appears intact Following directions: Appears intact Conversation: Complex Interfering components: attention and processing speed Effective technique: extra processing time, pausing, and repetition/stressing words  READING COMPREHENSION: Intact  EXPRESSION: verbal  VERBAL EXPRESSION: Level of generative/spontaneous verbalization: conversation  Comments: Anomia  Non-verbal means of communication: N/A  WRITTEN EXPRESSION: Dominant hand: right Written expression: Not  tested  MOTOR SPEECH: Overall motor speech: impaired Level of impairment: Conversation Respiration:  WFL Phonation: normal Resonance: WFL Articulation: Appears intact; pt reports slurred speech when feeling tired Intelligibility: Intelligible; 100% intelligible at eval; reports worsening speech when fatigued Motor planning: Appears intact Motor speech errors:  NA Interfering components:  NA Effective technique: slow rate and over articulate  ORAL MOTOR EXAMINATION: Overall status: Impaired:   Labial: Right (Symmetry) Facial: Right (Symmetry) *ROM is good. At rest, facial droop on R side. Comments: NA   STANDARDIZED ASSESSMENTS: Initiated CLQT - to complete next session    Cognitive Linguistic Quick Test: AGE - 18 - 69   The Cognitive Linguistic Quick Test (CLQT) was administered to assess the relative status of five cognitive domains: attention, memory, language, executive functioning, and visuospatial skills. Scores from 10 tasks were used to estimate severity ratings (standardized for age groups 18-69 years and 70-89 years) for each domain, a clock drawing task, as well as an overall composite severity rating of cognition.       Task Score Criterion Cut Scores  Personal Facts 7/8 8  Symbol Cancellation 11/12 11  Confrontation Naming 10/10 10  Clock Drawing  11/13 12  Story Retelling 6/10 6  Symbol Trails 10/10 9  Generative Naming 3/9 5  Design Memory 6/6 5  Mazes  8/8 7  Design Generation 5/13 6     Cognitive Domain Composite Score Severity Rating  Attention 190/215 WNL  Memory 148/185 Mild  Executive Function 26/40 WNL  Language 26/37 Mild  Visuospatial Skills 95/105 WNL  Clock Drawing  11/13 Mild  Composite Severity Rating  WNL     PATIENT REPORTED OUTCOME MEASURES (PROM): Neuro QOL - Cognitive Short Form: 29                                                                                                                             TREATMENT DATE:    11/20/23: Pt was seen for skilled ST services targeting cognitive-communication and word finding strategies. Pt completed Cognitive PROMs measure - see above in blue. Pt did not remember to bring folder to today's session - SLP printed additional handout.  Pt reports he hasn't had any significant instances of anomia - given a 2-3 second delay, word typically pops out.  SLP facilitated use of word finding strategy - describe using SFA. Pt required min-modA to organize thoughts and retrieve words. After generating answers to SFA questions, pt was able to identify the most SALIENT feature. He was able to identify  the strategy that would relay his thought the quickest independently. To cont with word finding strategies.   11/15/23: Pt was seen for skilled ST services targeting cognitive-communication and word finding strategies. SLP initiated education on word finding. SLP provided examples of each strategies and how to use in conversation. Pt reported he feels he would like to the following word finding strategies re: delay, describe, association, look it up. Pt was noted to have errors when reading aloud which was impacting his reading comprehension. Pt was noted to delete sounds from words  and add functor words while reading. SLP trialed re: pointing to each word with pen, re-reading aloud, re-reading silently to remove expressive component. To demonstrate "describe" strategy through Semantic Feature Analysis (SFA) next session.   11/09/23: Pt was seen for skilled ST services targeting completed cognitive-communication testing and education. SLP reviewed CLQT results with patient. He had no further questions. SLP provided pt with cognitive-communication handout. Pt reported difficulty with re: recalling details in conversations, understanding TV shows, difficulty with expressing thoughts, and reading/writing.  Pt has residual aphasia from previous stroke. To provide strategies in this POC.   11/07/23: Provided  brief education on cognitive-communication disorder and speech intelligibility.   PATIENT EDUCATION: Education details: SLP role in cognitive-communication impairments Person educated: Patient Education method: Explanation Education comprehension: needs further education   GOALS: Goals reviewed with patient? No; to update after further evaluation  SHORT TERM GOALS: Target date: 12/08/23  Continue Cognitive testing to determine needs Baseline: Goal status: MET  2.  Complete PROMs measure Baseline:  Goal status: INITIAL  3.  Pt will recall 3 word finding strategies to use in instances of anomia with independently. Baseline:  Goal status: INITIAL  4.  Pt will verbalize 2 attention strategies to improve concentration during functional tasks.  Baseline:  Goal status: INITIAL  5. Pt will verbalize 2 memory strategies for recall of important information.  Baseline:  Goal status: INITIAL    LONG TERM GOALS: Target date: 01/08/24  Pt will improve score on PROMs measure Baseline:  Goal status: INITIAL  2.  Pt will report successful use of word finding strategies at home and in the community. Baseline:  Goal status: INITIAL  3.  Pt will report successful use of attention strategies at home and in the community. Baseline:  Goal status: INITIAL  4.  Pt will report successful use of memory strategies at home and in the community.  Baseline:  Goal status: INITIAL   ASSESSMENT:  CLINICAL IMPRESSION: Pt is a 68 yo male who presents to ST OP for evaluation post recent CVA in Jan. Pt lives alone and will be driven to appointments by ex-wife or friends. Pt endorses speech changes, in addition to sx associated with cognitive-communication challenges ON:GEXBMW of new information, word finding, and slowed processing.  Pt was assessed using CLQT - to complete next session. SLP observed difficulty with short term memory, instances of anomia, and very mild dysarthria. Pt was 100%  intelligible in quiet, therapy room to an unfamiliar listener. He reports speech is worse when feeling tired. SLP to provide strategies for pt use in moments where speech may be unclear. Pt reports no difficulty with swallowing at this time and is tolerating a regular/thin diet. SLP rec skilled ST services to address cognitive-communication impairment and dysarthria to maximize functional independence and increase QOL.     OBJECTIVE IMPAIRMENTS: include memory, executive functioning, expressive language, and dysarthria. These impairments are limiting patient from effectively communicating at home and in  community. Factors affecting potential to achieve goals and functional outcome are  NA . Patient will benefit from skilled SLP services to address above impairments and improve overall function.  REHAB POTENTIAL: Good  PLAN:  SLP FREQUENCY: 1-2x/week  SLP DURATION: 8 weeks  PLANNED INTERVENTIONS: Environmental controls, Cueing hierachy, Internal/external aids, Functional tasks, SLP instruction and feedback, Compensatory strategies, Patient/family education, and [id number] Treatment of speech (30 or 45 min)     [employer], CCC-SLP 11/20/2023, 10:14 AM

## 2023-11-20 NOTE — Therapy (Signed)
 OUTPATIENT PHYSICAL THERAPY TREATMENT   Patient Name: Adrian [name] MRN: [id number] DOB:April 20, 1956, 68 y.o., male Today's Date: 11/20/2023  END OF SESSION:  PT End of Session - 11/20/23 1126     Visit Number 6    Number of Visits 17    Date for PT Re-Evaluation 12/26/23    Authorization Type MCR    Authorization Time Period 10/31/23 to 12/26/23    Progress Note Due on Visit 10    PT Start Time 1101    PT Stop Time 1141    PT Time Calculation (min) 40 min    Activity Tolerance Patient tolerated treatment well    Behavior During Therapy [facility] for tasks assessed/performed                  Past Medical History:  Diagnosis Date   AF (paroxysmal atrial fibrillation) (HCC) 11/29/2018   Atrial flutter (HCC) 10/12/2015   a. TEE 3/17 with ? LAA clot-->s/p TEE/DCCV 11/24/2015; s/p DCCV 12/2022;s/p ablation 06/2023   CAD (coronary artery disease), native coronary artery 12/06/2015   a. 10/2015 MV: EF  37%, reversible defect inferior apex, intermediate risk findings; b. 10/2015 Cath: 20% mid RCA.   Carotid artery stenosis    1-39% right ICA stenosis and occluded left ICA   Colonic diverticular abscess    Diverticulitis    History of chemotherapy 2005   Cisplatin   Hypothyroidism    NICM (nonischemic cardiomyopathy) (HCC) 10/14/2015   a. Tachy mediated?;  b. Echo 3/17 - Mild concentric LVH, EF 30-35%, anteroseptal, anterior, anterolateral, apical anterior, lateral hypokinesis, trivial MR, mild to moderately reduced RVSF; c. LHC 3/17 - mRCA 20%   Radiation NOv.3,2005-Dec. 15, 2005   6810 cGy in 30 fractions   Tonsil cancer [facility]) 2005   Dr Ava Lei.  XRT   Past Surgical History:  Procedure Laterality Date   A-FLUTTER ABLATION N/A 06/19/2023   Procedure: A-FLUTTER ABLATION;  Surgeon: Boyce Byes, MD;  Location: [facility] INVASIVE CV LAB;  Service: Cardiovascular;  Laterality: N/A;   APPENDECTOMY N/A 03/07/2019   Procedure: ROBOTIC ASSISTED APPENDECTOMY;  Surgeon: Candyce Champagne,  MD;  Location: [facility] ORS;  Service: General;  Laterality: N/A;   CARDIAC CATHETERIZATION N/A 10/18/2015   Procedure: Left Heart Cath and Coronary Angiography;  Surgeon: Odie Benne, MD;  Location: [facility] INVASIVE CV LAB;  Service: Cardiovascular;  Laterality: N/A;   CARDIOVERSION N/A 11/24/2015   Procedure: CARDIOVERSION;  Surgeon: Loyde Rule, MD;  Location: [facility] ENDOSCOPY;  Service: Cardiovascular;  Laterality: N/A;   CARDIOVERSION N/A 12/20/2022   Procedure: CARDIOVERSION;  Surgeon: Bridgette Campus, MD;  Location: [facility] INVASIVE CV LAB;  Service: Cardiovascular;  Laterality: N/A;   CYSTOSCOPY WITH STENT PLACEMENT Bilateral 03/07/2019   Procedure: CYSTOSCOPY WITH BILATERAL FIREFLY INJECTION;  Surgeon: Andrez Banker, MD;  Location: [facility] ORS;  Service: Urology;  Laterality: Bilateral;   GASTROSTOMY TUBE PLACEMENT  07/04/2004   IR - G tube for tonsilar cancer   IR ANGIO INTRA EXTRACRAN SEL COM CAROTID INNOMINATE UNI R MOD SED  03/09/2019   IR ANGIO VERTEBRAL SEL VERTEBRAL UNI R MOD SED  03/09/2019   IR CT HEAD LTD  03/09/2019   IR PERCUTANEOUS ART THROMBECTOMY/INFUSION INTRACRANIAL INC DIAG ANGIO  03/09/2019   IR RADIOLOGIST EVAL & MGMT  12/18/2018   LAMINECTOMY     C5/placement of steel plate   LOOP RECORDER INSERTION N/A 06/19/2023   Procedure: LOOP RECORDER INSERTION;  Surgeon: Boyce Byes, MD;  Location: [facility] INVASIVE  CV LAB;  Service: Cardiovascular;  Laterality: N/A;   NECK SURGERY  2003   replaced disk   RADIOLOGY WITH ANESTHESIA N/A 03/08/2019   Procedure: IR WITH ANESTHESIA;  Surgeon: Luellen Sages, MD;  Location: [facility] OR;  Service: Radiology;  Laterality: N/A;   TEE WITHOUT CARDIOVERSION N/A 10/15/2015   Procedure: TRANSESOPHAGEAL ECHOCARDIOGRAM (TEE);  Surgeon: Darlis Eisenmenger, MD;  Location: [facility] ENDOSCOPY;  Service: Cardiovascular;  Laterality: N/A;   TEE WITHOUT CARDIOVERSION N/A 11/24/2015   Procedure: TRANSESOPHAGEAL ECHOCARDIOGRAM (TEE);  Surgeon: Loyde Rule, MD;  Location:  [facility] ENDOSCOPY;  Service: Cardiovascular;  Laterality: N/A;   TEE WITHOUT CARDIOVERSION N/A 12/20/2022   Procedure: TRANSESOPHAGEAL ECHOCARDIOGRAM;  Surgeon: Bridgette Campus, MD;  Location: [facility] INVASIVE CV LAB;  Service: Cardiovascular;  Laterality: N/A;   XI ROBOTIC ASSISTED COLOSTOMY TAKEDOWN N/A 03/07/2019   Procedure: XI ROBOTIC ASSISTED LOW ANTERIOR RESECTION, RIGID PROCTOSCOPY;  Surgeon: Candyce Champagne, MD;  Location: [facility] ORS;  Service: General;  Laterality: N/A;   Patient Active Problem List   Diagnosis Date Noted   Carotid occlusion, left 10/23/2023   ICH (intracerebral hemorrhage) (HCC) 08/11/2023   Carotid artery stenosis    Atrial flutter (HCC) 12/19/2022   Essential hypertension 04/22/2021   Hypokalemia 03/12/2019   Expressive aphasia 03/10/2019   Dysphagia 03/10/2019   Middle cerebral artery embolism, left 03/09/2019   Diverticular stricture (HCC) 03/07/2019   Bacteria in urine    Chronic atrial fibrillation (HCC) 12/02/2018   Current use of long term anticoagulation 12/02/2018   UTI (urinary tract infection) 11/29/2018   Hyponatremia 11/29/2018   PAF (paroxysmal atrial fibrillation) (HCC) 11/29/2018   Diverticulitis of large intestine with abscess 09/23/2018   Dyslipidemia 12/05/2017   CAD (coronary artery disease), native coronary artery 12/06/2015   SOB (shortness of breath)    NICM (nonischemic cardiomyopathy) (HCC)    Thyroid activity decreased    Hypothyroidism 08/12/2013   History of radiation therapy 05/30/2012   Smokes tobacco daily 05/30/2012   Cancer of tonsillar fossa (HCC) 11/27/2011   Tonsil cancer (HCC) 2005   History of chemotherapy 2005    PCP: Glean Lamy MD   REFERRING PROVIDER: Genetta Kenning, MD  REFERRING DIAG: I61.0 (ICD-10-CM) - Nontraumatic subcortical hemorrhage of left cerebral hemisphere [facility])  THERAPY DIAG:  Muscle weakness (generalized)  Unsteadiness on feet  Other lack of coordination  Other abnormalities of gait and  mobility  Rationale for Evaluation and Treatment: Rehabilitation  ONSET DATE: 08/11/23  SUBJECTIVE:   SUBJECTIVE STATEMENT:   Nothing really new, felt good after last visit  PERTINENT HISTORY: 1.  History of left basal ganglia hemorrhage approximately 2 and half months ago.  He has completed inpatient rehabilitation as well as home health.  I do think he would be a great candidate for outpatient therapy.  He has gotten back to modified independent level but still has some mild weakness on the right side as well as fine motor issues and motor control issues.  His balance is also off.  He continues wear right AFO because of foot and ankle weakness.  Patient has some short-term memory issues as well Will make referral for outpatient PT OT and speech therapy Physical medicine rehab follow-up in 3 months  PAIN:  Are you having pain? No   PRECAUTIONS: Fall and Other: R hemi, has loop recorder   RED FLAGS: None   WEIGHT BEARING RESTRICTIONS: No  FALLS:  Has patient fallen in last 6 months? No  LIVING ENVIRONMENT: Lives with: lives  alone Lives in: House/apartment Stairs: 2 STE with rail  Has following equipment at home: Single point cane and Walker - 2 wheeled  OCCUPATION: retired- used to work for [employer]   PLOF: Independent, Independent with basic ADLs, Independent with gait, Independent with transfers, and Requires assistive device for independence  PATIENT GOALS: walk straight without a cane, get back to driving and motorcycling   NEXT MD VISIT: Referring 01/25/24  OBJECTIVE:  Note: Objective measures were completed at Evaluation unless otherwise noted.  DIAGNOSTIC FINDINGS:   CLINICAL DATA:  Stroke, follow up Neuro deficit, acute, stroke suspected   EXAM: MRI HEAD WITHOUT AND WITH CONTRAST   TECHNIQUE: Multiplanar, multiecho pulse sequences of the brain and surrounding structures were obtained without and with intravenous contrast.   CONTRAST:  6.61mL GADAVIST  GADOBUTROL 1 MMOL/ML IV SOLN   COMPARISON:  CT head from today.   FINDINGS: Brain: When comparing across modalities, no substantial change in intraparenchymal hemorrhage in the left basal ganglia with intraventricular extension. No visible surrounding acute hemorrhage or mass lesion; however, acute blood products limits assessment. Prior left frontal infarct with encephalomalacia. No hydrocephalus. No pathologic enhancement.   Vascular: Major arterial flow voids are maintained at the skull base.   Skull and upper cervical spine: Normal marrow signal.   Sinuses/Orbits: Right frontal sinus opacification. Remaining sinuses are clear. No acute orbital findings.   Other: No mastoid effusions.   IMPRESSION: 1. When comparing across modalities, no substantial change in intraparenchymal hemorrhage in the left basal ganglia with intraventricular extension. No progressive mass effect. 2. No visible surrounding acute hemorrhage or mass lesion; however, acute blood products limits assessment  EXAM: CT HEAD WITHOUT CONTRAST   TECHNIQUE: Contiguous axial images were obtained from the base of the skull through the vertex without intravenous contrast.   RADIATION DOSE REDUCTION: This exam was performed according to the departmental dose-optimization program which includes automated exposure control, adjustment of the mA and/or kV according to patient size and/or use of iterative reconstruction technique.   COMPARISON:  Head CT 08/12/2023   FINDINGS: Brain: Interval decrease in size of the previously seen hemorrhage in the left basal ganglia, now measuring 10 x 7 mm, previously 20 x 12 mm. There is resolution of intraventricular blood products. No hydrocephalus. No extra-axial fluid collection. No mass effect. No mass lesion. No CT evidence of an acute cortical infarct. There is chronic left MCA territory infarct involving the left opercular region.   Vascular: No hyperdense vessel  or unexpected calcification.   Skull: Normal. Negative for fracture or focal lesion.   Sinuses/Orbits: No middle ear or mastoid effusion. Paranasal sinuses are notable for complete opacification of the right frontal sinus. Orbits are unremarkable.   Other: None.   IMPRESSION: No acute intracranial abnormality. Interval decrease in size of left basal ganglia hemorrhage, now measuring 10 x 7 mm, previously 20 x 12 mm. Resolution of intraventricular blood products. No hydrocephalus.    PATIENT SURVEYS:    Patient-Specific Activity Scoring Scheme  "0" represents "unable to perform." "10" represents "able to perform at prior level. 0 1 2 3 4 5 6 7 8 9  10 (Date and Score)   Activity Eval     1. Walking straight without the cane  4     2. Strength in RLE   4    3. Coordinated movements of R LE  3   4.    5.    Score 3.7    Total score = sum of the  activity scores/number of activities Minimum detectable change (90%CI) for average score = 2 points Minimum detectable change (90%CI) for single activity score = 3 points     COGNITION: Overall cognitive status: Within functional limits for tasks assessed     SENSATION: Not tested      LOWER EXTREMITY MMT:  MMT Right eval Left eval  Hip flexion 4 5  Hip extension    Hip abduction    Hip adduction    Hip internal rotation    Hip external rotation    Knee flexion 4 5  Knee extension 4 5  Ankle dorsiflexion 1 at best (in AFO, reports no movement in ankle)   Ankle plantarflexion    Ankle inversion    Ankle eversion     (Blank rows = not tested)    FUNCTIONAL TESTS:  5 times sit to stand: 12.8 seconds some off shift from R LE  Timed up and go (TUG): deferred  3 minute walk test: 267ft [facility]     10/31/23 0001  Standardized Balance Assessment  Standardized Balance Assessment Dynamic Gait Index  Dynamic Gait Index  Level Surface 2  Change in Gait Speed 1  Gait with Horizontal Head Turns 2  Gait with  Vertical Head Turns 2  Gait and Pivot Turn 1  Step Over Obstacle 1  Step Around Obstacles 1  Steps 0  Total Score 10    GAIT: Distance walked: 27ft  Assistive device utilized: Single point cane Level of assistance: Modified independence Comments: wide BOS, increased tone in R LE with gait pattern- limited knee ROM and ankle tends to invert a bit , mild unsteadiness with corners, limited rotation at trunk/hips                                                                                                                                 TREATMENT DATE:   11/20/23  Forward step ups 4 inch step x15 B Lateral step ups 4 inch step x15 B Nustep L5x4 minutes all four extremities, then another 4 minutes at L6  STS green band above knees x10 cues for equal  Forward and backward step overs  over half foam roll with U UE support (HEP practice)   Side steps blue foam pad x3 rounds up to MinA for balance  Lateral side steps over half foam rolls x3 laps difficulty with foot clearance     11/15/23  Nustep L5x8 minutes all four extremities for w/u and neural priming  Forward step ups 4 inch step x15 B Lateral step ups 4 inch step x12 B U UE support on rail STS yellow TB goblet hold x15    Ipsilateral target taps on 4 inch step, cues for foot clearance and up to ModA for balance recovery  Cross midline target taps on 4 inch step, cues for foot clearance, intermittent MinA  Forward and lateral step overs half foam rolls in // bars x3  laps each    11/12/23 Leg press 20# 2x10  NuStep L5 x all extremities, x3 just LE's  Side steps in bars Balance on airex  -eyes open -eyes closed -feet together -cone taps  -3 way taps  Step overs 4" stairs  Seated box taps 5# forward and to the side 6"    11/09/23 Standing Heel/toe raise 2x10 Gastroc stretch x 30" Soleus stretch x 30" Staggered stance fwd/bwd weight shifts 2x10 R&L Staggered stance heel/toe x10 3 way clock reach, wash rag  under foot 2x10 R&L Amb SPC with R AFO, working on heel/toe pattern and symmetrical step lengths multiple 2x3 laps around gym, clockwise and counter clockwise  11/05/23 NuStep L5x17mins, did just LE after 3 mins  Step ups 4" Leg ext 3# 2x10 HS curls red 2x10 Rocker board  Side steps on beam STS x10 then with OHP   10/31/23  Eval, POC, HEP- lots of education on stroke recovery and 6 month window for optimal regains of strength/balance/function, encouraged him to keep doing HEP from HHPT as well as balance work he found for himself, has been able to get on/off and use his TM safely at home so encouraged this as well  Nustep L5 x5 minutes BLEs only      PATIENT EDUCATION:  Education details: exam findings, POC, HEP, otherwise as above  Person educated: Patient Education method: Programmer, multimedia, Demonstration, and Handouts Education comprehension: verbalized understanding, returned demonstration, and needs further education  HOME EXERCISE PROGRAM:  Access Code: [email or web address] URL: [email or web address] Date: 11/20/2023 Prepared by: Terrel Ferries  Exercises - Heel Toe Raises with Counter Support  - 1 x daily - 7 x weekly - 2 sets - 10 reps - Stride Stance Weight Shift  - 1 x daily - 7 x weekly - 2 sets - 10 reps - Single Leg Balance with Clock Reach  - 1 x daily - 7 x weekly - 2 sets - 10 reps - Sit to Stand with Resistance Around Legs  - 1 x daily - 7 x weekly - 2 sets - 10 reps - Forward Step Over with Counter Support  - 1 x daily - 7 x weekly - 2 sets - 10 reps - Backward Step Over with Counter Support  - 1 x daily - 7 x weekly - 2 sets - 10 reps   ASSESSMENT:  CLINICAL IMPRESSION:  Pt arrives today feeling well, we continued to work on a good mix of functional strength and balance as well as functional activity tolerance this session again. Updated HEP as appropriate, he does continue to have difficulty with foot clearance, we continued to focus on this during session  as well. Still very motivated to improve.    From eval: Patient is a 68 y.o. M who was seen today for physical therapy evaluation and treatment for I61.0 (ICD-10-CM) - Nontraumatic subcortical hemorrhage of left cerebral hemisphere [facility]). He is very motivated and moving a very high level given the hemorrhagic CVA. His main concerns are balance/coordination and getting back to walking without assistive device. Anticipate he will do very well with skilled PT services.   OBJECTIVE IMPAIRMENTS: Abnormal gait, decreased activity tolerance, decreased balance, decreased coordination, decreased knowledge of use of DME, decreased mobility, difficulty walking, decreased strength, and decreased safety awareness.   ACTIVITY LIMITATIONS: standing, squatting, stairs, transfers, and locomotion level  PARTICIPATION LIMITATIONS: driving, shopping, community activity, and yard work  PERSONAL FACTORS: Age, Behavior pattern, Fitness, Past/current experiences, Social background, Time since onset of injury/illness/exacerbation,  and Transportation are also affecting patient's functional outcome.   REHAB POTENTIAL: Good  CLINICAL DECISION MAKING: Evolving/moderate complexity  EVALUATION COMPLEXITY: Moderate   GOALS: Goals reviewed with patient? No  SHORT TERM GOALS: Target date: 11/28/2023   Will be compliant with appropriate progressive HEP  Baseline: Goal status: INITIAL  2.  Will score 14/24 on DGI  Baseline: 10 Goal status: INITIAL  3.  Will be able to perform all functional transfers without compensation strategies or offshift from hemi LE  Baseline:  Goal status: INITIAL  4.  Will be able to ambulate household distances without AD, Mod(I) Baseline:  Goal status: INITIAL    LONG TERM GOALS: Target date: 12/26/2023    MMT in R LE to be at least 4+/5 in hip and knee musculature  Baseline:  Goal status: INITIAL  2.  Will score 18/24 on DGI to show improved functional balance  Baseline:   Goal status: INITIAL  3.  Will be able to walk at least 293ft over uneven/unsteady surfaces without device and no more than S/ 538ft over even surfaces no device Mod(I) Baseline:  Goal status: INITIAL  4.  Will be able to ascend and descend steps without rail and minimal compensation strategies  Baseline:  Goal status: INITIAL  5.  Will be compliant with advanced HEP vs gym based program at DC to maintain functional gains  Baseline:  Goal status: INITIAL  6.  PSFS to improve by at least 3 points  Baseline: 3.7 Goal status: INITIAL   PLAN:  PT FREQUENCY: 2x/week  PT DURATION: 8 weeks  PLANNED INTERVENTIONS: [id number]- PT Re-evaluation, 97110-Therapeutic exercises, 97530- Therapeutic activity, 97112- Neuromuscular re-education, 97535- Self Care, [id number]- Manual therapy, [id number]- Gait training, [phone]- Orthotic Fit/training, [phone]- Aquatic Therapy, Stair training, Taping, Dry Needling, Cryotherapy, and Moist heat  PLAN FOR NEXT SESSION: strength, balance, gait training, functional conditioning. Motor control tasks/progressions for R LE. Keep working on foot clearance   Terrel Ferries, PT, DPT 11/20/23 11:41 AM

## 2023-11-20 NOTE — Therapy (Addendum)
 OUTPATIENT OCCUPATIONAL THERAPY NEURO TREATMENT  Patient Name: Adrian [name] MRN: [id number] DOB:12-Sep-1955, 68 y.o., male Today's Date: 11/20/2023  PCP: Dr. Darren Em REFERRING PROVIDER: Dr. Sharl Davies  END OF SESSION:  Visit: 5/25                                    Time in :9:34       Time out :10:14        Medicare      Past Medical History:  Diagnosis Date   AF (paroxysmal atrial fibrillation) (HCC) 11/29/2018   Atrial flutter (HCC) 10/12/2015   a. TEE 3/17 with ? LAA clot-->s/p TEE/DCCV 11/24/2015; s/p DCCV 12/2022;s/p ablation 06/2023   CAD (coronary artery disease), native coronary artery 12/06/2015   a. 10/2015 MV: EF  37%, reversible defect inferior apex, intermediate risk findings; b. 10/2015 Cath: 20% mid RCA.   Carotid artery stenosis    1-39% right ICA stenosis and occluded left ICA   Colonic diverticular abscess    Diverticulitis    History of chemotherapy 2005   Cisplatin   Hypothyroidism    NICM (nonischemic cardiomyopathy) (HCC) 10/14/2015   a. Tachy mediated?;  b. Echo 3/17 - Mild concentric LVH, EF 30-35%, anteroseptal, anterior, anterolateral, apical anterior, lateral hypokinesis, trivial MR, mild to moderately reduced RVSF; c. LHC 3/17 - mRCA 20%   Radiation NOv.3,2005-Dec. 15, 2005   6810 cGy in 30 fractions   Tonsil cancer [facility]) 2005   Dr Ava Lei.  XRT   Past Surgical History:  Procedure Laterality Date   A-FLUTTER ABLATION N/A 06/19/2023   Procedure: A-FLUTTER ABLATION;  Surgeon: Boyce Byes, MD;  Location: [facility] INVASIVE CV LAB;  Service: Cardiovascular;  Laterality: N/A;   APPENDECTOMY N/A 03/07/2019   Procedure: ROBOTIC ASSISTED APPENDECTOMY;  Surgeon: Candyce Champagne, MD;  Location: [facility] ORS;  Service: General;  Laterality: N/A;   CARDIAC CATHETERIZATION N/A 10/18/2015   Procedure: Left Heart Cath and Coronary Angiography;  Surgeon: Odie Benne, MD;  Location: [facility] INVASIVE CV LAB;  Service: Cardiovascular;  Laterality: N/A;    CARDIOVERSION N/A 11/24/2015   Procedure: CARDIOVERSION;  Surgeon: Loyde Rule, MD;  Location: [facility] ENDOSCOPY;  Service: Cardiovascular;  Laterality: N/A;   CARDIOVERSION N/A 12/20/2022   Procedure: CARDIOVERSION;  Surgeon: Bridgette Campus, MD;  Location: [facility] INVASIVE CV LAB;  Service: Cardiovascular;  Laterality: N/A;   CYSTOSCOPY WITH STENT PLACEMENT Bilateral 03/07/2019   Procedure: CYSTOSCOPY WITH BILATERAL FIREFLY INJECTION;  Surgeon: Andrez Banker, MD;  Location: [facility] ORS;  Service: Urology;  Laterality: Bilateral;   GASTROSTOMY TUBE PLACEMENT  07/04/2004   IR - G tube for tonsilar cancer   IR ANGIO INTRA EXTRACRAN SEL COM CAROTID INNOMINATE UNI R MOD SED  03/09/2019   IR ANGIO VERTEBRAL SEL VERTEBRAL UNI R MOD SED  03/09/2019   IR CT HEAD LTD  03/09/2019   IR PERCUTANEOUS ART THROMBECTOMY/INFUSION INTRACRANIAL INC DIAG ANGIO  03/09/2019   IR RADIOLOGIST EVAL & MGMT  12/18/2018   LAMINECTOMY     C5/placement of steel plate   LOOP RECORDER INSERTION N/A 06/19/2023   Procedure: LOOP RECORDER INSERTION;  Surgeon: Boyce Byes, MD;  Location: [facility] INVASIVE CV LAB;  Service: Cardiovascular;  Laterality: N/A;   NECK SURGERY  2003   replaced disk   RADIOLOGY WITH ANESTHESIA N/A 03/08/2019   Procedure: IR WITH ANESTHESIA;  Surgeon: Luellen Sages, MD;  Location: [facility] OR;  Service:  Radiology;  Laterality: N/A;   TEE WITHOUT CARDIOVERSION N/A 10/15/2015   Procedure: TRANSESOPHAGEAL ECHOCARDIOGRAM (TEE);  Surgeon: Darlis Eisenmenger, MD;  Location: [facility] ENDOSCOPY;  Service: Cardiovascular;  Laterality: N/A;   TEE WITHOUT CARDIOVERSION N/A 11/24/2015   Procedure: TRANSESOPHAGEAL ECHOCARDIOGRAM (TEE);  Surgeon: Loyde Rule, MD;  Location: [facility] ENDOSCOPY;  Service: Cardiovascular;  Laterality: N/A;   TEE WITHOUT CARDIOVERSION N/A 12/20/2022   Procedure: TRANSESOPHAGEAL ECHOCARDIOGRAM;  Surgeon: Bridgette Campus, MD;  Location: [facility] INVASIVE CV LAB;  Service: Cardiovascular;  Laterality: N/A;   XI ROBOTIC ASSISTED  COLOSTOMY TAKEDOWN N/A 03/07/2019   Procedure: XI ROBOTIC ASSISTED LOW ANTERIOR RESECTION, RIGID PROCTOSCOPY;  Surgeon: Candyce Champagne, MD;  Location: [facility] ORS;  Service: General;  Laterality: N/A;   Patient Active Problem List   Diagnosis Date Noted   Carotid occlusion, left 10/23/2023   ICH (intracerebral hemorrhage) (HCC) 08/11/2023   Carotid artery stenosis    Atrial flutter (HCC) 12/19/2022   Essential hypertension 04/22/2021   Hypokalemia 03/12/2019   Expressive aphasia 03/10/2019   Dysphagia 03/10/2019   Middle cerebral artery embolism, left 03/09/2019   Diverticular stricture (HCC) 03/07/2019   Bacteria in urine    Chronic atrial fibrillation (HCC) 12/02/2018   Current use of long term anticoagulation 12/02/2018   UTI (urinary tract infection) 11/29/2018   Hyponatremia 11/29/2018   PAF (paroxysmal atrial fibrillation) (HCC) 11/29/2018   Diverticulitis of large intestine with abscess 09/23/2018   Dyslipidemia 12/05/2017   CAD (coronary artery disease), native coronary artery 12/06/2015   SOB (shortness of breath)    NICM (nonischemic cardiomyopathy) (HCC)    Thyroid  activity decreased    Hypothyroidism 08/12/2013   History of radiation therapy 05/30/2012   Smokes tobacco daily 05/30/2012   Cancer of tonsillar fossa (HCC) 11/27/2011   Tonsil cancer (HCC) 2005   History of chemotherapy 2005    ONSET DATE: 08/11/23  REFERRING DIAG: I61.0 (ICD-10-CM) - Nontraumatic subcortical hemorrhage of left cerebral hemisphere (HCC)   THERAPY DIAG:  No diagnosis found.  Rationale for Evaluation and Treatment: Rehabilitation  SUBJECTIVE:   SUBJECTIVE STATEMENT: Pt reports exercising at home  Pt accompanied by: self  PERTINENT HISTORY:   68 yo male hospitalized 08/11/23 with R sided weakness. CT showed a large basal ganglia ICH with IVH extension. PMH includes:  A-fib, carotid artery stenosis, CAD, tonsil cancer, hypothyroidism, L MCA CVA.    PRECAUTIONS: Fall  WEIGHT BEARING  RESTRICTIONS: No  PAIN:  Are you having pain? No-   FALLS: Has patient fallen in last 6 months? No, pt. is being followed by P.T to address falls   LIVING ENVIRONMENT: Lives with: lives alone has friends and ex wife checks in on him Lives in: House/apartment Stairs: no Has following equipment at home: Single point cane and Tour manager  PLOF: Independent  PATIENT GOALS: improve use of RUE  OBJECTIVE:  Note: Objective measures were completed at Evaluation unless otherwise noted.  HAND DOMINANCE: Right  ADLs: Overall ADLs: mod I with all basic ADLS Transfers/ambulation related to ADLs: Eating: uses RUE 20%, currently using non dominant LUE,  Grooming: using non dominant LUE UB Dressing: mod I difficulty with buttons LB Dressing: mod I Toileting: mod I with cane Bathing: has tub bench Tub Shower transfers: mod I  Equipment: Transfer tub bench  IADLs: Shopping: needs assist for transportation Light housekeeping: Pt is performing light cleaning, he has occasional assist Meal Prep: microwave meals primarily, does a little stove top Medication management: pt handles  Financial management: pt  handles Handwriting: 90% legibility, handwriting is small   MOBILITY STATUS: mod I with cane  ACTIVITY TOLERANCE: Activity tolerance: standing  UPPER EXTREMITY ROM:  grossly WFLS  UPPER EXTREMITY MMT:     MMT Right eval Left eval  Shoulder flexion 3+/5 4+/5  Shoulder abduction    Shoulder adduction    Shoulder extension    Shoulder internal rotation    Shoulder external rotation    Middle trapezius    Lower trapezius    Elbow flexion 4-/5 4-/5  Elbow extension 4-/5 4-/5  Wrist flexion    Wrist extension    Wrist ulnar deviation    Wrist radial deviation    Wrist pronation    Wrist supination    (Blank rows = not tested)  HAND FUNCTION: Grip strength: Right: 50 lbs; Left: 63 lbs  COORDINATION: 9 Hole Peg test: Right: 59.69 sec; Left: 29.35  sec  SENSATION:diminished in RUE  COGNITION: Overall cognitive status:unable to spell WORLD backwards correctly, recalls 2/3 words following a delay.  VISION ASSESSMENT: Not tested- denies visual changes   OBSERVATIONS: Pleasant gentleman motivated to improve                                                                                                                              TREATMENT DATE:11/19/23 Closed chain shoulder flexion with foam roll 2 sets of 10 reps min v.c and facilitation for shoulder positioning and focus on control. Reviewed yellow theraband exercises 15 reps each bilateral UE's. Pt performed biceps  curls with bilateral UE's simultaneously with cues to have RUE mirror movements of LUE, min v.c Placing grooved pegs into pegboard with RUE for increased fine motor coordination, min difficulty/ v.c Removing pegs with tweezers, for sustained pinch for 1/2 the items,  mod difficulty/ drops   11/15/23 UBE x 6 mins level 1 for conditioning closed chain shoulder flexion with medium ball x 15 reps then with foam roll for send set min v.c for control. Yellow theraband for biceps curls and triceps extension, 10-15 reps each UE, min v.c copying small peg design with RUE min-mod difficulty/ drops, min v.c to avoid compensation.  11/12/23- Reviewed cane HEP 10-15 reps min v.c Functional reaching to place and retrieve clothespins(1-8#) from overhead target(vertical yardstick) with RUE for functional reach and sustained pinch, min difficulty, v.c Placing metal pegs( 3 sizes) into pegboard with RUE then removing with in hand manipulation, min difficulty/ v.c  Flipping and dealing cards with RUE for increased fine motor coordination, min v.c   11/09/23: Pt instructed in cane HEP.  Pt fatigued quickly and with difficulty with control, but responded well to cueing.  Also reviewed and added to coordination HEP.  Pt able to copy shopping list with good legibility, but several spelling  errors--pt reports difficulty with spelling if generating a list at home.   10/31/23- eval     PATIENT EDUCATION: Education details:   yellow theraband and shoulder flexion with paper towel  roll review Person educated: Patient Education method: Explanation, Demonstration, Tactile cues, Verbal cues, and Handouts Education comprehension: verbalized understanding, returned demonstration, and verbal cues required  HOME EXERCISE PROGRAM: beginning coordination 3/26 11/09/23  Cane HEP 4/10- biceps triceps with yellow band, shoulder flexion with paper towel roll  GOALS: Goals reviewed with patient? Yes  SHORT TERM GOALS: Target date: 12/01/23  I with inital HEP Goal status: met for cane  2.  Pt will report feeding himself at least 50% of the time with RUE. Baseline: uses 20% x Goal status:  ongoing  3.  Pt will demonstrate improved RUE fine motor coordination for ADLs as evidenced by decreasing 9 hole peg test score to 52 secs or less Goal status: ongoing  4.  Pt will report consistently brushing his teeth with RUE Goal status: ongoing  5.  I with memory compensation strategies Goal status: ongoing  6.  Pt will report that he has resumed moderate level home management mod  Goal status: ongoing   LONG TERM GOALS: Target date: 01/23/24  I with updated HEP Goal status: INITIAL  2.  Pt will demonstrate improved RUE fine motor coordination for ADLs as evidenced by decreasing 9 hole peg test score to 48 secs or less Goal status: INITIAL  3.  Pt will demonstrate ability to carry a plate in RUE without spills. Goal status: INITIAL  4.  Pt will write a short paragraph with 100% legibility and minimal decrease in letter size. Goal status: INITIAL  5.  Pt will resume use of RUE as dominant hand at least 75% of the time for ADLs/ IADLs. Goal status: INITIAL  6. Pt will retrieve a 3 lbs object from eye level shelf using RUE with good control  Goal status:  inital  ASSESSMENT:  CLINICAL IMPRESSION: Pt is progressing towards goals. Pt demonstrates understanding of HEP following review. Pt continues to demonstrate challenges with fine motor coordination, however it is improving with repetition.  PERFORMANCE DEFICITS: in functional skills including ADLs, IADLs, coordination, dexterity, sensation, strength, flexibility, Fine motor control, Gross motor control, mobility, balance, endurance, decreased knowledge of precautions, decreased knowledge of use of DME, and UE functional use, cognitive skills including attention, memory, problem solving, safety awareness, and thought, and psychosocial skills including coping strategies, environmental adaptation, habits, interpersonal interactions, and routines and behaviors.   IMPAIRMENTS: are limiting patient from ADLs, IADLs, play, leisure, and social participation.   CO-MORBIDITIES: may have co-morbidities  that affects occupational performance. Patient will benefit from skilled OT to address above impairments and improve overall function.  MODIFICATION OR ASSISTANCE TO COMPLETE EVALUATION: No modification of tasks or assist necessary to complete an evaluation.  OT OCCUPATIONAL PROFILE AND HISTORY: Detailed assessment: Review of records and additional review of physical, cognitive, psychosocial history related to current functional performance.  CLINICAL DECISION MAKING: LOW - limited treatment options, no task modification necessary  REHAB POTENTIAL: Good  EVALUATION COMPLEXITY: Low    PLAN:  OT FREQUENCY: 2x/week  OT DURATION: 12 weeks  PLANNED INTERVENTIONS: 97168 OT Re-evaluation, 97535 self care/ADL training, 16109 therapeutic exercise, 97530 therapeutic activity, 97112 neuromuscular re-education, 97140 manual therapy, 97113 aquatic therapy, 97035 ultrasound, 97018 paraffin, 60454 moist heat, 97010 cryotherapy, 97034 contrast bath, 97014 electrical stimulation unattended, 97129 Cognitive  training (first 15 min), 09811 Cognitive training(each additional 15 min), 91478 Orthotics management and training, 29562 Splinting (initial encounter), passive range of motion, balance training, functional mobility training, energy conservation, coping strategies training, patient/family education, and DME and/or AE instructions  RECOMMENDED OTHER  SERVICES: n/a  CONSULTED AND AGREED WITH PLAN OF CARE: Patient  PLAN FOR NEXT SESSION: continue RUE functional use.   Murriel Eidem, OTR/L 11/20/2023, 9:36 AM

## 2023-11-21 ENCOUNTER — Encounter: Payer: Self-pay | Admitting: Speech Pathology

## 2023-11-21 ENCOUNTER — Other Ambulatory Visit: Payer: Self-pay

## 2023-11-21 ENCOUNTER — Ambulatory Visit

## 2023-11-21 ENCOUNTER — Other Ambulatory Visit (HOSPITAL_COMMUNITY): Payer: Self-pay | Admitting: Pharmacist

## 2023-11-21 ENCOUNTER — Ambulatory Visit: Admitting: Occupational Therapy

## 2023-11-21 ENCOUNTER — Ambulatory Visit: Admitting: Speech Pathology

## 2023-11-21 DIAGNOSIS — R278 Other lack of coordination: Secondary | ICD-10-CM

## 2023-11-21 DIAGNOSIS — R41841 Cognitive communication deficit: Secondary | ICD-10-CM | POA: Diagnosis not present

## 2023-11-21 DIAGNOSIS — M6281 Muscle weakness (generalized): Secondary | ICD-10-CM

## 2023-11-21 DIAGNOSIS — R2689 Other abnormalities of gait and mobility: Secondary | ICD-10-CM

## 2023-11-21 DIAGNOSIS — R2681 Unsteadiness on feet: Secondary | ICD-10-CM

## 2023-11-21 DIAGNOSIS — R471 Dysarthria and anarthria: Secondary | ICD-10-CM

## 2023-11-21 DIAGNOSIS — R41840 Attention and concentration deficit: Secondary | ICD-10-CM

## 2023-11-21 DIAGNOSIS — R41844 Frontal lobe and executive function deficit: Secondary | ICD-10-CM

## 2023-11-21 MED ORDER — STUDY - ASPIRE - APIXABAN 5 MG OR PLACEBO TABLET (PI-SETHI): 5.0000 mg | ORAL_TABLET | Freq: Two times a day (BID) | ORAL | 0 refills | Status: DC

## 2023-11-21 MED ORDER — STUDY - ASPIRE - ASPIRIN 81 MG OR PLACEBO TABLET (PI-SETHI): 81.0000 mg | ORAL_TABLET | Freq: Every day | ORAL | 0 refills | Status: DC

## 2023-11-21 NOTE — Therapy (Signed)
 OUTPATIENT PHYSICAL THERAPY TREATMENT   Patient Name: KANOA PHILLIPPI MRN: [id number] DOB:1956/01/27, 68 y.o., male Today's Date: 11/21/2023  END OF SESSION:  PT End of Session - 11/21/23 0928     Visit Number 7    Date for PT Re-Evaluation 12/26/23    Authorization Type MCR    Authorization Time Period 10/31/23 to 12/26/23    Progress Note Due on Visit 10    PT Start Time 0929    PT Stop Time 1014    PT Time Calculation (min) 45 min    Activity Tolerance Patient tolerated treatment well    Behavior During Therapy [facility] for tasks assessed/performed                   Past Medical History:  Diagnosis Date   AF (paroxysmal atrial fibrillation) (HCC) 11/29/2018   Atrial flutter (HCC) 10/12/2015   a. TEE 3/17 with ? LAA clot-->s/p TEE/DCCV 11/24/2015; s/p DCCV 12/2022;s/p ablation 06/2023   CAD (coronary artery disease), native coronary artery 12/06/2015   a. 10/2015 MV: EF  37%, reversible defect inferior apex, intermediate risk findings; b. 10/2015 Cath: 20% mid RCA.   Carotid artery stenosis    1-39% right ICA stenosis and occluded left ICA   Colonic diverticular abscess    Diverticulitis    History of chemotherapy 2005   Cisplatin   Hypothyroidism    NICM (nonischemic cardiomyopathy) (HCC) 10/14/2015   a. Tachy mediated?;  b. Echo 3/17 - Mild concentric LVH, EF 30-35%, anteroseptal, anterior, anterolateral, apical anterior, lateral hypokinesis, trivial MR, mild to moderately reduced RVSF; c. LHC 3/17 - mRCA 20%   Radiation NOv.3,2005-Dec. 15, 2005   6810 cGy in 30 fractions   Tonsil cancer [facility]) 2005   Dr Otila Kluver.  XRT   Past Surgical History:  Procedure Laterality Date   A-FLUTTER ABLATION N/A 06/19/2023   Procedure: A-FLUTTER ABLATION;  Surgeon: Lanier Prude, MD;  Location: [facility] INVASIVE CV LAB;  Service: Cardiovascular;  Laterality: N/A;   APPENDECTOMY N/A 03/07/2019   Procedure: ROBOTIC ASSISTED APPENDECTOMY;  Surgeon: Karie Soda, MD;  Location: [facility] ORS;   Service: General;  Laterality: N/A;   CARDIAC CATHETERIZATION N/A 10/18/2015   Procedure: Left Heart Cath and Coronary Angiography;  Surgeon: Kathleene Hazel, MD;  Location: [facility] INVASIVE CV LAB;  Service: Cardiovascular;  Laterality: N/A;   CARDIOVERSION N/A 11/24/2015   Procedure: CARDIOVERSION;  Surgeon: Wendall Stade, MD;  Location: [facility] ENDOSCOPY;  Service: Cardiovascular;  Laterality: N/A;   CARDIOVERSION N/A 12/20/2022   Procedure: CARDIOVERSION;  Surgeon: [name], MD;  Location: [facility] INVASIVE CV LAB;  Service: Cardiovascular;  Laterality: N/A;   CYSTOSCOPY WITH STENT PLACEMENT Bilateral 03/07/2019   Procedure: CYSTOSCOPY WITH BILATERAL FIREFLY INJECTION;  Surgeon: Crist Fat, MD;  Location: [facility] ORS;  Service: Urology;  Laterality: Bilateral;   GASTROSTOMY TUBE PLACEMENT  07/04/2004   IR - G tube for tonsilar cancer   IR ANGIO INTRA EXTRACRAN SEL COM CAROTID INNOMINATE UNI R MOD SED  03/09/2019   IR ANGIO VERTEBRAL SEL VERTEBRAL UNI R MOD SED  03/09/2019   IR CT HEAD LTD  03/09/2019   IR PERCUTANEOUS ART THROMBECTOMY/INFUSION INTRACRANIAL INC DIAG ANGIO  03/09/2019   IR RADIOLOGIST EVAL & MGMT  12/18/2018   LAMINECTOMY     C5/placement of steel plate   LOOP RECORDER INSERTION N/A 06/19/2023   Procedure: LOOP RECORDER INSERTION;  Surgeon: Lanier Prude, MD;  Location: [facility] INVASIVE CV LAB;  Service: Cardiovascular;  Laterality: N/A;   NECK SURGERY  2003   replaced disk   RADIOLOGY WITH ANESTHESIA N/A 03/08/2019   Procedure: IR WITH ANESTHESIA;  Surgeon: Julieanne Cotton, MD;  Location: [facility] OR;  Service: Radiology;  Laterality: N/A;   TEE WITHOUT CARDIOVERSION N/A 10/15/2015   Procedure: TRANSESOPHAGEAL ECHOCARDIOGRAM (TEE);  Surgeon: Laurey Morale, MD;  Location: [facility] ENDOSCOPY;  Service: Cardiovascular;  Laterality: N/A;   TEE WITHOUT CARDIOVERSION N/A 11/24/2015   Procedure: TRANSESOPHAGEAL ECHOCARDIOGRAM (TEE);  Surgeon: Wendall Stade, MD;  Location: [facility] ENDOSCOPY;  Service:  Cardiovascular;  Laterality: N/A;   TEE WITHOUT CARDIOVERSION N/A 12/20/2022   Procedure: TRANSESOPHAGEAL ECHOCARDIOGRAM;  Surgeon: [name], MD;  Location: [facility] INVASIVE CV LAB;  Service: Cardiovascular;  Laterality: N/A;   XI ROBOTIC ASSISTED COLOSTOMY TAKEDOWN N/A 03/07/2019   Procedure: XI ROBOTIC ASSISTED LOW ANTERIOR RESECTION, RIGID PROCTOSCOPY;  Surgeon: Karie Soda, MD;  Location: [facility] ORS;  Service: General;  Laterality: N/A;   Patient Active Problem List   Diagnosis Date Noted   Carotid occlusion, left 10/23/2023   ICH (intracerebral hemorrhage) (HCC) 08/11/2023   Carotid artery stenosis    Atrial flutter (HCC) 12/19/2022   Essential hypertension 04/22/2021   Hypokalemia 03/12/2019   Expressive aphasia 03/10/2019   Dysphagia 03/10/2019   Middle cerebral artery embolism, left 03/09/2019   Diverticular stricture (HCC) 03/07/2019   Bacteria in urine    Chronic atrial fibrillation (HCC) 12/02/2018   Current use of long term anticoagulation 12/02/2018   UTI (urinary tract infection) 11/29/2018   Hyponatremia 11/29/2018   PAF (paroxysmal atrial fibrillation) (HCC) 11/29/2018   Diverticulitis of large intestine with abscess 09/23/2018   Dyslipidemia 12/05/2017   CAD (coronary artery disease), native coronary artery 12/06/2015   SOB (shortness of breath)    NICM (nonischemic cardiomyopathy) (HCC)    Thyroid activity decreased    Hypothyroidism 08/12/2013   History of radiation therapy 05/30/2012   Smokes tobacco daily 05/30/2012   Cancer of tonsillar fossa (HCC) 11/27/2011   Tonsil cancer (HCC) 2005   History of chemotherapy 2005    PCP: Evelena Peat MD   REFERRING PROVIDER: Erick Colace, MD  REFERRING DIAG: I61.0 (ICD-10-CM) - Nontraumatic subcortical hemorrhage of left cerebral hemisphere [facility])  THERAPY DIAG:  Muscle weakness (generalized)  Unsteadiness on feet  Other lack of coordination  Rationale for Evaluation and Treatment:  Rehabilitation  ONSET DATE: 08/11/23  SUBJECTIVE:   SUBJECTIVE STATEMENT: No new issues, trying to walk outside some, did not today as he was coming in here  PERTINENT HISTORY: 1.  History of left basal ganglia hemorrhage approximately 2 and half months ago.  He has completed inpatient rehabilitation as well as home health.  I do think he would be a great candidate for outpatient therapy.  He has gotten back to modified independent level but still has some mild weakness on the right side as well as fine motor issues and motor control issues.  His balance is also off.  He continues wear right AFO because of foot and ankle weakness.  Patient has some short-term memory issues as well Will make referral for outpatient PT OT and speech therapy Physical medicine rehab follow-up in 3 months  PAIN:  Are you having pain? No   PRECAUTIONS: Fall and Other: R hemi, has loop recorder   RED FLAGS: None   WEIGHT BEARING RESTRICTIONS: No  FALLS:  Has patient fallen in last 6 months? No  LIVING ENVIRONMENT: Lives with: lives alone Lives in: House/apartment Stairs: 2  STE with rail  Has following equipment at home: Single point cane and Walker - 2 wheeled  OCCUPATION: retired- used to work for [employer]   PLOF: Independent, Independent with basic ADLs, Independent with gait, Independent with transfers, and Requires assistive device for independence  PATIENT GOALS: walk straight without a cane, get back to driving and motorcycling   NEXT MD VISIT: Referring 01/25/24  OBJECTIVE:  Note: Objective measures were completed at Evaluation unless otherwise noted.  DIAGNOSTIC FINDINGS:   CLINICAL DATA:  Stroke, follow up Neuro deficit, acute, stroke suspected   EXAM: MRI HEAD WITHOUT AND WITH CONTRAST   TECHNIQUE: Multiplanar, multiecho pulse sequences of the brain and surrounding structures were obtained without and with intravenous contrast.   CONTRAST:  6.25mL GADAVIST GADOBUTROL 1 MMOL/ML IV  SOLN   COMPARISON:  CT head from today.   FINDINGS: Brain: When comparing across modalities, no substantial change in intraparenchymal hemorrhage in the left basal ganglia with intraventricular extension. No visible surrounding acute hemorrhage or mass lesion; however, acute blood products limits assessment. Prior left frontal infarct with encephalomalacia. No hydrocephalus. No pathologic enhancement.   Vascular: Major arterial flow voids are maintained at the skull base.   Skull and upper cervical spine: Normal marrow signal.   Sinuses/Orbits: Right frontal sinus opacification. Remaining sinuses are clear. No acute orbital findings.   Other: No mastoid effusions.   IMPRESSION: 1. When comparing across modalities, no substantial change in intraparenchymal hemorrhage in the left basal ganglia with intraventricular extension. No progressive mass effect. 2. No visible surrounding acute hemorrhage or mass lesion; however, acute blood products limits assessment  EXAM: CT HEAD WITHOUT CONTRAST   TECHNIQUE: Contiguous axial images were obtained from the base of the skull through the vertex without intravenous contrast.   RADIATION DOSE REDUCTION: This exam was performed according to the departmental dose-optimization program which includes automated exposure control, adjustment of the mA and/or kV according to patient size and/or use of iterative reconstruction technique.   COMPARISON:  Head CT 08/12/2023   FINDINGS: Brain: Interval decrease in size of the previously seen hemorrhage in the left basal ganglia, now measuring 10 x 7 mm, previously 20 x 12 mm. There is resolution of intraventricular blood products. No hydrocephalus. No extra-axial fluid collection. No mass effect. No mass lesion. No CT evidence of an acute cortical infarct. There is chronic left MCA territory infarct involving the left opercular region.   Vascular: No hyperdense vessel or unexpected  calcification.   Skull: Normal. Negative for fracture or focal lesion.   Sinuses/Orbits: No middle ear or mastoid effusion. Paranasal sinuses are notable for complete opacification of the right frontal sinus. Orbits are unremarkable.   Other: None.   IMPRESSION: No acute intracranial abnormality. Interval decrease in size of left basal ganglia hemorrhage, now measuring 10 x 7 mm, previously 20 x 12 mm. Resolution of intraventricular blood products. No hydrocephalus.    PATIENT SURVEYS:    Patient-Specific Activity Scoring Scheme  "0" represents "unable to perform." "10" represents "able to perform at prior level. 0 1 2 3 4 5 6 7 8 9  10 (Date and Score)   Activity Eval     1. Walking straight without the cane  4     2. Strength in RLE   4    3. Coordinated movements of R LE  3   4.    5.    Score 3.7    Total score = sum of the activity scores/number of activities Minimum detectable  change (90%CI) for average score = 2 points Minimum detectable change (90%CI) for single activity score = 3 points     COGNITION: Overall cognitive status: Within functional limits for tasks assessed     SENSATION: Not tested      LOWER EXTREMITY MMT:  MMT Right eval Left eval  Hip flexion 4 5  Hip extension    Hip abduction    Hip adduction    Hip internal rotation    Hip external rotation    Knee flexion 4 5  Knee extension 4 5  Ankle dorsiflexion 1 at best (in AFO, reports no movement in ankle)   Ankle plantarflexion    Ankle inversion    Ankle eversion     (Blank rows = not tested)    FUNCTIONAL TESTS:  5 times sit to stand: 12.8 seconds some off shift from R LE  Timed up and go (TUG): deferred  3 minute walk test: 235ft [facility]     10/31/23 0001  Standardized Balance Assessment  Standardized Balance Assessment Dynamic Gait Index  Dynamic Gait Index  Level Surface 2  Change in Gait Speed 1  Gait with Horizontal Head Turns 2  Gait with Vertical Head Turns  2  Gait and Pivot Turn 1  Step Over Obstacle 1  Step Around Obstacles 1  Steps 0  Total Score 10    GAIT: Distance walked: 248ft  Assistive device utilized: Single point cane Level of assistance: Modified independence Comments: wide BOS, increased tone in R LE with gait pattern- limited knee ROM and ankle tends to invert a bit , mild unsteadiness with corners, limited rotation at trunk/hips                                                                                                                                 TREATMENT DATE:  11/21/23:  Therapeutic activities and Neuromuscular reeducation:  Gait outdoors, x 7 min with st cane on asphalt, mild incline, curb , with SBA by PT In ll bars for B heel raises  In ll bars for staggered standing with L foot on airex In ll bars alt toe taps to 2 cones  In ll bars for 3 way hip L LE Then in ll bars for towel slides L Le with R wbing emphasis on maintaining upright R hip position, tends to sink, trendelenburg to R and hyperextend R knee which may be contributing to catching R toe, mass practice  Leg press, 30# B 20x Leg press R LE only 30# 20 x 2 Marching with walking, 20' x 2 with st cane      11/20/23  Forward step ups 4 inch step x15 B Lateral step ups 4 inch step x15 B Nustep L5x4 minutes all four extremities, then another 4 minutes at L6  STS green band above knees x10 cues for equal  Forward and backward step overs  over half foam roll with U UE  support (HEP practice)   Side steps blue foam pad x3 rounds up to MinA for balance  Lateral side steps over half foam rolls x3 laps difficulty with foot clearance     11/15/23  Nustep L5x8 minutes all four extremities for w/u and neural priming  Forward step ups 4 inch step x15 B Lateral step ups 4 inch step x12 B U UE support on rail STS yellow TB goblet hold x15    Ipsilateral target taps on 4 inch step, cues for foot clearance and up to ModA for balance recovery  Cross  midline target taps on 4 inch step, cues for foot clearance, intermittent MinA  Forward and lateral step overs half foam rolls in // bars x3 laps each    11/12/23 Leg press 20# 2x10  NuStep L5 x all extremities, x3 just LE's  Side steps in bars Balance on airex  -eyes open -eyes closed -feet together -cone taps  -3 way taps  Step overs 4" stairs  Seated box taps 5# forward and to the side 6"    11/09/23 Standing Heel/toe raise 2x10 Gastroc stretch x 30" Soleus stretch x 30" Staggered stance fwd/bwd weight shifts 2x10 R&L Staggered stance heel/toe x10 3 way clock reach, wash rag under foot 2x10 R&L Amb SPC with R AFO, working on heel/toe pattern and symmetrical step lengths multiple 2x3 laps around gym, clockwise and counter clockwise  11/05/23 NuStep L5x77mins, did just LE after 3 mins  Step ups 4" Leg ext 3# 2x10 HS curls red 2x10 Rocker board  Side steps on beam STS x10 then with OHP   10/31/23  Eval, POC, HEP- lots of education on stroke recovery and 6 month window for optimal regains of strength/balance/function, encouraged him to keep doing HEP from HHPT as well as balance work he found for himself, has been able to get on/off and use his TM safely at home so encouraged this as well  Nustep L5 x5 minutes BLEs only      PATIENT EDUCATION:  Education details: exam findings, POC, HEP, otherwise as above  Person educated: Patient Education method: Programmer, multimedia, Demonstration, and Handouts Education comprehension: verbalized understanding, returned demonstration, and needs further education  HOME EXERCISE PROGRAM:  Access Code: [email or web address] URL: [email or web address] Date: 11/20/2023 Prepared by: Terrel Ferries  Exercises - Heel Toe Raises with Counter Support  - 1 x daily - 7 x weekly - 2 sets - 10 reps - Stride Stance Weight Shift  - 1 x daily - 7 x weekly - 2 sets - 10 reps - Single Leg Balance with Clock Reach  - 1 x daily - 7 x weekly - 2  sets - 10 reps - Sit to Stand with Resistance Around Legs  - 1 x daily - 7 x weekly - 2 sets - 10 reps - Forward Step Over with Counter Support  - 1 x daily - 7 x weekly - 2 sets - 10 reps - Backward Step Over with Counter Support  - 1 x daily - 7 x weekly - 2 sets - 10 reps   ASSESSMENT:  CLINICAL IMPRESSION:  Pt catching R toe at times with walking , more so when outside today.  Tried to address his hip stability and motor control to prevent him from "sinking down" so much on his R side with gait.  He tolerated, well, worked very hard.  Discussed trying to reduce his stride length on L somewhat to prevent R toe catching as frequently.  He will  benefit from ongoing skilled PT to address his deficits from CVA and improve his efficiency with motor tasks, walking.   From eval: Patient is a 68 y.o. M who was seen today for physical therapy evaluation and treatment for I61.0 (ICD-10-CM) - Nontraumatic subcortical hemorrhage of left cerebral hemisphere [facility]). He is very motivated and moving a very high level given the hemorrhagic CVA. His main concerns are balance/coordination and getting back to walking without assistive device. Anticipate he will do very well with skilled PT services.   OBJECTIVE IMPAIRMENTS: Abnormal gait, decreased activity tolerance, decreased balance, decreased coordination, decreased knowledge of use of DME, decreased mobility, difficulty walking, decreased strength, and decreased safety awareness.   ACTIVITY LIMITATIONS: standing, squatting, stairs, transfers, and locomotion level  PARTICIPATION LIMITATIONS: driving, shopping, community activity, and yard work  PERSONAL FACTORS: Age, Behavior pattern, Fitness, Past/current experiences, Social background, Time since onset of injury/illness/exacerbation, and Transportation are also affecting patient's functional outcome.   REHAB POTENTIAL: Good  CLINICAL DECISION MAKING: Evolving/moderate complexity  EVALUATION  COMPLEXITY: Moderate   GOALS: Goals reviewed with patient? No  SHORT TERM GOALS: Target date: 11/28/2023   Will be compliant with appropriate progressive HEP  Baseline: Goal status: INITIAL  2.  Will score 14/24 on DGI  Baseline: 10 Goal status: INITIAL  3.  Will be able to perform all functional transfers without compensation strategies or offshift from hemi LE  Baseline:  Goal status: INITIAL  4.  Will be able to ambulate household distances without AD, Mod(I) Baseline:  Goal status: INITIAL    LONG TERM GOALS: Target date: 12/26/2023    MMT in R LE to be at least 4+/5 in hip and knee musculature  Baseline:  Goal status: INITIAL  2.  Will score 18/24 on DGI to show improved functional balance  Baseline:  Goal status: INITIAL  3.  Will be able to walk at least 254ft over uneven/unsteady surfaces without device and no more than S/ 567ft over even surfaces no device Mod(I) Baseline:  Goal status: INITIAL  4.  Will be able to ascend and descend steps without rail and minimal compensation strategies  Baseline:  Goal status: INITIAL  5.  Will be compliant with advanced HEP vs gym based program at DC to maintain functional gains  Baseline:  Goal status: INITIAL  6.  PSFS to improve by at least 3 points  Baseline: 3.7 Goal status: INITIAL   PLAN:  PT FREQUENCY: 2x/week  PT DURATION: 8 weeks  PLANNED INTERVENTIONS: [id number]- PT Re-evaluation, 97110-Therapeutic exercises, 97530- Therapeutic activity, 97112- Neuromuscular re-education, 97535- Self Care, 16109- Manual therapy, [id number]- Gait training, [id number]- Orthotic Fit/training, [phone]- Aquatic Therapy, Stair training, Taping, Dry Needling, Cryotherapy, and Moist heat  PLAN FOR NEXT SESSION: strength, balance, gait training, functional conditioning. Motor control tasks/progressions for R LE. Keep working on foot clearance   Liyanna Cartwright,PT, DPT, OCS 11/21/23 12:07 PM

## 2023-11-21 NOTE — Therapy (Signed)
 OUTPATIENT OCCUPATIONAL THERAPY NEURO TREATMENT  Patient Name: KARO ROG MRN: [id number] DOB:01-14-1956, 68 y.o., male Today's Date: 11/21/2023  PCP: Dr. Caryl Never REFERRING PROVIDER: Dr. Wynn Banker  END OF SESSION:  OT End of Session - 11/21/23 1101     Visit Number 6    Number of Visits 25    Authorization Type Medicare    Authorization - Visit Number 6    Progress Note Due on Visit 10    OT Start Time 1100    OT Stop Time 1140    OT Time Calculation (min) 40 min    Activity Tolerance Patient tolerated treatment well    Behavior During Therapy [facility] for tasks assessed/performed                Past Medical History:  Diagnosis Date   AF (paroxysmal atrial fibrillation) (HCC) 11/29/2018   Atrial flutter (HCC) 10/12/2015   a. TEE 3/17 with ? LAA clot-->s/p TEE/DCCV 11/24/2015; s/p DCCV 12/2022;s/p ablation 06/2023   CAD (coronary artery disease), native coronary artery 12/06/2015   a. 10/2015 MV: EF  37%, reversible defect inferior apex, intermediate risk findings; b. 10/2015 Cath: 20% mid RCA.   Carotid artery stenosis    1-39% right ICA stenosis and occluded left ICA   Colonic diverticular abscess    Diverticulitis    History of chemotherapy 2005   Cisplatin   Hypothyroidism    NICM (nonischemic cardiomyopathy) (HCC) 10/14/2015   a. Tachy mediated?;  b. Echo 3/17 - Mild concentric LVH, EF 30-35%, anteroseptal, anterior, anterolateral, apical anterior, lateral hypokinesis, trivial MR, mild to moderately reduced RVSF; c. LHC 3/17 - mRCA 20%   Radiation NOv.3,2005-Dec. 15, 2005   6810 cGy in 30 fractions   Tonsil cancer [facility]) 2005   Dr Otila Kluver.  XRT   Past Surgical History:  Procedure Laterality Date   A-FLUTTER ABLATION N/A 06/19/2023   Procedure: A-FLUTTER ABLATION;  Surgeon: Lanier Prude, MD;  Location: [facility] INVASIVE CV LAB;  Service: Cardiovascular;  Laterality: N/A;   APPENDECTOMY N/A 03/07/2019   Procedure: ROBOTIC ASSISTED APPENDECTOMY;  Surgeon: Karie Soda, MD;  Location: [facility] ORS;  Service: General;  Laterality: N/A;   CARDIAC CATHETERIZATION N/A 10/18/2015   Procedure: Left Heart Cath and Coronary Angiography;  Surgeon: Kathleene Hazel, MD;  Location: [facility] INVASIVE CV LAB;  Service: Cardiovascular;  Laterality: N/A;   CARDIOVERSION N/A 11/24/2015   Procedure: CARDIOVERSION;  Surgeon: Wendall Stade, MD;  Location: [facility] ENDOSCOPY;  Service: Cardiovascular;  Laterality: N/A;   CARDIOVERSION N/A 12/20/2022   Procedure: CARDIOVERSION;  Surgeon: [name], MD;  Location: [facility] INVASIVE CV LAB;  Service: Cardiovascular;  Laterality: N/A;   CYSTOSCOPY WITH STENT PLACEMENT Bilateral 03/07/2019   Procedure: CYSTOSCOPY WITH BILATERAL FIREFLY INJECTION;  Surgeon: Crist Fat, MD;  Location: [facility] ORS;  Service: Urology;  Laterality: Bilateral;   GASTROSTOMY TUBE PLACEMENT  07/04/2004   IR - G tube for tonsilar cancer   IR ANGIO INTRA EXTRACRAN SEL COM CAROTID INNOMINATE UNI R MOD SED  03/09/2019   IR ANGIO VERTEBRAL SEL VERTEBRAL UNI R MOD SED  03/09/2019   IR CT HEAD LTD  03/09/2019   IR PERCUTANEOUS ART THROMBECTOMY/INFUSION INTRACRANIAL INC DIAG ANGIO  03/09/2019   IR RADIOLOGIST EVAL & MGMT  12/18/2018   LAMINECTOMY     C5/placement of steel plate   LOOP RECORDER INSERTION N/A 06/19/2023   Procedure: LOOP RECORDER INSERTION;  Surgeon: Lanier Prude, MD;  Location: [facility] INVASIVE CV LAB;  Service: Cardiovascular;  Laterality: N/A;   NECK SURGERY  2003   replaced disk   RADIOLOGY WITH ANESTHESIA N/A 03/08/2019   Procedure: IR WITH ANESTHESIA;  Surgeon: Julieanne Cotton, MD;  Location: [facility] OR;  Service: Radiology;  Laterality: N/A;   TEE WITHOUT CARDIOVERSION N/A 10/15/2015   Procedure: TRANSESOPHAGEAL ECHOCARDIOGRAM (TEE);  Surgeon: Laurey Morale, MD;  Location: [facility] ENDOSCOPY;  Service: Cardiovascular;  Laterality: N/A;   TEE WITHOUT CARDIOVERSION N/A 11/24/2015   Procedure: TRANSESOPHAGEAL ECHOCARDIOGRAM (TEE);  Surgeon: Wendall Stade, MD;   Location: [facility] ENDOSCOPY;  Service: Cardiovascular;  Laterality: N/A;   TEE WITHOUT CARDIOVERSION N/A 12/20/2022   Procedure: TRANSESOPHAGEAL ECHOCARDIOGRAM;  Surgeon: [name], MD;  Location: [facility] INVASIVE CV LAB;  Service: Cardiovascular;  Laterality: N/A;   XI ROBOTIC ASSISTED COLOSTOMY TAKEDOWN N/A 03/07/2019   Procedure: XI ROBOTIC ASSISTED LOW ANTERIOR RESECTION, RIGID PROCTOSCOPY;  Surgeon: Karie Soda, MD;  Location: [facility] ORS;  Service: General;  Laterality: N/A;   Patient Active Problem List   Diagnosis Date Noted   Carotid occlusion, left 10/23/2023   ICH (intracerebral hemorrhage) (HCC) 08/11/2023   Carotid artery stenosis    Atrial flutter (HCC) 12/19/2022   Essential hypertension 04/22/2021   Hypokalemia 03/12/2019   Expressive aphasia 03/10/2019   Dysphagia 03/10/2019   Middle cerebral artery embolism, left 03/09/2019   Diverticular stricture (HCC) 03/07/2019   Bacteria in urine    Chronic atrial fibrillation (HCC) 12/02/2018   Current use of long term anticoagulation 12/02/2018   UTI (urinary tract infection) 11/29/2018   Hyponatremia 11/29/2018   PAF (paroxysmal atrial fibrillation) (HCC) 11/29/2018   Diverticulitis of large intestine with abscess 09/23/2018   Dyslipidemia 12/05/2017   CAD (coronary artery disease), native coronary artery 12/06/2015   SOB (shortness of breath)    NICM (nonischemic cardiomyopathy) (HCC)    Thyroid activity decreased    Hypothyroidism 08/12/2013   History of radiation therapy 05/30/2012   Smokes tobacco daily 05/30/2012   Cancer of tonsillar fossa (HCC) 11/27/2011   Tonsil cancer (HCC) 2005   History of chemotherapy 2005    ONSET DATE: 08/11/23  REFERRING DIAG: I61.0 (ICD-10-CM) - Nontraumatic subcortical hemorrhage of left cerebral hemisphere (HCC)   THERAPY DIAG:  Muscle weakness (generalized)  Unsteadiness on feet  Other lack of coordination  Other abnormalities of gait and mobility  Frontal lobe and executive  function deficit  Attention and concentration deficit  Rationale for Evaluation and Treatment: Rehabilitation  SUBJECTIVE:   SUBJECTIVE STATEMENT: Pt reports exercising at home  Pt accompanied by: self  PERTINENT HISTORY:   68 yo male hospitalized 08/11/23 with R sided weakness. CT showed a large basal ganglia ICH with IVH extension. PMH includes:  A-fib, carotid artery stenosis, CAD, tonsil cancer, hypothyroidism, L MCA CVA.    PRECAUTIONS: Fall  WEIGHT BEARING RESTRICTIONS: No  PAIN:  Are you having pain? No  FALLS: Has patient fallen in last 6 months? No  LIVING ENVIRONMENT: Lives with: lives alone has friends and ex wife checks in on him Lives in: House/apartment Stairs: no Has following equipment at home: Single point cane and Tour manager  PLOF: Independent  PATIENT GOALS: improve use of RUE  OBJECTIVE:  Note: Objective measures were completed at Evaluation unless otherwise noted.  HAND DOMINANCE: Right  ADLs: Overall ADLs: mod I with all basic ADLS Transfers/ambulation related to ADLs: Eating: uses RUE 20%, currently using non dominant LUE,  Grooming: using non dominant LUE UB Dressing: mod I difficulty with buttons LB Dressing: mod  I Toileting: mod I with cane Bathing: has tub bench Tub Shower transfers: mod I  Equipment: Transfer tub bench  IADLs: Shopping: needs assist for transportation Light housekeeping: Pt is performing light cleaning, he has occasional assist Meal Prep: microwave meals primarily, does a little stove top Medication management: pt handles  Financial management: pt handles Handwriting: 90% legibility, handwriting is small   MOBILITY STATUS:  mod I with cane  ACTIVITY TOLERANCE: Activity tolerance: standing  UPPER EXTREMITY ROM:  grossly WFLS  UPPER EXTREMITY MMT:     MMT Right eval Left eval  Shoulder flexion 3+/5 4+/5  Shoulder abduction    Shoulder adduction    Shoulder extension    Shoulder internal rotation     Shoulder external rotation    Middle trapezius    Lower trapezius    Elbow flexion 4-/5 4-/5  Elbow extension 4-/5 4-/5  Wrist flexion    Wrist extension    Wrist ulnar deviation    Wrist radial deviation    Wrist pronation    Wrist supination    (Blank rows = not tested)  HAND FUNCTION: Grip strength: Right: 50 lbs; Left: 63 lbs  COORDINATION: 9 Hole Peg test: Right: 59.69 sec; Left: 29.35 sec  SENSATION:diminished in RUE  COGNITION: Overall cognitive status:unable to spell WORLD backwards correctly, recalls 2/3 words following a delay.  VISION ASSESSMENT: Not tested- denies visual changes   OBSERVATIONS: Pleasant gentleman motivated to improve                                                                                                                              TREATMENT DATE:11/21/23- Pt practiced placing screwing bolts into pegboard with RUE then using tools such as screw driver, allen wrench and wrench to tighten items with RUE min-mod v.c, pt also practiced locking and unlocking a pad lock. Pt with mod difficulty using wrench, min difficulty for using screw driver. UBE x5 mins level 1 for conditioning  11/19/23 Closed chain shoulder flexion with foam roll 2 sets of 10 reps min v.c and facilitation for shoulder positioning and focus on control. Reviewed yellow theraband exercises 15 reps each bilateral UE's. Pt performed biceps  curls with bilateral UE's simultaneously with cues to have RUE mirror movements of LUE, min v.c Placing grooved pegs into pegboard with RUE for increased fine motor coordination, min difficulty/ v.c  Removing pegs with tweezers, for sustained pinch for 1/2 the items,  mod difficulty/ drops   11/15/23 UBE x 6 mins level 1 for conditioning closed chain shoulder flexion with medium ball x 15 reps then with foam roll for send set min v.c for control. Yellow theraband for biceps curls and triceps extension, 10-15 reps each UE, min v.c copying  small peg design with RUE min-mod difficulty/ drops, min v.c to avoid compensation.  11/12/23- Reviewed cane HEP 10-15 reps min v.c Functional reaching to place and retrieve clothespins(1-8#) from overhead target(vertical yardstick) with RUE for functional  reach and sustained pinch, min difficulty, v.c Placing metal pegs( 3 sizes) into pegboard with RUE then removing with in hand manipulation, min difficulty/ v.c  Flipping and dealing cards with RUE for increased fine motor coordination, min v.c   11/09/23: Pt instructed in cane HEP.  Pt fatigued quickly and with difficulty with control, but responded well to cueing.  Also reviewed and added to coordination HEP.  Pt able to copy shopping list with good legibility, but several spelling errors--pt reports difficulty with spelling if generating a list at home.   10/31/23- eval     PATIENT EDUCATION: Education details:   yellow theraband and shoulder flexion with paper towel roll review Person educated: Patient Education method: Explanation, Demonstration, Tactile cues, Verbal cues, and Handouts Education comprehension: verbalized understanding, returned demonstration, and verbal cues required  HOME EXERCISE PROGRAM: beginning coordination 3/26 11/09/23  Cane HEP 4/10- biceps triceps with yellow band, shoulder flexion with paper towel roll  GOALS: Goals reviewed with patient? Yes  SHORT TERM GOALS: Target date: 12/01/23  I with inital HEP Goal status: met for cane  2.  Pt will report feeding himself at least 50% of the time with RUE. Baseline: uses 20% x Goal status:  ongoing  3.  Pt will demonstrate improved RUE fine motor coordination for ADLs as evidenced by decreasing 9 hole peg test score to 52 secs or less Goal status: ongoing  4.  Pt will report consistently brushing his teeth with RUE Goal status: ongoing  5.  I with memory compensation strategies Goal status: ongoing  6.  Pt will report that he has resumed moderate level  home management mod  Goal status: ongoing   LONG TERM GOALS: Target date: 01/23/24  I with updated HEP Goal status: INITIAL  2.  Pt will demonstrate improved RUE fine motor coordination for ADLs as evidenced by decreasing 9 hole peg test score to 48 secs or less Goal status: INITIAL  3.  Pt will demonstrate ability to carry a plate in RUE without spills. Goal status: INITIAL  4.  Pt will write a short paragraph with 100% legibility and minimal decrease in letter size. Goal status: INITIAL  5.  Pt will resume use of RUE as dominant hand at least 75% of the time for ADLs/ IADLs. Goal status: INITIAL  6. Pt will retrieve a 3 lbs object from eye level shelf using RUE with good control  Goal status: inital  ASSESSMENT:  CLINICAL IMPRESSION: Pt is progressing towards goals. Pt demonstrates improving functional use of UE's. fine motor coodination tasks requiring use of tools was challenging however pt demonstrates improvements with repetition and practice. PERFORMANCE DEFICITS: in functional skills including ADLs, IADLs, coordination, dexterity, sensation, strength, flexibility, Fine motor control, Gross motor control, mobility, balance, endurance, decreased knowledge of precautions, decreased knowledge of use of DME, and UE functional use, cognitive skills including attention, memory, problem solving, safety awareness, and thought, and psychosocial skills including coping strategies, environmental adaptation, habits, interpersonal interactions, and routines and behaviors.   IMPAIRMENTS: are limiting patient from ADLs, IADLs, play, leisure, and social participation.   CO-MORBIDITIES: may have co-morbidities  that affects occupational performance. Patient will benefit from skilled OT to address above impairments and improve overall function.  MODIFICATION OR ASSISTANCE TO COMPLETE EVALUATION: No modification of tasks or assist necessary to complete an evaluation.  OT OCCUPATIONAL PROFILE  AND HISTORY: Detailed assessment: Review of records and additional review of physical, cognitive, psychosocial history related to current functional performance.  CLINICAL DECISION  MAKING: LOW - limited treatment options, no task modification necessary  REHAB POTENTIAL: Good  EVALUATION COMPLEXITY: Low    PLAN:  OT FREQUENCY: 2x/week  OT DURATION: 12 weeks  PLANNED INTERVENTIONS: 97168 OT Re-evaluation, 97535 self care/ADL training, 97110 therapeutic exercise, 97530 therapeutic activity, 97112 neuromuscular re-education, 97140 manual therapy, 97113 aquatic therapy, 97035 ultrasound, 97018 paraffin, 97010 moist heat, 97010 cryotherapy, 97034 contrast bath, 97014 electrical stimulation unattended, 97129 Cognitive training (first 15 min), 97130 Cognitive training(each additional 15 min), 97760 Orthotics management and training, 97760 Splinting (initial encounter), passive range of motion, balance training, functional mobility training, energy conservation, coping strategies training, patient/family education, and DME and/or AE instructions  RECOMMENDED OTHER SERVICES: n/a  CONSULTED AND AGREED WITH PLAN OF CARE: Patient  PLAN FOR NEXT SESSION:  reveiw yellow theraband HEP,  RUE functional use.   Kinsley Nicklaus, OTR/L 11/21/2023, 11:02 AM

## 2023-11-21 NOTE — Therapy (Signed)
 OUTPATIENT SPEECH LANGUAGE PATHOLOGY TREATMENT   Patient Name: SAMUELL KNOBLE MRN: [id number] DOB:1956/02/12, 68 y.o., male Today's Date: 11/21/2023  PCP: NA REFERRING PROVIDER: Genetta Kenning, MD  END OF SESSION:  End of Session - 11/21/23 1021     Visit Number 5    Number of Visits 17    Date for SLP Re-Evaluation 01/07/24    SLP Start Time 1015    SLP Stop Time  1055    SLP Time Calculation (min) 40 min    Activity Tolerance Patient tolerated treatment well             Past Medical History:  Diagnosis Date   AF (paroxysmal atrial fibrillation) (HCC) 11/29/2018   Atrial flutter (HCC) 10/12/2015   a. TEE 3/17 with ? LAA clot-->s/p TEE/DCCV 11/24/2015; s/p DCCV 12/2022;s/p ablation 06/2023   CAD (coronary artery disease), native coronary artery 12/06/2015   a. 10/2015 MV: EF  37%, reversible defect inferior apex, intermediate risk findings; b. 10/2015 Cath: 20% mid RCA.   Carotid artery stenosis    1-39% right ICA stenosis and occluded left ICA   Colonic diverticular abscess    Diverticulitis    History of chemotherapy 2005   Cisplatin   Hypothyroidism    NICM (nonischemic cardiomyopathy) (HCC) 10/14/2015   a. Tachy mediated?;  b. Echo 3/17 - Mild concentric LVH, EF 30-35%, anteroseptal, anterior, anterolateral, apical anterior, lateral hypokinesis, trivial MR, mild to moderately reduced RVSF; c. LHC 3/17 - mRCA 20%   Radiation NOv.3,2005-Dec. 15, 2005   6810 cGy in 30 fractions   Tonsil cancer [facility]) 2005   Dr Ava Lei.  XRT   Past Surgical History:  Procedure Laterality Date   A-FLUTTER ABLATION N/A 06/19/2023   Procedure: A-FLUTTER ABLATION;  Surgeon: Boyce Byes, MD;  Location: [facility] INVASIVE CV LAB;  Service: Cardiovascular;  Laterality: N/A;   APPENDECTOMY N/A 03/07/2019   Procedure: ROBOTIC ASSISTED APPENDECTOMY;  Surgeon: Candyce Champagne, MD;  Location: [facility] ORS;  Service: General;  Laterality: N/A;   CARDIAC CATHETERIZATION N/A 10/18/2015   Procedure:  Left Heart Cath and Coronary Angiography;  Surgeon: Odie Benne, MD;  Location: [facility] INVASIVE CV LAB;  Service: Cardiovascular;  Laterality: N/A;   CARDIOVERSION N/A 11/24/2015   Procedure: CARDIOVERSION;  Surgeon: Loyde Rule, MD;  Location: [facility] ENDOSCOPY;  Service: Cardiovascular;  Laterality: N/A;   CARDIOVERSION N/A 12/20/2022   Procedure: CARDIOVERSION;  Surgeon: Bridgette Campus, MD;  Location: [facility] INVASIVE CV LAB;  Service: Cardiovascular;  Laterality: N/A;   CYSTOSCOPY WITH STENT PLACEMENT Bilateral 03/07/2019   Procedure: CYSTOSCOPY WITH BILATERAL FIREFLY INJECTION;  Surgeon: Andrez Banker, MD;  Location: [facility] ORS;  Service: Urology;  Laterality: Bilateral;   GASTROSTOMY TUBE PLACEMENT  07/04/2004   IR - G tube for tonsilar cancer   IR ANGIO INTRA EXTRACRAN SEL COM CAROTID INNOMINATE UNI R MOD SED  03/09/2019   IR ANGIO VERTEBRAL SEL VERTEBRAL UNI R MOD SED  03/09/2019   IR CT HEAD LTD  03/09/2019   IR PERCUTANEOUS ART THROMBECTOMY/INFUSION INTRACRANIAL INC DIAG ANGIO  03/09/2019   IR RADIOLOGIST EVAL & MGMT  12/18/2018   LAMINECTOMY     C5/placement of steel plate   LOOP RECORDER INSERTION N/A 06/19/2023   Procedure: LOOP RECORDER INSERTION;  Surgeon: Boyce Byes, MD;  Location: [facility] INVASIVE CV LAB;  Service: Cardiovascular;  Laterality: N/A;   NECK SURGERY  2003   replaced disk   RADIOLOGY WITH ANESTHESIA N/A 03/08/2019   Procedure: IR  WITH ANESTHESIA;  Surgeon: Luellen Sages, MD;  Location: [facility] OR;  Service: Radiology;  Laterality: N/A;   TEE WITHOUT CARDIOVERSION N/A 10/15/2015   Procedure: TRANSESOPHAGEAL ECHOCARDIOGRAM (TEE);  Surgeon: Darlis Eisenmenger, MD;  Location: [facility] ENDOSCOPY;  Service: Cardiovascular;  Laterality: N/A;   TEE WITHOUT CARDIOVERSION N/A 11/24/2015   Procedure: TRANSESOPHAGEAL ECHOCARDIOGRAM (TEE);  Surgeon: Loyde Rule, MD;  Location: [facility] ENDOSCOPY;  Service: Cardiovascular;  Laterality: N/A;   TEE WITHOUT CARDIOVERSION N/A 12/20/2022   Procedure:  TRANSESOPHAGEAL ECHOCARDIOGRAM;  Surgeon: Bridgette Campus, MD;  Location: [facility] INVASIVE CV LAB;  Service: Cardiovascular;  Laterality: N/A;   XI ROBOTIC ASSISTED COLOSTOMY TAKEDOWN N/A 03/07/2019   Procedure: XI ROBOTIC ASSISTED LOW ANTERIOR RESECTION, RIGID PROCTOSCOPY;  Surgeon: Candyce Champagne, MD;  Location: [facility] ORS;  Service: General;  Laterality: N/A;   Patient Active Problem List   Diagnosis Date Noted   Carotid occlusion, left 10/23/2023   ICH (intracerebral hemorrhage) (HCC) 08/11/2023   Carotid artery stenosis    Atrial flutter (HCC) 12/19/2022   Essential hypertension 04/22/2021   Hypokalemia 03/12/2019   Expressive aphasia 03/10/2019   Dysphagia 03/10/2019   Middle cerebral artery embolism, left 03/09/2019   Diverticular stricture (HCC) 03/07/2019   Bacteria in urine    Chronic atrial fibrillation (HCC) 12/02/2018   Current use of long term anticoagulation 12/02/2018   UTI (urinary tract infection) 11/29/2018   Hyponatremia 11/29/2018   PAF (paroxysmal atrial fibrillation) (HCC) 11/29/2018   Diverticulitis of large intestine with abscess 09/23/2018   Dyslipidemia 12/05/2017   CAD (coronary artery disease), native coronary artery 12/06/2015   SOB (shortness of breath)    NICM (nonischemic cardiomyopathy) (HCC)    Thyroid  activity decreased    Hypothyroidism 08/12/2013   History of radiation therapy 05/30/2012   Smokes tobacco daily 05/30/2012   Cancer of tonsillar fossa (HCC) 11/27/2011   Tonsil cancer (HCC) 2005   History of chemotherapy 2005    ONSET DATE: Referred on 10/26/23 (CVA in Jan)   REFERRING DIAG: I61.0 (ICD-10-CM) - Nontraumatic subcortical hemorrhage of left cerebral hemisphere (HCC)   THERAPY DIAG:  Cognitive communication deficit  Dysarthria and anarthria  Rationale for Evaluation and Treatment: Rehabilitation  SUBJECTIVE:   SUBJECTIVE STATEMENT: Pt reports feeling discouraged about the overall amount progress he has made (physically)  Pt  accompanied by: self  PERTINENT HISTORY: Per chart review: 68 y.o. male with history of atrial fibrillation, on Eliquis , carotid artery stenosis, hypothyroidism , CVA 1/25  PAIN:  Are you having pain? No  FALLS: Has patient fallen in last 6 months?  No  LIVING ENVIRONMENT: Lives with: lives alone Lives in: House/apartment Ex Wife will be driving to appointments (or friends) Sheryle Donning  PLOF:  Level of assistance: Independent with ADLs, Independent with IADLs Employment: Retired; [employer]   PATIENT GOALS: memory, word finding, speech   OBJECTIVE:  Note: Objective measures were completed at Evaluation unless otherwise noted.  DIAGNOSTIC FINDINGS: Per Chart Review  CT HEAD CODE STROKE WO CONTRAST 08/11/23  IMPRESSION: 1.8 x 2.0 x 1.1 cm parenchymal hemorrhage centered in the left basal ganglia with intraventricular extension. No evidence of hydrocephalus at the time of exam. No significant midline shift.   Findings were paged to Dr. Murvin Arthurs on 08/11/23 at 2:29 PM     Electronically Signed   By: Clora Dane M.D.   On: 08/11/2023 14:31  COGNITION: Overall cognitive status: Impaired Areas of impairment:  Attention: Impaired: Selective, Alternating, Divided Memory: Impaired: Short term Prospective Executive function: Impaired: Problem  solving, Organization, Planning, and Slow processing Functional deficits: Reports difficulty with recall of important information  AUDITORY COMPREHENSION: Overall auditory comprehension: Appears intact YES/NO questions: Appears intact Following directions: Appears intact Conversation: Complex Interfering components: attention and processing speed Effective technique: extra processing time, pausing, and repetition/stressing words  READING COMPREHENSION: Intact  EXPRESSION: verbal  VERBAL EXPRESSION: Level of generative/spontaneous verbalization: conversation  Comments: Anomia  Non-verbal means of communication: N/A  WRITTEN  EXPRESSION: Dominant hand: right Written expression: Not tested  MOTOR SPEECH: Overall motor speech: impaired Level of impairment: Conversation Respiration:  WFL Phonation: normal Resonance: WFL Articulation: Appears intact; pt reports slurred speech when feeling tired Intelligibility: Intelligible; 100% intelligible at eval; reports worsening speech when fatigued Motor planning: Appears intact Motor speech errors:  NA Interfering components:  NA Effective technique: slow rate and over articulate  ORAL MOTOR EXAMINATION: Overall status: Impaired:   Labial: Right (Symmetry) Facial: Right (Symmetry) *ROM is good. At rest, facial droop on R side. Comments: NA   STANDARDIZED ASSESSMENTS: Initiated CLQT - to complete next session    Cognitive Linguistic Quick Test: AGE - 18 - 69   The Cognitive Linguistic Quick Test (CLQT) was administered to assess the relative status of five cognitive domains: attention, memory, language, executive functioning, and visuospatial skills. Scores from 10 tasks were used to estimate severity ratings (standardized for age groups 18-69 years and 70-89 years) for each domain, a clock drawing task, as well as an overall composite severity rating of cognition.       Task Score Criterion Cut Scores  Personal Facts 7/8 8  Symbol Cancellation 11/12 11  Confrontation Naming 10/10 10  Clock Drawing  11/13 12  Story Retelling 6/10 6  Symbol Trails 10/10 9  Generative Naming 3/9 5  Design Memory 6/6 5  Mazes  8/8 7  Design Generation 5/13 6     Cognitive Domain Composite Score Severity Rating  Attention 190/215 WNL  Memory 148/185 Mild  Executive Function 26/40 WNL  Language 26/37 Mild  Visuospatial Skills 95/105 WNL  Clock Drawing  11/13 Mild  Composite Severity Rating  WNL           PATIENT REPORTED OUTCOME MEASURES (PROM): Neuro QOL - Cognitive Short Form: 29                                                                                                                              TREATMENT DATE:   11/20/23: Pt was seen for skilled ST services targeting cognitive-communication and word finding strategies. Pt completed Cognitive PROMs measure - see above in blue. Pt reports he hasn't had any significant instances of anomia - given a 2-3 second delay, word typically pops out. Pt reports he is to go golfing (on the putting green) with a friend for his birthday. We discussed word finding strategies he could use in conversation. SLP facilitated word finding practice using multiple meaning words. Pt was tasked with finding the synonym (or  a phrase close) for the given word. SLP challenged the patient to find synonyms to different golf terms in preparation for his outing. SLP briefly educated on "Be Clear" for dysarthria. To continue next session.   11/15/23: Pt was seen for skilled ST services targeting cognitive-communication and word finding strategies. SLP initiated education on word finding. SLP provided examples of each strategies and how to use in conversation. Pt reported he feels he would like to the following word finding strategies re: delay, describe, association, look it up. Pt was noted to have errors when reading aloud which was impacting his reading comprehension. Pt was noted to delete sounds from words  and add functor words while reading. SLP trialed re: pointing to each word with pen, re-reading aloud, re-reading silently to remove expressive component. To demonstrate "describe" strategy through Semantic Feature Analysis (SFA) next session.   11/09/23: Pt was seen for skilled ST services targeting completed cognitive-communication testing and education. SLP reviewed CLQT results with patient. He had no further questions. SLP provided pt with cognitive-communication handout. Pt reported difficulty with re: recalling details in conversations, understanding TV shows, difficulty with expressing thoughts, and reading/writing.  Pt has  residual aphasia from previous stroke. To provide strategies in this POC.   11/07/23: Provided brief education on cognitive-communication disorder and speech intelligibility.   PATIENT EDUCATION: Education details: SLP role in cognitive-communication impairments Person educated: Patient Education method: Explanation Education comprehension: needs further education   GOALS: Goals reviewed with patient? No; to update after further evaluation  SHORT TERM GOALS: Target date: 12/08/23  Continue Cognitive testing to determine needs Baseline: Goal status: MET  2.  Complete PROMs measure Baseline:  Goal status: INITIAL  3.  Pt will recall 3 word finding strategies to use in instances of anomia with independently. Baseline:  Goal status: INITIAL  4.  Pt will verbalize 2 attention strategies to improve concentration during functional tasks.  Baseline:  Goal status: INITIAL  5. Pt will verbalize 2 memory strategies for recall of important information.  Baseline:  Goal status: INITIAL    LONG TERM GOALS: Target date: 01/08/24  Pt will improve score on PROMs measure Baseline:  Goal status: INITIAL  2.  Pt will report successful use of word finding strategies at home and in the community. Baseline:  Goal status: INITIAL  3.  Pt will report successful use of attention strategies at home and in the community. Baseline:  Goal status: INITIAL  4.  Pt will report successful use of memory strategies at home and in the community.  Baseline:  Goal status: INITIAL   ASSESSMENT:  CLINICAL IMPRESSION: Pt is a 68 yo male who presents to ST OP for evaluation post recent CVA in Jan. Pt lives alone and will be driven to appointments by ex-wife or friends. Pt endorses speech changes, in addition to sx associated with cognitive-communication challenges [id number] of new information, word finding, and slowed processing.  Pt was assessed using CLQT - to complete next session. SLP observed  difficulty with short term memory, instances of anomia, and very mild dysarthria. Pt was 100% intelligible in quiet, therapy room to an unfamiliar listener. He reports speech is worse when feeling tired. SLP to provide strategies for pt use in moments where speech may be unclear. Pt reports no difficulty with swallowing at this time and is tolerating a regular/thin diet. SLP rec skilled ST services to address cognitive-communication impairment and dysarthria to maximize functional independence and increase QOL.     OBJECTIVE IMPAIRMENTS: include  memory, executive functioning, expressive language, and dysarthria. These impairments are limiting patient from effectively communicating at home and in community. Factors affecting potential to achieve goals and functional outcome are  NA . Patient will benefit from skilled SLP services to address above impairments and improve overall function.  REHAB POTENTIAL: Good  PLAN:  SLP FREQUENCY: 1-2x/week  SLP DURATION: 8 weeks  PLANNED INTERVENTIONS: Environmental controls, Cueing hierachy, Internal/external aids, Functional tasks, SLP instruction and feedback, Compensatory strategies, Patient/family education, and [id number] Treatment of speech (30 or 45 min)     [employer], CCC-SLP 11/21/2023, 10:21 AM

## 2023-11-22 ENCOUNTER — Ambulatory Visit: Admitting: Occupational Therapy

## 2023-11-22 ENCOUNTER — Ambulatory Visit: Admitting: Speech Pathology

## 2023-11-22 ENCOUNTER — Ambulatory Visit: Admitting: Physical Therapy

## 2023-11-26 ENCOUNTER — Ambulatory Visit

## 2023-11-26 ENCOUNTER — Other Ambulatory Visit: Payer: Self-pay

## 2023-11-26 ENCOUNTER — Ambulatory Visit: Admitting: Speech Pathology

## 2023-11-26 ENCOUNTER — Ambulatory Visit: Admitting: Occupational Therapy

## 2023-11-26 ENCOUNTER — Encounter: Payer: Self-pay | Admitting: Speech Pathology

## 2023-11-26 DIAGNOSIS — R2681 Unsteadiness on feet: Secondary | ICD-10-CM

## 2023-11-26 DIAGNOSIS — M6281 Muscle weakness (generalized): Secondary | ICD-10-CM

## 2023-11-26 DIAGNOSIS — R2689 Other abnormalities of gait and mobility: Secondary | ICD-10-CM

## 2023-11-26 DIAGNOSIS — R278 Other lack of coordination: Secondary | ICD-10-CM

## 2023-11-26 DIAGNOSIS — R41844 Frontal lobe and executive function deficit: Secondary | ICD-10-CM

## 2023-11-26 DIAGNOSIS — R471 Dysarthria and anarthria: Secondary | ICD-10-CM

## 2023-11-26 DIAGNOSIS — R41840 Attention and concentration deficit: Secondary | ICD-10-CM

## 2023-11-26 DIAGNOSIS — R41841 Cognitive communication deficit: Secondary | ICD-10-CM

## 2023-11-26 NOTE — Therapy (Signed)
 OUTPATIENT OCCUPATIONAL THERAPY NEURO TREATMENT  Patient Name: Adrian [name] MRN: [id number] DOB:November 28, 1955, 68 y.o., male Today's Date: 11/26/2023  PCP: Dr. Darren Em REFERRING PROVIDER: Dr. Sharl Davies  END OF SESSION:  OT End of Session - 11/26/23 1257     Visit Number 7    Number of Visits 25    Authorization Type Medicare    Authorization - Visit Number 7    Progress Note Due on Visit 10    OT Start Time 0934    OT Stop Time 1014    OT Time Calculation (min) 40 min                 Past Medical History:  Diagnosis Date   AF (paroxysmal atrial fibrillation) (HCC) 11/29/2018   Atrial flutter (HCC) 10/12/2015   a. TEE 3/17 with ? LAA clot-->s/p TEE/DCCV 11/24/2015; s/p DCCV 12/2022;s/p ablation 06/2023   CAD (coronary artery disease), native coronary artery 12/06/2015   a. 10/2015 MV: EF  37%, reversible defect inferior apex, intermediate risk findings; b. 10/2015 Cath: 20% mid RCA.   Carotid artery stenosis    1-39% right ICA stenosis and occluded left ICA   Colonic diverticular abscess    Diverticulitis    History of chemotherapy 2005   Cisplatin   Hypothyroidism    NICM (nonischemic cardiomyopathy) (HCC) 10/14/2015   a. Tachy mediated?;  b. Echo 3/17 - Mild concentric LVH, EF 30-35%, anteroseptal, anterior, anterolateral, apical anterior, lateral hypokinesis, trivial MR, mild to moderately reduced RVSF; c. LHC 3/17 - mRCA 20%   Radiation NOv.3,2005-Dec. 15, 2005   6810 cGy in 30 fractions   Tonsil cancer [facility]) 2005   Dr Ava Lei.  XRT   Past Surgical History:  Procedure Laterality Date   A-FLUTTER ABLATION N/A 06/19/2023   Procedure: A-FLUTTER ABLATION;  Surgeon: Boyce Byes, MD;  Location: [facility] INVASIVE CV LAB;  Service: Cardiovascular;  Laterality: N/A;   APPENDECTOMY N/A 03/07/2019   Procedure: ROBOTIC ASSISTED APPENDECTOMY;  Surgeon: Candyce Champagne, MD;  Location: [facility] ORS;  Service: General;  Laterality: N/A;   CARDIAC CATHETERIZATION N/A 10/18/2015    Procedure: Left Heart Cath and Coronary Angiography;  Surgeon: Odie Benne, MD;  Location: [facility] INVASIVE CV LAB;  Service: Cardiovascular;  Laterality: N/A;   CARDIOVERSION N/A 11/24/2015   Procedure: CARDIOVERSION;  Surgeon: Loyde Rule, MD;  Location: [facility] ENDOSCOPY;  Service: Cardiovascular;  Laterality: N/A;   CARDIOVERSION N/A 12/20/2022   Procedure: CARDIOVERSION;  Surgeon: Bridgette Campus, MD;  Location: [facility] INVASIVE CV LAB;  Service: Cardiovascular;  Laterality: N/A;   CYSTOSCOPY WITH STENT PLACEMENT Bilateral 03/07/2019   Procedure: CYSTOSCOPY WITH BILATERAL FIREFLY INJECTION;  Surgeon: Andrez Banker, MD;  Location: [facility] ORS;  Service: Urology;  Laterality: Bilateral;   GASTROSTOMY TUBE PLACEMENT  07/04/2004   IR - G tube for tonsilar cancer   IR ANGIO INTRA EXTRACRAN SEL COM CAROTID INNOMINATE UNI R MOD SED  03/09/2019   IR ANGIO VERTEBRAL SEL VERTEBRAL UNI R MOD SED  03/09/2019   IR CT HEAD LTD  03/09/2019   IR PERCUTANEOUS ART THROMBECTOMY/INFUSION INTRACRANIAL INC DIAG ANGIO  03/09/2019   IR RADIOLOGIST EVAL & MGMT  12/18/2018   LAMINECTOMY     C5/placement of steel plate   LOOP RECORDER INSERTION N/A 06/19/2023   Procedure: LOOP RECORDER INSERTION;  Surgeon: Boyce Byes, MD;  Location: [facility] INVASIVE CV LAB;  Service: Cardiovascular;  Laterality: N/A;   NECK SURGERY  2003   replaced disk   RADIOLOGY  WITH ANESTHESIA N/A 03/08/2019   Procedure: IR WITH ANESTHESIA;  Surgeon: Luellen Sages, MD;  Location: [facility] OR;  Service: Radiology;  Laterality: N/A;   TEE WITHOUT CARDIOVERSION N/A 10/15/2015   Procedure: TRANSESOPHAGEAL ECHOCARDIOGRAM (TEE);  Surgeon: Darlis Eisenmenger, MD;  Location: [facility] ENDOSCOPY;  Service: Cardiovascular;  Laterality: N/A;   TEE WITHOUT CARDIOVERSION N/A 11/24/2015   Procedure: TRANSESOPHAGEAL ECHOCARDIOGRAM (TEE);  Surgeon: Loyde Rule, MD;  Location: [facility] ENDOSCOPY;  Service: Cardiovascular;  Laterality: N/A;   TEE WITHOUT CARDIOVERSION N/A 12/20/2022    Procedure: TRANSESOPHAGEAL ECHOCARDIOGRAM;  Surgeon: Bridgette Campus, MD;  Location: [facility] INVASIVE CV LAB;  Service: Cardiovascular;  Laterality: N/A;   XI ROBOTIC ASSISTED COLOSTOMY TAKEDOWN N/A 03/07/2019   Procedure: XI ROBOTIC ASSISTED LOW ANTERIOR RESECTION, RIGID PROCTOSCOPY;  Surgeon: Candyce Champagne, MD;  Location: [facility] ORS;  Service: General;  Laterality: N/A;   Patient Active Problem List   Diagnosis Date Noted   Carotid occlusion, left 10/23/2023   ICH (intracerebral hemorrhage) (HCC) 08/11/2023   Carotid artery stenosis    Atrial flutter (HCC) 12/19/2022   Essential hypertension 04/22/2021   Hypokalemia 03/12/2019   Expressive aphasia 03/10/2019   Dysphagia 03/10/2019   Middle cerebral artery embolism, left 03/09/2019   Diverticular stricture (HCC) 03/07/2019   Bacteria in urine    Chronic atrial fibrillation (HCC) 12/02/2018   Current use of long term anticoagulation 12/02/2018   UTI (urinary tract infection) 11/29/2018   Hyponatremia 11/29/2018   PAF (paroxysmal atrial fibrillation) (HCC) 11/29/2018   Diverticulitis of large intestine with abscess 09/23/2018   Dyslipidemia 12/05/2017   CAD (coronary artery disease), native coronary artery 12/06/2015   SOB (shortness of breath)    NICM (nonischemic cardiomyopathy) (HCC)    Thyroid  activity decreased    Hypothyroidism 08/12/2013   History of radiation therapy 05/30/2012   Smokes tobacco daily 05/30/2012   Cancer of tonsillar fossa (HCC) 11/27/2011   Tonsil cancer (HCC) 2005   History of chemotherapy 2005    ONSET DATE: 08/11/23  REFERRING DIAG: I61.0 (ICD-10-CM) - Nontraumatic subcortical hemorrhage of left cerebral hemisphere (HCC)   THERAPY DIAG:  Other lack of coordination  Other abnormalities of gait and mobility  Frontal lobe and executive function deficit  Unsteadiness on feet  Attention and concentration deficit  Muscle weakness (generalized)  Rationale for Evaluation and Treatment:  Rehabilitation  SUBJECTIVE:   SUBJECTIVE STATEMENT: Pt  denies pain  Pt accompanied by: self  PERTINENT HISTORY:   68 yo male hospitalized 08/11/23 with R sided weakness. CT showed a large basal ganglia ICH with IVH extension. PMH includes:  A-fib, carotid artery stenosis, CAD, tonsil cancer, hypothyroidism, L MCA CVA.    PRECAUTIONS: Fall  WEIGHT BEARING RESTRICTIONS: No  PAIN:  Are you having pain? No  FALLS: Has patient fallen in last 6 months? No  LIVING ENVIRONMENT: Lives with: lives alone has friends and ex wife checks in on him Lives in: House/apartment Stairs: no Has following equipment at home: Single point cane and Tour manager  PLOF: Independent  PATIENT GOALS: improve use of RUE  OBJECTIVE:  Note: Objective measures were completed at Evaluation unless otherwise noted.  HAND DOMINANCE: Right  ADLs: Overall ADLs: mod I with all basic ADLS Transfers/ambulation related to ADLs: Eating: uses RUE 20%, currently using non dominant LUE,  Grooming: using non dominant LUE UB Dressing: mod I difficulty with buttons LB Dressing: mod I Toileting: mod I with cane Bathing: has tub bench Tub Shower transfers: mod I  Equipment: Transfer tub  bench  IADLs: Shopping: needs assist for transportation Light housekeeping: Pt is performing light cleaning, he has occasional assist Meal Prep: microwave meals primarily, does a little stove top Medication management: pt handles  Financial management: pt handles Handwriting: 90% legibility, handwriting is small   MOBILITY STATUS:  mod I with cane  ACTIVITY TOLERANCE: Activity tolerance: standing  UPPER EXTREMITY ROM:  grossly WFLS  UPPER EXTREMITY MMT:     MMT Right eval Left eval  Shoulder flexion 3+/5 4+/5  Shoulder abduction    Shoulder adduction    Shoulder extension    Shoulder internal rotation    Shoulder external rotation    Middle trapezius    Lower trapezius    Elbow flexion 4-/5 4-/5  Elbow  extension 4-/5 4-/5  Wrist flexion    Wrist extension    Wrist ulnar deviation    Wrist radial deviation    Wrist pronation    Wrist supination    (Blank rows = not tested)  HAND FUNCTION: Grip strength: Right: 50 lbs; Left: 63 lbs  COORDINATION: 9 Hole Peg test: Right: 59.69 sec; Left: 29.35 sec  SENSATION:diminished in RUE  COGNITION: Overall cognitive status:unable to spell WORLD backwards correctly, recalls 2/3 words following a delay.  VISION ASSESSMENT: Not tested- denies visual changes   OBSERVATIONS: Pleasant gentleman motivated to improve                                                                                                                              TREATMENT DATE: 11/26/23- UBE x 6 mins level 3 for conditioning Copying small peg design with RUE for increased fine motor coordination with a cognitive component, min difficulty, increased time required, v.c  Reveiwed previously issued yellow theraband exercises, 10- 20 reps each, min v.c shoulder flexion with foam roll 15 reps to eye level with improved control Followed by shoulder flexion low to mid range with 1 lbs weight in each hand focus on control  11/21/23- Pt practiced placing screwing bolts into pegboard with RUE then using tools such as screw driver, Customer service manager and wrench to tighten items with RUE min-mod v.c, pt also practiced locking and unlocking a pad lock. Pt with mod difficulty using wrench, min difficulty for using screw driver. UBE x5 mins level 1 for conditioning  11/19/23 Closed chain shoulder flexion with foam roll 2 sets of 10 reps min v.c and facilitation for shoulder positioning and focus on control. Reviewed yellow theraband exercises 15 reps each bilateral UE's. Pt performed biceps  curls with bilateral UE's simultaneously with cues to have RUE mirror movements of LUE, min v.c Placing grooved pegs into pegboard with RUE for increased fine motor coordination, min difficulty/ v.c   Removing pegs with tweezers, for sustained pinch for 1/2 the items,  mod difficulty/ drops   11/15/23 UBE x 6 mins level 1 for conditioning closed chain shoulder flexion with medium ball x 15 reps then with foam roll for send  set min v.c for control. Yellow theraband for biceps curls and triceps extension, 10-15 reps each UE, min v.c copying small peg design with RUE min-mod difficulty/ drops, min v.c to avoid compensation.  11/12/23- Reviewed cane HEP 10-15 reps min v.c Functional reaching to place and retrieve clothespins(1-8#) from overhead target(vertical yardstick) with RUE for functional reach and sustained pinch, min difficulty, v.c Placing metal pegs( 3 sizes) into pegboard with RUE then removing with in hand manipulation, min difficulty/ v.c  Flipping and dealing cards with RUE for increased fine motor coordination, min v.c   11/09/23: Pt instructed in cane HEP.  Pt fatigued quickly and with difficulty with control, but responded well to cueing.  Also reviewed and added to coordination HEP.  Pt able to copy shopping list with good legibility, but several spelling errors--pt reports difficulty with spelling if generating a list at home.   10/31/23- eval     PATIENT EDUCATION: Education details:   yellow theraband and shoulder flexion with paper towel roll review Person educated: Patient Education method: Explanation, Demonstration, Tactile cues, Verbal cues, and Handouts Education comprehension: verbalized understanding, returned demonstration, and verbal cues required  HOME EXERCISE PROGRAM: beginning coordination 3/26 11/09/23  Cane HEP 4/10- biceps triceps with yellow band, shoulder flexion with paper towel roll  GOALS: Goals reviewed with patient? Yes  SHORT TERM GOALS: Target date: 12/01/23  I with inital HEP Goal status: met for cane  2.  Pt will report feeding himself at least 50% of the time with RUE. Baseline: uses 20% x Goal status:  ongoing  3.  Pt will  demonstrate improved RUE fine motor coordination for ADLs as evidenced by decreasing 9 hole peg test score to 52 secs or less Goal status: ongoing  4.  Pt will report consistently brushing his teeth with RUE Goal status: ongoing  5.  I with memory compensation strategies Goal status: ongoing  6.  Pt will report that he has resumed moderate level home management mod  Goal status: ongoing   LONG TERM GOALS: Target date: 01/23/24  I with updated HEP Goal status: INITIAL  2.  Pt will demonstrate improved RUE fine motor coordination for ADLs as evidenced by decreasing 9 hole peg test score to 48 secs or less Goal status: INITIAL  3.  Pt will demonstrate ability to carry a plate in RUE without spills. Goal status: INITIAL  4.  Pt will write a short paragraph with 100% legibility and minimal decrease in letter size. Goal status: INITIAL  5.  Pt will resume use of RUE as dominant hand at least 75% of the time for ADLs/ IADLs. Goal status: INITIAL  6. Pt will retrieve a 3 lbs object from eye level shelf using RUE with good control  Goal status: inital  ASSESSMENT:  CLINICAL IMPRESSION: Pt is progressing towards goals. Pt demonstrates improving UE strength and control.PERFORMANCE DEFICITS: in functional skills including ADLs, IADLs, coordination, dexterity, sensation, strength, flexibility, Fine motor control, Gross motor control, mobility, balance, endurance, decreased knowledge of precautions, decreased knowledge of use of DME, and UE functional use, cognitive skills including attention, memory, problem solving, safety awareness, and thought, and psychosocial skills including coping strategies, environmental adaptation, habits, interpersonal interactions, and routines and behaviors.   IMPAIRMENTS: are limiting patient from ADLs, IADLs, play, leisure, and social participation.   CO-MORBIDITIES: may have co-morbidities  that affects occupational performance. Patient will benefit from  skilled OT to address above impairments and improve overall function.  MODIFICATION OR ASSISTANCE TO COMPLETE EVALUATION:  No modification of tasks or assist necessary to complete an evaluation.  OT OCCUPATIONAL PROFILE AND HISTORY: Detailed assessment: Review of records and additional review of physical, cognitive, psychosocial history related to current functional performance.  CLINICAL DECISION MAKING: LOW - limited treatment options, no task modification necessary  REHAB POTENTIAL: Good  EVALUATION COMPLEXITY: Low    PLAN:  OT FREQUENCY: 2x/week  OT DURATION: 12 weeks  PLANNED INTERVENTIONS: 97168 OT Re-evaluation, 97535 self care/ADL training, 91478 therapeutic exercise, 97530 therapeutic activity, 97112 neuromuscular re-education, 97140 manual therapy, 97113 aquatic therapy, 97035 ultrasound, 97018 paraffin, 29562 moist heat, 97010 cryotherapy, 97034 contrast bath, 97014 electrical stimulation unattended, 97129 Cognitive training (first 15 min), 13086 Cognitive training(each additional 15 min), 57846 Orthotics management and training, 96295 Splinting (initial encounter), passive range of motion, balance training, functional mobility training, energy conservation, coping strategies training, patient/family education, and DME and/or AE instructions  RECOMMENDED OTHER SERVICES: n/a  CONSULTED AND AGREED WITH PLAN OF CARE: Patient  PLAN FOR NEXT SESSION:   coordination.   Dharma Pare, OTR/L 11/26/2023, 1:01 PM

## 2023-11-26 NOTE — Therapy (Signed)
 OUTPATIENT SPEECH LANGUAGE PATHOLOGY TREATMENT   Patient Name: Adrian [name] MRN: [id number] DOB:01-30-1956, 68 y.o., male Today's Date: 11/26/2023  PCP: NA REFERRING PROVIDER: Genetta Kenning, MD  END OF SESSION:  End of Session - 11/26/23 1105     Visit Number 6    Number of Visits 17    Date for SLP Re-Evaluation 01/07/24    SLP Start Time 1100    SLP Stop Time  1140    SLP Time Calculation (min) 40 min    Activity Tolerance Patient tolerated treatment well             Past Medical History:  Diagnosis Date   AF (paroxysmal atrial fibrillation) (HCC) 11/29/2018   Atrial flutter (HCC) 10/12/2015   a. TEE 3/17 with ? LAA clot-->s/p TEE/DCCV 11/24/2015; s/p DCCV 12/2022;s/p ablation 06/2023   CAD (coronary artery disease), native coronary artery 12/06/2015   a. 10/2015 MV: EF  37%, reversible defect inferior apex, intermediate risk findings; b. 10/2015 Cath: 20% mid RCA.   Carotid artery stenosis    1-39% right ICA stenosis and occluded left ICA   Colonic diverticular abscess    Diverticulitis    History of chemotherapy 2005   Cisplatin   Hypothyroidism    NICM (nonischemic cardiomyopathy) (HCC) 10/14/2015   a. Tachy mediated?;  b. Echo 3/17 - Mild concentric LVH, EF 30-35%, anteroseptal, anterior, anterolateral, apical anterior, lateral hypokinesis, trivial MR, mild to moderately reduced RVSF; c. LHC 3/17 - mRCA 20%   Radiation NOv.3,2005-Dec. 15, 2005   6810 cGy in 30 fractions   Tonsil cancer [facility]) 2005   Dr Ava Lei.  XRT   Past Surgical History:  Procedure Laterality Date   A-FLUTTER ABLATION N/A 06/19/2023   Procedure: A-FLUTTER ABLATION;  Surgeon: Boyce Byes, MD;  Location: [facility] INVASIVE CV LAB;  Service: Cardiovascular;  Laterality: N/A;   APPENDECTOMY N/A 03/07/2019   Procedure: ROBOTIC ASSISTED APPENDECTOMY;  Surgeon: Candyce Champagne, MD;  Location: [facility] ORS;  Service: General;  Laterality: N/A;   CARDIAC CATHETERIZATION N/A 10/18/2015   Procedure:  Left Heart Cath and Coronary Angiography;  Surgeon: Odie Benne, MD;  Location: [facility] INVASIVE CV LAB;  Service: Cardiovascular;  Laterality: N/A;   CARDIOVERSION N/A 11/24/2015   Procedure: CARDIOVERSION;  Surgeon: Loyde Rule, MD;  Location: [facility] ENDOSCOPY;  Service: Cardiovascular;  Laterality: N/A;   CARDIOVERSION N/A 12/20/2022   Procedure: CARDIOVERSION;  Surgeon: Bridgette Campus, MD;  Location: [facility] INVASIVE CV LAB;  Service: Cardiovascular;  Laterality: N/A;   CYSTOSCOPY WITH STENT PLACEMENT Bilateral 03/07/2019   Procedure: CYSTOSCOPY WITH BILATERAL FIREFLY INJECTION;  Surgeon: Andrez Banker, MD;  Location: [facility] ORS;  Service: Urology;  Laterality: Bilateral;   GASTROSTOMY TUBE PLACEMENT  07/04/2004   IR - G tube for tonsilar cancer   IR ANGIO INTRA EXTRACRAN SEL COM CAROTID INNOMINATE UNI R MOD SED  03/09/2019   IR ANGIO VERTEBRAL SEL VERTEBRAL UNI R MOD SED  03/09/2019   IR CT HEAD LTD  03/09/2019   IR PERCUTANEOUS ART THROMBECTOMY/INFUSION INTRACRANIAL INC DIAG ANGIO  03/09/2019   IR RADIOLOGIST EVAL & MGMT  12/18/2018   LAMINECTOMY     C5/placement of steel plate   LOOP RECORDER INSERTION N/A 06/19/2023   Procedure: LOOP RECORDER INSERTION;  Surgeon: Boyce Byes, MD;  Location: [facility] INVASIVE CV LAB;  Service: Cardiovascular;  Laterality: N/A;   NECK SURGERY  2003   replaced disk   RADIOLOGY WITH ANESTHESIA N/A 03/08/2019   Procedure: IR  WITH ANESTHESIA;  Surgeon: Luellen Sages, MD;  Location: [facility] OR;  Service: Radiology;  Laterality: N/A;   TEE WITHOUT CARDIOVERSION N/A 10/15/2015   Procedure: TRANSESOPHAGEAL ECHOCARDIOGRAM (TEE);  Surgeon: Darlis Eisenmenger, MD;  Location: [facility] ENDOSCOPY;  Service: Cardiovascular;  Laterality: N/A;   TEE WITHOUT CARDIOVERSION N/A 11/24/2015   Procedure: TRANSESOPHAGEAL ECHOCARDIOGRAM (TEE);  Surgeon: Loyde Rule, MD;  Location: [facility] ENDOSCOPY;  Service: Cardiovascular;  Laterality: N/A;   TEE WITHOUT CARDIOVERSION N/A 12/20/2022   Procedure:  TRANSESOPHAGEAL ECHOCARDIOGRAM;  Surgeon: Bridgette Campus, MD;  Location: [facility] INVASIVE CV LAB;  Service: Cardiovascular;  Laterality: N/A;   XI ROBOTIC ASSISTED COLOSTOMY TAKEDOWN N/A 03/07/2019   Procedure: XI ROBOTIC ASSISTED LOW ANTERIOR RESECTION, RIGID PROCTOSCOPY;  Surgeon: Candyce Champagne, MD;  Location: [facility] ORS;  Service: General;  Laterality: N/A;   Patient Active Problem List   Diagnosis Date Noted   Carotid occlusion, left 10/23/2023   ICH (intracerebral hemorrhage) (HCC) 08/11/2023   Carotid artery stenosis    Atrial flutter (HCC) 12/19/2022   Essential hypertension 04/22/2021   Hypokalemia 03/12/2019   Expressive aphasia 03/10/2019   Dysphagia 03/10/2019   Middle cerebral artery embolism, left 03/09/2019   Diverticular stricture (HCC) 03/07/2019   Bacteria in urine    Chronic atrial fibrillation (HCC) 12/02/2018   Current use of long term anticoagulation 12/02/2018   UTI (urinary tract infection) 11/29/2018   Hyponatremia 11/29/2018   PAF (paroxysmal atrial fibrillation) (HCC) 11/29/2018   Diverticulitis of large intestine with abscess 09/23/2018   Dyslipidemia 12/05/2017   CAD (coronary artery disease), native coronary artery 12/06/2015   SOB (shortness of breath)    NICM (nonischemic cardiomyopathy) (HCC)    Thyroid  activity decreased    Hypothyroidism 08/12/2013   History of radiation therapy 05/30/2012   Smokes tobacco daily 05/30/2012   Cancer of tonsillar fossa (HCC) 11/27/2011   Tonsil cancer (HCC) 2005   History of chemotherapy 2005    ONSET DATE: Referred on 10/26/23 (CVA in Jan)   REFERRING DIAG: I61.0 (ICD-10-CM) - Nontraumatic subcortical hemorrhage of left cerebral hemisphere (HCC)   THERAPY DIAG:  Cognitive communication deficit  Dysarthria and anarthria  Rationale for Evaluation and Treatment: Rehabilitation  SUBJECTIVE:   SUBJECTIVE STATEMENT: Pt reports he had a good weekend and no obvious difficulties in communication.   Pt accompanied by:  self  PERTINENT HISTORY: Per chart review: 68 y.o. male with history of atrial fibrillation, on Eliquis , carotid artery stenosis, hypothyroidism , CVA 1/25  PAIN:  Are you having pain? No  FALLS: Has patient fallen in last 6 months?  No  LIVING ENVIRONMENT: Lives with: lives alone Lives in: House/apartment Ex Wife will be driving to appointments (or friends) Sheryle Donning  PLOF:  Level of assistance: Independent with ADLs, Independent with IADLs Employment: Retired; [employer]   PATIENT GOALS: memory, word finding, speech   OBJECTIVE:  Note: Objective measures were completed at Evaluation unless otherwise noted.  DIAGNOSTIC FINDINGS: Per Chart Review  CT HEAD CODE STROKE WO CONTRAST 08/11/23  IMPRESSION: 1.8 x 2.0 x 1.1 cm parenchymal hemorrhage centered in the left basal ganglia with intraventricular extension. No evidence of hydrocephalus at the time of exam. No significant midline shift.   Findings were paged to Dr. Murvin Arthurs on 08/11/23 at 2:29 PM     Electronically Signed   By: Clora Dane M.D.   On: 08/11/2023 14:31  COGNITION: Overall cognitive status: Impaired Areas of impairment:  Attention: Impaired: Selective, Alternating, Divided Memory: Impaired: Short term Prospective Executive function: Impaired:  Problem solving, Organization, Planning, and Slow processing Functional deficits: Reports difficulty with recall of important information  AUDITORY COMPREHENSION: Overall auditory comprehension: Appears intact YES/NO questions: Appears intact Following directions: Appears intact Conversation: Complex Interfering components: attention and processing speed Effective technique: extra processing time, pausing, and repetition/stressing words  READING COMPREHENSION: Intact  EXPRESSION: verbal  VERBAL EXPRESSION: Level of generative/spontaneous verbalization: conversation  Comments: Anomia  Non-verbal means of communication: N/A  WRITTEN EXPRESSION: Dominant hand:  right Written expression: Not tested  MOTOR SPEECH: Overall motor speech: impaired Level of impairment: Conversation Respiration:  WFL Phonation: normal Resonance: WFL Articulation: Appears intact; pt reports slurred speech when feeling tired Intelligibility: Intelligible; 100% intelligible at eval; reports worsening speech when fatigued Motor planning: Appears intact Motor speech errors:  NA Interfering components:  NA Effective technique: slow rate and over articulate  ORAL MOTOR EXAMINATION: Overall status: Impaired:   Labial: Right (Symmetry) Facial: Right (Symmetry) *ROM is good. At rest, facial droop on R side. Comments: NA   STANDARDIZED ASSESSMENTS: Initiated CLQT - to complete next session    Cognitive Linguistic Quick Test: AGE - 18 - 69   The Cognitive Linguistic Quick Test (CLQT) was administered to assess the relative status of five cognitive domains: attention, memory, language, executive functioning, and visuospatial skills. Scores from 10 tasks were used to estimate severity ratings (standardized for age groups 18-69 years and 70-89 years) for each domain, a clock drawing task, as well as an overall composite severity rating of cognition.       Task Score Criterion Cut Scores  Personal Facts 7/8 8  Symbol Cancellation 11/12 11  Confrontation Naming 10/10 10  Clock Drawing  11/13 12  Story Retelling 6/10 6  Symbol Trails 10/10 9  Generative Naming 3/9 5  Design Memory 6/6 5  Mazes  8/8 7  Design Generation 5/13 6     Cognitive Domain Composite Score Severity Rating  Attention 190/215 WNL  Memory 148/185 Mild  Executive Function 26/40 WNL  Language 26/37 Mild  Visuospatial Skills 95/105 WNL  Clock Drawing  11/13 Mild  Composite Severity Rating  WNL           PATIENT REPORTED OUTCOME MEASURES (PROM): Neuro QOL - Cognitive Short Form: 29                                                                                                                              TREATMENT DATE:   11/26/23: Pt was seen for skilled ST services targeting cognitive-communication. Pt reports he used "synonym" word finding strategy in an instance of anomia over the week. Pt reports he hasn't had any other significant difficulty with word finding. We reviewed "Be Clear" strategies for dysarthria re: speaking louder, over-articulating, slow down. SLP checked-in on pt's feelings regarding cognitive difficulties. He reports he hasn't noticed any significant problems; however, he realizes he has forgotten to bring his folder in x2. SLP suggested writing down tasks. Pt reported difficulty  with writing from his previous strokes. SLP suggested using speech to text. Pt reported he has been leaving his cell phone at home. SLP encouraged him to always keep his cell nearby in case of emergencies, as well as, a memory tool. Pt attempted to write list of tasks needed to complete. Spelling errors noted. SLP encouraged pt to spell the word in his head prior to writing it down vs dividing attention between trying to figure out how to spell the word while attempting to write. Pt required max cue reminders to first spell the word in his head before attempting to write. Pt was noted to reduce errors by ~50% using therapist strategy.   11/20/23: Pt was seen for skilled ST services targeting cognitive-communication and word finding strategies. Pt completed Cognitive PROMs measure - see above in blue. Pt reports he hasn't had any significant instances of anomia - given a 2-3 second delay, word typically pops out. Pt reports he is to go golfing (on the putting green) with a friend for his birthday. We discussed word finding strategies he could use in conversation. SLP facilitated word finding practice using multiple meaning words. Pt was tasked with finding the synonym (or a phrase close) for the given word. SLP challenged the patient to find synonyms to different golf terms in preparation for his  outing. SLP briefly educated on "Be Clear" for dysarthria. To continue next session.   11/15/23: Pt was seen for skilled ST services targeting cognitive-communication and word finding strategies. SLP initiated education on word finding. SLP provided examples of each strategies and how to use in conversation. Pt reported he feels he would like to the following word finding strategies re: delay, describe, association, look it up. Pt was noted to have errors when reading aloud which was impacting his reading comprehension. Pt was noted to delete sounds from words  and add functor words while reading. SLP trialed re: pointing to each word with pen, re-reading aloud, re-reading silently to remove expressive component. To demonstrate "describe" strategy through Semantic Feature Analysis (SFA) next session.   11/09/23: Pt was seen for skilled ST services targeting completed cognitive-communication testing and education. SLP reviewed CLQT results with patient. He had no further questions. SLP provided pt with cognitive-communication handout. Pt reported difficulty with re: recalling details in conversations, understanding TV shows, difficulty with expressing thoughts, and reading/writing.  Pt has residual aphasia from previous stroke. To provide strategies in this POC.   11/07/23: Provided brief education on cognitive-communication disorder and speech intelligibility.   PATIENT EDUCATION: Education details: SLP role in cognitive-communication impairments Person educated: Patient Education method: Explanation Education comprehension: needs further education   GOALS: Goals reviewed with patient? No; to update after further evaluation  SHORT TERM GOALS: Target date: 12/08/23  Continue Cognitive testing to determine needs Baseline: Goal status: MET  2.  Complete PROMs measure Baseline:  Goal status: INITIAL  3.  Pt will recall 3 word finding strategies to use in instances of anomia with  independently. Baseline:  Goal status: INITIAL  4.  Pt will verbalize 2 attention strategies to improve concentration during functional tasks.  Baseline:  Goal status: INITIAL  5. Pt will verbalize 2 memory strategies for recall of important information.  Baseline:  Goal status: INITIAL    LONG TERM GOALS: Target date: 01/08/24  Pt will improve score on PROMs measure Baseline:  Goal status: INITIAL  2.  Pt will report successful use of word finding strategies at home and in the community. Baseline:  Goal status: INITIAL  3.  Pt will report successful use of attention strategies at home and in the community. Baseline:  Goal status: INITIAL  4.  Pt will report successful use of memory strategies at home and in the community.  Baseline:  Goal status: INITIAL   ASSESSMENT:  CLINICAL IMPRESSION: Pt is a 68 yo male who presents to ST OP for evaluation post recent CVA in Jan. Pt lives alone and will be driven to appointments by ex-wife or friends. Pt endorses speech changes, in addition to sx associated with cognitive-communication challenges [id number] of new information, word finding, and slowed processing.  Pt was assessed using CLQT - to complete next session. SLP observed difficulty with short term memory, instances of anomia, and very mild dysarthria. Pt was 100% intelligible in quiet, therapy room to an unfamiliar listener. He reports speech is worse when feeling tired. SLP to provide strategies for pt use in moments where speech may be unclear. Pt reports no difficulty with swallowing at this time and is tolerating a regular/thin diet. SLP rec skilled ST services to address cognitive-communication impairment and dysarthria to maximize functional independence and increase QOL.     OBJECTIVE IMPAIRMENTS: include memory, executive functioning, expressive language, and dysarthria. These impairments are limiting patient from effectively communicating at home and in  community. Factors affecting potential to achieve goals and functional outcome are  NA . Patient will benefit from skilled SLP services to address above impairments and improve overall function.  REHAB POTENTIAL: Good  PLAN:  SLP FREQUENCY: 1-2x/week  SLP DURATION: 8 weeks  PLANNED INTERVENTIONS: Environmental controls, Cueing hierachy, Internal/external aids, Functional tasks, SLP instruction and feedback, Compensatory strategies, Patient/family education, and [id number] Treatment of speech (30 or 45 min)     [employer], CCC-SLP 11/26/2023, 11:06 AM

## 2023-11-26 NOTE — Therapy (Signed)
 OUTPATIENT PHYSICAL THERAPY TREATMENT   Patient Name: Adrian [name] MRN: [id number] DOB:10/18/1955, 68 y.o., male Today's Date: 11/26/2023  END OF SESSION:  PT End of Session - 11/26/23 1058     Visit Number 8    Number of Visits 17    Date for PT Re-Evaluation 12/26/23    Authorization Type MCR    Authorization Time Period 10/31/23 to 12/26/23    Progress Note Due on Visit 10    PT Start Time 1020    PT Stop Time 1100    PT Time Calculation (min) 40 min    Activity Tolerance Patient tolerated treatment well    Behavior During Therapy [facility] for tasks assessed/performed                    Past Medical History:  Diagnosis Date   AF (paroxysmal atrial fibrillation) (HCC) 11/29/2018   Atrial flutter (HCC) 10/12/2015   a. TEE 3/17 with ? LAA clot-->s/p TEE/DCCV 11/24/2015; s/p DCCV 12/2022;s/p ablation 06/2023   CAD (coronary artery disease), native coronary artery 12/06/2015   a. 10/2015 MV: EF  37%, reversible defect inferior apex, intermediate risk findings; b. 10/2015 Cath: 20% mid RCA.   Carotid artery stenosis    1-39% right ICA stenosis and occluded left ICA   Colonic diverticular abscess    Diverticulitis    History of chemotherapy 2005   Cisplatin   Hypothyroidism    NICM (nonischemic cardiomyopathy) (HCC) 10/14/2015   a. Tachy mediated?;  b. Echo 3/17 - Mild concentric LVH, EF 30-35%, anteroseptal, anterior, anterolateral, apical anterior, lateral hypokinesis, trivial MR, mild to moderately reduced RVSF; c. LHC 3/17 - mRCA 20%   Radiation NOv.3,2005-Dec. 15, 2005   6810 cGy in 30 fractions   Tonsil cancer [facility]) 2005   Dr Ava Lei.  XRT   Past Surgical History:  Procedure Laterality Date   A-FLUTTER ABLATION N/A 06/19/2023   Procedure: A-FLUTTER ABLATION;  Surgeon: Boyce Byes, MD;  Location: [facility] INVASIVE CV LAB;  Service: Cardiovascular;  Laterality: N/A;   APPENDECTOMY N/A 03/07/2019   Procedure: ROBOTIC ASSISTED APPENDECTOMY;  Surgeon: Candyce Champagne, MD;  Location: [facility] ORS;  Service: General;  Laterality: N/A;   CARDIAC CATHETERIZATION N/A 10/18/2015   Procedure: Left Heart Cath and Coronary Angiography;  Surgeon: Odie Benne, MD;  Location: [facility] INVASIVE CV LAB;  Service: Cardiovascular;  Laterality: N/A;   CARDIOVERSION N/A 11/24/2015   Procedure: CARDIOVERSION;  Surgeon: Loyde Rule, MD;  Location: [facility] ENDOSCOPY;  Service: Cardiovascular;  Laterality: N/A;   CARDIOVERSION N/A 12/20/2022   Procedure: CARDIOVERSION;  Surgeon: Bridgette Campus, MD;  Location: [facility] INVASIVE CV LAB;  Service: Cardiovascular;  Laterality: N/A;   CYSTOSCOPY WITH STENT PLACEMENT Bilateral 03/07/2019   Procedure: CYSTOSCOPY WITH BILATERAL FIREFLY INJECTION;  Surgeon: Andrez Banker, MD;  Location: [facility] ORS;  Service: Urology;  Laterality: Bilateral;   GASTROSTOMY TUBE PLACEMENT  07/04/2004   IR - G tube for tonsilar cancer   IR ANGIO INTRA EXTRACRAN SEL COM CAROTID INNOMINATE UNI R MOD SED  03/09/2019   IR ANGIO VERTEBRAL SEL VERTEBRAL UNI R MOD SED  03/09/2019   IR CT HEAD LTD  03/09/2019   IR PERCUTANEOUS ART THROMBECTOMY/INFUSION INTRACRANIAL INC DIAG ANGIO  03/09/2019   IR RADIOLOGIST EVAL & MGMT  12/18/2018   LAMINECTOMY     C5/placement of steel plate   LOOP RECORDER INSERTION N/A 06/19/2023   Procedure: LOOP RECORDER INSERTION;  Surgeon: Boyce Byes, MD;  Location:  [facility] INVASIVE CV LAB;  Service: Cardiovascular;  Laterality: N/A;   NECK SURGERY  2003   replaced disk   RADIOLOGY WITH ANESTHESIA N/A 03/08/2019   Procedure: IR WITH ANESTHESIA;  Surgeon: Luellen Sages, MD;  Location: [facility] OR;  Service: Radiology;  Laterality: N/A;   TEE WITHOUT CARDIOVERSION N/A 10/15/2015   Procedure: TRANSESOPHAGEAL ECHOCARDIOGRAM (TEE);  Surgeon: Darlis Eisenmenger, MD;  Location: [facility] ENDOSCOPY;  Service: Cardiovascular;  Laterality: N/A;   TEE WITHOUT CARDIOVERSION N/A 11/24/2015   Procedure: TRANSESOPHAGEAL ECHOCARDIOGRAM (TEE);  Surgeon: Loyde Rule, MD;   Location: [facility] ENDOSCOPY;  Service: Cardiovascular;  Laterality: N/A;   TEE WITHOUT CARDIOVERSION N/A 12/20/2022   Procedure: TRANSESOPHAGEAL ECHOCARDIOGRAM;  Surgeon: Bridgette Campus, MD;  Location: [facility] INVASIVE CV LAB;  Service: Cardiovascular;  Laterality: N/A;   XI ROBOTIC ASSISTED COLOSTOMY TAKEDOWN N/A 03/07/2019   Procedure: XI ROBOTIC ASSISTED LOW ANTERIOR RESECTION, RIGID PROCTOSCOPY;  Surgeon: Candyce Champagne, MD;  Location: [facility] ORS;  Service: General;  Laterality: N/A;   Patient Active Problem List   Diagnosis Date Noted   Carotid occlusion, left 10/23/2023   ICH (intracerebral hemorrhage) (HCC) 08/11/2023   Carotid artery stenosis    Atrial flutter (HCC) 12/19/2022   Essential hypertension 04/22/2021   Hypokalemia 03/12/2019   Expressive aphasia 03/10/2019   Dysphagia 03/10/2019   Middle cerebral artery embolism, left 03/09/2019   Diverticular stricture (HCC) 03/07/2019   Bacteria in urine    Chronic atrial fibrillation (HCC) 12/02/2018   Current use of long term anticoagulation 12/02/2018   UTI (urinary tract infection) 11/29/2018   Hyponatremia 11/29/2018   PAF (paroxysmal atrial fibrillation) (HCC) 11/29/2018   Diverticulitis of large intestine with abscess 09/23/2018   Dyslipidemia 12/05/2017   CAD (coronary artery disease), native coronary artery 12/06/2015   SOB (shortness of breath)    NICM (nonischemic cardiomyopathy) (HCC)    Thyroid  activity decreased    Hypothyroidism 08/12/2013   History of radiation therapy 05/30/2012   Smokes tobacco daily 05/30/2012   Cancer of tonsillar fossa (HCC) 11/27/2011   Tonsil cancer (HCC) 2005   History of chemotherapy 2005    PCP: Glean Lamy MD   REFERRING PROVIDER: Genetta Kenning, MD  REFERRING DIAG: I61.0 (ICD-10-CM) - Nontraumatic subcortical hemorrhage of left cerebral hemisphere [facility])  THERAPY DIAG:  Other lack of coordination  Other abnormalities of gait and mobility  Unsteadiness on feet  Muscle  weakness (generalized)  Rationale for Evaluation and Treatment: Rehabilitation  ONSET DATE: 08/11/23  SUBJECTIVE:   SUBJECTIVE STATEMENT: No new issues, not catching R toe as much  PERTINENT HISTORY: 1.  History of left basal ganglia hemorrhage approximately 2 and half months ago.  He has completed inpatient rehabilitation as well as home health.  I do think he would be a great candidate for outpatient therapy.  He has gotten back to modified independent level but still has some mild weakness on the right side as well as fine motor issues and motor control issues.  His balance is also off.  He continues wear right AFO because of foot and ankle weakness.  Patient has some short-term memory issues as well Will make referral for outpatient PT OT and speech therapy Physical medicine rehab follow-up in 3 months  PAIN:  Are you having pain? No   PRECAUTIONS: Fall and Other: R hemi, has loop recorder   RED FLAGS: None   WEIGHT BEARING RESTRICTIONS: No  FALLS:  Has patient fallen in last 6 months? No  LIVING ENVIRONMENT: Lives with:  lives alone Lives in: House/apartment Stairs: 2 STE with rail  Has following equipment at home: Single point cane and Environmental consultant - 2 wheeled  OCCUPATION: retired- used to work for [employer]   PLOF: Independent, Independent with basic ADLs, Independent with gait, Independent with transfers, and Requires assistive device for independence  PATIENT GOALS: walk straight without a cane, get back to driving and motorcycling   NEXT MD VISIT: Referring 01/25/24  OBJECTIVE:  Note: Objective measures were completed at Evaluation unless otherwise noted.  DIAGNOSTIC FINDINGS:   CLINICAL DATA:  Stroke, follow up Neuro deficit, acute, stroke suspected   EXAM: MRI HEAD WITHOUT AND WITH CONTRAST   TECHNIQUE: Multiplanar, multiecho pulse sequences of the brain and surrounding structures were obtained without and with intravenous contrast.   CONTRAST:  6.5mL GADAVIST   GADOBUTROL  1 MMOL/ML IV SOLN   COMPARISON:  CT head from today.   FINDINGS: Brain: When comparing across modalities, no substantial change in intraparenchymal hemorrhage in the left basal ganglia with intraventricular extension. No visible surrounding acute hemorrhage or mass lesion; however, acute blood products limits assessment. Prior left frontal infarct with encephalomalacia. No hydrocephalus. No pathologic enhancement.   Vascular: Major arterial flow voids are maintained at the skull base.   Skull and upper cervical spine: Normal marrow signal.   Sinuses/Orbits: Right frontal sinus opacification. Remaining sinuses are clear. No acute orbital findings.   Other: No mastoid effusions.   IMPRESSION: 1. When comparing across modalities, no substantial change in intraparenchymal hemorrhage in the left basal ganglia with intraventricular extension. No progressive mass effect. 2. No visible surrounding acute hemorrhage or mass lesion; however, acute blood products limits assessment  EXAM: CT HEAD WITHOUT CONTRAST   TECHNIQUE: Contiguous axial images were obtained from the base of the skull through the vertex without intravenous contrast.   RADIATION DOSE REDUCTION: This exam was performed according to the departmental dose-optimization program which includes automated exposure control, adjustment of the mA and/or kV according to patient size and/or use of iterative reconstruction technique.   COMPARISON:  Head CT 08/12/2023   FINDINGS: Brain: Interval decrease in size of the previously seen hemorrhage in the left basal ganglia, now measuring 10 x 7 mm, previously 20 x 12 mm. There is resolution of intraventricular blood products. No hydrocephalus. No extra-axial fluid collection. No mass effect. No mass lesion. No CT evidence of an acute cortical infarct. There is chronic left MCA territory infarct involving the left opercular region.   Vascular: No hyperdense vessel  or unexpected calcification.   Skull: Normal. Negative for fracture or focal lesion.   Sinuses/Orbits: No middle ear or mastoid effusion. Paranasal sinuses are notable for complete opacification of the right frontal sinus. Orbits are unremarkable.   Other: None.   IMPRESSION: No acute intracranial abnormality. Interval decrease in size of left basal ganglia hemorrhage, now measuring 10 x 7 mm, previously 20 x 12 mm. Resolution of intraventricular blood products. No hydrocephalus.    PATIENT SURVEYS:    Patient-Specific Activity Scoring Scheme  "0" represents "unable to perform." "10" represents "able to perform at prior level. 0 1 2 3 4 5 6 7 8 9  10 (Date and Score)   Activity Eval     1. Walking straight without the cane  4     2. Strength in RLE   4    3. Coordinated movements of R LE  3   4.    5.    Score 3.7    Total score = sum of  the activity scores/number of activities Minimum detectable change (90%CI) for average score = 2 points Minimum detectable change (90%CI) for single activity score = 3 points     COGNITION: Overall cognitive status: Within functional limits for tasks assessed     SENSATION: Not tested      LOWER EXTREMITY MMT:  MMT Right eval Left eval  Hip flexion 4 5  Hip extension    Hip abduction    Hip adduction    Hip internal rotation    Hip external rotation    Knee flexion 4 5  Knee extension 4 5  Ankle dorsiflexion 1 at best (in AFO, reports no movement in ankle)   Ankle plantarflexion    Ankle inversion    Ankle eversion     (Blank rows = not tested)    FUNCTIONAL TESTS:  5 times sit to stand: 12.8 seconds some off shift from R LE  Timed up and go (TUG): deferred  3 minute walk test: 238ft [facility]     10/31/23 0001  Standardized Balance Assessment  Standardized Balance Assessment Dynamic Gait Index  Dynamic Gait Index  Level Surface 2  Change in Gait Speed 1  Gait with Horizontal Head Turns 2  Gait with  Vertical Head Turns 2  Gait and Pivot Turn 1  Step Over Obstacle 1  Step Around Obstacles 1  Steps 0  Total Score 10    GAIT: Distance walked: 210ft  Assistive device utilized: Single point cane Level of assistance: Modified independence Comments: wide BOS, increased tone in R LE with gait pattern- limited knee ROM and ankle tends to invert a bit , mild unsteadiness with corners, limited rotation at trunk/hips                                                                                                                                 TREATMENT DATE:  11/26/23:  Therapeutic activities and neuromuscular re education: Gait outdoors with st cane  Airex heel /toe rocks Airex L foot forward, R foot back semi tandem static holds 30 sec Airex L foot forward, R foot back, semi tandem with 2# therabar forward punches, then B shoulder flex, then rotations each side  Leg press, 30# B 20x Leg press R LE only 30# 20 x 2 High marching 100' with st cane  Seated hamstring curls 35#, B   11/21/23:  Therapeutic activities and Neuromuscular reeducation:  Gait outdoors, x 7 min with st cane on asphalt, mild incline, curb , with SBA by PT In ll bars for B heel raises  In ll bars for staggered standing with L foot on airex In ll bars alt toe taps to 2 cones  In ll bars for 3 way hip L LE Then in ll bars for towel slides L Le with R wbing emphasis on maintaining upright R hip position, tends to sink, trendelenburg to R and hyperextend R knee which may be contributing  to catching R toe, mass practice  Leg press, 30# B 20x Leg press R LE only 30# 20 x 2 Marching with walking, 20' x 2 with st cane      11/20/23  Forward step ups 4 inch step x15 B Lateral step ups 4 inch step x15 B Nustep L5x4 minutes all four extremities, then another 4 minutes at L6  STS green band above knees x10 cues for equal  Forward and backward step overs  over half foam roll with U UE support (HEP practice)   Side  steps blue foam pad x3 rounds up to MinA for balance  Lateral side steps over half foam rolls x3 laps difficulty with foot clearance     11/15/23  Nustep L5x8 minutes all four extremities for w/u and neural priming  Forward step ups 4 inch step x15 B Lateral step ups 4 inch step x12 B U UE support on rail STS yellow TB goblet hold x15    Ipsilateral target taps on 4 inch step, cues for foot clearance and up to ModA for balance recovery  Cross midline target taps on 4 inch step, cues for foot clearance, intermittent MinA  Forward and lateral step overs half foam rolls in // bars x3 laps each    11/12/23 Leg press 20# 2x10  NuStep L5 x all extremities, x3 just LE's  Side steps in bars Balance on airex  -eyes open -eyes closed -feet together -cone taps  -3 way taps  Step overs 4" stairs  Seated box taps 5# forward and to the side 6"    11/09/23 Standing Heel/toe raise 2x10 Gastroc stretch x 30" Soleus stretch x 30" Staggered stance fwd/bwd weight shifts 2x10 R&L Staggered stance heel/toe x10 3 way clock reach, wash rag under foot 2x10 R&L Amb SPC with R AFO, working on heel/toe pattern and symmetrical step lengths multiple 2x3 laps around gym, clockwise and counter clockwise  11/05/23 NuStep L5x66mins, did just LE after 3 mins  Step ups 4" Leg ext 3# 2x10 HS curls red 2x10 Rocker board  Side steps on beam STS x10 then with OHP   10/31/23  Eval, POC, HEP- lots of education on stroke recovery and 6 month window for optimal regains of strength/balance/function, encouraged him to keep doing HEP from HHPT as well as balance work he found for himself, has been able to get on/off and use his TM safely at home so encouraged this as well  Nustep L5 x5 minutes BLEs only      PATIENT EDUCATION:  Education details: exam findings, POC, HEP, otherwise as above  Person educated: Patient Education method: Programmer, multimedia, Demonstration, and Handouts Education comprehension:  verbalized understanding, returned demonstration, and needs further education  HOME EXERCISE PROGRAM:  Access Code: [email or web address] URL: [email or web address] Date: 11/20/2023 Prepared by: Terrel Ferries  Exercises - Heel Toe Raises with Counter Support  - 1 x daily - 7 x weekly - 2 sets - 10 reps - Stride Stance Weight Shift  - 1 x daily - 7 x weekly - 2 sets - 10 reps - Single Leg Balance with Clock Reach  - 1 x daily - 7 x weekly - 2 sets - 10 reps - Sit to Stand with Resistance Around Legs  - 1 x daily - 7 x weekly - 2 sets - 10 reps - Forward Step Over with Counter Support  - 1 x daily - 7 x weekly - 2 sets - 10 reps - Backward Step Over with Counter  Support  - 1 x daily - 7 x weekly - 2 sets - 10 reps   ASSESSMENT:  CLINICAL IMPRESSION:  11/26/23 again tried to address his hip stability and motor control to prevent him from "sinking down" so much on his R side with gait. More focus on strengthening entire R LE today.  Noted less dragging/catching R toe today when we walked outside.   Advanced his dynamic balance challenges as well.    He will benefit from ongoing skilled PT to address his deficits from CVA and improve his efficiency with motor tasks, walking.   From eval: Patient is a 68 y.o. M who was seen today for physical therapy evaluation and treatment for I61.0 (ICD-10-CM) - Nontraumatic subcortical hemorrhage of left cerebral hemisphere [facility]). He is very motivated and moving a very high level given the hemorrhagic CVA. His main concerns are balance/coordination and getting back to walking without assistive device. Anticipate he will do very well with skilled PT services.   OBJECTIVE IMPAIRMENTS: Abnormal gait, decreased activity tolerance, decreased balance, decreased coordination, decreased knowledge of use of DME, decreased mobility, difficulty walking, decreased strength, and decreased safety awareness.   ACTIVITY LIMITATIONS: standing, squatting, stairs, transfers,  and locomotion level  PARTICIPATION LIMITATIONS: driving, shopping, community activity, and yard work  PERSONAL FACTORS: Age, Behavior pattern, Fitness, Past/current experiences, Social background, Time since onset of injury/illness/exacerbation, and Transportation are also affecting patient's functional outcome.   REHAB POTENTIAL: Good  CLINICAL DECISION MAKING: Evolving/moderate complexity  EVALUATION COMPLEXITY: Moderate   GOALS: Goals reviewed with patient? No  SHORT TERM GOALS: Target date: 11/28/2023   Will be compliant with appropriate progressive HEP  Baseline: Goal status: INITIAL  2.  Will score 14/24 on DGI  Baseline: 10 Goal status: INITIAL  3.  Will be able to perform all functional transfers without compensation strategies or offshift from hemi LE  Baseline:  Goal status: INITIAL  4.  Will be able to ambulate household distances without AD, Mod(I) Baseline:  Goal status: INITIAL    LONG TERM GOALS: Target date: 12/26/2023    MMT in R LE to be at least 4+/5 in hip and knee musculature  Baseline:  Goal status: INITIAL  2.  Will score 18/24 on DGI to show improved functional balance  Baseline:  Goal status: INITIAL  3.  Will be able to walk at least 24ft over uneven/unsteady surfaces without device and no more than S/ 511ft over even surfaces no device Mod(I) Baseline:  Goal status: INITIAL  4.  Will be able to ascend and descend steps without rail and minimal compensation strategies  Baseline:  Goal status: INITIAL  5.  Will be compliant with advanced HEP vs gym based program at DC to maintain functional gains  Baseline:  Goal status: INITIAL  6.  PSFS to improve by at least 3 points  Baseline: 3.7 Goal status: INITIAL   PLAN:  PT FREQUENCY: 2x/week  PT DURATION: 8 weeks  PLANNED INTERVENTIONS: [id number]- PT Re-evaluation, 97110-Therapeutic exercises, 97530- Therapeutic activity, 97112- Neuromuscular re-education, 97535- Self Care, [id number]-  Manual therapy, [id number]- Gait training, [phone]- Orthotic Fit/training, [phone]- Aquatic Therapy, Stair training, Taping, Dry Needling, Cryotherapy, and Moist heat  PLAN FOR NEXT SESSION: strength, balance, gait training, functional conditioning. Motor control tasks/progressions for R LE. Keep working on foot clearance   Jalene Lacko,PT, DPT, OCS 11/26/23 12:50 PM

## 2023-11-28 ENCOUNTER — Encounter: Payer: Self-pay | Admitting: Physical Therapy

## 2023-11-28 ENCOUNTER — Ambulatory Visit: Admitting: Occupational Therapy

## 2023-11-28 ENCOUNTER — Encounter: Payer: Self-pay | Admitting: Speech Pathology

## 2023-11-28 ENCOUNTER — Ambulatory Visit: Admitting: Physical Therapy

## 2023-11-28 ENCOUNTER — Ambulatory Visit: Admitting: Speech Pathology

## 2023-11-28 DIAGNOSIS — R41841 Cognitive communication deficit: Secondary | ICD-10-CM

## 2023-11-28 DIAGNOSIS — R2689 Other abnormalities of gait and mobility: Secondary | ICD-10-CM

## 2023-11-28 DIAGNOSIS — R2681 Unsteadiness on feet: Secondary | ICD-10-CM

## 2023-11-28 DIAGNOSIS — R4701 Aphasia: Secondary | ICD-10-CM

## 2023-11-28 DIAGNOSIS — R278 Other lack of coordination: Secondary | ICD-10-CM

## 2023-11-28 DIAGNOSIS — R41840 Attention and concentration deficit: Secondary | ICD-10-CM

## 2023-11-28 DIAGNOSIS — M6281 Muscle weakness (generalized): Secondary | ICD-10-CM

## 2023-11-28 DIAGNOSIS — R41844 Frontal lobe and executive function deficit: Secondary | ICD-10-CM

## 2023-11-28 NOTE — Therapy (Unsigned)
 OUTPATIENT SPEECH LANGUAGE PATHOLOGY TREATMENT   Patient Name: Adrian [name] MRN: [id number] DOB:Jan 01, 1956, 68 y.o., male Today's Date: 11/28/2023  PCP: NA REFERRING PROVIDER: Genetta Kenning, MD  END OF SESSION:  End of Session - 11/28/23 0933     Visit Number 7    Number of Visits 17    Date for SLP Re-Evaluation 01/07/24    SLP Start Time 0930    SLP Stop Time  1010    SLP Time Calculation (min) 40 min    Activity Tolerance Patient tolerated treatment well             Past Medical History:  Diagnosis Date   AF (paroxysmal atrial fibrillation) (HCC) 11/29/2018   Atrial flutter (HCC) 10/12/2015   a. TEE 3/17 with ? LAA clot-->s/p TEE/DCCV 11/24/2015; s/p DCCV 12/2022;s/p ablation 06/2023   CAD (coronary artery disease), native coronary artery 12/06/2015   a. 10/2015 MV: EF  37%, reversible defect inferior apex, intermediate risk findings; b. 10/2015 Cath: 20% mid RCA.   Carotid artery stenosis    1-39% right ICA stenosis and occluded left ICA   Colonic diverticular abscess    Diverticulitis    History of chemotherapy 2005   Cisplatin   Hypothyroidism    NICM (nonischemic cardiomyopathy) (HCC) 10/14/2015   a. Tachy mediated?;  b. Echo 3/17 - Mild concentric LVH, EF 30-35%, anteroseptal, anterior, anterolateral, apical anterior, lateral hypokinesis, trivial MR, mild to moderately reduced RVSF; c. LHC 3/17 - mRCA 20%   Radiation NOv.3,2005-Dec. 15, 2005   6810 cGy in 30 fractions   Tonsil cancer [facility]) 2005   Dr Ava Lei.  XRT   Past Surgical History:  Procedure Laterality Date   A-FLUTTER ABLATION N/A 06/19/2023   Procedure: A-FLUTTER ABLATION;  Surgeon: Boyce Byes, MD;  Location: [facility] INVASIVE CV LAB;  Service: Cardiovascular;  Laterality: N/A;   APPENDECTOMY N/A 03/07/2019   Procedure: ROBOTIC ASSISTED APPENDECTOMY;  Surgeon: Candyce Champagne, MD;  Location: [facility] ORS;  Service: General;  Laterality: N/A;   CARDIAC CATHETERIZATION N/A 10/18/2015   Procedure:  Left Heart Cath and Coronary Angiography;  Surgeon: Odie Benne, MD;  Location: [facility] INVASIVE CV LAB;  Service: Cardiovascular;  Laterality: N/A;   CARDIOVERSION N/A 11/24/2015   Procedure: CARDIOVERSION;  Surgeon: Loyde Rule, MD;  Location: [facility] ENDOSCOPY;  Service: Cardiovascular;  Laterality: N/A;   CARDIOVERSION N/A 12/20/2022   Procedure: CARDIOVERSION;  Surgeon: Bridgette Campus, MD;  Location: [facility] INVASIVE CV LAB;  Service: Cardiovascular;  Laterality: N/A;   CYSTOSCOPY WITH STENT PLACEMENT Bilateral 03/07/2019   Procedure: CYSTOSCOPY WITH BILATERAL FIREFLY INJECTION;  Surgeon: Andrez Banker, MD;  Location: [facility] ORS;  Service: Urology;  Laterality: Bilateral;   GASTROSTOMY TUBE PLACEMENT  07/04/2004   IR - G tube for tonsilar cancer   IR ANGIO INTRA EXTRACRAN SEL COM CAROTID INNOMINATE UNI R MOD SED  03/09/2019   IR ANGIO VERTEBRAL SEL VERTEBRAL UNI R MOD SED  03/09/2019   IR CT HEAD LTD  03/09/2019   IR PERCUTANEOUS ART THROMBECTOMY/INFUSION INTRACRANIAL INC DIAG ANGIO  03/09/2019   IR RADIOLOGIST EVAL & MGMT  12/18/2018   LAMINECTOMY     C5/placement of steel plate   LOOP RECORDER INSERTION N/A 06/19/2023   Procedure: LOOP RECORDER INSERTION;  Surgeon: Boyce Byes, MD;  Location: [facility] INVASIVE CV LAB;  Service: Cardiovascular;  Laterality: N/A;   NECK SURGERY  2003   replaced disk   RADIOLOGY WITH ANESTHESIA N/A 03/08/2019   Procedure: IR  WITH ANESTHESIA;  Surgeon: Luellen Sages, MD;  Location: [facility] OR;  Service: Radiology;  Laterality: N/A;   TEE WITHOUT CARDIOVERSION N/A 10/15/2015   Procedure: TRANSESOPHAGEAL ECHOCARDIOGRAM (TEE);  Surgeon: Darlis Eisenmenger, MD;  Location: [facility] ENDOSCOPY;  Service: Cardiovascular;  Laterality: N/A;   TEE WITHOUT CARDIOVERSION N/A 11/24/2015   Procedure: TRANSESOPHAGEAL ECHOCARDIOGRAM (TEE);  Surgeon: Loyde Rule, MD;  Location: [facility] ENDOSCOPY;  Service: Cardiovascular;  Laterality: N/A;   TEE WITHOUT CARDIOVERSION N/A 12/20/2022   Procedure:  TRANSESOPHAGEAL ECHOCARDIOGRAM;  Surgeon: Bridgette Campus, MD;  Location: [facility] INVASIVE CV LAB;  Service: Cardiovascular;  Laterality: N/A;   XI ROBOTIC ASSISTED COLOSTOMY TAKEDOWN N/A 03/07/2019   Procedure: XI ROBOTIC ASSISTED LOW ANTERIOR RESECTION, RIGID PROCTOSCOPY;  Surgeon: Candyce Champagne, MD;  Location: [facility] ORS;  Service: General;  Laterality: N/A;   Patient Active Problem List   Diagnosis Date Noted   Carotid occlusion, left 10/23/2023   ICH (intracerebral hemorrhage) (HCC) 08/11/2023   Carotid artery stenosis    Atrial flutter (HCC) 12/19/2022   Essential hypertension 04/22/2021   Hypokalemia 03/12/2019   Expressive aphasia 03/10/2019   Dysphagia 03/10/2019   Middle cerebral artery embolism, left 03/09/2019   Diverticular stricture (HCC) 03/07/2019   Bacteria in urine    Chronic atrial fibrillation (HCC) 12/02/2018   Current use of long term anticoagulation 12/02/2018   UTI (urinary tract infection) 11/29/2018   Hyponatremia 11/29/2018   PAF (paroxysmal atrial fibrillation) (HCC) 11/29/2018   Diverticulitis of large intestine with abscess 09/23/2018   Dyslipidemia 12/05/2017   CAD (coronary artery disease), native coronary artery 12/06/2015   SOB (shortness of breath)    NICM (nonischemic cardiomyopathy) (HCC)    Thyroid  activity decreased    Hypothyroidism 08/12/2013   History of radiation therapy 05/30/2012   Smokes tobacco daily 05/30/2012   Cancer of tonsillar fossa (HCC) 11/27/2011   Tonsil cancer (HCC) 2005   History of chemotherapy 2005    ONSET DATE: Referred on 10/26/23 (CVA in Jan)   REFERRING DIAG: I61.0 (ICD-10-CM) - Nontraumatic subcortical hemorrhage of left cerebral hemisphere [facility])   THERAPY DIAG:  Cognitive communication deficit  Rationale for Evaluation and Treatment: Rehabilitation  SUBJECTIVE:   SUBJECTIVE STATEMENT: Pt reports he had a good weekend and no obvious difficulties in communication.   Pt accompanied by: self  PERTINENT HISTORY: Per  chart review: 68 y.o. male with history of atrial fibrillation, on Eliquis , carotid artery stenosis, hypothyroidism , CVA 1/25  PAIN:  Are you having pain? No  FALLS: Has patient fallen in last 6 months?  No  LIVING ENVIRONMENT: Lives with: lives alone Lives in: House/apartment Ex Wife will be driving to appointments (or friends) Sheryle Donning  PLOF:  Level of assistance: Independent with ADLs, Independent with IADLs Employment: Retired; [employer]   PATIENT GOALS: memory, word finding, speech   OBJECTIVE:  Note: Objective measures were completed at Evaluation unless otherwise noted.  DIAGNOSTIC FINDINGS: Per Chart Review  CT HEAD CODE STROKE WO CONTRAST 08/11/23  IMPRESSION: 1.8 x 2.0 x 1.1 cm parenchymal hemorrhage centered in the left basal ganglia with intraventricular extension. No evidence of hydrocephalus at the time of exam. No significant midline shift.   Findings were paged to Dr. Murvin Arthurs on 08/11/23 at 2:29 PM     Electronically Signed   By: Clora Dane M.D.   On: 08/11/2023 14:31  COGNITION: Overall cognitive status: Impaired Areas of impairment:  Attention: Impaired: Selective, Alternating, Divided Memory: Impaired: Short term Prospective Executive function: Impaired: Problem solving, Organization, Planning,  and Slow processing Functional deficits: Reports difficulty with recall of important information  AUDITORY COMPREHENSION: Overall auditory comprehension: Appears intact YES/NO questions: Appears intact Following directions: Appears intact Conversation: Complex Interfering components: attention and processing speed Effective technique: extra processing time, pausing, and repetition/stressing words  READING COMPREHENSION: Intact  EXPRESSION: verbal  VERBAL EXPRESSION: Level of generative/spontaneous verbalization: conversation  Comments: Anomia  Non-verbal means of communication: N/A  WRITTEN EXPRESSION: Dominant hand: right Written expression: Not  tested  MOTOR SPEECH: Overall motor speech: impaired Level of impairment: Conversation Respiration:  WFL Phonation: normal Resonance: WFL Articulation: Appears intact; pt reports slurred speech when feeling tired Intelligibility: Intelligible; 100% intelligible at eval; reports worsening speech when fatigued Motor planning: Appears intact Motor speech errors:  NA Interfering components:  NA Effective technique: slow rate and over articulate  ORAL MOTOR EXAMINATION: Overall status: Impaired:   Labial: Right (Symmetry) Facial: Right (Symmetry) *ROM is good. At rest, facial droop on R side. Comments: NA   STANDARDIZED ASSESSMENTS: Initiated CLQT - to complete next session    Cognitive Linguistic Quick Test: AGE - 18 - 69   The Cognitive Linguistic Quick Test (CLQT) was administered to assess the relative status of five cognitive domains: attention, memory, language, executive functioning, and visuospatial skills. Scores from 10 tasks were used to estimate severity ratings (standardized for age groups 18-69 years and 70-89 years) for each domain, a clock drawing task, as well as an overall composite severity rating of cognition.       Task Score Criterion Cut Scores  Personal Facts 7/8 8  Symbol Cancellation 11/12 11  Confrontation Naming 10/10 10  Clock Drawing  11/13 12  Story Retelling 6/10 6  Symbol Trails 10/10 9  Generative Naming 3/9 5  Design Memory 6/6 5  Mazes  8/8 7  Design Generation 5/13 6     Cognitive Domain Composite Score Severity Rating  Attention 190/215 WNL  Memory 148/185 Mild  Executive Function 26/40 WNL  Language 26/37 Mild  Visuospatial Skills 95/105 WNL  Clock Drawing  11/13 Mild  Composite Severity Rating  WNL           PATIENT REPORTED OUTCOME MEASURES (PROM): Neuro QOL - Cognitive Short Form: 29                                                                                                                             TREATMENT  DATE:   11/28/23: Pt was seen for skilled ST services targeting cognitive-communication. SLP edu on using microphone speech-to-text to make notes to support memory. Pt reports he did not do any writing since he was last here - but he has been thinking about it. SLP included writing in today's therapeutic task re: creation of dysarthria BE CLEAR sentences. Pt was able to generate and produce sentences with intelligible speech independently. Pt's speech continues to be 100% intelligible; however, pt reports this is not the case when he is fatigued. He understands this  is when he should be most cognizant of using dysarthria strategies. Pt was tasked with writing 5, SLP-dictated, functional sentences. Pt required modA to spell words correctly. Pt was demonstrate use of speech to text to assist in error correction of sentences.   11/26/23: Pt was seen for skilled ST services targeting cognitive-communication. Pt reports he used "synonym" word finding strategy in an instance of anomia over the week. Pt reports he hasn't had any other significant difficulty with word finding. We reviewed "Be Clear" strategies for dysarthria re: speaking louder, over-articulating, slow down. SLP checked-in on pt's feelings regarding cognitive difficulties. He reports he hasn't noticed any significant problems; however, he realizes he has forgotten to bring his folder in x2. SLP suggested writing down tasks. Pt reported difficulty with writing from his previous strokes. SLP suggested using speech to text. Pt reported he has been leaving his cell phone at home. SLP encouraged him to always keep his cell nearby in case of emergencies, as well as, a memory tool. Pt attempted to write list of tasks needed to complete. Spelling errors noted. SLP encouraged pt to spell the word in his head prior to writing it down vs dividing attention between trying to figure out how to spell the word while attempting to write. Pt required max cue reminders to  first spell the word in his head before attempting to write. Pt was noted to reduce errors by ~50% using therapist strategy.   11/20/23: Pt was seen for skilled ST services targeting cognitive-communication and word finding strategies. Pt completed Cognitive PROMs measure - see above in blue. Pt reports he hasn't had any significant instances of anomia - given a 2-3 second delay, word typically pops out. Pt reports he is to go golfing (on the putting green) with a friend for his birthday. We discussed word finding strategies he could use in conversation. SLP facilitated word finding practice using multiple meaning words. Pt was tasked with finding the synonym (or a phrase close) for the given word. SLP challenged the patient to find synonyms to different golf terms in preparation for his outing. SLP briefly educated on "Be Clear" for dysarthria. To continue next session.   11/15/23: Pt was seen for skilled ST services targeting cognitive-communication and word finding strategies. SLP initiated education on word finding. SLP provided examples of each strategies and how to use in conversation. Pt reported he feels he would like to the following word finding strategies re: delay, describe, association, look it up. Pt was noted to have errors when reading aloud which was impacting his reading comprehension. Pt was noted to delete sounds from words  and add functor words while reading. SLP trialed re: pointing to each word with pen, re-reading aloud, re-reading silently to remove expressive component. To demonstrate "describe" strategy through Semantic Feature Analysis (SFA) next session.   11/09/23: Pt was seen for skilled ST services targeting completed cognitive-communication testing and education. SLP reviewed CLQT results with patient. He had no further questions. SLP provided pt with cognitive-communication handout. Pt reported difficulty with re: recalling details in conversations, understanding TV shows,  difficulty with expressing thoughts, and reading/writing.  Pt has residual aphasia from previous stroke. To provide strategies in this POC.   11/07/23: Provided brief education on cognitive-communication disorder and speech intelligibility.   PATIENT EDUCATION: Education details: SLP role in cognitive-communication impairments Person educated: Patient Education method: Explanation Education comprehension: needs further education   GOALS: Goals reviewed with patient? No; to update after further evaluation  SHORT TERM  GOALS: Target date: 12/08/23  Continue Cognitive testing to determine needs Baseline: Goal status: MET  2.  Complete PROMs measure Baseline:  Goal status: INITIAL  3.  Pt will recall 3 word finding strategies to use in instances of anomia with independently. Baseline:  Goal status: INITIAL  4.  Pt will verbalize 2 attention strategies to improve concentration during functional tasks.  Baseline:  Goal status: INITIAL  5. Pt will verbalize 2 memory strategies for recall of important information.  Baseline:  Goal status: INITIAL    LONG TERM GOALS: Target date: 01/08/24  Pt will improve score on PROMs measure Baseline:  Goal status: INITIAL  2.  Pt will report successful use of word finding strategies at home and in the community. Baseline:  Goal status: INITIAL  3.  Pt will report successful use of attention strategies at home and in the community. Baseline:  Goal status: INITIAL  4.  Pt will report successful use of memory strategies at home and in the community.  Baseline:  Goal status: INITIAL   ASSESSMENT:  CLINICAL IMPRESSION: Pt is a 68 yo male who presents to ST OP for evaluation post recent CVA in Jan. Pt lives alone and will be driven to appointments by ex-wife or friends. Pt endorses speech changes, in addition to sx associated with cognitive-communication challenges [id number] of new information, word finding, and slowed processing.  Pt was  assessed using CLQT - to complete next session. SLP observed difficulty with short term memory, instances of anomia, and very mild dysarthria. Pt was 100% intelligible in quiet, therapy room to an unfamiliar listener. He reports speech is worse when feeling tired. SLP to provide strategies for pt use in moments where speech may be unclear. Pt reports no difficulty with swallowing at this time and is tolerating a regular/thin diet. SLP rec skilled ST services to address cognitive-communication impairment and dysarthria to maximize functional independence and increase QOL.     OBJECTIVE IMPAIRMENTS: include memory, executive functioning, expressive language, and dysarthria. These impairments are limiting patient from effectively communicating at home and in community. Factors affecting potential to achieve goals and functional outcome are  NA . Patient will benefit from skilled SLP services to address above impairments and improve overall function.  REHAB POTENTIAL: Good  PLAN:  SLP FREQUENCY: 1-2x/week  SLP DURATION: 8 weeks  PLANNED INTERVENTIONS: Environmental controls, Cueing hierachy, Internal/external aids, Functional tasks, SLP instruction and feedback, Compensatory strategies, Patient/family education, and [id number] Treatment of speech (30 or 45 min)     [employer], CCC-SLP 11/28/2023, 9:34 AM

## 2023-11-28 NOTE — Therapy (Signed)
 OUTPATIENT OCCUPATIONAL THERAPY NEURO TREATMENT  Patient Name: Adrian [name] MRN: [id number] DOB:[birth year], 68 y.o., male Today's Date: 11/28/2023  PCP: Dr. Darren Em REFERRING PROVIDER: Dr. Sharl Davies  END OF SESSION:  OT End of Session - 11/28/23 1129     Visit Number 8    Number of Visits 25    Date for OT Re-Evaluation 01/23/24    Authorization Type Medicare    Authorization - Visit Number 8    Progress Note Due on Visit 10    OT Start Time 1102    OT Stop Time 1145    OT Time Calculation (min) 43 min                  Past Medical History:  Diagnosis Date   AF (paroxysmal atrial fibrillation) (HCC) 11/29/2018   Atrial flutter (HCC) 10/12/2015   a. TEE 3/17 with ? LAA clot-->s/p TEE/DCCV 11/24/2015; s/p DCCV 12/2022;s/p ablation 06/2023   CAD (coronary artery disease), native coronary artery 12/06/2015   a. 10/2015 MV: EF  37%, reversible defect inferior apex, intermediate risk findings; b. 10/2015 Cath: 20% mid RCA.   Carotid artery stenosis    1-39% right ICA stenosis and occluded left ICA   Colonic diverticular abscess    Diverticulitis    History of chemotherapy 2005   Cisplatin   Hypothyroidism    NICM (nonischemic cardiomyopathy) (HCC) 10/14/2015   a. Tachy mediated?;  b. Echo 3/17 - Mild concentric LVH, EF 30-35%, anteroseptal, anterior, anterolateral, apical anterior, lateral hypokinesis, trivial MR, mild to moderately reduced RVSF; c. LHC 3/17 - mRCA 20%   Radiation NOv.3,2005-Dec. 15, 2005   6810 cGy in 30 fractions   Tonsil cancer [facility]) 2005   Dr Ava Lei.  XRT   Past Surgical History:  Procedure Laterality Date   A-FLUTTER ABLATION N/A 06/19/2023   Procedure: A-FLUTTER ABLATION;  Surgeon: Boyce Byes, MD;  Location: [facility] INVASIVE CV LAB;  Service: Cardiovascular;  Laterality: N/A;   APPENDECTOMY N/A 03/07/2019   Procedure: ROBOTIC ASSISTED APPENDECTOMY;  Surgeon: Candyce Champagne, MD;  Location: [facility] ORS;  Service: General;  Laterality: N/A;    CARDIAC CATHETERIZATION N/A 10/18/2015   Procedure: Left Heart Cath and Coronary Angiography;  Surgeon: Odie Benne, MD;  Location: [facility] INVASIVE CV LAB;  Service: Cardiovascular;  Laterality: N/A;   CARDIOVERSION N/A 11/24/2015   Procedure: CARDIOVERSION;  Surgeon: Loyde Rule, MD;  Location: [facility] ENDOSCOPY;  Service: Cardiovascular;  Laterality: N/A;   CARDIOVERSION N/A 12/20/2022   Procedure: CARDIOVERSION;  Surgeon: Bridgette Campus, MD;  Location: [facility] INVASIVE CV LAB;  Service: Cardiovascular;  Laterality: N/A;   CYSTOSCOPY WITH STENT PLACEMENT Bilateral 03/07/2019   Procedure: CYSTOSCOPY WITH BILATERAL FIREFLY INJECTION;  Surgeon: Andrez Banker, MD;  Location: [facility] ORS;  Service: Urology;  Laterality: Bilateral;   GASTROSTOMY TUBE PLACEMENT  07/04/2004   IR - G tube for tonsilar cancer   IR ANGIO INTRA EXTRACRAN SEL COM CAROTID INNOMINATE UNI R MOD SED  03/09/2019   IR ANGIO VERTEBRAL SEL VERTEBRAL UNI R MOD SED  03/09/2019   IR CT HEAD LTD  03/09/2019   IR PERCUTANEOUS ART THROMBECTOMY/INFUSION INTRACRANIAL INC DIAG ANGIO  03/09/2019   IR RADIOLOGIST EVAL & MGMT  12/18/2018   LAMINECTOMY     C5/placement of steel plate   LOOP RECORDER INSERTION N/A 06/19/2023   Procedure: LOOP RECORDER INSERTION;  Surgeon: Boyce Byes, MD;  Location: [facility] INVASIVE CV LAB;  Service: Cardiovascular;  Laterality: N/A;   NECK SURGERY  2003   replaced disk   RADIOLOGY WITH ANESTHESIA N/A 03/08/2019   Procedure: IR WITH ANESTHESIA;  Surgeon: Luellen Sages, MD;  Location: [facility] OR;  Service: Radiology;  Laterality: N/A;   TEE WITHOUT CARDIOVERSION N/A 10/15/2015   Procedure: TRANSESOPHAGEAL ECHOCARDIOGRAM (TEE);  Surgeon: Darlis Eisenmenger, MD;  Location: [facility] ENDOSCOPY;  Service: Cardiovascular;  Laterality: N/A;   TEE WITHOUT CARDIOVERSION N/A 11/24/2015   Procedure: TRANSESOPHAGEAL ECHOCARDIOGRAM (TEE);  Surgeon: Loyde Rule, MD;  Location: [facility] ENDOSCOPY;  Service: Cardiovascular;  Laterality: N/A;   TEE  WITHOUT CARDIOVERSION N/A 12/20/2022   Procedure: TRANSESOPHAGEAL ECHOCARDIOGRAM;  Surgeon: Bridgette Campus, MD;  Location: [facility] INVASIVE CV LAB;  Service: Cardiovascular;  Laterality: N/A;   XI ROBOTIC ASSISTED COLOSTOMY TAKEDOWN N/A 03/07/2019   Procedure: XI ROBOTIC ASSISTED LOW ANTERIOR RESECTION, RIGID PROCTOSCOPY;  Surgeon: Candyce Champagne, MD;  Location: [facility] ORS;  Service: General;  Laterality: N/A;   Patient Active Problem List   Diagnosis Date Noted   Carotid occlusion, left 10/23/2023   ICH (intracerebral hemorrhage) (HCC) 08/11/2023   Carotid artery stenosis    Atrial flutter (HCC) 12/19/2022   Essential hypertension 04/22/2021   Hypokalemia 03/12/2019   Expressive aphasia 03/10/2019   Dysphagia 03/10/2019   Middle cerebral artery embolism, left 03/09/2019   Diverticular stricture (HCC) 03/07/2019   Bacteria in urine    Chronic atrial fibrillation (HCC) 12/02/2018   Current use of long term anticoagulation 12/02/2018   UTI (urinary tract infection) 11/29/2018   Hyponatremia 11/29/2018   PAF (paroxysmal atrial fibrillation) (HCC) 11/29/2018   Diverticulitis of large intestine with abscess 09/23/2018   Dyslipidemia 12/05/2017   CAD (coronary artery disease), native coronary artery 12/06/2015   SOB (shortness of breath)    NICM (nonischemic cardiomyopathy) (HCC)    Thyroid  activity decreased    Hypothyroidism 08/12/2013   History of radiation therapy 05/30/2012   Smokes tobacco daily 05/30/2012   Cancer of tonsillar fossa (HCC) 11/27/2011   Tonsil cancer (HCC) 2005   History of chemotherapy 2005    ONSET DATE: 08/11/23  REFERRING DIAG: I61.0 (ICD-10-CM) - Nontraumatic subcortical hemorrhage of left cerebral hemisphere (HCC)   THERAPY DIAG:  Other abnormalities of gait and mobility  Muscle weakness (generalized)  Other lack of coordination  Frontal lobe and executive function deficit  Attention and concentration deficit  Rationale for Evaluation and Treatment:  Rehabilitation  SUBJECTIVE:   SUBJECTIVE STATEMENT: Pt reports forgetting things  Pt accompanied by: self  PERTINENT HISTORY:   68 yo male hospitalized 08/11/23 with R sided weakness. CT showed a large basal ganglia ICH with IVH extension. PMH includes:  A-fib, carotid artery stenosis, CAD, tonsil cancer, hypothyroidism, L MCA CVA.    PRECAUTIONS: Fall  WEIGHT BEARING RESTRICTIONS: No  PAIN:  Are you having pain? No  FALLS: Has patient fallen in last 6 months? No  LIVING ENVIRONMENT: Lives with: lives alone has friends and ex wife checks in on him Lives in: House/apartment Stairs: no Has following equipment at home: Single point cane and Tour manager  PLOF: Independent  PATIENT GOALS: improve use of RUE  OBJECTIVE:  Note: Objective measures were completed at Evaluation unless otherwise noted.  HAND DOMINANCE: Right  ADLs: Overall ADLs: mod I with all basic ADLS Transfers/ambulation related to ADLs: Eating: uses RUE 20%, currently using non dominant LUE,  Grooming: using non dominant LUE UB Dressing: mod I difficulty with buttons LB Dressing: mod I Toileting: mod I with cane Bathing: has tub bench Tub Shower transfers: mod I  Equipment: Transfer tub bench  IADLs: Shopping: needs assist for transportation Light housekeeping: Pt is performing light cleaning, he has occasional assist Meal Prep: microwave meals primarily, does a little stove top Medication management: pt handles  Financial management: pt handles Handwriting: 90% legibility, handwriting is small   MOBILITY STATUS:  mod I with cane  ACTIVITY TOLERANCE: Activity tolerance: standing  UPPER EXTREMITY ROM:  grossly WFLS  UPPER EXTREMITY MMT:     MMT Right eval Left eval  Shoulder flexion 3+/5 4+/5  Shoulder abduction    Shoulder adduction    Shoulder extension    Shoulder internal rotation    Shoulder external rotation    Middle trapezius    Lower trapezius    Elbow flexion 4-/5 4-/5   Elbow extension 4-/5 4-/5  Wrist flexion    Wrist extension    Wrist ulnar deviation    Wrist radial deviation    Wrist pronation    Wrist supination    (Blank rows = not tested)  HAND FUNCTION: Grip strength: Right: 50 lbs; Left: 63 lbs  COORDINATION: 9 Hole Peg test: Right: 59.69 sec; Left: 29.35 sec  SENSATION:diminished in RUE  COGNITION: Overall cognitive status:unable to spell WORLD backwards correctly, recalls 2/3 words following a delay.  VISION ASSESSMENT: Not tested- denies visual changes   OBSERVATIONS: Pleasant gentleman motivated to improve                                                                                                                              TREATMENT DATE: 11/28/23-UBE x 6 mins level 2 for conditioning, min v.c for speed and balanced use of UE's Mosaic picture design to pick up various shaped pieces and manipulate to place in frame while completing a design for increased fine motor coordination with a cognitve component, min difficulty, min v.c for in hand manipulation and to avoid compensatory patterns. Increased time required for this task. Education provided regarding memory compensations.  11/26/23- UBE x 6 mins level 3 for conditioning Copying small peg design with RUE for increased fine motor coordination with a cognitive component, min difficulty, increased time required, v.c  Reveiwed previously issued yellow theraband exercises, 10- 20 reps each, min v.c shoulder flexion with foam roll 15 reps to eye level with improved control Followed by shoulder flexion low to mid range with 1 lbs weight in each hand focus on control  11/21/23- Pt practiced placing screwing bolts into pegboard with RUE then using tools such as screw driver, Customer service manager and wrench to tighten items with RUE min-mod v.c, pt also practiced locking and unlocking a pad lock. Pt with mod difficulty using wrench, min difficulty for using screw driver. UBE x5 mins level 1  for conditioning  11/19/23 Closed chain shoulder flexion with foam roll 2 sets of 10 reps min v.c and facilitation for shoulder positioning and focus on control. Reviewed yellow theraband exercises 15 reps each bilateral UE's. Pt performed biceps  curls  with bilateral UE's simultaneously with cues to have RUE mirror movements of LUE, min v.c Placing grooved pegs into pegboard with RUE for increased fine motor coordination, min difficulty/ v.c  Removing pegs with tweezers, for sustained pinch for 1/2 the items,  mod difficulty/ drops   11/15/23 UBE x 6 mins level 1 for conditioning closed chain shoulder flexion with medium ball x 15 reps then with foam roll for send set min v.c for control. Yellow theraband for biceps curls and triceps extension, 10-15 reps each UE, min v.c copying small peg design with RUE min-mod difficulty/ drops, min v.c to avoid compensation.  11/12/23- Reviewed cane HEP 10-15 reps min v.c Functional reaching to place and retrieve clothespins(1-8#) from overhead target(vertical yardstick) with RUE for functional reach and sustained pinch, min difficulty, v.c Placing metal pegs( 3 sizes) into pegboard with RUE then removing with in hand manipulation, min difficulty/ v.c  Flipping and dealing cards with RUE for increased fine motor coordination, min v.c   11/09/23: Pt instructed in cane HEP.  Pt fatigued quickly and with difficulty with control, but responded well to cueing.  Also reviewed and added to coordination HEP.  Pt able to copy shopping list with good legibility, but several spelling errors--pt reports difficulty with spelling if generating a list at home.   10/31/23- eval     PATIENT EDUCATION: Education details: 11/28/23- memory compensations, recommendation that pt does not drive until cleared by MD then graduated driving program with someone recommended vs. driving eval Person educated: Patient Education method: Explanation, Demonstration, Tactile cues, Verbal cues,  and Handouts Education comprehension: verbalized understanding, returned demonstration, and verbal cues required  HOME EXERCISE PROGRAM: beginning coordination 3/26 11/09/23  Cane HEP 4/10- biceps triceps with yellow band, shoulder flexion with paper towel roll  11/27/23- memory compensations GOALS: Goals reviewed with patient? Yes  SHORT TERM GOALS: Target date: 12/01/23  I with inital HEP Goal status: met for cane  2.  Pt will report feeding himself at least 50% of the time with RUE. Baseline: uses 20% x Goal status:  met, per pt report 78% 11/28/23  3.  Pt will demonstrate improved RUE fine motor coordination for ADLs as evidenced by decreasing 9 hole peg test score to 52 secs or less Goal status: ongoing 11/28/23  4.  Pt will report consistently brushing his teeth with RUE Goal status: ongoing, burshes teeth with RUE 50% of the time 11/28/23  5.  I with memory compensation strategies Goal status: met, issued 11/28/23  6.  Pt will report that he has resumed moderate level home management mod I Goal status: ongoing, 11/28/22   LONG TERM GOALS: Target date: 01/23/24  I with updated HEP Goal status: INITIAL  2.  Pt will demonstrate improved RUE fine motor coordination for ADLs as evidenced by decreasing 9 hole peg test score to 48 secs or less Goal status: INITIAL  3.  Pt will demonstrate ability to carry a plate in RUE without spills. Goal status: INITIAL  4.  Pt will write a short paragraph with 100% legibility and minimal decrease in letter size. Goal status: INITIAL  5.  Pt will resume use of RUE as dominant hand at least 75% of the time for ADLs/ IADLs. Goal status: INITIAL  6. Pt will retrieve a 3 lbs object from eye level shelf using RUE with good control  Goal status: inital  ASSESSMENT:  CLINICAL IMPRESSION: Pt is progressing towards goals. Pt demonstrates improving fine motor coordination. Pt verbalizes understanding of  memory compensation  strateiges.Aaron AasPERFORMANCE DEFICITS: in functional skills including ADLs, IADLs, coordination, dexterity, sensation, strength, flexibility, Fine motor control, Gross motor control, mobility, balance, endurance, decreased knowledge of precautions, decreased knowledge of use of DME, and UE functional use, cognitive skills including attention, memory, problem solving, safety awareness, and thought, and psychosocial skills including coping strategies, environmental adaptation, habits, interpersonal interactions, and routines and behaviors.   IMPAIRMENTS: are limiting patient from ADLs, IADLs, play, leisure, and social participation.   CO-MORBIDITIES: may have co-morbidities  that affects occupational performance. Patient will benefit from skilled OT to address above impairments and improve overall function.  MODIFICATION OR ASSISTANCE TO COMPLETE EVALUATION: No modification of tasks or assist necessary to complete an evaluation.  OT OCCUPATIONAL PROFILE AND HISTORY: Detailed assessment: Review of records and additional review of physical, cognitive, psychosocial history related to current functional performance.  CLINICAL DECISION MAKING: LOW - limited treatment options, no task modification necessary  REHAB POTENTIAL: Good  EVALUATION COMPLEXITY: Low    PLAN:  OT FREQUENCY: 2x/week  OT DURATION: 12 weeks  PLANNED INTERVENTIONS: 97168 OT Re-evaluation, 97535 self care/ADL training, 09811 therapeutic exercise, 97530 therapeutic activity, 97112 neuromuscular re-education, 97140 manual therapy, 97113 aquatic therapy, 97035 ultrasound, 97018 paraffin, 91478 moist heat, 97010 cryotherapy, 97034 contrast bath, 97014 electrical stimulation unattended, 97129 Cognitive training (first 15 min), 29562 Cognitive training(each additional 15 min), 13086 Orthotics management and training, 57846 Splinting (initial encounter), passive range of motion, balance training, functional mobility training, energy  conservation, coping strategies training, patient/family education, and DME and/or AE instructions  RECOMMENDED OTHER SERVICES: n/a  CONSULTED AND AGREED WITH PLAN OF CARE: Patient  PLAN FOR NEXT SESSION:   continue to address, UE strength, coordination and functional use   Sarahgrace Broman, OTR/L 11/28/2023, 12:09 PM

## 2023-11-28 NOTE — Patient Instructions (Addendum)

## 2023-11-28 NOTE — Therapy (Signed)
 OUTPATIENT PHYSICAL THERAPY TREATMENT   Patient Name: Adrian [name] MRN: [id number] DOB:[birth year], 68 y.o., male Today's Date: 11/28/2023  END OF SESSION:  PT End of Session - 11/28/23 1101     Visit Number 9    Number of Visits 17    Date for PT Re-Evaluation 12/26/23    Authorization Type MCR    Authorization Time Period 10/31/23 to 12/26/23    Progress Note Due on Visit 10    PT Start Time 1017    PT Stop Time 1057    PT Time Calculation (min) 40 min    Activity Tolerance Patient tolerated treatment well    Behavior During Therapy [facility] for tasks assessed/performed                     Past Medical History:  Diagnosis Date   AF (paroxysmal atrial fibrillation) (HCC) 11/29/2018   Atrial flutter (HCC) 10/12/2015   a. TEE 3/17 with ? LAA clot-->s/p TEE/DCCV 11/24/2015; s/p DCCV 12/2022;s/p ablation 06/2023   CAD (coronary artery disease), native coronary artery 12/06/2015   a. 10/2015 MV: EF  37%, reversible defect inferior apex, intermediate risk findings; b. 10/2015 Cath: 20% mid RCA.   Carotid artery stenosis    1-39% right ICA stenosis and occluded left ICA   Colonic diverticular abscess    Diverticulitis    History of chemotherapy 2005   Cisplatin   Hypothyroidism    NICM (nonischemic cardiomyopathy) (HCC) 10/14/2015   a. Tachy mediated?;  b. Echo 3/17 - Mild concentric LVH, EF 30-35%, anteroseptal, anterior, anterolateral, apical anterior, lateral hypokinesis, trivial MR, mild to moderately reduced RVSF; c. LHC 3/17 - mRCA 20%   Radiation NOv.3,2005-Dec. 15, 2005   6810 cGy in 30 fractions   Tonsil cancer [facility]) 2005   Dr Ava Lei.  XRT   Past Surgical History:  Procedure Laterality Date   A-FLUTTER ABLATION N/A 06/19/2023   Procedure: A-FLUTTER ABLATION;  Surgeon: Boyce Byes, MD;  Location: [facility] INVASIVE CV LAB;  Service: Cardiovascular;  Laterality: N/A;   APPENDECTOMY N/A 03/07/2019   Procedure: ROBOTIC ASSISTED APPENDECTOMY;  Surgeon: Candyce Champagne, MD;  Location: [facility] ORS;  Service: General;  Laterality: N/A;   CARDIAC CATHETERIZATION N/A 10/18/2015   Procedure: Left Heart Cath and Coronary Angiography;  Surgeon: Odie Benne, MD;  Location: [facility] INVASIVE CV LAB;  Service: Cardiovascular;  Laterality: N/A;   CARDIOVERSION N/A 11/24/2015   Procedure: CARDIOVERSION;  Surgeon: Loyde Rule, MD;  Location: [facility] ENDOSCOPY;  Service: Cardiovascular;  Laterality: N/A;   CARDIOVERSION N/A 12/20/2022   Procedure: CARDIOVERSION;  Surgeon: Bridgette Campus, MD;  Location: [facility] INVASIVE CV LAB;  Service: Cardiovascular;  Laterality: N/A;   CYSTOSCOPY WITH STENT PLACEMENT Bilateral 03/07/2019   Procedure: CYSTOSCOPY WITH BILATERAL FIREFLY INJECTION;  Surgeon: Andrez Banker, MD;  Location: [facility] ORS;  Service: Urology;  Laterality: Bilateral;   GASTROSTOMY TUBE PLACEMENT  07/04/2004   IR - G tube for tonsilar cancer   IR ANGIO INTRA EXTRACRAN SEL COM CAROTID INNOMINATE UNI R MOD SED  03/09/2019   IR ANGIO VERTEBRAL SEL VERTEBRAL UNI R MOD SED  03/09/2019   IR CT HEAD LTD  03/09/2019   IR PERCUTANEOUS ART THROMBECTOMY/INFUSION INTRACRANIAL INC DIAG ANGIO  03/09/2019   IR RADIOLOGIST EVAL & MGMT  12/18/2018   LAMINECTOMY     C5/placement of steel plate   LOOP RECORDER INSERTION N/A 06/19/2023   Procedure: LOOP RECORDER INSERTION;  Surgeon: Boyce Byes, MD;  Location: [facility] INVASIVE CV LAB;  Service: Cardiovascular;  Laterality: N/A;   NECK SURGERY  2003   replaced disk   RADIOLOGY WITH ANESTHESIA N/A 03/08/2019   Procedure: IR WITH ANESTHESIA;  Surgeon: Luellen Sages, MD;  Location: [facility] OR;  Service: Radiology;  Laterality: N/A;   TEE WITHOUT CARDIOVERSION N/A 10/15/2015   Procedure: TRANSESOPHAGEAL ECHOCARDIOGRAM (TEE);  Surgeon: Darlis Eisenmenger, MD;  Location: [facility] ENDOSCOPY;  Service: Cardiovascular;  Laterality: N/A;   TEE WITHOUT CARDIOVERSION N/A 11/24/2015   Procedure: TRANSESOPHAGEAL ECHOCARDIOGRAM (TEE);  Surgeon: Loyde Rule, MD;   Location: [facility] ENDOSCOPY;  Service: Cardiovascular;  Laterality: N/A;   TEE WITHOUT CARDIOVERSION N/A 12/20/2022   Procedure: TRANSESOPHAGEAL ECHOCARDIOGRAM;  Surgeon: Bridgette Campus, MD;  Location[facility] INV[facility] CV LAB;  Service: Cardiovascular;  Laterality: N/A;   XI ROBOTIC ASSISTED COLOSTOMY TAKEDOWN N/A 03/07/2019   Procedure: XI ROBOTIC ASSISTED LOW ANTERIOR RESECTION, RIGID PROCTOSCOPY;  Surgeon: Candyce Champagne, MD;  Location: [facility] ORS;  Service: General;  Laterality: N/A;   Patient Active Problem List   Diagnosis Date Noted   Carotid occlusion, left 10/23/2023   ICH (intracerebral hemorrhage) (HCC) 08/11/2023   Carotid artery stenosis    Atrial flutter (HCC) 12/19/2022   Essential hypertension 04/22/2021   Hypokalemia 03/12/2019   Expressive aphasia 03/10/2019   Dysphagia 03/10/2019   Middle cerebral artery embolism, left 03/09/2019   Diverticular stricture (HCC) 03/07/2019   Bacteria in urine    Chronic atrial fibrillation (HCC) 12/02/2018   Current use of long term anticoagulation 12/02/2018   UTI (urinary tract infection) 11/29/2018   Hyponatremia 11/29/2018   PAF (paroxysmal atrial fibrillation) (HCC) 11/29/2018   Diverticulitis of large intestine with abscess 09/23/2018   Dyslipidemia 12/05/2017   CAD (coronary artery disease), native coronary artery 12/06/2015   SOB (shortness of breath)    NICM (nonischemic cardiomyopathy) (HCC)    Thyroid  activity decreased    Hypothyroidism 08/12/2013   History of radiation therapy 05/30/2012   Smokes tobacco daily 05/30/2012   Cancer of tonsillar fossa (HCC) 11/27/2011   Tonsil cancer (HCC) 2005   History of chemotherapy 2005    PCP: Glean Lamy MD   REFERRING PROVIDER: Genetta Kenning, MD  REFERRING DIAG: I61.0 (ICD-10-CM) - Nontraumatic subcortical hemorrhage of left cerebral hemisphere [facility])  THERAPY DIAG:  Other abnormalities of gait and mobility  Unsteadiness on feet  Muscle weakness (generalized)  Rationale  for Evaluation and Treatment: Rehabilitation  ONSET DATE: 08/11/23  SUBJECTIVE:   SUBJECTIVE STATEMENT:   Balance is still hardest for me, want to get better   PERTINENT HISTORY: 1.  History of left basal ganglia hemorrhage approximately 2 and half months ago.  He has completed inpatient rehabilitation as well as home health.  I do think he would be a great candidate for outpatient therapy.  He has gotten back to modified independent level but still has some mild weakness on the right side as well as fine motor issues and motor control issues.  His balance is also off.  He continues wear right AFO because of foot and ankle weakness.  Patient has some short-term memory issues as well Will make referral for outpatient PT OT and speech therapy Physical medicine rehab follow-up in 3 months  PAIN:  Are you having pain? No  0/10  PRECAUTIONS: Fall and Other: R hemi, has loop recorder   RED FLAGS: None   WEIGHT BEARING RESTRICTIONS: No  FALLS:  Has patient fallen in last 6 months? No  LIVING ENVIRONMENT: Lives  with: lives alone Lives in: House/apartment Stairs: 2 STE with rail  Has following equipment at home: Single point cane and Environmental consultant - 2 wheeled  OCCUPATION: retired- used to work for [employer]   PLOF: Independent, Independent with basic ADLs, Independent with gait, Independent with transfers, and Requires assistive device for independence  PATIENT GOALS: walk straight without a cane, get back to driving and motorcycling   NEXT MD VISIT: Referring 01/25/24  OBJECTIVE:  Note: Objective measures were completed at Evaluation unless otherwise noted.  DIAGNOSTIC FINDINGS:   CLINICAL DATA:  Stroke, follow up Neuro deficit, acute, stroke suspected   EXAM: MRI HEAD WITHOUT AND WITH CONTRAST   TECHNIQUE: Multiplanar, multiecho pulse sequences of the brain and surrounding structures were obtained without and with intravenous contrast.   CONTRAST:  6.5mL GADAVIST  GADOBUTROL  1  MMOL/ML IV SOLN   COMPARISON:  CT head from today.   FINDINGS: Brain: When comparing across modalities, no substantial change in intraparenchymal hemorrhage in the left basal ganglia with intraventricular extension. No visible surrounding acute hemorrhage or mass lesion; however, acute blood products limits assessment. Prior left frontal infarct with encephalomalacia. No hydrocephalus. No pathologic enhancement.   Vascular: Major arterial flow voids are maintained at the skull base.   Skull and upper cervical spine: Normal marrow signal.   Sinuses/Orbits: Right frontal sinus opacification. Remaining sinuses are clear. No acute orbital findings.   Other: No mastoid effusions.   IMPRESSION: 1. When comparing across modalities, no substantial change in intraparenchymal hemorrhage in the left basal ganglia with intraventricular extension. No progressive mass effect. 2. No visible surrounding acute hemorrhage or mass lesion; however, acute blood products limits assessment  EXAM: CT HEAD WITHOUT CONTRAST   TECHNIQUE: Contiguous axial images were obtained from the base of the skull through the vertex without intravenous contrast.   RADIATION DOSE REDUCTION: This exam was performed according to the departmental dose-optimization program which includes automated exposure control, adjustment of the mA and/or kV according to patient size and/or use of iterative reconstruction technique.   COMPARISON:  Head CT 08/12/2023   FINDINGS: Brain: Interval decrease in size of the previously seen hemorrhage in the left basal ganglia, now measuring 10 x 7 mm, previously 20 x 12 mm. There is resolution of intraventricular blood products. No hydrocephalus. No extra-axial fluid collection. No mass effect. No mass lesion. No CT evidence of an acute cortical infarct. There is chronic left MCA territory infarct involving the left opercular region.   Vascular: No hyperdense vessel or  unexpected calcification.   Skull: Normal. Negative for fracture or focal lesion.   Sinuses/Orbits: No middle ear or mastoid effusion. Paranasal sinuses are notable for complete opacification of the right frontal sinus. Orbits are unremarkable.   Other: None.   IMPRESSION: No acute intracranial abnormality. Interval decrease in size of left basal ganglia hemorrhage, now measuring 10 x 7 mm, previously 20 x 12 mm. Resolution of intraventricular blood products. No hydrocephalus.    PATIENT SURVEYS:    Patient-Specific Activity Scoring Scheme  "0" represents "unable to perform." "10" represents "able to perform at prior level. 0 1 2 3 4 5 6 7 8 9  10 (Date and Score)   Activity Eval     1. Walking straight without the cane  4     2. Strength in RLE   4    3. Coordinated movements of R LE  3   4.    5.    Score 3.7    Total score = sum  of the activity scores/number of activities Minimum detectable change (90%CI) for average score = 2 points Minimum detectable change (90%CI) for single activity score = 3 points     COGNITION: Overall cognitive status: Within functional limits for tasks assessed     SENSATION: Not tested      LOWER EXTREMITY MMT:  MMT Right eval Left eval  Hip flexion 4 5  Hip extension    Hip abduction    Hip adduction    Hip internal rotation    Hip external rotation    Knee flexion 4 5  Knee extension 4 5  Ankle dorsiflexion 1 at best (in AFO, reports no movement in ankle)   Ankle plantarflexion    Ankle inversion    Ankle eversion     (Blank rows = not tested)    FUNCTIONAL TESTS:  5 times sit to stand: 12.8 seconds some off shift from R LE  Timed up and go (TUG): deferred  3 minute walk test: 248ft [facility]     11/28/23 0001  Dynamic Gait Index  Level Surface 2  Change in Gait Speed 2  Gait with Horizontal Head Turns 2  Gait with Vertical Head Turns 2  Gait and Pivot Turn 2  Step Over Obstacle 2  Step Around Obstacles 2   Steps 1  Total Score 15     GAIT: Distance walked: 229ft  Assistive device utilized: Single point cane Level of assistance: Modified independence Comments: wide BOS, increased tone in R LE with gait pattern- limited knee ROM and ankle tends to invert a bit , mild unsteadiness with corners, limited rotation at trunk/hips                                                                                                                                 TREATMENT DATE:   11/28/23   Side step, step over forward and backward on blue foam pad in // bars  DGI re-test  Tandem walk with lateral toe tap each step, alternating blue foam pad // bars     STS with yellow ball in goblet hold x10, cues to avoid offshift from hemi side- placed R foot back/L foot forward for majority of reps  Nustep L6x8 minutes all four extremities for reciprocal movement and functional activity tolerance  Heel toe steps on blue air pad for assist with rocking motion x10 B, cues to normalize heel-toe and swing through, followed by gait training with focus on heel-toe pattern and keeping hips level       11/26/23:  Therapeutic activities and neuromuscular re education: Gait outdoors with st cane  Airex heel /toe rocks Airex L foot forward, R foot back semi tandem static holds 30 sec Airex L foot forward, R foot back, semi tandem with 2# therabar forward punches, then B shoulder flex, then rotations each side  Leg press, 30# B 20x Leg press R LE only 30# 20 x 2  High marching 100' with st cane  Seated hamstring curls 35#, B   11/21/23:  Therapeutic activities and Neuromuscular reeducation:  Gait outdoors, x 7 min with st cane on asphalt, mild incline, curb , with SBA by PT In ll bars for B heel raises  In ll bars for staggered standing with L foot on airex In ll bars alt toe taps to 2 cones  In ll bars for 3 way hip L LE Then in ll bars for towel slides L Le with R wbing emphasis on maintaining upright R hip  position, tends to sink, trendelenburg to R and hyperextend R knee which may be contributing to catching R toe, mass practice  Leg press, 30# B 20x Leg press R LE only 30# 20 x 2 Marching with walking, 20' x 2 with st cane      11/20/23  Forward step ups 4 inch step x15 B Lateral step ups 4 inch step x15 B Nustep L5x4 minutes all four extremities, then another 4 minutes at L6  STS green band above knees x10 cues for equal  Forward and backward step overs  over half foam roll with U UE support (HEP practice)   Side steps blue foam pad x3 rounds up to MinA for balance  Lateral side steps over half foam rolls x3 laps difficulty with foot clearance         PATIENT EDUCATION:  Education details: exam findings, POC, HEP, otherwise as above  Person educated: Patient Education method: Programmer, multimedia, Demonstration, and Handouts Education comprehension: verbalized understanding, returned demonstration, and needs further education  HOME EXERCISE PROGRAM:  Access Code: [email or web address] URL: [email or web address] Date: 11/20/2023 Prepared by: Terrel Ferries  Exercises - Heel Toe Raises with Counter Support  - 1 x daily - 7 x weekly - 2 sets - 10 reps - Stride Stance Weight Shift  - 1 x daily - 7 x weekly - 2 sets - 10 reps - Single Leg Balance with Clock Reach  - 1 x daily - 7 x weekly - 2 sets - 10 reps - Sit to Stand with Resistance Around Legs  - 1 x daily - 7 x weekly - 2 sets - 10 reps - Forward Step Over with Counter Support  - 1 x daily - 7 x weekly - 2 sets - 10 reps - Backward Step Over with Counter Support  - 1 x daily - 7 x weekly - 2 sets - 10 reps   ASSESSMENT:  CLINICAL IMPRESSION:             Pt arrives today doing well, he is making good progress towards STG at this point. Still working on countering compensation patterns after his stroke- tends to offshift from hemi side and using momentum from trunk to advance hemi LE with gait. Very motivated to improve,  balance remains a large concern of his.    From eval: Patient is a 68 y.o. M who was seen today for physical therapy evaluation and treatment for I61.0 (ICD-10-CM) - Nontraumatic subcortical hemorrhage of left cerebral hemisphere [facility]). He is very motivated and moving a very high level given the hemorrhagic CVA. His main concerns are balance/coordination and getting back to walking without assistive device. Anticipate he will do very well with skilled PT services.   OBJECTIVE IMPAIRMENTS: Abnormal gait, decreased activity tolerance, decreased balance, decreased coordination, decreased knowledge of use of DME, decreased mobility, difficulty walking, decreased strength, and decreased safety awareness.   ACTIVITY LIMITATIONS: standing, squatting, stairs, transfers, and  locomotion level  PARTICIPATION LIMITATIONS: driving, shopping, community activity, and yard work  PERSONAL FACTORS: Age, Behavior pattern, Fitness, Past/current experiences, Social background, Time since onset of injury/illness/exacerbation, and Transportation are also affecting patient's functional outcome.   REHAB POTENTIAL: Good  CLINICAL DECISION MAKING: Evolving/moderate complexity  EVALUATION COMPLEXITY: Moderate   GOALS: Goals reviewed with patient? No  SHORT TERM GOALS: Target date: 11/28/2023   Will be compliant with appropriate progressive HEP  Baseline: Goal status: MET 11/28/23  2.  Will score 14/24 on DGI  Baseline: 10 Goal status: MET 11/28/23 15/24  3.  Will be able to perform all functional transfers without compensation strategies or offshift from hemi LE  Baseline:  Goal status: ONGOING 11/28/23- progressing well but still present   4.  Will be able to ambulate household distances without AD, Mod(I) Baseline:  Goal status: ONGOING 11/28/23 still using cane 90% of the time     LONG TERM GOALS: Target date: 12/26/2023    MMT in R LE to be at least 4+/5 in hip and knee musculature  Baseline:  Goal  status: INITIAL  2.  Will score 18/24 on DGI to show improved functional balance  Baseline:  Goal status: INITIAL  3.  Will be able to walk at least 254ft over uneven/unsteady surfaces without device and no more than S/ 531ft over even surfaces no device Mod(I) Baseline:  Goal status: INITIAL  4.  Will be able to ascend and descend steps without rail and minimal compensation strategies  Baseline:  Goal status: INITIAL  5.  Will be compliant with advanced HEP vs gym based program at DC to maintain functional gains  Baseline:  Goal status: INITIAL  6.  PSFS to improve by at least 3 points  Baseline: 3.7 Goal status: INITIAL   PLAN:  PT FREQUENCY: 2x/week  PT DURATION: 8 weeks  PLANNED INTERVENTIONS: [id number]- PT Re-evaluation, 97110-Therapeutic exercises, 97530- Therapeutic activity, 97112- Neuromuscular re-education, 97535- Self Care, [id number]- Manual therapy, [id number]- Gait training, [phone]- Orthotic Fit/training, [phone]- Aquatic Therapy, Stair training, Taping, Dry Needling, Cryotherapy, and Moist heat  PLAN FOR NEXT SESSION: strength, balance, gait training, functional conditioning. Motor control tasks/progressions for R LE. Keep working on foot clearance and normalizing gait pattern   Terrel Ferries, PT, DPT 11/28/23 11:01 AM

## 2023-12-03 ENCOUNTER — Ambulatory Visit: Admitting: Physical Therapy

## 2023-12-03 ENCOUNTER — Ambulatory Visit: Admitting: Speech Pathology

## 2023-12-03 ENCOUNTER — Ambulatory Visit: Admitting: Occupational Therapy

## 2023-12-03 ENCOUNTER — Encounter: Payer: Self-pay | Admitting: Speech Pathology

## 2023-12-03 ENCOUNTER — Encounter: Payer: Self-pay | Admitting: Physical Therapy

## 2023-12-03 DIAGNOSIS — M6281 Muscle weakness (generalized): Secondary | ICD-10-CM

## 2023-12-03 DIAGNOSIS — R278 Other lack of coordination: Secondary | ICD-10-CM

## 2023-12-03 DIAGNOSIS — R41844 Frontal lobe and executive function deficit: Secondary | ICD-10-CM

## 2023-12-03 DIAGNOSIS — R41840 Attention and concentration deficit: Secondary | ICD-10-CM

## 2023-12-03 DIAGNOSIS — R41841 Cognitive communication deficit: Secondary | ICD-10-CM

## 2023-12-03 DIAGNOSIS — R2689 Other abnormalities of gait and mobility: Secondary | ICD-10-CM

## 2023-12-03 DIAGNOSIS — R2681 Unsteadiness on feet: Secondary | ICD-10-CM

## 2023-12-03 DIAGNOSIS — R4701 Aphasia: Secondary | ICD-10-CM

## 2023-12-03 DIAGNOSIS — R262 Difficulty in walking, not elsewhere classified: Secondary | ICD-10-CM

## 2023-12-03 NOTE — Therapy (Signed)
 OUTPATIENT SPEECH LANGUAGE PATHOLOGY TREATMENT   Patient Name: Adrian [name] MRN: [id number] DOB:Jun 29, 1956, 68 y.o., male Today's Date: 12/03/2023  PCP: NA REFERRING PROVIDER: Genetta Kenning, MD  END OF SESSION:  End of Session - 12/03/23 0933     Visit Number 8    Number of Visits 17    Date for SLP Re-Evaluation 01/07/24    SLP Start Time 0930    SLP Stop Time  1010    SLP Time Calculation (min) 40 min    Activity Tolerance Patient tolerated treatment well             Past Medical History:  Diagnosis Date   AF (paroxysmal atrial fibrillation) (HCC) 11/29/2018   Atrial flutter (HCC) 10/12/2015   a. TEE 3/17 with ? LAA clot-->s/p TEE/DCCV 11/24/2015; s/p DCCV 12/2022;s/p ablation 06/2023   CAD (coronary artery disease), native coronary artery 12/06/2015   a. 10/2015 MV: EF  37%, reversible defect inferior apex, intermediate risk findings; b. 10/2015 Cath: 20% mid RCA.   Carotid artery stenosis    1-39% right ICA stenosis and occluded left ICA   Colonic diverticular abscess    Diverticulitis    History of chemotherapy 2005   Cisplatin   Hypothyroidism    NICM (nonischemic cardiomyopathy) (HCC) 10/14/2015   a. Tachy mediated?;  b. Echo 3/17 - Mild concentric LVH, EF 30-35%, anteroseptal, anterior, anterolateral, apical anterior, lateral hypokinesis, trivial MR, mild to moderately reduced RVSF; c. LHC 3/17 - mRCA 20%   Radiation NOv.3,2005-Dec. 15, 2005   6810 cGy in 30 fractions   Tonsil cancer [facility]) 2005   Dr Ava Lei.  XRT   Past Surgical History:  Procedure Laterality Date   A-FLUTTER ABLATION N/A 06/19/2023   Procedure: A-FLUTTER ABLATION;  Surgeon: Boyce Byes, MD;  Location: [facility] INVASIVE CV LAB;  Service: Cardiovascular;  Laterality: N/A;   APPENDECTOMY N/A 03/07/2019   Procedure: ROBOTIC ASSISTED APPENDECTOMY;  Surgeon: Candyce Champagne, MD;  Location: [facility] ORS;  Service: General;  Laterality: N/A;   CARDIAC CATHETERIZATION N/A 10/18/2015   Procedure:  Left Heart Cath and Coronary Angiography;  Surgeon: Odie Benne, MD;  Location: [facility] INVASIVE CV LAB;  Service: Cardiovascular;  Laterality: N/A;   CARDIOVERSION N/A 11/24/2015   Procedure: CARDIOVERSION;  Surgeon: Loyde Rule, MD;  Location: [facility] ENDOSCOPY;  Service: Cardiovascular;  Laterality: N/A;   CARDIOVERSION N/A 12/20/2022   Procedure: CARDIOVERSION;  Surgeon: Bridgette Campus, MD;  Location: [facility] INVASIVE CV LAB;  Service: Cardiovascular;  Laterality: N/A;   CYSTOSCOPY WITH STENT PLACEMENT Bilateral 03/07/2019   Procedure: CYSTOSCOPY WITH BILATERAL FIREFLY INJECTION;  Surgeon: Andrez Banker, MD;  Location: [facility] ORS;  Service: Urology;  Laterality: Bilateral;   GASTROSTOMY TUBE PLACEMENT  07/04/2004   IR - G tube for tonsilar cancer   IR ANGIO INTRA EXTRACRAN SEL COM CAROTID INNOMINATE UNI R MOD SED  03/09/2019   IR ANGIO VERTEBRAL SEL VERTEBRAL UNI R MOD SED  03/09/2019   IR CT HEAD LTD  03/09/2019   IR PERCUTANEOUS ART THROMBECTOMY/INFUSION INTRACRANIAL INC DIAG ANGIO  03/09/2019   IR RADIOLOGIST EVAL & MGMT  12/18/2018   LAMINECTOMY     C5/placement of steel plate   LOOP RECORDER INSERTION N/A 06/19/2023   Procedure: LOOP RECORDER INSERTION;  Surgeon: Boyce Byes, MD;  Location: [facility] INVASIVE CV LAB;  Service: Cardiovascular;  Laterality: N/A;   NECK SURGERY  2003   replaced disk   RADIOLOGY WITH ANESTHESIA N/A 03/08/2019   Procedure: IR  WITH ANESTHESIA;  Surgeon: Luellen Sages, MD;  Location: [facility] OR;  Service: Radiology;  Laterality: N/A;   TEE WITHOUT CARDIOVERSION N/A 10/15/2015   Procedure: TRANSESOPHAGEAL ECHOCARDIOGRAM (TEE);  Surgeon: Darlis Eisenmenger, MD;  Location: [facility] ENDOSCOPY;  Service: Cardiovascular;  Laterality: N/A;   TEE WITHOUT CARDIOVERSION N/A 11/24/2015   Procedure: TRANSESOPHAGEAL ECHOCARDIOGRAM (TEE);  Surgeon: Loyde Rule, MD;  Location: [facility] ENDOSCOPY;  Service: Cardiovascular;  Laterality: N/A;   TEE WITHOUT CARDIOVERSION N/A 12/20/2022   Procedure:  TRANSESOPHAGEAL ECHOCARDIOGRAM;  Surgeon: Bridgette Campus, MD;  Location: [facility] INVASIVE CV LAB;  Service: Cardiovascular;  Laterality: N/A;   XI ROBOTIC ASSISTED COLOSTOMY TAKEDOWN N/A 03/07/2019   Procedure: XI ROBOTIC ASSISTED LOW ANTERIOR RESECTION, RIGID PROCTOSCOPY;  Surgeon: Candyce Champagne, MD;  Location: [facility] ORS;  Service: General;  Laterality: N/A;   Patient Active Problem List   Diagnosis Date Noted   Carotid occlusion, left 10/23/2023   ICH (intracerebral hemorrhage) (HCC) 08/11/2023   Carotid artery stenosis    Atrial flutter (HCC) 12/19/2022   Essential hypertension 04/22/2021   Hypokalemia 03/12/2019   Expressive aphasia 03/10/2019   Dysphagia 03/10/2019   Middle cerebral artery embolism, left 03/09/2019   Diverticular stricture (HCC) 03/07/2019   Bacteria in urine    Chronic atrial fibrillation (HCC) 12/02/2018   Current use of long term anticoagulation 12/02/2018   UTI (urinary tract infection) 11/29/2018   Hyponatremia 11/29/2018   PAF (paroxysmal atrial fibrillation) (HCC) 11/29/2018   Diverticulitis of large intestine with abscess 09/23/2018   Dyslipidemia 12/05/2017   CAD (coronary artery disease), native coronary artery 12/06/2015   SOB (shortness of breath)    NICM (nonischemic cardiomyopathy) (HCC)    Thyroid  activity decreased    Hypothyroidism 08/12/2013   History of radiation therapy 05/30/2012   Smokes tobacco daily 05/30/2012   Cancer of tonsillar fossa (HCC) 11/27/2011   Tonsil cancer (HCC) 2005   History of chemotherapy 2005    ONSET DATE: Referred on 10/26/23 (CVA in Jan)   REFERRING DIAG: I61.0 (ICD-10-CM) - Nontraumatic subcortical hemorrhage of left cerebral hemisphere (HCC)   THERAPY DIAG:  Cognitive communication deficit  Aphasia  Rationale for Evaluation and Treatment: Rehabilitation  SUBJECTIVE:   SUBJECTIVE STATEMENT: Pt reports things have been going "okay"  Pt accompanied by: self  PERTINENT HISTORY: Per chart review: 68 y.o. male  with history of atrial fibrillation, on Eliquis , carotid artery stenosis, hypothyroidism , CVA 1/25  PAIN:  Are you having pain? No  FALLS: Has patient fallen in last 6 months?  No  LIVING ENVIRONMENT: Lives with: lives alone Lives in: House/apartment Ex Wife will be driving to appointments (or friends) Sheryle Donning  PLOF:  Level of assistance: Independent with ADLs, Independent with IADLs Employment: Retired; [employer]   PATIENT GOALS: memory, word finding, speech   OBJECTIVE:  Note: Objective measures were completed at Evaluation unless otherwise noted.  DIAGNOSTIC FINDINGS: Per Chart Review  CT HEAD CODE STROKE WO CONTRAST 08/11/23  IMPRESSION: 1.8 x 2.0 x 1.1 cm parenchymal hemorrhage centered in the left basal ganglia with intraventricular extension. No evidence of hydrocephalus at the time of exam. No significant midline shift.   Findings were paged to Dr. Murvin Arthurs on 08/11/23 at 2:29 PM     Electronically Signed   By: Clora Dane M.D.   On: 08/11/2023 14:31  COGNITION: Overall cognitive status: Impaired Areas of impairment:  Attention: Impaired: Selective, Alternating, Divided Memory: Impaired: Short term Prospective Executive function: Impaired: Problem solving, Organization, Planning, and Slow processing Functional deficits:  Reports difficulty with recall of important information  AUDITORY COMPREHENSION: Overall auditory comprehension: Appears intact YES/NO questions: Appears intact Following directions: Appears intact Conversation: Complex Interfering components: attention and processing speed Effective technique: extra processing time, pausing, and repetition/stressing words  READING COMPREHENSION: Intact  EXPRESSION: verbal  VERBAL EXPRESSION: Level of generative/spontaneous verbalization: conversation  Comments: Anomia  Non-verbal means of communication: N/A  WRITTEN EXPRESSION: Dominant hand: right Written expression: Not tested  MOTOR  SPEECH: Overall motor speech: impaired Level of impairment: Conversation Respiration:  WFL Phonation: normal Resonance: WFL Articulation: Appears intact; pt reports slurred speech when feeling tired Intelligibility: Intelligible; 100% intelligible at eval; reports worsening speech when fatigued Motor planning: Appears intact Motor speech errors:  NA Interfering components:  NA Effective technique: slow rate and over articulate  ORAL MOTOR EXAMINATION: Overall status: Impaired:   Labial: Right (Symmetry) Facial: Right (Symmetry) *ROM is good. At rest, facial droop on R side. Comments: NA   STANDARDIZED ASSESSMENTS: Initiated CLQT - to complete next session    Cognitive Linguistic Quick Test: AGE - 18 - 69   The Cognitive Linguistic Quick Test (CLQT) was administered to assess the relative status of five cognitive domains: attention, memory, language, executive functioning, and visuospatial skills. Scores from 10 tasks were used to estimate severity ratings (standardized for age groups 18-69 years and 70-89 years) for each domain, a clock drawing task, as well as an overall composite severity rating of cognition.       Task Score Criterion Cut Scores  Personal Facts 7/8 8  Symbol Cancellation 11/12 11  Confrontation Naming 10/10 10  Clock Drawing  11/13 12  Story Retelling 6/10 6  Symbol Trails 10/10 9  Generative Naming 3/9 5  Design Memory 6/6 5  Mazes  8/8 7  Design Generation 5/13 6     Cognitive Domain Composite Score Severity Rating  Attention 190/215 WNL  Memory 148/185 Mild  Executive Function 26/40 WNL  Language 26/37 Mild  Visuospatial Skills 95/105 WNL  Clock Drawing  11/13 Mild  Composite Severity Rating  WNL           PATIENT REPORTED OUTCOME MEASURES (PROM): Neuro QOL - Cognitive Short Form: 29                                                                                                                             TREATMENT DATE:    12/03/23: Pt was seen for skilled ST services. Pt reports speech intelligibility has been good, but still struggling when feeling more tired. He reports he has been mindful of dysarthria strategies during these times re: pausing and increasing loudness. Pt feels word finding is the most difficult for him at this time and would like to focus on this. He reports he also has trouble understanding the TV. SLP suggested putting subtitles on the TV to see if this is helpful. Pt reported he had an instance where he could not think of the name "iris".  Pt did not use a strategy in this instance. SLP suggested trying "look it up" and demonstrated how to complete this on google. SLP facilitated use of "look it up" strategy with patient using iphone. Pt was able to generate adequate descriptions to find missing words for [employer], his favorite pizza place. Pt participated in informal conversation with no instances of anomia.   11/28/23: Pt was seen for skilled ST services targeting cognitive-communication. SLP edu on using microphone speech-to-text to make notes to support memory. Pt reports he did not do any writing since he was last here - but he has been thinking about it. SLP included writing in today's therapeutic task re: creation of dysarthria BE CLEAR sentences. Pt was able to generate and produce sentences with intelligible speech independently. Pt's speech continues to be 100% intelligible; however, pt reports this is not the case when he is fatigued. He understands this is when he should be most cognizant of using dysarthria strategies. Pt was tasked with writing 5, SLP-dictated, functional sentences. Pt required modA to spell words correctly. Pt was demonstrate use of speech to text to assist in error correction of sentences.   11/26/23: Pt was seen for skilled ST services targeting cognitive-communication. Pt reports he used "synonym" word finding strategy in an instance of anomia over the week. Pt reports  he hasn't had any other significant difficulty with word finding. We reviewed "Be Clear" strategies for dysarthria re: speaking louder, over-articulating, slow down. SLP checked-in on pt's feelings regarding cognitive difficulties. He reports he hasn't noticed any significant problems; however, he realizes he has forgotten to bring his folder in x2. SLP suggested writing down tasks. Pt reported difficulty with writing from his previous strokes. SLP suggested using speech to text. Pt reported he has been leaving his cell phone at home. SLP encouraged him to always keep his cell nearby in case of emergencies, as well as, a memory tool. Pt attempted to write list of tasks needed to complete. Spelling errors noted. SLP encouraged pt to spell the word in his head prior to writing it down vs dividing attention between trying to figure out how to spell the word while attempting to write. Pt required max cue reminders to first spell the word in his head before attempting to write. Pt was noted to reduce errors by ~50% using therapist strategy.   11/20/23: Pt was seen for skilled ST services targeting cognitive-communication and word finding strategies. Pt completed Cognitive PROMs measure - see above in blue. Pt reports he hasn't had any significant instances of anomia - given a 2-3 second delay, word typically pops out. Pt reports he is to go golfing (on the putting green) with a friend for his birthday. We discussed word finding strategies he could use in conversation. SLP facilitated word finding practice using multiple meaning words. Pt was tasked with finding the synonym (or a phrase close) for the given word. SLP challenged the patient to find synonyms to different golf terms in preparation for his outing. SLP briefly educated on "Be Clear" for dysarthria. To continue next session.   11/15/23: Pt was seen for skilled ST services targeting cognitive-communication and word finding strategies. SLP initiated education  on word finding. SLP provided examples of each strategies and how to use in conversation. Pt reported he feels he would like to the following word finding strategies re: delay, describe, association, look it up. Pt was noted to have errors when reading aloud which was impacting his reading comprehension. Pt was  noted to delete sounds from words  and add functor words while reading. SLP trialed re: pointing to each word with pen, re-reading aloud, re-reading silently to remove expressive component. To demonstrate "describe" strategy through Semantic Feature Analysis (SFA) next session.   11/09/23: Pt was seen for skilled ST services targeting completed cognitive-communication testing and education. SLP reviewed CLQT results with patient. He had no further questions. SLP provided pt with cognitive-communication handout. Pt reported difficulty with re: recalling details in conversations, understanding TV shows, difficulty with expressing thoughts, and reading/writing.  Pt has residual aphasia from previous stroke. To provide strategies in this POC.   11/07/23: Provided brief education on cognitive-communication disorder and speech intelligibility.   PATIENT EDUCATION: Education details: SLP role in cognitive-communication impairments Person educated: Patient Education method: Explanation Education comprehension: needs further education   GOALS: Goals reviewed with patient? No; to update after further evaluation  SHORT TERM GOALS: Target date: 12/08/23  Continue Cognitive testing to determine needs Baseline: Goal status: MET  2.  Complete PROMs measure Baseline:  Goal status: INITIAL  3.  Pt will recall 3 word finding strategies to use in instances of anomia with independently. Baseline:  Goal status: INITIAL  4.  Pt will verbalize 2 attention strategies to improve concentration during functional tasks.  Baseline:  Goal status: INITIAL  5. Pt will verbalize 2 memory strategies for recall of  important information.  Baseline:  Goal status: INITIAL    LONG TERM GOALS: Target date: 01/08/24  Pt will improve score on PROMs measure Baseline:  Goal status: INITIAL  2.  Pt will report successful use of word finding strategies at home and in the community. Baseline:  Goal status: INITIAL  3.  Pt will report successful use of attention strategies at home and in the community. Baseline:  Goal status: INITIAL  4.  Pt will report successful use of memory strategies at home and in the community.  Baseline:  Goal status: INITIAL   ASSESSMENT:  CLINICAL IMPRESSION: Pt is a 68 yo male who presents to ST OP for evaluation post recent CVA in Jan. Pt lives alone and will be driven to appointments by ex-wife or friends. Pt endorses speech changes, in addition to sx associated with cognitive-communication challenges MV:[id number] of new information, word finding, and slowed processing.  Pt was assessed using CLQT - to complete next session. SLP observed difficulty with short term memory, instances of anomia, and very mild dysarthria. Pt was 100% intelligible in quiet, therapy room to an unfamiliar listener. He reports speech is worse when feeling tired. SLP to provide strategies for pt use in moments where speech may be unclear. Pt reports no difficulty with swallowing at this time and is tolerating a regular/thin diet. SLP rec skilled ST services to address cognitive-communication impairment and dysarthria to maximize functional independence and increase QOL.     OBJECTIVE IMPAIRMENTS: include memory, executive functioning, expressive language, and dysarthria. These impairments are limiting patient from effectively communicating at home and in community. Factors affecting potential to achieve goals and functional outcome are  NA . Patient will benefit from skilled SLP services to address above impairments and improve overall function.  REHAB POTENTIAL: Good  PLAN:  SLP FREQUENCY:  1-2x/week  SLP DURATION: 8 weeks  PLANNED INTERVENTIONS: Environmental controls, Cueing hierachy, Internal/external aids, Functional tasks, SLP instruction and feedback, Compensatory strategies, Patient/family education, and [id number] Treatment of speech (30 or 45 min)     [employer], CCC-SLP 12/03/2023, 9:34 AM

## 2023-12-03 NOTE — Therapy (Signed)
 OUTPATIENT OCCUPATIONAL THERAPY NEURO TREATMENT  Patient Name: Adrian [name] MRN: [id number] DOB:March 31, 1956, 68 y.o., male Today's Date: 12/03/2023  PCP: Dr. Darren Em REFERRING PROVIDER: Dr. Sharl Davies  END OF SESSION:  OT End of Session - 12/03/23 1049     Visit Number 9    Number of Visits 25    Date for OT Re-Evaluation 01/23/24    Authorization Type Medicare    Authorization - Visit Number 9    Progress Note Due on Visit 10    OT Start Time 1016    OT Stop Time 1100    OT Time Calculation (min) 44 min    Activity Tolerance Patient tolerated treatment well    Behavior During Therapy [facility] for tasks assessed/performed                   Past Medical History:  Diagnosis Date   AF (paroxysmal atrial fibrillation) (HCC) 11/29/2018   Atrial flutter (HCC) 10/12/2015   a. TEE 3/17 with ? LAA clot-->s/p TEE/DCCV 11/24/2015; s/p DCCV 12/2022;s/p ablation 06/2023   CAD (coronary artery disease), native coronary artery 12/06/2015   a. 10/2015 MV: EF  37%, reversible defect inferior apex, intermediate risk findings; b. 10/2015 Cath: 20% mid RCA.   Carotid artery stenosis    1-39% right ICA stenosis and occluded left ICA   Colonic diverticular abscess    Diverticulitis    History of chemotherapy 2005   Cisplatin   Hypothyroidism    NICM (nonischemic cardiomyopathy) (HCC) 10/14/2015   a. Tachy mediated?;  b. Echo 3/17 - Mild concentric LVH, EF 30-35%, anteroseptal, anterior, anterolateral, apical anterior, lateral hypokinesis, trivial MR, mild to moderately reduced RVSF; c. LHC 3/17 - mRCA 20%   Radiation NOv.3,2005-Dec. 15, 2005   6810 cGy in 30 fractions   Tonsil cancer [facility]) 2005   Dr Ava Lei.  XRT   Past Surgical History:  Procedure Laterality Date   A-FLUTTER ABLATION N/A 06/19/2023   Procedure: A-FLUTTER ABLATION;  Surgeon: Boyce Byes, MD;  Location: [facility] INVASIVE CV LAB;  Service: Cardiovascular;  Laterality: N/A;   APPENDECTOMY N/A 03/07/2019   Procedure:  ROBOTIC ASSISTED APPENDECTOMY;  Surgeon: Candyce Champagne, MD;  Location: [facility] ORS;  Service: General;  Laterality: N/A;   CARDIAC CATHETERIZATION N/A 10/18/2015   Procedure: Left Heart Cath and Coronary Angiography;  Surgeon: Odie Benne, MD;  Location: [facility] INVASIVE CV LAB;  Service: Cardiovascular;  Laterality: N/A;   CARDIOVERSION N/A 11/24/2015   Procedure: CARDIOVERSION;  Surgeon: Loyde Rule, MD;  Location: [facility] ENDOSCOPY;  Service: Cardiovascular;  Laterality: N/A;   CARDIOVERSION N/A 12/20/2022   Procedure: CARDIOVERSION;  Surgeon: Bridgette Campus, MD;  Location: [facility] INVASIVE CV LAB;  Service: Cardiovascular;  Laterality: N/A;   CYSTOSCOPY WITH STENT PLACEMENT Bilateral 03/07/2019   Procedure: CYSTOSCOPY WITH BILATERAL FIREFLY INJECTION;  Surgeon: Andrez Banker, MD;  Location: [facility] ORS;  Service: Urology;  Laterality: Bilateral;   GASTROSTOMY TUBE PLACEMENT  07/04/2004   IR - G tube for tonsilar cancer   IR ANGIO INTRA EXTRACRAN SEL COM CAROTID INNOMINATE UNI R MOD SED  03/09/2019   IR ANGIO VERTEBRAL SEL VERTEBRAL UNI R MOD SED  03/09/2019   IR CT HEAD LTD  03/09/2019   IR PERCUTANEOUS ART THROMBECTOMY/INFUSION INTRACRANIAL INC DIAG ANGIO  03/09/2019   IR RADIOLOGIST EVAL & MGMT  12/18/2018   LAMINECTOMY     C5/placement of steel plate   LOOP RECORDER INSERTION N/A 06/19/2023   Procedure: LOOP RECORDER INSERTION;  Surgeon:  Boyce Byes, MD;  Location: [facility] INVASIVE CV LAB;  Service: Cardiovascular;  Laterality: N/A;   NECK SURGERY  2003   replaced disk   RADIOLOGY WITH ANESTHESIA N/A 03/08/2019   Procedure: IR WITH ANESTHESIA;  Surgeon: Luellen Sages, MD;  Location: [facility] OR;  Service: Radiology;  Laterality: N/A;   TEE WITHOUT CARDIOVERSION N/A 10/15/2015   Procedure: TRANSESOPHAGEAL ECHOCARDIOGRAM (TEE);  Surgeon: Darlis Eisenmenger, MD;  Location: [facility] ENDOSCOPY;  Service: Cardiovascular;  Laterality: N/A;   TEE WITHOUT CARDIOVERSION N/A 11/24/2015   Procedure: TRANSESOPHAGEAL  ECHOCARDIOGRAM (TEE);  Surgeon: Loyde Rule, MD;  Location: [facility] ENDOSCOPY;  Service: Cardiovascular;  Laterality: N/A;   TEE WITHOUT CARDIOVERSION N/A 12/20/2022   Procedure: TRANSESOPHAGEAL ECHOCARDIOGRAM;  Surgeon: Bridgette Campus, MD;  Location: [facility] INVASIVE CV LAB;  Service: Cardiovascular;  Laterality: N/A;   XI ROBOTIC ASSISTED COLOSTOMY TAKEDOWN N/A 03/07/2019   Procedure: XI ROBOTIC ASSISTED LOW ANTERIOR RESECTION, RIGID PROCTOSCOPY;  Surgeon: Candyce Champagne, MD;  Location: [facility] ORS;  Service: General;  Laterality: N/A;   Patient Active Problem List   Diagnosis Date Noted   Carotid occlusion, left 10/23/2023   ICH (intracerebral hemorrhage) (HCC) 08/11/2023   Carotid artery stenosis    Atrial flutter (HCC) 12/19/2022   Essential hypertension 04/22/2021   Hypokalemia 03/12/2019   Expressive aphasia 03/10/2019   Dysphagia 03/10/2019   Middle cerebral artery embolism, left 03/09/2019   Diverticular stricture (HCC) 03/07/2019   Bacteria in urine    Chronic atrial fibrillation (HCC) 12/02/2018   Current use of long term anticoagulation 12/02/2018   UTI (urinary tract infection) 11/29/2018   Hyponatremia 11/29/2018   PAF (paroxysmal atrial fibrillation) (HCC) 11/29/2018   Diverticulitis of large intestine with abscess 09/23/2018   Dyslipidemia 12/05/2017   CAD (coronary artery disease), native coronary artery 12/06/2015   SOB (shortness of breath)    NICM (nonischemic cardiomyopathy) (HCC)    Thyroid  activity decreased    Hypothyroidism 08/12/2013   History of radiation therapy 05/30/2012   Smokes tobacco daily 05/30/2012   Cancer of tonsillar fossa (HCC) 11/27/2011   Tonsil cancer (HCC) 2005   History of chemotherapy 2005    ONSET DATE: 08/11/23  REFERRING DIAG: I61.0 (ICD-10-CM) - Nontraumatic subcortical hemorrhage of left cerebral hemisphere (HCC)   THERAPY DIAG:  No diagnosis found.  Rationale for Evaluation and Treatment: Rehabilitation  SUBJECTIVE:   SUBJECTIVE  STATEMENT: Pt reports he walked alot in [employer]  Pt accompanied by: self  PERTINENT HISTORY:   68 yo male hospitalized 08/11/23 with R sided weakness. CT showed a large basal ganglia ICH with IVH extension. PMH includes:  A-fib, carotid artery stenosis, CAD, tonsil cancer, hypothyroidism, L MCA CVA.    PRECAUTIONS: Fall  WEIGHT BEARING RESTRICTIONS: No  PAIN:  Are you having pain? No  FALLS: Has patient fallen in last 6 months? No  LIVING ENVIRONMENT: Lives with: lives alone has friends and ex wife checks in on him Lives in: House/apartment Stairs: no Has following equipment at home: Single point cane and Tour manager  PLOF: Independent  PATIENT GOALS: improve use of RUE  OBJECTIVE:  Note: Objective measures were completed at Evaluation unless otherwise noted.  HAND DOMINANCE: Right  ADLs: Overall ADLs: mod I with all basic ADLS Transfers/ambulation related to ADLs: Eating: uses RUE 20%, currently using non dominant LUE,  Grooming: using non dominant LUE UB Dressing: mod I difficulty with buttons LB Dressing: mod I Toileting: mod I with cane Bathing: has tub bench Tub Shower transfers: mod I  Equipment: Transfer tub bench  IADLs: Shopping: needs assist for transportation Light housekeeping: Pt is performing light cleaning, he has occasional assist Meal Prep: microwave meals primarily, does a little stove top Medication management: pt handles  Financial management: pt handles Handwriting: 90% legibility, handwriting is small   MOBILITY STATUS:  mod I with cane  ACTIVITY TOLERANCE: Activity tolerance: standing  UPPER EXTREMITY ROM:  grossly WFLS  UPPER EXTREMITY MMT:     MMT Right eval Left eval  Shoulder flexion 3+/5 4+/5  Shoulder abduction    Shoulder adduction    Shoulder extension    Shoulder internal rotation    Shoulder external rotation    Middle trapezius    Lower trapezius    Elbow flexion 4-/5 4-/5  Elbow extension 4-/5 4-/5  Wrist  flexion    Wrist extension    Wrist ulnar deviation    Wrist radial deviation    Wrist pronation    Wrist supination    (Blank rows = not tested)  HAND FUNCTION: Grip strength: Right: 50 lbs; Left: 63 lbs  COORDINATION: 9 Hole Peg test: Right: 59.69 sec; Left: 29.35 sec  SENSATION:diminished in RUE  COGNITION: Overall cognitive status:unable to spell WORLD backwards correctly, recalls 2/3 words following a delay.  VISION ASSESSMENT: Not tested- denies visual changes   OBSERVATIONS: Pleasant gentleman motivated to improve                                                                                                                              TREATMENT DATE: 12/03/23 Standing to copy small peg design on vertical surface, mod difficulty/ drops and min v.c, increased time required, min errors for design, min v.c for in hand manipulation  Ambulating 125 ft while carrying plate in right hand while using cane in left min v.c no drops. Closed chain shoulder flexion, then digaonals each direction with left and right UE's, min v.c for positioning  11/28/23-UBE x 6 mins level 2 for conditioning, min v.c for speed and balanced use of UE's Mosaic picture design to pick up various shaped pieces and manipulate to place in frame while completing a design for increased fine motor coordination with a cognitve component, min difficulty, min v.c for in hand manipulation and to avoid compensatory patterns. Increased time required for this task. Education provided regarding memory compensations.  11/26/23- UBE x 6 mins level 3 for conditioning Copying small peg design with RUE for increased fine motor coordination with a cognitive component, min difficulty, increased time required, v.c  Reveiwed previously issued yellow theraband exercises, 10- 20 reps each, min v.c shoulder flexion with foam roll 15 reps to eye level with improved control Followed by shoulder flexion low to mid range with 1 lbs  weight in each hand focus on control  11/21/23- Pt practiced placing screwing bolts into pegboard with RUE then using tools such as screw driver, Customer service manager and wrench to tighten items with RUE min-mod v.c, pt  also practiced locking and unlocking a pad lock. Pt with mod difficulty using wrench, min difficulty for using screw driver. UBE x5 mins level 1 for conditioning  11/19/23 Closed chain shoulder flexion with foam roll 2 sets of 10 reps min v.c and facilitation for shoulder positioning and focus on control. Reviewed yellow theraband exercises 15 reps each bilateral UE's. Pt performed biceps  curls with bilateral UE's simultaneously with cues to have RUE mirror movements of LUE, min v.c Placing grooved pegs into pegboard with RUE for increased fine motor coordination, min difficulty/ v.c  Removing pegs with tweezers, for sustained pinch for 1/2 the items,  mod difficulty/ drops   11/15/23 UBE x 6 mins level 1 for conditioning closed chain shoulder flexion with medium ball x 15 reps then with foam roll for send set min v.c for control. Yellow theraband for biceps curls and triceps extension, 10-15 reps each UE, min v.c copying small peg design with RUE min-mod difficulty/ drops, min v.c to avoid compensation.  11/12/23- Reviewed cane HEP 10-15 reps min v.c Functional reaching to place and retrieve clothespins(1-8#) from overhead target(vertical yardstick) with RUE for functional reach and sustained pinch, min difficulty, v.c Placing metal pegs( 3 sizes) into pegboard with RUE then removing with in hand manipulation, min difficulty/ v.c  Flipping and dealing cards with RUE for increased fine motor coordination, min v.c   11/09/23: Pt instructed in cane HEP.  Pt fatigued quickly and with difficulty with control, but responded well to cueing.  Also reviewed and added to coordination HEP.  Pt able to copy shopping list with good legibility, but several spelling errors--pt reports difficulty with  spelling if generating a list at home.   10/31/23- eval     PATIENT EDUCATION: Education details: see above  Person educated: Patient Education method: Explanation, Demonstration, Tactile cues, Verbal cues, and Handouts Education comprehension: verbalized understanding, returned demonstration, and verbal cues required  HOME EXERCISE PROGRAM: beginning coordination 3/26 11/09/23  Cane HEP 4/10- biceps triceps with yellow band, shoulder flexion with paper towel roll  11/27/23- memory compensations GOALS: Goals reviewed with patient? Yes  SHORT TERM GOALS: Target date: 12/01/23  I with inital HEP Goal status: met for cane  2.  Pt will report feeding himself at least 50% of the time with RUE. Baseline: uses 20% x Goal status:  met, per pt report 78% 11/28/23  3.  Pt will demonstrate improved RUE fine motor coordination for ADLs as evidenced by decreasing 9 hole peg test score to 52 secs or less Goal status: ongoing 11/28/23  4.  Pt will report consistently brushing his teeth with RUE Goal status: ongoing, brushes teeth with RUE 50% of the time 11/28/23  5.  I with memory compensation strategies Goal status: met, issued 11/28/23  6.  Pt will report that he has resumed moderate level home management mod I Goal status: ongoing, 11/28/22   LONG TERM GOALS: Target date: 01/23/24  I with updated HEP Goal status: INITIAL  2.  Pt will demonstrate improved RUE fine motor coordination for ADLs as evidenced by decreasing 9 hole peg test score to 48 secs or less Goal status: ongoing  3.  Pt will demonstrate ability to carry a plate in RUE without spills. Goal status: INITIAL  4.  Pt will write a short paragraph with 100% legibility and minimal decrease in letter size. Goal status: INITIAL  5.  Pt will resume use of RUE as dominant hand at least 75% of the time for ADLs/  IADLs. Goal status: INITIAL  6. Pt will retrieve a 3 lbs object from eye level shelf using RUE with good  control  Goal status: inital  ASSESSMENT:  CLINICAL IMPRESSION: Pt is progressing towards goals. Pt demonstrates improving RUE contol, whith pt demonstrating ability to carry a plastic plate in RUE without drops while ambulatiing.Aaron AasPERFORMANCE DEFICITS: in functional skills including ADLs, IADLs, coordination, dexterity, sensation, strength, flexibility, Fine motor control, Gross motor control, mobility, balance, endurance, decreased knowledge of precautions, decreased knowledge of use of DME, and UE functional use, cognitive skills including attention, memory, problem solving, safety awareness, and thought, and psychosocial skills including coping strategies, environmental adaptation, habits, interpersonal interactions, and routines and behaviors.   IMPAIRMENTS: are limiting patient from ADLs, IADLs, play, leisure, and social participation.   CO-MORBIDITIES: may have co-morbidities  that affects occupational performance. Patient will benefit from skilled OT to address above impairments and improve overall function.  MODIFICATION OR ASSISTANCE TO COMPLETE EVALUATION: No modification of tasks or assist necessary to complete an evaluation.  OT OCCUPATIONAL PROFILE AND HISTORY: Detailed assessment: Review of records and additional review of physical, cognitive, psychosocial history related to current functional performance.  CLINICAL DECISION MAKING: LOW - limited treatment options, no task modification necessary  REHAB POTENTIAL: Good  EVALUATION COMPLEXITY: Low    PLAN:  OT FREQUENCY: 2x/week  OT DURATION: 12 weeks  PLANNED INTERVENTIONS: 97168 OT Re-evaluation, 97535 self care/ADL training, 40981 therapeutic exercise, 97530 therapeutic activity, 97112 neuromuscular re-education, 97140 manual therapy, 97113 aquatic therapy, 97035 ultrasound, 97018 paraffin, 19147 moist heat, 97010 cryotherapy, 97034 contrast bath, 97014 electrical stimulation unattended, 97129 Cognitive training (first 15  min), 82956 Cognitive training(each additional 15 min), 21308 Orthotics management and training, 65784 Splinting (initial encounter), passive range of motion, balance training, functional mobility training, energy conservation, coping strategies training, patient/family education, and DME and/or AE instructions  RECOMMENDED OTHER SERVICES: n/a  CONSULTED AND AGREED WITH PLAN OF CARE: Patient  PLAN FOR NEXT SESSION:   add more visiits, check on HEP   Franciso Dierks, OTR/L 12/03/2023, 12:09 PM

## 2023-12-03 NOTE — Therapy (Signed)
 OUTPATIENT PHYSICAL THERAPY PROGRESS NOTE    Patient Name: Adrian [name] MRN: [id number] DOB:Jul 21, 1956, 68 y.o., male Today's Date: 12/03/2023  Progress Note Reporting Period 10/31/23 to 12/03/23  See note below for Objective Data and Assessment of Progress/Goals.      END OF SESSION:  PT End of Session - 12/03/23 1131     Visit Number 10    Number of Visits 17    Date for PT Re-Evaluation 12/26/23    Authorization Type MCR    Authorization Time Period 10/31/23 to 12/26/23    Progress Note Due on Visit 20    PT Start Time 1103    PT Stop Time 1142    PT Time Calculation (min) 39 min    Activity Tolerance Patient tolerated treatment well    Behavior During Therapy [facility] for tasks assessed/performed                      Past Medical History:  Diagnosis Date   AF (paroxysmal atrial fibrillation) (HCC) 11/29/2018   Atrial flutter (HCC) 10/12/2015   a. TEE 3/17 with ? LAA clot-->s/p TEE/DCCV 11/24/2015; s/p DCCV 12/2022;s/p ablation 06/2023   CAD (coronary artery disease), native coronary artery 12/06/2015   a. 10/2015 MV: EF  37%, reversible defect inferior apex, intermediate risk findings; b. 10/2015 Cath: 20% mid RCA.   Carotid artery stenosis    1-39% right ICA stenosis and occluded left ICA   Colonic diverticular abscess    Diverticulitis    History of chemotherapy 2005   Cisplatin   Hypothyroidism    NICM (nonischemic cardiomyopathy) (HCC) 10/14/2015   a. Tachy mediated?;  b. Echo 3/17 - Mild concentric LVH, EF 30-35%, anteroseptal, anterior, anterolateral, apical anterior, lateral hypokinesis, trivial MR, mild to moderately reduced RVSF; c. LHC 3/17 - mRCA 20%   Radiation NOv.3,2005-Dec. 15, 2005   6810 cGy in 30 fractions   Tonsil cancer [facility]) 2005   Dr Ava Lei.  XRT   Past Surgical History:  Procedure Laterality Date   A-FLUTTER ABLATION N/A 06/19/2023   Procedure: A-FLUTTER ABLATION;  Surgeon: Boyce Byes, MD;  Location: [facility] INVASIVE CV  LAB;  Service: Cardiovascular;  Laterality: N/A;   APPENDECTOMY N/A 03/07/2019   Procedure: ROBOTIC ASSISTED APPENDECTOMY;  Surgeon: Candyce Champagne, MD;  Location: [facility] ORS;  Service: General;  Laterality: N/A;   CARDIAC CATHETERIZATION N/A 10/18/2015   Procedure: Left Heart Cath and Coronary Angiography;  Surgeon: Odie Benne, MD;  Location: [facility] INVASIVE CV LAB;  Service: Cardiovascular;  Laterality: N/A;   CARDIOVERSION N/A 11/24/2015   Procedure: CARDIOVERSION;  Surgeon: Loyde Rule, MD;  Location: [facility] ENDOSCOPY;  Service: Cardiovascular;  Laterality: N/A;   CARDIOVERSION N/A 12/20/2022   Procedure: CARDIOVERSION;  Surgeon: Bridgette Campus, MD;  Location: [facility] INVASIVE CV LAB;  Service: Cardiovascular;  Laterality: N/A;   CYSTOSCOPY WITH STENT PLACEMENT Bilateral 03/07/2019   Procedure: CYSTOSCOPY WITH BILATERAL FIREFLY INJECTION;  Surgeon: Andrez Banker, MD;  Location: [facility] ORS;  Service: Urology;  Laterality: Bilateral;   GASTROSTOMY TUBE PLACEMENT  07/04/2004   IR - G tube for tonsilar cancer   IR ANGIO INTRA EXTRACRAN SEL COM CAROTID INNOMINATE UNI R MOD SED  03/09/2019   IR ANGIO VERTEBRAL SEL VERTEBRAL UNI R MOD SED  03/09/2019   IR CT HEAD LTD  03/09/2019   IR PERCUTANEOUS ART THROMBECTOMY/INFUSION INTRACRANIAL INC DIAG ANGIO  03/09/2019   IR RADIOLOGIST EVAL & MGMT  12/18/2018   LAMINECTOMY  C5/placement of steel plate   LOOP RECORDER INSERTION N/A 06/19/2023   Procedure: LOOP RECORDER INSERTION;  Surgeon: Boyce Byes, MD;  Location: [facility] INVASIVE CV LAB;  Service: Cardiovascular;  Laterality: N/A;   NECK SURGERY  2003   replaced disk   RADIOLOGY WITH ANESTHESIA N/A 03/08/2019   Procedure: IR WITH ANESTHESIA;  Surgeon: Luellen Sages, MD;  Location: [facility] OR;  Service: Radiology;  Laterality: N/A;   TEE WITHOUT CARDIOVERSION N/A 10/15/2015   Procedure: TRANSESOPHAGEAL ECHOCARDIOGRAM (TEE);  Surgeon: Darlis Eisenmenger, MD;  Location: [facility] ENDOSCOPY;  Service: Cardiovascular;   Laterality: N/A;   TEE WITHOUT CARDIOVERSION N/A 11/24/2015   Procedure: TRANSESOPHAGEAL ECHOCARDIOGRAM (TEE);  Surgeon: Loyde Rule, MD;  Location: [facility] ENDOSCOPY;  Service: Cardiovascular;  Laterality: N/A;   TEE WITHOUT CARDIOVERSION N/A 12/20/2022   Procedure: TRANSESOPHAGEAL ECHOCARDIOGRAM;  Surgeon: Bridgette Campus, MD;  Location: [facility] INVASIVE CV LAB;  Service: Cardiovascular;  Laterality: N/A;   XI ROBOTIC ASSISTED COLOSTOMY TAKEDOWN N/A 03/07/2019   Procedure: XI ROBOTIC ASSISTED LOW ANTERIOR RESECTION, RIGID PROCTOSCOPY;  Surgeon: Candyce Champagne, MD;  Location: [facility] ORS;  Service: General;  Laterality: N/A;   Patient Active Problem List   Diagnosis Date Noted   Carotid occlusion, left 10/23/2023   ICH (intracerebral hemorrhage) (HCC) 08/11/2023   Carotid artery stenosis    Atrial flutter (HCC) 12/19/2022   Essential hypertension 04/22/2021   Hypokalemia 03/12/2019   Expressive aphasia 03/10/2019   Dysphagia 03/10/2019   Middle cerebral artery embolism, left 03/09/2019   Diverticular stricture (HCC) 03/07/2019   Bacteria in urine    Chronic atrial fibrillation (HCC) 12/02/2018   Current use of long term anticoagulation 12/02/2018   UTI (urinary tract infection) 11/29/2018   Hyponatremia 11/29/2018   PAF (paroxysmal atrial fibrillation) (HCC) 11/29/2018   Diverticulitis of large intestine with abscess 09/23/2018   Dyslipidemia 12/05/2017   CAD (coronary artery disease), native coronary artery 12/06/2015   SOB (shortness of breath)    NICM (nonischemic cardiomyopathy) (HCC)    Thyroid  activity decreased    Hypothyroidism 08/12/2013   History of radiation therapy 05/30/2012   Smokes tobacco daily 05/30/2012   Cancer of tonsillar fossa (HCC) 11/27/2011   Tonsil cancer (HCC) 2005   History of chemotherapy 2005    PCP: Glean Lamy MD   REFERRING PROVIDER: Genetta Kenning, MD  REFERRING DIAG: I61.0 (ICD-10-CM) - Nontraumatic subcortical hemorrhage of left cerebral  hemisphere [facility])  THERAPY DIAG:  Unsteadiness on feet  Difficulty in walking, not elsewhere classified  Other lack of coordination  Other abnormalities of gait and mobility  Muscle weakness (generalized)  Rationale for Evaluation and Treatment: Rehabilitation  ONSET DATE: 08/11/23  SUBJECTIVE:   SUBJECTIVE STATEMENT:   Things are going OK, have been working on that heel-toe walking like you were saying   PERTINENT HISTORY: 1.  History of left basal ganglia hemorrhage approximately 2 and half months ago.  He has completed inpatient rehabilitation as well as home health.  I do think he would be a great candidate for outpatient therapy.  He has gotten back to modified independent level but still has some mild weakness on the right side as well as fine motor issues and motor control issues.  His balance is also off.  He continues wear right AFO because of foot and ankle weakness.  Patient has some short-term memory issues as well Will make referral for outpatient PT OT and speech therapy Physical medicine rehab follow-up in 3 months  PAIN:  Are you  having pain? No  0/10  PRECAUTIONS: Fall and Other: R hemi, has loop recorder   RED FLAGS: None   WEIGHT BEARING RESTRICTIONS: No  FALLS:  Has patient fallen in last 6 months? No  LIVING ENVIRONMENT: Lives with: lives alone Lives in: House/apartment Stairs: 2 STE with rail  Has following equipment at home: Single point cane and Environmental consultant - 2 wheeled  OCCUPATION: retired- used to work for [employer]   PLOF: Independent, Independent with basic ADLs, Independent with gait, Independent with transfers, and Requires assistive device for independence  PATIENT GOALS: walk straight without a cane, get back to driving and motorcycling   NEXT MD VISIT: Referring 01/25/24  OBJECTIVE:  Note: Objective measures were completed at Evaluation unless otherwise noted.  DIAGNOSTIC FINDINGS:   CLINICAL DATA:  Stroke, follow up Neuro deficit, acute,  stroke suspected   EXAM: MRI HEAD WITHOUT AND WITH CONTRAST   TECHNIQUE: Multiplanar, multiecho pulse sequences of the brain and surrounding structures were obtained without and with intravenous contrast.   CONTRAST:  6.5mL GADAVIST  GADOBUTROL  1 MMOL/ML IV SOLN   COMPARISON:  CT head from today.   FINDINGS: Brain: When comparing across modalities, no substantial change in intraparenchymal hemorrhage in the left basal ganglia with intraventricular extension. No visible surrounding acute hemorrhage or mass lesion; however, acute blood products limits assessment. Prior left frontal infarct with encephalomalacia. No hydrocephalus. No pathologic enhancement.   Vascular: Major arterial flow voids are maintained at the skull base.   Skull and upper cervical spine: Normal marrow signal.   Sinuses/Orbits: Right frontal sinus opacification. Remaining sinuses are clear. No acute orbital findings.   Other: No mastoid effusions.   IMPRESSION: 1. When comparing across modalities, no substantial change in intraparenchymal hemorrhage in the left basal ganglia with intraventricular extension. No progressive mass effect. 2. No visible surrounding acute hemorrhage or mass lesion; however, acute blood products limits assessment  EXAM: CT HEAD WITHOUT CONTRAST   TECHNIQUE: Contiguous axial images were obtained from the base of the skull through the vertex without intravenous contrast.   RADIATION DOSE REDUCTION: This exam was performed according to the departmental dose-optimization program which includes automated exposure control, adjustment of the mA and/or kV according to patient size and/or use of iterative reconstruction technique.   COMPARISON:  Head CT 08/12/2023   FINDINGS: Brain: Interval decrease in size of the previously seen hemorrhage in the left basal ganglia, now measuring 10 x 7 mm, previously 20 x 12 mm. There is resolution of intraventricular blood products.  No hydrocephalus. No extra-axial fluid collection. No mass effect. No mass lesion. No CT evidence of an acute cortical infarct. There is chronic left MCA territory infarct involving the left opercular region.   Vascular: No hyperdense vessel or unexpected calcification.   Skull: Normal. Negative for fracture or focal lesion.   Sinuses/Orbits: No middle ear or mastoid effusion. Paranasal sinuses are notable for complete opacification of the right frontal sinus. Orbits are unremarkable.   Other: None.   IMPRESSION: No acute intracranial abnormality. Interval decrease in size of left basal ganglia hemorrhage, now measuring 10 x 7 mm, previously 20 x 12 mm. Resolution of intraventricular blood products. No hydrocephalus.    PATIENT SURVEYS:    Patient-Specific Activity Scoring Scheme  "0" represents "unable to perform." "10" represents "able to perform at prior level. 0 1 2 3 4 5 6 7 8 9  10 (Date and Score)   Activity Eval  12/03/23   1. Walking straight without the cane  4  6  2. Strength in RLE   4 6   3. Coordinated movements of R LE  3 6  4.    5.    Score 3.7 6   Total score = sum of the activity scores/number of activities Minimum detectable change (90%CI) for average score = 2 points Minimum detectable change (90%CI) for single activity score = 3 points     COGNITION: Overall cognitive status: Within functional limits for tasks assessed     SENSATION: Not tested      LOWER EXTREMITY MMT:  MMT Right eval Left eval  Hip flexion 4 5  Hip extension    Hip abduction    Hip adduction    Hip internal rotation    Hip external rotation    Knee flexion 4 5  Knee extension 4 5  Ankle dorsiflexion 1 at best (in AFO, reports no movement in ankle)   Ankle plantarflexion    Ankle inversion    Ankle eversion     (Blank rows = not tested)    FUNCTIONAL TESTS:  5 times sit to stand: 12.8 seconds some off shift from R LE  Timed up and go (TUG):  deferred  3 minute walk test: 235ft [facility]     11/28/23 0001  Dynamic Gait Index  Level Surface 2  Change in Gait Speed 2  Gait with Horizontal Head Turns 2  Gait with Vertical Head Turns 2  Gait and Pivot Turn 2  Step Over Obstacle 2  Step Around Obstacles 2  Steps 1  Total Score 15     GAIT: Distance walked: 282ft  Assistive device utilized: Single point cane Level of assistance: Modified independence Comments: wide BOS, increased tone in R LE with gait pattern- limited knee ROM and ankle tends to invert a bit , mild unsteadiness with corners, limited rotation at trunk/hips                                                                                                                                 TREATMENT DATE:   12/03/23  Nustep L6x8 minutes all four extremities for w/u, promotion of reciprocal movement  Forward step ups 6 inch step R LE intermittent UE support for mm endurance and WB tone management   One foot on BOSU/other on solid surface 3x30 seconds B- cues to not lock out R knee Forward lunges onto BOSU x10 B- cues to not lock out R knee in stance, and to avoid R LE circumduction when lifting to top of BOSU Lateral lunges onto BOSU x10 B- cues to not lock out R knee in stance, and to avoid RLE circumduction with movement to top of ball  Gait with 1.5# R LE for improved proprioception x131ft x2- first lap with hurricane, second without. Noted increased LE circumduction without AD    11/28/23   Side step, step over forward and backward on blue foam pad in // bars  DGI re-test  Tandem walk with lateral toe tap each step, alternating blue foam pad // bars     STS with yellow ball in goblet hold x10, cues to avoid offshift from hemi side- placed R foot back/L foot forward for majority of reps  Nustep L6x8 minutes all four extremities for reciprocal movement and functional activity tolerance  Heel toe steps on blue air pad for assist with rocking motion x10 B,  cues to normalize heel-toe and swing through, followed by gait training with focus on heel-toe pattern and keeping hips level            PATIENT EDUCATION:  Education details: exam findings, POC, HEP, otherwise as above  Person educated: Patient Education method: Programmer, multimedia, Demonstration, and Handouts Education comprehension: verbalized understanding, returned demonstration, and needs further education  HOME EXERCISE PROGRAM:  Access Code: [email or web address] URL: [email or web address] Date: 11/20/2023 Prepared by: Terrel Ferries  Exercises - Heel Toe Raises with Counter Support  - 1 x daily - 7 x weekly - 2 sets - 10 reps - Stride Stance Weight Shift  - 1 x daily - 7 x weekly - 2 sets - 10 reps - Single Leg Balance with Clock Reach  - 1 x daily - 7 x weekly - 2 sets - 10 reps - Sit to Stand with Resistance Around Legs  - 1 x daily - 7 x weekly - 2 sets - 10 reps - Forward Step Over with Counter Support  - 1 x daily - 7 x weekly - 2 sets - 10 reps - Backward Step Over with Counter Support  - 1 x daily - 7 x weekly - 2 sets - 10 reps   ASSESSMENT:  CLINICAL IMPRESSION:             Pt arrives today doing well, we updated many objectives last visit for STG check, also updated PSFS today. He is making steady progress and subjective and objective scores thus far, will likely continue to benefit greatly from continuation of skilled PT services moving forward. We did discuss clinician concerns with return to driving, strongly encouraged getting formal MD clearance before returning to this.    From eval: Patient is a 68 y.o. M who was seen today for physical therapy evaluation and treatment for I61.0 (ICD-10-CM) - Nontraumatic subcortical hemorrhage of left cerebral hemisphere [facility]). He is very motivated and moving a very high level given the hemorrhagic CVA. His main concerns are balance/coordination and getting back to walking without assistive device. Anticipate he will do very  well with skilled PT services.   OBJECTIVE IMPAIRMENTS: Abnormal gait, decreased activity tolerance, decreased balance, decreased coordination, decreased knowledge of use of DME, decreased mobility, difficulty walking, decreased strength, and decreased safety awareness.   ACTIVITY LIMITATIONS: standing, squatting, stairs, transfers, and locomotion level  PARTICIPATION LIMITATIONS: driving, shopping, community activity, and yard work  PERSONAL FACTORS: Age, Behavior pattern, Fitness, Past/current experiences, Social background, Time since onset of injury/illness/exacerbation, and Transportation are also affecting patient's functional outcome.   REHAB POTENTIAL: Good  CLINICAL DECISION MAKING: Evolving/moderate complexity  EVALUATION COMPLEXITY: Moderate   GOALS: Goals reviewed with patient? No  SHORT TERM GOALS: Target date: 11/28/2023   Will be compliant with appropriate progressive HEP  Baseline: Goal status: MET 11/28/23  2.  Will score 14/24 on DGI  Baseline: 10 Goal status: MET 11/28/23 15/24  3.  Will be able to perform all functional transfers without compensation strategies or offshift from hemi LE  Baseline:  Goal status: ONGOING  11/28/23- progressing well but still present   4.  Will be able to ambulate household distances without AD, Mod(I) Baseline:  Goal status: ONGOING 11/28/23 still using cane 90% of the time     LONG TERM GOALS: Target date: 12/26/2023    MMT in R LE to be at least 4+/5 in hip and knee musculature  Baseline:  Goal status: INITIAL  2.  Will score 18/24 on DGI to show improved functional balance  Baseline:  Goal status: INITIAL  3.  Will be able to walk at least 260ft over uneven/unsteady surfaces without device and no more than S/ 581ft over even surfaces no device Mod(I) Baseline:  Goal status: INITIAL  4.  Will be able to ascend and descend steps without rail and minimal compensation strategies  Baseline:  Goal status: INITIAL  5.   Will be compliant with advanced HEP vs gym based program at DC to maintain functional gains  Baseline:  Goal status: INITIAL  6.  PSFS to improve by at least 3 points  Baseline: 3.7 Goal status: ONGOING 12/03/23 6   PLAN:  PT FREQUENCY: 2x/week  PT DURATION: 8 weeks  PLANNED INTERVENTIONS: [id number]- PT Re-evaluation, 97110-Therapeutic exercises, 97530- Therapeutic activity, 97112- Neuromuscular re-education, 97535- Self Care, 62952- Manual therapy, [id number]- Gait training, [phone]- Orthotic Fit/training, [phone]- Aquatic Therapy, Stair training, Taping, Dry Needling, Cryotherapy, and Moist heat  PLAN FOR NEXT SESSION: strength, balance, gait training, progressive functional conditioning. Motor control tasks/progressions for R LE. Keep working on foot clearance and normalizing gait pattern.   Terrel Ferries, PT, DPT 12/03/23 11:46 AM

## 2023-12-05 ENCOUNTER — Ambulatory Visit: Admitting: Physical Therapy

## 2023-12-05 ENCOUNTER — Encounter: Payer: Self-pay | Admitting: Physical Therapy

## 2023-12-05 ENCOUNTER — Encounter: Payer: Self-pay | Admitting: Speech Pathology

## 2023-12-05 ENCOUNTER — Ambulatory Visit: Admitting: Speech Pathology

## 2023-12-05 ENCOUNTER — Ambulatory Visit: Admitting: Occupational Therapy

## 2023-12-05 DIAGNOSIS — R41841 Cognitive communication deficit: Secondary | ICD-10-CM

## 2023-12-05 DIAGNOSIS — M6281 Muscle weakness (generalized): Secondary | ICD-10-CM

## 2023-12-05 DIAGNOSIS — R2689 Other abnormalities of gait and mobility: Secondary | ICD-10-CM

## 2023-12-05 DIAGNOSIS — R278 Other lack of coordination: Secondary | ICD-10-CM

## 2023-12-05 DIAGNOSIS — R41840 Attention and concentration deficit: Secondary | ICD-10-CM

## 2023-12-05 DIAGNOSIS — R41844 Frontal lobe and executive function deficit: Secondary | ICD-10-CM

## 2023-12-05 DIAGNOSIS — R4701 Aphasia: Secondary | ICD-10-CM

## 2023-12-05 DIAGNOSIS — R262 Difficulty in walking, not elsewhere classified: Secondary | ICD-10-CM

## 2023-12-05 DIAGNOSIS — R2681 Unsteadiness on feet: Secondary | ICD-10-CM

## 2023-12-05 NOTE — Therapy (Signed)
 OUTPATIENT SPEECH LANGUAGE PATHOLOGY TREATMENT   Patient Name: Adrian [name] MRN: [id number] DOB:03/13/1956, 68 y.o., male Today's Date: 12/05/2023  PCP: NA REFERRING PROVIDER: Genetta Kenning, MD  END OF SESSION:  End of Session - 12/05/23 0935     Visit Number 9    Number of Visits 17    Date for SLP Re-Evaluation 01/07/24    SLP Start Time 0932    SLP Stop Time  1010    SLP Time Calculation (min) 38 min    Activity Tolerance Patient tolerated treatment well             Past Medical History:  Diagnosis Date   AF (paroxysmal atrial fibrillation) (HCC) 11/29/2018   Atrial flutter (HCC) 10/12/2015   a. TEE 3/17 with ? LAA clot-->s/p TEE/DCCV 11/24/2015; s/p DCCV 12/2022;s/p ablation 06/2023   CAD (coronary artery disease), native coronary artery 12/06/2015   a. 10/2015 MV: EF  37%, reversible defect inferior apex, intermediate risk findings; b. 10/2015 Cath: 20% mid RCA.   Carotid artery stenosis    1-39% right ICA stenosis and occluded left ICA   Colonic diverticular abscess    Diverticulitis    History of chemotherapy 2005   Cisplatin   Hypothyroidism    NICM (nonischemic cardiomyopathy) (HCC) 10/14/2015   a. Tachy mediated?;  b. Echo 3/17 - Mild concentric LVH, EF 30-35%, anteroseptal, anterior, anterolateral, apical anterior, lateral hypokinesis, trivial MR, mild to moderately reduced RVSF; c. LHC 3/17 - mRCA 20%   Radiation NOv.3,2005-Dec. 15, 2005   6810 cGy in 30 fractions   Tonsil cancer [facility]) 2005   Dr Ava Lei.  XRT   Past Surgical History:  Procedure Laterality Date   A-FLUTTER ABLATION N/A 06/19/2023   Procedure: A-FLUTTER ABLATION;  Surgeon: Boyce Byes, MD;  Location: [facility] INVASIVE CV LAB;  Service: Cardiovascular;  Laterality: N/A;   APPENDECTOMY N/A 03/07/2019   Procedure: ROBOTIC ASSISTED APPENDECTOMY;  Surgeon: Candyce Champagne, MD;  Location: [facility] ORS;  Service: General;  Laterality: N/A;   CARDIAC CATHETERIZATION N/A 10/18/2015   Procedure:  Left Heart Cath and Coronary Angiography;  Surgeon: Odie Benne, MD;  Location: [facility] INVASIVE CV LAB;  Service: Cardiovascular;  Laterality: N/A;   CARDIOVERSION N/A 11/24/2015   Procedure: CARDIOVERSION;  Surgeon: Loyde Rule, MD;  Location: [facility] ENDOSCOPY;  Service: Cardiovascular;  Laterality: N/A;   CARDIOVERSION N/A 12/20/2022   Procedure: CARDIOVERSION;  Surgeon: Bridgette Campus, MD;  Location: [facility] INVASIVE CV LAB;  Service: Cardiovascular;  Laterality: N/A;   CYSTOSCOPY WITH STENT PLACEMENT Bilateral 03/07/2019   Procedure: CYSTOSCOPY WITH BILATERAL FIREFLY INJECTION;  Surgeon: Andrez Banker, MD;  Location: [facility] ORS;  Service: Urology;  Laterality: Bilateral;   GASTROSTOMY TUBE PLACEMENT  07/04/2004   IR - G tube for tonsilar cancer   IR ANGIO INTRA EXTRACRAN SEL COM CAROTID INNOMINATE UNI R MOD SED  03/09/2019   IR ANGIO VERTEBRAL SEL VERTEBRAL UNI R MOD SED  03/09/2019   IR CT HEAD LTD  03/09/2019   IR PERCUTANEOUS ART THROMBECTOMY/INFUSION INTRACRANIAL INC DIAG ANGIO  03/09/2019   IR RADIOLOGIST EVAL & MGMT  12/18/2018   LAMINECTOMY     C5/placement of steel plate   LOOP RECORDER INSERTION N/A 06/19/2023   Procedure: LOOP RECORDER INSERTION;  Surgeon: Boyce Byes, MD;  Location: [facility] INVASIVE CV LAB;  Service: Cardiovascular;  Laterality: N/A;   NECK SURGERY  2003   replaced disk   RADIOLOGY WITH ANESTHESIA N/A 03/08/2019   Procedure: IR  WITH ANESTHESIA;  Surgeon: Luellen Sages, MD;  Location: [facility] OR;  Service: Radiology;  Laterality: N/A;   TEE WITHOUT CARDIOVERSION N/A 10/15/2015   Procedure: TRANSESOPHAGEAL ECHOCARDIOGRAM (TEE);  Surgeon: Darlis Eisenmenger, MD;  Location: [facility] ENDOSCOPY;  Service: Cardiovascular;  Laterality: N/A;   TEE WITHOUT CARDIOVERSION N/A 11/24/2015   Procedure: TRANSESOPHAGEAL ECHOCARDIOGRAM (TEE);  Surgeon: Loyde Rule, MD;  Location: [facility] ENDOSCOPY;  Service: Cardiovascular;  Laterality: N/A;   TEE WITHOUT CARDIOVERSION N/A 12/20/2022   Procedure:  TRANSESOPHAGEAL ECHOCARDIOGRAM;  Surgeon: Bridgette Campus, MD;  Location: [facility] INVASIVE CV LAB;  Service: Cardiovascular;  Laterality: N/A;   XI ROBOTIC ASSISTED COLOSTOMY TAKEDOWN N/A 03/07/2019   Procedure: XI ROBOTIC ASSISTED LOW ANTERIOR RESECTION, RIGID PROCTOSCOPY;  Surgeon: Candyce Champagne, MD;  Location: [facility] ORS;  Service: General;  Laterality: N/A;   Patient Active Problem List   Diagnosis Date Noted   Carotid occlusion, left 10/23/2023   ICH (intracerebral hemorrhage) (HCC) 08/11/2023   Carotid artery stenosis    Atrial flutter (HCC) 12/19/2022   Essential hypertension 04/22/2021   Hypokalemia 03/12/2019   Expressive aphasia 03/10/2019   Dysphagia 03/10/2019   Middle cerebral artery embolism, left 03/09/2019   Diverticular stricture (HCC) 03/07/2019   Bacteria in urine    Chronic atrial fibrillation (HCC) 12/02/2018   Current use of long term anticoagulation 12/02/2018   UTI (urinary tract infection) 11/29/2018   Hyponatremia 11/29/2018   PAF (paroxysmal atrial fibrillation) (HCC) 11/29/2018   Diverticulitis of large intestine with abscess 09/23/2018   Dyslipidemia 12/05/2017   CAD (coronary artery disease), native coronary artery 12/06/2015   SOB (shortness of breath)    NICM (nonischemic cardiomyopathy) (HCC)    Thyroid  activity decreased    Hypothyroidism 08/12/2013   History of radiation therapy 05/30/2012   Smokes tobacco daily 05/30/2012   Cancer of tonsillar fossa (HCC) 11/27/2011   Tonsil cancer (HCC) 2005   History of chemotherapy 2005    ONSET DATE: Referred on 10/26/23 (CVA in Jan)   REFERRING DIAG: I61.0 (ICD-10-CM) - Nontraumatic subcortical hemorrhage of left cerebral hemisphere [facility])   THERAPY DIAG:  Aphasia  Cognitive communication deficit  Rationale for Evaluation and Treatment: Rehabilitation  SUBJECTIVE:   SUBJECTIVE STATEMENT: Pt reports things have been going "okay"  Pt accompanied by: self  PERTINENT HISTORY: Per chart review: 68 y.o. male  with history of atrial fibrillation, on Eliquis , carotid artery stenosis, hypothyroidism , CVA 1/25  PAIN:  Are you having pain? No  FALLS: Has patient fallen in last 6 months?  No  LIVING ENVIRONMENT: Lives with: lives alone Lives in: House/apartment Ex Wife will be driving to appointments (or friends) Sheryle Donning  PLOF:  Level of assistance: Independent with ADLs, Independent with IADLs Employment: Retired; [employer]   PATIENT GOALS: memory, word finding, speech   OBJECTIVE:  Note: Objective measures were completed at Evaluation unless otherwise noted.  DIAGNOSTIC FINDINGS: Per Chart Review  CT HEAD CODE STROKE WO CONTRAST 08/11/23  IMPRESSION: 1.8 x 2.0 x 1.1 cm parenchymal hemorrhage centered in the left basal ganglia with intraventricular extension. No evidence of hydrocephalus at the time of exam. No significant midline shift.   Findings were paged to Dr. Murvin Arthurs on 08/11/23 at 2:29 PM     Electronically Signed   By: Clora Dane M.D.   On: 08/11/2023 14:31  COGNITION: Overall cognitive status: Impaired Areas of impairment:  Attention: Impaired: Selective, Alternating, Divided Memory: Impaired: Short term Prospective Executive function: Impaired: Problem solving, Organization, Planning, and Slow processing Functional deficits:  Reports difficulty with recall of important information  AUDITORY COMPREHENSION: Overall auditory comprehension: Appears intact YES/NO questions: Appears intact Following directions: Appears intact Conversation: Complex Interfering components: attention and processing speed Effective technique: extra processing time, pausing, and repetition/stressing words  READING COMPREHENSION: Intact  EXPRESSION: verbal  VERBAL EXPRESSION: Level of generative/spontaneous verbalization: conversation  Comments: Anomia  Non-verbal means of communication: N/A  WRITTEN EXPRESSION: Dominant hand: right Written expression: Not tested  MOTOR  SPEECH: Overall motor speech: impaired Level of impairment: Conversation Respiration:  WFL Phonation: normal Resonance: WFL Articulation: Appears intact; pt reports slurred speech when feeling tired Intelligibility: Intelligible; 100% intelligible at eval; reports worsening speech when fatigued Motor planning: Appears intact Motor speech errors:  NA Interfering components:  NA Effective technique: slow rate and over articulate  ORAL MOTOR EXAMINATION: Overall status: Impaired:   Labial: Right (Symmetry) Facial: Right (Symmetry) *ROM is good. At rest, facial droop on R side. Comments: NA   STANDARDIZED ASSESSMENTS: Initiated CLQT - to complete next session    Cognitive Linguistic Quick Test: AGE - 18 - 69   The Cognitive Linguistic Quick Test (CLQT) was administered to assess the relative status of five cognitive domains: attention, memory, language, executive functioning, and visuospatial skills. Scores from 10 tasks were used to estimate severity ratings (standardized for age groups 18-69 years and 70-89 years) for each domain, a clock drawing task, as well as an overall composite severity rating of cognition.       Task Score Criterion Cut Scores  Personal Facts 7/8 8  Symbol Cancellation 11/12 11  Confrontation Naming 10/10 10  Clock Drawing  11/13 12  Story Retelling 6/10 6  Symbol Trails 10/10 9  Generative Naming 3/9 5  Design Memory 6/6 5  Mazes  8/8 7  Design Generation 5/13 6     Cognitive Domain Composite Score Severity Rating  Attention 190/215 WNL  Memory 148/185 Mild  Executive Function 26/40 WNL  Language 26/37 Mild  Visuospatial Skills 95/105 WNL  Clock Drawing  11/13 Mild  Composite Severity Rating  WNL           PATIENT REPORTED OUTCOME MEASURES (PROM): Neuro QOL - Cognitive Short Form: 29                                                                                                                             TREATMENT DATE:    12/05/23: Pt was seen for skilled ST services targeting cognitive-communication. Pt reports he has not been trying to use word finding strategies at home. SLP to continue practice of word finding strategies to encourage transfer of strategies to home/community. SLP initiated education on attention strategies this session re: managing fatigue - spoon theory, managing distractions, brain breaks. To cont with strategy education next session.    12/03/23: Pt was seen for skilled ST services. Pt reports speech intelligibility has been good, but still struggling when feeling more tired. He reports he has been mindful of  dysarthria strategies during these times re: pausing and increasing loudness. Pt feels word finding is the most difficult for him at this time and would like to focus on this. He reports he also has trouble understanding the TV. SLP suggested putting subtitles on the TV to see if this is helpful. Pt reported he had an instance where he could not think of the name "iris". Pt did not use a strategy in this instance. SLP suggested trying "look it up" and demonstrated how to complete this on google. SLP facilitated use of "look it up" strategy with patient using iphone. Pt was able to generate adequate descriptions to find missing words for [employer] Day, his favorite pizza place. Pt participated in informal conversation with no instances of anomia.   11/28/23: Pt was seen for skilled ST services targeting cognitive-communication. SLP edu on using microphone speech-to-text to make notes to support memory. Pt reports he did not do any writing since he was last here - but he has been thinking about it. SLP included writing in today's therapeutic task re: creation of dysarthria BE CLEAR sentences. Pt was able to generate and produce sentences with intelligible speech independently. Pt's speech continues to be 100% intelligible; however, pt reports this is not the case when he is fatigued. He understands this  is when he should be most cognizant of using dysarthria strategies. Pt was tasked with writing 5, SLP-dictated, functional sentences. Pt required modA to spell words correctly. Pt was demonstrate use of speech to text to assist in error correction of sentences.   11/26/23: Pt was seen for skilled ST services targeting cognitive-communication. Pt reports he used "synonym" word finding strategy in an instance of anomia over the week. Pt reports he hasn't had any other significant difficulty with word finding. We reviewed "Be Clear" strategies for dysarthria re: speaking louder, over-articulating, slow down. SLP checked-in on pt's feelings regarding cognitive difficulties. He reports he hasn't noticed any significant problems; however, he realizes he has forgotten to bring his folder in x2. SLP suggested writing down tasks. Pt reported difficulty with writing from his previous strokes. SLP suggested using speech to text. Pt reported he has been leaving his cell phone at home. SLP encouraged him to always keep his cell nearby in case of emergencies, as well as, a memory tool. Pt attempted to write list of tasks needed to complete. Spelling errors noted. SLP encouraged pt to spell the word in his head prior to writing it down vs dividing attention between trying to figure out how to spell the word while attempting to write. Pt required max cue reminders to first spell the word in his head before attempting to write. Pt was noted to reduce errors by ~50% using therapist strategy.   11/20/23: Pt was seen for skilled ST services targeting cognitive-communication and word finding strategies. Pt completed Cognitive PROMs measure - see above in blue. Pt reports he hasn't had any significant instances of anomia - given a 2-3 second delay, word typically pops out. Pt reports he is to go golfing (on the putting green) with a friend for his birthday. We discussed word finding strategies he could use in conversation. SLP  facilitated word finding practice using multiple meaning words. Pt was tasked with finding the synonym (or a phrase close) for the given word. SLP challenged the patient to find synonyms to different golf terms in preparation for his outing. SLP briefly educated on "Be Clear" for dysarthria. To continue next session.   11/15/23: Pt  was seen for skilled ST services targeting cognitive-communication and word finding strategies. SLP initiated education on word finding. SLP provided examples of each strategies and how to use in conversation. Pt reported he feels he would like to the following word finding strategies re: delay, describe, association, look it up. Pt was noted to have errors when reading aloud which was impacting his reading comprehension. Pt was noted to delete sounds from words  and add functor words while reading. SLP trialed re: pointing to each word with pen, re-reading aloud, re-reading silently to remove expressive component. To demonstrate "describe" strategy through Semantic Feature Analysis (SFA) next session.   11/09/23: Pt was seen for skilled ST services targeting completed cognitive-communication testing and education. SLP reviewed CLQT results with patient. He had no further questions. SLP provided pt with cognitive-communication handout. Pt reported difficulty with re: recalling details in conversations, understanding TV shows, difficulty with expressing thoughts, and reading/writing.  Pt has residual aphasia from previous stroke. To provide strategies in this POC.   11/07/23: Provided brief education on cognitive-communication disorder and speech intelligibility.   PATIENT EDUCATION: Education details: SLP role in cognitive-communication impairments Person educated: Patient Education method: Explanation Education comprehension: needs further education   GOALS: Goals reviewed with patient? No; to update after further evaluation  SHORT TERM GOALS: Target date: 12/08/23  Continue  Cognitive testing to determine needs Baseline: Goal status: MET  2.  Complete PROMs measure Baseline:  Goal status: MET  3.  Pt will recall 3 word finding strategies to use in instances of anomia with independently. Baseline:  Goal status: INITIAL  4.  Pt will verbalize 2 attention strategies to improve concentration during functional tasks.  Baseline:  Goal status: INITIAL  5. Pt will verbalize 2 memory strategies for recall of important information.  Baseline:  Goal status: INITIAL    LONG TERM GOALS: Target date: 01/08/24  Pt will improve score on PROMs measure Baseline:  Goal status: INITIAL  2.  Pt will report successful use of word finding strategies at home and in the community. Baseline:  Goal status: INITIAL  3.  Pt will report successful use of attention strategies at home and in the community. Baseline:  Goal status: INITIAL  4.  Pt will report successful use of memory strategies at home and in the community.  Baseline:  Goal status: INITIAL   ASSESSMENT:  CLINICAL IMPRESSION: Pt is a 68 yo male who presents to ST OP for evaluation post recent CVA in Jan. Pt lives alone and will be driven to appointments by ex-wife or friends. Pt endorses speech changes, in addition to sx associated with cognitive-communication challenges [id number] of new information, word finding, and slowed processing.  Pt was assessed using CLQT - to complete next session. SLP observed difficulty with short term memory, instances of anomia, and very mild dysarthria. Pt was 100% intelligible in quiet, therapy room to an unfamiliar listener. He reports speech is worse when feeling tired. SLP to provide strategies for pt use in moments where speech may be unclear. Pt reports no difficulty with swallowing at this time and is tolerating a regular/thin diet. SLP rec skilled ST services to address cognitive-communication impairment and dysarthria to maximize functional independence and increase QOL.      OBJECTIVE IMPAIRMENTS: include memory, executive functioning, expressive language, and dysarthria. These impairments are limiting patient from effectively communicating at home and in community. Factors affecting potential to achieve goals and functional outcome are  NA . Patient will benefit from skilled SLP  services to address above impairments and improve overall function.  REHAB POTENTIAL: Good  PLAN:  SLP FREQUENCY: 1-2x/week  SLP DURATION: 8 weeks  PLANNED INTERVENTIONS: Environmental controls, Cueing hierachy, Internal/external aids, Functional tasks, SLP instruction and feedback, Compensatory strategies, Patient/family education, and [id number] Treatment of speech (30 or 45 min)     [employer], CCC-SLP 12/05/2023, 9:36 AM

## 2023-12-05 NOTE — Therapy (Signed)
 OUTPATIENT PHYSICAL THERAPY TREATMENT    Patient Name: Adrian [name] MRN: [id number] DOB:1956-08-04, 68 y.o., male Today's Date: 12/05/2023      END OF SESSION:  PT End of Session - 12/05/23 1009     Visit Number 11    Number of Visits 17    Date for PT Re-Evaluation 12/26/23    Authorization Type MCR    Authorization Time Period 10/31/23 to 12/26/23    Progress Note Due on Visit 20    PT Start Time 1018    PT Stop Time 1057    PT Time Calculation (min) 39 min    Activity Tolerance Patient tolerated treatment well    Behavior During Therapy [facility] for tasks assessed/performed                       Past Medical History:  Diagnosis Date   AF (paroxysmal atrial fibrillation) (HCC) 11/29/2018   Atrial flutter (HCC) 10/12/2015   a. TEE 3/17 with ? LAA clot-->s/p TEE/DCCV 11/24/2015; s/p DCCV 12/2022;s/p ablation 06/2023   CAD (coronary artery disease), native coronary artery 12/06/2015   a. 10/2015 MV: EF  37%, reversible defect inferior apex, intermediate risk findings; b. 10/2015 Cath: 20% mid RCA.   Carotid artery stenosis    1-39% right ICA stenosis and occluded left ICA   Colonic diverticular abscess    Diverticulitis    History of chemotherapy 2005   Cisplatin   Hypothyroidism    NICM (nonischemic cardiomyopathy) (HCC) 10/14/2015   a. Tachy mediated?;  b. Echo 3/17 - Mild concentric LVH, EF 30-35%, anteroseptal, anterior, anterolateral, apical anterior, lateral hypokinesis, trivial MR, mild to moderately reduced RVSF; c. LHC 3/17 - mRCA 20%   Radiation NOv.3,2005-Dec. 15, 2005   6810 cGy in 30 fractions   Tonsil cancer [facility]) 2005   Dr Ava Lei.  XRT   Past Surgical History:  Procedure Laterality Date   A-FLUTTER ABLATION N/A 06/19/2023   Procedure: A-FLUTTER ABLATION;  Surgeon: Boyce Byes, MD;  Location: [facility] INVASIVE CV LAB;  Service: Cardiovascular;  Laterality: N/A;   APPENDECTOMY N/A 03/07/2019   Procedure: ROBOTIC ASSISTED APPENDECTOMY;   Surgeon: Candyce Champagne, MD;  Location: [facility] ORS;  Service: General;  Laterality: N/A;   CARDIAC CATHETERIZATION N/A 10/18/2015   Procedure: Left Heart Cath and Coronary Angiography;  Surgeon: Odie Benne, MD;  Location: [facility] INVASIVE CV LAB;  Service: Cardiovascular;  Laterality: N/A;   CARDIOVERSION N/A 11/24/2015   Procedure: CARDIOVERSION;  Surgeon: Loyde Rule, MD;  Location: [facility] ENDOSCOPY;  Service: Cardiovascular;  Laterality: N/A;   CARDIOVERSION N/A 12/20/2022   Procedure: CARDIOVERSION;  Surgeon: Bridgette Campus, MD;  Location: [facility] INVASIVE CV LAB;  Service: Cardiovascular;  Laterality: N/A;   CYSTOSCOPY WITH STENT PLACEMENT Bilateral 03/07/2019   Procedure: CYSTOSCOPY WITH BILATERAL FIREFLY INJECTION;  Surgeon: Andrez Banker, MD;  Location: [facility] ORS;  Service: Urology;  Laterality: Bilateral;   GASTROSTOMY TUBE PLACEMENT  07/04/2004   IR - G tube for tonsilar cancer   IR ANGIO INTRA EXTRACRAN SEL COM CAROTID INNOMINATE UNI R MOD SED  03/09/2019   IR ANGIO VERTEBRAL SEL VERTEBRAL UNI R MOD SED  03/09/2019   IR CT HEAD LTD  03/09/2019   IR PERCUTANEOUS ART THROMBECTOMY/INFUSION INTRACRANIAL INC DIAG ANGIO  03/09/2019   IR RADIOLOGIST EVAL & MGMT  12/18/2018   LAMINECTOMY     C5/placement of steel plate   LOOP RECORDER INSERTION N/A 06/19/2023   Procedure: LOOP RECORDER INSERTION;  Surgeon: Boyce Byes, MD;  Location: [facility] INVASIVE CV LAB;  Service: Cardiovascular;  Laterality: N/A;   NECK SURGERY  2003   replaced disk   RADIOLOGY WITH ANESTHESIA N/A 03/08/2019   Procedure: IR WITH ANESTHESIA;  Surgeon: Luellen Sages, MD;  Location: [facility] OR;  Service: Radiology;  Laterality: N/A;   TEE WITHOUT CARDIOVERSION N/A 10/15/2015   Procedure: TRANSESOPHAGEAL ECHOCARDIOGRAM (TEE);  Surgeon: Darlis Eisenmenger, MD;  Location: [facility] ENDOSCOPY;  Service: Cardiovascular;  Laterality: N/A;   TEE WITHOUT CARDIOVERSION N/A 11/24/2015   Procedure: TRANSESOPHAGEAL ECHOCARDIOGRAM (TEE);  Surgeon: Loyde Rule, MD;  Location: [facility] ENDOSCOPY;  Service: Cardiovascular;  Laterality: N/A;   TEE WITHOUT CARDIOVERSION N/A 12/20/2022   Procedure: TRANSESOPHAGEAL ECHOCARDIOGRAM;  Surgeon: Bridgette Campus, MD;  Location: [facility] INVASIVE CV LAB;  Service: Cardiovascular;  Laterality: N/A;   XI ROBOTIC ASSISTED COLOSTOMY TAKEDOWN N/A 03/07/2019   Procedure: XI ROBOTIC ASSISTED LOW ANTERIOR RESECTION, RIGID PROCTOSCOPY;  Surgeon: Candyce Champagne, MD;  Location: [facility] ORS;  Service: General;  Laterality: N/A;   Patient Active Problem List   Diagnosis Date Noted   Carotid occlusion, left 10/23/2023   ICH (intracerebral hemorrhage) (HCC) 08/11/2023   Carotid artery stenosis    Atrial flutter (HCC) 12/19/2022   Essential hypertension 04/22/2021   Hypokalemia 03/12/2019   Expressive aphasia 03/10/2019   Dysphagia 03/10/2019   Middle cerebral artery embolism, left 03/09/2019   Diverticular stricture (HCC) 03/07/2019   Bacteria in urine    Chronic atrial fibrillation (HCC) 12/02/2018   Current use of long term anticoagulation 12/02/2018   UTI (urinary tract infection) 11/29/2018   Hyponatremia 11/29/2018   PAF (paroxysmal atrial fibrillation) (HCC) 11/29/2018   Diverticulitis of large intestine with abscess 09/23/2018   Dyslipidemia 12/05/2017   CAD (coronary artery disease), native coronary artery 12/06/2015   SOB (shortness of breath)    NICM (nonischemic cardiomyopathy) (HCC)    Thyroid  activity decreased    Hypothyroidism 08/12/2013   History of radiation therapy 05/30/2012   Smokes tobacco daily 05/30/2012   Cancer of tonsillar fossa (HCC) 11/27/2011   Tonsil cancer (HCC) 2005   History of chemotherapy 2005    PCP: Glean Lamy MD   REFERRING PROVIDER: Genetta Kenning, MD  REFERRING DIAG: I61.0 (ICD-10-CM) - Nontraumatic subcortical hemorrhage of left cerebral hemisphere [facility])  THERAPY DIAG:  Muscle weakness (generalized)  Difficulty in walking, not elsewhere classified  Other  abnormalities of gait and mobility  Other lack of coordination  Unsteadiness on feet  Rationale for Evaluation and Treatment: Rehabilitation  ONSET DATE: 08/11/23  SUBJECTIVE:   SUBJECTIVE STATEMENT:  Nothing new, no pain  PERTINENT HISTORY: 1.  History of left basal ganglia hemorrhage approximately 2 and half months ago.  He has completed inpatient rehabilitation as well as home health.  I do think he would be a great candidate for outpatient therapy.  He has gotten back to modified independent level but still has some mild weakness on the right side as well as fine motor issues and motor control issues.  His balance is also off.  He continues wear right AFO because of foot and ankle weakness.  Patient has some short-term memory issues as well Will make referral for outpatient PT OT and speech therapy Physical medicine rehab follow-up in 3 months  PAIN:  Are you having pain? No  0/10 now  PRECAUTIONS: Fall and Other: R hemi, has loop recorder   RED FLAGS: None   WEIGHT BEARING RESTRICTIONS: No  FALLS:  Has  patient fallen in last 6 months? No  LIVING ENVIRONMENT: Lives with: lives alone Lives in: House/apartment Stairs: 2 STE with rail  Has following equipment at home: Single point cane and Environmental consultant - 2 wheeled  OCCUPATION: retired- used to work for [employer]   PLOF: Independent, Independent with basic ADLs, Independent with gait, Independent with transfers, and Requires assistive device for independence  PATIENT GOALS: walk straight without a cane, get back to driving and motorcycling   NEXT MD VISIT: Referring 01/25/24  OBJECTIVE:  Note: Objective measures were completed at Evaluation unless otherwise noted.  DIAGNOSTIC FINDINGS:   CLINICAL DATA:  Stroke, follow up Neuro deficit, acute, stroke suspected   EXAM: MRI HEAD WITHOUT AND WITH CONTRAST   TECHNIQUE: Multiplanar, multiecho pulse sequences of the brain and surrounding structures were obtained without and  with intravenous contrast.   CONTRAST:  6.5mL GADAVIST  GADOBUTROL  1 MMOL/ML IV SOLN   COMPARISON:  CT head from today.   FINDINGS: Brain: When comparing across modalities, no substantial change in intraparenchymal hemorrhage in the left basal ganglia with intraventricular extension. No visible surrounding acute hemorrhage or mass lesion; however, acute blood products limits assessment. Prior left frontal infarct with encephalomalacia. No hydrocephalus. No pathologic enhancement.   Vascular: Major arterial flow voids are maintained at the skull base.   Skull and upper cervical spine: Normal marrow signal.   Sinuses/Orbits: Right frontal sinus opacification. Remaining sinuses are clear. No acute orbital findings.   Other: No mastoid effusions.   IMPRESSION: 1. When comparing across modalities, no substantial change in intraparenchymal hemorrhage in the left basal ganglia with intraventricular extension. No progressive mass effect. 2. No visible surrounding acute hemorrhage or mass lesion; however, acute blood products limits assessment  EXAM: CT HEAD WITHOUT CONTRAST   TECHNIQUE: Contiguous axial images were obtained from the base of the skull through the vertex without intravenous contrast.   RADIATION DOSE REDUCTION: This exam was performed according to the departmental dose-optimization program which includes automated exposure control, adjustment of the mA and/or kV according to patient size and/or use of iterative reconstruction technique.   COMPARISON:  Head CT 08/12/2023   FINDINGS: Brain: Interval decrease in size of the previously seen hemorrhage in the left basal ganglia, now measuring 10 x 7 mm, previously 20 x 12 mm. There is resolution of intraventricular blood products. No hydrocephalus. No extra-axial fluid collection. No mass effect. No mass lesion. No CT evidence of an acute cortical infarct. There is chronic left MCA territory infarct involving the  left opercular region.   Vascular: No hyperdense vessel or unexpected calcification.   Skull: Normal. Negative for fracture or focal lesion.   Sinuses/Orbits: No middle ear or mastoid effusion. Paranasal sinuses are notable for complete opacification of the right frontal sinus. Orbits are unremarkable.   Other: None.   IMPRESSION: No acute intracranial abnormality. Interval decrease in size of left basal ganglia hemorrhage, now measuring 10 x 7 mm, previously 20 x 12 mm. Resolution of intraventricular blood products. No hydrocephalus.    PATIENT SURVEYS:    Patient-Specific Activity Scoring Scheme  "0" represents "unable to perform." "10" represents "able to perform at prior level. 0 1 2 3 4 5 6 7 8 9  10 (Date and Score)   Activity Eval  12/03/23   1. Walking straight without the cane  4   6  2. Strength in RLE   4 6   3. Coordinated movements of R LE  3 6  4.    5.  Score 3.7 6   Total score = sum of the activity scores/number of activities Minimum detectable change (90%CI) for average score = 2 points Minimum detectable change (90%CI) for single activity score = 3 points     COGNITION: Overall cognitive status: Within functional limits for tasks assessed     SENSATION: Not tested      LOWER EXTREMITY MMT:  MMT Right eval Left eval  Hip flexion 4 5  Hip extension    Hip abduction    Hip adduction    Hip internal rotation    Hip external rotation    Knee flexion 4 5  Knee extension 4 5  Ankle dorsiflexion 1 at best (in AFO, reports no movement in ankle)   Ankle plantarflexion    Ankle inversion    Ankle eversion     (Blank rows = not tested)    FUNCTIONAL TESTS:  5 times sit to stand: 12.8 seconds some off shift from R LE  Timed up and go (TUG): deferred  3 minute walk test: 214ft [facility]     11/28/23 0001  Dynamic Gait Index  Level Surface 2  Change in Gait Speed 2  Gait with Horizontal Head Turns 2  Gait with Vertical Head Turns 2   Gait and Pivot Turn 2  Step Over Obstacle 2  Step Around Obstacles 2  Steps 1  Total Score 15     GAIT: Distance walked: 258ft  Assistive device utilized: Single point cane Level of assistance: Modified independence Comments: wide BOS, increased tone in R LE with gait pattern- limited knee ROM and ankle tends to invert a bit , mild unsteadiness with corners, limited rotation at trunk/hips                                                                                                                                 TREATMENT DATE:   12/05/23  Nustep L6 x8 minutes all four extremities seat 7  Hip hikes x10 B min-mod cues for form  Forward step ups 6 inch step x12 B intermittent UE support  Gait training 242ft 1.5# R ankle for improved proprioception, cues for heel toe pattern especially push off with toe when about to enter swing phase  Backwards walking with 1.5# cues for improved step length L LE to promote increased stance time hemi LE    3 way taps off blue foam pad x10 B, difficulty due to weak hip ABD groups hemi LE  Side steps + forward/backward steps over blue foam pad x2 laps            12/03/23  Nustep L6x8 minutes all four extremities for w/u, promotion of reciprocal movement  Forward step ups 6 inch step R LE intermittent UE support for mm endurance and WB tone management   One foot on BOSU/other on solid surface 3x30 seconds B- cues to not lock out R knee Forward lunges onto BOSU x10  B- cues to not lock out R knee in stance, and to avoid R LE circumduction when lifting to top of BOSU Lateral lunges onto BOSU x10 B- cues to not lock out R knee in stance, and to avoid RLE circumduction with movement to top of ball  Gait with 1.5# R LE for improved proprioception x140ft x2- first lap with hurricane, second without. Noted increased LE circumduction without AD    11/28/23   Side step, step over forward and backward on blue foam pad in // bars  DGI  re-test  Tandem walk with lateral toe tap each step, alternating blue foam pad // bars     STS with yellow ball in goblet hold x10, cues to avoid offshift from hemi side- placed R foot back/L foot forward for majority of reps  Nustep L6x8 minutes all four extremities for reciprocal movement and functional activity tolerance  Heel toe steps on blue air pad for assist with rocking motion x10 B, cues to normalize heel-toe and swing through, followed by gait training with focus on heel-toe pattern and keeping hips level            PATIENT EDUCATION:  Education details: exam findings, POC, HEP, otherwise as above  Person educated: Patient Education method: Explanation, Demonstration, and Handouts Education comprehension: verbalized understanding, returned demonstration, and needs further education  HOME EXERCISE PROGRAM:  Access Code: [email or web address] URL: [email or web address] Date: 11/20/2023 Prepared by: Terrel Ferries  Exercises - Heel Toe Raises with Counter Support  - 1 x daily - 7 x weekly - 2 sets - 10 reps - Stride Stance Weight Shift  - 1 x daily - 7 x weekly - 2 sets - 10 reps - Single Leg Balance with Clock Reach  - 1 x daily - 7 x weekly - 2 sets - 10 reps - Sit to Stand with Resistance Around Legs  - 1 x daily - 7 x weekly - 2 sets - 10 reps - Forward Step Over with Counter Support  - 1 x daily - 7 x weekly - 2 sets - 10 reps - Backward Step Over with Counter Support  - 1 x daily - 7 x weekly - 2 sets - 10 reps   ASSESSMENT:  CLINICAL IMPRESSION:            Pt arrives today doing OK, nothing really new- we continued progressing all interventions today with good tolerance noted. Still has significant gait impairment following his CVA, will continue to address as able. Does have significantly weak hip abductors especially on hemi side which is impacting gait pattern and contributing to CVA related compensation patterns.    From eval: Patient is a 68 y.o. M  who was seen today for physical therapy evaluation and treatment for I61.0 (ICD-10-CM) - Nontraumatic subcortical hemorrhage of left cerebral hemisphere [facility]). He is very motivated and moving a very high level given the hemorrhagic CVA. His main concerns are balance/coordination and getting back to walking without assistive device. Anticipate he will do very well with skilled PT services.   OBJECTIVE IMPAIRMENTS: Abnormal gait, decreased activity tolerance, decreased balance, decreased coordination, decreased knowledge of use of DME, decreased mobility, difficulty walking, decreased strength, and decreased safety awareness.   ACTIVITY LIMITATIONS: standing, squatting, stairs, transfers, and locomotion level  PARTICIPATION LIMITATIONS: driving, shopping, community activity, and yard work  PERSONAL FACTORS: Age, Behavior pattern, Fitness, Past/current experiences, Social background, Time since onset of injury/illness/exacerbation, and Transportation are also affecting patient's functional outcome.   REHAB POTENTIAL:  Good  CLINICAL DECISION MAKING: Evolving/moderate complexity  EVALUATION COMPLEXITY: Moderate   GOALS: Goals reviewed with patient? No  SHORT TERM GOALS: Target date: 11/28/2023   Will be compliant with appropriate progressive HEP  Baseline: Goal status: MET 11/28/23  2.  Will score 14/24 on DGI  Baseline: 10 Goal status: MET 11/28/23 15/24  3.  Will be able to perform all functional transfers without compensation strategies or offshift from hemi LE  Baseline:  Goal status: ONGOING 11/28/23- progressing well but still present   4.  Will be able to ambulate household distances without AD, Mod(I) Baseline:  Goal status: ONGOING 11/28/23 still using cane 90% of the time     LONG TERM GOALS: Target date: 12/26/2023    MMT in R LE to be at least 4+/5 in hip and knee musculature  Baseline:  Goal status: INITIAL  2.  Will score 18/24 on DGI to show improved functional  balance  Baseline:  Goal status: INITIAL  3.  Will be able to walk at least 238ft over uneven/unsteady surfaces without device and no more than S/ 571ft over even surfaces no device Mod(I) Baseline:  Goal status: INITIAL  4.  Will be able to ascend and descend steps without rail and minimal compensation strategies  Baseline:  Goal status: INITIAL  5.  Will be compliant with advanced HEP vs gym based program at DC to maintain functional gains  Baseline:  Goal status: INITIAL  6.  PSFS to improve by at least 3 points  Baseline: 3.7 Goal status: ONGOING 12/03/23 6   PLAN:  PT FREQUENCY: 2x/week  PT DURATION: 8 weeks  PLANNED INTERVENTIONS: [id number]- PT Re-evaluation, 97110-Therapeutic exercises, 97530- Therapeutic activity, 97112- Neuromuscular re-education, 97535- Self Care, [id number]- Manual therapy, [id number]- Gait training, [phone]- Orthotic Fit/training, [phone]- Aquatic Therapy, Stair training, Taping, Dry Needling, Cryotherapy, and Moist heat  PLAN FOR NEXT SESSION: strength, balance, gait training, progressive functional conditioning. Motor control tasks/progressions for R LE. Keep working on foot clearance and normalizing gait pattern as able   Terrel Ferries, PT, DPT 12/05/23 10:58 AM

## 2023-12-05 NOTE — Therapy (Addendum)
 OUTPATIENT OCCUPATIONAL THERAPY NEURO TREATMENT  Patient Name: Adrian [name] MRN: [id number] DOB:11-09-1955, 68 y.o., male Today's Date: 12/05/2023  PCP: Dr. Darren Em REFERRING PROVIDER: Dr. Sharl Davies  END OF SESSION:  OT End of Session - 12/05/23 1125     Visit Number 10    Number of Visits 25    Date for OT Re-Evaluation 01/23/24    Authorization Type Medicare    Authorization - Visit Number 10    Progress Note Due on Visit 10    OT Start Time 1101    OT Stop Time 1145    OT Time Calculation (min) 44 min    Activity Tolerance Patient tolerated treatment well    Behavior During Therapy [facility] for tasks assessed/performed                    Past Medical History:  Diagnosis Date   AF (paroxysmal atrial fibrillation) (HCC) 11/29/2018   Atrial flutter (HCC) 10/12/2015   a. TEE 3/17 with ? LAA clot-->s/p TEE/DCCV 11/24/2015; s/p DCCV 12/2022;s/p ablation 06/2023   CAD (coronary artery disease), native coronary artery 12/06/2015   a. 10/2015 MV: EF  37%, reversible defect inferior apex, intermediate risk findings; b. 10/2015 Cath: 20% mid RCA.   Carotid artery stenosis    1-39% right ICA stenosis and occluded left ICA   Colonic diverticular abscess    Diverticulitis    History of chemotherapy 2005   Cisplatin   Hypothyroidism    NICM (nonischemic cardiomyopathy) (HCC) 10/14/2015   a. Tachy mediated?;  b. Echo 3/17 - Mild concentric LVH, EF 30-35%, anteroseptal, anterior, anterolateral, apical anterior, lateral hypokinesis, trivial MR, mild to moderately reduced RVSF; c. LHC 3/17 - mRCA 20%   Radiation NOv.3,2005-Dec. 15, 2005   6810 cGy in 30 fractions   Tonsil cancer [facility]) 2005   Dr Ava Lei.  XRT   Past Surgical History:  Procedure Laterality Date   A-FLUTTER ABLATION N/A 06/19/2023   Procedure: A-FLUTTER ABLATION;  Surgeon: Boyce Byes, MD;  Location: [facility] INVASIVE CV LAB;  Service: Cardiovascular;  Laterality: N/A;   APPENDECTOMY N/A 03/07/2019    Procedure: ROBOTIC ASSISTED APPENDECTOMY;  Surgeon: Candyce Champagne, MD;  Location: [facility] ORS;  Service: General;  Laterality: N/A;   CARDIAC CATHETERIZATION N/A 10/18/2015   Procedure: Left Heart Cath and Coronary Angiography;  Surgeon: Odie Benne, MD;  Location: [facility] INVASIVE CV LAB;  Service: Cardiovascular;  Laterality: N/A;   CARDIOVERSION N/A 11/24/2015   Procedure: CARDIOVERSION;  Surgeon: Loyde Rule, MD;  Location: [facility] ENDOSCOPY;  Service: Cardiovascular;  Laterality: N/A;   CARDIOVERSION N/A 12/20/2022   Procedure: CARDIOVERSION;  Surgeon: Bridgette Campus, MD;  Location: [facility] INVASIVE CV LAB;  Service: Cardiovascular;  Laterality: N/A;   CYSTOSCOPY WITH STENT PLACEMENT Bilateral 03/07/2019   Procedure: CYSTOSCOPY WITH BILATERAL FIREFLY INJECTION;  Surgeon: Andrez Banker, MD;  Location: [facility] ORS;  Service: Urology;  Laterality: Bilateral;   GASTROSTOMY TUBE PLACEMENT  07/04/2004   IR - G tube for tonsilar cancer   IR ANGIO INTRA EXTRACRAN SEL COM CAROTID INNOMINATE UNI R MOD SED  03/09/2019   IR ANGIO VERTEBRAL SEL VERTEBRAL UNI R MOD SED  03/09/2019   IR CT HEAD LTD  03/09/2019   IR PERCUTANEOUS ART THROMBECTOMY/INFUSION INTRACRANIAL INC DIAG ANGIO  03/09/2019   IR RADIOLOGIST EVAL & MGMT  12/18/2018   LAMINECTOMY     C5/placement of steel plate   LOOP RECORDER INSERTION N/A 06/19/2023   Procedure: LOOP RECORDER INSERTION;  Surgeon: Boyce Byes, MD;  Location: [facility] INVASIVE CV LAB;  Service: Cardiovascular;  Laterality: N/A;   NECK SURGERY  2003   replaced disk   RADIOLOGY WITH ANESTHESIA N/A 03/08/2019   Procedure: IR WITH ANESTHESIA;  Surgeon: Luellen Sages, MD;  Location: [facility] OR;  Service: Radiology;  Laterality: N/A;   TEE WITHOUT CARDIOVERSION N/A 10/15/2015   Procedure: TRANSESOPHAGEAL ECHOCARDIOGRAM (TEE);  Surgeon: Darlis Eisenmenger, MD;  Location: [facility] ENDOSCOPY;  Service: Cardiovascular;  Laterality: N/A;   TEE WITHOUT CARDIOVERSION N/A 11/24/2015   Procedure:  TRANSESOPHAGEAL ECHOCARDIOGRAM (TEE);  Surgeon: Loyde Rule, MD;  Location: [facility] ENDOSCOPY;  Service: Cardiovascular;  Laterality: N/A;   TEE WITHOUT CARDIOVERSION N/A 12/20/2022   Procedure: TRANSESOPHAGEAL ECHOCARDIOGRAM;  Surgeon: Bridgette Campus, MD;  Location: [facility] INVASIVE CV LAB;  Service: Cardiovascular;  Laterality: N/A;   XI ROBOTIC ASSISTED COLOSTOMY TAKEDOWN N/A 03/07/2019   Procedure: XI ROBOTIC ASSISTED LOW ANTERIOR RESECTION, RIGID PROCTOSCOPY;  Surgeon: Candyce Champagne, MD;  Location: [facility] ORS;  Service: General;  Laterality: N/A;   Patient Active Problem List   Diagnosis Date Noted   Carotid occlusion, left 10/23/2023   ICH (intracerebral hemorrhage) (HCC) 08/11/2023   Carotid artery stenosis    Atrial flutter (HCC) 12/19/2022   Essential hypertension 04/22/2021   Hypokalemia 03/12/2019   Expressive aphasia 03/10/2019   Dysphagia 03/10/2019   Middle cerebral artery embolism, left 03/09/2019   Diverticular stricture (HCC) 03/07/2019   Bacteria in urine    Chronic atrial fibrillation (HCC) 12/02/2018   Current use of long term anticoagulation 12/02/2018   UTI (urinary tract infection) 11/29/2018   Hyponatremia 11/29/2018   PAF (paroxysmal atrial fibrillation) (HCC) 11/29/2018   Diverticulitis of large intestine with abscess 09/23/2018   Dyslipidemia 12/05/2017   CAD (coronary artery disease), native coronary artery 12/06/2015   SOB (shortness of breath)    NICM (nonischemic cardiomyopathy) (HCC)    Thyroid  activity decreased    Hypothyroidism 08/12/2013   History of radiation therapy 05/30/2012   Smokes tobacco daily 05/30/2012   Cancer of tonsillar fossa (HCC) 11/27/2011   Tonsil cancer (HCC) 2005   History of chemotherapy 2005    ONSET DATE: 08/11/23  REFERRING DIAG: I61.0 (ICD-10-CM) - Nontraumatic subcortical hemorrhage of left cerebral hemisphere (HCC)   THERAPY DIAG:  Muscle weakness (generalized)  Other lack of coordination  Other abnormalities of gait and  mobility  Frontal lobe and executive function deficit  Attention and concentration deficit  Rationale for Evaluation and Treatment: Rehabilitation  SUBJECTIVE:   SUBJECTIVE STATEMENT: Pt reports his right arm is doing better  Pt accompanied by: self  PERTINENT HISTORY:   68 yo male hospitalized 08/11/23 with R sided weakness. CT showed a large basal ganglia ICH with IVH extension. PMH includes:  A-fib, carotid artery stenosis, CAD, tonsil cancer, hypothyroidism, L MCA CVA.    PRECAUTIONS: Fall  WEIGHT BEARING RESTRICTIONS: No  PAIN:  Are you having pain? No  FALLS: Has patient fallen in last 6 months? No  LIVING ENVIRONMENT: Lives with: lives alone has friends and ex wife checks in on him Lives in: House/apartment Stairs: no Has following equipment at home: Single point cane and Tour manager  PLOF: Independent  PATIENT GOALS: improve use of RUE  OBJECTIVE:  Note: Objective measures were completed at Evaluation unless otherwise noted.  HAND DOMINANCE: Right  ADLs: Overall ADLs: mod I with all basic ADLS Transfers/ambulation related to ADLs: Eating: uses RUE 20%, currently using non dominant LUE,  Grooming: using non dominant  LUE UB Dressing: mod I difficulty with buttons LB Dressing: mod I Toileting: mod I with cane Bathing: has tub bench Tub Shower transfers: mod I  Equipment: Transfer tub bench  IADLs: Shopping: needs assist for transportation Light housekeeping: Pt is performing light cleaning, he has occasional assist Meal Prep: microwave meals primarily, does a little stove top Medication management: pt handles  Financial management: pt handles Handwriting: 90% legibility, handwriting is small   MOBILITY STATUS:  mod I with cane  ACTIVITY TOLERANCE: Activity tolerance: standing  UPPER EXTREMITY ROM:  grossly WFLS  UPPER EXTREMITY MMT:     MMT Right eval Left eval  Shoulder flexion 3+/5 4+/5  Shoulder abduction    Shoulder adduction     Shoulder extension    Shoulder internal rotation    Shoulder external rotation    Middle trapezius    Lower trapezius    Elbow flexion 4-/5 4-/5  Elbow extension 4-/5 4-/5  Wrist flexion    Wrist extension    Wrist ulnar deviation    Wrist radial deviation    Wrist pronation    Wrist supination    (Blank rows = not tested)  HAND FUNCTION: Grip strength: Right: 50 lbs; Left: 63 lbs  COORDINATION: 9 Hole Peg test: Right: 59.69 sec; Left: 29.35 sec  SENSATION:diminished in RUE  COGNITION: Overall cognitive status:unable to spell WORLD backwards correctly, recalls 2/3 words following a delay.  VISION ASSESSMENT: Not tested- denies visual changes   OBSERVATIONS: Pleasant gentleman motivated to improve                                                                                                                              TREATMENT DATE: 12/05/23-Fine motor coordination task to put various sized pegs into pegboard, min v.c to avoid compensatory shoulder hike, removing pegs with in hand manipulation min difficulty/ v.c, Pt with improved dexterity today. Reveiwed red theraband HEP, ( shoulder horizontal abduction, biceps curls, triceps extension)10-15 reps each, min v.c for positioning. Pt performed shoulder extension  10 rpes bilateral UE's in standing with red band however pt requires mod v.c for positioning, so this was not issued for home. Therapist checked short term goals for PN.  12/03/23 Standing to copy small peg design on vertical surface, mod difficulty/ drops and min v.c, increased time required, min errors for design, min v.c for in hand manipulation Ambulating 125 ft while carrying plate in right hand while using cane in left min v.c no drops. Closed chain shoulder flexion, then digaonals each direction with left and right UE's, min v.c for positioning  11/28/23-UBE x 6 mins level 2 for conditioning, min v.c for speed and balanced use of UE's Mosaic picture design to  pick up various shaped pieces and manipulate to place in frame while completing a design for increased fine motor coordination with a cognitve component, min difficulty, min v.c for in hand manipulation and to avoid compensatory patterns. Increased time required for  this task. Education provided regarding memory compensations.  11/26/23- UBE x 6 mins level 3 for conditioning Copying small peg design with RUE for increased fine motor coordination with a cognitive component, min difficulty, increased time required, v.c  Reveiwed previously issued yellow theraband exercises, 10- 20 reps each, min v.c shoulder flexion with foam roll 15 reps to eye level with improved control Followed by shoulder flexion low to mid range with 1 lbs weight in each hand focus on control  11/21/23- Pt practiced placing screwing bolts into pegboard with RUE then using tools such as screw driver, Customer service manager and wrench to tighten items with RUE min-mod v.c, pt also practiced locking and unlocking a pad lock. Pt with mod difficulty using wrench, min difficulty for using screw driver. UBE x5 mins level 1 for conditioning  11/19/23 Closed chain shoulder flexion with foam roll 2 sets of 10 reps min v.c and facilitation for shoulder positioning and focus on control. Reviewed yellow theraband exercises 15 reps each bilateral UE's. Pt performed biceps  curls with bilateral UE's simultaneously with cues to have RUE mirror movements of LUE, min v.c Placing grooved pegs into pegboard with RUE for increased fine motor coordination, min difficulty/ v.c  Removing pegs with tweezers, for sustained pinch for 1/2 the items,  mod difficulty/ drops   11/15/23 UBE x 6 mins level 1 for conditioning closed chain shoulder flexion with medium ball x 15 reps then with foam roll for send set min v.c for control. Yellow theraband for biceps curls and triceps extension, 10-15 reps each UE, min v.c copying small peg design with RUE min-mod difficulty/  drops, min v.c to avoid compensation.  11/12/23- Reviewed cane HEP 10-15 reps min v.c Functional reaching to place and retrieve clothespins(1-8#) from overhead target(vertical yardstick) with RUE for functional reach and sustained pinch, min difficulty, v.c Placing metal pegs( 3 sizes) into pegboard with RUE then removing with in hand manipulation, min difficulty/ v.c  Flipping and dealing cards with RUE for increased fine motor coordination, min v.c   11/09/23: Pt instructed in cane HEP.  Pt fatigued quickly and with difficulty with control, but responded well to cueing.  Also reviewed and added to coordination HEP.  Pt able to copy shopping list with good legibility, but several spelling errors--pt reports difficulty with spelling if generating a list at home.   10/31/23- eval     PATIENT EDUCATION: Education details: red theraband exercises shoulder horizonatal abduction, biceps, triceps Person educated: Patient Education method: Explanation, Demonstration, Tactile cues, Verbal cues, Education comprehension: verbalized understanding, returned demonstration, and verbal cues required  HOME EXERCISE PROGRAM: beginning coordination 3/26 11/09/23  Cane HEP 4/10- biceps triceps with yellow band, shoulder flexion with paper towel roll  11/27/23- memory compensations GOALS: Goals reviewed with patient? Yes  SHORT TERM GOALS: Target date: 12/01/23  I with inital HEP Goal status: met for cane, beginning theraband 12/05/23  2.  Pt will report feeding himself at least 50% of the time with RUE. Baseline: uses 20% x Goal status:  met, per pt report 78% 11/28/23  3.  Pt will demonstrate improved RUE fine motor coordination for ADLs as evidenced by decreasing 9 hole peg test score to 52 secs or less Goal status: met , 43.26 secs  4.  Pt will report consistently brushing his teeth with RUE Goal status: ongoing, brushes teeth with RUE 50% of the time 12/05/23  5.  I with memory compensation  strategies Goal status: met, issued 11/28/23  6.  Pt  will report that he has resumed moderate level home management mod I Goal status:  ongoing, pt has started  washes dishes and does laundry but not back to prior levle. 12/05/23   LONG TERM GOALS: Target date: 01/23/24  I with updated HEP Goal status: INITIAL  2.  Pt will demonstrate improved RUE fine motor coordination for ADLs as evidenced by decreasing 9 hole peg test score to 48 secs or less Goal status: ongoing  3.  Pt will demonstrate ability to carry a plate in RUE without spills. Goal status: INITIAL  4.  Pt will write a short paragraph with 100% legibility and minimal decrease in letter size. Goal status: ongoing  5.  Pt will resume use of RUE as dominant hand at least 75% of the time for ADLs/ IADLs. Goal status: INITIAL  6. Pt will retrieve a 3 lbs object from eye level shelf using RUE with good control  Goal status: inital  ASSESSMENT:  CLINICAL IMPRESSION: For the reporting period to 10/31/23- 12/05/23, pt demonstrates good overall progress. He has achieved 4/6 short term goals and is progressing towards remaining goals. Pt can benefit from skilled occupational therapy to address :RUE strength, coordiantion, functional use, balcnace and functional mobility in order to maximize pt's safety and I with ADLs/IADLs.PERFORMANCE DEFICITS: in functional skills including ADLs, IADLs, coordination, dexterity, sensation, strength, flexibility, Fine motor control, Gross motor control, mobility, balance, endurance, decreased knowledge of precautions, decreased knowledge of use of DME, and UE functional use, cognitive skills including attention, memory, problem solving, safety awareness, and thought, and psychosocial skills including coping strategies, environmental adaptation, habits, interpersonal interactions, and routines and behaviors.   IMPAIRMENTS: are limiting patient from ADLs, IADLs, play, leisure, and social participation.    CO-MORBIDITIES: may have co-morbidities  that affects occupational performance. Patient will benefit from skilled OT to address above impairments and improve overall function.  MODIFICATION OR ASSISTANCE TO COMPLETE EVALUATION: No modification of tasks or assist necessary to complete an evaluation.  OT OCCUPATIONAL PROFILE AND HISTORY: Detailed assessment: Review of records and additional review of physical, cognitive, psychosocial history related to current functional performance.  CLINICAL DECISION MAKING: LOW - limited treatment options, no task modification necessary  REHAB POTENTIAL: Good  EVALUATION COMPLEXITY: Low    PLAN:  OT FREQUENCY: 2x/week  OT DURATION: 12 weeks  PLANNED INTERVENTIONS: 97168 OT Re-evaluation, 97535 self care/ADL training, 16109 therapeutic exercise, 97530 therapeutic activity, 97112 neuromuscular re-education, 97140 manual therapy, 97113 aquatic therapy, 97035 ultrasound, 97018 paraffin, 60454 moist heat, 97010 cryotherapy, 97034 contrast bath, 97014 electrical stimulation unattended, 97129 Cognitive training (first 15 min), 09811 Cognitive training(each additional 15 min), 91478 Orthotics management and training, 29562 Splinting (initial encounter), passive range of motion, balance training, functional mobility training, energy conservation, coping strategies training, patient/family education, and DME and/or AE instructions  RECOMMENDED OTHER SERVICES: n/a  CONSULTED AND AGREED WITH PLAN OF CARE: Patient  PLAN FOR NEXT SESSION: work towards long term goals, RUE coordiantion, control.   Bettyjo Lundblad, OTR/L 12/05/2023, 12:26 PM

## 2023-12-11 ENCOUNTER — Ambulatory Visit: Attending: Physical Medicine & Rehabilitation | Admitting: Physical Therapy

## 2023-12-11 ENCOUNTER — Encounter: Payer: Self-pay | Admitting: Physical Therapy

## 2023-12-11 ENCOUNTER — Encounter: Payer: Self-pay | Admitting: Speech Pathology

## 2023-12-11 ENCOUNTER — Ambulatory Visit: Admitting: Speech Pathology

## 2023-12-11 ENCOUNTER — Ambulatory Visit: Admitting: Occupational Therapy

## 2023-12-11 DIAGNOSIS — R2689 Other abnormalities of gait and mobility: Secondary | ICD-10-CM

## 2023-12-11 DIAGNOSIS — M6281 Muscle weakness (generalized): Secondary | ICD-10-CM | POA: Diagnosis present

## 2023-12-11 DIAGNOSIS — R262 Difficulty in walking, not elsewhere classified: Secondary | ICD-10-CM | POA: Diagnosis present

## 2023-12-11 DIAGNOSIS — R2681 Unsteadiness on feet: Secondary | ICD-10-CM | POA: Diagnosis present

## 2023-12-11 DIAGNOSIS — R4701 Aphasia: Secondary | ICD-10-CM

## 2023-12-11 DIAGNOSIS — R41844 Frontal lobe and executive function deficit: Secondary | ICD-10-CM | POA: Diagnosis present

## 2023-12-11 DIAGNOSIS — R41841 Cognitive communication deficit: Secondary | ICD-10-CM | POA: Diagnosis present

## 2023-12-11 DIAGNOSIS — R41840 Attention and concentration deficit: Secondary | ICD-10-CM | POA: Diagnosis present

## 2023-12-11 DIAGNOSIS — R278 Other lack of coordination: Secondary | ICD-10-CM | POA: Diagnosis present

## 2023-12-11 NOTE — Therapy (Signed)
 OUTPATIENT SPEECH LANGUAGE PATHOLOGY TREATMENT   Patient Name: Adrian [name] MRN: [id number] DOB:05-28-1956, 68 y.o., male Today's Date: 12/11/2023  PCP: NA REFERRING PROVIDER: Genetta Kenning, MD  Speech Therapy Progress Note  Dates of Reporting Period: 11/07/23 to present  Subjective Statement: Pt feels frustrated with language impairment  Objective: See tx notes  Goal Update: See tx notes; slow progress towards goals.   Plan: Continue with current POC  Reason Skilled Services are Required: To maximize functional communication and increase confidence  END OF SESSION:  End of Session - 12/11/23 0935     Visit Number 10    Number of Visits 17    Date for SLP Re-Evaluation 01/07/24    SLP Start Time 0931    SLP Stop Time  1011    SLP Time Calculation (min) 40 min    Activity Tolerance Patient tolerated treatment well             Past Medical History:  Diagnosis Date   AF (paroxysmal atrial fibrillation) (HCC) 11/29/2018   Atrial flutter (HCC) 10/12/2015   a. TEE 3/17 with ? LAA clot-->s/p TEE/DCCV 11/24/2015; s/p DCCV 12/2022;s/p ablation 06/2023   CAD (coronary artery disease), native coronary artery 12/06/2015   a. 10/2015 MV: EF  37%, reversible defect inferior apex, intermediate risk findings; b. 10/2015 Cath: 20% mid RCA.   Carotid artery stenosis    1-39% right ICA stenosis and occluded left ICA   Colonic diverticular abscess    Diverticulitis    History of chemotherapy 2005   Cisplatin   Hypothyroidism    NICM (nonischemic cardiomyopathy) (HCC) 10/14/2015   a. Tachy mediated?;  b. Echo 3/17 - Mild concentric LVH, EF 30-35%, anteroseptal, anterior, anterolateral, apical anterior, lateral hypokinesis, trivial MR, mild to moderately reduced RVSF; c. LHC 3/17 - mRCA 20%   Radiation NOv.3,2005-Dec. 15, 2005   6810 cGy in 30 fractions   Tonsil cancer [facility]) 2005   Dr Ava Lei.  XRT   Past Surgical History:  Procedure Laterality Date   A-FLUTTER ABLATION  N/A 06/19/2023   Procedure: A-FLUTTER ABLATION;  Surgeon: Boyce Byes, MD;  Location: [facility] INVASIVE CV LAB;  Service: Cardiovascular;  Laterality: N/A;   APPENDECTOMY N/A 03/07/2019   Procedure: ROBOTIC ASSISTED APPENDECTOMY;  Surgeon: Candyce Champagne, MD;  Location: [facility] ORS;  Service: General;  Laterality: N/A;   CARDIAC CATHETERIZATION N/A 10/18/2015   Procedure: Left Heart Cath and Coronary Angiography;  Surgeon: Odie Benne, MD;  Location: [facility] INVASIVE CV LAB;  Service: Cardiovascular;  Laterality: N/A;   CARDIOVERSION N/A 11/24/2015   Procedure: CARDIOVERSION;  Surgeon: Loyde Rule, MD;  Location: [facility] ENDOSCOPY;  Service: Cardiovascular;  Laterality: N/A;   CARDIOVERSION N/A 12/20/2022   Procedure: CARDIOVERSION;  Surgeon: Bridgette Campus, MD;  Location: [facility] INVASIVE CV LAB;  Service: Cardiovascular;  Laterality: N/A;   CYSTOSCOPY WITH STENT PLACEMENT Bilateral 03/07/2019   Procedure: CYSTOSCOPY WITH BILATERAL FIREFLY INJECTION;  Surgeon: Andrez Banker, MD;  Location: [facility] ORS;  Service: Urology;  Laterality: Bilateral;   GASTROSTOMY TUBE PLACEMENT  07/04/2004   IR - G tube for tonsilar cancer   IR ANGIO INTRA EXTRACRAN SEL COM CAROTID INNOMINATE UNI R MOD SED  03/09/2019   IR ANGIO VERTEBRAL SEL VERTEBRAL UNI R MOD SED  03/09/2019   IR CT HEAD LTD  03/09/2019   IR PERCUTANEOUS ART THROMBECTOMY/INFUSION INTRACRANIAL INC DIAG ANGIO  03/09/2019   IR RADIOLOGIST EVAL & MGMT  12/18/2018   LAMINECTOMY  C5/placement of steel plate   LOOP RECORDER INSERTION N/A 06/19/2023   Procedure: LOOP RECORDER INSERTION;  Surgeon: Boyce Byes, MD;  Location: [facility] INVASIVE CV LAB;  Service: Cardiovascular;  Laterality: N/A;   NECK SURGERY  2003   replaced disk   RADIOLOGY WITH ANESTHESIA N/A 03/08/2019   Procedure: IR WITH ANESTHESIA;  Surgeon: Luellen Sages, MD;  Location: [facility] OR;  Service: Radiology;  Laterality: N/A;   TEE WITHOUT CARDIOVERSION N/A 10/15/2015   Procedure: TRANSESOPHAGEAL  ECHOCARDIOGRAM (TEE);  Surgeon: Darlis Eisenmenger, MD;  Location[facility] ENDOSCOPY;  Service: Cardiovascular;  Laterality: N/A;   TEE WITHOUT CARDIOVERSION N/A 11/24/2015   Procedure: TRANSESOPHAGEAL ECHOCARDIOGRAM (TEE);  Surgeon: Loyde Rule, MD;  Location: [facility] ENDOSCOPY;  Service: Cardiovascular;  Laterality: N/A;   TEE WITHOUT CARDIOVERSION N/A 12/20/2022   Procedure: TRANSESOPHAGEAL ECHOCARDIOGRAM;  Surgeon: Bridgette Campus, MD;  Location: [facility] INVASIVE CV LAB;  Service: Cardiovascular;  Laterality: N/A;   XI ROBOTIC ASSISTED COLOSTOMY TAKEDOWN N/A 03/07/2019   Procedure: XI ROBOTIC ASSISTED LOW ANTERIOR RESECTION, RIGID PROCTOSCOPY;  Surgeon: Candyce Champagne, MD;  Location: [facility] ORS;  Service: General;  Laterality: N/A;   Patient Active Problem List   Diagnosis Date Noted   Carotid occlusion, left 10/23/2023   ICH (intracerebral hemorrhage) (HCC) 08/11/2023   Carotid artery stenosis    Atrial flutter (HCC) 12/19/2022   Essential hypertension 04/22/2021   Hypokalemia 03/12/2019   Expressive aphasia 03/10/2019   Dysphagia 03/10/2019   Middle cerebral artery embolism, left 03/09/2019   Diverticular stricture (HCC) 03/07/2019   Bacteria in urine    Chronic atrial fibrillation (HCC) 12/02/2018   Current use of long term anticoagulation 12/02/2018   UTI (urinary tract infection) 11/29/2018   Hyponatremia 11/29/2018   PAF (paroxysmal atrial fibrillation) (HCC) 11/29/2018   Diverticulitis of large intestine with abscess 09/23/2018   Dyslipidemia 12/05/2017   CAD (coronary artery disease), native coronary artery 12/06/2015   SOB (shortness of breath)    NICM (nonischemic cardiomyopathy) (HCC)    Thyroid  activity decreased    Hypothyroidism 08/12/2013   History of radiation therapy 05/30/2012   Smokes tobacco daily 05/30/2012   Cancer of tonsillar fossa (HCC) 11/27/2011   Tonsil cancer (HCC) 2005   History of chemotherapy 2005    ONSET DATE: Referred on 10/26/23 (CVA in Jan)   REFERRING DIAG:  I61.0 (ICD-10-CM) - Nontraumatic subcortical hemorrhage of left cerebral hemisphere [facility])   THERAPY DIAG:  Cognitive communication deficit  Aphasia  Rationale for Evaluation and Treatment: Rehabilitation  SUBJECTIVE:   SUBJECTIVE STATEMENT: Pt has been trying word finding strategies at home.   Pt accompanied by: self  PERTINENT HISTORY: Per chart review: 68 y.o. male with history of atrial fibrillation, on Eliquis , carotid artery stenosis, hypothyroidism , CVA 1/25  PAIN:  Are you having pain? No  FALLS: Has patient fallen in last 6 months?  No  LIVING ENVIRONMENT: Lives with: lives alone Lives in: House/apartment Ex Wife will be driving to appointments (or friends) Sheryle Donning  PLOF:  Level of assistance: Independent with ADLs, Independent with IADLs Employment: Retired; [employer]   PATIENT GOALS: memory, word finding, speech   OBJECTIVE:  Note: Objective measures were completed at Evaluation unless otherwise noted.  DIAGNOSTIC FINDINGS: Per Chart Review  CT HEAD CODE STROKE WO CONTRAST 08/11/23  IMPRESSION: 1.8 x 2.0 x 1.1 cm parenchymal hemorrhage centered in the left basal ganglia with intraventricular extension. No evidence of hydrocephalus at the time of exam. No significant midline shift.   Findings were  paged to Dr. Murvin Arthurs on 08/11/23 at 2:29 PM     Electronically Signed   By: Clora Dane M.D.   On: 08/11/2023 14:31  COGNITION: Overall cognitive status: Impaired Areas of impairment:  Attention: Impaired: Selective, Alternating, Divided Memory: Impaired: Short term Prospective Executive function: Impaired: Problem solving, Organization, Planning, and Slow processing Functional deficits: Reports difficulty with recall of important information  AUDITORY COMPREHENSION: Overall auditory comprehension: Appears intact YES/NO questions: Appears intact Following directions: Appears intact Conversation: Complex Interfering components: attention and processing  speed Effective technique: extra processing time, pausing, and repetition/stressing words  READING COMPREHENSION: Intact  EXPRESSION: verbal  VERBAL EXPRESSION: Level of generative/spontaneous verbalization: conversation  Comments: Anomia  Non-verbal means of communication: N/A  WRITTEN EXPRESSION: Dominant hand: right Written expression: Not tested  MOTOR SPEECH: Overall motor speech: impaired Level of impairment: Conversation Respiration:  WFL Phonation: normal Resonance: WFL Articulation: Appears intact; pt reports slurred speech when feeling tired Intelligibility: Intelligible; 100% intelligible at eval; reports worsening speech when fatigued Motor planning: Appears intact Motor speech errors:  NA Interfering components:  NA Effective technique: slow rate and over articulate  ORAL MOTOR EXAMINATION: Overall status: Impaired:   Labial: Right (Symmetry) Facial: Right (Symmetry) *ROM is good. At rest, facial droop on R side. Comments: NA   STANDARDIZED ASSESSMENTS: Initiated CLQT - to complete next session    Cognitive Linguistic Quick Test: AGE - 18 - 69   The Cognitive Linguistic Quick Test (CLQT) was administered to assess the relative status of five cognitive domains: attention, memory, language, executive functioning, and visuospatial skills. Scores from 10 tasks were used to estimate severity ratings (standardized for age groups 18-69 years and 70-89 years) for each domain, a clock drawing task, as well as an overall composite severity rating of cognition.       Task Score Criterion Cut Scores  Personal Facts 7/8 8  Symbol Cancellation 11/12 11  Confrontation Naming 10/10 10  Clock Drawing  11/13 12  Story Retelling 6/10 6  Symbol Trails 10/10 9  Generative Naming 3/9 5  Design Memory 6/6 5  Mazes  8/8 7  Design Generation 5/13 6     Cognitive Domain Composite Score Severity Rating  Attention 190/215 WNL  Memory 148/185 Mild  Executive Function  26/40 WNL  Language 26/37 Mild  Visuospatial Skills 95/105 WNL  Clock Drawing  11/13 Mild  Composite Severity Rating  WNL           PATIENT REPORTED OUTCOME MEASURES (PROM): Neuro QOL - Cognitive Short Form: 29                                                                                                                             TREATMENT DATE:   12/11/23: Pt was seen for skilled ST services targeting cognitive-communication. Pt reports he has been attempting word finding strategies at home - but feels like he should challenge himself to do it without using strategies. SLP explained  the purpose of strategies is to improve ability to communicate thoughts/word more efficiently. Pt reports he gets frustrated that he is not able to do it as quickly as he would like. SLP completed education on attention strategies - pt reports "I am not really doing anything at home or around a bunch of people", and does not feel like he could benefit from these. To continue with word finding practice next session.   12/05/23: Pt was seen for skilled ST services targeting cognitive-communication. Pt reports he has not been trying to use word finding strategies at home. SLP to continue practice of word finding strategies to encourage transfer of strategies to home/community. SLP initiated education on attention strategies this session re: managing fatigue - spoon theory, managing distractions, brain breaks. To cont with strategy education next session.    12/03/23: Pt was seen for skilled ST services. Pt reports speech intelligibility has been good, but still struggling when feeling more tired. He reports he has been mindful of dysarthria strategies during these times re: pausing and increasing loudness. Pt feels word finding is the most difficult for him at this time and would like to focus on this. He reports he also has trouble understanding the TV. SLP suggested putting subtitles on the TV to see if this is  helpful. Pt reported he had an instance where he could not think of the name "iris". Pt did not use a strategy in this instance. SLP suggested trying "look it up" and demonstrated how to complete this on google. SLP facilitated use of "look it up" strategy with patient using iphone. Pt was able to generate adequate descriptions to find missing words for [employer] Day, his favorite pizza place. Pt participated in informal conversation with no instances of anomia.   11/28/23: Pt was seen for skilled ST services targeting cognitive-communication. SLP edu on using microphone speech-to-text to make notes to support memory. Pt reports he did not do any writing since he was last here - but he has been thinking about it. SLP included writing in today's therapeutic task re: creation of dysarthria BE CLEAR sentences. Pt was able to generate and produce sentences with intelligible speech independently. Pt's speech continues to be 100% intelligible; however, pt reports this is not the case when he is fatigued. He understands this is when he should be most cognizant of using dysarthria strategies. Pt was tasked with writing 5, SLP-dictated, functional sentences. Pt required modA to spell words correctly. Pt was demonstrate use of speech to text to assist in error correction of sentences.   11/26/23: Pt was seen for skilled ST services targeting cognitive-communication. Pt reports he used "synonym" word finding strategy in an instance of anomia over the week. Pt reports he hasn't had any other significant difficulty with word finding. We reviewed "Be Clear" strategies for dysarthria re: speaking louder, over-articulating, slow down. SLP checked-in on pt's feelings regarding cognitive difficulties. He reports he hasn't noticed any significant problems; however, he realizes he has forgotten to bring his folder in x2. SLP suggested writing down tasks. Pt reported difficulty with writing from his previous strokes. SLP suggested  using speech to text. Pt reported he has been leaving his cell phone at home. SLP encouraged him to always keep his cell nearby in case of emergencies, as well as, a memory tool. Pt attempted to write list of tasks needed to complete. Spelling errors noted. SLP encouraged pt to spell the word in his head prior to writing it down vs dividing attention  between trying to figure out how to spell the word while attempting to write. Pt required max cue reminders to first spell the word in his head before attempting to write. Pt was noted to reduce errors by ~50% using therapist strategy.   11/20/23: Pt was seen for skilled ST services targeting cognitive-communication and word finding strategies. Pt completed Cognitive PROMs measure - see above in blue. Pt reports he hasn't had any significant instances of anomia - given a 2-3 second delay, word typically pops out. Pt reports he is to go golfing (on the putting green) with a friend for his birthday. We discussed word finding strategies he could use in conversation. SLP facilitated word finding practice using multiple meaning words. Pt was tasked with finding the synonym (or a phrase close) for the given word. SLP challenged the patient to find synonyms to different golf terms in preparation for his outing. SLP briefly educated on "Be Clear" for dysarthria. To continue next session.   11/15/23: Pt was seen for skilled ST services targeting cognitive-communication and word finding strategies. SLP initiated education on word finding. SLP provided examples of each strategies and how to use in conversation. Pt reported he feels he would like to the following word finding strategies re: delay, describe, association, look it up. Pt was noted to have errors when reading aloud which was impacting his reading comprehension. Pt was noted to delete sounds from words  and add functor words while reading. SLP trialed re: pointing to each word with pen, re-reading aloud, re-reading  silently to remove expressive component. To demonstrate "describe" strategy through Semantic Feature Analysis (SFA) next session.   11/09/23: Pt was seen for skilled ST services targeting completed cognitive-communication testing and education. SLP reviewed CLQT results with patient. He had no further questions. SLP provided pt with cognitive-communication handout. Pt reported difficulty with re: recalling details in conversations, understanding TV shows, difficulty with expressing thoughts, and reading/writing.  Pt has residual aphasia from previous stroke. To provide strategies in this POC.   11/07/23: Provided brief education on cognitive-communication disorder and speech intelligibility.   PATIENT EDUCATION: Education details: SLP role in cognitive-communication impairments Person educated: Patient Education method: Explanation Education comprehension: needs further education   GOALS: Goals reviewed with patient? No; to update after further evaluation  SHORT TERM GOALS: Target date: 12/08/23  Continue Cognitive testing to determine needs Baseline: Goal status: MET  2.  Complete PROMs measure Baseline:  Goal status: MET  3.  Pt will recall 3 word finding strategies to use in instances of anomia with independently. Baseline:  Goal status: INITIAL  4.  Pt will verbalize 2 attention strategies to improve concentration during functional tasks.  Baseline:  Goal status: INITIAL  5. Pt will verbalize 2 memory strategies for recall of important information.  Baseline:  Goal status: INITIAL    LONG TERM GOALS: Target date: 01/08/24  Pt will improve score on PROMs measure Baseline:  Goal status: INITIAL  2.  Pt will report successful use of word finding strategies at home and in the community. Baseline:  Goal status: INITIAL  3.  Pt will report successful use of attention strategies at home and in the community. Baseline:  Goal status: INITIAL  4.  Pt will report successful use  of memory strategies at home and in the community.  Baseline:  Goal status: INITIAL   ASSESSMENT:  CLINICAL IMPRESSION: Pt is a 68 yo male who presents to ST OP for evaluation post recent CVA in Jan. Pt  lives alone and will be driven to appointments by ex-wife or friends. Pt endorses speech changes, in addition to sx associated with cognitive-communication challenges [id number] of new information, word finding, and slowed processing.  Pt was assessed using CLQT - to complete next session. SLP observed difficulty with short term memory, instances of anomia, and very mild dysarthria. Pt was 100% intelligible in quiet, therapy room to an unfamiliar listener. He reports speech is worse when feeling tired. SLP to provide strategies for pt use in moments where speech may be unclear. Pt reports no difficulty with swallowing at this time and is tolerating a regular/thin diet. SLP rec skilled ST services to address cognitive-communication impairment and dysarthria to maximize functional independence and increase QOL.     OBJECTIVE IMPAIRMENTS: include memory, executive functioning, expressive language, and dysarthria. These impairments are limiting patient from effectively communicating at home and in community. Factors affecting potential to achieve goals and functional outcome are  NA . Patient will benefit from skilled SLP services to address above impairments and improve overall function.  REHAB POTENTIAL: Good  PLAN:  SLP FREQUENCY: 1-2x/week  SLP DURATION: 8 weeks  PLANNED INTERVENTIONS: Environmental controls, Cueing hierachy, Internal/external aids, Functional tasks, SLP instruction and feedback, Compensatory strategies, Patient/family education, and [id number] Treatment of speech (30 or 45 min)     [employer], CCC-SLP 12/11/2023, 9:37 AM

## 2023-12-11 NOTE — Therapy (Signed)
 OUTPATIENT PHYSICAL THERAPY TREATMENT    Patient Name: Adrian [name] MRN: [id number] DOB:11-26-1955, 68 y.o., male Today's Date: 12/11/2023      END OF SESSION:  PT End of Session - 12/11/23 1143     Visit Number 12    Number of Visits 17    Date for PT Re-Evaluation 12/26/23    Authorization Type MCR    Authorization Time Period 10/31/23 to 12/26/23    Progress Note Due on Visit 20    PT Start Time 1059    PT Stop Time 1140    PT Time Calculation (min) 41 min    Activity Tolerance Patient tolerated treatment well    Behavior During Therapy [facility] for tasks assessed/performed                        Past Medical History:  Diagnosis Date   AF (paroxysmal atrial fibrillation) (HCC) 11/29/2018   Atrial flutter (HCC) 10/12/2015   a. TEE 3/17 with ? LAA clot-->s/p TEE/DCCV 11/24/2015; s/p DCCV 12/2022;s/p ablation 06/2023   CAD (coronary artery disease), native coronary artery 12/06/2015   a. 10/2015 MV: EF  37%, reversible defect inferior apex, intermediate risk findings; b. 10/2015 Cath: 20% mid RCA.   Carotid artery stenosis    1-39% right ICA stenosis and occluded left ICA   Colonic diverticular abscess    Diverticulitis    History of chemotherapy 2005   Cisplatin   Hypothyroidism    NICM (nonischemic cardiomyopathy) (HCC) 10/14/2015   a. Tachy mediated?;  b. Echo 3/17 - Mild concentric LVH, EF 30-35%, anteroseptal, anterior, anterolateral, apical anterior, lateral hypokinesis, trivial MR, mild to moderately reduced RVSF; c. LHC 3/17 - mRCA 20%   Radiation NOv.3,2005-Dec. 15, 2005   6810 cGy in 30 fractions   Tonsil cancer [facility]) 2005   Dr Ava Lei.  XRT   Past Surgical History:  Procedure Laterality Date   A-FLUTTER ABLATION N/A 06/19/2023   Procedure: A-FLUTTER ABLATION;  Surgeon: Boyce Byes, MD;  Location: [facility] INVASIVE CV LAB;  Service: Cardiovascular;  Laterality: N/A;   APPENDECTOMY N/A 03/07/2019   Procedure: ROBOTIC ASSISTED APPENDECTOMY;   Surgeon: Candyce Champagne, MD;  Location: [facility] ORS;  Service: General;  Laterality: N/A;   CARDIAC CATHETERIZATION N/A 10/18/2015   Procedure: Left Heart Cath and Coronary Angiography;  Surgeon: Odie Benne, MD;  Location: [facility] INVASIVE CV LAB;  Service: Cardiovascular;  Laterality: N/A;   CARDIOVERSION N/A 11/24/2015   Procedure: CARDIOVERSION;  Surgeon: Loyde Rule, MD;  Location: [facility] ENDOSCOPY;  Service: Cardiovascular;  Laterality: N/A;   CARDIOVERSION N/A 12/20/2022   Procedure: CARDIOVERSION;  Surgeon: Bridgette Campus, MD;  Location: [facility] INVASIVE CV LAB;  Service: Cardiovascular;  Laterality: N/A;   CYSTOSCOPY WITH STENT PLACEMENT Bilateral 03/07/2019   Procedure: CYSTOSCOPY WITH BILATERAL FIREFLY INJECTION;  Surgeon: Andrez Banker, MD;  Location: [facility] ORS;  Service: Urology;  Laterality: Bilateral;   GASTROSTOMY TUBE PLACEMENT  07/04/2004   IR - G tube for tonsilar cancer   IR ANGIO INTRA EXTRACRAN SEL COM CAROTID INNOMINATE UNI R MOD SED  03/09/2019   IR ANGIO VERTEBRAL SEL VERTEBRAL UNI R MOD SED  03/09/2019   IR CT HEAD LTD  03/09/2019   IR PERCUTANEOUS ART THROMBECTOMY/INFUSION INTRACRANIAL INC DIAG ANGIO  03/09/2019   IR RADIOLOGIST EVAL & MGMT  12/18/2018   LAMINECTOMY     C5/placement of steel plate   LOOP RECORDER INSERTION N/A 06/19/2023   Procedure: LOOP RECORDER  INSERTION;  Surgeon: Boyce Byes, MD;  Location: [facility] INVASIVE CV LAB;  Service: Cardiovascular;  Laterality: N/A;   NECK SURGERY  2003   replaced disk   RADIOLOGY WITH ANESTHESIA N/A 03/08/2019   Procedure: IR WITH ANESTHESIA;  Surgeon: Luellen Sages, MD;  Location: [facility] OR;  Service: Radiology;  Laterality: N/A;   TEE WITHOUT CARDIOVERSION N/A 10/15/2015   Procedure: TRANSESOPHAGEAL ECHOCARDIOGRAM (TEE);  Surgeon: Darlis Eisenmenger, MD;  Location: [facility] ENDOSCOPY;  Service: Cardiovascular;  Laterality: N/A;   TEE WITHOUT CARDIOVERSION N/A 11/24/2015   Procedure: TRANSESOPHAGEAL ECHOCARDIOGRAM (TEE);  Surgeon: Loyde Rule, MD;  Location: [facility] ENDOSCOPY;  Service: Cardiovascular;  Laterality: N/A;   TEE WITHOUT CARDIOVERSION N/A 12/20/2022   Procedure: TRANSESOPHAGEAL ECHOCARDIOGRAM;  Surgeon: Bridgette Campus, MD;  Location: [facility] INVASIVE CV LAB;  Service: Cardiovascular;  Laterality: N/A;   XI ROBOTIC ASSISTED COLOSTOMY TAKEDOWN N/A 03/07/2019   Procedure: XI ROBOTIC ASSISTED LOW ANTERIOR RESECTION, RIGID PROCTOSCOPY;  Surgeon: Candyce Champagne, MD;  Location: [facility] ORS;  Service: General;  Laterality: N/A;   Patient Active Problem List   Diagnosis Date Noted   Carotid occlusion, left 10/23/2023   ICH (intracerebral hemorrhage) (HCC) 08/11/2023   Carotid artery stenosis    Atrial flutter (HCC) 12/19/2022   Essential hypertension 04/22/2021   Hypokalemia 03/12/2019   Expressive aphasia 03/10/2019   Dysphagia 03/10/2019   Middle cerebral artery embolism, left 03/09/2019   Diverticular stricture (HCC) 03/07/2019   Bacteria in urine    Chronic atrial fibrillation (HCC) 12/02/2018   Current use of long term anticoagulation 12/02/2018   UTI (urinary tract infection) 11/29/2018   Hyponatremia 11/29/2018   PAF (paroxysmal atrial fibrillation) (HCC) 11/29/2018   Diverticulitis of large intestine with abscess 09/23/2018   Dyslipidemia 12/05/2017   CAD (coronary artery disease), native coronary artery 12/06/2015   SOB (shortness of breath)    NICM (nonischemic cardiomyopathy) (HCC)    Thyroid  activity decreased    Hypothyroidism 08/12/2013   History of radiation therapy 05/30/2012   Smokes tobacco daily 05/30/2012   Cancer of tonsillar fossa (HCC) 11/27/2011   Tonsil cancer (HCC) 2005   History of chemotherapy 2005    PCP: Glean Lamy MD   REFERRING PROVIDER: Genetta Kenning, MD  REFERRING DIAG: I61.0 (ICD-10-CM) - Nontraumatic subcortical hemorrhage of left cerebral hemisphere [facility])  THERAPY DIAG:  Muscle weakness (generalized)  Other lack of coordination  Other abnormalities of gait and  mobility  Difficulty in walking, not elsewhere classified  Unsteadiness on feet  Rationale for Evaluation and Treatment: Rehabilitation  ONSET DATE: 08/11/23  SUBJECTIVE:   SUBJECTIVE STATEMENT:  Nothing new, no pain. Energy level is about 50% this morning.   PERTINENT HISTORY: 1.  History of left basal ganglia hemorrhage approximately 2 and half months ago.  He has completed inpatient rehabilitation as well as home health.  I do think he would be a great candidate for outpatient therapy.  He has gotten back to modified independent level but still has some mild weakness on the right side as well as fine motor issues and motor control issues.  His balance is also off.  He continues wear right AFO because of foot and ankle weakness.  Patient has some short-term memory issues as well Will make referral for outpatient PT OT and speech therapy Physical medicine rehab follow-up in 3 months  PAIN:  Are you having pain? No  0/10 now  PRECAUTIONS: Fall and Other: R hemi, has loop recorder   RED FLAGS: None  WEIGHT BEARING RESTRICTIONS: No  FALLS:  Has patient fallen in last 6 months? No  LIVING ENVIRONMENT: Lives with: lives alone Lives in: House/apartment Stairs: 2 STE with rail  Has following equipment at home: Single point cane and Environmental consultant - 2 wheeled  OCCUPATION: retired- used to work for [employer]   PLOF: Independent, Independent with basic ADLs, Independent with gait, Independent with transfers, and Requires assistive device for independence  PATIENT GOALS: walk straight without a cane, get back to driving and motorcycling   NEXT MD VISIT: Referring 01/25/24  OBJECTIVE:  Note: Objective measures were completed at Evaluation unless otherwise noted.  DIAGNOSTIC FINDINGS:   CLINICAL DATA:  Stroke, follow up Neuro deficit, acute, stroke suspected   EXAM: MRI HEAD WITHOUT AND WITH CONTRAST   TECHNIQUE: Multiplanar, multiecho pulse sequences of the brain and  surrounding structures were obtained without and with intravenous contrast.   CONTRAST:  6.5mL GADAVIST  GADOBUTROL  1 MMOL/ML IV SOLN   COMPARISON:  CT head from today.   FINDINGS: Brain: When comparing across modalities, no substantial change in intraparenchymal hemorrhage in the left basal ganglia with intraventricular extension. No visible surrounding acute hemorrhage or mass lesion; however, acute blood products limits assessment. Prior left frontal infarct with encephalomalacia. No hydrocephalus. No pathologic enhancement.   Vascular: Major arterial flow voids are maintained at the skull base.   Skull and upper cervical spine: Normal marrow signal.   Sinuses/Orbits: Right frontal sinus opacification. Remaining sinuses are clear. No acute orbital findings.   Other: No mastoid effusions.   IMPRESSION: 1. When comparing across modalities, no substantial change in intraparenchymal hemorrhage in the left basal ganglia with intraventricular extension. No progressive mass effect. 2. No visible surrounding acute hemorrhage or mass lesion; however, acute blood products limits assessment  EXAM: CT HEAD WITHOUT CONTRAST   TECHNIQUE: Contiguous axial images were obtained from the base of the skull through the vertex without intravenous contrast.   RADIATION DOSE REDUCTION: This exam was performed according to the departmental dose-optimization program which includes automated exposure control, adjustment of the mA and/or kV according to patient size and/or use of iterative reconstruction technique.   COMPARISON:  Head CT 08/12/2023   FINDINGS: Brain: Interval decrease in size of the previously seen hemorrhage in the left basal ganglia, now measuring 10 x 7 mm, previously 20 x 12 mm. There is resolution of intraventricular blood products. No hydrocephalus. No extra-axial fluid collection. No mass effect. No mass lesion. No CT evidence of an acute cortical infarct. There  is chronic left MCA territory infarct involving the left opercular region.   Vascular: No hyperdense vessel or unexpected calcification.   Skull: Normal. Negative for fracture or focal lesion.   Sinuses/Orbits: No middle ear or mastoid effusion. Paranasal sinuses are notable for complete opacification of the right frontal sinus. Orbits are unremarkable.   Other: None.   IMPRESSION: No acute intracranial abnormality. Interval decrease in size of left basal ganglia hemorrhage, now measuring 10 x 7 mm, previously 20 x 12 mm. Resolution of intraventricular blood products. No hydrocephalus.    PATIENT SURVEYS:    Patient-Specific Activity Scoring Scheme  "0" represents "unable to perform." "10" represents "able to perform at prior level. 0 1 2 3 4 5 6 7 8 9  10 (Date and Score)   Activity Eval  12/03/23   1. Walking straight without the cane  4   6  2. Strength in RLE   4 6   3. Coordinated movements of R LE  3  6  4.    5.    Score 3.7 6   Total score = sum of the activity scores/number of activities Minimum detectable change (90%CI) for average score = 2 points Minimum detectable change (90%CI) for single activity score = 3 points     COGNITION: Overall cognitive status: Within functional limits for tasks assessed     SENSATION: Not tested      LOWER EXTREMITY MMT:  MMT Right eval Left eval  Hip flexion 4 5  Hip extension    Hip abduction    Hip adduction    Hip internal rotation    Hip external rotation    Knee flexion 4 5  Knee extension 4 5  Ankle dorsiflexion 1 at best (in AFO, reports no movement in ankle)   Ankle plantarflexion    Ankle inversion    Ankle eversion     (Blank rows = not tested)    FUNCTIONAL TESTS:  5 times sit to stand: 12.8 seconds some off shift from R LE  Timed up and go (TUG): deferred  3 minute walk test: 21ft [facility]     11/28/23 0001  Dynamic Gait Index  Level Surface 2  Change in Gait Speed 2  Gait with  Horizontal Head Turns 2  Gait with Vertical Head Turns 2  Gait and Pivot Turn 2  Step Over Obstacle 2  Step Around Obstacles 2  Steps 1  Total Score 15     GAIT: Distance walked: 264ft  Assistive device utilized: Single point cane Level of assistance: Modified independence Comments: wide BOS, increased tone in R LE with gait pattern- limited knee ROM and ankle tends to invert a bit , mild unsteadiness with corners, limited rotation at trunk/hips                                                                                                                                 TREATMENT DATE:   12/11/23  Nustep L6x8 minutes all four extremities seat 7   One foot on BOSU/other on ground 3x30 seconds B PWR step laterally + reach to target at ground level on R x5 (standing) PWR step forward + mini squat to [facility] press to mini squat + backwards PWR step x5 Cross midline reach/wt shift to RLE + STS with multimodal cues to maintain wt on RLE with transitions x8  Gait and curb training outside- slopes and hills on even and uneven surfaces, occasional cues for sequencing with curb navigation. Difficulty with eccentric quad work on hemi side  when going down hill     12/05/23  Nustep L6 x8 minutes all four extremities seat 7  Hip hikes x10 B min-mod cues for form  Forward step ups 6 inch step x12 B intermittent UE support  Gait training 213ft 1.5# R ankle for improved proprioception, cues for heel toe pattern especially push off with toe when about to enter swing phase  Backwards walking with 1.5# cues for improved step length L LE to promote increased stance time hemi LE    3 way taps off blue foam pad x10 B, difficulty due to weak hip ABD groups hemi LE  Side steps + forward/backward steps over blue foam pad x2 laps            12/03/23  Nustep L6x8 minutes all four extremities for w/u, promotion of reciprocal movement  Forward step ups 6 inch step R LE intermittent UE support  for mm endurance and WB tone management   One foot on BOSU/other on solid surface 3x30 seconds B- cues to not lock out R knee Forward lunges onto BOSU x10 B- cues to not lock out R knee in stance, and to avoid R LE circumduction when lifting to top of BOSU Lateral lunges onto BOSU x10 B- cues to not lock out R knee in stance, and to avoid RLE circumduction with movement to top of ball  Gait with 1.5# R LE for improved proprioception x163ft x2- first lap with hurricane, second without. Noted increased LE circumduction without AD    11/28/23   Side step, step over forward and backward on blue foam pad in // bars  DGI re-test  Tandem walk with lateral toe tap each step, alternating blue foam pad // bars     STS with yellow ball in goblet hold x10, cues to avoid offshift from hemi side- placed R foot back/L foot forward for majority of reps  Nustep L6x8 minutes all four extremities for reciprocal movement and functional activity tolerance  Heel toe steps on blue air pad for assist with rocking motion x10 B, cues to normalize heel-toe and swing through, followed by gait training with focus on heel-toe pattern and keeping hips level            PATIENT EDUCATION:  Education details: exam findings, POC, HEP, otherwise as above  Person educated: Patient Education method: Explanation, Demonstration, and Handouts Education comprehension: verbalized understanding, returned demonstration, and needs further education  HOME EXERCISE PROGRAM:  Access Code: [email or web address] URL: [email or web address] Date: 11/20/2023 Prepared by: Terrel Ferries  Exercises - Heel Toe Raises with Counter Support  - 1 x daily - 7 x weekly - 2 sets - 10 reps - Stride Stance Weight Shift  - 1 x daily - 7 x weekly - 2 sets - 10 reps - Single Leg Balance with Clock Reach  - 1 x daily - 7 x weekly - 2 sets - 10 reps - Sit to Stand with Resistance Around Legs  - 1 x daily - 7 x weekly - 2 sets - 10 reps -  Forward Step Over with Counter Support  - 1 x daily - 7 x weekly - 2 sets - 10 reps - Backward Step Over with Counter Support  - 1 x daily - 7 x weekly - 2 sets - 10 reps   ASSESSMENT:  CLINICAL IMPRESSION:            Pt arrives today doing OK, we continued working on functional gait mechanics and coordination, also balance as able with goal of normalizing movement patterns. He is thinking about starting to drive next week, still encouraged him to get clearance from the MD on this first. Incorporated some PWR inspired moves to work on better wt shifting to and mm coordination on hemi side.    From eval: Patient is a 68 y.o. M who was seen today for physical therapy evaluation and  treatment for I61.0 (ICD-10-CM) - Nontraumatic subcortical hemorrhage of left cerebral hemisphere (HCC). He is very motivated and moving a very high level given the hemorrhagic CVA. His main concerns are balance/coordination and getting back to walking without assistive device. Anticipate he will do very well with skilled PT services.   OBJECTIVE IMPAIRMENTS: Abnormal gait, decreased activity tolerance, decreased balance, decreased coordination, decreased knowledge of use of DME, decreased mobility, difficulty walking, decreased strength, and decreased safety awareness.   ACTIVITY LIMITATIONS: standing, squatting, stairs, transfers, and locomotion level  PARTICIPATION LIMITATIONS: driving, shopping, community activity, and yard work  PERSONAL FACTORS: Age, Behavior pattern, Fitness, Past/current experiences, Social background, Time since onset of injury/illness/exacerbation, and Transportation are also affecting patient's functional outcome.   REHAB POTENTIAL: Good  CLINICAL DECISION MAKING: Evolving/moderate complexity  EVALUATION COMPLEXITY: Moderate   GOALS: Goals reviewed with patient? No  SHORT TERM GOALS: Target date: 11/28/2023   Will be compliant with appropriate progressive HEP  Baseline: Goal  status: MET 11/28/23  2.  Will score 14/24 on DGI  Baseline: 10 Goal status: MET 11/28/23 15/24  3.  Will be able to perform all functional transfers without compensation strategies or offshift from hemi LE  Baseline:  Goal status: ONGOING 11/28/23- progressing well but still present   4.  Will be able to ambulate household distances without AD, Mod(I) Baseline:  Goal status: ONGOING 11/28/23 still using cane 90% of the time     LONG TERM GOALS: Target date: 12/26/2023    MMT in R LE to be at least 4+/5 in hip and knee musculature  Baseline:  Goal status: INITIAL  2.  Will score 18/24 on DGI to show improved functional balance  Baseline:  Goal status: INITIAL  3.  Will be able to walk at least 284ft over uneven/unsteady surfaces without device and no more than S/ 581ft over even surfaces no device Mod(I) Baseline:  Goal status: INITIAL  4.  Will be able to ascend and descend steps without rail and minimal compensation strategies  Baseline:  Goal status: INITIAL  5.  Will be compliant with advanced HEP vs gym based program at DC to maintain functional gains  Baseline:  Goal status: INITIAL  6.  PSFS to improve by at least 3 points  Baseline: 3.7 Goal status: ONGOING 12/03/23 6   PLAN:  PT FREQUENCY: 2x/week  PT DURATION: 8 weeks  PLANNED INTERVENTIONS: [id number]- PT Re-evaluation, 97110-Therapeutic exercises, 97530- Therapeutic activity, 97112- Neuromuscular re-education, 97535- Self Care, 95621- Manual therapy, [id number]- Gait training, [phone]- Orthotic Fit/training, [phone]- Aquatic Therapy, Stair training, Taping, Dry Needling, Cryotherapy, and Moist heat  PLAN FOR NEXT SESSION: strength, balance, gait training, progressive functional conditioning. Motor control tasks/progressions for R LE. Keep working on foot clearance and normalizing gait pattern as able, did he benefit from PWR based interventions?   Terrel Ferries, PT, DPT 12/11/23 11:43 AM

## 2023-12-11 NOTE — Therapy (Signed)
 OUTPATIENT OCCUPATIONAL THERAPY NEURO TREATMENT  Patient Name: Adrian [name] MRN: [id number] DOB:22-Nov-1955, 68 y.o., male Today's Date: 12/11/2023  PCP: Dr. Darren Em REFERRING PROVIDER: Dr. Sharl Davies  END OF SESSION:  OT End of Session - 12/11/23 1019     Visit Number 11    Number of Visits 25    Date for OT Re-Evaluation 01/23/24    Authorization Type Medicare    Authorization - Visit Number 11    Progress Note Due on Visit 20    OT Start Time 1015    OT Stop Time 1055    OT Time Calculation (min) 40 min                    Past Medical History:  Diagnosis Date   AF (paroxysmal atrial fibrillation) (HCC) 11/29/2018   Atrial flutter (HCC) 10/12/2015   a. TEE 3/17 with ? LAA clot-->s/p TEE/DCCV 11/24/2015; s/p DCCV 12/2022;s/p ablation 06/2023   CAD (coronary artery disease), native coronary artery 12/06/2015   a. 10/2015 MV: EF  37%, reversible defect inferior apex, intermediate risk findings; b. 10/2015 Cath: 20% mid RCA.   Carotid artery stenosis    1-39% right ICA stenosis and occluded left ICA   Colonic diverticular abscess    Diverticulitis    History of chemotherapy 2005   Cisplatin   Hypothyroidism    NICM (nonischemic cardiomyopathy) (HCC) 10/14/2015   a. Tachy mediated?;  b. Echo 3/17 - Mild concentric LVH, EF 30-35%, anteroseptal, anterior, anterolateral, apical anterior, lateral hypokinesis, trivial MR, mild to moderately reduced RVSF; c. LHC 3/17 - mRCA 20%   Radiation NOv.3,2005-Dec. 15, 2005   6810 cGy in 30 fractions   Tonsil cancer [facility]) 2005   Dr Ava Lei.  XRT   Past Surgical History:  Procedure Laterality Date   A-FLUTTER ABLATION N/A 06/19/2023   Procedure: A-FLUTTER ABLATION;  Surgeon: Boyce Byes, MD;  Location: [facility] INVASIVE CV LAB;  Service: Cardiovascular;  Laterality: N/A;   APPENDECTOMY N/A 03/07/2019   Procedure: ROBOTIC ASSISTED APPENDECTOMY;  Surgeon: Candyce Champagne, MD;  Location: [facility] ORS;  Service: General;  Laterality: N/A;    CARDIAC CATHETERIZATION N/A 10/18/2015   Procedure: Left Heart Cath and Coronary Angiography;  Surgeon: Odie Benne, MD;  Location: [facility] INVASIVE CV LAB;  Service: Cardiovascular;  Laterality: N/A;   CARDIOVERSION N/A 11/24/2015   Procedure: CARDIOVERSION;  Surgeon: Loyde Rule, MD;  Location: [facility] ENDOSCOPY;  Service: Cardiovascular;  Laterality: N/A;   CARDIOVERSION N/A 12/20/2022   Procedure: CARDIOVERSION;  Surgeon: Bridgette Campus, MD;  Location: [facility] INVASIVE CV LAB;  Service: Cardiovascular;  Laterality: N/A;   CYSTOSCOPY WITH STENT PLACEMENT Bilateral 03/07/2019   Procedure: CYSTOSCOPY WITH BILATERAL FIREFLY INJECTION;  Surgeon: Andrez Banker, MD;  Location: [facility] ORS;  Service: Urology;  Laterality: Bilateral;   GASTROSTOMY TUBE PLACEMENT  07/04/2004   IR - G tube for tonsilar cancer   IR ANGIO INTRA EXTRACRAN SEL COM CAROTID INNOMINATE UNI R MOD SED  03/09/2019   IR ANGIO VERTEBRAL SEL VERTEBRAL UNI R MOD SED  03/09/2019   IR CT HEAD LTD  03/09/2019   IR PERCUTANEOUS ART THROMBECTOMY/INFUSION INTRACRANIAL INC DIAG ANGIO  03/09/2019   IR RADIOLOGIST EVAL & MGMT  12/18/2018   LAMINECTOMY     C5/placement of steel plate   LOOP RECORDER INSERTION N/A 06/19/2023   Procedure: LOOP RECORDER INSERTION;  Surgeon: Boyce Byes, MD;  Location: [facility] INVASIVE CV LAB;  Service: Cardiovascular;  Laterality: N/A;  NECK SURGERY  2003   replaced disk   RADIOLOGY WITH ANESTHESIA N/A 03/08/2019   Procedure: IR WITH ANESTHESIA;  Surgeon: Luellen Sages, MD;  Location: [facility] OR;  Service: Radiology;  Laterality: N/A;   TEE WITHOUT CARDIOVERSION N/A 10/15/2015   Procedure: TRANSESOPHAGEAL ECHOCARDIOGRAM (TEE);  Surgeon: Darlis Eisenmenger, MD;  Location: [facility] ENDOSCOPY;  Service: Cardiovascular;  Laterality: N/A;   TEE WITHOUT CARDIOVERSION N/A 11/24/2015   Procedure: TRANSESOPHAGEAL ECHOCARDIOGRAM (TEE);  Surgeon: Loyde Rule, MD;  Location: [facility] ENDOSCOPY;  Service: Cardiovascular;  Laterality: N/A;    TEE WITHOUT CARDIOVERSION N/A 12/20/2022   Procedure: TRANSESOPHAGEAL ECHOCARDIOGRAM;  Surgeon: Bridgette Campus, MD;  Location: [facility] INVASIVE CV LAB;  Service: Cardiovascular;  Laterality: N/A;   XI ROBOTIC ASSISTED COLOSTOMY TAKEDOWN N/A 03/07/2019   Procedure: XI ROBOTIC ASSISTED LOW ANTERIOR RESECTION, RIGID PROCTOSCOPY;  Surgeon: Candyce Champagne, MD;  Location: [facility] ORS;  Service: General;  Laterality: N/A;   Patient Active Problem List   Diagnosis Date Noted   Carotid occlusion, left 10/23/2023   ICH (intracerebral hemorrhage) (HCC) 08/11/2023   Carotid artery stenosis    Atrial flutter (HCC) 12/19/2022   Essential hypertension 04/22/2021   Hypokalemia 03/12/2019   Expressive aphasia 03/10/2019   Dysphagia 03/10/2019   Middle cerebral artery embolism, left 03/09/2019   Diverticular stricture (HCC) 03/07/2019   Bacteria in urine    Chronic atrial fibrillation (HCC) 12/02/2018   Current use of long term anticoagulation 12/02/2018   UTI (urinary tract infection) 11/29/2018   Hyponatremia 11/29/2018   PAF (paroxysmal atrial fibrillation) (HCC) 11/29/2018   Diverticulitis of large intestine with abscess 09/23/2018   Dyslipidemia 12/05/2017   CAD (coronary artery disease), native coronary artery 12/06/2015   SOB (shortness of breath)    NICM (nonischemic cardiomyopathy) (HCC)    Thyroid  activity decreased    Hypothyroidism 08/12/2013   History of radiation therapy 05/30/2012   Smokes tobacco daily 05/30/2012   Cancer of tonsillar fossa (HCC) 11/27/2011   Tonsil cancer (HCC) 2005   History of chemotherapy 2005    ONSET DATE: 08/11/23  REFERRING DIAG: I61.0 (ICD-10-CM) - Nontraumatic subcortical hemorrhage of left cerebral hemisphere (HCC)   THERAPY DIAG:  Muscle weakness (generalized)  Other abnormalities of gait and mobility  Other lack of coordination  Frontal lobe and executive function deficit  Attention and concentration deficit  Unsteadiness on feet  Rationale for  Evaluation and Treatment: Rehabilitation  SUBJECTIVE:   SUBJECTIVE STATEMENT: Pt reports increased ease holding a coffee cup with RUE  Pt accompanied by: self  PERTINENT HISTORY:   68 yo male hospitalized 08/11/23 with R sided weakness. CT showed a large basal ganglia ICH with IVH extension. PMH includes:  A-fib, carotid artery stenosis, CAD, tonsil cancer, hypothyroidism, L MCA CVA.    PRECAUTIONS: Fall  WEIGHT BEARING RESTRICTIONS: No  PAIN:  Are you having pain? No  FALLS: Has patient fallen in last 6 months? No  LIVING ENVIRONMENT: Lives with: lives alone has friends and ex wife checks in on him Lives in: House/apartment Stairs: no Has following equipment at home: Single point cane and Tour manager  PLOF: Independent  PATIENT GOALS: improve use of RUE  OBJECTIVE:  Note: Objective measures were completed at Evaluation unless otherwise noted.  HAND DOMINANCE: Right  ADLs: Overall ADLs: mod I with all basic ADLS Transfers/ambulation related to ADLs: Eating: uses RUE 20%, currently using non dominant LUE,  Grooming: using non dominant LUE UB Dressing: mod I difficulty with buttons LB Dressing: mod I Toileting:  mod I with cane Bathing: has tub bench Tub Shower transfers: mod I  Equipment: Transfer tub bench  IADLs: Shopping: needs assist for transportation Light housekeeping: Pt is performing light cleaning, he has occasional assist Meal Prep: microwave meals primarily, does a little stove top Medication management: pt handles  Financial management: pt handles Handwriting: 90% legibility, handwriting is small   MOBILITY STATUS:  mod I with cane  ACTIVITY TOLERANCE: Activity tolerance: standing  UPPER EXTREMITY ROM:  grossly WFLS  UPPER EXTREMITY MMT:     MMT Right eval Left eval  Shoulder flexion 3+/5 4+/5  Shoulder abduction    Shoulder adduction    Shoulder extension    Shoulder internal rotation    Shoulder external rotation    Middle  trapezius    Lower trapezius    Elbow flexion 4-/5 4-/5  Elbow extension 4-/5 4-/5  Wrist flexion    Wrist extension    Wrist ulnar deviation    Wrist radial deviation    Wrist pronation    Wrist supination    (Blank rows = not tested)  HAND FUNCTION: Grip strength: Right: 50 lbs; Left: 63 lbs  COORDINATION: 9 Hole Peg test: Right: 59.69 sec; Left: 29.35 sec  SENSATION:diminished in RUE  COGNITION: Overall cognitive status:unable to spell WORLD backwards correctly, recalls 2/3 words following a delay.  VISION ASSESSMENT: Not tested- denies visual changes   OBSERVATIONS: Pleasant gentleman motivated to improve                                                                                                                              TREATMENT DATE: 12/11/23-Seated closed chain shoulder flexion, then diagonals both directions, min v.c and facilitation for control. Quadraped activities on mat for corestability and UE strenght, cat and cow, then liftin alternate UE, then lifting alternate LE, ball added under abdomen for increased stability, min-mod facilitation and v.c 10 reps each Fine motor coordination task placing grooved pegs then removing with in hand manipulation, min difficulty/ v.c Gripper set at level 2 sustained grip to pick up 1 inch blocks with RUE, min v.c Standing to perfrom functional reaching to place graded clothespins, 1-8# on targets with RUE, while retrieving from lower surface, supervision and min v.c UBE x 6 mins level 3 for conditioning.   12/05/23-Fine motor coordination task to put various sized pegs into pegboard, min v.c to avoid compensatory shoulder hike, removing pegs with in hand manipulation min difficulty/ v.c, Pt with improved dexterity today. Reveiwed red theraband HEP, ( shoulder horizontal abduction, biceps curls, triceps extension)10-15 reps each, min v.c for positioning). Pt performed shoulder extension  10 reps bilateral UE's in standing with  red band however pt requires mod v.c for positioning, so this was not issued for home. Therapist checked short term goals for PN.  12/03/23 Standing to copy small peg design on vertical surface, mod difficulty/ drops and min v.c, increased time required, min errors for design, min v.c  for in hand manipulation Ambulating 125 ft while carrying plate in right hand while using cane in left min v.c no drops. Closed chain shoulder flexion, then digaonals each direction with left and right UE's, min v.c for positioning  11/28/23-UBE x 6 mins level 2 for conditioning, min v.c for speed and balanced use of UE's Mosaic picture design to pick up various shaped pieces and manipulate to place in frame while completing a design for increased fine motor coordination with a cognitve component, min difficulty, min v.c for in hand manipulation and to avoid compensatory patterns. Increased time required for this task. Education provided regarding memory compensations.  11/26/23- UBE x 6 mins level 3 for conditioning Copying small peg design with RUE for increased fine motor coordination with a cognitive component, min difficulty, increased time required, v.c  Reveiwed previously issued yellow theraband exercises, 10- 20 reps each, min v.c shoulder flexion with foam roll 15 reps to eye level with improved control Followed by shoulder flexion low to mid range with 1 lbs weight in each hand focus on control  11/21/23- Pt practiced placing screwing bolts into pegboard with RUE then using tools such as screw driver, Customer service manager and wrench to tighten items with RUE min-mod v.c, pt also practiced locking and unlocking a pad lock. Pt with mod difficulty using wrench, min difficulty for using screw driver. UBE x5 mins level 1 for conditioning  11/19/23 Closed chain shoulder flexion with foam roll 2 sets of 10 reps min v.c and facilitation for shoulder positioning and focus on control. Reviewed yellow theraband exercises 15  reps each bilateral UE's. Pt performed biceps  curls with bilateral UE's simultaneously with cues to have RUE mirror movements of LUE, min v.c Placing grooved pegs into pegboard with RUE for increased fine motor coordination, min difficulty/ v.c  Removing pegs with tweezers, for sustained pinch for 1/2 the items,  mod difficulty/ drops   11/15/23 UBE x 6 mins level 1 for conditioning closed chain shoulder flexion with medium ball x 15 reps then with foam roll for send set min v.c for control. Yellow theraband for biceps curls and triceps extension, 10-15 reps each UE, min v.c copying small peg design with RUE min-mod difficulty/ drops, min v.c to avoid compensation.  11/12/23- Reviewed cane HEP 10-15 reps min v.c Functional reaching to place and retrieve clothespins(1-8#) from overhead target(vertical yardstick) with RUE for functional reach and sustained pinch, min difficulty, v.c Placing metal pegs( 3 sizes) into pegboard with RUE then removing with in hand manipulation, min difficulty/ v.c  Flipping and dealing cards with RUE for increased fine motor coordination, min v.c   11/09/23: Pt instructed in cane HEP.  Pt fatigued quickly and with difficulty with control, but responded well to cueing.  Also reviewed and added to coordination HEP.  Pt able to copy shopping list with good legibility, but several spelling errors--pt reports difficulty with spelling if generating a list at home.   10/31/23- eval     PATIENT EDUCATION: Education details:see above  Education method: Explanation, Demonstration, Tactile cues, Verbal cues, Education comprehension: verbalized understanding, returned demonstration, and verbal cues required  HOME EXERCISE PROGRAM: beginning coordination 3/26 11/09/23  Cane HEP 4/10- biceps triceps with yellow band, shoulder flexion with paper towel roll  11/27/23- memory compensations GOALS: Goals reviewed with patient? Yes  SHORT TERM GOALS: Target date: 12/01/23  I with  inital HEP Goal status: met for cane, beginning theraband 12/05/23  2.  Pt will report feeding himself at least  50% of the time with RUE. Baseline: uses 20% x Goal status:  met, per pt report 78% 11/28/23  3.  Pt will demonstrate improved RUE fine motor coordination for ADLs as evidenced by decreasing 9 hole peg test score to 52 secs or less Goal status: met , 43.26 secs  4.  Pt will report consistently brushing his teeth with RUE Goal status: ongoing, brushes teeth with RUE 50% of the time 12/05/23  5.  I with memory compensation strategies Goal status: met, issued 11/28/23  6.  Pt will report that he has resumed moderate level home management mod I Goal status:  ongoing, pt has started  washes dishes and does laundry but not back to prior level. 12/05/23   LONG TERM GOALS: Target date: 01/23/24  I with updated HEP Goal status: INITIAL  2.  Pt will demonstrate improved RUE fine motor coordination for ADLs as evidenced by decreasing 9 hole peg test score to 48 secs or less Goal status: ongoing  3.  Pt will demonstrate ability to carry a plate in RUE without spills. Goal status: INITIAL  4.  Pt will write a short paragraph with 100% legibility and minimal decrease in letter size. Goal status: ongoing  5.  Pt will resume use of RUE as dominant hand at least 75% of the time for ADLs/ IADLs. Goal status: INITIAL  6. Pt will retrieve a 3 lbs object from eye level shelf using RUE with good control  Goal status: inital  ASSESSMENT:  CLINICAL IMPRESSION: Pt continues to progress towards goals with improving RUE strength and coordiantion. He now reports he has an easier time holding a coffee cup in RUE now due to improved control. Aaron AasPERFORMANCE DEFICITS: in functional skills including ADLs, IADLs, coordination, dexterity, sensation, strength, flexibility, Fine motor control, Gross motor control, mobility, balance, endurance, decreased knowledge of precautions, decreased knowledge of use of  DME, and UE functional use, cognitive skills including attention, memory, problem solving, safety awareness, and thought, and psychosocial skills including coping strategies, environmental adaptation, habits, interpersonal interactions, and routines and behaviors.   IMPAIRMENTS: are limiting patient from ADLs, IADLs, play, leisure, and social participation.   CO-MORBIDITIES: may have co-morbidities  that affects occupational performance. Patient will benefit from skilled OT to address above impairments and improve overall function.  MODIFICATION OR ASSISTANCE TO COMPLETE EVALUATION: No modification of tasks or assist necessary to complete an evaluation.  OT OCCUPATIONAL PROFILE AND HISTORY: Detailed assessment: Review of records and additional review of physical, cognitive, psychosocial history related to current functional performance.  CLINICAL DECISION MAKING: LOW - limited treatment options, no task modification necessary  REHAB POTENTIAL: Good  EVALUATION COMPLEXITY: Low    PLAN:  OT FREQUENCY: 2x/week  OT DURATION: 12 weeks  PLANNED INTERVENTIONS: 97168 OT Re-evaluation, 97535 self care/ADL training, 95621 therapeutic exercise, 97530 therapeutic activity, 97112 neuromuscular re-education, 97140 manual therapy, 97113 aquatic therapy, 97035 ultrasound, 97018 paraffin, 30865 moist heat, 97010 cryotherapy, 97034 contrast bath, 97014 electrical stimulation unattended, 97129 Cognitive training (first 15 min), 78469 Cognitive training(each additional 15 min), 62952 Orthotics management and training, 84132 Splinting (initial encounter), passive range of motion, balance training, functional mobility training, energy conservation, coping strategies training, patient/family education, and DME and/or AE instructions  RECOMMENDED OTHER SERVICES: n/a  CONSULTED AND AGREED WITH PLAN OF CARE: Patient  PLAN FOR NEXT SESSION:  RUE coordiantion, control, RUE functional use   Shulamit Donofrio,  OTR/L 12/11/2023, 10:21 AM

## 2023-12-12 ENCOUNTER — Ambulatory Visit: Admitting: Speech Pathology

## 2023-12-12 ENCOUNTER — Encounter: Payer: Self-pay | Admitting: Physical Therapy

## 2023-12-12 ENCOUNTER — Encounter: Payer: Self-pay | Admitting: Speech Pathology

## 2023-12-12 ENCOUNTER — Ambulatory Visit: Admitting: Occupational Therapy

## 2023-12-12 ENCOUNTER — Ambulatory Visit: Admitting: Physical Therapy

## 2023-12-12 ENCOUNTER — Encounter: Payer: Self-pay | Admitting: Occupational Therapy

## 2023-12-12 DIAGNOSIS — R41841 Cognitive communication deficit: Secondary | ICD-10-CM

## 2023-12-12 DIAGNOSIS — R262 Difficulty in walking, not elsewhere classified: Secondary | ICD-10-CM

## 2023-12-12 DIAGNOSIS — M6281 Muscle weakness (generalized): Secondary | ICD-10-CM

## 2023-12-12 DIAGNOSIS — R4701 Aphasia: Secondary | ICD-10-CM

## 2023-12-12 DIAGNOSIS — R278 Other lack of coordination: Secondary | ICD-10-CM

## 2023-12-12 DIAGNOSIS — R2689 Other abnormalities of gait and mobility: Secondary | ICD-10-CM

## 2023-12-12 NOTE — Therapy (Unsigned)
 OUTPATIENT SPEECH LANGUAGE PATHOLOGY TREATMENT   Patient Name: Adrian [name] MRN: [id number] DOB:Jul 12, 1956, 68 y.o., male Today's Date: 12/12/2023  PCP: NA REFERRING PROVIDER: Genetta Kenning, MD  Speech Therapy Progress Note  Dates of Reporting Period: 11/07/23 to present  Subjective Statement: Pt feels frustrated with language impairment  Objective: See tx notes  Goal Update: See tx notes; slow progress towards goals.   Plan: Continue with current POC  Reason Skilled Services are Required: To maximize functional communication and increase confidence  END OF SESSION:  End of Session - 12/12/23 1102     Visit Number 11    Number of Visits 17    Date for SLP Re-Evaluation 01/07/24    SLP Start Time 1100    SLP Stop Time  1140    SLP Time Calculation (min) 40 min    Activity Tolerance Patient tolerated treatment well             Past Medical History:  Diagnosis Date   AF (paroxysmal atrial fibrillation) (HCC) 11/29/2018   Atrial flutter (HCC) 10/12/2015   a. TEE 3/17 with ? LAA clot-->s/p TEE/DCCV 11/24/2015; s/p DCCV 12/2022;s/p ablation 06/2023   CAD (coronary artery disease), native coronary artery 12/06/2015   a. 10/2015 MV: EF  37%, reversible defect inferior apex, intermediate risk findings; b. 10/2015 Cath: 20% mid RCA.   Carotid artery stenosis    1-39% right ICA stenosis and occluded left ICA   Colonic diverticular abscess    Diverticulitis    History of chemotherapy 2005   Cisplatin   Hypothyroidism    NICM (nonischemic cardiomyopathy) (HCC) 10/14/2015   a. Tachy mediated?;  b. Echo 3/17 - Mild concentric LVH, EF 30-35%, anteroseptal, anterior, anterolateral, apical anterior, lateral hypokinesis, trivial MR, mild to moderately reduced RVSF; c. LHC 3/17 - mRCA 20%   Radiation NOv.3,2005-Dec. 15, 2005   6810 cGy in 30 fractions   Tonsil cancer [facility]) 2005   Dr Ava Lei.  XRT   Past Surgical History:  Procedure Laterality Date   A-FLUTTER ABLATION  N/A 06/19/2023   Procedure: A-FLUTTER ABLATION;  Surgeon: Boyce Byes, MD;  Location: [facility] INVASIVE CV LAB;  Service: Cardiovascular;  Laterality: N/A;   APPENDECTOMY N/A 03/07/2019   Procedure: ROBOTIC ASSISTED APPENDECTOMY;  Surgeon: Candyce Champagne, MD;  Location: [facility] ORS;  Service: General;  Laterality: N/A;   CARDIAC CATHETERIZATION N/A 10/18/2015   Procedure: Left Heart Cath and Coronary Angiography;  Surgeon: Odie Benne, MD;  Location: [facility] INVASIVE CV LAB;  Service: Cardiovascular;  Laterality: N/A;   CARDIOVERSION N/A 11/24/2015   Procedure: CARDIOVERSION;  Surgeon: Loyde Rule, MD;  Location: [facility] ENDOSCOPY;  Service: Cardiovascular;  Laterality: N/A;   CARDIOVERSION N/A 12/20/2022   Procedure: CARDIOVERSION;  Surgeon: Bridgette Campus, MD;  Location: [facility] INVASIVE CV LAB;  Service: Cardiovascular;  Laterality: N/A;   CYSTOSCOPY WITH STENT PLACEMENT Bilateral 03/07/2019   Procedure: CYSTOSCOPY WITH BILATERAL FIREFLY INJECTION;  Surgeon: Andrez Banker, MD;  Location: [facility] ORS;  Service: Urology;  Laterality: Bilateral;   GASTROSTOMY TUBE PLACEMENT  07/04/2004   IR - G tube for tonsilar cancer   IR ANGIO INTRA EXTRACRAN SEL COM CAROTID INNOMINATE UNI R MOD SED  03/09/2019   IR ANGIO VERTEBRAL SEL VERTEBRAL UNI R MOD SED  03/09/2019   IR CT HEAD LTD  03/09/2019   IR PERCUTANEOUS ART THROMBECTOMY/INFUSION INTRACRANIAL INC DIAG ANGIO  03/09/2019   IR RADIOLOGIST EVAL & MGMT  12/18/2018   LAMINECTOMY  C5/placement of steel plate   LOOP RECORDER INSERTION N/A 06/19/2023   Procedure: LOOP RECORDER INSERTION;  Surgeon: Boyce Byes, MD;  Location: [facility] INVASIVE CV LAB;  Service: Cardiovascular;  Laterality: N/A;   NECK SURGERY  2003   replaced disk   RADIOLOGY WITH ANESTHESIA N/A 03/08/2019   Procedure: IR WITH ANESTHESIA;  Surgeon: Luellen Sages, MD;  Location: [facility] OR;  Service: Radiology;  Laterality: N/A;   TEE WITHOUT CARDIOVERSION N/A 10/15/2015   Procedure: TRANSESOPHAGEAL  ECHOCARDIOGRAM (TEE);  Surgeon: Darlis Eisenmenger, MD;  Location: [facility] ENDOSCOPY;  Service: Cardiovascular;  Laterality: N/A;   TEE WITHOUT CARDIOVERSION N/A 11/24/2015   Procedure: TRANSESOPHAGEAL ECHOCARDIOGRAM (TEE);  Surgeon: Loyde Rule, MD;  Location: [facility] ENDOSCOPY;  Service: Cardiovascular;  Laterality: N/A;   TEE WITHOUT CARDIOVERSION N/A 12/20/2022   Procedure: TRANSESOPHAGEAL ECHOCARDIOGRAM;  Surgeon: Bridgette Campus, MD;  Location: [facility] INVASIVE CV LAB;  Service: Cardiovascular;  Laterality: N/A;   XI ROBOTIC ASSISTED COLOSTOMY TAKEDOWN N/A 03/07/2019   Procedure: XI ROBOTIC ASSISTED LOW ANTERIOR RESECTION, RIGID PROCTOSCOPY;  Surgeon: Candyce Champagne, MD;  Location: [facility] ORS;  Service: General;  Laterality: N/A;   Patient Active Problem List   Diagnosis Date Noted   Carotid occlusion, left 10/23/2023   ICH (intracerebral hemorrhage) (HCC) 08/11/2023   Carotid artery stenosis    Atrial flutter (HCC) 12/19/2022   Essential hypertension 04/22/2021   Hypokalemia 03/12/2019   Expressive aphasia 03/10/2019   Dysphagia 03/10/2019   Middle cerebral artery embolism, left 03/09/2019   Diverticular stricture (HCC) 03/07/2019   Bacteria in urine    Chronic atrial fibrillation (HCC) 12/02/2018   Current use of long term anticoagulation 12/02/2018   UTI (urinary tract infection) 11/29/2018   Hyponatremia 11/29/2018   PAF (paroxysmal atrial fibrillation) (HCC) 11/29/2018   Diverticulitis of large intestine with abscess 09/23/2018   Dyslipidemia 12/05/2017   CAD (coronary artery disease), native coronary artery 12/06/2015   SOB (shortness of breath)    NICM (nonischemic cardiomyopathy) (HCC)    Thyroid  activity decreased    Hypothyroidism 08/12/2013   History of radiation therapy 05/30/2012   Smokes tobacco daily 05/30/2012   Cancer of tonsillar fossa (HCC) 11/27/2011   Tonsil cancer (HCC) 2005   History of chemotherapy 2005    ONSET DATE: Referred on 10/26/23 (CVA in Jan)   REFERRING DIAG:  I61.0 (ICD-10-CM) - Nontraumatic subcortical hemorrhage of left cerebral hemisphere [facility])   THERAPY DIAG:  Cognitive communication deficit  Aphasia  Rationale for Evaluation and Treatment: Rehabilitation  SUBJECTIVE:   SUBJECTIVE STATEMENT: Pt has been trying word finding strategies at home.   Pt accompanied by: self  PERTINENT HISTORY: Per chart review: 68 y.o. male with history of atrial fibrillation, on Eliquis , carotid artery stenosis, hypothyroidism , CVA 1/25  PAIN:  Are you having pain? No  FALLS: Has patient fallen in last 6 months?  No  LIVING ENVIRONMENT: Lives with: lives alone Lives in: House/apartment Ex Wife will be driving to appointments (or friends) Sheryle Donning  PLOF:  Level of assistance: Independent with ADLs, Independent with IADLs Employment: Retired; [employer]   PATIENT GOALS: memory, word finding, speech   OBJECTIVE:  Note: Objective measures were completed at Evaluation unless otherwise noted.  DIAGNOSTIC FINDINGS: Per Chart Review  CT HEAD CODE STROKE WO CONTRAST 08/11/23  IMPRESSION: 1.8 x 2.0 x 1.1 cm parenchymal hemorrhage centered in the left basal ganglia with intraventricular extension. No evidence of hydrocephalus at the time of exam. No significant midline shift.   Findings were  paged to Dr. Murvin Arthurs on 08/11/23 at 2:29 PM     Electronically Signed   By: Clora Dane M.D.   On: 08/11/2023 14:31  COGNITION: Overall cognitive status: Impaired Areas of impairment:  Attention: Impaired: Selective, Alternating, Divided Memory: Impaired: Short term Prospective Executive function: Impaired: Problem solving, Organization, Planning, and Slow processing Functional deficits: Reports difficulty with recall of important information  AUDITORY COMPREHENSION: Overall auditory comprehension: Appears intact YES/NO questions: Appears intact Following directions: Appears intact Conversation: Complex Interfering components: attention and processing  speed Effective technique: extra processing time, pausing, and repetition/stressing words  READING COMPREHENSION: Intact  EXPRESSION: verbal  VERBAL EXPRESSION: Level of generative/spontaneous verbalization: conversation  Comments: Anomia  Non-verbal means of communication: N/A  WRITTEN EXPRESSION: Dominant hand: right Written expression: Not tested  MOTOR SPEECH: Overall motor speech: impaired Level of impairment: Conversation Respiration:  WFL Phonation: normal Resonance: WFL Articulation: Appears intact; pt reports slurred speech when feeling tired Intelligibility: Intelligible; 100% intelligible at eval; reports worsening speech when fatigued Motor planning: Appears intact Motor speech errors:  NA Interfering components:  NA Effective technique: slow rate and over articulate  ORAL MOTOR EXAMINATION: Overall status: Impaired:   Labial: Right (Symmetry) Facial: Right (Symmetry) *ROM is good. At rest, facial droop on R side. Comments: NA   STANDARDIZED ASSESSMENTS: Initiated CLQT - to complete next session    Cognitive Linguistic Quick Test: AGE - 18 - 69   The Cognitive Linguistic Quick Test (CLQT) was administered to assess the relative status of five cognitive domains: attention, memory, language, executive functioning, and visuospatial skills. Scores from 10 tasks were used to estimate severity ratings (standardized for age groups 18-69 years and 70-89 years) for each domain, a clock drawing task, as well as an overall composite severity rating of cognition.       Task Score Criterion Cut Scores  Personal Facts 7/8 8  Symbol Cancellation 11/12 11  Confrontation Naming 10/10 10  Clock Drawing  11/13 12  Story Retelling 6/10 6  Symbol Trails 10/10 9  Generative Naming 3/9 5  Design Memory 6/6 5  Mazes  8/8 7  Design Generation 5/13 6     Cognitive Domain Composite Score Severity Rating  Attention 190/215 WNL  Memory 148/185 Mild  Executive Function  26/40 WNL  Language 26/37 Mild  Visuospatial Skills 95/105 WNL  Clock Drawing  11/13 Mild  Composite Severity Rating  WNL           PATIENT REPORTED OUTCOME MEASURES (PROM): Neuro QOL - Cognitive Short Form: 29                                                                                                                             TREATMENT DATE:   12/12/23: Pt was seen for skilled ST services targeting word finding strategies. Pt participated in   12/11/23: Pt was seen for skilled ST services targeting cognitive-communication. Pt reports he has been attempting word finding strategies at  home - but feels like he should challenge himself to do it without using strategies. SLP explained the purpose of strategies is to improve ability to communicate thoughts/word more efficiently. Pt reports he gets frustrated that he is not able to do it as quickly as he would like. SLP completed education on attention strategies - pt reports "I am not really doing anything at home or around a bunch of people", and does not feel like he could benefit from these. To continue with word finding practice next session.   12/05/23: Pt was seen for skilled ST services targeting cognitive-communication. Pt reports he has not been trying to use word finding strategies at home. SLP to continue practice of word finding strategies to encourage transfer of strategies to home/community. SLP initiated education on attention strategies this session re: managing fatigue - spoon theory, managing distractions, brain breaks. To cont with strategy education next session.    12/03/23: Pt was seen for skilled ST services. Pt reports speech intelligibility has been good, but still struggling when feeling more tired. He reports he has been mindful of dysarthria strategies during these times re: pausing and increasing loudness. Pt feels word finding is the most difficult for him at this time and would like to focus on this. He reports he  also has trouble understanding the TV. SLP suggested putting subtitles on the TV to see if this is helpful. Pt reported he had an instance where he could not think of the name "iris". Pt did not use a strategy in this instance. SLP suggested trying "look it up" and demonstrated how to complete this on google. SLP facilitated use of "look it up" strategy with patient using iphone. Pt was able to generate adequate descriptions to find missing words for [employer] Day, his favorite pizza place. Pt participated in informal conversation with no instances of anomia.   11/28/23: Pt was seen for skilled ST services targeting cognitive-communication. SLP edu on using microphone speech-to-text to make notes to support memory. Pt reports he did not do any writing since he was last here - but he has been thinking about it. SLP included writing in today's therapeutic task re: creation of dysarthria BE CLEAR sentences. Pt was able to generate and produce sentences with intelligible speech independently. Pt's speech continues to be 100% intelligible; however, pt reports this is not the case when he is fatigued. He understands this is when he should be most cognizant of using dysarthria strategies. Pt was tasked with writing 5, SLP-dictated, functional sentences. Pt required modA to spell words correctly. Pt was demonstrate use of speech to text to assist in error correction of sentences.   11/26/23: Pt was seen for skilled ST services targeting cognitive-communication. Pt reports he used "synonym" word finding strategy in an instance of anomia over the week. Pt reports he hasn't had any other significant difficulty with word finding. We reviewed "Be Clear" strategies for dysarthria re: speaking louder, over-articulating, slow down. SLP checked-in on pt's feelings regarding cognitive difficulties. He reports he hasn't noticed any significant problems; however, he realizes he has forgotten to bring his folder in x2. SLP suggested  writing down tasks. Pt reported difficulty with writing from his previous strokes. SLP suggested using speech to text. Pt reported he has been leaving his cell phone at home. SLP encouraged him to always keep his cell nearby in case of emergencies, as well as, a memory tool. Pt attempted to write list of tasks needed to complete. Spelling errors noted. SLP  encouraged pt to spell the word in his head prior to writing it down vs dividing attention between trying to figure out how to spell the word while attempting to write. Pt required max cue reminders to first spell the word in his head before attempting to write. Pt was noted to reduce errors by ~50% using therapist strategy.   11/20/23: Pt was seen for skilled ST services targeting cognitive-communication and word finding strategies. Pt completed Cognitive PROMs measure - see above in blue. Pt reports he hasn't had any significant instances of anomia - given a 2-3 second delay, word typically pops out. Pt reports he is to go golfing (on the putting green) with a friend for his birthday. We discussed word finding strategies he could use in conversation. SLP facilitated word finding practice using multiple meaning words. Pt was tasked with finding the synonym (or a phrase close) for the given word. SLP challenged the patient to find synonyms to different golf terms in preparation for his outing. SLP briefly educated on "Be Clear" for dysarthria. To continue next session.   11/15/23: Pt was seen for skilled ST services targeting cognitive-communication and word finding strategies. SLP initiated education on word finding. SLP provided examples of each strategies and how to use in conversation. Pt reported he feels he would like to the following word finding strategies re: delay, describe, association, look it up. Pt was noted to have errors when reading aloud which was impacting his reading comprehension. Pt was noted to delete sounds from words  and add functor  words while reading. SLP trialed re: pointing to each word with pen, re-reading aloud, re-reading silently to remove expressive component. To demonstrate "describe" strategy through Semantic Feature Analysis (SFA) next session.   11/09/23: Pt was seen for skilled ST services targeting completed cognitive-communication testing and education. SLP reviewed CLQT results with patient. He had no further questions. SLP provided pt with cognitive-communication handout. Pt reported difficulty with re: recalling details in conversations, understanding TV shows, difficulty with expressing thoughts, and reading/writing.  Pt has residual aphasia from previous stroke. To provide strategies in this POC.   11/07/23: Provided brief education on cognitive-communication disorder and speech intelligibility.   PATIENT EDUCATION: Education details: SLP role in cognitive-communication impairments Person educated: Patient Education method: Explanation Education comprehension: needs further education   GOALS: Goals reviewed with patient? No; to update after further evaluation  SHORT TERM GOALS: Target date: 12/08/23  Continue Cognitive testing to determine needs Baseline: Goal status: MET  2.  Complete PROMs measure Baseline:  Goal status: MET  3.  Pt will recall 3 word finding strategies to use in instances of anomia with independently. Baseline:  Goal status: INITIAL  4.  Pt will verbalize 2 attention strategies to improve concentration during functional tasks.  Baseline:  Goal status: INITIAL  5. Pt will verbalize 2 memory strategies for recall of important information.  Baseline:  Goal status: INITIAL    LONG TERM GOALS: Target date: 01/08/24  Pt will improve score on PROMs measure Baseline:  Goal status: INITIAL  2.  Pt will report successful use of word finding strategies at home and in the community. Baseline:  Goal status: INITIAL  3.  Pt will report successful use of attention strategies at  home and in the community. Baseline:  Goal status: INITIAL  4.  Pt will report successful use of memory strategies at home and in the community.  Baseline:  Goal status: INITIAL   ASSESSMENT:  CLINICAL IMPRESSION: Pt is  a 68 yo male who presents to ST OP for evaluation post recent CVA in Jan. Pt lives alone and will be driven to appointments by ex-wife or friends. Pt endorses speech changes, in addition to sx associated with cognitive-communication challenges [employer] of new information, word finding, and slowed processing.  Pt was assessed using CLQT - to complete next session. SLP observed difficulty with short term memory, instances of anomia, and very mild dysarthria. Pt was 100% intelligible in quiet, therapy room to an unfamiliar listener. He reports speech is worse when feeling tired. SLP to provide strategies for pt use in moments where speech may be unclear. Pt reports no difficulty with swallowing at this time and is tolerating a regular/thin diet. SLP rec skilled ST services to address cognitive-communication impairment and dysarthria to maximize functional independence and increase QOL.     OBJECTIVE IMPAIRMENTS: include memory, executive functioning, expressive language, and dysarthria. These impairments are limiting patient from effectively communicating at home and in community. Factors affecting potential to achieve goals and functional outcome are  NA . Patient will benefit from skilled SLP services to address above impairments and improve overall function.  REHAB POTENTIAL: Good  PLAN:  SLP FREQUENCY: 1-2x/week  SLP DURATION: 8 weeks  PLANNED INTERVENTIONS: Environmental controls, Cueing hierachy, Internal/external aids, Functional tasks, SLP instruction and feedback, Compensatory strategies, Patient/family education, and 52841 Treatment of speech (30 or 45 min)     [employer], CCC-SLP 12/12/2023, 11:02 AM

## 2023-12-12 NOTE — Therapy (Addendum)
 OUTPATIENT OCCUPATIONAL THERAPY NEURO TREATMENT  Patient Name: Adrian [name] MRN: [id number] DOB:12-Oct-1955, 68 y.o., male Today's Date: 12/12/2023  PCP: Dr. Darren Em REFERRING PROVIDER: Dr. Sharl Davies  END OF SESSION: visit 12/25  Time in: 10:15 time out 11:00           Past Medical History:  Diagnosis Date   AF (paroxysmal atrial fibrillation) (HCC) 11/29/2018   Atrial flutter (HCC) 10/12/2015   a. TEE 3/17 with ? LAA clot-->s/p TEE/DCCV 11/24/2015; s/p DCCV 12/2022;s/p ablation 06/2023   CAD (coronary artery disease), native coronary artery 12/06/2015   a. 10/2015 MV: EF  37%, reversible defect inferior apex, intermediate risk findings; b. 10/2015 Cath: 20% mid RCA.   Carotid artery stenosis    1-39% right ICA stenosis and occluded left ICA   Colonic diverticular abscess    Diverticulitis    History of chemotherapy 2005   Cisplatin   Hypothyroidism    NICM (nonischemic cardiomyopathy) (HCC) 10/14/2015   a. Tachy mediated?;  b. Echo 3/17 - Mild concentric LVH, EF 30-35%, anteroseptal, anterior, anterolateral, apical anterior, lateral hypokinesis, trivial MR, mild to moderately reduced RVSF; c. LHC 3/17 - mRCA 20%   Radiation NOv.3,2005-Dec. 15, 2005   6810 cGy in 30 fractions   Tonsil cancer [facility]) 2005   Dr Ava Lei.  XRT   Past Surgical History:  Procedure Laterality Date   A-FLUTTER ABLATION N/A 06/19/2023   Procedure: A-FLUTTER ABLATION;  Surgeon: Boyce Byes, MD;  Location: [facility] INVASIVE CV LAB;  Service: Cardiovascular;  Laterality: N/A;   APPENDECTOMY N/A 03/07/2019   Procedure: ROBOTIC ASSISTED APPENDECTOMY;  Surgeon: Candyce Champagne, MD;  Location: [facility] ORS;  Service: General;  Laterality: N/A;   CARDIAC CATHETERIZATION N/A 10/18/2015   Procedure: Left Heart Cath and Coronary Angiography;  Surgeon: Odie Benne, MD;  Location: [facility] INVASIVE CV LAB;  Service: Cardiovascular;  Laterality: N/A;   CARDIOVERSION N/A 11/24/2015   Procedure: CARDIOVERSION;   Surgeon: Loyde Rule, MD;  Location: [facility] ENDOSCOPY;  Service: Cardiovascular;  Laterality: N/A;   CARDIOVERSION N/A 12/20/2022   Procedure: CARDIOVERSION;  Surgeon: Bridgette Campus, MD;  Location: [facility] INVASIVE CV LAB;  Service: Cardiovascular;  Laterality: N/A;   CYSTOSCOPY WITH STENT PLACEMENT Bilateral 03/07/2019   Procedure: CYSTOSCOPY WITH BILATERAL FIREFLY INJECTION;  Surgeon: Andrez Banker, MD;  Location: [facility] ORS;  Service: Urology;  Laterality: Bilateral;   GASTROSTOMY TUBE PLACEMENT  07/04/2004   IR - G tube for tonsilar cancer   IR ANGIO INTRA EXTRACRAN SEL COM CAROTID INNOMINATE UNI R MOD SED  03/09/2019   IR ANGIO VERTEBRAL SEL VERTEBRAL UNI R MOD SED  03/09/2019   IR CT HEAD LTD  03/09/2019   IR PERCUTANEOUS ART THROMBECTOMY/INFUSION INTRACRANIAL INC DIAG ANGIO  03/09/2019   IR RADIOLOGIST EVAL & MGMT  12/18/2018   LAMINECTOMY     C5/placement of steel plate   LOOP RECORDER INSERTION N/A 06/19/2023   Procedure: LOOP RECORDER INSERTION;  Surgeon: Boyce Byes, MD;  Location: [facility] INVASIVE CV LAB;  Service: Cardiovascular;  Laterality: N/A;   NECK SURGERY  2003   replaced disk   RADIOLOGY WITH ANESTHESIA N/A 03/08/2019   Procedure: IR WITH ANESTHESIA;  Surgeon: Luellen Sages, MD;  Location: [facility] OR;  Service: Radiology;  Laterality: N/A;   TEE WITHOUT CARDIOVERSION N/A 10/15/2015   Procedure: TRANSESOPHAGEAL ECHOCARDIOGRAM (TEE);  Surgeon: Darlis Eisenmenger, MD;  Location: [facility] ENDOSCOPY;  Service: Cardiovascular;  Laterality: N/A;   TEE WITHOUT CARDIOVERSION N/A 11/24/2015   Procedure: TRANSESOPHAGEAL  ECHOCARDIOGRAM (TEE);  Surgeon: Loyde Rule, MD;  Location: [facility] ENDOSCOPY;  Service: Cardiovascular;  Laterality: N/A;   TEE WITHOUT CARDIOVERSION N/A 12/20/2022   Procedure: TRANSESOPHAGEAL ECHOCARDIOGRAM;  Surgeon: Bridgette Campus, MD;  Location: [facility] INVASIVE CV LAB;  Service: Cardiovascular;  Laterality: N/A;   XI ROBOTIC ASSISTED COLOSTOMY TAKEDOWN N/A 03/07/2019   Procedure: XI ROBOTIC  ASSISTED LOW ANTERIOR RESECTION, RIGID PROCTOSCOPY;  Surgeon: Candyce Champagne, MD;  Location: [facility] ORS;  Service: General;  Laterality: N/A;   Patient Active Problem List   Diagnosis Date Noted   Carotid occlusion, left 10/23/2023   ICH (intracerebral hemorrhage) (HCC) 08/11/2023   Carotid artery stenosis    Atrial flutter (HCC) 12/19/2022   Essential hypertension 04/22/2021   Hypokalemia 03/12/2019   Expressive aphasia 03/10/2019   Dysphagia 03/10/2019   Middle cerebral artery embolism, left 03/09/2019   Diverticular stricture (HCC) 03/07/2019   Bacteria in urine    Chronic atrial fibrillation (HCC) 12/02/2018   Current use of long term anticoagulation 12/02/2018   UTI (urinary tract infection) 11/29/2018   Hyponatremia 11/29/2018   PAF (paroxysmal atrial fibrillation) (HCC) 11/29/2018   Diverticulitis of large intestine with abscess 09/23/2018   Dyslipidemia 12/05/2017   CAD (coronary artery disease), native coronary artery 12/06/2015   SOB (shortness of breath)    NICM (nonischemic cardiomyopathy) (HCC)    Thyroid  activity decreased    Hypothyroidism 08/12/2013   History of radiation therapy 05/30/2012   Smokes tobacco daily 05/30/2012   Cancer of tonsillar fossa (HCC) 11/27/2011   Tonsil cancer (HCC) 2005   History of chemotherapy 2005    ONSET DATE: 08/11/23  REFERRING DIAG: I61.0 (ICD-10-CM) - Nontraumatic subcortical hemorrhage of left cerebral hemisphere (HCC)   THERAPY DIAG:  Muscle weakness (generalized)  Other abnormalities of gait and mobility  Other lack of coordination  Difficulty in walking, not elsewhere classified  Rationale for Evaluation and Treatment: Rehabilitation  SUBJECTIVE:   SUBJECTIVE STATEMENT: Pt reports eating primarily with RUE  Pt accompanied by: self  PERTINENT HISTORY:   68 yo male hospitalized 08/11/23 with R sided weakness. CT showed a large basal ganglia ICH with IVH extension. PMH includes:  A-fib, carotid artery stenosis, CAD,  tonsil cancer, hypothyroidism, L MCA CVA.    PRECAUTIONS: Fall  WEIGHT BEARING RESTRICTIONS: No  PAIN:  Are you having pain? No  FALLS: Has patient fallen in last 6 months? No  LIVING ENVIRONMENT: Lives with: lives alone has friends and ex wife checks in on him Lives in: House/apartment Stairs: no Has following equipment at home: Single point cane and Tour manager  PLOF: Independent  PATIENT GOALS: improve use of RUE  OBJECTIVE:  Note: Objective measures were completed at Evaluation unless otherwise noted.  HAND DOMINANCE: Right  ADLs: Overall ADLs: mod I with all basic ADLS Transfers/ambulation related to ADLs: Eating: uses RUE 20%, currently using non dominant LUE,  Grooming: using non dominant LUE UB Dressing: mod I difficulty with buttons LB Dressing: mod I Toileting: mod I with cane Bathing: has tub bench Tub Shower transfers: mod I  Equipment: Transfer tub bench  IADLs: Shopping: needs assist for transportation Light housekeeping: Pt is performing light cleaning, he has occasional assist Meal Prep: microwave meals primarily, does a little stove top Medication management: pt handles  Financial management: pt handles Handwriting: 90% legibility, handwriting is small   MOBILITY STATUS: mod I with cane  ACTIVITY TOLERANCE: Activity tolerance: standing  UPPER EXTREMITY ROM:  grossly WFLS  UPPER EXTREMITY MMT:  MMT Right eval Left eval  Shoulder flexion 3+/5 4+/5  Shoulder abduction    Shoulder adduction    Shoulder extension    Shoulder internal rotation    Shoulder external rotation    Middle trapezius    Lower trapezius    Elbow flexion 4-/5 4-/5  Elbow extension 4-/5 4-/5  Wrist flexion    Wrist extension    Wrist ulnar deviation    Wrist radial deviation    Wrist pronation    Wrist supination    (Blank rows = not tested)  HAND FUNCTION: Grip strength: Right: 50 lbs; Left: 63 lbs  COORDINATION: 9 Hole Peg test: Right: 59.69 sec;  Left: 29.35 sec  SENSATION:diminished in RUE  COGNITION: Overall cognitive status:unable to spell WORLD backwards correctly, recalls 2/3 words following a delay.  VISION ASSESSMENT: Not tested- denies visual changes   OBSERVATIONS: Pleasant gentleman motivated to improve                                                                                                                              TREATMENT DATE: 12/12/23 Seated closed chain shoulder flexion, holding ball betwen palms then diagonals both directions, min v.c and facilitation for control. Red theraband exercises shoulder horizontal abduction, biceps curls, and ticeps extension, bilateral UE's for increased strength 10 reps each, min v.c  Dynamic functional reaching in standing to retrieve items from lower surface and place in low and middle shelves, supervision, min v.c, no LOB. Stacking blocks/ unstacking blocks with LUE for increased fine motor coordination and RUE control, min difficulty, v.c  Gripper set at level 3 to pick up 1/3 of blocks with RUE, min difficulty/ v.c Writing activities with foam grip initally, printing alphabet with v.c to print in capitals to meet line, mi v.c Pt with improved legibility and letter size following practice, with min v.c Pt required min assist for spelling. End of session pt was able to complete a sentence using regular pen with good legibility and letter size.  12/11/23-Seated closed chain shoulder flexion, then diagonals both directions, min v.c and facilitation for control. Quadraped activities on mat for core stability and UE strength, cat and cow, then liftin alternate UE, then lifting alternate LE, ball added under abdomen for increased stability, min-mod facilitation and v.c 10 reps each Fine motor coordination task placing grooved pegs then removing with in hand manipulation, min difficulty/ v.c Gripper set at level 2 sustained grip to pick up 1 inch blocks with RUE, min v.c Standing  to perfrom functional reaching to place graded clothespins, 1-8# on targets with RUE, while retrieving from lower surface, supervision and min v.c UBE x 6 mins level 3 for conditioning.   12/05/23-Fine motor coordination task to put various sized pegs into pegboard, min v.c to avoid compensatory shoulder hike, removing pegs with in hand manipulation min difficulty/ v.c, Pt with improved dexterity today. Reveiwed red theraband HEP, ( shoulder horizontal abduction, biceps curls, triceps extension)10-15 reps  each, min v.c for positioning). Pt performed shoulder extension  10 reps bilateral UE's in standing with red band however pt requires mod v.c for positioning, so this was not issued for home. Therapist checked short term goals for PN.  12/03/23 Standing to copy small peg design on vertical surface, mod difficulty/ drops and min v.c, increased time required, min errors for design, min v.c for in hand manipulation Ambulating 125 ft while carrying plate in right hand while using cane in left min v.c no drops. Closed chain shoulder flexion, then digaonals each direction with left and right UE's, min v.c for positioning  11/28/23-UBE x 6 mins level 2 for conditioning, min v.c for speed and balanced use of UE's Mosaic picture design to pick up various shaped pieces and manipulate to place in frame while completing a design for increased fine motor coordination with a cognitve component, min difficulty, min v.c for in hand manipulation and to avoid compensatory patterns. Increased time required for this task. Education provided regarding memory compensations.  11/26/23- UBE x 6 mins level 3 for conditioning Copying small peg design with RUE for increased fine motor coordination with a cognitive component, min difficulty, increased time required, v.c  Reveiwed previously issued yellow theraband exercises, 10- 20 reps each, min v.c shoulder flexion with foam roll 15 reps to eye level with improved  control Followed by shoulder flexion low to mid range with 1 lbs weight in each hand focus on control  11/21/23- Pt practiced placing screwing bolts into pegboard with RUE then using tools such as screw driver, Customer service manager and wrench to tighten items with RUE min-mod v.c, pt also practiced locking and unlocking a pad lock. Pt with mod difficulty using wrench, min difficulty for using screw driver. UBE x5 mins level 1 for conditioning  11/19/23 Closed chain shoulder flexion with foam roll 2 sets of 10 reps min v.c and facilitation for shoulder positioning and focus on control. Reviewed yellow theraband exercises 15 reps each bilateral UE's. Pt performed biceps  curls with bilateral UE's simultaneously with cues to have RUE mirror movements of LUE, min v.c Placing grooved pegs into pegboard with RUE for increased fine motor coordination, min difficulty/ v.c  Removing pegs with tweezers, for sustained pinch for 1/2 the items,  mod difficulty/ drops   11/15/23 UBE x 6 mins level 1 for conditioning closed chain shoulder flexion with medium ball x 15 reps then with foam roll for send set min v.c for control. Yellow theraband for biceps curls and triceps extension, 10-15 reps each UE, min v.c copying small peg design with RUE min-mod difficulty/ drops, min v.c to avoid compensation.  11/12/23- Reviewed cane HEP 10-15 reps min v.c Functional reaching to place and retrieve clothespins(1-8#) from overhead target(vertical yardstick) with RUE for functional reach and sustained pinch, min difficulty, v.c Placing metal pegs( 3 sizes) into pegboard with RUE then removing with in hand manipulation, min difficulty/ v.c  Flipping and dealing cards with RUE for increased fine motor coordination, min v.c   11/09/23: Pt instructed in cane HEP.  Pt fatigued quickly and with difficulty with control, but responded well to cueing.  Also reviewed and added to coordination HEP.  Pt able to copy shopping list with good  legibility, but several spelling errors--pt reports difficulty with spelling if generating a list at home.   10/31/23- eval     PATIENT EDUCATION: Education details:see above  Education method: Explanation, Demonstration, Tactile cues, Verbal cues, Education comprehension: verbalized understanding, returned demonstration, and  verbal cues required  HOME EXERCISE PROGRAM: beginning coordination 3/26 11/09/23  Cane HEP 4/10- biceps triceps with yellow band, shoulder flexion with paper towel roll  11/27/23- memory compensations GOALS: Goals reviewed with patient? Yes  SHORT TERM GOALS: Target date: 12/01/23  I with inital HEP Goal status: met for cane, beginning theraband 12/05/23  2.  Pt will report feeding himself at least 50% of the time with RUE. Baseline: uses 20% x Goal status:  met, per pt report 78% 11/28/23  3.  Pt will demonstrate improved RUE fine motor coordination for ADLs as evidenced by decreasing 9 hole peg test score to 52 secs or less Goal status: met , 43.26 secs  4.  Pt will report consistently brushing his teeth with RUE Goal status: ongoing, brushes teeth with RUE 50% of the time 12/05/23  5.  I with memory compensation strategies Goal status: met, issued 11/28/23  6.  Pt will report that he has resumed moderate level home management mod I Goal status:  ongoing, pt has started washing dishes and does laundry but not back to prior level. 12/05/23   LONG TERM GOALS: Target date: 01/23/24  I with updated HEP Goal status: ongoing, 12/12/23  2.  Pt will demonstrate improved RUE fine motor coordination for ADLs as evidenced by decreasing 9 hole peg test score to 48 secs or less Goal status: ongoing, 12/12/23  3.  Pt will demonstrate ability to carry a plate in RUE without spills. Goal status: ongoing, 12/12/23  4.  Pt will write a short paragraph with 100% legibility and minimal decrease in letter size. Goal status: ongoing, Pt demonstrates improved legibility/ letter  size with practice, pt will benefit from reinforcement of strategies. 12/12/23  5.  Pt will resume use of RUE as dominant hand at least 75% of the time for ADLs/ IADLs. Goal status: ongoing, uses 50% of the time 12/12/23  6. Pt will retrieve a 3 lbs object from eye level shelf using RUE with good control  Goal status:ongoing 12/12/23  ASSESSMENT:  CLINICAL IMPRESSION: Pt is progressing towards goals with improving RUE strength and control. He now reports eating with RUE 90% of the time.He demonstrates ability to write a sentence with good legibility and letter size following practice. Aaron AasPERFORMANCE DEFICITS: in functional skills including ADLs, IADLs, coordination, dexterity, sensation, strength, flexibility, Fine motor control, Gross motor control, mobility, balance, endurance, decreased knowledge of precautions, decreased knowledge of use of DME, and UE functional use, cognitive skills including attention, memory, problem solving, safety awareness, and thought, and psychosocial skills including coping strategies, environmental adaptation, habits, interpersonal interactions, and routines and behaviors.   IMPAIRMENTS: are limiting patient from ADLs, IADLs, play, leisure, and social participation.   CO-MORBIDITIES: may have co-morbidities  that affects occupational performance. Patient will benefit from skilled OT to address above impairments and improve overall function.  MODIFICATION OR ASSISTANCE TO COMPLETE EVALUATION: No modification of tasks or assist necessary to complete an evaluation.  OT OCCUPATIONAL PROFILE AND HISTORY: Detailed assessment: Review of records and additional review of physical, cognitive, psychosocial history related to current functional performance.  CLINICAL DECISION MAKING: LOW - limited treatment options, no task modification necessary  REHAB POTENTIAL: Good  EVALUATION COMPLEXITY: Low    PLAN:  OT FREQUENCY: 2x/week  OT DURATION: 12 weeks  PLANNED  INTERVENTIONS: 97168 OT Re-evaluation, 97535 self care/ADL training, 60454 therapeutic exercise, 97530 therapeutic activity, 97112 neuromuscular re-education, 97140 manual therapy, 97113 aquatic therapy, 97035 ultrasound, 97018 paraffin, 09811 moist heat, 97010 cryotherapy, 91478  contrast bath, 97014 electrical stimulation unattended, 16109 Cognitive training (first 15 min), 97130 Cognitive training(each additional 15 min), 97760 Orthotics management and training, [phone] Splinting (initial encounter), passive range of motion, balance training, functional mobility training, energy conservation, coping strategies training, patient/family education, and DME and/or AE instructions  RECOMMENDED OTHER SERVICES: n/a  CONSULTED AND AGREED WITH PLAN OF CARE: Patient  PLAN FOR NEXT SESSION:  fine motor coordination, work towards long term goals.   Bandy Honaker, OTR/L 12/12/2023, 10:13 AM

## 2023-12-12 NOTE — Therapy (Signed)
 OUTPATIENT PHYSICAL THERAPY TREATMENT    Patient Name: Adrian [name] MRN: [id number] DOB:07/15/1956, 68 y.o., male Today's Date: 12/12/2023      END OF SESSION:  PT End of Session - 12/12/23 0951     Visit Number 13    Number of Visits 17    Date for PT Re-Evaluation 12/26/23    Authorization Type MCR    Authorization Time Period 10/31/23 to 12/26/23    Progress Note Due on Visit 20    PT Start Time 0932    PT Stop Time 1010    PT Time Calculation (min) 38 min    Activity Tolerance Patient tolerated treatment well    Behavior During Therapy [facility] for tasks assessed/performed                         Past Medical History:  Diagnosis Date   AF (paroxysmal atrial fibrillation) (HCC) 11/29/2018   Atrial flutter (HCC) 10/12/2015   a. TEE 3/17 with ? LAA clot-->s/p TEE/DCCV 11/24/2015; s/p DCCV 12/2022;s/p ablation 06/2023   CAD (coronary artery disease), native coronary artery 12/06/2015   a. 10/2015 MV: EF  37%, reversible defect inferior apex, intermediate risk findings; b. 10/2015 Cath: 20% mid RCA.   Carotid artery stenosis    1-39% right ICA stenosis and occluded left ICA   Colonic diverticular abscess    Diverticulitis    History of chemotherapy 2005   Cisplatin   Hypothyroidism    NICM (nonischemic cardiomyopathy) (HCC) 10/14/2015   a. Tachy mediated?;  b. Echo 3/17 - Mild concentric LVH, EF 30-35%, anteroseptal, anterior, anterolateral, apical anterior, lateral hypokinesis, trivial MR, mild to moderately reduced RVSF; c. LHC 3/17 - mRCA 20%   Radiation NOv.3,2005-Dec. 15, 2005   6810 cGy in 30 fractions   Tonsil cancer [facility]) 2005   Dr Ava Lei.  XRT   Past Surgical History:  Procedure Laterality Date   A-FLUTTER ABLATION N/A 06/19/2023   Procedure: A-FLUTTER ABLATION;  Surgeon: Boyce Byes, MD;  Location: [facility] INVASIVE CV LAB;  Service: Cardiovascular;  Laterality: N/A;   APPENDECTOMY N/A 03/07/2019   Procedure: ROBOTIC ASSISTED APPENDECTOMY;   Surgeon: Candyce Champagne, MD;  Location: [facility] ORS;  Service: General;  Laterality: N/A;   CARDIAC CATHETERIZATION N/A 10/18/2015   Procedure: Left Heart Cath and Coronary Angiography;  Surgeon: Odie Benne, MD;  Location: [facility] INVASIVE CV LAB;  Service: Cardiovascular;  Laterality: N/A;   CARDIOVERSION N/A 11/24/2015   Procedure: CARDIOVERSION;  Surgeon: Loyde Rule, MD;  Location: [facility] ENDOSCOPY;  Service: Cardiovascular;  Laterality: N/A;   CARDIOVERSION N/A 12/20/2022   Procedure: CARDIOVERSION;  Surgeon: Bridgette Campus, MD;  Location: [facility] INVASIVE CV LAB;  Service: Cardiovascular;  Laterality: N/A;   CYSTOSCOPY WITH STENT PLACEMENT Bilateral 03/07/2019   Procedure: CYSTOSCOPY WITH BILATERAL FIREFLY INJECTION;  Surgeon: Andrez Banker, MD;  Location: [facility] ORS;  Service: Urology;  Laterality: Bilateral;   GASTROSTOMY TUBE PLACEMENT  07/04/2004   IR - G tube for tonsilar cancer   IR ANGIO INTRA EXTRACRAN SEL COM CAROTID INNOMINATE UNI R MOD SED  03/09/2019   IR ANGIO VERTEBRAL SEL VERTEBRAL UNI R MOD SED  03/09/2019   IR CT HEAD LTD  03/09/2019   IR PERCUTANEOUS ART THROMBECTOMY/INFUSION INTRACRANIAL INC DIAG ANGIO  03/09/2019   IR RADIOLOGIST EVAL & MGMT  12/18/2018   LAMINECTOMY     C5/placement of steel plate   LOOP RECORDER INSERTION N/A 06/19/2023   Procedure: LOOP  RECORDER INSERTION;  Surgeon: Boyce Byes, MD;  Location: [facility] INVASIVE CV LAB;  Service: Cardiovascular;  Laterality: N/A;   NECK SURGERY  2003   replaced disk   RADIOLOGY WITH ANESTHESIA N/A 03/08/2019   Procedure: IR WITH ANESTHESIA;  Surgeon: Luellen Sages, MD;  Location: [facility] OR;  Service: Radiology;  Laterality: N/A;   TEE WITHOUT CARDIOVERSION N/A 10/15/2015   Procedure: TRANSESOPHAGEAL ECHOCARDIOGRAM (TEE);  Surgeon: Darlis Eisenmenger, MD;  Location: [facility] ENDOSCOPY;  Service: Cardiovascular;  Laterality: N/A;   TEE WITHOUT CARDIOVERSION N/A 11/24/2015   Procedure: TRANSESOPHAGEAL ECHOCARDIOGRAM (TEE);  Surgeon: Loyde Rule, MD;  Location: [facility] ENDOSCOPY;  Service: Cardiovascular;  Laterality: N/A;   TEE WITHOUT CARDIOVERSION N/A 12/20/2022   Procedure: TRANSESOPHAGEAL ECHOCARDIOGRAM;  Surgeon: Bridgette Campus, MD;  Location: [facility] INVASIVE CV LAB;  Service: Cardiovascular;  Laterality: N/A;   XI ROBOTIC ASSISTED COLOSTOMY TAKEDOWN N/A 03/07/2019   Procedure: XI ROBOTIC ASSISTED LOW ANTERIOR RESECTION, RIGID PROCTOSCOPY;  Surgeon: Candyce Champagne, MD;  Location: [facility] ORS;  Service: General;  Laterality: N/A;   Patient Active Problem List   Diagnosis Date Noted   Carotid occlusion, left 10/23/2023   ICH (intracerebral hemorrhage) (HCC) 08/11/2023   Carotid artery stenosis    Atrial flutter (HCC) 12/19/2022   Essential hypertension 04/22/2021   Hypokalemia 03/12/2019   Expressive aphasia 03/10/2019   Dysphagia 03/10/2019   Middle cerebral artery embolism, left 03/09/2019   Diverticular stricture (HCC) 03/07/2019   Bacteria in urine    Chronic atrial fibrillation (HCC) 12/02/2018   Current use of long term anticoagulation 12/02/2018   UTI (urinary tract infection) 11/29/2018   Hyponatremia 11/29/2018   PAF (paroxysmal atrial fibrillation) (HCC) 11/29/2018   Diverticulitis of large intestine with abscess 09/23/2018   Dyslipidemia 12/05/2017   CAD (coronary artery disease), native coronary artery 12/06/2015   SOB (shortness of breath)    NICM (nonischemic cardiomyopathy) (HCC)    Thyroid  activity decreased    Hypothyroidism 08/12/2013   History of radiation therapy 05/30/2012   Smokes tobacco daily 05/30/2012   Cancer of tonsillar fossa (HCC) 11/27/2011   Tonsil cancer (HCC) 2005   History of chemotherapy 2005    PCP: Glean Lamy MD   REFERRING PROVIDER: Genetta Kenning, MD  REFERRING DIAG: I61.0 (ICD-10-CM) - Nontraumatic subcortical hemorrhage of left cerebral hemisphere [facility])  THERAPY DIAG:  Muscle weakness (generalized)  Other abnormalities of gait and mobility  Other lack of  coordination  Difficulty in walking, not elsewhere classified  Rationale for Evaluation and Treatment: Rehabilitation  ONSET DATE: 08/11/23  SUBJECTIVE:   SUBJECTIVE STATEMENT:  Doing OK, nothing new. Energy is fine today.   PERTINENT HISTORY: 1.  History of left basal ganglia hemorrhage approximately 2 and half months ago.  He has completed inpatient rehabilitation as well as home health.  I do think he would be a great candidate for outpatient therapy.  He has gotten back to modified independent level but still has some mild weakness on the right side as well as fine motor issues and motor control issues.  His balance is also off.  He continues wear right AFO because of foot and ankle weakness.  Patient has some short-term memory issues as well Will make referral for outpatient PT OT and speech therapy Physical medicine rehab follow-up in 3 months  PAIN:  Are you having pain? No  0/10   PRECAUTIONS: Fall and Other: R hemi, has loop recorder   RED FLAGS: None   WEIGHT BEARING RESTRICTIONS: No  FALLS:  Has patient fallen in last 6 months? No  LIVING ENVIRONMENT: Lives with: lives alone Lives in: House/apartment Stairs: 2 STE with rail  Has following equipment at home: Single point cane and Environmental consultant - 2 wheeled  OCCUPATION: retired- used to work for [employer]   PLOF: Independent, Independent with basic ADLs, Independent with gait, Independent with transfers, and Requires assistive device for independence  PATIENT GOALS: walk straight without a cane, get back to driving and motorcycling   NEXT MD VISIT: Referring 01/25/24  OBJECTIVE:  Note: Objective measures were completed at Evaluation unless otherwise noted.  DIAGNOSTIC FINDINGS:   CLINICAL DATA:  Stroke, follow up Neuro deficit, acute, stroke suspected   EXAM: MRI HEAD WITHOUT AND WITH CONTRAST   TECHNIQUE: Multiplanar, multiecho pulse sequences of the brain and surrounding structures were obtained without and with  intravenous contrast.   CONTRAST:  6.5mL GADAVIST  GADOBUTROL  1 MMOL/ML IV SOLN   COMPARISON:  CT head from today.   FINDINGS: Brain: When comparing across modalities, no substantial change in intraparenchymal hemorrhage in the left basal ganglia with intraventricular extension. No visible surrounding acute hemorrhage or mass lesion; however, acute blood products limits assessment. Prior left frontal infarct with encephalomalacia. No hydrocephalus. No pathologic enhancement.   Vascular: Major arterial flow voids are maintained at the skull base.   Skull and upper cervical spine: Normal marrow signal.   Sinuses/Orbits: Right frontal sinus opacification. Remaining sinuses are clear. No acute orbital findings.   Other: No mastoid effusions.   IMPRESSION: 1. When comparing across modalities, no substantial change in intraparenchymal hemorrhage in the left basal ganglia with intraventricular extension. No progressive mass effect. 2. No visible surrounding acute hemorrhage or mass lesion; however, acute blood products limits assessment  EXAM: CT HEAD WITHOUT CONTRAST   TECHNIQUE: Contiguous axial images were obtained from the base of the skull through the vertex without intravenous contrast.   RADIATION DOSE REDUCTION: This exam was performed according to the departmental dose-optimization program which includes automated exposure control, adjustment of the mA and/or kV according to patient size and/or use of iterative reconstruction technique.   COMPARISON:  Head CT 08/12/2023   FINDINGS: Brain: Interval decrease in size of the previously seen hemorrhage in the left basal ganglia, now measuring 10 x 7 mm, previously 20 x 12 mm. There is resolution of intraventricular blood products. No hydrocephalus. No extra-axial fluid collection. No mass effect. No mass lesion. No CT evidence of an acute cortical infarct. There is chronic left MCA territory infarct involving the left  opercular region.   Vascular: No hyperdense vessel or unexpected calcification.   Skull: Normal. Negative for fracture or focal lesion.   Sinuses/Orbits: No middle ear or mastoid effusion. Paranasal sinuses are notable for complete opacification of the right frontal sinus. Orbits are unremarkable.   Other: None.   IMPRESSION: No acute intracranial abnormality. Interval decrease in size of left basal ganglia hemorrhage, now measuring 10 x 7 mm, previously 20 x 12 mm. Resolution of intraventricular blood products. No hydrocephalus.    PATIENT SURVEYS:    Patient-Specific Activity Scoring Scheme  "0" represents "unable to perform." "10" represents "able to perform at prior level. 0 1 2 3 4 5 6 7 8 9  10 (Date and Score)   Activity Eval  12/03/23   1. Walking straight without the cane  4   6  2. Strength in RLE   4 6   3. Coordinated movements of R LE  3 6  4.  5.    Score 3.7 6   Total score = sum of the activity scores/number of activities Minimum detectable change (90%CI) for average score = 2 points Minimum detectable change (90%CI) for single activity score = 3 points     COGNITION: Overall cognitive status: Within functional limits for tasks assessed     SENSATION: Not tested      LOWER EXTREMITY MMT:  MMT Right eval Left eval  Hip flexion 4 5  Hip extension    Hip abduction    Hip adduction    Hip internal rotation    Hip external rotation    Knee flexion 4 5  Knee extension 4 5  Ankle dorsiflexion 1 at best (in AFO, reports no movement in ankle)   Ankle plantarflexion    Ankle inversion    Ankle eversion     (Blank rows = not tested)    FUNCTIONAL TESTS:  5 times sit to stand: 12.8 seconds some off shift from R LE  Timed up and go (TUG): deferred  3 minute walk test: 264ft [facility]     11/28/23 0001  Dynamic Gait Index  Level Surface 2  Change in Gait Speed 2  Gait with Horizontal Head Turns 2  Gait with Vertical Head Turns 2   Gait and Pivot Turn 2  Step Over Obstacle 2  Step Around Obstacles 2  Steps 1  Total Score 15     GAIT: Distance walked: 248ft  Assistive device utilized: Single point cane Level of assistance: Modified independence Comments: wide BOS, increased tone in R LE with gait pattern- limited knee ROM and ankle tends to invert a bit , mild unsteadiness with corners, limited rotation at trunk/hips                                                                                                                                 TREATMENT DATE:   12/12/23  Side step over blue air pad (R) + forward step over mini air pads + side step over blue air pad (L) + side step over blue air pad (R) + forward step over mini air pads + side step over blue air pad (R) x3 rounds  Lateral toe taps alternating to small air targets + forward step up then step down on 4 inch box + alternating toe taps on bosu + step up then step down 4 inch box- difficulty maintaining good alignment with step down (1 round)   Nustep L6x8 minutes all four extremities  Gait outside over curbs and hills  with hurricane, general S and occasional cues for sequencing         12/11/23  Nustep L6x8 minutes all four extremities seat 7   One foot on BOSU/other on ground 3x30 seconds B PWR step laterally + reach to target at ground level on R x5 (standing) PWR step forward + mini squat to [facility] press to mini squat +  backwards PWR step x5 Cross midline reach/wt shift to RLE + STS with multimodal cues to maintain wt on RLE with transitions x8  Gait and curb training outside- slopes and hills on even and uneven surfaces, occasional cues for sequencing with curb navigation. Difficulty with eccentric quad work on hemi side  when going down hill     12/05/23  Nustep L6 x8 minutes all four extremities seat 7  Hip hikes x10 B min-mod cues for form  Forward step ups 6 inch step x12 B intermittent UE support  Gait training 221ft 1.5# R  ankle for improved proprioception, cues for heel toe pattern especially push off with toe when about to enter swing phase  Backwards walking with 1.5# cues for improved step length L LE to promote increased stance time hemi LE    3 way taps off blue foam pad x10 B, difficulty due to weak hip ABD groups hemi LE  Side steps + forward/backward steps over blue foam pad x2 laps            12/03/23  Nustep L6x8 minutes all four extremities for w/u, promotion of reciprocal movement  Forward step ups 6 inch step R LE intermittent UE support for mm endurance and WB tone management   One foot on BOSU/other on solid surface 3x30 seconds B- cues to not lock out R knee Forward lunges onto BOSU x10 B- cues to not lock out R knee in stance, and to avoid R LE circumduction when lifting to top of BOSU Lateral lunges onto BOSU x10 B- cues to not lock out R knee in stance, and to avoid RLE circumduction with movement to top of ball  Gait with 1.5# R LE for improved proprioception x17ft x2- first lap with hurricane, second without. Noted increased LE circumduction without AD    11/28/23   Side step, step over forward and backward on blue foam pad in // bars  DGI re-test  Tandem walk with lateral toe tap each step, alternating blue foam pad // bars     STS with yellow ball in goblet hold x10, cues to avoid offshift from hemi side- placed R foot back/L foot forward for majority of reps  Nustep L6x8 minutes all four extremities for reciprocal movement and functional activity tolerance  Heel toe steps on blue air pad for assist with rocking motion x10 B, cues to normalize heel-toe and swing through, followed by gait training with focus on heel-toe pattern and keeping hips level            PATIENT EDUCATION:  Education details: exam findings, POC, HEP, otherwise as above  Person educated: Patient Education method: Explanation, Demonstration, and Handouts Education comprehension:  verbalized understanding, returned demonstration, and needs further education  HOME EXERCISE PROGRAM:  Access Code: [email or web address] URL: [email or web address] Date: 11/20/2023 Prepared by: Terrel Ferries  Exercises - Heel Toe Raises with Counter Support  - 1 x daily - 7 x weekly - 2 sets - 10 reps - Stride Stance Weight Shift  - 1 x daily - 7 x weekly - 2 sets - 10 reps - Single Leg Balance with Clock Reach  - 1 x daily - 7 x weekly - 2 sets - 10 reps - Sit to Stand with Resistance Around Legs  - 1 x daily - 7 x weekly - 2 sets - 10 reps - Forward Step Over with Counter Support  - 1 x daily - 7 x weekly - 2 sets - 10 reps - Backward  Step Over with Counter Support  - 1 x daily - 7 x weekly - 2 sets - 10 reps   ASSESSMENT:  CLINICAL IMPRESSION:            Pt arrives today doing OK, we continued working on advanced functional balance and coordination tasks and attempting to normalize movement patterns. Continued hill work to promote improved quad eccentric control and activation. Doing well overall.   From eval: Patient is a 68 y.o. M who was seen today for physical therapy evaluation and treatment for I61.0 (ICD-10-CM) - Nontraumatic subcortical hemorrhage of left cerebral hemisphere [facility]). He is very motivated and moving a very high level given the hemorrhagic CVA. His main concerns are balance/coordination and getting back to walking without assistive device. Anticipate he will do very well with skilled PT services.   OBJECTIVE IMPAIRMENTS: Abnormal gait, decreased activity tolerance, decreased balance, decreased coordination, decreased knowledge of use of DME, decreased mobility, difficulty walking, decreased strength, and decreased safety awareness.   ACTIVITY LIMITATIONS: standing, squatting, stairs, transfers, and locomotion level  PARTICIPATION LIMITATIONS: driving, shopping, community activity, and yard work  PERSONAL FACTORS: Age, Behavior pattern, Fitness, Past/current  experiences, Social background, Time since onset of injury/illness/exacerbation, and Transportation are also affecting patient's functional outcome.   REHAB POTENTIAL: Good  CLINICAL DECISION MAKING: Evolving/moderate complexity  EVALUATION COMPLEXITY: Moderate   GOALS: Goals reviewed with patient? No  SHORT TERM GOALS: Target date: 11/28/2023   Will be compliant with appropriate progressive HEP  Baseline: Goal status: MET 11/28/23  2.  Will score 14/24 on DGI  Baseline: 10 Goal status: MET 11/28/23 15/24  3.  Will be able to perform all functional transfers without compensation strategies or offshift from hemi LE  Baseline:  Goal status: ONGOING 11/28/23- progressing well but still present   4.  Will be able to ambulate household distances without AD, Mod(I) Baseline:  Goal status: ONGOING 11/28/23 still using cane 90% of the time     LONG TERM GOALS: Target date: 12/26/2023    MMT in R LE to be at least 4+/5 in hip and knee musculature  Baseline:  Goal status: INITIAL  2.  Will score 18/24 on DGI to show improved functional balance  Baseline:  Goal status: INITIAL  3.  Will be able to walk at least 210ft over uneven/unsteady surfaces without device and no more than S/ 575ft over even surfaces no device Mod(I) Baseline:  Goal status: INITIAL  4.  Will be able to ascend and descend steps without rail and minimal compensation strategies  Baseline:  Goal status: INITIAL  5.  Will be compliant with advanced HEP vs gym based program at DC to maintain functional gains  Baseline:  Goal status: INITIAL  6.  PSFS to improve by at least 3 points  Baseline: 3.7 Goal status: ONGOING 12/03/23 6   PLAN:  PT FREQUENCY: 2x/week  PT DURATION: 8 weeks  PLANNED INTERVENTIONS: 97164- PT Re-evaluation, 97110-Therapeutic exercises, 97530- Therapeutic activity, 97112- Neuromuscular re-education, 97535- Self Care, 16109- Manual therapy, [id number]- Gait training, [phone]- Orthotic  Fit/training, [phone]- Aquatic Therapy, Stair training, Taping, Dry Needling, Cryotherapy, and Moist heat  PLAN FOR NEXT SESSION: strength, balance, gait training, progressive functional conditioning. Motor control tasks/progressions for R LE. Keep working on foot clearance and normalizing gait pattern as able, PWR work, Airline pilot   Terrel Ferries, PT, DPT 12/12/23 10:15 AM

## 2023-12-13 ENCOUNTER — Ambulatory Visit (INDEPENDENT_AMBULATORY_CARE_PROVIDER_SITE_OTHER): Payer: BC Managed Care – PPO

## 2023-12-13 DIAGNOSIS — I428 Other cardiomyopathies: Secondary | ICD-10-CM | POA: Diagnosis not present

## 2023-12-17 LAB — CUP PACEART REMOTE DEVICE CHECK
Date Time Interrogation Session: 20250509083900
Implantable Pulse Generator Implant Date: 20241112
Pulse Gen Serial Number: 126890

## 2023-12-17 NOTE — Addendum Note (Signed)
 Addended by: Lott Rouleau A on: 12/17/2023 02:46 PM   Modules accepted: Orders

## 2023-12-17 NOTE — Progress Notes (Signed)
 Carelink Summary Report / Loop Recorder

## 2023-12-20 ENCOUNTER — Ambulatory Visit: Admitting: Speech Pathology

## 2023-12-20 ENCOUNTER — Encounter: Payer: Self-pay | Admitting: Speech Pathology

## 2023-12-20 ENCOUNTER — Encounter: Payer: Self-pay | Admitting: Occupational Therapy

## 2023-12-20 ENCOUNTER — Ambulatory Visit: Admitting: Physical Therapy

## 2023-12-20 ENCOUNTER — Ambulatory Visit: Admitting: Occupational Therapy

## 2023-12-20 ENCOUNTER — Encounter: Payer: Self-pay | Admitting: Physical Therapy

## 2023-12-20 DIAGNOSIS — R2689 Other abnormalities of gait and mobility: Secondary | ICD-10-CM

## 2023-12-20 DIAGNOSIS — R278 Other lack of coordination: Secondary | ICD-10-CM

## 2023-12-20 DIAGNOSIS — R2681 Unsteadiness on feet: Secondary | ICD-10-CM

## 2023-12-20 DIAGNOSIS — M6281 Muscle weakness (generalized): Secondary | ICD-10-CM | POA: Diagnosis not present

## 2023-12-20 DIAGNOSIS — R41841 Cognitive communication deficit: Secondary | ICD-10-CM

## 2023-12-20 DIAGNOSIS — R262 Difficulty in walking, not elsewhere classified: Secondary | ICD-10-CM

## 2023-12-20 DIAGNOSIS — R4701 Aphasia: Secondary | ICD-10-CM

## 2023-12-20 DIAGNOSIS — R41844 Frontal lobe and executive function deficit: Secondary | ICD-10-CM

## 2023-12-20 DIAGNOSIS — R41840 Attention and concentration deficit: Secondary | ICD-10-CM

## 2023-12-20 NOTE — Patient Instructions (Signed)
Local Driver Evaluation Programs:  Comprehensive Evaluation: includes clinical and in vehicle behind the wheel testing by OCCUPATIONAL THERAPIST. Programs have varying levels of adaptive controls available for trial.   [employer], [place] [address] [place], [place]  [address] [phone] or [phone] [email or web address] Evaluator:  Richelle Ito, OT/CDRS/CDI/SCDCM/Low [facility] [address] [place], [place] [address] [phone] [email or web address] Evaluators:  Bertram Savin, OT and Mertie Clause, OT  Clinical evaluations only:  Includes clinical testing, refers to other programs or local certified driving instructor for behind the wheel testing.  [name] (outpatient Rehab) [place] [address] [place], [place] [address] [phone] for scheduling [email or web address] Evaluators:  Valentino Hue, OT; Haynes Hoehn, OT  Other area clinical evaluators available upon request including [facility], [place] and [facility].       Resource List What is a Warden/ranger: Your Road Ahead - A Guide to [employer] Evaluations http://www.thehartford.com/resources/mature-market-excellence/publications-on-aging  Association for Musician - Disability and Driving Fact Sheets http://www.aded.net/?page=510  Driving after a Brain Injury: Brain Injury Association of [employer] [place].be?A=SearchResult&SearchID=9495675&ObjectID=2758842&ObjectType=35  Driving with Adaptive Equipment: Landscape architect Association [email or web address]

## 2023-12-20 NOTE — Therapy (Signed)
 OUTPATIENT SPEECH LANGUAGE PATHOLOGY TREATMENT   Patient Name: Adrian [name] MRN: [id number] DOB:1956-07-17, 68 y.o., male Today's Date: 12/20/2023  PCP: NA REFERRING PROVIDER: Genetta Kenning, MD   END OF SESSION:  End of Session - 12/20/23 0929     Visit Number 12    Number of Visits 17    Date for SLP Re-Evaluation 01/07/24    SLP Start Time 0925    SLP Stop Time  1005    SLP Time Calculation (min) 40 min    Activity Tolerance Patient tolerated treatment well             Past Medical History:  Diagnosis Date   AF (paroxysmal atrial fibrillation) (HCC) 11/29/2018   Atrial flutter (HCC) 10/12/2015   a. TEE 3/17 with ? LAA clot-->s/p TEE/DCCV 11/24/2015; s/p DCCV 12/2022;s/p ablation 06/2023   CAD (coronary artery disease), native coronary artery 12/06/2015   a. 10/2015 MV: EF  37%, reversible defect inferior apex, intermediate risk findings; b. 10/2015 Cath: 20% mid RCA.   Carotid artery stenosis    1-39% right ICA stenosis and occluded left ICA   Colonic diverticular abscess    Diverticulitis    History of chemotherapy 2005   Cisplatin   Hypothyroidism    NICM (nonischemic cardiomyopathy) (HCC) 10/14/2015   a. Tachy mediated?;  b. Echo 3/17 - Mild concentric LVH, EF 30-35%, anteroseptal, anterior, anterolateral, apical anterior, lateral hypokinesis, trivial MR, mild to moderately reduced RVSF; c. LHC 3/17 - mRCA 20%   Radiation NOv.3,2005-Dec. 15, 2005   6810 cGy in 30 fractions   Tonsil cancer [facility]) 2005   Dr Ava Lei.  XRT   Past Surgical History:  Procedure Laterality Date   A-FLUTTER ABLATION N/A 06/19/2023   Procedure: A-FLUTTER ABLATION;  Surgeon: Boyce Byes, MD;  Location: [facility] INVASIVE CV LAB;  Service: Cardiovascular;  Laterality: N/A;   APPENDECTOMY N/A 03/07/2019   Procedure: ROBOTIC ASSISTED APPENDECTOMY;  Surgeon: Candyce Champagne, MD;  Location: [facility] ORS;  Service: General;  Laterality: N/A;   CARDIAC CATHETERIZATION N/A 10/18/2015   Procedure:  Left Heart Cath and Coronary Angiography;  Surgeon: Odie Benne, MD;  Location: [facility] INVASIVE CV LAB;  Service: Cardiovascular;  Laterality: N/A;   CARDIOVERSION N/A 11/24/2015   Procedure: CARDIOVERSION;  Surgeon: Loyde Rule, MD;  Location: [facility] ENDOSCOPY;  Service: Cardiovascular;  Laterality: N/A;   CARDIOVERSION N/A 12/20/2022   Procedure: CARDIOVERSION;  Surgeon: Bridgette Campus, MD;  Location: [facility] INVASIVE CV LAB;  Service: Cardiovascular;  Laterality: N/A;   CYSTOSCOPY WITH STENT PLACEMENT Bilateral 03/07/2019   Procedure: CYSTOSCOPY WITH BILATERAL FIREFLY INJECTION;  Surgeon: Andrez Banker, MD;  Location: [facility] ORS;  Service: Urology;  Laterality: Bilateral;   GASTROSTOMY TUBE PLACEMENT  07/04/2004   IR - G tube for tonsilar cancer   IR ANGIO INTRA EXTRACRAN SEL COM CAROTID INNOMINATE UNI R MOD SED  03/09/2019   IR ANGIO VERTEBRAL SEL VERTEBRAL UNI R MOD SED  03/09/2019   IR CT HEAD LTD  03/09/2019   IR PERCUTANEOUS ART THROMBECTOMY/INFUSION INTRACRANIAL INC DIAG ANGIO  03/09/2019   IR RADIOLOGIST EVAL & MGMT  12/18/2018   LAMINECTOMY     C5/placement of steel plate   LOOP RECORDER INSERTION N/A 06/19/2023   Procedure: LOOP RECORDER INSERTION;  Surgeon: Boyce Byes, MD;  Location: [facility] INVASIVE CV LAB;  Service: Cardiovascular;  Laterality: N/A;   NECK SURGERY  2003   replaced disk   RADIOLOGY WITH ANESTHESIA N/A 03/08/2019   Procedure:  IR WITH ANESTHESIA;  Surgeon: Luellen Sages, MD;  Location: [facility] OR;  Service: Radiology;  Laterality: N/A;   TEE WITHOUT CARDIOVERSION N/A 10/15/2015   Procedure: TRANSESOPHAGEAL ECHOCARDIOGRAM (TEE);  Surgeon: Darlis Eisenmenger, MD;  Location: [facility] ENDOSCOPY;  Service: Cardiovascular;  Laterality: N/A;   TEE WITHOUT CARDIOVERSION N/A 11/24/2015   Procedure: TRANSESOPHAGEAL ECHOCARDIOGRAM (TEE);  Surgeon: Loyde Rule, MD;  Location: [facility] ENDOSCOPY;  Service: Cardiovascular;  Laterality: N/A;   TEE WITHOUT CARDIOVERSION N/A 12/20/2022   Procedure:  TRANSESOPHAGEAL ECHOCARDIOGRAM;  Surgeon: Bridgette Campus, MD;  Location: [facility] INVASIVE CV LAB;  Service: Cardiovascular;  Laterality: N/A;   XI ROBOTIC ASSISTED COLOSTOMY TAKEDOWN N/A 03/07/2019   Procedure: XI ROBOTIC ASSISTED LOW ANTERIOR RESECTION, RIGID PROCTOSCOPY;  Surgeon: Candyce Champagne, MD;  Location: [facility] ORS;  Service: General;  Laterality: N/A;   Patient Active Problem List   Diagnosis Date Noted   Carotid occlusion, left 10/23/2023   ICH (intracerebral hemorrhage) (HCC) 08/11/2023   Carotid artery stenosis    Atrial flutter (HCC) 12/19/2022   Essential hypertension 04/22/2021   Hypokalemia 03/12/2019   Expressive aphasia 03/10/2019   Dysphagia 03/10/2019   Middle cerebral artery embolism, left 03/09/2019   Diverticular stricture (HCC) 03/07/2019   Bacteria in urine    Chronic atrial fibrillation (HCC) 12/02/2018   Current use of long term anticoagulation 12/02/2018   UTI (urinary tract infection) 11/29/2018   Hyponatremia 11/29/2018   PAF (paroxysmal atrial fibrillation) (HCC) 11/29/2018   Diverticulitis of large intestine with abscess 09/23/2018   Dyslipidemia 12/05/2017   CAD (coronary artery disease), native coronary artery 12/06/2015   SOB (shortness of breath)    NICM (nonischemic cardiomyopathy) (HCC)    Thyroid  activity decreased    Hypothyroidism 08/12/2013   History of radiation therapy 05/30/2012   Smokes tobacco daily 05/30/2012   Cancer of tonsillar fossa (HCC) 11/27/2011   Tonsil cancer (HCC) 2005   History of chemotherapy 2005    ONSET DATE: Referred on 10/26/23 (CVA in Jan)   REFERRING DIAG: I61.0 (ICD-10-CM) - Nontraumatic subcortical hemorrhage of left cerebral hemisphere [facility])   THERAPY DIAG:  Aphasia  Cognitive communication deficit  Rationale for Evaluation and Treatment: Rehabilitation  SUBJECTIVE:   SUBJECTIVE STATEMENT: Pt has been trying word finding strategies at home.   Pt accompanied by: self  PERTINENT HISTORY: Per chart review:  68 y.o. male with history of atrial fibrillation, on Eliquis , carotid artery stenosis, hypothyroidism , CVA 1/25  PAIN:  Are you having pain? No  FALLS: Has patient fallen in last 6 months?  No  LIVING ENVIRONMENT: Lives with: lives alone Lives in: House/apartment Ex Wife will be driving to appointments (or friends) Sheryle Donning  PLOF:  Level of assistance: Independent with ADLs, Independent with IADLs Employment: Retired; [employer]   PATIENT GOALS: memory, word finding, speech   OBJECTIVE:  Note: Objective measures were completed at Evaluation unless otherwise noted.  DIAGNOSTIC FINDINGS: Per Chart Review  CT HEAD CODE STROKE WO CONTRAST 08/11/23  IMPRESSION: 1.8 x 2.0 x 1.1 cm parenchymal hemorrhage centered in the left basal ganglia with intraventricular extension. No evidence of hydrocephalus at the time of exam. No significant midline shift.   Findings were paged to Dr. Murvin Arthurs on 08/11/23 at 2:29 PM     Electronically Signed   By: Clora Dane M.D.   On: 08/11/2023 14:31  COGNITION: Overall cognitive status: Impaired Areas of impairment:  Attention: Impaired: Selective, Alternating, Divided Memory: Impaired: Short term Prospective Executive function: Impaired: Problem solving, Organization, Planning, and  Slow processing Functional deficits: Reports difficulty with recall of important information  AUDITORY COMPREHENSION: Overall auditory comprehension: Appears intact YES/NO questions: Appears intact Following directions: Appears intact Conversation: Complex Interfering components: attention and processing speed Effective technique: extra processing time, pausing, and repetition/stressing words  READING COMPREHENSION: Intact  EXPRESSION: verbal  VERBAL EXPRESSION: Level of generative/spontaneous verbalization: conversation  Comments: Anomia  Non-verbal means of communication: N/A  WRITTEN EXPRESSION: Dominant hand: right Written expression: Not  tested  MOTOR SPEECH: Overall motor speech: impaired Level of impairment: Conversation Respiration: WFL Phonation: normal Resonance: WFL Articulation: Appears intact; pt reports slurred speech when feeling tired Intelligibility: Intelligible; 100% intelligible at eval; reports worsening speech when fatigued Motor planning: Appears intact Motor speech errors: NA Interfering components: NA Effective technique: slow rate and over articulate  ORAL MOTOR EXAMINATION: Overall status: Impaired:   Labial: Right (Symmetry) Facial: Right (Symmetry) *ROM is good. At rest, facial droop on R side. Comments: NA   STANDARDIZED ASSESSMENTS: Initiated CLQT - to complete next session    Cognitive Linguistic Quick Test: AGE - 18 - 69   The Cognitive Linguistic Quick Test (CLQT) was administered to assess the relative status of five cognitive domains: attention, memory, language, executive functioning, and visuospatial skills. Scores from 10 tasks were used to estimate severity ratings (standardized for age groups 18-69 years and 70-89 years) for each domain, a clock drawing task, as well as an overall composite severity rating of cognition.       Task Score Criterion Cut Scores  Personal Facts 7/8 8  Symbol Cancellation 11/12 11  Confrontation Naming 10/10 10  Clock Drawing  11/13 12  Story Retelling 6/10 6  Symbol Trails 10/10 9  Generative Naming 3/9 5  Design Memory 6/6 5  Mazes  8/8 7  Design Generation 5/13 6     Cognitive Domain Composite Score Severity Rating  Attention 190/215 WNL  Memory 148/185 Mild  Executive Function 26/40 WNL  Language 26/37 Mild  Visuospatial Skills 95/105 WNL  Clock Drawing  11/13 Mild  Composite Severity Rating  WNL           PATIENT REPORTED OUTCOME MEASURES (PROM): Neuro QOL - Cognitive Short Form: 29                                                                                                                             TREATMENT  DATE:   12/20/23: Pt was seen for skilled ST services targeting cognitive-communication. Pt reported he has not been using any word finding strategies; though, he said he has been describing something if he can't think of the word. SLP explained this is a word finding strategy. SLP initiated education on memory strategies this session. Pt reports he has been having some trouble with memory re: forgot things on his shopping list (forgot his cell phone at home with his list). We discussed "the rule of 3" - 3 objects before leaving the house (keys, wallet, phone). Pt  reports he has been using "slow down" as his preferred dysarthria strategy. Pt was able to recall when he needed to be more cognizant of using these strategies. To continue with internal memory strategies next session.     12/12/23: Pt was seen for skilled ST services targeting word finding strategies. Pt participated in therapeutic exercise where he was asked to describe basic objects to SLP. SLP was able to deduce the object based off the description in 70% of exemplars. Pt benefited from prompts re: "what does it do/ what is it used for" which allowed pt to narrow down description as needed. To begin memory strategies next session.   12/11/23: Pt was seen for skilled ST services targeting cognitive-communication. Pt reports he has been attempting word finding strategies at home - but feels like he should challenge himself to do it without using strategies. SLP explained the purpose of strategies is to improve ability to communicate thoughts/word more efficiently. Pt reports he gets frustrated that he is not able to do it as quickly as he would like. SLP completed education on attention strategies - pt reports "I am not really doing anything at home or around a bunch of people", and does not feel like he could benefit from these. To continue with word finding practice next session.   12/05/23: Pt was seen for skilled ST services targeting  cognitive-communication. Pt reports he has not been trying to use word finding strategies at home. SLP to continue practice of word finding strategies to encourage transfer of strategies to home/community. SLP initiated education on attention strategies this session re: managing fatigue - spoon theory, managing distractions, brain breaks. To cont with strategy education next session.    12/03/23: Pt was seen for skilled ST services. Pt reports speech intelligibility has been good, but still struggling when feeling more tired. He reports he has been mindful of dysarthria strategies during these times re: pausing and increasing loudness. Pt feels word finding is the most difficult for him at this time and would like to focus on this. He reports he also has trouble understanding the TV. SLP suggested putting subtitles on the TV to see if this is helpful. Pt reported he had an instance where he could not think of the name "iris". Pt did not use a strategy in this instance. SLP suggested trying "look it up" and demonstrated how to complete this on google. SLP facilitated use of "look it up" strategy with patient using iphone. Pt was able to generate adequate descriptions to find missing words for [employer], his favorite pizza place. Pt participated in informal conversation with no instances of anomia.   11/28/23: Pt was seen for skilled ST services targeting cognitive-communication. SLP edu on using microphone speech-to-text to make notes to support memory. Pt reports he did not do any writing since he was last here - but he has been thinking about it. SLP included writing in today's therapeutic task re: creation of dysarthria BE CLEAR sentences. Pt was able to generate and produce sentences with intelligible speech independently. Pt's speech continues to be 100% intelligible; however, pt reports this is not the case when he is fatigued. He understands this is when he should be most cognizant of using dysarthria  strategies. Pt was tasked with writing 5, SLP-dictated, functional sentences. Pt required modA to spell words correctly. Pt was demonstrate use of speech to text to assist in error correction of sentences.   11/26/23: Pt was seen for skilled ST services targeting cognitive-communication.  Pt reports he used "synonym" word finding strategy in an instance of anomia over the week. Pt reports he hasn't had any other significant difficulty with word finding. We reviewed "Be Clear" strategies for dysarthria re: speaking louder, over-articulating, slow down. SLP checked-in on pt's feelings regarding cognitive difficulties. He reports he hasn't noticed any significant problems; however, he realizes he has forgotten to bring his folder in x2. SLP suggested writing down tasks. Pt reported difficulty with writing from his previous strokes. SLP suggested using speech to text. Pt reported he has been leaving his cell phone at home. SLP encouraged him to always keep his cell nearby in case of emergencies, as well as, a memory tool. Pt attempted to write list of tasks needed to complete. Spelling errors noted. SLP encouraged pt to spell the word in his head prior to writing it down vs dividing attention between trying to figure out how to spell the word while attempting to write. Pt required max cue reminders to first spell the word in his head before attempting to write. Pt was noted to reduce errors by ~50% using therapist strategy.   11/20/23: Pt was seen for skilled ST services targeting cognitive-communication and word finding strategies. Pt completed Cognitive PROMs measure - see above in blue. Pt reports he hasn't had any significant instances of anomia - given a 2-3 second delay, word typically pops out. Pt reports he is to go golfing (on the putting green) with a friend for his birthday. We discussed word finding strategies he could use in conversation. SLP facilitated word finding practice using multiple meaning words.  Pt was tasked with finding the synonym (or a phrase close) for the given word. SLP challenged the patient to find synonyms to different golf terms in preparation for his outing. SLP briefly educated on "Be Clear" for dysarthria. To continue next session.   11/15/23: Pt was seen for skilled ST services targeting cognitive-communication and word finding strategies. SLP initiated education on word finding. SLP provided examples of each strategies and how to use in conversation. Pt reported he feels he would like to the following word finding strategies re: delay, describe, association, look it up. Pt was noted to have errors when reading aloud which was impacting his reading comprehension. Pt was noted to delete sounds from words  and add functor words while reading. SLP trialed re: pointing to each word with pen, re-reading aloud, re-reading silently to remove expressive component. To demonstrate "describe" strategy through Semantic Feature Analysis (SFA) next session.   11/09/23: Pt was seen for skilled ST services targeting completed cognitive-communication testing and education. SLP reviewed CLQT results with patient. He had no further questions. SLP provided pt with cognitive-communication handout. Pt reported difficulty with re: recalling details in conversations, understanding TV shows, difficulty with expressing thoughts, and reading/writing.  Pt has residual aphasia from previous stroke. To provide strategies in this POC.   11/07/23: Provided brief education on cognitive-communication disorder and speech intelligibility.   PATIENT EDUCATION: Education details: SLP role in cognitive-communication impairments Person educated: Patient Education method: Explanation Education comprehension: needs further education   GOALS: Goals reviewed with patient? No; to update after further evaluation  SHORT TERM GOALS: Target date: 12/08/23  Continue Cognitive testing to determine needs Baseline: Goal status:  MET  2.  Complete PROMs measure Baseline:  Goal status: MET  3.  Pt will recall 3 word finding strategies to use in instances of anomia with independently. Baseline:  Goal status: INITIAL  4.  Pt will  verbalize 2 attention strategies to improve concentration during functional tasks.  Baseline:  Goal status: INITIAL  5. Pt will verbalize 2 memory strategies for recall of important information.  Baseline:  Goal status: INITIAL    LONG TERM GOALS: Target date: 01/08/24  Pt will improve score on PROMs measure Baseline:  Goal status: INITIAL  2.  Pt will report successful use of word finding strategies at home and in the community. Baseline:  Goal status: INITIAL  3.  Pt will report successful use of attention strategies at home and in the community. Baseline:  Goal status: INITIAL  4.  Pt will report successful use of memory strategies at home and in the community.  Baseline:  Goal status: INITIAL   ASSESSMENT:  CLINICAL IMPRESSION: Pt is a 68 yo male who presents to ST OP for evaluation post recent CVA in Jan. Pt lives alone and will be driven to appointments by ex-wife or friends. Pt endorses speech changes, in addition to sx associated with cognitive-communication challenges [id number] of new information, word finding, and slowed processing.  Pt was assessed using CLQT - to complete next session. SLP observed difficulty with short term memory, instances of anomia, and very mild dysarthria. Pt was 100% intelligible in quiet, therapy room to an unfamiliar listener. He reports speech is worse when feeling tired. SLP to provide strategies for pt use in moments where speech may be unclear. Pt reports no difficulty with swallowing at this time and is tolerating a regular/thin diet. SLP rec skilled ST services to address cognitive-communication impairment and dysarthria to maximize functional independence and increase QOL.     OBJECTIVE IMPAIRMENTS: include memory, executive  functioning, expressive language, and dysarthria. These impairments are limiting patient from effectively communicating at home and in community. Factors affecting potential to achieve goals and functional outcome are NA. Patient will benefit from skilled SLP services to address above impairments and improve overall function.  REHAB POTENTIAL: Good  PLAN:  SLP FREQUENCY: 1-2x/week  SLP DURATION: 8 weeks  PLANNED INTERVENTIONS: Environmental controls, Cueing hierachy, Internal/external aids, Functional tasks, SLP instruction and feedback, Compensatory strategies, Patient/family education, and [id number] Treatment of speech (30 or 45 min)     [employer], CCC-SLP 12/20/2023, 9:30 AM

## 2023-12-20 NOTE — Therapy (Signed)
 OUTPATIENT PHYSICAL THERAPY TREATMENT    Patient Name: Adrian [name] MRN: [id number] DOB:April 29, 1956, 68 y.o., male Today's Date: 12/20/2023      END OF SESSION:  PT End of Session - 12/20/23 1104     Visit Number 14    Number of Visits 17    Date for PT Re-Evaluation 12/26/23    Authorization Type MCR    Authorization Time Period 10/31/23 to 12/26/23    Progress Note Due on Visit 20    PT Start Time 1102    PT Stop Time 1142    PT Time Calculation (min) 40 min    Activity Tolerance Patient tolerated treatment well    Behavior During Therapy [facility] for tasks assessed/performed                          Past Medical History:  Diagnosis Date   AF (paroxysmal atrial fibrillation) (HCC) 11/29/2018   Atrial flutter (HCC) 10/12/2015   a. TEE 3/17 with ? LAA clot-->s/p TEE/DCCV 11/24/2015; s/p DCCV 12/2022;s/p ablation 06/2023   CAD (coronary artery disease), native coronary artery 12/06/2015   a. 10/2015 MV: EF  37%, reversible defect inferior apex, intermediate risk findings; b. 10/2015 Cath: 20% mid RCA.   Carotid artery stenosis    1-39% right ICA stenosis and occluded left ICA   Colonic diverticular abscess    Diverticulitis    History of chemotherapy 2005   Cisplatin   Hypothyroidism    NICM (nonischemic cardiomyopathy) (HCC) 10/14/2015   a. Tachy mediated?;  b. Echo 3/17 - Mild concentric LVH, EF 30-35%, anteroseptal, anterior, anterolateral, apical anterior, lateral hypokinesis, trivial MR, mild to moderately reduced RVSF; c. LHC 3/17 - mRCA 20%   Radiation NOv.3,2005-Dec. 15, 2005   6810 cGy in 30 fractions   Tonsil cancer [facility]) 2005   Dr Ava Lei.  XRT   Past Surgical History:  Procedure Laterality Date   A-FLUTTER ABLATION N/A 06/19/2023   Procedure: A-FLUTTER ABLATION;  Surgeon: Boyce Byes, MD;  Location: [facility] INVASIVE CV LAB;  Service: Cardiovascular;  Laterality: N/A;   APPENDECTOMY N/A 03/07/2019   Procedure: ROBOTIC ASSISTED APPENDECTOMY;   Surgeon: Candyce Champagne, MD;  Location: [facility] ORS;  Service: General;  Laterality: N/A;   CARDIAC CATHETERIZATION N/A 10/18/2015   Procedure: Left Heart Cath and Coronary Angiography;  Surgeon: Odie Benne, MD;  Location: [facility] INVASIVE CV LAB;  Service: Cardiovascular;  Laterality: N/A;   CARDIOVERSION N/A 11/24/2015   Procedure: CARDIOVERSION;  Surgeon: Loyde Rule, MD;  Location: [facility] ENDOSCOPY;  Service: Cardiovascular;  Laterality: N/A;   CARDIOVERSION N/A 12/20/2022   Procedure: CARDIOVERSION;  Surgeon: Bridgette Campus, MD;  Location: [facility] INVASIVE CV LAB;  Service: Cardiovascular;  Laterality: N/A;   CYSTOSCOPY WITH STENT PLACEMENT Bilateral 03/07/2019   Procedure: CYSTOSCOPY WITH BILATERAL FIREFLY INJECTION;  Surgeon: Andrez Banker, MD;  Location: [facility] ORS;  Service: Urology;  Laterality: Bilateral;   GASTROSTOMY TUBE PLACEMENT  07/04/2004   IR - G tube for tonsilar cancer   IR ANGIO INTRA EXTRACRAN SEL COM CAROTID INNOMINATE UNI R MOD SED  03/09/2019   IR ANGIO VERTEBRAL SEL VERTEBRAL UNI R MOD SED  03/09/2019   IR CT HEAD LTD  03/09/2019   IR PERCUTANEOUS ART THROMBECTOMY/INFUSION INTRACRANIAL INC DIAG ANGIO  03/09/2019   IR RADIOLOGIST EVAL & MGMT  12/18/2018   LAMINECTOMY     C5/placement of steel plate   LOOP RECORDER INSERTION N/A 06/19/2023   Procedure:  LOOP RECORDER INSERTION;  Surgeon: Boyce Byes, MD;  Location: [facility] INVASIVE CV LAB;  Service: Cardiovascular;  Laterality: N/A;   NECK SURGERY  2003   replaced disk   RADIOLOGY WITH ANESTHESIA N/A 03/08/2019   Procedure: IR WITH ANESTHESIA;  Surgeon: Luellen Sages, MD;  Location: [facility] OR;  Service: Radiology;  Laterality: N/A;   TEE WITHOUT CARDIOVERSION N/A 10/15/2015   Procedure: TRANSESOPHAGEAL ECHOCARDIOGRAM (TEE);  Surgeon: Darlis Eisenmenger, MD;  Location: [facility] ENDOSCOPY;  Service: Cardiovascular;  Laterality: N/A;   TEE WITHOUT CARDIOVERSION N/A 11/24/2015   Procedure: TRANSESOPHAGEAL ECHOCARDIOGRAM (TEE);  Surgeon: Loyde Rule, MD;  Location: [facility] ENDOSCOPY;  Service: Cardiovascular;  Laterality: N/A;   TEE WITHOUT CARDIOVERSION N/A 12/20/2022   Procedure: TRANSESOPHAGEAL ECHOCARDIOGRAM;  Surgeon: Bridgette Campus, MD;  Location: [facility] INVASIVE CV LAB;  Service: Cardiovascular;  Laterality: N/A;   XI ROBOTIC ASSISTED COLOSTOMY TAKEDOWN N/A 03/07/2019   Procedure: XI ROBOTIC ASSISTED LOW ANTERIOR RESECTION, RIGID PROCTOSCOPY;  Surgeon: Candyce Champagne, MD;  Location: [facility] ORS;  Service: General;  Laterality: N/A;   Patient Active Problem List   Diagnosis Date Noted   Carotid occlusion, left 10/23/2023   ICH (intracerebral hemorrhage) (HCC) 08/11/2023   Carotid artery stenosis    Atrial flutter (HCC) 12/19/2022   Essential hypertension 04/22/2021   Hypokalemia 03/12/2019   Expressive aphasia 03/10/2019   Dysphagia 03/10/2019   Middle cerebral artery embolism, left 03/09/2019   Diverticular stricture (HCC) 03/07/2019   Bacteria in urine    Chronic atrial fibrillation (HCC) 12/02/2018   Current use of long term anticoagulation 12/02/2018   UTI (urinary tract infection) 11/29/2018   Hyponatremia 11/29/2018   PAF (paroxysmal atrial fibrillation) (HCC) 11/29/2018   Diverticulitis of large intestine with abscess 09/23/2018   Dyslipidemia 12/05/2017   CAD (coronary artery disease), native coronary artery 12/06/2015   SOB (shortness of breath)    NICM (nonischemic cardiomyopathy) (HCC)    Thyroid  activity decreased    Hypothyroidism 08/12/2013   History of radiation therapy 05/30/2012   Smokes tobacco daily 05/30/2012   Cancer of tonsillar fossa (HCC) 11/27/2011   Tonsil cancer (HCC) 2005   History of chemotherapy 2005    PCP: Glean Lamy MD   REFERRING PROVIDER: Genetta Kenning, MD  REFERRING DIAG: I61.0 (ICD-10-CM) - Nontraumatic subcortical hemorrhage of left cerebral hemisphere [facility])  THERAPY DIAG:  Muscle weakness (generalized)  Other abnormalities of gait and mobility  Other lack of  coordination  Difficulty in walking, not elsewhere classified  Rationale for Evaluation and Treatment: Rehabilitation  ONSET DATE: 08/11/23  SUBJECTIVE:   SUBJECTIVE STATEMENT:  Getting a little frustrated that recovery is taking awhile, wish it was faster. Walking down steps is getting a little easier, walking outside is still a little difficult too especially going down hill- worried about falling forward.   PERTINENT HISTORY: 1.  History of left basal ganglia hemorrhage approximately 2 and half months ago.  He has completed inpatient rehabilitation as well as home health.  I do think he would be a great candidate for outpatient therapy.  He has gotten back to modified independent level but still has some mild weakness on the right side as well as fine motor issues and motor control issues.  His balance is also off.  He continues wear right AFO because of foot and ankle weakness.  Patient has some short-term memory issues as well Will make referral for outpatient PT OT and speech therapy Physical medicine rehab follow-up in 3 months  PAIN:  Are you having pain? No  0/10 now   PRECAUTIONS: Fall and Other: R hemi, has loop recorder   RED FLAGS: None   WEIGHT BEARING RESTRICTIONS: No  FALLS:  Has patient fallen in last 6 months? No  LIVING ENVIRONMENT: Lives with: lives alone Lives in: House/apartment Stairs: 2 STE with rail  Has following equipment at home: Single point cane and Environmental consultant - 2 wheeled  OCCUPATION: retired- used to work for [employer]   PLOF: Independent, Independent with basic ADLs, Independent with gait, Independent with transfers, and Requires assistive device for independence  PATIENT GOALS: walk straight without a cane, get back to driving and motorcycling   NEXT MD VISIT: Referring 01/25/24  OBJECTIVE:  Note: Objective measures were completed at Evaluation unless otherwise noted.  DIAGNOSTIC FINDINGS:   CLINICAL DATA:  Stroke, follow up Neuro deficit, acute,  stroke suspected   EXAM: MRI HEAD WITHOUT AND WITH CONTRAST   TECHNIQUE: Multiplanar, multiecho pulse sequences of the brain and surrounding structures were obtained without and with intravenous contrast.   CONTRAST:  6.5mL GADAVIST  GADOBUTROL  1 MMOL/ML IV SOLN   COMPARISON:  CT head from today.   FINDINGS: Brain: When comparing across modalities, no substantial change in intraparenchymal hemorrhage in the left basal ganglia with intraventricular extension. No visible surrounding acute hemorrhage or mass lesion; however, acute blood products limits assessment. Prior left frontal infarct with encephalomalacia. No hydrocephalus. No pathologic enhancement.   Vascular: Major arterial flow voids are maintained at the skull base.   Skull and upper cervical spine: Normal marrow signal.   Sinuses/Orbits: Right frontal sinus opacification. Remaining sinuses are clear. No acute orbital findings.   Other: No mastoid effusions.   IMPRESSION: 1. When comparing across modalities, no substantial change in intraparenchymal hemorrhage in the left basal ganglia with intraventricular extension. No progressive mass effect. 2. No visible surrounding acute hemorrhage or mass lesion; however, acute blood products limits assessment  EXAM: CT HEAD WITHOUT CONTRAST   TECHNIQUE: Contiguous axial images were obtained from the base of the skull through the vertex without intravenous contrast.   RADIATION DOSE REDUCTION: This exam was performed according to the departmental dose-optimization program which includes automated exposure control, adjustment of the mA and/or kV according to patient size and/or use of iterative reconstruction technique.   COMPARISON:  Head CT 08/12/2023   FINDINGS: Brain: Interval decrease in size of the previously seen hemorrhage in the left basal ganglia, now measuring 10 x 7 mm, previously 20 x 12 mm. There is resolution of intraventricular blood products.  No hydrocephalus. No extra-axial fluid collection. No mass effect. No mass lesion. No CT evidence of an acute cortical infarct. There is chronic left MCA territory infarct involving the left opercular region.   Vascular: No hyperdense vessel or unexpected calcification.   Skull: Normal. Negative for fracture or focal lesion.   Sinuses/Orbits: No middle ear or mastoid effusion. Paranasal sinuses are notable for complete opacification of the right frontal sinus. Orbits are unremarkable.   Other: None.   IMPRESSION: No acute intracranial abnormality. Interval decrease in size of left basal ganglia hemorrhage, now measuring 10 x 7 mm, previously 20 x 12 mm. Resolution of intraventricular blood products. No hydrocephalus.    PATIENT SURVEYS:    Patient-Specific Activity Scoring Scheme  "0" represents "unable to perform." "10" represents "able to perform at prior level. 0 1 2 3 4 5 6 7 8 9  10 (Date and Score)   Activity Eval  12/03/23   1. Walking straight without  the cane  4   6  2. Strength in RLE   4 6   3. Coordinated movements of R LE  3 6  4.    5.    Score 3.7 6   Total score = sum of the activity scores/number of activities Minimum detectable change (90%CI) for average score = 2 points Minimum detectable change (90%CI) for single activity score = 3 points     COGNITION: Overall cognitive status: Within functional limits for tasks assessed     SENSATION: Not tested      LOWER EXTREMITY MMT:  MMT Right eval Left eval  Hip flexion 4 5  Hip extension    Hip abduction    Hip adduction    Hip internal rotation    Hip external rotation    Knee flexion 4 5  Knee extension 4 5  Ankle dorsiflexion 1 at best (in AFO, reports no movement in ankle)   Ankle plantarflexion    Ankle inversion    Ankle eversion     (Blank rows = not tested)    FUNCTIONAL TESTS:  5 times sit to stand: 12.8 seconds some off shift from R LE  Timed up and go (TUG):  deferred  3 minute walk test: 221ft [facility]     11/28/23 0001  Dynamic Gait Index  Level Surface 2  Change in Gait Speed 2  Gait with Horizontal Head Turns 2  Gait with Vertical Head Turns 2  Gait and Pivot Turn 2  Step Over Obstacle 2  Step Around Obstacles 2  Steps 1  Total Score 15     GAIT: Distance walked: 267ft  Assistive device utilized: Single point cane Level of assistance: Modified independence Comments: wide BOS, increased tone in R LE with gait pattern- limited knee ROM and ankle tends to invert a bit , mild unsteadiness with corners, limited rotation at trunk/hips                                                                                                                                 TREATMENT DATE:    12/20/23  Nustep seat 7 L6x8 minutes all four extremities  STS goblet squat with eccentric lower 7# x12  STS with 4 inch box under L foot/7# in R UE cues for even wt and good eccentric  Forward heel taps for eccentric control/strength R LE 4 inch step x10   Hill and curb training outside with spc, S and intermittent cues for sequencing but improving           12/12/23  Side step over blue air pad (R) + forward step over mini air pads + side step over blue air pad (L) + side step over blue air pad (R) + forward step over mini air pads + side step over blue air pad (R) x3 rounds  Lateral toe taps alternating to small air targets + forward step up then  step down on 4 inch box + alternating toe taps on bosu + step up then step down 4 inch box- difficulty maintaining good alignment with step down (1 round)   Nustep L6x8 minutes all four extremities  Gait outside over curbs and hills  with hurricane, general S and occasional cues for sequencing         12/11/23  Nustep L6x8 minutes all four extremities seat 7   One foot on BOSU/other on ground 3x30 seconds B PWR step laterally + reach to target at ground level on R x5 (standing) PWR step forward +  mini squat to [facility] press to mini squat + backwards PWR step x5 Cross midline reach/wt shift to RLE + STS with multimodal cues to maintain wt on RLE with transitions x8  Gait and curb training outside- slopes and hills on even and uneven surfaces, occasional cues for sequencing with curb navigation. Difficulty with eccentric quad work on hemi side  when going down hill     12/05/23  Nustep L6 x8 minutes all four extremities seat 7  Hip hikes x10 B min-mod cues for form  Forward step ups 6 inch step x12 B intermittent UE support  Gait training 218ft 1.5# R ankle for improved proprioception, cues for heel toe pattern especially push off with toe when about to enter swing phase  Backwards walking with 1.5# cues for improved step length L LE to promote increased stance time hemi LE    3 way taps off blue foam pad x10 B, difficulty due to weak hip ABD groups hemi LE  Side steps + forward/backward steps over blue foam pad x2 laps            12/03/23  Nustep L6x8 minutes all four extremities for w/u, promotion of reciprocal movement  Forward step ups 6 inch step R LE intermittent UE support for mm endurance and WB tone management   One foot on BOSU/other on solid surface 3x30 seconds B- cues to not lock out R knee Forward lunges onto BOSU x10 B- cues to not lock out R knee in stance, and to avoid R LE circumduction when lifting to top of BOSU Lateral lunges onto BOSU x10 B- cues to not lock out R knee in stance, and to avoid RLE circumduction with movement to top of ball  Gait with 1.5# R LE for improved proprioception x162ft x2- first lap with hurricane, second without. Noted increased LE circumduction without AD    11/28/23   Side step, step over forward and backward on blue foam pad in // bars  DGI re-test  Tandem walk with lateral toe tap each step, alternating blue foam pad // bars     STS with yellow ball in goblet hold x10, cues to avoid offshift from hemi side- placed  R foot back/L foot forward for majority of reps  Nustep L6x8 minutes all four extremities for reciprocal movement and functional activity tolerance  Heel toe steps on blue air pad for assist with rocking motion x10 B, cues to normalize heel-toe and swing through, followed by gait training with focus on heel-toe pattern and keeping hips level            PATIENT EDUCATION:  Education details: exam findings, POC, HEP, otherwise as above  Person educated: Patient Education method: Explanation, Demonstration, and Handouts Education comprehension: verbalized understanding, returned demonstration, and needs further education  HOME EXERCISE PROGRAM:  Access Code: [email or web address] URL: [email or web address] Date: 11/20/2023 Prepared by: Terrel Ferries  Exercises -  Heel Toe Raises with Counter Support  - 1 x daily - 7 x weekly - 2 sets - 10 reps - Stride Stance Weight Shift  - 1 x daily - 7 x weekly - 2 sets - 10 reps - Single Leg Balance with Clock Reach  - 1 x daily - 7 x weekly - 2 sets - 10 reps - Sit to Stand with Resistance Around Legs  - 1 x daily - 7 x weekly - 2 sets - 10 reps - Forward Step Over with Counter Support  - 1 x daily - 7 x weekly - 2 sets - 10 reps - Backward Step Over with Counter Support  - 1 x daily - 7 x weekly - 2 sets - 10 reps   ASSESSMENT:  CLINICAL IMPRESSION:            Pt arrives today doing OK, feeling a little frustrated due to perception of slow progress- assured him that feeling challenged in PT does not equal slow progress with functional tasks in the real world. We continued working on functional strengthening today with focus on eccentric control. Continued working on hill training today with progressions as tolerated.   From eval: Patient is a 68 y.o. M who was seen today for physical therapy evaluation and treatment for I61.0 (ICD-10-CM) - Nontraumatic subcortical hemorrhage of left cerebral hemisphere [facility]). He is very motivated and  moving a very high level given the hemorrhagic CVA. His main concerns are balance/coordination and getting back to walking without assistive device. Anticipate he will do very well with skilled PT services.   OBJECTIVE IMPAIRMENTS: Abnormal gait, decreased activity tolerance, decreased balance, decreased coordination, decreased knowledge of use of DME, decreased mobility, difficulty walking, decreased strength, and decreased safety awareness.   ACTIVITY LIMITATIONS: standing, squatting, stairs, transfers, and locomotion level  PARTICIPATION LIMITATIONS: driving, shopping, community activity, and yard work  PERSONAL FACTORS: Age, Behavior pattern, Fitness, Past/current experiences, Social background, Time since onset of injury/illness/exacerbation, and Transportation are also affecting patient's functional outcome.   REHAB POTENTIAL: Good  CLINICAL DECISION MAKING: Evolving/moderate complexity  EVALUATION COMPLEXITY: Moderate   GOALS: Goals reviewed with patient? No  SHORT TERM GOALS: Target date: 11/28/2023   Will be compliant with appropriate progressive HEP  Baseline: Goal status: MET 11/28/23  2.  Will score 14/24 on DGI  Baseline: 10 Goal status: MET 11/28/23 15/24  3.  Will be able to perform all functional transfers without compensation strategies or offshift from hemi LE  Baseline:  Goal status: ONGOING 11/28/23- progressing well but still present   4.  Will be able to ambulate household distances without AD, Mod(I) Baseline:  Goal status: ONGOING 11/28/23 still using cane 90% of the time     LONG TERM GOALS: Target date: 12/26/2023    MMT in R LE to be at least 4+/5 in hip and knee musculature  Baseline:  Goal status: INITIAL  2.  Will score 18/24 on DGI to show improved functional balance  Baseline:  Goal status: INITIAL  3.  Will be able to walk at least 270ft over uneven/unsteady surfaces without device and no more than S/ 531ft over even surfaces no device  Mod(I) Baseline:  Goal status: INITIAL  4.  Will be able to ascend and descend steps without rail and minimal compensation strategies  Baseline:  Goal status: INITIAL  5.  Will be compliant with advanced HEP vs gym based program at DC to maintain functional gains  Baseline:  Goal status: INITIAL  6.  PSFS to improve by at least 3 points  Baseline: 3.7 Goal status: ONGOING 12/03/23 6   PLAN:  PT FREQUENCY: 2x/week  PT DURATION: 8 weeks  PLANNED INTERVENTIONS: [id number]- PT Re-evaluation, 97110-Therapeutic exercises, 97530- Therapeutic activity, 97112- Neuromuscular re-education, 97535- Self Care, 82956- Manual therapy, [id number]- Gait training, [phone]- Orthotic Fit/training, [phone]- Aquatic Therapy, Stair training, Taping, Dry Needling, Cryotherapy, and Moist heat  PLAN FOR NEXT SESSION: objectives for recert, continue if he is making ongoing progress with skilled PT   Terrel Ferries, PT, DPT 12/20/23 11:44 AM

## 2023-12-20 NOTE — Therapy (Signed)
 OUTPATIENT OCCUPATIONAL THERAPY NEURO TREATMENT  Patient Name: Adrian [name] MRN: [id number] DOB:25-Feb-1956, 68 y.o., male Today's Date: 12/20/2023  PCP: Dr. Darren Em REFERRING PROVIDER: Dr. Sharl Davies  END OF SESSION:  OT End of Session - 12/20/23 1017     Visit Number 13    Number of Visits 25    Date for OT Re-Evaluation 01/23/24    Authorization Type Medicare                     Past Medical History:  Diagnosis Date   AF (paroxysmal atrial fibrillation) (HCC) 11/29/2018   Atrial flutter (HCC) 10/12/2015   a. TEE 3/17 with ? LAA clot-->s/p TEE/DCCV 11/24/2015; s/p DCCV 12/2022;s/p ablation 06/2023   CAD (coronary artery disease), native coronary artery 12/06/2015   a. 10/2015 MV: EF  37%, reversible defect inferior apex, intermediate risk findings; b. 10/2015 Cath: 20% mid RCA.   Carotid artery stenosis    1-39% right ICA stenosis and occluded left ICA   Colonic diverticular abscess    Diverticulitis    History of chemotherapy 2005   Cisplatin   Hypothyroidism    NICM (nonischemic cardiomyopathy) (HCC) 10/14/2015   a. Tachy mediated?;  b. Echo 3/17 - Mild concentric LVH, EF 30-35%, anteroseptal, anterior, anterolateral, apical anterior, lateral hypokinesis, trivial MR, mild to moderately reduced RVSF; c. LHC 3/17 - mRCA 20%   Radiation NOv.3,2005-Dec. 15, 2005   6810 cGy in 30 fractions   Tonsil cancer [facility]) 2005   Dr Ava Lei.  XRT   Past Surgical History:  Procedure Laterality Date   A-FLUTTER ABLATION N/A 06/19/2023   Procedure: A-FLUTTER ABLATION;  Surgeon: Boyce Byes, MD;  Location: [facility] INVASIVE CV LAB;  Service: Cardiovascular;  Laterality: N/A;   APPENDECTOMY N/A 03/07/2019   Procedure: ROBOTIC ASSISTED APPENDECTOMY;  Surgeon: Candyce Champagne, MD;  Location: [facility] ORS;  Service: General;  Laterality: N/A;   CARDIAC CATHETERIZATION N/A 10/18/2015   Procedure: Left Heart Cath and Coronary Angiography;  Surgeon: Odie Benne, MD;  Location: [facility]  INVASIVE CV LAB;  Service: Cardiovascular;  Laterality: N/A;   CARDIOVERSION N/A 11/24/2015   Procedure: CARDIOVERSION;  Surgeon: Loyde Rule, MD;  Location: [facility] ENDOSCOPY;  Service: Cardiovascular;  Laterality: N/A;   CARDIOVERSION N/A 12/20/2022   Procedure: CARDIOVERSION;  Surgeon: Bridgette Campus, MD;  Location: [facility] INVASIVE CV LAB;  Service: Cardiovascular;  Laterality: N/A;   CYSTOSCOPY WITH STENT PLACEMENT Bilateral 03/07/2019   Procedure: CYSTOSCOPY WITH BILATERAL FIREFLY INJECTION;  Surgeon: Andrez Banker, MD;  Location: [facility] ORS;  Service: Urology;  Laterality: Bilateral;   GASTROSTOMY TUBE PLACEMENT  07/04/2004   IR - G tube for tonsilar cancer   IR ANGIO INTRA EXTRACRAN SEL COM CAROTID INNOMINATE UNI R MOD SED  03/09/2019   IR ANGIO VERTEBRAL SEL VERTEBRAL UNI R MOD SED  03/09/2019   IR CT HEAD LTD  03/09/2019   IR PERCUTANEOUS ART THROMBECTOMY/INFUSION INTRACRANIAL INC DIAG ANGIO  03/09/2019   IR RADIOLOGIST EVAL & MGMT  12/18/2018   LAMINECTOMY     C5/placement of steel plate   LOOP RECORDER INSERTION N/A 06/19/2023   Procedure: LOOP RECORDER INSERTION;  Surgeon: Boyce Byes, MD;  Location: [facility] INVASIVE CV LAB;  Service: Cardiovascular;  Laterality: N/A;   NECK SURGERY  2003   replaced disk   RADIOLOGY WITH ANESTHESIA N/A 03/08/2019   Procedure: IR WITH ANESTHESIA;  Surgeon: Luellen Sages, MD;  Location: [facility] OR;  Service: Radiology;  Laterality: N/A;   TEE  WITHOUT CARDIOVERSION N/A 10/15/2015   Procedure: TRANSESOPHAGEAL ECHOCARDIOGRAM (TEE);  Surgeon: Darlis Eisenmenger, MD;  Location: [facility] ENDOSCOPY;  Service: Cardiovascular;  Laterality: N/A;   TEE WITHOUT CARDIOVERSION N/A 11/24/2015   Procedure: TRANSESOPHAGEAL ECHOCARDIOGRAM (TEE);  Surgeon: Loyde Rule, MD;  Location: [facility] ENDOSCOPY;  Service: Cardiovascular;  Laterality: N/A;   TEE WITHOUT CARDIOVERSION N/A 12/20/2022   Procedure: TRANSESOPHAGEAL ECHOCARDIOGRAM;  Surgeon: Bridgette Campus, MD;  Location: [facility] INVASIVE CV LAB;   Service: Cardiovascular;  Laterality: N/A;   XI ROBOTIC ASSISTED COLOSTOMY TAKEDOWN N/A 03/07/2019   Procedure: XI ROBOTIC ASSISTED LOW ANTERIOR RESECTION, RIGID PROCTOSCOPY;  Surgeon: Candyce Champagne, MD;  Location: [facility] ORS;  Service: General;  Laterality: N/A;   Patient Active Problem List   Diagnosis Date Noted   Carotid occlusion, left 10/23/2023   ICH (intracerebral hemorrhage) (HCC) 08/11/2023   Carotid artery stenosis    Atrial flutter (HCC) 12/19/2022   Essential hypertension 04/22/2021   Hypokalemia 03/12/2019   Expressive aphasia 03/10/2019   Dysphagia 03/10/2019   Middle cerebral artery embolism, left 03/09/2019   Diverticular stricture (HCC) 03/07/2019   Bacteria in urine    Chronic atrial fibrillation (HCC) 12/02/2018   Current use of long term anticoagulation 12/02/2018   UTI (urinary tract infection) 11/29/2018   Hyponatremia 11/29/2018   PAF (paroxysmal atrial fibrillation) (HCC) 11/29/2018   Diverticulitis of large intestine with abscess 09/23/2018   Dyslipidemia 12/05/2017   CAD (coronary artery disease), native coronary artery 12/06/2015   SOB (shortness of breath)    NICM (nonischemic cardiomyopathy) (HCC)    Thyroid  activity decreased    Hypothyroidism 08/12/2013   History of radiation therapy 05/30/2012   Smokes tobacco daily 05/30/2012   Cancer of tonsillar fossa (HCC) 11/27/2011   Tonsil cancer (HCC) 2005   History of chemotherapy 2005    ONSET DATE: 08/11/23  REFERRING DIAG: I61.0 (ICD-10-CM) - Nontraumatic subcortical hemorrhage of left cerebral hemisphere (HCC)   THERAPY DIAG:  No diagnosis found.  Rationale for Evaluation and Treatment: Rehabilitation  SUBJECTIVE:   SUBJECTIVE STATEMENT: Pt reports driving himself to therapy today  Pt accompanied by: self  PERTINENT HISTORY:   68 yo male hospitalized 08/11/23 with R sided weakness. CT showed a large basal ganglia ICH with IVH extension. PMH includes:  A-fib, carotid artery stenosis, CAD, tonsil  cancer, hypothyroidism, L MCA CVA.    PRECAUTIONS: Fall  WEIGHT BEARING RESTRICTIONS: No  PAIN:  Are you having pain? No  FALLS: Has patient fallen in last 6 months? No  LIVING ENVIRONMENT: Lives with: lives alone has friends and ex wife checks in on him Lives in: House/apartment Stairs: no Has following equipment at home: Single point cane and Tour manager  PLOF: Independent  PATIENT GOALS: improve use of RUE  OBJECTIVE:  Note: Objective measures were completed at Evaluation unless otherwise noted.  HAND DOMINANCE: Right  ADLs: Overall ADLs: mod I with all basic ADLS Transfers/ambulation related to ADLs: Eating: uses RUE 20%, currently using non dominant LUE,  Grooming: using non dominant LUE UB Dressing: mod I difficulty with buttons LB Dressing: mod I Toileting: mod I with cane Bathing: has tub bench Tub Shower transfers: mod I  Equipment: Transfer tub bench  IADLs: Shopping: needs assist for transportation Light housekeeping: Pt is performing light cleaning, he has occasional assist Meal Prep: microwave meals primarily, does a little stove top Medication management: pt handles  Financial management: pt handles Handwriting: 90% legibility, handwriting is small   MOBILITY STATUS: mod I with cane  ACTIVITY TOLERANCE: Activity tolerance: standing  UPPER EXTREMITY ROM:  grossly WFLS  UPPER EXTREMITY MMT:     MMT Right eval Left eval  Shoulder flexion 3+/5 4+/5  Shoulder abduction    Shoulder adduction    Shoulder extension    Shoulder internal rotation    Shoulder external rotation    Middle trapezius    Lower trapezius    Elbow flexion 4-/5 4-/5  Elbow extension 4-/5 4-/5  Wrist flexion    Wrist extension    Wrist ulnar deviation    Wrist radial deviation    Wrist pronation    Wrist supination    (Blank rows = not tested)  HAND FUNCTION: Grip strength: Right: 50 lbs; Left: 63 lbs  COORDINATION: 9 Hole Peg test: Right: 59.69 sec; Left:  29.35 sec  SENSATION:diminished in RUE  COGNITION: Overall cognitive status:unable to spell WORLD backwards correctly, recalls 2/3 words following a delay.  VISION ASSESSMENT: Not tested- denies visual changes   OBSERVATIONS: Pleasant gentleman motivated to improve                                                                                                                              TREATMENT DATE: 12/20/23-Pt drove himself to therapy today despite prior recommendation that pt discusses with MD and seeks MD clearance. Pt provided with driving eval contacts and therapist recommends that pt performs graduated driving program with someone if MD clears him.  Therapist discussed concerns regarding reaction time as pt has cognitive changes as well as RUE weakness/ decreased control which may impact reaction time. Pt verbalized understanding Alternating attention tasks on constant therapy in prep for driving: Alternating symbols: levels 4, 6, 8 with 100% accuracy. closed chain shoulder flexion, then diagonals each direction for increased RUE control, min v.c for positioning. Pt ambulated 150 ft while carrying a simulated plate of food in right hand without drops.    12/12/23 Seated closed chain shoulder flexion, holding ball betwen palms then diagonals both directions, min v.c and facilitation for control. Red theraband exercises shoulder horizontal abduction, biceps curls, and ticeps extension, bilateral UE's for increased strength 10 reps each, min v.c  Dynamic functional reaching in standing to retrieve items from lower surface and place in low and middle shelves, supervision, min v.c, no LOB. Stacking blocks/ unstacking blocks with LUE for increased fine motor coordination and RUE control, min difficulty, v.c  Gripper set at level 3 to pick up 1/3 of blocks with RUE, min difficulty/ v.c Writing activities with foam grip initally, printing alphabet with v.c to print in capitals to meet line,  mi v.c Pt with improved legibility and letter size following practice, with min v.c Pt required min assist for spelling. End of session pt was able to complete a sentence using regular pen with good legibility and letter size.  12/11/23-Seated closed chain shoulder flexion, then diagonals both directions, min v.c and facilitation for control. Quadraped activities on mat for core  stability and UE strength, cat and cow, then liftin alternate UE, then lifting alternate LE, ball added under abdomen for increased stability, min-mod facilitation and v.c 10 reps each Fine motor coordination task placing grooved pegs then removing with in hand manipulation, min difficulty/ v.c Gripper set at level 2 sustained grip to pick up 1 inch blocks with RUE, min v.c Standing to perfrom functional reaching to place graded clothespins, 1-8# on targets with RUE, while retrieving from lower surface, supervision and min v.c UBE x 6 mins level 3 for conditioning.   12/05/23-Fine motor coordination task to put various sized pegs into pegboard, min v.c to avoid compensatory shoulder hike, removing pegs with in hand manipulation min difficulty/ v.c, Pt with improved dexterity today. Reveiwed red theraband HEP, ( shoulder horizontal abduction, biceps curls, triceps extension)10-15 reps each, min v.c for positioning). Pt performed shoulder extension  10 reps bilateral UE's in standing with red band however pt requires mod v.c for positioning, so this was not issued for home. Therapist checked short term goals for PN.  12/03/23 Standing to copy small peg design on vertical surface, mod difficulty/ drops and min v.c, increased time required, min errors for design, min v.c for in hand manipulation Ambulating 125 ft while carrying plate in right hand while using cane in left min v.c no drops. Closed chain shoulder flexion, then digaonals each direction with left and right UE's, min v.c for positioning      PATIENT  EDUCATION: Education details:see above  Education method: Explanation, Demonstration, Tactile cues, Verbal cues, Education comprehension: verbalized understanding, returned demonstration, and verbal cues required  HOME EXERCISE PROGRAM: beginning coordination 3/26 11/09/23  Cane HEP 4/10- biceps triceps with yellow band, shoulder flexion with paper towel roll  11/27/23- memory compensations GOALS: Goals reviewed with patient? Yes  SHORT TERM GOALS: Target date: 12/01/23  I with inital HEP Goal status: met for cane, beginning theraband 12/05/23  2.  Pt will report feeding himself at least 50% of the time with RUE. Baseline: uses 20% x Goal status:  met, per pt report 78% 11/28/23  3.  Pt will demonstrate improved RUE fine motor coordination for ADLs as evidenced by decreasing 9 hole peg test score to 52 secs or less Goal status: met , 43.26 secs  4.  Pt will report consistently brushing his teeth with RUE Goal status: ongoing, brushes teeth with RUE 50% of the time 12/05/23  5.  I with memory compensation strategies Goal status: met, issued 11/28/23  6.  Pt will report that he has resumed moderate level home management mod I Goal status:  ongoing, pt has started washing dishes and does laundry but not back to prior level. 12/05/23   LONG TERM GOALS: Target date: 01/23/24  I with updated HEP Goal status: ongoing, 12/12/23  2.  Pt will demonstrate improved RUE fine motor coordination for ADLs as evidenced by decreasing 9 hole peg test score to 48 secs or less Goal status: ongoing, 12/12/23  3.  Pt will demonstrate ability to carry a plate in RUE without spills. Goal status:met 12/20/23  4.  Pt will write a short paragraph with 100% legibility and minimal decrease in letter size. Goal status: ongoing, Pt demonstrates improved legibility/ letter size with practice, pt will benefit from reinforcement of strategies. 12/12/23  5.  Pt will resume use of RUE as dominant hand at least 75% of the  time for ADLs/ IADLs. Goal status: ongoing, uses 50% of the time 12/12/23  6. Pt will  retrieve a 3 lbs object from eye level shelf using RUE with good control  Goal status:ongoing 12/12/23  ASSESSMENT:  CLINICAL IMPRESSION: Pt is progressing towards goals. Pt demonstrates improved RUE functional use with ability to walk and carry a plte without drops.Pt demonstrates improved alternating attention today. Aaron AasPERFORMANCE DEFICITS: in functional skills including ADLs, IADLs, coordination, dexterity, sensation, strength, flexibility, Fine motor control, Gross motor control, mobility, balance, endurance, decreased knowledge of precautions, decreased knowledge of use of DME, and UE functional use, cognitive skills including attention, memory, problem solving, safety awareness, and thought, and psychosocial skills including coping strategies, environmental adaptation, habits, interpersonal interactions, and routines and behaviors.   IMPAIRMENTS: are limiting patient from ADLs, IADLs, play, leisure, and social participation.   CO-MORBIDITIES: may have co-morbidities  that affects occupational performance. Patient will benefit from skilled OT to address above impairments and improve overall function.  MODIFICATION OR ASSISTANCE TO COMPLETE EVALUATION: No modification of tasks or assist necessary to complete an evaluation.  OT OCCUPATIONAL PROFILE AND HISTORY: Detailed assessment: Review of records and additional review of physical, cognitive, psychosocial history related to current functional performance.  CLINICAL DECISION MAKING: LOW - limited treatment options, no task modification necessary  REHAB POTENTIAL: Good  EVALUATION COMPLEXITY: Low    PLAN:  OT FREQUENCY: 2x/week  OT DURATION: 12 weeks  PLANNED INTERVENTIONS: 97168 OT Re-evaluation, 97535 self care/ADL training, 29562 therapeutic exercise, 97530 therapeutic activity, 97112 neuromuscular re-education, 97140 manual therapy, 97113 aquatic  therapy, 97035 ultrasound, 97018 paraffin, 13086 moist heat, 97010 cryotherapy, 97034 contrast bath, 97014 electrical stimulation unattended, 97129 Cognitive training (first 15 min), 57846 Cognitive training(each additional 15 min), 96295 Orthotics management and training, 28413 Splinting (initial encounter), passive range of motion, balance training, functional mobility training, energy conservation, coping strategies training, patient/family education, and DME and/or AE instructions  RECOMMENDED OTHER SERVICES: n/a  CONSULTED AND AGREED WITH PLAN OF CARE: Patient  PLAN FOR NEXT SESSION:  fine motor coordination, gentle strength   Cameo Shewell, OTR/L 12/20/2023, 10:18 AM

## 2023-12-23 ENCOUNTER — Ambulatory Visit: Payer: Self-pay | Admitting: Cardiology

## 2023-12-24 ENCOUNTER — Ambulatory Visit: Admitting: Speech Pathology

## 2023-12-24 ENCOUNTER — Encounter: Payer: Self-pay | Admitting: Speech Pathology

## 2023-12-24 ENCOUNTER — Ambulatory Visit

## 2023-12-24 ENCOUNTER — Ambulatory Visit: Admitting: Occupational Therapy

## 2023-12-24 ENCOUNTER — Other Ambulatory Visit: Payer: Self-pay

## 2023-12-24 DIAGNOSIS — R2689 Other abnormalities of gait and mobility: Secondary | ICD-10-CM

## 2023-12-24 DIAGNOSIS — R2681 Unsteadiness on feet: Secondary | ICD-10-CM

## 2023-12-24 DIAGNOSIS — M6281 Muscle weakness (generalized): Secondary | ICD-10-CM | POA: Diagnosis not present

## 2023-12-24 DIAGNOSIS — R41840 Attention and concentration deficit: Secondary | ICD-10-CM

## 2023-12-24 DIAGNOSIS — R4701 Aphasia: Secondary | ICD-10-CM

## 2023-12-24 DIAGNOSIS — R41844 Frontal lobe and executive function deficit: Secondary | ICD-10-CM

## 2023-12-24 DIAGNOSIS — R41841 Cognitive communication deficit: Secondary | ICD-10-CM

## 2023-12-24 DIAGNOSIS — R278 Other lack of coordination: Secondary | ICD-10-CM

## 2023-12-24 DIAGNOSIS — R262 Difficulty in walking, not elsewhere classified: Secondary | ICD-10-CM

## 2023-12-24 NOTE — Patient Instructions (Signed)
 Yellow band Strengthening: Resisted Flexion   Hold tubing with _right____ arm(s) at side. Pull forward and up. Move shoulder through pain-free range of motion. Repeat __10__ times per set.  Do _1-2_ sessions per day , every other day   Strengthening: Resisted Extension   Hold tubing in __right___ hand(s), arm forward. Pull arm back, elbow straight. Repeat _10___ times per set. Do _1-2___ sessions per day, every other day.   Copyright  VHI. All rights reserved.

## 2023-12-24 NOTE — Therapy (Signed)
 OUTPATIENT OCCUPATIONAL THERAPY NEURO TREATMENT  Patient Name: Adrian [name] MRN: [id number] DOB:Sep 13, 1955, 68 y.o., male Today's Date: 12/24/2023  PCP: Dr. Darren Em REFERRING PROVIDER: Dr. Sharl Davies  END OF SESSION:  OT End of Session - 12/24/23 0929     Visit Number 14    Number of Visits 25    Date for OT Re-Evaluation 01/23/24    Authorization Type Medicare    Authorization - Visit Number 14    Progress Note Due on Visit 20    OT Start Time 0849    OT Stop Time 0930    OT Time Calculation (min) 41 min    Activity Tolerance Patient tolerated treatment well    Behavior During Therapy [facility] for tasks assessed/performed                      Past Medical History:  Diagnosis Date   AF (paroxysmal atrial fibrillation) (HCC) 11/29/2018   Atrial flutter (HCC) 10/12/2015   a. TEE 3/17 with ? LAA clot-->s/p TEE/DCCV 11/24/2015; s/p DCCV 12/2022;s/p ablation 06/2023   CAD (coronary artery disease), native coronary artery 12/06/2015   a. 10/2015 MV: EF  37%, reversible defect inferior apex, intermediate risk findings; b. 10/2015 Cath: 20% mid RCA.   Carotid artery stenosis    1-39% right ICA stenosis and occluded left ICA   Colonic diverticular abscess    Diverticulitis    History of chemotherapy 2005   Cisplatin   Hypothyroidism    NICM (nonischemic cardiomyopathy) (HCC) 10/14/2015   a. Tachy mediated?;  b. Echo 3/17 - Mild concentric LVH, EF 30-35%, anteroseptal, anterior, anterolateral, apical anterior, lateral hypokinesis, trivial MR, mild to moderately reduced RVSF; c. LHC 3/17 - mRCA 20%   Radiation NOv.3,2005-Dec. 15, 2005   6810 cGy in 30 fractions   Tonsil cancer [facility]) 2005   Dr Ava Lei.  XRT   Past Surgical History:  Procedure Laterality Date   A-FLUTTER ABLATION N/A 06/19/2023   Procedure: A-FLUTTER ABLATION;  Surgeon: Boyce Byes, MD;  Location: [facility] INVASIVE CV LAB;  Service: Cardiovascular;  Laterality: N/A;   APPENDECTOMY N/A 03/07/2019    Procedure: ROBOTIC ASSISTED APPENDECTOMY;  Surgeon: Candyce Champagne, MD;  Location: [facility] ORS;  Service: General;  Laterality: N/A;   CARDIAC CATHETERIZATION N/A 10/18/2015   Procedure: Left Heart Cath and Coronary Angiography;  Surgeon: Odie Benne, MD;  Location: [facility] INVASIVE CV LAB;  Service: Cardiovascular;  Laterality: N/A;   CARDIOVERSION N/A 11/24/2015   Procedure: CARDIOVERSION;  Surgeon: Loyde Rule, MD;  Location: [facility] ENDOSCOPY;  Service: Cardiovascular;  Laterality: N/A;   CARDIOVERSION N/A 12/20/2022   Procedure: CARDIOVERSION;  Surgeon: Bridgette Campus, MD;  Location: [facility] INVASIVE CV LAB;  Service: Cardiovascular;  Laterality: N/A;   CYSTOSCOPY WITH STENT PLACEMENT Bilateral 03/07/2019   Procedure: CYSTOSCOPY WITH BILATERAL FIREFLY INJECTION;  Surgeon: Andrez Banker, MD;  Location: [facility] ORS;  Service: Urology;  Laterality: Bilateral;   GASTROSTOMY TUBE PLACEMENT  07/04/2004   IR - G tube for tonsilar cancer   IR ANGIO INTRA EXTRACRAN SEL COM CAROTID INNOMINATE UNI R MOD SED  03/09/2019   IR ANGIO VERTEBRAL SEL VERTEBRAL UNI R MOD SED  03/09/2019   IR CT HEAD LTD  03/09/2019   IR PERCUTANEOUS ART THROMBECTOMY/INFUSION INTRACRANIAL INC DIAG ANGIO  03/09/2019   IR RADIOLOGIST EVAL & MGMT  12/18/2018   LAMINECTOMY     C5/placement of steel plate   LOOP RECORDER INSERTION N/A 06/19/2023   Procedure: LOOP RECORDER  INSERTION;  Surgeon: Boyce Byes, MD;  Location: [facility] INVASIVE CV LAB;  Service: Cardiovascular;  Laterality: N/A;   NECK SURGERY  2003   replaced disk   RADIOLOGY WITH ANESTHESIA N/A 03/08/2019   Procedure: IR WITH ANESTHESIA;  Surgeon: Luellen Sages, MD;  Location: [facility] OR;  Service: Radiology;  Laterality: N/A;   TEE WITHOUT CARDIOVERSION N/A 10/15/2015   Procedure: TRANSESOPHAGEAL ECHOCARDIOGRAM (TEE);  Surgeon: Darlis Eisenmenger, MD;  Location: [facility] ENDOSCOPY;  Service: Cardiovascular;  Laterality: N/A;   TEE WITHOUT CARDIOVERSION N/A 11/24/2015   Procedure:  TRANSESOPHAGEAL ECHOCARDIOGRAM (TEE);  Surgeon: Loyde Rule, MD;  Location: [facility] ENDOSCOPY;  Service: Cardiovascular;  Laterality: N/A;   TEE WITHOUT CARDIOVERSION N/A 12/20/2022   Procedure: TRANSESOPHAGEAL ECHOCARDIOGRAM;  Surgeon: Bridgette Campus, MD;  Location: [facility] INVASIVE CV LAB;  Service: Cardiovascular;  Laterality: N/A;   XI ROBOTIC ASSISTED COLOSTOMY TAKEDOWN N/A 03/07/2019   Procedure: XI ROBOTIC ASSISTED LOW ANTERIOR RESECTION, RIGID PROCTOSCOPY;  Surgeon: Candyce Champagne, MD;  Location: [facility] ORS;  Service: General;  Laterality: N/A;   Patient Active Problem List   Diagnosis Date Noted   Carotid occlusion, left 10/23/2023   ICH (intracerebral hemorrhage) (HCC) 08/11/2023   Carotid artery stenosis    Atrial flutter (HCC) 12/19/2022   Essential hypertension 04/22/2021   Hypokalemia 03/12/2019   Expressive aphasia 03/10/2019   Dysphagia 03/10/2019   Middle cerebral artery embolism, left 03/09/2019   Diverticular stricture (HCC) 03/07/2019   Bacteria in urine    Chronic atrial fibrillation (HCC) 12/02/2018   Current use of long term anticoagulation 12/02/2018   UTI (urinary tract infection) 11/29/2018   Hyponatremia 11/29/2018   PAF (paroxysmal atrial fibrillation) (HCC) 11/29/2018   Diverticulitis of large intestine with abscess 09/23/2018   Dyslipidemia 12/05/2017   CAD (coronary artery disease), native coronary artery 12/06/2015   SOB (shortness of breath)    NICM (nonischemic cardiomyopathy) (HCC)    Thyroid  activity decreased    Hypothyroidism 08/12/2013   History of radiation therapy 05/30/2012   Smokes tobacco daily 05/30/2012   Cancer of tonsillar fossa (HCC) 11/27/2011   Tonsil cancer (HCC) 2005   History of chemotherapy 2005    ONSET DATE: 08/11/23  REFERRING DIAG: I61.0 (ICD-10-CM) - Nontraumatic subcortical hemorrhage of left cerebral hemisphere (HCC)   THERAPY DIAG:  Muscle weakness (generalized)  Other abnormalities of gait and mobility  Other lack of  coordination  Frontal lobe and executive function deficit  Attention and concentration deficit  Unsteadiness on feet  Rationale for Evaluation and Treatment: Rehabilitation  SUBJECTIVE:   SUBJECTIVE STATEMENT: Pt reports he's doing ok  Pt accompanied by: self  PERTINENT HISTORY:   68 yo male hospitalized 08/11/23 with R sided weakness. CT showed a large basal ganglia ICH with IVH extension. PMH includes:  A-fib, carotid artery stenosis, CAD, tonsil cancer, hypothyroidism, L MCA CVA.    PRECAUTIONS: Fall  WEIGHT BEARING RESTRICTIONS: No  PAIN:  Are you having pain? No  FALLS: Has patient fallen in last 6 months? No  LIVING ENVIRONMENT: Lives with: lives alone has friends and ex wife checks in on him Lives in: House/apartment Stairs: no Has following equipment at home: Single point cane and Tour manager  PLOF: Independent  PATIENT GOALS: improve use of RUE  OBJECTIVE:  Note: Objective measures were completed at Evaluation unless otherwise noted.  HAND DOMINANCE: Right  ADLs: Overall ADLs: mod I with all basic ADLS Transfers/ambulation related to ADLs: Eating: uses RUE 20%, currently using non dominant LUE,  Grooming:  using non dominant LUE UB Dressing: mod I difficulty with buttons LB Dressing: mod I Toileting: mod I with cane Bathing: has tub bench Tub Shower transfers: mod I  Equipment: Transfer tub bench  IADLs: Shopping: needs assist for transportation Light housekeeping: Pt is performing light cleaning, he has occasional assist Meal Prep: microwave meals primarily, does a little stove top Medication management: pt handles  Financial management: pt handles Handwriting: 90% legibility, handwriting is small   MOBILITY STATUS: mod I with cane  ACTIVITY TOLERANCE: Activity tolerance: standing  UPPER EXTREMITY ROM:  grossly WFLS  UPPER EXTREMITY MMT:     MMT Right eval Left eval  Shoulder flexion 3+/5 4+/5  Shoulder abduction    Shoulder  adduction    Shoulder extension    Shoulder internal rotation    Shoulder external rotation    Middle trapezius    Lower trapezius    Elbow flexion 4-/5 4-/5  Elbow extension 4-/5 4-/5  Wrist flexion    Wrist extension    Wrist ulnar deviation    Wrist radial deviation    Wrist pronation    Wrist supination    (Blank rows = not tested)  HAND FUNCTION: Grip strength: Right: 50 lbs; Left: 63 lbs  COORDINATION: 9 Hole Peg test: Right: 59.69 sec; Left: 29.35 sec  SENSATION:diminished in RUE  COGNITION: Overall cognitive status:unable to spell WORLD backwards correctly, recalls 2/3 words following a delay.  VISION ASSESSMENT: Not tested- denies visual changes   OBSERVATIONS: Pleasant gentleman motivated to improve                                                                                                                              TREATMENT DATE: 12/24/23 Discussed potential d/c next week and started checking goals. Pt in agreement. see goals below.  Reviewed previously issued red theraband exercises for biceps, triceps and shoulder horizontal abduction 10-15 reps each. Therapist added low range yellow theraband exercises for shoulder flexion and extension, min v.c and demonstration for strengthening bilateral UE's.  12/20/23-Pt drove himself to therapy today despite prior recommendation that pt discusses with MD and seeks MD clearance. Pt provided with driving eval contacts and therapist recommends that pt performs graduated driving program with someone if MD clears him.  Therapist discussed concerns regarding reaction time as pt has cognitive changes as well as RUE weakness/ decreased control which may impact reaction time. Pt verbalized understanding Alternating attention tasks on constant therapy in prep for driving: Alternating symbols: levels 4, 6, 8 with 100% accuracy. closed chain shoulder flexion, then diagonals each direction for increased RUE control, min v.c for  positioning. Pt ambulated 150 ft while carrying a simulated plate of food in right hand without drops.    12/12/23 Seated closed chain shoulder flexion, holding ball betwen palms then diagonals both directions, min v.c and facilitation for control. Red theraband exercises shoulder horizontal abduction, biceps curls, and ticeps extension, bilateral UE's for increased strength 10  reps each, min v.c  Dynamic functional reaching in standing to retrieve items from lower surface and place in low and middle shelves, supervision, min v.c, no LOB. Stacking blocks/ unstacking blocks with LUE for increased fine motor coordination and RUE control, min difficulty, v.c  Gripper set at level 3 to pick up 1/3 of blocks with RUE, min difficulty/ v.c Writing activities with foam grip initally, printing alphabet with v.c to print in capitals to meet line, mi v.c Pt with improved legibility and letter size following practice, with min v.c Pt required min assist for spelling. End of session pt was able to complete a sentence using regular pen with good legibility and letter size.  12/11/23-Seated closed chain shoulder flexion, then diagonals both directions, min v.c and facilitation for control. Quadraped activities on mat for core stability and UE strength, cat and cow, then liftin alternate UE, then lifting alternate LE, ball added under abdomen for increased stability, min-mod facilitation and v.c 10 reps each Fine motor coordination task placing grooved pegs then removing with in hand manipulation, min difficulty/ v.c Gripper set at level 2 sustained grip to pick up 1 inch blocks with RUE, min v.c Standing to perfrom functional reaching to place graded clothespins, 1-8# on targets with RUE, while retrieving from lower surface, supervision and min v.c UBE x 6 mins level 3 for conditioning.   12/05/23-Fine motor coordination task to put various sized pegs into pegboard, min v.c to avoid compensatory shoulder hike,  removing pegs with in hand manipulation min difficulty/ v.c, Pt with improved dexterity today. Reveiwed red theraband HEP, ( shoulder horizontal abduction, biceps curls, triceps extension)10-15 reps each, min v.c for positioning). Pt performed shoulder extension  10 reps bilateral UE's in standing with red band however pt requires mod v.c for positioning, so this was not issued for home. Therapist checked short term goals for PN.  12/03/23 Standing to copy small peg design on vertical surface, mod difficulty/ drops and min v.c, increased time required, min errors for design, min v.c for in hand manipulation Ambulating 125 ft while carrying plate in right hand while using cane in left min v.c no drops. Closed chain shoulder flexion, then digaonals each direction with left and right UE's, min v.c for positioning      PATIENT EDUCATION: Education details:see above  Education method: Explanation, Demonstration, Tactile cues, Verbal cues, Education comprehension: verbalized understanding, returned demonstration, and verbal cues required  HOME EXERCISE PROGRAM: beginning coordination 3/26 11/09/23  Cane HEP 4/10- biceps triceps with yellow band, shoulder flexion with paper towel roll  11/27/23- memory compensations  12/24/23- red theraband for biceps, triceps and horizonatl abduction, yellow t-band for shoulder flexion/ extension GOALS: Goals reviewed with patient? Yes  SHORT TERM GOALS: Target date: 12/01/23  I with inital HEP Goal status: met for cane, beginning theraband 12/05/23  2.  Pt will report feeding himself at least 50% of the time with RUE. Baseline: uses 20% x Goal status:  met, per pt report 78% 11/28/23  3.  Pt will demonstrate improved RUE fine motor coordination for ADLs as evidenced by decreasing 9 hole peg test score to 52 secs or less Goal status: met , 43.26 secs  4.  Pt will report consistently brushing his teeth with RUE Goal status: partially met performs at least 90%  of the time 12/24/23  5.  I with memory compensation strategies Goal status: met, issued 11/28/23  6.  Pt will report that he has resumed moderate level home management mod I  Goal status:  ongoing-Pt reports washing dishes, and perfroming laundry, he is not back to vacuuming.12/24/23   LONG TERM GOALS: Target date: 01/23/24  I with updated HEP Goal status: ongoing, 12/12/23  2.  Pt will demonstrate improved RUE fine motor coordination for ADLs as evidenced by decreasing 9 hole peg test score to 48 secs or less Goal status: met, 42.57 secs 12/24/23  3.  Pt will demonstrate ability to carry a plate in RUE without spills. Goal status:met 12/20/23  4.  Pt will write a short paragraph with 100% legibility and minimal decrease in letter size. Goal status: ongoing, Pt demonstrates improved legibility/ letter size with practice, pt will benefit from reinforcement of strategies. 12/12/23  5.  Pt will resume use of RUE as dominant hand at least 75% of the time for ADLs/ IADLs. Goal status:met, 80% 12/24/23  6. Pt will retrieve a 3 lbs object from eye level shelf using RUE with good control  Goal status:met, pt performed x 5 reps 12/24/23  ASSESSMENT:  CLINICAL IMPRESSION: Pt is progressing towards goals. Pt met  long term goals: 2, 5, 6. He demosntrates improved coordination and strength.PERFORMANCE DEFICITS: in functional skills including ADLs, IADLs, coordination, dexterity, sensation, strength, flexibility, Fine motor control, Gross motor control, mobility, balance, endurance, decreased knowledge of precautions, decreased knowledge of use of DME, and UE functional use, cognitive skills including attention, memory, problem solving, safety awareness, and thought, and psychosocial skills including coping strategies, environmental adaptation, habits, interpersonal interactions, and routines and behaviors.   IMPAIRMENTS: are limiting patient from ADLs, IADLs, play, leisure, and social participation.    CO-MORBIDITIES: may have co-morbidities  that affects occupational performance. Patient will benefit from skilled OT to address above impairments and improve overall function.  MODIFICATION OR ASSISTANCE TO COMPLETE EVALUATION: No modification of tasks or assist necessary to complete an evaluation.  OT OCCUPATIONAL PROFILE AND HISTORY: Detailed assessment: Review of records and additional review of physical, cognitive, psychosocial history related to current functional performance.  CLINICAL DECISION MAKING: LOW - limited treatment options, no task modification necessary  REHAB POTENTIAL: Good  EVALUATION COMPLEXITY: Low    PLAN:  OT FREQUENCY: 2x/week  OT DURATION: 12 weeks  PLANNED INTERVENTIONS: 97168 OT Re-evaluation, 97535 self care/ADL training, 16109 therapeutic exercise, 97530 therapeutic activity, 97112 neuromuscular re-education, 97140 manual therapy, 97113 aquatic therapy, 97035 ultrasound, 97018 paraffin, 60454 moist heat, 97010 cryotherapy, 97034 contrast bath, 97014 electrical stimulation unattended, 97129 Cognitive training (first 15 min), 09811 Cognitive training(each additional 15 min), 91478 Orthotics management and training, 29562 Splinting (initial encounter), passive range of motion, balance training, functional mobility training, energy conservation, coping strategies training, patient/family education, and DME and/or AE instructions  RECOMMENDED OTHER SERVICES: n/a  CONSULTED AND AGREED WITH PLAN OF CARE: Patient  PLAN FOR NEXT SESSION:  work towards long term goals, anticipate d/c next week.   Shannie Kontos, OTR/L 12/24/2023, 12:31 PM

## 2023-12-24 NOTE — Therapy (Signed)
 OUTPATIENT SPEECH LANGUAGE PATHOLOGY TREATMENT   Patient Name: Adrian [name] MRN: [id number] DOB:1956-03-21, 68 y.o., male Today's Date: 12/24/2023  PCP: NA REFERRING PROVIDER: Genetta Kenning, MD   END OF SESSION:  End of Session - 12/24/23 0927     Visit Number 13    Number of Visits 17    Date for SLP Re-Evaluation 01/07/24    SLP Start Time 0930    SLP Stop Time  1010    SLP Time Calculation (min) 40 min    Activity Tolerance Patient tolerated treatment well             Past Medical History:  Diagnosis Date   AF (paroxysmal atrial fibrillation) (HCC) 11/29/2018   Atrial flutter (HCC) 10/12/2015   a. TEE 3/17 with ? LAA clot-->s/p TEE/DCCV 11/24/2015; s/p DCCV 12/2022;s/p ablation 06/2023   CAD (coronary artery disease), native coronary artery 12/06/2015   a. 10/2015 MV: EF  37%, reversible defect inferior apex, intermediate risk findings; b. 10/2015 Cath: 20% mid RCA.   Carotid artery stenosis    1-39% right ICA stenosis and occluded left ICA   Colonic diverticular abscess    Diverticulitis    History of chemotherapy 2005   Cisplatin   Hypothyroidism    NICM (nonischemic cardiomyopathy) (HCC) 10/14/2015   a. Tachy mediated?;  b. Echo 3/17 - Mild concentric LVH, EF 30-35%, anteroseptal, anterior, anterolateral, apical anterior, lateral hypokinesis, trivial MR, mild to moderately reduced RVSF; c. LHC 3/17 - mRCA 20%   Radiation NOv.3,2005-Dec. 15, 2005   6810 cGy in 30 fractions   Tonsil cancer [facility]) 2005   Dr Ava Lei.  XRT   Past Surgical History:  Procedure Laterality Date   A-FLUTTER ABLATION N/A 06/19/2023   Procedure: A-FLUTTER ABLATION;  Surgeon: Boyce Byes, MD;  Location: [facility] INVASIVE CV LAB;  Service: Cardiovascular;  Laterality: N/A;   APPENDECTOMY N/A 03/07/2019   Procedure: ROBOTIC ASSISTED APPENDECTOMY;  Surgeon: Candyce Champagne, MD;  Location: [facility] ORS;  Service: General;  Laterality: N/A;   CARDIAC CATHETERIZATION N/A 10/18/2015   Procedure:  Left Heart Cath and Coronary Angiography;  Surgeon: Odie Benne, MD;  Location: [facility] INVASIVE CV LAB;  Service: Cardiovascular;  Laterality: N/A;   CARDIOVERSION N/A 11/24/2015   Procedure: CARDIOVERSION;  Surgeon: Loyde Rule, MD;  Location: [facility] ENDOSCOPY;  Service: Cardiovascular;  Laterality: N/A;   CARDIOVERSION N/A 12/20/2022   Procedure: CARDIOVERSION;  Surgeon: Bridgette Campus, MD;  Location: [facility] INVASIVE CV LAB;  Service: Cardiovascular;  Laterality: N/A;   CYSTOSCOPY WITH STENT PLACEMENT Bilateral 03/07/2019   Procedure: CYSTOSCOPY WITH BILATERAL FIREFLY INJECTION;  Surgeon: Andrez Banker, MD;  Location: [facility] ORS;  Service: Urology;  Laterality: Bilateral;   GASTROSTOMY TUBE PLACEMENT  07/04/2004   IR - G tube for tonsilar cancer   IR ANGIO INTRA EXTRACRAN SEL COM CAROTID INNOMINATE UNI R MOD SED  03/09/2019   IR ANGIO VERTEBRAL SEL VERTEBRAL UNI R MOD SED  03/09/2019   IR CT HEAD LTD  03/09/2019   IR PERCUTANEOUS ART THROMBECTOMY/INFUSION INTRACRANIAL INC DIAG ANGIO  03/09/2019   IR RADIOLOGIST EVAL & MGMT  12/18/2018   LAMINECTOMY     C5/placement of steel plate   LOOP RECORDER INSERTION N/A 06/19/2023   Procedure: LOOP RECORDER INSERTION;  Surgeon: Boyce Byes, MD;  Location: [facility] INVASIVE CV LAB;  Service: Cardiovascular;  Laterality: N/A;   NECK SURGERY  2003   replaced disk   RADIOLOGY WITH ANESTHESIA N/A 03/08/2019   Procedure:  IR WITH ANESTHESIA;  Surgeon: Luellen Sages, MD;  Location: [facility] OR;  Service: Radiology;  Laterality: N/A;   TEE WITHOUT CARDIOVERSION N/A 10/15/2015   Procedure: TRANSESOPHAGEAL ECHOCARDIOGRAM (TEE);  Surgeon: Darlis Eisenmenger, MD;  Location: [facility] ENDOSCOPY;  Service: Cardiovascular;  Laterality: N/A;   TEE WITHOUT CARDIOVERSION N/A 11/24/2015   Procedure: TRANSESOPHAGEAL ECHOCARDIOGRAM (TEE);  Surgeon: Loyde Rule, MD;  Location: [facility] ENDOSCOPY;  Service: Cardiovascular;  Laterality: N/A;   TEE WITHOUT CARDIOVERSION N/A 12/20/2022   Procedure:  TRANSESOPHAGEAL ECHOCARDIOGRAM;  Surgeon: Bridgette Campus, MD;  Location: [place] INVASIVE CV LAB;  Service: Cardiovascular;  Laterality: N/A;   XI ROBOTIC ASSISTED COLOSTOMY TAKEDOWN N/A 03/07/2019   Procedure: XI ROBOTIC ASSISTED LOW ANTERIOR RESECTION, RIGID PROCTOSCOPY;  Surgeon: Candyce Champagne, MD;  Location: [facility] ORS;  Service: General;  Laterality: N/A;   Patient Active Problem List   Diagnosis Date Noted   Carotid occlusion, left 10/23/2023   ICH (intracerebral hemorrhage) (HCC) 08/11/2023   Carotid artery stenosis    Atrial flutter (HCC) 12/19/2022   Essential hypertension 04/22/2021   Hypokalemia 03/12/2019   Expressive aphasia 03/10/2019   Dysphagia 03/10/2019   Middle cerebral artery embolism, left 03/09/2019   Diverticular stricture (HCC) 03/07/2019   Bacteria in urine    Chronic atrial fibrillation (HCC) 12/02/2018   Current use of long term anticoagulation 12/02/2018   UTI (urinary tract infection) 11/29/2018   Hyponatremia 11/29/2018   PAF (paroxysmal atrial fibrillation) (HCC) 11/29/2018   Diverticulitis of large intestine with abscess 09/23/2018   Dyslipidemia 12/05/2017   CAD (coronary artery disease), native coronary artery 12/06/2015   SOB (shortness of breath)    NICM (nonischemic cardiomyopathy) (HCC)    Thyroid  activity decreased    Hypothyroidism 08/12/2013   History of radiation therapy 05/30/2012   Smokes tobacco daily 05/30/2012   Cancer of tonsillar fossa (HCC) 11/27/2011   Tonsil cancer (HCC) 2005   History of chemotherapy 2005    ONSET DATE: Referred on 10/26/23 (CVA in Jan)   REFERRING DIAG: I61.0 (ICD-10-CM) - Nontraumatic subcortical hemorrhage of left cerebral hemisphere [facility])   THERAPY DIAG:  Aphasia  Cognitive communication deficit  Rationale for Evaluation and Treatment: Rehabilitation  SUBJECTIVE:   SUBJECTIVE STATEMENT: Pt reports he met with a friend yesterday that he hadn't seen in awhile.   Pt accompanied by: self  PERTINENT  HISTORY: Per chart review: 68 y.o. male with history of atrial fibrillation, on Eliquis , carotid artery stenosis, hypothyroidism , CVA 1/25  PAIN:  Are you having pain? No  FALLS: Has patient fallen in last 6 months?  No  LIVING ENVIRONMENT: Lives with: lives alone Lives in: House/apartment Ex Wife will be driving to appointments (or friends) Sheryle Donning  PLOF:  Level of assistance: Independent with ADLs, Independent with IADLs Employment: Retired; [employer]   PATIENT GOALS: memory, word finding, speech   OBJECTIVE:  Note: Objective measures were completed at Evaluation unless otherwise noted.  DIAGNOSTIC FINDINGS: Per Chart Review  CT HEAD CODE STROKE WO CONTRAST 08/11/23  IMPRESSION: 1.8 x 2.0 x 1.1 cm parenchymal hemorrhage centered in the left basal ganglia with intraventricular extension. No evidence of hydrocephalus at the time of exam. No significant midline shift.   Findings were paged to Dr. Murvin Arthurs on 08/11/23 at 2:29 PM     Electronically Signed   By: Clora Dane M.D.   On: 08/11/2023 14:31  COGNITION: Overall cognitive status: Impaired Areas of impairment:  Attention: Impaired: Selective, Alternating, Divided Memory: Impaired: Short term Prospective Executive function: Impaired:  Problem solving, Organization, Planning, and Slow processing Functional deficits: Reports difficulty with recall of important information  AUDITORY COMPREHENSION: Overall auditory comprehension: Appears intact YES/NO questions: Appears intact Following directions: Appears intact Conversation: Complex Interfering components: attention and processing speed Effective technique: extra processing time, pausing, and repetition/stressing words  READING COMPREHENSION: Intact  EXPRESSION: verbal  VERBAL EXPRESSION: Level of generative/spontaneous verbalization: conversation  Comments: Anomia  Non-verbal means of communication: N/A  WRITTEN EXPRESSION: Dominant hand: right Written  expression: Not tested  MOTOR SPEECH: Overall motor speech: impaired Level of impairment: Conversation Respiration: WFL Phonation: normal Resonance: WFL Articulation: Appears intact; pt reports slurred speech when feeling tired Intelligibility: Intelligible; 100% intelligible at eval; reports worsening speech when fatigued Motor planning: Appears intact Motor speech errors: NA Interfering components: NA Effective technique: slow rate and over articulate  ORAL MOTOR EXAMINATION: Overall status: Impaired:   Labial: Right (Symmetry) Facial: Right (Symmetry) *ROM is good. At rest, facial droop on R side. Comments: NA   STANDARDIZED ASSESSMENTS: Initiated CLQT - to complete next session    Cognitive Linguistic Quick Test: AGE - 18 - 69   The Cognitive Linguistic Quick Test (CLQT) was administered to assess the relative status of five cognitive domains: attention, memory, language, executive functioning, and visuospatial skills. Scores from 10 tasks were used to estimate severity ratings (standardized for age groups 18-69 years and 70-89 years) for each domain, a clock drawing task, as well as an overall composite severity rating of cognition.       Task Score Criterion Cut Scores  Personal Facts 7/8 8  Symbol Cancellation 11/12 11  Confrontation Naming 10/10 10  Clock Drawing  11/13 12  Story Retelling 6/10 6  Symbol Trails 10/10 9  Generative Naming 3/9 5  Design Memory 6/6 5  Mazes  8/8 7  Design Generation 5/13 6     Cognitive Domain Composite Score Severity Rating  Attention 190/215 WNL  Memory 148/185 Mild  Executive Function 26/40 WNL  Language 26/37 Mild  Visuospatial Skills 95/105 WNL  Clock Drawing  11/13 Mild  Composite Severity Rating  WNL           PATIENT REPORTED OUTCOME MEASURES (PROM): Neuro QOL - Cognitive Short Form: 29                                                                                                                              TREATMENT DATE:   12/24/23: Pt was seen for skilled ST services targeting cognitive-communication. Pt reports he saw a friend and had a few of instances of anomia. He explained to friend how he had a stroke to reduce pressure on communication as suggested by SLP. Pt reported there were no instances where he was completely stuck and couldn't move forward. Pt reports he has not been using word finding strategies at home. SLP demonstrated use of Constant Therapy to continue working towards word finding, if pt feels he struggles to attempt word finding  strategies in conversation. Pt struggled to identify verbs and categories from pictures. SLP demonstrated how he could use "describe" to promote word finding. Pt reports finding this "difficult". SLP explained that it takes practice to implement word finding strategies successfully. To attempt to complete memory strategies next session   12/20/23: Pt was seen for skilled ST services targeting cognitive-communication. Pt reported he has not been using any word finding strategies; though, he said he has been describing something if he can't think of the word. SLP explained this is a word finding strategy. SLP initiated education on memory strategies this session. Pt reports he has been having some trouble with memory re: forgot things on his shopping list (forgot his cell phone at home with his list). We discussed "the rule of 3" - 3 objects before leaving the house (keys, wallet, phone). Pt reports he has been using "slow down" as his preferred dysarthria strategy. Pt was able to recall when he needed to be more cognizant of using these strategies. To continue with internal memory strategies next session.    12/12/23: Pt was seen for skilled ST services targeting word finding strategies. Pt participated in therapeutic exercise where he was asked to describe basic objects to SLP. SLP was able to deduce the object based off the description in 70% of exemplars. Pt  benefited from prompts re: "what does it do/ what is it used for" which allowed pt to narrow down description as needed. To begin memory strategies next session.   12/11/23: Pt was seen for skilled ST services targeting cognitive-communication. Pt reports he has been attempting word finding strategies at home - but feels like he should challenge himself to do it without using strategies. SLP explained the purpose of strategies is to improve ability to communicate thoughts/word more efficiently. Pt reports he gets frustrated that he is not able to do it as quickly as he would like. SLP completed education on attention strategies - pt reports "I am not really doing anything at home or around a bunch of people", and does not feel like he could benefit from these. To continue with word finding practice next session.   12/05/23: Pt was seen for skilled ST services targeting cognitive-communication. Pt reports he has not been trying to use word finding strategies at home. SLP to continue practice of word finding strategies to encourage transfer of strategies to home/community. SLP initiated education on attention strategies this session re: managing fatigue - spoon theory, managing distractions, brain breaks. To cont with strategy education next session.    12/03/23: Pt was seen for skilled ST services. Pt reports speech intelligibility has been good, but still struggling when feeling more tired. He reports he has been mindful of dysarthria strategies during these times re: pausing and increasing loudness. Pt feels word finding is the most difficult for him at this time and would like to focus on this. He reports he also has trouble understanding the TV. SLP suggested putting subtitles on the TV to see if this is helpful. Pt reported he had an instance where he could not think of the name "iris". Pt did not use a strategy in this instance. SLP suggested trying "look it up" and demonstrated how to complete this on  google. SLP facilitated use of "look it up" strategy with patient using iphone. Pt was able to generate adequate descriptions to find missing words for [employer] Day, his favorite pizza place. Pt participated in informal conversation with no instances of anomia.   11/28/23:  Pt was seen for skilled ST services targeting cognitive-communication. SLP edu on using microphone speech-to-text to make notes to support memory. Pt reports he did not do any writing since he was last here - but he has been thinking about it. SLP included writing in today's therapeutic task re: creation of dysarthria BE CLEAR sentences. Pt was able to generate and produce sentences with intelligible speech independently. Pt's speech continues to be 100% intelligible; however, pt reports this is not the case when he is fatigued. He understands this is when he should be most cognizant of using dysarthria strategies. Pt was tasked with writing 5, SLP-dictated, functional sentences. Pt required modA to spell words correctly. Pt was demonstrate use of speech to text to assist in error correction of sentences.   11/26/23: Pt was seen for skilled ST services targeting cognitive-communication. Pt reports he used "synonym" word finding strategy in an instance of anomia over the week. Pt reports he hasn't had any other significant difficulty with word finding. We reviewed "Be Clear" strategies for dysarthria re: speaking louder, over-articulating, slow down. SLP checked-in on pt's feelings regarding cognitive difficulties. He reports he hasn't noticed any significant problems; however, he realizes he has forgotten to bring his folder in x2. SLP suggested writing down tasks. Pt reported difficulty with writing from his previous strokes. SLP suggested using speech to text. Pt reported he has been leaving his cell phone at home. SLP encouraged him to always keep his cell nearby in case of emergencies, as well as, a memory tool. Pt attempted to write list of  tasks needed to complete. Spelling errors noted. SLP encouraged pt to spell the word in his head prior to writing it down vs dividing attention between trying to figure out how to spell the word while attempting to write. Pt required max cue reminders to first spell the word in his head before attempting to write. Pt was noted to reduce errors by ~50% using therapist strategy.   11/20/23: Pt was seen for skilled ST services targeting cognitive-communication and word finding strategies. Pt completed Cognitive PROMs measure - see above in blue. Pt reports he hasn't had any significant instances of anomia - given a 2-3 second delay, word typically pops out. Pt reports he is to go golfing (on the putting green) with a friend for his birthday. We discussed word finding strategies he could use in conversation. SLP facilitated word finding practice using multiple meaning words. Pt was tasked with finding the synonym (or a phrase close) for the given word. SLP challenged the patient to find synonyms to different golf terms in preparation for his outing. SLP briefly educated on "Be Clear" for dysarthria. To continue next session.   11/15/23: Pt was seen for skilled ST services targeting cognitive-communication and word finding strategies. SLP initiated education on word finding. SLP provided examples of each strategies and how to use in conversation. Pt reported he feels he would like to the following word finding strategies re: delay, describe, association, look it up. Pt was noted to have errors when reading aloud which was impacting his reading comprehension. Pt was noted to delete sounds from words  and add functor words while reading. SLP trialed re: pointing to each word with pen, re-reading aloud, re-reading silently to remove expressive component. To demonstrate "describe" strategy through Semantic Feature Analysis (SFA) next session.   11/09/23: Pt was seen for skilled ST services targeting completed  cognitive-communication testing and education. SLP reviewed CLQT results with patient. He had no  further questions. SLP provided pt with cognitive-communication handout. Pt reported difficulty with re: recalling details in conversations, understanding TV shows, difficulty with expressing thoughts, and reading/writing.  Pt has residual aphasia from previous stroke. To provide strategies in this POC.   11/07/23: Provided brief education on cognitive-communication disorder and speech intelligibility.   PATIENT EDUCATION: Education details: SLP role in cognitive-communication impairments Person educated: Patient Education method: Explanation Education comprehension: needs further education   GOALS: Goals reviewed with patient? No; to update after further evaluation  SHORT TERM GOALS: Target date: 12/08/23  Continue Cognitive testing to determine needs Baseline: Goal status: MET  2.  Complete PROMs measure Baseline:  Goal status: MET  3.  Pt will recall 3 word finding strategies to use in instances of anomia with independently. Baseline:  Goal status: MET  4.  Pt will verbalize 2 attention strategies to improve concentration during functional tasks.  Baseline:  Goal status:NOT MET  5. Pt will verbalize 2 memory strategies for recall of important information.  Baseline:  Goal status: NOT MET    LONG TERM GOALS: Target date: 01/08/24  Pt will improve score on PROMs measure Baseline:  Goal status: INITIAL  2.  Pt will report successful use of word finding strategies at home and in the community. Baseline:  Goal status: INITIAL  3.  Pt will report successful use of attention strategies at home and in the community. Baseline:  Goal status: INITIAL  4.  Pt will report successful use of memory strategies at home and in the community.  Baseline:  Goal status: INITIAL   ASSESSMENT:  CLINICAL IMPRESSION: Pt is a 68 yo male who presents to ST OP for evaluation post recent CVA in  Jan. Pt lives alone and will be driven to appointments by ex-wife or friends. Pt endorses speech changes, in addition to sx associated with cognitive-communication challenges [id number] of new information, word finding, and slowed processing.  Pt was assessed using CLQT - to complete next session. SLP observed difficulty with short term memory, instances of anomia, and very mild dysarthria. Pt was 100% intelligible in quiet, therapy room to an unfamiliar listener. He reports speech is worse when feeling tired. SLP to provide strategies for pt use in moments where speech may be unclear. Pt reports no difficulty with swallowing at this time and is tolerating a regular/thin diet. SLP rec skilled ST services to address cognitive-communication impairment and dysarthria to maximize functional independence and increase QOL.     OBJECTIVE IMPAIRMENTS: include memory, executive functioning, expressive language, and dysarthria. These impairments are limiting patient from effectively communicating at home and in community. Factors affecting potential to achieve goals and functional outcome are NA. Patient will benefit from skilled SLP services to address above impairments and improve overall function.  REHAB POTENTIAL: Good  PLAN:  SLP FREQUENCY: 1-2x/week  SLP DURATION: 8 weeks  PLANNED INTERVENTIONS: Environmental controls, Cueing hierachy, Internal/external aids, Functional tasks, SLP instruction and feedback, Compensatory strategies, Patient/family education, and [id number] Treatment of speech (30 or 45 min)     [employer], CCC-SLP 12/24/2023, 9:28 AM

## 2023-12-24 NOTE — Therapy (Signed)
 OUTPATIENT PHYSICAL THERAPY TREATMENT    Patient Name: Adrian [name] MRN: [id number] DOB:[birth year], 68 y.o., male Today's Date: 01/02/2024      END OF SESSION:                  Past Medical History:  Diagnosis Date   AF (paroxysmal atrial fibrillation) (HCC) 11/29/2018   Atrial flutter (HCC) 10/12/2015   a. TEE 3/17 with ? LAA clot-->s/p TEE/DCCV 11/24/2015; s/p DCCV 12/2022;s/p ablation 06/2023   CAD (coronary artery disease), native coronary artery 12/06/2015   a. 10/2015 MV: EF  37%, reversible defect inferior apex, intermediate risk findings; b. 10/2015 Cath: 20% mid RCA.   Carotid artery stenosis    1-39% right ICA stenosis and occluded left ICA   Colonic diverticular abscess    Diverticulitis    History of chemotherapy 2005   Cisplatin   Hypothyroidism    NICM (nonischemic cardiomyopathy) (HCC) 10/14/2015   a. Tachy mediated?;  b. Echo 3/17 - Mild concentric LVH, EF 30-35%, anteroseptal, anterior, anterolateral, apical anterior, lateral hypokinesis, trivial MR, mild to moderately reduced RVSF; c. LHC 3/17 - mRCA 20%   Radiation NOv.3,2005-Dec. 15, 2005   6810 cGy in 30 fractions   Tonsil cancer [facility]) 2005   Dr Ava Lei.  XRT   Past Surgical History:  Procedure Laterality Date   A-FLUTTER ABLATION N/A 06/19/2023   Procedure: A-FLUTTER ABLATION;  Surgeon: Boyce Byes, MD;  Location: [facility] INVASIVE CV LAB;  Service: Cardiovascular;  Laterality: N/A;   APPENDECTOMY N/A 03/07/2019   Procedure: ROBOTIC ASSISTED APPENDECTOMY;  Surgeon: Candyce Champagne, MD;  Location: [facility] ORS;  Service: General;  Laterality: N/A;   CARDIAC CATHETERIZATION N/A 10/18/2015   Procedure: Left Heart Cath and Coronary Angiography;  Surgeon: Odie Benne, MD;  Location: [facility] INVASIVE CV LAB;  Service: Cardiovascular;  Laterality: N/A;   CARDIOVERSION N/A 11/24/2015   Procedure: CARDIOVERSION;  Surgeon: Loyde Rule, MD;  Location: [facility] ENDOSCOPY;  Service: Cardiovascular;   Laterality: N/A;   CARDIOVERSION N/A 12/20/2022   Procedure: CARDIOVERSION;  Surgeon: Bridgette Campus, MD;  Location: [facility] INVASIVE CV LAB;  Service: Cardiovascular;  Laterality: N/A;   CYSTOSCOPY WITH STENT PLACEMENT Bilateral 03/07/2019   Procedure: CYSTOSCOPY WITH BILATERAL FIREFLY INJECTION;  Surgeon: Andrez Banker, MD;  Location: [facility] ORS;  Service: Urology;  Laterality: Bilateral;   GASTROSTOMY TUBE PLACEMENT  07/04/2004   IR - G tube for tonsilar cancer   IR ANGIO INTRA EXTRACRAN SEL COM CAROTID INNOMINATE UNI R MOD SED  03/09/2019   IR ANGIO VERTEBRAL SEL VERTEBRAL UNI R MOD SED  03/09/2019   IR CT HEAD LTD  03/09/2019   IR PERCUTANEOUS ART THROMBECTOMY/INFUSION INTRACRANIAL INC DIAG ANGIO  03/09/2019   IR RADIOLOGIST EVAL & MGMT  12/18/2018   LAMINECTOMY     C5/placement of steel plate   LOOP RECORDER INSERTION N/A 06/19/2023   Procedure: LOOP RECORDER INSERTION;  Surgeon: Boyce Byes, MD;  Location: [facility] INVASIVE CV LAB;  Service: Cardiovascular;  Laterality: N/A;   NECK SURGERY  2003   replaced disk   RADIOLOGY WITH ANESTHESIA N/A 03/08/2019   Procedure: IR WITH ANESTHESIA;  Surgeon: Luellen Sages, MD;  Location: [facility] OR;  Service: Radiology;  Laterality: N/A;   TEE WITHOUT CARDIOVERSION N/A 10/15/2015   Procedure: TRANSESOPHAGEAL ECHOCARDIOGRAM (TEE);  Surgeon: Darlis Eisenmenger, MD;  Location: [facility] ENDOSCOPY;  Service: Cardiovascular;  Laterality: N/A;   TEE WITHOUT CARDIOVERSION N/A 11/24/2015   Procedure: TRANSESOPHAGEAL ECHOCARDIOGRAM (TEE);  Surgeon: Donata Fryer  Odette Benjamin, MD;  Location: [facility] ENDOSCOPY;  Service: Cardiovascular;  Laterality: N/A;   TEE WITHOUT CARDIOVERSION N/A 12/20/2022   Procedure: TRANSESOPHAGEAL ECHOCARDIOGRAM;  Surgeon: Bridgette Campus, MD;  Location: [facility] INVASIVE CV LAB;  Service: Cardiovascular;  Laterality: N/A;   XI ROBOTIC ASSISTED COLOSTOMY TAKEDOWN N/A 03/07/2019   Procedure: XI ROBOTIC ASSISTED LOW ANTERIOR RESECTION, RIGID PROCTOSCOPY;  Surgeon: Candyce Champagne, MD;   Location: [facility] ORS;  Service: General;  Laterality: N/A;   Patient Active Problem List   Diagnosis Date Noted   Carotid occlusion, left 10/23/2023   ICH (intracerebral hemorrhage) (HCC) 08/11/2023   Carotid artery stenosis    Atrial flutter (HCC) 12/19/2022   Essential hypertension 04/22/2021   Hypokalemia 03/12/2019   Expressive aphasia 03/10/2019   Dysphagia 03/10/2019   Middle cerebral artery embolism, left 03/09/2019   Diverticular stricture (HCC) 03/07/2019   Bacteria in urine    Chronic atrial fibrillation (HCC) 12/02/2018   Current use of long term anticoagulation 12/02/2018   UTI (urinary tract infection) 11/29/2018   Hyponatremia 11/29/2018   PAF (paroxysmal atrial fibrillation) (HCC) 11/29/2018   Diverticulitis of large intestine with abscess 09/23/2018   Dyslipidemia 12/05/2017   CAD (coronary artery disease), native coronary artery 12/06/2015   SOB (shortness of breath)    NICM (nonischemic cardiomyopathy) (HCC)    Thyroid  activity decreased    Hypothyroidism 08/12/2013   History of radiation therapy 05/30/2012   Smokes tobacco daily 05/30/2012   Cancer of tonsillar fossa (HCC) 11/27/2011   Tonsil cancer (HCC) 2005   History of chemotherapy 2005    PCP: Glean Lamy MD   REFERRING PROVIDER: Genetta Kenning, MD  REFERRING DIAG: I61.0 (ICD-10-CM) - Nontraumatic subcortical hemorrhage of left cerebral hemisphere [facility])  THERAPY DIAG:  Muscle weakness (generalized)  Other abnormalities of gait and mobility  Other lack of coordination  Unsteadiness on feet  Difficulty in walking, not elsewhere classified  Rationale for Evaluation and Treatment: Rehabilitation  ONSET DATE: 08/11/23  SUBJECTIVE:   SUBJECTIVE STATEMENT:  Getting a little frustrated that recovery is taking awhile, wish it was faster. Walking down steps is getting a little easier, walking outside is still a little difficult too especially going down hill- worried about falling forward.    PERTINENT HISTORY: 1.  History of left basal ganglia hemorrhage approximately 2 and half months ago.  He has completed inpatient rehabilitation as well as home health.  I do think he would be a great candidate for outpatient therapy.  He has gotten back to modified independent level but still has some mild weakness on the right side as well as fine motor issues and motor control issues.  His balance is also off.  He continues wear right AFO because of foot and ankle weakness.  Patient has some short-term memory issues as well Will make referral for outpatient PT OT and speech therapy Physical medicine rehab follow-up in 3 months  PAIN:  Are you having pain? No  0/10 now   PRECAUTIONS: Fall and Other: R hemi, has loop recorder   RED FLAGS: None   WEIGHT BEARING RESTRICTIONS: No  FALLS:  Has patient fallen in last 6 months? No  LIVING ENVIRONMENT: Lives with: lives alone Lives in: House/apartment Stairs: 2 STE with rail  Has following equipment at home: Single point cane and Environmental consultant - 2 wheeled  OCCUPATION: retired- used to work for [employer]   PLOF: Independent, Independent with basic ADLs, Independent with gait, Independent with transfers, and Requires assistive device for independence  PATIENT GOALS: walk straight without a cane, get back to driving and motorcycling   NEXT MD VISIT: Referring 01/25/24  OBJECTIVE:  Note: Objective measures were completed at Evaluation unless otherwise noted.  DIAGNOSTIC FINDINGS:   CLINICAL DATA:  Stroke, follow up Neuro deficit, acute, stroke suspected   EXAM: MRI HEAD WITHOUT AND WITH CONTRAST   TECHNIQUE: Multiplanar, multiecho pulse sequences of the brain and surrounding structures were obtained without and with intravenous contrast.   CONTRAST:  6.5mL GADAVIST  GADOBUTROL  1 MMOL/ML IV SOLN   COMPARISON:  CT head from today.   FINDINGS: Brain: When comparing across modalities, no substantial change in intraparenchymal hemorrhage in  the left basal ganglia with intraventricular extension. No visible surrounding acute hemorrhage or mass lesion; however, acute blood products limits assessment. Prior left frontal infarct with encephalomalacia. No hydrocephalus. No pathologic enhancement.   Vascular: Major arterial flow voids are maintained at the skull base.   Skull and upper cervical spine: Normal marrow signal.   Sinuses/Orbits: Right frontal sinus opacification. Remaining sinuses are clear. No acute orbital findings.   Other: No mastoid effusions.   IMPRESSION: 1. When comparing across modalities, no substantial change in intraparenchymal hemorrhage in the left basal ganglia with intraventricular extension. No progressive mass effect. 2. No visible surrounding acute hemorrhage or mass lesion; however, acute blood products limits assessment  EXAM: CT HEAD WITHOUT CONTRAST   TECHNIQUE: Contiguous axial images were obtained from the base of the skull through the vertex without intravenous contrast.   RADIATION DOSE REDUCTION: This exam was performed according to the departmental dose-optimization program which includes automated exposure control, adjustment of the mA and/or kV according to patient size and/or use of iterative reconstruction technique.   COMPARISON:  Head CT 08/12/2023   FINDINGS: Brain: Interval decrease in size of the previously seen hemorrhage in the left basal ganglia, now measuring 10 x 7 mm, previously 20 x 12 mm. There is resolution of intraventricular blood products. No hydrocephalus. No extra-axial fluid collection. No mass effect. No mass lesion. No CT evidence of an acute cortical infarct. There is chronic left MCA territory infarct involving the left opercular region.   Vascular: No hyperdense vessel or unexpected calcification.   Skull: Normal. Negative for fracture or focal lesion.   Sinuses/Orbits: No middle ear or mastoid effusion. Paranasal sinuses are notable for  complete opacification of the right frontal sinus. Orbits are unremarkable.   Other: None.   IMPRESSION: No acute intracranial abnormality. Interval decrease in size of left basal ganglia hemorrhage, now measuring 10 x 7 mm, previously 20 x 12 mm. Resolution of intraventricular blood products. No hydrocephalus.    PATIENT SURVEYS:    Patient-Specific Activity Scoring Scheme  "0" represents "unable to perform." "10" represents "able to perform at prior level. 0 1 2 3 4 5 6 7 8 9  10 (Date and Score)   Activity Eval  12/03/23  12/24/23  1. Walking straight without the cane  4   6 6-7  2. Strength in RLE   4 6  5   3. Coordinated movements of R LE  3 6 3   4.     5.     Score 3.7 6 4.6   Total score = sum of the activity scores/number of activities Minimum detectable change (90%CI) for average score = 2 points Minimum detectable change (90%CI) for single activity score = 3 points     COGNITION: Overall cognitive status: Within functional limits for tasks assessed     SENSATION: Not  tested      LOWER EXTREMITY MMT:  MMT Right eval Left eval 12/24/23:  R  Hip flexion 4 5 4+  Hip extension     Hip abduction     Hip adduction     Hip internal rotation     Hip external rotation     Knee flexion 4 5 4+  Knee extension 4 5 4+  Ankle dorsiflexion 1 at best (in AFO, reports no movement in ankle)  2+, note marked inversion  Ankle plantarflexion     Ankle inversion     Ankle eversion      (Blank rows = not tested)    FUNCTIONAL TESTS:  5 times sit to stand: 12.8 seconds some off shift from R LE  Timed up and go (TUG): deferred  3 minute walk test: 261ft [facility]     11/28/23 0001  Dynamic Gait Index  Level Surface 2  Change in Gait Speed 2  Gait with Horizontal Head Turns 2  Gait with Vertical Head Turns 2  Gait and Pivot Turn 2  Step Over Obstacle 2  Step Around Obstacles 2  Steps 1  Total Score 15     GAIT: Distance walked: 255ft  Assistive device  utilized: Single point cane Level of assistance: Modified independence Comments: wide BOS, increased tone in R LE with gait pattern- limited knee ROM and ankle tends to invert a bit , mild unsteadiness with corners, limited rotation at trunk/hips                                                                                                                                 TREATMENT DATE:  12/24/23: Reassessment:  Gait outdoors with st cane, down up curb x 2, on inclined asphalt, over 800'.   DGI 15/24 Forward heel taps L with R foot on 4" step, emphasis on eccentric training R quads Standing semi tandem, L foot on 2" step for horizontal and vertical head turns, 15 reps each to engage med/lat R ankle stability 15x Standing forward taps L foot onto 10' height to isolate weight bearing R LE 15 x  12/20/23  Nustep seat 7 L6x8 minutes all four extremities  STS goblet squat with eccentric lower 7# x12  STS with 4 inch box under L foot/7# in R UE cues for even wt and good eccentric  Forward heel taps for eccentric control/strength R LE 4 inch step x10   Hill and curb training outside with spc, S and intermittent cues for sequencing but improving           12/12/23  Side step over blue air pad (R) + forward step over mini air pads + side step over blue air pad (L) + side step over blue air pad (R) + forward step over mini air pads + side step over blue air pad (R) x3 rounds  Lateral toe taps alternating to small air targets + forward  step up then step down on 4 inch box + alternating toe taps on bosu + step up then step down 4 inch box- difficulty maintaining good alignment with step down (1 round)   Nustep L6x8 minutes all four extremities  Gait outside over curbs and hills  with hurricane, general S and occasional cues for sequencing         12/11/23  Nustep L6x8 minutes all four extremities seat 7   One foot on BOSU/other on ground 3x30 seconds B PWR step laterally + reach  to target at ground level on R x5 (standing) PWR step forward + mini squat to [facility] press to mini squat + backwards PWR step x5 Cross midline reach/wt shift to RLE + STS with multimodal cues to maintain wt on RLE with transitions x8  Gait and curb training outside- slopes and hills on even and uneven surfaces, occasional cues for sequencing with curb navigation. Difficulty with eccentric quad work on hemi side  when going down hill     12/05/23  Nustep L6 x8 minutes all four extremities seat 7  Hip hikes x10 B min-mod cues for form  Forward step ups 6 inch step x12 B intermittent UE support  Gait training 226ft 1.5# R ankle for improved proprioception, cues for heel toe pattern especially push off with toe when about to enter swing phase  Backwards walking with 1.5# cues for improved step length L LE to promote increased stance time hemi LE    3 way taps off blue foam pad x10 B, difficulty due to weak hip ABD groups hemi LE  Side steps + forward/backward steps over blue foam pad x2 laps            12/03/23  Nustep L6x8 minutes all four extremities for w/u, promotion of reciprocal movement  Forward step ups 6 inch step R LE intermittent UE support for mm endurance and WB tone management   One foot on BOSU/other on solid surface 3x30 seconds B- cues to not lock out R knee Forward lunges onto BOSU x10 B- cues to not lock out R knee in stance, and to avoid R LE circumduction when lifting to top of BOSU Lateral lunges onto BOSU x10 B- cues to not lock out R knee in stance, and to avoid RLE circumduction with movement to top of ball  Gait with 1.5# R LE for improved proprioception x177ft x2- first lap with hurricane, second without. Noted increased LE circumduction without AD    11/28/23   Side step, step over forward and backward on blue foam pad in // bars  DGI re-test  Tandem walk with lateral toe tap each step, alternating blue foam pad // bars     STS with yellow ball  in goblet hold x10, cues to avoid offshift from hemi side- placed R foot back/L foot forward for majority of reps  Nustep L6x8 minutes all four extremities for reciprocal movement and functional activity tolerance  Heel toe steps on blue air pad for assist with rocking motion x10 B, cues to normalize heel-toe and swing through, followed by gait training with focus on heel-toe pattern and keeping hips level            PATIENT EDUCATION:  Education details: exam findings, POC, HEP, otherwise as above  Person educated: Patient Education method: Explanation, Demonstration, and Handouts Education comprehension: verbalized understanding, returned demonstration, and needs further education  HOME EXERCISE PROGRAM:  Access Code: [email or web address] URL: [email or web address] Date: 11/20/2023 Prepared by: Starling Eck  Allena Ard  Exercises - Heel Toe Raises with Counter Support  - 1 x daily - 7 x weekly - 2 sets - 10 reps - Stride Stance Weight Shift  - 1 x daily - 7 x weekly - 2 sets - 10 reps - Single Leg Balance with Clock Reach  - 1 x daily - 7 x weekly - 2 sets - 10 reps - Sit to Stand with Resistance Around Legs  - 1 x daily - 7 x weekly - 2 sets - 10 reps - Forward Step Over with Counter Support  - 1 x daily - 7 x weekly - 2 sets - 10 reps - Backward Step Over with Counter Support  - 1 x daily - 7 x weekly - 2 sets - 10 reps   ASSESSMENT:  CLINICAL IMPRESSION:            Pt arrives today with ongoing frustration on his slow process with recovery of function. Reassessed all of his goals, had some decline with the PSFS , DGI was informative in that his deficits occurred with faster movements and with turns.  Noted he really inverts R ankle at time,especially with increased effort and fast movements.  Noted better ability to isolate R ankle dorsiflexion and increased strength, also increased tone with tendency to invert R ankle.  This inversion tends to make his R ankle want to roll over  with standing, gait.  We discussed possibly adapting R ankle AFO with more med/lat support, also possibly discuss with his MD at next appt what the criteria are for Botox.  Determined to extend his POC for one more month primarily to engage, work on control of R ankle in closed chain positions, to decrease the inversion. He agreed, if no change at end of month he will discuss options regarding AFO adaptations.   From eval: Patient is a 68 y.o. M who was seen today for physical therapy evaluation and treatment for I61.0 (ICD-10-CM) - Nontraumatic subcortical hemorrhage of left cerebral hemisphere [facility]). He is very motivated and moving a very high level given the hemorrhagic CVA. His main concerns are balance/coordination and getting back to walking without assistive device. Anticipate he will do very well with skilled PT services.   OBJECTIVE IMPAIRMENTS: Abnormal gait, decreased activity tolerance, decreased balance, decreased coordination, decreased knowledge of use of DME, decreased mobility, difficulty walking, decreased strength, and decreased safety awareness.   ACTIVITY LIMITATIONS: standing, squatting, stairs, transfers, and locomotion level  PARTICIPATION LIMITATIONS: driving, shopping, community activity, and yard work  PERSONAL FACTORS: Age, Behavior pattern, Fitness, Past/current experiences, Social background, Time since onset of injury/illness/exacerbation, and Transportation are also affecting patient's functional outcome.   REHAB POTENTIAL: Good  CLINICAL DECISION MAKING: Evolving/moderate complexity  EVALUATION COMPLEXITY: Moderate   GOALS: Goals reviewed with patient? No  SHORT TERM GOALS: Target date: 11/28/2023   Will be compliant with appropriate progressive HEP  Baseline: Goal status: MET 11/28/23  2.  Will score 14/24 on DGI  Baseline: 10 Goal status: MET 11/28/23 15/24  3.  Will be able to perform all functional transfers without compensation strategies or offshift  from hemi LE  Baseline:  Goal status: ONGOING 11/28/23- progressing well but still present   4.  Will be able to ambulate household distances without AD, Mod(I) Baseline:  Goal status: ONGOING 11/28/23 still using cane 90% of the time     LONG TERM GOALS: Target date: 12/26/2023 extend to 02/27/24    MMT in R LE to be at  least 4+/5 in hip and knee musculature  Baseline:  Goal status: 12/24/23: R hip and knee strength grossly 4/5 , R ankle dorsiflexion 2+/5  2.  Will score 18/24 on DGI to show improved functional balance  Baseline:  Goal status: 12/24/23: 15/24  3.  Will be able to walk at least 23ft over uneven/unsteady surfaces without device and no more than S/ 5110ft over even surfaces no device Mod(I) Baseline:  Goal status: 12/24/23: to walk even terrain without device  , needs cane for curbs, uneven, over 700' mod I with cane  4.  Will be able to ascend and descend steps without rail and minimal compensation strategies  Baseline:  Goal status: 12/24/23: using rail  5.  Will be compliant with advanced HEP vs gym based program at DC to maintain functional gains  Baseline:  Goal status: 12/24/23: has a [employer] membership and has not utlized yet  6.  PSFS to improve by at least 3 points  Baseline: 3.7 Goal status: ONGOING 12/03/23 6 12/24/23: ongoing 4.7   PLAN:  PT FREQUENCY: 2x/week  PT DURATION: 8 weeks  PLANNED INTERVENTIONS: [id number]- PT Re-evaluation, 97110-Therapeutic exercises, 97530- Therapeutic activity, 97112- Neuromuscular re-education, 97535- Self Care, [id number]- Manual therapy, [id number]- Gait training, [phone]- Orthotic Fit/training, [phone]- Aquatic Therapy, Stair training, Taping, Dry Needling, Cryotherapy, and Moist heat  PLAN FOR NEXT SESSION: objectives for recert, continue if he is making ongoing progress with skilled PT   Violeta Lecount, PT, DPT, OCS 01/02/24 12:07 PM

## 2023-12-25 ENCOUNTER — Encounter: Payer: Self-pay | Admitting: Occupational Therapy

## 2023-12-26 ENCOUNTER — Encounter: Payer: Self-pay | Admitting: Speech Pathology

## 2023-12-26 ENCOUNTER — Other Ambulatory Visit: Payer: Self-pay

## 2023-12-26 ENCOUNTER — Ambulatory Visit: Admitting: Speech Pathology

## 2023-12-26 ENCOUNTER — Encounter: Payer: Self-pay | Admitting: Occupational Therapy

## 2023-12-26 ENCOUNTER — Ambulatory Visit: Admitting: Occupational Therapy

## 2023-12-26 DIAGNOSIS — R4701 Aphasia: Secondary | ICD-10-CM

## 2023-12-26 DIAGNOSIS — R41841 Cognitive communication deficit: Secondary | ICD-10-CM

## 2023-12-26 DIAGNOSIS — R41840 Attention and concentration deficit: Secondary | ICD-10-CM

## 2023-12-26 DIAGNOSIS — R2681 Unsteadiness on feet: Secondary | ICD-10-CM

## 2023-12-26 DIAGNOSIS — M6281 Muscle weakness (generalized): Secondary | ICD-10-CM

## 2023-12-26 DIAGNOSIS — R41844 Frontal lobe and executive function deficit: Secondary | ICD-10-CM

## 2023-12-26 DIAGNOSIS — R2689 Other abnormalities of gait and mobility: Secondary | ICD-10-CM

## 2023-12-26 DIAGNOSIS — R278 Other lack of coordination: Secondary | ICD-10-CM

## 2023-12-26 NOTE — Therapy (Signed)
 OUTPATIENT OCCUPATIONAL THERAPY NEURO TREATMENT  Patient Name: Adrian [name] MRN: [id number] DOB:1955-11-22, 68 y.o., male Today's Date: 12/26/2023  PCP: Dr. Darren Em REFERRING PROVIDER: Dr. Sharl Davies  END OF SESSION:  OT End of Session - 12/26/23 0848     Visit Number 15    Number of Visits 25    Date for OT Re-Evaluation 01/23/24    Authorization Type Medicare    Authorization - Visit Number 15    Progress Note Due on Visit 20    OT Start Time 0845    OT Stop Time 0925    OT Time Calculation (min) 40 min    Activity Tolerance Patient tolerated treatment well    Behavior During Therapy [facility] for tasks assessed/performed                      Past Medical History:  Diagnosis Date   AF (paroxysmal atrial fibrillation) (HCC) 11/29/2018   Atrial flutter (HCC) 10/12/2015   a. TEE 3/17 with ? LAA clot-->s/p TEE/DCCV 11/24/2015; s/p DCCV 12/2022;s/p ablation 06/2023   CAD (coronary artery disease), native coronary artery 12/06/2015   a. 10/2015 MV: EF  37%, reversible defect inferior apex, intermediate risk findings; b. 10/2015 Cath: 20% mid RCA.   Carotid artery stenosis    1-39% right ICA stenosis and occluded left ICA   Colonic diverticular abscess    Diverticulitis    History of chemotherapy 2005   Cisplatin   Hypothyroidism    NICM (nonischemic cardiomyopathy) (HCC) 10/14/2015   a. Tachy mediated?;  b. Echo 3/17 - Mild concentric LVH, EF 30-35%, anteroseptal, anterior, anterolateral, apical anterior, lateral hypokinesis, trivial MR, mild to moderately reduced RVSF; c. LHC 3/17 - mRCA 20%   Radiation NOv.3,2005-Dec. 15, 2005   6810 cGy in 30 fractions   Tonsil cancer [facility]) 2005   Dr Ava Lei.  XRT   Past Surgical History:  Procedure Laterality Date   A-FLUTTER ABLATION N/A 06/19/2023   Procedure: A-FLUTTER ABLATION;  Surgeon: Boyce Byes, MD;  Location: [facility] INVASIVE CV LAB;  Service: Cardiovascular;  Laterality: N/A;   APPENDECTOMY N/A 03/07/2019    Procedure: ROBOTIC ASSISTED APPENDECTOMY;  Surgeon: Candyce Champagne, MD;  Location: [facility] ORS;  Service: General;  Laterality: N/A;   CARDIAC CATHETERIZATION N/A 10/18/2015   Procedure: Left Heart Cath and Coronary Angiography;  Surgeon: Odie Benne, MD;  Location: [facility] INVASIVE CV LAB;  Service: Cardiovascular;  Laterality: N/A;   CARDIOVERSION N/A 11/24/2015   Procedure: CARDIOVERSION;  Surgeon: Loyde Rule, MD;  Location: [facility] ENDOSCOPY;  Service: Cardiovascular;  Laterality: N/A;   CARDIOVERSION N/A 12/20/2022   Procedure: CARDIOVERSION;  Surgeon: Bridgette Campus, MD;  Location: [facility] INVASIVE CV LAB;  Service: Cardiovascular;  Laterality: N/A;   CYSTOSCOPY WITH STENT PLACEMENT Bilateral 03/07/2019   Procedure: CYSTOSCOPY WITH BILATERAL FIREFLY INJECTION;  Surgeon: Andrez Banker, MD;  Location: [facility] ORS;  Service: Urology;  Laterality: Bilateral;   GASTROSTOMY TUBE PLACEMENT  07/04/2004   IR - G tube for tonsilar cancer   IR ANGIO INTRA EXTRACRAN SEL COM CAROTID INNOMINATE UNI R MOD SED  03/09/2019   IR ANGIO VERTEBRAL SEL VERTEBRAL UNI R MOD SED  03/09/2019   IR CT HEAD LTD  03/09/2019   IR PERCUTANEOUS ART THROMBECTOMY/INFUSION INTRACRANIAL INC DIAG ANGIO  03/09/2019   IR RADIOLOGIST EVAL & MGMT  12/18/2018   LAMINECTOMY     C5/placement of steel plate   LOOP RECORDER INSERTION N/A 06/19/2023   Procedure: LOOP RECORDER  INSERTION;  Surgeon: Boyce Byes, MD;  Location: [facility] INVASIVE CV LAB;  Service: Cardiovascular;  Laterality: N/A;   NECK SURGERY  2003   replaced disk   RADIOLOGY WITH ANESTHESIA N/A 03/08/2019   Procedure: IR WITH ANESTHESIA;  Surgeon: Luellen Sages, MD;  Location: [facility] OR;  Service: Radiology;  Laterality: N/A;   TEE WITHOUT CARDIOVERSION N/A 10/15/2015   Procedure: TRANSESOPHAGEAL ECHOCARDIOGRAM (TEE);  Surgeon: Darlis Eisenmenger, MD;  Location: [facility] ENDOSCOPY;  Service: Cardiovascular;  Laterality: N/A;   TEE WITHOUT CARDIOVERSION N/A 11/24/2015   Procedure:  TRANSESOPHAGEAL ECHOCARDIOGRAM (TEE);  Surgeon: Loyde Rule, MD;  Location: [facility] ENDOSCOPY;  Service: Cardiovascular;  Laterality: N/A;   TEE WITHOUT CARDIOVERSION N/A 12/20/2022   Procedure: TRANSESOPHAGEAL ECHOCARDIOGRAM;  Surgeon: Bridgette Campus, MD;  Location: [facility] INVASIVE CV LAB;  Service: Cardiovascular;  Laterality: N/A;   XI ROBOTIC ASSISTED COLOSTOMY TAKEDOWN N/A 03/07/2019   Procedure: XI ROBOTIC ASSISTED LOW ANTERIOR RESECTION, RIGID PROCTOSCOPY;  Surgeon: Candyce Champagne, MD;  Location: [facility] ORS;  Service: General;  Laterality: N/A;   Patient Active Problem List   Diagnosis Date Noted   Carotid occlusion, left 10/23/2023   ICH (intracerebral hemorrhage) (HCC) 08/11/2023   Carotid artery stenosis    Atrial flutter (HCC) 12/19/2022   Essential hypertension 04/22/2021   Hypokalemia 03/12/2019   Expressive aphasia 03/10/2019   Dysphagia 03/10/2019   Middle cerebral artery embolism, left 03/09/2019   Diverticular stricture (HCC) 03/07/2019   Bacteria in urine    Chronic atrial fibrillation (HCC) 12/02/2018   Current use of long term anticoagulation 12/02/2018   UTI (urinary tract infection) 11/29/2018   Hyponatremia 11/29/2018   PAF (paroxysmal atrial fibrillation) (HCC) 11/29/2018   Diverticulitis of large intestine with abscess 09/23/2018   Dyslipidemia 12/05/2017   CAD (coronary artery disease), native coronary artery 12/06/2015   SOB (shortness of breath)    NICM (nonischemic cardiomyopathy) (HCC)    Thyroid  activity decreased    Hypothyroidism 08/12/2013   History of radiation therapy 05/30/2012   Smokes tobacco daily 05/30/2012   Cancer of tonsillar fossa (HCC) 11/27/2011   Tonsil cancer (HCC) 2005   History of chemotherapy 2005    ONSET DATE: 08/11/23  REFERRING DIAG: I61.0 (ICD-10-CM) - Nontraumatic subcortical hemorrhage of left cerebral hemisphere (HCC)   THERAPY DIAG:  Muscle weakness (generalized)  Other abnormalities of gait and mobility  Other lack of  coordination  Frontal lobe and executive function deficit  Attention and concentration deficit  Unsteadiness on feet  Rationale for Evaluation and Treatment: Rehabilitation  SUBJECTIVE:   SUBJECTIVE STATEMENT: Pt denies pain  Pt accompanied by: self  PERTINENT HISTORY:   68 yo male hospitalized 08/11/23 with R sided weakness. CT showed a large basal ganglia ICH with IVH extension. PMH includes:  A-fib, carotid artery stenosis, CAD, tonsil cancer, hypothyroidism, L MCA CVA.    PRECAUTIONS: Fall  WEIGHT BEARING RESTRICTIONS: No  PAIN:  Are you having pain? No  FALLS: Has patient fallen in last 6 months? No  LIVING ENVIRONMENT: Lives with: lives alone has friends and ex wife checks in on him Lives in: House/apartment Stairs: no Has following equipment at home: Single point cane and Tour manager  PLOF: Independent  PATIENT GOALS: improve use of RUE  OBJECTIVE:  Note: Objective measures were completed at Evaluation unless otherwise noted.  HAND DOMINANCE: Right  ADLs: Overall ADLs: mod I with all basic ADLS Transfers/ambulation related to ADLs: Eating: uses RUE 20%, currently using non dominant LUE,  Grooming: using non  dominant LUE UB Dressing: mod I difficulty with buttons LB Dressing: mod I Toileting: mod I with cane Bathing: has tub bench Tub Shower transfers: mod I  Equipment: Transfer tub bench  IADLs: Shopping: needs assist for transportation Light housekeeping: Pt is performing light cleaning, he has occasional assist Meal Prep: microwave meals primarily, does a little stove top Medication management: pt handles  Financial management: pt handles Handwriting: 90% legibility, handwriting is small   MOBILITY STATUS: mod I with cane  ACTIVITY TOLERANCE: Activity tolerance: standing  UPPER EXTREMITY ROM:  grossly WFLS  UPPER EXTREMITY MMT:     MMT Right eval Left eval  Shoulder flexion 3+/5 4+/5  Shoulder abduction    Shoulder adduction     Shoulder extension    Shoulder internal rotation    Shoulder external rotation    Middle trapezius    Lower trapezius    Elbow flexion 4-/5 4-/5  Elbow extension 4-/5 4-/5  Wrist flexion    Wrist extension    Wrist ulnar deviation    Wrist radial deviation    Wrist pronation    Wrist supination    (Blank rows = not tested)  HAND FUNCTION: Grip strength: Right: 50 lbs; Left: 63 lbs  COORDINATION: 9 Hole Peg test: Right: 59.69 sec; Left: 29.35 sec  SENSATION:diminished in RUE  COGNITION: Overall cognitive status:unable to spell WORLD backwards correctly, recalls 2/3 words following a delay.  VISION ASSESSMENT: Not tested- denies visual changes   OBSERVATIONS: Pleasant gentleman motivated to improve                                                                                                                              TREATMENT DATE: 12/26/23-Closed chain shoulder flexion, then diagonals each direction in seated, min v.c, focus on control. Standing to review yellow theraband exercises for shoulder extension and flexion, min v.c for RUE 15 reps each Pt performed home management activity to sweep items and dispose of them with supervision, min v.c for wide stance  Pt practiced picking items off of the floor with wide stance, and recommendation that pt holds onto counter, table or other stable surface when picking things up for safety. Handwriting activity, with pt demonstrating ability to write a short paragraph with 100% legibility and good letter size. Grooved pegs with RUE for increased fine motor coordination, min difficulty and min v.c for performance and positioning.   12/24/23 Discussed potential d/c next week and started checking goals. Pt in agreement. see goals below.  Reviewed previously issued red theraband exercises for biceps, triceps and shoulder horizontal abduction 10-15 reps each. Therapist added low range yellow theraband exercises for shoulder flexion and  extension, min v.c and demonstration for strengthening bilateral UE's.  12/20/23-Pt drove himself to therapy today despite prior recommendation that pt discusses with MD and seeks MD clearance. Pt provided with driving eval contacts and therapist recommends that pt performs graduated driving program with someone if MD clears him.  Therapist discussed concerns regarding reaction time as pt has cognitive changes as well as RUE weakness/ decreased control which may impact reaction time. Pt verbalized understanding Alternating attention tasks on constant therapy in prep for driving: Alternating symbols: levels 4, 6, 8 with 100% accuracy. closed chain shoulder flexion, then diagonals each direction for increased RUE control, min v.c for positioning. Pt ambulated 150 ft while carrying a simulated plate of food in right hand without drops.    12/12/23 Seated closed chain shoulder flexion, holding ball betwen palms then diagonals both directions, min v.c and facilitation for control. Red theraband exercises shoulder horizontal abduction, biceps curls, and ticeps extension, bilateral UE's for increased strength 10 reps each, min v.c  Dynamic functional reaching in standing to retrieve items from lower surface and place in low and middle shelves, supervision, min v.c, no LOB. Stacking blocks/ unstacking blocks with LUE for increased fine motor coordination and RUE control, min difficulty, v.c  Gripper set at level 3 to pick up 1/3 of blocks with RUE, min difficulty/ v.c Writing activities with foam grip initally, printing alphabet with v.c to print in capitals to meet line, mi v.c Pt with improved legibility and letter size following practice, with min v.c Pt required min assist for spelling. End of session pt was able to complete a sentence using regular pen with good legibility and letter size.  12/11/23-Seated closed chain shoulder flexion, then diagonals both directions, min v.c and facilitation for  control. Quadraped activities on mat for core stability and UE strength, cat and cow, then liftin alternate UE, then lifting alternate LE, ball added under abdomen for increased stability, min-mod facilitation and v.c 10 reps each Fine motor coordination task placing grooved pegs then removing with in hand manipulation, min difficulty/ v.c Gripper set at level 2 sustained grip to pick up 1 inch blocks with RUE, min v.c Standing to perfrom functional reaching to place graded clothespins, 1-8# on targets with RUE, while retrieving from lower surface, supervision and min v.c UBE x 6 mins level 3 for conditioning.   12/05/23-Fine motor coordination task to put various sized pegs into pegboard, min v.c to avoid compensatory shoulder hike, removing pegs with in hand manipulation min difficulty/ v.c, Pt with improved dexterity today. Reveiwed red theraband HEP, ( shoulder horizontal abduction, biceps curls, triceps extension)10-15 reps each, min v.c for positioning). Pt performed shoulder extension  10 reps bilateral UE's in standing with red band however pt requires mod v.c for positioning, so this was not issued for home. Therapist checked short term goals for PN.  12/03/23 Standing to copy small peg design on vertical surface, mod difficulty/ drops and min v.c, increased time required, min errors for design, min v.c for in hand manipulation Ambulating 125 ft while carrying plate in right hand while using cane in left min v.c no drops. Closed chain shoulder flexion, then digaonals each direction with left and right UE's, min v.c for positioning      PATIENT EDUCATION: Education details:see above  Education method: Explanation, Demonstration, Tactile cues, Verbal cues, Education comprehension: verbalized understanding, returned demonstration, and verbal cues required  HOME EXERCISE PROGRAM: beginning coordination 3/26 11/09/23  Cane HEP 4/10- biceps triceps with yellow band, shoulder flexion with  paper towel roll  11/27/23- memory compensations  12/24/23- red theraband for biceps, triceps and horizonatl abduction, yellow t-band for shoulder flexion/ extension GOALS: Goals reviewed with patient? Yes  SHORT TERM GOALS: Target date: 12/01/23  I with inital HEP Goal status: met for cane, beginning  theraband 12/05/23  2.  Pt will report feeding himself at least 50% of the time with RUE. Baseline: uses 20% x Goal status:  met, per pt report 78% 11/28/23  3.  Pt will demonstrate improved RUE fine motor coordination for ADLs as evidenced by decreasing 9 hole peg test score to 52 secs or less Goal status: met , 43.26 secs  4.  Pt will report consistently brushing his teeth with RUE Goal status: partially met performs at least 90% of the time 12/24/23  5.  I with memory compensation strategies Goal status: met, issued 11/28/23  6.  Pt will report that he has resumed moderate level home management mod I Goal status:  met-cooking, Pt reports washing dishes, and performing laundry, sweeping   LONG TERM GOALS: Target date: 01/23/24  I with updated HEP Goal status: ongoing, 12/12/23  2.  Pt will demonstrate improved RUE fine motor coordination for ADLs as evidenced by decreasing 9 hole peg test score to 48 secs or less Goal status: met, 42.57 secs 12/24/23  3.  Pt will demonstrate ability to carry a plate in RUE without spills. Goal status:met 12/20/23  4.  Pt will write a short paragraph with 100% legibility and minimal decrease in letter size. Goal status: met, pt demonstrates ability to perform using pen with foam grip. 12/26/23. 5.  Pt will resume use of RUE as dominant hand at least 75% of the time for ADLs/ IADLs. Goal status:met, 80% 12/24/23  6. Pt will retrieve a 3 lbs object from eye level shelf using RUE with good control  Goal status:met, pt performed x 5 reps 12/24/23  ASSESSMENT:  CLINICAL IMPRESSION: Pt is progressing towards goals. He is now perfroming mod complex home  management activities mod I. He demonstrates improved RUE strength and control. PERFORMANCE DEFICITS: in functional skills including ADLs, IADLs, coordination, dexterity, sensation, strength, flexibility, Fine motor control, Gross motor control, mobility, balance, endurance, decreased knowledge of precautions, decreased knowledge of use of DME, and UE functional use, cognitive skills including attention, memory, problem solving, safety awareness, and thought, and psychosocial skills including coping strategies, environmental adaptation, habits, interpersonal interactions, and routines and behaviors.   IMPAIRMENTS: are limiting patient from ADLs, IADLs, play, leisure, and social participation.   CO-MORBIDITIES: may have co-morbidities  that affects occupational performance. Patient will benefit from skilled OT to address above impairments and improve overall function.  MODIFICATION OR ASSISTANCE TO COMPLETE EVALUATION: No modification of tasks or assist necessary to complete an evaluation.  OT OCCUPATIONAL PROFILE AND HISTORY: Detailed assessment: Review of records and additional review of physical, cognitive, psychosocial history related to current functional performance.  CLINICAL DECISION MAKING: LOW - limited treatment options, no task modification necessary  REHAB POTENTIAL: Good  EVALUATION COMPLEXITY: Low    PLAN:  OT FREQUENCY: 2x/week  OT DURATION: 12 weeks  PLANNED INTERVENTIONS: 97168 OT Re-evaluation, 97535 self care/ADL training, 10175 therapeutic exercise, 97530 therapeutic activity, 97112 neuromuscular re-education, 97140 manual therapy, 97113 aquatic therapy, 97035 ultrasound, 97018 paraffin, 10258 moist heat, 97010 cryotherapy, 97034 contrast bath, 97014 electrical stimulation unattended, 97129 Cognitive training (first 15 min), 52778 Cognitive training(each additional 15 min), 24235 Orthotics management and training, 36144 Splinting (initial encounter), passive range of  motion, balance training, functional mobility training, energy conservation, coping strategies training, patient/family education, and DME and/or AE instructions  RECOMMENDED OTHER SERVICES: n/a  CONSULTED AND AGREED WITH PLAN OF CARE: Patient  PLAN FOR NEXT SESSION:  check remaining goals, anticipate d/c next week.   Faythe Heitzenrater,  OTR/L 12/26/2023, 8:49 AM

## 2023-12-26 NOTE — Therapy (Addendum)
 OUTPATIENT SPEECH LANGUAGE PATHOLOGY TREATMENT & DISCHARGE SUMMARY   Patient Name: Adrian [name] MRN: [id number] DOB:Jun 19, 1956, 68 y.o., male Today's Date: 12/26/2023  PCP: NA REFERRING PROVIDER: Genetta Kenning, MD  SPEECH THERAPY DISCHARGE SUMMARY  Visits from Start of Care: 14  Current functional level related to goals / functional outcomes: Pt has made limited progress towards goals due to limited home practice and the nature of impairment (aphasia >3 years). SLP has encouraged pt to attempt practice of strategies at home and return if he feels like he needs more directed practice.    Remaining deficits: Aphasia, mild cog (memory, attention)   Education / Equipment: completed   Patient agrees to discharge. Patient goals were partially met. Patient is being discharged due to being pleased with the current functional level., pt is to initiate practice of strategies at home.      END OF SESSION:  End of Session - 12/26/23 0931     Visit Number 14    Number of Visits 17    Date for SLP Re-Evaluation 01/07/24    SLP Start Time 0930    SLP Stop Time  1010    SLP Time Calculation (min) 40 min    Activity Tolerance Patient tolerated treatment well             Past Medical History:  Diagnosis Date   AF (paroxysmal atrial fibrillation) (HCC) 11/29/2018   Atrial flutter (HCC) 10/12/2015   a. TEE 3/17 with ? LAA clot-->s/p TEE/DCCV 11/24/2015; s/p DCCV 12/2022;s/p ablation 06/2023   CAD (coronary artery disease), native coronary artery 12/06/2015   a. 10/2015 MV: EF  37%, reversible defect inferior apex, intermediate risk findings; b. 10/2015 Cath: 20% mid RCA.   Carotid artery stenosis    1-39% right ICA stenosis and occluded left ICA   Colonic diverticular abscess    Diverticulitis    History of chemotherapy 2005   Cisplatin   Hypothyroidism    NICM (nonischemic cardiomyopathy) (HCC) 10/14/2015   a. Tachy mediated?;  b. Echo 3/17 - Mild concentric LVH, EF  30-35%, anteroseptal, anterior, anterolateral, apical anterior, lateral hypokinesis, trivial MR, mild to moderately reduced RVSF; c. LHC 3/17 - mRCA 20%   Radiation NOv.3,2005-Dec. 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  XRT   Past Surgical History:  Procedure Laterality Date   A-FLUTTER ABLATION N/A 06/19/2023   Procedure: A-FLUTTER ABLATION;  Surgeon: Boyce Byes, MD;  Location: [facility] INVASIVE CV LAB;  Service: Cardiovascular;  Laterality: N/A;   APPENDECTOMY N/A 03/07/2019   Procedure: ROBOTIC ASSISTED APPENDECTOMY;  Surgeon: Candyce Champagne, MD;  Location: [facility] ORS;  Service: General;  Laterality: N/A;   CARDIAC CATHETERIZATION N/A 10/18/2015   Procedure: Left Heart Cath and Coronary Angiography;  Surgeon: Odie Benne, MD;  Location: [facility] INVASIVE CV LAB;  Service: Cardiovascular;  Laterality: N/A;   CARDIOVERSION N/A 11/24/2015   Procedure: CARDIOVERSION;  Surgeon: Loyde Rule, MD;  Location: [facility] ENDOSCOPY;  Service: Cardiovascular;  Laterality: N/A;   CARDIOVERSION N/A 12/20/2022   Procedure: CARDIOVERSION;  Surgeon: Bridgette Campus, MD;  Location: [facility] INVASIVE CV LAB;  Service: Cardiovascular;  Laterality: N/A;   CYSTOSCOPY WITH STENT PLACEMENT Bilateral 03/07/2019   Procedure: CYSTOSCOPY WITH BILATERAL FIREFLY INJECTION;  Surgeon: Andrez Banker, MD;  Location: [facility] ORS;  Service: Urology;  Laterality: Bilateral;   GASTROSTOMY TUBE PLACEMENT  07/04/2004   IR - G tube for tonsilar cancer  IR ANGIO INTRA EXTRACRAN SEL COM CAROTID INNOMINATE UNI R MOD SED  03/09/2019   IR ANGIO VERTEBRAL SEL VERTEBRAL UNI R MOD SED  03/09/2019   IR CT HEAD LTD  03/09/2019   IR PERCUTANEOUS ART THROMBECTOMY/INFUSION INTRACRANIAL INC DIAG ANGIO  03/09/2019   IR RADIOLOGIST EVAL & MGMT  12/18/2018   LAMINECTOMY     C5/placement of steel plate   LOOP RECORDER INSERTION N/A 06/19/2023   Procedure: LOOP RECORDER INSERTION;  Surgeon: Boyce Byes, MD;   Location: [facility] INVASIVE CV LAB;  Service: Cardiovascular;  Laterality: N/A;   NECK SURGERY  2003   replaced disk   RADIOLOGY WITH ANESTHESIA N/A 03/08/2019   Procedure: IR WITH ANESTHESIA;  Surgeon: Luellen Sages, MD;  Location: [facility] OR;  Service: Radiology;  Laterality: N/A;   TEE WITHOUT CARDIOVERSION N/A 10/15/2015   Procedure: TRANSESOPHAGEAL ECHOCARDIOGRAM (TEE);  Surgeon: Darlis Eisenmenger, MD;  Location: [facility] ENDOSCOPY;  Service: Cardiovascular;  Laterality: N/A;   TEE WITHOUT CARDIOVERSION N/A 11/24/2015   Procedure: TRANSESOPHAGEAL ECHOCARDIOGRAM (TEE);  Surgeon: Loyde Rule, MD;  Location: [facility] ENDOSCOPY;  Service: Cardiovascular;  Laterality: N/A;   TEE WITHOUT CARDIOVERSION N/A 12/20/2022   Procedure: TRANSESOPHAGEAL ECHOCARDIOGRAM;  Surgeon: Bridgette Campus, MD;  Location: [facility] INVASIVE CV LAB;  Service: Cardiovascular;  Laterality: N/A;   XI ROBOTIC ASSISTED COLOSTOMY TAKEDOWN N/A 03/07/2019   Procedure: XI ROBOTIC ASSISTED LOW ANTERIOR RESECTION, RIGID PROCTOSCOPY;  Surgeon: Candyce Champagne, MD;  Location: [facility] ORS;  Service: General;  Laterality: N/A;   Patient Active Problem List   Diagnosis Date Noted   Carotid occlusion, left 10/23/2023   ICH (intracerebral hemorrhage) (HCC) 08/11/2023   Carotid artery stenosis    Atrial flutter (HCC) 12/19/2022   Essential hypertension 04/22/2021   Hypokalemia 03/12/2019   Expressive aphasia 03/10/2019   Dysphagia 03/10/2019   Middle cerebral artery embolism, left 03/09/2019   Diverticular stricture (HCC) 03/07/2019   Bacteria in urine    Chronic atrial fibrillation (HCC) 12/02/2018   Current use of long term anticoagulation 12/02/2018   UTI (urinary tract infection) 11/29/2018   Hyponatremia 11/29/2018   PAF (paroxysmal atrial fibrillation) (HCC) 11/29/2018   Diverticulitis of large intestine with abscess 09/23/2018   Dyslipidemia 12/05/2017   CAD (coronary artery disease), native coronary artery 12/06/2015   SOB (shortness of breath)     NICM (nonischemic cardiomyopathy) (HCC)    Thyroid  activity decreased    Hypothyroidism 08/12/2013   History of radiation therapy 05/30/2012   Smokes tobacco daily 05/30/2012   Cancer of tonsillar fossa (HCC) 11/27/2011   Tonsil cancer (HCC) 2005   History of chemotherapy 2005    ONSET DATE: Referred on 10/26/23 (CVA in Jan)   REFERRING DIAG: I61.0 (ICD-10-CM) - Nontraumatic subcortical hemorrhage of left cerebral hemisphere (HCC)   THERAPY DIAG:  Cognitive communication deficit  Aphasia  Rationale for Evaluation and Treatment: Rehabilitation  SUBJECTIVE:   SUBJECTIVE STATEMENT: Pt reports he met with a friend yesterday that he hadn't seen in awhile.   Pt accompanied by: self  PERTINENT HISTORY: Per chart review: 68 y.o. male with history of atrial fibrillation, on Eliquis , carotid artery stenosis, hypothyroidism , CVA 1/25  PAIN:  Are you having pain? No  FALLS: Has patient fallen in last 6 months?  No  LIVING ENVIRONMENT: Lives with: lives alone Lives in: House/apartment Ex Wife will be driving to appointments (or friends) Sheryle Donning  PLOF:  Level of assistance: Independent with ADLs, Independent with IADLs Employment: Retired; [employer]   PATIENT GOALS:  memory, word finding, speech   OBJECTIVE:  Note: Objective measures were completed at Evaluation unless otherwise noted.  DIAGNOSTIC FINDINGS: Per Chart Review  CT HEAD CODE STROKE WO CONTRAST 08/11/23  IMPRESSION: 1.8 x 2.0 x 1.1 cm parenchymal hemorrhage centered in the left basal ganglia with intraventricular extension. No evidence of hydrocephalus at the time of exam. No significant midline shift.   Findings were paged to Dr. Murvin Arthurs on 08/11/23 at 2:29 PM     Electronically Signed   By: Clora Dane M.D.   On: 08/11/2023 14:31  COGNITION: Overall cognitive status: Impaired Areas of impairment:  Attention: Impaired: Selective, Alternating, Divided Memory: Impaired: Short term Prospective Executive  function: Impaired: Problem solving, Organization, Planning, and Slow processing Functional deficits: Reports difficulty with recall of important information  AUDITORY COMPREHENSION: Overall auditory comprehension: Appears intact YES/NO questions: Appears intact Following directions: Appears intact Conversation: Complex Interfering components: attention and processing speed Effective technique: extra processing time, pausing, and repetition/stressing words  READING COMPREHENSION: Intact  EXPRESSION: verbal  VERBAL EXPRESSION: Level of generative/spontaneous verbalization: conversation  Comments: Anomia  Non-verbal means of communication: N/A  WRITTEN EXPRESSION: Dominant hand: right Written expression: Not tested  MOTOR SPEECH: Overall motor speech: impaired Level of impairment: Conversation Respiration: WFL Phonation: normal Resonance: WFL Articulation: Appears intact; pt reports slurred speech when feeling tired Intelligibility: Intelligible; 100% intelligible at eval; reports worsening speech when fatigued Motor planning: Appears intact Motor speech errors: NA Interfering components: NA Effective technique: slow rate and over articulate  ORAL MOTOR EXAMINATION: Overall status: Impaired:   Labial: Right (Symmetry) Facial: Right (Symmetry) *ROM is good. At rest, facial droop on R side. Comments: NA   STANDARDIZED ASSESSMENTS: Initiated CLQT - to complete next session    Cognitive Linguistic Quick Test: AGE - 18 - 69   The Cognitive Linguistic Quick Test (CLQT) was administered to assess the relative status of five cognitive domains: attention, memory, language, executive functioning, and visuospatial skills. Scores from 10 tasks were used to estimate severity ratings (standardized for age groups 18-69 years and 70-89 years) for each domain, a clock drawing task, as well as an overall composite severity rating of cognition.       Task Score Criterion Cut  Scores  Personal Facts 7/8 8  Symbol Cancellation 11/12 11  Confrontation Naming 10/10 10  Clock Drawing  11/13 12  Story Retelling 6/10 6  Symbol Trails 10/10 9  Generative Naming 3/9 5  Design Memory 6/6 5  Mazes  8/8 7  Design Generation 5/13 6     Cognitive Domain Composite Score Severity Rating  Attention 190/215 WNL  Memory 148/185 Mild  Executive Function 26/40 WNL  Language 26/37 Mild  Visuospatial Skills 95/105 WNL  Clock Drawing  11/13 Mild  Composite Severity Rating  WNL           PATIENT REPORTED OUTCOME MEASURES (PROM): Neuro QOL - Cognitive Short Form: 29     12/26/23: Neuro QOL - Cognitive Short Form: 30  TREATMENT DATE:        12/26/23: Pt was seen for skilled ST services targeting discharge education, review, and memory strategies. Pt reports feeling ready for discharge, though feels as if he is "not back to where he was". SLP explained spontaneous recovery and the importance of using compensatory strategies to support his current level. Pt reported understanding. Pt has been encouraged to continue strategy practice at home and to return if he feels he needs more directed practice. SLP completed memory strategies. Pt has no questions at this time and is to be d/c'd.   12/24/23: Pt was seen for skilled ST services targeting cognitive-communication. Pt reports he saw a friend and had a few of instances of anomia. He explained to friend how he had a stroke to reduce pressure on communication as suggested by SLP. Pt reported there were no instances where he was completely stuck and couldn't move forward. Pt reports he has not been using word finding strategies at home. SLP demonstrated use of Constant Therapy to continue working towards word finding, if pt feels he struggles to attempt word finding strategies in conversation. Pt struggled to  identify verbs and categories from pictures. SLP demonstrated how he could use "describe" to promote word finding. Pt reports finding this "difficult". SLP explained that it takes practice to implement word finding strategies successfully. To attempt to complete memory strategies next session   12/20/23: Pt was seen for skilled ST services targeting cognitive-communication. Pt reported he has not been using any word finding strategies; though, he said he has been describing something if he can't think of the word. SLP explained this is a word finding strategy. SLP initiated education on memory strategies this session. Pt reports he has been having some trouble with memory re: forgot things on his shopping list (forgot his cell phone at home with his list). We discussed "the rule of 3" - 3 objects before leaving the house (keys, wallet, phone). Pt reports he has been using "slow down" as his preferred dysarthria strategy. Pt was able to recall when he needed to be more cognizant of using these strategies. To continue with internal memory strategies next session.    12/12/23: Pt was seen for skilled ST services targeting word finding strategies. Pt participated in therapeutic exercise where he was asked to describe basic objects to SLP. SLP was able to deduce the object based off the description in 70% of exemplars. Pt benefited from prompts re: "what does it do/ what is it used for" which allowed pt to narrow down description as needed. To begin memory strategies next session.   12/11/23: Pt was seen for skilled ST services targeting cognitive-communication. Pt reports he has been attempting word finding strategies at home - but feels like he should challenge himself to do it without using strategies. SLP explained the purpose of strategies is to improve ability to communicate thoughts/word more efficiently. Pt reports he gets frustrated that he is not able to do it as quickly as he would like. SLP completed  education on attention strategies - pt reports "I am not really doing anything at home or around a bunch of people", and does not feel like he could benefit from these. To continue with word finding practice next session.   12/05/23: Pt was seen for skilled ST services targeting cognitive-communication. Pt reports he has not been trying to use word finding strategies at home. SLP to continue practice of word finding strategies to encourage transfer of strategies to home/community.  SLP initiated education on attention strategies this session re: managing fatigue - spoon theory, managing distractions, brain breaks. To cont with strategy education next session.    12/03/23: Pt was seen for skilled ST services. Pt reports speech intelligibility has been good, but still struggling when feeling more tired. He reports he has been mindful of dysarthria strategies during these times re: pausing and increasing loudness. Pt feels word finding is the most difficult for him at this time and would like to focus on this. He reports he also has trouble understanding the TV. SLP suggested putting subtitles on the TV to see if this is helpful. Pt reported he had an instance where he could not think of the name "iris". Pt did not use a strategy in this instance. SLP suggested trying "look it up" and demonstrated how to complete this on google. SLP facilitated use of "look it up" strategy with patient using iphone. Pt was able to generate adequate descriptions to find missing words for [employer], his favorite pizza place. Pt participated in informal conversation with no instances of anomia.   11/28/23: Pt was seen for skilled ST services targeting cognitive-communication. SLP edu on using microphone speech-to-text to make notes to support memory. Pt reports he did not do any writing since he was last here - but he has been thinking about it. SLP included writing in today's therapeutic task re: creation of dysarthria BE CLEAR  sentences. Pt was able to generate and produce sentences with intelligible speech independently. Pt's speech continues to be 100% intelligible; however, pt reports this is not the case when he is fatigued. He understands this is when he should be most cognizant of using dysarthria strategies. Pt was tasked with writing 5, SLP-dictated, functional sentences. Pt required modA to spell words correctly. Pt was demonstrate use of speech to text to assist in error correction of sentences.   11/26/23: Pt was seen for skilled ST services targeting cognitive-communication. Pt reports he used "synonym" word finding strategy in an instance of anomia over the week. Pt reports he hasn't had any other significant difficulty with word finding. We reviewed "Be Clear" strategies for dysarthria re: speaking louder, over-articulating, slow down. SLP checked-in on pt's feelings regarding cognitive difficulties. He reports he hasn't noticed any significant problems; however, he realizes he has forgotten to bring his folder in x2. SLP suggested writing down tasks. Pt reported difficulty with writing from his previous strokes. SLP suggested using speech to text. Pt reported he has been leaving his cell phone at home. SLP encouraged him to always keep his cell nearby in case of emergencies, as well as, a memory tool. Pt attempted to write list of tasks needed to complete. Spelling errors noted. SLP encouraged pt to spell the word in his head prior to writing it down vs dividing attention between trying to figure out how to spell the word while attempting to write. Pt required max cue reminders to first spell the word in his head before attempting to write. Pt was noted to reduce errors by ~50% using therapist strategy.   11/20/23: Pt was seen for skilled ST services targeting cognitive-communication and word finding strategies. Pt completed Cognitive PROMs measure - see above in blue. Pt reports he hasn't had any significant instances of  anomia - given a 2-3 second delay, word typically pops out. Pt reports he is to go golfing (on the putting green) with a friend for his birthday. We discussed word finding strategies he could use in  conversation. SLP facilitated word finding practice using multiple meaning words. Pt was tasked with finding the synonym (or a phrase close) for the given word. SLP challenged the patient to find synonyms to different golf terms in preparation for his outing. SLP briefly educated on "Be Clear" for dysarthria. To continue next session.   11/15/23: Pt was seen for skilled ST services targeting cognitive-communication and word finding strategies. SLP initiated education on word finding. SLP provided examples of each strategies and how to use in conversation. Pt reported he feels he would like to the following word finding strategies re: delay, describe, association, look it up. Pt was noted to have errors when reading aloud which was impacting his reading comprehension. Pt was noted to delete sounds from words  and add functor words while reading. SLP trialed re: pointing to each word with pen, re-reading aloud, re-reading silently to remove expressive component. To demonstrate "describe" strategy through Semantic Feature Analysis (SFA) next session.   11/09/23: Pt was seen for skilled ST services targeting completed cognitive-communication testing and education. SLP reviewed CLQT results with patient. He had no further questions. SLP provided pt with cognitive-communication handout. Pt reported difficulty with re: recalling details in conversations, understanding TV shows, difficulty with expressing thoughts, and reading/writing.  Pt has residual aphasia from previous stroke. To provide strategies in this POC.   11/07/23: Provided brief education on cognitive-communication disorder and speech intelligibility.   PATIENT EDUCATION: Education details: SLP role in cognitive-communication impairments Person educated:  Patient Education method: Explanation Education comprehension: needs further education   GOALS: Goals reviewed with patient? No; to update after further evaluation  SHORT TERM GOALS: Target date: 12/08/23  Continue Cognitive testing to determine needs Baseline: Goal status: MET  2.  Complete PROMs measure Baseline:  Goal status: MET  3.  Pt will recall 3 word finding strategies to use in instances of anomia with independently. Baseline:  Goal status: MET  4.  Pt will verbalize 2 attention strategies to improve concentration during functional tasks.  Baseline:  Goal status:NOT MET  5. Pt will verbalize 2 memory strategies for recall of important information.  Baseline:  Goal status: NOT MET    LONG TERM GOALS: Target date: 01/08/24  Pt will improve score on PROMs measure Baseline:  Goal status: MET  2.  Pt will report successful use of word finding strategies at home and in the community. Baseline:  Goal status: NOT MET 3.  Pt will report successful use of attention strategies at home and in the community. Baseline:  Goal status: NOT MET  4.  Pt will report successful use of memory strategies at home and in the community.  Baseline:  Goal status: NOT MET   ASSESSMENT:  CLINICAL IMPRESSION: Pt is a 68 yo male who presents to ST OP for evaluation post recent CVA in Jan. See tx note. Pt in agreement with discharge.     OBJECTIVE IMPAIRMENTS: include memory, executive functioning, expressive language, and dysarthria. These impairments are limiting patient from effectively communicating at home and in community. Factors affecting potential to achieve goals and functional outcome are NA. Patient will benefit from skilled SLP services to address above impairments and improve overall function.  REHAB POTENTIAL: Good  PLAN:  SLP FREQUENCY: 1-2x/week  SLP DURATION: 8 weeks  PLANNED INTERVENTIONS: Environmental controls, Cueing hierachy, Internal/external aids,  Functional tasks, SLP instruction and feedback, Compensatory strategies, Patient/family education, and [id number] Treatment of speech (30 or 45 min)     [employer], CCC-SLP  12/26/2023, 9:40 AM

## 2024-01-02 ENCOUNTER — Encounter: Payer: Self-pay | Admitting: Occupational Therapy

## 2024-01-02 ENCOUNTER — Other Ambulatory Visit: Payer: Self-pay

## 2024-01-02 ENCOUNTER — Ambulatory Visit: Admitting: Occupational Therapy

## 2024-01-02 ENCOUNTER — Ambulatory Visit: Admitting: Physical Therapy

## 2024-01-02 ENCOUNTER — Encounter: Payer: Self-pay | Admitting: Physical Therapy

## 2024-01-02 ENCOUNTER — Ambulatory Visit: Admitting: Speech Pathology

## 2024-01-02 DIAGNOSIS — M6281 Muscle weakness (generalized): Secondary | ICD-10-CM

## 2024-01-02 DIAGNOSIS — R2681 Unsteadiness on feet: Secondary | ICD-10-CM

## 2024-01-02 DIAGNOSIS — R262 Difficulty in walking, not elsewhere classified: Secondary | ICD-10-CM

## 2024-01-02 DIAGNOSIS — R2689 Other abnormalities of gait and mobility: Secondary | ICD-10-CM

## 2024-01-02 DIAGNOSIS — R278 Other lack of coordination: Secondary | ICD-10-CM

## 2024-01-02 DIAGNOSIS — R41844 Frontal lobe and executive function deficit: Secondary | ICD-10-CM

## 2024-01-02 DIAGNOSIS — R41840 Attention and concentration deficit: Secondary | ICD-10-CM

## 2024-01-02 NOTE — Therapy (Signed)
 OUTPATIENT PHYSICAL THERAPY TREATMENT    Patient Name: Adrian [name] MRN: [id number] DOB:1955-12-22, 68 y.o., male Today's Date: 01/02/2024      END OF SESSION:  PT End of Session - 01/02/24 1031     Visit Number 16    Number of Visits 32    Date for PT Re-Evaluation 02/27/24    Authorization Type MCR    Authorization Time Period 10/31/23 to 12/26/23; extended to 02/27/24    Progress Note Due on Visit 20    PT Start Time 1018    PT Stop Time 1056    PT Time Calculation (min) 38 min    Activity Tolerance Patient tolerated treatment well    Behavior During Therapy [facility] for tasks assessed/performed                            Past Medical History:  Diagnosis Date   AF (paroxysmal atrial fibrillation) (HCC) 11/29/2018   Atrial flutter (HCC) 10/12/2015   a. TEE 3/17 with ? LAA clot-->s/p TEE/DCCV 11/24/2015; s/p DCCV 12/2022;s/p ablation 06/2023   CAD (coronary artery disease), native coronary artery 12/06/2015   a. 10/2015 MV: EF  37%, reversible defect inferior apex, intermediate risk findings; b. 10/2015 Cath: 20% mid RCA.   Carotid artery stenosis    1-39% right ICA stenosis and occluded left ICA   Colonic diverticular abscess    Diverticulitis    History of chemotherapy 2005   Cisplatin   Hypothyroidism    NICM (nonischemic cardiomyopathy) (HCC) 10/14/2015   a. Tachy mediated?;  b. Echo 3/17 - Mild concentric LVH, EF 30-35%, anteroseptal, anterior, anterolateral, apical anterior, lateral hypokinesis, trivial MR, mild to moderately reduced RVSF; c. LHC 3/17 - mRCA 20%   Radiation NOv.3,2005-Dec. 15, 2005   6810 cGy in 30 fractions   Tonsil cancer [facility] [place]) 2005   Dr Ava Lei.  XRT   Past Surgical History:  Procedure Laterality Date   A-FLUTTER ABLATION N/A 06/19/2023   Procedure: A-FLUTTER ABLATION;  Surgeon: Boyce Byes, MD;  Location: [facility] INVASIVE CV LAB;  Service: Cardiovascular;  Laterality: N/A;   APPENDECTOMY N/A 03/07/2019   Procedure: ROBOTIC  ASSISTED APPENDECTOMY;  Surgeon: Candyce Champagne, MD;  Location: [facility] ORS;  Service: General;  Laterality: N/A;   CARDIAC CATHETERIZATION N/A 10/18/2015   Procedure: Left Heart Cath and Coronary Angiography;  Surgeon: Odie Benne, MD;  Location: [facility] INVASIVE CV LAB;  Service: Cardiovascular;  Laterality: N/A;   CARDIOVERSION N/A 11/24/2015   Procedure: CARDIOVERSION;  Surgeon: Loyde Rule, MD;  Location: [facility] ENDOSCOPY;  Service: Cardiovascular;  Laterality: N/A;   CARDIOVERSION N/A 12/20/2022   Procedure: CARDIOVERSION;  Surgeon: Bridgette Campus, MD;  Location: [facility] INVASIVE CV LAB;  Service: Cardiovascular;  Laterality: N/A;   CYSTOSCOPY WITH STENT PLACEMENT Bilateral 03/07/2019   Procedure: CYSTOSCOPY WITH BILATERAL FIREFLY INJECTION;  Surgeon: Andrez Banker, MD;  Location: [facility] ORS;  Service: Urology;  Laterality: Bilateral;   GASTROSTOMY TUBE PLACEMENT  07/04/2004   IR - G tube for tonsilar cancer   IR ANGIO INTRA EXTRACRAN SEL COM CAROTID INNOMINATE UNI R MOD SED  03/09/2019   IR ANGIO VERTEBRAL SEL VERTEBRAL UNI R MOD SED  03/09/2019   IR CT HEAD LTD  03/09/2019   IR PERCUTANEOUS ART THROMBECTOMY/INFUSION INTRACRANIAL INC DIAG ANGIO  03/09/2019   IR RADIOLOGIST EVAL & MGMT  12/18/2018   LAMINECTOMY     C5/placement of steel plate   LOOP RECORDER INSERTION  N/A 06/19/2023   Procedure: LOOP RECORDER INSERTION;  Surgeon: Boyce Byes, MD;  Location: [facility] INVASIVE CV LAB;  Service: Cardiovascular;  Laterality: N/A;   NECK SURGERY  2003   replaced disk   RADIOLOGY WITH ANESTHESIA N/A 03/08/2019   Procedure: IR WITH ANESTHESIA;  Surgeon: Luellen Sages, MD;  Location: [facility] OR;  Service: Radiology;  Laterality: N/A;   TEE WITHOUT CARDIOVERSION N/A 10/15/2015   Procedure: TRANSESOPHAGEAL ECHOCARDIOGRAM (TEE);  Surgeon: Darlis Eisenmenger, MD;  Location: [facility] ENDOSCOPY;  Service: Cardiovascular;  Laterality: N/A;   TEE WITHOUT CARDIOVERSION N/A 11/24/2015   Procedure: TRANSESOPHAGEAL ECHOCARDIOGRAM  (TEE);  Surgeon: Loyde Rule, MD;  Location: [facility] ENDOSCOPY;  Service: Cardiovascular;  Laterality: N/A;   TEE WITHOUT CARDIOVERSION N/A 12/20/2022   Procedure: TRANSESOPHAGEAL ECHOCARDIOGRAM;  Surgeon: Bridgette Campus, MD;  Location: [facility] INVASIVE CV LAB;  Service: Cardiovascular;  Laterality: N/A;   XI ROBOTIC ASSISTED COLOSTOMY TAKEDOWN N/A 03/07/2019   Procedure: XI ROBOTIC ASSISTED LOW ANTERIOR RESECTION, RIGID PROCTOSCOPY;  Surgeon: Candyce Champagne, MD;  Location: [facility] ORS;  Service: General;  Laterality: N/A;   Patient Active Problem List   Diagnosis Date Noted   Carotid occlusion, left 10/23/2023   ICH (intracerebral hemorrhage) (HCC) 08/11/2023   Carotid artery stenosis    Atrial flutter (HCC) 12/19/2022   Essential hypertension 04/22/2021   Hypokalemia 03/12/2019   Expressive aphasia 03/10/2019   Dysphagia 03/10/2019   Middle cerebral artery embolism, left 03/09/2019   Diverticular stricture (HCC) 03/07/2019   Bacteria in urine    Chronic atrial fibrillation (HCC) 12/02/2018   Current use of long term anticoagulation 12/02/2018   UTI (urinary tract infection) 11/29/2018   Hyponatremia 11/29/2018   PAF (paroxysmal atrial fibrillation) (HCC) 11/29/2018   Diverticulitis of large intestine with abscess 09/23/2018   Dyslipidemia 12/05/2017   CAD (coronary artery disease), native coronary artery 12/06/2015   SOB (shortness of breath)    NICM (nonischemic cardiomyopathy) (HCC)    Thyroid  activity decreased    Hypothyroidism 08/12/2013   History of radiation therapy 05/30/2012   Smokes tobacco daily 05/30/2012   Cancer of tonsillar fossa (HCC) 11/27/2011   Tonsil cancer (HCC) 2005   History of chemotherapy 2005    PCP: Glean Lamy MD   REFERRING PROVIDER: Genetta Kenning, MD  REFERRING DIAG: I61.0 (ICD-10-CM) - Nontraumatic subcortical hemorrhage of left cerebral hemisphere [facility])  THERAPY DIAG:  Muscle weakness (generalized) - Plan: PT plan of care  cert/re-cert  Other abnormalities of gait and mobility - Plan: PT plan of care cert/re-cert  Other lack of coordination - Plan: PT plan of care cert/re-cert  Unsteadiness on feet - Plan: PT plan of care cert/re-cert  Difficulty in walking, not elsewhere classified - Plan: PT plan of care cert/re-cert  Rationale for Evaluation and Treatment: Rehabilitation  ONSET DATE: 08/11/23  SUBJECTIVE:   SUBJECTIVE STATEMENT:  Doing OK would like to work on coordination. Still driving myself, its going well but keeping it limited.   PERTINENT HISTORY: 1.  History of left basal ganglia hemorrhage approximately 2 and half months ago.  He has completed inpatient rehabilitation as well as home health.  I do think he would be a great candidate for outpatient therapy.  He has gotten back to modified independent level but still has some mild weakness on the right side as well as fine motor issues and motor control issues.  His balance is also off.  He continues wear right AFO because of foot and ankle weakness.  Patient has  some short-term memory issues as well Will make referral for outpatient PT OT and speech therapy Physical medicine rehab follow-up in 3 months  PAIN:  Are you having pain? No  0/10 today    PRECAUTIONS: Fall and Other: R hemi, has loop recorder   RED FLAGS: None   WEIGHT BEARING RESTRICTIONS: No  FALLS:  Has patient fallen in last 6 months? No  LIVING ENVIRONMENT: Lives with: lives alone Lives in: House/apartment Stairs: 2 STE with rail  Has following equipment at home: Single point cane and Environmental consultant - 2 wheeled  OCCUPATION: retired- used to work for [employer]   PLOF: Independent, Independent with basic ADLs, Independent with gait, Independent with transfers, and Requires assistive device for independence  PATIENT GOALS: walk straight without a cane, get back to driving and motorcycling   NEXT MD VISIT: Referring 01/25/24  OBJECTIVE:  Note: Objective measures were completed  at Evaluation unless otherwise noted.  DIAGNOSTIC FINDINGS:   CLINICAL DATA:  Stroke, follow up Neuro deficit, acute, stroke suspected   EXAM: MRI HEAD WITHOUT AND WITH CONTRAST   TECHNIQUE: Multiplanar, multiecho pulse sequences of the brain and surrounding structures were obtained without and with intravenous contrast.   CONTRAST:  6.5mL GADAVIST  GADOBUTROL  1 MMOL/ML IV SOLN   COMPARISON:  CT head from today.   FINDINGS: Brain: When comparing across modalities, no substantial change in intraparenchymal hemorrhage in the left basal ganglia with intraventricular extension. No visible surrounding acute hemorrhage or mass lesion; however, acute blood products limits assessment. Prior left frontal infarct with encephalomalacia. No hydrocephalus. No pathologic enhancement.   Vascular: Major arterial flow voids are maintained at the skull base.   Skull and upper cervical spine: Normal marrow signal.   Sinuses/Orbits: Right frontal sinus opacification. Remaining sinuses are clear. No acute orbital findings.   Other: No mastoid effusions.   IMPRESSION: 1. When comparing across modalities, no substantial change in intraparenchymal hemorrhage in the left basal ganglia with intraventricular extension. No progressive mass effect. 2. No visible surrounding acute hemorrhage or mass lesion; however, acute blood products limits assessment  EXAM: CT HEAD WITHOUT CONTRAST   TECHNIQUE: Contiguous axial images were obtained from the base of the skull through the vertex without intravenous contrast.   RADIATION DOSE REDUCTION: This exam was performed according to the departmental dose-optimization program which includes automated exposure control, adjustment of the mA and/or kV according to patient size and/or use of iterative reconstruction technique.   COMPARISON:  Head CT 08/12/2023   FINDINGS: Brain: Interval decrease in size of the previously seen hemorrhage in the left  basal ganglia, now measuring 10 x 7 mm, previously 20 x 12 mm. There is resolution of intraventricular blood products. No hydrocephalus. No extra-axial fluid collection. No mass effect. No mass lesion. No CT evidence of an acute cortical infarct. There is chronic left MCA territory infarct involving the left opercular region.   Vascular: No hyperdense vessel or unexpected calcification.   Skull: Normal. Negative for fracture or focal lesion.   Sinuses/Orbits: No middle ear or mastoid effusion. Paranasal sinuses are notable for complete opacification of the right frontal sinus. Orbits are unremarkable.   Other: None.   IMPRESSION: No acute intracranial abnormality. Interval decrease in size of left basal ganglia hemorrhage, now measuring 10 x 7 mm, previously 20 x 12 mm. Resolution of intraventricular blood products. No hydrocephalus.    PATIENT SURVEYS:    Patient-Specific Activity Scoring Scheme  "0" represents "unable to perform." "10" represents "able to perform at prior  level. 0 1 2 3 4 5 6 7 8 9  10 (Date and Score)   Activity Eval  12/03/23  12/24/23  1. Walking straight without the cane  4   6 6-7  2. Strength in RLE   4 6  5   3. Coordinated movements of R LE  3 6 3   4.     5.     Score 3.7 6 4.6   Total score = sum of the activity scores/number of activities Minimum detectable change (90%CI) for average score = 2 points Minimum detectable change (90%CI) for single activity score = 3 points     COGNITION: Overall cognitive status: Within functional limits for tasks assessed     SENSATION: Not tested      LOWER EXTREMITY MMT:  MMT Right eval Left eval 01/02/24 R   Hip flexion 4 5 4+  Hip extension     Hip abduction     Hip adduction     Hip internal rotation     Hip external rotation     Knee flexion 4 5 4+  Knee extension 4 5 4+  Ankle dorsiflexion 1 at best (in AFO, reports no movement in ankle)  DNT, in AFO   Ankle plantarflexion      Ankle inversion     Ankle eversion      (Blank rows = not tested)    FUNCTIONAL TESTS:  5 times sit to stand: 12.8 seconds some off shift from R LE  Timed up and go (TUG): deferred  3 minute walk test: 228ft [facility]     11/28/23 0001  Dynamic Gait Index  Level Surface 2  Change in Gait Speed 2  Gait with Horizontal Head Turns 2  Gait with Vertical Head Turns 2  Gait and Pivot Turn 2  Step Over Obstacle 2  Step Around Obstacles 2  Steps 1  Total Score 15     GAIT: Distance walked: 282ft  Assistive device utilized: Single point cane Level of assistance: Modified independence Comments: wide BOS, increased tone in R LE with gait pattern- limited knee ROM and ankle tends to invert a bit , mild unsteadiness with corners, limited rotation at trunk/hips                                                                                                                                 TREATMENT DATE:    01/02/24  MMT Nustep L6x8 minutes BLEs only   Alternating toe taps to soft targets on belt of TM- difficult to maintain WB R LE  in stance for movement with L LE Alternating cross midline toe taps to soft targets on belt of TM, light UE touch for balance/safety  Forwards/backwards walking with randomized direction changes min guard  Side stepping with randomized direction changes min guard  Alternating toe taps to triple targets at various levels, light BUE support    Education on 6 month  window after CVA in terms of neuroplasticity and potential for best gains, benefits of continuing PT, importance of aerobic exercise and reciprocal motions (like nustep or bike) for tissue perfusion and ongoing recovery      12/24/23:    12/20/23  Nustep seat 7 L6x8 minutes all four extremities  STS goblet squat with eccentric lower 7# x12  STS with 4 inch box under L foot/7# in R UE cues for even wt and good eccentric  Forward heel taps for eccentric control/strength R LE 4 inch step  x10   Hill and curb training outside with spc, S and intermittent cues for sequencing but improving           12/12/23  Side step over blue air pad (R) + forward step over mini air pads + side step over blue air pad (L) + side step over blue air pad (R) + forward step over mini air pads + side step over blue air pad (R) x3 rounds  Lateral toe taps alternating to small air targets + forward step up then step down on 4 inch box + alternating toe taps on bosu + step up then step down 4 inch box- difficulty maintaining good alignment with step down (1 round)   Nustep L6x8 minutes all four extremities  Gait outside over curbs and hills  with hurricane, general S and occasional cues for sequencing         12/11/23  Nustep L6x8 minutes all four extremities seat 7   One foot on BOSU/other on ground 3x30 seconds B PWR step laterally + reach to target at ground level on R x5 (standing) PWR step forward + mini squat to [facility] press to mini squat + backwards PWR step x5 Cross midline reach/wt shift to RLE + STS with multimodal cues to maintain wt on RLE with transitions x8  Gait and curb training outside- slopes and hills on even and uneven surfaces, occasional cues for sequencing with curb navigation. Difficulty with eccentric quad work on hemi side  when going down hill     12/05/23  Nustep L6 x8 minutes all four extremities seat 7  Hip hikes x10 B min-mod cues for form  Forward step ups 6 inch step x12 B intermittent UE support  Gait training 254ft 1.5# R ankle for improved proprioception, cues for heel toe pattern especially push off with toe when about to enter swing phase  Backwards walking with 1.5# cues for improved step length L LE to promote increased stance time hemi LE    3 way taps off blue foam pad x10 B, difficulty due to weak hip ABD groups hemi LE  Side steps + forward/backward steps over blue foam pad x2 laps            12/03/23  Nustep L6x8 minutes all  four extremities for w/u, promotion of reciprocal movement  Forward step ups 6 inch step R LE intermittent UE support for mm endurance and WB tone management   One foot on BOSU/other on solid surface 3x30 seconds B- cues to not lock out R knee Forward lunges onto BOSU x10 B- cues to not lock out R knee in stance, and to avoid R LE circumduction when lifting to top of BOSU Lateral lunges onto BOSU x10 B- cues to not lock out R knee in stance, and to avoid RLE circumduction with movement to top of ball  Gait with 1.5# R LE for improved proprioception x140ft x2- first lap with hurricane, second without.  Noted increased LE circumduction without AD    11/28/23   Side step, step over forward and backward on blue foam pad in // bars  DGI re-test  Tandem walk with lateral toe tap each step, alternating blue foam pad // bars     STS with yellow ball in goblet hold x10, cues to avoid offshift from hemi side- placed R foot back/L foot forward for majority of reps  Nustep L6x8 minutes all four extremities for reciprocal movement and functional activity tolerance  Heel toe steps on blue air pad for assist with rocking motion x10 B, cues to normalize heel-toe and swing through, followed by gait training with focus on heel-toe pattern and keeping hips level            PATIENT EDUCATION:  Education details: exam findings, POC, HEP, otherwise as above  Person educated: Patient Education method: Explanation, Demonstration, and Handouts Education comprehension: verbalized understanding, returned demonstration, and needs further education  HOME EXERCISE PROGRAM:  Access Code: [email or web address] URL: [email or web address] Date: 11/20/2023 Prepared by: Terrel Ferries  Exercises - Heel Toe Raises with Counter Support  - 1 x daily - 7 x weekly - 2 sets - 10 reps - Stride Stance Weight Shift  - 1 x daily - 7 x weekly - 2 sets - 10 reps - Single Leg Balance with Clock Reach  - 1 x daily - 7  x weekly - 2 sets - 10 reps - Sit to Stand with Resistance Around Legs  - 1 x daily - 7 x weekly - 2 sets - 10 reps - Forward Step Over with Counter Support  - 1 x daily - 7 x weekly - 2 sets - 10 reps - Backward Step Over with Counter Support  - 1 x daily - 7 x weekly - 2 sets - 10 reps   ASSESSMENT:  CLINICAL IMPRESSION:            Pt arrives today doing well, we continued to push coordination and balance training today as well as trying to facilitate normalized movement patterns. Remains motivated, sounds like he feels like he is making slow and steady progress- would really recommend continuing with skilled PT services to take advantage of ongoing neuroplasticity post-CVA.   From eval: Patient is a 68 y.o. M who was seen today for physical therapy evaluation and treatment for I61.0 (ICD-10-CM) - Nontraumatic subcortical hemorrhage of left cerebral hemisphere [facility]). He is very motivated and moving a very high level given the hemorrhagic CVA. His main concerns are balance/coordination and getting back to walking without assistive device. Anticipate he will do very well with skilled PT services.   OBJECTIVE IMPAIRMENTS: Abnormal gait, decreased activity tolerance, decreased balance, decreased coordination, decreased knowledge of use of DME, decreased mobility, difficulty walking, decreased strength, and decreased safety awareness.   ACTIVITY LIMITATIONS: standing, squatting, stairs, transfers, and locomotion level  PARTICIPATION LIMITATIONS: driving, shopping, community activity, and yard work  PERSONAL FACTORS: Age, Behavior pattern, Fitness, Past/current experiences, Social background, Time since onset of injury/illness/exacerbation, and Transportation are also affecting patient's functional outcome.   REHAB POTENTIAL: Good  CLINICAL DECISION MAKING: Evolving/moderate complexity  EVALUATION COMPLEXITY: Moderate   GOALS: Goals reviewed with patient? No  SHORT TERM GOALS: Target  date: 01/30/2024     Will be compliant with appropriate progressive HEP  Baseline: Goal status: MET 11/28/23  2.  Will score 14/24 on DGI  Baseline: 10 Goal status: MET 11/28/23 15/24  3.  Will be able to perform all  functional transfers without compensation strategies or offshift from hemi LE  Baseline:  Goal status: ONGOING 01/02/24- progressing well but still present   4.  Will be able to ambulate household distances without AD, Mod(I) Baseline:  Goal status: ONGOING 01/02/24 still using cane 50% of the time     LONG TERM GOALS: Target date: 02/27/2024      MMT in R LE to be at least 4+/5 in hip and knee musculature  Baseline:  Goal status: 01/02/24 PARTIALLY MET with exception of ankle DF  2.  Will score 18/24 on DGI to show improved functional balance  Baseline:  Goal status: 12/24/23: 15/24  3.  Will be able to walk at least 238ft over uneven/unsteady surfaces without device and no more than S/ 5103ft over even surfaces no device Mod(I) Baseline:  Goal status: 12/24/23: to walk even terrain without device  , needs cane for curbs, uneven, over 700' mod I with cane  4.  Will be able to ascend and descend steps without rail and minimal compensation strategies  Baseline:  Goal status: 12/24/23: using rail  5.  Will be compliant with advanced HEP vs gym based program at DC to maintain functional gains  Baseline:  Goal status: 12/24/23: has a [employer] membership and has not utlized yet  6.  PSFS to improve by at least 3 points  Baseline: 3.7 Goal status: ONGOING 12/03/23 6 12/24/23: ongoing 4.7   PLAN:  PT FREQUENCY: 2x/week  PT DURATION: 8 weeks  PLANNED INTERVENTIONS: 97164- PT Re-evaluation, 97110-Therapeutic exercises, 97530- Therapeutic activity, 97112- Neuromuscular re-education, 97535- Self Care, 40981- Manual therapy, [phone]- Gait training, [phone]- Orthotic Fit/training, [phone]- Aquatic Therapy, Stair training, Taping, Dry Needling, Cryotherapy, and Moist  heat  PLAN FOR NEXT SESSION: continue to normalize gait pattern and functional movements, coordination and balance work   Terrel Ferries, PT, DPT 01/02/24 10:56 AM

## 2024-01-02 NOTE — Therapy (Deleted)
 OUTPATIENT OCCUPATIONAL THERAPY NEURO TREATMENT  Patient Name: Adrian [name] MRN: [id number] DOB:Dec 10, 1955, 68 y.o., male Today's Date: 01/02/2024  PCP: Dr. Darren Em REFERRING PROVIDER: Dr. Sharl Davies  END OF SESSION:             Past Medical History:  Diagnosis Date   AF (paroxysmal atrial fibrillation) (HCC) 11/29/2018   Atrial flutter (HCC) 10/12/2015   a. TEE 3/17 with ? LAA clot-->s/p TEE/DCCV 11/24/2015; s/p DCCV 12/2022;s/p ablation 06/2023   CAD (coronary artery disease), native coronary artery 12/06/2015   a. 10/2015 MV: EF  37%, reversible defect inferior apex, intermediate risk findings; b. 10/2015 Cath: 20% mid RCA.   Carotid artery stenosis    1-39% right ICA stenosis and occluded left ICA   Colonic diverticular abscess    Diverticulitis    History of chemotherapy 2005   Cisplatin   Hypothyroidism    NICM (nonischemic cardiomyopathy) (HCC) 10/14/2015   a. Tachy mediated?;  b. Echo 3/17 - Mild concentric LVH, EF 30-35%, anteroseptal, anterior, anterolateral, apical anterior, lateral hypokinesis, trivial MR, mild to moderately reduced RVSF; c. LHC 3/17 - mRCA 20%   Radiation NOv.3,2005-Dec. 15, 2005   6810 cGy in 30 fractions   Tonsil cancer [facility]) 2005   Dr Ava Lei.  XRT   Past Surgical History:  Procedure Laterality Date   A-FLUTTER ABLATION N/A 06/19/2023   Procedure: A-FLUTTER ABLATION;  Surgeon: Boyce Byes, MD;  Location: [facility] INVASIVE CV LAB;  Service: Cardiovascular;  Laterality: N/A;   APPENDECTOMY N/A 03/07/2019   Procedure: ROBOTIC ASSISTED APPENDECTOMY;  Surgeon: Candyce Champagne, MD;  Location: [facility] ORS;  Service: General;  Laterality: N/A;   CARDIAC CATHETERIZATION N/A 10/18/2015   Procedure: Left Heart Cath and Coronary Angiography;  Surgeon: Odie Benne, MD;  Location: [facility] INVASIVE CV LAB;  Service: Cardiovascular;  Laterality: N/A;   CARDIOVERSION N/A 11/24/2015   Procedure: CARDIOVERSION;  Surgeon: Loyde Rule, MD;  Location: [facility]  ENDOSCOPY;  Service: Cardiovascular;  Laterality: N/A;   CARDIOVERSION N/A 12/20/2022   Procedure: CARDIOVERSION;  Surgeon: Bridgette Campus, MD;  Location: [facility] INVASIVE CV LAB;  Service: Cardiovascular;  Laterality: N/A;   CYSTOSCOPY WITH STENT PLACEMENT Bilateral 03/07/2019   Procedure: CYSTOSCOPY WITH BILATERAL FIREFLY INJECTION;  Surgeon: Andrez Banker, MD;  Location: [facility] ORS;  Service: Urology;  Laterality: Bilateral;   GASTROSTOMY TUBE PLACEMENT  07/04/2004   IR - G tube for tonsilar cancer   IR ANGIO INTRA EXTRACRAN SEL COM CAROTID INNOMINATE UNI R MOD SED  03/09/2019   IR ANGIO VERTEBRAL SEL VERTEBRAL UNI R MOD SED  03/09/2019   IR CT HEAD LTD  03/09/2019   IR PERCUTANEOUS ART THROMBECTOMY/INFUSION INTRACRANIAL INC DIAG ANGIO  03/09/2019   IR RADIOLOGIST EVAL & MGMT  12/18/2018   LAMINECTOMY     C5/placement of steel plate   LOOP RECORDER INSERTION N/A 06/19/2023   Procedure: LOOP RECORDER INSERTION;  Surgeon: Boyce Byes, MD;  Location: [facility] INVASIVE CV LAB;  Service: Cardiovascular;  Laterality: N/A;   NECK SURGERY  2003   replaced disk   RADIOLOGY WITH ANESTHESIA N/A 03/08/2019   Procedure: IR WITH ANESTHESIA;  Surgeon: Luellen Sages, MD;  Location: [facility] OR;  Service: Radiology;  Laterality: N/A;   TEE WITHOUT CARDIOVERSION N/A 10/15/2015   Procedure: TRANSESOPHAGEAL ECHOCARDIOGRAM (TEE);  Surgeon: Darlis Eisenmenger, MD;  Location: [facility] ENDOSCOPY;  Service: Cardiovascular;  Laterality: N/A;   TEE WITHOUT CARDIOVERSION N/A 11/24/2015   Procedure: TRANSESOPHAGEAL ECHOCARDIOGRAM (TEE);  Surgeon: Loyde Rule,  MD;  Location: [facility] ENDOSCOPY;  Service: Cardiovascular;  Laterality: N/A;   TEE WITHOUT CARDIOVERSION N/A 12/20/2022   Procedure: TRANSESOPHAGEAL ECHOCARDIOGRAM;  Surgeon: Bridgette Campus, MD;  Location: [facility] INVASIVE CV LAB;  Service: Cardiovascular;  Laterality: N/A;   XI ROBOTIC ASSISTED COLOSTOMY TAKEDOWN N/A 03/07/2019   Procedure: XI ROBOTIC ASSISTED LOW ANTERIOR RESECTION, RIGID  PROCTOSCOPY;  Surgeon: Candyce Champagne, MD;  Location: [facility] ORS;  Service: General;  Laterality: N/A;   Patient Active Problem List   Diagnosis Date Noted   Carotid occlusion, left 10/23/2023   ICH (intracerebral hemorrhage) (HCC) 08/11/2023   Carotid artery stenosis    Atrial flutter (HCC) 12/19/2022   Essential hypertension 04/22/2021   Hypokalemia 03/12/2019   Expressive aphasia 03/10/2019   Dysphagia 03/10/2019   Middle cerebral artery embolism, left 03/09/2019   Diverticular stricture (HCC) 03/07/2019   Bacteria in urine    Chronic atrial fibrillation (HCC) 12/02/2018   Current use of long term anticoagulation 12/02/2018   UTI (urinary tract infection) 11/29/2018   Hyponatremia 11/29/2018   PAF (paroxysmal atrial fibrillation) (HCC) 11/29/2018   Diverticulitis of large intestine with abscess 09/23/2018   Dyslipidemia 12/05/2017   CAD (coronary artery disease), native coronary artery 12/06/2015   SOB (shortness of breath)    NICM (nonischemic cardiomyopathy) (HCC)    Thyroid  activity decreased    Hypothyroidism 08/12/2013   History of radiation therapy 05/30/2012   Smokes tobacco daily 05/30/2012   Cancer of tonsillar fossa (HCC) 11/27/2011   Tonsil cancer (HCC) 2005   History of chemotherapy 2005    ONSET DATE: 08/11/23  REFERRING DIAG: I61.0 (ICD-10-CM) - Nontraumatic subcortical hemorrhage of left cerebral hemisphere (HCC)   THERAPY DIAG:  No diagnosis found.  Rationale for Evaluation and Treatment: Rehabilitation  SUBJECTIVE:   SUBJECTIVE STATEMENT: Pt denies pain  Pt accompanied by: self  PERTINENT HISTORY:   68 yo male hospitalized 08/11/23 with R sided weakness. CT showed a large basal ganglia ICH with IVH extension. PMH includes:  A-fib, carotid artery stenosis, CAD, tonsil cancer, hypothyroidism, L MCA CVA.    PRECAUTIONS: Fall  WEIGHT BEARING RESTRICTIONS: No  PAIN:  Are you having pain? No  FALLS: Has patient fallen in last 6 months? No  LIVING  ENVIRONMENT: Lives with: lives alone has friends and ex wife checks in on him Lives in: House/apartment Stairs: no Has following equipment at home: Single point cane and Tour manager  PLOF: Independent  PATIENT GOALS: improve use of RUE  OBJECTIVE:  Note: Objective measures were completed at Evaluation unless otherwise noted.  HAND DOMINANCE: Right  ADLs: Overall ADLs: mod I with all basic ADLS Transfers/ambulation related to ADLs: Eating: uses RUE 20%, currently using non dominant LUE,  Grooming: using non dominant LUE UB Dressing: mod I difficulty with buttons LB Dressing: mod I Toileting: mod I with cane Bathing: has tub bench Tub Shower transfers: mod I  Equipment: Transfer tub bench  IADLs: Shopping: needs assist for transportation Light housekeeping: Pt is performing light cleaning, he has occasional assist Meal Prep: microwave meals primarily, does a little stove top Medication management: pt handles  Financial management: pt handles Handwriting: 90% legibility, handwriting is small   MOBILITY STATUS: mod I with cane  ACTIVITY TOLERANCE: Activity tolerance: standing  UPPER EXTREMITY ROM:  grossly WFLS  UPPER EXTREMITY MMT:     MMT Right eval Left eval  Shoulder flexion 3+/5 4+/5  Shoulder abduction    Shoulder adduction    Shoulder extension    Shoulder internal  rotation    Shoulder external rotation    Middle trapezius    Lower trapezius    Elbow flexion 4-/5 4-/5  Elbow extension 4-/5 4-/5  Wrist flexion    Wrist extension    Wrist ulnar deviation    Wrist radial deviation    Wrist pronation    Wrist supination    (Blank rows = not tested)  HAND FUNCTION: Grip strength: Right: 50 lbs; Left: 63 lbs  COORDINATION: 9 Hole Peg test: Right: 59.69 sec; Left: 29.35 sec  SENSATION:diminished in RUE  COGNITION: Overall cognitive status:unable to spell WORLD backwards correctly, recalls 2/3 words following a delay.  VISION ASSESSMENT: Not  tested- denies visual changes   OBSERVATIONS: Pleasant gentleman motivated to improve                                                                                                                              TREATMENT DATE: 12/26/23-Closed chain shoulder flexion, then diagonals each direction in seated, min v.c, focus on control. Standing to review yellow theraband exercises for shoulder extension and flexion, min v.c for RUE 15 reps each Pt performed home management activity to sweep items and dispose of them with supervision, min v.c for wide stance  Pt practiced picking items off of the floor with wide stance, and recommendation that pt holds onto counter, table or other stable surface when picking things up for safety. Handwriting activity, with pt demonstrating ability to write a short paragraph with 100% legibility and good letter size. Grooved pegs with RUE for increased fine motor coordination, min difficulty and min v.c for performance and positioning.   12/24/23 Discussed potential d/c next week and started checking goals. Pt in agreement. see goals below.  Reviewed previously issued red theraband exercises for biceps, triceps and shoulder horizontal abduction 10-15 reps each. Therapist added low range yellow theraband exercises for shoulder flexion and extension, min v.c and demonstration for strengthening bilateral UE's.  12/20/23-Pt drove himself to therapy today despite prior recommendation that pt discusses with MD and seeks MD clearance. Pt provided with driving eval contacts and therapist recommends that pt performs graduated driving program with someone if MD clears him.  Therapist discussed concerns regarding reaction time as pt has cognitive changes as well as RUE weakness/ decreased control which may impact reaction time. Pt verbalized understanding Alternating attention tasks on constant therapy in prep for driving: Alternating symbols: levels 4, 6, 8 with 100%  accuracy. closed chain shoulder flexion, then diagonals each direction for increased RUE control, min v.c for positioning. Pt ambulated 150 ft while carrying a simulated plate of food in right hand without drops.    12/12/23 Seated closed chain shoulder flexion, holding ball betwen palms then diagonals both directions, min v.c and facilitation for control. Red theraband exercises shoulder horizontal abduction, biceps curls, and ticeps extension, bilateral UE's for increased strength 10 reps each, min v.c  Dynamic functional reaching in standing to retrieve items from  lower surface and place in low and middle shelves, supervision, min v.c, no LOB. Stacking blocks/ unstacking blocks with LUE for increased fine motor coordination and RUE control, min difficulty, v.c  Gripper set at level 3 to pick up 1/3 of blocks with RUE, min difficulty/ v.c Writing activities with foam grip initally, printing alphabet with v.c to print in capitals to meet line, mi v.c Pt with improved legibility and letter size following practice, with min v.c Pt required min assist for spelling. End of session pt was able to complete a sentence using regular pen with good legibility and letter size.  12/11/23-Seated closed chain shoulder flexion, then diagonals both directions, min v.c and facilitation for control. Quadraped activities on mat for core stability and UE strength, cat and cow, then liftin alternate UE, then lifting alternate LE, ball added under abdomen for increased stability, min-mod facilitation and v.c 10 reps each Fine motor coordination task placing grooved pegs then removing with in hand manipulation, min difficulty/ v.c Gripper set at level 2 sustained grip to pick up 1 inch blocks with RUE, min v.c Standing to perfrom functional reaching to place graded clothespins, 1-8# on targets with RUE, while retrieving from lower surface, supervision and min v.c UBE x 6 mins level 3 for conditioning.   12/05/23-Fine  motor coordination task to put various sized pegs into pegboard, min v.c to avoid compensatory shoulder hike, removing pegs with in hand manipulation min difficulty/ v.c, Pt with improved dexterity today. Reveiwed red theraband HEP, ( shoulder horizontal abduction, biceps curls, triceps extension)10-15 reps each, min v.c for positioning). Pt performed shoulder extension  10 reps bilateral UE's in standing with red band however pt requires mod v.c for positioning, so this was not issued for home. Therapist checked short term goals for PN.  12/03/23 Standing to copy small peg design on vertical surface, mod difficulty/ drops and min v.c, increased time required, min errors for design, min v.c for in hand manipulation Ambulating 125 ft while carrying plate in right hand while using cane in left min v.c no drops. Closed chain shoulder flexion, then digaonals each direction with left and right UE's, min v.c for positioning      PATIENT EDUCATION: Education details:see above  Education method: Explanation, Demonstration, Tactile cues, Verbal cues, Education comprehension: verbalized understanding, returned demonstration, and verbal cues required  HOME EXERCISE PROGRAM: beginning coordination 3/26 11/09/23  Cane HEP 4/10- biceps triceps with yellow band, shoulder flexion with paper towel roll  11/27/23- memory compensations  12/24/23- red theraband for biceps, triceps and horizonatl abduction, yellow t-band for shoulder flexion/ extension GOALS: Goals reviewed with patient? Yes  SHORT TERM GOALS: Target date: 12/01/23  I with inital HEP Goal status: met for cane, beginning theraband 12/05/23  2.  Pt will report feeding himself at least 50% of the time with RUE. Baseline: uses 20% x Goal status:  met, per pt report 78% 11/28/23  3.  Pt will demonstrate improved RUE fine motor coordination for ADLs as evidenced by decreasing 9 hole peg test score to 52 secs or less Goal status: met , 43.26  secs  4.  Pt will report consistently brushing his teeth with RUE Goal status: partially met performs at least 90% of the time 12/24/23  5.  I with memory compensation strategies Goal status: met, issued 11/28/23  6.  Pt will report that he has resumed moderate level home management mod I Goal status:  met-cooking, Pt reports washing dishes, and performing laundry, sweeping  LONG TERM GOALS: Target date: 01/23/24  I with updated HEP Goal status: ongoing, 12/12/23  2.  Pt will demonstrate improved RUE fine motor coordination for ADLs as evidenced by decreasing 9 hole peg test score to 48 secs or less Goal status: met, 42.57 secs 12/24/23  3.  Pt will demonstrate ability to carry a plate in RUE without spills. Goal status:met 12/20/23  4.  Pt will write a short paragraph with 100% legibility and minimal decrease in letter size. Goal status: met, pt demonstrates ability to perform using pen with foam grip. 12/26/23. 5.  Pt will resume use of RUE as dominant hand at least 75% of the time for ADLs/ IADLs. Goal status:met, 80% 12/24/23  6. Pt will retrieve a 3 lbs object from eye level shelf using RUE with good control  Goal status:met, pt performed x 5 reps 12/24/23  ASSESSMENT:  CLINICAL IMPRESSION: Pt is progressing towards goals. He is now perfroming mod complex home management activities mod I. He demonstrates improved RUE strength and control. PERFORMANCE DEFICITS: in functional skills including ADLs, IADLs, coordination, dexterity, sensation, strength, flexibility, Fine motor control, Gross motor control, mobility, balance, endurance, decreased knowledge of precautions, decreased knowledge of use of DME, and UE functional use, cognitive skills including attention, memory, problem solving, safety awareness, and thought, and psychosocial skills including coping strategies, environmental adaptation, habits, interpersonal interactions, and routines and behaviors.   IMPAIRMENTS: are limiting  patient from ADLs, IADLs, play, leisure, and social participation.   CO-MORBIDITIES: may have co-morbidities  that affects occupational performance. Patient will benefit from skilled OT to address above impairments and improve overall function.  MODIFICATION OR ASSISTANCE TO COMPLETE EVALUATION: No modification of tasks or assist necessary to complete an evaluation.  OT OCCUPATIONAL PROFILE AND HISTORY: Detailed assessment: Review of records and additional review of physical, cognitive, psychosocial history related to current functional performance.  CLINICAL DECISION MAKING: LOW - limited treatment options, no task modification necessary  REHAB POTENTIAL: Good  EVALUATION COMPLEXITY: Low    PLAN:  OT FREQUENCY: 2x/week  OT DURATION: 12 weeks  PLANNED INTERVENTIONS: 97168 OT Re-evaluation, 97535 self care/ADL training, 82956 therapeutic exercise, 97530 therapeutic activity, 97112 neuromuscular re-education, 97140 manual therapy, 97113 aquatic therapy, 97035 ultrasound, 97018 paraffin, 21308 moist heat, 97010 cryotherapy, 97034 contrast bath, 97014 electrical stimulation unattended, 97129 Cognitive training (first 15 min), 65784 Cognitive training(each additional 15 min), 69629 Orthotics management and training, 52841 Splinting (initial encounter), passive range of motion, balance training, functional mobility training, energy conservation, coping strategies training, patient/family education, and DME and/or AE instructions  RECOMMENDED OTHER SERVICES: n/a  CONSULTED AND AGREED WITH PLAN OF CARE: Patient  PLAN FOR NEXT SESSION:  check remaining goals, anticipate d/c next week.   Haydn Cush, OTR/L 01/02/2024, 8:54 AM

## 2024-01-02 NOTE — Therapy (Signed)
 OUTPATIENT OCCUPATIONAL THERAPY NEURO TREATMENT/ Discharge  Patient Name: Adrian [name] MRN: [id number] DOB:Jun 29, 1956, 68 y.o., male Today's Date: 01/02/2024  PCP: Dr. Darren Em REFERRING PROVIDER: Dr. Sharl Davies OCCUPATIONAL THERAPY DISCHARGE SUMMARY    Current functional level related to goals / functional outcomes: Pt made excellent overall progress. He met all long term goals.   Remaining deficits: decreased strength, decreased coordiantion, decreased balance, mild cognitive deficits   Education / Equipment: Pt was educated regarding HEP. Pt returned demonstration. Therapist recommends pt seeks clearance from his MD prior to return to driving. Pt was provided with driving eval information should he wish to pursue.  Patient agrees to discharge. Patient goals were met. Patient is being discharged due to meeting the stated rehab goals..    END OF SESSION:  OT End of Session - 01/02/24 1054     Visit Number 16    Number of Visits 25    Date for OT Re-Evaluation 01/23/24    Authorization Type Medicare    Authorization - Visit Number 16    Progress Note Due on Visit 20    OT Start Time 1056    OT Stop Time 1145    OT Time Calculation (min) 49 min    Activity Tolerance Patient tolerated treatment well    Behavior During Therapy [facility] for tasks assessed/performed                       Past Medical History:  Diagnosis Date   AF (paroxysmal atrial fibrillation) (HCC) 11/29/2018   Atrial flutter (HCC) 10/12/2015   a. TEE 3/17 with ? LAA clot-->s/p TEE/DCCV 11/24/2015; s/p DCCV 12/2022;s/p ablation 06/2023   CAD (coronary artery disease), native coronary artery 12/06/2015   a. 10/2015 MV: EF  37%, reversible defect inferior apex, intermediate risk findings; b. 10/2015 Cath: 20% mid RCA.   Carotid artery stenosis    1-39% right ICA stenosis and occluded left ICA   Colonic diverticular abscess    Diverticulitis    History of chemotherapy 2005   Cisplatin    Hypothyroidism    NICM (nonischemic cardiomyopathy) (HCC) 10/14/2015   a. Tachy mediated?;  b. Echo 3/17 - Mild concentric LVH, EF 30-35%, anteroseptal, anterior, anterolateral, apical anterior, lateral hypokinesis, trivial MR, mild to moderately reduced RVSF; c. LHC 3/17 - mRCA 20%   Radiation NOv.3,2005-Dec. 15, 2005   6810 cGy in 30 fractions   Tonsil cancer [facility]) 2005   Dr Ava Lei.  XRT   Past Surgical History:  Procedure Laterality Date   A-FLUTTER ABLATION N/A 06/19/2023   Procedure: A-FLUTTER ABLATION;  Surgeon: Boyce Byes, MD;  Location: [facility] INVASIVE CV LAB;  Service: Cardiovascular;  Laterality: N/A;   APPENDECTOMY N/A 03/07/2019   Procedure: ROBOTIC ASSISTED APPENDECTOMY;  Surgeon: Candyce Champagne, MD;  Location: [facility] ORS;  Service: General;  Laterality: N/A;   CARDIAC CATHETERIZATION N/A 10/18/2015   Procedure: Left Heart Cath and Coronary Angiography;  Surgeon: Odie Benne, MD;  Location: [facility] INVASIVE CV LAB;  Service: Cardiovascular;  Laterality: N/A;   CARDIOVERSION N/A 11/24/2015   Procedure: CARDIOVERSION;  Surgeon: Loyde Rule, MD;  Location: [facility] ENDOSCOPY;  Service: Cardiovascular;  Laterality: N/A;   CARDIOVERSION N/A 12/20/2022   Procedure: CARDIOVERSION;  Surgeon: Bridgette Campus, MD;  Location: [facility] INVASIVE CV LAB;  Service: Cardiovascular;  Laterality: N/A;   CYSTOSCOPY WITH STENT PLACEMENT Bilateral 03/07/2019   Procedure: CYSTOSCOPY WITH BILATERAL FIREFLY INJECTION;  Surgeon: Andrez Banker, MD;  Location: [facility] ORS;  Service: Urology;  Laterality: Bilateral;   GASTROSTOMY TUBE PLACEMENT  07/04/2004   IR - G tube for tonsilar cancer   IR ANGIO INTRA EXTRACRAN SEL COM CAROTID INNOMINATE UNI R MOD SED  03/09/2019   IR ANGIO VERTEBRAL SEL VERTEBRAL UNI R MOD SED  03/09/2019   IR CT HEAD LTD  03/09/2019   IR PERCUTANEOUS ART THROMBECTOMY/INFUSION INTRACRANIAL INC DIAG ANGIO  03/09/2019   IR RADIOLOGIST EVAL & MGMT  12/18/2018   LAMINECTOMY     C5/placement of  steel plate   LOOP RECORDER INSERTION N/A 06/19/2023   Procedure: LOOP RECORDER INSERTION;  Surgeon: Boyce Byes, MD;  Location: [facility] INVASIVE CV LAB;  Service: Cardiovascular;  Laterality: N/A;   NECK SURGERY  2003   replaced disk   RADIOLOGY WITH ANESTHESIA N/A 03/08/2019   Procedure: IR WITH ANESTHESIA;  Surgeon: Luellen Sages, MD;  Location: [facility] OR;  Service: Radiology;  Laterality: N/A;   TEE WITHOUT CARDIOVERSION N/A 10/15/2015   Procedure: TRANSESOPHAGEAL ECHOCARDIOGRAM (TEE);  Surgeon: Darlis Eisenmenger, MD;  Location: [facility] ENDOSCOPY;  Service: Cardiovascular;  Laterality: N/A;   TEE WITHOUT CARDIOVERSION N/A 11/24/2015   Procedure: TRANSESOPHAGEAL ECHOCARDIOGRAM (TEE);  Surgeon: Loyde Rule, MD;  Location: [facility] ENDOSCOPY;  Service: Cardiovascular;  Laterality: N/A;   TEE WITHOUT CARDIOVERSION N/A 12/20/2022   Procedure: TRANSESOPHAGEAL ECHOCARDIOGRAM;  Surgeon: Bridgette Campus, MD;  Location: [facility] INVASIVE CV LAB;  Service: Cardiovascular;  Laterality: N/A;   XI ROBOTIC ASSISTED COLOSTOMY TAKEDOWN N/A 03/07/2019   Procedure: XI ROBOTIC ASSISTED LOW ANTERIOR RESECTION, RIGID PROCTOSCOPY;  Surgeon: Candyce Champagne, MD;  Location: [facility] ORS;  Service: General;  Laterality: N/A;   Patient Active Problem List   Diagnosis Date Noted   Carotid occlusion, left 10/23/2023   ICH (intracerebral hemorrhage) (HCC) 08/11/2023   Carotid artery stenosis    Atrial flutter (HCC) 12/19/2022   Essential hypertension 04/22/2021   Hypokalemia 03/12/2019   Expressive aphasia 03/10/2019   Dysphagia 03/10/2019   Middle cerebral artery embolism, left 03/09/2019   Diverticular stricture (HCC) 03/07/2019   Bacteria in urine    Chronic atrial fibrillation (HCC) 12/02/2018   Current use of long term anticoagulation 12/02/2018   UTI (urinary tract infection) 11/29/2018   Hyponatremia 11/29/2018   PAF (paroxysmal atrial fibrillation) (HCC) 11/29/2018   Diverticulitis of large intestine with abscess 09/23/2018    Dyslipidemia 12/05/2017   CAD (coronary artery disease), native coronary artery 12/06/2015   SOB (shortness of breath)    NICM (nonischemic cardiomyopathy) (HCC)    Thyroid  activity decreased    Hypothyroidism 08/12/2013   History of radiation therapy 05/30/2012   Smokes tobacco daily 05/30/2012   Cancer of tonsillar fossa (HCC) 11/27/2011   Tonsil cancer (HCC) 2005   History of chemotherapy 2005    ONSET DATE: 08/11/23  REFERRING DIAG: I61.0 (ICD-10-CM) - Nontraumatic subcortical hemorrhage of left cerebral hemisphere (HCC)   THERAPY DIAG:  Muscle weakness (generalized)  Other abnormalities of gait and mobility  Other lack of coordination  Unsteadiness on feet  Frontal lobe and executive function deficit  Attention and concentration deficit  Rationale for Evaluation and Treatment: Rehabilitation  SUBJECTIVE:   SUBJECTIVE STATEMENT: Pt agrees with d/c  Pt accompanied by: self  PERTINENT HISTORY:   68 yo male hospitalized 08/11/23 with R sided weakness. CT showed a large basal ganglia ICH with IVH extension. PMH includes:  A-fib, carotid artery stenosis, CAD, tonsil cancer, hypothyroidism, L MCA CVA.    PRECAUTIONS: Fall  WEIGHT BEARING RESTRICTIONS: No  PAIN:  Are you having pain? No  FALLS: Has patient fallen in last 6 months? No  LIVING ENVIRONMENT: Lives with: lives alone has friends and ex wife checks in on him Lives in: House/apartment Stairs: no Has following equipment at home: Single point cane and Tour manager  PLOF: Independent  PATIENT GOALS: improve use of RUE  OBJECTIVE:  Note: Objective measures were completed at Evaluation unless otherwise noted.  HAND DOMINANCE: Right  ADLs: Overall ADLs: mod I with all basic ADLS Transfers/ambulation related to ADLs: Eating: uses RUE 20%, currently using non dominant LUE,  Grooming: using non dominant LUE UB Dressing: mod I difficulty with buttons LB Dressing: mod I Toileting: mod I with  cane Bathing: has tub bench Tub Shower transfers: mod I  Equipment: Transfer tub bench  IADLs: Shopping: needs assist for transportation Light housekeeping: Pt is performing light cleaning, he has occasional assist Meal Prep: microwave meals primarily, does a little stove top Medication management: pt handles  Financial management: pt handles Handwriting: 90% legibility, handwriting is small   MOBILITY STATUS: mod I with cane  ACTIVITY TOLERANCE: Activity tolerance: standing  UPPER EXTREMITY ROM:  grossly WFLS  UPPER EXTREMITY MMT:     MMT Right eval Left eval  Shoulder flexion 3+/5 4+/5  Shoulder abduction    Shoulder adduction    Shoulder extension    Shoulder internal rotation    Shoulder external rotation    Middle trapezius    Lower trapezius    Elbow flexion 4-/5 4-/5  Elbow extension 4-/5 4-/5  Wrist flexion    Wrist extension    Wrist ulnar deviation    Wrist radial deviation    Wrist pronation    Wrist supination    (Blank rows = not tested)  HAND FUNCTION: Grip strength: Right: 50 lbs; Left: 63 lbs  COORDINATION: 9 Hole Peg test: Right: 59.69 sec; Left: 29.35 sec  SENSATION:diminished in RUE  COGNITION: Overall cognitive status:unable to spell WORLD backwards correctly, recalls 2/3 words following a delay.  VISION ASSESSMENT: Not tested- denies visual changes   OBSERVATIONS: Pleasant gentleman motivated to improve                                                                                                                              TREATMENT DATE: 01/02/24-Therapist checked progress towards remaining goals. Review of red theraband exercises, 10-15 reps each, min v.c to initiate then pt returned demonstration. Gripper set at level 3 to pick up 1/2 the container of 1 inch blocks, for sustained grip, min difficulty Placing grooved pegs into pegboard with RUE, then removing with in hand manipulation, min difficulty/ v.c UBE x 6 mins level 2  for conditioning.  12/26/23-Closed chain shoulder flexion, then diagonals each direction in seated, min v.c, focus on control. Standing to review yellow theraband exercises for shoulder extension and flexion, min v.c for RUE 15 reps each Pt performed home management activity to sweep items and dispose  of them with supervision, min v.c for wide stance  Pt practiced picking items off of the floor with wide stance, and recommendation that pt holds onto counter, table or other stable surface when picking things up for safety. Handwriting activity, with pt demonstrating ability to write a short paragraph with 100% legibility and good letter size. Grooved pegs with RUE for increased fine motor coordination, min difficulty and min v.c for performance and positioning.   12/24/23 Discussed potential d/c next week and started checking goals. Pt in agreement. see goals below.  Reviewed previously issued red theraband exercises for biceps, triceps and shoulder horizontal abduction 10-15 reps each. Therapist added low range yellow theraband exercises for shoulder flexion and extension, min v.c and demonstration for strengthening bilateral UE's.  12/20/23-Pt drove himself to therapy today despite prior recommendation that pt discusses with MD and seeks MD clearance. Pt provided with driving eval contacts and therapist recommends that pt performs graduated driving program with someone if MD clears him.  Therapist discussed concerns regarding reaction time as pt has cognitive changes as well as RUE weakness/ decreased control which may impact reaction time. Pt verbalized understanding Alternating attention tasks on constant therapy in prep for driving: Alternating symbols: levels 4, 6, 8 with 100% accuracy. closed chain shoulder flexion, then diagonals each direction for increased RUE control, min v.c for positioning. Pt ambulated 150 ft while carrying a simulated plate of food in right hand without  drops.    12/12/23 Seated closed chain shoulder flexion, holding ball betwen palms then diagonals both directions, min v.c and facilitation for control. Red theraband exercises shoulder horizontal abduction, biceps curls, and ticeps extension, bilateral UE's for increased strength 10 reps each, min v.c  Dynamic functional reaching in standing to retrieve items from lower surface and place in low and middle shelves, supervision, min v.c, no LOB. Stacking blocks/ unstacking blocks with LUE for increased fine motor coordination and RUE control, min difficulty, v.c  Gripper set at level 3 to pick up 1/3 of blocks with RUE, min difficulty/ v.c Writing activities with foam grip initally, printing alphabet with v.c to print in capitals to meet line, mi v.c Pt with improved legibility and letter size following practice, with min v.c Pt required min assist for spelling. End of session pt was able to complete a sentence using regular pen with good legibility and letter size.  12/11/23-Seated closed chain shoulder flexion, then diagonals both directions, min v.c and facilitation for control. Quadraped activities on mat for core stability and UE strength, cat and cow, then liftin alternate UE, then lifting alternate LE, ball added under abdomen for increased stability, min-mod facilitation and v.c 10 reps each Fine motor coordination task placing grooved pegs then removing with in hand manipulation, min difficulty/ v.c Gripper set at level 2 sustained grip to pick up 1 inch blocks with RUE, min v.c Standing to perfrom functional reaching to place graded clothespins, 1-8# on targets with RUE, while retrieving from lower surface, supervision and min v.c UBE x 6 mins level 3 for conditioning.   12/05/23-Fine motor coordination task to put various sized pegs into pegboard, min v.c to avoid compensatory shoulder hike, removing pegs with in hand manipulation min difficulty/ v.c, Pt with improved dexterity  today. Reveiwed red theraband HEP, ( shoulder horizontal abduction, biceps curls, triceps extension)10-15 reps each, min v.c for positioning). Pt performed shoulder extension  10 reps bilateral UE's in standing with red band however pt requires mod v.c for positioning, so this was not  issued for home. Therapist checked short term goals for PN.  12/03/23 Standing to copy small peg design on vertical surface, mod difficulty/ drops and min v.c, increased time required, min errors for design, min v.c for in hand manipulation Ambulating 125 ft while carrying plate in right hand while using cane in left min v.c no drops. Closed chain shoulder flexion, then digaonals each direction with left and right UE's, min v.c for positioning      PATIENT EDUCATION: Education details: progress towards goals, review of  Education method: Explanation, Demonstration, Tactile cues, Verbal cues, Education comprehension: verbalized understanding, returned demonstration, and verbal cues required  HOME EXERCISE PROGRAM: beginning coordination 3/26 11/09/23  Cane HEP 4/10- biceps triceps with yellow band, shoulder flexion with paper towel roll  11/27/23- memory compensations  12/24/23- red theraband for biceps, triceps and horizonatl abduction, yellow t-band for shoulder flexion/ extension GOALS: Goals reviewed with patient? Yes  SHORT TERM GOALS: Target date: 12/01/23  I with inital HEP Goal status: met for cane, beginning theraband 12/05/23  2.  Pt will report feeding himself at least 50% of the time with RUE. Baseline: uses 20% x Goal status:  met, per pt report 78% 11/28/23  3.  Pt will demonstrate improved RUE fine motor coordination for ADLs as evidenced by decreasing 9 hole peg test score to 52 secs or less Goal status: met , 43.26 secs  4.  Pt will report consistently brushing his teeth with RUE Goal status: met, 01/02/24  5.  I with memory compensation strategies Goal status: met, issued 11/28/23  6.   Pt will report that he has resumed moderate level home management mod I Goal status:  met-cooking, Pt reports washing dishes, and performing laundry, sweeping   LONG TERM GOALS: Target date: 01/23/24  I with updated HEP Goal status: met, red theraband  01/02/24  2.  Pt will demonstrate improved RUE fine motor coordination for ADLs as evidenced by decreasing 9 hole peg test score to 48 secs or less Goal status: met, 42.57 secs 12/24/23  3.  Pt will demonstrate ability to carry a plate in RUE without spills. Goal status:met 12/20/23  4.  Pt will write a short paragraph with 100% legibility and minimal decrease in letter size. Goal status: met, pt demonstrates ability to perform using pen with foam grip. 12/26/23. 5.  Pt will resume use of RUE as dominant hand at least 75% of the time for ADLs/ IADLs. Goal status:met, 80% 12/24/23  6. Pt will retrieve a 3 lbs object from eye level shelf using RUE with good control  Goal status:met, pt performed x 5 reps 12/24/23  ASSESSMENT:  CLINICAL IMPRESSION: Pt made good overall progress. He achieved all long and short term goals. PERFORMANCE DEFICITS: in functional skills including ADLs, IADLs, coordination, dexterity, sensation, strength, flexibility, Fine motor control, Gross motor control, mobility, balance, endurance, decreased knowledge of precautions, decreased knowledge of use of DME, and UE functional use, cognitive skills including attention, memory, problem solving, safety awareness, and thought, and psychosocial skills including coping strategies, environmental adaptation, habits, interpersonal interactions, and routines and behaviors.   IMPAIRMENTS: are limiting patient from ADLs, IADLs, play, leisure, and social participation.   CO-MORBIDITIES: may have co-morbidities  that affects occupational performance. Patient will benefit from skilled OT to address above impairments and improve overall function.  MODIFICATION OR ASSISTANCE TO COMPLETE  EVALUATION: No modification of tasks or assist necessary to complete an evaluation.  OT OCCUPATIONAL PROFILE AND HISTORY: Detailed assessment: Review of records  and additional review of physical, cognitive, psychosocial history related to current functional performance.  CLINICAL DECISION MAKING: LOW - limited treatment options, no task modification necessary  REHAB POTENTIAL: Good  EVALUATION COMPLEXITY: Low    PLAN:  OT FREQUENCY: 2x/week  OT DURATION: 12 weeks  PLANNED INTERVENTIONS: 97168 OT Re-evaluation, 97535 self care/ADL training, 78295 therapeutic exercise, 97530 therapeutic activity, 97112 neuromuscular re-education, 97140 manual therapy, 97113 aquatic therapy, 97035 ultrasound, 97018 paraffin, 62130 moist heat, 97010 cryotherapy, 97034 contrast bath, 97014 electrical stimulation unattended, 97129 Cognitive training (first 15 min), 86578 Cognitive training(each additional 15 min), 46962 Orthotics management and training, 95284 Splinting (initial encounter), passive range of motion, balance training, functional mobility training, energy conservation, coping strategies training, patient/family education, and DME and/or AE instructions  RECOMMENDED OTHER SERVICES: n/a  CONSULTED AND AGREED WITH PLAN OF CARE: Patient  PLAN FOR NEXT SESSION:  d/c OT Ezana Hubbert, OTR/L 01/02/2024, 11:41 AM

## 2024-01-09 ENCOUNTER — Ambulatory Visit: Attending: Physical Medicine & Rehabilitation

## 2024-01-09 ENCOUNTER — Other Ambulatory Visit: Payer: Self-pay

## 2024-01-09 DIAGNOSIS — R262 Difficulty in walking, not elsewhere classified: Secondary | ICD-10-CM | POA: Diagnosis present

## 2024-01-09 DIAGNOSIS — R2689 Other abnormalities of gait and mobility: Secondary | ICD-10-CM | POA: Diagnosis present

## 2024-01-09 DIAGNOSIS — R278 Other lack of coordination: Secondary | ICD-10-CM

## 2024-01-09 DIAGNOSIS — R2681 Unsteadiness on feet: Secondary | ICD-10-CM | POA: Diagnosis present

## 2024-01-09 DIAGNOSIS — M6281 Muscle weakness (generalized): Secondary | ICD-10-CM

## 2024-01-09 NOTE — Therapy (Signed)
 OUTPATIENT PHYSICAL THERAPY TREATMENT    Patient Name: Adrian [name] MRN: [id number] DOB:[birth year], 68 y.o., male Today's Date: 01/09/2024      END OF SESSION:  PT End of Session - 01/09/24 1004     Visit Number 17    Date for PT Re-Evaluation 02/27/24    Authorization Type MCR    Progress Note Due on Visit 24    PT Start Time 0933    PT Stop Time 1015    PT Time Calculation (min) 42 min    Activity Tolerance Patient tolerated treatment well    Behavior During Therapy [facility] for tasks assessed/performed                             Past Medical History:  Diagnosis Date   AF (paroxysmal atrial fibrillation) (HCC) 11/29/2018   Atrial flutter (HCC) 10/12/2015   a. TEE 3/17 with ? LAA clot-->s/p TEE/DCCV 11/24/2015; s/p DCCV 12/2022;s/p ablation 06/2023   CAD (coronary artery disease), native coronary artery 12/06/2015   a. 10/2015 MV: EF  37%, reversible defect inferior apex, intermediate risk findings; b. 10/2015 Cath: 20% mid RCA.   Carotid artery stenosis    1-39% right ICA stenosis and occluded left ICA   Colonic diverticular abscess    Diverticulitis    History of chemotherapy 2005   Cisplatin   Hypothyroidism    NICM (nonischemic cardiomyopathy) (HCC) 10/14/2015   a. Tachy mediated?;  b. Echo 3/17 - Mild concentric LVH, EF 30-35%, anteroseptal, anterior, anterolateral, apical anterior, lateral hypokinesis, trivial MR, mild to moderately reduced RVSF; c. LHC 3/17 - mRCA 20%   Radiation NOv.3,2005-Dec. 15, 2005   6810 cGy in 30 fractions   Tonsil cancer [facility]) 2005   Dr Ava Lei.  XRT   Past Surgical History:  Procedure Laterality Date   A-FLUTTER ABLATION N/A 06/19/2023   Procedure: A-FLUTTER ABLATION;  Surgeon: Boyce Byes, MD;  Location: [facility] INVASIVE CV LAB;  Service: Cardiovascular;  Laterality: N/A;   APPENDECTOMY N/A 03/07/2019   Procedure: ROBOTIC ASSISTED APPENDECTOMY;  Surgeon: Candyce Champagne, MD;  Location: [facility] ORS;  Service: General;   Laterality: N/A;   CARDIAC CATHETERIZATION N/A 10/18/2015   Procedure: Left Heart Cath and Coronary Angiography;  Surgeon: Odie Benne, MD;  Location: [facility] INVASIVE CV LAB;  Service: Cardiovascular;  Laterality: N/A;   CARDIOVERSION N/A 11/24/2015   Procedure: CARDIOVERSION;  Surgeon: Loyde Rule, MD;  Location: [facility] ENDOSCOPY;  Service: Cardiovascular;  Laterality: N/A;   CARDIOVERSION N/A 12/20/2022   Procedure: CARDIOVERSION;  Surgeon: Bridgette Campus, MD;  Location: [facility] INVASIVE CV LAB;  Service: Cardiovascular;  Laterality: N/A;   CYSTOSCOPY WITH STENT PLACEMENT Bilateral 03/07/2019   Procedure: CYSTOSCOPY WITH BILATERAL FIREFLY INJECTION;  Surgeon: Andrez Banker, MD;  Location: [facility] ORS;  Service: Urology;  Laterality: Bilateral;   GASTROSTOMY TUBE PLACEMENT  07/04/2004   IR - G tube for tonsilar cancer   IR ANGIO INTRA EXTRACRAN SEL COM CAROTID INNOMINATE UNI R MOD SED  03/09/2019   IR ANGIO VERTEBRAL SEL VERTEBRAL UNI R MOD SED  03/09/2019   IR CT HEAD LTD  03/09/2019   IR PERCUTANEOUS ART THROMBECTOMY/INFUSION INTRACRANIAL INC DIAG ANGIO  03/09/2019   IR RADIOLOGIST EVAL & MGMT  12/18/2018   LAMINECTOMY     C5/placement of steel plate   LOOP RECORDER INSERTION N/A 06/19/2023   Procedure: LOOP RECORDER INSERTION;  Surgeon: Boyce Byes, MD;  Location: [facility] INVASIVE  CV LAB;  Service: Cardiovascular;  Laterality: N/A;   NECK SURGERY  2003   replaced disk   RADIOLOGY WITH ANESTHESIA N/A 03/08/2019   Procedure: IR WITH ANESTHESIA;  Surgeon: Luellen Sages, MD;  Location: [facility] OR;  Service: Radiology;  Laterality: N/A;   TEE WITHOUT CARDIOVERSION N/A 10/15/2015   Procedure: TRANSESOPHAGEAL ECHOCARDIOGRAM (TEE);  Surgeon: Darlis Eisenmenger, MD;  Location: [facility] ENDOSCOPY;  Service: Cardiovascular;  Laterality: N/A;   TEE WITHOUT CARDIOVERSION N/A 11/24/2015   Procedure: TRANSESOPHAGEAL ECHOCARDIOGRAM (TEE);  Surgeon: Loyde Rule, MD;  Location: [facility] ENDOSCOPY;  Service: Cardiovascular;   Laterality: N/A;   TEE WITHOUT CARDIOVERSION N/A 12/20/2022   Procedure: TRANSESOPHAGEAL ECHOCARDIOGRAM;  Surgeon: Bridgette Campus, MD;  Location: [facility] INVASIVE CV LAB;  Service: Cardiovascular;  Laterality: N/A;   XI ROBOTIC ASSISTED COLOSTOMY TAKEDOWN N/A 03/07/2019   Procedure: XI ROBOTIC ASSISTED LOW ANTERIOR RESECTION, RIGID PROCTOSCOPY;  Surgeon: Candyce Champagne, MD;  Location: [facility] ORS;  Service: General;  Laterality: N/A;   Patient Active Problem List   Diagnosis Date Noted   Carotid occlusion, left 10/23/2023   ICH (intracerebral hemorrhage) (HCC) 08/11/2023   Carotid artery stenosis    Atrial flutter (HCC) 12/19/2022   Essential hypertension 04/22/2021   Hypokalemia 03/12/2019   Expressive aphasia 03/10/2019   Dysphagia 03/10/2019   Middle cerebral artery embolism, left 03/09/2019   Diverticular stricture (HCC) 03/07/2019   Bacteria in urine    Chronic atrial fibrillation (HCC) 12/02/2018   Current use of long term anticoagulation 12/02/2018   UTI (urinary tract infection) 11/29/2018   Hyponatremia 11/29/2018   PAF (paroxysmal atrial fibrillation) (HCC) 11/29/2018   Diverticulitis of large intestine with abscess 09/23/2018   Dyslipidemia 12/05/2017   CAD (coronary artery disease), native coronary artery 12/06/2015   SOB (shortness of breath)    NICM (nonischemic cardiomyopathy) (HCC)    Thyroid  activity decreased    Hypothyroidism 08/12/2013   History of radiation therapy 05/30/2012   Smokes tobacco daily 05/30/2012   Cancer of tonsillar fossa (HCC) 11/27/2011   Tonsil cancer (HCC) 2005   History of chemotherapy 2005    PCP: Glean Lamy MD   REFERRING PROVIDER: Genetta Kenning, MD  REFERRING DIAG: I61.0 (ICD-10-CM) - Nontraumatic subcortical hemorrhage of left cerebral hemisphere [facility])  THERAPY DIAG:  Muscle weakness (generalized)  Other abnormalities of gait and mobility  Unsteadiness on feet  Other lack of coordination  Difficulty in walking, not  elsewhere classified  Rationale for Evaluation and Treatment: Rehabilitation  ONSET DATE: 08/11/23  SUBJECTIVE:   SUBJECTIVE STATEMENT:  Doing OK no new complaints, [place] with my [place] today this afternoon.  PERTINENT HISTORY: 1.  History of left basal ganglia hemorrhage approximately 2 and half months ago.  He has completed inpatient rehabilitation as well as home health.  I do think he would be a great candidate for outpatient therapy.  He has gotten back to modified independent level but still has some mild weakness on the right side as well as fine motor issues and motor control issues.  His balance is also off.  He continues wear right AFO because of foot and ankle weakness.  Patient has some short-term memory issues as well Will make referral for outpatient PT OT and speech therapy Physical medicine rehab follow-up in 3 months  PAIN:  Are you having pain? No  0/10 today    PRECAUTIONS: Fall and Other: R hemi, has loop recorder   RED FLAGS: None   WEIGHT BEARING  RESTRICTIONS: No  FALLS:  Has patient fallen in last 6 months? No  LIVING ENVIRONMENT: Lives with: lives alone Lives in: House/apartment Stairs: 2 STE with rail  Has following equipment at home: Single point cane and Environmental consultant - 2 wheeled  OCCUPATION: retired- used to work for [employer]   PLOF: Independent, Independent with basic ADLs, Independent with gait, Independent with transfers, and Requires assistive device for independence  PATIENT GOALS: walk straight without a cane, get back to driving and motorcycling   NEXT MD VISIT: Referring 01/25/24  OBJECTIVE:  Note: Objective measures were completed at Evaluation unless otherwise noted.  DIAGNOSTIC FINDINGS:   CLINICAL DATA:  Stroke, follow up Neuro deficit, acute, stroke suspected   EXAM: MRI HEAD WITHOUT AND WITH CONTRAST   TECHNIQUE: Multiplanar, multiecho pulse sequences of the brain and surrounding structures were obtained  without and with intravenous contrast.   CONTRAST:  6.5mL GADAVIST  GADOBUTROL  1 MMOL/ML IV SOLN   COMPARISON:  CT head from today.   FINDINGS: Brain: When comparing across modalities, no substantial change in intraparenchymal hemorrhage in the left basal ganglia with intraventricular extension. No visible surrounding acute hemorrhage or mass lesion; however, acute blood products limits assessment. Prior left frontal infarct with encephalomalacia. No hydrocephalus. No pathologic enhancement.   Vascular: Major arterial flow voids are maintained at the skull base.   Skull and upper cervical spine: Normal marrow signal.   Sinuses/Orbits: Right frontal sinus opacification. Remaining sinuses are clear. No acute orbital findings.   Other: No mastoid effusions.   IMPRESSION: 1. When comparing across modalities, no substantial change in intraparenchymal hemorrhage in the left basal ganglia with intraventricular extension. No progressive mass effect. 2. No visible surrounding acute hemorrhage or mass lesion; however, acute blood products limits assessment  EXAM: CT HEAD WITHOUT CONTRAST   TECHNIQUE: Contiguous axial images were obtained from the base of the skull through the vertex without intravenous contrast.   RADIATION DOSE REDUCTION: This exam was performed according to the departmental dose-optimization program which includes automated exposure control, adjustment of the mA and/or kV according to patient size and/or use of iterative reconstruction technique.   COMPARISON:  Head CT 08/12/2023   FINDINGS: Brain: Interval decrease in size of the previously seen hemorrhage in the left basal ganglia, now measuring 10 x 7 mm, previously 20 x 12 mm. There is resolution of intraventricular blood products. No hydrocephalus. No extra-axial fluid collection. No mass effect. No mass lesion. No CT evidence of an acute cortical infarct. There is chronic left MCA territory infarct  involving the left opercular region.   Vascular: No hyperdense vessel or unexpected calcification.   Skull: Normal. Negative for fracture or focal lesion.   Sinuses/Orbits: No middle ear or mastoid effusion. Paranasal sinuses are notable for complete opacification of the right frontal sinus. Orbits are unremarkable.   Other: None.   IMPRESSION: No acute intracranial abnormality. Interval decrease in size of left basal ganglia hemorrhage, now measuring 10 x 7 mm, previously 20 x 12 mm. Resolution of intraventricular blood products. No hydrocephalus.    PATIENT SURVEYS:    Patient-Specific Activity Scoring Scheme  "0" represents "unable to perform." "10" represents "able to perform at prior level. 0 1 2 3 4 5 6 7 8 9  10 (Date and Score)   Activity Eval  12/03/23  12/24/23  1. Walking straight without the cane  4   6 6-7  2. Strength in RLE   4 6  5   3. Coordinated movements of R LE  3 6 3   4.     5.     Score 3.7 6 4.6   Total score = sum of the activity scores/number of activities Minimum detectable change (90%CI) for average score = 2 points Minimum detectable change (90%CI) for single activity score = 3 points     COGNITION: Overall cognitive status: Within functional limits for tasks assessed     SENSATION: Not tested      LOWER EXTREMITY MMT:  MMT Right eval Left eval 01/02/24 R   Hip flexion 4 5 4+  Hip extension     Hip abduction     Hip adduction     Hip internal rotation     Hip external rotation     Knee flexion 4 5 4+  Knee extension 4 5 4+  Ankle dorsiflexion 1 at best (in AFO, reports no movement in ankle)  DNT, in AFO   Ankle plantarflexion     Ankle inversion     Ankle eversion      (Blank rows = not tested)    FUNCTIONAL TESTS:  5 times sit to stand: 12.8 seconds some off shift from R LE  Timed up and go (TUG): deferred  3 minute walk test: 273ft [facility]     11/28/23 0001  Dynamic Gait Index  Level Surface 2  Change in Gait  Speed 2  Gait with Horizontal Head Turns 2  Gait with Vertical Head Turns 2  Gait and Pivot Turn 2  Step Over Obstacle 2  Step Around Obstacles 2  Steps 1  Total Score 15     GAIT: Distance walked: 28ft  Assistive device utilized: Single point cane Level of assistance: Modified independence Comments: wide BOS, increased tone in R LE with gait pattern- limited knee ROM and ankle tends to invert a bit , mild unsteadiness with corners, limited rotation at trunk/hips                                                                                                                                 TREATMENT DATE:  01/09/24:  Neuromuscular re education:  In ll bars , removed R AFO, tandem standing , L foot on 4" step, R foot back, narrowed stance, for 30 sec holds Same position with horizontal head turns Heel toe rocks on flat surface Heel/ toe rocks on airex Gait without R AFO x 100' therapist monitoring R ankle stability, Then gait 200' with AFO.  With AFO: Leg press 40# 15 x 1, then 60# 15 x 2 Standing modified tandem,lead foot on treadmill, with 3# therabar for horizontal trunk rotations 15 x each foot leading Standing in front of mat table, staggered stance, with 3# cuff wts each hand, large amplitude swings, 30 sec, fast, alt lead foot  01/02/24  MMT Nustep L6x8 minutes BLEs only   Alternating toe taps to soft targets on belt of TM- difficult to maintain WB R LE  in stance for movement with L LE Alternating cross midline toe taps to soft targets on belt of TM, light UE touch for balance/safety  Forwards/backwards walking with randomized direction changes min guard  Side stepping with randomized direction changes min guard  Alternating toe taps to triple targets at various levels, light BUE support    Education on 6 month window after CVA in terms of neuroplasticity and potential for best gains, benefits of continuing PT, importance of aerobic exercise and reciprocal motions  (like nustep or bike) for tissue perfusion and ongoing recovery      12/24/23: Reassessment:  Gait outdoors with st cane, down up curb x 2, on inclined asphalt, over 800'.   DGI 15/24 Forward heel taps L with R foot on 4" step, emphasis on eccentric training R quads Standing semi tandem, L foot on 2" step for horizontal and vertical head turns, 15 reps each to engage med/lat R ankle stability 15x Standing forward taps L foot onto 10' height to isolate weight bearing R LE 15 x     12/20/23  Nustep seat 7 L6x8 minutes all four extremities  STS goblet squat with eccentric lower 7# x12  STS with 4 inch box under L foot/7# in R UE cues for even wt and good eccentric  Forward heel taps for eccentric control/strength R LE 4 inch step x10   Hill and curb training outside with spc, S and intermittent cues for sequencing but improving           12/12/23  Side step over blue air pad (R) + forward step over mini air pads + side step over blue air pad (L) + side step over blue air pad (R) + forward step over mini air pads + side step over blue air pad (R) x3 rounds  Lateral toe taps alternating to small air targets + forward step up then step down on 4 inch box + alternating toe taps on bosu + step up then step down 4 inch box- difficulty maintaining good alignment with step down (1 round)   Nustep L6x8 minutes all four extremities  Gait outside over curbs and hills  with hurricane, general S and occasional cues for sequencing         12/11/23  Nustep L6x8 minutes all four extremities seat 7   One foot on BOSU/other on ground 3x30 seconds B PWR step laterally + reach to target at ground level on R x5 (standing) PWR step forward + mini squat to [facility] press to mini squat + backwards PWR step x5 Cross midline reach/wt shift to RLE + STS with multimodal cues to maintain wt on RLE with transitions x8  Gait and curb training outside- slopes and hills on even and uneven surfaces,  occasional cues for sequencing with curb navigation. Difficulty with eccentric quad work on hemi side  when going down hill     12/05/23  Nustep L6 x8 minutes all four extremities seat 7  Hip hikes x10 B min-mod cues for form  Forward step ups 6 inch step x12 B intermittent UE support  Gait training 258ft 1.5# R ankle for improved proprioception, cues for heel toe pattern especially push off with toe when about to enter swing phase  Backwards walking with 1.5# cues for improved step length L LE to promote increased stance time hemi LE    3 way taps off blue foam pad x10 B, difficulty due to weak hip ABD groups hemi LE  Side steps + forward/backward  steps over blue foam pad x2 laps            12/03/23  Nustep L6x8 minutes all four extremities for w/u, promotion of reciprocal movement  Forward step ups 6 inch step R LE intermittent UE support for mm endurance and WB tone management   One foot on BOSU/other on solid surface 3x30 seconds B- cues to not lock out R knee Forward lunges onto BOSU x10 B- cues to not lock out R knee in stance, and to avoid R LE circumduction when lifting to top of BOSU Lateral lunges onto BOSU x10 B- cues to not lock out R knee in stance, and to avoid RLE circumduction with movement to top of ball  Gait with 1.5# R LE for improved proprioception x128ft x2- first lap with hurricane, second without. Noted increased LE circumduction without AD    11/28/23   Side step, step over forward and backward on blue foam pad in // bars  DGI re-test  Tandem walk with lateral toe tap each step, alternating blue foam pad // bars     STS with yellow ball in goblet hold x10, cues to avoid offshift from hemi side- placed R foot back/L foot forward for majority of reps  Nustep L6x8 minutes all four extremities for reciprocal movement and functional activity tolerance  Heel toe steps on blue air pad for assist with rocking motion x10 B, cues to normalize heel-toe  and swing through, followed by gait training with focus on heel-toe pattern and keeping hips level            PATIENT EDUCATION:  Education details: exam findings, POC, HEP, otherwise as above  Person educated: Patient Education method: Explanation, Demonstration, and Handouts Education comprehension: verbalized understanding, returned demonstration, and needs further education  HOME EXERCISE PROGRAM:  Access Code: [email or web address] URL: [email or web address] Date: 11/20/2023 Prepared by: Terrel Ferries  Exercises - Heel Toe Raises with Counter Support  - 1 x daily - 7 x weekly - 2 sets - 10 reps - Stride Stance Weight Shift  - 1 x daily - 7 x weekly - 2 sets - 10 reps - Single Leg Balance with Clock Reach  - 1 x daily - 7 x weekly - 2 sets - 10 reps - Sit to Stand with Resistance Around Legs  - 1 x daily - 7 x weekly - 2 sets - 10 reps - Forward Step Over with Counter Support  - 1 x daily - 7 x weekly - 2 sets - 10 reps - Backward Step Over with Counter Support  - 1 x daily - 7 x weekly - 2 sets - 10 reps   ASSESSMENT:  CLINICAL IMPRESSION: Today the pt returned for ongoing PT.  Assessed/ observed his gait with and without his R AFO indoors, noted without AFO much less R ankle inversion and longer L stride length, but slower speed, and some delayed R ankle dorsiflexion .  With the AFO has R ankle inversion particularly with heel strike and mid stance. Addressed R ankle stability with balance challenges to address multiple planes of movement primarily with weight bearing on R.  He tolerated well. Continuing to evaluate need for R AFO or possible need to adapt due to his persistent R ankle inversion with weight bearing.              From eval: Patient is a 68 y.o. M who was seen today for physical therapy evaluation and treatment for I61.0 (ICD-10-CM) - Nontraumatic subcortical  hemorrhage of left cerebral hemisphere [facility]). He is very motivated and moving a very high level  given the hemorrhagic CVA. His main concerns are balance/coordination and getting back to walking without assistive device. Anticipate he will do very well with skilled PT services.   OBJECTIVE IMPAIRMENTS: Abnormal gait, decreased activity tolerance, decreased balance, decreased coordination, decreased knowledge of use of DME, decreased mobility, difficulty walking, decreased strength, and decreased safety awareness.   ACTIVITY LIMITATIONS: standing, squatting, stairs, transfers, and locomotion level  PARTICIPATION LIMITATIONS: driving, shopping, community activity, and yard work  PERSONAL FACTORS: Age, Behavior pattern, Fitness, Past/current experiences, Social background, Time since onset of injury/illness/exacerbation, and Transportation are also affecting patient's functional outcome.   REHAB POTENTIAL: Good  CLINICAL DECISION MAKING: Evolving/moderate complexity  EVALUATION COMPLEXITY: Moderate   GOALS: Goals reviewed with patient? No  SHORT TERM GOALS: Target date: 01/30/2024     Will be compliant with appropriate progressive HEP  Baseline: Goal status: MET 11/28/23  2.  Will score 14/24 on DGI  Baseline: 10 Goal status: MET 11/28/23 15/24  3.  Will be able to perform all functional transfers without compensation strategies or offshift from hemi LE  Baseline:  Goal status: ONGOING 01/02/24- progressing well but still present   4.  Will be able to ambulate household distances without AD, Mod(I) Baseline:  Goal status: ONGOING 01/02/24 still using cane 50% of the time     LONG TERM GOALS: Target date: 02/27/2024      MMT in R LE to be at least 4+/5 in hip and knee musculature  Baseline:  Goal status: 01/02/24 PARTIALLY MET with exception of ankle DF  2.  Will score 18/24 on DGI to show improved functional balance  Baseline:  Goal status: 12/24/23: 15/24  3.  Will be able to walk at least 248ft over uneven/unsteady surfaces without device and no more than S/ 566ft  over even surfaces no device Mod(I) Baseline:  Goal status: 12/24/23: to walk even terrain without device  , needs cane for curbs, uneven, over 700' mod I with cane  4.  Will be able to ascend and descend steps without rail and minimal compensation strategies  Baseline:  Goal status: 12/24/23: using rail  5.  Will be compliant with advanced HEP vs gym based program at DC to maintain functional gains  Baseline:  Goal status: 12/24/23: has a [employer] membership and has not utlized yet  6.  PSFS to improve by at least 3 points  Baseline: 3.7 Goal status: ONGOING 12/03/23 6 12/24/23: ongoing 4.7   PLAN:  PT FREQUENCY: 2x/week  PT DURATION: 8 weeks  PLANNED INTERVENTIONS: 97164- PT Re-evaluation, 97110-Therapeutic exercises, 97530- Therapeutic activity, 97112- Neuromuscular re-education, 97535- Self Care, 16109- Manual therapy, [id number]- Gait training, [phone]- Orthotic Fit/training, [phone]- Aquatic Therapy, Stair training, Taping, Dry Needling, Cryotherapy, and Moist heat  PLAN FOR NEXT SESSION: remove AFO for therapy session, continue to normalize gait pattern and functional movements, coordination and balance work   Shaaron Dar, PT, DPT, OCS 01/09/24 10:24 AM

## 2024-01-14 ENCOUNTER — Ambulatory Visit: Admitting: Physical Therapy

## 2024-01-14 ENCOUNTER — Encounter: Payer: Self-pay | Admitting: Physical Therapy

## 2024-01-14 DIAGNOSIS — M6281 Muscle weakness (generalized): Secondary | ICD-10-CM

## 2024-01-14 DIAGNOSIS — R2681 Unsteadiness on feet: Secondary | ICD-10-CM

## 2024-01-14 DIAGNOSIS — R2689 Other abnormalities of gait and mobility: Secondary | ICD-10-CM

## 2024-01-14 NOTE — Progress Notes (Signed)
   01/14/24 0001  Dynamic Gait Index  Level Surface 2  Change in Gait Speed 2  Gait with Horizontal Head Turns 2  Gait with Vertical Head Turns 2  Gait and Pivot Turn 1  Step Over Obstacle 1  Step Around Obstacles 2  Steps 1  Total Score 13  DGI comment: fatigued today, didn't sleep well

## 2024-01-14 NOTE — Therapy (Signed)
 OUTPATIENT PHYSICAL THERAPY TREATMENT    Patient Name: Adrian [name] MRN: [id number] DOB:[birth year], 68 y.o., male Today's Date: 01/14/2024      END OF SESSION:  PT End of Session - 01/14/24 1147     Visit Number 18    Number of Visits 32    Date for PT Re-Evaluation 02/27/24    Authorization Type MCR    Authorization Time Period 10/31/23 to 12/26/23; extended to 02/27/24    Progress Note Due on Visit 20   progress note was done visit 10   PT Start Time 1147    PT Stop Time 1228    PT Time Calculation (min) 41 min    Activity Tolerance Patient tolerated treatment well    Behavior During Therapy [facility] for tasks assessed/performed                              Past Medical History:  Diagnosis Date   AF (paroxysmal atrial fibrillation) (HCC) 11/29/2018   Atrial flutter (HCC) 10/12/2015   a. TEE 3/17 with ? LAA clot-->s/p TEE/DCCV 11/24/2015; s/p DCCV 12/2022;s/p ablation 06/2023   CAD (coronary artery disease), native coronary artery 12/06/2015   a. 10/2015 MV: EF  37%, reversible defect inferior apex, intermediate risk findings; b. 10/2015 Cath: 20% mid RCA.   Carotid artery stenosis    1-39% right ICA stenosis and occluded left ICA   Colonic diverticular abscess    Diverticulitis    History of chemotherapy 2005   Cisplatin   Hypothyroidism    NICM (nonischemic cardiomyopathy) (HCC) 10/14/2015   a. Tachy mediated?;  b. Echo 3/17 - Mild concentric LVH, EF 30-35%, anteroseptal, anterior, anterolateral, apical anterior, lateral hypokinesis, trivial MR, mild to moderately reduced RVSF; c. LHC 3/17 - mRCA 20%   Radiation NOv.3,2005-Dec. 15, 2005   6810 cGy in 30 fractions   Tonsil cancer [facility]) 2005   Dr Ava Lei.  XRT   Past Surgical History:  Procedure Laterality Date   A-FLUTTER ABLATION N/A 06/19/2023   Procedure: A-FLUTTER ABLATION;  Surgeon: Boyce Byes, MD;  [facility] LAB;  Service: Cardiovascular;  Laterality: N/A;   APPENDECTOMY  N/A 03/07/2019   Procedure: ROBOTIC ASSISTED APPENDECTOMY;  Surgeon: Candyce Champagne, MD;  Location: [facility] ORS;  Service: General;  Laterality: N/A;   CARDIAC CATHETERIZATION N/A 10/18/2015   Procedure: Left Heart Cath and Coronary Angiography;  Surgeon: Odie Benne, MD;  Location: [facility] INVASIVE CV LAB;  Service: Cardiovascular;  Laterality: N/A;   CARDIOVERSION N/A 11/24/2015   Procedure: CARDIOVERSION;  Surgeon: Loyde Rule, MD;  Location: [facility] ENDOSCOPY;  Service: Cardiovascular;  Laterality: N/A;   CARDIOVERSION N/A 12/20/2022   Procedure: CARDIOVERSION;  Surgeon: Bridgette Campus, MD;  Location: [facility] INVASIVE CV LAB;  Service: Cardiovascular;  Laterality: N/A;   CYSTOSCOPY WITH STENT PLACEMENT Bilateral 03/07/2019   Procedure: CYSTOSCOPY WITH BILATERAL FIREFLY INJECTION;  Surgeon: Andrez Banker, MD;  Location: [facility] ORS;  Service: Urology;  Laterality: Bilateral;   GASTROSTOMY TUBE PLACEMENT  07/04/2004   IR - G tube for tonsilar cancer   IR ANGIO INTRA EXTRACRAN SEL COM CAROTID INNOMINATE UNI R MOD SED  03/09/2019   IR ANGIO VERTEBRAL SEL VERTEBRAL UNI R MOD SED  03/09/2019   IR CT HEAD LTD  03/09/2019   IR PERCUTANEOUS ART THROMBECTOMY/INFUSION INTRACRANIAL INC DIAG ANGIO  03/09/2019   IR RADIOLOGIST EVAL & MGMT  12/18/2018   LAMINECTOMY  C5/placement of steel plate   LOOP RECORDER INSERTION N/A 06/19/2023   Procedure: LOOP RECORDER INSERTION;  Surgeon: Boyce Byes, MD;  Location: [facility] INVASIVE CV LAB;  Service: Cardiovascular;  Laterality: N/A;   NECK SURGERY  2003   replaced disk   RADIOLOGY WITH ANESTHESIA N/A 03/08/2019   Procedure: IR WITH ANESTHESIA;  Surgeon: Luellen Sages, MD;  Location: [facility] OR;  Service: Radiology;  Laterality: N/A;   TEE WITHOUT CARDIOVERSION N/A 10/15/2015   Procedure: TRANSESOPHAGEAL ECHOCARDIOGRAM (TEE);  Surgeon: Darlis Eisenmenger, MD;  Location: [facility] ENDOSCOPY;  Service: Cardiovascular;  Laterality: N/A;   TEE WITHOUT CARDIOVERSION N/A 11/24/2015    Procedure: TRANSESOPHAGEAL ECHOCARDIOGRAM (TEE);  Surgeon: Loyde Rule, MD;  Location: [facility] ENDOSCOPY;  Service: Cardiovascular;  Laterality: N/A;   TEE WITHOUT CARDIOVERSION N/A 12/20/2022   Procedure: TRANSESOPHAGEAL ECHOCARDIOGRAM;  Surgeon: Bridgette Campus, MD;  Location: [facility] INVASIVE CV LAB;  Service: Cardiovascular;  Laterality: N/A;   XI ROBOTIC ASSISTED COLOSTOMY TAKEDOWN N/A 03/07/2019   Procedure: XI ROBOTIC ASSISTED LOW ANTERIOR RESECTION, RIGID PROCTOSCOPY;  Surgeon: Candyce Champagne, MD;  Location: [facility] ORS;  Service: General;  Laterality: N/A;   Patient Active Problem List   Diagnosis Date Noted   Carotid occlusion, left 10/23/2023   ICH (intracerebral hemorrhage) (HCC) 08/11/2023   Carotid artery stenosis    Atrial flutter (HCC) 12/19/2022   Essential hypertension 04/22/2021   Hypokalemia 03/12/2019   Expressive aphasia 03/10/2019   Dysphagia 03/10/2019   Middle cerebral artery embolism, left 03/09/2019   Diverticular stricture (HCC) 03/07/2019   Bacteria in urine    Chronic atrial fibrillation (HCC) 12/02/2018   Current use of long term anticoagulation 12/02/2018   UTI (urinary tract infection) 11/29/2018   Hyponatremia 11/29/2018   PAF (paroxysmal atrial fibrillation) (HCC) 11/29/2018   Diverticulitis of large intestine with abscess 09/23/2018   Dyslipidemia 12/05/2017   CAD (coronary artery disease), native coronary artery 12/06/2015   SOB (shortness of breath)    NICM (nonischemic cardiomyopathy) (HCC)    Thyroid  activity decreased    Hypothyroidism 08/12/2013   History of radiation therapy 05/30/2012   Smokes tobacco daily 05/30/2012   Cancer of tonsillar fossa (HCC) 11/27/2011   Tonsil cancer (HCC) 2005   History of chemotherapy 2005    PCP: Glean Lamy MD   REFERRING PROVIDER: Genetta Kenning, MD  REFERRING DIAG: I61.0 (ICD-10-CM) - Nontraumatic subcortical hemorrhage of left cerebral hemisphere [facility])  THERAPY DIAG:  Muscle weakness  (generalized)  Other abnormalities of gait and mobility  Unsteadiness on feet  Rationale for Evaluation and Treatment: Rehabilitation  ONSET DATE: 08/11/23  SUBJECTIVE:   SUBJECTIVE STATEMENT:  I've been practicing walking around without the brace at home, it feels OK. Do feel more stable with it on.   PERTINENT HISTORY: 1.  History of left basal ganglia hemorrhage approximately 2 and half months ago.  He has completed inpatient rehabilitation as well as home health.  I do think he would be a great candidate for outpatient therapy.  He has gotten back to modified independent level but still has some mild weakness on the right side as well as fine motor issues and motor control issues.  His balance is also off.  He continues wear right AFO because of foot and ankle weakness.  Patient has some short-term memory issues as well Will make referral for outpatient PT OT and speech therapy Physical medicine rehab follow-up in 3 months  PAIN:  Are you having pain? No  0/10 now  PRECAUTIONS: Fall and Other: R hemi, has loop recorder   RED FLAGS: None   WEIGHT BEARING RESTRICTIONS: No  FALLS:  Has patient fallen in last 6 months? No  LIVING ENVIRONMENT: Lives with: lives alone Lives in: House/apartment Stairs: 2 STE with rail  Has following equipment at home: Single point cane and Environmental consultant - 2 wheeled  OCCUPATION: retired- used to work for [employer]   PLOF: Independent, Independent with basic ADLs, Independent with gait, Independent with transfers, and Requires assistive device for independence  PATIENT GOALS: walk straight without a cane, get back to driving and motorcycling   NEXT MD VISIT: Referring 01/25/24  OBJECTIVE:  Note: Objective measures were completed at Evaluation unless otherwise noted.  DIAGNOSTIC FINDINGS:   CLINICAL DATA:  Stroke, follow up Neuro deficit, acute, stroke suspected   EXAM: MRI HEAD WITHOUT AND WITH CONTRAST   TECHNIQUE: Multiplanar, multiecho  pulse sequences of the brain and surrounding structures were obtained without and with intravenous contrast.   CONTRAST:  6.5mL GADAVIST  GADOBUTROL  1 MMOL/ML IV SOLN   COMPARISON:  CT head from today.   FINDINGS: Brain: When comparing across modalities, no substantial change in intraparenchymal hemorrhage in the left basal ganglia with intraventricular extension. No visible surrounding acute hemorrhage or mass lesion; however, acute blood products limits assessment. Prior left frontal infarct with encephalomalacia. No hydrocephalus. No pathologic enhancement.   Vascular: Major arterial flow voids are maintained at the skull base.   Skull and upper cervical spine: Normal marrow signal.   Sinuses/Orbits: Right frontal sinus opacification. Remaining sinuses are clear. No acute orbital findings.   Other: No mastoid effusions.   IMPRESSION: 1. When comparing across modalities, no substantial change in intraparenchymal hemorrhage in the left basal ganglia with intraventricular extension. No progressive mass effect. 2. No visible surrounding acute hemorrhage or mass lesion; however, acute blood products limits assessment  EXAM: CT HEAD WITHOUT CONTRAST   TECHNIQUE: Contiguous axial images were obtained from the base of the skull through the vertex without intravenous contrast.   RADIATION DOSE REDUCTION: This exam was performed according to the departmental dose-optimization program which includes automated exposure control, adjustment of the mA and/or kV according to patient size and/or use of iterative reconstruction technique.   COMPARISON:  Head CT 08/12/2023   FINDINGS: Brain: Interval decrease in size of the previously seen hemorrhage in the left basal ganglia, now measuring 10 x 7 mm, previously 20 x 12 mm. There is resolution of intraventricular blood products. No hydrocephalus. No extra-axial fluid collection. No mass effect. No mass lesion. No CT evidence of an  acute cortical infarct. There is chronic left MCA territory infarct involving the left opercular region.   Vascular: No hyperdense vessel or unexpected calcification.   Skull: Normal. Negative for fracture or focal lesion.   Sinuses/Orbits: No middle ear or mastoid effusion. Paranasal sinuses are notable for complete opacification of the right frontal sinus. Orbits are unremarkable.   Other: None.   IMPRESSION: No acute intracranial abnormality. Interval decrease in size of left basal ganglia hemorrhage, now measuring 10 x 7 mm, previously 20 x 12 mm. Resolution of intraventricular blood products. No hydrocephalus.    PATIENT SURVEYS:    Patient-Specific Activity Scoring Scheme  "0" represents "unable to perform." "10" represents "able to perform at prior level. 0 1 2 3 4 5 6 7 8 9  10 (Date and Score)   Activity Eval  12/03/23  12/24/23  1. Walking straight without the cane  4   6 6-7  2. Strength in RLE   4 6  5   3. Coordinated movements of R LE  3 6 3   4.     5.     Score 3.7 6 4.6   Total score = sum of the activity scores/number of activities Minimum detectable change (90%CI) for average score = 2 points Minimum detectable change (90%CI) for single activity score = 3 points     COGNITION: Overall cognitive status: Within functional limits for tasks assessed     SENSATION: Not tested      LOWER EXTREMITY MMT:  MMT Right eval Left eval 01/02/24 R   Hip flexion 4 5 4+  Hip extension     Hip abduction     Hip adduction     Hip internal rotation     Hip external rotation     Knee flexion 4 5 4+  Knee extension 4 5 4+  Ankle dorsiflexion 1 at best (in AFO, reports no movement in ankle)  DNT, in AFO   Ankle plantarflexion     Ankle inversion     Ankle eversion      (Blank rows = not tested)    FUNCTIONAL TESTS:  5 times sit to stand: 12.8 seconds some off shift from R LE  Timed up and go (TUG): deferred  3 minute walk test: 232ft [facility]       01/14/24 0001  Dynamic Gait Index  Level Surface 2  Change in Gait Speed 2  Gait with Horizontal Head Turns 2  Gait with Vertical Head Turns 2  Gait and Pivot Turn 1  Step Over Obstacle 1  Step Around Obstacles 2  Steps 1  Total Score 13  DGI comment: fatigued today, didn't sleep well      GAIT: Distance walked: 230ft  Assistive device utilized: Single point cane Level of assistance: Modified independence Comments: wide BOS, increased tone in R LE with gait pattern- limited knee ROM and ankle tends to invert a bit , mild unsteadiness with corners, limited rotation at trunk/hips                                                                                                                                 TREATMENT DATE:   01/14/24   Gait without AFO- continued to note ongoing inversion tone, pt also reports toes curling at times. When he is distracted, does have reduced ankle DF but did not catch foot or trip. Education on AFO, could consider going back by [facility] to see if they feel if his brace needs modification (different build to help control inversion?), encouraged AFO wear out of house to avoid toe catching in busy environments  Nustep L6x8 minutes BLEs only, seat 7 DGI  Sidesteps blue foam pad x3 laps min guard  Cross midline reaches on blue foam pad randomized order min guard   Heel-toe rocks R LE on blue air pad  01/09/24:  Neuromuscular re education:  In ll bars , removed R AFO, tandem standing , L foot on 4" step, R foot back, narrowed stance, for 30 sec holds Same position with horizontal head turns Heel toe rocks on flat surface Heel/ toe rocks on airex Gait without R AFO x 100' therapist monitoring R ankle stability, Then gait 200' with AFO.  With AFO: Leg press 40# 15 x 1, then 60# 15 x 2 Standing modified tandem,lead foot on treadmill, with 3# therabar for horizontal trunk rotations 15 x each foot leading Standing in front of mat table, staggered  stance, with 3# cuff wts each hand, large amplitude swings, 30 sec, fast, alt lead foot  01/02/24  MMT Nustep L6x8 minutes BLEs only   Alternating toe taps to soft targets on belt of TM- difficult to maintain WB R LE  in stance for movement with L LE Alternating cross midline toe taps to soft targets on belt of TM, light UE touch for balance/safety  Forwards/backwards walking with randomized direction changes min guard  Side stepping with randomized direction changes min guard  Alternating toe taps to triple targets at various levels, light BUE support    Education on 6 month window after CVA in terms of neuroplasticity and potential for best gains, benefits of continuing PT, importance of aerobic exercise and reciprocal motions (like nustep or bike) for tissue perfusion and ongoing recovery         PATIENT EDUCATION:  Education details: exam findings, POC, HEP, otherwise as above  Person educated: Patient Education method: Programmer, multimedia, Demonstration, and Handouts Education comprehension: verbalized understanding, returned demonstration, and needs further education  HOME EXERCISE PROGRAM:  Access Code: [email or web address] URL: [email or web address] Date: 11/20/2023 Prepared by: Terrel Ferries  Exercises - Heel Toe Raises with Counter Support  - 1 x daily - 7 x weekly - 2 sets - 10 reps - Stride Stance Weight Shift  - 1 x daily - 7 x weekly - 2 sets - 10 reps - Single Leg Balance with Clock Reach  - 1 x daily - 7 x weekly - 2 sets - 10 reps - Sit to Stand with Resistance Around Legs  - 1 x daily - 7 x weekly - 2 sets - 10 reps - Forward Step Over with Counter Support  - 1 x daily - 7 x weekly - 2 sets - 10 reps - Backward Step Over with Counter Support  - 1 x daily - 7 x weekly - 2 sets - 10 reps   ASSESSMENT:  CLINICAL IMPRESSION:      Arrives today doing well, did OK without AFO today but does have inversion tone that he reports also tends to curl toes as well.  Recommended that he try to get seen at [facility] to assess his current brace/if he needs a bit more of an inversion block and recommended foam to help block toe curling/protect skin. Otherwise continued working on functional balance and gait pattern. Continue to recommend that he use brace in community as he does develop ongoing significant foot drop when fatigued or distracted.               From eval: Patient is a 68 y.o. M who was seen today for physical therapy evaluation and treatment for I61.0 (ICD-10-CM) - Nontraumatic subcortical hemorrhage of left cerebral hemisphere [facility]). He is very motivated and moving a very high level given the hemorrhagic CVA. His main concerns are balance/coordination and getting back to walking without assistive device.  Anticipate he will do very well with skilled PT services.   OBJECTIVE IMPAIRMENTS: Abnormal gait, decreased activity tolerance, decreased balance, decreased coordination, decreased knowledge of use of DME, decreased mobility, difficulty walking, decreased strength, and decreased safety awareness.   ACTIVITY LIMITATIONS: standing, squatting, stairs, transfers, and locomotion level  PARTICIPATION LIMITATIONS: driving, shopping, community activity, and yard work  PERSONAL FACTORS: Age, Behavior pattern, Fitness, Past/current experiences, Social background, Time since onset of injury/illness/exacerbation, and Transportation are also affecting patient's functional outcome.   REHAB POTENTIAL: Good  CLINICAL DECISION MAKING: Evolving/moderate complexity  EVALUATION COMPLEXITY: Moderate   GOALS: Goals reviewed with patient? No  SHORT TERM GOALS: Target date: 01/30/2024     Will be compliant with appropriate progressive HEP  Baseline: Goal status: MET 11/28/23  2.  Will score 14/24 on DGI  Baseline: 10 Goal status: MET 11/28/23 15/24  3.  Will be able to perform all functional transfers without compensation strategies or offshift from hemi LE   Baseline:  Goal status: ONGOING 01/02/24- progressing well but still present   4.  Will be able to ambulate household distances without AD, Mod(I) Baseline:  Goal status: ONGOING 01/02/24 still using cane 50% of the time     LONG TERM GOALS: Target date: 02/27/2024      MMT in R LE to be at least 4+/5 in hip and knee musculature  Baseline:  Goal status: 01/02/24 PARTIALLY MET with exception of ankle DF  2.  Will score 18/24 on DGI to show improved functional balance  Baseline:  Goal status: 12/24/23: 15/24  3.  Will be able to walk at least 230ft over uneven/unsteady surfaces without device and no more than S/ 542ft over even surfaces no device Mod(I) Baseline:  Goal status: 12/24/23: to walk even terrain without device  , needs cane for curbs, uneven, over 700' mod I with cane  4.  Will be able to ascend and descend steps without rail and minimal compensation strategies  Baseline:  Goal status: 12/24/23: using rail  5.  Will be compliant with advanced HEP vs gym based program at DC to maintain functional gains  Baseline:  Goal status: 12/24/23: has a [employer] membership and has not utlized yet  6.  PSFS to improve by at least 3 points  Baseline: 3.7 Goal status: ONGOING 12/03/23 6 12/24/23: ongoing 4.7   PLAN:  PT FREQUENCY: 2x/week  PT DURATION: 8 weeks  PLANNED INTERVENTIONS: [id number]- PT Re-evaluation, 97110-Therapeutic exercises, 97530- Therapeutic activity, 97112- Neuromuscular re-education, 97535- Self Care, [id number]- Manual therapy, [phone]- Gait training, [phone]- Orthotic Fit/training, [phone]- Aquatic Therapy, Stair training, Taping, Dry Needling, Cryotherapy, and Moist heat  PLAN FOR NEXT SESSION: remove AFO for therapy session/f/u on if he was able to contact [facility]?, continue to normalize gait pattern and functional movements, coordination and balance work   Terrel Ferries, PT, DPT 01/14/24 12:58 PM

## 2024-01-16 ENCOUNTER — Ambulatory Visit: Admitting: Physical Therapy

## 2024-01-16 ENCOUNTER — Encounter: Payer: Self-pay | Admitting: Physical Therapy

## 2024-01-16 DIAGNOSIS — R2689 Other abnormalities of gait and mobility: Secondary | ICD-10-CM

## 2024-01-16 DIAGNOSIS — R278 Other lack of coordination: Secondary | ICD-10-CM

## 2024-01-16 DIAGNOSIS — M6281 Muscle weakness (generalized): Secondary | ICD-10-CM

## 2024-01-16 DIAGNOSIS — R2681 Unsteadiness on feet: Secondary | ICD-10-CM

## 2024-01-16 DIAGNOSIS — R262 Difficulty in walking, not elsewhere classified: Secondary | ICD-10-CM

## 2024-01-16 NOTE — Therapy (Signed)
 OUTPATIENT PHYSICAL THERAPY TREATMENT    Patient Name: Adrian [name] MRN: [id number] DOB:1956/06/01, 68 y.o., male Today's Date: 01/16/2024      END OF SESSION:  PT End of Session - 01/16/24 1027     Visit Number 19    Number of Visits 32    Date for PT Re-Evaluation 02/27/24    Authorization Type MCR    Authorization Time Period 10/31/23 to 12/26/23; extended to 02/27/24    Progress Note Due on Visit 20    PT Start Time 1017    PT Stop Time 1056    PT Time Calculation (min) 39 min    Activity Tolerance Patient tolerated treatment well    Behavior During Therapy [facility] for tasks assessed/performed                               Past Medical History:  Diagnosis Date   AF (paroxysmal atrial fibrillation) (HCC) 11/29/2018   Atrial flutter (HCC) 10/12/2015   a. TEE 3/17 with ? LAA clot-->s/p TEE/DCCV 11/24/2015; s/p DCCV 12/2022;s/p ablation 06/2023   CAD (coronary artery disease), native coronary artery 12/06/2015   a. 10/2015 MV: EF  37%, reversible defect inferior apex, intermediate risk findings; b. 10/2015 Cath: 20% mid RCA.   Carotid artery stenosis    1-39% right ICA stenosis and occluded left ICA   Colonic diverticular abscess    Diverticulitis    History of chemotherapy 2005   Cisplatin   Hypothyroidism    NICM (nonischemic cardiomyopathy) (HCC) 10/14/2015   a. Tachy mediated?;  b. Echo 3/17 - Mild concentric LVH, EF 30-35%, anteroseptal, anterior, anterolateral, apical anterior, lateral hypokinesis, trivial MR, mild to moderately reduced RVSF; c. LHC 3/17 - mRCA 20%   Radiation NOv.3,2005-Dec. 15, 2005   6810 cGy in 30 fractions   Tonsil cancer [facility]) 2005   Dr Ava Lei.  XRT   Past Surgical History:  Procedure Laterality Date   A-FLUTTER ABLATION N/A 06/19/2023   Procedure: A-FLUTTER ABLATION;  Surgeon: Boyce Byes, MD;  Location: [facility] INVASIVE CV LAB;  Service: Cardiovascular;  Laterality: N/A;   APPENDECTOMY N/A 03/07/2019   Procedure:  ROBOTIC ASSISTED APPENDECTOMY;  Surgeon: Candyce Champagne, MD;  Location: [facility] ORS;  Service: General;  Laterality: N/A;   CARDIAC CATHETERIZATION N/A 10/18/2015   Procedure: Left Heart Cath and Coronary Angiography;  Surgeon: Odie Benne, MD;  Location: [facility] INVASIVE CV LAB;  Service: Cardiovascular;  Laterality: N/A;   CARDIOVERSION N/A 11/24/2015   Procedure: CARDIOVERSION;  Surgeon: Loyde Rule, MD;  Location: [facility] ENDOSCOPY;  Service: Cardiovascular;  Laterality: N/A;   CARDIOVERSION N/A 12/20/2022   Procedure: CARDIOVERSION;  Surgeon: Bridgette Campus, MD;  Location: [facility] INVASIVE CV LAB;  Service: Cardiovascular;  Laterality: N/A;   CYSTOSCOPY WITH STENT PLACEMENT Bilateral 03/07/2019   Procedure: CYSTOSCOPY WITH BILATERAL FIREFLY INJECTION;  Surgeon: Andrez Banker, MD;  Location: [facility] ORS;  Service: Urology;  Laterality: Bilateral;   GASTROSTOMY TUBE PLACEMENT  07/04/2004   IR - G tube for tonsilar cancer   IR ANGIO INTRA EXTRACRAN SEL COM CAROTID INNOMINATE UNI R MOD SED  03/09/2019   IR ANGIO VERTEBRAL SEL VERTEBRAL UNI R MOD SED  03/09/2019   IR CT HEAD LTD  03/09/2019   IR PERCUTANEOUS ART THROMBECTOMY/INFUSION INTRACRANIAL INC DIAG ANGIO  03/09/2019   IR RADIOLOGIST EVAL & MGMT  12/18/2018   LAMINECTOMY     C5/placement of steel plate  LOOP RECORDER INSERTION N/A 06/19/2023   Procedure: LOOP RECORDER INSERTION;  Surgeon: Boyce Byes, MD;  Location: [facility] INVASIVE CV LAB;  Service: Cardiovascular;  Laterality: N/A;   NECK SURGERY  2003   replaced disk   RADIOLOGY WITH ANESTHESIA N/A 03/08/2019   Procedure: IR WITH ANESTHESIA;  Surgeon: Luellen Sages, MD;  Location: [facility] OR;  Service: Radiology;  Laterality: N/A;   TEE WITHOUT CARDIOVERSION N/A 10/15/2015   Procedure: TRANSESOPHAGEAL ECHOCARDIOGRAM (TEE);  Surgeon: Darlis Eisenmenger, MD;  Location: [facility] ENDOSCOPY;  Service: Cardiovascular;  Laterality: N/A;   TEE WITHOUT CARDIOVERSION N/A 11/24/2015   Procedure: TRANSESOPHAGEAL  ECHOCARDIOGRAM (TEE);  Surgeon: Loyde Rule, MD;  Location: [facility] ENDOSCOPY;  Service: Cardiovascular;  Laterality: N/A;   TEE WITHOUT CARDIOVERSION N/A 12/20/2022   Procedure: TRANSESOPHAGEAL ECHOCARDIOGRAM;  Surgeon: Bridgette Campus, MD;  Location: [facility] INVASIVE CV LAB;  Service: Cardiovascular;  Laterality: N/A;   XI ROBOTIC ASSISTED COLOSTOMY TAKEDOWN N/A 03/07/2019   Procedure: XI ROBOTIC ASSISTED LOW ANTERIOR RESECTION, RIGID PROCTOSCOPY;  Surgeon: Candyce Champagne, MD;  Location: [facility] ORS;  Service: General;  Laterality: N/A;   Patient Active Problem List   Diagnosis Date Noted   Carotid occlusion, left 10/23/2023   ICH (intracerebral hemorrhage) (HCC) 08/11/2023   Carotid artery stenosis    Atrial flutter (HCC) 12/19/2022   Essential hypertension 04/22/2021   Hypokalemia 03/12/2019   Expressive aphasia 03/10/2019   Dysphagia 03/10/2019   Middle cerebral artery embolism, left 03/09/2019   Diverticular stricture (HCC) 03/07/2019   Bacteria in urine    Chronic atrial fibrillation (HCC) 12/02/2018   Current use of long term anticoagulation 12/02/2018   UTI (urinary tract infection) 11/29/2018   Hyponatremia 11/29/2018   PAF (paroxysmal atrial fibrillation) (HCC) 11/29/2018   Diverticulitis of large intestine with abscess 09/23/2018   Dyslipidemia 12/05/2017   CAD (coronary artery disease), native coronary artery 12/06/2015   SOB (shortness of breath)    NICM (nonischemic cardiomyopathy) (HCC)    Thyroid  activity decreased    Hypothyroidism 08/12/2013   History of radiation therapy 05/30/2012   Smokes tobacco daily 05/30/2012   Cancer of tonsillar fossa (HCC) 11/27/2011   Tonsil cancer (HCC) 2005   History of chemotherapy 2005    PCP: Glean Lamy MD   REFERRING PROVIDER: Genetta Kenning, MD  REFERRING DIAG: I61.0 (ICD-10-CM) - Nontraumatic subcortical hemorrhage of left cerebral hemisphere [facility])  THERAPY DIAG:  Muscle weakness (generalized)  Other abnormalities of  gait and mobility  Unsteadiness on feet  Other lack of coordination  Difficulty in walking, not elsewhere classified  Rationale for Evaluation and Treatment: Rehabilitation  ONSET DATE: 08/11/23  SUBJECTIVE:   SUBJECTIVE STATEMENT:  Nothing new      PERTINENT HISTORY: 1.  History of left basal ganglia hemorrhage approximately 2 and half months ago.  He has completed inpatient rehabilitation as well as home health.  I do think he would be a great candidate for outpatient therapy.  He has gotten back to modified independent level but still has some mild weakness on the right side as well as fine motor issues and motor control issues.  His balance is also off.  He continues wear right AFO because of foot and ankle weakness.  Patient has some short-term memory issues as well Will make referral for outpatient PT OT and speech therapy Physical medicine rehab follow-up in 3 months  PAIN:  Are you having pain? No  0/10 today   PRECAUTIONS: Fall and Other: R hemi, has loop recorder  RED FLAGS: None   WEIGHT BEARING RESTRICTIONS: No  FALLS:  Has patient fallen in last 6 months? No  LIVING ENVIRONMENT: Lives with: lives alone Lives in: House/apartment Stairs: 2 STE with rail  Has following equipment at home: Single point cane and Environmental consultant - 2 wheeled  OCCUPATION: retired- used to work for [employer]   PLOF: Independent, Independent with basic ADLs, Independent with gait, Independent with transfers, and Requires assistive device for independence  PATIENT GOALS: walk straight without a cane, get back to driving and motorcycling   NEXT MD VISIT: Referring 01/25/24  OBJECTIVE:  Note: Objective measures were completed at Evaluation unless otherwise noted.  DIAGNOSTIC FINDINGS:   CLINICAL DATA:  Stroke, follow up Neuro deficit, acute, stroke suspected   EXAM: MRI HEAD WITHOUT AND WITH CONTRAST   TECHNIQUE: Multiplanar, multiecho pulse sequences of the brain and  surrounding structures were obtained without and with intravenous contrast.   CONTRAST:  6.5mL GADAVIST  GADOBUTROL  1 MMOL/ML IV SOLN   COMPARISON:  CT head from today.   FINDINGS: Brain: When comparing across modalities, no substantial change in intraparenchymal hemorrhage in the left basal ganglia with intraventricular extension. No visible surrounding acute hemorrhage or mass lesion; however, acute blood products limits assessment. Prior left frontal infarct with encephalomalacia. No hydrocephalus. No pathologic enhancement.   Vascular: Major arterial flow voids are maintained at the skull base.   Skull and upper cervical spine: Normal marrow signal.   Sinuses/Orbits: Right frontal sinus opacification. Remaining sinuses are clear. No acute orbital findings.   Other: No mastoid effusions.   IMPRESSION: 1. When comparing across modalities, no substantial change in intraparenchymal hemorrhage in the left basal ganglia with intraventricular extension. No progressive mass effect. 2. No visible surrounding acute hemorrhage or mass lesion; however, acute blood products limits assessment  EXAM: CT HEAD WITHOUT CONTRAST   TECHNIQUE: Contiguous axial images were obtained from the base of the skull through the vertex without intravenous contrast.   RADIATION DOSE REDUCTION: This exam was performed according to the departmental dose-optimization program which includes automated exposure control, adjustment of the mA and/or kV according to patient size and/or use of iterative reconstruction technique.   COMPARISON:  Head CT 08/12/2023   FINDINGS: Brain: Interval decrease in size of the previously seen hemorrhage in the left basal ganglia, now measuring 10 x 7 mm, previously 20 x 12 mm. There is resolution of intraventricular blood products. No hydrocephalus. No extra-axial fluid collection. No mass effect. No mass lesion. No CT evidence of an acute cortical infarct. There  is chronic left MCA territory infarct involving the left opercular region.   Vascular: No hyperdense vessel or unexpected calcification.   Skull: Normal. Negative for fracture or focal lesion.   Sinuses/Orbits: No middle ear or mastoid effusion. Paranasal sinuses are notable for complete opacification of the right frontal sinus. Orbits are unremarkable.   Other: None.   IMPRESSION: No acute intracranial abnormality. Interval decrease in size of left basal ganglia hemorrhage, now measuring 10 x 7 mm, previously 20 x 12 mm. Resolution of intraventricular blood products. No hydrocephalus.    PATIENT SURVEYS:    Patient-Specific Activity Scoring Scheme  0 represents "unable to perform." 10 represents "able to perform at prior level. 0 1 2 3 4 5 6 7 8 9  10 (Date and Score)   Activity Eval  12/03/23  12/24/23  1. Walking straight without the cane  4   6 6-7  2. Strength in RLE   4 6  5  3. Coordinated movements of R LE  3 6 3   4.     5.     Score 3.7 6 4.6   Total score = sum of the activity scores/number of activities Minimum detectable change (90%CI) for average score = 2 points Minimum detectable change (90%CI) for single activity score = 3 points     COGNITION: Overall cognitive status: Within functional limits for tasks assessed     SENSATION: Not tested      LOWER EXTREMITY MMT:  MMT Right eval Left eval 01/02/24 R   Hip flexion 4 5 4+  Hip extension     Hip abduction     Hip adduction     Hip internal rotation     Hip external rotation     Knee flexion 4 5 4+  Knee extension 4 5 4+  Ankle dorsiflexion 1 at best (in AFO, reports no movement in ankle)  DNT, in AFO   Ankle plantarflexion     Ankle inversion     Ankle eversion      (Blank rows = not tested)    FUNCTIONAL TESTS:  5 times sit to stand: 12.8 seconds some off shift from R LE  Timed up and go (TUG): deferred  3 minute walk test: 276ft [facility]      01/14/24 0001  Dynamic Gait  Index  Level Surface 2  Change in Gait Speed 2  Gait with Horizontal Head Turns 2  Gait with Vertical Head Turns 2  Gait and Pivot Turn 1  Step Over Obstacle 1  Step Around Obstacles 2  Steps 1  Total Score 13  DGI comment: fatigued today, didn't sleep well      GAIT: Distance walked: 251ft  Assistive device utilized: Single point cane Level of assistance: Modified independence Comments: wide BOS, increased tone in R LE with gait pattern- limited knee ROM and ankle tends to invert a bit , mild unsteadiness with corners, limited rotation at trunk/hips                                                                                                                                 TREATMENT DATE:   01/16/24  AFO off in // bars/NMR for hemi-ankle:  - mini-lunge onto blue air pad x12  - assisted PF/DF on rockerboard with assist from L LE and BUE support (tried to have R ankle do approximately 75% of effort) x10 had issues with knee wanting to buckle without AFO on  - assisted inversion/eversion on rockerboard with assist from L LE and BUE support (R ankle doing about 75% of effort), less knee flexion moment  Nustep L6x8 minutes BLEs only  Gait outside focus on hill navigation (L side of parking lot) cane and general S/occasional Min guard, did need MinA/Min cues for safe curb navigation        01/14/24   Gait without AFO- continued to note ongoing inversion tone,  pt also reports toes curling at times. When he is distracted, does have reduced ankle DF but did not catch foot or trip. Education on AFO, could consider going back by [employer] to see if they feel if his brace needs modification (different build to help control inversion?), encouraged AFO wear out of house to avoid toe catching in busy environments  Nustep L6x8 minutes BLEs only, seat 7 DGI  Sidesteps blue foam pad x3 laps min guard  Cross midline reaches on blue foam pad randomized order min guard   Heel-toe rocks  R LE on blue air pad            PATIENT EDUCATION:  Education details: exam findings, POC, HEP, otherwise as above  Person educated: Patient Education method: Programmer, multimedia, Facilities manager, and Handouts Education comprehension: verbalized understanding, returned demonstration, and needs further education  HOME EXERCISE PROGRAM:  Access Code: [email or web address] URL: [email or web address] Date: 11/20/2023 Prepared by: Terrel Ferries  Exercises - Heel Toe Raises with Counter Support  - 1 x daily - 7 x weekly - 2 sets - 10 reps - Stride Stance Weight Shift  - 1 x daily - 7 x weekly - 2 sets - 10 reps - Single Leg Balance with Clock Reach  - 1 x daily - 7 x weekly - 2 sets - 10 reps - Sit to Stand with Resistance Around Legs  - 1 x daily - 7 x weekly - 2 sets - 10 reps - Forward Step Over with Counter Support  - 1 x daily - 7 x weekly - 2 sets - 10 reps - Backward Step Over with Counter Support  - 1 x daily - 7 x weekly - 2 sets - 10 reps   ASSESSMENT:  CLINICAL IMPRESSION:    Continued working on ankle endurance with AFO off in parallel bars, I still think he needs AFO when ambulating in community as ankle fatigues quickly resulting in rapidly worsening foot drop and ankle instability/high fall risk. Gave him [employer]'s contact info, encouraged him to make appt to see if we need to update brace given inversion tone. Also kept working on outdoor navigation/safety today. He had multiple episodes of knee buckling with challenges outside of AFO today, I am concerned that getting out of AFO too soon will lead to a potential fall and educated on this.              From eval: Patient is a 68 y.o. M who was seen today for physical therapy evaluation and treatment for I61.0 (ICD-10-CM) - Nontraumatic subcortical hemorrhage of left cerebral hemisphere [facility]). He is very motivated and moving a very high level given the hemorrhagic CVA. His main concerns are balance/coordination and getting  back to walking without assistive device. Anticipate he will do very well with skilled PT services.   OBJECTIVE IMPAIRMENTS: Abnormal gait, decreased activity tolerance, decreased balance, decreased coordination, decreased knowledge of use of DME, decreased mobility, difficulty walking, decreased strength, and decreased safety awareness.   ACTIVITY LIMITATIONS: standing, squatting, stairs, transfers, and locomotion level  PARTICIPATION LIMITATIONS: driving, shopping, community activity, and yard work  PERSONAL FACTORS: Age, Behavior pattern, Fitness, Past/current experiences, Social background, Time since onset of injury/illness/exacerbation, and Transportation are also affecting patient's functional outcome.   REHAB POTENTIAL: Good  CLINICAL DECISION MAKING: Evolving/moderate complexity  EVALUATION COMPLEXITY: Moderate   GOALS: Goals reviewed with patient? No  SHORT TERM GOALS: Target date: 01/30/2024     Will be compliant with appropriate progressive HEP  Baseline: Goal status:  MET 11/28/23  2.  Will score 14/24 on DGI  Baseline: 10 Goal status: MET 11/28/23 15/24  3.  Will be able to perform all functional transfers without compensation strategies or offshift from hemi LE  Baseline:  Goal status: ONGOING 01/02/24- progressing well but still present   4.  Will be able to ambulate household distances without AD, Mod(I) Baseline:  Goal status: ONGOING 01/02/24 still using cane 50% of the time     LONG TERM GOALS: Target date: 02/27/2024      MMT in R LE to be at least 4+/5 in hip and knee musculature  Baseline:  Goal status: 01/02/24 PARTIALLY MET with exception of ankle DF  2.  Will score 18/24 on DGI to show improved functional balance  Baseline:  Goal status: ongoing 01/16/24   3.  Will be able to walk at least 29ft over uneven/unsteady surfaces without device and no more than S/ 568ft over even surfaces no device Mod(I) Baseline:  Goal status: 12/24/23: to walk  even terrain without device  , needs cane for curbs, uneven, over 700' mod I with cane  4.  Will be able to ascend and descend steps without rail and minimal compensation strategies  Baseline:  Goal status: ONGOING 01/16/24 needs rails for safety   5.  Will be compliant with advanced HEP vs gym based program at DC to maintain functional gains  Baseline:  Goal status: 12/24/23: has a [employer] membership and has not utlized yet  6.  PSFS to improve by at least 3 points  Baseline: 3.7 Goal status: ONGOING 12/03/23 6 12/24/23: ongoing 4.7   PLAN:  PT FREQUENCY: 2x/week  PT DURATION: 8 weeks  PLANNED INTERVENTIONS: [id number]- PT Re-evaluation, 97110-Therapeutic exercises, 97530- Therapeutic activity, 97112- Neuromuscular re-education, 97535- Self Care, 56213- Manual therapy, [id number]- Gait training, [phone]- Orthotic Fit/training, [phone]- Aquatic Therapy, Stair training, Taping, Dry Needling, Cryotherapy, and Moist heat  PLAN FOR NEXT SESSION: remove AFO for therapy session, continue to normalize gait pattern and functional movements, coordination and balance work. 20TH VISIT PROGRESS NOTE next visit   Terrel Ferries, PT, DPT 01/16/24 10:58 AM

## 2024-01-17 ENCOUNTER — Ambulatory Visit (INDEPENDENT_AMBULATORY_CARE_PROVIDER_SITE_OTHER): Payer: BC Managed Care – PPO

## 2024-01-17 DIAGNOSIS — I63419 Cerebral infarction due to embolism of unspecified middle cerebral artery: Secondary | ICD-10-CM | POA: Diagnosis not present

## 2024-01-18 NOTE — Progress Notes (Signed)
 Carelink Summary Report / Loop Recorder

## 2024-01-18 NOTE — Addendum Note (Signed)
 Addended by: Lott Rouleau A on: 01/18/2024 12:14 PM   Modules accepted: Orders

## 2024-01-21 ENCOUNTER — Ambulatory Visit (INDEPENDENT_AMBULATORY_CARE_PROVIDER_SITE_OTHER): Payer: Self-pay

## 2024-01-21 ENCOUNTER — Ambulatory Visit: Admitting: Physical Therapy

## 2024-01-21 ENCOUNTER — Encounter: Payer: Self-pay | Admitting: Physical Therapy

## 2024-01-21 DIAGNOSIS — M6281 Muscle weakness (generalized): Secondary | ICD-10-CM | POA: Diagnosis not present

## 2024-01-21 DIAGNOSIS — R2681 Unsteadiness on feet: Secondary | ICD-10-CM

## 2024-01-21 DIAGNOSIS — I4892 Unspecified atrial flutter: Secondary | ICD-10-CM

## 2024-01-21 DIAGNOSIS — R2689 Other abnormalities of gait and mobility: Secondary | ICD-10-CM

## 2024-01-21 LAB — CUP PACEART REMOTE DEVICE CHECK
Date Time Interrogation Session: 20250615135100
Implantable Pulse Generator Implant Date: 20241112
Pulse Gen Serial Number: 126890

## 2024-01-21 NOTE — Therapy (Signed)
 OUTPATIENT PHYSICAL THERAPY PROGRESS NOTE    Patient Name: Adrian [name] MRN: [id number] DOB:02/16/1956, 68 y.o., male Today's Date: 01/21/2024  Progress Note Reporting Period 12/05/23 to 01/21/24  See note below for Objective Data and Assessment of Progress/Goals.          END OF SESSION:  PT End of Session - 01/21/24 0952     Visit Number 20    Number of Visits 23    Date for PT Re-Evaluation 01/30/24    Authorization Type MCR    Authorization Time Period 10/31/23 to 12/26/23; extended to 02/27/24    Progress Note Due on Visit 30    PT Start Time 0933    PT Stop Time 1013    PT Time Calculation (min) 40 min    Activity Tolerance Patient tolerated treatment well    Behavior During Therapy [facility] for tasks assessed/performed                             Past Medical History:  Diagnosis Date   AF (paroxysmal atrial fibrillation) (HCC) 11/29/2018   Atrial flutter (HCC) 10/12/2015   a. TEE 3/17 with ? LAA clot-->s/p TEE/DCCV 11/24/2015; s/p DCCV 12/2022;s/p ablation 06/2023   CAD (coronary artery disease), native coronary artery 12/06/2015   a. 10/2015 MV: EF  37%, reversible defect inferior apex, intermediate risk findings; b. 10/2015 Cath: 20% mid RCA.   Carotid artery stenosis    1-39% right ICA stenosis and occluded left ICA   Colonic diverticular abscess    Diverticulitis    History of chemotherapy 2005   Cisplatin   Hypothyroidism    NICM (nonischemic cardiomyopathy) (HCC) 10/14/2015   a. Tachy mediated?;  b. Echo 3/17 - Mild concentric LVH, EF 30-35%, anteroseptal, anterior, anterolateral, apical anterior, lateral hypokinesis, trivial MR, mild to moderately reduced RVSF; c. LHC 3/17 - mRCA 20%   Radiation NOv.3,2005-Dec. 15, 2005   6810 cGy in 30 fractions   Tonsil cancer [facility]) 2005   Dr Ava Lei.  XRT   Past Surgical History:  Procedure Laterality Date   A-FLUTTER ABLATION N/A 06/19/2023   Procedure: A-FLUTTER ABLATION;  Surgeon: Boyce Byes, MD;  Location: [facility] INVASIVE CV LAB;  Service: Cardiovascular;  Laterality: N/A;   APPENDECTOMY N/A 03/07/2019   Procedure: ROBOTIC ASSISTED APPENDECTOMY;  Surgeon: Candyce Champagne, MD;  Location: [facility] ORS;  Service: General;  Laterality: N/A;   CARDIAC CATHETERIZATION N/A 10/18/2015   Procedure: Left Heart Cath and Coronary Angiography;  Surgeon: Odie Benne, MD;  Location: [facility] INVASIVE CV LAB;  Service: Cardiovascular;  Laterality: N/A;   CARDIOVERSION N/A 11/24/2015   Procedure: CARDIOVERSION;  Surgeon: Loyde Rule, MD;  Location: [facility] ENDOSCOPY;  Service: Cardiovascular;  Laterality: N/A;   CARDIOVERSION N/A 12/20/2022   Procedure: CARDIOVERSION;  Surgeon: Bridgette Campus, MD;  Location: [facility] INVASIVE CV LAB;  Service: Cardiovascular;  Laterality: N/A;   CYSTOSCOPY WITH STENT PLACEMENT Bilateral 03/07/2019   Procedure: CYSTOSCOPY WITH BILATERAL FIREFLY INJECTION;  Surgeon: Andrez Banker, MD;  Location: [facility] ORS;  Service: Urology;  Laterality: Bilateral;   GASTROSTOMY TUBE PLACEMENT  07/04/2004   IR - G tube for tonsilar cancer   IR ANGIO INTRA EXTRACRAN SEL COM CAROTID INNOMINATE UNI R MOD SED  03/09/2019   IR ANGIO VERTEBRAL SEL VERTEBRAL UNI R MOD SED  03/09/2019   IR CT HEAD LTD  03/09/2019   IR PERCUTANEOUS ART THROMBECTOMY/INFUSION INTRACRANIAL INC DIAG ANGIO  03/09/2019  IR RADIOLOGIST EVAL & MGMT  12/18/2018   LAMINECTOMY     C5/placement of steel plate   LOOP RECORDER INSERTION N/A 06/19/2023   Procedure: LOOP RECORDER INSERTION;  Surgeon: Boyce Byes, MD;  Location: [facility] INVASIVE CV LAB;  Service: Cardiovascular;  Laterality: N/A;   NECK SURGERY  2003   replaced disk   RADIOLOGY WITH ANESTHESIA N/A 03/08/2019   Procedure: IR WITH ANESTHESIA;  Surgeon: Luellen Sages, MD;  Location: [facility] OR;  Service: Radiology;  Laterality: N/A;   TEE WITHOUT CARDIOVERSION N/A 10/15/2015   Procedure: TRANSESOPHAGEAL ECHOCARDIOGRAM (TEE);  Surgeon: Darlis Eisenmenger, MD;  Location: [facility]  ENDOSCOPY;  Service: Cardiovascular;  Laterality: N/A;   TEE WITHOUT CARDIOVERSION N/A 11/24/2015   Procedure: TRANSESOPHAGEAL ECHOCARDIOGRAM (TEE);  Surgeon: Loyde Rule, MD;  Location: [facility] ENDOSCOPY;  Service: Cardiovascular;  Laterality: N/A;   TEE WITHOUT CARDIOVERSION N/A 12/20/2022   Procedure: TRANSESOPHAGEAL ECHOCARDIOGRAM;  Surgeon: Bridgette Campus, MD;  Location: [facility] INVASIVE CV LAB;  Service: Cardiovascular;  Laterality: N/A;   XI ROBOTIC ASSISTED COLOSTOMY TAKEDOWN N/A 03/07/2019   Procedure: XI ROBOTIC ASSISTED LOW ANTERIOR RESECTION, RIGID PROCTOSCOPY;  Surgeon: Candyce Champagne, MD;  Location: [facility] ORS;  Service: General;  Laterality: N/A;   Patient Active Problem List   Diagnosis Date Noted   Carotid occlusion, left 10/23/2023   ICH (intracerebral hemorrhage) (HCC) 08/11/2023   Carotid artery stenosis    Atrial flutter (HCC) 12/19/2022   Essential hypertension 04/22/2021   Hypokalemia 03/12/2019   Expressive aphasia 03/10/2019   Dysphagia 03/10/2019   Middle cerebral artery embolism, left 03/09/2019   Diverticular stricture (HCC) 03/07/2019   Bacteria in urine    Chronic atrial fibrillation (HCC) 12/02/2018   Current use of long term anticoagulation 12/02/2018   UTI (urinary tract infection) 11/29/2018   Hyponatremia 11/29/2018   PAF (paroxysmal atrial fibrillation) (HCC) 11/29/2018   Diverticulitis of large intestine with abscess 09/23/2018   Dyslipidemia 12/05/2017   CAD (coronary artery disease), native coronary artery 12/06/2015   SOB (shortness of breath)    NICM (nonischemic cardiomyopathy) (HCC)    Thyroid  activity decreased    Hypothyroidism 08/12/2013   History of radiation therapy 05/30/2012   Smokes tobacco daily 05/30/2012   Cancer of tonsillar fossa (HCC) 11/27/2011   Tonsil cancer (HCC) 2005   History of chemotherapy 2005    PCP: Glean Lamy MD   REFERRING PROVIDER: Genetta Kenning, MD  REFERRING DIAG: I61.0 (ICD-10-CM) - Nontraumatic  subcortical hemorrhage of left cerebral hemisphere [facility])  THERAPY DIAG:  Muscle weakness (generalized)  Other abnormalities of gait and mobility  Unsteadiness on feet  Rationale for Evaluation and Treatment: Rehabilitation  ONSET DATE: 08/11/23  SUBJECTIVE:   SUBJECTIVE STATEMENT:  Nothing new today, didn't try anything new over the weekend      PERTINENT HISTORY: 1.  History of left basal ganglia hemorrhage approximately 2 and half months ago.  He has completed inpatient rehabilitation as well as home health.  I do think he would be a great candidate for outpatient therapy.  He has gotten back to modified independent level but still has some mild weakness on the right side as well as fine motor issues and motor control issues.  His balance is also off.  He continues wear right AFO because of foot and ankle weakness.  Patient has some short-term memory issues as well Will make referral for outpatient PT OT and speech therapy Physical medicine rehab follow-up in 3 months  PAIN:  Are you having  pain? No  0/10 now   PRECAUTIONS: Fall and Other: R hemi, has loop recorder   RED FLAGS: None   WEIGHT BEARING RESTRICTIONS: No  FALLS:  Has patient fallen in last 6 months? No  LIVING ENVIRONMENT: Lives with: lives alone Lives in: House/apartment Stairs: 2 STE with rail  Has following equipment at home: Single point cane and Environmental consultant - 2 wheeled  OCCUPATION: retired- used to work for [employer]   PLOF: Independent, Independent with basic ADLs, Independent with gait, Independent with transfers, and Requires assistive device for independence  PATIENT GOALS: walk straight without a cane, get back to driving and motorcycling   NEXT MD VISIT: Referring 01/25/24  OBJECTIVE:  Note: Objective measures were completed at Evaluation unless otherwise noted.  DIAGNOSTIC FINDINGS:   CLINICAL DATA:  Stroke, follow up Neuro deficit, acute, stroke suspected   EXAM: MRI HEAD WITHOUT AND WITH  CONTRAST   TECHNIQUE: Multiplanar, multiecho pulse sequences of the brain and surrounding structures were obtained without and with intravenous contrast.   CONTRAST:  6.5mL GADAVIST  GADOBUTROL  1 MMOL/ML IV SOLN   COMPARISON:  CT head from today.   FINDINGS: Brain: When comparing across modalities, no substantial change in intraparenchymal hemorrhage in the left basal ganglia with intraventricular extension. No visible surrounding acute hemorrhage or mass lesion; however, acute blood products limits assessment. Prior left frontal infarct with encephalomalacia. No hydrocephalus. No pathologic enhancement.   Vascular: Major arterial flow voids are maintained at the skull base.   Skull and upper cervical spine: Normal marrow signal.   Sinuses/Orbits: Right frontal sinus opacification. Remaining sinuses are clear. No acute orbital findings.   Other: No mastoid effusions.   IMPRESSION: 1. When comparing across modalities, no substantial change in intraparenchymal hemorrhage in the left basal ganglia with intraventricular extension. No progressive mass effect. 2. No visible surrounding acute hemorrhage or mass lesion; however, acute blood products limits assessment  EXAM: CT HEAD WITHOUT CONTRAST   TECHNIQUE: Contiguous axial images were obtained from the base of the skull through the vertex without intravenous contrast.   RADIATION DOSE REDUCTION: This exam was performed according to the departmental dose-optimization program which includes automated exposure control, adjustment of the mA and/or kV according to patient size and/or use of iterative reconstruction technique.   COMPARISON:  Head CT 08/12/2023   FINDINGS: Brain: Interval decrease in size of the previously seen hemorrhage in the left basal ganglia, now measuring 10 x 7 mm, previously 20 x 12 mm. There is resolution of intraventricular blood products. No hydrocephalus. No extra-axial fluid collection. No mass  effect. No mass lesion. No CT evidence of an acute cortical infarct. There is chronic left MCA territory infarct involving the left opercular region.   Vascular: No hyperdense vessel or unexpected calcification.   Skull: Normal. Negative for fracture or focal lesion.   Sinuses/Orbits: No middle ear or mastoid effusion. Paranasal sinuses are notable for complete opacification of the right frontal sinus. Orbits are unremarkable.   Other: None.   IMPRESSION: No acute intracranial abnormality. Interval decrease in size of left basal ganglia hemorrhage, now measuring 10 x 7 mm, previously 20 x 12 mm. Resolution of intraventricular blood products. No hydrocephalus.    PATIENT SURVEYS:    Patient-Specific Activity Scoring Scheme  0 represents "unable to perform." 10 represents "able to perform at prior level. 0 1 2 3 4 5 6 7 8 9  10 (Date and Score)   Activity Eval  12/03/23  12/24/23 01/21/24  1. Walking straight without the  cane  4   6 6-7 6  2. Strength in RLE   4 6  5 7   3. Coordinated movements of R LE  3 6 3 5   4.      5.      Score 3.7 6 4.6 6   Total score = sum of the activity scores/number of activities Minimum detectable change (90%CI) for average score = 2 points Minimum detectable change (90%CI) for single activity score = 3 points     COGNITION: Overall cognitive status: Within functional limits for tasks assessed     SENSATION: Not tested      LOWER EXTREMITY MMT:  MMT Right eval Left eval 01/02/24 R  01/21/24 R  Hip flexion 4 5 4+ 4  Hip extension      Hip abduction      Hip adduction      Hip internal rotation      Hip external rotation      Knee flexion 4 5 4+ 4+  Knee extension 4 5 4+ 4+  Ankle dorsiflexion 1 at best (in AFO, reports no movement in ankle)  DNT, in AFO  DNT  Ankle plantarflexion      Ankle inversion      Ankle eversion       (Blank rows = not tested)    FUNCTIONAL TESTS:  5 times sit to stand: 12.8 seconds some  off shift from R LE  Timed up and go (TUG): deferred  3 minute walk test: 257ft [facility]      01/14/24 0001  Dynamic Gait Index  Level Surface 2  Change in Gait Speed 2  Gait with Horizontal Head Turns 2  Gait with Vertical Head Turns 2  Gait and Pivot Turn 1  Step Over Obstacle 1  Step Around Obstacles 2  Steps 1  Total Score 13  DGI comment: fatigued today, didn't sleep well      GAIT: Distance walked: 23ft  Assistive device utilized: Single point cane Level of assistance: Modified independence Comments: wide BOS, increased tone in R LE with gait pattern- limited knee ROM and ankle tends to invert a bit , mild unsteadiness with corners, limited rotation at trunk/hips                                                                                                                                 TREATMENT DATE:   01/21/24  MMT, PSFS, goal check, education on progress with PT and transition from 6 month window to a year after a CVA, likely benefit of transition to long term HEP, if MD changes any meds or spasticity management significantly could consider extension of PT    Tandem walk x4 laps // bars Side steps in // bars x4 laps  Forward lunges onto BOSU x10 B light BUE support   Nustep L6x6 minutes BLEs only    01/16/24  AFO off in // bars/NMR for  hemi-ankle:  - mini-lunge onto blue air pad x12  - assisted PF/DF on rockerboard with assist from L LE and BUE support (tried to have R ankle do approximately 75% of effort) x10 had issues with knee wanting to buckle without AFO on  - assisted inversion/eversion on rockerboard with assist from L LE and BUE support (R ankle doing about 75% of effort), less knee flexion moment  Nustep L6x8 minutes BLEs only  Gait outside focus on hill navigation (L side of parking lot) cane and general S/occasional Min guard, did need MinA/Min cues for safe curb navigation        01/14/24   Gait without AFO- continued to note  ongoing inversion tone, pt also reports toes curling at times. When he is distracted, does have reduced ankle DF but did not catch foot or trip. Education on AFO, could consider going back by [employer] to see if they feel if his brace needs modification (different build to help control inversion?), encouraged AFO wear out of house to avoid toe catching in busy environments  Nustep L6x8 minutes BLEs only, seat 7 DGI  Sidesteps blue foam pad x3 laps min guard  Cross midline reaches on blue foam pad randomized order min guard   Heel-toe rocks R LE on blue air pad            PATIENT EDUCATION:  Education details: exam findings, POC, HEP, otherwise as above  Person educated: Patient Education method: Programmer, multimedia, Facilities manager, and Handouts Education comprehension: verbalized understanding, returned demonstration, and needs further education  HOME EXERCISE PROGRAM:  Access Code: [email or web address] URL: [email or web address] Date: 01/21/2024 Prepared by: Terrel Ferries  Exercises - Heel Toe Raises with Counter Support  - 1 x daily - 7 x weekly - 2 sets - 10 reps - Stride Stance Weight Shift  - 1 x daily - 7 x weekly - 2 sets - 10 reps - Single Leg Balance with Clock Reach  - 1 x daily - 7 x weekly - 2 sets - 10 reps - Sit to Stand with Resistance Around Legs  - 1 x daily - 7 x weekly - 2 sets - 10 reps - Forward Step Over with Counter Support  - 1 x daily - 7 x weekly - 2 sets - 10 reps - Backward Step Over with Counter Support  - 1 x daily - 7 x weekly - 2 sets - 10 reps - Standing Tandem Balance with Counter Support  - 1 x daily - 7 x weekly - 1 sets - 6 reps - 30 seconds  hold - Tandem Walking with Counter Support  - 1 x daily - 7 x weekly - 1 sets - 3-4 reps - Side Stepping with Counter Support  - 1 x daily - 7 x weekly - 1 sets - 3-4 reps   ASSESSMENT:  CLINICAL IMPRESSION:    Completed 20th visit progress note today- unfortunately rate of functional, measurable  progress has slowed down a bit. He is about 6 months out from his CVA at this point and may realistically benefit more from transition to long term extensive HEP rather than significant extension of skilled PT services at this point. He has 3 more visits scheduled- will use these to expand HEP and transition towards DC. That being said, if MD significantly changes meds or spasticity management at upcoming visit on the 20th, could consider a longer extension of care, TBD.             From eval:  Patient is a 68 y.o. M who was seen today for physical therapy evaluation and treatment for I61.0 (ICD-10-CM) - Nontraumatic subcortical hemorrhage of left cerebral hemisphere [facility]). He is very motivated and moving a very high level given the hemorrhagic CVA. His main concerns are balance/coordination and getting back to walking without assistive device. Anticipate he will do very well with skilled PT services.   OBJECTIVE IMPAIRMENTS: Abnormal gait, decreased activity tolerance, decreased balance, decreased coordination, decreased knowledge of use of DME, decreased mobility, difficulty walking, decreased strength, and decreased safety awareness.   ACTIVITY LIMITATIONS: standing, squatting, stairs, transfers, and locomotion level  PARTICIPATION LIMITATIONS: driving, shopping, community activity, and yard work  PERSONAL FACTORS: Age, Behavior pattern, Fitness, Past/current experiences, Social background, Time since onset of injury/illness/exacerbation, and Transportation are also affecting patient's functional outcome.   REHAB POTENTIAL: Good  CLINICAL DECISION MAKING: Evolving/moderate complexity  EVALUATION COMPLEXITY: Moderate   GOALS: Goals reviewed with patient? No  SHORT TERM GOALS: Target date: 01/30/2024     Will be compliant with appropriate progressive HEP  Baseline: Goal status: MET 11/28/23  2.  Will score 14/24 on DGI  Baseline: 10 Goal status: MET 11/28/23 15/24  3.  Will be able to  perform all functional transfers without compensation strategies or offshift from hemi LE  Baseline:  Goal status:  01/21/24 MET during unofficial 5xSTS   4.  Will be able to ambulate household distances without AD, Mod(I) Baseline:  Goal status: PARTIALLY MET 01/21/24 75% of the time     LONG TERM GOALS: Target date: 02/27/2024      MMT in R LE to be at least 4+/5 in hip and knee musculature  Baseline:  Goal status: 01/21/24 PARTIALLY MET   2.  Will score 18/24 on DGI to show improved functional balance  Baseline:  Goal status: 01/21/24 ONGOING   3.  Will be able to walk at least 222ft over uneven/unsteady surfaces without device and no more than S/ 564ft over even surfaces no device Mod(I) Baseline:  Goal status: 01/21/24- PARTIALLY MET needs cane for unsteady surfaces   4.  Will be able to ascend and descend steps without rail and minimal compensation strategies  Baseline:  Goal status: ONGOING 01/21/24   5.  Will be compliant with advanced HEP vs gym based program at DC to maintain functional gains  Baseline:  Goal status: 12/24/23: has a [employer] membership and has not utlized yet  6.  PSFS to improve by at least 3 points  Baseline: 3.7 Goal status: ONGOING 01/21/24   PLAN:  PT FREQUENCY: 2x/week  PT DURATION: 8 weeks  PLANNED INTERVENTIONS: 97164- PT Re-evaluation, 97110-Therapeutic exercises, 97530- Therapeutic activity, 97112- Neuromuscular re-education, 97535- Self Care, 16109- Manual therapy, [id number]- Gait training, [phone]- Orthotic Fit/training, [phone]- Aquatic Therapy, Stair training, Taping, Dry Needling, Cryotherapy, and Moist heat  PLAN FOR NEXT SESSION: potentially planning for 3 more visits then DC to advanced HEP- but sees MD on the 20th, if any meds or spasticity management is changed could then consider extending POC, but otherwise DC   Terrel Ferries, PT, DPT 01/21/24 10:14 AM

## 2024-01-22 ENCOUNTER — Ambulatory Visit: Payer: Self-pay | Admitting: Cardiology

## 2024-01-23 ENCOUNTER — Ambulatory Visit: Admitting: Physical Therapy

## 2024-01-23 ENCOUNTER — Encounter: Payer: Self-pay | Admitting: Physical Therapy

## 2024-01-23 DIAGNOSIS — M6281 Muscle weakness (generalized): Secondary | ICD-10-CM

## 2024-01-23 DIAGNOSIS — R2681 Unsteadiness on feet: Secondary | ICD-10-CM

## 2024-01-23 DIAGNOSIS — R278 Other lack of coordination: Secondary | ICD-10-CM

## 2024-01-23 DIAGNOSIS — R2689 Other abnormalities of gait and mobility: Secondary | ICD-10-CM

## 2024-01-23 NOTE — Therapy (Signed)
 OUTPATIENT PHYSICAL THERAPY TREATMENT    Patient Name: Adrian [name] MRN: [id number] DOB:May 01, 1956, 68 y.o., male Today's Date: 01/23/2024          END OF SESSION:  PT End of Session - 01/23/24 1012     Visit Number 21    Number of Visits 23    Date for PT Re-Evaluation 01/30/24    Authorization Type MCR    Authorization Time Period 10/31/23 to 12/26/23; extended to 02/27/24    Progress Note Due on Visit 30    PT Start Time 0932    PT Stop Time 1011    PT Time Calculation (min) 39 min    Activity Tolerance Patient tolerated treatment well    Behavior During Therapy [facility] for tasks assessed/performed                              Past Medical History:  Diagnosis Date   AF (paroxysmal atrial fibrillation) (HCC) 11/29/2018   Atrial flutter (HCC) 10/12/2015   a. TEE 3/17 with ? LAA clot-->s/p TEE/DCCV 11/24/2015; s/p DCCV 12/2022;s/p ablation 06/2023   CAD (coronary artery disease), native coronary artery 12/06/2015   a. 10/2015 MV: EF  37%, reversible defect inferior apex, intermediate risk findings; b. 10/2015 Cath: 20% mid RCA.   Carotid artery stenosis    1-39% right ICA stenosis and occluded left ICA   Colonic diverticular abscess    Diverticulitis    History of chemotherapy 2005   Cisplatin   Hypothyroidism    NICM (nonischemic cardiomyopathy) (HCC) 10/14/2015   a. Tachy mediated?;  b. Echo 3/17 - Mild concentric LVH, EF 30-35%, anteroseptal, anterior, anterolateral, apical anterior, lateral hypokinesis, trivial MR, mild to moderately reduced RVSF; c. LHC 3/17 - mRCA 20%   Radiation NOv.3,2005-Dec. 15, 2005   6810 cGy in 30 fractions   Tonsil cancer [facility]) 2005   Dr Ava Lei.  XRT   Past Surgical History:  Procedure Laterality Date   A-FLUTTER ABLATION N/A 06/19/2023   Procedure: A-FLUTTER ABLATION;  Surgeon: Boyce Byes, MD;  Location: [facility] INVASIVE CV LAB;  Service: Cardiovascular;  Laterality: N/A;   APPENDECTOMY N/A 03/07/2019    Procedure: ROBOTIC ASSISTED APPENDECTOMY;  Surgeon: Candyce Champagne, MD;  Location: [facility] ORS;  Service: General;  Laterality: N/A;   CARDIAC CATHETERIZATION N/A 10/18/2015   Procedure: Left Heart Cath and Coronary Angiography;  Surgeon: Odie Benne, MD;  Location: [facility] INVASIVE CV LAB;  Service: Cardiovascular;  Laterality: N/A;   CARDIOVERSION N/A 11/24/2015   Procedure: CARDIOVERSION;  Surgeon: Loyde Rule, MD;  Location: [facility] ENDOSCOPY;  Service: Cardiovascular;  Laterality: N/A;   CARDIOVERSION N/A 12/20/2022   Procedure: CARDIOVERSION;  Surgeon: Bridgette Campus, MD;  Location: [facility] INVASIVE CV LAB;  Service: Cardiovascular;  Laterality: N/A;   CYSTOSCOPY WITH STENT PLACEMENT Bilateral 03/07/2019   Procedure: CYSTOSCOPY WITH BILATERAL FIREFLY INJECTION;  Surgeon: Andrez Banker, MD;  Location: [facility] ORS;  Service: Urology;  Laterality: Bilateral;   GASTROSTOMY TUBE PLACEMENT  07/04/2004   IR - G tube for tonsilar cancer   IR ANGIO INTRA EXTRACRAN SEL COM CAROTID INNOMINATE UNI R MOD SED  03/09/2019   IR ANGIO VERTEBRAL SEL VERTEBRAL UNI R MOD SED  03/09/2019   IR CT HEAD LTD  03/09/2019   IR PERCUTANEOUS ART THROMBECTOMY/INFUSION INTRACRANIAL INC DIAG ANGIO  03/09/2019   IR RADIOLOGIST EVAL & MGMT  12/18/2018   LAMINECTOMY     C5/placement of steel  plate   LOOP RECORDER INSERTION N/A 06/19/2023   Procedure: LOOP RECORDER INSERTION;  Surgeon: Boyce Byes, MD;  Location: [facility] INVASIVE CV LAB;  Service: Cardiovascular;  Laterality: N/A;   NECK SURGERY  2003   replaced disk   RADIOLOGY WITH ANESTHESIA N/A 03/08/2019   Procedure: IR WITH ANESTHESIA;  Surgeon: Luellen Sages, MD;  Location: [facility] OR;  Service: Radiology;  Laterality: N/A;   TEE WITHOUT CARDIOVERSION N/A 10/15/2015   Procedure: TRANSESOPHAGEAL ECHOCARDIOGRAM (TEE);  Surgeon: Darlis Eisenmenger, MD;  Location: [facility] ENDOSCOPY;  Service: Cardiovascular;  Laterality: N/A;   TEE WITHOUT CARDIOVERSION N/A 11/24/2015   Procedure:  TRANSESOPHAGEAL ECHOCARDIOGRAM (TEE);  Surgeon: Loyde Rule, MD;  Location: [facility] ENDOSCOPY;  Service: Cardiovascular;  Laterality: N/A;   TEE WITHOUT CARDIOVERSION N/A 12/20/2022   Procedure: TRANSESOPHAGEAL ECHOCARDIOGRAM;  Surgeon: Bridgette Campus, MD;  Location: [facility] INVASIVE CV LAB;  Service: Cardiovascular;  Laterality: N/A;   XI ROBOTIC ASSISTED COLOSTOMY TAKEDOWN N/A 03/07/2019   Procedure: XI ROBOTIC ASSISTED LOW ANTERIOR RESECTION, RIGID PROCTOSCOPY;  Surgeon: Candyce Champagne, MD;  Location: [facility] ORS;  Service: General;  Laterality: N/A;   Patient Active Problem List   Diagnosis Date Noted   Carotid occlusion, left 10/23/2023   ICH (intracerebral hemorrhage) (HCC) 08/11/2023   Carotid artery stenosis    Atrial flutter (HCC) 12/19/2022   Essential hypertension 04/22/2021   Hypokalemia 03/12/2019   Expressive aphasia 03/10/2019   Dysphagia 03/10/2019   Middle cerebral artery embolism, left 03/09/2019   Diverticular stricture (HCC) 03/07/2019   Bacteria in urine    Chronic atrial fibrillation (HCC) 12/02/2018   Current use of long term anticoagulation 12/02/2018   UTI (urinary tract infection) 11/29/2018   Hyponatremia 11/29/2018   PAF (paroxysmal atrial fibrillation) (HCC) 11/29/2018   Diverticulitis of large intestine with abscess 09/23/2018   Dyslipidemia 12/05/2017   CAD (coronary artery disease), native coronary artery 12/06/2015   SOB (shortness of breath)    NICM (nonischemic cardiomyopathy) (HCC)    Thyroid  activity decreased    Hypothyroidism 08/12/2013   History of radiation therapy 05/30/2012   Smokes tobacco daily 05/30/2012   Cancer of tonsillar fossa (HCC) 11/27/2011   Tonsil cancer (HCC) 2005   History of chemotherapy 2005    PCP: Glean Lamy MD   REFERRING PROVIDER: Genetta Kenning, MD  REFERRING DIAG: I61.0 (ICD-10-CM) - Nontraumatic subcortical hemorrhage of left cerebral hemisphere [facility])  THERAPY DIAG:  Muscle weakness (generalized)  Other  abnormalities of gait and mobility  Unsteadiness on feet  Other lack of coordination  Rationale for Evaluation and Treatment: Rehabilitation  ONSET DATE: 08/11/23  SUBJECTIVE:   SUBJECTIVE STATEMENT:  Nothing new since last visit      PERTINENT HISTORY: 1.  History of left basal ganglia hemorrhage approximately 2 and half months ago.  He has completed inpatient rehabilitation as well as home health.  I do think he would be a great candidate for outpatient therapy.  He has gotten back to modified independent level but still has some mild weakness on the right side as well as fine motor issues and motor control issues.  His balance is also off.  He continues wear right AFO because of foot and ankle weakness.  Patient has some short-term memory issues as well Will make referral for outpatient PT OT and speech therapy Physical medicine rehab follow-up in 3 months  PAIN:  Are you having pain? No  0/10 today  PRECAUTIONS: Fall and Other: R hemi, has loop recorder  RED FLAGS: None   WEIGHT BEARING RESTRICTIONS: No  FALLS:  Has patient fallen in last 6 months? No  LIVING ENVIRONMENT: Lives with: lives alone Lives in: House/apartment Stairs: 2 STE with rail  Has following equipment at home: Single point cane and Environmental consultant - 2 wheeled  OCCUPATION: retired- used to work for [employer]   PLOF: Independent, Independent with basic ADLs, Independent with gait, Independent with transfers, and Requires assistive device for independence  PATIENT GOALS: walk straight without a cane, get back to driving and motorcycling   NEXT MD VISIT: Referring 01/25/24  OBJECTIVE:  Note: Objective measures were completed at Evaluation unless otherwise noted.  DIAGNOSTIC FINDINGS:   CLINICAL DATA:  Stroke, follow up Neuro deficit, acute, stroke suspected   EXAM: MRI HEAD WITHOUT AND WITH CONTRAST   TECHNIQUE: Multiplanar, multiecho pulse sequences of the brain and surrounding structures were  obtained without and with intravenous contrast.   CONTRAST:  6.5mL GADAVIST  GADOBUTROL  1 MMOL/ML IV SOLN   COMPARISON:  CT head from today.   FINDINGS: Brain: When comparing across modalities, no substantial change in intraparenchymal hemorrhage in the left basal ganglia with intraventricular extension. No visible surrounding acute hemorrhage or mass lesion; however, acute blood products limits assessment. Prior left frontal infarct with encephalomalacia. No hydrocephalus. No pathologic enhancement.   Vascular: Major arterial flow voids are maintained at the skull base.   Skull and upper cervical spine: Normal marrow signal.   Sinuses/Orbits: Right frontal sinus opacification. Remaining sinuses are clear. No acute orbital findings.   Other: No mastoid effusions.   IMPRESSION: 1. When comparing across modalities, no substantial change in intraparenchymal hemorrhage in the left basal ganglia with intraventricular extension. No progressive mass effect. 2. No visible surrounding acute hemorrhage or mass lesion; however, acute blood products limits assessment  EXAM: CT HEAD WITHOUT CONTRAST   TECHNIQUE: Contiguous axial images were obtained from the base of the skull through the vertex without intravenous contrast.   RADIATION DOSE REDUCTION: This exam was performed according to the departmental dose-optimization program which includes automated exposure control, adjustment of the mA and/or kV according to patient size and/or use of iterative reconstruction technique.   COMPARISON:  Head CT 08/12/2023   FINDINGS: Brain: Interval decrease in size of the previously seen hemorrhage in the left basal ganglia, now measuring 10 x 7 mm, previously 20 x 12 mm. There is resolution of intraventricular blood products. No hydrocephalus. No extra-axial fluid collection. No mass effect. No mass lesion. No CT evidence of an acute cortical infarct. There is chronic left MCA territory  infarct involving the left opercular region.   Vascular: No hyperdense vessel or unexpected calcification.   Skull: Normal. Negative for fracture or focal lesion.   Sinuses/Orbits: No middle ear or mastoid effusion. Paranasal sinuses are notable for complete opacification of the right frontal sinus. Orbits are unremarkable.   Other: None.   IMPRESSION: No acute intracranial abnormality. Interval decrease in size of left basal ganglia hemorrhage, now measuring 10 x 7 mm, previously 20 x 12 mm. Resolution of intraventricular blood products. No hydrocephalus.    PATIENT SURVEYS:    Patient-Specific Activity Scoring Scheme  0 represents "unable to perform." 10 represents "able to perform at prior level. 0 1 2 3 4 5 6 7 8 9  10 (Date and Score)   Activity Eval  12/03/23  12/24/23 01/21/24  1. Walking straight without the cane  4   6 6-7 6  2. Strength in RLE   4 6  5 7  3. Coordinated movements of R LE  3 6 3 5   4.      5.      Score 3.7 6 4.6 6   Total score = sum of the activity scores/number of activities Minimum detectable change (90%CI) for average score = 2 points Minimum detectable change (90%CI) for single activity score = 3 points     COGNITION: Overall cognitive status: Within functional limits for tasks assessed     SENSATION: Not tested      LOWER EXTREMITY MMT:  MMT Right eval Left eval 01/02/24 R  01/21/24 R  Hip flexion 4 5 4+ 4  Hip extension      Hip abduction      Hip adduction      Hip internal rotation      Hip external rotation      Knee flexion 4 5 4+ 4+  Knee extension 4 5 4+ 4+  Ankle dorsiflexion 1 at best (in AFO, reports no movement in ankle)  DNT, in AFO  DNT  Ankle plantarflexion      Ankle inversion      Ankle eversion       (Blank rows = not tested)    FUNCTIONAL TESTS:  5 times sit to stand: 12.8 seconds some off shift from R LE  Timed up and go (TUG): deferred  3 minute walk test: 234ft [facility]      01/14/24  0001  Dynamic Gait Index  Level Surface 2  Change in Gait Speed 2  Gait with Horizontal Head Turns 2  Gait with Vertical Head Turns 2  Gait and Pivot Turn 1  Step Over Obstacle 1  Step Around Obstacles 2  Steps 1  Total Score 13  DGI comment: fatigued today, didn't sleep well      GAIT: Distance walked: 288ft  Assistive device utilized: Single point cane Level of assistance: Modified independence Comments: wide BOS, increased tone in R LE with gait pattern- limited knee ROM and ankle tends to invert a bit , mild unsteadiness with corners, limited rotation at trunk/hips                                                                                                                                 TREATMENT DATE:    01/23/24  Out of AFO: Forward lunges onto BOSU x10 B Lateral lunges onto BOSU x10 B  Nustep L7 x4 minutes BLEs only, then L6x4 more minutes  Gait outdoors: curbs with Min cues for sequencing/safety, hills, grass/mulch in brace      01/21/24  MMT, PSFS, goal check, education on progress with PT and transition from 6 month window to a year after a CVA, likely benefit of transition to long term HEP, if MD changes any meds or spasticity management significantly could consider extension of PT    Tandem walk x4 laps // bars Side steps in // bars x4  laps  Forward lunges onto BOSU x10 B light BUE support   Nustep L6x6 minutes BLEs only    01/16/24  AFO off in // bars/NMR for hemi-ankle:  - mini-lunge onto blue air pad x12  - assisted PF/DF on rockerboard with assist from L LE and BUE support (tried to have R ankle do approximately 75% of effort) x10 had issues with knee wanting to buckle without AFO on  - assisted inversion/eversion on rockerboard with assist from L LE and BUE support (R ankle doing about 75% of effort), less knee flexion moment  Nustep L6x8 minutes BLEs only  Gait outside focus on hill navigation (L side of parking lot) cane and general  S/occasional Min guard, did need MinA/Min cues for safe curb navigation        01/14/24   Gait without AFO- continued to note ongoing inversion tone, pt also reports toes curling at times. When he is distracted, does have reduced ankle DF but did not catch foot or trip. Education on AFO, could consider going back by [employer] to see if they feel if his brace needs modification (different build to help control inversion?), encouraged AFO wear out of house to avoid toe catching in busy environments  Nustep L6x8 minutes BLEs only, seat 7 DGI  Sidesteps blue foam pad x3 laps min guard  Cross midline reaches on blue foam pad randomized order min guard   Heel-toe rocks R LE on blue air pad            PATIENT EDUCATION:  Education details: exam findings, POC, HEP, otherwise as above  Person educated: Patient Education method: Programmer, multimedia, Facilities manager, and Handouts Education comprehension: verbalized understanding, returned demonstration, and needs further education  HOME EXERCISE PROGRAM:  Access Code: [email or web address] URL: [email or web address] Date: 01/21/2024 Prepared by: Terrel Ferries  Exercises - Heel Toe Raises with Counter Support  - 1 x daily - 7 x weekly - 2 sets - 10 reps - Stride Stance Weight Shift  - 1 x daily - 7 x weekly - 2 sets - 10 reps - Single Leg Balance with Clock Reach  - 1 x daily - 7 x weekly - 2 sets - 10 reps - Sit to Stand with Resistance Around Legs  - 1 x daily - 7 x weekly - 2 sets - 10 reps - Forward Step Over with Counter Support  - 1 x daily - 7 x weekly - 2 sets - 10 reps - Backward Step Over with Counter Support  - 1 x daily - 7 x weekly - 2 sets - 10 reps - Standing Tandem Balance with Counter Support  - 1 x daily - 7 x weekly - 1 sets - 6 reps - 30 seconds  hold - Tandem Walking with Counter Support  - 1 x daily - 7 x weekly - 1 sets - 3-4 reps - Side Stepping with Counter Support  - 1 x daily - 7 x weekly - 1 sets - 3-4  reps   ASSESSMENT:  CLINICAL IMPRESSION:    Kept working on functional exercises outside of AFO to promote ankle strength and stability but continued to promote brace wear on his own, as he is a big fall risk due to poor foot clearance and knee buckling especially when fatigued. Progressed Nustep, also kept working on gait outdoors and curb navigation. Awaiting MD update later in the week.             From eval: Patient is a 68 y.o.  M who was seen today for physical therapy evaluation and treatment for I61.0 (ICD-10-CM) - Nontraumatic subcortical hemorrhage of left cerebral hemisphere [facility]). He is very motivated and moving a very high level given the hemorrhagic CVA. His main concerns are balance/coordination and getting back to walking without assistive device. Anticipate he will do very well with skilled PT services.   OBJECTIVE IMPAIRMENTS: Abnormal gait, decreased activity tolerance, decreased balance, decreased coordination, decreased knowledge of use of DME, decreased mobility, difficulty walking, decreased strength, and decreased safety awareness.   ACTIVITY LIMITATIONS: standing, squatting, stairs, transfers, and locomotion level  PARTICIPATION LIMITATIONS: driving, shopping, community activity, and yard work  PERSONAL FACTORS: Age, Behavior pattern, Fitness, Past/current experiences, Social background, Time since onset of injury/illness/exacerbation, and Transportation are also affecting patient's functional outcome.   REHAB POTENTIAL: Good  CLINICAL DECISION MAKING: Evolving/moderate complexity  EVALUATION COMPLEXITY: Moderate   GOALS: Goals reviewed with patient? No  SHORT TERM GOALS: Target date: 01/30/2024     Will be compliant with appropriate progressive HEP  Baseline: Goal status: MET 11/28/23  2.  Will score 14/24 on DGI  Baseline: 10 Goal status: MET 11/28/23 15/24  3.  Will be able to perform all functional transfers without compensation strategies or  offshift from hemi LE  Baseline:  Goal status:  01/21/24 MET during unofficial 5xSTS   4.  Will be able to ambulate household distances without AD, Mod(I) Baseline:  Goal status: PARTIALLY MET 01/21/24 75% of the time     LONG TERM GOALS: Target date: 02/27/2024      MMT in R LE to be at least 4+/5 in hip and knee musculature  Baseline:  Goal status: 01/21/24 PARTIALLY MET   2.  Will score 18/24 on DGI to show improved functional balance  Baseline:  Goal status: 01/21/24 ONGOING   3.  Will be able to walk at least 2108ft over uneven/unsteady surfaces without device and no more than S/ 554ft over even surfaces no device Mod(I) Baseline:  Goal status: 01/21/24- PARTIALLY MET needs cane for unsteady surfaces   4.  Will be able to ascend and descend steps without rail and minimal compensation strategies  Baseline:  Goal status: ONGOING 01/21/24   5.  Will be compliant with advanced HEP vs gym based program at DC to maintain functional gains  Baseline:  Goal status: 12/24/23: has a [employer] membership and has not utlized yet  6.  PSFS to improve by at least 3 points  Baseline: 3.7 Goal status: ONGOING 01/21/24   PLAN:  PT FREQUENCY: 2x/week  PT DURATION: 8 weeks  PLANNED INTERVENTIONS: [id number]- PT Re-evaluation, 97110-Therapeutic exercises, 97530- Therapeutic activity, 97112- Neuromuscular re-education, 97535- Self Care, 10960- Manual therapy, [id number]- Gait training, [phone]- Orthotic Fit/training, [phone]- Aquatic Therapy, Stair training, Taping, Dry Needling, Cryotherapy, and Moist heat  PLAN FOR NEXT SESSION: potentially planning for  2  more visits then DC to advanced HEP- but sees MD on 6/20, if any meds or spasticity management is changed could then consider extending POC, but otherwise DC. Keep working on outdoor navigation  [place], [place], DPT 01/23/24 10:12 AM

## 2024-01-25 ENCOUNTER — Encounter: Payer: Self-pay | Admitting: Physical Medicine & Rehabilitation

## 2024-01-25 ENCOUNTER — Encounter: Attending: Registered Nurse | Admitting: Physical Medicine & Rehabilitation

## 2024-01-25 VITALS — BP 129/81 | HR 67 | Ht 68.0 in | Wt 143.6 lb

## 2024-01-25 DIAGNOSIS — G8111 Spastic hemiplegia affecting right dominant side: Secondary | ICD-10-CM | POA: Insufficient documentation

## 2024-01-25 DIAGNOSIS — I610 Nontraumatic intracerebral hemorrhage in hemisphere, subcortical: Secondary | ICD-10-CM | POA: Insufficient documentation

## 2024-01-25 NOTE — Progress Notes (Signed)
 Subjective:    Patient ID: Adrian [name], male    DOB: 1955-10-16, 68 y.o.   MRN: [id number]  HPI  67 year old male with a history of left basal ganglia hemorrhage on 08/12/2023 is here to follow-up on functional progress.  He is currently undergoing outpatient rehabilitation.  He had completed his inpatient rehabilitation on 09/05/2023 RUE doing a lot better , RLE doing marginally better No pain issues Patient is somewhat disappointed that his right lower extremity is not stronger.  We discussed his initial functional status upon admission to the inpatient rotation unit on 08/14/2023.  He at that time was requiring moderate assistance of 2 people to ambulate 3 feet.  He was somewhat surprised that how much help he needed at that time. Pain Inventory Average Pain 0 Pain Right Now 0 My pain is no pain  In the last 24 hours, has pain interfered with the following? General activity 3 Relation with others 5 Enjoyment of life 3 What TIME of day is your pain at its worst? No pain Sleep (in general) Fair  Pain is worse with: no pain Pain improves with: no pain Relief from Meds: no pain  Family History  Problem Relation Age of Onset   Prostate cancer Father    Colon cancer Father    Skin cancer Mother    Cancer Brother    Esophageal cancer Neg Hx    Rectal cancer Neg Hx    Stomach cancer Neg Hx    Social History   Socioeconomic History   Marital status: Married    Spouse name: Not on file   Number of children: Not on file   Years of education: Not on file   Highest education level: Not on file  Occupational History   Not on file  Tobacco Use   Smoking status: Former    Current packs/day: 0.00    Average packs/day: 0.5 packs/day for 20.0 years (10.0 ttl pk-yrs)    Types: Cigarettes    Quit date: 08/11/2023    Years since quitting: 0.4   Smokeless tobacco: Never   Tobacco comments:    1/2 pack per day  Vaping Use   Vaping status: Never Used  Substance and Sexual Activity    Alcohol use: Yes    Alcohol/week: 12.0 standard drinks of alcohol    Types: 12 Cans of beer per week    Comment: couple of beers each day   Drug use: No   Sexual activity: Not on file  Other Topics Concern   Not on file  Social History Narrative   Not on file   Social Drivers of Health   Financial Resource Strain: Not on file  Food Insecurity: No Food Insecurity (08/11/2023)   Hunger Vital Sign    Worried About Running Out of Food in the Last Year: Never true    Ran Out of Food in the Last Year: Never true  Transportation Needs: No Transportation Needs (08/11/2023)   PRAPARE - Administrator, Civil Service (Medical): No    Lack of Transportation (Non-Medical): No  Physical Activity: Not on file  Stress: Not on file  Social Connections: Moderately Integrated (08/11/2023)   Social Connection and Isolation Panel    Frequency of Communication with Friends and Family: Three times a week    Frequency of Social Gatherings with Friends and Family: Once a week    Attends Religious Services: 1 to 4 times per year    Active Member of Clubs or  Organizations: Yes    Attends Banker Meetings: 1 to 4 times per year    Marital Status: Divorced   Past Surgical History:  Procedure Laterality Date   A-FLUTTER ABLATION N/A 06/19/2023   Procedure: A-FLUTTER ABLATION;  Surgeon: Boyce Byes, MD;  Location: [facility] INVASIVE CV LAB;  Service: Cardiovascular;  Laterality: N/A;   APPENDECTOMY N/A 03/07/2019   Procedure: ROBOTIC ASSISTED APPENDECTOMY;  Surgeon: Candyce Champagne, MD;  Location: [facility] ORS;  Service: General;  Laterality: N/A;   CARDIAC CATHETERIZATION N/A 10/18/2015   Procedure: Left Heart Cath and Coronary Angiography;  Surgeon: Odie Benne, MD;  Location: [facility] INVASIVE CV LAB;  Service: Cardiovascular;  Laterality: N/A;   CARDIOVERSION N/A 11/24/2015   Procedure: CARDIOVERSION;  Surgeon: Loyde Rule, MD;  Location: [facility] ENDOSCOPY;  Service: Cardiovascular;   Laterality: N/A;   CARDIOVERSION N/A 12/20/2022   Procedure: CARDIOVERSION;  Surgeon: Bridgette Campus, MD;  Location: [facility] INVASIVE CV LAB;  Service: Cardiovascular;  Laterality: N/A;   CYSTOSCOPY WITH STENT PLACEMENT Bilateral 03/07/2019   Procedure: CYSTOSCOPY WITH BILATERAL FIREFLY INJECTION;  Surgeon: Andrez Banker, MD;  Location: [facility];  Service: Urology;  Laterality: Bilateral;   GASTROSTOMY TUBE PLACEMENT  07/04/2004   IR - G tube for tonsilar cancer   IR ANGIO INTRA EXTRACRAN SEL COM CAROTID INNOMINATE UNI R MOD SED  03/09/2019   IR ANGIO VERTEBRAL SEL VERTEBRAL UNI R MOD SED  03/09/2019   IR CT HEAD LTD  03/09/2019   IR PERCUTANEOUS ART THROMBECTOMY/INFUSION INTRACRANIAL INC DIAG ANGIO  03/09/2019   IR RADIOLOGIST EVAL & MGMT  12/18/2018   LAMINECTOMY     C5/placement of steel plate   LOOP RECORDER INSERTION N/A 06/19/2023   Procedure: LOOP RECORDER INSERTION;  Surgeon: Boyce Byes, MD;  Location: [facility] INVASIVE CV LAB;  Service: Cardiovascular;  Laterality: N/A;   NECK SURGERY  2003   replaced disk   RADIOLOGY WITH ANESTHESIA N/A 03/08/2019   Procedure: IR WITH ANESTHESIA;  Surgeon: Luellen Sages, MD;  Location: [facility] OR;  Service: Radiology;  Laterality: N/A;   TEE WITHOUT CARDIOVERSION N/A 10/15/2015   Procedure: TRANSESOPHAGEAL ECHOCARDIOGRAM (TEE);  Surgeon: Darlis Eisenmenger, MD;  Location: [facility] ENDOSCOPY;  Service: Cardiovascular;  Laterality: N/A;   TEE WITHOUT CARDIOVERSION N/A 11/24/2015   Procedure: TRANSESOPHAGEAL ECHOCARDIOGRAM (TEE);  Surgeon: Loyde Rule, MD;  Location: [facility] ENDOSCOPY;  Service: Cardiovascular;  Laterality: N/A;   TEE WITHOUT CARDIOVERSION N/A 12/20/2022   Procedure: TRANSESOPHAGEAL ECHOCARDIOGRAM;  Surgeon: Bridgette Campus, MD;  Location: [facility] INVASIVE CV LAB;  Service: Cardiovascular;  Laterality: N/A;   XI ROBOTIC ASSISTED COLOSTOMY TAKEDOWN N/A 03/07/2019   Procedure: XI ROBOTIC ASSISTED LOW ANTERIOR RESECTION, RIGID PROCTOSCOPY;  Surgeon: Candyce Champagne, MD;   Location: [facility]S;  Service: General;  Laterality: N/A;   Past Surgical History:  Procedure Laterality Date   A-FLUTTER ABLATION N/A 06/19/2023   Procedure: A-FLUTTER ABLATION;  Surgeon: Boyce Byes, MD;  Location: [facility] INVASIVE CV LAB;  Service: Cardiovascular;  Laterality: N/A;   APPENDECTOMY N/A 03/07/2019   Procedure: ROBOTIC ASSISTED APPENDECTOMY;  Surgeon: Candyce Champagne, MD;  Location: [facility];  Service: General;  Laterality: N/A;   CARDIAC CATHETERIZATION N/A 10/18/2015   Procedure: Left Heart Cath and Coronary Angiography;  Surgeon: Odie Benne, MD;  Location: [facility] INVASIVE CV LAB;  Service: Cardiovascular;  Laterality: N/A;   CARDIOVERSION N/A 11/24/2015   Procedure: CARDIOVERSION;  Surgeon: Loyde Rule, MD;  Location: [facility] ENDOSCOPY;  Service:  Cardiovascular;  Laterality: N/A;   CARDIOVERSION N/A 12/20/2022   Procedure: CARDIOVERSION;  Surgeon: Bridgette Campus, MD;  Location: [facility] INVASIVE CV LAB;  Service: Cardiovascular;  Laterality: N/A;   CYSTOSCOPY WITH STENT PLACEMENT Bilateral 03/07/2019   Procedure: CYSTOSCOPY WITH BILATERAL FIREFLY INJECTION;  Surgeon: Andrez Banker, MD;  Location: [facility]S;  Service: Urology;  Laterality: Bilateral;   GASTROSTOMY TUBE PLACEMENT  07/04/2004   IR - G tube for tonsilar cancer   IR ANGIO INTRA EXTRACRAN SEL COM CAROTID INNOMINATE UNI R MOD SED  03/09/2019   IR ANGIO VERTEBRAL SEL VERTEBRAL UNI R MOD SED  03/09/2019   IR CT HEAD LTD  03/09/2019   IR PERCUTANEOUS ART THROMBECTOMY/INFUSION INTRACRANIAL INC DIAG ANGIO  03/09/2019   IR RADIOLOGIST EVAL & MGMT  12/18/2018   LAMINECTOMY     C5/placement of steel plate   LOOP RECORDER INSERTION N/A 06/19/2023   Procedure: LOOP RECORDER INSERTION;  Surgeon: Boyce Byes, MD;  Location: [facility] INVASIVE CV LAB;  Service: Cardiovascular;  Laterality: N/A;   NECK SURGERY  2003   replaced disk   RADIOLOGY WITH ANESTHESIA N/A 03/08/2019   Procedure: IR WITH ANESTHESIA;  Surgeon: Luellen Sages,  MD;  Location: [facility] OR;  Service: Radiology;  Laterality: N/A;   TEE WITHOUT CARDIOVERSION N/A 10/15/2015   Procedure: TRANSESOPHAGEAL ECHOCARDIOGRAM (TEE);  Surgeon: Darlis Eisenmenger, MD;  Location: [facility] ENDOSCOPY;  Service: Cardiovascular;  Laterality: N/A;   TEE WITHOUT CARDIOVERSION N/A 11/24/2015   Procedure: TRANSESOPHAGEAL ECHOCARDIOGRAM (TEE);  Surgeon: Loyde Rule, MD;  Location: [facility] ENDOSCOPY;  Service: Cardiovascular;  Laterality: N/A;   TEE WITHOUT CARDIOVERSION N/A 12/20/2022   Procedure: TRANSESOPHAGEAL ECHOCARDIOGRAM;  Surgeon: Bridgette Campus, MD;  Location: [facility] INVASIVE CV LAB;  Service: Cardiovascular;  Laterality: N/A;   XI ROBOTIC ASSISTED COLOSTOMY TAKEDOWN N/A 03/07/2019   Procedure: XI ROBOTIC ASSISTED LOW ANTERIOR RESECTION, RIGID PROCTOSCOPY;  Surgeon: Candyce Champagne, MD;  Location: [facility] ORS;  Service: General;  Laterality: N/A;   Past Medical History:  Diagnosis Date   AF (paroxysmal atrial fibrillation) (HCC) 11/29/2018   Atrial flutter (HCC) 10/12/2015   a. TEE 3/17 with ? LAA clot-->s/p TEE/DCCV 11/24/2015; s/p DCCV 12/2022;s/p ablation 06/2023   CAD (coronary artery disease), native coronary artery 12/06/2015   a. 10/2015 MV: EF  37%, reversible defect inferior apex, intermediate risk findings; b. 10/2015 Cath: 20% mid RCA.   Carotid artery stenosis    1-39% right ICA stenosis and occluded left ICA   Colonic diverticular abscess    Diverticulitis    History of chemotherapy 2005   Cisplatin   Hypothyroidism    NICM (nonischemic cardiomyopathy) (HCC) 10/14/2015   a. Tachy mediated?;  b. Echo 3/17 - Mild concentric LVH, EF 30-35%, anteroseptal, anterior, anterolateral, apical anterior, lateral hypokinesis, trivial MR, mild to moderately reduced RVSF; c. LHC 3/17 - mRCA 20%   Radiation NOv.3,2005-Dec. 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  XRT   BP 129/81   Pulse 67   Ht 5' 8 (1.727 m)   Wt 143 lb 9.6 oz (65.1 kg)   SpO2 96%    BMI 21.83 kg/m   Opioid Risk Score:   Fall Risk Score:  `1  Depression screen [place] 2/9     01/25/2024    3:11 PM 10/26/2023    2:39 PM 09/19/2023    2:11 PM 09/12/2023    4:17 PM 10/12/2015    9:30  AM  Depression screen PHQ 2/9  Decreased Interest 0 0 0 0 0  Down, Depressed, Hopeless 0 0 0 0 0  PHQ - 2 Score 0 0 0 0 0  Altered sleeping   0    Tired, decreased energy   0    Change in appetite   0    Feeling bad or failure about yourself    0    Trouble concentrating   0    Moving slowly or fidgety/restless   0    Suicidal thoughts   0    PHQ-9 Score   0    Difficult doing work/chores   Not difficult at all       Review of Systems  Musculoskeletal:  Positive for gait problem.  All other systems reviewed and are negative.      Objective:   Physical Exam  Motor strength is 4/5 in the right deltoid bicep tricep grip hip flexor knee extensor ankle dorsiflexor plantar flexor Left side strength is 5/5 in the same muscle groups Sensation patient feels that his similar bilateral upper and lower limbs to light touch Ambulates with a stifflegged gait in the right AFO no evidence of toe drag or knee instability Speech without dysarthria he does have mild word finding deficits Positive dysdiadochokinesis with rapid Alternating supination pronation of the right upper extremity Finger-nose-finger testing with minimal dysmetria right finger-nose-finger. Tone is mildly increased in the right upper extremity he does have hyperactive reflexes of the right lower extremity      Assessment & Plan:  1.  Left basal ganglia hemorrhage with residual mild right hemiparesis.  He also has motor control issues he does have some mildly increased tone but no severe spasticity.  We discussed that he is made very good recovery and is expected to make some further improvements although at a much slower rate at this point.  He has been encouraged to continue therapy as well as home exercise program. We  discussed that he may experience increasing spasticity over time even months or years after stroke.  I would like to see him back in 6 months.  He is to call if he feels like pain or spasticity worsen prior to the next appointment We discussed that if he has a sudden worsening of his weakness that he should call 911 He will also follow-up with his primary care physician as well as neurology

## 2024-01-28 ENCOUNTER — Other Ambulatory Visit: Payer: Self-pay

## 2024-01-28 ENCOUNTER — Ambulatory Visit

## 2024-01-28 DIAGNOSIS — R278 Other lack of coordination: Secondary | ICD-10-CM

## 2024-01-28 DIAGNOSIS — R2681 Unsteadiness on feet: Secondary | ICD-10-CM

## 2024-01-28 DIAGNOSIS — M6281 Muscle weakness (generalized): Secondary | ICD-10-CM

## 2024-01-28 DIAGNOSIS — R262 Difficulty in walking, not elsewhere classified: Secondary | ICD-10-CM

## 2024-01-28 DIAGNOSIS — R2689 Other abnormalities of gait and mobility: Secondary | ICD-10-CM

## 2024-01-28 NOTE — Therapy (Signed)
 OUTPATIENT PHYSICAL THERAPY TREATMENT    Patient Name: Adrian [name] MRN: [id number] DOB:1955-12-22, 68 y.o., male Today's Date: 01/28/2024          END OF SESSION:  PT End of Session - 01/28/24 0934     Visit Number 22    Date for PT Re-Evaluation 02/29/24    Authorization Type MCR    Authorization Time Period 10/31/23 to 12/26/23; extended to 02/27/24    Progress Note Due on Visit 30    PT Start Time 0932    PT Stop Time 1015    PT Time Calculation (min) 43 min    Activity Tolerance Patient tolerated treatment well    Behavior During Therapy [facility] for tasks assessed/performed                               Past Medical History:  Diagnosis Date   AF (paroxysmal atrial fibrillation) (HCC) 11/29/2018   Atrial flutter (HCC) 10/12/2015   a. TEE 3/17 with ? LAA clot-->s/p TEE/DCCV 11/24/2015; s/p DCCV 12/2022;s/p ablation 06/2023   CAD (coronary artery disease), native coronary artery 12/06/2015   a. 10/2015 MV: EF  37%, reversible defect inferior apex, intermediate risk findings; b. 10/2015 Cath: 20% mid RCA.   Carotid artery stenosis    1-39% right ICA stenosis and occluded left ICA   Colonic diverticular abscess    Diverticulitis    History of chemotherapy 2005   Cisplatin   Hypothyroidism    NICM (nonischemic cardiomyopathy) (HCC) 10/14/2015   a. Tachy mediated?;  b. Echo 3/17 - Mild concentric LVH, EF 30-35%, anteroseptal, anterior, anterolateral, apical anterior, lateral hypokinesis, trivial MR, mild to moderately reduced RVSF; c. LHC 3/17 - mRCA 20%   Radiation NOv.3,2005-Dec. 15, 2005   6810 cGy in 30 fractions   Tonsil cancer [facility]) 2005   Dr Hughes.  XRT   Past Surgical History:  Procedure Laterality Date   A-FLUTTER ABLATION N/A 06/19/2023   Procedure: A-FLUTTER ABLATION;  Surgeon: Cindie Ole DASEN, MD;  Location: [facility] INVASIVE CV LAB;  Service: Cardiovascular;  Laterality: N/A;   APPENDECTOMY N/A 03/07/2019   Procedure: ROBOTIC ASSISTED  APPENDECTOMY;  Surgeon: Sheldon Standing, MD;  Location: [facility] ORS;  Service: General;  Laterality: N/A;   CARDIAC CATHETERIZATION N/A 10/18/2015   Procedure: Left Heart Cath and Coronary Angiography;  Surgeon: Lonni JONETTA Cash, MD;  Location: [facility] INVASIVE CV LAB;  Service: Cardiovascular;  Laterality: N/A;   CARDIOVERSION N/A 11/24/2015   Procedure: CARDIOVERSION;  Surgeon: Maude JAYSON Emmer, MD;  Location: [facility] ENDOSCOPY;  Service: Cardiovascular;  Laterality: N/A;   CARDIOVERSION N/A 12/20/2022   Procedure: CARDIOVERSION;  Surgeon: Alvan Ronal BRAVO, MD;  Location: [facility] INVASIVE CV LAB;  Service: Cardiovascular;  Laterality: N/A;   CYSTOSCOPY WITH STENT PLACEMENT Bilateral 03/07/2019   Procedure: CYSTOSCOPY WITH BILATERAL FIREFLY INJECTION;  Surgeon: Cam Morene ORN, MD;  Location: [facility] ORS;  Service: Urology;  Laterality: Bilateral;   GASTROSTOMY TUBE PLACEMENT  07/04/2004   IR - G tube for tonsilar cancer   IR ANGIO INTRA EXTRACRAN SEL COM CAROTID INNOMINATE UNI R MOD SED  03/09/2019   IR ANGIO VERTEBRAL SEL VERTEBRAL UNI R MOD SED  03/09/2019   IR CT HEAD LTD  03/09/2019   IR PERCUTANEOUS ART THROMBECTOMY/INFUSION INTRACRANIAL INC DIAG ANGIO  03/09/2019   IR RADIOLOGIST EVAL & MGMT  12/18/2018   LAMINECTOMY     C5/placement of steel plate   LOOP RECORDER INSERTION  N/A 06/19/2023   Procedure: LOOP RECORDER INSERTION;  Surgeon: Cindie Ole DASEN, MD;  Location: [facility] INVASIVE CV LAB;  Service: Cardiovascular;  Laterality: N/A;   NECK SURGERY  2003   replaced disk   RADIOLOGY WITH ANESTHESIA N/A 03/08/2019   Procedure: IR WITH ANESTHESIA;  Surgeon: Dolphus Carrion, MD;  Location: [facility] OR;  Service: Radiology;  Laterality: N/A;   TEE WITHOUT CARDIOVERSION N/A 10/15/2015   Procedure: TRANSESOPHAGEAL ECHOCARDIOGRAM (TEE);  Surgeon: Ezra GORMAN Shuck, MD;  Location: [facility] ENDOSCOPY;  Service: Cardiovascular;  Laterality: N/A;   TEE WITHOUT CARDIOVERSION N/A 11/24/2015   Procedure: TRANSESOPHAGEAL ECHOCARDIOGRAM (TEE);   Surgeon: Maude JAYSON Emmer, MD;  Location: [facility] ENDOSCOPY;  Service: Cardiovascular;  Laterality: N/A;   TEE WITHOUT CARDIOVERSION N/A 12/20/2022   Procedure: TRANSESOPHAGEAL ECHOCARDIOGRAM;  Surgeon: Alvan Ronal BRAVO, MD;  Location: [facility] INVASIVE CV LAB;  Service: Cardiovascular;  Laterality: N/A;   XI ROBOTIC ASSISTED COLOSTOMY TAKEDOWN N/A 03/07/2019   Procedure: XI ROBOTIC ASSISTED LOW ANTERIOR RESECTION, RIGID PROCTOSCOPY;  Surgeon: Sheldon Standing, MD;  Location: [facility] ORS;  Service: General;  Laterality: N/A;   Patient Active Problem List   Diagnosis Date Noted   Right spastic hemiparesis (HCC) 01/25/2024   Carotid occlusion, left 10/23/2023   ICH (intracerebral hemorrhage) (HCC) 08/11/2023   Carotid artery stenosis    Atrial flutter (HCC) 12/19/2022   Essential hypertension 04/22/2021   Hypokalemia 03/12/2019   Expressive aphasia 03/10/2019   Dysphagia 03/10/2019   Middle cerebral artery embolism, left 03/09/2019   Diverticular stricture (HCC) 03/07/2019   Bacteria in urine    Chronic atrial fibrillation (HCC) 12/02/2018   Current use of long term anticoagulation 12/02/2018   UTI (urinary tract infection) 11/29/2018   Hyponatremia 11/29/2018   PAF (paroxysmal atrial fibrillation) (HCC) 11/29/2018   Diverticulitis of large intestine with abscess 09/23/2018   Dyslipidemia 12/05/2017   CAD (coronary artery disease), native coronary artery 12/06/2015   SOB (shortness of breath)    NICM (nonischemic cardiomyopathy) (HCC)    Thyroid  activity decreased    Hypothyroidism 08/12/2013   History of radiation therapy 05/30/2012   Smokes tobacco daily 05/30/2012   Cancer of tonsillar fossa (HCC) 11/27/2011   Tonsil cancer (HCC) 2005   History of chemotherapy 2005    PCP: Micheal Pin MD   REFERRING PROVIDER: Carilyn Prentice BRAVO, MD  REFERRING DIAG: I61.0 (ICD-10-CM) - Nontraumatic subcortical hemorrhage of left cerebral hemisphere [facility])  THERAPY DIAG:  Muscle weakness  (generalized)  Other abnormalities of gait and mobility  Unsteadiness on feet  Other lack of coordination  Difficulty in walking, not elsewhere classified  Rationale for Evaluation and Treatment: Rehabilitation  ONSET DATE: 08/11/23  SUBJECTIVE:   SUBJECTIVE STATEMENT:  Nothing new , saw his PCP no new meds or changes. States today he would like to go over some of the things he should do at the gym     PERTINENT HISTORY: 1.  History of left basal ganglia hemorrhage approximately 2 and half months ago.  He has completed inpatient rehabilitation as well as home health.  I do think he would be a great candidate for outpatient therapy.  He has gotten back to modified independent level but still has some mild weakness on the right side as well as fine motor issues and motor control issues.  His balance is also off.  He continues wear right AFO because of foot and ankle weakness.  Patient has some short-term memory issues as well Will make referral for outpatient PT OT and speech  therapy Physical medicine rehab follow-up in 3 months  PAIN:  Are you having pain? No  0/10 today  PRECAUTIONS: Fall and Other: R hemi, has loop recorder   RED FLAGS: None   WEIGHT BEARING RESTRICTIONS: No  FALLS:  Has patient fallen in last 6 months? No  LIVING ENVIRONMENT: Lives with: lives alone Lives in: House/apartment Stairs: 2 STE with rail  Has following equipment at home: Single point cane and Environmental consultant - 2 wheeled  OCCUPATION: retired- used to work for [employer]   PLOF: Independent, Independent with basic ADLs, Independent with gait, Independent with transfers, and Requires assistive device for independence  PATIENT GOALS: walk straight without a cane, get back to driving and motorcycling   NEXT MD VISIT: Referring 01/25/24  OBJECTIVE:  Note: Objective measures were completed at Evaluation unless otherwise noted.  DIAGNOSTIC FINDINGS:   CLINICAL DATA:  Stroke, follow up Neuro deficit,  acute, stroke suspected   EXAM: MRI HEAD WITHOUT AND WITH CONTRAST   TECHNIQUE: Multiplanar, multiecho pulse sequences of the brain and surrounding structures were obtained without and with intravenous contrast.   CONTRAST:  6.5mL GADAVIST  GADOBUTROL  1 MMOL/ML IV SOLN   COMPARISON:  CT head from today.   FINDINGS: Brain: When comparing across modalities, no substantial change in intraparenchymal hemorrhage in the left basal ganglia with intraventricular extension. No visible surrounding acute hemorrhage or mass lesion; however, acute blood products limits assessment. Prior left frontal infarct with encephalomalacia. No hydrocephalus. No pathologic enhancement.   Vascular: Major arterial flow voids are maintained at the skull base.   Skull and upper cervical spine: Normal marrow signal.   Sinuses/Orbits: Right frontal sinus opacification. Remaining sinuses are clear. No acute orbital findings.   Other: No mastoid effusions.   IMPRESSION: 1. When comparing across modalities, no substantial change in intraparenchymal hemorrhage in the left basal ganglia with intraventricular extension. No progressive mass effect. 2. No visible surrounding acute hemorrhage or mass lesion; however, acute blood products limits assessment  EXAM: CT HEAD WITHOUT CONTRAST   TECHNIQUE: Contiguous axial images were obtained from the base of the skull through the vertex without intravenous contrast.   RADIATION DOSE REDUCTION: This exam was performed according to the departmental dose-optimization program which includes automated exposure control, adjustment of the mA and/or kV according to patient size and/or use of iterative reconstruction technique.   COMPARISON:  Head CT 08/12/2023   FINDINGS: Brain: Interval decrease in size of the previously seen hemorrhage in the left basal ganglia, now measuring 10 x 7 mm, previously 20 x 12 mm. There is resolution of intraventricular blood  products. No hydrocephalus. No extra-axial fluid collection. No mass effect. No mass lesion. No CT evidence of an acute cortical infarct. There is chronic left MCA territory infarct involving the left opercular region.   Vascular: No hyperdense vessel or unexpected calcification.   Skull: Normal. Negative for fracture or focal lesion.   Sinuses/Orbits: No middle ear or mastoid effusion. Paranasal sinuses are notable for complete opacification of the right frontal sinus. Orbits are unremarkable.   Other: None.   IMPRESSION: No acute intracranial abnormality. Interval decrease in size of left basal ganglia hemorrhage, now measuring 10 x 7 mm, previously 20 x 12 mm. Resolution of intraventricular blood products. No hydrocephalus.    PATIENT SURVEYS:    Patient-Specific Activity Scoring Scheme  0 represents "unable to perform." 10 represents "able to perform at prior level. 0 1 2 3 4 5 6 7 8 9  10 (Date and Score)  Activity Eval  12/03/23  12/24/23 01/21/24  1. Walking straight without the cane  4   6 6-7 6  2. Strength in RLE   4 6  5 7   3. Coordinated movements of R LE  3 6 3 5   4.      5.      Score 3.7 6 4.6 6   Total score = sum of the activity scores/number of activities Minimum detectable change (90%CI) for average score = 2 points Minimum detectable change (90%CI) for single activity score = 3 points     COGNITION: Overall cognitive status: Within functional limits for tasks assessed     SENSATION: Not tested      LOWER EXTREMITY MMT:  MMT Right eval Left eval 01/02/24 R  01/21/24 R  Hip flexion 4 5 4+ 4  Hip extension      Hip abduction      Hip adduction      Hip internal rotation      Hip external rotation      Knee flexion 4 5 4+ 4+  Knee extension 4 5 4+ 4+  Ankle dorsiflexion 1 at best (in AFO, reports no movement in ankle)  DNT, in AFO  DNT  Ankle plantarflexion      Ankle inversion      Ankle eversion       (Blank rows = not  tested)    FUNCTIONAL TESTS:  5 times sit to stand: 12.8 seconds some off shift from R LE  Timed up and go (TUG): deferred  3 minute walk test: 231ft [facility]      01/14/24 0001  Dynamic Gait Index  Level Surface 2  Change in Gait Speed 2  Gait with Horizontal Head Turns 2  Gait with Vertical Head Turns 2  Gait and Pivot Turn 1  Step Over Obstacle 1  Step Around Obstacles 2  Steps 1  Total Score 13  DGI comment: fatigued today, didn't sleep well      GAIT: Distance walked: 253ft  Assistive device utilized: Single point cane Level of assistance: Modified independence Comments: wide BOS, increased tone in R LE with gait pattern- limited knee ROM and ankle tends to invert a bit , mild unsteadiness with corners, limited rotation at trunk/hips                                                                                                                                 TREATMENT DATE:  01/28/24 Out of AFO today: Recumbent cycle for 6 min, level 4  Leg press 30# B Le 3 x 10 Long arc quads 15 # 3 x 10 Hamstring curls 35# 3 x 10 In ll bars, for lunges with R LE on to center of compliant surface of BOSU In ll bars, L Le on center of BOSU, R LE on ground, with reaches B Ue's across body , up/ down In ll bars side/side  rocking on rocker board  01/23/24  Out of AFO: Forward lunges onto BOSU x10 B Lateral lunges onto BOSU x10 B  Nustep L7 x4 minutes BLEs only, then L6x4 more minutes  Gait outdoors: curbs with Min cues for sequencing/safety, hills, grass/mulch in brace      01/21/24  MMT, PSFS, goal check, education on progress with PT and transition from 6 month window to a year after a CVA, likely benefit of transition to long term HEP, if MD changes any meds or spasticity management significantly could consider extension of PT    Tandem walk x4 laps // bars Side steps in // bars x4 laps  Forward lunges onto BOSU x10 B light BUE support   Nustep L6x6 minutes BLEs  only    01/16/24  AFO off in // bars/NMR for hemi-ankle:  - mini-lunge onto blue air pad x12  - assisted PF/DF on rockerboard with assist from L LE and BUE support (tried to have R ankle do approximately 75% of effort) x10 had issues with knee wanting to buckle without AFO on  - assisted inversion/eversion on rockerboard with assist from L LE and BUE support (R ankle doing about 75% of effort), less knee flexion moment  Nustep L6x8 minutes BLEs only  Gait outside focus on hill navigation (L side of parking lot) cane and general S/occasional Min guard, did need MinA/Min cues for safe curb navigation        01/14/24   Gait without AFO- continued to note ongoing inversion tone, pt also reports toes curling at times. When he is distracted, does have reduced ankle DF but did not catch foot or trip. Education on AFO, could consider going back by [employer] to see if they feel if his brace needs modification (different build to help control inversion?), encouraged AFO wear out of house to avoid toe catching in busy environments  Nustep L6x8 minutes BLEs only, seat 7 DGI  Sidesteps blue foam pad x3 laps min guard  Cross midline reaches on blue foam pad randomized order min guard   Heel-toe rocks R LE on blue air pad            PATIENT EDUCATION:  Education details: exam findings, POC, HEP, otherwise as above  Person educated: Patient Education method: Programmer, multimedia, Facilities manager, and Handouts Education comprehension: verbalized understanding, returned demonstration, and needs further education  HOME EXERCISE PROGRAM:  Access Code: [email or web address] URL: [email or web address] Date: 01/21/2024 Prepared by: Josette Rough  Exercises - Heel Toe Raises with Counter Support  - 1 x daily - 7 x weekly - 2 sets - 10 reps - Stride Stance Weight Shift  - 1 x daily - 7 x weekly - 2 sets - 10 reps - Single Leg Balance with Clock Reach  - 1 x daily - 7 x weekly - 2 sets - 10  reps - Sit to Stand with Resistance Around Legs  - 1 x daily - 7 x weekly - 2 sets - 10 reps - Forward Step Over with Counter Support  - 1 x daily - 7 x weekly - 2 sets - 10 reps - Backward Step Over with Counter Support  - 1 x daily - 7 x weekly - 2 sets - 10 reps - Standing Tandem Balance with Counter Support  - 1 x daily - 7 x weekly - 1 sets - 6 reps - 30 seconds  hold - Tandem Walking with Counter Support  - 1 x daily - 7 x weekly - 1 sets -  3-4 reps - Side Stepping with Counter Support  - 1 x daily - 7 x weekly - 1 sets - 3-4 reps   ASSESSMENT:  CLINICAL IMPRESSION:  Today we worked on preparing for DC next session.  He does have a gym membership, again education in benefits of mod cardiovascular activity, 3 -5 x week , building up to 30 min a time, also bulk strengthening with the resistive equipment.  Also very specific balance/ motor control training for R LE. Advised him to wear AFO at gym due to stability needed for bike, and the resistive training. Will review again next session and DC             From eval: Patient is a 68 y.o. M who was seen today for physical therapy evaluation and treatment for I61.0 (ICD-10-CM) - Nontraumatic subcortical hemorrhage of left cerebral hemisphere [facility]). He is very motivated and moving a very high level given the hemorrhagic CVA. His main concerns are balance/coordination and getting back to walking without assistive device. Anticipate he will do very well with skilled PT services.   OBJECTIVE IMPAIRMENTS: Abnormal gait, decreased activity tolerance, decreased balance, decreased coordination, decreased knowledge of use of DME, decreased mobility, difficulty walking, decreased strength, and decreased safety awareness.   ACTIVITY LIMITATIONS: standing, squatting, stairs, transfers, and locomotion level  PARTICIPATION LIMITATIONS: driving, shopping, community activity, and yard work  PERSONAL FACTORS: Age, Behavior pattern, Fitness, Past/current  experiences, Social background, Time since onset of injury/illness/exacerbation, and Transportation are also affecting patient's functional outcome.   REHAB POTENTIAL: Good  CLINICAL DECISION MAKING: Evolving/moderate complexity  EVALUATION COMPLEXITY: Moderate   GOALS: Goals reviewed with patient? No  SHORT TERM GOALS: Target date: 01/30/2024     Will be compliant with appropriate progressive HEP  Baseline: Goal status: MET 11/28/23  2.  Will score 14/24 on DGI  Baseline: 10 Goal status: MET 11/28/23 15/24  3.  Will be able to perform all functional transfers without compensation strategies or offshift from hemi LE  Baseline:  Goal status:  01/21/24 MET during unofficial 5xSTS   4.  Will be able to ambulate household distances without AD, Mod(I) Baseline:  Goal status: PARTIALLY MET 01/21/24 75% of the time     LONG TERM GOALS: Target date: 02/27/2024      MMT in R LE to be at least 4+/5 in hip and knee musculature  Baseline:  Goal status: 01/21/24 PARTIALLY MET   2.  Will score 18/24 on DGI to show improved functional balance  Baseline:  Goal status: 01/21/24 ONGOING   3.  Will be able to walk at least 297ft over uneven/unsteady surfaces without device and no more than S/ 560ft over even surfaces no device Mod(I) Baseline:  Goal status: 01/21/24- PARTIALLY MET needs cane for unsteady surfaces   4.  Will be able to ascend and descend steps without rail and minimal compensation strategies  Baseline:  Goal status: ONGOING 01/21/24   5.  Will be compliant with advanced HEP vs gym based program at DC to maintain functional gains  Baseline:  Goal status: 12/24/23: has a [employer] membership and has not utlized yet  6.  PSFS to improve by at least 3 points  Baseline: 3.7 Goal status: ONGOING 01/21/24   PLAN:  PT FREQUENCY: 2x/week  PT DURATION: 8 weeks  PLANNED INTERVENTIONS: 97164- PT Re-evaluation, 97110-Therapeutic exercises, 97530- Therapeutic activity,  97112- Neuromuscular re-education, 97535- Self Care, [id number]- Manual therapy, [id number]- Gait training, [phone]- Orthotic Fit/training, [phone]- Aquatic Therapy,  Stair training, Taping, Dry Needling, Cryotherapy, and Moist heat  PLAN FOR NEXT SESSION: potentially planning for  2  more visits then DC to advanced HEP- but sees MD on 6/20, if any meds or spasticity management is changed could then consider extending POC, but otherwise DC. Keep working on outdoor navigation  Paton Crum [employer], [place], DPT, OCS 01/28/24 3:07 PM

## 2024-01-30 ENCOUNTER — Ambulatory Visit

## 2024-02-02 ENCOUNTER — Encounter (HOSPITAL_COMMUNITY): Payer: Self-pay | Admitting: Interventional Radiology

## 2024-02-16 ENCOUNTER — Other Ambulatory Visit: Payer: Self-pay | Admitting: Neurology

## 2024-02-18 NOTE — Telephone Encounter (Signed)
 LAST OV 11/06/23 LAST FILLED 02/11/24 QTY 30 DAY SUPPLY 30 days  NEXT OV 05/08/24 PROVIDER DR Rosemarie

## 2024-02-20 ENCOUNTER — Other Ambulatory Visit (HOSPITAL_COMMUNITY): Payer: Self-pay | Admitting: Pharmacist

## 2024-02-20 MED ORDER — STUDY - ASPIRE - ASPIRIN 81 MG OR PLACEBO TABLET (PI-SETHI): 81.0000 mg | ORAL_TABLET | Freq: Every day | ORAL | 0 refills | Status: DC

## 2024-02-20 MED ORDER — STUDY - ASPIRE - APIXABAN 5 MG OR PLACEBO TABLET (PI-SETHI): 5.0000 mg | ORAL_TABLET | Freq: Two times a day (BID) | ORAL | 0 refills | Status: DC

## 2024-02-21 ENCOUNTER — Ambulatory Visit (INDEPENDENT_AMBULATORY_CARE_PROVIDER_SITE_OTHER): Payer: Self-pay

## 2024-02-21 DIAGNOSIS — I4892 Unspecified atrial flutter: Secondary | ICD-10-CM | POA: Diagnosis not present

## 2024-02-27 LAB — CUP PACEART REMOTE DEVICE CHECK
Date Time Interrogation Session: 20250722100800
Implantable Pulse Generator Implant Date: 20241112
Pulse Gen Serial Number: 126890

## 2024-02-28 ENCOUNTER — Ambulatory Visit: Payer: Self-pay | Admitting: Cardiology

## 2024-02-28 NOTE — Progress Notes (Signed)
 Bsx Loop Recorder

## 2024-03-12 NOTE — Progress Notes (Signed)
 Carelink Summary Report / Loop Recorder

## 2024-03-19 ENCOUNTER — Telehealth: Payer: Self-pay | Admitting: Neurology

## 2024-03-19 NOTE — Telephone Encounter (Signed)
 Patient's wife said patient has no more refills for gabapentin  (NEURONTIN ) 300 MG capsule. Informed Ms. Ainley shows in his chart he has 3 refills. Advised her to call the pharmacy and if there are no refills to call back to do a medication refill request.

## 2024-03-19 NOTE — Telephone Encounter (Signed)
 Noted

## 2024-03-22 ENCOUNTER — Other Ambulatory Visit: Payer: Self-pay | Admitting: Physician Assistant

## 2024-03-22 ENCOUNTER — Other Ambulatory Visit: Payer: Self-pay | Admitting: Family Medicine

## 2024-03-24 ENCOUNTER — Ambulatory Visit (INDEPENDENT_AMBULATORY_CARE_PROVIDER_SITE_OTHER): Payer: Self-pay

## 2024-03-24 DIAGNOSIS — I4892 Unspecified atrial flutter: Secondary | ICD-10-CM

## 2024-03-26 LAB — CUP PACEART REMOTE DEVICE CHECK
Date Time Interrogation Session: 20250819111700
Implantable Pulse Generator Implant Date: 20241112
Pulse Gen Serial Number: 126890

## 2024-03-27 ENCOUNTER — Ambulatory Visit: Payer: Self-pay | Admitting: Cardiology

## 2024-04-24 ENCOUNTER — Ambulatory Visit (INDEPENDENT_AMBULATORY_CARE_PROVIDER_SITE_OTHER): Payer: Self-pay

## 2024-04-24 DIAGNOSIS — I4892 Unspecified atrial flutter: Secondary | ICD-10-CM

## 2024-04-28 LAB — CUP PACEART REMOTE DEVICE CHECK
Date Time Interrogation Session: 20250921083500
Implantable Pulse Generator Implant Date: 20241112
Pulse Gen Serial Number: 126890

## 2024-04-29 NOTE — Progress Notes (Signed)
 Remote Loop Recorder Transmission

## 2024-04-30 ENCOUNTER — Ambulatory Visit: Payer: Self-pay | Admitting: Cardiology

## 2024-04-30 NOTE — Progress Notes (Signed)
 Remote Loop Recorder Transmission

## 2024-05-08 ENCOUNTER — Encounter: Payer: Self-pay | Admitting: Neurology

## 2024-05-08 ENCOUNTER — Ambulatory Visit: Admitting: Neurology

## 2024-05-08 VITALS — BP 136/84 | HR 70 | Ht 68.0 in | Wt 142.2 lb

## 2024-05-08 DIAGNOSIS — G8110 Spastic hemiplegia affecting unspecified side: Secondary | ICD-10-CM

## 2024-05-08 DIAGNOSIS — I610 Nontraumatic intracerebral hemorrhage in hemisphere, subcortical: Secondary | ICD-10-CM | POA: Diagnosis not present

## 2024-05-08 NOTE — Progress Notes (Signed)
 [facility] [address] [place]. [place] [address] [phone]       OFFICE FOLLOW-UP NOTE  [name] Date of Birth:  1955-09-11 Medical Record Number:  [id number]   HPI:  Update 05/08/2024 : He returns for follow-up after last visit 6 months ago.  He states he is doing well.  He is participating in the ASPIRE stroke prevention study (Eliquis  versus aspirin ) and is tolerating study medication well without significant bruising or bleeding.  He states his blood pressure is well-controlled.  Today it is 136/84.  He remains on Lipitor  which is tolerating well without muscle aches and pains.  He ambulates using a cane outdoors in long distances though he can walk indoors pretty well without it.  He does have right leg weakness spasticity and foot drop and uses an AFO.  He does take gabapentin  at night which helps him sleep and spasticity.  He has no new complaints today. Update 11/06/2023 : [name] is a 68 year old Caucasian male seen today for office follow-up visit following recent admission for intracerebral hemorrhage January 2025.  Is accompanied by his wife.  History is obtained from them and review of electronic medical records.  I personally reviewed pertinent available imaging films in PACS.[name] is a 68 y.o. male with history of atrial fibrillation, on Eliquis , carotid artery stenosis, hypothyroidism who presented 1/4 with acute onset of right-sided weakness.  Patient called EMS and he was brought in as a code stroke.  Noted to be hypertensive up to 230 systolic per EMS. On exam at bridge patient exhibited right-sided facial droop, right arm and leg weakness, dysarthria.CT head showed a large basal ganglia ICH with IVH extension.CTA showed spot sign and blood pressure parameters were tightened to a goal of 110-130.Patient was admitted to 4 N. ICU for monitoring and strict blood pressure control.  ICH score was 1 on admission.  His Eliquis  was reversed with  Andexxa .  Blood pressure was tightly controlled.  MRI scan showed stable appearance of the basal ganglia hemorrhage without significant intra ventricular extension as hydrocephalus or mass effect.  2D echo showed ejection fraction of 60 to 65% with no LVH.  LDL cholesterol 117 mg percent.  Hemoglobin A1c was 5.6.  Patient was advised not to take any antithrombotics and to follow-up with me as an outpatient and to consider possible participation in the ASPIRE trial.  Patient already was started on Eliquis  as an outpatient and recently saw her cardiologist Dr. Cindie who questioned the safety and risk-benefit and patient is here today to see me for my opinion.  Patient is currently on  Eliquis  5 mg twice daily.  And tolerating it well without bruising or bleeding he continues to have right-sided weakness but it is improving.  He is able to use his right upper extremity fairly well.  He still has mild right foot drop and drags his right leg while walking.  He has finished 6 weeks of home physical occupational speech therapy and has started outpatient therapies last week.  He is able to do almost all activities of daily living for himself. Prior office notes 01/15/2020, [name] is being seen for follow-up regarding left MCA stroke in 03/2019.  He has been doing well since prior visit with improvement of his speech and only occasional word finding difficulty.  Denies new or worsening stroke/TIA symptoms.  He continues to work for [employer] without difficulty.  Continues on Eliquis  for secondary stroke prevention and atrial fibrillation  as well as atorvastatin  without side effects. Recent lab work showed LDL 50. Blood pressure today 134/75.  He continues to follow closely with PCP and cardiology. No stroke or neurological concerns at this time.     History provided for reference purposes only Update 07/15/2019 JM: [name] is a 68 year old male who is being seen today for stroke follow-up.  Residual deficits occasional  speech hesitancy or difficulty finding the correct word but overall improving.  He has also continued to experience decreased energy levels and decreased motivation but this has been slightly improving.  He has returned back to work without great difficulty working 7 to 8 hours/day.  He denies insomnia or snoring.  He has actually been working on weight gain and has gained 5 pounds since prior visit.  Continues on Eliquis  for history of atrial fibrillation and secondary stroke prevention without bleeding or bruising.  Continues on atorvastatin  without myalgias.  Blood pressure today 122/78.  No further concerns at this time.   Initial visit 04/15/2019 Dr. Rosemarie: [name] is a 68 year old Caucasian male seen today for initial office follow-up visit following hospital admission for stroke in August 2020.  History is obtained from the patient, his ex wife, review of electronic medical records and have personally reviewed imaging films in PACS.  He has past medical history of coronary artery disease, tonsillar cancer in remission, atrial fibrillation on anticoagulation which was held 5 days ago prior to admission for diverticulitis of large intestine with abscess requiring exploratory laparotomy with low anterior resection, rigid proctoscopy and appendicectomy.  He was last seen normal on 03/08/2019 at 7:30 PM and was still on hospital and he developed sudden onset of speech difficulties and speech became nonsensical he had trouble expressing himself.  Code stroke was activated his NIH stroke scale was 2 on assessment there.  Noncontrast CT scan of the head was negative for bleed TPA was not given due to recent surgery 1 day prior.  CT angiogram of the head and neck was obtained which showed left cervical carotid occlusion as well as left MCA occlusion and CT perfusion showed acute cord infarct involving the anterior left MCA distribution with moderate surrounding ischemic penumbra favorable for revascularization.   Patient was transferred to [facility] where he underwent mechanical thrombectomy of the occluded left ICA with revascularization but only partial revascularization of the left MCA.  MRI scan of the brain showed moderate size left MCA infarct with trace petechial hemorrhage with decreased left ICA flow void.  2D echo showed normal ejection fraction.  LDL cholesterol 46 mg percent.  Hemoglobin A1c was 5.5.  Patient is started on Eliquis  for his history of atrial fibrillation.  He was discharged home with outpatient physical occupational and speech therapy.     Patient states he is continuing therapies but is currently getting only speech and occupational therapy 1 day a week.  He still has some word finding difficulties particularly towards afternoon when he is tired he struggles to speak.  He is currently out on short-term disability to October 23 from his diverticulitis.  He wants to go back to work and will be working part-time 6 hours/day.  He does not involve a lot of speaking and talking in his job.  He is tolerating Eliquis  well without bruising or bleeding.  His blood pressure is well controlled and today it is 124/74.  He is tolerating Lipitor  without muscle aches and pains.  Patient continues to smoke but states he is willing to  quit soon.  He wants to return back to work on 05/25/2019.  ROS:   14 system review of systems is positive for weakness, negative walking, stiffness, bruising all other systems negative  PMH:  Past Medical History:  Diagnosis Date   AF (paroxysmal atrial fibrillation) (HCC) 11/29/2018   Atrial flutter (HCC) 10/12/2015   a. TEE 3/17 with ? LAA clot-->s/p TEE/DCCV 11/24/2015; s/p DCCV 12/2022;s/p ablation 06/2023   CAD (coronary artery disease), native coronary artery 12/06/2015   a. 10/2015 MV: EF  37%, reversible defect inferior apex, intermediate risk findings; b. 10/2015 Cath: 20% mid RCA.   Carotid artery stenosis    1-39% right ICA stenosis and occluded left  ICA   Colonic diverticular abscess    Diverticulitis    History of chemotherapy 2005   Cisplatin   Hypothyroidism    NICM (nonischemic cardiomyopathy) (HCC) 10/14/2015   a. Tachy mediated?;  b. Echo 3/17 - Mild concentric LVH, EF 30-35%, anteroseptal, anterior, anterolateral, apical anterior, lateral hypokinesis, trivial MR, mild to moderately reduced RVSF; c. LHC 3/17 - mRCA 20%   Radiation NOv.3,2005-Dec. 15, 2005   6810 cGy in 30 fractions   Tonsil cancer [facility]) 2005   Dr Hughes.  XRT    Social History:  Social History   Socioeconomic History   Marital status: Married    Spouse name: Not on file   Number of children: Not on file   Years of education: Not on file   Highest education level: Not on file  Occupational History   Not on file  Tobacco Use   Smoking status: Former    Current packs/day: 0.00    Average packs/day: 0.5 packs/day for 20.0 years (10.0 ttl pk-yrs)    Types: Cigarettes    Quit date: 08/11/2023    Years since quitting: 0.7   Smokeless tobacco: Never   Tobacco comments:    1/2 pack per day  Vaping Use   Vaping status: Never Used  Substance and Sexual Activity   Alcohol use: Yes    Alcohol/week: 12.0 standard drinks of alcohol    Types: 12 Cans of beer per week    Comment: couple of beers each day   Drug use: No   Sexual activity: Not on file  Other Topics Concern   Not on file  Social History Narrative   Not on file   Social Drivers of Health   Financial Resource Strain: Not on file  Food Insecurity: No Food Insecurity (08/11/2023)   Hunger Vital Sign    Worried About Running Out of Food in the Last Year: Never true    Ran Out of Food in the Last Year: Never true  Transportation Needs: No Transportation Needs (08/11/2023)   PRAPARE - Administrator, Civil Service (Medical): No    Lack of Transportation (Non-Medical): No  Physical Activity: Not on file  Stress: Not on file  Social Connections: Moderately Integrated (08/11/2023)    Social Connection and Isolation Panel    Frequency of Communication with Friends and Family: Three times a week    Frequency of Social Gatherings with Friends and Family: Once a week    Attends Religious Services: 1 to 4 times per year    Active Member of [employer] or Organizations: Yes    Attends Banker Meetings: 1 to 4 times per year    Marital Status: Divorced  Intimate Partner Violence: Not At Risk (08/11/2023)   Humiliation, Afraid, Rape, and Kick questionnaire  Fear of Current or Ex-Partner: No    Emotionally Abused: No    Physically Abused: No    Sexually Abused: No    Medications:   Current Outpatient Medications on File Prior to Visit  Medication Sig Dispense Refill   amLODipine  (NORVASC ) 2.5 MG tablet TAKE 1 TABLET(2.5 MG) BY MOUTH DAILY 90 tablet 1   atorvastatin  (LIPITOR ) 80 MG tablet Take 1 tablet (80 mg total) by mouth daily. 90 tablet 3   gabapentin  (NEURONTIN ) 300 MG capsule TAKE 1 CAPSULE(300 MG) BY MOUTH AT BEDTIME 30 capsule 3   levothyroxine  (SYNTHROID ) 137 MCG tablet Take 1 tablet (137 mcg total) by mouth daily before breakfast. 90 tablet 0   STUDY - ASPIRE - apixaban  5 mg or placebo tablet (PI-Pritesh Sobecki) Take 1 tablet (5 mg total) by mouth 2 (two) times daily. For Investigational use Only. 200 tablet 0   STUDY - ASPIRE - aspirin  81 mg or placebo tablet (PI-Cordarius Benning) Take 1 tablet (81 mg total) by mouth daily. For Investigational use Only 100 tablet 0   No current facility-administered medications on file prior to visit.    Allergies:  No Known Allergies  Physical Exam General: well developed, well nourished, seated, in no evident distress Head: head normocephalic and atraumatic.  Neck: supple with no carotid or supraclavicular bruits Cardiovascular: regular rate and rhythm, no murmurs Musculoskeletal: no deformity Skin:  no rash/petichiae Vascular:  Normal pulses all extremities Vitals:   05/08/24 1321  BP: 136/84  Pulse: 70   Neurologic  Exam Mental Status: Awake and fully alert. Oriented to place and time. Recent and remote memory intact. Attention span, concentration and fund of knowledge appropriate. Mood and affect appropriate.  Cranial Nerves: Fundoscopic exam reveals sharp disc margins. Pupils equal, briskly reactive to light. Extraocular movements full without nystagmus. Visual fields full to confrontation. Hearing intact. Facial sensation intact.  Mild right lower facial weakness.  Tongue, palate moves normally and symmetrically.  Motor: Spastic right hemiparesis with 4/5 strength with weakness of right grip and intrinsic hand muscles.  Orbits left or right upper extremity.  Mild weakness of right hip flexors and ankle dorsiflexors.  Tone is increased on the right compared to the left.   Sensory.: intact to touch ,pinprick .position and vibratory sensation.  Coordination: Rapid alternating movements normal in all extremities. Finger-to-nose and heel-to-shin performed accurately bilaterally. Gait and Station: Arises from chair without difficulty. Stance is normal. Gait is spastic hemiplegic with circumduction.  With right foot drop.  Unable to walk tandem. Reflexes: 2+ and asymmetric and brisker on the right. Toes downgoing.   NIHSS  1 Modified Rankin  2   ASSESSMENT: 68 year old Caucasian male with remote history of left MCA branch infarct in August 2020 due to left ICA occlusion treated with mechanical thrombectomy with revascularization.  History of atrial fibrillation was on long-term Eliquis  but developed left basal ganglia intracerebral hemorrhage in January 2025 now has residual spastic hemiparesis.  Vascular risk factors of atrial fibrillation, smoking and hyperlipidemia.     PLAN: I had a long patient with the patient about his remote intracerebral hemorrhage and atrial fibrillation and participation in the ASPIRE stroke prevention trial (Eliquis  versus aspirin ).  Continue ASPIRE stroke trial medication and maintain  strict blood pressure control with goal below 130/90.  Maintain aggressive risk factor modification with LDL cholesterol goal below 70 mg percent and hemoglobin A1c goal below 6.5%.  He was advised to use his cane when walking outdoors and long distances.  Return for follow-up in  the future in a year or call earlier if necessary.    I personally spent a total of 35 minutes in the care of the patient today including getting/reviewing separately obtained history, performing a medically appropriate exam/evaluation, counseling and educating, placing orders, referring and communicating with other health care professionals, documenting clinical information in the EHR, independently interpreting results, and coordinating care.        Eather Popp, MD Note: This document was prepared with digital dictation and possible smart phrase technology. Any transcriptional errors that result from this process are unintentional

## 2024-05-08 NOTE — Patient Instructions (Signed)
 I had a long patient with the patient about his remote intracerebral hemorrhage and atrial fibrillation and participation in the ASPIRE stroke prevention trial (Eliquis  versus aspirin ).  Continue ASPIRE stroke trial medication and maintain strict blood pressure control with goal below 130/90.  Maintain aggressive risk factor modification with LDL cholesterol goal below 70 mg percent and hemoglobin A1c goal below 6.5%.  He was advised to use his cane when walking outdoors and long distances.  Return for follow-up in the future in a year or call earlier if necessary.

## 2024-05-13 NOTE — Progress Notes (Signed)
 Remote Loop Recorder Transmission

## 2024-05-22 NOTE — Progress Notes (Signed)
   Electrophysiology Office Note:   Date:  05/23/2024  ID:  BRAM HOTTEL, DOB 27-May-1956, MRN [id number]  Primary Cardiologist: Wilbert Bihari, MD Electrophysiologist: OLE ONEIDA HOLTS, MD      History of Present Illness:   Adrian [name] is a 68 y.o. male with h/o  NICM, AFL, Non-obstructive CAD, AFL on eliquis , and diverticulitis seen today for routine electrophysiology followup.   Since last being seen in our clinic the patient reports doing very well. No arrhythmia of which he is aware of. Some brief episodes noted on his device, but asymptomatic. Wants to confirm he should remain on study med through Neurology.  BP up on arrival but was late and had trouble finding the building. Improved on recheck.  No chest pain, SOB, or edema.   Review of systems complete and found to be negative unless listed in HPI.   Studies Reviewed:    EKG is not ordered today. Loop recorder reviewed.    Presenting on Loop recorder 05/20/2024   Arrhythmia/Device History Boston Loop 06/19/2023 s/p Flutter ablation to follow burden   S/p AFL ablation 06/19/2023    Physical Exam:   VS:  BP (!) 146/78   Pulse 71   Ht 5' 8 (1.727 m)   Wt 140 lb 4.8 oz (63.6 kg)   SpO2 97%   BMI 21.33 kg/m    Wt Readings from Last 3 Encounters:  05/23/24 140 lb 4.8 oz (63.6 kg)  05/08/24 142 lb 3.2 oz (64.5 kg)  01/25/24 143 lb 9.6 oz (65.1 kg)     GEN: No acute distress NECK: No JVD; No carotid bruits CARDIAC: Regular rate and rhythm, no murmurs, rubs, gallops RESPIRATORY:  Clear to auscultation without rales, wheezing or rhonchi  ABDOMEN: Soft, non-tender, non-distended EXTREMITIES:  No edema; No deformity   ILR Interrogation- Most recent transmission reviewed in detail today.  ASSESSMENT AND PLAN:    Persistent Atrial Flutter s/p [employer] Loop recorder Normal device function No sustained recurrence off amiodarone  Given h/o CVA and CHA2DS2/VASc of at least 5, have recommended continuing ASPIRE  study drug.     NICM HFrecEF NYHA II symptoms Volume status stable.   HTN Stable on current regimen   Follow up with EP Team in 12 months  Signed, Ozell Prentice Passey, PA-C

## 2024-05-23 ENCOUNTER — Ambulatory Visit: Attending: Student | Admitting: Student

## 2024-05-23 ENCOUNTER — Encounter: Payer: Self-pay | Admitting: Student

## 2024-05-23 VITALS — BP 146/78 | HR 71 | Ht 68.0 in | Wt 140.3 lb

## 2024-05-23 DIAGNOSIS — I428 Other cardiomyopathies: Secondary | ICD-10-CM | POA: Insufficient documentation

## 2024-05-23 DIAGNOSIS — I4892 Unspecified atrial flutter: Secondary | ICD-10-CM | POA: Insufficient documentation

## 2024-05-23 DIAGNOSIS — I63419 Cerebral infarction due to embolism of unspecified middle cerebral artery: Secondary | ICD-10-CM | POA: Insufficient documentation

## 2024-05-23 NOTE — Patient Instructions (Signed)
 Medication Instructions:  Your physician recommends that you continue on your current medications as directed. Please refer to the Current Medication list given to you today.  *If you need a refill on your cardiac medications before your next appointment, please call your pharmacy*  Lab Work: None ordered If you have labs (blood work) drawn today and your tests are completely normal, you will receive your results only by: MyChart Message (if you have MyChart) OR A paper copy in the mail If you have any lab test that is abnormal or we need to change your treatment, we will call you to review the results.  Follow-Up: At [facility], you and your health needs are our priority.  As part of our continuing mission to provide you with exceptional heart care, our providers are all part of one team.  This team includes your primary Cardiologist (physician) and Advanced Practice Providers or APPs (Physician Assistants and Nurse Practitioners) who all work together to provide you with the care you need, when you need it.  Your next appointment:   November per recall  Provider:   Wilbert Bihari, MD  Your next appointment:   1 year(s)  Provider:   You may see OLE ONEIDA HOLTS, MD or one of the following Advanced Practice Providers on your designated Care Team:   Charlies Arthur, [place] Ozell Jodie Passey, PA-C Suzann Riddle, NP Daphne Barrack, NP Artist Pouch, PA-C

## 2024-05-26 ENCOUNTER — Other Ambulatory Visit (HOSPITAL_COMMUNITY): Payer: Self-pay | Admitting: Pharmacist

## 2024-05-26 ENCOUNTER — Ambulatory Visit (INDEPENDENT_AMBULATORY_CARE_PROVIDER_SITE_OTHER): Payer: Self-pay

## 2024-05-26 DIAGNOSIS — I4892 Unspecified atrial flutter: Secondary | ICD-10-CM

## 2024-05-26 MED ORDER — STUDY - ASPIRE - ASPIRIN 81 MG OR PLACEBO TABLET (PI-SETHI): 81.0000 mg | ORAL_TABLET | Freq: Every day | ORAL | 0 refills | Status: DC

## 2024-05-26 MED ORDER — STUDY - ASPIRE - APIXABAN 5 MG OR PLACEBO TABLET (PI-SETHI): 5.0000 mg | ORAL_TABLET | Freq: Two times a day (BID) | ORAL | 0 refills | Status: DC

## 2024-05-28 ENCOUNTER — Ambulatory Visit: Payer: Self-pay | Admitting: Cardiology

## 2024-05-28 LAB — CUP PACEART REMOTE DEVICE CHECK
Date Time Interrogation Session: 20251021100100
Implantable Pulse Generator Implant Date: 20241112
Pulse Gen Serial Number: 126890

## 2024-05-30 NOTE — Progress Notes (Signed)
 Remote Loop Recorder Transmission

## 2024-07-01 ENCOUNTER — Encounter: Payer: Self-pay | Admitting: Cardiology

## 2024-07-01 ENCOUNTER — Ambulatory Visit: Attending: Cardiology | Admitting: Cardiology

## 2024-07-01 VITALS — BP 132/70 | HR 76 | Ht 68.0 in | Wt 137.2 lb

## 2024-07-01 DIAGNOSIS — I428 Other cardiomyopathies: Secondary | ICD-10-CM | POA: Insufficient documentation

## 2024-07-01 DIAGNOSIS — I480 Paroxysmal atrial fibrillation: Secondary | ICD-10-CM | POA: Insufficient documentation

## 2024-07-01 DIAGNOSIS — I2510 Atherosclerotic heart disease of native coronary artery without angina pectoris: Secondary | ICD-10-CM | POA: Insufficient documentation

## 2024-07-01 DIAGNOSIS — Z79899 Other long term (current) drug therapy: Secondary | ICD-10-CM | POA: Diagnosis present

## 2024-07-01 DIAGNOSIS — E785 Hyperlipidemia, unspecified: Secondary | ICD-10-CM | POA: Diagnosis not present

## 2024-07-01 DIAGNOSIS — I63419 Cerebral infarction due to embolism of unspecified middle cerebral artery: Secondary | ICD-10-CM | POA: Diagnosis present

## 2024-07-01 DIAGNOSIS — I6523 Occlusion and stenosis of bilateral carotid arteries: Secondary | ICD-10-CM | POA: Diagnosis present

## 2024-07-01 NOTE — Patient Instructions (Signed)
 Medication Instructions:  Your physician recommends that you continue on your current medications as directed. Please refer to the Current Medication list given to you today.  *If you need a refill on your cardiac medications before your next appointment, please call your pharmacy*  Lab Work: Please complete a FASTING lipid panel and an ALT in our first floor lab before you leave.  If you have labs (blood work) drawn today and your tests are completely normal, you will receive your results only by: MyChart Message (if you have MyChart) OR A paper copy in the mail If you have any lab test that is abnormal or we need to change your treatment, we will call you to review the results.  Testing/Procedures: Your physician has requested that you have a carotid duplex. This test is an ultrasound of the carotid arteries in your neck. It looks at blood flow through these arteries that supply the brain with blood. Allow one hour for this exam. There are no restrictions or special instructions.   Follow-Up: At [facility], you and your health needs are our priority.  As part of our continuing mission to provide you with exceptional heart care, our providers are all part of one team.  This team includes your primary Cardiologist (physician) and Advanced Practice Providers or APPs (Physician Assistants and Nurse Practitioners) who all work together to provide you with the care you need, when you need it.  Your next appointment:   1 year(s)  Provider:   Wilbert Bihari, MD

## 2024-07-01 NOTE — Addendum Note (Signed)
 Addended by: JANIT GENI CROME on: 07/01/2024 02:11 PM   Modules accepted: Orders

## 2024-07-01 NOTE — Progress Notes (Signed)
 Cardiology Office Note:    Date:  07/01/2024   ID:  MCCOY TESTA, DOB 1955/08/19, MRN [id number]  PCP:  Carilyn Prentice BRAVO, MD  Cardiologist:  Wilbert Bihari, MD    Referring MD: Carilyn Prentice BRAVO, MD   Chief Complaint  Patient presents with   Atrial Fibrillation   Cardiomyopathy   Coronary Artery Disease   Hyperlipidemia    History of Present Illness:    Adrian [name] is a 68 y.o. male with a hx of atrial flutter in the setting of acute diverticulitis, nonischemic cardiomyopathy LVEF 30 to 35%, nonobstructive CAD on cath in 2017, PAF on Eliquis  for CHA2DS2-VASc of 2.  2D echo 03/09/2019 normal LVEF 60 to 65%.  He had recurrent atrial fibrillation underwent DCCV on 12/19/2022.  Unfortunately he reverted back to atrial flutter with RVR.  He was set up for repeat cardioversion but on arrival for procedure he was back in sinus bradycardia.  He was referred to EP and underwent a flutter ablation along with implantation of ILR for A-fib surveillance.  He is now followed in A-fib clinic.  He was seen in our clinic on 05/23/2024 and was doing well.  He does have a Best [employer] recorder in with normal device function and no sustained a flutter off Amio.  His CHA2DS2-VASc score is at least 5 due to history of CVA in the past and he is now on the aspire study drug.  He is here today is doing well.  He denies any chest pain or shortness of breath, PND, orthopnea, lower extremity edema, dizziness, palpitations or syncope.  Past Medical History:  Diagnosis Date   AF (paroxysmal atrial fibrillation) (HCC) 11/29/2018   Atrial flutter (HCC) 10/12/2015   a. TEE 3/17 with ? LAA clot-->s/p TEE/DCCV 11/24/2015; s/p DCCV 12/2022;s/p ablation 06/2023   CAD (coronary artery disease), native coronary artery 12/06/2015   a. 10/2015 MV: EF  37%, reversible defect inferior apex, intermediate risk findings; b. 10/2015 Cath: 20% mid RCA.   Carotid artery stenosis    1-39% right ICA stenosis and occluded  left ICA   Colonic diverticular abscess    Diverticulitis    History of chemotherapy 2005   Cisplatin   Hypothyroidism    NICM (nonischemic cardiomyopathy) (HCC) 10/14/2015   a. Tachy mediated?;  b. Echo 3/17 - Mild concentric LVH, EF 30-35%, anteroseptal, anterior, anterolateral, apical anterior, lateral hypokinesis, trivial MR, mild to moderately reduced RVSF; c. LHC 3/17 - mRCA 20%   Radiation NOv.3,2005-Dec. 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  XRT    Past Surgical History:  Procedure Laterality Date   A-FLUTTER ABLATION N/A 06/19/2023   Procedure: A-FLUTTER ABLATION;  Surgeon: Cindie Ole DASEN, MD;  Location: [facility] INVASIVE CV LAB;  Service: Cardiovascular;  Laterality: N/A;   APPENDECTOMY N/A 03/07/2019   Procedure: ROBOTIC ASSISTED APPENDECTOMY;  Surgeon: Sheldon Standing, MD;  Location: [facility] ORS;  Service: General;  Laterality: N/A;   CARDIAC CATHETERIZATION N/A 10/18/2015   Procedure: Left Heart Cath and Coronary Angiography;  Surgeon: Lonni JONETTA Cash, MD;  Location: [facility] INVASIVE CV LAB;  Service: Cardiovascular;  Laterality: N/A;   CARDIOVERSION N/A 11/24/2015   Procedure: CARDIOVERSION;  Surgeon: Maude JAYSON Emmer, MD;  Location: [facility] ENDOSCOPY;  Service: Cardiovascular;  Laterality: N/A;   CARDIOVERSION N/A 12/20/2022   Procedure: CARDIOVERSION;  Surgeon: Alvan Ronal BRAVO, MD;  Location: [facility] INVASIVE CV LAB;  Service: Cardiovascular;  Laterality: N/A;  CYSTOSCOPY WITH STENT PLACEMENT Bilateral 03/07/2019   Procedure: CYSTOSCOPY WITH BILATERAL FIREFLY INJECTION;  Surgeon: Cam Morene ORN, MD;  Location: [facility] ORS;  Service: Urology;  Laterality: Bilateral;   GASTROSTOMY TUBE PLACEMENT  07/04/2004   IR - G tube for tonsilar cancer   IR ANGIO INTRA EXTRACRAN SEL COM CAROTID INNOMINATE UNI R MOD SED  03/09/2019   IR ANGIO VERTEBRAL SEL VERTEBRAL UNI R MOD SED  03/09/2019   IR CT HEAD LTD  03/09/2019   IR PERCUTANEOUS ART THROMBECTOMY/INFUSION  INTRACRANIAL INC DIAG ANGIO  03/09/2019   IR RADIOLOGIST EVAL & MGMT  12/18/2018   LAMINECTOMY     C5/placement of steel plate   LOOP RECORDER INSERTION N/A 06/19/2023   Procedure: LOOP RECORDER INSERTION;  Surgeon: Cindie Ole DASEN, MD;  Location: [facility] INVASIVE CV LAB;  Service: Cardiovascular;  Laterality: N/A;   NECK SURGERY  2003   replaced disk   RADIOLOGY WITH ANESTHESIA N/A 03/08/2019   Procedure: IR WITH ANESTHESIA;  Surgeon: Dolphus Carrion, MD;  Location: [facility] OR;  Service: Radiology;  Laterality: N/A;   TEE WITHOUT CARDIOVERSION N/A 10/15/2015   Procedure: TRANSESOPHAGEAL ECHOCARDIOGRAM (TEE);  Surgeon: Ezra GORMAN Shuck, MD;  Location: [facility] ENDOSCOPY;  Service: Cardiovascular;  Laterality: N/A;   TEE WITHOUT CARDIOVERSION N/A 11/24/2015   Procedure: TRANSESOPHAGEAL ECHOCARDIOGRAM (TEE);  Surgeon: Maude JAYSON Emmer, MD;  Location: [facility] ENDOSCOPY;  Service: Cardiovascular;  Laterality: N/A;   TEE WITHOUT CARDIOVERSION N/A 12/20/2022   Procedure: TRANSESOPHAGEAL ECHOCARDIOGRAM;  Surgeon: Alvan Ronal BRAVO, MD;  Location: [facility] INVASIVE CV LAB;  Service: Cardiovascular;  Laterality: N/A;   XI ROBOTIC ASSISTED COLOSTOMY TAKEDOWN N/A 03/07/2019   Procedure: XI ROBOTIC ASSISTED LOW ANTERIOR RESECTION, RIGID PROCTOSCOPY;  Surgeon: Sheldon Standing, MD;  Location: [facility] ORS;  Service: General;  Laterality: N/A;    Current Medications: Current Meds  Medication Sig   amLODipine  (NORVASC ) 2.5 MG tablet TAKE 1 TABLET(2.5 MG) BY MOUTH DAILY   atorvastatin  (LIPITOR ) 80 MG tablet Take 1 tablet (80 mg total) by mouth daily.   gabapentin  (NEURONTIN ) 300 MG capsule TAKE 1 CAPSULE(300 MG) BY MOUTH AT BEDTIME   levothyroxine  (SYNTHROID ) 137 MCG tablet Take 1 tablet (137 mcg total) by mouth daily before breakfast.   metoprolol  tartrate (LOPRESSOR ) 50 MG tablet Take 50 mg by mouth 2 (two) times daily.   STUDY - ASPIRE - apixaban  5 mg or placebo tablet (PI-Sethi) Take 1 tablet (5 mg total) by mouth 2 (two) times daily. For  Investigational use Only. Contact [facility] for questions regarding this medication.   STUDY - ASPIRE - aspirin  81 mg or placebo tablet (PI-Sethi) Take 1 tablet (81 mg total) by mouth daily. For Investigational use Only. Contact [facility] for questions about this medication.     Allergies:   Patient has no known allergies.   Social History   Socioeconomic History   Marital status: Married    Spouse name: Not on file   Number of children: Not on file   Years of education: Not on file   Highest education level: Not on file  Occupational History   Not on file  Tobacco Use   Smoking status: Former    Current packs/day: 0.00    Average packs/day: 0.5 packs/day for 20.0 years (10.0 ttl pk-yrs)    Types: Cigarettes    Quit date: 08/11/2023    Years since quitting: 0.8   Smokeless tobacco: Never   Tobacco comments:    1/2 pack per day  Vaping  Use   Vaping status: Never Used  Substance and Sexual Activity   Alcohol use: Yes    Alcohol/week: 12.0 standard drinks of alcohol    Types: 12 Cans of beer per week    Comment: couple of beers each day   Drug use: No   Sexual activity: Not on file  Other Topics Concern   Not on file  Social History Narrative   Not on file   Social Drivers of Health   Financial Resource Strain: Not on file  Food Insecurity: No Food Insecurity (08/11/2023)   Hunger Vital Sign    Worried About Running Out of Food in the Last Year: Never true    Ran Out of Food in the Last Year: Never true  Transportation Needs: No Transportation Needs (08/11/2023)   PRAPARE - Administrator, Civil Service (Medical): No    Lack of Transportation (Non-Medical): No  Physical Activity: Not on file  Stress: Not on file  Social Connections: Moderately Integrated (08/11/2023)   Social Connection and Isolation Panel    Frequency of Communication with Friends and Family: Three times a week    Frequency of Social Gatherings with  Friends and Family: Once a week    Attends Religious Services: 1 to 4 times per year    Active Member of [employer] or Organizations: Yes    Attends Banker Meetings: 1 to 4 times per year    Marital Status: Divorced     Family History: The patient's family history includes Cancer in his brother; Colon cancer in his father; Prostate cancer in his father; Skin cancer in his mother. There is no history of Esophageal cancer, Rectal cancer, or Stomach cancer.  ROS:   Please see the history of present illness.    Review of Systems  Musculoskeletal:  Negative for gout.    All other systems reviewed and negative.   EKGs/Labs/Other Studies Reviewed:    The following studies were reviewed today: none   Recent Labs: 09/03/2023: Hemoglobin 14.4; Platelets 152 09/24/2023: ALT 16; BUN 19; Creatinine, Ser 1.04; Potassium 4.3; Sodium 135 10/17/2023: TSH 3.60   Recent Lipid Panel    Component Value Date/Time   CHOL 146 09/24/2023 1614   CHOL 140 02/02/2023 1055   TRIG 97.0 09/24/2023 1614   HDL 48.10 09/24/2023 1614   HDL 58 02/02/2023 1055   CHOLHDL 3 09/24/2023 1614   VLDL 19.4 09/24/2023 1614   LDLCALC 79 09/24/2023 1614   LDLCALC 68 02/02/2023 1055   LDLDIRECT 143.3 06/20/2012 1148    Physical Exam:    VS:  BP 132/70   Pulse 76   Ht 5' 8 (1.727 m)   Wt 137 lb 3.2 oz (62.2 kg)   SpO2 97%   BMI 20.86 kg/m     Wt Readings from Last 3 Encounters:  07/01/24 137 lb 3.2 oz (62.2 kg)  05/23/24 140 lb 4.8 oz (63.6 kg)  05/08/24 142 lb 3.2 oz (64.5 kg)    GEN: Well nourished, well developed in no acute distress HEENT: Normal NECK: No JVD; No carotid bruits LYMPHATICS: No lymphadenopathy CARDIAC:RRR, no murmurs, rubs, gallops RESPIRATORY:  Clear to auscultation without rales, wheezing or rhonchi  ABDOMEN: Soft, non-tender, non-distended MUSCULOSKELETAL:  No edema; No deformity  SKIN: Warm and dry NEUROLOGIC:  Alert and oriented x 3 PSYCHIATRIC:  Normal affect   ASSESSMENT:    1. PAF (paroxysmal atrial fibrillation) (HCC)   2. NICM (nonischemic cardiomyopathy) (HCC)   3. Coronary artery  disease involving native heart without angina pectoris, unspecified vessel or lesion type   4. Hyperlipidemia LDL goal <70   5. Bilateral carotid artery stenosis   6. Cerebrovascular accident (CVA) due to embolism of middle cerebral artery, unspecified blood vessel laterality (HCC)      PLAN:    In order of problems listed above:  1.  PAF -s/p TEE 3/17 with LAA thrombus -s/p DCCV 11/2015, 5/14 2024 and a flutter ablation on 06/19/2023 -Remains in normal sinus rhythm today and denies any palpitations -He is in the ASPiRE study drug so we do not know if he is receiving apixaban  or placebo -Continue Lopressor  50 mg twice daily -Continue follow-up with A-fib clinic  2.  NIDCM -felt tachy related -no significant CAD on cath 2017 -echo with EF 60-65% 03/2019 -TEE 12/20/2022 showed EF 40 to 45% -Follow-up 2D echo 07/09/2023 showed EF 60-65% with normal diastolic function, normal RV -Continue Lopressor  50 mg twice daily  3.  ASCAD -mild nonobstructive CAD by cath 2017 -He has not had any anginal symptoms -no ASA due to possibility of being on DOAC and his study -Continue atorvastatin  80 mg daily  4.  HLD -LDL goal < 70 -I have personally reviewed and interpreted outside labs performed by patient's PCP which showed LDL 79, HDL 48 on 09/24/2023 -Repeat FLP -Continue atorvastatin  80 mg daily with as needed refills  5.  Carotid artery disease - Carotid Dopplers 07/27/2023 showed 1 to 39% right carotid artery stenosis and total occlusion of the left ICA with right subclavian artery stenosis -denies any right arm tingling or pain but still weak some from the CVA -repeat dopplers  to make sure no progression of the right side  6.  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  Concerns regarding medicines are outlined above.  No orders of the defined types were placed in this encounter.  No orders of the defined types were placed in this encounter.   Signed, Wilbert Bihari, MD  07/01/2024 2:00 PM    [facility]

## 2024-07-02 LAB — LIPID PANEL
Chol/HDL Ratio: 2.8 ratio (ref 0.0–5.0)
Cholesterol, Total: 125 mg/dL (ref 100–199)
HDL: 45 mg/dL (ref >39)
LDL Chol Calc (NIH): 65 mg/dL (ref 0–99)
Triglycerides: 77 mg/dL (ref 0–149)
VLDL Cholesterol Cal: 15 mg/dL (ref 5–40)

## 2024-07-02 LAB — ALT: ALT: 18 IU/L (ref 0–44)

## 2024-07-04 ENCOUNTER — Ambulatory Visit: Attending: Cardiology

## 2024-07-04 ENCOUNTER — Ambulatory Visit: Payer: Self-pay | Admitting: Cardiology

## 2024-07-04 DIAGNOSIS — I6522 Occlusion and stenosis of left carotid artery: Secondary | ICD-10-CM

## 2024-07-04 DIAGNOSIS — I6521 Occlusion and stenosis of right carotid artery: Secondary | ICD-10-CM

## 2024-07-04 DIAGNOSIS — I480 Paroxysmal atrial fibrillation: Secondary | ICD-10-CM | POA: Diagnosis not present

## 2024-07-07 LAB — CUP PACEART REMOTE DEVICE CHECK
Date Time Interrogation Session: 20251128141000
Implantable Pulse Generator Implant Date: 20241112
Pulse Gen Serial Number: 126890

## 2024-07-08 ENCOUNTER — Ambulatory Visit: Payer: Self-pay | Admitting: Cardiology

## 2024-07-09 NOTE — Progress Notes (Signed)
 Remote Loop Recorder Transmission

## 2024-07-11 NOTE — Telephone Encounter (Signed)
 Call to patient to advise lipids at goal. Patient agrees to continue current therapy. Labs forwarded to PCP.

## 2024-07-11 NOTE — Telephone Encounter (Signed)
-----   Message from Wilbert Bihari sent at 07/04/2024  9:35 PM EST ----- Lipids at goal continue current therapy and forward to PCP ----- Message ----- From: Interface, [facility] Lab Results In Sent: 07/02/2024   3:41 AM EST To: Wilbert JONELLE Bihari, MD

## 2024-07-14 ENCOUNTER — Ambulatory Visit (HOSPITAL_COMMUNITY): Admission: RE | Admit: 2024-07-14 | Discharge: 2024-07-14 | Attending: Cardiology | Admitting: Cardiology

## 2024-07-14 ENCOUNTER — Encounter: Payer: Self-pay | Admitting: Cardiology

## 2024-07-14 DIAGNOSIS — I63419 Cerebral infarction due to embolism of unspecified middle cerebral artery: Secondary | ICD-10-CM

## 2024-07-14 DIAGNOSIS — I6523 Occlusion and stenosis of bilateral carotid arteries: Secondary | ICD-10-CM

## 2024-07-15 NOTE — Telephone Encounter (Signed)
-----   Message from Wilbert Bihari sent at 07/14/2024  1:47 PM EST ----- Carotid dopplers showed 1-39% right ICA stenosis and total occlusion of left ICA - disturbed flow in right subclavian.   Please find out if he has any right arm pain, weakness  Repeat dopplers in 1 eyar ----- Message ----- From: Interface, [facility] Sent: 07/14/2024  12:18 PM EST To: Wilbert JONELLE Bihari, MD

## 2024-07-15 NOTE — Addendum Note (Signed)
 Addended by: Crespin Forstrom L on: 07/15/2024 05:33 PM   Modules accepted: Orders

## 2024-07-15 NOTE — Telephone Encounter (Signed)
 Call to patient to discuss that carotid dopplers showed 1-39% right ICA stenosis and total occlusion of left ICA - disturbed flow in right subclavian. Patient denies any right arm pain, weakness, agrees to repeat dopplers in 1 year.

## 2024-07-18 ENCOUNTER — Other Ambulatory Visit: Payer: Self-pay | Admitting: Neurology

## 2024-07-23 NOTE — Telephone Encounter (Signed)
 Last seen on 05/08/24 No 1 year follow up scheduled

## 2024-07-25 ENCOUNTER — Encounter: Attending: Physical Medicine & Rehabilitation | Admitting: Physical Medicine & Rehabilitation

## 2024-07-27 ENCOUNTER — Ambulatory Visit

## 2024-08-04 ENCOUNTER — Ambulatory Visit: Attending: Cardiology

## 2024-08-04 DIAGNOSIS — I63419 Cerebral infarction due to embolism of unspecified middle cerebral artery: Secondary | ICD-10-CM | POA: Diagnosis not present

## 2024-08-05 LAB — CUP PACEART REMOTE DEVICE CHECK
Date Time Interrogation Session: 20251230102800
Implantable Pulse Generator Implant Date: 20241112
Pulse Gen Serial Number: 126890

## 2024-08-10 ENCOUNTER — Ambulatory Visit: Payer: Self-pay | Admitting: Cardiology

## 2024-08-12 NOTE — Progress Notes (Signed)
 Remote Loop Recorder Transmission

## 2024-08-19 ENCOUNTER — Other Ambulatory Visit (HOSPITAL_COMMUNITY): Payer: Self-pay | Admitting: Pharmacist

## 2024-08-19 MED ORDER — STUDY - ASPIRE - APIXABAN 5 MG OR PLACEBO TABLET (PI-SETHI): 5.0000 mg | ORAL_TABLET | Freq: Two times a day (BID) | ORAL | 0 refills | Status: AC

## 2024-08-19 MED ORDER — STUDY - ASPIRE - ASPIRIN 81 MG OR PLACEBO TABLET (PI-SETHI): 81.0000 mg | ORAL_TABLET | Freq: Every day | ORAL | 0 refills | Status: AC

## 2024-08-27 ENCOUNTER — Ambulatory Visit

## 2024-09-04 ENCOUNTER — Ambulatory Visit: Attending: Cardiology

## 2024-09-04 DIAGNOSIS — I4892 Unspecified atrial flutter: Secondary | ICD-10-CM

## 2024-09-05 LAB — CUP PACEART REMOTE DEVICE CHECK
Date Time Interrogation Session: 20260129100900
Implantable Pulse Generator Implant Date: 20241112
Pulse Gen Serial Number: 126890

## 2024-09-09 NOTE — Progress Notes (Signed)
 Remote Loop Recorder Transmission

## 2024-09-27 ENCOUNTER — Ambulatory Visit

## 2024-10-05 ENCOUNTER — Ambulatory Visit

## 2024-10-28 ENCOUNTER — Ambulatory Visit

## 2024-11-05 ENCOUNTER — Ambulatory Visit

## 2024-11-28 ENCOUNTER — Ambulatory Visit

## 2024-12-06 ENCOUNTER — Ambulatory Visit

## 2024-12-30 ENCOUNTER — Ambulatory Visit

## 2025-01-06 ENCOUNTER — Ambulatory Visit

## 2025-01-29 ENCOUNTER — Ambulatory Visit

## 2025-02-07 ENCOUNTER — Ambulatory Visit

## 2025-03-01 ENCOUNTER — Ambulatory Visit

## 2025-03-09 ENCOUNTER — Ambulatory Visit

## 2025-04-01 ENCOUNTER — Ambulatory Visit

## 2025-04-09 ENCOUNTER — Ambulatory Visit

## 2025-05-02 ENCOUNTER — Ambulatory Visit
